# Patient Record
Sex: Female | Born: 1950
Health system: Southern US, Community
[De-identification: ages and names within clinical notes are randomized; demographics above are authoritative.]

## PROBLEM LIST (undated history)

## (undated) DIAGNOSIS — J189 Pneumonia, unspecified organism: Secondary | ICD-10-CM

## (undated) DIAGNOSIS — J449 Chronic obstructive pulmonary disease, unspecified: Secondary | ICD-10-CM

## (undated) DIAGNOSIS — M51369 Other intervertebral disc degeneration, lumbar region without mention of lumbar back pain or lower extremity pain: Secondary | ICD-10-CM

## (undated) DIAGNOSIS — G43909 Migraine, unspecified, not intractable, without status migrainosus: Secondary | ICD-10-CM

## (undated) DIAGNOSIS — J42 Unspecified chronic bronchitis: Secondary | ICD-10-CM

## (undated) DIAGNOSIS — J850 Gangrene and necrosis of lung: Secondary | ICD-10-CM

## (undated) DIAGNOSIS — M545 Low back pain, unspecified: Secondary | ICD-10-CM

## (undated) DIAGNOSIS — J3081 Allergic rhinitis due to animal (cat) (dog) hair and dander: Secondary | ICD-10-CM

## (undated) DIAGNOSIS — Z9289 Personal history of other medical treatment: Secondary | ICD-10-CM

## (undated) DIAGNOSIS — F329 Major depressive disorder, single episode, unspecified: Secondary | ICD-10-CM

## (undated) DIAGNOSIS — J439 Emphysema, unspecified: Secondary | ICD-10-CM

## (undated) DIAGNOSIS — I10 Essential (primary) hypertension: Secondary | ICD-10-CM

## (undated) DIAGNOSIS — R011 Cardiac murmur, unspecified: Secondary | ICD-10-CM

## (undated) DIAGNOSIS — G8929 Other chronic pain: Secondary | ICD-10-CM

## (undated) DIAGNOSIS — F419 Anxiety disorder, unspecified: Secondary | ICD-10-CM

## (undated) DIAGNOSIS — I5189 Other ill-defined heart diseases: Secondary | ICD-10-CM

## (undated) DIAGNOSIS — F32A Depression, unspecified: Secondary | ICD-10-CM

## (undated) DIAGNOSIS — Z720 Tobacco use: Secondary | ICD-10-CM

## (undated) DIAGNOSIS — Z9109 Other allergy status, other than to drugs and biological substances: Secondary | ICD-10-CM

## (undated) DIAGNOSIS — F319 Bipolar disorder, unspecified: Secondary | ICD-10-CM

## (undated) DIAGNOSIS — M5136 Other intervertebral disc degeneration, lumbar region: Secondary | ICD-10-CM

## (undated) DIAGNOSIS — J45909 Unspecified asthma, uncomplicated: Secondary | ICD-10-CM

## (undated) DIAGNOSIS — R0600 Dyspnea, unspecified: Secondary | ICD-10-CM

## (undated) DIAGNOSIS — R296 Repeated falls: Secondary | ICD-10-CM

## (undated) DIAGNOSIS — M1990 Unspecified osteoarthritis, unspecified site: Secondary | ICD-10-CM

## (undated) DIAGNOSIS — K219 Gastro-esophageal reflux disease without esophagitis: Secondary | ICD-10-CM

## (undated) HISTORY — DX: Depression, unspecified: F32.A

## (undated) HISTORY — DX: Anxiety disorder, unspecified: F41.9

## (undated) HISTORY — DX: Other ill-defined heart diseases: I51.89

## (undated) HISTORY — DX: Essential (primary) hypertension: I10

## (undated) HISTORY — DX: Major depressive disorder, single episode, unspecified: F32.9

## (undated) HISTORY — DX: Emphysema, unspecified: J43.9

## (undated) HISTORY — DX: Personal history of other medical treatment: Z92.89

## (undated) HISTORY — PX: DILATION AND CURETTAGE OF UTERUS: SHX78

## (undated) HISTORY — DX: Cardiac murmur, unspecified: R01.1

## (undated) HISTORY — DX: Chronic obstructive pulmonary disease, unspecified: J44.9

## (undated) HISTORY — PX: CAROTID STENT: SHX1301

## (undated) HISTORY — DX: Tobacco use: Z72.0

## (undated) HISTORY — DX: Unspecified asthma, uncomplicated: J45.909

---

## 1984-12-01 HISTORY — PX: TUBAL LIGATION: SHX77

## 1998-04-12 ENCOUNTER — Other Ambulatory Visit: Admission: RE | Admit: 1998-04-12 | Discharge: 1998-04-12 | Payer: Self-pay | Admitting: *Deleted

## 1999-07-11 ENCOUNTER — Other Ambulatory Visit: Admission: RE | Admit: 1999-07-11 | Discharge: 1999-07-11 | Payer: Self-pay | Admitting: *Deleted

## 2000-03-05 ENCOUNTER — Encounter: Payer: Self-pay | Admitting: *Deleted

## 2000-03-05 ENCOUNTER — Encounter: Admission: RE | Admit: 2000-03-05 | Discharge: 2000-03-05 | Payer: Self-pay | Admitting: *Deleted

## 2000-07-13 ENCOUNTER — Other Ambulatory Visit: Admission: RE | Admit: 2000-07-13 | Discharge: 2000-07-13 | Payer: Self-pay | Admitting: *Deleted

## 2002-01-27 ENCOUNTER — Other Ambulatory Visit: Admission: RE | Admit: 2002-01-27 | Discharge: 2002-01-27 | Payer: Self-pay | Admitting: *Deleted

## 2002-02-01 ENCOUNTER — Emergency Department (HOSPITAL_COMMUNITY): Admission: EM | Admit: 2002-02-01 | Discharge: 2002-02-01 | Payer: Self-pay

## 2003-03-09 ENCOUNTER — Other Ambulatory Visit: Admission: RE | Admit: 2003-03-09 | Discharge: 2003-03-09 | Payer: Self-pay | Admitting: *Deleted

## 2003-09-07 ENCOUNTER — Encounter: Admission: RE | Admit: 2003-09-07 | Discharge: 2003-09-07 | Payer: Self-pay | Admitting: Internal Medicine

## 2003-09-07 ENCOUNTER — Encounter: Payer: Self-pay | Admitting: Internal Medicine

## 2004-04-30 ENCOUNTER — Other Ambulatory Visit: Admission: RE | Admit: 2004-04-30 | Discharge: 2004-04-30 | Payer: Self-pay | Admitting: *Deleted

## 2004-08-15 DIAGNOSIS — Z9289 Personal history of other medical treatment: Secondary | ICD-10-CM

## 2004-08-15 HISTORY — DX: Personal history of other medical treatment: Z92.89

## 2005-08-28 ENCOUNTER — Other Ambulatory Visit: Admission: RE | Admit: 2005-08-28 | Discharge: 2005-08-28 | Payer: Self-pay | Admitting: *Deleted

## 2006-02-19 ENCOUNTER — Encounter: Admission: RE | Admit: 2006-02-19 | Discharge: 2006-02-19 | Payer: Self-pay | Admitting: Cardiology

## 2006-10-14 ENCOUNTER — Other Ambulatory Visit: Admission: RE | Admit: 2006-10-14 | Discharge: 2006-10-14 | Payer: Self-pay | Admitting: *Deleted

## 2006-11-14 ENCOUNTER — Emergency Department (HOSPITAL_COMMUNITY): Admission: EM | Admit: 2006-11-14 | Discharge: 2006-11-14 | Payer: Self-pay | Admitting: Emergency Medicine

## 2006-12-24 DIAGNOSIS — Z9289 Personal history of other medical treatment: Secondary | ICD-10-CM

## 2006-12-24 HISTORY — DX: Personal history of other medical treatment: Z92.89

## 2007-11-02 ENCOUNTER — Other Ambulatory Visit: Admission: RE | Admit: 2007-11-02 | Discharge: 2007-11-02 | Payer: Self-pay | Admitting: *Deleted

## 2008-01-06 ENCOUNTER — Encounter: Admission: RE | Admit: 2008-01-06 | Discharge: 2008-01-06 | Payer: Self-pay | Admitting: *Deleted

## 2009-06-27 ENCOUNTER — Encounter: Admission: RE | Admit: 2009-06-27 | Discharge: 2009-06-27 | Payer: Self-pay | Admitting: Internal Medicine

## 2009-07-17 ENCOUNTER — Encounter: Admission: RE | Admit: 2009-07-17 | Discharge: 2009-07-17 | Payer: Self-pay | Admitting: Internal Medicine

## 2010-04-05 ENCOUNTER — Emergency Department (HOSPITAL_COMMUNITY)
Admission: EM | Admit: 2010-04-05 | Discharge: 2010-04-05 | Payer: Self-pay | Source: Home / Self Care | Admitting: Emergency Medicine

## 2010-04-08 ENCOUNTER — Emergency Department (HOSPITAL_COMMUNITY)
Admission: EM | Admit: 2010-04-08 | Discharge: 2010-04-08 | Payer: Self-pay | Source: Home / Self Care | Admitting: Family Medicine

## 2010-04-12 ENCOUNTER — Emergency Department (HOSPITAL_COMMUNITY): Admission: EM | Admit: 2010-04-12 | Discharge: 2010-04-12 | Payer: Self-pay | Admitting: Family Medicine

## 2010-04-19 ENCOUNTER — Emergency Department (HOSPITAL_COMMUNITY): Admission: EM | Admit: 2010-04-19 | Discharge: 2010-04-19 | Payer: Self-pay | Admitting: Family Medicine

## 2010-12-22 ENCOUNTER — Encounter: Payer: Self-pay | Admitting: Internal Medicine

## 2011-02-05 ENCOUNTER — Ambulatory Visit (INDEPENDENT_AMBULATORY_CARE_PROVIDER_SITE_OTHER): Payer: Managed Care, Other (non HMO) | Admitting: Cardiology

## 2011-02-05 DIAGNOSIS — Z79899 Other long term (current) drug therapy: Secondary | ICD-10-CM

## 2011-02-05 DIAGNOSIS — I119 Hypertensive heart disease without heart failure: Secondary | ICD-10-CM

## 2011-04-30 ENCOUNTER — Telehealth: Payer: Self-pay | Admitting: Cardiology

## 2011-04-30 NOTE — Telephone Encounter (Signed)
CALL PT AT HOME #, SHE WANTS TO KNOW IF SOMETHING CAN BE CALLED IN FOR A COLD. CHART IN BOX.

## 2011-04-30 NOTE — Telephone Encounter (Signed)
Pt reports green drainage from nose. Feels like she has a sinus infection and has a productive cough with yellow secretions.  Discussed pts complaints with Dr Patty Sermons and he suggests Doxycycline 100mg  BID for 7 days. Pt informed of such and script called to [facility]

## 2011-05-08 ENCOUNTER — Encounter: Payer: Self-pay | Admitting: *Deleted

## 2011-05-08 ENCOUNTER — Telehealth: Payer: Self-pay | Admitting: Cardiology

## 2011-05-08 NOTE — Telephone Encounter (Signed)
CALL PT, SHE SAID SHE HAS FINISHED THE MED BUT CHEST AND ARMS STILL HURTING AND CONTINUED CONGESTION. SHE SAID THINKS SHE MAY NEED TO SEE DR BB. CHART IN BOX.

## 2011-05-08 NOTE — Telephone Encounter (Signed)
Dr Patty Sermons prescribed medication last week and still doesn't feel well.  Has had a fever, but does not think she has one now.  Still congested in chest and wanted to be seen.

## 2011-05-09 ENCOUNTER — Ambulatory Visit
Admission: RE | Admit: 2011-05-09 | Discharge: 2011-05-09 | Disposition: A | Discharge status: Home / Self Care | Payer: Managed Care, Other (non HMO) | Source: Ambulatory Visit | Attending: Cardiology | Admitting: Cardiology

## 2011-05-09 ENCOUNTER — Encounter: Payer: Self-pay | Admitting: Cardiology

## 2011-05-09 ENCOUNTER — Ambulatory Visit (INDEPENDENT_AMBULATORY_CARE_PROVIDER_SITE_OTHER): Payer: Managed Care, Other (non HMO) | Admitting: Cardiology

## 2011-05-09 VITALS — BP 120/80 | HR 91 | Ht 63.0 in | Wt 148.6 lb

## 2011-05-09 DIAGNOSIS — I119 Hypertensive heart disease without heart failure: Secondary | ICD-10-CM

## 2011-05-09 DIAGNOSIS — J40 Bronchitis, not specified as acute or chronic: Secondary | ICD-10-CM | POA: Insufficient documentation

## 2011-05-09 DIAGNOSIS — F329 Major depressive disorder, single episode, unspecified: Secondary | ICD-10-CM

## 2011-05-09 DIAGNOSIS — R079 Chest pain, unspecified: Secondary | ICD-10-CM | POA: Insufficient documentation

## 2011-05-09 DIAGNOSIS — F39 Unspecified mood [affective] disorder: Secondary | ICD-10-CM | POA: Insufficient documentation

## 2011-05-09 DIAGNOSIS — F32A Depression, unspecified: Secondary | ICD-10-CM

## 2011-05-09 DIAGNOSIS — F3289 Other specified depressive episodes: Secondary | ICD-10-CM

## 2011-05-09 DIAGNOSIS — R0789 Other chest pain: Secondary | ICD-10-CM

## 2011-05-09 DIAGNOSIS — M1990 Unspecified osteoarthritis, unspecified site: Secondary | ICD-10-CM

## 2011-05-09 DIAGNOSIS — E785 Hyperlipidemia, unspecified: Secondary | ICD-10-CM

## 2011-05-09 MED ORDER — AZITHROMYCIN 250 MG PO TABS: ORAL_TABLET | ORAL | Status: AC

## 2011-05-09 NOTE — Progress Notes (Signed)
Catherine Kelley Date of Birth:  [birth year] [facility] / [facility] [address]   [address] [place], [place]  [address] [phone]           Fax   [phone]  HPI: This pleasant 60 year old woman is seen for a work in office visit.  She comes in because of symptoms of acute bronchitis and symptoms of left-sided pleurisy chest pain.  It is difficult for her to take along breath without coughing.  She has been coughing up some greenish sputum.  Her last chest x-ray was several years ago.  She does smoke cigarettes.  She does not have any history of ischemic heart disease.  She had a normal stress Cardiolite study on 08/15/04 she had a echocardiogram on 12/24/06 which showed normal left ventricular systolic function with an ejection fraction of 55-60% and with impaired relaxation and with mild aortic sclerosis and normal pulmonary artery pressure.  Current Outpatient Prescriptions  Medication Sig Dispense Refill  . ALPRAZolam (XANAX PO) Take by mouth. 1/2 tablet at bedtime       . aspirin 81 MG tablet Take 81 mg by mouth daily.        Tery Sanfilippo Calcium (STOOL SOFTENER PO) Take 1 tablet by mouth as needed.        [name] losartan-hydrochlorothiazide (HYZAAR) 50-12.5 MG per tablet Take 1 tablet by mouth daily.        . nadolol (CORGARD) 40 MG tablet Take 20 mg by mouth daily.        [name] NORTRIPTYLINE HCL PO Take 2 tablets by mouth at bedtime.        [name] azithromycin (ZITHROMAX) 250 MG tablet Take 2 tablets by mouth on day 1, followed by 1 tablet by mouth daily for 4 days. As directed  6 each  0  . theophylline (THEOPHYLLINE) 300 MG 12 hr tablet Take 300 mg by mouth as needed.        [name] DISCONTD: calcium carbonate (OS-CAL) 600 MG TABS Take 600 mg by mouth daily.        [name] DISCONTD: Cholecalciferol (VITAMIN D) 1000 UNITS capsule Take 1,000 Units by mouth daily.        [name] DISCONTD: Furosemide (LASIX PO) Take 1 tablet by mouth as needed.          Allergies  Allergen Reactions  . Ceclor  (Cefaclor)   . Lexapro   . Penicillins   . Sulfa Drugs Cross Reactors   . Zoloft     Patient Active Problem List  Diagnoses  . Benign hypertensive heart disease without heart failure  . Chest pain  . Dyslipidemia  . Depression  . Osteoarthritis  . Bronchitis    History  Smoking status  . Current Everyday Smoker -- 0.5 packs/day for 46 years  . Types: Cigarettes  Smokeless tobacco  . Not on file    History  Alcohol Use No    No family history on file.  Review of Systems: The patient denies any heat or cold intolerance.  No weight gain or weight loss.  The patient denies headaches or blurry vision.  There is no cough or sputum production.  The patient denies dizziness.  There is no hematuria or hematochezia.  The patient denies any muscle aches or arthritis.  The patient denies any rash.  The patient denies frequent falling or instability.  There is no history of depression or anxiety.  All other systems were reviewed and are negative.   Physical Exam:  Filed Vitals:   05/09/11 1158  BP: 120/80  Pulse: 91  The general appearance reveals a well-developed well-nourished Caucasian female in no acute distress.  Pupils equal and reactive.   Extraocular Movements are full.  There is no scleral icterus.  The mouth and pharynx are normal.  The neck is supple.  The carotids reveal no bruits.  The jugular venous pressure is normal.  The thyroid is not enlarged.  There is no lymphadenopathy.The chest reveals scattered rhonchi particularly at the left base.The precordium is quiet.  The first heart sound is normal.  The second heart sound is physiologically split.  There is no murmur gallop rub or click.  There is no abnormal lift or heave. The breasts reveal no masses.The abdomen is soft and nontender. Bowel sounds are normal. The liver and spleen are not enlarged. There Are no abdominal masses. There are no bruits.  Normal extremity without phlebitis or edema.  Pedal pulses are good.The  skin is warm and dry.  There is no rash.  EKG shows normal sinus rhythm and no ischemic changes.    Assessment / Plan: We are sending the patient for a chest x-ray.  She is to refill her plain Mucinex and take it twice a day and we also phoned in a prescription for Zithromax Z-Pak.  She was urged to quit smoking altogether.  Recheck at regular followup office visit or p.r.n.

## 2011-05-09 NOTE — Assessment & Plan Note (Signed)
The patient has had another episode of acute bronchitis.  He began and sinusitis and moved down into her chest.  Patient does smoke cigarettes

## 2011-05-09 NOTE — Assessment & Plan Note (Signed)
This pleasant 60 year old woman is seen as a work in Because of left-sided chest pain.She has had a bad upper respiratory infection for several weeks.  She has had one round of Zithromax followed by a round of doxycycline.  She is still coughing up greenish sputum.  She feels like the Zithromax helped her more than the doxycycline.  For the past 2 days she's had left-sided pleuritic-type chest pain which has been very sharp and severe.

## 2011-05-10 ENCOUNTER — Telehealth: Payer: Self-pay | Admitting: Nurse Practitioner

## 2011-05-10 NOTE — Telephone Encounter (Signed)
Pt called saying that she was seen by dr. Patty Sermons yesterday w/ diagnosis of pleurisy.  She's been placed on abx but has noted some wheezing.  She has used ventolin hfa in past for this.  i called in ventolin hfa 24mcg/spray, 2 puffs q 4-6h prn wheezing, #1, 2 refills [facility] @ [phone].

## 2011-05-11 ENCOUNTER — Other Ambulatory Visit: Payer: Self-pay | Admitting: Cardiology

## 2011-05-11 DIAGNOSIS — I10 Essential (primary) hypertension: Secondary | ICD-10-CM

## 2011-05-12 ENCOUNTER — Telehealth: Payer: Self-pay | Admitting: Cardiology

## 2011-05-12 DIAGNOSIS — IMO0002 Reserved for concepts with insufficient information to code with codable children: Secondary | ICD-10-CM

## 2011-05-12 NOTE — Telephone Encounter (Signed)
Spoke with patient and she stated her soreness in chest had moved to the other side, but doesn't really think she is worse.  Will use tylenol for soreness.  Stated she has one more day of zithromax.  Did advise of xray results and the need for repeat chest xray with nipple markers.  States she is off on 6/13 and she will get then.  Will finish zithromax and will touch base after repeat xray results are back

## 2011-05-12 NOTE — Telephone Encounter (Signed)
Patient was told that she should call if no better this weekend. She said that she is feeling worse and wants to speak w/RN

## 2011-05-12 NOTE — Telephone Encounter (Signed)
Agree with plan 

## 2011-05-12 NOTE — Telephone Encounter (Signed)
Message copied by Burnell Blanks on Mon May 12, 2011 12:57 PM ------      Message from: Cassell Clement      Created: Mon May 12, 2011 12:56 PM       We will order repeat PA chest x-ray with nipple markers

## 2011-05-12 NOTE — Telephone Encounter (Signed)
escribe request  

## 2011-05-12 NOTE — Telephone Encounter (Signed)
Advised and ordered

## 2011-05-14 ENCOUNTER — Telehealth: Payer: Self-pay | Admitting: Cardiology

## 2011-05-14 ENCOUNTER — Ambulatory Visit
Admission: RE | Admit: 2011-05-14 | Discharge: 2011-05-14 | Disposition: A | Discharge status: Home / Self Care | Payer: Managed Care, Other (non HMO) | Source: Ambulatory Visit | Attending: Cardiology | Admitting: Cardiology

## 2011-05-14 ENCOUNTER — Encounter: Payer: Self-pay | Admitting: Cardiology

## 2011-05-14 DIAGNOSIS — IMO0002 Reserved for concepts with insufficient information to code with codable children: Secondary | ICD-10-CM

## 2011-05-14 NOTE — Telephone Encounter (Signed)
Note written and signed by Dr. Patty Sermons

## 2011-05-14 NOTE — Telephone Encounter (Signed)
Patient was here/seen last Friday and was out of work this past Monday and Tuesday.  She would like to come in to pick up a note from Dr.Brackbill excusing her from work those two days.

## 2011-05-14 NOTE — Telephone Encounter (Signed)
Left message ready to pick up

## 2011-05-15 ENCOUNTER — Telehealth: Payer: Self-pay | Admitting: Cardiology

## 2011-05-15 NOTE — Telephone Encounter (Signed)
Advised patient of CXR

## 2011-05-15 NOTE — Telephone Encounter (Signed)
PT SAID RETURNING MELINDA'S PHONE CALL. COULD NOT REACH MELINDA.

## 2011-05-22 ENCOUNTER — Telehealth: Payer: Self-pay | Admitting: Cardiology

## 2011-05-22 NOTE — Telephone Encounter (Signed)
States she just does not feel good.  Still having colored sputum and no energy.  Advised Dr Patty Sermons out of the office this week.  Did explain to her this was not a cardiac issue and she really needed to call her PCP for these types of problems.  Verbalized understanding.  Did state she would call back next week if not satisfied with PCP

## 2011-08-13 ENCOUNTER — Ambulatory Visit (INDEPENDENT_AMBULATORY_CARE_PROVIDER_SITE_OTHER): Payer: Managed Care, Other (non HMO) | Admitting: Cardiology

## 2011-08-13 ENCOUNTER — Encounter: Payer: Self-pay | Admitting: Cardiology

## 2011-08-13 VITALS — BP 110/80 | HR 82 | Wt 148.0 lb

## 2011-08-13 DIAGNOSIS — J40 Bronchitis, not specified as acute or chronic: Secondary | ICD-10-CM

## 2011-08-13 DIAGNOSIS — Z720 Tobacco use: Secondary | ICD-10-CM

## 2011-08-13 DIAGNOSIS — F17200 Nicotine dependence, unspecified, uncomplicated: Secondary | ICD-10-CM

## 2011-08-13 DIAGNOSIS — I119 Hypertensive heart disease without heart failure: Secondary | ICD-10-CM

## 2011-08-13 DIAGNOSIS — J449 Chronic obstructive pulmonary disease, unspecified: Secondary | ICD-10-CM

## 2011-08-13 NOTE — Assessment & Plan Note (Signed)
At the present time the patient is not having any symptoms of active bronchitis.  She still has a slight cough but no significant sputum production.  She continues to smoke a half pack of cigarettes per day

## 2011-08-13 NOTE — Progress Notes (Signed)
Catherine Kelley Date of Birth:  Nov 19, 1951 [facility] / [facility] [address]   [address] [place], [place]  [address] [phone]           Fax   [phone]  History of Present Illness: This pleasant 60 year old Caucasian female is seen for a scheduled followup office visit.  She has a past history of mild essential hypertension and a past history of chronic obstructive pulmonary disease and recurrent episodes of bronchitis.  She continues to smoke about half a pack a day.  We gave her smoking cessation counseling today.  Since we last saw her in June she has recovered from the severe left-sided pleuritic chest pain that she developed in association with symptoms of acute bronchitis.  Patient does not have any history of ischemic heart disease.  She had a normal stress Cardiolite study on 08/15/04.  She had an echocardiogram on 12/24/06 showed normal left ventricular systolic function and normal pulmonary artery pressure.  She does have diastolic dysfunction.  Current Outpatient Prescriptions  Medication Sig Dispense Refill  . ALPRAZolam (XANAX PO) Take by mouth. 1/2 tablet at bedtime       . aspirin 81 MG tablet Take 81 mg by mouth daily.        Tery Sanfilippo Calcium (STOOL SOFTENER PO) Take 1 tablet by mouth as needed.        [name] losartan-hydrochlorothiazide (HYZAAR) 50-12.5 MG per tablet TAKE 1 TABLET EVERY DAY  30 tablet  11  . nadolol (CORGARD) 40 MG tablet Take 20 mg by mouth daily.        [name] NORTRIPTYLINE HCL PO Take 2 tablets by mouth at bedtime.        . theophylline (THEOPHYLLINE) 300 MG 12 hr tablet Take 300 mg by mouth as needed.          Allergies  Allergen Reactions  . Ceclor (Cefaclor)   . Lexapro   . Penicillins   . Sulfa Drugs Cross Reactors   . Zoloft     Patient Active Problem List  Diagnoses  . Benign hypertensive heart disease without heart failure  . Chest pain  . Dyslipidemia  . Depression  . Osteoarthritis  . Bronchitis    History  Smoking  status  . Current Everyday Smoker -- 0.5 packs/day for 46 years  . Types: Cigarettes  Smokeless tobacco  . Not on file    History  Alcohol Use No    No family history on file.  Review of Systems: Constitutional: no fever chills diaphoresis or fatigue or change in weight.  Head and neck: no hearing loss, no epistaxis, no photophobia or visual disturbance. Respiratory: No cough, shortness of breath or wheezing. Cardiovascular: No chest pain peripheral edema, palpitations. Gastrointestinal: No abdominal distention, no abdominal pain, no change in bowel habits hematochezia or melena. Genitourinary: No dysuria, no frequency, no urgency, no nocturia. Musculoskeletal:No arthralgias, no back pain, no gait disturbance or myalgias. Neurological: No dizziness, no headaches, no numbness, no seizures, no syncope, no weakness, no tremors. Hematologic: No lymphadenopathy, no easy bruising. Psychiatric: No confusion, no hallucinations, no sleep disturbance.    Physical Exam: Filed Vitals:   08/13/11 1414  BP: 110/80  Pulse: 82  The general appearance reveals a well-developed well-nourished woman in no distress.Pupils equal and reactive.   Extraocular Movements are full.  There is no scleral icterus.  The mouth and pharynx are normal.  The neck is supple.  The carotids reveal no bruits.  The jugular venous  pressure is normal.  The thyroid is not enlarged.  There is no lymphadenopathy.    The chest reveals coarse rhonchi bilaterally but less severe than on previous visits.  There is no pleural friction rub. The precordium is quiet.  The first heart sound is normal.  The second heart sound is physiologically split.  There is no murmur gallop rub or click.  There is no abnormal lift or heave.  The abdomen is soft and nontender. Bowel sounds are normal. The liver and spleen are not enlarged. There Are no abdominal masses. There are no bruits.  The pedal pulses are good.  There is no phlebitis or  edema.  There is no cyanosis or clubbing.  Strength is normal and symmetrical in all extremities.  There is no lateralizing weakness.  There are no sensory deficits.  The skin is warm and dry.  There is no rash.     Assessment / Plan: We counseled on smoking cessation.  Continue meds the same.  Recheck in 4 months for followup office visit.  Continue to avoid excessive salt in diet .

## 2011-08-13 NOTE — Patient Instructions (Signed)
Smoking Cessation, Tips for Success YOU CAN QUIT SMOKING If you are ready to quit smoking, congratulations! You have chosen to help yourself be healthier. Cigarettes bring nicotine, tar, carbon monoxide, and other irritants into your body. Your lungs, heart, and blood vessels will be able to work better without these poisons. There are lots of different ways to quit smoking. Nicotine gum, nicotine patches, a nicotine inhaler, or nicotine nasal spray help with physical craving. Hypnosis, support groups, and medicines help break the habit of smoking. Here are some tips to help you quit for good. 1. Throw away all cigarettes.  2. Clean and remove all ashtrays from your home, work, and car.  3. On a card, write down your reasons for quitting. Carry the card with you and read it when you get the urge to smoke.  4. Cleanse your body of nicotine. Drink enough water and fluids to keep your urine clear or pale yellow. Do this after quitting to flush the nicotine from your body.  5. Learn to predict your moods. Do not let a bad situation be your excuse to have a cigarette. Some situations in your life might tempt you into wanting a cigarette.  6. Never have "just one" cigarette. It leads to wanting another and another. Remind yourself of your decision to quit.  7. Change habits associated with smoking. If you smoked while driving or when feeling stressed, try other activities to replace smoking. Stand up when drinking your coffee. Brush your teeth after eating. Sit in a different chair when you read the paper. Avoid alcohol while trying to quit, and try to drink fewer caffeinated beverages. Alcohol and caffeine may urge you to smoke.  8. Avoid foods and drinks that can trigger a desire to smoke, such as sugary or spicy foods and alcohol.  9. Ask people who smoke not to smoke around you.  10. Have something planned to do right after eating or having a cup of coffee. Take a walk or exercise to perk you up. This will  help to keep you from overeating.  11. Try a relaxation exercise to calm you down and decrease your stress. Remember, you may be tense and nervous in the first 2 weeks after you quit, but this will pass.  12. Find new activities to keep your hands busy. Play with a pen, coin, or rubber band. Doodle or draw things on paper.  13. Brush your teeth right after eating. This will help cut down on the craving for the taste of tobacco after meals. You can try mouthwash, too.  14. Use oral substitutes, such as lemon drops, carrots, a cinnamon stick, or chewing gum, in place of cigarettes. Keep them handy so they are available when you have the urge to smoke.  15. When you have the urge to smoke, try deep breathing.  16. Designate your home as a nonsmoking area.  17. If you are a heavy smoker, ask your caregiver about a prescription for nicotine chewing gum. It can ease your withdrawal from nicotine.  18. Reward yourself. Set aside the cigarette money you save and buy yourself something nice.  19. Look for support from others. Join a support group or smoking cessation program. Ask someone at home or at work to help you with your plan to quit smoking.  20. Always ask yourself, "Do I need this cigarette or is this just a reflex?" Tell yourself, "Today, I choose not to smoke," or "I do not want to smoke." You are  reminding yourself of your decision to quit, even if you do smoke a cigarette.  HOW WILL I FEEL WHEN I QUIT SMOKING?  The benefits of not smoking start within days of quitting.   You may have symptoms of withdrawal because your body is used to nicotine (the addictive substance in cigarettes). You may crave cigarettes, be irritable, feel very hungry, cough often, get headaches, or have difficulty concentrating.   The withdrawal symptoms are only temporary. They are strongest when you first quit but will go away within 10 to 14 days.   When withdrawal symptoms occur, stay in control. Think about your  reasons for quitting. Remind yourself that these are signs that your body is healing and getting used to being without cigarettes.   Remember that withdrawal symptoms are easier to treat than the major diseases that smoking can cause.   Even after the withdrawal is over, expect periodic urges to smoke. However, these cravings are generally short-lived and will go away whether you smoke or not. Do not smoke!   If you relapse and smoke again, do not lose hope. Of the people who quit, 75% relapse. Most smokers quit 3 times before they are successful.   If you relapse, do not give up! Plan ahead and think about what you will do the next time you get the urge to smoke.  LIFE AS A NONSMOKER: MAKE IT FOR A MONTH, MAKE IT FOR LIFE Day 1 Hang this page where you will see it every day. Day 2 Get rid of all ashtrays, matches, and lighters. Day 3 Drink water. Breathe deeply between sips. Day 4 Avoid places with smoke-filled air, such as bars, clubs, or the smoking section of restaurants. Day 5 Keep track of how much money you save by not smoking. Day 6 Avoid boredom. Keep a good book with you or go to the movies. Day 7 Reward yourself! One week without smoking! Day 8 Make a dental appointment to get your teeth cleaned. Day 9 Decide how you will turn down a cigarette before it is offered to you. Day 10 Review your reasons for quitting. Day 11 Distract yourself. Stay active to keep your mind off smoking and to relieve tension. Take a walk, exercise, read a book, do a crossword puzzle, or try a new hobby. Day 12 Exercise. Get off the bus before your stop or use stairs instead of escalators. Day 13 Call on friends for support and encouragement. Day 14 Reward yourself! Two weeks without smoking! Day 15 Practice deep breathing exercises. Day 16 Bet a friend that you can stay a nonsmoker. Day 17 Ask to sit in nonsmoking sections of restaurants. Day 18 Hang up "No Smoking" signs. Day 19 Think of yourself as  a nonsmoker. Day 20 Each morning, tell yourself you will not smoke. Day 21 Reward yourself! Three weeks without smoking! Day 22 Think of smoking in negative ways.   Remember how it stains your teeth, gives you bad breath, and shortens your breath. Day 23 Eat a nutritious breakfast. Day 24 Do not relive your days as a smoker. Day 25 Hold a pencil in your hand when talking on the telephone. Day 26 Tell all your friends you do not smoke. Day 27 Think about how much better food tastes. Day 28 Remember, one cigarette is one too many. Day 29 Take up a hobby that will keep your hands busy. Day 30 Congratulations! One month without smoking! Give yourself a big reward. Your caregiver can direct you  to community resources or hospitals for support, which may include:  Group support.   Education.   Hypnosis.   Subliminal therapy.  Document Released: 08/15/2004 Document Re-Released: 02/11/2010 [facility] Patient Information 2011 [place], [place].

## 2011-08-13 NOTE — Assessment & Plan Note (Signed)
The patient is not having any symptoms referable to her blood pressure.  She denies dizziness or syncope or exertional chest pain.

## 2011-08-27 ENCOUNTER — Other Ambulatory Visit: Payer: Self-pay | Admitting: Internal Medicine

## 2011-08-27 DIAGNOSIS — Z1231 Encounter for screening mammogram for malignant neoplasm of breast: Secondary | ICD-10-CM

## 2011-09-08 ENCOUNTER — Ambulatory Visit: Payer: Managed Care, Other (non HMO)

## 2011-09-24 ENCOUNTER — Ambulatory Visit: Payer: Managed Care, Other (non HMO)

## 2011-10-08 ENCOUNTER — Ambulatory Visit
Admission: RE | Admit: 2011-10-08 | Discharge: 2011-10-08 | Disposition: A | Discharge status: Home / Self Care | Payer: Managed Care, Other (non HMO) | Source: Ambulatory Visit | Attending: Internal Medicine | Admitting: Internal Medicine

## 2011-10-08 DIAGNOSIS — Z1231 Encounter for screening mammogram for malignant neoplasm of breast: Secondary | ICD-10-CM

## 2012-01-06 ENCOUNTER — Other Ambulatory Visit: Payer: Self-pay | Admitting: *Deleted

## 2012-01-06 MED ORDER — NADOLOL 40 MG PO TABS: 20.0000 mg | ORAL_TABLET | Freq: Every day | ORAL | Status: DC

## 2012-03-08 ENCOUNTER — Telehealth: Payer: Self-pay | Admitting: Cardiology

## 2012-03-08 NOTE — Telephone Encounter (Signed)
Left message at home, will try work

## 2012-03-08 NOTE — Telephone Encounter (Signed)
Please return call to patient at hm# [phone] until 11am  Patient would like to speak with nurse who is having a lot of problems with allergies.  She can be reached at hm# [phone] until 11 am this morning.

## 2012-03-08 NOTE — Telephone Encounter (Signed)
Tried to call patient at work number, entered ext. And it didn't ring her.  Will wait for her to call tomorrow or will try her in am

## 2012-03-09 ENCOUNTER — Encounter: Payer: Self-pay | Admitting: Nurse Practitioner

## 2012-03-09 ENCOUNTER — Ambulatory Visit (INDEPENDENT_AMBULATORY_CARE_PROVIDER_SITE_OTHER): Payer: Managed Care, Other (non HMO) | Admitting: Nurse Practitioner

## 2012-03-09 ENCOUNTER — Encounter (INDEPENDENT_AMBULATORY_CARE_PROVIDER_SITE_OTHER): Payer: Managed Care, Other (non HMO)

## 2012-03-09 VITALS — BP 148/100 | HR 78 | Ht 63.0 in | Wt 138.0 lb

## 2012-03-09 DIAGNOSIS — R600 Localized edema: Secondary | ICD-10-CM | POA: Insufficient documentation

## 2012-03-09 DIAGNOSIS — R609 Edema, unspecified: Secondary | ICD-10-CM

## 2012-03-09 DIAGNOSIS — I119 Hypertensive heart disease without heart failure: Secondary | ICD-10-CM

## 2012-03-09 LAB — CBC WITH DIFFERENTIAL/PLATELET
Basophils Absolute: 0 10*3/uL (ref 0.0–0.1)
Basophils Relative: 0.5 % (ref 0.0–3.0)
Eosinophils Absolute: 0.2 10*3/uL (ref 0.0–0.7)
Eosinophils Relative: 5 % (ref 0.0–5.0)
HCT: 39.8 % (ref 36.0–46.0)
Hemoglobin: 13.2 g/dL (ref 12.0–15.0)
Lymphocytes Relative: 41.3 % (ref 12.0–46.0)
Lymphs Abs: 1.9 10*3/uL (ref 0.7–4.0)
MCHC: 33.1 g/dL (ref 30.0–36.0)
MCV: 92.1 fl (ref 78.0–100.0)
Monocytes Absolute: 0.5 10*3/uL (ref 0.1–1.0)
Monocytes Relative: 11.4 % (ref 3.0–12.0)
Neutro Abs: 1.9 10*3/uL (ref 1.4–7.7)
Neutrophils Relative %: 41.8 % — ABNORMAL LOW (ref 43.0–77.0)
Platelets: 154 10*3/uL (ref 150.0–400.0)
RBC: 4.32 Mil/uL (ref 3.87–5.11)
RDW: 17 % — ABNORMAL HIGH (ref 11.5–14.6)
WBC: 4.6 10*3/uL (ref 4.5–10.5)

## 2012-03-09 LAB — BASIC METABOLIC PANEL
BUN: 14 mg/dL (ref 6–23)
CO2: 26 mEq/L (ref 19–32)
Calcium: 8.7 mg/dL (ref 8.4–10.5)
Chloride: 99 mEq/L (ref 96–112)
Creatinine, Ser: 0.4 mg/dL (ref 0.4–1.2)
GFR: 162.72 mL/min (ref >60.00)
Glucose, Bld: 108 mg/dL — ABNORMAL HIGH (ref 70–99)
Potassium: 4.1 mEq/L (ref 3.5–5.1)
Sodium: 134 mEq/L — ABNORMAL LOW (ref 135–145)

## 2012-03-09 MED ORDER — CIPROFLOXACIN HCL 250 MG PO TABS: 250.0000 mg | ORAL_TABLET | Freq: Two times a day (BID) | ORAL | Status: DC

## 2012-03-09 MED ORDER — FUROSEMIDE 20 MG PO TABS: 20.0000 mg | ORAL_TABLET | Freq: Every day | ORAL | Status: DC

## 2012-03-09 NOTE — Assessment & Plan Note (Signed)
Blood pressure is up today. Lasix has been added for a week. I have asked her to cut back on her sodium consumption.

## 2012-03-09 NOTE — Telephone Encounter (Signed)
Has appointment with Lawson Fiscal NP today for swelling in feet

## 2012-03-09 NOTE — Assessment & Plan Note (Addendum)
Patient presents with worsening edema, right greater than left with some early cellulitis like changes. Will check venous dopplers today. Will give her a week's of Lasix 20 mg daily and Cipro 250 mg BID. She is to restrict her salt. She will call and give Juliette Alcide an update next week. She does have diastolic dysfunction and if she fails to improve on this therapy, would consider repeating her echo. Patient is agreeable to this plan and will call if any problems develop in the interim.

## 2012-03-09 NOTE — Progress Notes (Signed)
Catherine Kelley Date of Birth: 25-Nov-1951 Medical Record #[id number]  History of Present Illness: Catherine Kelley is seen today for a work in visit. She is seen for Dr. Patty Sermons. She has HTN, COPD with ongoing tobacco abuse and a history of depression. She comes into today due to progressive swelling of her legs, especially on the right. Her legs are getting red. The swelling is not going down overnight. She has not had fever or chills but has a general sense of malaise. Salt use is pretty liberal. No recent travel reported. Her weight is actually down which she attributes to a bout of depression earlier this year. She continues to smoke. She has had 2 falls in the last month due to getting tripped up in her feet. No syncope. No chest pain.   Current Outpatient Prescriptions on File Prior to Visit  Medication Sig Dispense Refill  . ALPRAZolam (XANAX PO) Take by mouth. 1/2 tablet at bedtime       . aspirin 81 MG tablet Take 81 mg by mouth daily.        Tery Sanfilippo Calcium (STOOL SOFTENER PO) Take 1 tablet by mouth as needed.        [name] losartan-hydrochlorothiazide (HYZAAR) 50-12.5 MG per tablet TAKE 1 TABLET EVERY DAY  30 tablet  11  . nadolol (CORGARD) 40 MG tablet Take 0.5 tablets (20 mg total) by mouth daily.  90 tablet  3  . NORTRIPTYLINE HCL PO Take 2 tablets by mouth at bedtime.        . furosemide (LASIX) 20 MG tablet Take 1 tablet (20 mg total) by mouth daily.  30 tablet  11    Allergies  Allergen Reactions  . Ceclor (Cefaclor)   . Lexapro   . Penicillins   . Sulfa Drugs Cross Reactors   . Zoloft     Past Medical History  Diagnosis Date  . Hypertension     essential hypertension  . Tobacco abuse     smoking about 1/2 pk of cigarettes a day  . Anxiety   . Depression     psychiatrist Dr. Evelene Croon  . Respiratory infection   . Shortness of breath   . Malaise   . Lightheadedness   . Emphysematous COPD     changes in right base along with patchy areas on last x-ray  . COPD (chronic  obstructive pulmonary disease)   . Hypertriglyceridemia   . History of echocardiogram 12/24/2006    Est. EF of 55-60% NORMAL LV SYSTOLIC FUNCTION WITH IMPAIRED RELAXATION -- MILD AORTIC SCLEROSIS -- NORMAL PALONARY ARTERY PRESSURE -- NO OLD ECHOS FOR COMPARISON -- Cassell Clement, MD  . History of cardiovascular stress test 08/15/2004    EF of 70% -- Normal stress cardiolite.  There is no evidence of ischemia and there is normal LV function. -- Maisie Fus A. Brackbill. MD  . Diastolic dysfunction     History reviewed. No pertinent past surgical history.  History  Smoking status  . Current Everyday Smoker -- 0.5 packs/day for 46 years  . Types: Cigarettes  Smokeless tobacco  . Not on file    History  Alcohol Use No    History reviewed. No pertinent family history.  Review of Systems: The review of systems is positive for allergies and some wheezing.  All other systems were reviewed and are negative.  Physical Exam: BP 148/100  Pulse 78  Ht 5\' 3"  (1.6 m)  Wt 138 lb (62.596 kg)  BMI 24.45 kg/m2 Patient is very  pleasant and in no acute distress. Seems a little anxious. Skin is warm and dry. Color is normal.  HEENT is unremarkable. Normocephalic/atraumatic. PERRL. Sclera are nonicteric. Neck is supple. No masses. No JVD. Lungs are clear. Cardiac exam shows a regular rate and rhythm. Abdomen is soft. Extremities are with 1+ edema, right greater than left. There is more redness and a little warmth in both lower legs, but especially on the right. Gait and ROM are intact. No gross neurologic deficits noted.  LABORATORY DATA: PENDING   Assessment / Plan:

## 2012-03-09 NOTE — Patient Instructions (Signed)
We are going to check some dopplers on your legs to rule out blood clots.  Take the Lasix 20 mg each morning for one week only then stop  Take the Cipro 250 mg two times a day for one week  Call Juliette Alcide next Monday and let her know how you are  We are going to check some labs today  Cut back on your salt  Call the [facility] office at [phone] if you have any questions, problems or concerns.

## 2012-03-12 ENCOUNTER — Telehealth: Payer: Self-pay | Admitting: Cardiology

## 2012-03-12 NOTE — Telephone Encounter (Signed)
Pt was seen 4-9, was given abx and fluid pill, legs and feet still swollen, very red , no changes from med,  pls advise

## 2012-03-12 NOTE — Telephone Encounter (Signed)
Patient states that on 03/09/12 Norma Fredrickson NP  order Cipro antibiotics and lasix 20 mg once a day for LE swelling and reddnes, she denies SOB. Patient thinks that the antibiotic is not strong enough, she may need a stronger antibiotic. Patient was made aware that it may need to give the medication  time to  work, also pt to make sure to keep legs elevated when sitting up, And in bed. pt is to make sure and  read the label for low sodium of soft drinks and food. Patient to call Juliette Alcide on Monday 03/15/12   To let her know how  she is doing. Pt to go to the ER is symptoms worsen.

## 2012-03-15 ENCOUNTER — Ambulatory Visit (INDEPENDENT_AMBULATORY_CARE_PROVIDER_SITE_OTHER): Payer: Managed Care, Other (non HMO) | Admitting: Cardiology

## 2012-03-15 ENCOUNTER — Encounter: Payer: Self-pay | Admitting: Cardiology

## 2012-03-15 ENCOUNTER — Ambulatory Visit: Payer: Managed Care, Other (non HMO) | Admitting: Cardiology

## 2012-03-15 VITALS — BP 128/78 | HR 80 | Ht 63.0 in | Wt 136.0 lb

## 2012-03-15 DIAGNOSIS — I119 Hypertensive heart disease without heart failure: Secondary | ICD-10-CM

## 2012-03-15 DIAGNOSIS — R609 Edema, unspecified: Secondary | ICD-10-CM

## 2012-03-15 DIAGNOSIS — L0291 Cutaneous abscess, unspecified: Secondary | ICD-10-CM

## 2012-03-15 DIAGNOSIS — R600 Localized edema: Secondary | ICD-10-CM

## 2012-03-15 DIAGNOSIS — L0390 Cellulitis, unspecified: Secondary | ICD-10-CM

## 2012-03-15 MED ORDER — CEPHALEXIN 500 MG PO CAPS: 500.0000 mg | ORAL_CAPSULE | Freq: Three times a day (TID) | ORAL | Status: DC

## 2012-03-15 NOTE — Assessment & Plan Note (Signed)
The patient has not responded adequately to Cipro and she would like to try something different.  Patient has a history of allergy to penicillin but she has been able to take Keflex successfully in the past.  We will therefore stop the Cipro and switched to generic Keflex 500 mg 3 times a day.

## 2012-03-15 NOTE — Progress Notes (Signed)
Catherine Kelley Date of Birth:  November 08, 1951 [facility] [address] [place], [place]  [address] [phone]         Fax   [phone]  History of Present Illness: This pleasant 61 year old woman is seen back for a work in followup office visit.  She had been seen here in the clinic on 03/09/12 for complaints of swelling and redness of her right leg she had a Doppler of her right leg which showed no evidence of deep vein thrombosis on that day.  She was felt to have probable cellulitis and was begun on Cipro.  She returns today for followup.  She does not feel that she has improved significantly since last week.  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  The edema has improved on a short one-week course of low-dose furosemide but the edema has not totally subsided.  She still has some tenderness in the right lower leg.  Current Outpatient Prescriptions  Medication Sig Dispense Refill  . Albuterol (VENTOLIN IN) Inhale into the lungs. As directed      . ALPRAZolam (XANAX PO) Take by mouth. 1/2 tablet at bedtime       . aspirin 81 MG tablet Take 81 mg by mouth daily.        . furosemide (LASIX) 20 MG tablet Take 1 tablet (20 mg total) by mouth daily.  30 tablet  11  . losartan-hydrochlorothiazide (HYZAAR) 50-12.5 MG per tablet TAKE 1 TABLET EVERY DAY  30 tablet  11  . nadolol (CORGARD) 40 MG tablet Take 0.5 tablets (20 mg total) by mouth daily.  90 tablet  3  . NORTRIPTYLINE HCL PO Take 2 tablets by mouth at bedtime.        . cephALEXin (KEFLEX) 500 MG capsule Take 1 capsule (500 mg total) by mouth 3 (three) times daily.  20 capsule  0  . divalproex (DEPAKOTE ER) 500 MG 24 hr tablet as directed.      [name] HYDROcodone-acetaminophen (VICODIN) 5-500 MG per tablet as directed.        Allergies  Allergen Reactions  . Ceclor (Cefaclor)   . Lexapro   . Penicillins   . Sulfa Drugs Cross Reactors   . Zoloft     Patient  Active Problem List  Diagnoses  . Benign hypertensive heart disease without heart failure  . Chest pain  . Dyslipidemia  . Depression  . Osteoarthritis  . Bronchitis  . Edema    History  Smoking status  . Current Everyday Smoker -- 0.5 packs/day for 46 years  . Types: Cigarettes  Smokeless tobacco  . Not on file    History  Alcohol Use No    No family history on file.  Review of Systems: Constitutional: no fever chills diaphoresis or fatigue or change in weight.  Head and neck: no hearing loss, no epistaxis, no photophobia or visual disturbance. Respiratory: No cough, shortness of breath or wheezing. Cardiovascular: No chest pain peripheral edema, palpitations. Gastrointestinal: No abdominal distention, no abdominal pain, no change in bowel habits hematochezia or melena. Genitourinary: No dysuria, no frequency, no urgency, no nocturia. Musculoskeletal:No arthralgias, no back pain, no gait disturbance or myalgias. Neurological: No dizziness, no headaches, no numbness, no seizures, no syncope, no weakness, no tremors. Hematologic: No lymphadenopathy, no easy bruising. Psychiatric: No confusion, no hallucinations, no sleep disturbance.    Physical Exam: Filed Vitals:   03/15/12 1011  BP: 128/78  Pulse: 80   the general appearance reveals a well-developed well-nourished Caucasian female in no distress.The head and neck exam reveals pupils equal and reactive.  Extraocular movements are full.  There is no scleral icterus.  The mouth and pharynx are normal.  The neck is supple.  The carotids reveal no bruits.  The jugular venous pressure is normal.  The  thyroid is not enlarged.  There is no lymphadenopathy.  The chest is clear to percussion and auscultation.  There are no rales or rhonchi.  Expansion of the chest is symmetrical.  The precordium is quiet.  The first heart sound is normal.  The second heart sound is physiologically split.  There is no murmur gallop rub or click.   There is no abnormal lift or heave.  The abdomen is soft and nontender.  The bowel sounds are normal.  The liver and spleen are not enlarged.  There are no abdominal masses.  There are no abdominal bruits.  Extremities reveal good pedal pulses.  There is mild residual swelling tenderness and edema of the right lower leg. There is no cyanosis or clubbing.  Strength is normal and symmetrical in all extremities.  There is no lateralizing weakness.  There are no sensory deficits.  The skin is warm and dry.  There is no rash.     Assessment / Plan: She needs to be wearing the length support hose.  She needs to elevate her legs whenever she gets the opportunity.  We will switch her to cephalexin 500 mg 3 times a day #20

## 2012-03-15 NOTE — Patient Instructions (Signed)
Stop Cipro.  Start Keflex 500mg  three times per day.  Purchase a pair of knee high support hose at the drug store and wear them daily while awake.  Keep your appointment with Dr. Patty Sermons.

## 2012-03-16 NOTE — Telephone Encounter (Signed)
Left message

## 2012-03-19 ENCOUNTER — Telehealth: Payer: Self-pay | Admitting: *Deleted

## 2012-03-19 NOTE — Telephone Encounter (Signed)
Left message with husband.  He states he thinks she is doing better. Advised to have her call if any problems

## 2012-03-19 NOTE — Telephone Encounter (Signed)
Called again, spoke with husband and he thinks she is doing better.  Advised to call if any problems

## 2012-03-19 NOTE — Telephone Encounter (Signed)
Message copied by Burnell Blanks on Fri Mar 19, 2012  2:59 PM ------      Message from: Rosalio Macadamia      Created: Fri Mar 12, 2012  3:40 PM       Ok to report. No DVT. Was told at time of testing.

## 2012-03-19 NOTE — Telephone Encounter (Signed)
Message copied by Burnell Blanks on Fri Mar 19, 2012  2:58 PM ------      Message from: Rosalio Macadamia      Created: Tue Mar 09, 2012  9:41 PM       Ok to report. Labs are satisfactory.

## 2012-03-25 ENCOUNTER — Telehealth: Payer: Self-pay | Admitting: Cardiology

## 2012-03-25 MED ORDER — CEPHALEXIN 500 MG PO CAPS: 500.0000 mg | ORAL_CAPSULE | Freq: Three times a day (TID) | ORAL | Status: AC

## 2012-03-25 NOTE — Telephone Encounter (Signed)
New Problem:     Patient called in to report that her condition improved a lot being on cephALEXin (KEFLEX) 500 MG capsule but there seems to still be a lot of swelling and some discoloration.  Please call back.

## 2012-03-25 NOTE — Telephone Encounter (Signed)
Would refill the Keflex one more time.

## 2012-03-25 NOTE — Telephone Encounter (Signed)
Finished Keflex on Monday, leg starting to turn a little red again and still swelling.  States also having some pain.  Will forward to  Dr. Patty Sermons for review

## 2012-03-25 NOTE — Telephone Encounter (Signed)
Advised patient

## 2012-04-28 ENCOUNTER — Encounter: Payer: Self-pay | Admitting: Cardiology

## 2012-04-28 ENCOUNTER — Ambulatory Visit (INDEPENDENT_AMBULATORY_CARE_PROVIDER_SITE_OTHER): Payer: Managed Care, Other (non HMO) | Admitting: Cardiology

## 2012-04-28 VITALS — BP 130/86 | HR 80 | Ht 63.0 in | Wt 140.8 lb

## 2012-04-28 DIAGNOSIS — J40 Bronchitis, not specified as acute or chronic: Secondary | ICD-10-CM

## 2012-04-28 DIAGNOSIS — F3289 Other specified depressive episodes: Secondary | ICD-10-CM

## 2012-04-28 DIAGNOSIS — F32A Depression, unspecified: Secondary | ICD-10-CM

## 2012-04-28 DIAGNOSIS — F329 Major depressive disorder, single episode, unspecified: Secondary | ICD-10-CM

## 2012-04-28 DIAGNOSIS — I119 Hypertensive heart disease without heart failure: Secondary | ICD-10-CM

## 2012-04-28 NOTE — Assessment & Plan Note (Signed)
The patient has a past history of high blood pressure.  She is on Hyzaar and nadolol.  She is not having any dizzy spells or syncope.

## 2012-04-28 NOTE — Progress Notes (Signed)
Catherine Kelley Date of Birth:  August 05, 1951 [facility] [address] [place], [place]  [address] [phone]  Fax   [phone]  HPI: This pleasant 61 year old woman is seen for a scheduled followup office visit.  He has a history of frequent episodes of bronchitis and she continues to smoke against advice.  She has a history of essential hypertension.  She has also had some problems with recent mild peripheral edema.  In April she was treated with bilateral cellulitis with improvement.  Current Outpatient Prescriptions  Medication Sig Dispense Refill  . Albuterol (VENTOLIN IN) Inhale into the lungs. As directed      . ALPRAZolam (XANAX PO) Take 0.5 mg by mouth daily.       [name] aspirin 81 MG tablet Take 81 mg by mouth daily.        . divalproex (DEPAKOTE ER) 500 MG 24 hr tablet Take 1,000 mg by mouth daily.       [name] HYDROcodone-acetaminophen (VICODIN) 5-500 MG per tablet as directed.      [name] losartan-hydrochlorothiazide (HYZAAR) 50-12.5 MG per tablet TAKE 1 TABLET EVERY DAY  30 tablet  11  . nadolol (CORGARD) 40 MG tablet Take 0.5 tablets (20 mg total) by mouth daily.  90 tablet  3  . NORTRIPTYLINE HCL PO Take 2 tablets by mouth at bedtime.        . furosemide (LASIX) 20 MG tablet Take 1 tablet (20 mg total) by mouth daily.  30 tablet  11    Allergies  Allergen Reactions  . Ceclor (Cefaclor)   . Escitalopram Oxalate   . Penicillins   . Sertraline Hcl   . Sulfa Drugs Cross Reactors     Patient Active Problem List  Diagnoses  . Benign hypertensive heart disease without heart failure  . Chest pain  . Dyslipidemia  . Depression  . Osteoarthritis  . Bronchitis  . Edema  . Cellulitis    History  Smoking status  . Current Everyday Smoker -- 0.5 packs/day for 46 years  . Types: Cigarettes  Smokeless tobacco  . Not on file    History  Alcohol Use No    No family history on file.  Review of Systems: The patient denies any heat or cold intolerance.  No  weight gain or weight loss.  The patient denies headaches or blurry vision.  There is no cough or sputum production.  The patient denies dizziness.  There is no hematuria or hematochezia.  The patient denies any muscle aches or arthritis.  The patient denies any rash.  The patient denies frequent falling or instability.  There is no history of depression or anxiety.  All other systems were reviewed and are negative.   Physical Exam: Filed Vitals:   04/28/12 1635  BP: 130/86  Pulse: 80   General appearance reveals a well-developed well-nourished woman in no distress.The head and neck exam reveals pupils equal and reactive.  Extraocular movements are full.  There is no scleral icterus.  The mouth and pharynx are normal.  The neck is supple.  The carotids reveal no bruits.  The jugular venous pressure is normal.  The  thyroid is not enlarged.  There is no lymphadenopathy.  The chest is clear to percussion and auscultation.  There are bilateral coarse rhonchi at the bases  Expansion of the chest is symmetrical.  The precordium is quiet.  The first heart sound is normal.  The second heart sound is physiologically split.  There is  no murmur gallop rub or click.  There is no abnormal lift or heave.  The abdomen is soft and nontender.  The bowel sounds are normal.  The liver and spleen are not enlarged.  There are no abdominal masses.  There are no abdominal bruits.  Extremities reveal good pedal pulses.  There is no phlebitis and there is mild bilateral pretibial edema  There is no cyanosis or clubbing.  Strength is normal and symmetrical in all extremities.  There is no lateralizing weakness.  There are no sensory deficits.  The skin is warm and dry.  There is no rash.     Assessment / Plan: The patient is to continue same medication.  She will resume taking furosemide 20 mg daily.  Recheck in 6 months for followup office visit and basal metabolic panel.

## 2012-04-28 NOTE — Assessment & Plan Note (Signed)
The patient has a long history of depression and is followed by psychiatry.  The present time she's appears to be doing well regarding this

## 2012-04-28 NOTE — Assessment & Plan Note (Signed)
The patient still has a mild cough but her purulent sputum has resolved with previous antibiotics.  She's not having hemoptysis or pleuritic chest pain.

## 2012-04-28 NOTE — Patient Instructions (Signed)
Your physician recommends that you continue on your current medications as directed. Please refer to the Current Medication list given to you today.  Your physician wants you to follow-up in: 6 month. You will receive a reminder letter in the mail two months in advance. If you don't receive a letter, please call our office to schedule the follow-up appointment.  

## 2012-05-02 ENCOUNTER — Other Ambulatory Visit: Payer: Self-pay | Admitting: Cardiology

## 2012-07-02 ENCOUNTER — Other Ambulatory Visit: Payer: Self-pay | Admitting: Cardiology

## 2012-07-02 ENCOUNTER — Other Ambulatory Visit: Payer: Self-pay | Admitting: *Deleted

## 2012-08-09 ENCOUNTER — Ambulatory Visit
Admission: RE | Admit: 2012-08-09 | Discharge: 2012-08-09 | Disposition: A | Discharge status: Home / Self Care | Payer: Managed Care, Other (non HMO) | Source: Ambulatory Visit | Attending: Adult Health | Admitting: Adult Health

## 2012-08-09 ENCOUNTER — Other Ambulatory Visit: Payer: Self-pay | Admitting: Adult Health

## 2012-08-09 DIAGNOSIS — R0981 Nasal congestion: Secondary | ICD-10-CM

## 2012-08-25 ENCOUNTER — Telehealth: Payer: Self-pay | Admitting: Cardiology

## 2012-08-25 NOTE — Telephone Encounter (Signed)
Please return call to patient at hm# or cell # [phone] regarding lab orders

## 2012-08-25 NOTE — Telephone Encounter (Signed)
Patient thinks she has cellulitis again scheduled appointment with  Dr. Patty Sermons for end of week

## 2012-08-27 ENCOUNTER — Ambulatory Visit (INDEPENDENT_AMBULATORY_CARE_PROVIDER_SITE_OTHER): Payer: Managed Care, Other (non HMO) | Admitting: Cardiology

## 2012-08-27 ENCOUNTER — Encounter: Payer: Self-pay | Admitting: Cardiology

## 2012-08-27 ENCOUNTER — Telehealth: Payer: Self-pay | Admitting: *Deleted

## 2012-08-27 VITALS — BP 128/88 | HR 76 | Ht 63.0 in | Wt 150.0 lb

## 2012-08-27 DIAGNOSIS — R42 Dizziness and giddiness: Secondary | ICD-10-CM

## 2012-08-27 DIAGNOSIS — I119 Hypertensive heart disease without heart failure: Secondary | ICD-10-CM

## 2012-08-27 DIAGNOSIS — J019 Acute sinusitis, unspecified: Secondary | ICD-10-CM

## 2012-08-27 DIAGNOSIS — J40 Bronchitis, not specified as acute or chronic: Secondary | ICD-10-CM

## 2012-08-27 LAB — BASIC METABOLIC PANEL
CO2: 29 mEq/L (ref 19–32)
Chloride: 96 mEq/L (ref 96–112)
Glucose, Bld: 141 mg/dL — ABNORMAL HIGH (ref 70–99)
Sodium: 134 mEq/L — ABNORMAL LOW (ref 135–145)

## 2012-08-27 LAB — CBC WITH DIFFERENTIAL/PLATELET
Basophils Relative: 0.4 % (ref 0.0–3.0)
Eosinophils Absolute: 0.3 10*3/uL (ref 0.0–0.7)
HCT: 40.7 % (ref 36.0–46.0)
Hemoglobin: 13.6 g/dL (ref 12.0–15.0)
Lymphs Abs: 2.3 10*3/uL (ref 0.7–4.0)
MCHC: 33.4 g/dL (ref 30.0–36.0)
MCV: 97.2 fl (ref 78.0–100.0)
Monocytes Absolute: 0.6 10*3/uL (ref 0.1–1.0)
Neutro Abs: 4.2 10*3/uL (ref 1.4–7.7)
RBC: 4.19 Mil/uL (ref 3.87–5.11)
RDW: 13.5 % (ref 11.5–14.6)

## 2012-08-27 LAB — HEPATIC FUNCTION PANEL
Albumin: 3.5 g/dL (ref 3.5–5.2)
Alkaline Phosphatase: 104 U/L (ref 39–117)
Total Protein: 7.3 g/dL (ref 6.0–8.3)

## 2012-08-27 NOTE — Telephone Encounter (Signed)
Advised patient of lab results  

## 2012-08-27 NOTE — Patient Instructions (Signed)
Will obtain labs today and call you with the results   Cut back on your salt intake  Your physician recommends that you continue on your current medications as directed. Please refer to the Current Medication list given to you today.  Your physician wants you to follow-up in: 4 months with fasting labs (lp/cbc)  You will receive a reminder letter in the mail two months in advance. If you don't receive a letter, please call our office to schedule the follow-up appointment.

## 2012-08-27 NOTE — Assessment & Plan Note (Signed)
Patient has not had any headaches.  She is not having any symptoms of increased exertional dyspnea or orthopnea or paroxysmal nocturnal dyspnea.  Her peripheral edema is mild and appears to be dependent

## 2012-08-27 NOTE — Progress Notes (Signed)
Catherine Kelley Date of Birth:  [birth year] [facility] [address] [place], [place]  [address] [phone]  Fax   [phone]  HPI: This pleasant 61 year old woman is seen for a scheduled followup office visit. He has a history of frequent episodes of bronchitis and she continues to smoke against advice. She has a history of essential hypertension. She has also had some problems with recent mild peripheral edema. In April she was treated with bilateral cellulitis with improvement.  Since last visit she's been experiencing some increased peripheral edema.  She does admit to etiology salt.  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  She has a history of depression.  She had quit smoking briefly but is now smoking again.  Current Outpatient Prescriptions  Medication Sig Dispense Refill  . Albuterol (VENTOLIN IN) Inhale into the lungs. As directed      . ALPRAZolam (XANAX PO) Take 0.5 mg by mouth daily.       [name] aspirin 81 MG tablet Take 81 mg by mouth daily.        . divalproex (DEPAKOTE ER) 500 MG 24 hr tablet Take 1,000 mg by mouth daily.       . furosemide (LASIX) 20 MG tablet Take 1 tablet (20 mg total) by mouth daily.  30 tablet  11  . HYDROcodone-acetaminophen (VICODIN) 5-500 MG per tablet as directed.      [name] losartan-hydrochlorothiazide (HYZAAR) 50-12.5 MG per tablet TAKE 1 TABLET EVERY DAY  30 tablet  11  . nadolol (CORGARD) 40 MG tablet Take 0.5 tablets (20 mg total) by mouth daily.  90 tablet  3  . NORTRIPTYLINE HCL PO Take 2 tablets by mouth at bedtime.          Allergies  Allergen Reactions  . Ceclor (Cefaclor)   . Escitalopram Oxalate   . Penicillins   . Sertraline Hcl   . Sulfa Drugs Cross Reactors     Patient Active Problem List  Diagnosis  . Benign hypertensive heart disease without heart failure  . Chest pain  . Dyslipidemia  . Depression  . Osteoarthritis  . Bronchitis  . Edema  . Cellulitis    History  Smoking  status  . Current Every Day Smoker -- 0.5 packs/day for 46 years  . Types: Cigarettes  Smokeless tobacco  . Not on file    History  Alcohol Use No    No family history on file.  Review of Systems: The patient denies any heat or cold intolerance.  No weight gain or weight loss.  The patient denies headaches or blurry vision.  There is no cough or sputum production.  The patient denies dizziness.  There is no hematuria or hematochezia.  The patient denies any muscle aches or arthritis.  The patient denies any rash.  The patient denies frequent falling or instability.  There is no history of depression or anxiety.  All other systems were reviewed and are negative.   Physical Exam: Filed Vitals:   08/27/12 0939  BP: 128/88  Pulse: 76   general appearance reveals a well-developed well-nourished woman in no distress.The head and neck exam reveals pupils equal and reactive.  Extraocular movements are full.  There is no scleral icterus.  The mouth and pharynx are normal.  The neck is supple.  The carotids reveal no bruits.  The jugular venous pressure is normal.  The  thyroid is not enlarged.  There is no  lymphadenopathy.  The chest is clear to percussion and auscultation.  There are no rales or rhonchi.  Expansion of the chest is symmetrical.  The precordium is quiet.  The first heart sound is normal.  The second heart sound is physiologically split.  There is no murmur gallop rub or click.  There is no abnormal lift or heave.  The abdomen is soft and nontender.  The bowel sounds are normal.  The liver and spleen are not enlarged.  There are no abdominal masses.  There are no abdominal bruits.  Extremities reveal good pedal pulses.  There is no phlebitis but there is mild peripheral edema.  There is no cyanosis or clubbing.  Strength is normal and symmetrical in all extremities.  There is no lateralizing weakness.  There are no sensory deficits.  The skin is warm and dry.  There is no  rash.     Assessment / Plan: Continue current medication.  Be more careful with dietary salt.  Quit smoking.  Recheck in 4 months for office visit and CBC and basal metabolic panel.  Today we are getting blood work results pending

## 2012-08-27 NOTE — Assessment & Plan Note (Signed)
Her symptoms of bronchitis had improved since last visit but she still has some expiratory wheezing and is still smoking.  We urged her to quit smoking again

## 2012-08-27 NOTE — Progress Notes (Signed)
Quick Note:  Please report to patient. The recent labs are stable. Continue same medication and careful diet.Liver all okay. ______

## 2012-08-27 NOTE — Telephone Encounter (Signed)
Message copied by Burnell Blanks on Fri Aug 27, 2012  5:59 PM ------      Message from: Cassell Clement      Created: Fri Aug 27, 2012  2:46 PM       Please report to patient.  The recent labs are stable. Continue same medication and careful diet.Liver all okay.

## 2012-09-01 ENCOUNTER — Telehealth: Payer: Self-pay | Admitting: Cardiology

## 2012-09-01 NOTE — Telephone Encounter (Signed)
Left message to call back  

## 2012-09-01 NOTE — Telephone Encounter (Signed)
For a PCP would recommend Dr. Oliver Barre or someone in his practice.  Would defer recommendation for a GYN to her PCP.

## 2012-09-01 NOTE — Telephone Encounter (Signed)
New problem:  Need a referral to PCP & OB-GYN .

## 2012-09-01 NOTE — Telephone Encounter (Signed)
Spoke with pt and told her I would forward to Dr. Patty Sermons for his recommendations on PCP and OB-GYN.  Pt states she fell on Saturday. Does not think she has any injury from fall. No complaints of dizziness.   States falling is not new and has been going on for last several months. Saw Dr.Brackbill last week.  Pt states sinus infection is improving.  States legs are still red and swollen.  Thinks redness is getting worse.  Is taking Lasix as instructed at last office visit and trying to limit salt. Does not have scales and is unable to weigh daily.  I instructed pt to keep legs elevated when in chair.  Will forward to Dr. Patty Sermons for any additional recommendations.

## 2012-09-02 NOTE — Telephone Encounter (Signed)
Pt advised to call Dr. Jonny Ruiz or go to urgent care for leg issues per Dr. Patty Sermons.

## 2012-09-02 NOTE — Telephone Encounter (Signed)
Follow-up:    Patient returned your call.  Please call back. 

## 2012-09-21 DIAGNOSIS — L02619 Cutaneous abscess of unspecified foot: Secondary | ICD-10-CM | POA: Insufficient documentation

## 2012-09-21 DIAGNOSIS — L03119 Cellulitis of unspecified part of limb: Secondary | ICD-10-CM | POA: Insufficient documentation

## 2012-09-27 DIAGNOSIS — L02619 Cutaneous abscess of unspecified foot: Secondary | ICD-10-CM

## 2012-09-29 ENCOUNTER — Encounter: Payer: Self-pay | Admitting: Internal Medicine

## 2012-09-29 ENCOUNTER — Ambulatory Visit (INDEPENDENT_AMBULATORY_CARE_PROVIDER_SITE_OTHER): Payer: Managed Care, Other (non HMO) | Admitting: Internal Medicine

## 2012-09-29 VITALS — BP 139/80 | HR 73 | Temp 98.0°F | Ht 62.0 in | Wt 151.0 lb

## 2012-09-29 DIAGNOSIS — I872 Venous insufficiency (chronic) (peripheral): Secondary | ICD-10-CM

## 2012-09-29 DIAGNOSIS — Z23 Encounter for immunization: Secondary | ICD-10-CM

## 2012-09-29 NOTE — Progress Notes (Signed)
Patient ID: Catherine Kelley, female   DOB: 1951-03-13, 61 y.o.   MRN: [id number]    [facility] for Infectious Disease  Reason for Consult: Evaluation for possible recurrent lower extremity cellulitis Referring Physician: Luster Landsberg, NP  Patient Active Problem List  Diagnosis  . Benign hypertensive heart disease without heart failure  . Chest pain  . Dyslipidemia  . Depression  . Osteoarthritis  . Bronchitis  . Edema  . Cellulitis and abscess of foot, except toes  . Venous insufficiency (chronic) (peripheral)  . Chronic venous insufficiency    Patient's Medications  New Prescriptions   No medications on file  Previous Medications   ALBUTEROL (VENTOLIN IN)    Inhale into the lungs. As directed   ALPRAZOLAM (XANAX PO)    Take 0.5 mg by mouth daily.    ASPIRIN 81 MG TABLET    Take 81 mg by mouth daily.     DIVALPROEX (DEPAKOTE ER) 500 MG 24 HR TABLET    Take 500 mg by mouth daily.    FUROSEMIDE (LASIX) 20 MG TABLET    Take 1 tablet (20 mg total) by mouth daily.   HYDROCODONE-ACETAMINOPHEN (VICODIN) 5-500 MG PER TABLET    as directed.   LOSARTAN-HYDROCHLOROTHIAZIDE (HYZAAR) 50-12.5 MG PER TABLET    TAKE 1 TABLET EVERY DAY   NADOLOL (CORGARD) 40 MG TABLET    Take 0.5 tablets (20 mg total) by mouth daily.   NORTRIPTYLINE HCL PO    Take 2 tablets by mouth at bedtime.    Modified Medications   No medications on file  Discontinued Medications   CIPROFLOXACIN (CIPRO) 250 MG TABLET    Take 250 mg by mouth 2 (two) times daily.   LEVOFLOXACIN (LEVAQUIN) 500 MG TABLET    Take 500 mg by mouth every 12 (twelve) hours.    Recommendations: 1. Observe off of antibiotics 2. Wear knee-high compressive stockings during the day  3. Monitor her weight and establish home based intervention if her weight goes up unexpectedly 4. I have given her information on the venous insufficiency from Up-to-Date 5. Followup here as needed  Assessment: I believe the major underlying problem here is  venous insufficiency leading to intermittent acute on chronic lower extremity edema resulting in tightness and skin discoloration. It is very unusual to have simultaneous bilateral lower extremity cellulitis and the fact that she does not have pain, warmth or fever on exam would go against infectious cellulitis. Chronic venous insufficiency can certainly predispose to superimposed cellulitis or she may have had some episodes previously but her exam today does not suggest infection. I have advised her against taking another round of antibiotics today. I suggested that she return to using knee-high stockings on a daily basis. She should try to put them on her early in the morning before her legs start to swell and may take them off at the end of the day. I would also suggest that she obtain scales to monitor her weight at home. I note that her current weight is 151 which is up 15 pounds over the past 6 weeks.  HPI: Catherine Kelley is a 61 y.o. female with a history of hypertension, diastolic dysfunction and COPD who is referred to me by Luster Landsberg for evaluation of possible recurrent lower extremity cellulitis. Catherine Kelley can recall having several episodes where her feet would swell and turn red. The first occurred about 3 years ago. She recalls it being in both feet and ankles. They were swollen, somewhat  tight and red but she did not have any pain or fever. She believes that episode resolved on its own. She also thinks that that occurred when she had sudden fluid retention and weight gain.  She was seen for a similar episode in early April of this year her by Francene Finders. She was treated with low dose furosemide and ciprofloxacin. She was seen back on April 15 by Dr. Patty Sermons and was switched to cephalexin with slow improvement.  The third and latest episode began a little over a month ago. It was similar to the first 2 episodes in that both legs were involved with swelling, tightness and redness but no pain,  warmth or fever. She has been treated with one course of ciprofloxacin and more recently a course of levofloxacin without much benefit.  She works at [employer] and is on her feet all day. She notes that even on good days her feet will be swollen by the end of the day. She used to wear stockings and noted that that helped with the swelling but she fell out of the habit over the past year. She continues to smoke cigarettes.  Review of Systems: Pertinent items are noted in HPI.      Past Medical History  Diagnosis Date  . Hypertension     essential hypertension  . Tobacco abuse     smoking about 1/2 pk of cigarettes a day  . Anxiety   . Depression     psychiatrist Dr. Evelene Croon  . Respiratory infection   . Shortness of breath   . Malaise   . Lightheadedness   . Emphysematous COPD     changes in right base along with patchy areas on last x-ray  . COPD (chronic obstructive pulmonary disease)   . Hypertriglyceridemia   . History of echocardiogram 12/24/2006    Est. EF of 55-60% NORMAL LV SYSTOLIC FUNCTION WITH IMPAIRED RELAXATION -- MILD AORTIC SCLEROSIS -- NORMAL PALONARY ARTERY PRESSURE -- NO OLD ECHOS FOR COMPARISON -- Catherine Kelley  . History of cardiovascular stress test 08/15/2004    EF of 70% -- Normal stress cardiolite.  There is no evidence of ischemia and there is normal LV function. -- Catherine Fus A. Brackbill. Kelley  . Diastolic dysfunction     History  Substance Use Topics  . Smoking status: Current Every Day Smoker -- 0.5 packs/day for 46 years    Types: Cigarettes  . Smokeless tobacco: Not on file  . Alcohol Use: No    No family history on file. Allergies  Allergen Reactions  . Augmentin (Amoxicillin-Pot Clavulanate)   . Ceclor (Cefaclor)   . Escitalopram Oxalate   . Penicillins   . Sertraline Hcl   . Sulfa Drugs Cross Reactors     OBJECTIVE: Blood pressure 139/80, pulse 73, temperature 98 F (36.7 C), temperature source Oral, height 5\' 2"  (1.575  m), weight 151 lb (68.493 kg). General: She is alert and in no distress Lungs: Clear Cor: Regular S1 and S2 with no murmurs Abdomen: Mildly obese, soft and nontender Extremities: She has 1+ pitting edema of her lower extremities slightly worse on the left than on the right. She has dusky redness of her feet that appears chronic. The skin of her lower legs and ankles is tight and shiny. Her feet are cold but she has good, strong dorsalis pedis pulses. She has no pain on examination of her feet.  Microbiology: No results found for this or any previous visit (from the past  240 hour(s)).  Cliffton Asters, [facility] for Infectious Disease [facility] [phone] pager   [phone] cell 09/29/2012, 3:29 PM

## 2012-10-11 ENCOUNTER — Other Ambulatory Visit: Payer: Self-pay | Admitting: Internal Medicine

## 2012-10-11 DIAGNOSIS — Z1231 Encounter for screening mammogram for malignant neoplasm of breast: Secondary | ICD-10-CM

## 2012-10-15 ENCOUNTER — Other Ambulatory Visit: Payer: Self-pay | Admitting: Internal Medicine

## 2012-10-15 ENCOUNTER — Ambulatory Visit
Admission: RE | Admit: 2012-10-15 | Discharge: 2012-10-15 | Disposition: A | Discharge status: Home / Self Care | Payer: Managed Care, Other (non HMO) | Source: Ambulatory Visit | Attending: Internal Medicine | Admitting: Internal Medicine

## 2012-10-15 DIAGNOSIS — Z1231 Encounter for screening mammogram for malignant neoplasm of breast: Secondary | ICD-10-CM

## 2012-10-15 DIAGNOSIS — R52 Pain, unspecified: Secondary | ICD-10-CM

## 2012-10-27 ENCOUNTER — Encounter (HOSPITAL_COMMUNITY): Payer: Self-pay

## 2012-10-27 ENCOUNTER — Emergency Department (HOSPITAL_COMMUNITY)
Admission: EM | Admit: 2012-10-27 | Discharge: 2012-10-27 | Disposition: A | Discharge status: Home / Self Care | Payer: Managed Care, Other (non HMO) | Source: Home / Self Care | Attending: Emergency Medicine | Admitting: Emergency Medicine

## 2012-10-27 DIAGNOSIS — J40 Bronchitis, not specified as acute or chronic: Secondary | ICD-10-CM

## 2012-10-27 MED ORDER — GUAIFENESIN-CODEINE 100-10 MG/5ML PO SYRP: 5.0000 mL | ORAL_SOLUTION | Freq: Three times a day (TID) | ORAL | Status: DC | PRN

## 2012-10-27 MED ORDER — DOXYCYCLINE HYCLATE 100 MG PO CAPS: 100.0000 mg | ORAL_CAPSULE | Freq: Two times a day (BID) | ORAL | Status: DC

## 2012-10-27 NOTE — ED Notes (Signed)
C/o sinus issues, then developed a cough, which she says has settled in her chest; could not get in to see her MD today

## 2012-10-27 NOTE — ED Provider Notes (Signed)
History     CSN: [id number]  Arrival date & time 10/27/12  1333   First MD Initiated Contact with Patient 10/27/12 1357      Chief Complaint  Patient presents with  . Cough    (Consider location/radiation/quality/duration/timing/severity/associated sxs/prior treatment) HPI Comments: Patient presents urgent care describing that she's been having sinus congestion and a cough with productive sputum for almost 2 weeks now. She feels cold is moving to her chest within the last 2-3 days and is coughing even more. Feeling somewhat tired and fatigued with increased sinus congestion and pressure. His described that when she blows her nose she sees greenish yellowish looking mucosal material. Also having some discomfort when she swallows  Patient is a 61 y.o. female presenting with cough. The history is provided by the patient.  Cough This is a new problem. The current episode started more than 1 week ago. The problem occurs constantly. The problem has not changed since onset.The cough is productive of sputum. Associated symptoms include ear congestion, headaches, rhinorrhea and sore throat. Pertinent negatives include no chest pain, no chills, no shortness of breath and no wheezing. The treatment provided no relief. She is not a smoker. Her past medical history is significant for bronchitis and pneumonia. Her past medical history does not include emphysema or asthma.    Past Medical History  Diagnosis Date  . Hypertension     essential hypertension  . Tobacco abuse     smoking about 1/2 pk of cigarettes a day  . Anxiety   . Depression     psychiatrist Dr. Evelene Croon  . Respiratory infection   . Shortness of breath   . Malaise   . Lightheadedness   . Emphysematous COPD     changes in right base along with patchy areas on last x-ray  . COPD (chronic obstructive pulmonary disease)   . Hypertriglyceridemia   . History of echocardiogram 12/24/2006    Est. EF of 55-60% NORMAL LV SYSTOLIC FUNCTION  WITH IMPAIRED RELAXATION -- MILD AORTIC SCLEROSIS -- NORMAL PALONARY ARTERY PRESSURE -- NO OLD ECHOS FOR COMPARISON -- Cassell Clement, MD  . History of cardiovascular stress test 08/15/2004    EF of 70% -- Normal stress cardiolite.  There is no evidence of ischemia and there is normal LV function. -- Maisie Fus A. Brackbill. MD  . Diastolic dysfunction     History reviewed. No pertinent past surgical history.  History reviewed. No pertinent family history.  History  Substance Use Topics  . Smoking status: Current Every Day Smoker -- 0.5 packs/day for 46 years    Types: Cigarettes  . Smokeless tobacco: Not on file  . Alcohol Use: No    OB History    Grav Para Term Preterm Abortions TAB SAB Ect Mult Living                  Review of Systems  Constitutional: Positive for activity change. Negative for chills, diaphoresis, appetite change and fatigue.  HENT: Positive for congestion, sore throat, rhinorrhea, postnasal drip and sinus pressure. Negative for neck pain and neck stiffness.   Respiratory: Positive for cough. Negative for chest tightness, shortness of breath and wheezing.   Cardiovascular: Negative for chest pain and palpitations.  Skin: Negative for rash.  Neurological: Positive for headaches.    Allergies  Augmentin; Ceclor; Escitalopram oxalate; Penicillins; Sertraline hcl; and Sulfa drugs cross reactors  Home Medications   Current Outpatient Rx  Name  Route  Sig  Dispense  Refill  .  VENTOLIN IN   Inhalation   Inhale into the lungs. As directed         . XANAX PO   Oral   Take 0.5 mg by mouth daily.          . ASPIRIN 81 MG PO TABS   Oral   Take 81 mg by mouth daily.           [name] DIVALPROEX SODIUM ER 500 MG PO TB24   Oral   Take 500 mg by mouth daily.          [name] DOXYCYCLINE HYCLATE 100 MG PO CAPS   Oral   Take 1 capsule (100 mg total) by mouth 2 (two) times daily.   20 capsule   0   . FUROSEMIDE 20 MG PO TABS   Oral   Take 1 tablet (20 mg  total) by mouth daily.   30 tablet   11   . GUAIFENESIN-CODEINE 100-10 MG/5ML PO SYRP   Oral   Take 5 mLs by mouth 3 (three) times daily as needed for cough or congestion.   120 mL   0   . HYDROCODONE-ACETAMINOPHEN 5-500 MG PO TABS      as directed.         [name] LOSARTAN POTASSIUM-HCTZ 50-12.5 MG PO TABS      TAKE 1 TABLET EVERY DAY   30 tablet   11   . NADOLOL 40 MG PO TABS   Oral   Take 0.5 tablets (20 mg total) by mouth daily.   90 tablet   3   . NORTRIPTYLINE HCL PO   Oral   Take 2 tablets by mouth at bedtime.             BP 127/74  Pulse 82  Temp 97.4 F (36.3 C) (Oral)  Resp 16  SpO2 96%  Physical Exam  Nursing note and vitals reviewed. Constitutional: Vital signs are normal. She appears well-developed and well-nourished.  Non-toxic appearance. She does not have a sickly appearance. She does not appear ill. No distress.  HENT:  Head: Normocephalic.  Right Ear: Tympanic membrane normal.  Left Ear: Tympanic membrane normal.  Mouth/Throat: Uvula is midline. Posterior oropharyngeal erythema present. No oropharyngeal exudate, posterior oropharyngeal edema or tonsillar abscesses.  Eyes: Conjunctivae normal are normal. Pupils are equal, round, and reactive to light.  Neck: Neck supple. No JVD present.  Cardiovascular: Normal rate.  Exam reveals no gallop and no friction rub.   No murmur heard. Pulmonary/Chest: Effort normal and breath sounds normal. She has no decreased breath sounds.  Abdominal: Soft. Bowel sounds are normal.  Musculoskeletal: Normal range of motion.  Lymphadenopathy:    She has no cervical adenopathy.  Neurological: She is alert.  Skin: No rash noted. No erythema.    ED Course  Procedures (including critical care time)  Labs Reviewed - No data to display No results found.   1. Bronchitis       MDM  Patient looks comfortable afebrile in no respiratory distress. Repeated pulse ox at room air 97-98%. Will prescribe patient and  to self and congestion along with a doxycycline course. Instructed patient to return if worsening symptoms especially shortness of breath patient agrees with treatment plan agrees to follow-up as necessary      Jimmie Molly, MD 10/27/12 1441

## 2013-01-19 ENCOUNTER — Other Ambulatory Visit: Payer: Self-pay | Admitting: *Deleted

## 2013-01-19 MED ORDER — NADOLOL 40 MG PO TABS: 20.0000 mg | ORAL_TABLET | Freq: Every day | ORAL | Status: DC

## 2013-03-10 ENCOUNTER — Other Ambulatory Visit: Payer: Self-pay | Admitting: Nurse Practitioner

## 2013-04-13 ENCOUNTER — Ambulatory Visit: Payer: Managed Care, Other (non HMO) | Admitting: Cardiology

## 2013-05-09 ENCOUNTER — Other Ambulatory Visit: Payer: Self-pay | Admitting: *Deleted

## 2013-05-09 MED ORDER — LOSARTAN POTASSIUM-HCTZ 50-12.5 MG PO TABS: ORAL_TABLET | ORAL | Status: DC

## 2013-05-17 ENCOUNTER — Ambulatory Visit: Payer: Managed Care, Other (non HMO) | Admitting: Cardiology

## 2013-05-30 ENCOUNTER — Telehealth: Payer: Self-pay | Admitting: Cardiology

## 2013-05-30 NOTE — Telephone Encounter (Signed)
Spoke with patient and she states she has tried to call her PCP and they are not answering the phone. Patient stated her toe is starting to turn black. Advised patient she needed to go to Urgent Care for evaluation, verbalized understanding.

## 2013-05-30 NOTE — Telephone Encounter (Signed)
New Problem  Pt states she hurt toe on her left foot about a week ago and it became swollen. She said now her foot and leg are swollen. She wants to know what she should do. She did not have toe checked out when she injured it. She said if try all her numbers if you can not reach her.

## 2013-07-06 ENCOUNTER — Ambulatory Visit: Payer: Managed Care, Other (non HMO) | Admitting: Cardiology

## 2013-07-12 ENCOUNTER — Other Ambulatory Visit: Payer: Self-pay | Admitting: Cardiology

## 2013-07-25 ENCOUNTER — Other Ambulatory Visit: Payer: Self-pay | Admitting: Cardiology

## 2013-08-10 ENCOUNTER — Encounter: Payer: Self-pay | Admitting: Cardiology

## 2013-08-10 ENCOUNTER — Ambulatory Visit (INDEPENDENT_AMBULATORY_CARE_PROVIDER_SITE_OTHER): Payer: Managed Care, Other (non HMO) | Admitting: Cardiology

## 2013-08-10 ENCOUNTER — Encounter (INDEPENDENT_AMBULATORY_CARE_PROVIDER_SITE_OTHER): Payer: Managed Care, Other (non HMO)

## 2013-08-10 VITALS — BP 120/78 | HR 86 | Ht 63.0 in | Wt 149.0 lb

## 2013-08-10 DIAGNOSIS — F3289 Other specified depressive episodes: Secondary | ICD-10-CM

## 2013-08-10 DIAGNOSIS — F329 Major depressive disorder, single episode, unspecified: Secondary | ICD-10-CM

## 2013-08-10 DIAGNOSIS — R609 Edema, unspecified: Secondary | ICD-10-CM

## 2013-08-10 DIAGNOSIS — M79605 Pain in left leg: Secondary | ICD-10-CM

## 2013-08-10 DIAGNOSIS — I872 Venous insufficiency (chronic) (peripheral): Secondary | ICD-10-CM

## 2013-08-10 DIAGNOSIS — M7989 Other specified soft tissue disorders: Secondary | ICD-10-CM

## 2013-08-10 DIAGNOSIS — I119 Hypertensive heart disease without heart failure: Secondary | ICD-10-CM

## 2013-08-10 DIAGNOSIS — M79609 Pain in unspecified limb: Secondary | ICD-10-CM

## 2013-08-10 DIAGNOSIS — F32A Depression, unspecified: Secondary | ICD-10-CM

## 2013-08-10 NOTE — Assessment & Plan Note (Signed)
Patient has a history of depression and is on nortriptyline and Xanax.  On examination today she is somewhat manic rather than depressed and is very talkative.  She brought in extensive pictures of her children and grandchildren today

## 2013-08-10 NOTE — Assessment & Plan Note (Signed)
The patient has not been having these symptoms of congestive heart failure.  She not been aware of any palpitations.  No chest pain.

## 2013-08-10 NOTE — Assessment & Plan Note (Signed)
The patient has been experiencing intermittent aching in her left calf.  No history of trauma.  Her Homan sign is negative but she is tender.  We did a venous Doppler which did not show any evidence of DVT

## 2013-08-10 NOTE — Progress Notes (Signed)
Catherine Kelley Date of Birth:  07/19/1951 [facility] [address] [place], [place]  [address] [phone]         Fax   [phone]  History of Present Illness: This pleasant 62 year old woman is seen for a scheduled followup office visit.  She has a history of essential hypertension.  She also has a history of frequent episodes of bronchitis and continues to smoke against advice.  She has had a history of depression.  Recently she has been concerned about intermittent left calf pain.  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  Her left calf has been hurting for about 4-6 weeks.   Current Outpatient Prescriptions  Medication Sig Dispense Refill  . Albuterol (VENTOLIN IN) Inhale into the lungs. As directed      . ALPRAZolam (XANAX PO) Take 0.5 mg by mouth daily.       [name] aspirin 81 MG tablet Take 81 mg by mouth daily.        . divalproex (DEPAKOTE ER) 500 MG 24 hr tablet Take 500 mg by mouth daily.       . furosemide (LASIX) 20 MG tablet TAKE 1 TABLET BY MOUTH EVERY DAY  30 tablet  4  . HYDROcodone-acetaminophen (VICODIN) 5-500 MG per tablet as directed.      [name] losartan-hydrochlorothiazide (HYZAAR) 50-12.5 MG per tablet TAKE 1 TABLET EVERY DAY  30 tablet  0  . nadolol (CORGARD) 40 MG tablet TAKE 0.5 TABLETS (20 MG TOTAL) BY MOUTH DAILY.  45 tablet  1  . NORTRIPTYLINE HCL PO Take 2 tablets by mouth at bedtime.         No current facility-administered medications for this visit.    Allergies  Allergen Reactions  . Augmentin [Amoxicillin-Pot Clavulanate]   . Ceclor [Cefaclor]   . Escitalopram Oxalate   . Penicillins   . Sertraline Hcl   . Sulfa Drugs Cross Reactors     Patient Active Problem List   Diagnosis Date Noted  . Chest pain 05/09/2011    Priority: High  . Bronchitis 05/09/2011    Priority: High  . Venous insufficiency (chronic) (peripheral) 09/29/2012  . Cellulitis and abscess of foot, except toes 09/21/2012  .  Edema 03/09/2012  . Benign hypertensive heart disease without heart failure 05/09/2011  . Dyslipidemia 05/09/2011  . Depression 05/09/2011  . Osteoarthritis 05/09/2011    History  Smoking status  . Current Every Day Smoker -- 0.50 packs/day for 46 years  . Types: Cigarettes  Smokeless tobacco  . Not on file    History  Alcohol Use No    No family history on file.  Review of Systems: Constitutional: no fever chills diaphoresis or fatigue or change in weight.  Head and neck: no hearing loss, no epistaxis, no photophobia or visual disturbance. Respiratory: No cough, shortness of breath or wheezing. Cardiovascular: No chest pain peripheral edema, palpitations. Gastrointestinal: No abdominal distention, no abdominal pain, no change in bowel habits hematochezia or melena. Genitourinary: No dysuria, no frequency, no urgency, no nocturia. Musculoskeletal:No arthralgias, no back pain, no gait disturbance or myalgias. Neurological: No dizziness, no headaches, no numbness, no seizures, no syncope, no weakness, no tremors. Hematologic: No lymphadenopathy, no easy bruising. Psychiatric: No confusion, no hallucinations, no sleep disturbance.    Physical Exam: Filed Vitals:   08/10/13 1425  BP: 120/78  Pulse: 86   the general appearance reveals a well-developed well-nourished woman in no distress.The head  and neck exam reveals pupils equal and reactive.  Extraocular movements are full.  There is no scleral icterus.  The mouth and pharynx are normal.  The neck is supple.  The carotids reveal no bruits.  The jugular venous pressure is normal.  The  thyroid is not enlarged.  There is no lymphadenopathy.  The chest is clear to percussion and auscultation.  There are no rales or rhonchi.  Expansion of the chest is symmetrical.  The precordium is quiet.  The first heart sound is normal.  The second heart sound is physiologically split.  There is no murmur gallop rub or click.  There is no abnormal  lift or heave.  The abdomen is soft and nontender.  The bowel sounds are normal.  The liver and spleen are not enlarged.  There are no abdominal masses.  There are no abdominal bruits.  Extremities reveal good pedal pulses.  There is no  edema.  There is tenderness and slight swelling of the left calf Homans sign is negative  There is no cyanosis or clubbing.  Strength is normal and symmetrical in all extremities.  There is no lateralizing weakness.  There are no sensory deficits.  The skin is warm and dry.  There is no rash.  EKG today shows normal sinus rhythm with first-degree AV block and nonspecific ST abnormality and is unchanged from 05/09/11.   Assessment / Plan: Venous duplex done today was negative for DVT.  She was advised to stop smoking.  We will also have her increase her baby aspirin up to 2 tablets daily. Continue other medicines as directed and be rechecked in 4 months for followup office visit.

## 2013-08-10 NOTE — Patient Instructions (Addendum)
TAKE 2 BABY ASPIRIN TODAY  RETURN ASAP FOR VENOUS DOPPLER  Your physician wants you to follow-up in: 4 month ov You will receive a reminder letter in the mail two months in advance. If you don't receive a letter, please call our office to schedule the follow-up appointment.

## 2013-08-12 ENCOUNTER — Other Ambulatory Visit: Payer: Self-pay | Admitting: Cardiology

## 2013-08-14 ENCOUNTER — Other Ambulatory Visit: Payer: Self-pay | Admitting: Cardiology

## 2013-09-14 ENCOUNTER — Ambulatory Visit: Payer: Self-pay

## 2013-10-18 ENCOUNTER — Emergency Department (HOSPITAL_COMMUNITY)
Admission: EM | Admit: 2013-10-18 | Discharge: 2013-10-18 | Disposition: A | Discharge status: Home / Self Care | Payer: Managed Care, Other (non HMO) | Source: Home / Self Care

## 2013-10-18 ENCOUNTER — Emergency Department (INDEPENDENT_AMBULATORY_CARE_PROVIDER_SITE_OTHER): Payer: Managed Care, Other (non HMO)

## 2013-10-18 ENCOUNTER — Encounter (HOSPITAL_COMMUNITY): Payer: Self-pay | Admitting: Emergency Medicine

## 2013-10-18 DIAGNOSIS — J441 Chronic obstructive pulmonary disease with (acute) exacerbation: Secondary | ICD-10-CM

## 2013-10-18 DIAGNOSIS — J069 Acute upper respiratory infection, unspecified: Secondary | ICD-10-CM

## 2013-10-18 MED ORDER — IPRATROPIUM BROMIDE 0.02 % IN SOLN
0.5000 mg | Freq: Once | RESPIRATORY_TRACT | Status: AC
  Administered 2013-10-18: 0.5 mg via RESPIRATORY_TRACT

## 2013-10-18 MED ORDER — ALBUTEROL SULFATE (5 MG/ML) 0.5% IN NEBU
INHALATION_SOLUTION | RESPIRATORY_TRACT | Status: AC
  Filled 2013-10-18: qty 2.5

## 2013-10-18 MED ORDER — SODIUM CHLORIDE 0.9 % IN NEBU
INHALATION_SOLUTION | RESPIRATORY_TRACT | Status: AC
  Filled 2013-10-18: qty 6

## 2013-10-18 MED ORDER — IPRATROPIUM BROMIDE 0.02 % IN SOLN
RESPIRATORY_TRACT | Status: AC
  Filled 2013-10-18: qty 2.5

## 2013-10-18 MED ORDER — ALBUTEROL SULFATE (5 MG/ML) 0.5% IN NEBU
5.0000 mg | INHALATION_SOLUTION | Freq: Once | RESPIRATORY_TRACT | Status: AC
  Administered 2013-10-18: 5 mg via RESPIRATORY_TRACT

## 2013-10-18 NOTE — ED Provider Notes (Signed)
CSN: [id number]     Arrival date & time 10/18/13  1830 History   First MD Initiated Contact with Patient 10/18/13 1919     Chief Complaint  Patient presents with  . URI   (Consider location/radiation/quality/duration/timing/severity/associated sxs/prior Treatment) HPI Comments: 62 year old female was sent to the urgent care for evaluation of breathing problems by  [facility]. Patient states that she has been sick for 2 weeks having a productive cough and trouble breathing. She has known lung disease and has a approximately a 30-pack-year smoking history. She believes she may have had a temperature of 1 time 100. Occasionally has nausea associated with coughing attacks. Coughs are often productive. She has not been using her Ventolin as frequently and she should. She also uses Symbicort.   Past Medical History  Diagnosis Date  . Hypertension     essential hypertension  . Tobacco abuse     smoking about 1/2 pk of cigarettes a day  . Anxiety   . Depression     psychiatrist Dr. Evelene Croon  . Respiratory infection   . Shortness of breath   . Malaise   . Lightheadedness   . Emphysematous COPD     changes in right base along with patchy areas on last x-ray  . COPD (chronic obstructive pulmonary disease)   . Hypertriglyceridemia   . History of echocardiogram 12/24/2006    Est. EF of 55-60% NORMAL LV SYSTOLIC FUNCTION WITH IMPAIRED RELAXATION -- MILD AORTIC SCLEROSIS -- NORMAL PALONARY ARTERY PRESSURE -- NO OLD ECHOS FOR COMPARISON -- Cassell Clement, MD  . History of cardiovascular stress test 08/15/2004    EF of 70% -- Normal stress cardiolite.  There is no evidence of ischemia and there is normal LV function. -- Maisie Fus A. Brackbill. MD  . Diastolic dysfunction    History reviewed. No pertinent past surgical history. No family history on file. History  Substance Use Topics  . Smoking status: Current Every Day Smoker -- 0.50 packs/day for 46 years    Types: Cigarettes  . Smokeless  tobacco: Not on file  . Alcohol Use: No   OB History   Grav Para Term Preterm Abortions TAB SAB Ect Mult Living                 Review of Systems  Constitutional: Positive for fever, activity change and fatigue.  HENT: Positive for congestion, postnasal drip, rhinorrhea and sore throat. Negative for ear discharge and ear pain.   Respiratory: Positive for cough, shortness of breath and wheezing.   Cardiovascular: Negative for chest pain and palpitations.  Gastrointestinal: Positive for nausea.  Genitourinary: Negative.   Musculoskeletal: Negative.   Neurological: Negative.     Allergies  Augmentin; Ceclor; Escitalopram oxalate; Penicillins; Sertraline hcl; and Sulfa drugs cross reactors  Home Medications   Current Outpatient Rx  Name  Route  Sig  Dispense  Refill  . ALPRAZolam (XANAX PO)   Oral   Take 0.5 mg by mouth daily.          [name] losartan-hydrochlorothiazide (HYZAAR) 50-12.5 MG per tablet      TAKE 1 TABLET EVERY DAY   30 tablet   6   . Albuterol (VENTOLIN IN)   Inhalation   Inhale into the lungs. As directed         . aspirin 81 MG tablet   Oral   Take 81 mg by mouth daily.           . divalproex (DEPAKOTE ER) 500 MG  24 hr tablet   Oral   Take 500 mg by mouth daily.          . furosemide (LASIX) 20 MG tablet      TAKE 1 TABLET BY MOUTH EVERY DAY   30 tablet   4   . HYDROcodone-acetaminophen (VICODIN) 5-500 MG per tablet      as directed.         . nadolol (CORGARD) 40 MG tablet      TAKE 0.5 TABLETS (20 MG TOTAL) BY MOUTH DAILY.   45 tablet   1     .Marland KitchenMarland KitchenPatient needs to contact office to schedule  Ap ...   . NORTRIPTYLINE HCL PO   Oral   Take 2 tablets by mouth at bedtime.            BP 135/79  Pulse 86  Temp(Src) 97 F (36.1 C) (Oral)  Resp 20  SpO2 94% Physical Exam  Nursing note and vitals reviewed. Constitutional: She is oriented to person, place, and time. She appears well-developed and well-nourished. No distress.   HENT:  Right Ear: External ear normal.  Left Ear: External ear normal.  Mouth/Throat: No oropharyngeal exudate.  Oropharynx with minor streaky erythema, mild erythema to the soft palate. No exudates or swelling.  Eyes: Conjunctivae and EOM are normal.  Neck: Normal range of motion. Neck supple.  Cardiovascular: Normal rate, regular rhythm and normal heart sounds.   Pulmonary/Chest: Effort normal. She has wheezes.  Bilateral breath sounds are a cacauphon of wheezes and rhonchi; prolonged expiratory phase.  Lymphadenopathy:    She has no cervical adenopathy.  Neurological: She is alert and oriented to person, place, and time. She exhibits normal muscle tone.  Skin: Skin is warm and dry.  Psychiatric: She has a normal mood and affect.    ED Course  Procedures (including critical care time) Labs Review Labs Reviewed - No data to display Imaging Review Dg Chest 2 View  10/18/2013   CLINICAL DATA:  Cough  EXAM: CHEST  2 VIEW  COMPARISON:  05/14/2011  FINDINGS: Nodular densities project over the lung bases previously represented nipple shadows based on nipple markers, and do not appear changed today.  Mildly large lung volumes. Otherwise the lungs appear clear. Cardiac and mediastinal margins appear normal. No pleural effusion. Mild thoracic spondylosis.  IMPRESSION: 1. Emphysema.  Otherwise, no significant abnormalities are observed.   Electronically Signed   By: Herbie Baltimore M.D.   On: 10/18/2013 20:30       MDM   1. URI (upper respiratory infection)   2. COPD with exacerbation    Improved post Duoneb. Improved air movement and decreased wheezing and coarsness.   Continue to use your inhalers Stop smoking F/U with your PCP Pt unable to take OTC meds for URC, runy nose, etc due to side effects.   Hayden Rasmussen, NP 10/18/13 2116

## 2013-10-18 NOTE — ED Notes (Signed)
Pt c/o poss pneumonia onset 2 weeks.... Was sent here by [facility] Sxs include: SOB, wheezing, cough, fevers, nauseas Denies: v/d... Taking OTC mucinex Alert w/no signs of acute distress.

## 2013-10-19 NOTE — ED Provider Notes (Signed)
Medical screening examination/treatment/procedure(s) were performed by a resident physician or non-physician practitioner and as the supervising physician I was immediately available for consultation/collaboration.  Clementeen Graham, MD    Rodolph Bong, MD 10/19/13 [phone]

## 2013-10-22 ENCOUNTER — Other Ambulatory Visit: Payer: Self-pay | Admitting: Cardiology

## 2014-01-04 ENCOUNTER — Telehealth: Payer: Self-pay | Admitting: Cardiology

## 2014-01-04 ENCOUNTER — Encounter: Payer: Self-pay | Admitting: Cardiology

## 2014-01-04 ENCOUNTER — Ambulatory Visit (INDEPENDENT_AMBULATORY_CARE_PROVIDER_SITE_OTHER): Payer: Managed Care, Other (non HMO) | Admitting: Cardiology

## 2014-01-04 VITALS — BP 150/73 | HR 77 | Ht 63.0 in | Wt 143.0 lb

## 2014-01-04 DIAGNOSIS — I119 Hypertensive heart disease without heart failure: Secondary | ICD-10-CM

## 2014-01-04 DIAGNOSIS — J40 Bronchitis, not specified as acute or chronic: Secondary | ICD-10-CM

## 2014-01-04 DIAGNOSIS — I872 Venous insufficiency (chronic) (peripheral): Secondary | ICD-10-CM

## 2014-01-04 DIAGNOSIS — R609 Edema, unspecified: Secondary | ICD-10-CM

## 2014-01-04 DIAGNOSIS — R600 Localized edema: Secondary | ICD-10-CM

## 2014-01-04 LAB — BASIC METABOLIC PANEL
BUN: 10 mg/dL (ref 6–23)
CHLORIDE: 96 meq/L (ref 96–112)
CO2: 27 meq/L (ref 19–32)
Calcium: 9 mg/dL (ref 8.4–10.5)
Creatinine, Ser: 0.5 mg/dL (ref 0.4–1.2)
GFR: 121.03 mL/min (ref >60.00)
GLUCOSE: 96 mg/dL (ref 70–99)
Potassium: 3.6 mEq/L (ref 3.5–5.1)
SODIUM: 132 meq/L — AB (ref 135–145)

## 2014-01-04 NOTE — Progress Notes (Signed)
Catherine Kelley Date of Birth:  [birth year] [address] [place], [place]  [address] [phone]         Fax   [phone]  History of Present Illness: This pleasant 63 year old woman is seen for a scheduled followup office visit.  She has a history of essential hypertension.  She also has a history of frequent episodes of bronchitis and continues to smoke against advice.  She has had a history of depression.  She has been having pain in her lower extremities.  She had a venous Doppler at her last office visit which did not show any evidence of DVT of the lower extremities.  She has continued to have a lot of peripheral edema.  Since we last saw her [facility] a bad urinary tract infection over the Christmas holidays.  She has had some recent falls.  Current Outpatient Prescriptions  Medication Sig Dispense Refill  . Albuterol (VENTOLIN IN) Inhale into the lungs. As directed      . ALPRAZolam (XANAX PO) Take 0.5 mg by mouth daily.       [name] aspirin 81 MG tablet Take 81 mg by mouth daily. Take 2 tabs daily      . divalproex (DEPAKOTE ER) 500 MG 24 hr tablet Take 500 mg by mouth daily.       . furosemide (LASIX) 20 MG tablet TAKE 2 TABLETS BY MOUTH EVERY DAY      . HYDROcodone-acetaminophen (VICODIN) 5-500 MG per tablet as directed.      [name] losartan-hydrochlorothiazide (HYZAAR) 50-12.5 MG per tablet 1/2 pill daily      . nadolol (CORGARD) 40 MG tablet TAKE 0.5 TABLETS (20 MG TOTAL) BY MOUTH DAILY.  45 tablet  1  . NORTRIPTYLINE HCL PO Take 2 tablets by mouth at bedtime.        [name] doxycycline (VIBRA-TABS) 100 MG tablet As directed       No current facility-administered medications for this visit.    Allergies  Allergen Reactions  . Augmentin [Amoxicillin-Pot Clavulanate]   . Ceclor [Cefaclor]   . Escitalopram Oxalate   . Penicillins   . Sertraline Hcl   . Sulfa Drugs Cross Reactors     Patient Active Problem List   Diagnosis Date  Noted  . Chest pain 05/09/2011    Priority: High  . Bronchitis 05/09/2011    Priority: High  . Venous insufficiency (chronic) (peripheral) 09/29/2012  . Cellulitis and abscess of foot, except toes 09/21/2012  . Edema 03/09/2012  . Benign hypertensive heart disease without heart failure 05/09/2011  . Dyslipidemia 05/09/2011  . Depression 05/09/2011  . Osteoarthritis 05/09/2011    History  Smoking status  . Current Every Day Smoker -- 0.50 packs/day for 46 years  . Types: Cigarettes  Smokeless tobacco  . Not on file    History  Alcohol Use No    No family history on file.  Review of Systems: Constitutional: no fever chills diaphoresis or fatigue or change in weight.  Head and neck: no hearing loss, no epistaxis, no photophobia or visual disturbance. Respiratory: No cough, shortness of breath or wheezing. Cardiovascular: No chest pain peripheral edema, palpitations. Gastrointestinal: No abdominal distention, no abdominal pain, no change in bowel habits hematochezia or melena. Genitourinary: No dysuria, no frequency, no urgency, no nocturia. Musculoskeletal:No arthralgias, no back pain, no gait disturbance or myalgias. Neurological: No dizziness, no headaches, no numbness, no seizures, no syncope, no weakness, no  tremors. Hematologic: No lymphadenopathy, no easy bruising. Psychiatric: No confusion, no hallucinations, no sleep disturbance.    Physical Exam: Filed Vitals:   01/04/14 1047  BP: 150/73  Pulse: 77   the general appearance reveals a well-developed well-nourished woman in no distress.The head and neck exam reveals pupils equal and reactive.  Extraocular movements are full.  There is no scleral icterus.  The mouth and pharynx are normal.  The neck is supple.  The carotids reveal no bruits.  The jugular venous pressure is normal.  The  thyroid is not enlarged.  There is no lymphadenopathy.  The chest is clear to percussion and auscultation.  There are no rales or  rhonchi.  Expansion of the chest is symmetrical.  The precordium is quiet.  The first heart sound is normal.  The second heart sound is physiologically split.  There is no murmur gallop rub or click.  There is no abnormal lift or heave.  The abdomen is soft and nontender.  The bowel sounds are normal.  The liver and spleen are not enlarged.  There are no abdominal masses.  There are no abdominal bruits.  Extremities reveal good pedal pulses.  There is 2+ bilateral pretibial and pedal edema.  There is tenderness and slight swelling of the left calf Homans sign is negative  There is no cyanosis or clubbing.  Strength is normal and symmetrical in all extremities.  There is no lateralizing weakness.  There are no sensory deficits.  The skin is warm and dry.  There is no rash.     Assessment / Plan: Continue same medication except increase furosemide to 40 mg daily.  Check basal metabolic panel today. Recheck in 4 months for office visit

## 2014-01-04 NOTE — Telephone Encounter (Signed)
Discussed with  Dr. Mare Ferrari and no antibiotic continue to watch. If any changes call MD who Rx'd antibiotic originally

## 2014-01-04 NOTE — Assessment & Plan Note (Addendum)
Patient continues to smoke against advice.  Continues to cough.  I urged her to stop smoking altogether.

## 2014-01-04 NOTE — Assessment & Plan Note (Signed)
When she was seen at [facility] her blood pressure was low and the dose of her losartan HCT was halved at that point.

## 2014-01-04 NOTE — Patient Instructions (Signed)
Will obtain labs today and call you with the results (bmet)  [place] ([place]) TO [address] [place] physician wants you to follow-up in: [place] will receive a reminder letter in the mail two months in advance. If you don't receive a letter, please call our office to schedule the follow-up appointment.

## 2014-01-04 NOTE — Progress Notes (Signed)
Quick Note:  Please report to patient. The recent labs are stable. Continue same medication and careful diet. ______ 

## 2014-01-04 NOTE — Assessment & Plan Note (Signed)
The patient has significant bilateral pretibial and pedal edema.  She has been taking Lasix 20 mg daily.  We are checking a basal metabolic panel today and increasing her Lasix to 40 mg a day.

## 2014-01-04 NOTE — Telephone Encounter (Signed)
New Message  Pt called states that she had an infection in her arm.. requesting to have an antibiotic called in for cellulitis// Please assist

## 2014-01-06 ENCOUNTER — Ambulatory Visit (INDEPENDENT_AMBULATORY_CARE_PROVIDER_SITE_OTHER): Payer: Managed Care, Other (non HMO) | Admitting: Internal Medicine

## 2014-01-06 VITALS — BP 142/82 | HR 82 | Temp 99.0°F | Resp 16 | Ht 63.5 in | Wt 142.2 lb

## 2014-01-06 DIAGNOSIS — L02419 Cutaneous abscess of limb, unspecified: Secondary | ICD-10-CM

## 2014-01-06 DIAGNOSIS — L03116 Cellulitis of left lower limb: Secondary | ICD-10-CM

## 2014-01-06 DIAGNOSIS — L03115 Cellulitis of right lower limb: Secondary | ICD-10-CM

## 2014-01-06 DIAGNOSIS — I872 Venous insufficiency (chronic) (peripheral): Secondary | ICD-10-CM

## 2014-01-06 DIAGNOSIS — F17200 Nicotine dependence, unspecified, uncomplicated: Secondary | ICD-10-CM

## 2014-01-06 DIAGNOSIS — R609 Edema, unspecified: Secondary | ICD-10-CM

## 2014-01-06 DIAGNOSIS — L03119 Cellulitis of unspecified part of limb: Secondary | ICD-10-CM

## 2014-01-06 DIAGNOSIS — I70209 Unspecified atherosclerosis of native arteries of extremities, unspecified extremity: Secondary | ICD-10-CM

## 2014-01-06 MED ORDER — CEPHALEXIN 500 MG PO CAPS: 500.0000 mg | ORAL_CAPSULE | Freq: Four times a day (QID) | ORAL | Status: DC

## 2014-01-06 NOTE — Progress Notes (Addendum)
Subjective:    Patient ID: Catherine Kelley, female    DOB: 1951-11-18, 63 y.o.   MRN: [id number] This chart was scribed for Catherine Lin, MD by Anastasia Pall, ED Scribe. This patient was seen in room 09 and the patient's care was started at 1:33 PM.  Chief Complaint  Patient presents with  . Cellulitis    x 1 week   . Hypotension  . Wound Check    left forearm  . Chest Pain    right sided with inspiration  . Fall    bruising  left arm, right arm, left lower leg, would like x-ray on left leg    HPI [name] is a 63 y.o. female Pt with h/o cellulitis, smoking is here for cellulitis check, onset 1 week ago. She reports bilateral leg swelling, with surrounding redness and cracking. She reports constant pain over her swollen LE due to cracking. She reports low grade fever at [facility] today. She reports low blood pressure, lowest 42. She has had recurrent problems with cellulitis of the lower extremities with recurrent problems with edema.  She reports falling recently, due to her low BP, resulting in bruising over her bilateral arms, and left LE. She reports right sided chest pain with deep breathing ever since the fall. She denies breaking a rib. She reports having DVT over the same area. She reports left knee pain from the fall. Primary care provider had adjusted her blood pressure meds.  She denies knowing if she is allergic to Penicillin, denies rash. She denies allergy to Augmentin. She reports being okay with Cephalexin. She reports taking Depakote for her h/o depression and anxiety-Dr. Toy Care  Dr. Mare Ferrari - Cardiologist.  PCP - Maximino Greenland, MD  Patient Active Problem List   Diagnosis Date Noted  . Venous insufficiency (chronic) (peripheral) 09/29/2012  . Cellulitis and abscess of foot, except toes 09/21/2012  . Edema 03/09/2012  . Benign hypertensive heart disease without heart failure 05/09/2011  . Chest pain 05/09/2011  . Dyslipidemia 05/09/2011  . Depression 05/09/2011  .  Osteoarthritis 05/09/2011  . Bronchitis 05/09/2011   Prior to Admission medications   Medication Sig Start Date End Date Taking? Authorizing Provider  Albuterol (VENTOLIN IN) Inhale into the lungs. As directed   Yes Historical Provider, MD  ALPRAZolam (XANAX PO) Take 0.5 mg by mouth at bedtime and may repeat dose one time if needed.    Yes Historical Provider, MD  aspirin 81 MG tablet Take 81 mg by mouth daily. Take 2 tabs daily   Yes Historical Provider, MD  divalproex (DEPAKOTE ER) 500 MG 24 hr tablet Take 500 mg by mouth daily.  02/22/12  Yes Historical Provider, MD  furosemide (LASIX) 20 MG tablet TAKE 2 TABLETS BY MOUTH EVERY DAY 08/14/13  Yes [name], MD  HYDROcodone-acetaminophen (VICODIN) 5-500 MG per tablet as directed. 02/13/12  Yes Historical Provider, MD  losartan-hydrochlorothiazide (HYZAAR) 50-12.5 MG per tablet 1/2 pill daily 08/12/13  Yes [name], MD  nadolol (CORGARD) 40 MG tablet TAKE 0.5 TABLETS (20 MG TOTAL) BY MOUTH DAILY. 07/25/13  Yes [name], MD  NORTRIPTYLINE HCL PO Take 2 tablets by mouth at bedtime.     Yes Historical Provider, MD    Review of Systems  Constitutional: Positive for fever (low grade).  Cardiovascular: Positive for leg swelling (bilateral over ankles, feet, with associated pain).  Skin: Positive for color change (redness over bilateral LE around ankles, feet) and wound (cracks over swollen bilateral LE).  Objective:   Physical Exam  Nursing note and vitals reviewed. Constitutional: She is oriented to person, place, and time. She appears well-developed and well-nourished. No distress.  HENT:  Head: Normocephalic and atraumatic.  Eyes: Conjunctivae and EOM are normal. Pupils are equal, round, and reactive to light.  Neck: Neck supple.  Cardiovascular: Normal rate, regular rhythm, normal heart sounds and intact distal pulses.   No murmur heard.  Full peripheral pulses. There is synosis of both LE.   Pulmonary/Chest:  Effort normal and breath sounds normal. No respiratory distress. She has no wheezes. She has no rales. She exhibits tenderness.  Tender in anterior axillar line on right, without rib defect.   Musculoskeletal: Normal range of motion. She exhibits edema (2+ pitting edema of bilateral LE.) and tenderness.  Shoulder and knee with FROM. Left patellar tendon slightly swolllen, ttp.   Neurological: She is alert and oriented to person, place, and time.  Skin: Skin is warm and dry.  Ecchymosis on left upper arm, right shoulder, and left knee. Redness from mid calf down bilateral LE, consistent with cellulitis. Cracking on both plantar aspects of both heels, with marked skin dryness.  Psychiatric: She has a normal mood and affect. Her behavior is normal.   BP 142/82  Pulse 82  Temp(Src) 99 F (37.2 C) (Oral)  Resp 16  Ht 5' 3.5" (1.613 m)  Wt 142 lb 3.2 oz (64.501 kg)  BMI 24.79 kg/m2  SpO2 99%     Assessment & Plan:   Cellulitis of leg, left - Plan: cephALEXin (KEFLEX) 500 MG capsule  Cellulitis of leg, right - Plan: cephALEXin (KEFLEX) 500 MG capsule  Edema  Atherosclerotic peripheral vascular disease  Nicotine addiction  Venous insufficiency (chronic) (peripheral)  Meds ordered this encounter  Medications  . cephALEXin (KEFLEX) 500 MG capsule    Sig: Take 1 capsule (500 mg total) by mouth 4 (four) times daily.    Dispense:  40 capsule    Refill:  0   2 continue Lasix to resolve edema Elevate legs Reassured regarding blood pressure which is normal Stressed to her the need to stop smoking/she will place a vascular procedure for her lower extremities in the near future she does not discontinue smoking  Smoking cessation disc 76min-she would not commit to any specific plan  I have completed the patient encounter in its entirety as documented by the scribe, with editing by me where necessary. Robert P. Laney Pastor, M.D.

## 2014-01-10 ENCOUNTER — Other Ambulatory Visit: Payer: Self-pay | Admitting: Cardiology

## 2014-01-18 ENCOUNTER — Other Ambulatory Visit: Payer: Self-pay | Admitting: Cardiology

## 2014-01-19 ENCOUNTER — Ambulatory Visit (INDEPENDENT_AMBULATORY_CARE_PROVIDER_SITE_OTHER): Payer: Managed Care, Other (non HMO) | Admitting: Emergency Medicine

## 2014-01-19 VITALS — BP 130/80 | HR 83 | Temp 98.0°F | Resp 18 | Ht 63.5 in | Wt 142.2 lb

## 2014-01-19 DIAGNOSIS — L03115 Cellulitis of right lower limb: Secondary | ICD-10-CM

## 2014-01-19 DIAGNOSIS — L03119 Cellulitis of unspecified part of limb: Secondary | ICD-10-CM

## 2014-01-19 DIAGNOSIS — R609 Edema, unspecified: Secondary | ICD-10-CM

## 2014-01-19 DIAGNOSIS — L02419 Cutaneous abscess of limb, unspecified: Secondary | ICD-10-CM

## 2014-01-19 NOTE — Progress Notes (Signed)
[facility] [address]  [place] [address] [phone]  Date:  01/19/2014   Name:  Catherine Kelley   DOB:  October 30, 1951   MRN:  [id number]  PCP:  Maximino Greenland, MD    Chief Complaint: Follow-up   History of Present Illness:  Catherine Kelley is a 63 y.o. very pleasant female patient who presents with the following:  Seen by Dr Laney Pastor for cellulitis and her infection is improved.  No further fever or chills.  Skin is itchy and has a rash in spots.  Trace of edema.  No improvement with over the counter medications or other home remedies. Denies other complaint or health concern today.   Patient Active Problem List   Diagnosis Date Noted  . Venous insufficiency (chronic) (peripheral) 09/29/2012  . Cellulitis and abscess of foot, except toes 09/21/2012  . Edema 03/09/2012  . Benign hypertensive heart disease without heart failure 05/09/2011  . Chest pain 05/09/2011  . Dyslipidemia 05/09/2011  . Depression 05/09/2011  . Osteoarthritis 05/09/2011  . Bronchitis 05/09/2011    Past Medical History  Diagnosis Date  . Hypertension     essential hypertension  . Tobacco abuse     smoking about 1/2 pk of cigarettes a day  . Anxiety   . Depression     psychiatrist Dr. Toy Care  . Respiratory infection   . Shortness of breath   . Malaise   . Lightheadedness   . Emphysematous COPD     changes in right base along with patchy areas on last x-ray  . COPD (chronic obstructive pulmonary disease)   . Hypertriglyceridemia   . History of echocardiogram 12/24/2006    Est. EF of 71-24% NORMAL LV SYSTOLIC FUNCTION WITH IMPAIRED RELAXATION -- MILD AORTIC SCLEROSIS -- NORMAL PALONARY ARTERY PRESSURE -- NO OLD ECHOS FOR COMPARISON -- [name], MD  . History of cardiovascular stress test 08/15/2004    EF of 70% -- Normal stress cardiolite.  There is no evidence of ischemia and there is normal LV function. -- [name]. MD  . Diastolic dysfunction   . Allergy   .  Asthma   . Heart murmur     Past Surgical History  Procedure Laterality Date  . Tubal ligation      History  Substance Use Topics  . Smoking status: Current Every Day Smoker -- 0.50 packs/day for 46 years    Types: Cigarettes  . Smokeless tobacco: Not on file  . Alcohol Use: No    Family History  Problem Relation Age of Onset  . Heart disease Mother   . Stroke Mother   . Heart disease Father   . Hyperlipidemia Father   . Hypertension Father   . Cancer Maternal Aunt     Allergies  Allergen Reactions  . Augmentin [Amoxicillin-Pot Clavulanate]   . Ceclor [Cefaclor]   . Escitalopram Oxalate   . Penicillins   . Sertraline Hcl   . Sulfa Drugs Cross Reactors     Medication list has been reviewed and updated.  Current Outpatient Prescriptions on File Prior to Visit  Medication Sig Dispense Refill  . Albuterol (VENTOLIN IN) Inhale into the lungs. As directed      . ALPRAZolam (XANAX PO) Take 0.5 mg by mouth at bedtime and may repeat dose one time if needed.       [name] aspirin 81 MG tablet Take 81 mg by mouth daily. Take 2 tabs daily      .  divalproex (DEPAKOTE ER) 500 MG 24 hr tablet Take 500 mg by mouth daily.       . furosemide (LASIX) 20 MG tablet Take 2 tablets (40 mg total) by mouth daily.  60 tablet  4  . HYDROcodone-acetaminophen (VICODIN) 5-500 MG per tablet as directed.      [name] losartan-hydrochlorothiazide (HYZAAR) 50-12.5 MG per tablet 1/2 pill daily      . nadolol (CORGARD) 40 MG tablet TAKE 1/2 TABLET BY MOUTH EVERY DAY  45 tablet  1  . NORTRIPTYLINE HCL PO Take 2 tablets by mouth at bedtime.         No current facility-administered medications on file prior to visit.    Review of Systems:  As per HPI, otherwise negative.    Physical Examination: Filed Vitals:   01/19/14 1554  BP: 130/80  Pulse: 83  Temp: 98 F (36.7 C)  Resp: 18   Filed Vitals:   01/19/14 1554  Height: 5' 3.5" (1.613 m)  Weight: 142 lb 3.2 oz (64.501 kg)   Body mass index is  24.79 kg/(m^2). Ideal Body Weight: Weight in (lb) to have BMI = 25: 143.1   GEN: WDWN, NAD, Non-toxic, Alert & Oriented x 3 HEENT: Atraumatic, Normocephalic.  Ears and Nose: No external deformity. EXTR: No clubbing/cyanosis/edema  Resolved cellulitis. NEURO: Normal gait.  PSYCH: Normally interactive. Conversant. Not depressed or anxious appearing.  Calm demeanor.    Assessment and Plan: Improving cellulitis Lotion for day skin.   Signed,  Ellison Carwin, MD

## 2014-03-08 ENCOUNTER — Ambulatory Visit (INDEPENDENT_AMBULATORY_CARE_PROVIDER_SITE_OTHER): Payer: Managed Care, Other (non HMO) | Admitting: Physician Assistant

## 2014-03-08 VITALS — BP 118/78 | HR 103 | Temp 98.3°F | Resp 16 | Ht 62.5 in | Wt 138.0 lb

## 2014-03-08 DIAGNOSIS — R059 Cough, unspecified: Secondary | ICD-10-CM

## 2014-03-08 DIAGNOSIS — R05 Cough: Secondary | ICD-10-CM

## 2014-03-08 DIAGNOSIS — J40 Bronchitis, not specified as acute or chronic: Secondary | ICD-10-CM

## 2014-03-08 DIAGNOSIS — R609 Edema, unspecified: Secondary | ICD-10-CM

## 2014-03-08 MED ORDER — AZITHROMYCIN 500 MG PO TABS: 500.0000 mg | ORAL_TABLET | Freq: Every day | ORAL | Status: DC

## 2014-03-08 MED ORDER — HYDROCOD POLST-CHLORPHEN POLST 10-8 MG/5ML PO LQCR: 5.0000 mL | Freq: Two times a day (BID) | ORAL | Status: DC | PRN

## 2014-03-08 NOTE — Progress Notes (Signed)
I have examined this patient along with the student and agree.  

## 2014-03-08 NOTE — Progress Notes (Signed)
Subjective:    Patient ID: Catherine Kelley, female    DOB: 20-Dec-1950, 63 y.o.   MRN: [id number]  HPI  63y.o. Female with hx of COPD, asthma, and 39 pack/yr presents with cough x 1 month.  Cough is productive of yellowish green sputum and without blood.  Pt describes coughing spells until almost vomiting as well as having stress incontinence for which she has been wearing pads.  Pt with associated congestion, rhinorrhea, and sore throat.  She has tried mucinex for past week with no relief.  Husband was sick at home with URI symptoms before cough started.  She also has R mid axillary costal tenderness in the past 1-2 weeks made worse from coughing and deep breathing.  Pt has felt like she has had a fever on and off but has not measured.  Tylenol has helped fever.  Denies diarrhea, chills, unexplained weight loss/gain, headache.  Additionally, she describes new leg swelling starting today but denies hx of heart failure, PND, orthopnea, increasing DOE.  Flu shot and pneumovax received this year.       Review of Systems  Constitutional: Positive for fever (subjective; not measured). Negative for chills, activity change and unexpected weight change.  HENT: Positive for congestion, rhinorrhea and sore throat. Negative for ear pain, facial swelling, hearing loss, postnasal drip, sinus pressure and sneezing.   Eyes: Negative for discharge and visual disturbance.  Respiratory: Positive for cough. Negative for chest tightness, shortness of breath and wheezing.   Cardiovascular: Positive for leg swelling. Negative for chest pain and palpitations.  Gastrointestinal: Negative for nausea, vomiting, abdominal pain, diarrhea, constipation and abdominal distention.  Endocrine: Negative.   Genitourinary: Negative.   Musculoskeletal: Negative.   Skin: Negative for rash.  Neurological: Negative.   Hematological: Negative.        Objective:   Physical Exam  Constitutional: She is oriented to person, place, and  time. She appears well-developed and well-nourished. No distress.  HENT:  Right Ear: External ear normal.  Left Ear: External ear normal.  Nose: Nose normal.  Mouth/Throat: Oropharynx is clear and moist. No oropharyngeal exudate.  Eyes: Conjunctivae are normal. Pupils are equal, round, and reactive to light. Right eye exhibits no discharge. Left eye exhibits no discharge.  Neck: No JVD present.  Cardiovascular: Normal rate, regular rhythm and intact distal pulses.   Murmur (systolic flow murmur is known to pt and has been previously evaluated) heard. Pulmonary/Chest: Effort normal. No respiratory distress. She has no wheezes.  Coarse sounds heard at both bases on inspiration  Abdominal: Soft. Bowel sounds are normal. She exhibits no distension. There is no tenderness.  Musculoskeletal: She exhibits edema (pitting edema 2+ bilat to lower calf; no evidence of cellulitis).  Lymphadenopathy:    She has no cervical adenopathy.  Neurological: She is alert and oriented to person, place, and time. No cranial nerve deficit.  Skin: No rash noted. She is not diaphoretic.          Assessment & Plan:   1. Cough 2. Bronchitis With smoking hx and known COPD, this is likely a response to residual viral infection.  Will cover with azithromycin for possible bacterial etiology given increased risk due to smoking hx.  Pt was told to schedule a follow up visit with PCP as soon as possible.  If symptoms get worse, pt will return for CXR and further treatment.  Will try and control cough with prescribed medication. - chlorpheniramine-HYDROcodone (TUSSIONEX PENNKINETIC ER) 10-8 MG/5ML LQCR; Take 5 mLs  by mouth every 12 (twelve) hours as needed for cough (cough).  Dispense: 100 mL; Refill: 0 - azithromycin (ZITHROMAX) 500 MG tablet; Take 1 tablet (500 mg total) by mouth daily.  Dispense: 3 tablet; Refill: 0  3. Edema Thought to be from recent inactivity due to illness.  Doubt CHF due to negative history and  physical elements including lack of increasing DOE, orthopnea, PND, hx of CHF, JVD, basilar crackles

## 2014-03-08 NOTE — Patient Instructions (Signed)
Schedule follow up visit with your regular doctor.  If symptoms worsen, and cannot get in with your regular doctor, come back here.  Get plenty of rest, drink plenty of fluids, and use tylenol as needed for fever.

## 2014-04-25 ENCOUNTER — Ambulatory Visit (INDEPENDENT_AMBULATORY_CARE_PROVIDER_SITE_OTHER): Payer: Managed Care, Other (non HMO) | Admitting: Internal Medicine

## 2014-04-25 ENCOUNTER — Ambulatory Visit: Payer: Managed Care, Other (non HMO)

## 2014-04-25 VITALS — BP 112/72 | HR 84 | Temp 97.9°F | Resp 18 | Ht 63.0 in | Wt 133.4 lb

## 2014-04-25 DIAGNOSIS — R059 Cough, unspecified: Secondary | ICD-10-CM

## 2014-04-25 DIAGNOSIS — R05 Cough: Secondary | ICD-10-CM

## 2014-04-25 DIAGNOSIS — J441 Chronic obstructive pulmonary disease with (acute) exacerbation: Secondary | ICD-10-CM

## 2014-04-25 DIAGNOSIS — J189 Pneumonia, unspecified organism: Secondary | ICD-10-CM

## 2014-04-25 DIAGNOSIS — R079 Chest pain, unspecified: Secondary | ICD-10-CM

## 2014-04-25 DIAGNOSIS — R509 Fever, unspecified: Secondary | ICD-10-CM

## 2014-04-25 LAB — POCT CBC
GRANULOCYTE PERCENT: 71.2 % (ref 37–80)
HCT, POC: 41.8 % (ref 37.7–47.9)
Hemoglobin: 13.4 g/dL (ref 12.2–16.2)
LYMPH, POC: 2.8 (ref 0.6–3.4)
MCH, POC: 29.8 pg (ref 27–31.2)
MCHC: 32.1 g/dL (ref 31.8–35.4)
MCV: 92.8 fL (ref 80–97)
MID (cbc): 0.9 (ref 0–0.9)
MPV: 8.6 fL (ref 0–99.8)
PLATELET COUNT, POC: 389 10*3/uL (ref 142–424)
POC GRANULOCYTE: 9.1 — AB (ref 2–6.9)
POC LYMPH %: 21.6 % (ref 10–50)
POC MID %: 7.2 % (ref 0–12)
RBC: 4.5 M/uL (ref 4.04–5.48)
RDW, POC: 14.4 %
WBC: 12.8 10*3/uL — AB (ref 4.6–10.2)

## 2014-04-25 MED ORDER — AZITHROMYCIN 500 MG PO TABS: 500.0000 mg | ORAL_TABLET | Freq: Every day | ORAL | Status: DC

## 2014-04-25 MED ORDER — HYDROCODONE-ACETAMINOPHEN 7.5-325 MG/15ML PO SOLN: 5.0000 mL | Freq: Four times a day (QID) | ORAL | Status: DC | PRN

## 2014-04-25 NOTE — Progress Notes (Signed)
   Subjective:    Patient ID: Catherine Kelley, female    DOB: 21-Apr-1951, 63 y.o.   MRN: [id number]  HPI 63 yr old female is here today with complaints of greenish brown phlegm, headaches, fever, chest congestion, chills and  shortness of breath for about two months. She states she has tried Mucinex, Advil and Tylenol with no relief. She states she has some Tussionex left over that provided some relief. She states she is also experiencing chest and back when coughing. She states she has had Bronchitis and Pneumonia in the past. Feels ache in right chest, maybe hx of hemoptysis. She quit smoking 1 week ago for good. States she can not get to see her doctor because front office staff will not communicate with her. Chart review cxr last year showed emphysema. Is working. Review of Systems Had check up and lab work 1 month ago with dr. Baird [facility]    Objective:   Physical Exam  Constitutional: She is oriented to person, place, and time. She appears well-developed and well-nourished. No distress.  HENT:  Head: Normocephalic.  Right Ear: External ear normal.  Left Ear: External ear normal.  Nose: Nose normal.  Eyes: EOM are normal. Pupils are equal, round, and reactive to light. No scleral icterus.  Neck: Normal range of motion. Neck supple.  Cardiovascular: Normal rate, regular rhythm, S1 normal, S2 normal and intact distal pulses.  Exam reveals distant heart sounds. Exam reveals no gallop.   No murmur heard. No bruit heard  Pulmonary/Chest: Tachypnea noted. No respiratory distress. She has decreased breath sounds. She has no wheezes. She has no rhonchi. She has no rales.    Right chest lower decreased sounds/aches this area.  Neurological: She is alert and oriented to person, place, and time. She has normal strength. No cranial nerve deficit or sensory deficit. Coordination abnormal.  Skin: She is not diaphoretic.  Psychiatric: She has a normal mood and affect. Her behavior is normal.   [facility]  reading (PRIMARY) by  Dr.Brittian Renaldo RLL and RML infiltrate, no definite effusion  Results for orders placed in visit on 04/25/14  POCT CBC      Result Value Ref Range   WBC 12.8 (*) 4.6 - 10.2 K/uL   Lymph, poc 2.8  0.6 - 3.4   POC LYMPH PERCENT 21.6  10 - 50 %L   MID (cbc) 0.9  0 - 0.9   POC MID % 7.2  0 - 12 %M   POC Granulocyte 9.1 (*) 2 - 6.9   Granulocyte percent 71.2  37 - 80 %G   RBC 4.50  4.04 - 5.48 M/uL   Hemoglobin 13.4  12.2 - 16.2 g/dL   HCT, POC 41.8  37.7 - 47.9 %   MCV 92.8  80 - 97 fL   MCH, POC 29.8  27 - 31.2 pg   MCHC 32.1  31.8 - 35.4 g/dL   RDW, POC 14.4     Platelet Count, POC 389  142 - 424 K/uL   MPV 8.6  0 - 99.8 fL    Further hx reveals no clear allergy to penicillin, uses keflex often        Assessment & Plan:  Cough/Fever/Bacterial pneumonia Zithromax 500mg  5d/Lortab elixir Rest/Hydrate RTC 2d/Consider Admission if worse

## 2014-04-25 NOTE — Patient Instructions (Signed)

## 2014-04-28 ENCOUNTER — Ambulatory Visit: Payer: Managed Care, Other (non HMO)

## 2014-04-28 ENCOUNTER — Ambulatory Visit (INDEPENDENT_AMBULATORY_CARE_PROVIDER_SITE_OTHER): Payer: Managed Care, Other (non HMO) | Admitting: Internal Medicine

## 2014-04-28 VITALS — BP 120/80 | HR 82 | Temp 98.0°F | Resp 16 | Ht 63.5 in | Wt 133.0 lb

## 2014-04-28 DIAGNOSIS — J189 Pneumonia, unspecified organism: Secondary | ICD-10-CM

## 2014-04-28 DIAGNOSIS — J90 Pleural effusion, not elsewhere classified: Secondary | ICD-10-CM

## 2014-04-28 LAB — POCT CBC
Granulocyte percent: 75.2 %G (ref 37–80)
HEMATOCRIT: 47.5 % (ref 37.7–47.9)
HEMOGLOBIN: 15.4 g/dL (ref 12.2–16.2)
LYMPH, POC: 1.6 (ref 0.6–3.4)
MCH: 30.7 pg (ref 27–31.2)
MCHC: 32.4 g/dL (ref 31.8–35.4)
MCV: 94.9 fL (ref 80–97)
MID (cbc): 0.8 (ref 0–0.9)
MPV: 8.1 fL (ref 0–99.8)
POC Granulocyte: 7.1 — AB (ref 2–6.9)
POC LYMPH PERCENT: 16.5 %L (ref 10–50)
POC MID %: 8.3 % (ref 0–12)
Platelet Count, POC: 352 10*3/uL (ref 142–424)
RBC: 5.01 M/uL (ref 4.04–5.48)
RDW, POC: 13.6 %
WBC: 9.4 10*3/uL (ref 4.6–10.2)

## 2014-04-28 MED ORDER — LEVOFLOXACIN 500 MG PO TABS: 500.0000 mg | ORAL_TABLET | Freq: Every day | ORAL | Status: DC

## 2014-04-28 NOTE — Progress Notes (Signed)
   Subjective:    Patient ID: Catherine Kelley, female    DOB: 11-30-1951, 63 y.o.   MRN: [id number]  HPI Catherine Kelley was seen in our clinic on 04/25/14 in which she was diagnosed with Pneumonia. She complained of cough and chest pain. Today she is here for a follow up in which she still has a painful cough. Catherine Kelley states she has a hx of pneumonia in the past. She denies any current night sweats or chills. She complains of loss of appetite, nausea, constipation, and fatigue. Catherine Kelley states she hasn't had much relief since starting Zithromax 500mg  qd. CXR has infiltrate and effusion. Allergy to penicillin--hives, cannot use injectibles  Review of Systems Just quit smoking    Objective:   Physical Exam  Constitutional: She is oriented to person, place, and time. She appears well-nourished. No distress.  HENT:  Head: Normocephalic.  Eyes: EOM are normal. No scleral icterus.  Neck: Normal range of motion.  Cardiovascular: Normal rate, regular rhythm and normal heart sounds.   Pulmonary/Chest: Effort normal. No respiratory distress. She has no wheezes. She has rales. She exhibits tenderness.  Abdominal: Soft.  Neurological: She is alert and oriented to person, place, and time. She exhibits normal muscle tone. Coordination normal.  Psychiatric: She has a normal mood and affect. Her behavior is normal. Judgment and thought content normal.   [facility] reading (PRIMARY) by  Dr.Guest infiltrate and effusion no improvement   Improved WBC  Results for orders placed in visit on 04/28/14  POCT CBC      Result Value Ref Range   WBC 9.4  4.6 - 10.2 K/uL   Lymph, poc 1.6  0.6 - 3.4   POC LYMPH PERCENT 16.5  10 - 50 %L   MID (cbc) 0.8  0 - 0.9   POC MID % 8.3  0 - 12 %M   POC Granulocyte 7.1 (*) 2 - 6.9   Granulocyte percent 75.2  37 - 80 %G   RBC 5.01  4.04 - 5.48 M/uL   Hemoglobin 15.4  12.2 - 16.2 g/dL   HCT, POC 47.5  37.7 - 47.9 %   MCV 94.9  80 - 97 fL   MCH, POC 30.7  27 - 31.2 pg   MCHC 32.4  31.8 - 35.4  g/dL   RDW, POC 13.6     Platelet Count, POC 352  142 - 424 K/uL   MPV 8.1  0 - 99.8 fL   Oximetry 94% with finger nail polish on   Clinically no better no worse Assessment & Plan:  Add levaquin 500mg  qd 7d/continue zithromax Rest /Hydrate Use probiotics RTC Monday am June 1st/Sooner if worse

## 2014-04-28 NOTE — Patient Instructions (Signed)
Pneumonia, Adult °Pneumonia is an infection of the lungs.  °CAUSES °Pneumonia may be caused by bacteria or a virus. Usually, these infections are caused by breathing infectious particles into the lungs (respiratory tract). °SYMPTOMS  °· Cough. °· Fever. °· Chest pain. °· Increased rate of breathing. °· Wheezing. °· Mucus production. °DIAGNOSIS  °If you have the common symptoms of pneumonia, your caregiver will typically confirm the diagnosis with a chest X-ray. The X-ray will show an abnormality in the lung (pulmonary infiltrate) if you have pneumonia. Other tests of your blood, urine, or sputum may be done to find the specific cause of your pneumonia. Your caregiver may also do tests (blood gases or pulse oximetry) to see how well your lungs are working. °TREATMENT  °Some forms of pneumonia may be spread to other people when you cough or sneeze. You may be asked to wear a mask before and during your exam. Pneumonia that is caused by bacteria is treated with antibiotic medicine. Pneumonia that is caused by the influenza virus may be treated with an antiviral medicine. Most other viral infections must run their course. These infections will not respond to antibiotics.  °PREVENTION °A pneumococcal shot (vaccine) is available to prevent a common bacterial cause of pneumonia. This is usually suggested for: °· People over 65 years old. °· Patients on chemotherapy. °· People with chronic lung problems, such as bronchitis or emphysema. °· People with immune system problems. °If you are over 65 or have a high risk condition, you may receive the pneumococcal vaccine if you have not received it before. In some countries, a routine influenza vaccine is also recommended. This vaccine can help prevent some cases of pneumonia. You may be offered the influenza vaccine as part of your care. °If you smoke, it is time to quit. You may receive instructions on how to stop smoking. Your caregiver can provide medicines and counseling to  help you quit. °HOME CARE INSTRUCTIONS  °· Cough suppressants may be used if you are losing too much rest. However, coughing protects you by clearing your lungs. You should avoid using cough suppressants if you can. °· Your caregiver may have prescribed medicine if he or she thinks your pneumonia is caused by a bacteria or influenza. Finish your medicine even if you start to feel better. °· Your caregiver may also prescribe an expectorant. This loosens the mucus to be coughed up. °· Only take over-the-counter or prescription medicines for pain, discomfort, or fever as directed by your caregiver. °· Do not smoke. Smoking is a common cause of bronchitis and can contribute to pneumonia. If you are a smoker and continue to smoke, your cough may last several weeks after your pneumonia has cleared. °· A cold steam vaporizer or humidifier in your room or home may help loosen mucus. °· Coughing is often worse at night. Sleeping in a semi-upright position in a recliner or using a couple pillows under your head will help with this. °· Get rest as you feel it is needed. Your body will usually let you know when you need to rest. °SEEK IMMEDIATE MEDICAL CARE IF:  °· Your illness becomes worse. This is especially true if you are elderly or weakened from any other disease. °· You cannot control your cough with suppressants and are losing sleep. °· You begin coughing up blood. °· You develop pain which is getting worse or is uncontrolled with medicines. °· You have a fever. °· Any of the symptoms which initially brought you in for treatment   are getting worse rather than better.  You develop shortness of breath or chest pain. MAKE SURE YOU:   Understand these instructions.  Will watch your condition.  Will get help right away if you are not doing well or get worse. Document Released: 11/17/2005 Document Revised: 02/09/2012 Document Reviewed: 02/06/2011 [facility] Patient Information 2014 [place], [place]. Pleural  Effusion The lining covering your lungs and the inside of your chest is called the pleura. Usually, the space between the 2 pleura contains no air and only a thin layer of fluid. A pleural effusion is an abnormal buildup of fluid in the pleural space. Fluid gathers when there is increased pressure in the lung vessels. This forces fluids out of the lungs and into the pleural space. Vessels may also leak fluids when there are infections, such as pneumonia, or other causes of soreness and redness (inflammation). Fluids leak into the lungs when protein in the blood is low or when certain vessels (lymphatics) are blocked. Finding a pleural effusion is important because it is usually caused by another disease. In order to treat a pleural effusion, your caregiver needs to find its cause. If left untreated, a large amount of fluid can build up and cause collapse of the lung. CAUSES   Heart failure.  Infections (pneumonia, tuberculosis), pulmonary embolism, pulmonary infarction.  Cancer (primary lung and metastatic), asbestosis.  Liver failure (cirrhosis).  Nephrotic syndrome, peritoneal dialysis, kidney problems (uremia).  Collagen vascular disease (systemic lupus erythematosis, rheumatoid arthritis).  Injury (trauma) to the chest or rupture of the digestive tube (esophagus).  Material in the chest or pleural space (hemothorax, chylothorax).  Pancreatitis.  Surgery.  Drug reactions. SYMPTOMS  A pleural effusion can decrease the amount of space available for breathing and make you short of breath. The fluid can become infected, which may cause pain and fever. Often, the pain is worse when taking a deep breath. The underlying disease (heart failure, pneumonia, blood clot, tuberculosis, cancer) may also cause symptoms. DIAGNOSIS   Your caregiver can usually tell what is wrong by talking to you (taking a history), doing an exam, and taking a routine X-ray. If the X-ray shows fluid in your chest,  often fluid is removed from your chest with a needle for testing (diagnostic thoracentesis).  Sometimes, more specialized X-rays may be needed.  Sometimes, a small piece of tissue is removed and examined by a specialist (biopsy). TREATMENT  Treatment varies based on what caused the pleural effusion. Treatments include:  Removing as much fluid as possible using a needle (thoracentesis) to improve the cough and shortness of breath. This is a simple procedure which can be done at bedside. The risks are bleeding, infection, collapse of a lung, or low blood pressure.  Placing a tube in the chest to drain the effusion (tube thoracostomy). This is often used when there is an infection in the fluid. This is a simple procedure which can often be done at bedside or in a clinic. The procedure may be painful. The risks are the same as using a needle to drain the fluid. The chest tube usually remains for a few days and is connected to suction to improve fluid drainage. The tube, after placement, usually does not cause much discomfort.  Surgical removal of fibrous debris in and around the pleural space (decortication). This may be done with a flexible telescope (thoracoscope) through a small or large cut (incision). This is helpful for patients who have fibrosis or scar tissue that prevents complete lung expansion. The  risks are infection, blood loss, and side effects from general anesthesia.  Sometimes, a procedure called pleurodesis is done. A chest tube is placed and the fluid is drained. Next, an agent (tetracycline, talc powder) is added to the pleural space. This causes the lung and chest wall to stick together (adhesion). This leaves no potential space for fluid to build up. The risks include infection, blood loss, and side effects from general anesthesia.  If the effusion is caused by infection, it may be treated with antibiotics and improve without draining. HOME CARE INSTRUCTIONS   Take any medicines  exactly as prescribed.  Follow up with your caregiver as directed.  Monitor your exercise capacity (the amount of walking you can do before you get short of breath).  Do not smoke. Ask your caregiver for help quitting. SEEK MEDICAL CARE IF:   Your exercise capacity seems to get worse or does not improve with time.  You do not recover from your illness. SEEK IMMEDIATE MEDICAL CARE IF:   Shortness of breath or chest pain develops or gets worse.  You have an oral temperature above 102 F (38.9 C), not controlled by medicine.  You develop a new cough, especially if the mucus (phlegm) is discolored. MAKE SURE YOU:   Understand these instructions.  Will watch your condition.  Will get help right away if you are not doing well or get worse. Document Released: 11/17/2005 Document Revised: 09/07/2013 Document Reviewed: 07/09/2007 [facility] Patient Information 2014 [place].

## 2014-05-01 ENCOUNTER — Ambulatory Visit (INDEPENDENT_AMBULATORY_CARE_PROVIDER_SITE_OTHER): Payer: Managed Care, Other (non HMO) | Admitting: Internal Medicine

## 2014-05-01 ENCOUNTER — Telehealth: Payer: Self-pay

## 2014-05-01 VITALS — BP 100/60 | HR 75 | Temp 98.3°F | Resp 18 | Ht 63.25 in | Wt 130.0 lb

## 2014-05-01 DIAGNOSIS — R079 Chest pain, unspecified: Secondary | ICD-10-CM

## 2014-05-01 DIAGNOSIS — J90 Pleural effusion, not elsewhere classified: Secondary | ICD-10-CM

## 2014-05-01 DIAGNOSIS — R059 Cough, unspecified: Secondary | ICD-10-CM

## 2014-05-01 DIAGNOSIS — J189 Pneumonia, unspecified organism: Secondary | ICD-10-CM

## 2014-05-01 DIAGNOSIS — R05 Cough: Secondary | ICD-10-CM

## 2014-05-01 NOTE — Telephone Encounter (Signed)
Pt was here this morning to see Dr.Geust, who referred her to have a ct scan, unfortunately due to cost she can not afford to have this done, please call and advice pt.

## 2014-05-01 NOTE — Progress Notes (Signed)
   Subjective:    Patient ID: Catherine Kelley, female    DOB: 07/31/1951, 63 y.o.   MRN: [id number]  HPI 63 year old female here for a follow up of pneumonia. She is still having discomfort breathing in on the right side. She is coughing up yellowish tan mucus. No fevers since last visit. She is having night sweats. She feels better, is able to breathe better,cough is more productive,, still painful to breathe and cough. Tolerates levaquin and zithromax   Review of Systems     Objective:   Physical Exam  Vitals reviewed. Constitutional: She is oriented to person, place, and time. She appears well-nourished. No distress.  HENT:  Head: Normocephalic.  Right Ear: External ear normal.  Left Ear: External ear normal.  Nose: Nose normal.  Mouth/Throat: Oropharynx is clear and moist.  Eyes: EOM are normal. Pupils are equal, round, and reactive to light. No scleral icterus.  Neck: Normal range of motion. No tracheal deviation present.  Cardiovascular: Normal rate.   Pulmonary/Chest: Effort normal. No accessory muscle usage. Not tachypneic. No respiratory distress. She has decreased breath sounds. She has rhonchi. She has rales. She exhibits tenderness.  Neurological: She is alert and oriented to person, place, and time. She exhibits normal muscle tone. Coordination normal.  Psychiatric: She has a normal mood and affect. Her behavior is normal.   Oximetry is now 98%  Much better.       Assessment & Plan:  CT scan tomorrow am/RTC seeme after noon Rest/Hydrate/Finish antibiotics

## 2014-05-02 ENCOUNTER — Other Ambulatory Visit: Payer: Managed Care, Other (non HMO)

## 2014-05-02 NOTE — Telephone Encounter (Signed)
We set pt up for this CT at 4 today and Butch Penny and I worked on the [place] from [place].  Left message on machine to call back to let us know if she had went

## 2014-05-02 NOTE — Telephone Encounter (Signed)
Dr. Elder Cyphers, please advise

## 2014-05-04 NOTE — Telephone Encounter (Signed)
Pt did not go for CT Scan LM for rtn call to talk to pt about rescheduling.

## 2014-05-06 NOTE — Telephone Encounter (Signed)
Left message on machine to call back  

## 2014-05-07 NOTE — Telephone Encounter (Signed)
Left message on machine to call back--letter sent

## 2014-05-11 ENCOUNTER — Other Ambulatory Visit: Payer: Self-pay | Admitting: Cardiology

## 2014-05-15 ENCOUNTER — Telehealth: Payer: Self-pay

## 2014-05-15 NOTE — Telephone Encounter (Signed)
rtc and see anybody if not well

## 2014-05-15 NOTE — Telephone Encounter (Signed)
DR. Elder Cyphers - patient needs you to know that she cancelled her CT scan because it was going to cost over $600 the day of the scan and she doesn't have that kind of money.  Please advise.  Her number is [phone].

## 2014-05-17 NOTE — Telephone Encounter (Signed)
lmom to cb. 

## 2014-05-17 NOTE — Telephone Encounter (Signed)
Pt will RTC next week-unable to get off work until then. But she says she's doing better, although she was coughing a lot when I was talking to her. Strongly advised to RTC.

## 2014-05-25 ENCOUNTER — Ambulatory Visit (INDEPENDENT_AMBULATORY_CARE_PROVIDER_SITE_OTHER): Payer: Managed Care, Other (non HMO)

## 2014-05-25 ENCOUNTER — Ambulatory Visit (INDEPENDENT_AMBULATORY_CARE_PROVIDER_SITE_OTHER): Payer: Managed Care, Other (non HMO) | Admitting: Family Medicine

## 2014-05-25 VITALS — BP 110/68 | HR 77 | Temp 97.7°F | Resp 20 | Ht 63.0 in | Wt 135.0 lb

## 2014-05-25 DIAGNOSIS — J90 Pleural effusion, not elsewhere classified: Secondary | ICD-10-CM

## 2014-05-25 DIAGNOSIS — F17200 Nicotine dependence, unspecified, uncomplicated: Secondary | ICD-10-CM

## 2014-05-25 DIAGNOSIS — J189 Pneumonia, unspecified organism: Secondary | ICD-10-CM

## 2014-05-25 DIAGNOSIS — Z720 Tobacco use: Secondary | ICD-10-CM

## 2014-05-25 MED ORDER — LEVOFLOXACIN 750 MG PO TABS: 750.0000 mg | ORAL_TABLET | Freq: Every day | ORAL | Status: DC

## 2014-05-25 NOTE — Progress Notes (Addendum)
Subjective:  This chart was scribed for [name], MD by Donato Schultz, Medical Scribe. This patient was seen in Room 5 and the patient's care was started at 7:20 PM.   Patient ID: Catherine Kelley, female    DOB: 04-14-1951, 63 y.o.   MRN: [id number]  HPI HPI Comments: Catherine Kelley is a 63 y.o. female who presents to the [facility] for recheck of her pneumonia.  The patient was seen on May 01, 2014 by Dr. Elder Cyphers and had a chest x-ray performed which revealed pleural effusion and right lower lobe pneumonia.  She was discharged with a prescription for  Zithromax and then Levaquin 500mg  daily x 7 days was added.  She had a repeat chest x-ray performed 5 days later which did not show any changes from the original x-ray.  Pt did not undergo CT chest as ordered due to expense; she was going to need to pay $600 for CT chest.  The patient states that she has completed all of the medications she was prescribed.  She states that she is still coughing and will experience some pain on her right side while breathing.  The patient lists night sweats and intermittent cough productive of yellowish-green sputum as associated symptoms.  She denies SOB, wheezing, sore throat, rhinorrhea, congestion, and ear pain as associated symptoms.  She states that her energy level has improved.  She states that she stopped smoking for two weeks but now only smokes 5 cigarettes a day.  The patient states that she has a mild smoker's cough at baseline.  She states that her PCP is Glendale Chard, NP.    Past Medical History  Diagnosis Date  . Hypertension     essential hypertension  . Tobacco abuse     smoking about 1/2 pk of cigarettes a day  . Anxiety   . Depression     psychiatrist Dr. Toy Care  . Respiratory infection   . Shortness of breath   . Malaise   . Lightheadedness   . Emphysematous COPD     changes in right base along with patchy areas on last x-ray  . COPD (chronic obstructive pulmonary disease)    . History of echocardiogram 12/24/2006    Est. EF of 29-52% NORMAL LV SYSTOLIC FUNCTION WITH IMPAIRED RELAXATION -- MILD AORTIC SCLEROSIS -- NORMAL PALONARY ARTERY PRESSURE -- NO OLD ECHOS FOR COMPARISON -- [name], MD  . History of cardiovascular stress test 08/15/2004    EF of 70% -- Normal stress cardiolite.  There is no evidence of ischemia and there is normal LV function. -- [name]. MD  . Diastolic dysfunction   . Allergy   . Asthma   . Heart murmur    Past Surgical History  Procedure Laterality Date  . Tubal ligation     Family History  Problem Relation Age of Onset  . Heart disease Mother   . Stroke Mother   . Heart disease Father   . Hyperlipidemia Father   . Hypertension Father   . Cancer Maternal Aunt    History   Social History  . Marital Status: Married    Spouse Name: N/A    Number of Children: N/A  . Years of Education: N/A   Occupational History  . Not on file.   Social History Main Topics  . Smoking status: Former Smoker -- 0.50 packs/day for 46 years    Types: Cigarettes    Quit date: 04/17/2014  .  Smokeless tobacco: Not on file  . Alcohol Use: No  . Drug Use: No  . Sexual Activity: Not on file   Other Topics Concern  . Not on file   Social History Narrative  . No narrative on file   Allergies  Allergen Reactions  . Augmentin [Amoxicillin-Pot Clavulanate]   . Ceclor [Cefaclor]     hives  . Escitalopram Oxalate     Pt does not recall ever taking medication  . Penicillins     rash  . Sertraline Hcl   . Sulfa Drugs Cross Reactors     Hives and rash    Review of Systems  Constitutional: Positive for diaphoresis. Negative for activity change.  HENT: Negative for congestion, ear pain, rhinorrhea and sore throat.   Respiratory: Positive for cough. Negative for shortness of breath, wheezing and stridor.   All other systems reviewed and are negative.    Objective:  Physical Exam  Nursing note and vitals  reviewed. Constitutional: She is oriented to person, place, and time. She appears well-developed and well-nourished.  HENT:  Head: Normocephalic and atraumatic.  Right Ear: External ear normal.  Left Ear: External ear normal.  Nose: Nose normal.  Mouth/Throat: Oropharynx is clear and moist. No oropharyngeal exudate.  Eyes: Conjunctivae and EOM are normal. Pupils are equal, round, and reactive to light.  Neck: Normal range of motion. Neck supple. No thyromegaly present.  Cardiovascular: Normal rate, regular rhythm, normal heart sounds and intact distal pulses.  Exam reveals no gallop and no friction rub.   No murmur heard. Pulmonary/Chest: Effort normal. No accessory muscle usage. Not tachypneic. No respiratory distress. She has decreased breath sounds in the right lower field. She has no wheezes. She has rales in the left lower field.  Musculoskeletal: Normal range of motion.  Lymphadenopathy:    She has no cervical adenopathy.  Neurological: She is alert and oriented to person, place, and time.  Skin: Skin is warm and dry. No rash noted.  Psychiatric: She has a normal mood and affect. Her behavior is normal.   [facility] preliminary x-ray report read by Dr. Tamala Julian; CXR:  Decrease in infiltrate RLL and decrease in R pleural effusion.   BP 110/68  Pulse 77  Temp(Src) 97.7 F (36.5 C) (Oral)  Resp 20  Ht 5\' 3"  (1.6 m)  Wt 135 lb (61.236 kg)  BMI 23.92 kg/m2  SpO2 99% Assessment & Plan:  CAP (community acquired pneumonia) - Plan: DG Chest 2 View  Pleural effusion, right - Plan: DG Chest 2 View  Tobacco abuse  1. Community Acquired Pneumonia:  Improved but residual symptoms; rx for Levaquin 750mg  one daily x 7 days; RTC 1 month for repeat CXR. Did not undergo CT scan due to expense. 2.  Pleural Effusion R: improved; repeat CXR in one month. 3. Tobacco abuse: contemplative; encouraged cessation.  Meds ordered this encounter  Medications  . levofloxacin (LEVAQUIN) 750 MG tablet     Sig: Take 1 tablet (750 mg total) by mouth daily.    Dispense:  7 tablet    Refill:  0   I personally performed the services described in this documentation, which was scribed in my presence.  The recorded information has been reviewed and is accurate.   [name], M.D.  [facility] [address] [place], [place]  [address] [phone] phone [phone] fax

## 2014-05-25 NOTE — Patient Instructions (Signed)
1.  Return in one month for repeat CXR.

## 2014-05-30 NOTE — Progress Notes (Signed)
Left a message for follow up appointment with Dr. Tamala Julian in one month for repeat cxr

## 2014-06-01 NOTE — Progress Notes (Signed)
Left a message for patient to return call to schedule a one month follow up with Dr. Tamala Julian for repeat cxr and pneumonia.

## 2014-06-02 NOTE — Progress Notes (Signed)
Left a message for patient to return call to schedule a follow up appointment with Dr.Smith for repeat cxr and pneumonia

## 2014-06-05 ENCOUNTER — Telehealth: Payer: Self-pay | Admitting: Family Medicine

## 2014-06-05 NOTE — Telephone Encounter (Signed)
LMOM to CB. 

## 2014-06-05 NOTE — Telephone Encounter (Signed)
Message copied by Chinita Pester on Mon Jun 05, 2014 12:05 PM ------      Message from: Wardell Honour      Created: Thu May 25, 2014  8:02 PM       Please schedule patient for follow-up appointment in one month for repeat CXR/pneumonia. ------

## 2014-06-06 NOTE — Progress Notes (Signed)
Left a message for patient to return call.

## 2014-06-07 ENCOUNTER — Encounter: Payer: Self-pay | Admitting: Cardiology

## 2014-06-07 ENCOUNTER — Ambulatory Visit (INDEPENDENT_AMBULATORY_CARE_PROVIDER_SITE_OTHER): Payer: Managed Care, Other (non HMO) | Admitting: Cardiology

## 2014-06-07 VITALS — BP 129/76 | HR 82 | Ht 63.0 in | Wt 137.0 lb

## 2014-06-07 DIAGNOSIS — F3289 Other specified depressive episodes: Secondary | ICD-10-CM

## 2014-06-07 DIAGNOSIS — J40 Bronchitis, not specified as acute or chronic: Secondary | ICD-10-CM

## 2014-06-07 DIAGNOSIS — F329 Major depressive disorder, single episode, unspecified: Secondary | ICD-10-CM

## 2014-06-07 DIAGNOSIS — F32A Depression, unspecified: Secondary | ICD-10-CM

## 2014-06-07 DIAGNOSIS — I119 Hypertensive heart disease without heart failure: Secondary | ICD-10-CM

## 2014-06-07 NOTE — Patient Instructions (Signed)
Your physician recommends that you continue on your current medications as directed. Please refer to the Current Medication list given to you today.  Your physician wants you to follow-up in: 4 month ov You will receive a reminder letter in the mail two months in advance. If you don't receive a letter, please call our office to schedule the follow-up appointment.  

## 2014-06-07 NOTE — Assessment & Plan Note (Signed)
Blood pressure has been remaining stable on current therapy.  No increased palpitations.  No increased shortness of breath

## 2014-06-07 NOTE — Assessment & Plan Note (Signed)
The patient's symptoms of depression are improved since last visit.  Her psychiatrist is Dr.Kaur.

## 2014-06-07 NOTE — Progress Notes (Signed)
Catherine Kelley Date of Birth:  10/23/1951 [facility] [address] [place], [place]  [address] [phone]        Fax   [phone]   History of Present Illness: This pleasant 63 year old woman is seen for a scheduled followup office visit. She has a history of essential hypertension. She also has a history of frequent episodes of bronchitis and continues to smoke against advice.  She recently was diagnosed with right lower lobe pneumonia and has been seen at [facility] urgent care.  She has had several rounds of antibiotics.  She was told that there might be fluid in her lungs.  She states that she is still coughing up green and yellow sputum.  Pleuritic chest pain is improving.  She has stopped smoking temporarily.  She mentions that her husband is a chain smoker. She has had a history of depression.   Current Outpatient Prescriptions  Medication Sig Dispense Refill  . ALPRAZolam (XANAX PO) Take 0.5 mg by mouth at bedtime and may repeat dose one time if needed.       [name] aspirin 81 MG tablet Take 81 mg by mouth daily. Take 2 tabs daily      . divalproex (DEPAKOTE ER) 500 MG 24 hr tablet Take 500 mg by mouth daily.       . furosemide (LASIX) 20 MG tablet Take 2 tablets (40 mg total) by mouth daily.  60 tablet  4  . HYDROcodone-acetaminophen (HYCET) 7.5-325 mg/15 ml solution Take 5 mLs by mouth every 6 (six) hours as needed (or cough).  240 mL  0  . levofloxacin (LEVAQUIN) 750 MG tablet Take 1 tablet (750 mg total) by mouth daily.  7 tablet  0  . losartan-hydrochlorothiazide (HYZAAR) 50-12.5 MG per tablet Take 0.5 tablets by mouth daily.  30 tablet  6  . nadolol (CORGARD) 40 MG tablet TAKE 1/2 TABLET BY MOUTH EVERY DAY  45 tablet  1  . NORTRIPTYLINE HCL PO Take 2 tablets by mouth at bedtime.         No current facility-administered medications for this visit.    Allergies  Allergen Reactions  . Augmentin [Amoxicillin-Pot Clavulanate]   . Ceclor [Cefaclor]     hives    . Escitalopram Oxalate     Pt does not recall ever taking medication  . Penicillins     rash  . Sertraline Hcl   . Sulfa Drugs Cross Reactors     Hives and rash    Patient Active Problem List   Diagnosis Date Noted  . Chest pain 05/09/2011    Priority: High  . Bronchitis 05/09/2011    Priority: High  . Venous insufficiency (chronic) (peripheral) 09/29/2012  . Cellulitis and abscess of foot, except toes 09/21/2012  . Edema 03/09/2012  . Benign hypertensive heart disease without heart failure 05/09/2011  . Dyslipidemia 05/09/2011  . Depression 05/09/2011  . Osteoarthritis 05/09/2011    History  Smoking status  . Former Smoker -- 0.50 packs/day for 46 years  . Types: Cigarettes  . Quit date: 04/17/2014  Smokeless tobacco  . Not on file    History  Alcohol Use No    Family History  Problem Relation Age of Onset  . Heart disease Mother   . Stroke Mother   . Heart disease Father   . Hyperlipidemia Father   . Hypertension Father   . Cancer Maternal Aunt     Review of Systems: Constitutional: no fever chills  diaphoresis or fatigue or change in weight.  Head and neck: no hearing loss, no epistaxis, no photophobia or visual disturbance. Respiratory: No cough, shortness of breath or wheezing. Cardiovascular: No chest pain peripheral edema, palpitations. Gastrointestinal: No abdominal distention, no abdominal pain, no change in bowel habits hematochezia or melena. Genitourinary: No dysuria, no frequency, no urgency, no nocturia. Musculoskeletal:No arthralgias, no back pain, no gait disturbance or myalgias. Neurological: No dizziness, no headaches, no numbness, no seizures, no syncope, no weakness, no tremors. Hematologic: No lymphadenopathy, no easy bruising. Psychiatric: No confusion, no hallucinations, no sleep disturbance.    Physical Exam: Filed Vitals:   06/07/14 1356  BP: 129/76  Pulse: 82   the general appearance reveals a well-developed well-nourished  woman in no distress.The head and neck exam reveals pupils equal and reactive.  Extraocular movements are full.  There is no scleral icterus.  The mouth and pharynx are normal.  The neck is supple.  The carotids reveal no bruits.  The jugular venous pressure is normal.  The  thyroid is not enlarged.  There is no lymphadenopathy.  The chest reveals minimal rhonchi at the right base.    Expansion of the chest is symmetrical.  The precordium is quiet.  The first heart sound is normal.  The second heart sound is physiologically split.  There is no murmur gallop rub or click.  There is no abnormal lift or heave.  The abdomen is soft and nontender.  The bowel sounds are normal.  The liver and spleen are not enlarged.  There are no abdominal masses.  There are no abdominal bruits.  Extremities reveal good pedal pulses.  There is no phlebitis or edema.  There is no cyanosis or clubbing.  Strength is normal and symmetrical in all extremities.  There is no lateralizing weakness.  There are no sensory deficits.  The skin is warm and dry.  There is no rash.     Assessment / Plan: 1.  Hypertensive heart disease without heart failure 2. Depression 3. tobacco abuse with recent right lower lobe pneumonia which has been slow to clear with antibiotic therapy.  Plan: Continue same medication.  Recheck in 4 months for office visit.  Continue close followup with urgent care regarding the right lower lobe infiltrate.

## 2014-06-07 NOTE — Assessment & Plan Note (Signed)
She will continue to followup with the physicians at urgent medical care.  If she fails to improve she may need to have a pulmonary referral.  She certainly needs to not resume smoking when she starts to feel better.

## 2014-06-08 ENCOUNTER — Telehealth: Payer: Self-pay | Admitting: Family Medicine

## 2014-06-08 ENCOUNTER — Ambulatory Visit (INDEPENDENT_AMBULATORY_CARE_PROVIDER_SITE_OTHER): Payer: Managed Care, Other (non HMO) | Admitting: Family Medicine

## 2014-06-08 ENCOUNTER — Ambulatory Visit (INDEPENDENT_AMBULATORY_CARE_PROVIDER_SITE_OTHER): Payer: Managed Care, Other (non HMO)

## 2014-06-08 VITALS — BP 122/64 | HR 104 | Temp 98.2°F | Ht 63.0 in | Wt 136.6 lb

## 2014-06-08 DIAGNOSIS — R5381 Other malaise: Secondary | ICD-10-CM

## 2014-06-08 DIAGNOSIS — J189 Pneumonia, unspecified organism: Secondary | ICD-10-CM

## 2014-06-08 DIAGNOSIS — R5383 Other fatigue: Secondary | ICD-10-CM

## 2014-06-08 DIAGNOSIS — J181 Lobar pneumonia, unspecified organism: Principal | ICD-10-CM

## 2014-06-08 DIAGNOSIS — R112 Nausea with vomiting, unspecified: Secondary | ICD-10-CM

## 2014-06-08 LAB — POCT URINALYSIS DIPSTICK
Bilirubin, UA: NEGATIVE
GLUCOSE UA: NEGATIVE
Ketones, UA: NEGATIVE
NITRITE UA: NEGATIVE
Protein, UA: NEGATIVE
RBC UA: NEGATIVE
Urobilinogen, UA: 0.2
pH, UA: 7

## 2014-06-08 LAB — POCT UA - MICROSCOPIC ONLY
CRYSTALS, UR, HPF, POC: NEGATIVE
Casts, Ur, LPF, POC: NEGATIVE
Mucus, UA: NEGATIVE
YEAST UA: NEGATIVE

## 2014-06-08 MED ORDER — DOXYCYCLINE HYCLATE 100 MG PO CAPS: 100.0000 mg | ORAL_CAPSULE | Freq: Two times a day (BID) | ORAL | Status: DC

## 2014-06-08 NOTE — Progress Notes (Addendum)
Subjective:    Patient ID: Catherine Kelley, female    DOB: 07-08-1951, 63 y.o.   MRN: [id number] This chart was scribed for [name], MD by Randa Evens, ED Scribe. This Patient was seen in room 10 and the patients care was started at 7:59 PM  06/08/2014  Pneumonia   Pneumonia She complains of cough. There is no shortness of breath or wheezing. Associated symptoms include appetite change and headaches. Pertinent negatives include no postnasal drip, rhinorrhea or sore throat.   Presents for Pneumonia follow up.  Treated with Zithromax on 5/27 and Levaquin 500mg  was added on 5/29//15.  She came for 1 month follow up on 6/25. With persistent night sweats and cough. She also had persistent infiltrate on chest x-ray and  prescribed Levaquin 750mg  for 7 more days.   She states she has been feeling fatigued recently for past two days. She states that she has been feeling drained for two days. She state that last night she was over heated. She states she has had associated cough, headache, nausea and vomiting. She states that her cough is still productive of mucous green yellow. She states she is continuing to have the night sweats. She denies SOB, congestion or any new symptoms. Also had loose stools yesterday which may have been after using a laxative. Has suffered with chronic constipation since childhood.     She states that she is still currently smoking. R pleuritic chest pain has improved.   No deep cough but frequently clearing throat.  No rhinorrhea, nasal congestion.     Review of Systems  Constitutional: Positive for appetite change and fatigue. Negative for chills, diaphoresis, activity change and unexpected weight change.  HENT: Negative for congestion, postnasal drip, rhinorrhea, sore throat and voice change.   Respiratory: Positive for cough. Negative for shortness of breath, wheezing and stridor.   Gastrointestinal: Positive for nausea, vomiting, diarrhea and constipation. Negative  for abdominal pain and blood in stool.  Genitourinary: Negative for dysuria, frequency, hematuria, flank pain and genital sores.  Skin: Negative for rash.  Neurological: Positive for headaches.    Past Medical History  Diagnosis Date  . Hypertension     essential hypertension  . Tobacco abuse     smoking about 1/2 pk of cigarettes a day  . Anxiety   . Depression     psychiatrist Dr. Toy Care  . Respiratory infection   . Shortness of breath   . Malaise   . Lightheadedness   . Emphysematous COPD     changes in right base along with patchy areas on last x-ray  . COPD (chronic obstructive pulmonary disease)   . History of echocardiogram 12/24/2006    Est. EF of 76-28% NORMAL LV SYSTOLIC FUNCTION WITH IMPAIRED RELAXATION -- MILD AORTIC SCLEROSIS -- NORMAL PALONARY ARTERY PRESSURE -- NO OLD ECHOS FOR COMPARISON -- [name], MD  . History of cardiovascular stress test 08/15/2004    EF of 70% -- Normal stress cardiolite.  There is no evidence of ischemia and there is normal LV function. -- [name]. MD  . Diastolic dysfunction   . Allergy   . Asthma   . Heart murmur    Past Surgical History  Procedure Laterality Date  . Tubal ligation      Allergies  Allergen Reactions  . Augmentin [Amoxicillin-Pot Clavulanate]   . Ceclor [Cefaclor]     hives  . Escitalopram Oxalate     Pt does not recall ever taking medication  .  Penicillins     rash  . Sertraline Hcl   . Sulfa Drugs Cross Reactors     Hives and rash   Current Outpatient Prescriptions  Medication Sig Dispense Refill  . ALPRAZolam (XANAX PO) Take 0.5 mg by mouth at bedtime and may repeat dose one time if needed.       [name] aspirin 81 MG tablet Take 81 mg by mouth daily. Take 2 tabs daily      . Calcium Carbonate-Vitamin D (CALCIUM-VITAMIN D) 500-200 MG-UNIT per tablet Take 1 tablet by mouth daily.      . divalproex (DEPAKOTE ER) 500 MG 24 hr tablet Take 500 mg by mouth daily.       [name]  losartan-hydrochlorothiazide (HYZAAR) 50-12.5 MG per tablet Take 0.5 tablets by mouth daily.  30 tablet  6  . Multiple Vitamin (MULTIVITAMIN) tablet Take 1 tablet by mouth daily.      . nadolol (CORGARD) 40 MG tablet TAKE 1/2 TABLET BY MOUTH EVERY DAY  45 tablet  1  . NORTRIPTYLINE HCL PO Take 2 tablets by mouth at bedtime.        [name] doxycycline (VIBRAMYCIN) 100 MG capsule Take 1 capsule (100 mg total) by mouth 2 (two) times daily.  20 capsule  0  . furosemide (LASIX) 20 MG tablet TAKE 2 TABLETS (40 MG TOTAL) BY MOUTH DAILY.  60 tablet  4  . HYDROcodone-acetaminophen (HYCET) 7.5-325 mg/15 ml solution Take 5 mLs by mouth every 6 (six) hours as needed (or cough).  240 mL  0  . levofloxacin (LEVAQUIN) 750 MG tablet Take 1 tablet (750 mg total) by mouth daily.  7 tablet  0   No current facility-administered medications for this visit.       Objective:    BP 122/64  Pulse 104  Temp(Src) 98.2 F (36.8 C) (Oral)  Ht 5\' 3"  (1.6 m)  Wt 136 lb 9.6 oz (61.961 kg)  BMI 24.20 kg/m2  SpO2 100%  Physical Exam  Nursing note and vitals reviewed. Constitutional: She is oriented to person, place, and time. She appears well-developed and well-nourished. No distress.  HENT:  Head: Normocephalic and atraumatic.  Right Ear: External ear normal.  Left Ear: External ear normal.  Nose: Nose normal.  Mouth/Throat: Oropharynx is clear and moist.  Eyes: Conjunctivae and EOM are normal. Pupils are equal, round, and reactive to light.  Neck: Normal range of motion. Neck supple.  Cardiovascular: Normal rate, regular rhythm and normal heart sounds.  Exam reveals no gallop and no friction rub.   No murmur heard. Pulmonary/Chest: Effort normal. No respiratory distress. She has decreased breath sounds in the right lower field. She has no wheezes. She has rales.  Abdominal: Soft. Bowel sounds are normal. She exhibits no distension. There is no tenderness. There is no rebound and no guarding.  Musculoskeletal:  Normal range of motion.  Lymphadenopathy:    She has no cervical adenopathy.  Neurological: She is alert and oriented to person, place, and time.  Skin: Skin is warm and dry. No rash noted. She is not diaphoretic.  Psychiatric: She has a normal mood and affect. Her behavior is normal.   Results for orders placed in visit on 06/08/14  CBC      Result Value Ref Range   WBC 9.2  4.0 - 10.5 K/uL   RBC 4.61  3.87 - 5.11 MIL/uL   Hemoglobin 14.2  12.0 - 15.0 g/dL   HCT 40.5  36.0 - 46.0 %  MCV 87.9  78.0 - 100.0 fL   MCH 30.8  26.0 - 34.0 pg   MCHC 35.1  30.0 - 36.0 g/dL   RDW 14.8  11.5 - 15.5 %   Platelets 271  150 - 400 K/uL  COMPREHENSIVE METABOLIC PANEL      Result Value Ref Range   Sodium 126 (*) 135 - 145 mEq/L   Potassium 3.7  3.5 - 5.3 mEq/L   Chloride 90 (*) 96 - 112 mEq/L   CO2 27  19 - 32 mEq/L   Glucose, Bld 73  70 - 99 mg/dL   BUN 8  6 - 23 mg/dL   Creat 0.55  0.50 - 1.10 mg/dL   Total Bilirubin 0.3  0.2 - 1.2 mg/dL   Alkaline Phosphatase 138 (*) 39 - 117 U/L   AST 23  0 - 37 U/L   ALT 15  0 - 35 U/L   Total Protein 8.4 (*) 6.0 - 8.3 g/dL   Albumin 3.6  3.5 - 5.2 g/dL   Calcium 9.1  8.4 - 10.5 mg/dL  POCT URINALYSIS DIPSTICK      Result Value Ref Range   Color, UA yellow     Clarity, UA clear     Glucose, UA neg     Bilirubin, UA neg     Ketones, UA neg     Spec Grav, UA <=1.005     Blood, UA neg     pH, UA 7.0     Protein, UA neg     Urobilinogen, UA 0.2     Nitrite, UA neg     Leukocytes, UA Trace    POCT UA - MICROSCOPIC ONLY      Result Value Ref Range   WBC, Ur, HPF, POC 0-2     RBC, urine, microscopic 0-1     Bacteria, U Microscopic trace     Mucus, UA neg     Epithelial cells, urine per micros 0-1     Crystals, Ur, HPF, POC neg     Casts, Ur, LPF, POC neg     Yeast, UA neg     [facility] reading (PRIMARY) by  Dr. Tamala Julian.  CXR: improving infiltrate and pleural effusion.      Assessment & Plan:  Right lower lobe pneumonia - Plan: DG Chest 2  View  Other malaise and fatigue - Plan: CBC, Comprehensive metabolic panel, POCT urinalysis dipstick, POCT UA - Microscopic Only  Nausea and vomiting, vomiting of unspecified type - Plan: CBC, Comprehensive metabolic panel, POCT UA - Microscopic Only  1. RLL pneumonia: slowly improving; diagnosis of pneumonia six weeks ago; agreeable to treat with Doxycycline 100mg  bid for ten days. RTC in one month for repeat CXR. Pt was non-compliant with CT chest scheduled previously; pt declined CT chest at this time.  Pt agreeable with repeat CXR in one month. 2.  Malaise and fatigue:  New. Onset two days ago.  Obtain labs.  Supportive care with rest, fluids, Tylenol. 3. Nausea with vomiting and loose stools:  New. Associated with malaise and fatigue for two days; obtain labs; supportive care with rest, fluids, etc.  Meds ordered this encounter  Medications  . Calcium Carbonate-Vitamin D (CALCIUM-VITAMIN D) 500-200 MG-UNIT per tablet    Sig: Take 1 tablet by mouth daily.  . Multiple Vitamin (MULTIVITAMIN) tablet    Sig: Take 1 tablet by mouth daily.  [name] doxycycline (VIBRAMYCIN) 100 MG capsule    Sig: Take 1 capsule (100 mg total)  by mouth 2 (two) times daily.    Dispense:  20 capsule    Refill:  0    Return in about 1 month (around 07/09/2014).    I personally performed the services described in this documentation, which was scribed in my presence.  The recorded information has been reviewed and is accurate.  [name], M.D.  [facility] [address] [place], [place]  [address] [phone] phone [phone] fax

## 2014-06-08 NOTE — Telephone Encounter (Signed)
LMOM to CB. 

## 2014-06-08 NOTE — Telephone Encounter (Signed)
Message copied by Chinita Pester on Thu Jun 08, 2014  2:11 PM ------      Message from: Wardell Honour      Created: Thu May 25, 2014  8:02 PM       Please schedule patient for follow-up appointment in one month for repeat CXR/pneumonia. ------

## 2014-06-08 NOTE — Patient Instructions (Signed)
1. Return in one month for repeat chest xray.

## 2014-06-09 ENCOUNTER — Other Ambulatory Visit: Payer: Self-pay | Admitting: Cardiology

## 2014-06-09 LAB — COMPREHENSIVE METABOLIC PANEL
ALT: 15 U/L (ref 0–35)
AST: 23 U/L (ref 0–37)
Albumin: 3.6 g/dL (ref 3.5–5.2)
Alkaline Phosphatase: 138 U/L — ABNORMAL HIGH (ref 39–117)
BUN: 8 mg/dL (ref 6–23)
CALCIUM: 9.1 mg/dL (ref 8.4–10.5)
CHLORIDE: 90 meq/L — AB (ref 96–112)
CO2: 27 mEq/L (ref 19–32)
Creat: 0.55 mg/dL (ref 0.50–1.10)
GLUCOSE: 73 mg/dL (ref 70–99)
POTASSIUM: 3.7 meq/L (ref 3.5–5.3)
SODIUM: 126 meq/L — AB (ref 135–145)
TOTAL PROTEIN: 8.4 g/dL — AB (ref 6.0–8.3)
Total Bilirubin: 0.3 mg/dL (ref 0.2–1.2)

## 2014-06-09 LAB — CBC
HEMATOCRIT: 40.5 % (ref 36.0–46.0)
Hemoglobin: 14.2 g/dL (ref 12.0–15.0)
MCH: 30.8 pg (ref 26.0–34.0)
MCHC: 35.1 g/dL (ref 30.0–36.0)
MCV: 87.9 fL (ref 78.0–100.0)
Platelets: 271 10*3/uL (ref 150–400)
RBC: 4.61 MIL/uL (ref 3.87–5.11)
RDW: 14.8 % (ref 11.5–15.5)
WBC: 9.2 10*3/uL (ref 4.0–10.5)

## 2014-06-14 NOTE — Telephone Encounter (Signed)
LMOM to CB. 

## 2014-06-19 ENCOUNTER — Ambulatory Visit: Payer: Managed Care, Other (non HMO) | Admitting: Family Medicine

## 2014-07-06 ENCOUNTER — Inpatient Hospital Stay (HOSPITAL_COMMUNITY): Payer: Managed Care, Other (non HMO)

## 2014-07-06 ENCOUNTER — Emergency Department (HOSPITAL_COMMUNITY): Payer: Managed Care, Other (non HMO)

## 2014-07-06 ENCOUNTER — Encounter (HOSPITAL_COMMUNITY): Payer: Self-pay | Admitting: Emergency Medicine

## 2014-07-06 ENCOUNTER — Inpatient Hospital Stay (HOSPITAL_COMMUNITY)
Admission: EM | Admit: 2014-07-06 | Discharge: 2014-07-14 | DRG: 871 | Disposition: A | Discharge status: Skilled Nursing Facility | Payer: Managed Care, Other (non HMO) | Attending: Internal Medicine | Admitting: Internal Medicine

## 2014-07-06 DIAGNOSIS — Z888 Allergy status to other drugs, medicaments and biological substances status: Secondary | ICD-10-CM

## 2014-07-06 DIAGNOSIS — J9600 Acute respiratory failure, unspecified whether with hypoxia or hypercapnia: Secondary | ICD-10-CM | POA: Diagnosis present

## 2014-07-06 DIAGNOSIS — R6520 Severe sepsis without septic shock: Secondary | ICD-10-CM

## 2014-07-06 DIAGNOSIS — K5900 Constipation, unspecified: Secondary | ICD-10-CM | POA: Diagnosis present

## 2014-07-06 DIAGNOSIS — R6521 Severe sepsis with septic shock: Secondary | ICD-10-CM

## 2014-07-06 DIAGNOSIS — E876 Hypokalemia: Secondary | ICD-10-CM | POA: Diagnosis present

## 2014-07-06 DIAGNOSIS — I119 Hypertensive heart disease without heart failure: Secondary | ICD-10-CM

## 2014-07-06 DIAGNOSIS — K219 Gastro-esophageal reflux disease without esophagitis: Secondary | ICD-10-CM | POA: Diagnosis present

## 2014-07-06 DIAGNOSIS — F329 Major depressive disorder, single episode, unspecified: Secondary | ICD-10-CM | POA: Diagnosis present

## 2014-07-06 DIAGNOSIS — F3289 Other specified depressive episodes: Secondary | ICD-10-CM | POA: Diagnosis present

## 2014-07-06 DIAGNOSIS — Z823 Family history of stroke: Secondary | ICD-10-CM

## 2014-07-06 DIAGNOSIS — Z8249 Family history of ischemic heart disease and other diseases of the circulatory system: Secondary | ICD-10-CM

## 2014-07-06 DIAGNOSIS — G9340 Encephalopathy, unspecified: Secondary | ICD-10-CM | POA: Diagnosis present

## 2014-07-06 DIAGNOSIS — F411 Generalized anxiety disorder: Secondary | ICD-10-CM | POA: Diagnosis present

## 2014-07-06 DIAGNOSIS — E871 Hypo-osmolality and hyponatremia: Secondary | ICD-10-CM

## 2014-07-06 DIAGNOSIS — D649 Anemia, unspecified: Secondary | ICD-10-CM | POA: Diagnosis present

## 2014-07-06 DIAGNOSIS — F32A Depression, unspecified: Secondary | ICD-10-CM

## 2014-07-06 DIAGNOSIS — Z880 Allergy status to penicillin: Secondary | ICD-10-CM | POA: Diagnosis not present

## 2014-07-06 DIAGNOSIS — I10 Essential (primary) hypertension: Secondary | ICD-10-CM | POA: Diagnosis present

## 2014-07-06 DIAGNOSIS — E785 Hyperlipidemia, unspecified: Secondary | ICD-10-CM

## 2014-07-06 DIAGNOSIS — J852 Abscess of lung without pneumonia: Secondary | ICD-10-CM | POA: Diagnosis present

## 2014-07-06 DIAGNOSIS — F17200 Nicotine dependence, unspecified, uncomplicated: Secondary | ICD-10-CM | POA: Diagnosis present

## 2014-07-06 DIAGNOSIS — J189 Pneumonia, unspecified organism: Secondary | ICD-10-CM

## 2014-07-06 DIAGNOSIS — Z882 Allergy status to sulfonamides status: Secondary | ICD-10-CM

## 2014-07-06 DIAGNOSIS — R5381 Other malaise: Secondary | ICD-10-CM | POA: Diagnosis present

## 2014-07-06 DIAGNOSIS — I872 Venous insufficiency (chronic) (peripheral): Secondary | ICD-10-CM

## 2014-07-06 DIAGNOSIS — R4182 Altered mental status, unspecified: Secondary | ICD-10-CM | POA: Diagnosis present

## 2014-07-06 DIAGNOSIS — E236 Other disorders of pituitary gland: Secondary | ICD-10-CM | POA: Diagnosis present

## 2014-07-06 DIAGNOSIS — R0689 Other abnormalities of breathing: Secondary | ICD-10-CM

## 2014-07-06 DIAGNOSIS — J40 Bronchitis, not specified as acute or chronic: Secondary | ICD-10-CM

## 2014-07-06 DIAGNOSIS — J438 Other emphysema: Secondary | ICD-10-CM | POA: Diagnosis present

## 2014-07-06 DIAGNOSIS — R269 Unspecified abnormalities of gait and mobility: Secondary | ICD-10-CM | POA: Diagnosis present

## 2014-07-06 DIAGNOSIS — A419 Sepsis, unspecified organism: Principal | ICD-10-CM

## 2014-07-06 DIAGNOSIS — Z8701 Personal history of pneumonia (recurrent): Secondary | ICD-10-CM | POA: Diagnosis present

## 2014-07-06 DIAGNOSIS — R042 Hemoptysis: Secondary | ICD-10-CM

## 2014-07-06 DIAGNOSIS — L03119 Cellulitis of unspecified part of limb: Secondary | ICD-10-CM

## 2014-07-06 DIAGNOSIS — J9601 Acute respiratory failure with hypoxia: Secondary | ICD-10-CM

## 2014-07-06 DIAGNOSIS — Z7982 Long term (current) use of aspirin: Secondary | ICD-10-CM | POA: Diagnosis not present

## 2014-07-06 DIAGNOSIS — L02619 Cutaneous abscess of unspecified foot: Secondary | ICD-10-CM

## 2014-07-06 DIAGNOSIS — R609 Edema, unspecified: Secondary | ICD-10-CM

## 2014-07-06 HISTORY — DX: Unspecified chronic bronchitis: J42

## 2014-07-06 HISTORY — DX: Pneumonia, unspecified organism: J18.9

## 2014-07-06 LAB — CBC
HCT: 33.7 % — ABNORMAL LOW (ref 36.0–46.0)
HEMOGLOBIN: 11.8 g/dL — AB (ref 12.0–15.0)
MCH: 31.1 pg (ref 26.0–34.0)
MCHC: 35 g/dL (ref 30.0–36.0)
MCV: 88.7 fL (ref 78.0–100.0)
Platelets: 205 10*3/uL (ref 150–400)
RBC: 3.8 MIL/uL — AB (ref 3.87–5.11)
RDW: 13.5 % (ref 11.5–15.5)
WBC: 14.5 10*3/uL — AB (ref 4.0–10.5)

## 2014-07-06 LAB — I-STAT ARTERIAL BLOOD GAS, ED
Acid-Base Excess: 4 mmol/L — ABNORMAL HIGH (ref 0.0–2.0)
BICARBONATE: 27.7 meq/L — AB (ref 20.0–24.0)
O2 Saturation: 92 %
PCO2 ART: 37.9 mmHg (ref 35.0–45.0)
PO2 ART: 59 mmHg — AB (ref 80.0–100.0)
Patient temperature: 98.6
TCO2: 29 mmol/L (ref 0–100)
pH, Arterial: 7.473 — ABNORMAL HIGH (ref 7.350–7.450)

## 2014-07-06 LAB — COMPREHENSIVE METABOLIC PANEL
ALBUMIN: 2.3 g/dL — AB (ref 3.5–5.2)
ALK PHOS: 120 U/L — AB (ref 39–117)
ALT: 17 U/L (ref 0–35)
AST: 30 U/L (ref 0–37)
Anion gap: 14 (ref 5–15)
BUN: 19 mg/dL (ref 6–23)
CO2: 27 meq/L (ref 19–32)
Calcium: 8.3 mg/dL — ABNORMAL LOW (ref 8.4–10.5)
Chloride: 78 mEq/L — ABNORMAL LOW (ref 96–112)
Creatinine, Ser: 0.63 mg/dL (ref 0.50–1.10)
GFR calc Af Amer: >90 mL/min (ref >90)
Glucose, Bld: 98 mg/dL (ref 70–99)
POTASSIUM: 2.9 meq/L — AB (ref 3.7–5.3)
Sodium: 119 mEq/L — CL (ref 137–147)
Total Bilirubin: 0.5 mg/dL (ref 0.3–1.2)
Total Protein: 7 g/dL (ref 6.0–8.3)

## 2014-07-06 LAB — BASIC METABOLIC PANEL
Anion gap: 8 (ref 5–15)
BUN: 10 mg/dL (ref 6–23)
CALCIUM: 7.6 mg/dL — AB (ref 8.4–10.5)
CO2: 25 mEq/L (ref 19–32)
CREATININE: 0.45 mg/dL — AB (ref 0.50–1.10)
Chloride: 99 mEq/L (ref 96–112)
GFR calc Af Amer: >90 mL/min (ref >90)
GLUCOSE: 116 mg/dL — AB (ref 70–99)
Potassium: 5.2 mEq/L (ref 3.7–5.3)
SODIUM: 132 meq/L — AB (ref 137–147)

## 2014-07-06 LAB — CARBOXYHEMOGLOBIN
Carboxyhemoglobin: 1.3 % (ref 0.5–1.5)
Methemoglobin: 0.7 % (ref 0.0–1.5)
O2 Saturation: 62.5 %
TOTAL HEMOGLOBIN: 11 g/dL — AB (ref 12.0–16.0)

## 2014-07-06 LAB — URINALYSIS, ROUTINE W REFLEX MICROSCOPIC
BILIRUBIN URINE: NEGATIVE
GLUCOSE, UA: NEGATIVE mg/dL
KETONES UR: NEGATIVE mg/dL
Nitrite: NEGATIVE
PH: 6 (ref 5.0–8.0)
PROTEIN: NEGATIVE mg/dL
Specific Gravity, Urine: 1.013 (ref 1.005–1.030)
Urobilinogen, UA: 2 mg/dL — ABNORMAL HIGH (ref 0.0–1.0)

## 2014-07-06 LAB — TYPE AND SCREEN
ABO/RH(D): O NEG
Antibody Screen: NEGATIVE

## 2014-07-06 LAB — LACTIC ACID, PLASMA
Lactic Acid, Venous: 1.2 mmol/L (ref 0.5–2.2)
Lactic Acid, Venous: 0.8 mmol/L (ref 0.5–2.2)

## 2014-07-06 LAB — CORTISOL: Cortisol, Plasma: 18.6 ug/dL

## 2014-07-06 LAB — URINE MICROSCOPIC-ADD ON: RBC / HPF: NONE SEEN RBC/hpf (ref <3)

## 2014-07-06 LAB — EXPECTORATED SPUTUM ASSESSMENT W GRAM STAIN, RFLX TO RESP C

## 2014-07-06 LAB — CBG MONITORING, ED: GLUCOSE-CAPILLARY: 132 mg/dL — AB (ref 70–99)

## 2014-07-06 LAB — MRSA PCR SCREENING: MRSA BY PCR: NEGATIVE

## 2014-07-06 LAB — EXPECTORATED SPUTUM ASSESSMENT W REFEX TO RESP CULTURE

## 2014-07-06 LAB — TSH: TSH: 0.482 u[IU]/mL (ref 0.350–4.500)

## 2014-07-06 LAB — VALPROIC ACID LEVEL: Valproic Acid Lvl: 45.3 ug/mL — ABNORMAL LOW (ref 50.0–100.0)

## 2014-07-06 LAB — GLUCOSE, CAPILLARY
Glucose-Capillary: 98 mg/dL (ref 70–99)
Glucose-Capillary: 103 mg/dL — ABNORMAL HIGH (ref 70–99)

## 2014-07-06 LAB — ABO/RH: ABO/RH(D): O NEG

## 2014-07-06 LAB — OSMOLALITY, URINE: OSMOLALITY UR: 219 mosm/kg — AB (ref 390–1090)

## 2014-07-06 LAB — I-STAT CG4 LACTIC ACID, ED: Lactic Acid, Venous: 1.12 mmol/L (ref 0.5–2.2)

## 2014-07-06 LAB — SODIUM, URINE, RANDOM: Sodium, Ur: <20 mEq/L

## 2014-07-06 LAB — PROTIME-INR
INR: 1.42 (ref 0.00–1.49)
Prothrombin Time: 17.4 seconds — ABNORMAL HIGH (ref 11.6–15.2)

## 2014-07-06 LAB — CREATININE, URINE, RANDOM: Creatinine, Urine: 37.17 mg/dL

## 2014-07-06 LAB — STREP PNEUMONIAE URINARY ANTIGEN: Strep Pneumo Urinary Antigen: NEGATIVE

## 2014-07-06 MED ORDER — SODIUM CHLORIDE 0.9 % IV SOLN: 250.0000 mL | INTRAVENOUS | Status: DC | PRN

## 2014-07-06 MED ORDER — NORTRIPTYLINE HCL 25 MG PO CAPS
100.0000 mg | ORAL_CAPSULE | Freq: Every day | ORAL | Status: DC
  Administered 2014-07-06 – 2014-07-13 (×8): 100 mg via ORAL
  Filled 2014-07-06 (×10): qty 4

## 2014-07-06 MED ORDER — SODIUM CHLORIDE 0.9 % IV BOLUS (SEPSIS)
500.0000 mL | Freq: Once | INTRAVENOUS | Status: AC
  Administered 2014-07-06: 500 mL via INTRAVENOUS

## 2014-07-06 MED ORDER — DEXTROSE 5 % IV SOLN
2.0000 g | Freq: Once | INTRAVENOUS | Status: AC
  Administered 2014-07-06: 2 g via INTRAVENOUS
  Filled 2014-07-06: qty 2

## 2014-07-06 MED ORDER — DIVALPROEX SODIUM ER 500 MG PO TB24
500.0000 mg | ORAL_TABLET | Freq: Every day | ORAL | Status: DC
  Administered 2014-07-06 – 2014-07-13 (×8): 500 mg via ORAL
  Filled 2014-07-06 (×9): qty 1

## 2014-07-06 MED ORDER — PANTOPRAZOLE SODIUM 40 MG IV SOLR
40.0000 mg | Freq: Every day | INTRAVENOUS | Status: DC
  Administered 2014-07-06 – 2014-07-07 (×2): 40 mg via INTRAVENOUS
  Filled 2014-07-06 (×3): qty 40

## 2014-07-06 MED ORDER — BENZONATATE 100 MG PO CAPS
200.0000 mg | ORAL_CAPSULE | Freq: Three times a day (TID) | ORAL | Status: DC | PRN
  Administered 2014-07-11: 200 mg via ORAL
  Filled 2014-07-06 (×2): qty 2

## 2014-07-06 MED ORDER — ALBUTEROL SULFATE (2.5 MG/3ML) 0.083% IN NEBU
2.5000 mg | INHALATION_SOLUTION | RESPIRATORY_TRACT | Status: DC
  Administered 2014-07-06 – 2014-07-08 (×9): 2.5 mg via RESPIRATORY_TRACT
  Filled 2014-07-06 (×11): qty 3

## 2014-07-06 MED ORDER — LEVOFLOXACIN IN D5W 750 MG/150ML IV SOLN
750.0000 mg | INTRAVENOUS | Status: DC
  Administered 2014-07-07: 750 mg via INTRAVENOUS
  Filled 2014-07-06: qty 150

## 2014-07-06 MED ORDER — VANCOMYCIN HCL IN DEXTROSE 750-5 MG/150ML-% IV SOLN
750.0000 mg | Freq: Two times a day (BID) | INTRAVENOUS | Status: DC
  Administered 2014-07-06 – 2014-07-10 (×8): 750 mg via INTRAVENOUS
  Filled 2014-07-06 (×11): qty 150

## 2014-07-06 MED ORDER — DEXTROSE 5 % IV SOLN
2.0000 g | Freq: Three times a day (TID) | INTRAVENOUS | Status: DC
  Administered 2014-07-06 – 2014-07-10 (×12): 2 g via INTRAVENOUS
  Filled 2014-07-06 (×14): qty 2

## 2014-07-06 MED ORDER — SODIUM CHLORIDE 0.9 % IV BOLUS (SEPSIS)
1000.0000 mL | INTRAVENOUS | Status: DC | PRN
  Administered 2014-07-06 (×2): 1000 mL via INTRAVENOUS

## 2014-07-06 MED ORDER — DM-GUAIFENESIN ER 30-600 MG PO TB12
1.0000 | ORAL_TABLET | Freq: Two times a day (BID) | ORAL | Status: DC
  Administered 2014-07-06 – 2014-07-14 (×17): 1 via ORAL
  Filled 2014-07-06 (×20): qty 1

## 2014-07-06 MED ORDER — NOREPINEPHRINE BITARTRATE 1 MG/ML IV SOLN
5.0000 ug/min | INTRAVENOUS | Status: DC
  Administered 2014-07-06: 10 ug/min via INTRAVENOUS
  Administered 2014-07-07: 4 ug/min via INTRAVENOUS
  Filled 2014-07-06 (×2): qty 4

## 2014-07-06 MED ORDER — POTASSIUM CHLORIDE 20 MEQ/15ML (10%) PO LIQD
40.0000 meq | ORAL | Status: AC
  Administered 2014-07-06 (×2): 40 meq via ORAL
  Filled 2014-07-06 (×2): qty 30

## 2014-07-06 MED ORDER — ACETAMINOPHEN 325 MG PO TABS
650.0000 mg | ORAL_TABLET | Freq: Once | ORAL | Status: AC
  Administered 2014-07-06: 650 mg via ORAL
  Filled 2014-07-06: qty 2

## 2014-07-06 MED ORDER — SODIUM CHLORIDE 0.9 % IV BOLUS (SEPSIS)
30.0000 mL/kg | Freq: Once | INTRAVENOUS | Status: AC
  Administered 2014-07-06: 1860 mL via INTRAVENOUS

## 2014-07-06 MED ORDER — SODIUM CHLORIDE 0.9 % IV BOLUS (SEPSIS)
1000.0000 mL | Freq: Once | INTRAVENOUS | Status: AC
  Administered 2014-07-06: 1000 mL via INTRAVENOUS

## 2014-07-06 MED ORDER — POTASSIUM CHLORIDE CRYS ER 20 MEQ PO TBCR
40.0000 meq | EXTENDED_RELEASE_TABLET | Freq: Once | ORAL | Status: AC
  Administered 2014-07-06: 40 meq via ORAL
  Filled 2014-07-06: qty 2

## 2014-07-06 MED ORDER — VANCOMYCIN HCL IN DEXTROSE 1-5 GM/200ML-% IV SOLN
1000.0000 mg | Freq: Once | INTRAVENOUS | Status: AC
  Administered 2014-07-06: 1000 mg via INTRAVENOUS
  Filled 2014-07-06: qty 200

## 2014-07-06 MED ORDER — LEVOFLOXACIN IN D5W 750 MG/150ML IV SOLN
750.0000 mg | Freq: Once | INTRAVENOUS | Status: AC
  Administered 2014-07-06: 750 mg via INTRAVENOUS
  Filled 2014-07-06: qty 150

## 2014-07-06 MED ORDER — ALPRAZOLAM 0.5 MG PO TABS
0.5000 mg | ORAL_TABLET | Freq: Two times a day (BID) | ORAL | Status: DC
  Administered 2014-07-06 – 2014-07-10 (×8): 0.5 mg via ORAL
  Filled 2014-07-06: qty 2
  Filled 2014-07-06 (×8): qty 1

## 2014-07-06 MED ORDER — SODIUM CHLORIDE 0.9 % IV SOLN
1000.0000 mL | INTRAVENOUS | Status: DC
  Administered 2014-07-06 – 2014-07-07 (×2): 1000 mL via INTRAVENOUS

## 2014-07-06 NOTE — Procedures (Signed)
Central Venous Catheter Insertion Procedure Note Catherine Kelley [id number] [birth year]  Procedure: Insertion of Central Venous Catheter Indications: Assessment of intravascular volume, Drug and/or fluid administration and Frequent blood sampling  Procedure Details Consent: Risks of procedure as well as the alternatives and risks of each were explained to the (patient/caregiver).  Consent for procedure obtained. Time Out: Verified patient identification, verified procedure, site/side was marked, verified correct patient position, special equipment/implants available, medications/allergies/relevent history reviewed, required imaging and test results available.  Performed  Maximum sterile technique was used including antiseptics, cap, gloves, gown, hand hygiene, mask and sheet. Skin prep: Chlorhexidine; local anesthetic administered A antimicrobial bonded/coated triple lumen catheter was placed in the right subclavian vein using the Seldinger technique.  Evaluation Blood flow good Complications: No apparent complications Patient did tolerate procedure well. Chest X-ray ordered to verify placement.  CXR: pending.  BABCOCK,PETE 07/06/2014, 3:26 PM

## 2014-07-06 NOTE — Progress Notes (Signed)
ANTIBIOTIC CONSULT NOTE - INITIAL  Pharmacy Consult for levofloxacin, aztreonam and vancomycin Indication: rule out sepsis  Allergies  Allergen Reactions  . Augmentin [Amoxicillin-Pot Clavulanate]   . Ceclor [Cefaclor]     hives  . Escitalopram Oxalate     Pt does not recall ever taking medication  . Penicillins     rash  . Sertraline Hcl   . Sulfa Drugs Cross Reactors     Hives and rash    Patient Measurements: Height: 5\' 3"  (160 cm) Weight: 136 lb 11 oz (62 kg) IBW/kg (Calculated) : 52.4   Vital Signs: Temp: 102.8 F (39.3 C) (08/06 0950) Temp src: Rectal (08/06 0842) BP: 105/48 mmHg (08/06 0930) Pulse Rate: 85 (08/06 0930) Intake/Output from previous day:   Intake/Output from this shift:    Labs: No results found for this basename: WBC, HGB, PLT, LABCREA, CREATININE,  in the last 72 hours Estimated Creatinine Clearance: 59.5 ml/min (by C-G formula based on Cr of 0.55). No results found for this basename: VANCOTROUGH, VANCOPEAK, VANCORANDOM, GENTTROUGH, GENTPEAK, GENTRANDOM, TOBRATROUGH, TOBRAPEAK, TOBRARND, AMIKACINPEAK, AMIKACINTROU, AMIKACIN,  in the last 72 hours   Microbiology: No results found for this or any previous visit (from the past 720 hour(s)).  Medical History: Past Medical History  Diagnosis Date  . Hypertension     essential hypertension  . Tobacco abuse     smoking about 1/2 pk of cigarettes a day  . Anxiety   . Depression     psychiatrist Dr. Toy Care  . Respiratory infection   . Shortness of breath   . Malaise   . Lightheadedness   . Emphysematous COPD     changes in right base along with patchy areas on last x-ray  . COPD (chronic obstructive pulmonary disease)   . History of echocardiogram 12/24/2006    Est. EF of 16-55% NORMAL LV SYSTOLIC FUNCTION WITH IMPAIRED RELAXATION -- MILD AORTIC SCLEROSIS -- NORMAL PALONARY ARTERY PRESSURE -- NO OLD ECHOS FOR COMPARISON -- [name], MD  . History of cardiovascular stress test 08/15/2004     EF of 70% -- Normal stress cardiolite.  There is no evidence of ischemia and there is normal LV function. -- [name]. MD  . Diastolic dysfunction   . Allergy   . Asthma   . Heart murmur     Assessment: 67 YOF brought in by EMS with confusion after she was found down by her husband this morning. Patient states she was treated for PNA about 3 weeks ago; has had chills and fever for about a week. Last SCr on file from 06/08/2014- was 0.55, gives est CrCl of ~17mL/min. Aztreonam 2g IV x1, levofloxacin 750mg  IV x1 and vancomycin 1g IV x1 have been ordered by EDP.  Blood chemistries and cultures pending  Goal of Therapy:  Vancomycin trough level 15-20 mcg/ml  Plan:  1. Vancomycin 750mg  IV q12h 2. Levofloxacin 750mg  IV q24h 3. Aztreonam 2g IV q8h 4. Follow up renal function and adjust doses as needed 5. Follow c/s, clinical progression, trough at [facility] Wilkie, PharmD, BCPS Clinical Pharmacist Pager: [phone] 07/06/2014 10:22 AM

## 2014-07-06 NOTE — ED Notes (Signed)
Pt removed from oxygen by Dr. Winfred Leeds. Primary RN made aware.

## 2014-07-06 NOTE — ED Notes (Signed)
Per EMS- pt husband called EMS for con\fusion and "abnormal behavior". Pt was reported to have woke this morning at 630 and was "staggering". Pt was alert to self only when EMS arrived. Pt oriented to name and place at this time.

## 2014-07-06 NOTE — ED Notes (Signed)
Pt states that she has had a fever and chills for about a week and was recently treated for pneumonia approx 3 weeks ago

## 2014-07-06 NOTE — ED Notes (Signed)
MD aware of pts BP after fluid bolus. States to give additional 105ml NS

## 2014-07-06 NOTE — ED Notes (Signed)
Catherine Kelley with CCM at bedside.

## 2014-07-06 NOTE — H&P (Signed)
PULMONARY / CRITICAL CARE MEDICINE   Name: Catherine Kelley MRN: [id number] DOB: 09/08/1951    ADMISSION DATE:  07/06/2014 CONSULTATION DATE:  8/6  REFERRING MD :  Leanne Lovely   CHIEF COMPLAINT:  PNA/ AMS/ hypotension   INITIAL PRESENTATION:  63 year old female presented 8/6 w/ working dx of recurrent RLL pneumonia, newly complicated by acute encephalopathy and shock not responding to IVFs. PCCM asked to admit.     STUDIES:  CT chest 8/6>>> RLL consolidation CT head 8/6>>>  SIGNIFICANT EVENTS:    HISTORY OF PRESENT ILLNESS:   This is a 63 year old female, active smoker. Presents to the ER at [facility] on 8/6 after found on floor at home. She was confused and gait was unsteady after they assisted her to her feet. EMS was called. On arrival to ER she was noted to be hypotensive w/ low gd temp. CXR showed RLL consolidation. Further chart review and per interview w/ family pt had been treated for what was felt to be recurrent pneumonia since the end of May 2015. She has been treated with 1 round of azithro, 2 rounds of levaquin and most recently 10 days of doxy completed on 7/19. She states that her cough has not ever resolved, but she did feel better with antibiotics. Per pt her cough began to worsen yet again about 1 week prior to presentation. It was productive blood tinged sputum, and associated w/ fever, chills and some chest discomfort for the last week. In the ER she has remained hypotensive in spite of 3 liters NS. PCCM was asked to admit for sepsis/septic shock in setting of PNA.  PAST MEDICAL HISTORY :  Past Medical History  Diagnosis Date  . Hypertension     essential hypertension  . Tobacco abuse     smoking about 1/2 pk of cigarettes a day  . Anxiety   . Depression     psychiatrist Dr. Toy Care  . Respiratory infection   . Shortness of breath   . Malaise   . Lightheadedness   . Emphysematous COPD     changes in right base along with patchy areas on last x-ray  . COPD (chronic  obstructive pulmonary disease)   . History of echocardiogram 12/24/2006    Est. EF of 37-34% NORMAL LV SYSTOLIC FUNCTION WITH IMPAIRED RELAXATION -- MILD AORTIC SCLEROSIS -- NORMAL PALONARY ARTERY PRESSURE -- NO OLD ECHOS FOR COMPARISON -- [name], MD  . History of cardiovascular stress test 08/15/2004    EF of 70% -- Normal stress cardiolite.  There is no evidence of ischemia and there is normal LV function. -- [name]. MD  . Diastolic dysfunction   . Allergy   . Asthma   . Heart murmur    Past Surgical History  Procedure Laterality Date  . Tubal ligation     Prior to Admission medications   Medication Sig Start Date End Date Taking? Authorizing Provider  albuterol (PROVENTIL HFA;VENTOLIN HFA) 108 (90 BASE) MCG/ACT inhaler Inhale 2 puffs into the lungs every 6 (six) hours as needed for wheezing or shortness of breath.   Yes Historical Provider, MD  ALPRAZolam Duanne Moron) 0.5 MG tablet Take 0.5 mg by mouth 2 (two) times daily.   Yes Historical Provider, MD  Calcium Carbonate-Vitamin D (CALCIUM-VITAMIN D) 500-200 MG-UNIT per tablet Take 1 tablet by mouth daily.   Yes Historical Provider, MD  divalproex (DEPAKOTE ER) 500 MG 24 hr tablet Take 500 mg by mouth at bedtime.  02/22/12  Yes Historical Provider, MD  furosemide (LASIX) 20 MG tablet Take 40 mg by mouth daily.   Yes Historical Provider, MD  losartan-hydrochlorothiazide (HYZAAR) 50-12.5 MG per tablet Take 0.5 tablets by mouth daily.   Yes [name], MD  Multiple Vitamin (MULTIVITAMIN) tablet Take 1 tablet by mouth daily.   Yes Historical Provider, MD  nadolol (CORGARD) 40 MG tablet Take 20 mg by mouth daily.   Yes Historical Provider, MD  nortriptyline (PAMELOR) 50 MG capsule Take 100 mg by mouth at bedtime.   Yes Historical Provider, MD   Allergies  Allergen Reactions  . Augmentin [Amoxicillin-Pot Clavulanate]   . Ceclor [Cefaclor]     hives  . Escitalopram Oxalate     Pt does not recall ever taking medication   . Penicillins     rash  . Sertraline Hcl   . Sulfa Drugs Cross Reactors     Hives and rash    FAMILY HISTORY:  Family History  Problem Relation Age of Onset  . Heart disease Mother   . Stroke Mother   . Heart disease Father   . Hyperlipidemia Father   . Hypertension Father   . Cancer Maternal Aunt    SOCIAL HISTORY:  reports that she has been smoking Cigarettes.  She has a 11.5 pack-year smoking history. She does not have any smokeless tobacco history on file. She reports that she does not drink alcohol or use illicit drugs.  Review of Systems:   Bolds are positive  Constitutional: weight loss, gain, night sweats, Fevers, chills, fatigue .  HEENT: headaches, Sore throat, sneezing, nasal congestion, post nasal drip, Difficulty swallowing, Tooth/dental problems, visual complaints visual changes, ear ache CV:  chest pain, radiates: ,Orthopnea, PND, swelling in lower extremities, dizziness, palpitations, syncope.  GI  heartburn, indigestion, abdominal pain, nausea, vomiting, diarrhea, change in bowel habits, loss of appetite, bloody stools. Constipation  Resp: cough, productive yellow/green and now blood tinged, dyspnea progressive , chest pain, pleuritic.  Skin: rash or itching or icterus GU: dysuria, change in color of urine, urgency or frequency. flank pain, hematuria  MS: joint pain or swelling. decreased range of motion  Psych: change in mood or affect. depression or anxiety.  Neuro: difficulty with speech, weakness, numbness, ataxia    SUBJECTIVE:  Not in acute distress  VITAL SIGNS: Temp:  [99.1 F (37.3 C)-102.8 F (39.3 C)] 99.1 F (37.3 C) (08/06 1132) Pulse Rate:  [69-91] 69 (08/06 1300) Resp:  [16-23] 19 (08/06 1300) BP: (82-105)/(42-57) 92/48 mmHg (08/06 1300) SpO2:  [90 %-97 %] 94 % (08/06 1300) Weight:  [62 kg (136 lb 11 oz)] 62 kg (136 lb 11 oz) (08/06 0950) HEMODYNAMICS:   VENTILATOR SETTINGS:   INTAKE / OUTPUT:  Intake/Output Summary (Last 24 hours)  at 07/06/14 1314 Last data filed at 07/06/14 1302  Gross per 24 hour  Intake   3000 ml  Output      0 ml  Net   3000 ml    PHYSICAL EXAMINATION: General:  Chronically ill appearing white female, not in acute distress.  Neuro:  Awake, oriented, no focal def  HEENT:  Parks, no JVD, poor dentition, MM dry  Cardiovascular:  rrr Lungs:  Decreased RLL, crackles at Right base, no accessory muscle use  Abdomen:  Soft, non-tender  Musculoskeletal:  Intact  Skin:  Intact   LABS:  PULMONARY  Recent Labs Lab 07/06/14 1116  PHART 7.473*  PCO2ART 37.9  PO2ART 59.0*  HCO3 27.7*  TCO2 29  O2SAT 92.0  CBC  Recent Labs Lab 07/06/14 1040  HGB 11.8*  HCT 33.7*  WBC 14.5*  PLT 205    COAGULATION  Recent Labs Lab 07/06/14 1312  INR 1.42    CARDIAC  No results found for this basename: TROPONINI,  in the last 168 hours No results found for this basename: PROBNP,  in the last 168 hours   CHEMISTRY  Recent Labs Lab 07/06/14 1040  NA 119*  K 2.9*  CL 78*  CO2 27  GLUCOSE 98  BUN 19  CREATININE 0.63  CALCIUM 8.3*   Estimated Creatinine Clearance: 59.5 ml/min (by C-G formula based on Cr of 0.63).   LIVER  Recent Labs Lab 07/06/14 1040 07/06/14 1312  AST 30  --   ALT 17  --   ALKPHOS 120*  --   BILITOT 0.5  --   PROT 7.0  --   ALBUMIN 2.3*  --   INR  --  1.42     INFECTIOUS  Recent Labs Lab 07/06/14 1107 07/06/14 1312  LATICACIDVEN 1.12 1.2     ENDOCRINE CBG (last 3)   Recent Labs  07/06/14 0833  GLUCAP 132*         IMAGING x48h No results found.   Imaging No results found.   ASSESSMENT / PLAN:  PULMONARY  A: Smoker with cxr evidence of undiagnosed copd  Recurrent/ progressive RLL PNA sine May 2015. Concerned that this process has been going on since end of May. Worried about a malignancy endobronchial lesion v Focal BOOP as underlying etiology.  Currently Rx as CAP Hemoptysis: ? PNA ? Endobronchial involvement     P:   CT chest r/o mass Scheduled BDs O2 as needed pulm hygiene  May need FOB (now if intubated or as opd)  CARDIOVASCULAR CVL A: Severe sepsis/septic shock P:  Repeat NS bolus (#4) if no improvement will need central access and pressors Ck cortisol MAP goal >65 See ID section   RENAL A:   Hyponatremia  P:   Ck urine Na, creatinine and Osmo  Ck TSH Replace KCL  F/u chem 1800 8/6   GASTROINTESTINAL A:   constipation  GERD P:   ppi LOC  Stool softness   HEMATOLOGIC A:  Anemia  No evidence of bleeding other than intermittent hemoptysis  P:  Trend CBC scds for now   INFECTIOUS A:   Pneumonia (recurrent). Concerned about either resistant or post-obstructive process.  R/o UTI  P:   BCx2 8/6>>> UC 8/6>>> Sputum 8/6>>> Abx: vanc, start date 8/6, day 0/X Abx: levaquin, start date 8/6, day 0/X Abx: azactam, start date 8/6, day 0/X U strep 8/6>>> U legionella 8/6>>>  ENDOCRINE A:   No active issues  P:   Random cortisol 8/6>>>  NEUROLOGIC A:   Acute encephalopathy Gait disturbance  H/o depression  P:   RASS goal: 0 CT head, may help explain her gait disturbance and encephalopathy 8/6>>>  TODAY'S SUMMARY:  Will admit to the intensive care. Concerned that this pneumonia has not cleared w/ what appears to be adequate antibiotic therapy. Given her smoking history concerned about the possibility of a malignancy and post-obstructive process. Will Get CT chest to better evaluate her pneumonia and look for lung mass, CT head to help evaluate her gait issues and behavioral changes, continue broad spec abx and IVF resuscitation efforts. IF she does not respond to the 4th liter on saline will need central access and pressors. Depending on course may also need to consider FOB  given the report of hemoptysis.   Marni Griffon ACNP Pulmonary and [place] Pager: [phone]  07/06/2014, 1:14 PM   STAFF NOTE: I, Dr Ann Lions have personally reviewed patient's available data, including medical history, events of note, physical examination and test results as part of my evaluation. I have discussed with resident/NP and other care providers such as pharmacist, RN and RRT.  In addition,  I personally evaluated patient and elicited key findings of RLL pneumonia - Rx as CAP with septic shock. However, has had recurrent abx Rx and intermittent process since May 2015. Therefore, after septic shock resus would benefit from bronch to evaluate RLL either as inpatient or outpatient. Definitely needs opd pulm followup.  Rest per NP/medical resident whose note is outlined above and that I agree with  The patient is critically ill with multiple organ systems failure and requires high complexity decision making for assessment and support, frequent evaluation and titration of therapies, application of advanced monitoring technologies and extensive interpretation of multiple databases.   Critical Care Time devoted to patient care services described in this note is  35  Minutes.  Dr. Brand Males, M.D., [facility].C.P Pulmonary and Critical Care Medicine Staff Physician [place] [facility] Pager: [phone], If no answer or between  15:00h - 7:00h: call [phone]  07/06/2014 2:40 PM

## 2014-07-06 NOTE — ED Notes (Signed)
family states that pt was found on the kitchen floor this morning. Pt does not recall event. Pt states that she is unsure if she hit her head.

## 2014-07-06 NOTE — Procedures (Signed)
Staff note  Personally supervised procedure. Real time 2D ultrasound used for vein site selection, patency assessment, and needle entry. / A record of image was made but could not be submitted for filing due to malfunction of printing device  Dr. Brand Males, M.D., [facility].C.P Pulmonary and Critical Care Medicine Staff Physician [place] [facility] and [facility] Pager: [phone], If no answer or between  15:00h - 7:00h: call [phone]  07/06/2014 4:23 PM

## 2014-07-06 NOTE — ED Notes (Signed)
MD at bedside. 

## 2014-07-06 NOTE — ED Provider Notes (Addendum)
CSN: [id number]     Arrival date & time 07/06/14  0822 History   None    Chief Complaint  Patient presents with  . Altered Mental Status     (Consider location/radiation/quality/duration/timing/severity/associated sxs/prior Treatment) HPI Patient's husband found her to be confused this morning. Patient had fallen on the floor. She did not know where she was. Patient reports she's been coughing for several weeks. She also reports burning with urination for several days. And complains of generalized malaise. Denies nausea or vomiting. Patient has been treated with several doses of antibiotics over the past several weeks for "bronchitis." Past Medical History  Diagnosis Date  . Hypertension     essential hypertension  . Tobacco abuse     smoking about 1/2 pk of cigarettes a day  . Anxiety   . Depression     psychiatrist Dr. Toy Care  . Respiratory infection   . Shortness of breath   . Malaise   . Lightheadedness   . Emphysematous COPD     changes in right base along with patchy areas on last x-ray  . COPD (chronic obstructive pulmonary disease)   . History of echocardiogram 12/24/2006    Est. EF of 20-25% NORMAL LV SYSTOLIC FUNCTION WITH IMPAIRED RELAXATION -- MILD AORTIC SCLEROSIS -- NORMAL PALONARY ARTERY PRESSURE -- NO OLD ECHOS FOR COMPARISON -- [name], MD  . History of cardiovascular stress test 08/15/2004    EF of 70% -- Normal stress cardiolite.  There is no evidence of ischemia and there is normal LV function. -- [name]. MD  . Diastolic dysfunction   . Allergy   . Asthma   . Heart murmur    Past Surgical History  Procedure Laterality Date  . Tubal ligation     Family History  Problem Relation Age of Onset  . Heart disease Mother   . Stroke Mother   . Heart disease Father   . Hyperlipidemia Father   . Hypertension Father   . Cancer Maternal Aunt    History  Substance Use Topics  . Smoking status: Current Every Day Smoker -- 0.25 packs/day for 46  years    Types: Cigarettes    Last Attempt to Quit: 04/17/2014  . Smokeless tobacco: Not on file  . Alcohol Use: No   OB History   Grav Para Term Preterm Abortions TAB SAB Ect Mult Living                 Review of Systems  Constitutional: Negative.   HENT: Negative.   Respiratory: Positive for cough.   Cardiovascular: Negative.   Gastrointestinal: Negative.   Genitourinary: Positive for dysuria.  Musculoskeletal: Negative.   Skin: Negative.   Neurological: Negative.        Confused  Psychiatric/Behavioral: Negative.   All other systems reviewed and are negative.     Allergies  Augmentin; Ceclor; Escitalopram oxalate; Penicillins; Sertraline hcl; and Sulfa drugs cross reactors  Home Medications   Prior to Admission medications   Medication Sig Start Date End Date Taking? Authorizing Provider  ALPRAZolam (XANAX PO) Take 0.5 mg by mouth at bedtime and may repeat dose one time if needed.     Historical Provider, MD  aspirin 81 MG tablet Take 81 mg by mouth daily. Take 2 tabs daily    Historical Provider, MD  Calcium Carbonate-Vitamin D (CALCIUM-VITAMIN D) 500-200 MG-UNIT per tablet Take 1 tablet by mouth daily.    Historical Provider, MD  divalproex (DEPAKOTE ER) 500 MG 24  hr tablet Take 500 mg by mouth daily.  02/22/12   Historical Provider, MD  doxycycline (VIBRAMYCIN) 100 MG capsule Take 1 capsule (100 mg total) by mouth 2 (two) times daily. 06/08/14   Wardell Honour, MD  furosemide (LASIX) 20 MG tablet TAKE 2 TABLETS (40 MG TOTAL) BY MOUTH DAILY.    [name], MD  HYDROcodone-acetaminophen (HYCET) 7.5-325 mg/15 ml solution Take 5 mLs by mouth every 6 (six) hours as needed (or cough). 04/25/14   Orma Flaming, MD  levofloxacin (LEVAQUIN) 750 MG tablet Take 1 tablet (750 mg total) by mouth daily. 05/25/14   Wardell Honour, MD  losartan-hydrochlorothiazide (HYZAAR) 50-12.5 MG per tablet Take 0.5 tablets by mouth daily.    [name], MD  Multiple Vitamin  (MULTIVITAMIN) tablet Take 1 tablet by mouth daily.    Historical Provider, MD  nadolol (CORGARD) 40 MG tablet TAKE 1/2 TABLET BY MOUTH EVERY DAY    [name], MD  NORTRIPTYLINE HCL PO Take 2 tablets by mouth at bedtime.      Historical Provider, MD   BP 97/53  Pulse 90  Temp(Src) 102.8 F (39.3 C) (Rectal)  Resp 20  SpO2 90% Physical Exam  Nursing note and vitals reviewed. Constitutional: She appears well-developed and well-nourished. No distress.  Poor dentition mucous membranes dry  HENT:  Head: Normocephalic and atraumatic.  Eyes: Conjunctivae are normal. Pupils are equal, round, and reactive to light.  Neck: Neck supple. No tracheal deviation present. No thyromegaly present.  Cardiovascular: Normal rate and regular rhythm.   No murmur heard. Pulmonary/Chest: Effort normal. No respiratory distress.  Rales right side posteriorly  Abdominal: Soft. Bowel sounds are normal. She exhibits no distension. There is no tenderness.  Musculoskeletal: Normal range of motion. She exhibits no edema and no tenderness.  Neurological: She is alert. Coordination normal.  Skin: Skin is warm and dry. No rash noted.  Psychiatric: She has a normal mood and affect.    ED Course  Procedures (including critical care time) Labs Review Labs Reviewed  CBG MONITORING, ED - Abnormal; Notable for the following:    Glucose-Capillary 132 (*)    All other components within normal limits    Imaging Review No results found.   EKG Interpretation None      Date: 07/06/2014  Rate: 90  Rhythm: normal sinus rhythm  QRS Axis: normal  Intervals: normal  ST/T Wave abnormalities: nonspecific T wave changes  Conduction Disutrbances:none  Narrative Interpretation:   Old EKG Reviewed: No significant change over 11/14/2006 as interpreted by me Chest xray viewed by me Results for orders placed during the hospital encounter of 07/06/14  CBC      Result Value Ref Range   WBC 14.5 (*) 4.0 - 10.5 K/uL    RBC 3.80 (*) 3.87 - 5.11 MIL/uL   Hemoglobin 11.8 (*) 12.0 - 15.0 g/dL   HCT 33.7 (*) 36.0 - 46.0 %   MCV 88.7  78.0 - 100.0 fL   MCH 31.1  26.0 - 34.0 pg   MCHC 35.0  30.0 - 36.0 g/dL   RDW 13.5  11.5 - 15.5 %   Platelets 205  150 - 400 K/uL  COMPREHENSIVE METABOLIC PANEL      Result Value Ref Range   Sodium 119 (*) 137 - 147 mEq/L   Potassium 2.9 (*) 3.7 - 5.3 mEq/L   Chloride 78 (*) 96 - 112 mEq/L   CO2 27  19 - 32 mEq/L   Glucose, Bld 98  70 - 99 mg/dL   BUN 19  6 - 23 mg/dL   Creatinine, Ser 0.63  0.50 - 1.10 mg/dL   Calcium 8.3 (*) 8.4 - 10.5 mg/dL   Total Protein 7.0  6.0 - 8.3 g/dL   Albumin 2.3 (*) 3.5 - 5.2 g/dL   AST 30  0 - 37 U/L   ALT 17  0 - 35 U/L   Alkaline Phosphatase 120 (*) 39 - 117 U/L   Total Bilirubin 0.5  0.3 - 1.2 mg/dL   GFR calc non Af Amer >90  >90 mL/min   GFR calc Af Amer >90  >90 mL/min   Anion gap 14  5 - 15  URINALYSIS, ROUTINE W REFLEX MICROSCOPIC      Result Value Ref Range   Color, Urine YELLOW  YELLOW   APPearance HAZY (*) CLEAR   Specific Gravity, Urine 1.013  1.005 - 1.030   pH 6.0  5.0 - 8.0   Glucose, UA NEGATIVE  NEGATIVE mg/dL   Hgb urine dipstick SMALL (*) NEGATIVE   Bilirubin Urine NEGATIVE  NEGATIVE   Ketones, ur NEGATIVE  NEGATIVE mg/dL   Protein, ur NEGATIVE  NEGATIVE mg/dL   Urobilinogen, UA 2.0 (*) 0.0 - 1.0 mg/dL   Nitrite NEGATIVE  NEGATIVE   Leukocytes, UA SMALL (*) NEGATIVE  VALPROIC ACID LEVEL      Result Value Ref Range   Valproic Acid Lvl 45.3 (*) 50.0 - 100.0 ug/mL  URINE MICROSCOPIC-ADD ON      Result Value Ref Range   Squamous Epithelial / LPF FEW (*) RARE   WBC, UA 11-20  <3 WBC/hpf   RBC / HPF    <3 RBC/hpf   Value: NO FORMED ELEMENTS SEEN ON URINE MICROSCOPIC EXAMINATION   Bacteria, UA MANY (*) RARE   Casts WBC CAST (*) NEGATIVE  CBG MONITORING, ED      Result Value Ref Range   Glucose-Capillary 132 (*) 70 - 99 mg/dL  I-STAT CG4 LACTIC ACID, ED      Result Value Ref Range   Lactic Acid, Venous  1.12  0.5 - 2.2 mmol/L  I-STAT ARTERIAL BLOOD GAS, ED      Result Value Ref Range   pH, Arterial 7.473 (*) 7.350 - 7.450   pCO2 arterial 37.9  35.0 - 45.0 mmHg   pO2, Arterial 59.0 (*) 80.0 - 100.0 mmHg   Bicarbonate 27.7 (*) 20.0 - 24.0 mEq/L   TCO2 29  0 - 100 mmol/L   O2 Saturation 92.0     Acid-Base Excess 4.0 (*) 0.0 - 2.0 mmol/L   Patient temperature 98.6 F     Collection site RADIAL, ALLEN'S TEST ACCEPTABLE     Drawn by Operator     Sample type ARTERIAL     Dg Chest 2 View  06/08/2014   CLINICAL DATA:  Followup pneumonia  EXAM: CHEST  2 VIEW  COMPARISON:  05/25/2014  FINDINGS: Cardiac shadow is within normal limits. The lungs are well aerated bilaterally. The patchy changes seen in the right lung base are stable when compare with the prior exam. This may represent a component of overall scarring. Continued followup is recommended. No new focal abnormality is seen.  IMPRESSION: Persistent changes in the right lung base likely related underlying scarring. Continued followup is recommended.   Electronically Signed   By: Inez Catalina M.D.   On: 06/08/2014 21:01   Dg Chest Port 1 View  07/06/2014   CLINICAL DATA:  Right-sided chest pain.  EXAM: PORTABLE CHEST - 1 VIEW  COMPARISON:  06/08/2014  FINDINGS: There is now more confluent type opacity at the right lung base. No other areas of lung consolidation. No evidence of pulmonary edema. No convincing pleural effusion and no pneumothorax.  Cardiac silhouette is normal in size. Normal mediastinal and hilar contours.  Bony thorax is demineralized but grossly intact.  IMPRESSION: New right lung base consolidation consistent with pneumonia in the proper clinical setting.   Electronically Signed   By: Lajean Manes M.D.   On: 07/06/2014 10:31    12:20 PM patient remains alert Glasgow Coma Score 15. She remains hypotensive with blood pressure 82 systolic. Additional IV fluids saline bolus ordered. MDM  Spoke with critical care team who will arrange  for admission Diagnosis #1 community-acquired pneumonia #2 sepsis #3 hypotension #4 hypokalemia #5 hyponatremia Final diagnoses:  None  #6 hypoxia CRITICAL CARE Performed by: Orlie Dakin Total critical care time: 40 minute Critical care time was exclusive of separately billable procedures and treating other patients. Critical care was necessary to treat or prevent imminent or life-threatening deterioration. Critical care was time spent personally by me on the following activities: development of treatment plan with patient and/or surrogate as well as nursing, discussions with consultants, evaluation of patient's response to treatment, examination of patient, obtaining history from patient or surrogate, ordering and performing treatments and interventions, ordering and review of laboratory studies, ordering and review of radiographic studies, pulse oximetry and re-evaluation of patient's condition.     Orlie Dakin, MD 07/06/14 [place], MD 07/06/14 [phone]

## 2014-07-07 ENCOUNTER — Inpatient Hospital Stay (HOSPITAL_COMMUNITY): Payer: Managed Care, Other (non HMO)

## 2014-07-07 LAB — LEGIONELLA ANTIGEN, URINE: Legionella Antigen, Urine: NEGATIVE

## 2014-07-07 LAB — BASIC METABOLIC PANEL
Anion gap: 9 (ref 5–15)
BUN: 5 mg/dL — ABNORMAL LOW (ref 6–23)
CHLORIDE: 94 meq/L — AB (ref 96–112)
CO2: 24 meq/L (ref 19–32)
CREATININE: 0.44 mg/dL — AB (ref 0.50–1.10)
Calcium: 7.5 mg/dL — ABNORMAL LOW (ref 8.4–10.5)
GFR calc Af Amer: >90 mL/min (ref >90)
GFR calc non Af Amer: >90 mL/min (ref >90)
Glucose, Bld: 94 mg/dL (ref 70–99)
Potassium: 3.7 mEq/L (ref 3.7–5.3)
Sodium: 127 mEq/L — ABNORMAL LOW (ref 137–147)

## 2014-07-07 LAB — BLOOD GAS, ARTERIAL
Acid-Base Excess: 1.5 mmol/L (ref 0.0–2.0)
Bicarbonate: 24.5 mEq/L — ABNORMAL HIGH (ref 20.0–24.0)
DRAWN BY: 31101
FIO2: 0.28 %
O2 Saturation: 97.4 %
PCO2 ART: 31.7 mmHg — AB (ref 35.0–45.0)
PH ART: 7.5 — AB (ref 7.350–7.450)
Patient temperature: 98.6
TCO2: 25.5 mmol/L (ref 0–100)
pO2, Arterial: 81.6 mmHg (ref 80.0–100.0)

## 2014-07-07 LAB — URINE CULTURE
CULTURE: NO GROWTH
Colony Count: NO GROWTH

## 2014-07-07 LAB — GLUCOSE, CAPILLARY
GLUCOSE-CAPILLARY: 100 mg/dL — AB (ref 70–99)
GLUCOSE-CAPILLARY: 110 mg/dL — AB (ref 70–99)
GLUCOSE-CAPILLARY: 81 mg/dL (ref 70–99)
GLUCOSE-CAPILLARY: 91 mg/dL (ref 70–99)
Glucose-Capillary: 122 mg/dL — ABNORMAL HIGH (ref 70–99)
Glucose-Capillary: 82 mg/dL (ref 70–99)
Glucose-Capillary: 126 mg/dL — ABNORMAL HIGH (ref 70–99)

## 2014-07-07 LAB — CBC
HCT: 31.3 % — ABNORMAL LOW (ref 36.0–46.0)
HEMOGLOBIN: 10.6 g/dL — AB (ref 12.0–15.0)
MCH: 30.3 pg (ref 26.0–34.0)
MCHC: 33.9 g/dL (ref 30.0–36.0)
MCV: 89.4 fL (ref 78.0–100.0)
PLATELETS: 212 10*3/uL (ref 150–400)
RBC: 3.5 MIL/uL — AB (ref 3.87–5.11)
RDW: 13.8 % (ref 11.5–15.5)
WBC: 13.1 10*3/uL — AB (ref 4.0–10.5)

## 2014-07-07 MED ORDER — POTASSIUM CHLORIDE CRYS ER 20 MEQ PO TBCR
20.0000 meq | EXTENDED_RELEASE_TABLET | ORAL | Status: AC
  Administered 2014-07-07 (×2): 20 meq via ORAL
  Filled 2014-07-07 (×2): qty 1

## 2014-07-07 MED ORDER — ALBUTEROL SULFATE (2.5 MG/3ML) 0.083% IN NEBU
2.5000 mg | INHALATION_SOLUTION | RESPIRATORY_TRACT | Status: DC | PRN
  Administered 2014-07-07 – 2014-07-10 (×2): 2.5 mg via RESPIRATORY_TRACT
  Filled 2014-07-07 (×2): qty 3

## 2014-07-07 MED ORDER — SODIUM CHLORIDE 0.9 % IV SOLN
1000.0000 mL | INTRAVENOUS | Status: DC
  Administered 2014-07-09: 1000 mL via INTRAVENOUS

## 2014-07-07 NOTE — Progress Notes (Signed)
PULMONARY / CRITICAL CARE MEDICINE   Name: Catherine Kelley MRN: [id number] DOB: [birth year]    ADMISSION DATE:  07/06/2014 CONSULTATION DATE:  8/6  REFERRING MD :  Leanne Lovely   CHIEF COMPLAINT:  PNA/ AMS/ hypotension   INITIAL PRESENTATION:  63 year old female presented 8/6 w/ working dx of recurrent RLL pneumonia, newly complicated by acute encephalopathy and shock not responding to IVFs. PCCM asked to admit.     STUDIES:  CT chest 8/6>>> RLL consolidation CT head 8/6>>>neg   SIGNIFICANT EVENTS: 8/7 feeling better.   SUBJECTIVE:  Not in acute distress  VITAL SIGNS: Temp:  [97.7 F (36.5 C)-99.9 F (37.7 C)] 98.4 F (36.9 C) (08/07 1220) Pulse Rate:  [30-101] 81 (08/07 1300) Resp:  [14-29] 25 (08/07 1300) BP: (73-165)/(26-126) 111/52 mmHg (08/07 1300) SpO2:  [87 %-100 %] 98 % (08/07 1300) Weight:  [68 kg (149 lb 14.6 oz)] 68 kg (149 lb 14.6 oz) (08/06 1600) 1 liter  HEMODYNAMICS: CVP:  [0 mmHg-21 mmHg] 6 mmHg VENTILATOR SETTINGS:   INTAKE / OUTPUT:  Intake/Output Summary (Last 24 hours) at 07/07/14 1325 Last data filed at 07/07/14 1300  Gross per 24 hour  Intake 3959.21 ml  Output   8065 ml  Net -4105.79 ml    PHYSICAL EXAMINATION: General:  Chronically ill appearing white female, not in acute distress.  Neuro:  Awake, oriented, no focal def  HEENT:  [place], no JVD, poor dentition, MM dry  Cardiovascular:  rrr Lungs:  occ rhonchi, crackles at Right base, no accessory muscle use  Abdomen:  Soft, non-tender  Musculoskeletal:  Intact  Skin:  Intact   LABS:  PULMONARY  Recent Labs Lab 07/06/14 1116 07/06/14 1548 07/07/14 0255  PHART 7.473*  --  7.500*  PCO2ART 37.9  --  31.7*  PO2ART 59.0*  --  81.6  HCO3 27.7*  --  24.5*  TCO2 29  --  25.5  O2SAT 92.0 62.5 97.4    CBC  Recent Labs Lab 07/06/14 1040 07/07/14 0455  HGB 11.8* 10.6*  HCT 33.7* 31.3*  WBC 14.5* 13.1*  PLT 205 212    COAGULATION  Recent Labs Lab 07/06/14 1312  INR 1.42     CARDIAC  No results found for this basename: TROPONINI,  in the last 168 hours No results found for this basename: PROBNP,  in the last 168 hours   CHEMISTRY  Recent Labs Lab 07/06/14 1040 07/06/14 1800 07/07/14 0455  NA 119* 132* 127*  K 2.9* 5.2 3.7  CL 78* 99 94*  CO2 27 25 24   GLUCOSE 98 116* 94  BUN 19 10 5*  CREATININE 0.63 0.45* 0.44*  CALCIUM 8.3* 7.6* 7.5*   Estimated Creatinine Clearance: 64.8 ml/min (by C-G formula based on Cr of 0.44).   LIVER  Recent Labs Lab 07/06/14 1040 07/06/14 1312  AST 30  --   ALT 17  --   ALKPHOS 120*  --   BILITOT 0.5  --   PROT 7.0  --   ALBUMIN 2.3*  --   INR  --  1.42     INFECTIOUS  Recent Labs Lab 07/06/14 1107 07/06/14 1312 07/06/14 1453  LATICACIDVEN 1.12 1.2 0.8     ENDOCRINE CBG (last 3)   Recent Labs  07/07/14 0357 07/07/14 0806 07/07/14 1218  GLUCAP 100* 110* 122*   IMAGING x48h Ct Head Wo Contrast  07/06/2014   CLINICAL DATA:  Change in mental status.  EXAM: CT HEAD WITHOUT CONTRAST  TECHNIQUE: Contiguous  axial images were obtained from the base of the skull through the vertex without intravenous contrast.  COMPARISON:  Sinus series 9 08/2012.  FINDINGS: No mass. No hydrocephalus. No hemorrhage. No acute bony abnormality. Severe mucosal thickening with inspissated mucus noted of the maxillary sinuses consistent chronic sinusitis. Mastoids are clear.  IMPRESSION: 1.  Severe chronic bilateral maxillary sinusitis.  2.  No acute intracranial abnormality otherwise noted.   Electronically Signed   By: Marcello Moores  Register   On: 07/06/2014 14:53   Ct Chest Wo Contrast  07/06/2014   CLINICAL DATA:  Evaluate right lung consolidation  EXAM: CT CHEST WITHOUT CONTRAST  TECHNIQUE: Multidetector CT imaging of the chest was performed following the standard protocol without IV contrast.  COMPARISON:  07/06/2014  FINDINGS: Minor atherosclerotic changes of the major branch vessels and aortic arch. Coronary  calcifications noted. Normal heart size. No pericardial effusion. Suspect a trace dependent right pleural effusion. Mildly prominent subcarinal and paratracheal lymph nodes, suspect reactive. Lymph nodes are difficult to assess without IV contrast.  Lung windows demonstrate mild upper lobe predominant centrilobular emphysema. Dense extensive consolidation throughout the entire right lower lobe with central air bronchograms. This is most compatible with acute consolidative pneumonia. Scattered areas of subpleural atelectasis and/or scarring bilaterally. Right upper lobe, right middle lobe, and left lung are not involved. Difficult to exclude an obscured mass within the right lower lobe consolidation. Recommend plain radiographic followup to document complete resolution.  Included upper abdomen demonstrates no acute finding.  IMPRESSION: Dense extensive right lower lobe consolidative pneumonia. Cannot exclude underlying obscured right lower lobe mass. Recommend radiographic follow-up to document resolution.   Electronically Signed   By: Daryll Brod M.D.   On: 07/06/2014 14:58   Dg Chest Port 1 View  07/06/2014   CLINICAL DATA:  Central line placement.  Right lower lobe pneumonia.  EXAM: PORTABLE CHEST - 1 VIEW  COMPARISON:  Chest x-ray and chest CT dated 07/06/2014  FINDINGS: Right subclavian catheter has been inserted and the tip is in the superior vena cava just above the cavoatrial junction in good position.  The consolidative pneumonia in the right lower lobe is essentially unchanged. Left lung is clear. Heart size and vascularity are normal.  No pneumothorax.  IMPRESSION: Central catheter in good position with no pneumothorax. Dense consolidative pneumonia in the right lower lobe, stable.   Electronically Signed   By: Rozetta Nunnery M.D.   On: 07/06/2014 15:47   Dg Chest Port 1 View  07/06/2014   CLINICAL DATA:  Right-sided chest pain.  EXAM: PORTABLE CHEST - 1 VIEW  COMPARISON:  06/08/2014  FINDINGS: There  is now more confluent type opacity at the right lung base. No other areas of lung consolidation. No evidence of pulmonary edema. No convincing pleural effusion and no pneumothorax.  Cardiac silhouette is normal in size. Normal mediastinal and hilar contours.  Bony thorax is demineralized but grossly intact.  IMPRESSION: New right lung base consolidation consistent with pneumonia in the proper clinical setting.   Electronically Signed   By: Lajean Manes M.D.   On: 07/06/2014 10:31     Imaging Ct Head Wo Contrast  07/06/2014   CLINICAL DATA:  Change in mental status.  EXAM: CT HEAD WITHOUT CONTRAST  TECHNIQUE: Contiguous axial images were obtained from the base of the skull through the vertex without intravenous contrast.  COMPARISON:  Sinus series 9 08/2012.  FINDINGS: No mass. No hydrocephalus. No hemorrhage. No acute bony abnormality. Severe mucosal thickening with inspissated  mucus noted of the maxillary sinuses consistent chronic sinusitis. Mastoids are clear.  IMPRESSION: 1.  Severe chronic bilateral maxillary sinusitis.  2.  No acute intracranial abnormality otherwise noted.   Electronically Signed   By: Marcello Moores  Register   On: 07/06/2014 14:53   Ct Chest Wo Contrast  07/06/2014   CLINICAL DATA:  Evaluate right lung consolidation  EXAM: CT CHEST WITHOUT CONTRAST  TECHNIQUE: Multidetector CT imaging of the chest was performed following the standard protocol without IV contrast.  COMPARISON:  07/06/2014  FINDINGS: Minor atherosclerotic changes of the major branch vessels and aortic arch. Coronary calcifications noted. Normal heart size. No pericardial effusion. Suspect a trace dependent right pleural effusion. Mildly prominent subcarinal and paratracheal lymph nodes, suspect reactive. Lymph nodes are difficult to assess without IV contrast.  Lung windows demonstrate mild upper lobe predominant centrilobular emphysema. Dense extensive consolidation throughout the entire right lower lobe with central air  bronchograms. This is most compatible with acute consolidative pneumonia. Scattered areas of subpleural atelectasis and/or scarring bilaterally. Right upper lobe, right middle lobe, and left lung are not involved. Difficult to exclude an obscured mass within the right lower lobe consolidation. Recommend plain radiographic followup to document complete resolution.  Included upper abdomen demonstrates no acute finding.  IMPRESSION: Dense extensive right lower lobe consolidative pneumonia. Cannot exclude underlying obscured right lower lobe mass. Recommend radiographic follow-up to document resolution.   Electronically Signed   By: Daryll Brod M.D.   On: 07/06/2014 14:58   Dg Chest Port 1 View  07/06/2014   CLINICAL DATA:  Central line placement.  Right lower lobe pneumonia.  EXAM: PORTABLE CHEST - 1 VIEW  COMPARISON:  Chest x-ray and chest CT dated 07/06/2014  FINDINGS: Right subclavian catheter has been inserted and the tip is in the superior vena cava just above the cavoatrial junction in good position.  The consolidative pneumonia in the right lower lobe is essentially unchanged. Left lung is clear. Heart size and vascularity are normal.  No pneumothorax.  IMPRESSION: Central catheter in good position with no pneumothorax. Dense consolidative pneumonia in the right lower lobe, stable.   Electronically Signed   By: Rozetta Nunnery M.D.   On: 07/06/2014 15:47   Dg Chest Port 1 View  07/06/2014   CLINICAL DATA:  Right-sided chest pain.  EXAM: PORTABLE CHEST - 1 VIEW  COMPARISON:  06/08/2014  FINDINGS: There is now more confluent type opacity at the right lung base. No other areas of lung consolidation. No evidence of pulmonary edema. No convincing pleural effusion and no pneumothorax.  Cardiac silhouette is normal in size. Normal mediastinal and hilar contours.  Bony thorax is demineralized but grossly intact.  IMPRESSION: New right lung base consolidation consistent with pneumonia in the proper clinical setting.    Electronically Signed   By: Lajean Manes M.D.   On: 07/06/2014 10:31     ASSESSMENT / PLAN:  PULMONARY  A: Smoker with cxr evidence of undiagnosed copd Recurrent/ progressive RLL PNA sine May 2015. Concerned that this process has been going on since end of May. Etiology unclear: Resistant organism? Malignancy? endobronchial lesion?  Focal BOOP? [name]  Currently Rx as CAP Hemoptysis: ? PNA ? Endobronchial involvement  CT chest w/out clear mass or endobronchial involvement  P:   Scheduled BDs O2 as needed pulm hygiene  See ID section, need to f/u urine histo antigen F/u CXR Will decide on FOB based on culture data and CXR progression  CARDIOVASCULAR CVL right [place] CVL 8/6>>>  A:  Severe sepsis/septic shock: weaning pressors.  P:   Change BP goal to SBP > 100 See ID section  Keep euvolemic   RENAL A:   Hyponatremia: Improved w/ saline resuscitation  P:   F/u am chemistry    GASTROINTESTINAL A:   constipation  GERD P:   ppi Cont LOC & Stool softness  Adv diet   HEMATOLOGIC A:  Anemia  No evidence of bleeding other than intermittent hemoptysis  P:  Trend CBC Cont SCD thru 8/7, if hemoptysis improves could start [place] heparin 8/8  INFECTIOUS A:   Pneumonia (recurrent). Concerned about either resistant or post-obstructive process.  R/o UTI  P:   BCx2 8/6>>> UC 8/6>>> Sputum 8/6: rare GPC, GPR, abundant WBC>>> Abx: vanc, start date 8/6, day 1/X Abx: levaquin, start date 8/6, day 1/X Abx: azactam, start date 8/6, day 1/X U strep 8/6>>> neg  U legionella 8/6>>>neg   ENDOCRINE A:   No active issues  P:   Ck am glucose    NEUROLOGIC A:   Acute encephalopathy Gait disturbance  H/o depression  P:   RASS goal: 0 Supportive care PRN analgesia   TODAY'S SUMMARY:  Looking better, she feels a little better, cough is her major complaint. Working on weaning pressors, suspect that we will be able to d/c them today. Urine strep and legionella antigens are  negative. Histo still pending. Will get repeat CXR. Mobilize. Could move out of ICU tonight if bed needed and stays off pressors.   Marni Griffon ACNP Pulmonary and [place] Pager: [phone]  07/07/2014, 1:25 PM  Critical Care Time : 35 mins Patient seen and examined with NP P. Kary Kos, agree with his assessment and treatment plan, with the following exceptions 1. Stop levaquin - got adequate treatment for atypicals as outpatient.   Vilinda Boehringer, MD [facility] Pager [phone] On Call Pager [phone]  07/07/2014 1:25 PM

## 2014-07-07 NOTE — Progress Notes (Signed)
[facility] ADULT ICU REPLACEMENT PROTOCOL FOR AM LAB REPLACEMENT ONLY  The patient does apply for the [facility] Adult ICU Electrolyte Replacment Protocol based on the criteria listed below:   1. Is GFR >/= 40 ml/min? Yes.    Patient's GFR today is >90 2. Is urine output >/= 0.5 ml/kg/hr for the last 6 hours? Yes.   Patient's UOP is 3.9 ml/kg/hr 3. Is BUN < 60 mg/dL? Yes.    Patient's BUN today is 5 4. Abnormal electrolyte  K 3.7 5. Ordered repletion with: per protocol 6. If a panic level lab has been reported, has the CCM MD in charge been notified? Yes.  .   Physician:  Dr Vedia Pereyra, [place]ary Brim 07/07/2014 6:45 AM

## 2014-07-08 ENCOUNTER — Inpatient Hospital Stay (HOSPITAL_COMMUNITY): Payer: Managed Care, Other (non HMO)

## 2014-07-08 DIAGNOSIS — Z8701 Personal history of pneumonia (recurrent): Secondary | ICD-10-CM | POA: Diagnosis present

## 2014-07-08 LAB — CBC
HCT: 32.1 % — ABNORMAL LOW (ref 36.0–46.0)
Hemoglobin: 11 g/dL — ABNORMAL LOW (ref 12.0–15.0)
MCH: 31.1 pg (ref 26.0–34.0)
MCHC: 34.3 g/dL (ref 30.0–36.0)
MCV: 90.7 fL (ref 78.0–100.0)
PLATELETS: 191 10*3/uL (ref 150–400)
RBC: 3.54 MIL/uL — ABNORMAL LOW (ref 3.87–5.11)
RDW: 13.8 % (ref 11.5–15.5)
WBC: 10.6 10*3/uL — ABNORMAL HIGH (ref 4.0–10.5)

## 2014-07-08 LAB — COMPREHENSIVE METABOLIC PANEL
ALBUMIN: 1.6 g/dL — AB (ref 3.5–5.2)
ALT: 17 U/L (ref 0–35)
AST: 26 U/L (ref 0–37)
Alkaline Phosphatase: 97 U/L (ref 39–117)
Anion gap: 10 (ref 5–15)
BUN: 4 mg/dL — ABNORMAL LOW (ref 6–23)
CALCIUM: 7.9 mg/dL — AB (ref 8.4–10.5)
CHLORIDE: 92 meq/L — AB (ref 96–112)
CO2: 27 mEq/L (ref 19–32)
CREATININE: 0.39 mg/dL — AB (ref 0.50–1.10)
GFR calc Af Amer: >90 mL/min (ref >90)
GFR calc non Af Amer: >90 mL/min (ref >90)
Glucose, Bld: 84 mg/dL (ref 70–99)
Potassium: 3.8 mEq/L (ref 3.7–5.3)
Sodium: 129 mEq/L — ABNORMAL LOW (ref 137–147)
TOTAL PROTEIN: 5.6 g/dL — AB (ref 6.0–8.3)
Total Bilirubin: 0.4 mg/dL (ref 0.3–1.2)

## 2014-07-08 LAB — GLUCOSE, CAPILLARY
GLUCOSE-CAPILLARY: 83 mg/dL (ref 70–99)
GLUCOSE-CAPILLARY: 78 mg/dL (ref 70–99)

## 2014-07-08 MED ORDER — PANTOPRAZOLE SODIUM 40 MG PO TBEC
40.0000 mg | DELAYED_RELEASE_TABLET | Freq: Every day | ORAL | Status: DC
  Administered 2014-07-09 – 2014-07-14 (×6): 40 mg via ORAL
  Filled 2014-07-08 (×6): qty 1

## 2014-07-08 NOTE — Progress Notes (Signed)
Pt tranferred to [id number] at this time.  No s/s of any acute distress or c/o pain.  Foley dc'd per protocol prior to transfer and CVC dc'd.  Report given to [facility] RN.

## 2014-07-08 NOTE — Progress Notes (Signed)
PULMONARY / CRITICAL CARE MEDICINE   Name: Catherine Kelley MRN: [id number] DOB: 01/11/1951    ADMISSION DATE:  07/06/2014 CONSULTATION DATE:  8/6  REFERRING MD :  EDP   CHIEF COMPLAINT:  PNA/ AMS/ hypotension   INITIAL PRESENTATION:  63 year old female presented 8/6 w/ working dx of recurrent RLL pneumonia, newly complicated by acute encephalopathy and shock not responding to IVFs. PCCM asked to admit.     STUDIES:  CT chest 8/6>>> RLL consolidation CT head 8/6>>>neg   SIGNIFICANT EVENTS: 8/7 feeling better.  8/8 off pressors x 24 hours , no O2 with 94% sats  SUBJECTIVE:  Not in acute distress  VITAL SIGNS: Temp:  [98.2 F (36.8 C)-99.6 F (37.6 C)] 99.6 F (37.6 C) (08/08 0800) Pulse Rate:  [80-96] 96 (08/08 1100) Resp:  [18-34] 28 (08/08 1100) BP: (99-135)/(45-83) 108/49 mmHg (08/08 1100) SpO2:  [91 %-99 %] 91 % (08/08 1100) Weight:  [149 lb 7.6 oz (67.8 kg)] 149 lb 7.6 oz (67.8 kg) (08/08 0352) 1 liter  INTAKE / OUTPUT:  Intake/Output Summary (Last 24 hours) at 07/08/14 1104 Last data filed at 07/08/14 1100  Gross per 24 hour  Intake 2930.22 ml  Output   4205 ml  Net -1274.78 ml    PHYSICAL EXAMINATION: General:  Chronically ill appearing white female, not in acute distress.  Neuro:  Awake, oriented, no focal def  HEENT:  [place], no JVD, poor dentition, MM dry  Cardiovascular:  rrr hsr Lungs:  occ rhonchi, congested cough  Abdomen:  Soft, non-tender  Musculoskeletal:  Intact  Skin:  Intact   LABS:  PULMONARY  Recent Labs Lab 07/06/14 1116 07/06/14 1548 07/07/14 0255  PHART 7.473*  --  7.500*  PCO2ART 37.9  --  31.7*  PO2ART 59.0*  --  81.6  HCO3 27.7*  --  24.5*  TCO2 29  --  25.5  O2SAT 92.0 62.5 97.4    CBC  Recent Labs Lab 07/06/14 1040 07/07/14 0455 07/08/14 0345  HGB 11.8* 10.6* 11.0*  HCT 33.7* 31.3* 32.1*  WBC 14.5* 13.1* 10.6*  PLT 205 212 191    COAGULATION  Recent Labs Lab 07/06/14 1312  INR 1.42    CHEMISTRY  Recent  Labs Lab 07/06/14 1040 07/06/14 1800 07/07/14 0455 07/08/14 0345  NA 119* 132* 127* 129*  K 2.9* 5.2 3.7 3.8  CL 78* 99 94* 92*  CO2 27 25 24 27   GLUCOSE 98 116* 94 84  BUN 19 10 5* 4*  CREATININE 0.63 0.45* 0.44* 0.39*  CALCIUM 8.3* 7.6* 7.5* 7.9*   Estimated Creatinine Clearance: 64.8 ml/min (by C-G formula based on Cr of 0.39).   LIVER  Recent Labs Lab 07/06/14 1040 07/06/14 1312 07/08/14 0345  AST 30  --  26  ALT 17  --  17  ALKPHOS 120*  --  97  BILITOT 0.5  --  0.4  PROT 7.0  --  5.6*  ALBUMIN 2.3*  --  1.6*  INR  --  1.42  --      INFECTIOUS  Recent Labs Lab 07/06/14 1107 07/06/14 1312 07/06/14 1453  LATICACIDVEN 1.12 1.2 0.8     ENDOCRINE CBG (last 3)   Recent Labs  07/07/14 2321 07/08/14 0355 07/08/14 0826  GLUCAP 81 83 78    Imaging Dg Chest Port 1 View  07/07/2014   CLINICAL DATA:  Persistent cough, minus with  EXAM: PORTABLE CHEST - 1 VIEW  COMPARISON:  Radiograph 07/06/2014  FINDINGS: Normal cardiac  silhouette. Right PICC line in place. Right lower lobe consolidation again noted. There is a right pleural effusion mildly increased. No pulmonary edema. No pneumothorax.  IMPRESSION: No change in right lower lobe consolidation.  Interval increase in right pleural effusion.   Electronically Signed   By: Suzy Bouchard M.D.   On: 07/07/2014 14:11    8/8 CxR : [place] from 8/7   ASSESSMENT / PLAN:  PULMONARY  A: Smoker with cxr evidence of undiagnosed copd Recurrent/ progressive RLL PNA sine May 2015. Concerned that this process has been going on since end of May. Etiology unclear: Resistant organism? Malignancy? endobronchial lesion?  Focal BOOP? [name]  Currently Rx as CAP Hemoptysis: ? PNA ? Endobronchial involvement  CT chest w/out clear mass or endobronchial involvement  P:   Scheduled BDs O2 as needed pulm hygiene  See ID section, need to f/u urine histo antigen F/u CXR Will decide on FOB based on culture data and CXR  progression  CARDIOVASCULAR CVL right  CVL 8/6>>>  A:  Severe sepsis/septic shock: off pressors.  P:  Change BP goal to SBP > 100 See ID section  Keep euvolemic   RENAL A:   Hyponatremia: Improved w/ saline resuscitation  P:   F/u am chemistry    GASTROINTESTINAL A:   constipation  GERD P:   ppi Cont LOC & Stool softness  Adv diet   HEMATOLOGIC A:  Anemia  No evidence of bleeding other than intermittent hemoptysis  P:  Trend CBC Cont SCD thru 8/8 , no hemoptysis 8/8 INFECTIOUS A:   Pneumonia (recurrent). Concerned about either resistant or post-obstructive process.  R/o UTI  P:   BCx2 8/6>>> UC 8/6>>>neg Sputum 8/6: rare GPC, GPR, abundant WBC>>> Abx: vanc, start date 8/6, day 1/X Abx: levaquin, start date 8/6, day 2/X Abx: azactam, start date 8/6, day 2/X U strep 8/6>>> neg  U legionella 8/6>>>neg  Narrow abx based on culture data when available.  ENDOCRINE A:   No active issues P:   Ck am glucose   NEUROLOGIC A:   Acute encephalopathy(resolved) Gait disturbance  H/o depression  P:   RASS goal: 0 Supportive care PRN analgesia   TODAY'S SUMMARY:  Looking better, off pressors, no O2. Continues to have cough, no further hemoptysis. Transfer to floor.  Richardson Landry Minor ACNP Maryanna Shape PCCM Pager [phone] till 3 pm If no answer page [phone] 07/08/2014, 11:13 AM   Reviewed above, examined.  Shock resolved.  Respiratory status improving.  Continue current Abx.  Updated husband at bedside.  Chesley Mires, MD [facility] Pulmonary/Critical Care 07/08/2014, 12:54 PM Pager:  [phone] After 3pm call: [phone]

## 2014-07-09 DIAGNOSIS — E871 Hypo-osmolality and hyponatremia: Secondary | ICD-10-CM

## 2014-07-09 DIAGNOSIS — J9601 Acute respiratory failure with hypoxia: Secondary | ICD-10-CM | POA: Diagnosis present

## 2014-07-09 DIAGNOSIS — J9600 Acute respiratory failure, unspecified whether with hypoxia or hypercapnia: Secondary | ICD-10-CM

## 2014-07-09 LAB — CBC
HCT: 35.4 % — ABNORMAL LOW (ref 36.0–46.0)
Hemoglobin: 11.8 g/dL — ABNORMAL LOW (ref 12.0–15.0)
MCH: 30.3 pg (ref 26.0–34.0)
MCHC: 33.3 g/dL (ref 30.0–36.0)
MCV: 90.8 fL (ref 78.0–100.0)
PLATELETS: 212 10*3/uL (ref 150–400)
RBC: 3.9 MIL/uL (ref 3.87–5.11)
RDW: 13.9 % (ref 11.5–15.5)
WBC: 10.3 10*3/uL (ref 4.0–10.5)

## 2014-07-09 LAB — CULTURE, RESPIRATORY

## 2014-07-09 LAB — CULTURE, RESPIRATORY W GRAM STAIN: Culture: NORMAL

## 2014-07-09 LAB — BASIC METABOLIC PANEL
Anion gap: 11 (ref 5–15)
BUN: 5 mg/dL — ABNORMAL LOW (ref 6–23)
CHLORIDE: 87 meq/L — AB (ref 96–112)
CO2: 29 mEq/L (ref 19–32)
CREATININE: 0.37 mg/dL — AB (ref 0.50–1.10)
Calcium: 8.2 mg/dL — ABNORMAL LOW (ref 8.4–10.5)
Glucose, Bld: 95 mg/dL (ref 70–99)
POTASSIUM: 3.3 meq/L — AB (ref 3.7–5.3)
Sodium: 127 mEq/L — ABNORMAL LOW (ref 137–147)

## 2014-07-09 LAB — EXPECTORATED SPUTUM ASSESSMENT W REFEX TO RESP CULTURE

## 2014-07-09 LAB — PHOSPHORUS: Phosphorus: 3.6 mg/dL (ref 2.3–4.6)

## 2014-07-09 LAB — MAGNESIUM: MAGNESIUM: 1.3 mg/dL — AB (ref 1.5–2.5)

## 2014-07-09 MED ORDER — POTASSIUM CHLORIDE CRYS ER 20 MEQ PO TBCR
40.0000 meq | EXTENDED_RELEASE_TABLET | Freq: Once | ORAL | Status: AC
  Administered 2014-07-09: 40 meq via ORAL
  Filled 2014-07-09: qty 2

## 2014-07-09 NOTE — Progress Notes (Signed)
ANTIBIOTIC CONSULT NOTE - F/u  Pharmacy Consult for aztreonam and vancomycin Indication: rule out sepsis  Allergies  Allergen Reactions  . Augmentin [Amoxicillin-Pot Clavulanate]   . Ceclor [Cefaclor]     hives  . Escitalopram Oxalate     Pt does not recall ever taking medication  . Penicillins     rash  . Sertraline Hcl   . Sulfa Drugs Cross Reactors     Hives and rash    Patient Measurements: Height: 5\' 5"  (165.1 cm) Weight: 149 lb 7.6 oz (67.8 kg) IBW/kg (Calculated) : 57   Vital Signs: Temp: 99.3 F (37.4 C) (08/09 0521) Temp src: Oral (08/09 0521) BP: 138/77 mmHg (08/09 0521) Pulse Rate: 107 (08/09 0521) Intake/Output from previous day: 08/08 0701 - 08/09 0700 In: 9518 [P.O.:840; I.V.:375; IV Piggyback:200] Out: 875 [Urine:875] Intake/Output from this shift: Total I/O In: 120 [P.O.:120] Out: -   Labs:  Recent Labs  07/06/14 1509  07/07/14 0455 07/08/14 0345 07/09/14 0456  WBC  --   --  13.1* 10.6* 10.3  HGB  --   --  10.6* 11.0* 11.8*  PLT  --   --  212 191 212  LABCREA 37.17  --   --   --   --   CREATININE  --   < > 0.44* 0.39* 0.37*  < > = values in this interval not displayed. Estimated Creatinine Clearance: 64.8 ml/min (by C-G formula based on Cr of 0.37). No results found for this basename: VANCOTROUGH, VANCOPEAK, VANCORANDOM, [place], [place], GENTRANDOM, TOBRATROUGH, TOBRAPEAK, TOBRARND, AMIKACINPEAK, AMIKACINTROU, AMIKACIN,  in the last 72 hours   Microbiology: Recent Results (from the past 720 hour(s))  CULTURE, BLOOD (ROUTINE X 2)     Status: None   Collection Time    07/06/14 10:40 AM      Result Value Ref Range Status   Specimen Description BLOOD RIGHT FOREARM   Final   Special Requests BOTTLES DRAWN AEROBIC AND ANAEROBIC 5CC   Final   Culture  Setup Time     Final   Value: 07/06/2014 16:03     Performed at [facility]   Culture     Final   Value:        BLOOD CULTURE RECEIVED NO GROWTH TO DATE CULTURE WILL BE HELD FOR  5 DAYS BEFORE ISSUING A FINAL NEGATIVE REPORT     Performed at [facility]   Report Status PENDING   Incomplete  CULTURE, BLOOD (ROUTINE X 2)     Status: None   Collection Time    07/06/14 11:03 AM      Result Value Ref Range Status   Specimen Description BLOOD LEFT ARM   Final   Special Requests BOTTLES DRAWN AEROBIC AND ANAEROBIC 5CC   Final   Culture  Setup Time     Final   Value: 07/06/2014 16:03     Performed at [facility]   Culture     Final   Value:        BLOOD CULTURE RECEIVED NO GROWTH TO DATE CULTURE WILL BE HELD FOR 5 DAYS BEFORE ISSUING A FINAL NEGATIVE REPORT     Performed at [facility]   Report Status PENDING   Incomplete  URINE CULTURE     Status: None   Collection Time    07/06/14  3:09 PM      Result Value Ref Range Status   Specimen Description URINE, CATHETERIZED   Final   Special Requests NONE  Final   Culture  Setup Time     Final   Value: 07/06/2014 18:18     Performed at [place]     Final   Value: NO GROWTH     Performed at [facility]   Culture     Final   Value: NO GROWTH     Performed at [facility]   Report Status 07/07/2014 FINAL   Final  MRSA PCR SCREENING     Status: None   Collection Time    07/06/14  3:33 PM      Result Value Ref Range Status   MRSA by PCR NEGATIVE  NEGATIVE Final   Comment:            The GeneXpert MRSA Assay (FDA     approved for NASAL specimens     only), is one component of a     comprehensive MRSA colonization     surveillance program. It is not     intended to diagnose MRSA     infection nor to guide or     monitor treatment for     MRSA infections.  CULTURE, EXPECTORATED SPUTUM-ASSESSMENT     Status: None   Collection Time    07/06/14  4:40 PM      Result Value Ref Range Status   Specimen Description SPUTUM   Final   Special Requests NONE   Final   Sputum evaluation     Final   Value: THIS SPECIMEN IS ACCEPTABLE. RESPIRATORY CULTURE  REPORT TO FOLLOW.   Report Status 07/06/2014 FINAL   Final  CULTURE, RESPIRATORY (NON-EXPECTORATED)     Status: None   Collection Time    07/06/14  4:40 PM      Result Value Ref Range Status   Specimen Description SPUTUM   Final   Special Requests NONE   Final   Gram Stain     Final   Value: ABUNDANT WBC PRESENT,BOTH PMN AND MONONUCLEAR     MODERATE SQUAMOUS EPITHELIAL CELLS PRESENT     RARE GRAM POSITIVE COCCI IN PAIRS     IN CLUSTERS RARE GRAM POSITIVE RODS     Performed at [facility]   Culture     Final   Value: NORMAL OROPHARYNGEAL FLORA     Performed at [facility]   Report Status 07/09/2014 FINAL   Final    Medical History: Past Medical History  Diagnosis Date  . Hypertension     essential hypertension  . Tobacco abuse     smoking about 1/2 pk of cigarettes a day  . Anxiety   . Depression     psychiatrist Dr. Toy Care  . Respiratory infection   . Shortness of breath   . Malaise   . Lightheadedness   . Emphysematous COPD     changes in right base along with patchy areas on last x-ray  . COPD (chronic obstructive pulmonary disease)   . History of echocardiogram 12/24/2006    Est. EF of 35-70% NORMAL LV SYSTOLIC FUNCTION WITH IMPAIRED RELAXATION -- MILD AORTIC SCLEROSIS -- NORMAL PALONARY ARTERY PRESSURE -- NO OLD ECHOS FOR COMPARISON -- [name], MD  . History of cardiovascular stress test 08/15/2004    EF of 70% -- Normal stress cardiolite.  There is no evidence of ischemia and there is normal LV function. -- [name]. MD  . Diastolic dysfunction   . Allergy   . Asthma   .  Heart murmur   . Pneumonia     "several times since May 2015" (07/06/2014)  . Chronic bronchitis     "get it alot; maybe not q yr" (07/06/2014)    Assessment: 62 YOF continuing on aztreonam and vancomycin for potential RLL pna/septic shock. Pt was treated for PNA recently (prev tx- 2 rds levo, azithro, most recently doxy completed 7/19). Afeb, wbc trending down  wnl. SCr 0.37 stable, CrCl~65.  Aztr 8/6>> Vanc 8/6>> Levo 8/6>>8/7  BCx2 8/6>>ngtd UC 8/6>> NG Sputum 8/6>>nf U strep 8/6>>neg U leg 8/6>>neg  Goal of Therapy:  Vancomycin trough level 15-20 mcg/ml  Plan:  - Cont Vancomycin 750mg  IV q12h - F/u VT 8/10 if not d/c'd - Cont Aztreonam 2g IV q8h - F/u renal function, c/s, clinical progress, abx de-escalation  Elicia Lamp, PharmD Clinical Pharmacist - Resident Pager [phone] 07/09/2014 12:00 PM

## 2014-07-09 NOTE — Progress Notes (Signed)
PULMONARY / CRITICAL CARE MEDICINE   Name: Catherine Kelley MRN: [id number] DOB: Nov 09, 1951    ADMISSION DATE:  07/06/2014 CONSULTATION DATE:  8/6  REFERRING MD :  EDP   CHIEF COMPLAINT:  PNA/ AMS/ hypotension   INITIAL PRESENTATION:  63 year old female active smoker  presented 8/6 w/ working dx of recurrent RLL pneumonia, newly complicated by acute encephalopathy and shock not responding to IVFs. PCCM asked to admit.     STUDIES:  CT chest 8/6> RLL consolidation CT head 8/6> neg   SIGNIFICANT EVENTS:   SUBJECTIVE:  Sob with minimal activity, mostly staying in bed, no cough or cp    VITAL SIGNS: Temp:  [98 F (36.7 C)-99.3 F (37.4 C)] 99.3 F (37.4 C) (08/09 0521) Pulse Rate:  [102-107] 107 (08/09 0521) Resp:  [18-22] 20 (08/09 0521) BP: (136-155)/(68-81) 138/77 mmHg (08/09 0521) SpO2:  [88 %-97 %] 92 % (08/09 0556) 02 Rx  2lpm  INTAKE / OUTPUT:  Intake/Output Summary (Last 24 hours) at 07/09/14 1250 Last data filed at 07/09/14 0800  Gross per 24 hour  Intake    360 ml  Output      0 ml  Net    360 ml    PHYSICAL EXAMINATION: General:  Chronically ill appearing white female, not in acute distress >> stated age able to lie flat in bed, mod congested sounding cough  Neuro:  Awake, oriented, no focal def  HEENT:  [place], no JVD, poor dentition, MM dry  Cardiovascular:  rrr hsr Lungs:  occ rhonchi   Abdomen:  Soft, non-tender  Musculoskeletal:  Intact  Skin:  Intact   LABS:  PULMONARY  Recent Labs Lab 07/06/14 1116 07/06/14 1548 07/07/14 0255  PHART 7.473*  --  7.500*  PCO2ART 37.9  --  31.7*  PO2ART 59.0*  --  81.6  HCO3 27.7*  --  24.5*  TCO2 29  --  25.5  O2SAT 92.0 62.5 97.4    CBC  Recent Labs Lab 07/07/14 0455 07/08/14 0345 07/09/14 0456  HGB 10.6* 11.0* 11.8*  HCT 31.3* 32.1* 35.4*  WBC 13.1* 10.6* 10.3  PLT 212 191 212    COAGULATION  Recent Labs Lab 07/06/14 1312  INR 1.42    CHEMISTRY  Recent Labs Lab 07/06/14 1040  07/06/14 1800 07/07/14 0455 07/08/14 0345 07/09/14 0456  NA 119* 132* 127* 129* 127*  K 2.9* 5.2 3.7 3.8 3.3*  CL 78* 99 94* 92* 87*  CO2 27 25 24 27 29   GLUCOSE 98 116* 94 84 95  BUN 19 10 5* 4* 5*  CREATININE 0.63 0.45* 0.44* 0.39* 0.37*  CALCIUM 8.3* 7.6* 7.5* 7.9* 8.2*  MG  --   --   --   --  1.3*  PHOS  --   --   --   --  3.6   Estimated Creatinine Clearance: 64.8 ml/min (by C-G formula based on Cr of 0.37).   LIVER  Recent Labs Lab 07/06/14 1040 07/06/14 1312 07/08/14 0345  AST 30  --  26  ALT 17  --  17  ALKPHOS 120*  --  97  BILITOT 0.5  --  0.4  PROT 7.0  --  5.6*  ALBUMIN 2.3*  --  1.6*  INR  --  1.42  --      INFECTIOUS  Recent Labs Lab 07/06/14 1107 07/06/14 1312 07/06/14 1453  LATICACIDVEN 1.12 1.2 0.8     ENDOCRINE CBG (last 3)   Recent Labs  07/07/14  2321 07/08/14 0355 07/08/14 0826  GLUCAP 81 83 78    Imaging Dg Chest Port 1 View  07/08/2014   CLINICAL DATA:  Endotracheal tube.  EXAM: PORTABLE CHEST - 1 VIEW  COMPARISON:  One-view chest 07/07/2014  FINDINGS: The heart size is normal. A right-sided PICC line is stable in position. The patient is not intubated. A right pleural effusion is similar to the prior study. Asymmetric right sided interstitial disease likely reflects edema. More mild interstitial disease is present on the left. Right basilar airspace disease likely reflects atelectasis.  IMPRESSION: 1. Stable to slightly improved asymmetric right-sided edema and pleural effusion. 2. Stable right-sided PICC line. 3. There is no endotracheal tube. 4. Right greater than left bibasilar airspace disease likely reflects atelectasis. Infection is not excluded.   Electronically Signed   By: Lawrence Santiago M.D.   On: 07/08/2014 07:22    8/8 CxR : [place] from 8/7   ASSESSMENT / PLAN:  PULMONARY  A: Smoker with cxr evidence of undiagnosed copd Recurrent/ progressive RLL PNA sine May 2015. Concerned that this process has been going on  since end of May. Etiology unclear: Resistant organism? Malignancy? endobronchial lesion?  Focal BOOP? [name]  Currently Rx as CAP  Hemoptysis: ? PNA ? Endobronchial involvement  CT chest w/out clear mass or endobronchial involvement  P:   Scheduled BDs O2 adjust to sats over 90% pulm hygiene  See ID section, need to f/u urine histo antigen F/u CXR Will decide on FOB based on culture data and CXR progression  CARDIOVASCULAR CVL right  CVL 8/6>>>  A:  Severe sepsis/septic shock: off pressors.  P:   See ID section  Keep euvolemic   RENAL A:   Hyponatremia: Improved w/ saline resuscitation  P:   F/u am chemistry    GASTROINTESTINAL A:   constipation  GERD P:   ppi Cont LOC & Stool softness  Adv diet   HEMATOLOGIC A:  Anemia  No evidence of bleeding other than intermittent hemoptysis  P:  Trend CBC     INFECTIOUS A:   Pneumonia (recurrent). Concerned about either resistant or post-obstructive process.  R/o UTI  P:   BCx2 8/6>>> UC 8/6>>>neg Sputum 8/6: rare GPC, GPR, abundant WBC> Neg  Abx: vanc, start date 8/6  Abx: levaquin, start date 8/6 - 8/7  Abx: azactam, start date 8/6,  U strep 8/6>>> neg  U legionella 8/6>>>neg  Narrow abx based on culture data when available.  ENDOCRINE A:   No active issues P:   Ck am glucose   NEUROLOGIC A:   Acute encephalopathy(resolved) Gait disturbance  H/o depression  P:   RASS goal: 0 Supportive care PRN analgesia   TODAY'S SUMMARY:  Looking better, off pressors, no O2. Continues to have cough, no further hemoptysis. Transfer to floor.  Richardson Landry Minor ACNP Maryanna Shape PCCM Pager [phone] till 3 pm If no answer page [phone] 07/09/2014, 12:50 PM    .  Updated husband at bedside. Patient doing ok on floor but very debilitated / mostly bed bound> need to work on mobilization    Christinia Gully, MD Pulmonary and [place] [phone] After 5:30 PM or weekends, call [phone]

## 2014-07-10 ENCOUNTER — Inpatient Hospital Stay (HOSPITAL_COMMUNITY): Payer: Managed Care, Other (non HMO)

## 2014-07-10 DIAGNOSIS — J40 Bronchitis, not specified as acute or chronic: Secondary | ICD-10-CM

## 2014-07-10 LAB — BASIC METABOLIC PANEL
Anion gap: 12 (ref 5–15)
BUN: 6 mg/dL (ref 6–23)
CALCIUM: 8.2 mg/dL — AB (ref 8.4–10.5)
CO2: 30 mEq/L (ref 19–32)
Chloride: 88 mEq/L — ABNORMAL LOW (ref 96–112)
Creatinine, Ser: 0.32 mg/dL — ABNORMAL LOW (ref 0.50–1.10)
GFR calc Af Amer: >90 mL/min (ref >90)
GLUCOSE: 109 mg/dL — AB (ref 70–99)
Potassium: 3.3 mEq/L — ABNORMAL LOW (ref 3.7–5.3)
Sodium: 130 mEq/L — ABNORMAL LOW (ref 137–147)

## 2014-07-10 LAB — HISTOPLASMA ANTIGEN, URINE: Histoplasma Antigen, urine: <0.5 ng/mL

## 2014-07-10 LAB — VANCOMYCIN, TROUGH: Vancomycin Tr: <5 ug/mL — ABNORMAL LOW (ref 10.0–20.0)

## 2014-07-10 LAB — PRO B NATRIURETIC PEPTIDE: Pro B Natriuretic peptide (BNP): 568.1 pg/mL — ABNORMAL HIGH (ref 0–125)

## 2014-07-10 MED ORDER — SODIUM CHLORIDE 0.9 % IV SOLN
250.0000 mg | Freq: Four times a day (QID) | INTRAVENOUS | Status: DC
  Administered 2014-07-10 – 2014-07-14 (×17): 250 mg via INTRAVENOUS
  Filled 2014-07-10 (×20): qty 250

## 2014-07-10 MED ORDER — ALPRAZOLAM 0.5 MG PO TABS
0.5000 mg | ORAL_TABLET | Freq: Two times a day (BID) | ORAL | Status: DC | PRN
  Administered 2014-07-11 – 2014-07-12 (×2): 0.5 mg via ORAL
  Filled 2014-07-10 (×2): qty 1

## 2014-07-10 MED ORDER — VANCOMYCIN HCL IN DEXTROSE 1-5 GM/200ML-% IV SOLN
1000.0000 mg | Freq: Three times a day (TID) | INTRAVENOUS | Status: DC
  Administered 2014-07-11 (×2): 1000 mg via INTRAVENOUS
  Filled 2014-07-10 (×3): qty 200

## 2014-07-10 MED ORDER — SODIUM CHLORIDE 0.9 % IV SOLN
1000.0000 mL | INTRAVENOUS | Status: DC
  Administered 2014-07-10 – 2014-07-11 (×2): 1000 mL via INTRAVENOUS

## 2014-07-10 NOTE — Evaluation (Signed)
Occupational Therapy Evaluation Patient Details Name: Catherine Kelley MRN: [id number] DOB: Mar 25, 1951 Today's Date: 07/10/2014    History of Present Illness 63 yo female admitted with AMS and found to have CAP   Clinical Impression   This 63 yo female admitted and found to have above presents to acute OT with decreased endurance, drop in O2 sats with activity, decreased balance all affecting pt's ability to care for herself and for her husband to be able to take care of her. She will benefit from acute OT with follow up at SNF to get back to an independent level.    Follow Up Recommendations  SNF    Equipment Recommendations  3 in 1 bedside comode       Precautions / Restrictions Precautions Precautions: Fall Precaution Comments: sats drop into 80's with off O2 and with activity Restrictions Weight Bearing Restrictions: No      Mobility Bed Mobility Overal bed mobility: Needs Assistance Bed Mobility: Sit to Supine       Sit to supine: Min assist      Transfers Overall transfer level: Needs assistance Equipment used: Rolling walker (2 wheeled) Transfers: Sit to/from Stand Sit to Stand: Min assist         General transfer comment: VCs for safe hand placement and safe use of RW (tended to not walk in it)    Balance Overall balance assessment: Needs assistance Sitting-balance support: No upper extremity supported;Feet supported Sitting balance-Leahy Scale: Fair     Standing balance support: Bilateral upper extremity supported Standing balance-Leahy Scale: Fair                              ADL Overall ADL's : Needs assistance/impaired Eating/Feeding: Independent;Sitting   Grooming: Set up;Sitting   Upper Body Bathing: Set up;Sitting   Lower Body Bathing: Maximal assistance;Sit to/from stand   Upper Body Dressing : Minimal assistance;Sitting   Lower Body Dressing: Total assistance;Sit to/from stand   Toilet Transfer: Minimal  assistance;Ambulation;RW;Comfort height toilet;Grab bars   Toileting- Clothing Manipulation and Hygiene: Min guard;Sit to/from stand         General ADL Comments: Educated pt on purse lipped breathing due to pt gets winded with activity and sats dropped as low as 85% upon getting from toilet to the bed               Pertinent Vitals/Pain Pain Assessment:  (In chest when she coughs, I did not ask her to rate)     Hand Dominance Right   Extremity/Trunk Assessment Upper Extremity Assessment Upper Extremity Assessment: Overall WFL for tasks assessed           Communication Communication Communication: No difficulties   Cognition Arousal/Alertness: Awake/alert Behavior During Therapy: WFL for tasks assessed/performed         Memory: Decreased short-term memory (Could tell me the month, year, place--but when I asked if she got herself up to the recliner, she said yes--however PT is the one who did it. When I asked again if someone in purple like me got her up, she said she could not remembe.)                        Home Living Family/patient expects to be discharged to:: Skilled nursing facility Living Arrangements: Spouse/significant other Available Help at Discharge: Family;Available PRN/intermittently Type of Home: House Home Access: Stairs to enter Entrance Stairs-Number of Steps: 5 Entrance  Stairs-Rails: Right;Left;Can reach both Home Layout: One level     Bathroom Shower/Tub: Walk-in shower;Door Shower/tub characteristics: Charity fundraiser: Standard     Home Equipment: Shower seat          Prior Functioning/Environment Level of Independence: Independent             OT Diagnosis: Generalized weakness;Acute pain   OT Problem List: Decreased strength;Decreased activity tolerance;Impaired balance (sitting and/or standing);Pain;Cardiopulmonary status limiting activity   OT Treatment/Interventions: Self-care/ADL training;Patient/family  education;Balance training;Energy conservation;DME and/or AE instruction    OT Goals(Current goals can be found in the care plan section) Acute Rehab OT Goals Patient Stated Goal: to hopefully be able to return to work OT Goal Formulation: With patient Time For Goal Achievement: 07/17/14 Potential to Achieve Goals: Good  OT Frequency: Min 2X/week   Barriers to D/C: Decreased caregiver support             End of Session Equipment Utilized During Treatment: Gait belt;Rolling walker  Activity Tolerance: Patient limited by fatigue Patient left: in bed;with call bell/phone within reach;with bed alarm set   Time: 9093-1121 OT Time Calculation (min): 41 min Charges:  OT General Charges $OT Visit: 1 Procedure OT Evaluation $Initial OT Evaluation Tier I: 1 Procedure OT Treatments $Self Care/Home Management : 23-37 mins   Almon Register [phone] 07/10/2014, 11:33 AM

## 2014-07-10 NOTE — Progress Notes (Signed)
ANTIBIOTIC CONSULT NOTE - F/u  Pharmacy Consult for imipenem and vancomycin Indication: pneumonia  Allergies  Allergen Reactions  . Augmentin [Amoxicillin-Pot Clavulanate]   . Ceclor [Cefaclor]     hives  . Escitalopram Oxalate     Pt does not recall ever taking medication  . Penicillins     rash  . Sertraline Hcl   . Sulfa Drugs Cross Reactors     Hives and rash    Patient Measurements: Height: 5\' 5"  (165.1 cm) Weight: 146 lb (66.225 kg) IBW/kg (Calculated) : 57   Vital Signs: Temp: 98.2 F (36.8 C) (08/10 1441) Temp src: Oral (08/10 1441) BP: 140/74 mmHg (08/10 1441) Pulse Rate: 104 (08/10 1441) Intake/Output from previous day: 08/09 0701 - 08/10 0700 In: 600 [P.O.:600] Out: -  Intake/Output from this shift: Total I/O In: 120 [P.O.:120] Out: -   Labs:  Recent Labs  07/08/14 0345 07/09/14 0456 07/10/14 1200  WBC 10.6* 10.3  --   HGB 11.0* 11.8*  --   PLT 191 212  --   CREATININE 0.39* 0.37* 0.32*   Estimated Creatinine Clearance: 64.8 ml/min (by C-G formula based on Cr of 0.32).  Recent Labs  07/10/14 1630  [place] <5.0*     Microbiology: Recent Results (from the past 720 hour(s))  CULTURE, BLOOD (ROUTINE X 2)     Status: None   Collection Time    07/06/14 10:40 AM      Result Value Ref Range Status   Specimen Description BLOOD RIGHT FOREARM   Final   Special Requests BOTTLES DRAWN AEROBIC AND ANAEROBIC 5CC   Final   Culture  Setup Time     Final   Value: 07/06/2014 16:03     Performed at [facility]   Culture     Final   Value:        BLOOD CULTURE RECEIVED NO GROWTH TO DATE CULTURE WILL BE HELD FOR 5 DAYS BEFORE ISSUING A FINAL NEGATIVE REPORT     Performed at [facility]   Report Status PENDING   Incomplete  CULTURE, BLOOD (ROUTINE X 2)     Status: None   Collection Time    07/06/14 11:03 AM      Result Value Ref Range Status   Specimen Description BLOOD LEFT ARM   Final   Special Requests BOTTLES DRAWN AEROBIC  AND ANAEROBIC 5CC   Final   Culture  Setup Time     Final   Value: 07/06/2014 16:03     Performed at [facility]   Culture     Final   Value:        BLOOD CULTURE RECEIVED NO GROWTH TO DATE CULTURE WILL BE HELD FOR 5 DAYS BEFORE ISSUING A FINAL NEGATIVE REPORT     Performed at [facility]   Report Status PENDING   Incomplete  URINE CULTURE     Status: None   Collection Time    07/06/14  3:09 PM      Result Value Ref Range Status   Specimen Description URINE, CATHETERIZED   Final   Special Requests NONE   Final   Culture  Setup Time     Final   Value: 07/06/2014 18:18     Performed at [facility]     Final   Value: NO GROWTH     Performed at [facility]   Culture     Final   Value: NO GROWTH  Performed at [facility]   Report Status 07/07/2014 FINAL   Final  MRSA PCR SCREENING     Status: None   Collection Time    07/06/14  3:33 PM      Result Value Ref Range Status   MRSA by PCR NEGATIVE  NEGATIVE Final   Comment:            The GeneXpert MRSA Assay (FDA     approved for NASAL specimens     only), is one component of a     comprehensive MRSA colonization     surveillance program. It is not     intended to diagnose MRSA     infection nor to guide or     monitor treatment for     MRSA infections.  CULTURE, EXPECTORATED SPUTUM-ASSESSMENT     Status: None   Collection Time    07/06/14  4:40 PM      Result Value Ref Range Status   Specimen Description SPUTUM   Final   Special Requests NONE   Final   Sputum evaluation     Final   Value: THIS SPECIMEN IS ACCEPTABLE. RESPIRATORY CULTURE REPORT TO FOLLOW.   Report Status 07/06/2014 FINAL   Final  CULTURE, RESPIRATORY (NON-EXPECTORATED)     Status: None   Collection Time    07/06/14  4:40 PM      Result Value Ref Range Status   Specimen Description SPUTUM   Final   Special Requests NONE   Final   Gram Stain     Final   Value: ABUNDANT WBC PRESENT,BOTH PMN AND  MONONUCLEAR     MODERATE SQUAMOUS EPITHELIAL CELLS PRESENT     RARE GRAM POSITIVE COCCI IN PAIRS     IN CLUSTERS RARE GRAM POSITIVE RODS     Performed at [facility]   Culture     Final   Value: NORMAL OROPHARYNGEAL FLORA     Performed at [facility]   Report Status 07/09/2014 FINAL   Final  CULTURE, EXPECTORATED SPUTUM-ASSESSMENT     Status: None   Collection Time    07/09/14  9:59 AM      Result Value Ref Range Status   Specimen Description SPUTUM   Final   Special Requests NONE   Final   Sputum evaluation     Final   Value: THIS SPECIMEN IS ACCEPTABLE. RESPIRATORY CULTURE REPORT TO FOLLOW.   Report Status 07/09/2014 FINAL   Final  CULTURE, RESPIRATORY (NON-EXPECTORATED)     Status: None   Collection Time    07/09/14  9:59 AM      Result Value Ref Range Status   Specimen Description SPUTUM   Final   Special Requests NONE   Final   Gram Stain     Final   Value: NO WBC SEEN     FEW SQUAMOUS EPITHELIAL CELLS PRESENT     MODERATE GRAM POSITIVE COCCI     IN PAIRS IN CLUSTERS IN CHAINS RARE GRAM POSITIVE RODS     Performed at [facility]   Culture     Final   Value: Culture reincubated for better growth     Performed at [facility]   Report Status PENDING   Incomplete    Assessment: 56 YOF continuing on imipenem and vancomycin for RLL pna/septic shock. Pt was treated for PNA recently (prev tx- 2 rounds levofloxacin, azithro, most recently doxy completed 7/19). She remains afebrile, WBC is now  normal. SCr has decreased in half from 0.6 to 0.3 since admission.  Respiratory culture obtained yesterday with moderate GPC in pairs and rare GPR. Respiratory culture from 8/6 with normal flora. Vancomycin trough drawn this evening on 750mg  IV q12h schedule was <10mcg/mL. Upon reviewing MAR, a dose was not missed, however a timing change appears to have occurred on 8/8 which resulted in a dose not being given for ~18 hours. However, this would not be the  only reason for such a low trough.  Goal of Therapy:  Vancomycin trough level 15-20 mcg/ml  Plan:  1. Increase vancomycin to 1000mg  IV q8h 2. Trough at new steady state 3. Continue imipenem-cilastin 250mg  IV q6h 4. Follow c/s, clinical progression, LOT and renal function  Kaylla Cobos D. Jasime Westergren, PharmD, BCPS Clinical Pharmacist Pager: [phone] 07/10/2014 5:37 PM

## 2014-07-10 NOTE — Evaluation (Signed)
Physical Therapy Evaluation Patient Details Name: Catherine Kelley MRN: [id number] DOB: 09/12/1951 Today's Date: 07/10/2014   History of Present Illness  63 yo female admitted with AMS and found to have CAP  Clinical Impression  Pt fatigues easily and needs 2L O2 to maintain sats >90% during mobility.  Pt with increased coughing after coming to sitting and with mobility.  RN aware.  Will continue to follow.      Follow Up Recommendations SNF    Equipment Recommendations  Rolling walker with 5" wheels    Recommendations for Other Services       Precautions / Restrictions Precautions Precautions: Fall Precaution Comments: Sats remained 90-93% on 2L O2.   Restrictions Weight Bearing Restrictions: No      Mobility  Bed Mobility Overal bed mobility: Needs Assistance Bed Mobility: Supine to Sit     Supine to sit: Min assist;HOB elevated Sit to supine: Min assist   General bed mobility comments: cues for encouragement and increased time needed.    Transfers Overall transfer level: Needs assistance Equipment used: Rolling walker (2 wheeled) Transfers: Sit to/from Stand Sit to Stand: Min assist         General transfer comment: cues for UE use and controlling descent to sitting.    Ambulation/Gait Ambulation/Gait assistance: Min guard Ambulation Distance (Feet): 15 Feet Assistive device: Rolling walker (2 wheeled) Gait Pattern/deviations: Step-through pattern;Decreased stride length;Trunk flexed     General Gait Details: pt moves slowly and needs encouragement.  pt's O2 sats remained 90-91% on 2L O2.    Stairs            Wheelchair Mobility    Modified Rankin (Stroke Patients Only)       Balance Overall balance assessment: Needs assistance Sitting-balance support: No upper extremity supported;Feet supported Sitting balance-Leahy Scale: Fair     Standing balance support: Bilateral upper extremity supported Standing balance-Leahy Scale: Poor                                Pertinent Vitals/Pain Pain Assessment: No/denies pain    Home Living Family/patient expects to be discharged to:: Skilled nursing facility Living Arrangements: Spouse/significant other Available Help at Discharge: Family;Available PRN/intermittently Type of Home: House Home Access: Stairs to enter Entrance Stairs-Rails: Right;Left;Can reach both Entrance Stairs-Number of Steps: 5 Home Layout: One level 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 Comments: 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    Prior Function Level of Independence: Independent               Journalist, newspaper   Dominant Hand: Right    Extremity/Trunk Assessment   Upper Extremity Assessment: Defer to OT evaluation           Lower Extremity Assessment: Generalized weakness      Cervical / Trunk Assessment: Kyphotic  Communication   Communication: No difficulties  Cognition Arousal/Alertness: Awake/alert Behavior During Therapy: WFL for tasks assessed/performed Overall Cognitive Status: Impaired/Different from baseline Area of Impairment: Memory     Memory: Decreased short-term memory              General Comments      Exercises        Assessment/Plan    PT Assessment Patient needs continued PT services  PT Diagnosis Difficulty walking;Generalized weakness   PT Problem List Decreased strength;Decreased activity tolerance;Decreased balance;Decreased mobility;Decreased coordination;Decreased cognition;Decreased knowledge of use of DME;Cardiopulmonary status limiting  activity  PT Treatment Interventions DME instruction;Gait training;Stair training;Functional mobility training;Therapeutic activities;Therapeutic exercise;Balance training;Patient/family education   PT Goals (Current goals can be found in the Care Plan section) Acute Rehab PT Goals Patient Stated Goal: to hopefully be able to return to work PT Goal Formulation: With  patient Time For Goal Achievement: 07/24/14 Potential to Achieve Goals: Good    Frequency Min 3X/week   Barriers to discharge        Co-evaluation               End of Session Equipment Utilized During Treatment: Gait belt;Oxygen Activity Tolerance: Patient limited by fatigue Patient left: in chair;with call bell/phone within reach Nurse Communication: Mobility status         Time: 0820-0856 PT Time Calculation (min): 36 min   Charges:   PT Evaluation $Initial PT Evaluation Tier I: 1 Procedure PT Treatments $Gait Training: 8-22 mins $Therapeutic Activity: 8-22 mins   PT G CodesCatarina Hartshorn, [place] 07/10/2014, 11:41 AM

## 2014-07-10 NOTE — Progress Notes (Signed)
PULMONARY / CRITICAL CARE MEDICINE   Name: Catherine Kelley MRN: [id number] DOB: May 24, 1951    ADMISSION DATE:  07/06/2014 CONSULTATION DATE:  8/6  REFERRING MD :  EDP   CHIEF COMPLAINT:  PNA/ AMS/ hypotension   INITIAL PRESENTATION:  63 year old female active smoker  presented 8/6 w/ working dx of recurrent RLL pneumonia, newly complicated by acute encephalopathy and shock not responding to IVFs. PCCM asked to admit.     STUDIES:  CT chest 8/6> RLL consolidation CT head 8/6> neg   SIGNIFICANT EVENTS/RESOLVED DX: 8/7: Septic shock resolved 8/7. Pressors weaned off.  Acute encephalopathy(resolved) 8/8-8/9: moved to medical ward, therapy continued 8/10: dyspnea a little worse. Still having hemoptysis. More Somnolent (xanax changed to PRN)  SUBJECTIVE:  Still very weak. More SOB    VITAL SIGNS: Temp:  [97.4 F (36.3 C)-98.4 F (36.9 C)] 97.4 F (36.3 C) (08/10 0603) Pulse Rate:  [101-107] 101 (08/10 0603) Resp:  [16-18] 18 (08/10 0603) BP: (132-158)/(60-68) 132/60 mmHg (08/10 0603) SpO2:  [93 %-96 %] 93 % (08/10 0603) Weight:  [66.225 kg (146 lb)] 66.225 kg (146 lb) (08/09 1500) 02 Rx  2lpm  INTAKE / OUTPUT:  Intake/Output Summary (Last 24 hours) at 07/10/14 1107 Last data filed at 07/09/14 2200  Gross per 24 hour  Intake    480 ml  Output      0 ml  Net    480 ml    PHYSICAL EXAMINATION: General:  Chronically ill appearing white female, not in acute distress >> stated age able to lie flat in bed, mod congested sounding cough  Neuro: more sleepy, oriented, no focal def  HEENT:  [place], no JVD, poor dentition, MM dry  Cardiovascular:  rrr III/VI SEM  Lungs:  Exp wheeze, scattered rhonchi. Posterior rales R>L. + tactile frem  Abdomen:  Soft, non-tender  Musculoskeletal:  Intact  Skin:  Intact   LABS:  CBC  Recent Labs Lab 07/07/14 0455 07/08/14 0345 07/09/14 0456  HGB 10.6* 11.0* 11.8*  HCT 31.3* 32.1* 35.4*  WBC 13.1* 10.6* 10.3  PLT 212 191 212     CHEMISTRY  Recent Labs Lab 07/06/14 1040 07/06/14 1800 07/07/14 0455 07/08/14 0345 07/09/14 0456  NA 119* 132* 127* 129* 127*  K 2.9* 5.2 3.7 3.8 3.3*  CL 78* 99 94* 92* 87*  CO2 27 25 24 27 29   GLUCOSE 98 116* 94 84 95  BUN 19 10 5* 4* 5*  CREATININE 0.63 0.45* 0.44* 0.39* 0.37*  CALCIUM 8.3* 7.6* 7.5* 7.9* 8.2*  MG  --   --   --   --  1.3*  PHOS  --   --   --   --  3.6   Estimated Creatinine Clearance: 64.8 ml/min (by C-G formula based on Cr of 0.37). INFECTIOUS  Recent Labs Lab 07/06/14 1107 07/06/14 1312 07/06/14 1453  LATICACIDVEN 1.12 1.2 0.8   Imaging No results found.   ASSESSMENT / PLAN:  Acute respiratory failure  Pneumonia (recurrent) dense consolidation of RLL  Hemoptysis Probable COPD Smoker  Concerned that this process has been going on since end of May. Etiology unclear: Resistant organism? Malignancy? endobronchial lesion?  Focal BOOP? [name]  Currently Rx as CAP >Urine antigens negative. Sputum culture on 8/6 NOF.  >says she's till coughing up blood. Dyspnea worse as of 8/10. Worried she may have some evolving necrotizing PNA   Plan:   O2 adjust to sats over 90%, Scheduled BDs, pulm hygiene  KVO IVFs f/u  urine histo antigen>>> Repeat sputum 8/9:>>> BCx2 8/6>>> Abx: vanc, start date 8/6 day 4/x Abx: azactam, start date 8/6 day 4/X, will d/c this today 8/10 ABX primaxin start date 8/10, day 0/x>>> IF still question of effusion will look at right Chest w/ [name]  Hyponatremia: Improved w/ saline resuscitation.  P:   F/u chemistry now and in am   Gait disturbance  H/o depression  Plan:   RASS goal: 0 Change xanax to PRN  Supportive care PT consult for mobility   Anemia  No evidence of bleeding other than intermittent hemoptysis  Plan Monitor  Patient seen and examined, agree with above note.  I dictated the care and orders written for this patient under my direction.  Rush Farmer, MD [phone]  07/10/2014, 11:07 AM

## 2014-07-11 DIAGNOSIS — R042 Hemoptysis: Secondary | ICD-10-CM

## 2014-07-11 DIAGNOSIS — R0609 Other forms of dyspnea: Secondary | ICD-10-CM

## 2014-07-11 DIAGNOSIS — R0989 Other specified symptoms and signs involving the circulatory and respiratory systems: Secondary | ICD-10-CM

## 2014-07-11 LAB — BASIC METABOLIC PANEL
Anion gap: 10 (ref 5–15)
BUN: 5 mg/dL — ABNORMAL LOW (ref 6–23)
CALCIUM: 8 mg/dL — AB (ref 8.4–10.5)
CHLORIDE: 87 meq/L — AB (ref 96–112)
CO2: 32 mEq/L (ref 19–32)
Creatinine, Ser: 0.31 mg/dL — ABNORMAL LOW (ref 0.50–1.10)
GFR calc Af Amer: >90 mL/min (ref >90)
GFR calc non Af Amer: >90 mL/min (ref >90)
GLUCOSE: 105 mg/dL — AB (ref 70–99)
Potassium: 3.3 mEq/L — ABNORMAL LOW (ref 3.7–5.3)
Sodium: 129 mEq/L — ABNORMAL LOW (ref 137–147)

## 2014-07-11 LAB — CULTURE, RESPIRATORY: Gram Stain: NONE SEEN

## 2014-07-11 LAB — CULTURE, RESPIRATORY W GRAM STAIN: Culture: NORMAL

## 2014-07-11 MED ORDER — LINEZOLID 2 MG/ML IV SOLN
600.0000 mg | Freq: Two times a day (BID) | INTRAVENOUS | Status: DC
  Administered 2014-07-11 – 2014-07-14 (×7): 600 mg via INTRAVENOUS
  Filled 2014-07-11 (×8): qty 300

## 2014-07-11 MED ORDER — TRAMADOL HCL 50 MG PO TABS
50.0000 mg | ORAL_TABLET | Freq: Four times a day (QID) | ORAL | Status: DC
  Administered 2014-07-11 – 2014-07-14 (×13): 50 mg via ORAL
  Filled 2014-07-11 (×13): qty 1

## 2014-07-11 MED ORDER — POTASSIUM CHLORIDE CRYS ER 20 MEQ PO TBCR
40.0000 meq | EXTENDED_RELEASE_TABLET | Freq: Once | ORAL | Status: AC
  Administered 2014-07-11: 40 meq via ORAL
  Filled 2014-07-11: qty 2

## 2014-07-11 MED ORDER — FUROSEMIDE 10 MG/ML IJ SOLN
40.0000 mg | Freq: Once | INTRAMUSCULAR | Status: AC
  Administered 2014-07-11: 40 mg via INTRAVENOUS
  Filled 2014-07-11: qty 4

## 2014-07-11 NOTE — Progress Notes (Signed)
CSW order received to evaluate patient for SNF placement per PT recommendation.  Fl2 initiated; SW Psychosocial Assessment to follow.  Lorie Phenix. Pauline Good, [place]

## 2014-07-11 NOTE — Progress Notes (Signed)
Physical Therapy Treatment Patient Details Name: Catherine Kelley MRN: [id number] DOB: 08-25-1951 Today's Date: 07/11/2014    History of Present Illness 63 yo female admitted with AMS and found to have CAP    PT Comments    Pt mobility continues to be limited by fatigue and feeling SOB.  O2 sats remained 90-93% on 2L O2 through out session.  Pt continues to have memory deficits and needs repeated cueing during session for safe technique with transfers.  Will continue to follow.    Follow Up Recommendations  SNF     Equipment Recommendations  Rolling walker with 5" wheels    Recommendations for Other Services       Precautions / Restrictions Precautions Precautions: Fall Precaution Comments: Sats remained 90-93% on 2L O2.   Restrictions Weight Bearing Restrictions: No    Mobility  Bed Mobility                  Transfers Overall transfer level: Needs assistance Equipment used: Rolling walker (2 wheeled) Transfers: Sit to/from Stand Sit to Stand: Min assist         General transfer comment: cues for UE use during each transfer.  pt with minimal carry over of cueing.    Ambulation/Gait Ambulation/Gait assistance: Min guard Ambulation Distance (Feet): 30 Feet (and 10' x2) Assistive device: Rolling walker (2 wheeled) Gait Pattern/deviations: Step-through pattern;Decreased stride length;Trunk flexed   Gait velocity interpretation: Below normal speed for age/gender General Gait Details: pt continues to move slowly.  O2 sats 92% on 2L O2 after amb 30'.     Stairs            Wheelchair Mobility    Modified Rankin (Stroke Patients Only)       Balance Overall balance assessment: Needs assistance Sitting-balance support: No upper extremity supported Sitting balance-Leahy Scale: Fair     Standing balance support: Single extremity supported Standing balance-Leahy Scale: Poor Standing balance comment: pt needs to lean against sink during hand hygiene in  order to maintain balance.                      Cognition Arousal/Alertness: Awake/alert Behavior During Therapy: WFL for tasks assessed/performed Overall Cognitive Status: Impaired/Different from baseline Area of Impairment: Memory     Memory: Decreased short-term memory              Exercises      General Comments        Pertinent Vitals/Pain Pain Assessment: No/denies pain    Home Living                      Prior Function            PT Goals (current goals can now be found in the care plan section) Acute Rehab PT Goals Patient Stated Goal: to hopefully be able to return to work PT Goal Formulation: With patient Time For Goal Achievement: 07/24/14 Potential to Achieve Goals: Good Progress towards PT goals: Progressing toward goals    Frequency  Min 3X/week    PT Plan Current plan remains appropriate    Co-evaluation             End of Session Equipment Utilized During Treatment: Gait belt;Oxygen Activity Tolerance: Patient limited by fatigue Patient left: in chair;with call bell/phone within reach;with family/visitor present     Time: 8786-7672 PT Time Calculation (min): 43 min  Charges:  $Gait Training: 8-22 mins $Therapeutic Activity: 23-37  mins                    G CodesCatarina Kelley, [place] 07/11/2014, 12:14 PM

## 2014-07-11 NOTE — Progress Notes (Signed)
PULMONARY / CRITICAL CARE MEDICINE   Name: Catherine Kelley MRN: [id number] DOB: [birth year]    ADMISSION DATE:  07/06/2014 CONSULTATION DATE:  8/6  REFERRING MD :  EDP   CHIEF COMPLAINT:  PNA/ AMS/ hypotension   INITIAL PRESENTATION:  63 year old female active smoker  presented 8/6 w/ working dx of recurrent RLL pneumonia, newly complicated by acute encephalopathy and shock not responding to IVFs. PCCM asked to admit.     STUDIES:  CT chest 8/6> RLL consolidation CT head 8/6> neg   SIGNIFICANT EVENTS/RESOLVED DX: 8/7: Septic shock resolved 8/7. Pressors weaned off.  Acute encephalopathy(resolved) 8/8-8/9: moved to medical ward, therapy continued 8/10: dyspnea a little worse. Still having hemoptysis. More Somnolent (xanax changed to PRN)  SUBJECTIVE:  Still very weak. More SOB    VITAL SIGNS: Temp:  [97.5 F (36.4 C)-98.2 F (36.8 C)] 97.5 F (36.4 C) (08/11 0411) Pulse Rate:  [102-105] 102 (08/11 0411) Resp:  [18] 18 (08/11 0411) BP: (136-140)/(72-74) 138/74 mmHg (08/11 0411) SpO2:  [99 %-100 %] 100 % (08/11 0411) Weight:  [66.225 kg (146 lb)] 66.225 kg (146 lb) (08/10 1300) 02 Rx  2lpm  INTAKE / OUTPUT:  Intake/Output Summary (Last 24 hours) at 07/11/14 0825 Last data filed at 07/11/14 1749  Gross per 24 hour  Intake   1870 ml  Output      0 ml  Net   1870 ml    PHYSICAL EXAMINATION: General:  Chronically ill appearing white female, not in acute distress Neuro: more sleepy, oriented, no focal def  HEENT:  [place], no JVD, poor dentition, MM dry  Cardiovascular:  rrr III/VI SEM  Lungs: scattered rhonchi. Posterior rales R>L. + tactile frem  Abdomen:  Soft, non-tender  Musculoskeletal:  Intact  Skin:  Intact   LABS:  CBC  Recent Labs Lab 07/07/14 0455 07/08/14 0345 07/09/14 0456  HGB 10.6* 11.0* 11.8*  HCT 31.3* 32.1* 35.4*  WBC 13.1* 10.6* 10.3  PLT 212 191 212    CHEMISTRY  Recent Labs Lab 07/07/14 0455 07/08/14 0345 07/09/14 0456 07/10/14 1200  07/11/14 0444  NA 127* 129* 127* 130* 129*  K 3.7 3.8 3.3* 3.3* 3.3*  CL 94* 92* 87* 88* 87*  CO2 24 27 29 30  32  GLUCOSE 94 84 95 109* 105*  BUN 5* 4* 5* 6 5*  CREATININE 0.44* 0.39* 0.37* 0.32* 0.31*  CALCIUM 7.5* 7.9* 8.2* 8.2* 8.0*  MG  --   --  1.3*  --   --   PHOS  --   --  3.6  --   --    Estimated Creatinine Clearance: 64.8 ml/min (by C-G formula based on Cr of 0.31). INFECTIOUS  Recent Labs Lab 07/06/14 1107 07/06/14 1312 07/06/14 1453  LATICACIDVEN 1.12 1.2 0.8   Imaging Dg Chest Port 1 View  07/10/2014   CLINICAL DATA:  Evaluate right-sided pneumonia.  Followup.  EXAM: PORTABLE CHEST - 1 VIEW  COMPARISON:  07/08/2014  FINDINGS: Midline trachea. Mild cardiomegaly. Probable small right pleural effusion. Removal of right-sided subclavian line. No pneumothorax. Somewhat more focal consolidation involves the right lower lobe. Lucent area within. Left lung clear. There may be minimal but improved dependent right upper lobe airspace disease.  IMPRESSION: Somewhat more focal consolidation in the right lower lobe. Suspicious for pneumonia. Relative lucency centrally could represent residual aerated lung or developing extra alveolar air/necrosis. Recommend attention on follow-up.  Improved right upper lobe aeration.   Electronically Signed   By: Marylyn Ishihara  Jobe Igo M.D.   On: 07/10/2014 12:26  concern for evolving Right LL necrosis   ASSESSMENT / PLAN:  Acute respiratory failure  Pneumonia (recurrent) dense consolidation of RLL  Hemoptysis Probable COPD Smoker  Concerned that this process has been going on since end of May. Etiology unclear: Resistant organism? Malignancy? endobronchial lesion?  Focal BOOP? [name]  Currently Rx as CAP >All Urine antigens (strep,legionella and Histo) negative. Sputum culture on 8/6 and 8/9 = NOF.  >  Worried she may have some evolving necrotizing PNA. But also element of edema. Still coughing up blood tinged sputum.   Plan:   O2 adjust to sats over 90%,  Scheduled BDs, pulm hygiene  KVO IVFs Lasix today 8/11 BCx2 8/6>>> Repeat sputum 8/11>>> Abx: vanc, start date 8/6 day 5/x: although has yet to grow SA on two different samples, her sputum remain blood tinged, foul odor and purulent. So will repeat culture and start zyvox.  Abx: azactam, start date 8/6 day 4/X, will d/c this today 8/10 ABX primaxin start date 8/10, day 1/x>>> ABX zyvox: start date: 8/11. Day 0/X.  If no clinical improvement by 8/13 will need FOB for airway inspection given bloody sputum.  Repeat CXR in am 8/12: IF still question of effusion will look at right Chest w/ [name]    hypoosmolar Hyponatremia:Not really improved w/ saline resuscitation. Remains low. Tsh and cortisol nml. Urine osmo was > 200 so suspect some degree of SIADH.  Hypokalemia  P:   KVO fluids Lasix X 1  Replace KCL  F/u am chemistry   Gait disturbance  H/o depression  Plan:   RASS goal: 0 Changed xanax to PRN  Supportive care PT and OT working w/ pt. Recommending skilled SNF for acute rehab.   Anemia  No evidence of bleeding other than intermittent hemoptysis  Plan Monitor  Patient seen and examined, agree with above note.  I dictated the care and orders written for this patient under my direction.  Rush Farmer, MD [phone]  07/11/2014, 8:25 AM

## 2014-07-12 ENCOUNTER — Inpatient Hospital Stay (HOSPITAL_COMMUNITY): Payer: Managed Care, Other (non HMO)

## 2014-07-12 LAB — EXPECTORATED SPUTUM ASSESSMENT W REFEX TO RESP CULTURE

## 2014-07-12 LAB — BASIC METABOLIC PANEL
Anion gap: 13 (ref 5–15)
BUN: 5 mg/dL — AB (ref 6–23)
CHLORIDE: 88 meq/L — AB (ref 96–112)
CO2: 30 meq/L (ref 19–32)
Calcium: 8.5 mg/dL (ref 8.4–10.5)
Creatinine, Ser: 0.32 mg/dL — ABNORMAL LOW (ref 0.50–1.10)
GFR calc Af Amer: >90 mL/min (ref >90)
GFR calc non Af Amer: >90 mL/min (ref >90)
Glucose, Bld: 91 mg/dL (ref 70–99)
Potassium: 3.5 mEq/L — ABNORMAL LOW (ref 3.7–5.3)
Sodium: 131 mEq/L — ABNORMAL LOW (ref 137–147)

## 2014-07-12 LAB — CULTURE, BLOOD (ROUTINE X 2)
CULTURE: NO GROWTH
Culture: NO GROWTH

## 2014-07-12 LAB — CBC
HEMATOCRIT: 36.6 % (ref 36.0–46.0)
HEMOGLOBIN: 12 g/dL (ref 12.0–15.0)
MCH: 30.9 pg (ref 26.0–34.0)
MCHC: 32.8 g/dL (ref 30.0–36.0)
MCV: 94.3 fL (ref 78.0–100.0)
Platelets: 234 10*3/uL (ref 150–400)
RBC: 3.88 MIL/uL (ref 3.87–5.11)
RDW: 13.9 % (ref 11.5–15.5)
WBC: 8.1 10*3/uL (ref 4.0–10.5)

## 2014-07-12 LAB — EXPECTORATED SPUTUM ASSESSMENT W GRAM STAIN, RFLX TO RESP C

## 2014-07-12 MED ORDER — POTASSIUM CHLORIDE 20 MEQ/15ML (10%) PO LIQD
40.0000 meq | Freq: Two times a day (BID) | ORAL | Status: AC
  Administered 2014-07-12 (×2): 40 meq via ORAL
  Filled 2014-07-12 (×3): qty 30

## 2014-07-12 MED ORDER — MAGNESIUM HYDROXIDE 400 MG/5ML PO SUSP
30.0000 mL | Freq: Every day | ORAL | Status: DC | PRN
  Administered 2014-07-13: 30 mL via ORAL
  Filled 2014-07-12: qty 30

## 2014-07-12 MED ORDER — FLEET ENEMA 7-19 GM/118ML RE ENEM
1.0000 | ENEMA | Freq: Every day | RECTAL | Status: DC | PRN
  Administered 2014-07-13: 1 via RECTAL
  Filled 2014-07-12: qty 1

## 2014-07-12 MED ORDER — FUROSEMIDE 10 MG/ML IJ SOLN
40.0000 mg | Freq: Once | INTRAMUSCULAR | Status: AC
  Administered 2014-07-12: 40 mg via INTRAVENOUS
  Filled 2014-07-12: qty 4

## 2014-07-12 MED ORDER — SENNA 8.6 MG PO TABS
2.0000 | ORAL_TABLET | Freq: Every day | ORAL | Status: DC
  Administered 2014-07-12 – 2014-07-13 (×2): 17.2 mg via ORAL
  Filled 2014-07-12 (×3): qty 2

## 2014-07-12 NOTE — Progress Notes (Signed)
Patient seen and examined, agree with above note.  I dictated the care and orders written for this patient under my direction.  Laketra Bowdish G Zaid Tomes, MD [phone] 

## 2014-07-12 NOTE — Clinical Social Work Note (Signed)
CSW assessment was completed by covering CSW, Kendell Bane, LCSW (to follow).  CSW met with pt at bedside to review SNF bed offers. Pt husband was also present.  Pt chooses bed at [facility], [place].  CSW contacted SNF and confirmed bed offer.  SNF will begin insurance authorization- [employer].  Nonnie Done, [place] [phone]  Clinical Social Work

## 2014-07-12 NOTE — Progress Notes (Signed)
PULMONARY / CRITICAL CARE MEDICINE   Name: Catherine Kelley MRN: [id number] DOB: 06-Oct-1951    ADMISSION DATE:  07/06/2014 CONSULTATION DATE:  8/6  REFERRING MD :  EDP   CHIEF COMPLAINT:  PNA/ AMS/ hypotension   INITIAL PRESENTATION:  63 year old female active smoker  presented 8/6 w/ working dx of recurrent RLL pneumonia, newly complicated by acute encephalopathy and shock not responding to IVFs. PCCM asked to admit.     STUDIES:  CT chest 8/6> RLL consolidation CT head 8/6> neg   SIGNIFICANT EVENTS/RESOLVED DX: 8/7: Septic shock resolved 8/7. Pressors weaned off.  Acute encephalopathy(resolved) 8/8-8/9: moved to medical ward, therapy continued 8/10: dyspnea a little worse. Still having hemoptysis. More Somnolent (xanax changed to PRN). Added Primaxin, Azactam stopped (8/7 to 8/10).  Concern for possible evolving abscess on CXR  8/11: still coughing up foul smelling purulent, blood tinged sputum. Not better symptomatically in spite of adding primaxin. D/c vanc (8/8-8/11), added Zyvox. Lasix added/ IVFs KVO'd given to rx mild SIADH.  8/12: Fluid balance still positive, CXR w/out sig improvement.   SUBJECTIVE:  Still very weak. A little better. No more coughing up blood    VITAL SIGNS: Temp:  [97.6 F (36.4 C)-97.8 F (36.6 C)] 97.8 F (36.6 C) (08/12 0600) Pulse Rate:  [102-110] 110 (08/12 0600) Resp:  [16-20] 20 (08/12 0600) BP: (129-153)/(55-68) 133/66 mmHg (08/12 0600) SpO2:  [92 %-99 %] 92 % (08/12 0600) Weight:  [66.1 kg (145 lb 11.6 oz)-67.6 kg (149 lb 0.5 oz)] 66.1 kg (145 lb 11.6 oz) (08/12 0600) 02 Rx  2lpm  INTAKE / OUTPUT:  Intake/Output Summary (Last 24 hours) at 07/12/14 0837 Last data filed at 07/11/14 1700  Gross per 24 hour  Intake    360 ml  Output      0 ml  Net    360 ml    PHYSICAL EXAMINATION: General:  Chronically ill appearing white female, not in acute distress Neuro: more sleepy, oriented, no focal def  HEENT:  , no JVD, poor dentition, MM  dry  Cardiovascular:  rrr III/VI SEM  Lungs: scattered rhonchi. Posterior rales R>L. + tactile frem remain Abdomen:  Soft, non-tender  Musculoskeletal:  Intact  Skin:  Intact   LABS:  CBC  Recent Labs Lab 07/08/14 0345 07/09/14 0456 07/12/14 0526  HGB 11.0* 11.8* 12.0  HCT 32.1* 35.4* 36.6  WBC 10.6* 10.3 8.1  PLT 191 212 234    CHEMISTRY  Recent Labs Lab 07/08/14 0345 07/09/14 0456 07/10/14 1200 07/11/14 0444 07/12/14 0526  NA 129* 127* 130* 129* 131*  K 3.8 3.3* 3.3* 3.3* 3.5*  CL 92* 87* 88* 87* 88*  CO2 27 29 30  32 30  GLUCOSE 84 95 109* 105* 91  BUN 4* 5* 6 5* 5*  CREATININE 0.39* 0.37* 0.32* 0.31* 0.32*  CALCIUM 7.9* 8.2* 8.2* 8.0* 8.5  MG  --  1.3*  --   --   --   PHOS  --  3.6  --   --   --    Estimated Creatinine Clearance: 64.8 ml/min (by C-G formula based on Cr of 0.32). INFECTIOUS  Recent Labs Lab 07/06/14 1107 07/06/14 1312 07/06/14 1453  LATICACIDVEN 1.12 1.2 0.8   Imaging No results found.concern for evolving Right LL necrosis   ASSESSMENT / PLAN:  Acute respiratory failure  Pneumonia (recurrent) dense consolidation of RLL  Hemoptysis Probable COPD Smoker  Concerned that this process has been going on since end of May. Etiology  unclear: Resistant organism? Malignancy? endobronchial lesion?  Focal BOOP? [name]  Currently Rx as CAP >All Urine antigens (strep,legionella and Histo) negative. Sputum culture on 8/6 and 8/9 = NOF.  >  Worried she may have some evolving necrotizing PNA. But also element of edema. Still coughing up blood tinged sputum. Completed 5d vanc   Plan:   O2 adjust to sats over 90%, Scheduled BDs, pulm hygiene  KVO IVFs Lasix today 8/11 BCx2 8/6>>> Repeat sputum 8/12>>> ABX primaxin start date 8/10, day 2/x>>> ABX zyvox: start date: 8/11. Day 1/X.  If no clinical improvement by 8/13 will need FOB for airway inspection given bloody sputum.  Will look at Right chest w/ [name] to evaluate degree of effusion      hypoosmolar Hyponatremia:Not really improved w/ saline resuscitation. Remains low. Tsh and cortisol nml. Urine osmo was > 200 so c/w degree of SIADH.  Hypokalemia  P:   KVO fluids Cont lasix-->aim for neg fluid balance  Replace KCL  F/u am chemistry 8/13  Gait disturbance  H/o depression  Plan:   Changed xanax to PRN  Supportive care PT and OT working w/ pt. Recommending skilled SNF for acute rehab.   Anemia  No evidence of bleeding other than intermittent hemoptysis  Plan Monitor SCDs  Erick Colace, NP  07/12/2014, 8:37 AM  Patient seen and examined, agree with above note.  I dictated the care and orders written for this patient under my direction.  Rush Farmer, MD [phone]

## 2014-07-12 NOTE — Progress Notes (Signed)
            Procedure:  bedside US evaluation of Right chest: Findings: Very small right effusion. Pictures reflect two different views. Do not think these are large enough to warrant diagnostic or therapeutic sampling.   Marni Griffon ACNP-BC [facility] Pager # [phone] OR # [phone] if no answer

## 2014-07-13 ENCOUNTER — Inpatient Hospital Stay (HOSPITAL_COMMUNITY): Payer: Managed Care, Other (non HMO)

## 2014-07-13 DIAGNOSIS — J449 Chronic obstructive pulmonary disease, unspecified: Secondary | ICD-10-CM

## 2014-07-13 LAB — BASIC METABOLIC PANEL
Anion gap: 9 (ref 5–15)
BUN: 5 mg/dL — AB (ref 6–23)
CALCIUM: 8.6 mg/dL (ref 8.4–10.5)
CO2: 34 mEq/L — ABNORMAL HIGH (ref 19–32)
Chloride: 92 mEq/L — ABNORMAL LOW (ref 96–112)
Creatinine, Ser: 0.36 mg/dL — ABNORMAL LOW (ref 0.50–1.10)
GFR calc Af Amer: >90 mL/min (ref >90)
Glucose, Bld: 107 mg/dL — ABNORMAL HIGH (ref 70–99)
Potassium: 4 mEq/L (ref 3.7–5.3)
Sodium: 135 mEq/L — ABNORMAL LOW (ref 137–147)

## 2014-07-13 NOTE — H&P (Signed)
Please note the H&P note was mislabeled as consult note in the EMR.  See Note in Consult section on 8/6 by Dr Chase Caller for HPI   Marni Griffon ACNP-BC [facility] Pager # [phone] OR # [phone] if no answer

## 2014-07-13 NOTE — Consult Note (Signed)
[facility] for Infectious Disease    Date of Admission:  07/06/2014    Total days of antibiotics 8        Day 4 Imipenem-Cilastin        Day 3 Linezolid        Vancomycin 8/6-8/11        Aztreonam 8/6-8/10        Levofloxacin 8/6-8/10       Reason for Consult: Extensive necrotizing pneumonia of right lung    Referring Physician: Dr. Brand Males Primary Care Physician: Dr. [name]  Active Problems:   Septic shock   CAP (community acquired pneumonia)   Acute respiratory failure with hypoxia   Hyponatremia   . dextromethorphan-guaiFENesin  1 tablet Oral BID  . divalproex  500 mg Oral QHS  . imipenem-cilastatin  250 mg Intravenous 4 times per day  . linezolid  600 mg Intravenous Q12H  . nortriptyline  100 mg Oral QHS  . pantoprazole  40 mg Oral Q1200  . senna  2 tablet Oral QHS  . traMADol  50 mg Oral 4 times per day    Recommendations: 1. Continue current antimicrobial therapy pending repeat chest CT scan   Assessment: 63 y/o F w/ history of COPD and recent recurrent pneumonia since 03/2014, admitted for septic shock thought to be secondary to RLL consolidation. Patient has been treated w/ several courses of po antibiotics prior to this admission including azithromycin, levofloxacin, and doxycycline, but was not previously admitted to the hospital. On admission patient was found to be in febrile, w/ septic shock, requiring pressors. She was originally treated w/ Aztreonam, Levaquin, and Vancomycin, changed to Imipenem and Linezolid d/t lack of significant clinical improvement. Patient now seems to be clinically better, however, she claims she still does not feel well enough to return home. Blood and sputum cultures continue to be negative.   HPI: Catherine Kelley is a 63 y.o. female w/ PMHx of HTN, COPD, anxiety, depression, and tobacco abuse, presented to the [facility] ED on 07/06/14 after being found on the floor at home. Patient was found to be hypotensive in the ED  w/ mild fever, and CXR suggestive of RLL infiltrate. Patient has a h/o recurrent pneumonia starting in 03/2014, for which she has received several courses of antibiotics, most recently, doxycycline, which she had completed on 06/18/14. Since that time, she claims her cough never resolved and became worse 1 week prior to her admission on August 6th. She claims the cough was productive of a dark sputum, frequently w/ blood, and also had associated subjective fever, chills, and chest pain.   Review of Systems: General: Positive for fatigue. Denies fever, chills, diaphoresis, appetite change.  Respiratory: Positive for productive cough and wheezing. Denies SOB, DOE, chest tightness.   Cardiovascular: Denies chest pain and palpitations.  Gastrointestinal: Positive for mild abdominal pain and constipation. Denies nausea, vomiting, diarrhea, blood in stool and abdominal distention.  Genitourinary: Denies dysuria, urgency, frequency, hematuria, and flank pain. Endocrine: Denies hot or cold intolerance, polyuria, and polydipsia. Musculoskeletal: Denies myalgias, back pain, joint swelling, arthralgias and gait problem.  Skin: Denies pallor, rash and wounds.  Neurological: Positive for generalized weakness. Denies dizziness, seizures, syncope, lightheadedness, numbness and headaches.  Psychiatric/Behavioral: Positive for mild confusion. Denies mood changes, nervousness, sleep disturbance and agitation.   Past Medical History  Diagnosis Date  . Hypertension     essential hypertension  . Tobacco abuse     smoking  about 1/2 pk of cigarettes a day  . Anxiety   . Depression     psychiatrist Dr. Toy Care  . Respiratory infection   . Shortness of breath   . Malaise   . Lightheadedness   . Emphysematous COPD     changes in right base along with patchy areas on last x-ray  . COPD (chronic obstructive pulmonary disease)   . History of echocardiogram 12/24/2006    Est. EF of 23-76% NORMAL LV SYSTOLIC FUNCTION  WITH IMPAIRED RELAXATION -- MILD AORTIC SCLEROSIS -- NORMAL PALONARY ARTERY PRESSURE -- NO OLD ECHOS FOR COMPARISON -- [name], MD  . History of cardiovascular stress test 08/15/2004    EF of 70% -- Normal stress cardiolite.  There is no evidence of ischemia and there is normal LV function. -- [name]. MD  . Diastolic dysfunction   . Allergy   . Asthma   . Heart murmur   . Pneumonia     "several times since May 2015" (07/06/2014)  . Chronic bronchitis     "get it alot; maybe not q yr" (07/06/2014)    History  Substance Use Topics  . Smoking status: Current Every Day Smoker -- 1.00 packs/day for 42 years    Types: Cigarettes  . Smokeless tobacco: Never Used  . Alcohol Use: No    Family History  Problem Relation Age of Onset  . Heart disease Mother   . Stroke Mother   . Heart disease Father   . Hyperlipidemia Father   . Hypertension Father   . Cancer Maternal Aunt    Allergies  Allergen Reactions  . Augmentin [Amoxicillin-Pot Clavulanate]   . Ceclor [Cefaclor]     hives  . Escitalopram Oxalate     Pt does not recall ever taking medication  . Penicillins     rash  . Sertraline Hcl   . Sulfa Drugs Cross Reactors     Hives and rash    OBJECTIVE: Blood pressure 147/63, pulse 96, temperature 98.1 F (36.7 C), temperature source Oral, resp. rate 20, height 5\' 5"  (1.651 m), weight 142 lb 11.2 oz (64.728 kg), SpO2 92.00%.  Physical Exam: General: Cooperative, no acute distress. Appears older than stated age. Somewhat somnolent. HEENT: PERRL, EOMI. Moist mucus membranes. Poor dentition.  Neck: Full range of motion without pain, supple, no lymphadenopathy or carotid bruits Lungs: Diffuse rhonchi. End expiratory wheeze L>R. Decreased breath sounds in RLL, bronchial breath sounds.  Heart: Regular rate and rhythm, no murmurs.  Abdomen: Soft, mildly tender diffusely. Hypoactive bowel sounds.  Extremities: No cyanosis, clubbing, or edema. Neurologic: Alert &  oriented x3, cranial nerves II-XII intact, strength grossly intact, sensation intact to light touch  Lab Results Lab Results  Component Value Date   WBC 8.1 07/12/2014   HGB 12.0 07/12/2014   HCT 36.6 07/12/2014   MCV 94.3 07/12/2014   PLT 234 07/12/2014    Lab Results  Component Value Date   CREATININE 0.36* 07/13/2014   BUN 5* 07/13/2014   NA 135* 07/13/2014   K 4.0 07/13/2014   CL 92* 07/13/2014   CO2 34* 07/13/2014    Lab Results  Component Value Date   ALT 17 07/08/2014   AST 26 07/08/2014   ALKPHOS 97 07/08/2014   BILITOT 0.4 07/08/2014     Microbiology: Recent Results (from the past 240 hour(s))  CULTURE, BLOOD (ROUTINE X 2)     Status: None   Collection Time    07/06/14 10:40 AM  Result Value Ref Range Status   Specimen Description BLOOD RIGHT FOREARM   Final   Special Requests BOTTLES DRAWN AEROBIC AND ANAEROBIC 5CC   Final   Culture  Setup Time     Final   Value: 07/06/2014 16:03     Performed at [facility]   Culture     Final   Value: NO GROWTH 5 DAYS     Performed at [facility]   Report Status 07/12/2014 FINAL   Final  CULTURE, BLOOD (ROUTINE X 2)     Status: None   Collection Time    07/06/14 11:03 AM      Result Value Ref Range Status   Specimen Description BLOOD LEFT ARM   Final   Special Requests BOTTLES DRAWN AEROBIC AND ANAEROBIC 5CC   Final   Culture  Setup Time     Final   Value: 07/06/2014 16:03     Performed at [facility]   Culture     Final   Value: NO GROWTH 5 DAYS     Performed at [facility]   Report Status 07/12/2014 FINAL   Final  URINE CULTURE     Status: None   Collection Time    07/06/14  3:09 PM      Result Value Ref Range Status   Specimen Description URINE, CATHETERIZED   Final   Special Requests NONE   Final   Culture  Setup Time     Final   Value: 07/06/2014 18:18     Performed at [place]     Final   Value: NO GROWTH     Performed at [facility]    Culture     Final   Value: NO GROWTH     Performed at [facility]   Report Status 07/07/2014 FINAL   Final  MRSA PCR SCREENING     Status: None   Collection Time    07/06/14  3:33 PM      Result Value Ref Range Status   MRSA by PCR NEGATIVE  NEGATIVE Final   Comment:            The GeneXpert MRSA Assay (FDA     approved for NASAL specimens     only), is one component of a     comprehensive MRSA colonization     surveillance program. It is not     intended to diagnose MRSA     infection nor to guide or     monitor treatment for     MRSA infections.  CULTURE, EXPECTORATED SPUTUM-ASSESSMENT     Status: None   Collection Time    07/06/14  4:40 PM      Result Value Ref Range Status   Specimen Description SPUTUM   Final   Special Requests NONE   Final   Sputum evaluation     Final   Value: THIS SPECIMEN IS ACCEPTABLE. RESPIRATORY CULTURE REPORT TO FOLLOW.   Report Status 07/06/2014 FINAL   Final  CULTURE, RESPIRATORY (NON-EXPECTORATED)     Status: None   Collection Time    07/06/14  4:40 PM      Result Value Ref Range Status   Specimen Description SPUTUM   Final   Special Requests NONE   Final   Gram Stain     Final   Value: ABUNDANT WBC PRESENT,BOTH PMN AND MONONUCLEAR     MODERATE SQUAMOUS EPITHELIAL CELLS PRESENT  RARE GRAM POSITIVE COCCI IN PAIRS     IN CLUSTERS RARE GRAM POSITIVE RODS     Performed at [facility]   Culture     Final   Value: NORMAL OROPHARYNGEAL FLORA     Performed at [facility]   Report Status 07/09/2014 FINAL   Final  CULTURE, EXPECTORATED SPUTUM-ASSESSMENT     Status: None   Collection Time    07/09/14  9:59 AM      Result Value Ref Range Status   Specimen Description SPUTUM   Final   Special Requests NONE   Final   Sputum evaluation     Final   Value: THIS SPECIMEN IS ACCEPTABLE. RESPIRATORY CULTURE REPORT TO FOLLOW.   Report Status 07/09/2014 FINAL   Final  CULTURE, RESPIRATORY (NON-EXPECTORATED)     Status: None    Collection Time    07/09/14  9:59 AM      Result Value Ref Range Status   Specimen Description SPUTUM   Final   Special Requests NONE   Final   Gram Stain     Final   Value: NO WBC SEEN     FEW SQUAMOUS EPITHELIAL CELLS PRESENT     MODERATE GRAM POSITIVE COCCI     IN PAIRS IN CLUSTERS IN CHAINS RARE GRAM POSITIVE RODS     Performed at [facility]   Culture     Final   Value: NORMAL OROPHARYNGEAL FLORA     Performed at [facility]   Report Status 07/11/2014 FINAL   Final  CULTURE, EXPECTORATED SPUTUM-ASSESSMENT     Status: None   Collection Time    07/12/14  4:45 PM      Result Value Ref Range Status   Specimen Description SPUTUM   Final   Special Requests NONE   Final   Sputum evaluation     Final   Value: THIS SPECIMEN IS ACCEPTABLE. RESPIRATORY CULTURE REPORT TO FOLLOW.   Report Status 07/12/2014 FINAL   Final  CULTURE, RESPIRATORY (NON-EXPECTORATED)     Status: None   Collection Time    07/12/14  4:45 PM      Result Value Ref Range Status   Specimen Description SPUTUM   Final   Special Requests NONE   Final   Gram Stain     Final   Value: RARE WBC PRESENT,BOTH PMN AND MONONUCLEAR     RARE SQUAMOUS EPITHELIAL CELLS PRESENT     NO ORGANISMS SEEN     Performed at [facility]   Culture PENDING   Incomplete   Report Status PENDING   Incomplete    Luanne Bras, MD PGY-2, Internal Medicine Pager: [phone] 07/13/2014, 2:01 PM  Attending addendum: Catherine Kelley developed pneumonia symptoms and patchy right lower lobe infiltrate may. She's had 4 rounds of antibiotic therapy since that time including azithromycin, 2 courses of levofloxacin and doxycycline. She never cleared her symptoms and actually got much worse and was found down and confused and hypotensive on August 6 leading to admission. Chest x-ray and CT scan confirmed extensive necrotizing process in the right lower lung. Sputum Gram stain revealed gram-positive cocci and gram-positive rods blood  cultures grew only normal oral flora. She has allergies to beta-lactam antibiotics. She was treated initially with vancomycin and aztreonam then transitioned to linezolid and imipenem and has been improving slowly. I suspect the anaerobic coverage of imipenem has been the reason for her recent improvement. I will continue it for now and  await results of followup CT scan.  Michel Bickers, MD [facility] [phone] pager   [phone] cell 07/13/2014, 5:02 PM

## 2014-07-13 NOTE — Progress Notes (Signed)
PULMONARY / CRITICAL CARE MEDICINE   Name: Catherine Kelley MRN: [id number] DOB: [birth year]    ADMISSION DATE:  07/06/2014 CONSULTATION DATE:  8/6  REFERRING MD :  EDP   CHIEF COMPLAINT:  PNA/ AMS/ hypotension   INITIAL PRESENTATION:  63 year old female active smoker  presented 8/6 w/ working dx of recurrent RLL pneumonia, newly complicated by acute encephalopathy and shock not responding to IVFs. PCCM asked to admit.     STUDIES:  CT chest 8/6> RLL consolidation CT head 8/6> neg   SIGNIFICANT EVENTS/RESOLVED DX: 8/7: Septic shock resolved 8/7. Pressors weaned off.  Acute encephalopathy(resolved) 8/8-8/9: moved to medical ward, therapy continued 8/10: dyspnea a little worse. Still having hemoptysis. More Somnolent (xanax changed to PRN). Added Primaxin, Azactam stopped (8/7 to 8/10).  Concern for possible evolving abscess on CXR  8/11: still coughing up foul smelling purulent, blood tinged sputum. Not better symptomatically in spite of adding primaxin. D/c vanc (8/8-8/11), added Zyvox. Lasix added/ IVFs KVO'd given to rx mild SIADH.  8/12: Fluid balance still positive, CXR w/out sig improvement.  8/13: finally no longer coughing up blood. Feels a little better but still has thick purulent sputum. Cultures are pending. Repeat CT ordered to eval what looks like necr  SUBJECTIVE:  Still very weak. A little better. No more coughing up blood    VITAL SIGNS: Temp:  [97.5 F (36.4 C)-98.1 F (36.7 C)] 98.1 F (36.7 C) (08/13 0651) Pulse Rate:  [93-98] 96 (08/13 0651) Resp:  [18-20] 20 (08/13 0651) BP: (123-147)/(63-67) 147/63 mmHg (08/13 0651) SpO2:  [92 %-97 %] 92 % (08/13 0651) FiO2 (%):  [1 %] 1 % (08/12 1400) Weight:  [64.728 kg (142 lb 11.2 oz)] 64.728 kg (142 lb 11.2 oz) (08/13 0651) 02 Rx  Room air   INTAKE / OUTPUT:  Intake/Output Summary (Last 24 hours) at 07/13/14 1116 Last data filed at 07/12/14 1829  Gross per 24 hour  Intake    800 ml  Output      0 ml  Net    800  ml    PHYSICAL EXAMINATION: General:  Chronically ill appearing white female, not in acute distress Neuro: more sleepy, oriented, no focal def  HEENT:  , no JVD, poor dentition, MM dry  Cardiovascular:  rrr III/VI SEM  Lungs: scattered rhonchi. Posterior rales R>L. + tactile frem remain remains  Abdomen:  Soft, non-tender  Musculoskeletal:  Intact  Skin:  Intact   LABS:  CBC  Recent Labs Lab 07/08/14 0345 07/09/14 0456 07/12/14 0526  HGB 11.0* 11.8* 12.0  HCT 32.1* 35.4* 36.6  WBC 10.6* 10.3 8.1  PLT 191 212 234    CHEMISTRY  Recent Labs Lab 07/08/14 0345 07/09/14 0456 07/10/14 1200 07/11/14 0444 07/12/14 0526  NA 129* 127* 130* 129* 131*  K 3.8 3.3* 3.3* 3.3* 3.5*  CL 92* 87* 88* 87* 88*  CO2 27 29 30  32 30  GLUCOSE 84 95 109* 105* 91  BUN 4* 5* 6 5* 5*  CREATININE 0.39* 0.37* 0.32* 0.31* 0.32*  CALCIUM 7.9* 8.2* 8.2* 8.0* 8.5  MG  --  1.3*  --   --   --   PHOS  --  3.6  --   --   --    Estimated Creatinine Clearance: 64.8 ml/min (by C-G formula based on Cr of 0.32). INFECTIOUS  Recent Labs Lab 07/06/14 1312 07/06/14 1453  LATICACIDVEN 1.2 0.8   Imaging Dg Chest [facility] 1 View  07/12/2014  CLINICAL DATA:  Shortness of breath, followup lung infiltrates  EXAM: PORTABLE CHEST - 1 VIEW  COMPARISON:  Portable chest x-ray of 07/10/2014  FINDINGS: There is little change in extensive consolidation of the right lung base most consistent are pneumonia and possible small effusion. Again cavitation cannot be excluded and followup is recommended. The left lung is clear. Heart size is stable.  IMPRESSION: [place] change in consolidation of the right lung base most consistent with pneumonia and small effusion. Again recommend followup to exclude early cavitation.   Electronically Signed   By: Ivar Drape M.D.   On: 07/12/2014 08:03  concern for evolving Right LL necrosis   ASSESSMENT / PLAN:  Acute respiratory failure  Pneumonia (recurrent) dense consolidation of RLL   Hemoptysis Probable COPD Smoker   Etiology unclear: Resistant organism? Malignancy? endobronchial lesion?  Focal BOOP? [name]  Currently Rx as CAP. Worried she may have some evolving necrotizing PNA. But also element of edema.  > All Urine antigens (strep,legionella and Histo) negative. Sputum culture on 8/6 and 8/9 = NOF. Blood cultures negative > Seems to have improved some. Not sure if this was due to the change in ABX or lasix  Plan:   O2 adjust to sats over 90%, Scheduled BDs, pulm hygiene  KVO IVFs Repeat sputum 8/12>>> ABX primaxin start date 8/10, day 3/x>>> ABX zyvox: start date: 8/11. Day 2/X.   Repeat CT non-contrast 8/13 Will ask ID to see her. Hesitant to transition to orals (has seen azith, levaquin and doxy out-pt) Think 6-8 week course of IV will be needed.    hypoosmolar Hyponatremia:Not really improved w/ saline resuscitation. Remains low. Tsh and cortisol nml. Urine osmo was > 200 so c/w degree of SIADH.  Hypokalemia  P:   KVO fluids F/u am chemistry 8/13 (pending)  Gait disturbance  H/o depression  Plan:   Changed xanax to PRN  Supportive care PT and OT working w/ pt. Recommending skilled SNF for acute rehab.   Anemia  No evidence of bleeding other than intermittent hemoptysis  Plan Monitor SCDs  Erick Colace, NP  Patient seen and examined, agree with above note.  I dictated the care and orders written for this patient under my direction.  Rush Farmer, MD [phone]  07/13/2014, 11:16 AM

## 2014-07-13 NOTE — Progress Notes (Signed)
Physical Therapy Treatment Patient Details Name: Catherine Kelley MRN: [id number] DOB: 1951/04/06 Today's Date: 07/13/2014    History of Present Illness 63 yo female admitted with AMS and found to have CAP    PT Comments    Pt slowly progressing with therapy. Continues to be limited due to decr activity tolerance. Requires min (A) to maintain balance and mobilize with use of RW. Cont to recommend SNF for post acute rehab.   Follow Up Recommendations  SNF     Equipment Recommendations  Rolling walker with 5" wheels    Recommendations for Other Services       Precautions / Restrictions Precautions Precautions: Fall Precaution Comments: kept pt on 2L O2 during session Restrictions Weight Bearing Restrictions: No    Mobility  Bed Mobility Overal bed mobility: Needs Assistance Bed Mobility: Supine to Sit     Supine to sit: Supervision;HOB elevated     General bed mobility comments: heavily used handrails for bed mobility  Transfers Overall transfer level: Needs assistance Equipment used: Rolling walker (2 wheeled) Transfers: Sit to/from [facility] Sit to Stand: Min assist Stand pivot transfers: Min assist       General transfer comment: performed sit to stand x 3; and performed SPT immediately due to urgency of going to bathroom; pt begain to urinate with 2 standing attempts; cues for sequencing and min (A) to balance and manage RW  Ambulation/Gait Ambulation/Gait assistance: Min assist Ambulation Distance (Feet): 60 Feet Assistive device: Rolling walker (2 wheeled) Gait Pattern/deviations: Step-through pattern;Decreased stride length;Trunk flexed;Wide base of support Gait velocity: decreased Gait velocity interpretation: Below normal speed for age/gender General Gait Details: cues for RW management; pt with LOB and required (A) to regain balance; cues for upright posture; fatigues quickly   Stairs            Wheelchair Mobility     Modified Rankin (Stroke Patients Only)       Balance Overall balance assessment: Needs assistance Sitting-balance support: Feet supported;No upper extremity supported Sitting balance-Leahy Scale: Fair     Standing balance support: During functional activity;Single extremity supported Standing balance-Leahy Scale: Poor Standing balance comment: requires (A) when standing to perform pericare at [facility]                    Cognition Arousal/Alertness: Awake/alert Behavior During Therapy: WFL for tasks assessed/performed Overall Cognitive Status: Impaired/Different from baseline Area of Impairment: Memory;Problem solving     Memory: Decreased short-term memory       Problem Solving: Slow processing;Decreased initiation;Requires verbal cues      Exercises      General Comments        Pertinent Vitals/Pain Pain Assessment: No/denies pain    Home Living                      Prior Function            PT Goals (current goals can now be found in the care plan section) Acute Rehab PT Goals Patient Stated Goal: to go to the bathroom PT Goal Formulation: With patient Time For Goal Achievement: 07/24/14 Potential to Achieve Goals: Good Progress towards PT goals: Progressing toward goals    Frequency  Min 3X/week    PT Plan Current plan remains appropriate    Co-evaluation             End of Session Equipment Utilized During Treatment: Gait belt;Oxygen Activity Tolerance: Patient limited by fatigue Patient left:  in chair;with call bell/phone within reach;with chair alarm set     Time: [phone] PT Time Calculation (min): 32 min  Charges:  $Gait Training: 8-22 mins $Therapeutic Activity: 8-22 mins                    G Codes:      Elie Confer [place], [place] 07/13/2014, 12:08 PM

## 2014-07-13 NOTE — Discharge Summary (Signed)
Physician Discharge Summary       Patient ID: Catherine Kelley MRN: [id number] DOB/AGE: 05-10-1951 63 y.o.  Admit date: 07/06/2014 Discharge date: 07/14/2014  Discharge Diagnoses:  Acute respiratory failure  Cavitary Pneumonia (NOS)  Hemoptysis  Probable COPD  Smoker  hypoosmolar Hyponatremia/ SIADH Hypokalemia  Gait disturbance  H/o depression  Deconditioning  Anemia  Detailed Hospital Course:  This is a 63 year old female, active smoker. Presents to the ER at [facility] on 8/6 after found on floor at home. She was confused and gait was unsteady after they assisted her to her feet. EMS was called. On arrival to ER she was noted to be hypotensive w/ low gd temp. CXR showed RLL consolidation. Further chart review and per interview w/ family pt had been treated for what was felt to be recurrent pneumonia since the end of May 2015. She has been treated with 1 round of azithro, 2 rounds of levaquin and most recently 10 days of doxy completed on 7/19. She states that her cough has not ever resolved, but she did feel better with antibiotics. Per pt her cough began to worsen yet again about 1 week prior to presentation. It was productive blood tinged sputum, and associated w/ fever, chills and some chest discomfort for the last week. In the ER she has remained hypotensive in spite of 3 liters NS. PCCM was asked to admit for sepsis/septic shock in setting of PNA.  She was admitted to the intensive care. Sputum,blood and urine antigens were sent. She was started on broad spectrum antibiotics which included initially: vanc, levaquin and azactam. She received multiple fluid challenges and remained hypotensive so a triple lumen catheter was placed and she was started on goal directed therapy to guide volume resuscitation efforts and vasoactive gtt administration. She was weaned off pressors as of 8/7. On 8/8 she was moved to the medical ward. Antibiotics were continued. On rounds 8/10: dyspnea a little worse. Still  having hemoptysis. More Somnolent (xanax changed to PRN). Added Primaxin, Azactam stopped (8/7 to 8/10). Concern for possible evolving abscess on CXR. 8/11: still coughing up foul smelling purulent, blood tinged sputum. Not better symptomatically in spite of adding primaxin. D/c vanc (8/8-8/11), added Zyvox. Lasix added/ IVFs KVO'd given to rx mild SIADH. 8/12: Fluid balance still positive, CXR w/out sig improvement. 8/13: finally no longer coughing up blood. Felt a little better but still has thick purulent sputum. Cultures are pending. Repeat CT ordered to eval what looks like abscess/ necrotizing changes. CT results showed progression of cavitary changes in the right lower lobe. In addition to CT we also asked infectious disease to assess Mrs Warrior, given her sputum cultures have been negative but clearly she seemed to improve with antibiotics. Ultimately it was decided that we would continue the current antibiotics for minimum 21 days with plan for f/u CXR on Sept 3rd . A PICC line was placed. At this point we felt she was stable to transfer to SNF for rehab efforts. She was deemed ready for d/c as of 8/14 with the plan as outlined below.      Discharge Plan by active problems   Acute respiratory failure  RLL cavitary Pneumonia (recurrent) dense consolidation of RLL  Hemoptysis  Probable COPD  Smoker   > All Urine antigens (strep,legionella and Histo) negative. Sputum culture on 8/6 and 8/9 = NOF. Blood cultures negative  >no growth in sputum from 8/12  Plan:  No more smoking ABX primaxin start date 8/10, day  4 of 21 minimum  ABX zyvox: start date: 8/11. Day 3/ of 21 minimum  F/u CXR Sept 3rd w/ Ramaswamy  Currently no clear mass but underlying malignancy remains on ddx. Defer possible FOB/bx until f/u imaging after abx Thursday sept 3rd at 3:30pm.   hypoosmolar Hyponatremia:Not really improved w/ saline resuscitation. Remains low. Tsh and cortisol nml. Urine osmo was > 200 so c/w degree of  SIADH.  Hypokalemia  Plan Add low dose daily lasix  Needs f/u chemistry 1 week to assess Na and K  Gait disturbance  H/o depression Physical deconditioning    Plan:  Changed xanax to PRN  Supportive care  skilled SNF for acute rehab.   Anemia  No evidence of bleeding other than intermittent hemoptysis  Plan  Monitor    Significant Hospital tests/ studies  Consults: infectious disease.  CT chest 8/6> RLL consolidation  CT head 8/6> neg  CT chest 8/13: Persistent dense right lower lobe consolidation with 6 cm rounded area showing increased parenchymal cavitation. Differential diagnosis remains cavitary pneumonia versus cavitary neoplasm. Increased airspace disease in both upper lobes and right middle lobe, consistent with pneumonia. Increased small bilateral pleural effusions.  SIGNIFICANT EVENTS/RESOLVED DX:  8/7: Septic shock resolved 8/7. Pressors weaned off. Acute encephalopathy(resolved)  8/8-8/9: moved to medical ward, therapy continued  8/10: dyspnea a little worse. Still having hemoptysis. More Somnolent (xanax changed to PRN). Added Primaxin, Azactam stopped (8/7 to 8/10). Concern for possible evolving abscess on CXR  8/11: still coughing up foul smelling purulent, blood tinged sputum. Not better symptomatically in spite of adding primaxin. D/c vanc (8/8-8/11), added Zyvox. Lasix added/ IVFs KVO'd given to rx mild SIADH.  8/12: Fluid balance still positive, CXR w/out sig improvement.  8/13: finally no longer coughing up blood. Feels a little better but still has thick purulent sputum. Cultures are pending. Repeat CT ordered to eval what looks like necr  8/13 seen by ID. Recommended continuing current ABX pending CT results  8/14: PICC to be placed.   Cultures > All Urine antigens (strep,legionella and Histo) negative. Sputum culture on 8/6 and 8/9 = NOF. Blood cultures negative  Discharge Exam: BP 140/66  Pulse 99  Temp(Src) 98.7 F (37.1 C) (Oral)  Resp 20  Ht  5\' 5"  (1.651 m)  Wt 64.638 kg (142 lb 8 oz)  BMI 23.71 kg/m2  SpO2 97%  General: Chronically ill appearing white female, not in acute distress  Neuro: more sleepy, oriented, no focal def  HEENT: [place], no JVD, poor dentition, MM dry  Cardiovascular: rrr III/VI SEM  Lungs: scattered rhonchi. Posterior rales R>L. Improved  Abdomen: Soft, non-tender  Musculoskeletal: Intact  Skin: Intact    Labs at discharge Lab Results  Component Value Date   CREATININE 0.52 07/14/2014   BUN 4* 07/14/2014   NA 133* 07/14/2014   K 5.3 07/14/2014   CL 92* 07/14/2014   CO2 32 07/14/2014   Lab Results  Component Value Date   WBC 8.1 07/12/2014   HGB 12.0 07/12/2014   HCT 36.6 07/12/2014   MCV 94.3 07/12/2014   PLT 234 07/12/2014   Lab Results  Component Value Date   ALT 17 07/08/2014   AST 26 07/08/2014   ALKPHOS 97 07/08/2014   BILITOT 0.4 07/08/2014   Lab Results  Component Value Date   INR 1.42 07/06/2014    Current radiology studies Ct Chest Wo Contrast  07/14/2014   CLINICAL DATA:  Shortness of breath. Pneumonia. Right lung cavitation.  EXAM:  CT CHEST WITHOUT CONTRAST  TECHNIQUE: Multidetector CT imaging of the chest was performed following the standard protocol without IV contrast.  COMPARISON:  07/06/2014  FINDINGS: Right lower lobe consolidation is again demonstrated with associated air bronchograms and mild to moderate bronchiectasis. A rounded area of parenchymal cavitation is seen within the right upper lobe consolidation which measures approximately 5 x 6 cm on image 33 of series 2. This shows no significant change in size but does show mild increase in cavitation since prior exam. Differential diagnosis remains cavitary pneumonia versus cavitary neoplasm. Small pleural effusions are seen bilaterally which arm mildly increased since previous exam.  Patchy airspace disease is seen within the anterior upper lobes bilaterally in the right middle lobe, which is increased since previous study. This is most  consistent with pneumonia. No other discrete pulmonary nodules or masses are identified. Mild emphysema again demonstrated.  No evidence of central endobronchial obstruction. Mild mediastinal lymphadenopathy is again seen in the right paratracheal and subcarinal regions measuring 1.4 and 1.6 cm respectively. The shows no significant change. No other sites of lymphadenopathy identified.  IMPRESSION: Persistent dense right lower lobe consolidation with 6 cm rounded area showing increased parenchymal cavitation. Differential diagnosis remains cavitary pneumonia versus cavitary neoplasm.  Increased airspace disease in both upper lobes and right middle lobe, consistent with pneumonia.  Increased small bilateral pleural effusions.  Stable mild mediastinal lymphadenopathy.   Electronically Signed   By: Earle Gell M.D.   On: 07/14/2014 07:27   Dg Chest Port 1 View  07/13/2014   CLINICAL DATA:  Shortness of breath, followup pneumonia  EXAM: PORTABLE CHEST - 1 VIEW  COMPARISON:  Portable chest x-ray of 812 and 07/10/2014  FINDINGS: There is little change in pleural and parenchymal opacity at the right lung base with probable effusion. Pneumonia is the primary consideration. As noted on recent chest x-rays, an area of cavitation cannot be excluded within this area of consolidation. The left lung is clear. Heart size is stable.  IMPRESSION: No significant change in opacity at the right lung base most consistent with pneumonia and effusion. Cannot exclude an area of cavitation as noted previously.   Electronically Signed   By: Ivar Drape M.D.   On: 07/13/2014 08:12    Disposition:  01-Home or Self Care      Discharge Instructions   Diet - low sodium heart healthy    Complete by:  As directed      Increase activity slowly    Complete by:  As directed             Medication List    STOP taking these medications       albuterol 108 (90 BASE) MCG/ACT inhaler  Commonly known as:  PROVENTIL HFA;VENTOLIN HFA    Replaced by:  albuterol (2.5 MG/3ML) 0.083% nebulizer solution     losartan-hydrochlorothiazide 50-12.5 MG per tablet  Commonly known as:  HYZAAR      TAKE these medications       albuterol (2.5 MG/3ML) 0.083% nebulizer solution  Commonly known as:  PROVENTIL  Take 3 mLs (2.5 mg total) by nebulization every 3 (three) hours as needed for wheezing or shortness of breath.     ALPRAZolam 0.5 MG tablet  Commonly known as:  XANAX  Take 0.5 mg by mouth 2 (two) times daily.     calcium-vitamin D 500-200 MG-UNIT per tablet  Take 1 tablet by mouth daily.     dextromethorphan-guaiFENesin 30-600 MG per 12 hr tablet  Commonly known as:  MUCINEX DM  Take 1 tablet by mouth 2 (two) times daily.     divalproex 500 MG 24 hr tablet  Commonly known as:  DEPAKOTE ER  Take 500 mg by mouth at bedtime.     furosemide 20 MG tablet  Commonly known as:  LASIX  Take 40 mg by mouth daily.     imipenem-cilastatin 250 mg in sodium chloride 0.9 % 100 mL  Inject 250 mg into the vein every 6 (six) hours.     linezolid 2 MG/ML IVPB  Commonly known as:  ZYVOX  - 600 mg IV every 12 hours for 21 days  - # 42 doses   -   - Refill # 1     magnesium hydroxide 400 MG/5ML suspension  Commonly known as:  MILK OF MAGNESIA  Take 30 mLs by mouth daily as needed for mild constipation.     multivitamin tablet  Take 1 tablet by mouth daily.     nadolol 40 MG tablet  Commonly known as:  CORGARD  Take 20 mg by mouth daily.     nortriptyline 50 MG capsule  Commonly known as:  PAMELOR  Take 100 mg by mouth at bedtime.     senna 8.6 MG Tabs tablet  Commonly known as:  SENOKOT  Take 2 tablets (17.2 mg total) by mouth at bedtime.     sodium chloride 0.9 % injection  Inject 10 mLs into the vein every 12 (twelve) hours.     traMADol 50 MG tablet  Commonly known as:  ULTRAM  Take 1 tablet (50 mg total) by mouth every 6 (six) hours.       Follow-up Information   Follow up with [facility], MD On  08/03/2014. (report at 3pm for check-in then CXR, followed by your doctor appointment at 330pm )    Specialty:  Pulmonary Disease   Contact information:   [address] [place] [address] [phone]       Discharged Condition: fair  Physician Statement:   The Patient was personally examined, the discharge assessment and plan has been personally reviewed and I agree with ACNP Babcock's assessment and plan. > 30 minutes of time have been dedicated to discharge assessment, planning and discharge instructions.   Signed: BABCOCK,PETE 07/14/2014, 12:07 PM  Patient seen and examined, agree with above note.  I dictated the care and orders written for this patient under my direction.  Rush Farmer, MD [phone]

## 2014-07-13 NOTE — Progress Notes (Signed)
Occupational Therapy Treatment Patient Details Name: Catherine Kelley MRN: [id number] DOB: [birth year] Today's Date: 07/13/2014    History of present illness 62 yo female admitted with AMS and found to have CAP   OT comments  Pt making progress with today's session focusing on toileting, LBD, grooming, and increasing endurance for activity.  Follow Up Recommendations  SNF    Equipment Recommendations  3 in 1 bedside comode       Precautions / Restrictions Precautions Precautions: Fall Precaution Comments: monitor sats Restrictions Weight Bearing Restrictions: No       Mobility Bed Mobility Overal bed mobility: Needs Assistance Bed Mobility: Supine to Sit;Sit to Supine     Supine to sit: Supervision;HOB elevated Sit to supine: Supervision      Transfers Overall transfer level: Needs assistance Equipment used: Rolling walker (2 wheeled) Transfers: Sit to/from Stand Sit to Stand: Min guard              Balance Overall balance assessment: Needs assistance Sitting-balance support: Feet supported;No upper extremity supported Sitting balance-Leahy Scale: Good     Standing balance support: During functional activity Standing balance-Leahy Scale: Poor                     ADL Overall ADL's : Needs assistance/impaired     Grooming: Wash/dry hands;Min guard;Standing               Lower Body Dressing: Moderate assistance;Sit to/from stand   Toilet Transfer: Min guard;Ambulation;RW;Comfort height toilet;Grab bars   Toileting- Clothing Manipulation and Hygiene: Min guard;Sit to/from stand         General ADL Comments: Pt not nearly as winded today (DOE)--not noticable to me, she reported just mildly SOB. Pt's Sats monitored throughout and lowest was 89%--educated pt on pursed lipped breathing. Pt did ambulate from her room to window at end of unit and back to her room  (about 50 feet)                Cognition   Behavior During Therapy: [facility] for  tasks assessed/performed Overall Cognitive Status: Impaired/Different from baseline (kept getting details mixed up about her daughters and grandkids--however she self-corrected without cues)                                    Pertinent Vitals/ Pain       Pain Assessment: No/denies pain         Frequency Min 2X/week     Progress Toward Goals  OT Goals(current goals can now be found in the care plan section)  Progress towards OT goals: Progressing toward goals     Plan Discharge plan remains appropriate       End of Session Equipment Utilized During Treatment: Gait belt;Rolling walker   Activity Tolerance Patient tolerated treatment well   Patient Left in bed;with call bell/phone within reach;with bed alarm set   Nurse Communication  (Pt reports she has not had a bowel movement since she was admitted)        Time: 1515-1600 OT Time Calculation (min): 45 min  Charges: OT Treatments $Self Care/Home Management : 38-52 mins  Almon Register [phone] 07/13/2014, 5:42 PM

## 2014-07-14 ENCOUNTER — Inpatient Hospital Stay (HOSPITAL_COMMUNITY): Payer: Managed Care, Other (non HMO)

## 2014-07-14 LAB — BASIC METABOLIC PANEL
Anion gap: 9 (ref 5–15)
BUN: 4 mg/dL — ABNORMAL LOW (ref 6–23)
CALCIUM: 8.5 mg/dL (ref 8.4–10.5)
CO2: 32 mEq/L (ref 19–32)
Chloride: 92 mEq/L — ABNORMAL LOW (ref 96–112)
Creatinine, Ser: 0.52 mg/dL (ref 0.50–1.10)
GFR calc Af Amer: >90 mL/min (ref >90)
GLUCOSE: 85 mg/dL (ref 70–99)
Potassium: 5.3 mEq/L (ref 3.7–5.3)
Sodium: 133 mEq/L — ABNORMAL LOW (ref 137–147)

## 2014-07-14 MED ORDER — SODIUM CHLORIDE 0.9 % IJ SOLN: 10.0000 mL | Freq: Two times a day (BID) | INTRAMUSCULAR | Status: DC

## 2014-07-14 MED ORDER — TRAMADOL HCL 50 MG PO TABS: 50.0000 mg | ORAL_TABLET | Freq: Four times a day (QID) | ORAL | Status: DC

## 2014-07-14 MED ORDER — DM-GUAIFENESIN ER 30-600 MG PO TB12: 1.0000 | ORAL_TABLET | Freq: Two times a day (BID) | ORAL | Status: DC

## 2014-07-14 MED ORDER — SENNA 8.6 MG PO TABS: 2.0000 | ORAL_TABLET | Freq: Every day | ORAL | Status: DC

## 2014-07-14 MED ORDER — ALBUTEROL SULFATE (2.5 MG/3ML) 0.083% IN NEBU: 2.5000 mg | INHALATION_SOLUTION | RESPIRATORY_TRACT | Status: DC | PRN

## 2014-07-14 MED ORDER — MAGNESIUM HYDROXIDE 400 MG/5ML PO SUSP: 30.0000 mL | Freq: Every day | ORAL | Status: DC | PRN

## 2014-07-14 MED ORDER — HEPARIN SOD (PORK) LOCK FLUSH 100 UNIT/ML IV SOLN
250.0000 [IU] | INTRAVENOUS | Status: AC | PRN
  Administered 2014-07-14: 250 [IU]

## 2014-07-14 MED ORDER — SODIUM CHLORIDE 0.9 % IJ SOLN
10.0000 mL | Freq: Two times a day (BID) | INTRAMUSCULAR | Status: DC
Start: 2014-07-14 — End: 2014-09-06

## 2014-07-14 MED ORDER — LINEZOLID 2 MG/ML IV SOLN: INTRAVENOUS | Status: DC

## 2014-07-14 MED ORDER — SODIUM CHLORIDE 0.9 % IJ SOLN
10.0000 mL | INTRAMUSCULAR | Status: DC | PRN
  Administered 2014-07-14: 10 mL

## 2014-07-14 MED ORDER — SODIUM CHLORIDE 0.9 % IV SOLN: 250.0000 mg | Freq: Four times a day (QID) | INTRAVENOUS | Status: DC

## 2014-07-14 MED ORDER — FUROSEMIDE 20 MG PO TABS
20.0000 mg | ORAL_TABLET | Freq: Every day | ORAL | Status: DC
  Administered 2014-07-14: 20 mg via ORAL
  Filled 2014-07-14: qty 1

## 2014-07-14 NOTE — Clinical Social Work Placement (Addendum)
    Clinical Social Work Department CLINICAL SOCIAL WORK PLACEMENT NOTE 07/11/2014  Patient:  Catherine Kelley, Catherine Kelley  Account Number:  [id number] Admit date:  07/06/2014  Clinical Social Worker:  Butch Penny CROWDER, LCSWA  Date/time:  07/11/2014 04:00 PM  Clinical Social Work is seeking post-discharge placement for this patient at the following level of care:   SKILLED NURSING   (*CSW will update this form in Epic as items are completed)   07/11/2014  Patient/family provided with [place] Department of Clinical Social Work's list of facilities offering this level of care within the geographic area requested by the patient (or if unable, by the patient's family).  07/11/2014  Patient/family informed of their freedom to choose among providers that offer the needed level of care, that participate in Medicare, Medicaid or managed care program needed by the patient, have an available bed and are willing to accept the patient.  07/11/2014  Patient/family informed of [facility]' ownership interest in [facility], as well as of the fact that they are under no obligation to receive care at this facility.  PASARR submitted to EDS on 07/11/2014 PASARR number received on 07/11/2014  FL2 transmitted to all facilities in geographic area requested by pt/family on  07/11/2014 FL2 transmitted to all facilities within larger geographic area on   Patient informed that his/her managed care company has contracts with or will negotiate with  certain facilities, including the following:   Christella Scheuermann-- will require pre-auth     Patient/family informed of bed offers received:  07/14/2014 Patient chooses bed at [facility] Physician recommends and patient chooses bed at  n/a  Patient to be transferred to  [facility], SNF[facility]   Patient to be transferred to facility by [facility]) Patient and family notified of transfer on husband at bedside CSW notified Name of family member notified:   Delfino Lovett, husband  The following physician request were entered in Epic:   Additional Comments: CSW received insurance authorization for [facility].  Pt and husband agreeable.

## 2014-07-14 NOTE — Progress Notes (Signed)
Peripherally Inserted Central Catheter/Midline Placement  The IV Nurse has discussed with the patient and/or persons authorized to consent for the patient, the purpose of this procedure and the potential benefits and risks involved with this procedure.  The benefits include less needle sticks, lab draws from the catheter and patient may be discharged home with the catheter.  Risks include, but not limited to, infection, bleeding, blood clot (thrombus formation), and puncture of an artery; nerve damage and irregular heat beat.  Alternatives to this procedure were also discussed.  PICC/Midline Placement Documentation  PICC / Midline Single Lumen 39/67/28 PICC Right Basilic 34 cm 0 cm (Active)  Indication for Insertion or Continuance of Line Home intravenous therapies (PICC only) 07/14/2014 11:55 AM  Exposed Catheter (cm) 0 cm 07/14/2014 11:55 AM  Site Assessment Clean;Dry;Intact 07/14/2014 11:55 AM  Line Status Flushed;Saline locked;Blood return noted 07/14/2014 11:55 AM  Dressing Change Due 07/21/14 07/14/2014 11:55 AM       [facility] Payment 07/14/2014, 11:56 AM

## 2014-07-14 NOTE — Clinical Social Work Note (Signed)
CSW called [facility] for patient pick up scheduled for 4:30.  Family informed.  Domenica Reamer, [place] Social Worker [phone]

## 2014-07-14 NOTE — Progress Notes (Signed)
[facility] for Infectious Disease    Date of Admission:  07/06/2014    Total days of antibiotics 9        Day 5 Imipenem        Day 4 Linezolid         Active Problems:   Septic shock   CAP (community acquired pneumonia)   Acute respiratory failure with hypoxia   Hyponatremia   . dextromethorphan-guaiFENesin  1 tablet Oral BID  . divalproex  500 mg Oral QHS  . imipenem-cilastatin  250 mg Intravenous 4 times per day  . linezolid  600 mg Intravenous Q12H  . nortriptyline  100 mg Oral QHS  . pantoprazole  40 mg Oral Q1200  . senna  2 tablet Oral QHS  . traMADol  50 mg Oral 4 times per day    Subjective: Says she feel sleepy this morning, does not feel much different from yesterday. Still w/ cough and claims she was somewhat short of breath on room air today.    Past Medical History  Diagnosis Date  . Hypertension     essential hypertension  . Tobacco abuse     smoking about 1/2 pk of cigarettes a day  . Anxiety   . Depression     psychiatrist Dr. Toy Care  . Respiratory infection   . Shortness of breath   . Malaise   . Lightheadedness   . Emphysematous COPD     changes in right base along with patchy areas on last x-ray  . COPD (chronic obstructive pulmonary disease)   . History of echocardiogram 12/24/2006    Est. EF of 08-67% NORMAL LV SYSTOLIC FUNCTION WITH IMPAIRED RELAXATION -- MILD AORTIC SCLEROSIS -- NORMAL PALONARY ARTERY PRESSURE -- NO OLD ECHOS FOR COMPARISON -- [name], MD  . History of cardiovascular stress test 08/15/2004    EF of 70% -- Normal stress cardiolite.  There is no evidence of ischemia and there is normal LV function. -- [name]. MD  . Diastolic dysfunction   . Allergy   . Asthma   . Heart murmur   . Pneumonia     "several times since May 2015" (07/06/2014)  . Chronic bronchitis     "get it alot; maybe not q yr" (07/06/2014)    History  Substance Use Topics  . Smoking status: Current Every Day Smoker -- 1.00  packs/day for 42 years    Types: Cigarettes  . Smokeless tobacco: Never Used  . Alcohol Use: No    Family History  Problem Relation Age of Onset  . Heart disease Mother   . Stroke Mother   . Heart disease Father   . Hyperlipidemia Father   . Hypertension Father   . Cancer Maternal Aunt     Allergies  Allergen Reactions  . Augmentin [Amoxicillin-Pot Clavulanate]   . Ceclor [Cefaclor]     hives  . Escitalopram Oxalate     Pt does not recall ever taking medication  . Penicillins     rash  . Sertraline Hcl   . Sulfa Drugs Cross Reactors     Hives and rash    Objective: Temp:  [98.5 F (36.9 C)-98.7 F (37.1 C)] 98.7 F (37.1 C) (08/14 0459) Pulse Rate:  [98-99] 99 (08/14 0459) Resp:  [20] 20 (08/14 0459) BP: (140-142)/(64-66) 140/66 mmHg (08/14 0459) SpO2:  [96 %-98 %] 97 % (08/14 0459) Weight:  [142 lb 8 oz (64.638 kg)] 142 lb  8 oz (64.638 kg) (08/14 0459)  General: Cooperative, no acute distress. Somnolent. Placed on 2L O2 via nasal cannula HEENT: PERRL, EOMI. Moist mucus membranes. Poor dentition.  Lungs: Diffuse rhonchi. End expiratory wheeze. Bronchial breath sounds in RLL Heart: Regular rate and rhythm, no murmurs.  Abdomen: Soft, non-tender, non-distended, bowel sounds present.  Extremities: No cyanosis, clubbing, or edema. Neurologic: Alert & oriented x3, cranial nerves II-XII intact, strength grossly intact   Lab Results Lab Results  Component Value Date   WBC 8.1 07/12/2014   HGB 12.0 07/12/2014   HCT 36.6 07/12/2014   MCV 94.3 07/12/2014   PLT 234 07/12/2014    Lab Results  Component Value Date   CREATININE 0.52 07/14/2014   BUN 4* 07/14/2014   NA 133* 07/14/2014   K 5.3 07/14/2014   CL 92* 07/14/2014   CO2 32 07/14/2014    Lab Results  Component Value Date   ALT 17 07/08/2014   AST 26 07/08/2014   ALKPHOS 97 07/08/2014   BILITOT 0.4 07/08/2014      Microbiology: Recent Results (from the past 240 hour(s))  CULTURE, BLOOD (ROUTINE X 2)     Status: None    Collection Time    07/06/14 10:40 AM      Result Value Ref Range Status   Specimen Description BLOOD RIGHT FOREARM   Final   Special Requests BOTTLES DRAWN AEROBIC AND ANAEROBIC 5CC   Final   Culture  Setup Time     Final   Value: 07/06/2014 16:03     Performed at [facility]   Culture     Final   Value: NO GROWTH 5 DAYS     Performed at [facility]   Report Status 07/12/2014 FINAL   Final  CULTURE, BLOOD (ROUTINE X 2)     Status: None   Collection Time    07/06/14 11:03 AM      Result Value Ref Range Status   Specimen Description BLOOD LEFT ARM   Final   Special Requests BOTTLES DRAWN AEROBIC AND ANAEROBIC 5CC   Final   Culture  Setup Time     Final   Value: 07/06/2014 16:03     Performed at [facility]   Culture     Final   Value: NO GROWTH 5 DAYS     Performed at [facility]   Report Status 07/12/2014 FINAL   Final  URINE CULTURE     Status: None   Collection Time    07/06/14  3:09 PM      Result Value Ref Range Status   Specimen Description URINE, CATHETERIZED   Final   Special Requests NONE   Final   Culture  Setup Time     Final   Value: 07/06/2014 18:18     Performed at [facility]     Final   Value: NO GROWTH     Performed at [facility]   Culture     Final   Value: NO GROWTH     Performed at [facility]   Report Status 07/07/2014 FINAL   Final  MRSA PCR SCREENING     Status: None   Collection Time    07/06/14  3:33 PM      Result Value Ref Range Status   MRSA by PCR NEGATIVE  NEGATIVE Final   Comment:            The GeneXpert MRSA  Assay (FDA     approved for NASAL specimens     only), is one component of a     comprehensive MRSA colonization     surveillance program. It is not     intended to diagnose MRSA     infection nor to guide or     monitor treatment for     MRSA infections.  CULTURE, EXPECTORATED SPUTUM-ASSESSMENT     Status: None   Collection Time    07/06/14   4:40 PM      Result Value Ref Range Status   Specimen Description SPUTUM   Final   Special Requests NONE   Final   Sputum evaluation     Final   Value: THIS SPECIMEN IS ACCEPTABLE. RESPIRATORY CULTURE REPORT TO FOLLOW.   Report Status 07/06/2014 FINAL   Final  CULTURE, RESPIRATORY (NON-EXPECTORATED)     Status: None   Collection Time    07/06/14  4:40 PM      Result Value Ref Range Status   Specimen Description SPUTUM   Final   Special Requests NONE   Final   Gram Stain     Final   Value: ABUNDANT WBC PRESENT,BOTH PMN AND MONONUCLEAR     MODERATE SQUAMOUS EPITHELIAL CELLS PRESENT     RARE GRAM POSITIVE COCCI IN PAIRS     IN CLUSTERS RARE GRAM POSITIVE RODS     Performed at [facility]   Culture     Final   Value: NORMAL OROPHARYNGEAL FLORA     Performed at [facility]   Report Status 07/09/2014 FINAL   Final  CULTURE, EXPECTORATED SPUTUM-ASSESSMENT     Status: None   Collection Time    07/09/14  9:59 AM      Result Value Ref Range Status   Specimen Description SPUTUM   Final   Special Requests NONE   Final   Sputum evaluation     Final   Value: THIS SPECIMEN IS ACCEPTABLE. RESPIRATORY CULTURE REPORT TO FOLLOW.   Report Status 07/09/2014 FINAL   Final  CULTURE, RESPIRATORY (NON-EXPECTORATED)     Status: None   Collection Time    07/09/14  9:59 AM      Result Value Ref Range Status   Specimen Description SPUTUM   Final   Special Requests NONE   Final   Gram Stain     Final   Value: NO WBC SEEN     FEW SQUAMOUS EPITHELIAL CELLS PRESENT     MODERATE GRAM POSITIVE COCCI     IN PAIRS IN CLUSTERS IN CHAINS RARE GRAM POSITIVE RODS     Performed at [facility]   Culture     Final   Value: NORMAL OROPHARYNGEAL FLORA     Performed at [facility]   Report Status 07/11/2014 FINAL   Final  CULTURE, EXPECTORATED SPUTUM-ASSESSMENT     Status: None   Collection Time    07/12/14  4:45 PM      Result Value Ref Range Status   Specimen Description  SPUTUM   Final   Special Requests NONE   Final   Sputum evaluation     Final   Value: THIS SPECIMEN IS ACCEPTABLE. RESPIRATORY CULTURE REPORT TO FOLLOW.   Report Status 07/12/2014 FINAL   Final  CULTURE, RESPIRATORY (NON-EXPECTORATED)     Status: None   Collection Time    07/12/14  4:45 PM      Result Value Ref Range Status  Specimen Description SPUTUM   Final   Special Requests NONE   Final   Gram Stain     Final   Value: RARE WBC PRESENT,BOTH PMN AND MONONUCLEAR     RARE SQUAMOUS EPITHELIAL CELLS PRESENT     NO ORGANISMS SEEN     Performed at [facility]   Culture     Final   Value: NO GROWTH 1 DAY     Performed at [facility]   Report Status PENDING   Incomplete    Studies/Results: Ct Chest Wo Contrast  07/14/2014   CLINICAL DATA:  Shortness of breath. Pneumonia. Right lung cavitation.  EXAM: CT CHEST WITHOUT CONTRAST  TECHNIQUE: Multidetector CT imaging of the chest was performed following the standard protocol without IV contrast.  COMPARISON:  07/06/2014  FINDINGS: Right lower lobe consolidation is again demonstrated with associated air bronchograms and mild to moderate bronchiectasis. A rounded area of parenchymal cavitation is seen within the right upper lobe consolidation which measures approximately 5 x 6 cm on image 33 of series 2. This shows no significant change in size but does show mild increase in cavitation since prior exam. Differential diagnosis remains cavitary pneumonia versus cavitary neoplasm. Small pleural effusions are seen bilaterally which arm mildly increased since previous exam.  Patchy airspace disease is seen within the anterior upper lobes bilaterally in the right middle lobe, which is increased since previous study. This is most consistent with pneumonia. No other discrete pulmonary nodules or masses are identified. Mild emphysema again demonstrated.  No evidence of central endobronchial obstruction. Mild mediastinal lymphadenopathy is again  seen in the right paratracheal and subcarinal regions measuring 1.4 and 1.6 cm respectively. The shows no significant change. No other sites of lymphadenopathy identified.  IMPRESSION: Persistent dense right lower lobe consolidation with 6 cm rounded area showing increased parenchymal cavitation. Differential diagnosis remains cavitary pneumonia versus cavitary neoplasm.  Increased airspace disease in both upper lobes and right middle lobe, consistent with pneumonia.  Increased small bilateral pleural effusions.  Stable mild mediastinal lymphadenopathy.   Electronically Signed   By: Earle Gell M.D.   On: 07/14/2014 07:27   Dg Chest Port 1 View  07/13/2014   CLINICAL DATA:  Shortness of breath, followup pneumonia  EXAM: PORTABLE CHEST - 1 VIEW  COMPARISON:  Portable chest x-ray of 812 and 07/10/2014  FINDINGS: There is little change in pleural and parenchymal opacity at the right lung base with probable effusion. Pneumonia is the primary consideration. As noted on recent chest x-rays, an area of cavitation cannot be excluded within this area of consolidation. The left lung is clear. Heart size is stable.  IMPRESSION: No significant change in opacity at the right lung base most consistent with pneumonia and effusion. Cannot exclude an area of cavitation as noted previously.   Electronically Signed   By: Ivar Drape M.D.   On: 07/13/2014 08:12    Assessment: 63 y/o F w/ history of COPD and recent recurrent RLL pneumonia since 03/2014, treated w/ multiple courses of antibiotics including levofloxacin, azithromycin, and doxycycline prior to admission on 07/06/14 for septic shock secondary to RLL pneumonia. Initially treated w/ Aztreonam, Vancomycin and Levaquin, now on Linezolid and Imipenem. CT repeated yesterday, still shows significant RLL infiltrate w/ larger area of cavitation. All cultures continue to be negative. Suspect that patient's improvement w/ Imipenem may be secondary to anaerobic coverage, however,  still cannot rule out possibility of staph as well. Patient is likely being discharged to a  SNF and should be continued on IV antibiotic coverage for total of 14 days Imipenem.  Plan: 1. PICC placement for continued outpatient antibiotic therapy 2. Continue Imipenem + Linezolid for another 9 days (total of 14 days of Imipenem therapy)  Luanne Bras, MD PGY-2, Internal Medicine Pager: [phone] 07/14/2014, 9:49 AM  Attending addendum: She is improving slowly. Her cough is little better and is no longer as productive as it was. She has not had any recent hemoptysis. Her appetite is improving. 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 She is improving on her current therapy and I would plan on continuing her current antibiotics for 10-14 more days. Please call Dr. Talbot Grumbling [phone] for any infectious disease questions this weekend.  Michel Bickers, MD [facility] for [facility] [phone] pager   [phone] cell 07/14/2014, 2:18 PM

## 2014-07-14 NOTE — Clinical Social Work Psychosocial (Addendum)
Clinical Social Work Department BRIEF PSYCHOSOCIAL ASSESSMENT 07/11/2014  Patient:  Catherine Kelley, Catherine Kelley     Account Number:  [id number]     Admit date:  07/06/2014  Clinical Social Worker:  Iona Coach  Date/Time:  07/11/2014 09:45 PM  Referred by:  Physician  Date Referred:  07/11/2014 Referred for  SNF Placement   Other Referral:   Interview type:  Other - See comment Other interview type:   Patient and one of her daughters-    PSYCHOSOCIAL DATA Living Status:  HUSBAND Admitted from facility:   Level of care:   Primary support name:  Catherine Kelley [phone] Primary support relationship to patient:  SPOUSE Degree of support available:   Strong support    Daughters:  Catherine Kelley  [phone]  [place] [address]    CURRENT CONCERNS Current Concerns  Post-Acute Placement   Other Concerns:    SOCIAL WORK ASSESSMENT / PLAN 63 year old female referred to CSW for short term SNF per recommendation of Physical Therapy. CSW met with patient and one of her daughters this mornign to discuss SNF placement process. Patient lives at home with her husband but he works full time. Patient agrees that she would benefit from short term care.  Discussed SNF process and patient/family prefer either [place] or [place] area. Her husband works in [employer]. Active bed search will be initiated; Fl2 completed and placed on chart for MD's signature.   Assessment/plan status:  Psychosocial Support/Ongoing Assessment of Needs Other assessment/ plan:   Information/referral to community resources:   SNF bed list provided to patient and daugher    PATIENT'S/FAMILY'S RESPONSE TO PLAN OF CARE: Patient is alert, oriented and pleasant. She states that she is willing to seek SNF placement but is also anxious to return home.  Cuirrently would have to be alone during the day as he husband works.  Daughter agreed to SNF plan and will discuss further with her father and sister.   Patient denied any questions or concerns.  Daughter stated that patient not be placed at one particular [facility] as they had a bad experience with her grandmother. Patient agreed with this.  Report will be left for CSW who will manage patient on an ongoing basis.

## 2014-07-15 LAB — CULTURE, RESPIRATORY W GRAM STAIN

## 2014-07-15 LAB — CULTURE, RESPIRATORY: CULTURE: NORMAL

## 2014-07-31 NOTE — H&P (Signed)
Dr. Brand Males, M.D., [facility].C.P Pulmonary and Critical Care Medicine Staff Physician [facility] Pager: [phone], If no answer or between  15:00h - 7:00h: call [phone]  07/31/2014 9:11 PM

## 2014-08-03 ENCOUNTER — Ambulatory Visit: Payer: Managed Care, Other (non HMO)

## 2014-08-03 ENCOUNTER — Encounter: Payer: Self-pay | Admitting: Emergency Medicine

## 2014-08-03 ENCOUNTER — Ambulatory Visit (INDEPENDENT_AMBULATORY_CARE_PROVIDER_SITE_OTHER)
Admission: RE | Admit: 2014-08-03 | Discharge: 2014-08-03 | Disposition: A | Discharge status: Home / Self Care | Payer: Managed Care, Other (non HMO) | Source: Ambulatory Visit | Attending: Internal Medicine | Admitting: Internal Medicine

## 2014-08-03 ENCOUNTER — Ambulatory Visit (INDEPENDENT_AMBULATORY_CARE_PROVIDER_SITE_OTHER): Payer: Managed Care, Other (non HMO) | Admitting: Internal Medicine

## 2014-08-03 ENCOUNTER — Encounter: Payer: Self-pay | Admitting: Internal Medicine

## 2014-08-03 VITALS — BP 102/70 | HR 85 | Ht 63.0 in | Wt 135.0 lb

## 2014-08-03 DIAGNOSIS — J189 Pneumonia, unspecified organism: Secondary | ICD-10-CM

## 2014-08-03 NOTE — Progress Notes (Signed)
Subjective:    Patient ID: Catherine Kelley, female    DOB: 01/27/1951, 63 y.o.   MRN: [id number]  HPI   OV 08/03/2014  Chief Complaint  Patient presents with  . Follow-up    HFU- Pt recently d/c from [facility] for CAP.      63 year old previously healthy female with a smoking history. She was admitted early to mid August 2015 with right lower lobe community acquired pneumonia with septic shock. Review of records suggest that she was not intubated but she is coughing up purulent sputum during the course of the stay. Extensive etiologic workup was negative for specific etiology. Says since that culture negative community-acquired right lower lobe pneumonia with necrotizing features. In sputum had mixed flora Patient herself tells me today that she had symptoms dating back to June 2015 and it failed multiple courses of antibiotics. Prior to this she was apparently healthy. She was discharged to a rehabilitation facility 07/14/2014 with advice to continue IV imipenem and linezolid for 9 days. However today along that she still with a PICC line and still on IV antibiotics. She has no sputum. She was using a walker but she is gaining strength and today is the first day without a walker. It is unclear what her discharge plan from the nursing home is. Otherwise she is feeling better according to her and her husband. Her appetite is slowly returning.  I do note that she has not had HIV or autoimmune workup  She feels she is ready to be dc'd out of SNF. Wants to know if she can stand and work in [employer] all day. Lookng at short term disabiliuty  Pmhx   has a past medical history of Hypertension; Tobacco abuse; Anxiety; Depression; Respiratory infection; Shortness of breath; Malaise; Lightheadedness; Emphysematous COPD; COPD (chronic obstructive pulmonary disease); History of echocardiogram (12/24/2006); History of cardiovascular stress test (08/15/2004); Diastolic dysfunction; Allergy; Asthma; Heart murmur;  Pneumonia; and Chronic bronchitis.   has past surgical history that includes Tubal ligation (1986).   Social  reports that she quit smoking about 4 weeks ago. Her smoking use included Cigarettes. She has a 42 pack-year smoking history. She has never used smokeless tobacco.   No results found.     Review of Systems  Constitutional: Negative for fever and unexpected weight change.  HENT: Positive for congestion. Negative for dental problem, ear pain, nosebleeds, postnasal drip, rhinorrhea, sinus pressure, sneezing, sore throat and trouble swallowing.   Eyes: Negative for redness and itching.  Respiratory: Positive for cough and shortness of breath. Negative for chest tightness and wheezing.   Cardiovascular: Positive for chest pain. Negative for palpitations and leg swelling.  Gastrointestinal: Positive for nausea. Negative for vomiting.  Genitourinary: Negative for dysuria.  Musculoskeletal: Negative for joint swelling.  Skin: Negative for rash.  Neurological: Negative for headaches.  Hematological: Does not bruise/bleed easily.  Psychiatric/Behavioral: Negative for dysphoric mood. The patient is not nervous/anxious.        Objective:   Physical Exam  Vitals reviewed. Constitutional: She is oriented to person, place, and time. She appears well-developed and well-nourished. No distress.  Body mass index is 23.92 kg/(m^2). Looks well compared to her hx STill some what deconditioned  HENT:  Head: Normocephalic and atraumatic.  Right Ear: External ear normal.  Left Ear: External ear normal.  Mouth/Throat: Oropharynx is clear and moist. No oropharyngeal exudate.  Eyes: Conjunctivae and EOM are normal. Pupils are equal, round, and reactive to light. Right eye exhibits no discharge. Left  eye exhibits no discharge. No scleral icterus.  Neck: Normal range of motion. Neck supple. No JVD present. No tracheal deviation present. No thyromegaly present.  Cardiovascular: Normal rate,  regular rhythm, normal heart sounds and intact distal pulses.  Exam reveals no gallop and no friction rub.   No murmur heard. Pulmonary/Chest: Effort normal. No respiratory distress. She has no wheezes. She has rales. She exhibits no tenderness.  Abdominal: Soft. Bowel sounds are normal. She exhibits no distension and no mass. There is no tenderness. There is no rebound and no guarding.  Musculoskeletal: Normal range of motion. She exhibits no edema and no tenderness.  Lymphadenopathy:    She has no cervical adenopathy.  Neurological: She is alert and oriented to person, place, and time. She has normal reflexes. No cranial nerve deficit. She exhibits normal muscle tone. Coordination normal.  Skin: Skin is warm and dry. No rash noted. She is not diaphoretic. No erythema. No pallor.  Psychiatric: She has a normal mood and affect. Her behavior is normal. Judgment and thought content normal.    Filed Vitals:   08/03/14 1556  BP: 102/70  Pulse: 85   Filed Vitals:   08/03/14 1556  BP: 102/70  Pulse: 85  Height: 5\' 3"  (1.6 m)  Weight: 135 lb (61.236 kg)  SpO2: 96%         Assessment & Plan:  #Right lower Lobe necrotizing CAP - Aug 2015 Clinically and radiologically you are better REmove PICC line by nursing home Stop both antibiotics by nursing home Do blood test today for unusual causes of pneumonia; will call with results  - HIV, ANA, ANCA screen, ANCA antibody, RF, CCP, SCl-70 Do not work for next 8 weeks atleast till all strenght return  Followup  5 wweeks with NP Tammy CXR 2 view at followup  > 50% of this > 25 min visit spent in face to face counseling (15 min visit converted to 25 min)

## 2014-08-03 NOTE — Patient Instructions (Addendum)
#  Right lower Lobe necrotizing CAP - Aug 2015 Clinically and radiologically you are better REmove PICC line by nursing home Stop both antibiotics by nursing home Do blood test today for unusual causes of pneumonia; will call with results  - HIV, ANA, ANCA screen, ANCA antibody, RF, CCP, SCl-70 Do not work for next 8 weeks atleast till all strenght returtnr  Followup  5 wweeks with NP Tammy CXR 2 view at followup

## 2014-08-05 ENCOUNTER — Other Ambulatory Visit: Payer: Self-pay | Admitting: Cardiology

## 2014-08-09 LAB — RHEUMATOID FACTOR: Rhuematoid fact SerPl-aCnc: <10 IU/mL (ref <=14)

## 2014-08-09 LAB — CYCLIC CITRUL PEPTIDE ANTIBODY, IGG

## 2014-08-09 LAB — ANA: Anti Nuclear Antibody(ANA): POSITIVE — AB

## 2014-08-09 LAB — MPO/PR-3 (ANCA) ANTIBODIES
Myeloperoxidase Abs: <1
Serine Protease 3: <1

## 2014-08-09 LAB — ANTI-NUCLEAR AB-TITER (ANA TITER): ANA Titer 1: 1:80 {titer} — ABNORMAL HIGH

## 2014-08-09 LAB — HIV ANTIBODY (ROUTINE TESTING W REFLEX): HIV: NONREACTIVE

## 2014-08-09 LAB — ANTI-SCLERODERMA ANTIBODY: Scleroderma (Scl-70) (ENA) Antibody, IgG: <1

## 2014-08-10 NOTE — Progress Notes (Signed)
Quick Note:  Called and spoke to pt. Informed pt of results per MR. Pt verbalized understanding and denied any further questions or concerns at this time. ______ 

## 2014-08-11 ENCOUNTER — Telehealth: Payer: Self-pay

## 2014-08-11 NOTE — Telephone Encounter (Signed)
Pt of Dr.Smith says she has pulled something in her back, and would like to know if  Dr. Tamala Julian would prescribe her something for pain. I advised pt that she might need to come in and be evaluated, but she wanted to hear back from Dr. Tamala Julian first.

## 2014-08-11 NOTE — Telephone Encounter (Signed)
Pt advised to RTC as soon as she can to see a provider and be evaluated. Pt states that she has talked to Dr. Tamala Julian about this in the past. She is slightly disappointed that Dr. Tamala Julian will not be in until next week. Since she is familiar with her sciatic problems would Dr. Tamala Julian be willing to call something into her pharmacy?

## 2014-08-11 NOTE — Telephone Encounter (Signed)
Call patient --- I see she had an extensive hospitalization that required inpatient rehab.  I do not recall evaluating patient for lower back pain at recent visits; pt may have mentioned recurrent back pain but I have never evaluated her for lower back pain.  Needs office visit especially considering recent significant illness requiring prolonged hospitalization.  I see that pulmonology recommended her not working for two more months; I am happy to schedule her an appointment with me in the upcoming two weeks.

## 2014-08-16 NOTE — Telephone Encounter (Signed)
Pt states she will call back to schedule an appt.

## 2014-08-16 NOTE — Progress Notes (Signed)
Quick Note:  Called and spoke to pt. Informed pt of results and recs per MR. Pt verbalized understanding and denied any further questions or concerns at this time. Pt aware of upcoming appt and of CXR. ______

## 2014-08-17 NOTE — Assessment & Plan Note (Addendum)
#  Right lower Lobe necrotizing CAP - Aug 2015 Clinically and radiologically you are better REmove PICC line by nursing home Stop both antibiotics by nursing home Do blood test today for unusual causes of pneumonia; will call with results  - HIV, ANA, ANCA screen, ANCA antibody, RF, CCP, SCl-70 Do not work for next 8 weeks atleast till all strenght return  Followup  5 wweeks with NP Tammy CXR 2 view at followup   > 50% of this > 25 min visit spent in face to face counseling (15 min visit converted to 25 min)

## 2014-09-06 ENCOUNTER — Encounter: Payer: Self-pay | Admitting: Adult Health

## 2014-09-06 ENCOUNTER — Ambulatory Visit (INDEPENDENT_AMBULATORY_CARE_PROVIDER_SITE_OTHER): Payer: Managed Care, Other (non HMO) | Admitting: Adult Health

## 2014-09-06 ENCOUNTER — Ambulatory Visit (INDEPENDENT_AMBULATORY_CARE_PROVIDER_SITE_OTHER)
Admission: RE | Admit: 2014-09-06 | Discharge: 2014-09-06 | Disposition: A | Discharge status: Home / Self Care | Payer: Managed Care, Other (non HMO) | Source: Ambulatory Visit | Attending: Adult Health | Admitting: Adult Health

## 2014-09-06 VITALS — BP 136/82 | HR 73 | Temp 97.4°F | Ht 63.0 in | Wt 136.8 lb

## 2014-09-06 DIAGNOSIS — J189 Pneumonia, unspecified organism: Secondary | ICD-10-CM

## 2014-09-06 DIAGNOSIS — Z23 Encounter for immunization: Secondary | ICD-10-CM

## 2014-09-06 NOTE — Patient Instructions (Signed)
Continue on current regimen. Continue to work on not smoking. Flu shot today Followup in 4 weeks with a chest x-ray and pulmonary function tests with Dr. Chase Caller

## 2014-09-06 NOTE — Progress Notes (Signed)
Subjective:    Patient ID: Catherine Kelley, female    DOB: 1951-02-15, 63 y.o.   MRN: [id number]  HPI   OV 08/03/2014  Chief Complaint  Patient presents with  . Follow-up    HFU- Pt recently d/c from [facility] for CAP.    63 year old previously healthy female with a smoking history. She was admitted early to mid August 2015 with right lower lobe community acquired pneumonia with septic shock. Review of records suggest that she was not intubated but she is coughing up purulent sputum during the course of the stay. Extensive etiologic workup was negative for specific etiology. Says since that culture negative community-acquired right lower lobe pneumonia with necrotizing features. In sputum had mixed flora Patient herself tells me today that she had symptoms dating back to June 2015 and it failed multiple courses of antibiotics. Prior to this she was apparently healthy. She was discharged to a rehabilitation facility 07/14/2014 with advice to continue IV imipenem and linezolid for 9 days. However today along that she still with a PICC line and still on IV antibiotics. She has no sputum. She was using a walker but she is gaining strength and today is the first day without a walker. It is unclear what her discharge plan from the nursing home is. Otherwise she is feeling better according to her and her husband. Her appetite is slowly returning.  I do note that she has not had HIV or autoimmune workup  She feels she is ready to be dc'd out of SNF. Wants to know if she can stand and work in [employer] all day. Lookng at short term disabiliuty  09/06/2014 Follow up  Returns for a 1 month  follow up for Pneumonia  Pt has had a RLL cavitary PNA complicated by septic shock w/ hospitalization in 07/2014  She was discharged on prolonged IV abx w/ Primaxin, Zyvox x 21 days.  Seen last ov w/ d/c of abx . Labs showed autoimmune and HIV tests were neg. (ANA tr positive -not felt to be clinically significant )  Was discharged  to rehab briefly and now back on home  Out of work since discharge, still feels weak .  Quit smoking after discharge , but admits she cheated. But is going to get nicotine patch to help her quit.  Discussed smoking cessation.  Reports breathing has been doing well since last ov, no new complaints.     Pmhx   has a past medical history of Hypertension; Tobacco abuse; Anxiety; Depression; Respiratory infection; Shortness of breath; Malaise; Lightheadedness; Emphysematous COPD; COPD (chronic obstructive pulmonary disease); History of echocardiogram (12/24/2006); History of cardiovascular stress test (08/15/2004); Diastolic dysfunction; Allergy; Asthma; Heart murmur; Pneumonia; and Chronic bronchitis.   has past surgical history that includes Tubal ligation (1986).   Social  reports that she quit smoking about 4 weeks ago. Her smoking use included Cigarettes. She has a 42 pack-year smoking history. She has never used smokeless tobacco.   No results found.     Review of Systems  Constitutional:   No  weight loss, night sweats,  Fevers, chills, fatigue, or  lassitude.  HEENT:   No headaches,  Difficulty swallowing,  Tooth/dental problems, or  Sore throat,                No sneezing, itching, ear ache, nasal congestion, post nasal drip,   CV:  No chest pain,  Orthopnea, PND, swelling in lower extremities, anasarca, dizziness, palpitations, syncope.   GI  No heartburn, indigestion, abdominal pain, nausea, vomiting, diarrhea, change in bowel habits, loss of appetite, bloody stools.   Resp: No shortness of breath with exertion or at rest.  No excess mucus, no productive cough,  No non-productive cough,  No coughing up of blood.  No change in color of mucus.  No wheezing.  No chest wall deformity  Skin: no rash or lesions.  GU: no dysuria, change in color of urine, no urgency or frequency.  No flank pain, no hematuria   MS:  No joint pain or swelling.  No decreased range of motion.  No back  pain.  Psych:  No change in mood or affect. No depression or anxiety.  No memory loss.            Objective:   Physical Exam  GEN: A/Ox3; pleasant , NAD, well nourished   HEENT:  [place]/AT,  EACs-clear, TMs-wnl, NOSE-clear, THROAT-clear, no lesions, no postnasal drip or exudate noted.  Poor dentition   NECK:  Supple w/ fair ROM; no JVD; normal carotid impulses w/o bruits; no thyromegaly or nodules palpated; no lymphadenopathy.  RESP  RLL rales , no wheezing .no accessory muscle use, no dullness to percussion  CARD:  RRR, no m/r/g  , no peripheral edema, pulses intact, no cyanosis or clubbing.  GI:   Soft & nt; nml bowel sounds; no organomegaly or masses detected.  Musco: Warm bil, no deformities or joint swelling noted.   Neuro: alert, no focal deficits noted.    Skin: Warm, no lesions or rashes   CXR 09/06/2014  Chest x-ray shows continued improvement in appearance of the right lower lobe pneumonia

## 2014-09-06 NOTE — Assessment & Plan Note (Signed)
RLL cavitary PNA requiring prolonged IV abx  Autoimmune and HIV neg  Clinically improving w/ clearing area on CXR  She is a smoker and CXR is not completely clear so will need serial cxr  If not completely cleared on return in 4 weeks , consider CT chest  Obtain PFT on return to evaluate for underlying COPD   Plan  Continue on current regimen. Continue to work on not smoking. Flu shot today Followup in 4 weeks with a chest x-ray and pulmonary function tests with Dr. Chase Caller

## 2014-09-28 ENCOUNTER — Ambulatory Visit (INDEPENDENT_AMBULATORY_CARE_PROVIDER_SITE_OTHER): Payer: Managed Care, Other (non HMO)

## 2014-09-28 ENCOUNTER — Ambulatory Visit (INDEPENDENT_AMBULATORY_CARE_PROVIDER_SITE_OTHER): Payer: Managed Care, Other (non HMO) | Admitting: Family Medicine

## 2014-09-28 VITALS — BP 108/64 | HR 89 | Temp 97.9°F | Resp 16 | Ht 63.0 in | Wt 141.4 lb

## 2014-09-28 DIAGNOSIS — Z23 Encounter for immunization: Secondary | ICD-10-CM

## 2014-09-28 DIAGNOSIS — M5416 Radiculopathy, lumbar region: Secondary | ICD-10-CM

## 2014-09-28 DIAGNOSIS — M5417 Radiculopathy, lumbosacral region: Secondary | ICD-10-CM

## 2014-09-28 MED ORDER — HYDROCODONE-ACETAMINOPHEN 5-325 MG PO TABS: 1.0000 | ORAL_TABLET | Freq: Three times a day (TID) | ORAL | Status: DC | PRN

## 2014-09-28 MED ORDER — PREDNISONE 10 MG PO TABS: ORAL_TABLET | ORAL | Status: DC

## 2014-09-28 MED ORDER — CYCLOBENZAPRINE HCL 5 MG PO TABS: 5.0000 mg | ORAL_TABLET | Freq: Three times a day (TID) | ORAL | Status: DC | PRN

## 2014-09-28 NOTE — Progress Notes (Signed)
Catherine Kelley - 63 y.o. female MRN [id number]  Date of birth: 1951/03/05  SUBJECTIVE:  Including CC & ROS.  patient C/O: low back pain Onset of symptoms: 2 days of acute flare with a chronic history of scatica and "pinch nerves in the back" Symptoms:  patient c/o lumbar back pain with radiation of pain described as a sharp shooting pain down her right leg.  Relieving factors: patient tried tramadol 25mg  this morning with no relief.  Worsened by: stand, sitting for long periods of time   ROS:  Constitutional:  No fever, chills, or fatigue.  Respiratory:  No shortness of breath, mild cough, or wheezing Cardiovascular:  No palpitations, chest pain or syncope Gastrointestinal:  No nausea, no abdominal pain Review of systems otherwise negative except for what is stated in HPI  HISTORY: Past Medical, Surgical, Social, and Family History Reviewed & Updated per EMR. Pertinent Historical Findings include: Patient had a recent several month intermittent acquired pneumonia resulted in sepsis and admission to the hospital back in June 2015. Patient acquired a long-term IV antibiotics, local prolonged hospitalization, and transition to a skilled nursing facility. Patient's been out of the nursing facility for only 2 or 3 weeks reports she has returned to work with no significant difficulties the patient is being followed by pulmonology. Who plans to repeat her chest x-ray in 4 weeks as well as pulmonary function testing   PHYSICAL EXAM:  VS: BP:108/64 mmHg  HR:89bpm  TEMP:97.9 F (36.6 C)(Oral)  RESP:95 %  HT:5\' 3"  (160 cm)   WT:141 lb 6.4 oz (64.139 kg)  BMI:25.1 LOW BACK EXAM: General: well nourished Skin of LE: warm; dry, no rashes, lesions, ecchymosis or erythema. Vascular: radial pulses 2+ bilaterally Neurologically: Sensation to light touch lower extremities equal and intact bilaterally.  Observation: Normal curvature and no kyphosis or lordosis, no scoliosis.  Iliac crests are symmetric,  shoulders line symmetrically Palpation:  No step off defects noted in the thoracic or lumbar spine.   Mild muscle spasm and tenderness along the paraspinal musculature of the thoracic No muscle spasm or tenderness along the paraspinal musculature of the lumbar spine. Range of motion:  Normal flexion on forward bending, no pain with extension, no pain with one leg hyperextension. Neuromuscular: Yes pain and radiculopathy with straight leg raise right negative on the left, normal gait walking with walking on heels or walking on toes, normal tandem gait.  Nerve root intervention  L2 and L3: Normal hip flexion with no weakness. L2, L3, L4: Normal hip abduction bilaterally.  Normal patellar DTR +3 bilaterally L4, L5, S1: Normal hip abduction bilaterally S1 and S2: Normal ankle plantar-flexion bilaterally.  Normal Achilles tendon DTR +3 bilaterally L5: Normal extensor hallucis longus bilaterally  Lumbar 2 view Xray:  Moderate to severe osteoarthritis of the lumbar spine with joint space narrowing between L4-L5 and L5-S1. Degenerative changes within the facet joints.  ASSESSMENT & PLAN:  Patient 63 year old female who presents today with lumbar back pain with radiculopathy down the right leg. She's had symptoms for this off and on for many years and is currently having acute flare. Patient is not having any current symptoms or discomfort in her chest from her pneumonia plans followup with pulmonology in 2 more weeks. Concerning her back pain I recommended treating with a course of prednisone tapering over the next 10 days, muscle relaxers when necessary for back spasm particularly for taking at bedtime, and a small amounts of hydrocodone for breakthrough pain. Advised patient that if her  back pain is not controlled by medications that I would recommend pursuing MRI of her lumbar spine to evaluate the severity of her DJD potential for disc herniation. Patient was agreeable this plan and will followup when  necessary with Dr. Tamala Julian

## 2014-09-29 NOTE — Addendum Note (Signed)
Addended by: Wardell Honour on: 09/29/2014 09:09 AM   Modules accepted: Level of Service

## 2014-09-29 NOTE — Progress Notes (Signed)
History and physical examinations reviewed in detail with Dr. Ollen Barges.  Lumbar spine films reviewed. Agree with assessment and plan.

## 2014-10-03 ENCOUNTER — Ambulatory Visit: Payer: Managed Care, Other (non HMO) | Admitting: Pulmonary Disease

## 2014-10-03 ENCOUNTER — Telehealth: Payer: Self-pay | Admitting: Internal Medicine

## 2014-10-03 NOTE — Telephone Encounter (Signed)
Called pt. She c/o prod cough-grey/tan color phlem, PND, tickle back of throat. Pt scheduled to see BQ this afternoon at 1:30. Nothing further needed

## 2014-10-05 ENCOUNTER — Ambulatory Visit (INDEPENDENT_AMBULATORY_CARE_PROVIDER_SITE_OTHER)
Admission: RE | Admit: 2014-10-05 | Discharge: 2014-10-05 | Disposition: A | Discharge status: Home / Self Care | Payer: Managed Care, Other (non HMO) | Source: Ambulatory Visit | Attending: Adult Health | Admitting: Adult Health

## 2014-10-05 ENCOUNTER — Encounter: Payer: Self-pay | Admitting: Adult Health

## 2014-10-05 ENCOUNTER — Ambulatory Visit (INDEPENDENT_AMBULATORY_CARE_PROVIDER_SITE_OTHER): Payer: Managed Care, Other (non HMO) | Admitting: Adult Health

## 2014-10-05 VITALS — BP 124/68 | HR 88 | Temp 97.1°F | Ht 63.0 in | Wt 142.8 lb

## 2014-10-05 DIAGNOSIS — J189 Pneumonia, unspecified organism: Secondary | ICD-10-CM

## 2014-10-05 MED ORDER — LEVOFLOXACIN 750 MG PO TABS: 750.0000 mg | ORAL_TABLET | Freq: Every day | ORAL | Status: DC

## 2014-10-05 NOTE — Patient Instructions (Addendum)
Begin Levaquin 750mg  daily for 7 days -take with food, eat yogurt.  Mucinex DM Twice daily  .As needed  Cough/congestion  Fluids and rest  Follow up 1 week with chest xray as planned with Dr. Chase Caller  Please contact office for sooner follow up if symptoms do not improve or worsen or seek emergency care

## 2014-10-05 NOTE — Progress Notes (Signed)
Subjective:    Patient ID: Catherine Kelley, female    DOB: Sep 04, 1951, 63 y.o.   MRN: [id number]  HPI   OV 08/03/2014  Chief Complaint  Patient presents with  . Follow-up    HFU- Pt recently d/c from [facility] for CAP.    63 year old previously healthy female with a smoking history. She was admitted early to mid August 2015 with right lower lobe community acquired pneumonia with septic shock. Review of records suggest that she was not intubated but she is coughing up purulent sputum during the course of the stay. Extensive etiologic workup was negative for specific etiology. Says since that culture negative community-acquired right lower lobe pneumonia with necrotizing features. In sputum had mixed flora Patient herself tells me today that she had symptoms dating back to June 2015 and it failed multiple courses of antibiotics. Prior to this she was apparently healthy. She was discharged to a rehabilitation facility 07/14/2014 with advice to continue IV imipenem and linezolid for 9 days. However today along that she still with a PICC line and still on IV antibiotics. She has no sputum. She was using a walker but she is gaining strength and today is the first day without a walker. It is unclear what her discharge plan from the nursing home is. Otherwise she is feeling better according to her and her husband. Her appetite is slowly returning.  I do note that she has not had HIV or autoimmune workup  She feels she is ready to be dc'd out of SNF. Wants to know if she can stand and work in [employer] all day. Lookng at short term disabiliuty  09/06/2014 Follow up  Returns for a 1 month  follow up for Pneumonia  Pt has had a RLL cavitary PNA complicated by septic shock w/ hospitalization in 07/2014  She was discharged on prolonged IV abx w/ Primaxin, Zyvox x 21 days.  Seen last ov w/ d/c of abx . Labs showed autoimmune and HIV tests were neg. (ANA tr positive -not felt to be clinically significant )  Was discharged  to rehab briefly and now back on home  Out of work since discharge, still feels weak .  Quit smoking after discharge , but admits she cheated. But is going to get nicotine patch to help her quit.  Discussed smoking cessation.  Reports breathing has been doing well since last ov, no new complaints.    10/05/2014 Acute OV  Complains of sore throat, prod cough with "thick and gewy" off-white mucus x1 week.  On pred taper per PCP for lumbar back pain (10/29) also started on flexeril and vicodin .  She has been doing better until last week .  CXR today shows new right sided densities .  No fever , chest pain, orthopnea, edema or n/v.  Appetite is good .  No trouble swallowing.  Still smoking , advised on smoking cessation       Pmhx   has a past medical history of Hypertension; Tobacco abuse; Anxiety; Depression; Respiratory infection; Shortness of breath; Malaise; Lightheadedness; Emphysematous COPD; COPD (chronic obstructive pulmonary disease); History of echocardiogram (12/24/2006); History of cardiovascular stress test (08/15/2004); Diastolic dysfunction; Allergy; Asthma; Heart murmur; Pneumonia; and Chronic bronchitis.   has past surgical history that includes Tubal ligation (1986).   Social  reports that she quit smoking about 4 weeks ago. Her smoking use included Cigarettes. She has a 42 pack-year smoking history. She has never used smokeless tobacco.   No results found.  Review of Systems  Constitutional:   No  weight loss, night sweats,  Fevers, chills, fatigue, or  lassitude.  HEENT:   No headaches,  Difficulty swallowing,  Tooth/dental problems, or  Sore throat,                No sneezing, itching, ear ache,  +nasal congestion, post nasal drip,   CV:  No chest pain,  Orthopnea, PND, swelling in lower extremities, anasarca, dizziness, palpitations, syncope.   GI  No heartburn, indigestion, abdominal pain, nausea, vomiting, diarrhea, change in bowel habits, loss of  appetite, bloody stools.   Resp:  No chest wall deformity  Skin: no rash or lesions.  GU: no dysuria, change in color of urine, no urgency or frequency.  No flank pain, no hematuria   MS:  No joint pain or swelling.  No decreased range of motion.  No back pain.  Psych:  No change in mood or affect. No depression or anxiety.  No memory loss.            Objective:   Physical Exam  GEN: A/Ox3; pleasant , NAD, well nourished   HEENT:  [place]/AT,  EACs-clear, TMs-wnl, NOSE-clear, THROAT-clear, no lesions, no postnasal drip or exudate noted.  Poor dentition   NECK:  Supple w/ fair ROM; no JVD; normal carotid impulses w/o bruits; no thyromegaly or nodules palpated; no lymphadenopathy.  RESP  CTA , no wheezing or rales  .no accessory muscle use, no dullness to percussion  CARD:  RRR, no m/r/g  , no peripheral edema, pulses intact, no cyanosis or clubbing.  GI:   Soft & nt; nml bowel sounds; no organomegaly or masses detected.  Musco: Warm bil, no deformities or joint swelling noted.   Neuro: alert, no focal deficits noted.    Skin: Warm, no lesions or rashes   CXR 09/06/2014  Chest x-ray shows continued improvement in appearance of the right lower lobe pneumonia  10/05/2014 CXR There are chronic parenchymal densities in the right lower chest. There are new faint densities in the mid and right lower lung. Left lung remains clear. Heart size is stable. The trachea is midline.

## 2014-10-07 ENCOUNTER — Telehealth: Payer: Self-pay | Admitting: Pulmonary Disease

## 2014-10-07 NOTE — Telephone Encounter (Signed)
Pt is c/o audible wheezing at night when she sleeps, but is not congested nor is she having any sob.  No fever, and is finishing up her abx from last week.  I have re-assured her this is probably upper airway in origin, and she is to call if increased sob or fever.

## 2014-10-10 ENCOUNTER — Other Ambulatory Visit: Payer: Self-pay | Admitting: Internal Medicine

## 2014-10-10 DIAGNOSIS — R0600 Dyspnea, unspecified: Secondary | ICD-10-CM

## 2014-10-11 ENCOUNTER — Ambulatory Visit (INDEPENDENT_AMBULATORY_CARE_PROVIDER_SITE_OTHER): Payer: Managed Care, Other (non HMO) | Admitting: Adult Health

## 2014-10-11 ENCOUNTER — Ambulatory Visit (INDEPENDENT_AMBULATORY_CARE_PROVIDER_SITE_OTHER): Payer: Managed Care, Other (non HMO) | Admitting: Internal Medicine

## 2014-10-11 ENCOUNTER — Encounter: Payer: Self-pay | Admitting: Adult Health

## 2014-10-11 ENCOUNTER — Ambulatory Visit: Payer: Managed Care, Other (non HMO) | Admitting: Internal Medicine

## 2014-10-11 ENCOUNTER — Ambulatory Visit (INDEPENDENT_AMBULATORY_CARE_PROVIDER_SITE_OTHER)
Admission: RE | Admit: 2014-10-11 | Discharge: 2014-10-11 | Disposition: A | Discharge status: Home / Self Care | Payer: Managed Care, Other (non HMO) | Source: Ambulatory Visit | Attending: Adult Health | Admitting: Adult Health

## 2014-10-11 VITALS — BP 108/70 | HR 89 | Temp 96.9°F | Ht 62.0 in | Wt 142.0 lb

## 2014-10-11 DIAGNOSIS — J449 Chronic obstructive pulmonary disease, unspecified: Secondary | ICD-10-CM | POA: Insufficient documentation

## 2014-10-11 DIAGNOSIS — J189 Pneumonia, unspecified organism: Secondary | ICD-10-CM

## 2014-10-11 DIAGNOSIS — J439 Emphysema, unspecified: Secondary | ICD-10-CM

## 2014-10-11 DIAGNOSIS — R0600 Dyspnea, unspecified: Secondary | ICD-10-CM

## 2014-10-11 LAB — PULMONARY FUNCTION TEST
DL/VA % PRED: 90 %
DL/VA: 4.13 ml/min/mmHg/L
DLCO unc % pred: 67 %
DLCO unc: 14.52 ml/min/mmHg
FEF 25-75 POST: 0.82 L/s
FEF 25-75 Pre: 0.83 L/sec
FEF2575-%Change-Post: -1 %
FEF2575-%PRED-PRE: 40 %
FEF2575-%Pred-Post: 39 %
FEV1-%Change-Post: 0 %
FEV1-%Pred-Post: 64 %
FEV1-%Pred-Pre: 64 %
FEV1-POST: 1.45 L
FEV1-PRE: 1.45 L
FEV1FVC-%CHANGE-POST: 3 %
FEV1FVC-%PRED-PRE: 90 %
FEV6-%Change-Post: -2 %
FEV6-%Pred-Post: 70 %
FEV6-%Pred-Pre: 71 %
FEV6-Post: 1.99 L
FEV6-Pre: 2.04 L
FEV6FVC-%Change-Post: 1 %
FEV6FVC-%Pred-Post: 103 %
FEV6FVC-%Pred-Pre: 102 %
FVC-%CHANGE-POST: -3 %
FVC-%Pred-Post: 67 %
FVC-%Pred-Pre: 70 %
FVC-Post: 2 L
FVC-Pre: 2.07 L
PRE FEV1/FVC RATIO: 70 %
PRE FEV6/FVC RATIO: 98 %
Post FEV1/FVC ratio: 72 %
Post FEV6/FVC ratio: 99 %
RV % PRED: 160 %
RV: 3.16 L
TLC % pred: 113 %
TLC: 5.4 L

## 2014-10-11 MED ORDER — UMECLIDINIUM-VILANTEROL 62.5-25 MCG/INH IN AEPB: 1.0000 | INHALATION_SPRAY | Freq: Every day | RESPIRATORY_TRACT | Status: DC

## 2014-10-11 NOTE — Assessment & Plan Note (Signed)
Ongoing recurrent right sided lung densities  intiially right sided cavitary PNA in 07/2014  Improved but without total resolution w/ abx.  11/5 with increased densities , tx w/ Levauqin  Clinically improving  May need swallow evaluation in future  Consider CT chest in 6 weeks if not resolved.  cXR today .  follow up in 2 weeks and As needed

## 2014-10-11 NOTE — Addendum Note (Signed)
Addended by: Devona Konig on: 10/11/2014 12:15 PM   Modules accepted: Orders, Medications

## 2014-10-11 NOTE — Progress Notes (Signed)
Subjective:    Patient ID: Catherine Kelley, female    DOB: [birth year], 63 y.o.   MRN: [id number]  HPI   OV 08/03/2014  Chief Complaint  Patient presents with  . Follow-up    HFU- Pt recently d/c from [facility] for CAP.    63 year old previously healthy female with a smoking history. She was admitted early to mid August 2015 with right lower lobe community acquired pneumonia with septic shock. Review of records suggest that she was not intubated but she is coughing up purulent sputum during the course of the stay. Extensive etiologic workup was negative for specific etiology. Says since that culture negative community-acquired right lower lobe pneumonia with necrotizing features. In sputum had mixed flora Patient herself tells me today that she had symptoms dating back to June 2015 and it failed multiple courses of antibiotics. Prior to this she was apparently healthy. She was discharged to a rehabilitation facility 07/14/2014 with advice to continue IV imipenem and linezolid for 9 days. However today along that she still with a PICC line and still on IV antibiotics. She has no sputum. She was using a walker but she is gaining strength and today is the first day without a walker. It is unclear what her discharge plan from the nursing home is. Otherwise she is feeling better according to her and her husband. Her appetite is slowly returning.  I do note that she has not had HIV or autoimmune workup  She feels she is ready to be dc'd out of SNF. Wants to know if she can stand and work in [employer] all day. Lookng at short term disabiliuty  09/06/2014 Follow up  Returns for a 1 month  follow up for Pneumonia  Pt has had a RLL cavitary PNA complicated by septic shock w/ hospitalization in 07/2014  She was discharged on prolonged IV abx w/ Primaxin, Zyvox x 21 days.  Seen last ov w/ d/c of abx . Labs showed autoimmune and HIV tests were neg. (ANA tr positive -not felt to be clinically significant )  Was discharged  to rehab briefly and now back on home  Out of work since discharge, still feels weak .  Quit smoking after discharge , but admits she cheated. But is going to get nicotine patch to help her quit.  Discussed smoking cessation.  Reports breathing has been doing well since last ov, no new complaints.    10/11/2014 Follow up PNA  Returns for follow up for PNA .  Was seen 1 week ago found to have PNA .  CXR showed new sided densities  Started on levaquin , took last dose today.  Still smoking , advised on smoking cessation  She denies any overt reflux or dysphagia Patient is feeling some better but has continued cough with thick mucus She also is very fatigued Does not feel like she can go back to work . She denies any hemoptysis, chest pain, orthopnea, PND, or increased leg swelling  PFT today showed an FEV1 of 1.45 L, which was 64% of predicted, ratio of 72%, no significant bronchodilator response, FVC at 70%, air trapping noted. Diffusing capacity decreased at 67%      Past Medical History  Diagnosis Date  . Hypertension     essential hypertension  . Tobacco abuse     smoking about 1/2 pk of cigarettes a day  . Anxiety   . Depression     psychiatrist Dr. Toy Care  . Respiratory infection   .  Shortness of breath   . Malaise   . Lightheadedness   . Emphysematous COPD     changes in right base along with patchy areas on last x-ray  . COPD (chronic obstructive pulmonary disease)   . History of echocardiogram 12/24/2006    Est. EF of 38-93% NORMAL LV SYSTOLIC FUNCTION WITH IMPAIRED RELAXATION -- MILD AORTIC SCLEROSIS -- NORMAL PALONARY ARTERY PRESSURE -- NO OLD ECHOS FOR COMPARISON -- [name], MD  . History of cardiovascular stress test 08/15/2004    EF of 70% -- Normal stress cardiolite.  There is no evidence of ischemia and there is normal LV function. -- [name]. MD  . Diastolic dysfunction   . Allergy   . Asthma   . Heart murmur   . Pneumonia     "several  times since May 2015" (07/06/2014)  . Chronic bronchitis     "get it alot; maybe not q yr" (07/06/2014)     Review of Systems  Constitutional:   No  weight loss, night sweats,  Fevers, chills, fatigue, or  lassitude.  HEENT:   No headaches,  Difficulty swallowing,  Tooth/dental problems, or  Sore throat,                No sneezing, itching, ear ache,  +nasal congestion, post nasal drip,   CV:  No chest pain,  Orthopnea, PND, swelling in lower extremities, anasarca, dizziness, palpitations, syncope.   GI  No heartburn, indigestion, abdominal pain, nausea, vomiting, diarrhea, change in bowel habits, loss of appetite, bloody stools.   Resp:  No chest wall deformity  Skin: no rash or lesions.  GU: no dysuria, change in color of urine, no urgency or frequency.  No flank pain, no hematuria   MS:  No joint pain or swelling.  No decreased range of motion.  No back pain.  Psych:  No change in mood or affect. No depression or anxiety.  No memory loss.            Objective:   Physical Exam  GEN: A/Ox3; pleasant , NAD, well nourished   HEENT:  [place]/AT,  EACs-clear, TMs-wnl, NOSE-clear, THROAT-clear, no lesions, no postnasal drip or exudate noted.  Poor dentition   NECK:  Supple w/ fair ROM; no JVD; normal carotid impulses w/o bruits; no thyromegaly or nodules palpated; no lymphadenopathy.  RESP  Few rhonchi .no accessory muscle use, no dullness to percussion  CARD:  RRR, no m/r/g  , no peripheral edema, pulses intact, no cyanosis or clubbing.  GI:   Soft & nt; nml bowel sounds; no organomegaly or masses detected.  Musco: Warm bil, no deformities or joint swelling noted.   Neuro: alert, no focal deficits noted.    Skin: Warm, no lesions or rashes   10/05/14 CXR  There are chronic parenchymal densities in the right lower chest. There are new faint densities in the mid and right lower lung. Left lung remains clear. Heart size is stable. The trachea is midline. There are chronic  parenchymal densities in the right lower chest. There are new faint densities in the mid and right lower lung. Left lung remains clear. Heart size is stable. The trachea is midline.

## 2014-10-11 NOTE — Patient Instructions (Signed)
Begin ANORO 1 puff daily  Mucinex DM Twice daily  .As needed  Cough/congestion  Fluids and rest  Chest xray today .  No smoking .  Follow up 2 weeks with Dr. Chase Caller and As needed   Please contact office for sooner follow up if symptoms do not improve or worsen or seek emergency care

## 2014-10-11 NOTE — Assessment & Plan Note (Signed)
Minimal airflow obstruction with probable emphysema w/ air trapping  PFT today showed an FEV1 of 1.45 L, which was 64% of predicted, ratio of 72%, no significant bronchodilator response, FVC at 70%, air trapping noted. Diffusing capacity decreased at 67%  Plan  Begin ANORO  Check alpha one on return  Consider pulmonary rehab in future once over acute illness.  Smoking cessation

## 2014-10-11 NOTE — Progress Notes (Signed)
PFT done today. 

## 2014-10-12 ENCOUNTER — Telehealth: Payer: Self-pay | Admitting: Adult Health

## 2014-10-12 NOTE — Telephone Encounter (Signed)
Called and spoke to pt. Pt requesting CXR results from 11/11 when seen by TP. Pt aware the results have not been reviewed yet but will call when they are. Pt verbalized understanding and denied any further questions or concerns at this time.   TP please advise on CXR results.

## 2014-10-12 NOTE — Telephone Encounter (Signed)
See xray results. 

## 2014-10-13 NOTE — Progress Notes (Signed)
Quick Note:  Spoke with the pt and notified of results  She verbalized understanding and denied any questions ______

## 2014-10-13 NOTE — Telephone Encounter (Signed)
Spoke with the pt and notified of results of cxr  She verbalized understanding  Will call if needed

## 2014-10-13 NOTE — Telephone Encounter (Signed)
lmtcb for pt.  Notes Recorded by Melvenia Needles, NP on 10/12/2014 at 6:14 PM CXR is improving  Cont w/ ov recs  Please contact office for sooner follow up if symptoms do not improve or worsen or seek emergency care  Repeat cxr on return

## 2014-10-13 NOTE — Telephone Encounter (Signed)
Patient returning call.  [phone]

## 2014-10-25 ENCOUNTER — Ambulatory Visit (INDEPENDENT_AMBULATORY_CARE_PROVIDER_SITE_OTHER)
Admission: RE | Admit: 2014-10-25 | Discharge: 2014-10-25 | Disposition: A | Discharge status: Home / Self Care | Payer: Managed Care, Other (non HMO) | Source: Ambulatory Visit | Attending: Adult Health | Admitting: Adult Health

## 2014-10-25 ENCOUNTER — Encounter: Payer: Self-pay | Admitting: Adult Health

## 2014-10-25 ENCOUNTER — Ambulatory Visit (INDEPENDENT_AMBULATORY_CARE_PROVIDER_SITE_OTHER): Payer: Managed Care, Other (non HMO) | Admitting: Adult Health

## 2014-10-25 ENCOUNTER — Other Ambulatory Visit: Payer: Managed Care, Other (non HMO)

## 2014-10-25 VITALS — BP 122/82 | HR 90 | Temp 96.9°F | Ht 63.0 in | Wt 142.0 lb

## 2014-10-25 DIAGNOSIS — J189 Pneumonia, unspecified organism: Secondary | ICD-10-CM

## 2014-10-25 MED ORDER — HYDROCODONE-HOMATROPINE 5-1.5 MG/5ML PO SYRP: 5.0000 mL | ORAL_SOLUTION | Freq: Four times a day (QID) | ORAL | Status: DC | PRN

## 2014-10-25 NOTE — Addendum Note (Signed)
Addended by: Parke Poisson E on: 10/25/2014 10:10 AM   Modules accepted: Orders

## 2014-10-25 NOTE — Progress Notes (Signed)
Subjective:    Patient ID: Catherine Kelley, female    DOB: 1951/05/09, 63 y.o.   MRN: [id number]  HPI    Review of Systems     Objective:   Physical Exam        Assessment & Plan:     Subjective:    Patient ID: Catherine Kelley, female    DOB: 10-Nov-1951, 63 y.o.   MRN: [id number]  HPI   OV 08/03/2014  Chief Complaint  Patient presents with  . Follow-up    HFU- Pt recently d/c from [facility] for CAP.    63 year old previously healthy female with a smoking history. She was admitted early to mid August 2015 with right lower lobe community acquired pneumonia with septic shock. Review of records suggest that she was not intubated but she is coughing up purulent sputum during the course of the stay. Extensive etiologic workup was negative for specific etiology. Says since that culture negative community-acquired right lower lobe pneumonia with necrotizing features. In sputum had mixed flora Patient herself tells me today that she had symptoms dating back to June 2015 and it failed multiple courses of antibiotics. Prior to this she was apparently healthy. She was discharged to a rehabilitation facility 07/14/2014 with advice to continue IV imipenem and linezolid for 9 days. However today along that she still with a PICC line and still on IV antibiotics. She has no sputum. She was using a walker but she is gaining strength and today is the first day without a walker. It is unclear what her discharge plan from the nursing home is. Otherwise she is feeling better according to her and her husband. Her appetite is slowly returning.  I do note that she has not had HIV or autoimmune workup  She feels she is ready to be dc'd out of SNF. Wants to know if she can stand and work in [employer] all day. Lookng at short term disabiliuty  09/06/2014 Follow up  Returns for a 1 month  follow up for Pneumonia  Pt has had a RLL cavitary PNA complicated by septic shock w/ hospitalization in 07/2014  She was discharged on  prolonged IV abx w/ Primaxin, Zyvox x 21 days.  Seen last ov w/ d/c of abx . Labs showed autoimmune and HIV tests were neg. (ANA tr positive -not felt to be clinically significant )  Was discharged to rehab briefly and now back on home  Out of work since discharge, still feels weak .  Quit smoking after discharge , but admits she cheated. But is going to get nicotine patch to help her quit.  Discussed smoking cessation.  Reports breathing has been doing well since last ov, no new complaints.    10/05/14 Acute OV  Complains of sore throat, prod cough with "thick and gewy" off-white mucus x1 week.  On pred taper per PCP for lumbar back pain (10/29) also started on flexeril and vicodin .  She has been doing better until last week .  CXR today shows new right sided densities .  No fever , chest pain, orthopnea, edema or n/v.  Appetite is good .  No trouble swallowing.  Still smoking , advised on smoking cessation  >Levquin 750mg  x 7 d    10/11/14  Follow up PNA  Returns for follow up for PNA .  Was seen 1 week ago found to have PNA .  CXR showed new sided densities  Started on levaquin , took last dose today.  Still smoking , advised on smoking cessation  She denies any overt reflux or dysphagia Patient is feeling some better but has continued cough with thick mucus She also is very fatigued Does not feel like she can go back to work . She denies any hemoptysis, chest pain, orthopnea, PND, or increased leg swelling  PFT today showed an FEV1 of 1.45 L, which was 64% of predicted, ratio of 72%, no significant bronchodilator response, FVC at 70%, air trapping noted. Diffusing capacity decreased at 67% >>CXR >improving RLL PNA   10/25/2014 Follow up PNA /COPD smoker  Patient returns for a two-week follow-up.  patient was admitted earlier this year in August for a cavitary right sided pneumonia with sepsis.  She was treated with aggressive IV antibiotics, and discharged on prolonged  antibiotic course  Her chest x-ray continued to improve  With serial follow-up.  3 weeks ago. Patient had increase cough and congestion. Was found to have new right-sided infiltrates consistent with pneumonia.  She was started on  Levaquin 750 mg for 7 days.  Chest x-ray 2 weeks ago showed improvement  in right-sided  consolidation  she returns today with improved symptoms. Reports breathing is improved but still having a lot of congestion esp at night tan/yellow/off-white mucus, some discomfort in the right rib area.  Feels the Anoro is working well for her  she continues to smoke but has cut back. We discussed smoking cessation  Appetite  And energy level is improving She denies any fever, hemoptysis, chest pain, orthopnea, or overt reflux nausea, vomiting, or diarrhea  chest x-ray today shows persistent right-sided  Densities with slight progression.     Past Medical History  Diagnosis Date  . Hypertension     essential hypertension  . Tobacco abuse     smoking about 1/2 pk of cigarettes a day  . Anxiety   . Depression     psychiatrist Dr. Toy Care  . Respiratory infection   . Shortness of breath   . Malaise   . Lightheadedness   . Emphysematous COPD     changes in right base along with patchy areas on last x-ray  . COPD (chronic obstructive pulmonary disease)   . History of echocardiogram 12/24/2006    Est. EF of 24-26% NORMAL LV SYSTOLIC FUNCTION WITH IMPAIRED RELAXATION -- MILD AORTIC SCLEROSIS -- NORMAL PALONARY ARTERY PRESSURE -- NO OLD ECHOS FOR COMPARISON -- [name], MD  . History of cardiovascular stress test 08/15/2004    EF of 70% -- Normal stress cardiolite.  There is no evidence of ischemia and there is normal LV function. -- [name]. MD  . Diastolic dysfunction   . Allergy   . Asthma   . Heart murmur   . Pneumonia     "several times since May 2015" (07/06/2014)  . Chronic bronchitis     "get it alot; maybe not q yr" (07/06/2014)     Review of  Systems  Constitutional:   No  weight loss, night sweats,  Fevers, chills, fatigue, or  lassitude.  HEENT:   No headaches,  Difficulty swallowing,  Tooth/dental problems, or  Sore throat,                No sneezing, itching, ear ache,  +nasal congestion, post nasal drip,   CV:  No chest pain,  Orthopnea, PND, swelling in lower extremities, anasarca, dizziness, palpitations, syncope.   GI  No heartburn, indigestion, abdominal pain, nausea, vomiting, diarrhea, change in bowel habits, loss  of appetite, bloody stools.   Resp:  No chest wall deformity  Skin: no rash or lesions.  GU: no dysuria, change in color of urine, no urgency or frequency.  No flank pain, no hematuria   MS:  No joint pain or swelling.  No decreased range of motion.  No back pain.  Psych:  No change in mood or affect. No depression or anxiety.  No memory loss.            Objective:   Physical Exam  GEN: A/Ox3; pleasant , NAD, well nourished   HEENT:  [place]/AT,  EACs-clear, TMs-wnl, NOSE-clear, THROAT-clear, no lesions, no postnasal drip or exudate noted.  Poor dentition   NECK:  Supple w/ fair ROM; no JVD; normal carotid impulses w/o bruits; no thyromegaly or nodules palpated; no lymphadenopathy.  RESP  Tr rhonchi .no accessory muscle use, no dullness to percussion  CARD:  RRR, no m/r/g  , no peripheral edema, pulses intact, no cyanosis or clubbing.  GI:   Soft & nt; nml bowel sounds; no organomegaly or masses detected.  Musco: Warm bil, no deformities or joint swelling noted.   Neuro: alert, no focal deficits noted.    Skin: Warm, no lesions or rashes   10/25/2014 CXR  Progressive right lower lobe infiltrate is present. A component of cavitation cannot be excluded. Left lung is clear

## 2014-10-25 NOTE — Patient Instructions (Addendum)
Sputum Culture today  We are setting you up for a CT chest next week .  Mucinex DM Twice daily  As needed  Cough/congesiton  Continue on ANORO 1 puff daily  Continue to work on not smoking .  Follow up Dr. Chase Caller in 6 weeks and As needed

## 2014-10-25 NOTE — Addendum Note (Signed)
Addended by: Parke Poisson E on: 10/25/2014 12:17 PM   Modules accepted: Orders

## 2014-10-25 NOTE — Assessment & Plan Note (Signed)
Recurrent right sided pneumonia, questionable aspiration  patient is clinically, improving.  we'll check sputum culture and set patient up for CT chest  Plan  Sputum Culture today  We are setting you up for a CT chest next week .  Mucinex DM Twice daily  As needed  Cough/congesiton  Continue on ANORO 1 puff daily  Continue to work on not smoking .  Follow up Dr. Chase Caller in 6 weeks and As needed

## 2014-10-28 LAB — RESPIRATORY CULTURE OR RESPIRATORY AND SPUTUM CULTURE: ORGANISM ID, BACTERIA: NORMAL

## 2014-11-01 ENCOUNTER — Ambulatory Visit (INDEPENDENT_AMBULATORY_CARE_PROVIDER_SITE_OTHER)
Admission: RE | Admit: 2014-11-01 | Discharge: 2014-11-01 | Disposition: A | Discharge status: Home / Self Care | Payer: Managed Care, Other (non HMO) | Source: Ambulatory Visit | Attending: Adult Health | Admitting: Adult Health

## 2014-11-01 DIAGNOSIS — J189 Pneumonia, unspecified organism: Secondary | ICD-10-CM

## 2014-11-01 NOTE — Progress Notes (Signed)
Quick Note:  lmtcb X1 to relay results. ______ 

## 2014-11-06 NOTE — Progress Notes (Signed)
Quick Note:  LMOM TCB x2. ______ 

## 2014-11-06 NOTE — Progress Notes (Signed)
Quick Note:  LMOM TCB x1. ______ 

## 2014-11-14 ENCOUNTER — Ambulatory Visit: Payer: Managed Care, Other (non HMO) | Admitting: Cardiology

## 2014-11-15 ENCOUNTER — Other Ambulatory Visit: Payer: Self-pay | Admitting: Adult Health

## 2014-11-15 DIAGNOSIS — J189 Pneumonia, unspecified organism: Secondary | ICD-10-CM

## 2014-11-15 NOTE — Progress Notes (Signed)
Quick Note:  LMOM TCB x2. ______ 

## 2014-11-15 NOTE — Progress Notes (Signed)
Quick Note:  Patient returned call. Advised of CT results / recs as stated by TP. Pt verbalized understanding and denied any questions. Pt to keep her 1.13.16 w/ MR Orders only encounter created for CT in March 2016 ______

## 2014-11-15 NOTE — Progress Notes (Signed)
Result Notes     Notes Recorded by Rinaldo Ratel, CMA on 11/15/2014 at 12:52 PM Patient returned call. Advised of CT results / recs as stated by TP. Pt verbalized understanding and denied any questions. Pt to keep her 1.13.16 w/ MR Orders only encounter created for CT in March 2016 ------  Notes Recorded by Rinaldo Ratel, CMA on 11/15/2014 at 12:45 PM LMOM TCB x2. ------  Notes Recorded by Rinaldo Ratel, CMA on 11/06/2014 at 11:43 AM LMOM TCB x1. ------  Notes Recorded by Melvenia Needles, NP on 11/03/2014 at 11:39 AM CT is improving , decreasing PNA process ? Scarring  Will repeat CT chest in 3 months to follow this area follow up next month Dr. Chase Caller as planned  Please contact office for sooner follow up if symptoms do not improve or worsen or seek emergency care  >set up CT w/o contrast , put on reminder list

## 2014-11-16 ENCOUNTER — Ambulatory Visit (INDEPENDENT_AMBULATORY_CARE_PROVIDER_SITE_OTHER)
Admission: RE | Admit: 2014-11-16 | Discharge: 2014-11-16 | Disposition: A | Discharge status: Home / Self Care | Payer: Managed Care, Other (non HMO) | Source: Ambulatory Visit | Attending: Adult Health | Admitting: Adult Health

## 2014-11-16 ENCOUNTER — Encounter: Payer: Self-pay | Admitting: Adult Health

## 2014-11-16 ENCOUNTER — Ambulatory Visit (INDEPENDENT_AMBULATORY_CARE_PROVIDER_SITE_OTHER): Payer: Managed Care, Other (non HMO) | Admitting: Adult Health

## 2014-11-16 VITALS — BP 112/60 | HR 85 | Temp 98.1°F | Ht 63.0 in | Wt 137.4 lb

## 2014-11-16 DIAGNOSIS — J189 Pneumonia, unspecified organism: Secondary | ICD-10-CM

## 2014-11-16 DIAGNOSIS — J439 Emphysema, unspecified: Secondary | ICD-10-CM

## 2014-11-16 MED ORDER — DOXYCYCLINE HYCLATE 100 MG PO TABS: 100.0000 mg | ORAL_TABLET | Freq: Two times a day (BID) | ORAL | Status: DC

## 2014-11-16 NOTE — Progress Notes (Signed)
Subjective:    Patient ID: Catherine Kelley, female    DOB: 1951-03-31, 63 y.o.   MRN: [id number]  HPI   OV 08/03/2014  Chief Complaint  Patient presents with  . Follow-up    HFU- Pt recently d/c from [facility] for CAP.    63 year old previously healthy female with a smoking history. She was admitted early to mid August 2015 with right lower lobe community acquired pneumonia with septic shock. Review of records suggest that she was not intubated but she is coughing up purulent sputum during the course of the stay. Extensive etiologic workup was negative for specific etiology. Says since that culture negative community-acquired right lower lobe pneumonia with necrotizing features. In sputum had mixed flora Patient herself tells me today that she had symptoms dating back to June 2015 and it failed multiple courses of antibiotics. Prior to this she was apparently healthy. She was discharged to a rehabilitation facility 07/14/2014 with advice to continue IV imipenem and linezolid for 9 days. However today along that she still with a PICC line and still on IV antibiotics. She has no sputum. She was using a walker but she is gaining strength and today is the first day without a walker. It is unclear what her discharge plan from the nursing home is. Otherwise she is feeling better according to her and her husband. Her appetite is slowly returning.  I do note that she has not had HIV or autoimmune workup  She feels she is ready to be dc'd out of SNF. Wants to know if she can stand and work in [employer] all day. Lookng at short term disabiliuty  09/06/2014 Follow up  Returns for a 1 month  follow up for Pneumonia  Pt has had a RLL cavitary PNA complicated by septic shock w/ hospitalization in 07/2014  She was discharged on prolonged IV abx w/ Primaxin, Zyvox x 21 days.  Seen last ov w/ d/c of abx . Labs showed autoimmune and HIV tests were neg. (ANA tr positive -not felt to be clinically significant )  Was  discharged to rehab briefly and now back on home  Out of work since discharge, still feels weak .  Quit smoking after discharge , but admits she cheated. But is going to get nicotine patch to help her quit.  Discussed smoking cessation.  Reports breathing has been doing well since last ov, no new complaints.    10/05/14 Acute OV  Complains of sore throat, prod cough with "thick and gewy" off-white mucus x1 week.  On pred taper per PCP for lumbar back pain (10/29) also started on flexeril and vicodin .  She has been doing better until last week .  CXR today shows new right sided densities .  No fever , chest pain, orthopnea, edema or n/v.  Appetite is good .  No trouble swallowing.  Still smoking , advised on smoking cessation  >Levquin 750mg  x 7 d    10/11/14  Follow up PNA  Returns for follow up for PNA .  Was seen 1 week ago found to have PNA .  CXR showed new sided densities  Started on levaquin , took last dose today.  Still smoking , advised on smoking cessation  She denies any overt reflux or dysphagia Patient is feeling some better but has continued cough with thick mucus She also is very fatigued Does not feel like she can go back to work . She denies any hemoptysis,  chest pain, orthopnea, PND, or increased leg swelling  PFT today showed an FEV1 of 1.45 L, which was 64% of predicted, ratio of 72%, no significant bronchodilator response, FVC at 70%, air trapping noted. Diffusing capacity decreased at 67% >>CXR >improving RLL PNA   10/25/2014 Follow up PNA /COPD smoker  Patient returns for a two-week follow-up.  patient was admitted earlier this year in August for a cavitary right sided pneumonia with sepsis.  She was treated with aggressive IV antibiotics, and discharged on prolonged antibiotic course  Her chest x-ray continued to improve  With serial follow-up.  3 weeks ago. Patient had increase cough and congestion. Was found to have new right-sided infiltrates consistent  with pneumonia.  She was started on  Levaquin 750 mg for 7 days.  Chest x-ray 2 weeks ago showed improvement  in right-sided  consolidation  she returns today with improved symptoms. Reports breathing is improved but still having a lot of congestion esp at night tan/yellow/off-white mucus, some discomfort in the right rib area.  Feels the Anoro is working well for her  she continues to smoke but has cut back. We discussed smoking cessation  Appetite  And energy level is improving She denies any fever, hemoptysis, chest pain, orthopnea, or overt reflux nausea, vomiting, or diarrhea  chest x-ray today shows persistent right-sided  Densities with slight progression. >>CT chest    11/16/2014 Acute OV  Present for an acute office visit .  Complains of fever and body aches and chills, headaches, prod cough (yellow, green) for 3 days.   Denies sob, wheezing, or chest discomfort, hemoptyiss , edema or rash.  Last ov CXR showed right sided infiltrates  Subsequent CT chest showed improved cavitary consolidatioin in the RLL.  CXR today shows no sign changes.  Has been doing well up until 3 days ago. Working at [employer] .    Review of Systems  Constitutional:   No  weight loss, night sweats,  Fevers, chills, fatigue, or  lassitude.  HEENT:   No headaches,  Difficulty swallowing,  Tooth/dental problems, or  Sore throat,                No sneezing, itching, ear ache,  +nasal congestion, post nasal drip,   CV:  No chest pain,  Orthopnea, PND, swelling in lower extremities, anasarca, dizziness, palpitations, syncope.   GI  No heartburn, indigestion, abdominal pain, nausea, vomiting, diarrhea, change in bowel habits, loss of appetite, bloody stools.   Resp:  No chest wall deformity  Skin: no rash or lesions.  GU: no dysuria, change in color of urine, no urgency or frequency.  No flank pain, no hematuria   MS:  No joint pain or swelling.  No decreased range of motion.  No back pain.  Psych:  No  change in mood or affect. No depression or anxiety.  No memory loss.            Objective:   Physical Exam  GEN: A/Ox3; pleasant , NAD, well nourished   HEENT:  [place]/AT,  EACs-clear, TMs-wnl, NOSE-clear, THROAT-clear, no lesions, no postnasal drip or exudate noted.  Poor dentition   NECK:  Supple w/ fair ROM; no JVD; normal carotid impulses w/o bruits; no thyromegaly or nodules palpated; no lymphadenopathy.  RESP  Tr rhonchi , no wheezing or stridor no accessory muscle use, no dullness to percussion  CARD:  RRR, no m/r/g  , no peripheral edema, pulses intact, no cyanosis or clubbing.  GI:  Soft & nt; nml bowel sounds; no organomegaly or masses detected.  Musco: Warm bil, no deformities or joint swelling noted.   Neuro: alert, no focal deficits noted.    Skin: Warm, no lesions or rashes   10/25/2014 CXR  Progressive right lower lobe infiltrate is present. A component of cavitation cannot be excluded. Left lung is clear  CT chest 11/01/14 >Improved cavitary consolidation in the right lower lobe. There is a persistent cavitary indeterminate pulmonary opacity as described above. This probably represents evolution of the cavitary inflammatory process with scarring. Continued followup until complete resolution with chest CT without contrast in 3 months is Recommended.  11/16/2014 CXR Persistent infiltrate with cavitation in the right lower lobe which

## 2014-11-16 NOTE — Progress Notes (Signed)
Subjective:    Patient ID: Catherine Kelley, female    DOB: 1951-01-28, 63 y.o.   MRN: [id number]  HPI    Review of Systems     Objective:   Physical Exam        Assessment & Plan:     Subjective:    Patient ID: Catherine Kelley, female    DOB: October 23, 1951, 63 y.o.   MRN: [id number]  HPI   OV 08/03/2014  Chief Complaint  Patient presents with  . Follow-up    HFU- Pt recently d/c from [facility] for CAP.    63 year old previously healthy female with a smoking history. She was admitted early to mid August 2015 with right lower lobe community acquired pneumonia with septic shock. Review of records suggest that she was not intubated but she is coughing up purulent sputum during the course of the stay. Extensive etiologic workup was negative for specific etiology. Says since that culture negative community-acquired right lower lobe pneumonia with necrotizing features. In sputum had mixed flora Patient herself tells me today that she had symptoms dating back to June 2015 and it failed multiple courses of antibiotics. Prior to this she was apparently healthy. She was discharged to a rehabilitation facility 07/14/2014 with advice to continue IV imipenem and linezolid for 9 days. However today along that she still with a PICC line and still on IV antibiotics. She has no sputum. She was using a walker but she is gaining strength and today is the first day without a walker. It is unclear what her discharge plan from the nursing home is. Otherwise she is feeling better according to her and her husband. Her appetite is slowly returning.  I do note that she has not had HIV or autoimmune workup  She feels she is ready to be dc'd out of SNF. Wants to know if she can stand and work in [employer] all day. Lookng at short term disabiliuty  09/06/2014 Follow up  Returns for a 1 month  follow up for Pneumonia  Pt has had a RLL cavitary PNA complicated by septic shock w/ hospitalization in 07/2014  She was discharged on  prolonged IV abx w/ Primaxin, Zyvox x 21 days.  Seen last ov w/ d/c of abx . Labs showed autoimmune and HIV tests were neg. (ANA tr positive -not felt to be clinically significant )  Was discharged to rehab briefly and now back on home  Out of work since discharge, still feels weak .  Quit smoking after discharge , but admits she cheated. But is going to get nicotine patch to help her quit.  Discussed smoking cessation.  Reports breathing has been doing well since last ov, no new complaints.    10/05/14 Acute OV  Complains of sore throat, prod cough with "thick and gewy" off-white mucus x1 week.  On pred taper per PCP for lumbar back pain (10/29) also started on flexeril and vicodin .  She has been doing better until last week .  CXR today shows new right sided densities .  No fever , chest pain, orthopnea, edema or n/v.  Appetite is good .  No trouble swallowing.  Still smoking , advised on smoking cessation  >Levquin 750mg  x 7 d    10/11/14  Follow up PNA  Returns for follow up for PNA .  Was seen 1 week ago found to have PNA .  CXR showed new sided densities  Started on levaquin , took last dose today.  Still smoking , advised on smoking cessation  She denies any overt reflux or dysphagia Patient is feeling some better but has continued cough with thick mucus She also is very fatigued Does not feel like she can go back to work . She denies any hemoptysis, chest pain, orthopnea, PND, or increased leg swelling  PFT today showed an FEV1 of 1.45 L, which was 64% of predicted, ratio of 72%, no significant bronchodilator response, FVC at 70%, air trapping noted. Diffusing capacity decreased at 67% >>CXR >improving RLL PNA   11/16/2014 Follow up PNA /COPD smoker  Patient returns for a two-week follow-up.  patient was admitted earlier this year in August for a cavitary right sided pneumonia with sepsis.  She was treated with aggressive IV antibiotics, and discharged on prolonged  antibiotic course  Her chest x-ray continued to improve  With serial follow-up.  3 weeks ago. Patient had increase cough and congestion. Was found to have new right-sided infiltrates consistent with pneumonia.  She was started on  Levaquin 750 mg for 7 days.  Chest x-ray 2 weeks ago showed improvement  in right-sided  consolidation  she returns today with improved symptoms. Reports breathing is improved but still having a lot of congestion esp at night tan/yellow/off-white mucus, some discomfort in the right rib area.  Feels the Anoro is working well for her  she continues to smoke but has cut back. We discussed smoking cessation  Appetite  And energy level is improving She denies any fever, hemoptysis, chest pain, orthopnea, or overt reflux nausea, vomiting, or diarrhea  chest x-ray today shows persistent right-sided  Densities with slight progression.     Past Medical History  Diagnosis Date  . Hypertension     essential hypertension  . Tobacco abuse     smoking about 1/2 pk of cigarettes a day  . Anxiety   . Depression     psychiatrist Dr. Toy Care  . Respiratory infection   . Shortness of breath   . Malaise   . Lightheadedness   . Emphysematous COPD     changes in right base along with patchy areas on last x-ray  . COPD (chronic obstructive pulmonary disease)   . History of echocardiogram 12/24/2006    Est. EF of 62-70% NORMAL LV SYSTOLIC FUNCTION WITH IMPAIRED RELAXATION -- MILD AORTIC SCLEROSIS -- NORMAL PALONARY ARTERY PRESSURE -- NO OLD ECHOS FOR COMPARISON -- [name], MD  . History of cardiovascular stress test 08/15/2004    EF of 70% -- Normal stress cardiolite.  There is no evidence of ischemia and there is normal LV function. -- [name]. MD  . Diastolic dysfunction   . Allergy   . Asthma   . Heart murmur   . Pneumonia     "several times since May 2015" (07/06/2014)  . Chronic bronchitis     "get it alot; maybe not q yr" (07/06/2014)     Review of  Systems  Constitutional:   No  weight loss, night sweats,  Fevers, chills, fatigue, or  lassitude.  HEENT:   No headaches,  Difficulty swallowing,  Tooth/dental problems, or  Sore throat,                No sneezing, itching, ear ache,  +nasal congestion, post nasal drip,   CV:  No chest pain,  Orthopnea, PND, swelling in lower extremities, anasarca, dizziness, palpitations, syncope.   GI  No heartburn, indigestion, abdominal pain, nausea, vomiting, diarrhea, change in bowel habits, loss  of appetite, bloody stools.   Resp:  No chest wall deformity  Skin: no rash or lesions.  GU: no dysuria, change in color of urine, no urgency or frequency.  No flank pain, no hematuria   MS:  No joint pain or swelling.  No decreased range of motion.  No back pain.  Psych:  No change in mood or affect. No depression or anxiety.  No memory loss.            Objective:   Physical Exam  GEN: A/Ox3; pleasant , NAD, well nourished   HEENT:  Pharr/AT,  EACs-clear, TMs-wnl, NOSE-clear, THROAT-clear, no lesions, no postnasal drip or exudate noted.  Poor dentition   NECK:  Supple w/ fair ROM; no JVD; normal carotid impulses w/o bruits; no thyromegaly or nodules palpated; no lymphadenopathy.  RESP  Tr rhonchi .no accessory muscle use, no dullness to percussion  CARD:  RRR, no m/r/g  , no peripheral edema, pulses intact, no cyanosis or clubbing.  GI:   Soft & nt; nml bowel sounds; no organomegaly or masses detected.  Musco: Warm bil, no deformities or joint swelling noted.   Neuro: alert, no focal deficits noted.    Skin: Warm, no lesions or rashes   11/16/2014 CXR  Progressive right lower lobe infiltrate is present. A component of cavitation cannot be excluded. Left lung is clear

## 2014-11-16 NOTE — Assessment & Plan Note (Signed)
Flare with recurrent bronchitis and PNA w/ resolving Right sided cavitary lesion /consolidation  Most recent CT shows resolving area, clinically was improving  No sign change in cxr today  Does not appear toxic Will tx w/ abx  today for flare  Close follow up   Plan  Doxycyline 100mg  Twice daily  For 7 days -take with food  Eat yogurt daily .  Mucinex DM Twice daily  As needed  Cough/congesiton  Continue on ANORO 1 puff daily  Continue to work on not smoking .  Follow up Dr. Chase Caller in 4 weeks as planned and As needed

## 2014-11-16 NOTE — Patient Instructions (Addendum)
Doxycyline 100mg  Twice daily  For 7 days -take with food  Eat yogurt daily .  Mucinex DM Twice daily  As needed  Cough/congesiton  Continue on ANORO 1 puff daily  Continue to work on not smoking .  Follow up Dr. Chase Caller in 4 weeks as planned and As needed

## 2014-11-27 ENCOUNTER — Other Ambulatory Visit: Payer: Self-pay | Admitting: Cardiology

## 2014-11-27 ENCOUNTER — Ambulatory Visit (INDEPENDENT_AMBULATORY_CARE_PROVIDER_SITE_OTHER): Payer: Managed Care, Other (non HMO)

## 2014-11-27 ENCOUNTER — Ambulatory Visit (INDEPENDENT_AMBULATORY_CARE_PROVIDER_SITE_OTHER): Payer: Managed Care, Other (non HMO) | Admitting: Family Medicine

## 2014-11-27 VITALS — BP 100/60 | HR 96 | Temp 98.4°F | Resp 16 | Ht 63.0 in | Wt 140.0 lb

## 2014-11-27 DIAGNOSIS — M5417 Radiculopathy, lumbosacral region: Secondary | ICD-10-CM

## 2014-11-27 DIAGNOSIS — J189 Pneumonia, unspecified organism: Secondary | ICD-10-CM

## 2014-11-27 DIAGNOSIS — M545 Low back pain, unspecified: Secondary | ICD-10-CM

## 2014-11-27 DIAGNOSIS — B370 Candidal stomatitis: Secondary | ICD-10-CM

## 2014-11-27 DIAGNOSIS — J029 Acute pharyngitis, unspecified: Secondary | ICD-10-CM

## 2014-11-27 DIAGNOSIS — M5416 Radiculopathy, lumbar region: Secondary | ICD-10-CM

## 2014-11-27 DIAGNOSIS — M7989 Other specified soft tissue disorders: Secondary | ICD-10-CM

## 2014-11-27 LAB — POCT CBC
Granulocyte percent: 62.5 %G (ref 37–80)
HCT, POC: 39.8 % (ref 37.7–47.9)
HEMOGLOBIN: 13 g/dL (ref 12.2–16.2)
Lymph, poc: 3.7 — AB (ref 0.6–3.4)
MCH, POC: 29.8 pg (ref 27–31.2)
MCHC: 32.7 g/dL (ref 31.8–35.4)
MCV: 91.1 fL (ref 80–97)
MID (cbc): 0.4 (ref 0–0.9)
MPV: 7.6 fL (ref 0–99.8)
POC GRANULOCYTE: 6.9 (ref 2–6.9)
POC LYMPH PERCENT: 33.6 %L (ref 10–50)
POC MID %: 3.9 % (ref 0–12)
Platelet Count, POC: 314 10*3/uL (ref 142–424)
RBC: 4.37 M/uL (ref 4.04–5.48)
RDW, POC: 14.9 %
WBC: 11 10*3/uL — AB (ref 4.6–10.2)

## 2014-11-27 LAB — POCT RAPID STREP A (OFFICE): Rapid Strep A Screen: NEGATIVE

## 2014-11-27 LAB — POCT URINALYSIS DIPSTICK
Bilirubin, UA: NEGATIVE
Glucose, UA: NEGATIVE
KETONES UA: NEGATIVE
NITRITE UA: NEGATIVE
PH UA: 7
PROTEIN UA: NEGATIVE
RBC UA: NEGATIVE
Spec Grav, UA: 1.01
Urobilinogen, UA: 0.2

## 2014-11-27 MED ORDER — HYDROCODONE-ACETAMINOPHEN 5-325 MG PO TABS: 1.0000 | ORAL_TABLET | Freq: Three times a day (TID) | ORAL | Status: DC | PRN

## 2014-11-27 MED ORDER — ALBUTEROL SULFATE HFA 108 (90 BASE) MCG/ACT IN AERS: 2.0000 | INHALATION_SPRAY | Freq: Four times a day (QID) | RESPIRATORY_TRACT | Status: DC | PRN

## 2014-11-27 MED ORDER — LEVOFLOXACIN 750 MG PO TABS: 750.0000 mg | ORAL_TABLET | Freq: Every day | ORAL | Status: DC

## 2014-11-27 MED ORDER — METHOCARBAMOL 500 MG PO TABS: 500.0000 mg | ORAL_TABLET | Freq: Four times a day (QID) | ORAL | Status: DC

## 2014-11-27 MED ORDER — NYSTATIN 100000 UNIT/ML MT SUSP: 5.0000 mL | Freq: Four times a day (QID) | OROMUCOSAL | Status: DC

## 2014-11-27 NOTE — Progress Notes (Addendum)
Subjective:    Patient ID: Catherine Kelley, female    DOB: 1951/02/14, 63 y.o.   MRN: [id number] This chart was scribed for Wardell Honour, MD by Girtha Hake, ED Scribe. The patient was seen in Room 4. The patient's care was started at 8:38 PM.   11/27/2014  Back Pain and Leg Swelling   HPI HPI Comments: Catherine Kelley is a 63 y.o. female who presents today complaining of back pain and leg swelling beginning one week ago. Patient also complains sore throat, chills, SOB, and a cough that produces yellow and green sputum. She was diagnosed with pneumonia earlier this month on 11/16/2014 by Tammy Parrett of Pulmonology; treated with Doxycycline without improvement in cough. She denies otalgia, vomiting, or diarrhea.  ST developed two days ago; headache developed two days ago.  S/p CT chest this month that showed improvement in infiltrate.    Patient has a history of back pain beginning approximately several years ago.  Evaluated two months ago at [facility]; underwent lumbar spine films at that time that revealed moderate degenerative changes with Grade I anterolisthesis L4-5.  Prescribed Flexeril with improvement in symptoms until one week ago.She reports that the pain was diminished before it began to worsen 2-3 days ago.  No radicular symptoms;no n/t/w in legs; no saddle paresthesias; no b/b dysfunction.    Patient had persistent pneumonia in June. Patient refused CT of the chest at that time due to financial restrictions. She presented to the ED due to confusion in August and was admitted with septic shock and pneumonia and had a prolonged hospitalization without clear etiology to her pneumonia. She was discharged after the hospitalization to rehab her conditioning for three weeks. Patient has been out of work since hospitalization.   She has suffered with recurrent pneumonia in 10/2014 after hospitalization in 07/2014.    Leg swelling: has a chronic long term history of recurrent leg swelling. Has  undergone evaluation by Dr. Mare Ferrari in past for leg swelling; she was prescribed lasix 20mg  two tablets daily two years ago for leg swelling and had been maintained on Lasix daily since that time.  She was prescribed compression stockings but could not afford $18 for each set.  She wears supportive hose daily.  Leg swelling developed one week ago.  Redness to L>R lower leg developed in past two days and patient is concerned about cellulitis.  S/p LE dopplers in 08/11/2013 for calf pain and in 03/09/2012 for leg swelling; each study negative for DVT.  S/p ID consultation in 09/29/2012 for recurrent LE cellulitis associated with chronic venous swelling; Dr. Megan Salon did not recommend ongoing abx due to lack of warmth, tenderness, or fever; felt like redness of legs secondary to chronic venous dermatitis.  Denies chest pain, palpitations, SOB, orthopnea at this time.  No recent change in activities; does work at [employer] so must stand for shifts.   Review of Systems  Constitutional: Positive for fever, chills and fatigue. Negative for diaphoresis.  HENT: Positive for sore throat and trouble swallowing. Negative for congestion, ear discharge, ear pain, postnasal drip, rhinorrhea, sinus pressure, sneezing, tinnitus and voice change.   Respiratory: Positive for cough and shortness of breath. Negative for wheezing.   Cardiovascular: Positive for leg swelling. Negative for chest pain and palpitations.  Gastrointestinal: Negative for nausea, vomiting, abdominal pain and diarrhea.  Musculoskeletal: Positive for myalgias and back pain.  Neurological: Positive for weakness and headaches. Negative for numbness.    Past Medical History  Diagnosis Date  . Hypertension     essential hypertension  . Tobacco abuse     smoking about 1/2 pk of cigarettes a day  . Anxiety   . Depression     psychiatrist Dr. Toy Care  . Respiratory infection   . Shortness of breath   . Malaise   . Lightheadedness   . Emphysematous COPD      changes in right base along with patchy areas on last x-ray  . COPD (chronic obstructive pulmonary disease)   . History of echocardiogram 12/24/2006    Est. EF of 33-29% NORMAL LV SYSTOLIC FUNCTION WITH IMPAIRED RELAXATION -- MILD AORTIC SCLEROSIS -- NORMAL PALONARY ARTERY PRESSURE -- NO OLD ECHOS FOR COMPARISON -- [name], MD  . History of cardiovascular stress test 08/15/2004    EF of 70% -- Normal stress cardiolite.  There is no evidence of ischemia and there is normal LV function. -- [name]. MD  . Diastolic dysfunction   . Allergy   . Asthma   . Heart murmur   . Pneumonia     "several times since May 2015" (07/06/2014)  . Chronic bronchitis     "get it alot; maybe not q yr" (07/06/2014)   Past Surgical History  Procedure Laterality Date  . Tubal ligation  1986   Allergies  Allergen Reactions  . Augmentin [Amoxicillin-Pot Clavulanate]   . Ceclor [Cefaclor]     hives  . Escitalopram Oxalate     Pt does not recall ever taking medication  . Penicillins     rash  . Sertraline Hcl   . Sulfa Drugs Cross Reactors     Hives and rash   Current Outpatient Prescriptions  Medication Sig Dispense Refill  . ALPRAZolam (XANAX) 0.5 MG tablet Take 0.5 mg by mouth 2 (two) times daily.    . Calcium Carbonate-Vitamin D (CALCIUM-VITAMIN D) 500-200 MG-UNIT per tablet Take 1 tablet by mouth daily.    . divalproex (DEPAKOTE ER) 500 MG 24 hr tablet Take 500 mg by mouth at bedtime.     . furosemide (LASIX) 20 MG tablet Take 40 mg by mouth daily.    [name] guaiFENesin (MUCINEX) 600 MG 12 hr tablet Take 600 mg by mouth 2 (two) times daily as needed.    [name] HYDROcodone-homatropine (HYDROMET) 5-1.5 MG/5ML syrup Take 5 mLs by mouth every 6 (six) hours as needed for cough. 240 mL 0  . losartan-hydrochlorothiazide (HYZAAR) 50-12.5 MG per tablet 1/2 tab by mouth once daily    . Multiple Vitamin (MULTIVITAMIN) tablet Take 1 tablet by mouth daily.    . nadolol (CORGARD) 40 MG tablet TAKE 1/2  TABLET BY MOUTH EVERY DAY 45 tablet 1  . nortriptyline (PAMELOR) 50 MG capsule Take 100 mg by mouth at bedtime.    . senna (SENOKOT) 8.6 MG TABS tablet Take 2 tablets (17.2 mg total) by mouth at bedtime. 120 each 0  . Umeclidinium-Vilanterol (ANORO ELLIPTA) 62.5-25 MCG/INH AEPB Inhale 1 puff into the lungs daily. 60 each 5  . albuterol (PROVENTIL HFA;VENTOLIN HFA) 108 (90 BASE) MCG/ACT inhaler Inhale 2 puffs into the lungs every 6 (six) hours as needed for wheezing or shortness of breath. 1 Inhaler 2  . albuterol (PROVENTIL) (2.5 MG/3ML) 0.083% nebulizer solution Take 3 mLs (2.5 mg total) by nebulization every 3 (three) hours as needed for wheezing or shortness of breath. (Patient not taking: Reported on 11/27/2014) 75 mL 12  . cyclobenzaprine (FLEXERIL) 5 MG tablet Take 1 tablet (5 mg total)  by mouth 3 (three) times daily as needed for muscle spasms. (Patient not taking: Reported on 11/27/2014) 30 tablet 1  . furosemide (LASIX) 20 MG tablet TAKE 2 TABLETS (40 MG TOTAL) BY MOUTH DAILY. 180 tablet 1  . HYDROcodone-acetaminophen (NORCO) 5-325 MG per tablet Take 1 tablet by mouth every 8 (eight) hours as needed. 20 tablet 0  . levofloxacin (LEVAQUIN) 750 MG tablet Take 1 tablet (750 mg total) by mouth daily. 10 tablet 0  . methocarbamol (ROBAXIN) 500 MG tablet Take 1 tablet (500 mg total) by mouth 4 (four) times daily. 40 tablet 0  . nystatin (MYCOSTATIN) 100000 UNIT/ML suspension Take 5 mLs (500,000 Units total) by mouth 4 (four) times daily. 200 mL 0  . traMADol (ULTRAM) 50 MG tablet Take 1 tablet by mouth every 6 (six) hours as needed. For pain  0   No current facility-administered medications for this visit.       Objective:    Triage Vitals: BP 100/60 mmHg  Pulse 96  Temp(Src) 98.4 F (36.9 C) (Oral)  Resp 16  Ht 5\' 3"  (1.6 m)  Wt 140 lb (63.504 kg)  BMI 24.81 kg/m2  SpO2 92% Physical Exam  Constitutional: She is oriented to person, place, and time. She appears well-developed and  well-nourished. No distress.  HENT:  Head: Normocephalic and atraumatic.  Right Ear: External ear normal.  Left Ear: External ear normal.  Nose: Nose normal.  Mouth/Throat: Oropharyngeal exudate present.  Throat is diffusely erythematous. Scattered white exudate along the uvula.   Eyes: Conjunctivae and EOM are normal. Pupils are equal, round, and reactive to light.  Neck: Neck supple. No tracheal deviation present. No thyromegaly present.  Cardiovascular: Normal rate, regular rhythm, normal heart sounds and intact distal pulses.  Exam reveals no gallop and no friction rub.   No murmur heard. Pulmonary/Chest: Effort normal. No accessory muscle usage. No tachypnea. No respiratory distress. She has no wheezes. She has rales in the right lower field.  Rales in the right base.  Abdominal: Soft. Bowel sounds are normal. She exhibits no distension and no mass. There is no tenderness. There is no rebound and no guarding.  Musculoskeletal: Normal range of motion. She exhibits edema.       Lumbar back: She exhibits pain. She exhibits normal range of motion, no tenderness, no bony tenderness, no swelling, no edema, no deformity, no laceration, no spasm and normal pulse.  2+ pitting edema to the mid-calf on the left. 1+ pitting edema on the right to the mid-calf. Lumbar spine: straight leg raises negative; toe and heel walking intact; marching intact.  Motor 5/5 BLE.  Lymphadenopathy:    She has cervical adenopathy.  Neurological: She is alert and oriented to person, place, and time.  Skin: Skin is warm and dry. She is not diaphoretic. There is erythema.  Diffuse erythema on the left calf.  Psychiatric: She has a normal mood and affect. Her behavior is normal.  Nursing note and vitals reviewed.  Results for orders placed or performed in visit on 11/27/14  POCT CBC  Result Value Ref Range   WBC 11.0 (A) 4.6 - 10.2 K/uL   Lymph, poc 3.7 (A) 0.6 - 3.4   POC LYMPH PERCENT 33.6 10 - 50 %L   MID  (cbc) 0.4 0 - 0.9   POC MID % 3.9 0 - 12 %M   POC Granulocyte 6.9 2 - 6.9   Granulocyte percent 62.5 37 - 80 %G   RBC 4.37 4.04 -  5.48 M/uL   Hemoglobin 13.0 12.2 - 16.2 g/dL   HCT, POC 39.8 37.7 - 47.9 %   MCV 91.1 80 - 97 fL   MCH, POC 29.8 27 - 31.2 pg   MCHC 32.7 31.8 - 35.4 g/dL   RDW, POC 14.9 %   Platelet Count, POC 314 142 - 424 K/uL   MPV 7.6 0 - 99.8 fL  POCT rapid strep A  Result Value Ref Range   Rapid Strep A Screen Negative Negative  POCT urinalysis dipstick  Result Value Ref Range   Color, UA yellow    Clarity, UA clear    Glucose, UA neg    Bilirubin, UA neg    Ketones, UA neg    Spec Grav, UA 1.010    Blood, UA neg    pH, UA 7.0    Protein, UA neg    Urobilinogen, UA 0.2    Nitrite, UA neg    Leukocytes, UA small (1+)    [facility] reading (PRIMARY) by  Dr. Tamala Julian.  CXR: decreasing/improving RLL infiltrate from last study.  EKG: NSR; no acute changes; rate 92.      Assessment & Plan:   1. Sore throat   2. Leg swelling   3. Bilateral low back pain without sciatica   4. CAP (community acquired pneumonia)   33. Thrush   6. Lumbar back pain with radiculopathy affecting right lower extremity     1. Sore throat:  New.  Onset in past 48 hours; send throat culture.  Consistent with thrush. Treat with Nystatin swish and swallow. 2.  Recurrent Community Acquired Pneumonia: Persistent; s/p Doxycycline; rx for Levaquin provided.  HIV negative.  Refill of Albuterol provided. 3.  Leg swelling chronic with recent worsening: Worsening; continue Lasix 40mg  daily; obtain LE doppler due to L leg more swollen than R leg; s/p two LE dopplers in past two years but warrants rule out of DVT due to recent hospitalization and prolonged illness.  Continue support hose and highly recommend compression stockings. No suggestion of CHF at this time; CXR without pulmonary edema.  Consider repeat echo if persists per Brackbill.  Erythema of L>R leg; Levaquin should cover any evolving  cellulitis; minimally tender at this time and no warmth associated with erythema; RTC for acute worsening redness or development of fever and/or pain.  May warrant MRSA coverage. 4.  B lower back pain/DDD lumbar spine: recurrent; rx for Robaxin and hydrocodone provided.  Recommend rest, stretches. May warrant PT if persists.   Meds ordered this encounter  Medications  . nystatin (MYCOSTATIN) 100000 UNIT/ML suspension    Sig: Take 5 mLs (500,000 Units total) by mouth 4 (four) times daily.    Dispense:  200 mL    Refill:  0  . albuterol (PROVENTIL HFA;VENTOLIN HFA) 108 (90 BASE) MCG/ACT inhaler    Sig: Inhale 2 puffs into the lungs every 6 (six) hours as needed for wheezing or shortness of breath.    Dispense:  1 Inhaler    Refill:  2  . levofloxacin (LEVAQUIN) 750 MG tablet    Sig: Take 1 tablet (750 mg total) by mouth daily.    Dispense:  10 tablet    Refill:  0  . methocarbamol (ROBAXIN) 500 MG tablet    Sig: Take 1 tablet (500 mg total) by mouth 4 (four) times daily.    Dispense:  40 tablet    Refill:  0  . HYDROcodone-acetaminophen (NORCO) 5-325 MG per tablet  Sig: Take 1 tablet by mouth every 8 (eight) hours as needed.    Dispense:  20 tablet    Refill:  0    No Follow-up on file.   I personally performed the services described in this documentation, which was scribed in my presence. The recorded information has been reviewed and considered.   [name], M.D.  [facility] [address] [place], [place]  [address] [phone] phone [phone] fax

## 2014-11-28 ENCOUNTER — Telehealth: Payer: Self-pay

## 2014-11-28 LAB — COMPREHENSIVE METABOLIC PANEL
ALK PHOS: 131 U/L — AB (ref 39–117)
ALT: 21 U/L (ref 0–35)
AST: 25 U/L (ref 0–37)
Albumin: 3.6 g/dL (ref 3.5–5.2)
BUN: 7 mg/dL (ref 6–23)
CO2: 32 mEq/L (ref 19–32)
CREATININE: 0.56 mg/dL (ref 0.50–1.10)
Calcium: 9.4 mg/dL (ref 8.4–10.5)
Chloride: 92 mEq/L — ABNORMAL LOW (ref 96–112)
Glucose, Bld: 99 mg/dL (ref 70–99)
Potassium: 4 mEq/L (ref 3.5–5.3)
SODIUM: 131 meq/L — AB (ref 135–145)
TOTAL PROTEIN: 7.4 g/dL (ref 6.0–8.3)
Total Bilirubin: 0.6 mg/dL (ref 0.2–1.2)

## 2014-11-28 NOTE — Telephone Encounter (Signed)
Pt was not clear in instructions from dr Tamala Julian as far as rechecking. Pt wants to know when to come back in and what tests will need to be done

## 2014-11-28 NOTE — Telephone Encounter (Signed)
[name], MD at 06/07/2014 3:56 PM  furosemide (LASIX) 20 MG tablet  Take 2 tablets (40 mg total) by mouth daily  Patient Instructions:   Your physician recommends that you continue on your current medications as directed.  Please refer to the Current Medication list given to you today.

## 2014-11-29 ENCOUNTER — Other Ambulatory Visit: Payer: Self-pay | Admitting: Radiology

## 2014-11-29 ENCOUNTER — Encounter (HOSPITAL_COMMUNITY): Payer: Managed Care, Other (non HMO)

## 2014-11-29 ENCOUNTER — Telehealth: Payer: Self-pay | Admitting: Radiology

## 2014-11-29 DIAGNOSIS — R600 Localized edema: Secondary | ICD-10-CM

## 2014-11-29 DIAGNOSIS — R2243 Localized swelling, mass and lump, lower limb, bilateral: Secondary | ICD-10-CM

## 2014-11-29 NOTE — Telephone Encounter (Signed)
Patient states she can not go for the doppler study today. The next 2 days are holiday, do you want her to wait until Jan 6th? She states she would prefer to wait.

## 2014-11-29 NOTE — Telephone Encounter (Signed)
Noted.  OK to wait until 12/06/13.

## 2014-11-29 NOTE — Telephone Encounter (Signed)
LM for pt to follow up as needed or sooner if symptoms do not improve or get worse per OV notes.

## 2014-11-30 LAB — CULTURE, GROUP A STREP: Organism ID, Bacteria: NORMAL

## 2014-12-04 ENCOUNTER — Telehealth: Payer: Self-pay

## 2014-12-04 DIAGNOSIS — I8310 Varicose veins of unspecified lower extremity with inflammation: Secondary | ICD-10-CM

## 2014-12-04 NOTE — Telephone Encounter (Signed)
Dr. Tamala Julian:  Patient would like for you to write a prescription for compression socks so she can use her flexible spending account card.  She would like 2 pair if possible.  She would also like the results of her strep screen because she hadn't heard back. Her number is [phone] and her pharmacy is [facility] on [email or web address] is.

## 2014-12-04 NOTE — Telephone Encounter (Signed)
OK to call in/send in rx for compression stockings moderate compression wear during the day and remove at nighttiime Disp: 2 pair of compression stockings  Dx: venous stasis chronic.  OK to advise pt that strep testing negative.

## 2014-12-04 NOTE — Telephone Encounter (Signed)
Culture was negative for strep- please confirm. I have pended compression stockings for pt- please advise.

## 2014-12-05 NOTE — Telephone Encounter (Signed)
LM to advise pt negative cx and stockings have been ordered.

## 2014-12-06 ENCOUNTER — Ambulatory Visit (HOSPITAL_COMMUNITY): Payer: Managed Care, Other (non HMO) | Attending: Cardiovascular Disease | Admitting: *Deleted

## 2014-12-06 DIAGNOSIS — M7989 Other specified soft tissue disorders: Secondary | ICD-10-CM | POA: Diagnosis not present

## 2014-12-06 DIAGNOSIS — M79605 Pain in left leg: Secondary | ICD-10-CM | POA: Diagnosis not present

## 2014-12-06 NOTE — Progress Notes (Signed)
Lower Extremity Venous Duplex Exam - Limited - Left lower extremity negative for DVT

## 2014-12-13 ENCOUNTER — Encounter: Payer: Self-pay | Admitting: Internal Medicine

## 2014-12-13 ENCOUNTER — Ambulatory Visit (INDEPENDENT_AMBULATORY_CARE_PROVIDER_SITE_OTHER): Payer: Managed Care, Other (non HMO) | Admitting: Internal Medicine

## 2014-12-13 VITALS — BP 110/78 | HR 92 | Temp 97.6°F | Ht 63.0 in | Wt 140.0 lb

## 2014-12-13 DIAGNOSIS — J449 Chronic obstructive pulmonary disease, unspecified: Secondary | ICD-10-CM

## 2014-12-13 DIAGNOSIS — F17200 Nicotine dependence, unspecified, uncomplicated: Secondary | ICD-10-CM

## 2014-12-13 DIAGNOSIS — Z720 Tobacco use: Secondary | ICD-10-CM

## 2014-12-13 DIAGNOSIS — Z23 Encounter for immunization: Secondary | ICD-10-CM

## 2014-12-13 DIAGNOSIS — J984 Other disorders of lung: Secondary | ICD-10-CM

## 2014-12-13 DIAGNOSIS — F17210 Nicotine dependence, cigarettes, uncomplicated: Secondary | ICD-10-CM | POA: Insufficient documentation

## 2014-12-13 DIAGNOSIS — J189 Pneumonia, unspecified organism: Secondary | ICD-10-CM

## 2014-12-13 MED ORDER — TIOTROPIUM BROMIDE MONOHYDRATE 18 MCG IN CAPS: 18.0000 ug | ORAL_CAPSULE | Freq: Every day | RESPIRATORY_TRACT | Status: DC

## 2014-12-13 MED ORDER — PREDNISONE 10 MG PO TABS: ORAL_TABLET | ORAL | Status: DC

## 2014-12-13 NOTE — Progress Notes (Signed)
Subjective:    Patient ID: Catherine Kelley, female    DOB: [birth year], 64 y.o.   MRN: [id number]  HPI    OV 12/13/2014  Chief Complaint  Patient presents with  . Follow-up    Pt stated she is no longer doing Anoro d/t it making her cough. Pt c/o weakness. Pt denies SOB and CP/tightness.    Follow-up for several issues  Smoking: She quit smoking in August 2015 after her admission for pneumonia but now she is relapsed. In fact the room smells of tobacco.  Moderate COPD: This was diagnosed in the fall of 2015 in the aftermath of her recovery from right lower lobe community acquired pneumonia. COPD was Gold stage II FEV1 64%. She was given inhaler  ANORO by my self/nurse practitioner. However this visit she says that she stopped this because it was producing cough she only uses albuterol when necessary. She needed to be educated on the difference between rescue therapy and controller medication. Stopping her inhaler as has resulted in slight increased wheezing and some green sputum. Of note she only finished her Levaquin seven-day therapy for cellulitis 2 days ago  Right lower lobe community by pneumonia August 2015: She was hospitalized for this and was on the ventilator. Subsequently follow-up CT scan and chest x-ray showed continued improvement but CT scan of the chest in December 2015 4 months after her pneumonia shows a small right lower lobe cavity along with bronchiectasis and mucous plugging. She continues to have intermittent cough and sputum production. A follow-up CT scan of the chest has not yet been scheduled   Past medical history: A week ago she had cellulitis of her feet and she is finished 7 days of Levaquin therapy. Review the chart shows repeated course of antibiotics in the last few months for respiratory issues as well    Immunization History  Administered Date(s) Administered  . Influenza Split 09/29/2012, 08/31/2013  . Influenza,inj,Quad PF,36+ Mos 09/06/2014,  09/28/2014  . Pneumococcal Polysaccharide-23 08/31/2013    reports that she has been smoking Cigarettes.  She has a 42 pack-year smoking history. She has never used smokeless tobacco.   Review of Systems  Constitutional: Positive for fatigue. Negative for fever and unexpected weight change.  HENT: Negative for congestion, dental problem, ear pain, nosebleeds, postnasal drip, rhinorrhea, sinus pressure, sneezing, sore throat and trouble swallowing.   Eyes: Negative for redness and itching.  Respiratory: Positive for shortness of breath. Negative for cough, chest tightness and wheezing.   Cardiovascular: Negative for palpitations and leg swelling.  Gastrointestinal: Negative for nausea and vomiting.  Genitourinary: Negative for dysuria.  Musculoskeletal: Negative for joint swelling.  Skin: Negative for rash.  Neurological: Negative for headaches.  Hematological: Does not bruise/bleed easily.  Psychiatric/Behavioral: Negative for dysphoric mood. The patient is not nervous/anxious.        Objective:   Physical Exam  Constitutional: She is oriented to person, place, and time. She appears well-developed and well-nourished. No distress.  HENT:  Head: Normocephalic and atraumatic.  Right Ear: External ear normal.  Left Ear: External ear normal.  Mouth/Throat: Oropharynx is clear and moist. No oropharyngeal exudate.  Eyes: Conjunctivae and EOM are normal. Pupils are equal, round, and reactive to light. Right eye exhibits no discharge. Left eye exhibits no discharge. No scleral icterus.  Neck: Normal range of motion. Neck supple. No JVD present. No tracheal deviation present. No thyromegaly present.  Cardiovascular: Normal rate, regular rhythm, normal heart sounds and intact distal pulses.  Exam reveals no gallop and no friction rub.   No murmur heard. Pulmonary/Chest: Effort normal and breath sounds normal. No respiratory distress. She has no wheezes. She has no rales. She exhibits no  tenderness.  Mild scattered wheeze +  Abdominal: Soft. Bowel sounds are normal. She exhibits no distension and no mass. There is no tenderness. There is no rebound and no guarding.  Musculoskeletal: Normal range of motion. She exhibits no edema or tenderness.  Lymphadenopathy:    She has no cervical adenopathy.  Neurological: She is alert and oriented to person, place, and time. She has normal reflexes. No cranial nerve deficit. She exhibits normal muscle tone. Coordination normal.  Skin: Skin is warm and dry. No rash noted. She is not diaphoretic. No erythema. No pallor.  Psychiatric: Judgment and thought content normal.  Poor historian  Vitals reviewed.   Filed Vitals:   12/13/14 1411  BP: 110/78  Pulse: 92  Temp: 97.6 F (36.4 C)  TempSrc: Oral  Height: 5\' 3"  (1.6 m)  Weight: 140 lb (63.504 kg)  SpO2: 96%          Assessment & Plan:     ICD-9-CM ICD-10-CM   1. CAP (community acquired pneumonia) 52 J18.9   2. Pneumonia with cavity of lung 486 J18.9    518.89 J98.4   3. COPD, moderate 496 J44.9   4. Smoking 305.1 Z72.0    #Right lower Lobe necrotizing CAP Pneumonia - Aug 2015  - slow radiological resoution as of CT chest dec 2015 - do CT chest wo contrast as followup in march 2016  #COPD - moderate with mild exacerbation  - you are wheezing some due to you stopping ANORO by yourself  - due to cough with anoro, we will instead start you on spiriva one puff daily - Please take prednisone 40 mg x1 day, then 30 mg x1 day, then 20 mg x1 day, then 10 mg x1 day, and then 5 mg x1 day and stop - use albuterol as needed only - take PREVNAR VAsccine 12/13/2014   #Smoking  - stop smoking please  #Followup CT chest end march 2016 for followup pneumonia and lung cavity REturn to see me end march 2016 after CT ches    Dr. Brand Males, M.D., [facility].C.P Pulmonary and Critical Care Medicine Staff Physician [place] [facility] Pager:  [phone], If no answer or between  15:00h - 7:00h: call [phone]  12/13/2014 2:35 PM

## 2014-12-13 NOTE — Patient Instructions (Addendum)
ICD-9-CM ICD-10-CM   1. CAP (community acquired pneumonia) 74 J18.9   2. Pneumonia with cavity of lung 486 J18.9    518.89 J98.4   3. COPD, moderate 496 J44.9   4. Smoking 305.1 Z72.0      #Right lower Lobe necrotizing CAP Pneumonia - Aug 2015  - slow radiological resoution as of CT chest dec 2015 - do CT chest wo contrast as followup in march 2016  #COPD - moderate with mild exacerbation  - you are wheezing some due to you stopping ANORO by yourself  - due to cough with anoro, we will instead start you on spiriva one puff daily - Please take prednisone 40 mg x1 day, then 30 mg x1 day, then 20 mg x1 day, then 10 mg x1 day, and then 5 mg x1 day and stop - use albuterol as needed only - take PREVNAR VAsccine 12/13/2014   #Smoking  - stop smoking please  #Followup CT chest end march 2016 for followup pneumonia and lung cavity REturn to see me end march 2016 after CT chest

## 2014-12-27 ENCOUNTER — Encounter: Payer: Self-pay | Admitting: Cardiology

## 2014-12-27 ENCOUNTER — Ambulatory Visit (INDEPENDENT_AMBULATORY_CARE_PROVIDER_SITE_OTHER): Payer: Managed Care, Other (non HMO) | Admitting: Cardiology

## 2014-12-27 VITALS — BP 120/78 | HR 86 | Ht 63.0 in | Wt 138.8 lb

## 2014-12-27 DIAGNOSIS — F32A Depression, unspecified: Secondary | ICD-10-CM

## 2014-12-27 DIAGNOSIS — F329 Major depressive disorder, single episode, unspecified: Secondary | ICD-10-CM

## 2014-12-27 DIAGNOSIS — J40 Bronchitis, not specified as acute or chronic: Secondary | ICD-10-CM

## 2014-12-27 DIAGNOSIS — I119 Hypertensive heart disease without heart failure: Secondary | ICD-10-CM

## 2014-12-27 NOTE — Progress Notes (Signed)
Cardiology Office Note   Date:  12/27/2014   ID:  Catherine Kelley, DOB 21-Jun-1951, MRN [id number]  PCP:  [name], MD  Cardiologist:   [name], MD   No chief complaint on file.     History of Present Illness: Catherine Kelley is a 64 y.o. female who presents for   This pleasant 64 year old woman is seen for a scheduled followup office visit. She has a history of essential hypertension. She also has a history of frequent episodes of bronchitis and continues to smoke against advice. We last saw her in July 2015.  She states that in August 2015 she was hospitalized at [facility] with severe necrotizing pneumonia and sepsis.  Following acute care treatment she had to go to a rehabilitation facility for 3 weeks before returning home.  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  Unfortunately despite the severe pulmonary infection that she had, she resumed smoking after returning home.  She reminds me that her husband is a chain smoker. Cardiac standpoint she's been stable with no chest pain or increasing shortness of breath or palpitations. She has a past history of depression.  Past Medical History  Diagnosis Date  . Hypertension     essential hypertension  . Tobacco abuse     smoking about 1/2 pk of cigarettes a day  . Anxiety   . Depression     psychiatrist Dr. Toy Care  . Respiratory infection   . Shortness of breath   . Malaise   . Lightheadedness   . Emphysematous COPD     changes in right base along with patchy areas on last x-ray  . COPD (chronic obstructive pulmonary disease)   . History of echocardiogram 12/24/2006    Est. EF of 13-08% NORMAL LV SYSTOLIC FUNCTION WITH IMPAIRED RELAXATION -- MILD AORTIC SCLEROSIS -- NORMAL PALONARY ARTERY PRESSURE -- NO OLD ECHOS FOR COMPARISON -- [name], MD  . History of cardiovascular stress test 08/15/2004    EF of 70% -- Normal stress cardiolite.  There is no  evidence of ischemia and there is normal LV function. -- Marcello Moores A. Kerianne Gurr. MD  . Diastolic dysfunction   . Allergy   . Asthma   . Heart murmur   . Pneumonia     "several times since May 2015" (07/06/2014)  . Chronic bronchitis     "get it alot; maybe not q yr" (07/06/2014)    Past Surgical History  Procedure Laterality Date  . Tubal ligation  1986     Current Outpatient Prescriptions  Medication Sig Dispense Refill  . albuterol (PROVENTIL HFA;VENTOLIN HFA) 108 (90 BASE) MCG/ACT inhaler Inhale 2 puffs into the lungs every 6 (six) hours as needed for wheezing or shortness of breath. 1 Inhaler 2  . albuterol (PROVENTIL) (2.5 MG/3ML) 0.083% nebulizer solution Take 3 mLs (2.5 mg total) by nebulization every 3 (three) hours as needed for wheezing or shortness of breath. 75 mL 12  . ALPRAZolam (XANAX) 0.5 MG tablet Take 0.5 mg by mouth 2 (two) times daily.    . Calcium Carbonate-Vitamin D (CALCIUM-VITAMIN D) 500-200 MG-UNIT per tablet Take 1 tablet by mouth daily.    . divalproex (DEPAKOTE ER) 500 MG 24 hr tablet Take 500 mg by mouth at bedtime.     . furosemide (LASIX) 20 MG tablet TAKE 2 TABLETS (40 MG TOTAL) BY MOUTH DAILY. 180 tablet 1  . guaiFENesin (MUCINEX) 600  MG 12 hr tablet Take 600 mg by mouth 2 (two) times daily as needed.    [name] HYDROcodone-acetaminophen (NORCO) 5-325 MG per tablet Take 1 tablet by mouth every 8 (eight) hours as needed. 20 tablet 0  . HYDROcodone-homatropine (HYDROMET) 5-1.5 MG/5ML syrup Take 5 mLs by mouth every 6 (six) hours as needed for cough. 240 mL 0  . losartan-hydrochlorothiazide (HYZAAR) 50-12.5 MG per tablet 1/2 tab by mouth once daily    . methocarbamol (ROBAXIN) 500 MG tablet Take 1 tablet (500 mg total) by mouth 4 (four) times daily. 40 tablet 0  . Multiple Vitamin (MULTIVITAMIN) tablet Take 1 tablet by mouth daily.    . nadolol (CORGARD) 40 MG tablet TAKE 1/2 TABLET BY MOUTH EVERY DAY 45 tablet 1  . nortriptyline (PAMELOR) 50 MG capsule Take 100 mg by  mouth at bedtime.    [name] nystatin (MYCOSTATIN) 100000 UNIT/ML suspension Take 5 mLs (500,000 Units total) by mouth 4 (four) times daily. 200 mL 0  . predniSONE (DELTASONE) 10 MG tablet 40 mg x1 day, then 30 mg x1 day, then 20 mg x1 day, then 10 mg x1 day, and then 5 mg x1 day and stop 11 tablet 0  . senna (SENOKOT) 8.6 MG TABS tablet Take 2 tablets (17.2 mg total) by mouth at bedtime. 120 each 0  . tiotropium (SPIRIVA) 18 MCG inhalation capsule Place 1 capsule (18 mcg total) into inhaler and inhale daily. 30 capsule 6   No current facility-administered medications for this visit.    Allergies:   Augmentin; Ceclor; Escitalopram oxalate; Penicillins; Sertraline hcl; and Sulfa drugs cross reactors    Social History:  The patient  reports that she has been smoking Cigarettes.  She has a 42 pack-year smoking history. She has never used smokeless tobacco. She reports that she does not drink alcohol or use illicit drugs.   Family History:  The patient's family history includes Cancer in her maternal aunt; Heart disease in her father and mother; Hyperlipidemia in her father; Hypertension in her father; Stroke in her mother.    ROS:  Please see the history of present illness.   Otherwise, review of systems are positive for none.   All other systems are reviewed and negative.    PHYSICAL EXAM: VS:  BP 120/78 mmHg  Pulse 86  Ht 5\' 3"  (1.6 m)  Wt 138 lb 12.8 oz (62.959 kg)  BMI 24.59 kg/m2  SpO2 91% , BMI Body mass index is 24.59 kg/(m^2). GEN: Well nourished, well developed, in no acute distress HEENT: normal Neck: no JVD, carotid bruits, or masses Cardiac:RRR; no murmurs, rubs, or gallops,no edema  Respiratory:  Rhonchi at right base normal work of breathing GI: soft, nontender, nondistended, + BS MS: no deformity or atrophy Skin: warm and dry, no rash Neuro:  Strength and sensation are intact Psych: euthymic mood, full affect   EKG:  EKG is not ordered today.    Recent  Labs: 07/06/2014: TSH 0.482 07/09/2014: Magnesium 1.3* 07/10/2014: Pro B Natriuretic peptide (BNP) 568.1* 07/12/2014: Platelets 234 11/27/2014: ALT 21; BUN 7; Creatinine 0.56; Hemoglobin 13.0; Potassium 4.0; Sodium 131*    Lipid Panel No results found for: CHOL, TRIG, HDL, CHOLHDL, VLDL, LDLCALC, LDLDIRECT    Wt Readings from Last 3 Encounters:  12/27/14 138 lb 12.8 oz (62.959 kg)  12/13/14 140 lb (63.504 kg)  11/27/14 140 lb (63.504 kg)      Other studies Reviewed: Additional studies/ records that were reviewed today include:  Review of the  above records demonstrates:    ASSESSMENT AND PLAN:  1. Hypertensive heart disease without heart failure 2. Depression 3. tobacco abuse , ongoing 4.  Frequent bronchitis.   Current medicines are reviewed at length with the patient today.  The patient does not have concerns regarding medicines.  The following changes have been made:  no change  Labs/ tests ordered today include: None  No orders of the defined types were placed in this encounter.     Disposition:   FU with Dr. Mare Ferrari in 4 months for office visit.  Continue current medication.  I again strongly urged her to stop smoking altogether.  She still has rhonchi at the right base   Signed, [name], MD  12/27/2014 5:31 PM    [facility] [address], [place], [place]  [address] Phone: [phone]; Fax: [phone]

## 2014-12-27 NOTE — Patient Instructions (Signed)
Your physician recommends that you continue on your current medications as directed. Please refer to the Current Medication list given to you today.  Your physician recommends that you schedule a follow-up appointment in: 4 month ov  

## 2015-01-24 ENCOUNTER — Other Ambulatory Visit: Payer: Self-pay | Admitting: Cardiology

## 2015-02-07 ENCOUNTER — Ambulatory Visit (INDEPENDENT_AMBULATORY_CARE_PROVIDER_SITE_OTHER)
Admission: RE | Admit: 2015-02-07 | Discharge: 2015-02-07 | Disposition: A | Discharge status: Home / Self Care | Payer: Managed Care, Other (non HMO) | Source: Ambulatory Visit | Attending: Internal Medicine | Admitting: Internal Medicine

## 2015-02-07 ENCOUNTER — Inpatient Hospital Stay: Admission: RE | Admit: 2015-02-07 | Payer: Managed Care, Other (non HMO) | Source: Ambulatory Visit

## 2015-02-07 DIAGNOSIS — J984 Other disorders of lung: Secondary | ICD-10-CM

## 2015-02-07 DIAGNOSIS — J189 Pneumonia, unspecified organism: Secondary | ICD-10-CM

## 2015-02-13 ENCOUNTER — Telehealth: Payer: Self-pay | Admitting: Internal Medicine

## 2015-02-13 NOTE — Telephone Encounter (Signed)
lmtcb for pt. Pt will need an appt with TP or MR within a few weeks.

## 2015-02-13 NOTE — Telephone Encounter (Signed)
FU from pneumonia CT chest 02/07/15 is as below  IMPRESSION: Increased airspace disease in the superior segment of the right lower lobe and the inferior aspect of the right middle lobe.  Stable right lower lobe bronchiectasis and mucous plugging. Stable focal right lower lobe consolidation with cavitation, which is likely infectious or inflammatory given interval improvement seen on prior exam. Continued followup by CT recommended.  Stable mild mediastinal lymphadenopathy, likely reactive in etiology. This can also be followed by CT.  Mild emphysema.   Plan   Give her fu next few weeks with TP or me  Dr. Brand Males, M.D., [facility].C.P Pulmonary and Critical Care Medicine Staff Physician [place] [facility] Pager: [phone], If no answer or between  15:00h - 7:00h: call [phone]  02/13/2015 2:37 PM

## 2015-02-14 NOTE — Telephone Encounter (Signed)
Appt made for TP on 3/23 . Pt aware. Nothing further needed.

## 2015-02-14 NOTE — Telephone Encounter (Signed)
lmtcb X1 for pt to schedule appt.  Fyi: TP has an opening at 12:00 next Wednesday.Marland KitchenMarland Kitchen

## 2015-02-14 NOTE — Telephone Encounter (Signed)
Pt would like an appt on Friday or next Wednesday. Please call pt back to make appt  [phone]

## 2015-02-17 ENCOUNTER — Other Ambulatory Visit: Payer: Self-pay | Admitting: Family Medicine

## 2015-02-19 NOTE — Telephone Encounter (Signed)
Dr Tamala Julian, do you want to RF?

## 2015-02-21 ENCOUNTER — Encounter: Payer: Self-pay | Admitting: Adult Health

## 2015-02-21 ENCOUNTER — Ambulatory Visit (INDEPENDENT_AMBULATORY_CARE_PROVIDER_SITE_OTHER): Payer: Managed Care, Other (non HMO) | Admitting: Adult Health

## 2015-02-21 ENCOUNTER — Ambulatory Visit (INDEPENDENT_AMBULATORY_CARE_PROVIDER_SITE_OTHER)
Admission: RE | Admit: 2015-02-21 | Discharge: 2015-02-21 | Disposition: A | Discharge status: Home / Self Care | Payer: Managed Care, Other (non HMO) | Source: Ambulatory Visit | Attending: Adult Health | Admitting: Adult Health

## 2015-02-21 ENCOUNTER — Encounter: Payer: Self-pay | Admitting: *Deleted

## 2015-02-21 ENCOUNTER — Telehealth: Payer: Self-pay | Admitting: Adult Health

## 2015-02-21 VITALS — BP 128/78 | HR 102 | Temp 97.7°F

## 2015-02-21 DIAGNOSIS — J189 Pneumonia, unspecified organism: Secondary | ICD-10-CM | POA: Diagnosis not present

## 2015-02-21 DIAGNOSIS — J984 Other disorders of lung: Secondary | ICD-10-CM

## 2015-02-21 MED ORDER — LEVOFLOXACIN 500 MG PO TABS: 500.0000 mg | ORAL_TABLET | Freq: Every day | ORAL | Status: AC

## 2015-02-21 MED ORDER — HYDROCODONE-HOMATROPINE 5-1.5 MG/5ML PO SYRP: 5.0000 mL | ORAL_SOLUTION | Freq: Four times a day (QID) | ORAL | Status: DC | PRN

## 2015-02-21 NOTE — Progress Notes (Signed)
Subjective:    Patient ID: Catherine Kelley, female    DOB: 1951/05/06, 64 y.o.   MRN: [id number]  HPI   OV 08/03/2014  Chief Complaint  Patient presents with  . Follow-up    HFU- Pt recently d/c from [facility] for CAP.    64 year old previously healthy female with a smoking history. She was admitted early to mid August 2015 with right lower lobe community acquired pneumonia with septic shock. Review of records suggest that she was not intubated but she is coughing up purulent sputum during the course of the stay. Extensive etiologic workup was negative for specific etiology. Says since that culture negative community-acquired right lower lobe pneumonia with necrotizing features. In sputum had mixed flora Patient herself tells me today that she had symptoms dating back to June 2015 and it failed multiple courses of antibiotics. Prior to this she was apparently healthy. She was discharged to a rehabilitation facility 07/14/2014 with advice to continue IV imipenem and linezolid for 9 days. However today along that she still with a PICC line and still on IV antibiotics. She has no sputum. She was using a walker but she is gaining strength and today is the first day without a walker. It is unclear what her discharge plan from the nursing home is. Otherwise she is feeling better according to her and her husband. Her appetite is slowly returning.  I do note that she has not had HIV or autoimmune workup  She feels she is ready to be dc'd out of SNF. Wants to know if she can stand and work in [employer] all day. Lookng at short term disabiliuty  09/06/2014 Follow up  Returns for a 1 month  follow up for Pneumonia  Pt has had a RLL cavitary PNA complicated by septic shock w/ hospitalization in 07/2014  She was discharged on prolonged IV abx w/ Primaxin, Zyvox x 21 days.  Seen last ov w/ d/c of abx . Labs showed autoimmune and HIV tests were neg. (ANA tr positive -not felt to be clinically significant )  Was  discharged to rehab briefly and now back on home  Out of work since discharge, still feels weak .  Quit smoking after discharge , but admits she cheated. But is going to get nicotine patch to help her quit.  Discussed smoking cessation.  Reports breathing has been doing well since last ov, no new complaints.    10/05/14 Acute OV  Complains of sore throat, prod cough with "thick and gewy" off-white mucus x1 week.  On pred taper per PCP for lumbar back pain (10/29) also started on flexeril and vicodin .  She has been doing better until last week .  CXR today shows new right sided densities .  No fever , chest pain, orthopnea, edema or n/v.  Appetite is good .  No trouble swallowing.  Still smoking , advised on smoking cessation  >Levquin 750mg  x 7 d    10/11/14  Follow up PNA  Returns for follow up for PNA .  Was seen 1 week ago found to have PNA .  CXR showed new sided densities  Started on levaquin , took last dose today.  Still smoking , advised on smoking cessation  She denies any overt reflux or dysphagia Patient is feeling some better but has continued cough with thick mucus She also is very fatigued Does not feel like she can go back to work . She denies any hemoptysis,  chest pain, orthopnea, PND, or increased leg swelling  PFT today showed an FEV1 of 1.45 L, which was 64% of predicted, ratio of 72%, no significant bronchodilator response, FVC at 70%, air trapping noted. Diffusing capacity decreased at 67% >>CXR >improving RLL PNA   10/25/2014 Follow up PNA /COPD smoker  Patient returns for a two-week follow-up.  patient was admitted earlier this year in August for a cavitary right sided pneumonia with sepsis.  She was treated with aggressive IV antibiotics, and discharged on prolonged antibiotic course  Her chest x-ray continued to improve  With serial follow-up.  3 weeks ago. Patient had increase cough and congestion. Was found to have new right-sided infiltrates consistent  with pneumonia.  She was started on  Levaquin 750 mg for 7 days.  Chest x-ray 2 weeks ago showed improvement  in right-sided  consolidation  she returns today with improved symptoms. Reports breathing is improved but still having a lot of congestion esp at night tan/yellow/off-white mucus, some discomfort in the right rib area.  Feels the Anoro is working well for her  she continues to smoke but has cut back. We discussed smoking cessation  Appetite  And energy level is improving She denies any fever, hemoptysis, chest pain, orthopnea, or overt reflux nausea, vomiting, or diarrhea  chest x-ray today shows persistent right-sided  Densities with slight progression. >>CT chest    02/21/2015 Acute OV  Present for an acute office visit .  Complains of fever  prod cough (yellow, green) for 1 week then worse for 3 days .  Denies sob, wheezing, or chest discomfort, hemoptyiss , edema or rash.  Working at [employer] .  Last CT chest 02/07/15 showed persistent right sided consolidation .  Wants some cough syrup to help rest , cough keeping her up at night.     Review of Systems  Constitutional:   No  weight loss, night sweats,   +Fevers + fatigue, or  lassitude.  HEENT:   No headaches,  Difficulty swallowing,  Tooth/dental problems, or  Sore throat,                No sneezing, itching, ear ache,  +nasal congestion, post nasal drip,   CV:  No chest pain,  Orthopnea, PND,   anasarca, dizziness, palpitations, syncope.   GI  No heartburn, indigestion, abdominal pain, nausea, vomiting, diarrhea, change in bowel habits, loss of appetite, bloody stools.   Resp:  No chest wall deformity  Skin: no rash or lesions.  GU: no dysuria, change in color of urine, no urgency or frequency.  No flank pain, no hematuria   MS:  No joint pain or swelling.  No decreased range of motion.  No back pain.  Psych:  No change in mood or affect. No depression or anxiety.  No memory loss.            Objective:     Physical Exam  GEN: A/Ox3; pleasant , NAD, well nourished , smells of smoke   HEENT:  [place]/AT,  EACs-clear, TMs-wnl, NOSE-clear, THROAT-clear, no lesions, no postnasal drip or exudate noted.  Poor dentition   NECK:  Supple w/ fair ROM; no JVD; normal carotid impulses w/o bruits; no thyromegaly or nodules palpated; no lymphadenopathy.  RESP  Tr rhonchi , no wheezing or stridor no accessory muscle use, no dullness to percussion  CARD:  RRR, no m/r/g  , no peripheral edema, pulses intact, no cyanosis or clubbing.  GI:   Soft & nt; nml  bowel sounds; no organomegaly or masses detected.  Musco: Warm bil, no deformities or joint swelling noted.   Neuro: alert, no focal deficits noted.    Skin: Warm, no lesions or rashes   10/25/2014 CXR  Progressive right lower lobe infiltrate is present. A component of cavitation cannot be excluded. Left lung is clear  CT chest 11/01/14 >Improved cavitary consolidation in the right lower lobe. There is a persistent cavitary indeterminate pulmonary opacity as described above. This probably represents evolution of the cavitary inflammatory process with scarring. Continued followup until complete resolution with chest CT without contrast in 3 months is Recommended.   CXR Persistent infiltrate with cavitation in the right lower lobe which  Ct CHEST 02/07/15  Increased airspace disease in the superior segment of the right lower lobe and the inferior aspect of the right middle lobe.  Stable right lower lobe bronchiectasis and mucous plugging. Stable focal right lower lobe consolidation with cavitation, which is likely infectious or inflammatory given interval improvement seen on prior exam. Continued followup by CT recommended.  Stable mild mediastinal lymphadenopathy, likely reactive in etiology. This can also be followed by CT.  Mild emphysema.

## 2015-02-21 NOTE — Telephone Encounter (Signed)
Per Janett Billow - we can do work note.  Note has been drafted. Pt is aware that this will be ready for her to pick up.

## 2015-02-21 NOTE — Assessment & Plan Note (Addendum)
Suspected Recurrent PNA with persistent right sided consolidation  tx w/ abx,  On return , if persists consider FOB +/- PET  Check cxr today   Plan  Begin Levaquin 500mg  daily for 10 days  Eat yogurt daily .  Mucinex DM Twice daily  As needed  Cough/congesiton  Continue to work on not smoking .  Follow up Dr. Chase Caller in 2 weeks  and As needed   Please contact office for sooner follow up if symptoms do not improve or worsen or seek emergency care

## 2015-02-21 NOTE — Patient Instructions (Addendum)
Begin Levaquin 500mg  daily for 10 days  Eat yogurt daily .  Mucinex DM Twice daily  As needed  Cough/congesiton  Continue to work on not smoking .  Follow up Dr. Chase Caller in 2 weeks  and As needed   Please contact office for sooner follow up if symptoms do not improve or worsen or seek emergency care

## 2015-02-21 NOTE — Addendum Note (Signed)
Addended by: Parke Poisson E on: 02/21/2015 12:41 PM   Modules accepted: Orders, Medications

## 2015-02-22 NOTE — Progress Notes (Signed)
Quick Note:  LMOM TCB x1. ______ 

## 2015-02-26 ENCOUNTER — Telehealth: Payer: Self-pay | Admitting: Internal Medicine

## 2015-02-26 NOTE — Telephone Encounter (Signed)
Pt is calling back again about her xray results [phone]

## 2015-02-26 NOTE — Telephone Encounter (Signed)
Result Note     Persistent right sided infiltrates w/ probable new infiltrate     tx w/ abx     Needs repeat cxr on return in 2-3 weeks with Dr. Chase Caller     Consider ESR on return    --  I spoke with patient about results and she verbalized understanding.

## 2015-02-26 NOTE — Telephone Encounter (Signed)
Per JJ ok top relay results to pt. Called and spoke to pt. Informed pt of the results and recs per TP. Pt aware to continue with abx and to call back if she feels worse or not improving. Pt verbalized understanding and denied any further questions or concerns at this time.   Notes Recorded by Rinaldo Ratel, CMA on 02/22/2015 at 5:25 PM LMOM TCB x1. ------  Notes Recorded by Melvenia Needles, NP on 02/22/2015 at 2:14 PM Persistent right sided infiltrates w/ probable new infiltrate  tx w/ abx  Needs repeat cxr on return in 2-3 weeks with Dr. Chase Caller  Consider ESR on return

## 2015-03-07 ENCOUNTER — Ambulatory Visit (INDEPENDENT_AMBULATORY_CARE_PROVIDER_SITE_OTHER): Payer: Managed Care, Other (non HMO) | Admitting: Pulmonary Disease

## 2015-03-07 ENCOUNTER — Encounter: Payer: Self-pay | Admitting: Pulmonary Disease

## 2015-03-07 VITALS — BP 112/70 | HR 77 | Temp 97.0°F | Ht 63.0 in | Wt 141.8 lb

## 2015-03-07 DIAGNOSIS — J984 Other disorders of lung: Secondary | ICD-10-CM

## 2015-03-07 DIAGNOSIS — J471 Bronchiectasis with (acute) exacerbation: Secondary | ICD-10-CM

## 2015-03-07 DIAGNOSIS — J189 Pneumonia, unspecified organism: Secondary | ICD-10-CM | POA: Diagnosis not present

## 2015-03-07 MED ORDER — AMOXICILLIN-POT CLAVULANATE 875-125 MG PO TABS: 1.0000 | ORAL_TABLET | Freq: Two times a day (BID) | ORAL | Status: DC

## 2015-03-07 NOTE — Progress Notes (Signed)
   Subjective:    Patient ID: Catherine Kelley, female    DOB: 1951/01/08, 64 y.o.   MRN: [id number]  HPI The patient comes in today for an acute sick visit. She has a history of very extensive right lower lobe bronchiectasis with mucus plugging, and has had recurrent pneumonias despite aggressive treatment with antibiotics. We have never cultured an organism out of her sputum, and most recently has completed a 10 day course of Levaquin. Her most recent chest CT and x-ray has shown a cavitary/necrotizing infiltrate in the right lower lobe, superimposed over her bronchiectasis. Since being off her Levaquin, she has had increasing malaise, night time fevers, and increasing cough and congestion.   Review of Systems  Constitutional: Positive for fever and chills. Negative for unexpected weight change.  HENT: Positive for congestion. Negative for dental problem, ear pain, nosebleeds, postnasal drip, rhinorrhea, sinus pressure, sneezing, sore throat and trouble swallowing.   Eyes: Negative for redness and itching.  Respiratory: Positive for cough, shortness of breath and wheezing. Negative for chest tightness.   Cardiovascular: Negative for palpitations and leg swelling.  Gastrointestinal: Negative for nausea and vomiting.  Genitourinary: Negative for dysuria.  Musculoskeletal: Negative for joint swelling.  Skin: Negative for rash.  Neurological: Negative for headaches.  Hematological: Does not bruise/bleed easily.  Psychiatric/Behavioral: Negative for dysphoric mood. The patient is not nervous/anxious.        Objective:   Physical Exam Overweight female in no acute distress Nose without purulence or discharge noted Neck without lymphadenopathy or thyromegaly Chest with crackles in both bases, right greater than left, but no wheezing Cardiac exam with regular rate and rhythm Lower extremities with 1+ edema and varicosities, no cyanosis Alert and oriented, moves all 4 extremities.         Assessment & Plan:

## 2015-03-07 NOTE — Assessment & Plan Note (Signed)
The patient has severe bronchiectasis in the right lower lobe, along with recurrent pulmonary infections and documented cavitary airspace disease most recently. Been treated with multiple rounds of antibiotics which helped transiently, but currently she is having a return of malaise, fever, and increased congestion with purulent mucus. We have never cultured anything out of her sputum. It is unclear if we are dealing with a resistant organism or possibly anaerobes given her poor dentition, whether we have never treated her infection long enough to make an impact, or whether she simply has chronic ongoing infection in the right lower lobe that will benefit from lobectomy in order to resolve her ongoing issues. She has never been treated with Augmentin because of that listed as an allergy. She tells me that she had an injection of penicillin and had a local reaction, but did not have a life-threatening reaction or hives. I would like to try her on this since she has a cavitary infiltrate on x-ray, and she has very poor dentition. She also has been treated a lot with Levaquin, and sometimes this is not the best antibiotic for more resistant gram-negative rods. If she does not improve with the Augmentin, I would recommend bronchoscopy to sample her lower airway and see if she has a resistant bacteria. Ultimately, her best treatment may be lobectomy given the severity of her bronchiectasis and chronic mucus plugging of the airways.

## 2015-03-07 NOTE — Patient Instructions (Signed)
Will treat you with augmentin 875 am and pm for 14 days.  You have told me that you did not have a life threatening reaction, and no rashes or hives (just a local reaction at injection site) followup with our nurse practitioner in 2 weeks with chest xray.  Let us know if you are not improving.

## 2015-03-08 ENCOUNTER — Ambulatory Visit: Payer: Managed Care, Other (non HMO) | Admitting: Adult Health

## 2015-03-14 ENCOUNTER — Ambulatory Visit (INDEPENDENT_AMBULATORY_CARE_PROVIDER_SITE_OTHER)
Admission: RE | Admit: 2015-03-14 | Discharge: 2015-03-14 | Disposition: A | Discharge status: Home / Self Care | Payer: Managed Care, Other (non HMO) | Source: Ambulatory Visit | Attending: Internal Medicine | Admitting: Internal Medicine

## 2015-03-14 ENCOUNTER — Ambulatory Visit (INDEPENDENT_AMBULATORY_CARE_PROVIDER_SITE_OTHER): Payer: Managed Care, Other (non HMO) | Admitting: Internal Medicine

## 2015-03-14 VITALS — BP 112/72 | HR 89 | Ht 63.0 in | Wt 139.0 lb

## 2015-03-14 DIAGNOSIS — J984 Other disorders of lung: Secondary | ICD-10-CM

## 2015-03-14 DIAGNOSIS — J189 Pneumonia, unspecified organism: Secondary | ICD-10-CM | POA: Diagnosis not present

## 2015-03-14 DIAGNOSIS — J479 Bronchiectasis, uncomplicated: Secondary | ICD-10-CM

## 2015-03-14 MED ORDER — AMOXICILLIN-POT CLAVULANATE 875-125 MG PO TABS: 1.0000 | ORAL_TABLET | Freq: Two times a day (BID) | ORAL | Status: DC

## 2015-03-14 MED ORDER — ALBUTEROL SULFATE (2.5 MG/3ML) 0.083% IN NEBU: 2.5000 mg | INHALATION_SOLUTION | RESPIRATORY_TRACT | Status: DC | PRN

## 2015-03-14 NOTE — Progress Notes (Signed)
Subjective:    Patient ID: Catherine Kelley, female    DOB: [birth year], 64 y.o.   MRN: [id number]  HPI  OV 03/14/2015  Chief Complaint  Patient presents with  . CAP    Breathing is improved since last OV. Still on Augmentin.     OV 08/03/2014  Chief Complaint  Patient presents with  . Follow-up    HFU- Pt recently d/c from [facility] for CAP.    64 year old previously healthy female with a smoking history. She was admitted early to mid August 2015 with right lower lobe community acquired pneumonia with septic shock. Review of records suggest that she was not intubated but she is coughing up purulent sputum during the course of the stay. Extensive etiologic workup was negative for specific etiology. Says since that culture negative community-acquired right lower lobe pneumonia with necrotizing features. In sputum had mixed flora Patient herself tells me today that she had symptoms dating back to June 2015 and it failed multiple courses of antibiotics. Prior to this she was apparently healthy. She was discharged to a rehabilitation facility 07/14/2014 with advice to continue IV imipenem and linezolid for 9 days. However today along that she still with a PICC line and still on IV antibiotics. She has no sputum. She was using a walker but she is gaining strength and today is the first day without a walker. It is unclear what her discharge plan from the nursing home is. Otherwise she is feeling better according to her and her husband. Her appetite is slowly returning.  I do note that she has not had HIV or autoimmune workup  She feels she is ready to be dc'd out of SNF. Wants to know if she can stand and work in [employer] all day. Lookng at short term disabiliuty  09/06/2014 Follow up  Returns for a 1 month  follow up for Pneumonia  Pt has had a RLL cavitary PNA complicated by septic shock w/ hospitalization in 07/2014  She was discharged on prolonged IV abx w/ Primaxin, Zyvox x 21 days.  Seen last ov w/ d/c  of abx . Labs showed autoimmune and HIV tests were neg. (ANA tr positive -not felt to be clinically significant )  Was discharged to rehab briefly and now back on home  Out of work since discharge, still feels weak .  Quit smoking after discharge , but admits she cheated. But is going to get nicotine patch to help her quit.  Discussed smoking cessation.  Reports breathing has been doing well since last ov, no new complaints.    10/05/14 Acute OV  Complains of sore throat, prod cough with "thick and gewy" off-white mucus x1 week.  On pred taper per PCP for lumbar back pain (10/29) also started on flexeril and vicodin .  She has been doing better until last week .  CXR today shows new right sided densities .  No fever , chest pain, orthopnea, edema or n/v.  Appetite is good .  No trouble swallowing.  Still smoking , advised on smoking cessation  >Levquin 750mg  x 7 d    10/11/14  Follow up PNA  Returns for follow up for PNA .  Was seen 1 week ago found to have PNA .  CXR showed new sided densities  Started on levaquin , took last dose today.  Still smoking , advised on smoking cessation  She denies any overt reflux or dysphagia Patient is feeling some better but has continued cough with  thick mucus She also is very fatigued Does not feel like she can go back to work . She denies any hemoptysis, chest pain, orthopnea, PND, or increased leg swelling  PFT today showed an FEV1 of 1.45 L, which was 64% of predicted, ratio of 72%, no significant bronchodilator response, FVC at 70%, air trapping noted. Diffusing capacity decreased at 67% >>CXR >improving RLL PNA   10/25/2014 Follow up PNA /COPD smoker  Patient returns for a two-week follow-up.  patient was admitted earlier this year in August for a cavitary right sided pneumonia with sepsis.  She was treated with aggressive IV antibiotics, and discharged on prolonged antibiotic course  Her chest x-ray continued to improve  With serial  follow-up.  3 weeks ago. Patient had increase cough and congestion. Was found to have new right-sided infiltrates consistent with pneumonia.  She was started on  Levaquin 750 mg for 7 days.  Chest x-ray 2 weeks ago showed improvement  in right-sided  consolidation  she returns today with improved symptoms. Reports breathing is improved but still having a lot of congestion esp at night tan/yellow/off-white mucus, some discomfort in the right rib area.  Feels the Anoro is working well for her  she continues to smoke but has cut back. We discussed smoking cessation  Appetite  And energy level is improving She denies any fever, hemoptysis, chest pain, orthopnea, or overt reflux nausea, vomiting, or diarrhea  chest x-ray today shows persistent right-sided  Densities with slight progression. >>CT chest    02/21/2015 Acute OV  Present for an acute office visit .  Complains of fever  prod cough (yellow, green) for 1 week then worse for 3 days .  Denies sob, wheezing, or chest discomfort, hemoptyiss , edema or rash.  Working at [employer] .  Last CT chest 02/07/15 showed severe redsidual bronchiectasis RLL with severe plugging Wants some cough syrup to help rest , cough keeping her up at night.     OV 03/14/2015  Chief Complaint  Patient presents with  . CAP    Breathing is improved since last OV. Still on Augmentin.    Follow-up right lower lobe necrotizing pneumonia from summer 2015. She now  has residual right lower lobe bronchiectasis with severe mucus plugging and recurrent exacerbations as evidence on CT scan of the chest March 2016. She most recently a week ago saw my colleague Dr. Danton Sewer for an exacerbation. Given Augmentin for 2 weeks. She's finished one week of Augmentin and is feeling better. She reports that even at baseline she has chronic sputum production that is discolored. She is unable to quantify the amount but she says it is significant. Current Augmentin therapy has helped  her. Overall she is frustrated by her quality of life and the chronic cough unexpected in production and repeated exacerbations.   Dg Chest 2 View  03/14/2015   CLINICAL DATA:  Cough, shortness of breath, followup pneumonia  EXAM: CHEST  2 VIEW  COMPARISON:  Chest x-ray of 02/21/2015 and CT chest of 02/06/2005  FINDINGS: There is little change in opacity within the right lower lobe most consistent with pneumonia. There is again noted to be 8 and focus of cavitation within the right lower low which was demonstrated on prior CT. No new parenchymal infiltrate is seen. No pleural effusion is noted. Mediastinal and hilar contours are unremarkable. The heart is within normal limits in size. No bony abnormality is seen.  IMPRESSION: Little change in probable pneumonia in the right lower  lobe with an area of cavitation as demonstrated by prior CT. No new abnormality.   Electronically Signed   By: Ivar Drape M.D.   On: 03/14/2015 16:26      Current outpatient prescriptions:  .  albuterol (PROVENTIL HFA;VENTOLIN HFA) 108 (90 BASE) MCG/ACT inhaler, Inhale 2 puffs into the lungs every 6 (six) hours as needed for wheezing or shortness of breath., Disp: 1 Inhaler, Rfl: 2 .  ALPRAZolam (XANAX) 0.5 MG tablet, Take 0.5 mg by mouth 2 (two) times daily., Disp: , Rfl:  .  amoxicillin-clavulanate (AUGMENTIN) 875-125 MG per tablet, Take 1 tablet by mouth 2 (two) times daily., Disp: 28 tablet, Rfl: 0 .  Calcium Carbonate-Vitamin D (CALCIUM-VITAMIN D) 500-200 MG-UNIT per tablet, Take 1 tablet by mouth daily., Disp: , Rfl:  .  divalproex (DEPAKOTE ER) 500 MG 24 hr tablet, Take 500 mg by mouth at bedtime. , Disp: , Rfl:  .  furosemide (LASIX) 20 MG tablet, TAKE 2 TABLETS (40 MG TOTAL) BY MOUTH DAILY., Disp: 180 tablet, Rfl: 1 .  guaiFENesin (MUCINEX) 600 MG 12 hr tablet, Take 600 mg by mouth 2 (two) times daily as needed., Disp: , Rfl:  .  losartan-hydrochlorothiazide (HYZAAR) 50-12.5 MG per tablet, 1/2 tab by mouth once  daily, Disp: , Rfl:  .  methocarbamol (ROBAXIN) 500 MG tablet, TAKE 1 TABLET (500 MG TOTAL) BY MOUTH 4 (FOUR) TIMES DAILY., Disp: 40 tablet, Rfl: 2 .  Multiple Vitamin (MULTIVITAMIN) tablet, Take 1 tablet by mouth daily., Disp: , Rfl:  .  nadolol (CORGARD) 40 MG tablet, TAKE 1/2 TABLET BY MOUTH EVERY DAY, Disp: 45 tablet, Rfl: 0 .  nortriptyline (PAMELOR) 50 MG capsule, Take 100 mg by mouth at bedtime., Disp: , Rfl:  .  senna (SENOKOT) 8.6 MG TABS tablet, Take 2 tablets (17.2 mg total) by mouth at bedtime., Disp: 120 each, Rfl: 0 .  tiotropium (SPIRIVA) 18 MCG inhalation capsule, Place 1 capsule (18 mcg total) into inhaler and inhale daily., Disp: 30 capsule, Rfl: 6 .  albuterol (PROVENTIL) (2.5 MG/3ML) 0.083% nebulizer solution, Take 3 mLs (2.5 mg total) by nebulization every 3 (three) hours as needed for wheezing or shortness of breath. (Patient not taking: Reported on 03/14/2015), Disp: 75 mL, Rfl: 12 .  HYDROcodone-acetaminophen (NORCO) 5-325 MG per tablet, Take 1 tablet by mouth every 8 (eight) hours as needed. (Patient not taking: Reported on 03/14/2015), Disp: 20 tablet, Rfl: 0 .  HYDROcodone-homatropine (HYDROMET) 5-1.5 MG/5ML syrup, Take 5 mLs by mouth every 6 (six) hours as needed for cough. (Patient not taking: Reported on 03/14/2015), Disp: 240 mL, Rfl: 0   Review of Systems     Objective:   Physical Exam  Constitutional: She is oriented to person, place, and time. She appears well-developed and well-nourished. No distress.  Body mass index is 24.63 kg/(m^2).   HENT:  Head: Normocephalic and atraumatic.  Right Ear: External ear normal.  Left Ear: External ear normal.  Mouth/Throat: Oropharynx is clear and moist. No oropharyngeal exudate.  Eyes: Conjunctivae and EOM are normal. Pupils are equal, round, and reactive to light. Right eye exhibits no discharge. Left eye exhibits no discharge. No scleral icterus.  Neck: Normal range of motion. Neck supple. No JVD present. No tracheal  deviation present. No thyromegaly present.  Cardiovascular: Normal rate, regular rhythm, normal heart sounds and intact distal pulses.  Exam reveals no gallop and no friction rub.   No murmur heard. Pulmonary/Chest: Effort normal. No respiratory distress. She has no wheezes. She  has rales. She exhibits no tenderness.  RT lower lobe crackles +  Abdominal: Soft. Bowel sounds are normal. She exhibits no distension and no mass. There is no tenderness. There is no rebound and no guarding.  Musculoskeletal: Normal range of motion. She exhibits no edema or tenderness.  A bit deconditioned +  Lymphadenopathy:    She has no cervical adenopathy.  Neurological: She is alert and oriented to person, place, and time. She has normal reflexes. No cranial nerve deficit. She exhibits normal muscle tone. Coordination normal.  Skin: Skin is warm and dry. No rash noted. She is not diaphoretic. No erythema. No pallor.  Psychiatric: Judgment and thought content normal.  Vitals reviewed.   Filed Vitals:   03/14/15 1632 03/14/15 1633  BP:  112/72  Pulse:  89  Height: 5\' 3"  (1.6 m)   Weight: 139 lb (63.05 kg)   SpO2:  91%         Assessment & Plan:     ICD-9-CM ICD-10-CM   1. Pneumonia with cavity of lung 486 J18.9 DG Chest 2 View   518.89 J98.4 Pulmonary function test  2. Bronchiectasis, non-tuberculous 494.0 J47.9 Pulmonary function test    Glad better after 1 week of augmentin We discussed the fact you have residual severe Right lower lobe lung bronchiectasis  - this happened following your severe right lower lobe pneumonia in summer 2015 Ideal treatment is surgical removal of that right lower lobe   - as agreed we will hold off on that right now due to concern of morbidity  - we will try conservative measures as below  PLAN Continue augmentin for total 3 weeks  - extend treatment given by Dr Gwenette Greet last week by extra 1 week Continue spiriva daily We will make a request for vibratory  vest  Followup PFT in 6 weeks Return to see me in 6 weeks At some point you might need bronchoscopy  > 50% of this > 25 min visit spent in face to face counseling or coordination of care (15 min visit converted to 25 min)   Dr. Brand Males, M.D., [facility].C.P Pulmonary and Critical Care Medicine Staff Physician [place] [facility] Pager: [phone], If no answer or between  15:00h - 7:00h: call [phone]  03/15/2015 9:20 AM

## 2015-03-14 NOTE — Patient Instructions (Addendum)
     ICD-9-CM ICD-10-CM   1. Pneumonia with cavity of lung 486 J18.9 DG Chest 2 View   518.89 J98.4   2. Bronchiectasis, non-tuberculous 494.0 J47.9    Glad better after 1 week of augmentin We discussed the fact you have residual severe Right lower lobe lung bronchiectasis  - this happened following your severe right lower lobe pneumonia in summer 2015 Ideal treatment is surgical removal of that right lower lobe   - as agreed we will hold off on that right now  - we will try conservative measures as below  PLAN Continue augmentin for total 3 weeks  - extend treatment given by Dr Gwenette Greet last week by extra 1 week Continue spiriva daily We will make a request for vibratory vest  Followup PFT in 6 weeks Return to see me in 6 weeks At some point you might need bronchoscopy

## 2015-03-15 ENCOUNTER — Encounter: Payer: Self-pay | Admitting: Internal Medicine

## 2015-04-16 ENCOUNTER — Telehealth: Payer: Self-pay | Admitting: Internal Medicine

## 2015-04-16 MED ORDER — ALBUTEROL SULFATE HFA 108 (90 BASE) MCG/ACT IN AERS: 2.0000 | INHALATION_SPRAY | Freq: Four times a day (QID) | RESPIRATORY_TRACT | Status: DC | PRN

## 2015-04-16 NOTE — Telephone Encounter (Signed)
Called and spoke to pt. Pt requesting refill on albuterol HFA. Rx sent to preferred pharmacy. Pt also questioning if MR can prescribe a type of patch to help her quite smoking. Pt stated she wants a particular patch but is unsure of the name. Advised pt to call back with the specifics and we will ask MR. Pt verbalized understanding and stated she will call back.

## 2015-04-17 NOTE — Telephone Encounter (Signed)
lmtcb for pt to see if she found the name of the specific med she wants MR to prescribe.

## 2015-04-18 NOTE — Telephone Encounter (Signed)
Spoke with pt. Advised her that we needed to know the brand name of the patch >> Nicoderm? States she has no idea and wants MR to make the decision.  MR - please advise on patch and strength. Thanks.

## 2015-04-18 NOTE — Telephone Encounter (Signed)
lmtcb for pt.  

## 2015-04-18 NOTE — Telephone Encounter (Signed)
Pt is returning call The patch is called a nicotine patch.  [phone]

## 2015-04-19 ENCOUNTER — Encounter: Payer: Self-pay | Admitting: Family Medicine

## 2015-04-19 ENCOUNTER — Ambulatory Visit (INDEPENDENT_AMBULATORY_CARE_PROVIDER_SITE_OTHER): Payer: Managed Care, Other (non HMO)

## 2015-04-19 ENCOUNTER — Ambulatory Visit (INDEPENDENT_AMBULATORY_CARE_PROVIDER_SITE_OTHER): Payer: Managed Care, Other (non HMO) | Admitting: Family Medicine

## 2015-04-19 VITALS — BP 110/68 | HR 97 | Temp 97.5°F | Resp 20 | Ht 63.0 in | Wt 145.2 lb

## 2015-04-19 DIAGNOSIS — M5416 Radiculopathy, lumbar region: Secondary | ICD-10-CM

## 2015-04-19 DIAGNOSIS — M545 Low back pain, unspecified: Secondary | ICD-10-CM

## 2015-04-19 DIAGNOSIS — J040 Acute laryngitis: Secondary | ICD-10-CM | POA: Diagnosis not present

## 2015-04-19 DIAGNOSIS — M5417 Radiculopathy, lumbosacral region: Secondary | ICD-10-CM

## 2015-04-19 DIAGNOSIS — E871 Hypo-osmolality and hyponatremia: Secondary | ICD-10-CM | POA: Diagnosis not present

## 2015-04-19 LAB — POCT CBC
GRANULOCYTE PERCENT: 66.6 % (ref 37–80)
HCT, POC: 39.2 % (ref 37.7–47.9)
Hemoglobin: 12.8 g/dL (ref 12.2–16.2)
Lymph, poc: 2.3 (ref 0.6–3.4)
MCH, POC: 29.6 pg (ref 27–31.2)
MCHC: 32.7 g/dL (ref 31.8–35.4)
MCV: 90.4 fL (ref 80–97)
MID (CBC): 0.5 (ref 0–0.9)
MPV: 7.3 fL (ref 0–99.8)
PLATELET COUNT, POC: 263 10*3/uL (ref 142–424)
POC GRANULOCYTE: 5.5 (ref 2–6.9)
POC LYMPH %: 27.8 % (ref 10–50)
POC MID %: 5.6 %M (ref 0–12)
RBC: 4.33 M/uL (ref 4.04–5.48)
RDW, POC: 13.5 %
WBC: 8.2 10*3/uL (ref 4.6–10.2)

## 2015-04-19 MED ORDER — IPRATROPIUM BROMIDE 0.03 % NA SOLN: 2.0000 | Freq: Two times a day (BID) | NASAL | Status: DC

## 2015-04-19 MED ORDER — HYDROCODONE-ACETAMINOPHEN 5-325 MG PO TABS: 1.0000 | ORAL_TABLET | Freq: Four times a day (QID) | ORAL | Status: DC | PRN

## 2015-04-19 MED ORDER — METHOCARBAMOL 500 MG PO TABS: ORAL_TABLET | ORAL | Status: DC

## 2015-04-19 MED ORDER — BENZONATATE 100 MG PO CAPS: 100.0000 mg | ORAL_CAPSULE | Freq: Three times a day (TID) | ORAL | Status: DC | PRN

## 2015-04-19 MED ORDER — PREDNISONE 20 MG PO TABS: ORAL_TABLET | ORAL | Status: DC

## 2015-04-19 NOTE — Telephone Encounter (Signed)
lmomtcb x1 

## 2015-04-19 NOTE — Telephone Encounter (Signed)
Nicotine patch is OTC. She can pick up brands based on cost.    Instructions .Starting on the quit day use the highest dose of the nicotine patch (21 mg/day) for six weeks, followed by 14 mg/day for two weeks, and finish with 7 mg/day for two weeks.   To use the nicotine patch, the smoker applies one patch each morning to any non-hairy skin site. It is removed and replaced with a new patch the next morning. The patch site should be rotated daily to avoid skin irritation, which is the most common side effect.  Insomnia and vivid dreams are frequently reported when the patch is left on overnight. These can be minimized by removing the patch at bedtime. . If the patch is removed at night, substantial plasma levels of nicotine are reached 30 minutes to three hours after a new patch is applied in the morning   Patients who remove the patch at night and experience morning cravings for nicotine can use a short-acting form of NRT (eg, gum, lozenge) while waiting for the nicotine patch to take effect.  Total Rx - 8-12 weeks needed    Dr. Brand Males, M.D., [facility].C.P Pulmonary and Critical Care Medicine Staff Physician [place] [facility] Pager: [phone], If no answer or between  15:00h - 7:00h: call [phone]  04/19/2015 4:22 PM

## 2015-04-19 NOTE — Progress Notes (Addendum)
Subjective:    Patient ID: Catherine Kelley, female    DOB: [birth year], 64 y.o.   MRN: [id number] This chart was scribed for [name], MD by Marti Sleigh, Medical Scribe. This patient was seen in Room 14 and the patient's care was started at 8:44 PM.  Chief Complaint  Patient presents with  . Laryngitis    x 3 days  . Cough    green sputum x 3 days  . Nasal Congestion    yellow congestion x 3 days    04/19/2015  Laryngitis; Cough; and Nasal Congestion   HPI  HPI Comments: Catherine Kelley is a 64 y.o. female who presents to [facility] complaining of cough and nasal congestion with associated SOB for the last month. Pt states she was advised by pulmonology that she needed to have the right lower lobe of her lung removed, which she declined. She was told at that point that she would likely be on mucinex and abx regularly for the rest of her life.   Pt states she has had fever, chills, night sweats, HA, sore throat, rhinorrhea, and postnasal drip associated with her sx. Pt denies ear pain. Pt. Was recently seen by Dr. Gwenette Greet, and was prescribed 875 mg of amoxicillin/clavulonic acid for one week. Pt was then seen by her pulmonologist Dr Lynford Citizen, who kept her on amoxicillin for another week. Pt states she had nausea and dry heaves one day.   Pt also states she has ongoing back pain which radiates down her right leg. Pt states her back pain has been 10/10 at times. Pt endorses intermittent associated numbness and tingling. Pt states she has seen Dr. Maxie Better with [facility] orthopedics for her back after a fall some time ago, and was told at that time she had two pinched nerves and DDD.   Pt states she works full time.    Review of Systems  Constitutional: Positive for fever and chills. Negative for diaphoresis and fatigue.  HENT: Positive for congestion, postnasal drip, rhinorrhea, sneezing, sore throat and voice change. Negative for ear discharge, ear pain, sinus pressure and trouble swallowing.     Respiratory: Positive for cough and shortness of breath. Negative for wheezing.   Cardiovascular: Positive for leg swelling. Negative for chest pain and palpitations.  Gastrointestinal: Negative for nausea, vomiting, abdominal pain and diarrhea.  Genitourinary: Negative for decreased urine volume and difficulty urinating.  Musculoskeletal: Positive for back pain and gait problem.  Skin: Negative for rash.  Neurological: Positive for numbness. Negative for weakness.    Past Medical History  Diagnosis Date  . Hypertension     essential hypertension  . Tobacco abuse     smoking about 1/2 pk of cigarettes a day  . Anxiety   . Depression     psychiatrist Dr. Toy Care  . Respiratory infection   . Shortness of breath   . Malaise   . Lightheadedness   . Emphysematous COPD     changes in right base along with patchy areas on last x-ray  . COPD (chronic obstructive pulmonary disease)   . History of echocardiogram 12/24/2006    Est. EF of 62-95% NORMAL LV SYSTOLIC FUNCTION WITH IMPAIRED RELAXATION -- MILD AORTIC SCLEROSIS -- NORMAL PALONARY ARTERY PRESSURE -- NO OLD ECHOS FOR COMPARISON -- [name], MD  . History of cardiovascular stress test 08/15/2004    EF of 70% -- Normal stress cardiolite.  There is no evidence of ischemia and there is normal LV function. -- Marcello Moores A.  Brackbill. MD  . Diastolic dysfunction   . Allergy   . Asthma   . Heart murmur   . Pneumonia     "several times since May 2015" (07/06/2014)  . Chronic bronchitis     "get it alot; maybe not q yr" (07/06/2014)   Past Surgical History  Procedure Laterality Date  . Tubal ligation  1986   Allergies  Allergen Reactions  . Augmentin [Amoxicillin-Pot Clavulanate]     UNKNOWN  . Ceclor [Cefaclor]     hives  . Escitalopram Oxalate     Pt does not recall ever taking medication  . Penicillins     rash  . Sertraline Hcl     UNKNOWN  . Sulfa Drugs Cross Reactors     Hives and rash        Objective:    BP  110/68 mmHg  Pulse 97  Temp(Src) 97.5 F (36.4 C) (Oral)  Resp 20  Ht '5\' 3"'$  (1.6 m)  Wt 145 lb 4 oz (65.885 kg)  BMI 25.74 kg/m2  SpO2 92% Physical Exam  Constitutional: She is oriented to person, place, and time. She appears well-developed and well-nourished. No distress.  HENT:  Head: Normocephalic and atraumatic.  Right Ear: Tympanic membrane, external ear and ear canal normal.  Left Ear: Tympanic membrane, external ear and ear canal normal.  Nose: Mucosal edema and rhinorrhea present. Right sinus exhibits no maxillary sinus tenderness and no frontal sinus tenderness. Left sinus exhibits no frontal sinus tenderness.  Mouth/Throat: Oropharynx is clear and moist. No oropharyngeal exudate.  Eyes: Conjunctivae and EOM are normal. Pupils are equal, round, and reactive to light.  Neck: Neck supple. No thyromegaly present.  Cardiovascular: Normal rate, regular rhythm and normal heart sounds.  Exam reveals no gallop and no friction rub.   No murmur heard. Pulmonary/Chest: Effort normal. No respiratory distress. She has decreased breath sounds in the right lower field. She has no wheezes. She has no rhonchi. She has rales in the right lower field.  Musculoskeletal: Normal range of motion.       Lumbar back: She exhibits pain. She exhibits normal range of motion, no tenderness, no bony tenderness, no spasm and normal pulse.  Lumbar spine:  Non-tender midline; non-tender paraspinal regions B.  Straight leg raises negative B; toe and heel walking intact; marching intact; motor 5/5 BLE.  Full ROM lumbar spine without limitation. ROM of lumbar spine w/o limitation.  Lymphadenopathy:    She has no cervical adenopathy.  Neurological: She is alert and oriented to person, place, and time. Coordination normal.  Skin: Skin is warm and dry. She is not diaphoretic.  Psychiatric: She has a normal mood and affect. Her behavior is normal.  Nursing note and vitals reviewed.  Results for orders placed or  performed in visit on 04/19/15  POCT CBC  Result Value Ref Range   WBC 8.2 4.6 - 10.2 K/uL   Lymph, poc 2.3 0.6 - 3.4   POC LYMPH PERCENT 27.8 10 - 50 %L   MID (cbc) 0.5 0 - 0.9   POC MID % 5.6 0 - 12 %M   POC Granulocyte 5.5 2 - 6.9   Granulocyte percent 66.6 37 - 80 %G   RBC 4.33 4.04 - 5.48 M/uL   Hemoglobin 12.8 12.2 - 16.2 g/dL   HCT, POC 39.2 37.7 - 47.9 %   MCV 90.4 80 - 97 fL   MCH, POC 29.6 27 - 31.2 pg   MCHC 32.7 31.8 -  35.4 g/dL   RDW, POC 13.5 %   Platelet Count, POC 263 142 - 424 K/uL   MPV 7.3 0 - 99.8 fL   [facility] reading (PRIMARY) by  Dr. Tamala Julian.  LUMBAR SPINE FILMS: DDD MOST PRONOUNCED L4-L5; spondylolisthesis L4-5.      Assessment & Plan:   1. Laryngitis   2. Hyponatremia   3. Bilateral low back pain without sciatica   4. Lumbar back pain with radiculopathy affecting right lower extremity    1. Laryngitis:  New.  S/p Augmentin for two weeks per pulmonology; rx for Atrovent nasal spray provided.  Recommend Tessalon Perles for cough. 2.  Low back pain with radiculopathy intermittent: Recurrent; rx for Robaxin and Hydrocodone provided; home exercise program provided. If no improvement in two weeks, call for ortho referral.   Meds ordered this encounter  Medications  . HYDROcodone-acetaminophen (NORCO) 5-325 MG per tablet    Sig: Take 1 tablet by mouth every 6 (six) hours as needed.    Dispense:  30 tablet    Refill:  0  . methocarbamol (ROBAXIN) 500 MG tablet    Sig: TAKE 1 TABLET (500 MG TOTAL) BY MOUTH 4 (FOUR) TIMES DAILY.    Dispense:  40 tablet    Refill:  2  . ipratropium (ATROVENT) 0.03 % nasal spray    Sig: Place 2 sprays into the nose 2 (two) times daily.    Dispense:  30 mL    Refill:  0  . fluocinonide ointment (LIDEX) 0.05 %    Sig:     Refill:  2    No Follow-up on file.   I personally performed the services described in this documentation, which was scribed in my presence. The recorded information has been reviewed and  considered.  Ephram Kornegay Elayne Guerin, M.D. [facility] [address] [place], [place]  [phone] phone [phone] fax

## 2015-04-19 NOTE — Patient Instructions (Signed)
Low Back Sprain with Rehab  A sprain is an injury in which a ligament is torn. The ligaments of the lower back are vulnerable to sprains. However, they are strong and require great force to be injured. These ligaments are important for stabilizing the spinal column. Sprains are classified into three categories. Grade 1 sprains cause pain, but the tendon is not lengthened. Grade 2 sprains include a lengthened ligament, due to the ligament being stretched or partially ruptured. With grade 2 sprains there is still function, although the function may be decreased. Grade 3 sprains involve a complete tear of the tendon or muscle, and function is usually impaired. SYMPTOMS   Severe pain in the lower back.  Sometimes, a feeling of a "pop," "snap," or tear, at the time of injury.  Tenderness and sometimes swelling at the injury site.  Uncommonly, bruising (contusion) within 48 hours of injury.  Muscle spasms in the back. CAUSES  Low back sprains occur when a force is placed on the ligaments that is greater than they can handle. Common causes of injury include:  Performing a stressful act while off-balance.  Repetitive stressful activities that involve movement of the lower back.  Direct hit (trauma) to the lower back. RISK INCREASES WITH:  Contact sports (football, wrestling).  Collisions (major skiing accidents).  Sports that require throwing or lifting (baseball, weightlifting).  Sports involving twisting of the spine (gymnastics, diving, tennis, golf).  Poor strength and flexibility.  Inadequate protection.  Previous back injury or surgery (especially fusion). PREVENTION  Wear properly fitted and padded protective equipment.  Warm up and stretch properly before activity.  Allow for adequate recovery between workouts.  Maintain physical fitness:  Strength, flexibility, and endurance.  Cardiovascular fitness.  Maintain a healthy body weight. PROGNOSIS  If treated  properly, low back sprains usually heal with non-surgical treatment. The length of time for healing depends on the severity of the injury.  RELATED COMPLICATIONS   Recurring symptoms, resulting in a chronic problem.  Chronic inflammation and pain in the low back.  Delayed healing or resolution of symptoms, especially if activity is resumed too soon.  Prolonged impairment.  Unstable or arthritic joints of the low back. TREATMENT  Treatment first involves the use of ice and medicine, to reduce pain and inflammation. The use of strengthening and stretching exercises may help reduce pain with activity. These exercises may be performed at home or with a therapist. Severe injuries may require referral to a therapist for further evaluation and treatment, such as ultrasound. Your caregiver may advise that you wear a back brace or corset, to help reduce pain and discomfort. Often, prolonged bed rest results in greater harm then benefit. Corticosteroid injections may be recommended. However, these should be reserved for the most serious cases. It is important to avoid using your back when lifting objects. At night, sleep on your back on a firm mattress, with a pillow placed under your knees. If non-surgical treatment is unsuccessful, surgery may be needed.  MEDICATION   If pain medicine is needed, nonsteroidal anti-inflammatory medicines (aspirin and ibuprofen), or other minor pain relievers (acetaminophen), are often advised.  Do not take pain medicine for 7 days before surgery.  Prescription pain relievers may be given, if your caregiver thinks they are needed. Use only as directed and only as much as you need.  Ointments applied to the skin may be helpful.  Corticosteroid injections may be given by your caregiver. These injections should be reserved for the most serious cases,   because they may only be given a certain number of times. HEAT AND COLD  Cold treatment (icing) should be applied for 10  to 15 minutes every 2 to 3 hours for inflammation and pain, and immediately after activity that aggravates your symptoms. Use ice packs or an ice massage.  Heat treatment may be used before performing stretching and strengthening activities prescribed by your caregiver, physical therapist, or athletic trainer. Use a heat pack or a warm water soak. SEEK MEDICAL CARE IF:   Symptoms get worse or do not improve in 2 to 4 weeks, despite treatment.  You develop numbness or weakness in either leg.  You lose bowel or bladder function.  Any of the following occur after surgery: fever, increased pain, swelling, redness, drainage of fluids, or bleeding in the affected area.  New, unexplained symptoms develop. (Drugs used in treatment may produce side effects.) EXERCISES  RANGE OF MOTION (ROM) AND STRETCHING EXERCISES - Low Back Sprain Most people with lower back pain will find that their symptoms get worse with excessive bending forward (flexion) or arching at the lower back (extension). The exercises that will help resolve your symptoms will focus on the opposite motion.  Your physician, physical therapist or athletic trainer will help you determine which exercises will be most helpful to resolve your lower back pain. Do not complete any exercises without first consulting with your caregiver. Discontinue any exercises which make your symptoms worse, until you speak to your caregiver. If you have pain, numbness or tingling which travels down into your buttocks, leg or foot, the goal of the therapy is for these symptoms to move closer to your back and eventually resolve. Sometimes, these leg symptoms will get better, but your lower back pain may worsen. This is often an indication of progress in your rehabilitation. Be very alert to any changes in your symptoms and the activities in which you participated in the 24 hours prior to the change. Sharing this information with your caregiver will allow him or her to  most efficiently treat your condition. These exercises may help you when beginning to rehabilitate your injury. Your symptoms may resolve with or without further involvement from your physician, physical therapist or athletic trainer. While completing these exercises, remember:   Restoring tissue flexibility helps normal motion to return to the joints. This allows healthier, less painful movement and activity.  An effective stretch should be held for at least 30 seconds.  A stretch should never be painful. You should only feel a gentle lengthening or release in the stretched tissue. FLEXION RANGE OF MOTION AND STRETCHING EXERCISES: STRETCH - Flexion, Single Knee to Chest   Lie on a firm bed or floor with both legs extended in front of you.  Keeping one leg in contact with the floor, bring your opposite knee to your chest. Hold your leg in place by either grabbing behind your thigh or at your knee.  Pull until you feel a gentle stretch in your low back. Hold __________ seconds.  Slowly release your grasp and repeat the exercise with the opposite side. Repeat __________ times. Complete this exercise __________ times per day.  STRETCH - Flexion, Double Knee to Chest  Lie on a firm bed or floor with both legs extended in front of you.  Keeping one leg in contact with the floor, bring your opposite knee to your chest.  Tense your stomach muscles to support your back and then lift your other knee to your chest. Hold your legs   in place by either grabbing behind your thighs or at your knees.  Pull both knees toward your chest until you feel a gentle stretch in your low back. Hold __________ seconds.  Tense your stomach muscles and slowly return one leg at a time to the floor. Repeat __________ times. Complete this exercise __________ times per day.  STRETCH - Low Trunk Rotation  Lie on a firm bed or floor. Keeping your legs in front of you, bend your knees so they are both pointed toward the  ceiling and your feet are flat on the floor.  Extend your arms out to the side. This will stabilize your upper body by keeping your shoulders in contact with the floor.  Gently and slowly drop both knees together to one side until you feel a gentle stretch in your low back. Hold for __________ seconds.  Tense your stomach muscles to support your lower back as you bring your knees back to the starting position. Repeat the exercise to the other side. Repeat __________ times. Complete this exercise __________ times per day  EXTENSION RANGE OF MOTION AND FLEXIBILITY EXERCISES: STRETCH - Extension, Prone on Elbows   Lie on your stomach on the floor, a bed will be too soft. Place your palms about shoulder width apart and at the height of your head.  Place your elbows under your shoulders. If this is too painful, stack pillows under your chest.  Allow your body to relax so that your hips drop lower and make contact more completely with the floor.  Hold this position for __________ seconds.  Slowly return to lying flat on the floor. Repeat __________ times. Complete this exercise __________ times per day.  RANGE OF MOTION - Extension, Prone Press Ups  Lie on your stomach on the floor, a bed will be too soft. Place your palms about shoulder width apart and at the height of your head.  Keeping your back as relaxed as possible, slowly straighten your elbows while keeping your hips on the floor. You may adjust the placement of your hands to maximize your comfort. As you gain motion, your hands will come more underneath your shoulders.  Hold this position __________ seconds.  Slowly return to lying flat on the floor. Repeat __________ times. Complete this exercise __________ times per day.  RANGE OF MOTION- Quadruped, Neutral Spine   Assume a hands and knees position on a firm surface. Keep your hands under your shoulders and your knees under your hips. You may place padding under your knees for  comfort.  Drop your head and point your tailbone toward the ground below you. This will round out your lower back like an angry cat. Hold this position for __________ seconds.  Slowly lift your head and release your tail bone so that your back sags into a large arch, like an old horse.  Hold this position for __________ seconds.  Repeat this until you feel limber in your low back.  Now, find your "sweet spot." This will be the most comfortable position somewhere between the two previous positions. This is your neutral spine. Once you have found this position, tense your stomach muscles to support your low back.  Hold this position for __________ seconds. Repeat __________ times. Complete this exercise __________ times per day.  STRENGTHENING EXERCISES - Low Back Sprain These exercises may help you when beginning to rehabilitate your injury. These exercises should be done near your "sweet spot." This is the neutral, low-back arch, somewhere between fully rounded   and fully arched, that is your least painful position. When performed in this safe range of motion, these exercises can be used for people who have either a flexion or extension based injury. These exercises may resolve your symptoms with or without further involvement from your physician, physical therapist or athletic trainer. While completing these exercises, remember:   Muscles can gain both the endurance and the strength needed for everyday activities through controlled exercises.  Complete these exercises as instructed by your physician, physical therapist or athletic trainer. Increase the resistance and repetitions only as guided.  You may experience muscle soreness or fatigue, but the pain or discomfort you are trying to eliminate should never worsen during these exercises. If this pain does worsen, stop and make certain you are following the directions exactly. If the pain is still present after adjustments, discontinue the  exercise until you can discuss the trouble with your caregiver. STRENGTHENING - Deep Abdominals, Pelvic Tilt   Lie on a firm bed or floor. Keeping your legs in front of you, bend your knees so they are both pointed toward the ceiling and your feet are flat on the floor.  Tense your lower abdominal muscles to press your low back into the floor. This motion will rotate your pelvis so that your tail bone is scooping upwards rather than pointing at your feet or into the floor. With a gentle tension and even breathing, hold this position for __________ seconds. Repeat __________ times. Complete this exercise __________ times per day.  STRENGTHENING - Abdominals, Crunches   Lie on a firm bed or floor. Keeping your legs in front of you, bend your knees so they are both pointed toward the ceiling and your feet are flat on the floor. Cross your arms over your chest.  Slightly tip your chin down without bending your neck.  Tense your abdominals and slowly lift your trunk high enough to just clear your shoulder blades. Lifting higher can put excessive stress on the lower back and does not further strengthen your abdominal muscles.  Control your return to the starting position. Repeat __________ times. Complete this exercise __________ times per day.  STRENGTHENING - Quadruped, Opposite UE/LE Lift   Assume a hands and knees position on a firm surface. Keep your hands under your shoulders and your knees under your hips. You may place padding under your knees for comfort.  Find your neutral spine and gently tense your abdominal muscles so that you can maintain this position. Your shoulders and hips should form a rectangle that is parallel with the floor and is not twisted.  Keeping your trunk steady, lift your right hand no higher than your shoulder and then your left leg no higher than your hip. Make sure you are not holding your breath. Hold this position for __________ seconds.  Continuing to keep  your abdominal muscles tense and your back steady, slowly return to your starting position. Repeat with the opposite arm and leg. Repeat __________ times. Complete this exercise __________ times per day.  STRENGTHENING - Abdominals and Quadriceps, Straight Leg Raise   Lie on a firm bed or floor with both legs extended in front of you.  Keeping one leg in contact with the floor, bend the other knee so that your foot can rest flat on the floor.  Find your neutral spine, and tense your abdominal muscles to maintain your spinal position throughout the exercise.  Slowly lift your straight leg off the floor about 6 inches for a count   of 15, making sure to not hold your breath.  Still keeping your neutral spine, slowly lower your leg all the way to the floor. Repeat this exercise with each leg __________ times. Complete this exercise __________ times per day. POSTURE AND BODY MECHANICS CONSIDERATIONS - Low Back Sprain Keeping correct posture when sitting, standing or completing your activities will reduce the stress put on different body tissues, allowing injured tissues a chance to heal and limiting painful experiences. The following are general guidelines for improved posture. Your physician or physical therapist will provide you with any instructions specific to your needs. While reading these guidelines, remember:  The exercises prescribed by your provider will help you have the flexibility and strength to maintain correct postures.  The correct posture provides the best environment for your joints to work. All of your joints have less wear and tear when properly supported by a spine with good posture. This means you will experience a healthier, less painful body.  Correct posture must be practiced with all of your activities, especially prolonged sitting and standing. Correct posture is as important when doing repetitive low-stress activities (typing) as it is when doing a single heavy-load  activity (lifting). RESTING POSITIONS Consider which positions are most painful for you when choosing a resting position. If you have pain with flexion-based activities (sitting, bending, stooping, squatting), choose a position that allows you to rest in a less flexed posture. You would want to avoid curling into a fetal position on your side. If your pain worsens with extension-based activities (prolonged standing, working overhead), avoid resting in an extended position such as sleeping on your stomach. Most people will find more comfort when they rest with their spine in a more neutral position, neither too rounded nor too arched. Lying on a non-sagging bed on your side with a pillow between your knees, or on your back with a pillow under your knees will often provide some relief. Keep in mind, being in any one position for a prolonged period of time, no matter how correct your posture, can still lead to stiffness. PROPER SITTING POSTURE In order to minimize stress and discomfort on your spine, you must sit with correct posture. Sitting with good posture should be effortless for a healthy body. Returning to good posture is a gradual process. Many people can work toward this most comfortably by using various supports until they have the flexibility and strength to maintain this posture on their own. When sitting with proper posture, your ears will fall over your shoulders and your shoulders will fall over your hips. You should use the back of the chair to support your upper back. Your lower back will be in a neutral position, just slightly arched. You may place a small pillow or folded towel at the base of your lower back for  support.  When working at a desk, create an environment that supports good, upright posture. Without extra support, muscles tire, which leads to excessive strain on joints and other tissues. Keep these recommendations in mind: CHAIR:  A chair should be able to slide under your desk  when your back makes contact with the back of the chair. This allows you to work closely.  The chair's height should allow your eyes to be level with the upper part of your monitor and your hands to be slightly lower than your elbows. BODY POSITION  Your feet should make contact with the floor. If this is not possible, use a foot rest.  Keep your   ears over your shoulders. This will reduce stress on your neck and low back. INCORRECT SITTING POSTURES  If you are feeling tired and unable to assume a healthy sitting posture, do not slouch or slump. This puts excessive strain on your back tissues, causing more damage and pain. Healthier options include:  Using more support, like a lumbar pillow.  Switching tasks to something that requires you to be upright or walking.  Talking a brief walk.  Lying down to rest in a neutral-spine position. PROLONGED STANDING WHILE SLIGHTLY LEANING FORWARD  When completing a task that requires you to lean forward while standing in one place for a long time, place either foot up on a stationary 2-4 inch high object to help maintain the best posture. When both feet are on the ground, the lower back tends to lose its slight inward curve. If this curve flattens (or becomes too large), then the back and your other joints will experience too much stress, tire more quickly, and can cause pain. CORRECT STANDING POSTURES Proper standing posture should be assumed with all daily activities, even if they only take a few moments, like when brushing your teeth. As in sitting, your ears should fall over your shoulders and your shoulders should fall over your hips. You should keep a slight tension in your abdominal muscles to brace your spine. Your tailbone should point down to the ground, not behind your body, resulting in an over-extended swayback posture.  INCORRECT STANDING POSTURES  Common incorrect standing postures include a forward head, locked knees and/or an excessive  swayback. WALKING Walk with an upright posture. Your ears, shoulders and hips should all line-up. PROLONGED ACTIVITY IN A FLEXED POSITION When completing a task that requires you to bend forward at your waist or lean over a low surface, try to find a way to stabilize 3 out of 4 of your limbs. You can place a hand or elbow on your thigh or rest a knee on the surface you are reaching across. This will provide you more stability, so that your muscles do not tire as quickly. By keeping your knees relaxed, or slightly bent, you will also reduce stress across your lower back. CORRECT LIFTING TECHNIQUES DO :  Assume a wide stance. This will provide you more stability and the opportunity to get as close as possible to the object which you are lifting.  Tense your abdominals to brace your spine. Bend at the knees and hips. Keeping your back locked in a neutral-spine position, lift using your leg muscles. Lift with your legs, keeping your back straight.  Test the weight of unknown objects before attempting to lift them.  Try to keep your elbows locked down at your sides in order get the best strength from your shoulders when carrying an object.  Always ask for help when lifting heavy or awkward objects. INCORRECT LIFTING TECHNIQUES DO NOT:   Lock your knees when lifting, even if it is a small object.  Bend and twist. Pivot at your feet or move your feet when needing to change directions.  Assume that you can safely pick up even a paperclip without proper posture. Document Released: 11/17/2005 Document Revised: 02/09/2012 Document Reviewed: 03/01/2009 [employer] Patient Information 2015 [employer]. This information is not intended to replace advice given to you by your health care provider. Make sure you discuss any questions you have with your health care provider.  

## 2015-04-20 LAB — COMPREHENSIVE METABOLIC PANEL
ALT: 24 U/L (ref 0–35)
AST: 26 U/L (ref 0–37)
Albumin: 3.7 g/dL (ref 3.5–5.2)
Alkaline Phosphatase: 134 U/L — ABNORMAL HIGH (ref 39–117)
BILIRUBIN TOTAL: 0.4 mg/dL (ref 0.2–1.2)
BUN: 7 mg/dL (ref 6–23)
CALCIUM: 9.2 mg/dL (ref 8.4–10.5)
CO2: 29 mEq/L (ref 19–32)
CREATININE: 0.57 mg/dL (ref 0.50–1.10)
Chloride: 89 mEq/L — ABNORMAL LOW (ref 96–112)
Glucose, Bld: 96 mg/dL (ref 70–99)
Potassium: 3.6 mEq/L (ref 3.5–5.3)
Sodium: 130 mEq/L — ABNORMAL LOW (ref 135–145)
Total Protein: 7.9 g/dL (ref 6.0–8.3)

## 2015-04-20 NOTE — Telephone Encounter (Signed)
LMTCb x 2 

## 2015-04-23 NOTE — Telephone Encounter (Signed)
Called and spoke to pt. Informed pt of the recs per MR. Pt verbalized understanding and denied any further questions or concerns at this time.  

## 2015-04-25 ENCOUNTER — Other Ambulatory Visit: Payer: Self-pay | Admitting: Cardiology

## 2015-04-25 ENCOUNTER — Encounter: Payer: Self-pay | Admitting: Cardiology

## 2015-04-25 ENCOUNTER — Ambulatory Visit (INDEPENDENT_AMBULATORY_CARE_PROVIDER_SITE_OTHER): Payer: Managed Care, Other (non HMO) | Admitting: Cardiology

## 2015-04-25 VITALS — BP 110/70 | HR 99 | Ht 63.0 in | Wt 142.0 lb

## 2015-04-25 DIAGNOSIS — F32A Depression, unspecified: Secondary | ICD-10-CM

## 2015-04-25 DIAGNOSIS — J470 Bronchiectasis with acute lower respiratory infection: Secondary | ICD-10-CM | POA: Diagnosis not present

## 2015-04-25 DIAGNOSIS — F329 Major depressive disorder, single episode, unspecified: Secondary | ICD-10-CM | POA: Diagnosis not present

## 2015-04-25 DIAGNOSIS — I119 Hypertensive heart disease without heart failure: Secondary | ICD-10-CM | POA: Diagnosis not present

## 2015-04-25 NOTE — Progress Notes (Signed)
Cardiology Office Note   Date:  04/25/2015   ID:  Catherine Kelley, DOB 1951-06-29, MRN [id number]  PCP:  [name], MD  Cardiologist: [name] MD  No chief complaint on file.     History of Present Illness: Catherine Kelley is a 64 y.o. female who presents for a four-month follow-up office visit  This pleasant 64 year old woman is seen for a scheduled followup office visit. She has a history of essential hypertension. She also has a history of frequent episodes of bronchitis and continues to smoke against advice.She has now been diagnosed with cavitary pneumonia and bronchiectasis.  She is followed closely by pulmonary.  He has a chronic cough.    She states that in August 2015 she was hospitalized at [facility] with severe necrotizing pneumonia and sepsis. Following acute care treatment she had to go to a rehabilitation facility for 3 weeks before returning home. 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 Unfortunately despite the severe pulmonary infection that she had, she resumed smoking after returning home. She reminds me that her husband is a chain smoker. Cardiac standpoint she's been stable with no chest pain or increasing shortness of breath or palpitations. She has a past history of depression.  Interestingly, she states that she smokes less when she is depressed than when she is not depressed. She has had some intermittent nausea. She has had some need for dental work but does not want to proceed with dental implants as recommended by her current dentist.  She would like to get a second opinion.  Past Medical History  Diagnosis Date  . Hypertension     essential hypertension  . Tobacco abuse     smoking about 1/2 pk of cigarettes a day  . Anxiety   . Depression     psychiatrist Dr. Toy Care  . Respiratory infection   . Shortness of breath   . Malaise   . Lightheadedness   . Emphysematous COPD    changes in right base along with patchy areas on last x-ray  . COPD (chronic obstructive pulmonary disease)   . History of echocardiogram 12/24/2006    Est. EF of 61-60% NORMAL LV SYSTOLIC FUNCTION WITH IMPAIRED RELAXATION -- MILD AORTIC SCLEROSIS -- NORMAL PALONARY ARTERY PRESSURE -- NO OLD ECHOS FOR COMPARISON -- [name], MD  . History of cardiovascular stress test 08/15/2004    EF of 70% -- Normal stress cardiolite.  There is no evidence of ischemia and there is normal LV function. -- Marcello Moores A. Alese Furniss. MD  . Diastolic dysfunction   . Allergy   . Asthma   . Heart murmur   . Pneumonia     "several times since May 2015" (07/06/2014)  . Chronic bronchitis     "get it alot; maybe not q yr" (07/06/2014)    Past Surgical History  Procedure Laterality Date  . Tubal ligation  1986     Current Outpatient Prescriptions  Medication Sig Dispense Refill  . albuterol (PROVENTIL HFA;VENTOLIN HFA) 108 (90 BASE) MCG/ACT inhaler Inhale 2 puffs into the lungs every 6 (six) hours as needed for wheezing or shortness of breath. 1 Inhaler 5  . albuterol (PROVENTIL) (2.5 MG/3ML) 0.083% nebulizer solution Take 3 mLs (2.5 mg total) by nebulization every 3 (three) hours as needed for wheezing or shortness of breath. 75 mL 12  . ALPRAZolam (XANAX) 0.5 MG tablet Take 0.5 mg by mouth  2 (two) times daily.    . benzonatate (TESSALON) 100 MG capsule Take 1-2 capsules (100-200 mg total) by mouth 3 (three) times daily as needed for cough. 60 capsule 0  . Calcium Carbonate-Vitamin D (CALCIUM-VITAMIN D) 500-200 MG-UNIT per tablet Take 1 tablet by mouth daily.    . divalproex (DEPAKOTE ER) 500 MG 24 hr tablet Take 500 mg by mouth at bedtime.     . fluocinonide ointment (LIDEX) 0.05 %   2  . furosemide (LASIX) 20 MG tablet TAKE 2 TABLETS (40 MG TOTAL) BY MOUTH DAILY. 180 tablet 1  . guaiFENesin (MUCINEX) 600 MG 12 hr tablet Take 600 mg by mouth 2 (two) times daily as needed.    [name] HYDROcodone-acetaminophen (NORCO)  5-325 MG per tablet Take 1 tablet by mouth every 6 (six) hours as needed. 30 tablet 0  . HYDROcodone-homatropine (HYDROMET) 5-1.5 MG/5ML syrup Take 5 mLs by mouth every 6 (six) hours as needed for cough. 240 mL 0  . ipratropium (ATROVENT) 0.03 % nasal spray Place 2 sprays into the nose 2 (two) times daily. 30 mL 0  . losartan-hydrochlorothiazide (HYZAAR) 50-12.5 MG per tablet 1/2 tab by mouth once daily    . methocarbamol (ROBAXIN) 500 MG tablet TAKE 1 TABLET (500 MG TOTAL) BY MOUTH 4 (FOUR) TIMES DAILY. 40 tablet 2  . Multiple Vitamin (MULTIVITAMIN) tablet Take 1 tablet by mouth daily.    . nortriptyline (PAMELOR) 50 MG capsule Take 100 mg by mouth at bedtime.    . predniSONE (DELTASONE) 20 MG tablet Two tablets daily x 5 days then one tablet daily x 5 days 15 tablet 0  . senna (SENOKOT) 8.6 MG TABS tablet Take 2 tablets (17.2 mg total) by mouth at bedtime. 120 each 0  . tiotropium (SPIRIVA) 18 MCG inhalation capsule Place 1 capsule (18 mcg total) into inhaler and inhale daily. 30 capsule 6  . nadolol (CORGARD) 40 MG tablet TAKE 1/2 TABLET BY MOUTH EVERY DAY 45 tablet 3   No current facility-administered medications for this visit.    Allergies:   Augmentin; Ceclor; Escitalopram oxalate; Penicillins; Sertraline hcl; and Sulfa drugs cross reactors    Social History:  The patient  reports that she has been smoking Cigarettes.  She has a 42 pack-year smoking history. She has never used smokeless tobacco. She reports that she does not drink alcohol or use illicit drugs.   Family History:  The patient's family history includes Cancer in her maternal aunt; Heart disease in her father and mother; Hyperlipidemia in her father; Hypertension in her father; Stroke in her mother.    ROS:  Please see the history of present illness.   Otherwise, review of systems are positive for none.   All other systems are reviewed and negative.    PHYSICAL EXAM: VS:  BP 110/70 mmHg  Pulse 99  Ht '5\' 3"'$  (1.6 m)   Wt 142 lb (64.411 kg)  BMI 25.16 kg/m2  SpO2 98% , BMI Body mass index is 25.16 kg/(m^2). GEN: Well nourished, well developed, in no acute distress HEENT: normal Neck: no JVD, carotid bruits, or masses Cardiac: RRR; no murmurs, rubs, or gallops,no edema  Respiratory:  clear to auscultation bilaterally, normal work of breathing.  The breath sounds at the right base are heard decreased as compared with the left GI: soft, nontender, nondistended, + BS MS: no deformity or atrophy Skin: warm and dry, no rash Neuro:  Strength and sensation are intact Psych: euthymic mood, full affect   EKG:  EKG is not ordered today.    Recent Labs: 07/06/2014: TSH 0.482 07/09/2014: Magnesium 1.3* 07/10/2014: Pro B Natriuretic peptide (BNP) 568.1* 07/12/2014: Platelets 234 04/19/2015: ALT 24; BUN 7; Creatinine 0.57; Hemoglobin 12.8; Potassium 3.6; Sodium 130*    Lipid Panel No results found for: CHOL, TRIG, HDL, CHOLHDL, VLDL, LDLCALC, LDLDIRECT    Wt Readings from Last 3 Encounters:  04/25/15 142 lb (64.411 kg)  04/19/15 145 lb 4 oz (65.885 kg)  03/14/15 139 lb (63.05 kg)        ASSESSMENT AND PLAN:  1. Hypertensive heart disease without heart failure 2. Depression 3. tobacco abuse , ongoing 4. Frequent bronchitis.  Past history of necrotizing pneumonitis.  History of bronchiectasis and cavitary pneumonia.   Current medicines are reviewed at length with the patient today. The patient does not have concerns regarding medicines.  The following changes have been made: no change  Labs/ tests ordered today include: None  No orders of the defined types were placed in this encounter.    Disposition: FU with Dr. Mare Ferrari in 4 months for office visit. Continue current medication. I again strongly urged her to stop smoking altogether. She still has rhonchi at the right base   Current medicines are reviewed at length with the patient today.  The patient does not have concerns regarding  medicines.  The following changes have been made:  no change  Labs/ tests ordered today include:  No orders of the defined types were placed in this encounter.     Disposition: Continue current medication.  Again emphasizing importance of not smoking.  She is now using nicotine gum Recheck in 4 months for office visit and EKG,  Signed, [name] MD 04/25/2015 12:42 PM    [address], [place], [place]  [address] Phone: [phone]; Fax: [phone]

## 2015-04-25 NOTE — Patient Instructions (Signed)
Medication Instructions:  Your physician recommends that you continue on your current medications as directed. Please refer to the Current Medication list given to you today.  Labwork: none  Testing/Procedures: none  Follow-Up: Your physician wants you to follow-up in: 4 month ov/ekg  You will receive a reminder letter in the mail two months in advance. If you don't receive a letter, please call our office to schedule the follow-up appointment.   Any Other Special Instructions Will Be Listed Below (If Applicable).

## 2015-04-29 ENCOUNTER — Ambulatory Visit (INDEPENDENT_AMBULATORY_CARE_PROVIDER_SITE_OTHER): Payer: Managed Care, Other (non HMO)

## 2015-04-29 ENCOUNTER — Ambulatory Visit (INDEPENDENT_AMBULATORY_CARE_PROVIDER_SITE_OTHER): Payer: Managed Care, Other (non HMO) | Admitting: Family Medicine

## 2015-04-29 VITALS — BP 130/80 | HR 105 | Temp 98.2°F | Resp 16 | Ht 62.0 in | Wt 143.0 lb

## 2015-04-29 DIAGNOSIS — R059 Cough, unspecified: Secondary | ICD-10-CM

## 2015-04-29 DIAGNOSIS — R05 Cough: Secondary | ICD-10-CM | POA: Diagnosis not present

## 2015-04-29 DIAGNOSIS — E871 Hypo-osmolality and hyponatremia: Secondary | ICD-10-CM

## 2015-04-29 DIAGNOSIS — M545 Low back pain, unspecified: Secondary | ICD-10-CM

## 2015-04-29 DIAGNOSIS — R42 Dizziness and giddiness: Secondary | ICD-10-CM | POA: Diagnosis not present

## 2015-04-29 DIAGNOSIS — J22 Unspecified acute lower respiratory infection: Secondary | ICD-10-CM

## 2015-04-29 DIAGNOSIS — J040 Acute laryngitis: Secondary | ICD-10-CM | POA: Diagnosis not present

## 2015-04-29 DIAGNOSIS — J470 Bronchiectasis with acute lower respiratory infection: Secondary | ICD-10-CM | POA: Diagnosis not present

## 2015-04-29 DIAGNOSIS — N3946 Mixed incontinence: Secondary | ICD-10-CM

## 2015-04-29 DIAGNOSIS — W19XXXA Unspecified fall, initial encounter: Secondary | ICD-10-CM

## 2015-04-29 DIAGNOSIS — J988 Other specified respiratory disorders: Secondary | ICD-10-CM

## 2015-04-29 DIAGNOSIS — M25551 Pain in right hip: Secondary | ICD-10-CM

## 2015-04-29 LAB — POCT CBC
Granulocyte percent: 72.9 %G (ref 37–80)
HCT, POC: 43.7 % (ref 37.7–47.9)
Hemoglobin: 14.3 g/dL (ref 12.2–16.2)
Lymph, poc: 3.2 (ref 0.6–3.4)
MCH, POC: 29.9 pg (ref 27–31.2)
MCHC: 32.7 g/dL (ref 31.8–35.4)
MCV: 91.5 fL (ref 80–97)
MID (CBC): 0.6 (ref 0–0.9)
MPV: 7 fL (ref 0–99.8)
POC Granulocyte: 10.1 — AB (ref 2–6.9)
POC LYMPH %: 22.7 % (ref 10–50)
POC MID %: 4.4 %M (ref 0–12)
Platelet Count, POC: 375 10*3/uL (ref 142–424)
RBC: 4.77 M/uL (ref 4.04–5.48)
RDW, POC: 15.2 %
WBC: 13.9 10*3/uL — AB (ref 4.6–10.2)

## 2015-04-29 LAB — POCT URINALYSIS DIPSTICK
Bilirubin, UA: NEGATIVE
Blood, UA: NEGATIVE
Glucose, UA: NEGATIVE
Ketones, UA: NEGATIVE
NITRITE UA: POSITIVE
PH UA: 7
Spec Grav, UA: 1.01
Urobilinogen, UA: 0.2

## 2015-04-29 LAB — COMPREHENSIVE METABOLIC PANEL
ALK PHOS: 124 U/L — AB (ref 39–117)
ALT: 18 U/L (ref 0–35)
AST: 25 U/L (ref 0–37)
Albumin: 3.5 g/dL (ref 3.5–5.2)
BUN: 7 mg/dL (ref 6–23)
CHLORIDE: 88 meq/L — AB (ref 96–112)
CO2: 31 mEq/L (ref 19–32)
Calcium: 9 mg/dL (ref 8.4–10.5)
Creat: 0.54 mg/dL (ref 0.50–1.10)
Glucose, Bld: 107 mg/dL — ABNORMAL HIGH (ref 70–99)
Potassium: 3.6 mEq/L (ref 3.5–5.3)
Sodium: 130 mEq/L — ABNORMAL LOW (ref 135–145)
TOTAL PROTEIN: 7.3 g/dL (ref 6.0–8.3)
Total Bilirubin: 0.3 mg/dL (ref 0.2–1.2)

## 2015-04-29 MED ORDER — AMOXICILLIN-POT CLAVULANATE 875-125 MG PO TABS: 1.0000 | ORAL_TABLET | Freq: Two times a day (BID) | ORAL | Status: DC

## 2015-04-29 NOTE — Progress Notes (Signed)
Subjective:    Patient ID: Catherine Kelley, female    DOB: [birth year], 64 y.o.   MRN: [id number]  04/29/2015  Fall and Cough   HPI This 64 y.o. female presents for evaluation of fall.  Has been dizzy for 2-3 days; not sure if got dizzy before falling.  Fell this morning at 3:00am.  No syncope.  Happened so fast.  [place] on R side; landed on bottom.  Crawled to kitchen table and pulled self up.  Took hydrocodone last night after work due to pain; also took muscle relaxer.  Has abrasion along L elbow; also has abrasion along R upper arm. No shoulder pain, neck pain, wrist pain, or hand pain.  No chest pain, palpitations, SOB.  No worsening leg swelling. No seizure activity.  Has been manic lately for past week; has been staying up all night for several days.  Usually then will suffer with depression.  Staying up all night for several nights.   Psychiatrist is aware of manic state; when psychiatrist changes medications, suffers with headaches.  Husband has been telling patient to quit talking so fast.  Going to the beach this upcoming week.  Wil stay four nights.    Laryngitis, cough: persistent.  Feels hot today but no fever.  +chills/sweats.  No headache. No ear pain. Mild sore throat. Mild rhinorrhea;+nasal congestion; +hoarseness is worse.  +congestion is in throat and not in chest. No SOB.  No sputum production.  Tessalon Perles have helped with cough.  Atrovent nasal spray has also helped.  Chronic urinary incontinence stress and urge. Maintained on Lasix therapy which worsens incontinence issues; soaking wet currently; needs to buy more pads.   Review of Systems  Constitutional: Positive for chills and fatigue. Negative for fever and diaphoresis.  HENT: Positive for congestion, postnasal drip, rhinorrhea, sore throat and voice change. Negative for ear pain and trouble swallowing.   Eyes: Negative for visual disturbance.  Respiratory: Positive for cough. Negative for shortness of breath and  wheezing.   Cardiovascular: Negative for chest pain, palpitations and leg swelling.  Gastrointestinal: Negative for nausea, vomiting, abdominal pain, diarrhea and constipation.  Endocrine: Negative for cold intolerance, heat intolerance, polydipsia, polyphagia and polyuria.  Musculoskeletal: Positive for myalgias, arthralgias and gait problem. Negative for joint swelling, neck pain and neck stiffness.  Skin: Positive for wound.  Neurological: Positive for dizziness. Negative for tremors, seizures, syncope, facial asymmetry, speech difficulty, weakness, light-headedness, numbness and headaches.  Psychiatric/Behavioral: Positive for sleep disturbance. Negative for suicidal ideas, self-injury and dysphoric mood. The patient is nervous/anxious and is hyperactive.     Past Medical History  Diagnosis Date  . Hypertension     essential hypertension  . Tobacco abuse     smoking about 1/2 pk of cigarettes a day  . Anxiety   . Depression     psychiatrist Dr. Toy Care  . Respiratory infection   . Shortness of breath   . Malaise   . Lightheadedness   . Emphysematous COPD     changes in right base along with patchy areas on last x-ray  . COPD (chronic obstructive pulmonary disease)   . History of echocardiogram 12/24/2006    Est. EF of 28-31% NORMAL LV SYSTOLIC FUNCTION WITH IMPAIRED RELAXATION -- MILD AORTIC SCLEROSIS -- NORMAL PALONARY ARTERY PRESSURE -- NO OLD ECHOS FOR COMPARISON -- [name], MD  . History of cardiovascular stress test 08/15/2004    EF of 70% -- Normal stress cardiolite.  There is no evidence of  ischemia and there is normal LV function. -- [name]. MD  . Diastolic dysfunction   . Allergy   . Asthma   . Heart murmur   . Pneumonia     "several times since May 2015" (07/06/2014)  . Chronic bronchitis     "get it alot; maybe not q yr" (07/06/2014)   Past Surgical History  Procedure Laterality Date  . Tubal ligation  1986   Allergies  Allergen Reactions  .  Augmentin [Amoxicillin-Pot Clavulanate]     UNKNOWN  . Ceclor [Cefaclor]     hives  . Escitalopram Oxalate     Pt does not recall ever taking medication  . Penicillins     Rash------pt stated she CAN take Penicillin  . Sertraline Hcl     UNKNOWN  . Sulfa Drugs Cross Reactors     Hives and rash    History   Social History  . Marital Status: Married    Spouse Name: N/A  . Number of Children: N/A  . Years of Education: N/A   Occupational History  . SALES ASSOCIATE     [employer]   Social History Main Topics  . Smoking status: Current Every Day Smoker -- 1.00 packs/day for 42 years    Types: Cigarettes  . Smokeless tobacco: Never Used  . Alcohol Use: No  . Drug Use: No  . Sexual Activity: No   Other Topics Concern  . Not on file   Social History Narrative        Objective:    BP 130/80 mmHg  Pulse 105  Temp(Src) 98.2 F (36.8 C) (Oral)  Resp 16  Ht '5\' 2"'$  (1.575 m)  Wt 143 lb (64.864 kg)  BMI 26.15 kg/m2  SpO2 96% Physical Exam  Constitutional: She is oriented to person, place, and time. She appears well-developed and well-nourished. No distress.  HENT:  Head: Normocephalic and atraumatic.  Right Ear: Tympanic membrane, external ear and ear canal normal.  Left Ear: Tympanic membrane, external ear and ear canal normal.  Nose: Nose normal. No mucosal edema or rhinorrhea. Right sinus exhibits no maxillary sinus tenderness and no frontal sinus tenderness. Left sinus exhibits no maxillary sinus tenderness and no frontal sinus tenderness.  Mouth/Throat: Mucous membranes are normal. Oropharyngeal exudate present. No posterior oropharyngeal edema, posterior oropharyngeal erythema or tonsillar abscesses.  Eyes: Conjunctivae and EOM are normal. Pupils are equal, round, and reactive to light.  Neck: Normal range of motion. Neck supple. Carotid bruit is not present. No thyromegaly present.  Cardiovascular: Normal rate, regular rhythm, normal heart sounds and intact distal  pulses.  Exam reveals no gallop and no friction rub.   No murmur heard. Pulmonary/Chest: Effort normal and breath sounds normal. She has no wheezes. She has no rales.  Abdominal: Soft. Bowel sounds are normal. She exhibits no distension and no mass. There is no tenderness. There is no rebound and no guarding.  Musculoskeletal:       Right shoulder: Normal. She exhibits normal range of motion, no tenderness, no bony tenderness, no pain and normal strength.       Left shoulder: Normal. She exhibits normal range of motion, no tenderness, no bony tenderness, no pain, no spasm and normal strength.       Right hip: She exhibits tenderness. She exhibits normal range of motion and normal strength.       Cervical back: Normal. She exhibits normal range of motion, no tenderness, no bony tenderness, no pain, no spasm and normal  pulse.       Thoracic back: Normal. She exhibits normal range of motion, no tenderness, no bony tenderness and no pain.       Lumbar back: She exhibits normal range of motion, no tenderness, no bony tenderness, no pain and no spasm.  Lymphadenopathy:    She has no cervical adenopathy.  Neurological: She is alert and oriented to person, place, and time. No cranial nerve deficit.  Skin: Skin is warm and dry. No rash noted. She is not diaphoretic. No erythema. No pallor.  Psychiatric: She has a normal mood and affect. Her behavior is normal.    [facility] reading (PRIMARY) by  Dr. Tamala Julian.  CXR: stable infiltrate;  LUMBAR SPINE: no acute changes; diffuse DDD.   R HIP: NAD      Assessment & Plan:   1. Cough   2. Right hip pain   3. Right-sided low back pain without sciatica   4. Fall, initial encounter   5. Dizziness   6. Laryngitis   7. Hyponatremia   8. Mixed incontinence   9. Lower respiratory infection   10. Bronchiectasis with acute lower respiratory infection    1. Dizziness: New.  Obtain labs; normal neurological exam.   2.  R hip pain/contusion: New.  After suffering a  fall; supportive care with rest, icing, hydrocodone sparingly. 3.  R sided lower back pain: recurrent after recent fall; stable xrays; recommend supportive care with rest, fluids, stretching, Robaxin. 4. Fall: New.  Unclear etiology to fall; obtain labs; normal neurological exam.   5.  Hyponatremia: stable; repeat Na today due to dizziness and recent fall. 6.  Mixed incontinence: chronic with recent worsening; send urine culture. 7.  Bronchiectasis with lower respiratory infection: Recurrent; rx for Augmentin provided; continue Atrovent nasal spray and Tessalon perles.  Stable CXR.   Meds ordered this encounter  Medications  . amoxicillin-clavulanate (AUGMENTIN) 875-125 MG per tablet    Sig: Take 1 tablet by mouth 2 (two) times daily.    Dispense:  20 tablet    Refill:  0    No Follow-up on file.     Vedansh Kerstetter Elayne Guerin, M.D. [facility] [address] [place],   [address] [phone] phone [phone] fax

## 2015-05-04 LAB — URINE CULTURE: Colony Count: >=100000

## 2015-05-09 ENCOUNTER — Other Ambulatory Visit: Payer: Self-pay | Admitting: Family Medicine

## 2015-05-10 ENCOUNTER — Encounter: Payer: Self-pay | Admitting: *Deleted

## 2015-05-11 NOTE — Telephone Encounter (Signed)
Dr Tamala Julian, do you want to RF or need pt to RTC for re-check?

## 2015-05-25 ENCOUNTER — Other Ambulatory Visit: Payer: Self-pay | Admitting: Cardiology

## 2015-06-06 ENCOUNTER — Ambulatory Visit (INDEPENDENT_AMBULATORY_CARE_PROVIDER_SITE_OTHER): Payer: Managed Care, Other (non HMO) | Admitting: Internal Medicine

## 2015-06-06 ENCOUNTER — Encounter: Payer: Self-pay | Admitting: Internal Medicine

## 2015-06-06 VITALS — BP 122/78 | HR 80 | Ht 62.0 in | Wt 139.0 lb

## 2015-06-06 DIAGNOSIS — J449 Chronic obstructive pulmonary disease, unspecified: Secondary | ICD-10-CM

## 2015-06-06 DIAGNOSIS — J189 Pneumonia, unspecified organism: Secondary | ICD-10-CM

## 2015-06-06 DIAGNOSIS — J479 Bronchiectasis, uncomplicated: Secondary | ICD-10-CM

## 2015-06-06 DIAGNOSIS — R05 Cough: Secondary | ICD-10-CM

## 2015-06-06 DIAGNOSIS — Z720 Tobacco use: Secondary | ICD-10-CM

## 2015-06-06 DIAGNOSIS — IMO0001 Reserved for inherently not codable concepts without codable children: Secondary | ICD-10-CM

## 2015-06-06 DIAGNOSIS — F17200 Nicotine dependence, unspecified, uncomplicated: Secondary | ICD-10-CM

## 2015-06-06 DIAGNOSIS — R053 Chronic cough: Secondary | ICD-10-CM

## 2015-06-06 DIAGNOSIS — J984 Other disorders of lung: Secondary | ICD-10-CM

## 2015-06-06 LAB — PULMONARY FUNCTION TEST
DL/VA % pred: 101 %
DL/VA: 4.63 ml/min/mmHg/L
DLCO unc % pred: 70 %
DLCO unc: 15.22 ml/min/mmHg
FEF 25-75 POST: 0.97 L/s
FEF 25-75 Pre: 0.95 L/sec
FEF2575-%CHANGE-POST: 2 %
FEF2575-%PRED-PRE: 46 %
FEF2575-%Pred-Post: 48 %
FEV1-%CHANGE-POST: 0 %
FEV1-%Pred-Post: 67 %
FEV1-%Pred-Pre: 67 %
FEV1-Post: 1.51 L
FEV1-Pre: 1.51 L
FEV1FVC-%Change-Post: -2 %
FEV1FVC-%Pred-Pre: 91 %
FEV6-%Change-Post: 3 %
FEV6-%PRED-PRE: 74 %
FEV6-%Pred-Post: 77 %
FEV6-PRE: 2.11 L
FEV6-Post: 2.19 L
FEV6FVC-%CHANGE-POST: 0 %
FEV6FVC-%PRED-PRE: 103 %
FEV6FVC-%Pred-Post: 103 %
FVC-%Change-Post: 3 %
FVC-%PRED-PRE: 72 %
FVC-%Pred-Post: 75 %
FVC-Post: 2.2 L
FVC-Pre: 2.13 L
Post FEV1/FVC ratio: 69 %
Post FEV6/FVC ratio: 100 %
Pre FEV1/FVC ratio: 71 %
Pre FEV6/FVC Ratio: 100 %

## 2015-06-06 MED ORDER — FLUTICASONE PROPIONATE 50 MCG/ACT NA SUSP: 2.0000 | Freq: Every day | NASAL | Status: DC

## 2015-06-06 NOTE — Progress Notes (Signed)
Subjective:    Patient ID: Catherine Kelley, female    DOB: [birth year], 64 y.o.   MRN: [id number]  HPI   OV 03/14/2015  Chief Complaint  Patient presents with  . CAP    Breathing is improved since last OV. Still on Augmentin.     OV 08/03/2014  Chief Complaint  Patient presents with  . Follow-up    HFU- Pt recently d/c from [facility] for CAP.    64 year old previously healthy female with a smoking history. She was admitted early to mid August 2015 with right lower lobe community acquired pneumonia with septic shock. Review of records suggest that she was not intubated but she is coughing up purulent sputum during the course of the stay. Extensive etiologic workup was negative for specific etiology. Says since that culture negative community-acquired right lower lobe pneumonia with necrotizing features. In sputum had mixed flora Patient herself tells me today that she had symptoms dating back to June 2015 and it failed multiple courses of antibiotics. Prior to this she was apparently healthy. She was discharged to a rehabilitation facility 07/14/2014 with advice to continue IV imipenem and linezolid for 9 days. However today along that she still with a PICC line and still on IV antibiotics. She has no sputum. She was using a walker but she is gaining strength and today is the first day without a walker. It is unclear what her discharge plan from the nursing home is. Otherwise she is feeling better according to her and her husband. Her appetite is slowly returning.  I do note that she has not had HIV or autoimmune workup  She feels she is ready to be dc'd out of SNF. Wants to know if she can stand and work in [employer] all day. Lookng at short term disabiliuty  09/06/2014 Follow up  Returns for a 1 month  follow up for Pneumonia  Pt has had a RLL cavitary PNA complicated by septic shock w/ hospitalization in 07/2014  She was discharged on prolonged IV abx w/ Primaxin, Zyvox x 21 days.  Seen last ov w/  d/c of abx . Labs showed autoimmune and HIV tests were neg. (ANA tr positive -not felt to be clinically significant )  Was discharged to rehab briefly and now back on home  Out of work since discharge, still feels weak .  Quit smoking after discharge , but admits she cheated. But is going to get nicotine patch to help her quit.  Discussed smoking cessation.  Reports breathing has been doing well since last ov, no new complaints.    10/05/14 Acute OV  Complains of sore throat, prod cough with "thick and gewy" off-white mucus x1 week.  On pred taper per PCP for lumbar back pain (10/29) also started on flexeril and vicodin .  She has been doing better until last week .  CXR today shows new right sided densities .  No fever , chest pain, orthopnea, edema or n/v.  Appetite is good .  No trouble swallowing.  Still smoking , advised on smoking cessation  >Levquin '750mg'$  x 7 d    10/11/14  Follow up PNA  Returns for follow up for PNA .  Was seen 1 week ago found to have PNA .  CXR showed new sided densities  Started on levaquin , took last dose today.  Still smoking , advised on smoking cessation  She denies any overt reflux or dysphagia Patient is feeling some better but has continued cough  with thick mucus She also is very fatigued Does not feel like she can go back to work . She denies any hemoptysis, chest pain, orthopnea, PND, or increased leg swelling  PFT today showed an FEV1 of 1.45 L, which was 64% of predicted, ratio of 72%, no significant bronchodilator response, FVC at 70%, air trapping noted. Diffusing capacity decreased at 67% >>CXR >improving RLL PNA   10/25/2014 Follow up PNA /COPD smoker  Patient returns for a two-week follow-up.  patient was admitted earlier this year in August for a cavitary right sided pneumonia with sepsis.  She was treated with aggressive IV antibiotics, and discharged on prolonged antibiotic course  Her chest x-ray continued to improve  With serial  follow-up.  3 weeks ago. Patient had increase cough and congestion. Was found to have new right-sided infiltrates consistent with pneumonia.  She was started on  Levaquin 750 mg for 7 days.  Chest x-ray 2 weeks ago showed improvement  in right-sided  consolidation  she returns today with improved symptoms. Reports breathing is improved but still having a lot of congestion esp at night tan/yellow/off-white mucus, some discomfort in the right rib area.  Feels the Anoro is working well for her  she continues to smoke but has cut back. We discussed smoking cessation  Appetite  And energy level is improving She denies any fever, hemoptysis, chest pain, orthopnea, or overt reflux nausea, vomiting, or diarrhea  chest x-ray today shows persistent right-sided  Densities with slight progression. >>CT chest    02/21/2015 Acute OV  Present for an acute office visit .  Complains of fever  prod cough (yellow, green) for 1 week then worse for 3 days .  Denies sob, wheezing, or chest discomfort, hemoptyiss , edema or rash.  Working at [employer] .  Last CT chest 02/07/15 showed severe redsidual bronchiectasis RLL with severe plugging Wants some cough syrup to help rest , cough keeping her up at night.     OV 03/14/2015  Chief Complaint  Patient presents with  . CAP    Breathing is improved since last OV. Still on Augmentin.    Follow-up right lower lobe necrotizing pneumonia from summer 2015. She now  has residual right lower lobe bronchiectasis with severe mucus plugging and recurrent exacerbations as evidence on CT scan of the chest March 2016. She most recently a week ago saw my colleague Dr. Danton Sewer for an exacerbation. Given Augmentin for 2 weeks. She's finished one week of Augmentin and is feeling better. She reports that even at baseline she has chronic sputum production that is discolored. She is unable to quantify the amount but she says it is significant. Current Augmentin therapy has helped  her. Overall she is frustrated by her quality of life and the chronic cough unexpected in production and repeated exacerbations.   Dg Chest 2 View  03/14/2015   CLINICAL DATA:  Cough, shortness of breath, followup pneumonia  EXAM: CHEST  2 VIEW  COMPARISON:  Chest x-ray of 02/21/2015 and CT chest of 02/06/2005  FINDINGS: There is little change in opacity within the right lower lobe most consistent with pneumonia. There is again noted to be 8 and focus of cavitation within the right lower low which was demonstrated on prior CT. No new parenchymal infiltrate is seen. No pleural effusion is noted. Mediastinal and hilar contours are unremarkable. The heart is within normal limits in size. No bony abnormality is seen.  IMPRESSION: Little change in probable pneumonia in the right  lower lobe with an area of cavitation as demonstrated by prior CT. No new abnormality.   Electronically Signed   By: Ivar Drape M.D.   On: 03/14/2015 16:26      OV 06/06/2015  Chief Complaint  Patient presents with  . Follow-up    PFT done today. c/o prod cough with yellow occ green mucus. Denies any wheezing, cp or tightness.  Followup for    RLL pneumonia summer 2015 with residual bronchiectasis March 2016:   - Currently no infectious symptoims but CXR May 2016 still showed only slow resolution with residual infiltrates   Smoking - new remission  :  reports that she quit smoking 11 days ago. Her smoking use included Cigarettes. She has a 42 pack-year smoking history. She has never used smokeless tobacco.   COPD :   doing Spiriva alone. Symptom burden ok she says but on CAT Score it is 30 and high. PFT 06/06/2015 = gold stage 2 copd - so symmptms are out out proportion. Does not want to incrase MDI. OF symptoms cough appears main issue for her subjectively. Moderate ins everity. Mostly at night esp when she lies down. Not on GERD rx. On atrovent spray but not using it     CAT COPD Symptom & Quality of Life Score (GSK  trademark) 0 is no burden. 5 is highest burden 06/06/2015   Never Cough -> Cough all the time 4  No phlegm in chest -> Chest is full of phlegm 4  No chest tightness -> Chest feels very tight 3  No dyspnea for 1 flight stairs/hill -> Very dyspneic for 1 flight of stairs 4  No limitations for ADL at home -> Very limited with ADL at home 3  Confident leaving home -> Not at all confident leaving home 5  Sleep soundly -> Do not sleep soundly because of lung condition 4  Lots of Energy -> No energy at all 3  TOTAL Score (max 40)  30       Current outpatient prescriptions:  .  albuterol (PROVENTIL HFA;VENTOLIN HFA) 108 (90 BASE) MCG/ACT inhaler, Inhale 2 puffs into the lungs every 6 (six) hours as needed for wheezing or shortness of breath., Disp: 1 Inhaler, Rfl: 5 .  albuterol (PROVENTIL) (2.5 MG/3ML) 0.083% nebulizer solution, Take 3 mLs (2.5 mg total) by nebulization every 3 (three) hours as needed for wheezing or shortness of breath., Disp: 75 mL, Rfl: 12 .  ALPRAZolam (XANAX) 0.5 MG tablet, Take 0.5 mg by mouth 2 (two) times daily., Disp: , Rfl:  .  benzonatate (TESSALON) 100 MG capsule, TAKE 1-2 CAPSULES (100-200 MG TOTAL) BY MOUTH 3 (THREE) TIMES DAILY AS NEEDED FOR COUGH., Disp: 60 capsule, Rfl: 2 .  Calcium Carbonate-Vitamin D (CALCIUM-VITAMIN D) 500-200 MG-UNIT per tablet, Take 1 tablet by mouth daily., Disp: , Rfl:  .  divalproex (DEPAKOTE ER) 500 MG 24 hr tablet, Take 500 mg by mouth at bedtime. , Disp: , Rfl:  .  fluocinonide ointment (LIDEX) 0.05 %, as needed. , Disp: , Rfl: 2 .  furosemide (LASIX) 20 MG tablet, TAKE 2 TABLETS BY MOUTH EVERY DAY, Disp: 180 tablet, Rfl: 1 .  guaiFENesin (MUCINEX) 600 MG 12 hr tablet, Take 600 mg by mouth 2 (two) times daily as needed., Disp: , Rfl:  .  ipratropium (ATROVENT) 0.03 % nasal spray, Place 2 sprays into the nose 2 (two) times daily. (Patient taking differently: Place 2 sprays into the nose as needed. ), Disp: 30 mL, Rfl:  0 .   losartan-hydrochlorothiazide (HYZAAR) 50-12.5 MG per tablet, TAKE 0.5 TABLETS BY MOUTH DAILY., Disp: 30 tablet, Rfl: 5 .  methocarbamol (ROBAXIN) 500 MG tablet, TAKE 1 TABLET (500 MG TOTAL) BY MOUTH 4 (FOUR) TIMES DAILY., Disp: 40 tablet, Rfl: 2 .  Multiple Vitamin (MULTIVITAMIN) tablet, Take 1 tablet by mouth daily., Disp: , Rfl:  .  nadolol (CORGARD) 40 MG tablet, TAKE 1/2 TABLET BY MOUTH EVERY DAY, Disp: 45 tablet, Rfl: 3 .  nortriptyline (PAMELOR) 50 MG capsule, Take 100 mg by mouth at bedtime., Disp: , Rfl:  .  senna (SENOKOT) 8.6 MG TABS tablet, Take 2 tablets (17.2 mg total) by mouth at bedtime., Disp: 120 each, Rfl: 0 .  tiotropium (SPIRIVA) 18 MCG inhalation capsule, Place 1 capsule (18 mcg total) into inhaler and inhale daily., Disp: 30 capsule, Rfl: 6  Allergies  Allergen Reactions  . Augmentin [Amoxicillin-Pot Clavulanate]     UNKNOWN  . Ceclor [Cefaclor]     hives  . Escitalopram Oxalate     Pt does not recall ever taking medication  . Penicillins     Rash------pt stated she CAN take Penicillin  . Sertraline Hcl     UNKNOWN  . Sulfa Drugs Cross Reactors     Hives and rash    Immunization History  Administered Date(s) Administered  . Influenza Split 09/29/2012, 08/31/2013  . Influenza,inj,Quad PF,36+ Mos 09/06/2014, 09/28/2014  . Pneumococcal Conjugate-13 12/13/2014  . Pneumococcal Polysaccharide-23 08/31/2013     Review of Systems  Constitutional: Negative for fever and unexpected weight change.  HENT: Negative for congestion, dental problem, ear pain, nosebleeds, postnasal drip, rhinorrhea, sinus pressure, sneezing, sore throat and trouble swallowing.   Eyes: Negative for redness and itching.  Respiratory: Positive for cough. Negative for chest tightness, shortness of breath and wheezing.   Cardiovascular: Negative for palpitations and leg swelling.  Gastrointestinal: Negative for nausea and vomiting.  Genitourinary: Negative for dysuria.  Musculoskeletal:  Negative for joint swelling.  Skin: Negative for rash.  Neurological: Negative for headaches.  Hematological: Does not bruise/bleed easily.  Psychiatric/Behavioral: Negative for dysphoric mood. The patient is not nervous/anxious.        Objective:   Physical Exam  Constitutional: She is oriented to person, place, and time. She appears well-developed and well-nourished. No distress.  HENT:  Head: Normocephalic and atraumatic.  Right Ear: External ear normal.  Left Ear: External ear normal.  Mouth/Throat: Oropharynx is clear and moist. No oropharyngeal exudate.  Eyes: Conjunctivae and EOM are normal. Pupils are equal, round, and reactive to light. Right eye exhibits no discharge. Left eye exhibits no discharge. No scleral icterus.  Neck: Normal range of motion. Neck supple. No JVD present. No tracheal deviation present. No thyromegaly present.  Cardiovascular: Normal rate, regular rhythm, normal heart sounds and intact distal pulses.  Exam reveals no gallop and no friction rub.   No murmur heard. Pulmonary/Chest: Effort normal. No respiratory distress. She has no wheezes. She has rales. She exhibits no tenderness.  Abdominal: Soft. Bowel sounds are normal. She exhibits no distension and no mass. There is no tenderness. There is no rebound and no guarding.  Musculoskeletal: Normal range of motion. She exhibits no edema or tenderness.  Lymphadenopathy:    She has no cervical adenopathy.  Neurological: She is alert and oriented to person, place, and time. She has normal reflexes. No cranial nerve deficit. She exhibits normal muscle tone. Coordination normal.  Skin: Skin is warm and dry. No rash noted. She is not  diaphoretic. No erythema. No pallor.  Psychiatric: She has a normal mood and affect. Her behavior is normal. Judgment and thought content normal.  Vitals reviewed.   Filed Vitals:   06/06/15 1618  BP: 122/78  Pulse: 80  Height: '5\' 2"'$  (1.575 m)  Weight: 139 lb (63.05 kg)  SpO2:  95%         Assessment & Plan:     ICD-9-CM ICD-10-CM   1. CAP (community acquired pneumonia) 20 J18.9 CT Chest Wo Contrast  2. Bronchiectasis, non-tuberculous 494.0 J47.9   3. COPD, moderate 496 J44.9   4. Smoking 305.1 Z72.0   5. Chronic cough 786.2 R05     #Right lower Lobe necrotizing CAP Pneumonia - Aug 2015  - slow radiological resoution as of CT chest dec 2015 but with residual bronchiectasis march 2016 but unresolved pneumonia on CT march 2016 and CXR may 2016 - do repeat CT chest wo contrast  In 3 weeks for followup; consider surgery for bronchiectasis if persists and focal   #CHronic cough   - given fact apppears more at night it might be independent of copd but copd/bronchiectasis could be playing a role  - START generic fluticasone inhaler 2 squirts each nostril daily - Continue atroven nasal spray - START OTC Prilosec '20mg'$  daily AM on empty stomach - IF no response work on bronchiectasis issues  #COPD - moderate   - confirmed on PFT 06/06/2015 that lung function si 66% of normal  - for now continue spiriva  1 puff daily - if symptoms do not improve then will step up treatment  #Smoking  -glad you quit 11 days ago - keep up good work  #Followup CT chest in 3 weeks Followup for cough response and CT findings with my  NP in 3 weeks   Dr. Brand Males, M.D., [facility].C.P Pulmonary and Critical Care Medicine Staff Physician [facility] Pager: [phone], If no answer or between  15:00h - 7:00h: call [phone]  06/06/2015 5:03 PM

## 2015-06-06 NOTE — Progress Notes (Signed)
PFT done today. 

## 2015-06-06 NOTE — Patient Instructions (Addendum)
   #  Right lower Lobe necrotizing CAP Pneumonia - Aug 2015  - slow radiological resoution as of CT chest dec 2015 but with residual bronchiectasis march 2016 but unresolved pneumonia on CT march 2016 and CXR may 2016 - do repeat CT chest wo contrast  In 3 weeks for followup   #CHronic cough   - given fact apppears more at night it might be independent of copd  - START generic fluticasone inhaler 2 squirts each nostril daily - Continue atroven nasal spray - START OTC Prilosec '20mg'$  daily AM on empty stomach  #COPD - moderate   - confirmed on PFT 06/06/2015 that lung function si 66% of normal  - for now continue spiriva  1 puff daily - if symptoms do not improve then will step up treatment  #Smoking  -glad you quit 11 days ago - keep up good work  #Followup CT chest in 3 weeks Followup for cough response and CT findings with my  NP in 3 weeks

## 2015-06-07 DIAGNOSIS — R059 Cough, unspecified: Secondary | ICD-10-CM | POA: Insufficient documentation

## 2015-06-07 DIAGNOSIS — R05 Cough: Secondary | ICD-10-CM | POA: Insufficient documentation

## 2015-06-25 ENCOUNTER — Other Ambulatory Visit: Payer: Self-pay | Admitting: Family Medicine

## 2015-06-27 ENCOUNTER — Inpatient Hospital Stay: Admission: RE | Admit: 2015-06-27 | Payer: Managed Care, Other (non HMO) | Source: Ambulatory Visit

## 2015-06-27 ENCOUNTER — Other Ambulatory Visit: Payer: Managed Care, Other (non HMO)

## 2015-06-27 NOTE — Telephone Encounter (Signed)
Dr. Tamala Julian do you want to refill the Tessalon for patient or should she RTC?

## 2015-07-02 ENCOUNTER — Ambulatory Visit (INDEPENDENT_AMBULATORY_CARE_PROVIDER_SITE_OTHER): Payer: Managed Care, Other (non HMO) | Admitting: Family Medicine

## 2015-07-02 VITALS — BP 118/67 | HR 89 | Temp 99.4°F | Resp 20 | Ht 62.0 in | Wt 142.1 lb

## 2015-07-02 DIAGNOSIS — R053 Chronic cough: Secondary | ICD-10-CM

## 2015-07-02 DIAGNOSIS — N3000 Acute cystitis without hematuria: Secondary | ICD-10-CM

## 2015-07-02 DIAGNOSIS — R609 Edema, unspecified: Secondary | ICD-10-CM | POA: Diagnosis not present

## 2015-07-02 DIAGNOSIS — M5416 Radiculopathy, lumbar region: Secondary | ICD-10-CM

## 2015-07-02 DIAGNOSIS — M5417 Radiculopathy, lumbosacral region: Secondary | ICD-10-CM

## 2015-07-02 DIAGNOSIS — I872 Venous insufficiency (chronic) (peripheral): Secondary | ICD-10-CM | POA: Diagnosis not present

## 2015-07-02 DIAGNOSIS — L03119 Cellulitis of unspecified part of limb: Secondary | ICD-10-CM

## 2015-07-02 DIAGNOSIS — J449 Chronic obstructive pulmonary disease, unspecified: Secondary | ICD-10-CM

## 2015-07-02 DIAGNOSIS — L02619 Cutaneous abscess of unspecified foot: Secondary | ICD-10-CM | POA: Diagnosis not present

## 2015-07-02 DIAGNOSIS — R300 Dysuria: Secondary | ICD-10-CM

## 2015-07-02 DIAGNOSIS — R05 Cough: Secondary | ICD-10-CM | POA: Diagnosis not present

## 2015-07-02 LAB — POCT CBC
GRANULOCYTE PERCENT: 68.5 % (ref 37–80)
HCT, POC: 37.3 % — AB (ref 37.7–47.9)
HEMOGLOBIN: 12.1 g/dL — AB (ref 12.2–16.2)
LYMPH, POC: 2.2 (ref 0.6–3.4)
MCH, POC: 28.9 pg (ref 27–31.2)
MCHC: 32.5 g/dL (ref 31.8–35.4)
MCV: 88.7 fL (ref 80–97)
MID (cbc): 0.5 (ref 0–0.9)
MPV: 6.8 fL (ref 0–99.8)
POC Granulocyte: 6 (ref 2–6.9)
POC LYMPH PERCENT: 25.6 %L (ref 10–50)
POC MID %: 5.9 %M (ref 0–12)
Platelet Count, POC: 319 10*3/uL (ref 142–424)
RBC: 4.2 M/uL (ref 4.04–5.48)
RDW, POC: 14.4 %
WBC: 8.7 10*3/uL (ref 4.6–10.2)

## 2015-07-02 LAB — POCT URINALYSIS DIPSTICK
BILIRUBIN UA: NEGATIVE
Glucose, UA: NEGATIVE
Ketones, UA: NEGATIVE
NITRITE UA: POSITIVE
PH UA: 8.5
PROTEIN UA: NEGATIVE
RBC UA: NEGATIVE
Spec Grav, UA: 1.01
UROBILINOGEN UA: 0.2

## 2015-07-02 LAB — POCT UA - MICROSCOPIC ONLY
CASTS, UR, LPF, POC: NEGATIVE
CRYSTALS, UR, HPF, POC: NEGATIVE
Mucus, UA: NEGATIVE
Yeast, UA: NEGATIVE

## 2015-07-02 LAB — POCT SEDIMENTATION RATE: POCT SED RATE: 60 mm/hr — AB (ref 0–22)

## 2015-07-02 MED ORDER — CEPHALEXIN 500 MG PO CAPS: 500.0000 mg | ORAL_CAPSULE | Freq: Four times a day (QID) | ORAL | Status: DC

## 2015-07-02 MED ORDER — LIDOCAINE 5 % EX PTCH: 1.0000 | MEDICATED_PATCH | CUTANEOUS | Status: DC

## 2015-07-02 MED ORDER — CEFTRIAXONE SODIUM 1 G IJ SOLR
1.0000 g | Freq: Once | INTRAMUSCULAR | Status: AC
  Administered 2015-07-02: 1 g via INTRAMUSCULAR

## 2015-07-02 MED ORDER — HYDROCODONE-ACETAMINOPHEN 5-325 MG PO TABS: 1.0000 | ORAL_TABLET | Freq: Four times a day (QID) | ORAL | Status: DC | PRN

## 2015-07-02 NOTE — Progress Notes (Signed)
Subjective:    Patient ID: Catherine Kelley, female    DOB: 02/15/1951, 64 y.o.   MRN: [id number] This chart was scribed for Delman Cheadle, MD by Marti Sleigh, Medical Scribe. This patient was seen in Room 2 and the patient's care was started a 8:09 PM.  Chief Complaint  Patient presents with  . Leg Swelling    bilateral  . Fever  . chest congestion  . Urinary Urgency    HPI HPI Comments: Catherine Kelley is a 64 y.o. female with a past hx of COPD who presents to [facility] complaining of increasing lower extremity edema with associated redness and soreness for the last week. Pt had a measured fever of 101.4 yesterday.  Pt is also complaining of increasing productive cough with associated chest congestion for the last week. Pt saw her pulmonologist at [facility] one month ago. The pt has a hx of severe pneumonia with septic shock.  Her PCP is Dr. Tamala Julian.  Saw pulmonology 1 month ago, and at that time her pulmonologist expressed concern for residual bronchiectasis and unresolved pneumonia on CT and x-ray, which were last done may 2016 with initial illness December 2015. Pt was recommended to have a chest CT in three weeks, which was not done. Pt had appointment for chest CT last Wednesday that she cancelled. She has follow up office visit with pulmonary this Wednesday. Pt has a hx of bipolar disorder and at last PCP visit was in manic episode, which she appears to be continuing now. Pt has a phycologist Dr. Toy Care that follows her closely. Pt was last on abx two months ago and was on a course of Augmentin for her pulmonary sx. Has a course of Augmentin three times within the past 4 mos. Keflex, tolerated six months prior.    Past Medical History  Diagnosis Date  . Hypertension     essential hypertension  . Tobacco abuse     smoking about 1/2 pk of cigarettes a day  . Anxiety   . Depression     psychiatrist Dr. Toy Care  . Respiratory infection   . Shortness of breath   . Malaise   . Lightheadedness   .  Emphysematous COPD     changes in right base along with patchy areas on last x-ray  . COPD (chronic obstructive pulmonary disease)   . History of echocardiogram 12/24/2006    Est. EF of 51-88% NORMAL LV SYSTOLIC FUNCTION WITH IMPAIRED RELAXATION -- MILD AORTIC SCLEROSIS -- NORMAL PALONARY ARTERY PRESSURE -- NO OLD ECHOS FOR COMPARISON -- [name], MD  . History of cardiovascular stress test 08/15/2004    EF of 70% -- Normal stress cardiolite.  There is no evidence of ischemia and there is normal LV function. -- [name]. MD  . Diastolic dysfunction   . Allergy   . Asthma   . Heart murmur   . Pneumonia     "several times since May 2015" (07/06/2014)  . Chronic bronchitis     "get it alot; maybe not q yr" (07/06/2014)   Allergies  Allergen Reactions  . Augmentin [Amoxicillin-Pot Clavulanate]     UNKNOWN  . Ceclor [Cefaclor]     hives  . Escitalopram Oxalate     Pt does not recall ever taking medication  . Penicillins     Rash------pt stated she CAN take Penicillin  . Sertraline Hcl     UNKNOWN  . Sulfa Drugs Cross Reactors     Hives and rash  Current Outpatient Prescriptions on File Prior to Visit  Medication Sig Dispense Refill  . albuterol (PROVENTIL) (2.5 MG/3ML) 0.083% nebulizer solution Take 3 mLs (2.5 mg total) by nebulization every 3 (three) hours as needed for wheezing or shortness of breath. 75 mL 12  . ALPRAZolam (XANAX) 0.5 MG tablet Take 0.5 mg by mouth 2 (two) times daily.    . benzonatate (TESSALON) 100 MG capsule TAKE 1 TO 2 CAPSULES BY MOUTH 3 TIMES A DAY AS NEEDED FOR COUGH 60 capsule 2  . Calcium Carbonate-Vitamin D (CALCIUM-VITAMIN D) 500-200 MG-UNIT per tablet Take 1 tablet by mouth daily.    . divalproex (DEPAKOTE ER) 500 MG 24 hr tablet Take 500 mg by mouth at bedtime.     . fluocinonide ointment (LIDEX) 0.05 % as needed.   2  . fluticasone (FLONASE) 50 MCG/ACT nasal spray Place 2 sprays into both nostrils daily. 16 g 2  . furosemide (LASIX)  20 MG tablet TAKE 2 TABLETS BY MOUTH EVERY DAY 180 tablet 1  . guaiFENesin (MUCINEX) 600 MG 12 hr tablet Take 600 mg by mouth 2 (two) times daily as needed.    [name] ipratropium (ATROVENT) 0.03 % nasal spray Place 2 sprays into the nose 2 (two) times daily. (Patient taking differently: Place 2 sprays into the nose as needed. ) 30 mL 0  . losartan-hydrochlorothiazide (HYZAAR) 50-12.5 MG per tablet TAKE 0.5 TABLETS BY MOUTH DAILY. 30 tablet 5  . methocarbamol (ROBAXIN) 500 MG tablet TAKE 1 TABLET (500 MG TOTAL) BY MOUTH 4 (FOUR) TIMES DAILY. 40 tablet 2  . Multiple Vitamin (MULTIVITAMIN) tablet Take 1 tablet by mouth daily.    . nadolol (CORGARD) 40 MG tablet TAKE 1/2 TABLET BY MOUTH EVERY DAY 45 tablet 3  . nortriptyline (PAMELOR) 50 MG capsule Take 100 mg by mouth at bedtime.    . senna (SENOKOT) 8.6 MG TABS tablet Take 2 tablets (17.2 mg total) by mouth at bedtime. 120 each 0  . albuterol (PROVENTIL HFA;VENTOLIN HFA) 108 (90 BASE) MCG/ACT inhaler Inhale 2 puffs into the lungs every 6 (six) hours as needed for wheezing or shortness of breath. (Patient not taking: Reported on 07/02/2015) 1 Inhaler 5  . tiotropium (SPIRIVA) 18 MCG inhalation capsule Place 1 capsule (18 mcg total) into inhaler and inhale daily. (Patient not taking: Reported on 07/02/2015) 30 capsule 6   No current facility-administered medications on file prior to visit.   Depression screen [facility] 2/9 04/29/2015 04/19/2015  Decreased Interest 0 0  Down, Depressed, Hopeless 1 0  PHQ - 2 Score 1 0     Review of Systems  Constitutional: Positive for fever. Negative for chills.  HENT: Positive for congestion.   Respiratory: Positive for cough, chest tightness and shortness of breath. Negative for wheezing.        Crackles  Cardiovascular: Positive for leg swelling.  Genitourinary: Positive for urgency, frequency and decreased urine volume. Negative for dysuria and difficulty urinating.  Musculoskeletal: Positive for myalgias, back pain,  joint swelling, arthralgias and gait problem.  Skin: Positive for color change and rash. Negative for wound.  Psychiatric/Behavioral: Positive for sleep disturbance and dysphoric mood.       Objective:  BP 118/67 mmHg  Pulse 89  Temp(Src) 99.4 F (37.4 C) (Oral)  Resp 20  Ht '5\' 2"'$  (1.575 m)  Wt 142 lb 2 oz (64.467 kg)  BMI 25.99 kg/m2  SpO2 95%  Physical Exam  Constitutional: She is oriented to person, place, and time. She appears well-developed  and well-nourished. No distress.  HENT:  Head: Normocephalic and atraumatic.  Eyes: Pupils are equal, round, and reactive to light.  Neck: Neck supple.  Cardiovascular: Normal rate.   Tachycardic with regular rhythm. 2/6 systolic injection murmur. 2+ pitting edema in bilateral lower extremities.  Pulmonary/Chest: Effort normal. No respiratory distress.  Very few course rales on the right greater than the left.   Musculoskeletal: Normal range of motion.  Left lower extremities with severe bright blanching warm erythema extedning medially over entire surface of foot to mid-calf on left. Right with similar appearance, but much more mild extending to lower third of calf. Moderately severe vericose vieins bilaterally.   Neurological: She is alert and oriented to person, place, and time. Coordination normal.  Skin: Skin is warm and dry. She is not diaphoretic.  Psychiatric: She has a normal mood and affect. Her behavior is normal.  Nursing note and vitals reviewed.  Results for orders placed or performed in visit on 07/02/15  POCT UA - Microscopic Only  Result Value Ref Range   WBC, Ur, HPF, POC 2-4    RBC, urine, microscopic 0-2    Bacteria, U Microscopic trace    Mucus, UA neg    Epithelial cells, urine per micros 1-3    Crystals, Ur, HPF, POC neg    Casts, Ur, LPF, POC neg    Yeast, UA neg   POCT urinalysis dipstick  Result Value Ref Range   Color, UA yellow    Clarity, UA clear    Glucose, UA neg    Bilirubin, UA neg     Ketones, UA neg    Spec Grav, UA 1.010    Blood, UA neg    pH, UA 8.5    Protein, UA neg    Urobilinogen, UA 0.2    Nitrite, UA positive    Leukocytes, UA Trace (A) Negative  POCT CBC  Result Value Ref Range   WBC 8.7 4.6 - 10.2 K/uL   Lymph, poc 2.2 0.6 - 3.4   POC LYMPH PERCENT 25.6 10 - 50 %L   MID (cbc) 0.5 0 - 0.9   POC MID % 5.9 0 - 12 %M   POC Granulocyte 6.0 2 - 6.9   Granulocyte percent 68.5 37 - 80 %G   RBC 4.20 4.04 - 5.48 M/uL   Hemoglobin 12.1 (A) 12.2 - 16.2 g/dL   HCT, POC 37.3 (A) 37.7 - 47.9 %   MCV 88.7 80 - 97 fL   MCH, POC 28.9 27 - 31.2 pg   MCHC 32.5 31.8 - 35.4 g/dL   RDW, POC 14.4 %   Platelet Count, POC 319 142 - 424 K/uL   MPV 6.8 0 - 99.8 fL  POCT SEDIMENTATION RATE  Result Value Ref Range   POCT SED RATE 60 (A) 0 - 22 mm/hr        Assessment & Plan:   1. Dysuria   2. Edema   3. Cellulitis and abscess of foot, except toes - outlined. 1g IM Rocephin given now. Start Keflex qid tomorrow morning. Recheck in 24 hrs w/ PCP. Elevate and heat to feet.  Rx'ed few hydrocodone which pt reports she has done very well on prior - uses only 1/2 tab at a time and is highly aware of the addictive nature of narcotics.  4. Chronic cough   5. Lumbar back pain with radiculopathy affecting right lower extremity - try lidoderm patch as has failed meloxicam, ibuprofen, methocarbamol, nortryptiline, tramadol, and hydrocodone.  6. COPD, moderate - has f/u w/ pulm sched in 2d - was supposed to have a repeat chest CT sev wks ago to f/u on her prolonged pna and bronchiectasis but pt reports she could not afford this and Gi was unable to obtain insurance authorization  7. Venous insufficiency (chronic) (peripheral)   8. Acute cystitis without hematuria - 1g IM Rocephin given now. Start Keflex qid tomorrow morning. Recheck in 24 hrs w/ PCP. UClx P.   Pt w/ numerous complaints today so I was unable to thoroughly eval all of her wide variety of sxs.  Pt will f/u tomorrow - in 24  hrs - with her PCP Dr. Tamala Julian to recheck on cellulitis and ensure UTI responding.  Pt going to start Kegel exercises to help her baseline mixed urinary incontinence - forgot to give pt how-to handout but will do at next OV. Could consider pelvic PT w/ Pear[facility] Cables if having trouble w/ these and not getting sig benefit.  Orders Placed This Encounter  Procedures  . Urine culture  . Comprehensive metabolic panel  . POCT UA - Microscopic Only  . POCT urinalysis dipstick  . POCT CBC  . POCT SEDIMENTATION RATE    Meds ordered this encounter  Medications  . cefTRIAXone (ROCEPHIN) injection 1 g    Sig:     Order Specific Question:  Antibiotic Indication:    Answer:  Cellulitis  . lidocaine (LIDODERM) 5 %    Sig: Place 1 patch onto the skin daily. Remove & Discard patch within 12 hours or as directed by MD    Dispense:  30 patch    Refill:  0  . HYDROcodone-acetaminophen (NORCO/VICODIN) 5-325 MG per tablet    Sig: Take 1 tablet by mouth every 6 (six) hours as needed for moderate pain.    Dispense:  30 tablet    Refill:  0  . cephALEXin (KEFLEX) 500 MG capsule    Sig: Take 1 capsule (500 mg total) by mouth 4 (four) times daily.    Dispense:  28 capsule    Refill:  0    I personally performed the services described in this documentation, which was scribed in my presence. The recorded information has been reviewed and considered, and addended by me as needed.  Delman Cheadle, MD MPH

## 2015-07-02 NOTE — Patient Instructions (Signed)
Cellulitis Cellulitis is an infection of the skin and the tissue beneath it. The infected area is usually red and tender. Cellulitis occurs most often in the arms and lower legs.  CAUSES  Cellulitis is caused by bacteria that enter the skin through cracks or cuts in the skin. The most common types of bacteria that cause cellulitis are staphylococci and streptococci. SIGNS AND SYMPTOMS   Redness and warmth.  Swelling.  Tenderness or pain.  Fever. DIAGNOSIS  Your health care provider can usually determine what is wrong based on a physical exam. Blood tests may also be done. TREATMENT  Treatment usually involves taking an antibiotic medicine. HOME CARE INSTRUCTIONS   Take your antibiotic medicine as directed by your health care provider. Finish the antibiotic even if you start to feel better.  Keep the infected arm or leg elevated to reduce swelling.  Apply a warm cloth to the affected area up to 4 times per day to relieve pain.  Take medicines only as directed by your health care provider.  Keep all follow-up visits as directed by your health care provider. SEEK MEDICAL CARE IF:   You notice red streaks coming from the infected area.  Your red area gets larger or turns dark in color.  Your bone or joint underneath the infected area becomes painful after the skin has healed.  Your infection returns in the same area or another area.  You notice a swollen bump in the infected area.  You develop new symptoms.  You have a fever. SEEK IMMEDIATE MEDICAL CARE IF:   You feel very sleepy.  You develop vomiting or diarrhea.  You have a general ill feeling (malaise) with muscle aches and pains. MAKE SURE YOU:   Understand these instructions.  Will watch your condition.  Will get help right away if you are not doing well or get worse. Document Released: 08/27/2005 Document Revised: 04/03/2014 Document Reviewed: 02/02/2012 [facility] Patient Information 2015 [place], [place].  This information is not intended to replace advice given to you by your health care provider. Make sure you discuss any questions you have with your health care provider. Urinary Tract Infection Urinary tract infections (UTIs) can develop anywhere along your urinary tract. Your urinary tract is your body's drainage system for removing wastes and extra water. Your urinary tract includes two kidneys, two ureters, a bladder, and a urethra. Your kidneys are a pair of bean-shaped organs. Each kidney is about the size of your fist. They are located below your ribs, one on each side of your spine. CAUSES Infections are caused by microbes, which are microscopic organisms, including fungi, viruses, and bacteria. These organisms are so small that they can only be seen through a microscope. Bacteria are the microbes that most commonly cause UTIs. SYMPTOMS  Symptoms of UTIs may vary by age and gender of the patient and by the location of the infection. Symptoms in young women typically include a frequent and intense urge to urinate and a painful, burning feeling in the bladder or urethra during urination. Older women and men are more likely to be tired, shaky, and weak and have muscle aches and abdominal pain. A fever may mean the infection is in your kidneys. Other symptoms of a kidney infection include pain in your back or sides below the ribs, nausea, and vomiting. DIAGNOSIS To diagnose a UTI, your caregiver will ask you about your symptoms. Your caregiver also will ask to provide a urine sample. The urine sample will be tested for bacteria  and white blood cells. White blood cells are made by your body to help fight infection. TREATMENT  Typically, UTIs can be treated with medication. Because most UTIs are caused by a bacterial infection, they usually can be treated with the use of antibiotics. The choice of antibiotic and length of treatment depend on your symptoms and the type of bacteria causing your  infection. HOME CARE INSTRUCTIONS  If you were prescribed antibiotics, take them exactly as your caregiver instructs you. Finish the medication even if you feel better after you have only taken some of the medication.  Drink enough water and fluids to keep your urine clear or pale yellow.  Avoid caffeine, tea, and carbonated beverages. They tend to irritate your bladder.  Empty your bladder often. Avoid holding urine for long periods of time.  Empty your bladder before and after sexual intercourse.  After a bowel movement, women should cleanse from front to back. Use each tissue only once. SEEK MEDICAL CARE IF:   You have back pain.  You develop a fever.  Your symptoms do not begin to resolve within 3 days. SEEK IMMEDIATE MEDICAL CARE IF:   You have severe back pain or lower abdominal pain.  You develop chills.  You have nausea or vomiting.  You have continued burning or discomfort with urination. MAKE SURE YOU:   Understand these instructions.  Will watch your condition.  Will get help right away if you are not doing well or get worse. Document Released: 08/27/2005 Document Revised: 05/18/2012 Document Reviewed: 12/26/2011 [facility] Patient Information 2015 [place], [place]. This information is not intended to replace advice given to you by your health care provider. Make sure you discuss any questions you have with your health care provider.

## 2015-07-03 LAB — COMPREHENSIVE METABOLIC PANEL
ALBUMIN: 3.6 g/dL (ref 3.6–5.1)
ALK PHOS: 106 U/L (ref 33–130)
ALT: 18 U/L (ref 6–29)
AST: 26 U/L (ref 10–35)
BILIRUBIN TOTAL: 0.6 mg/dL (ref 0.2–1.2)
BUN: 7 mg/dL (ref 7–25)
CALCIUM: 9 mg/dL (ref 8.6–10.4)
CO2: 29 mmol/L (ref 20–31)
Chloride: 89 mmol/L — ABNORMAL LOW (ref 98–110)
Creat: 0.53 mg/dL (ref 0.50–0.99)
GLUCOSE: 86 mg/dL (ref 65–99)
Potassium: 4.1 mmol/L (ref 3.5–5.3)
Sodium: 127 mmol/L — ABNORMAL LOW (ref 135–146)
Total Protein: 7.8 g/dL (ref 6.1–8.1)

## 2015-07-04 ENCOUNTER — Ambulatory Visit: Payer: Managed Care, Other (non HMO) | Admitting: Adult Health

## 2015-07-04 ENCOUNTER — Telehealth: Payer: Self-pay

## 2015-07-04 ENCOUNTER — Telehealth: Payer: Self-pay | Admitting: Internal Medicine

## 2015-07-04 NOTE — Telephone Encounter (Signed)
PA completed for lidocaine patches on covermymeds. Approved through 07/03/2018, case # [id number]. Notified pharm.

## 2015-07-04 NOTE — Telephone Encounter (Signed)
Pt came in and had appt with TP at 11:45am and didn't show for the appt. Pt did state that she had been to see her PCP on Monday for UTI and cellulitis and was given abx and pain meds. Pt states she had a little wheezing and prod cough w/green mucus but states she has had this for about 2 weeks. Told pt I would place a message to DOD but she stated she says she has a f/u appt with PCP on Thursday and she will inform them when she goes. Nothing further needed.

## 2015-07-05 ENCOUNTER — Ambulatory Visit (INDEPENDENT_AMBULATORY_CARE_PROVIDER_SITE_OTHER): Payer: Managed Care, Other (non HMO) | Admitting: Family Medicine

## 2015-07-05 VITALS — BP 110/68 | HR 101 | Temp 99.0°F | Resp 20 | Ht 63.0 in | Wt 144.0 lb

## 2015-07-05 DIAGNOSIS — N3090 Cystitis, unspecified without hematuria: Secondary | ICD-10-CM

## 2015-07-05 DIAGNOSIS — J471 Bronchiectasis with (acute) exacerbation: Secondary | ICD-10-CM

## 2015-07-05 DIAGNOSIS — B961 Klebsiella pneumoniae [K. pneumoniae] as the cause of diseases classified elsewhere: Secondary | ICD-10-CM

## 2015-07-05 DIAGNOSIS — I872 Venous insufficiency (chronic) (peripheral): Secondary | ICD-10-CM | POA: Diagnosis not present

## 2015-07-05 DIAGNOSIS — L03115 Cellulitis of right lower limb: Secondary | ICD-10-CM | POA: Diagnosis not present

## 2015-07-05 DIAGNOSIS — E871 Hypo-osmolality and hyponatremia: Secondary | ICD-10-CM

## 2015-07-05 DIAGNOSIS — M545 Low back pain, unspecified: Secondary | ICD-10-CM

## 2015-07-05 DIAGNOSIS — B9689 Other specified bacterial agents as the cause of diseases classified elsewhere: Secondary | ICD-10-CM | POA: Diagnosis not present

## 2015-07-05 LAB — URINE CULTURE

## 2015-07-05 LAB — POCT CBC
Granulocyte percent: 68.4 %G (ref 37–80)
HEMATOCRIT: 36.4 % — AB (ref 37.7–47.9)
HEMOGLOBIN: 12.4 g/dL (ref 12.2–16.2)
Lymph, poc: 3 (ref 0.6–3.4)
MCH, POC: 29.8 pg (ref 27–31.2)
MCHC: 34.2 g/dL (ref 31.8–35.4)
MCV: 87 fL (ref 80–97)
MID (cbc): 0.4 (ref 0–0.9)
MPV: 6.9 fL (ref 0–99.8)
POC Granulocyte: 7.2 — AB (ref 2–6.9)
POC LYMPH PERCENT: 28.1 %L (ref 10–50)
POC MID %: 3.5 %M (ref 0–12)
Platelet Count, POC: 361 10*3/uL (ref 142–424)
RBC: 4.18 M/uL (ref 4.04–5.48)
RDW, POC: 14 %
WBC: 10.5 10*3/uL — AB (ref 4.6–10.2)

## 2015-07-05 MED ORDER — CEFTRIAXONE SODIUM 1 G IJ SOLR
1.0000 g | Freq: Once | INTRAMUSCULAR | Status: AC
  Administered 2015-07-05: 1 g via INTRAMUSCULAR

## 2015-07-05 MED ORDER — DOXYCYCLINE HYCLATE 100 MG PO CAPS: 100.0000 mg | ORAL_CAPSULE | Freq: Two times a day (BID) | ORAL | Status: DC

## 2015-07-05 MED ORDER — TIZANIDINE HCL 4 MG PO TABS: 4.0000 mg | ORAL_TABLET | Freq: Four times a day (QID) | ORAL | Status: DC | PRN

## 2015-07-05 NOTE — Progress Notes (Signed)
Patient ID: Catherine Kelley, female   DOB: 1951-01-04, 64 y.o.   MRN: [id number]   Subjective:  This chart was scribed for [name], MD by [facility], medical scribe at [facility].The patient was seen in exam room 12 and the patient's care was started at 6:59 PM.   Patient ID: Catherine Kelley, female    DOB: [birth year], 64 y.o.   MRN: [id number]  07/05/2015  Back Pain  HPI HPI Comments: Catherine Kelley is a 64 y.o. female who presents to [facility] for a follow up. Last seen by Dr. Brigitte Pulse on 07/02/2015.    Cellulitis: At last visit she was given rocephin and keflex for cellulitis of her feet B. She has had a fever since that visit, and chills last night, but no night sweats. Today, the redness and swelling in her legs have worsened. Pt states her legs are very tight and painful. Still taking her keflex.  She did work at [employer] job today so feels that leg swelling has worsened due to standing all day.  Chronic venous stasis.  Did not wear compression stockings today.  UTI: Dysuria has improved but still has an urgency since 07/02/2015 visit. Woke up one time for to use the bathroom last night. Urine culture was positive for KLEBSIELLA at last visit.    Back pain: Also complains of persistent bilateral lower back pain that does not radiate down her legs for the past week. Similar pain after a fall six years ago. First time today she used pain medication at work. Pt fell last night while at home. No known trauma or injury. She has seen by [facility] six years ago after her fall. She Denies numbness, weakness in her legs, urine or bowel incontinence and saddle anesthesia.  Chronic recurrent lower back pain.  Last lumbar spine films 04/29/15 revealed degenerative changes with anterolisthesis of L4-L5.  Cough: Producing a green yellow cough for five days, and sore throat began hurting her since she got here. Pain with swallowing on L.   Fatigue with exertion,  she is not short of breath. Denies headache or ear pain; no rhinorrhea; mild L nasal congestion.  +sputum production yellow-white.  Hyponatremia: Sodium decreased to 127 at visit 07/02/2015.  Drinks excessive water during the day.     Review of Systems  Constitutional: Positive for chills and fatigue. Negative for fever and diaphoresis.  HENT: Positive for congestion and sore throat. Negative for ear pain, postnasal drip, rhinorrhea, sinus pressure and trouble swallowing.   Respiratory: Positive for cough. Negative for shortness of breath and wheezing.   Cardiovascular: Positive for leg swelling. Negative for chest pain and palpitations.  Gastrointestinal: Negative for nausea, vomiting, abdominal pain, diarrhea and constipation.  Genitourinary: Positive for urgency. Negative for dysuria, frequency, hematuria and flank pain.  Musculoskeletal: Positive for back pain.  Skin: Positive for color change. Negative for rash and wound.  Neurological: Negative for dizziness, light-headedness and headaches.    Past Medical History  Diagnosis Date  . Hypertension     essential hypertension  . Tobacco abuse     smoking about 1/2 pk of cigarettes a day  . Anxiety   . Depression     psychiatrist Dr. Toy Care  . Respiratory infection   . Shortness of breath   . Malaise   . Lightheadedness   . Emphysematous COPD     changes in right base along with patchy areas on last x-ray  .  COPD (chronic obstructive pulmonary disease)   . History of echocardiogram 12/24/2006    Est. EF of 10-93% NORMAL LV SYSTOLIC FUNCTION WITH IMPAIRED RELAXATION -- MILD AORTIC SCLEROSIS -- NORMAL PALONARY ARTERY PRESSURE -- NO OLD ECHOS FOR COMPARISON -- [name], MD  . History of cardiovascular stress test 08/15/2004    EF of 70% -- Normal stress cardiolite.  There is no evidence of ischemia and there is normal LV function. -- [name]. MD  . Diastolic dysfunction   . Allergy   . Asthma   . Heart murmur   .  Pneumonia     "several times since May 2015" (07/06/2014)  . Chronic bronchitis     "get it alot; maybe not q yr" (07/06/2014)   Past Surgical History  Procedure Laterality Date  . Tubal ligation  1986   Allergies  Allergen Reactions  . Augmentin [Amoxicillin-Pot Clavulanate]     UNKNOWN  . Ceclor [Cefaclor]     hives  . Escitalopram Oxalate     Pt does not recall ever taking medication  . Penicillins     Rash------pt stated she CAN take Penicillin  . Sertraline Hcl     UNKNOWN  . Sulfa Drugs Cross Reactors     Hives and rash   History   Social History  . Marital Status: Married    Spouse Name: N/A  . Number of Children: N/A  . Years of Education: N/A   Occupational History  . SALES ASSOCIATE     [employer]   Social History Main Topics  . Smoking status: Former Smoker -- 1.00 packs/day for 42 years    Types: Cigarettes    Quit date: 05/26/2015  . Smokeless tobacco: Never Used  . Alcohol Use: No  . Drug Use: No  . Sexual Activity: No   Other Topics Concern  . Not on file   Social History Narrative       Objective:    BP 110/68 mmHg  Pulse 101  Temp(Src) 99 F (37.2 C) (Oral)  Resp 20  Ht '5\' 3"'$  (1.6 m)  Wt 144 lb (65.318 kg)  BMI 25.51 kg/m2  SpO2 94% Physical Exam  Constitutional: She is oriented to person, place, and time. She appears well-developed and well-nourished. No distress.  HENT:  Head: Normocephalic and atraumatic.  Right Ear: External ear normal.  Left Ear: External ear normal.  Nose: Nose normal.  Mouth/Throat: Oropharynx is clear and moist.  Eyes: Conjunctivae and EOM are normal. Pupils are equal, round, and reactive to light.  Neck: Normal range of motion. Neck supple. Carotid bruit is not present. No thyromegaly present.  Cardiovascular: Normal rate, regular rhythm, normal heart sounds and intact distal pulses.  Exam reveals no gallop and no friction rub.   No murmur heard. +venous stasis; +pitting edema BLE to proximal calves.    Pulmonary/Chest: Effort normal. No respiratory distress. She has no wheezes. She has rales.  Abdominal: Soft. Bowel sounds are normal. She exhibits no distension and no mass. There is no tenderness. There is no rebound and no guarding.  Musculoskeletal:  Diffuse erythema bilateral lower extremities on the distal aspect that climbs up her leg.  Lymphadenopathy:    She has no cervical adenopathy.  Neurological: She is alert and oriented to person, place, and time. No cranial nerve deficit.  Skin: Skin is warm and dry. No rash noted. She is not diaphoretic. There is erythema. No pallor.     Psychiatric: She has a normal mood  and affect. Her behavior is normal.   Results for orders placed or performed in visit on 07/05/15  POCT CBC  Result Value Ref Range   WBC 10.5 (A) 4.6 - 10.2 K/uL   Lymph, poc 3.0 0.6 - 3.4   POC LYMPH PERCENT 28.1 10 - 50 %L   MID (cbc) 0.4 0 - 0.9   POC MID % 3.5 0 - 12 %M   POC Granulocyte 7.2 (A) 2 - 6.9   Granulocyte percent 68.4 37 - 80 %G   RBC 4.18 4.04 - 5.48 M/uL   Hemoglobin 12.4 12.2 - 16.2 g/dL   HCT, POC 36.4 (A) 37.7 - 47.9 %   MCV 87.0 80 - 97 fL   MCH, POC 29.8 27 - 31.2 pg   MCHC 34.2 31.8 - 35.4 g/dL   RDW, POC 14.0 %   Platelet Count, POC 361 142 - 424 K/uL   MPV 6.9 0 - 99.8 fL   ROCEPHIN 1 GRAM IM ADMINISTERED.    Assessment & Plan:   1. Klebsiella cystitis   2. Cellulitis of right lower extremity   3. Hyponatremia   4. Bilateral low back pain without sciatica   5. Venous insufficiency (chronic) (peripheral)   6. Bronchiectasis with acute exacerbation     1. Klebsiella cystitis: Improving but persistent urgency; send repeat urine culture; s/p Rocephin 1 gram IM again.  Continue Keflex. 2.  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  S/p repeat Rocephin injection today.  Rx for Doxycycline provided; continue Keflex. 3.  Bronchiectasis with acute exacerbation:  acute worsening cough; rx for Doxycycline provided; continue Mucinex bid. Followed by pulmonology. 4.  Hyponatremia: chronic with mild worsening; asymptomatic.  Repeat today.  Recommend gatorade instead of water during the dya. 5. Chronic venous insufficiency: worsening; non-compliance with compression hose; now with secondary cellulitis.  6. Low back pain/DDD lumbar: worsening; rx for Zanaflex provided to use qid; continue hydrocodone sparingly; refer to ortho due to increased frequency and worsening chronic back pain.   Meds ordered this encounter  Medications  . cefTRIAXone (ROCEPHIN) injection 1 g    Sig:     Order Specific Question:  Antibiotic Indication:    Answer:  Cellulitis  . doxycycline (VIBRAMYCIN) 100 MG capsule    Sig: Take 1 capsule (100 mg total) by mouth 2 (two) times daily.    Dispense:  20 capsule    Refill:  0  . tiZANidine (ZANAFLEX) 4 MG tablet    Sig: Take 1 tablet (4 mg total) by mouth every 6 (six) hours as needed for muscle spasms.    Dispense:  40 tablet    Refill:  0    No Follow-up on file.   I personally performed the services described in this documentation, which was scribed in my presence. The recorded information has been reviewed and considered.  Kristi Elayne Guerin, M.D. [facility] [address] [place], [place]  [address] [phone] phone [phone] fax

## 2015-07-06 LAB — COMPREHENSIVE METABOLIC PANEL
ALK PHOS: 131 U/L — AB (ref 33–130)
ALT: 17 U/L (ref 6–29)
AST: 26 U/L (ref 10–35)
Albumin: 3.4 g/dL — ABNORMAL LOW (ref 3.6–5.1)
BILIRUBIN TOTAL: 0.4 mg/dL (ref 0.2–1.2)
BUN: 8 mg/dL (ref 7–25)
CALCIUM: 8.8 mg/dL (ref 8.6–10.4)
CO2: 24 mmol/L (ref 20–31)
Chloride: 90 mmol/L — ABNORMAL LOW (ref 98–110)
Creat: 0.46 mg/dL — ABNORMAL LOW (ref 0.50–0.99)
GLUCOSE: 146 mg/dL — AB (ref 65–99)
Potassium: 3.3 mmol/L — ABNORMAL LOW (ref 3.5–5.3)
Sodium: 130 mmol/L — ABNORMAL LOW (ref 135–146)
Total Protein: 7.3 g/dL (ref 6.1–8.1)

## 2015-07-07 LAB — URINE CULTURE
COLONY COUNT: NO GROWTH
ORGANISM ID, BACTERIA: NO GROWTH

## 2015-07-08 ENCOUNTER — Emergency Department (HOSPITAL_COMMUNITY)
Admission: EM | Admit: 2015-07-08 | Discharge: 2015-07-08 | Disposition: A | Discharge status: Home / Self Care | Payer: Worker's Compensation | Attending: Emergency Medicine | Admitting: Emergency Medicine

## 2015-07-08 ENCOUNTER — Emergency Department (HOSPITAL_COMMUNITY): Payer: Worker's Compensation

## 2015-07-08 ENCOUNTER — Encounter (HOSPITAL_COMMUNITY): Payer: Self-pay | Admitting: Emergency Medicine

## 2015-07-08 DIAGNOSIS — M79642 Pain in left hand: Secondary | ICD-10-CM

## 2015-07-08 DIAGNOSIS — Z87891 Personal history of nicotine dependence: Secondary | ICD-10-CM | POA: Insufficient documentation

## 2015-07-08 DIAGNOSIS — S8992XA Unspecified injury of left lower leg, initial encounter: Secondary | ICD-10-CM | POA: Diagnosis not present

## 2015-07-08 DIAGNOSIS — Z8701 Personal history of pneumonia (recurrent): Secondary | ICD-10-CM | POA: Diagnosis not present

## 2015-07-08 DIAGNOSIS — Y929 Unspecified place or not applicable: Secondary | ICD-10-CM | POA: Insufficient documentation

## 2015-07-08 DIAGNOSIS — F329 Major depressive disorder, single episode, unspecified: Secondary | ICD-10-CM | POA: Insufficient documentation

## 2015-07-08 DIAGNOSIS — J449 Chronic obstructive pulmonary disease, unspecified: Secondary | ICD-10-CM | POA: Insufficient documentation

## 2015-07-08 DIAGNOSIS — I10 Essential (primary) hypertension: Secondary | ICD-10-CM | POA: Insufficient documentation

## 2015-07-08 DIAGNOSIS — G8929 Other chronic pain: Secondary | ICD-10-CM | POA: Insufficient documentation

## 2015-07-08 DIAGNOSIS — Z880 Allergy status to penicillin: Secondary | ICD-10-CM | POA: Insufficient documentation

## 2015-07-08 DIAGNOSIS — G44309 Post-traumatic headache, unspecified, not intractable: Secondary | ICD-10-CM

## 2015-07-08 DIAGNOSIS — Y939 Activity, unspecified: Secondary | ICD-10-CM | POA: Insufficient documentation

## 2015-07-08 DIAGNOSIS — Z79899 Other long term (current) drug therapy: Secondary | ICD-10-CM | POA: Insufficient documentation

## 2015-07-08 DIAGNOSIS — Z7951 Long term (current) use of inhaled steroids: Secondary | ICD-10-CM | POA: Diagnosis not present

## 2015-07-08 DIAGNOSIS — Z791 Long term (current) use of non-steroidal anti-inflammatories (NSAID): Secondary | ICD-10-CM | POA: Diagnosis not present

## 2015-07-08 DIAGNOSIS — Y990 Civilian activity done for income or pay: Secondary | ICD-10-CM | POA: Insufficient documentation

## 2015-07-08 DIAGNOSIS — F419 Anxiety disorder, unspecified: Secondary | ICD-10-CM | POA: Diagnosis not present

## 2015-07-08 DIAGNOSIS — S6992XA Unspecified injury of left wrist, hand and finger(s), initial encounter: Secondary | ICD-10-CM | POA: Insufficient documentation

## 2015-07-08 DIAGNOSIS — W19XXXA Unspecified fall, initial encounter: Secondary | ICD-10-CM

## 2015-07-08 DIAGNOSIS — Z792 Long term (current) use of antibiotics: Secondary | ICD-10-CM | POA: Insufficient documentation

## 2015-07-08 DIAGNOSIS — R011 Cardiac murmur, unspecified: Secondary | ICD-10-CM | POA: Diagnosis not present

## 2015-07-08 DIAGNOSIS — Z23 Encounter for immunization: Secondary | ICD-10-CM | POA: Diagnosis not present

## 2015-07-08 DIAGNOSIS — S79911A Unspecified injury of right hip, initial encounter: Secondary | ICD-10-CM | POA: Insufficient documentation

## 2015-07-08 DIAGNOSIS — W01198A Fall on same level from slipping, tripping and stumbling with subsequent striking against other object, initial encounter: Secondary | ICD-10-CM | POA: Diagnosis not present

## 2015-07-08 DIAGNOSIS — S61502A Unspecified open wound of left wrist, initial encounter: Secondary | ICD-10-CM | POA: Diagnosis not present

## 2015-07-08 MED ORDER — TETANUS-DIPHTH-ACELL PERTUSSIS 5-2.5-18.5 LF-MCG/0.5 IM SUSP
0.5000 mL | Freq: Once | INTRAMUSCULAR | Status: AC
  Administered 2015-07-08: 0.5 mL via INTRAMUSCULAR
  Filled 2015-07-08: qty 0.5

## 2015-07-08 NOTE — ED Notes (Signed)
Per EMS: pt from home for eval of fall at work, pt states she thinks she hit her head, denies any LOC. Pt reports left wrist pain and left hip pain, skin tear noted to left wrist. No shortening or rotation noted to left leg. nad noted. Pt answering questions appropriately.

## 2015-07-08 NOTE — Discharge Instructions (Signed)
1. Medications: usual home medications 2. Treatment: rest, drink plenty of fluids,  3. Follow Up: Please followup with your primary doctor in 2 days for discussion of your diagnoses and further evaluation after today's visit; if you do not have a primary care doctor use the resource guide provided to find one; Please return to the ER for worsening symptoms, signs of worsening infection in your foot, altered mental status or other concerns

## 2015-07-08 NOTE — ED Provider Notes (Signed)
CSN: [id number]     Arrival date & time 07/08/15  1612 History   First MD Initiated Contact with Patient 07/08/15 1617     Chief Complaint  Patient presents with  . Fall  . Wrist Pain  . Hip Pain     (Consider location/radiation/quality/duration/timing/severity/associated sxs/prior Treatment) Patient is a 64 y.o. female presenting with fall, wrist pain, and hip pain. The history is provided by the patient, medical records, the EMS personnel and the spouse. No language interpreter was used.  Fall Associated symptoms include arthralgias (left hand) and headaches (resolved). Pertinent negatives include no abdominal pain, chest pain, chills, coughing, fever, joint swelling, nausea, neck pain, numbness, rash (left wrise), vomiting or weakness.  Wrist Pain Associated symptoms include arthralgias (left hand) and headaches (resolved). Pertinent negatives include no abdominal pain, chest pain, chills, coughing, fever, joint swelling, nausea, neck pain, numbness, rash (left wrise), vomiting or weakness.  Hip Pain Associated symptoms include arthralgias (left hand) and headaches (resolved). Pertinent negatives include no abdominal pain, chest pain, chills, coughing, fever, joint swelling, nausea, neck pain, numbness, rash (left wrise), vomiting or weakness.     PEYTON ROSSNER is a 64 y.o. female  with a hx of HTN, anxiety, depression, COPD, asthma presents to the Emergency Department complaining of acute left hand pain with persistence and right sided headache, now resolved after fall approx 1 hour PTA.  Pt reports she tripped on something in the floor and fell, hitting her head.  She denies LOC, vision changes, neck pain, chest pain, back pain, numbness, weakness, saddle anesthesia, gait disturbance.  Pt reports he bumped her hip as well, but has been able to ambulate without difficulty.  She takes Vicodin and zanaflex at home for chronic pain.  Pt reports she was ablt to walk after the fall.  She reports  that she is clumsy and often stumbles causing her to fall.  Associated symptoms include small skin tear to the left wrise.  Nothing makes it better and nothing makes it worse.  Patient takes aspirin daily but denies other blood thinners.  Patient also reports persistent peripheral edema. She reports that she is currently being treated for a cellulitis of the left lower leg. Both patient and family report that this is improving. Patient saw her primary care yesterday for follow-up and reports her primary care physician was comfortable with the improvement made.  Past Medical History  Diagnosis Date  . Hypertension     essential hypertension  . Tobacco abuse     smoking about 1/2 pk of cigarettes a day  . Anxiety   . Depression     psychiatrist Dr. Toy Care  . Respiratory infection   . Shortness of breath   . Malaise   . Lightheadedness   . Emphysematous COPD     changes in right base along with patchy areas on last x-ray  . COPD (chronic obstructive pulmonary disease)   . History of echocardiogram 12/24/2006    Est. EF of 08-14% NORMAL LV SYSTOLIC FUNCTION WITH IMPAIRED RELAXATION -- MILD AORTIC SCLEROSIS -- NORMAL PALONARY ARTERY PRESSURE -- NO OLD ECHOS FOR COMPARISON -- [name], MD  . History of cardiovascular stress test 08/15/2004    EF of 70% -- Normal stress cardiolite.  There is no evidence of ischemia and there is normal LV function. -- [name]. MD  . Diastolic dysfunction   . Allergy   . Asthma   . Heart murmur   . Pneumonia     "  several times since May 2015" (07/06/2014)  . Chronic bronchitis     "get it alot; maybe not q yr" (07/06/2014)   Past Surgical History  Procedure Laterality Date  . Tubal ligation  1986   Family History  Problem Relation Age of Onset  . Heart disease Mother   . Stroke Mother   . Heart disease Father   . Hyperlipidemia Father   . Hypertension Father   . Cancer Maternal Aunt    History  Substance Use Topics  . Smoking  status: Former Smoker -- 1.00 packs/day for 42 years    Types: Cigarettes    Quit date: 05/26/2015  . Smokeless tobacco: Never Used  . Alcohol Use: No   OB History    No data available     Review of Systems  Constitutional: Negative for fever and chills.  HENT: Negative for dental problem, facial swelling and nosebleeds.   Eyes: Negative for visual disturbance.  Respiratory: Negative for cough, chest tightness, shortness of breath, wheezing and stridor.   Cardiovascular: Negative for chest pain.  Gastrointestinal: Negative for nausea, vomiting and abdominal pain.  Genitourinary: Negative for dysuria, hematuria and flank pain.  Musculoskeletal: Positive for arthralgias (left hand). Negative for back pain, joint swelling, gait problem, neck pain and neck stiffness.  Skin: Positive for wound (left wrist). Negative for rash (left wrise).  Neurological: Positive for headaches (resolved). Negative for syncope, weakness, light-headedness and numbness.  Hematological: Does not bruise/bleed easily.  Psychiatric/Behavioral: The patient is not nervous/anxious.   All other systems reviewed and are negative.     Allergies  Augmentin; Ceclor; Escitalopram oxalate; Penicillins; Sertraline hcl; and Sulfa drugs cross reactors  Home Medications   Prior to Admission medications   Medication Sig Start Date End Date Taking? Authorizing Provider  albuterol (PROVENTIL HFA;VENTOLIN HFA) 108 (90 BASE) MCG/ACT inhaler Inhale 2 puffs into the lungs every 6 (six) hours as needed for wheezing or shortness of breath. 04/16/15   Brand Males, MD  albuterol (PROVENTIL) (2.5 MG/3ML) 0.083% nebulizer solution Take 3 mLs (2.5 mg total) by nebulization every 3 (three) hours as needed for wheezing or shortness of breath. 03/14/15   Brand Males, MD  ALPRAZolam Duanne Moron) 0.5 MG tablet Take 0.5 mg by mouth 2 (two) times daily.    Historical Provider, MD  benzonatate (TESSALON) 100 MG capsule TAKE 1 TO 2 CAPSULES  BY MOUTH 3 TIMES A DAY AS NEEDED FOR COUGH 06/27/15   Wardell Honour, MD  Calcium Carbonate-Vitamin D (CALCIUM-VITAMIN D) 500-200 MG-UNIT per tablet Take 1 tablet by mouth daily.    Historical Provider, MD  cephALEXin (KEFLEX) 500 MG capsule Take 1 capsule (500 mg total) by mouth 4 (four) times daily. 07/02/15   Shawnee Knapp, MD  divalproex (DEPAKOTE ER) 500 MG 24 hr tablet Take 500 mg by mouth at bedtime.  02/22/12   Historical Provider, MD  doxycycline (VIBRAMYCIN) 100 MG capsule Take 1 capsule (100 mg total) by mouth 2 (two) times daily. 07/05/15   Wardell Honour, MD  fluocinonide ointment (LIDEX) 0.05 % as needed.  03/15/15   Historical Provider, MD  fluticasone (FLONASE) 50 MCG/ACT nasal spray Place 2 sprays into both nostrils daily. 06/06/15   Brand Males, MD  furosemide (LASIX) 20 MG tablet TAKE 2 TABLETS BY MOUTH EVERY DAY 05/25/15   [name], MD  guaiFENesin (MUCINEX) 600 MG 12 hr tablet Take 600 mg by mouth 2 (two) times daily as needed.    Historical Provider, MD  HYDROcodone-acetaminophen (  NORCO/VICODIN) 5-325 MG per tablet Take 1 tablet by mouth every 6 (six) hours as needed for moderate pain. 07/02/15   Shawnee Knapp, MD  ipratropium (ATROVENT) 0.03 % nasal spray Place 2 sprays into the nose 2 (two) times daily. Patient taking differently: Place 2 sprays into the nose as needed.  04/19/15   Wardell Honour, MD  lidocaine (LIDODERM) 5 % Place 1 patch onto the skin daily. Remove & Discard patch within 12 hours or as directed by MD 07/02/15   Shawnee Knapp, MD  losartan-hydrochlorothiazide (HYZAAR) 50-12.5 MG per tablet TAKE 0.5 TABLETS BY MOUTH DAILY. 05/25/15   [name], MD  Multiple Vitamin (MULTIVITAMIN) tablet Take 1 tablet by mouth daily.    Historical Provider, MD  nadolol (CORGARD) 40 MG tablet TAKE 1/2 TABLET BY MOUTH EVERY DAY 04/25/15   [name], MD  nortriptyline (PAMELOR) 50 MG capsule Take 100 mg by mouth at bedtime.    Historical Provider, MD  senna (SENOKOT) 8.6 MG TABS  tablet Take 2 tablets (17.2 mg total) by mouth at bedtime. 07/14/14   Erick Colace, NP  tiotropium (SPIRIVA) 18 MCG inhalation capsule Place 1 capsule (18 mcg total) into inhaler and inhale daily. 12/13/14   Brand Males, MD  tiZANidine (ZANAFLEX) 4 MG tablet Take 1 tablet (4 mg total) by mouth every 6 (six) hours as needed for muscle spasms. 07/05/15   Wardell Honour, MD   BP 106/47 mmHg  Pulse 88  Resp 21  SpO2 96% Physical Exam  Constitutional: She is oriented to person, place, and time. She appears well-developed and well-nourished. No distress.  HENT:  Head: Normocephalic and atraumatic.  Nose: Nose normal.  Mouth/Throat: Uvula is midline, oropharynx is clear and moist and mucous membranes are normal.  Eyes: Conjunctivae and EOM are normal. Pupils are equal, round, and reactive to light.  Neck: No spinous process tenderness and no muscular tenderness present. No rigidity. Normal range of motion present.  Full ROM without pain No midline cervical tenderness No crepitus, deformity or step-offs No paraspinal tenderness  Cardiovascular: Normal rate, regular rhythm and intact distal pulses.   Pulses:      Radial pulses are 2+ on the right side, and 2+ on the left side.       Dorsalis pedis pulses are 2+ on the right side, and 2+ on the left side.       Posterior tibial pulses are 2+ on the right side, and 2+ on the left side.  Pulmonary/Chest: Effort normal and breath sounds normal. No accessory muscle usage. No respiratory distress. She has no decreased breath sounds. She has no wheezes. She has no rhonchi. She has no rales. She exhibits no tenderness and no bony tenderness.  No contusion No flail segment, crepitus or deformity Equal chest expansion  Abdominal: Soft. Normal appearance and bowel sounds are normal. There is no tenderness. There is no rigidity, no guarding and no CVA tenderness.  No contusion Abd soft and nontender  Musculoskeletal: Normal range of motion. She  exhibits edema.       Thoracic back: She exhibits normal range of motion.       Lumbar back: She exhibits normal range of motion.  Full range of motion of the T-spine and L-spine No tenderness to palpation of the spinous processes of the T-spine or L-spine No crepitus, deformity or step-offs no tenderness to palpation of the paraspinous muscles of the L-spine Full range of motion of all fingers of the left  hand and left wrist, mild tenderness to palpation over the thenar eminence of the left 2+ pitting edema bilaterally  Lymphadenopathy:    She has no cervical adenopathy.  Neurological: She is alert and oriented to person, place, and time. She has normal reflexes. No cranial nerve deficit. GCS eye subscore is 4. GCS verbal subscore is 5. GCS motor subscore is 6.  Reflex Scores:      Bicep reflexes are 2+ on the right side and 2+ on the left side.      Brachioradialis reflexes are 2+ on the right side and 2+ on the left side.      Patellar reflexes are 2+ on the right side and 2+ on the left side.      Achilles reflexes are 2+ on the right side and 2+ on the left side. Speech is clear and goal oriented, follows commands Normal 5/5 strength in upper and lower extremities bilaterally including dorsiflexion and plantar flexion, strong and equal grip strength Sensation normal to light and sharp touch Moves extremities without ataxia, coordination intact Normal gait and balance No Clonus  Skin: Skin is warm and dry. No rash noted. She is not diaphoretic. There is erythema.  Mild erythema of the left lower leg on the dorsum of the foot and up just above the ankle; no induration or evidence of abscess  Psychiatric: She has a normal mood and affect.  Nursing note and vitals reviewed.   ED Course  Procedures (including critical care time) Labs Review Labs Reviewed - No data to display  Imaging Review Ct Head Wo Contrast  07/08/2015   CLINICAL DATA:  Patient status post fall at work. Struck  the right side of her head. No loss of consciousness. Initial encounter.  EXAM: CT HEAD WITHOUT CONTRAST  TECHNIQUE: Contiguous axial images were obtained from the base of the skull through the vertex without intravenous contrast.  COMPARISON:  Brain CT 07/06/2014  FINDINGS: Ventricles and sulci are appropriate for patient's age. No evidence for acute cortically based infarct, intracranial hemorrhage, mass lesion or mass effect. Orbits are unremarkable. Mixed attenuation chronic opacification of the bilateral maxillary sinuses. Mastoid air cells are well aerated. Calvarium is intact.  IMPRESSION: No acute intracranial process.  Chronic maxillary sinusitis.   Electronically Signed   By: Lovey Newcomer M.D.   On: 07/08/2015 17:36   Dg Hand Complete Left  07/08/2015   CLINICAL DATA:  Per EMS: pt from home for eval of fall at work, pt states she thinks she hit her head, denies any LOC. Pt reports left wrist pain and left hip pain, skin tear noted to left wrist on posterior side.  EXAM: LEFT HAND - COMPLETE 3+ VIEW  COMPARISON:  None.  FINDINGS: There is no evidence of fracture or dislocation. There is no evidence of arthropathy or other focal bone abnormality. Soft tissues are unremarkable.  IMPRESSION: Negative.   Electronically Signed   By: Lajean Manes M.D.   On: 07/08/2015 17:18     EKG Interpretation None      MDM   Final diagnoses:  Fall, initial encounter  Left hand pain  Post-traumatic headache, not intractable, unspecified chronicity pattern   [address] presents after mechanical fall. She reports hitting her head but without loss of consciousness. Denies neurologic deficits. Normal neurologic exam here in the emergency department. Patient is able to bear weight on the right hip. Full range of motion of the right hip and I doubt fracture. Pain to palpation of  the left thenar eminence will image. Patient also reports hitting her head however her headache has resolved. She takes aspirin but no  other anticoagulate. Will obtain CT head.  Patient with evidence of improving cellulitis of the left lower leg. Patient and family report that it is improved from previous. She's without tachycardia or hypotension. Strict return precautions given if erythema worsens or patient develops fever.  6:23 PM Patient remains neurologically intact, ambulatory without assistance here in the emergency department. Head CT and left hand films are without evidence of acute abnormality or fracture. Patient will be discharged home. She has muscle relaxers and pain control at home given to her by her primary care physician. Will not prescribe additional at this time. Patient will appearing. Patient and family comfortable with the plan. Strict return precautions given.   BP 106/47 mmHg  Pulse 88  Resp 21  SpO2 96%   Abigail Butts, PA-C 07/08/15 1824  Virgel Manifold, MD 07/08/15 2055

## 2015-07-10 ENCOUNTER — Ambulatory Visit (INDEPENDENT_AMBULATORY_CARE_PROVIDER_SITE_OTHER): Payer: Worker's Compensation | Admitting: Family Medicine

## 2015-07-10 ENCOUNTER — Encounter: Payer: Self-pay | Admitting: Family Medicine

## 2015-07-10 ENCOUNTER — Ambulatory Visit: Payer: Worker's Compensation

## 2015-07-10 VITALS — BP 95/61 | HR 100 | Temp 98.7°F | Resp 18 | Ht 63.0 in | Wt 141.8 lb

## 2015-07-10 DIAGNOSIS — M25551 Pain in right hip: Secondary | ICD-10-CM | POA: Diagnosis not present

## 2015-07-10 DIAGNOSIS — S7001XA Contusion of right hip, initial encounter: Secondary | ICD-10-CM

## 2015-07-10 DIAGNOSIS — W19XXXA Unspecified fall, initial encounter: Secondary | ICD-10-CM | POA: Diagnosis not present

## 2015-07-10 MED ORDER — POTASSIUM CHLORIDE CRYS ER 20 MEQ PO TBCR: 20.0000 meq | EXTENDED_RELEASE_TABLET | Freq: Every day | ORAL | Status: DC

## 2015-07-10 NOTE — Addendum Note (Signed)
Addended by: Wardell Honour on: 07/10/2015 10:29 PM   Modules accepted: Orders

## 2015-07-10 NOTE — Progress Notes (Signed)
MRN: [id number] DOB: 17-Oct-1951  Subjective:   Catherine Kelley is a 64 y.o. female presenting for chief complaint of workers comp  Reports 2 day history of fall while at work. Patient was initially seen same day of fall at [facility] on 07/08/2015. Today, she reports that she has had worsening pain of her right hip since her fall, aggravated by walking. Also reports bruising over her left forearm. Denies loss of consciousness, confusion, disorientation, double vision. Denies falling again, difficulty with her balance, decrease strength or weakness. Denies any other aggravating or relieving factors, no other questions or concerns.  Catherine Kelley's medications list, allergies, pmh and psh were reviewed and excluded from this note due to being a worker's comp case.  ROS As in subjective.  Objective:   Vitals: BP 95/61 mmHg  Pulse 100  Temp(Src) 98.7 F (37.1 C) (Oral)  Resp 18  Ht '5\' 3"'$  (1.6 m)  Wt 141 lb 12.8 oz (64.32 kg)  BMI 25.13 kg/m2  SpO2 95%  Physical Exam  Constitutional: She is oriented to person, place, and time. She appears well-developed and well-nourished.  Cardiovascular: Normal rate.   Pulmonary/Chest: Effort normal.  Neurological: She is alert and oriented to person, place, and time. No cranial nerve deficit. Coordination (favoring right hip) abnormal.  Skin: Skin is warm and dry. No rash noted. No erythema. No pallor.  Areas of ecchymosis over forearms, lower legs bilaterally; one area is over anterior left forearm and is related to fall per patient.  Psychiatric: She has a normal mood and affect.   [facility] reading (PRIMARY) by  Dr. Marin Comment and PA-Teofila Bowery. Right hip - no evidence of fracture or dislocation.  Ct Head Wo Contrast  07/08/2015   CLINICAL DATA:  Patient status post fall at work. Struck the right side of her head. No loss of consciousness. Initial encounter.  EXAM: CT HEAD WITHOUT CONTRAST  TECHNIQUE: Contiguous axial images were obtained from the base of the skull  through the vertex without intravenous contrast.  COMPARISON:  Brain CT 07/06/2014  FINDINGS: Ventricles and sulci are appropriate for patient's age. No evidence for acute cortically based infarct, intracranial hemorrhage, mass lesion or mass effect. Orbits are unremarkable. Mixed attenuation chronic opacification of the bilateral maxillary sinuses. Mastoid air cells are well aerated. Calvarium is intact.  IMPRESSION: No acute intracranial process.  Chronic maxillary sinusitis.   Electronically Signed   By: Lovey Newcomer M.D.   On: 07/08/2015 17:36   Dg Hand Complete Left  07/08/2015   CLINICAL DATA:  Per EMS: pt from home for eval of fall at work, pt states she thinks she hit her head, denies any LOC. Pt reports left wrist pain and left hip pain, skin tear noted to left wrist on posterior side.  EXAM: LEFT HAND - COMPLETE 3+ VIEW  COMPARISON:  None.  FINDINGS: There is no evidence of fracture or dislocation. There is no evidence of arthropathy or other focal bone abnormality. Soft tissues are unremarkable.  IMPRESSION: Negative.   Electronically Signed   By: Lajean Manes M.D.   On: 07/08/2015 17:18   Dg Hip Unilat W Or W/o Pelvis 2-3 Views Right  07/10/2015   CLINICAL DATA:  RIGHT hip pain.  Fall at work.  Initial encounter.  EXAM: DG HIP (WITH OR WITHOUT PELVIS) 2-3V RIGHT  COMPARISON:  04/29/2015.  FINDINGS: Mild RIGHT hip osteoarthritis. The pelvic rings appear intact. Severe lower lumbar spondylosis. Sacral arcades appear within normal limits. The RIGHT hip is  externally rotated when compared to the LEFT.  IMPRESSION: Mild RIGHT hip osteoarthritis.  No acute osseous abnormality.   Electronically Signed   By: Dereck Ligas M.D.   On: 07/10/2015 14:18   Assessment and Plan :   1. Fall, initial encounter 2. Right hip pain 3. Contusion of right hip, initial encounter - Stable, advised patient to continue using her home supply of pain medication. She may also use hot and cold packs, menthol topical. RTC  in 2 weeks if no improvement in hip pain.  Jaynee Eagles, PA-C [facility] [phone] 07/10/2015 1:31 PM

## 2015-07-10 NOTE — Patient Instructions (Signed)
Contusion °A contusion is a deep bruise. Contusions are the result of an injury that caused bleeding under the skin. The contusion may turn blue, purple, or yellow. Minor injuries will give you a painless contusion, but more severe contusions may stay painful and swollen for a few weeks.  °CAUSES  °A contusion is usually caused by a blow, trauma, or direct force to an area of the body. °SYMPTOMS  °· Swelling and redness of the injured area. °· Bruising of the injured area. °· Tenderness and soreness of the injured area. °· Pain. °DIAGNOSIS  °The diagnosis can be made by taking a history and physical exam. An X-ray, CT scan, or MRI may be needed to determine if there were any associated injuries, such as fractures. °TREATMENT  °Specific treatment will depend on what area of the body was injured. In general, the best treatment for a contusion is resting, icing, elevating, and applying cold compresses to the injured area. Over-the-counter medicines may also be recommended for pain control. Ask your caregiver what the best treatment is for your contusion. °HOME CARE INSTRUCTIONS  °· Put ice on the injured area. °¨ Put ice in a plastic bag. °¨ Place a towel between your skin and the bag. °¨ Leave the ice on for 15-20 minutes, 3-4 times a day, or as directed by your health care provider. °· Only take over-the-counter or prescription medicines for pain, discomfort, or fever as directed by your caregiver. Your caregiver may recommend avoiding anti-inflammatory medicines (aspirin, ibuprofen, and naproxen) for 48 hours because these medicines may increase bruising. °· Rest the injured area. °· If possible, elevate the injured area to reduce swelling. °SEEK IMMEDIATE MEDICAL CARE IF:  °· You have increased bruising or swelling. °· You have pain that is getting worse. °· Your swelling or pain is not relieved with medicines. °MAKE SURE YOU:  °· Understand these instructions. °· Will watch your condition. °· Will get help right  away if you are not doing well or get worse. °Document Released: 08/27/2005 Document Revised: 11/22/2013 Document Reviewed: 09/22/2011 °[employer]® Patient Information ©2015 [employer]. This information is not intended to replace advice given to you by your health care provider. Make sure you discuss any questions you have with your health care provider. ° °

## 2015-07-11 ENCOUNTER — Encounter: Payer: Self-pay | Admitting: Family Medicine

## 2015-07-11 IMAGING — CT CT CHEST W/O CM
2 of 3 series · 15 of 36 positions shown, 18 images · non-contrast
Comparison: 07/06/2014

CLINICAL DATA: Shortness of breath. Pneumonia. Right lung
cavitation.

EXAM:
CT CHEST WITHOUT CONTRAST
TECHNIQUE: Multidetector CT imaging of the chest was performed following the
standard protocol without IV contrast..

[Series 2: thorax 5.0 i31f 1 · axial · 0.67mm/px · z∈[-622,-382]mm · 12 of 58 slices shown, 15 images]
[im 5/58  mediastinal]
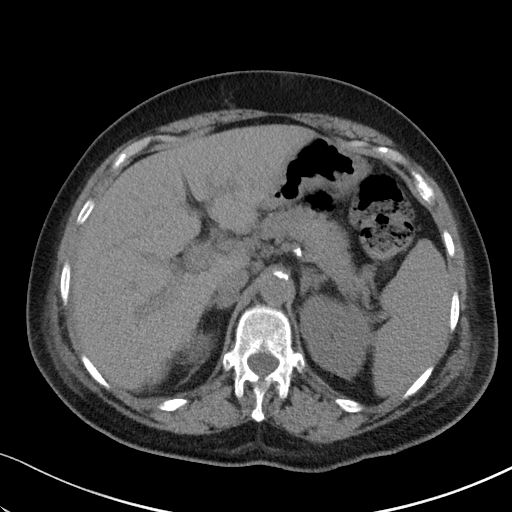
[im 5/58  lung]
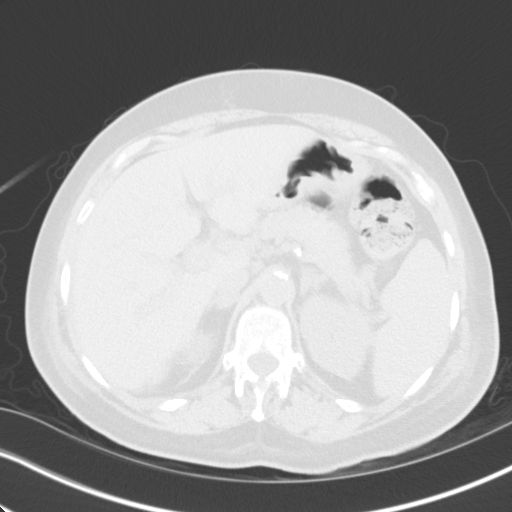
[im 9/58  lung]
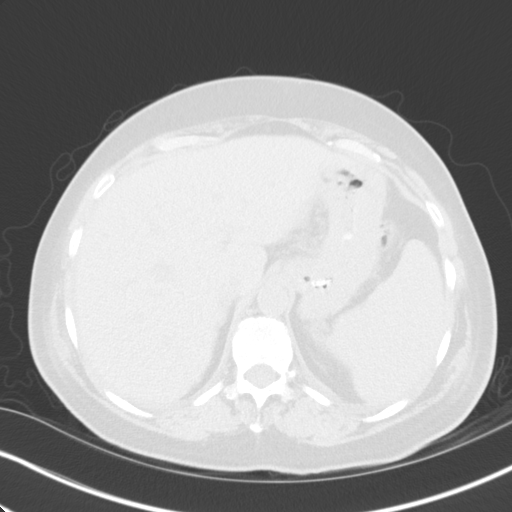
[im 13/58  lung]
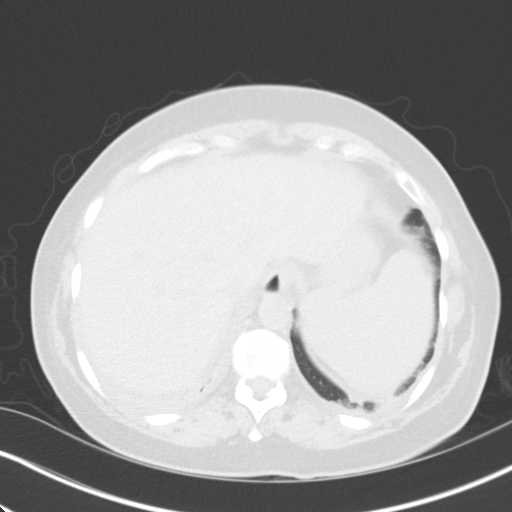
[im 17/58  lung]
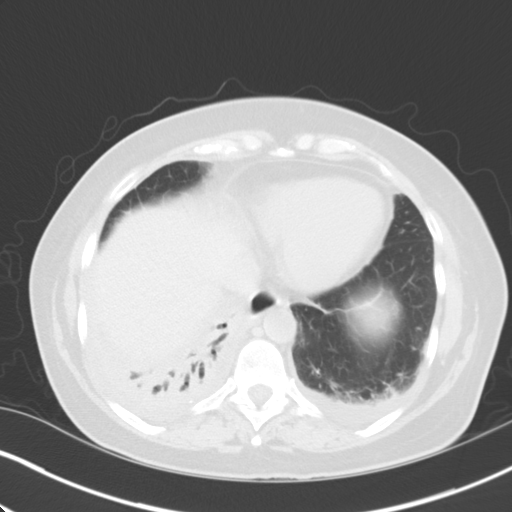
[im 22/58  mediastinal]
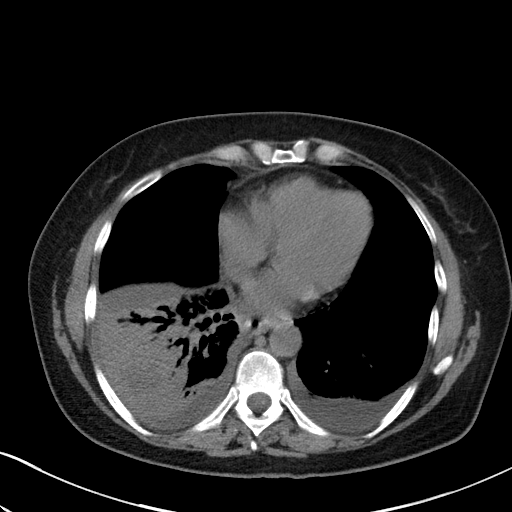
[im 22/58  lung]
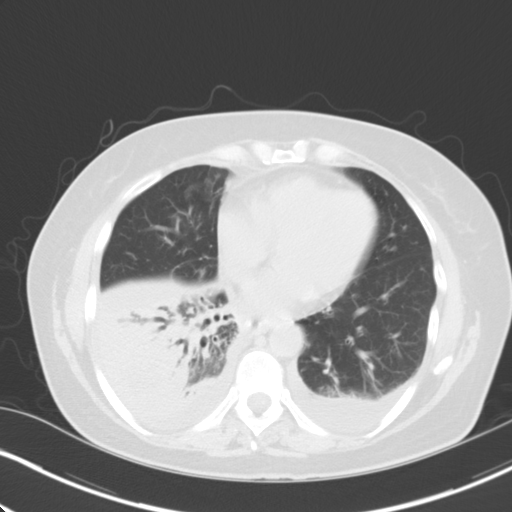
[im 26/58  lung]
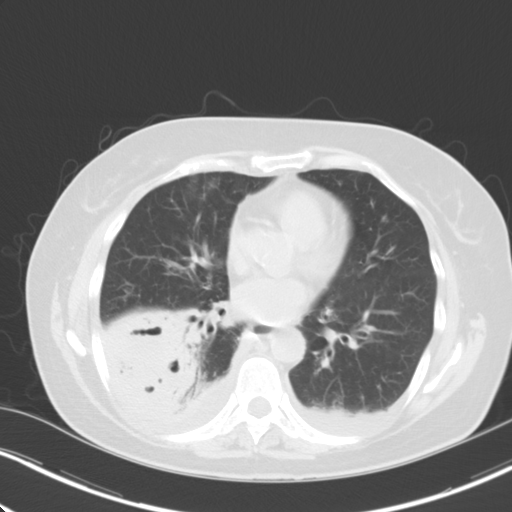
[im 32/58  lung]
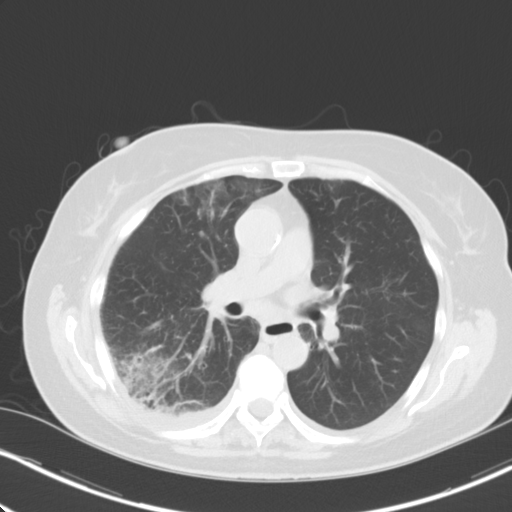
[im 36/58  lung]
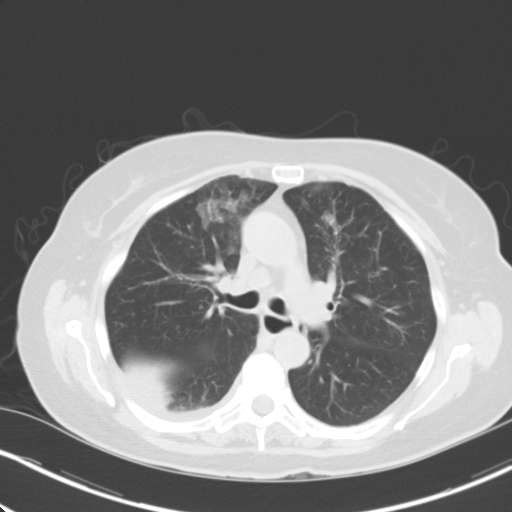
[im 41/58  mediastinal]
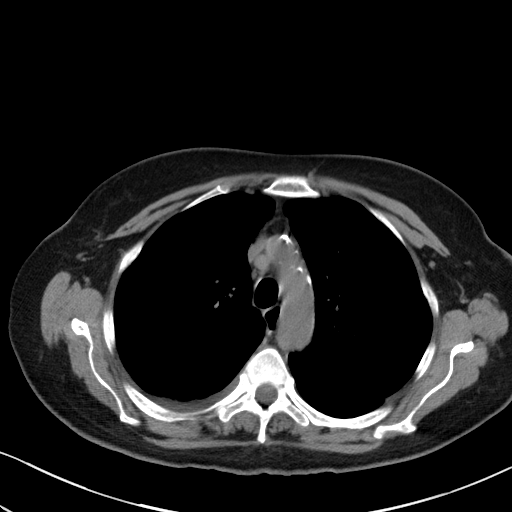
[im 41/58  lung]
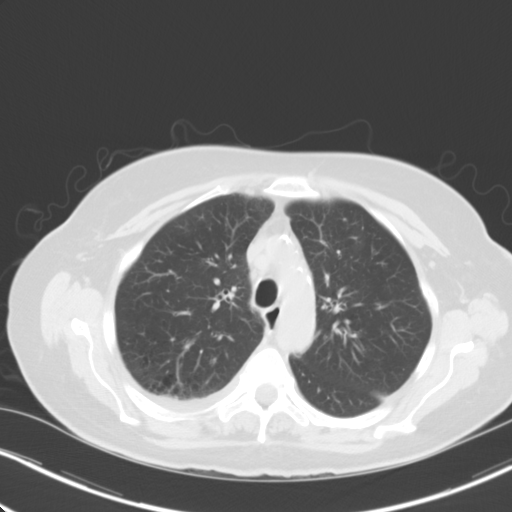
[im 45/58  lung]
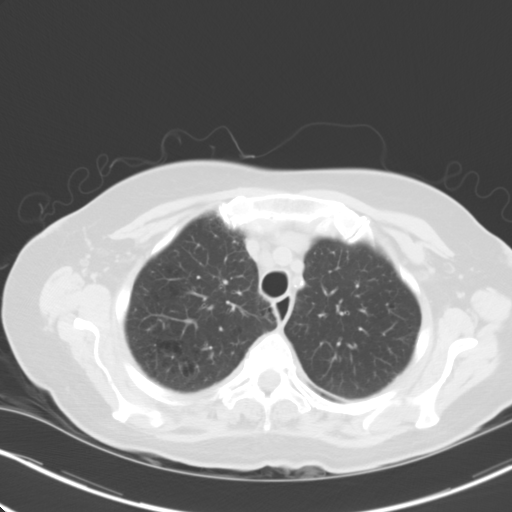
[im 49/58  lung]
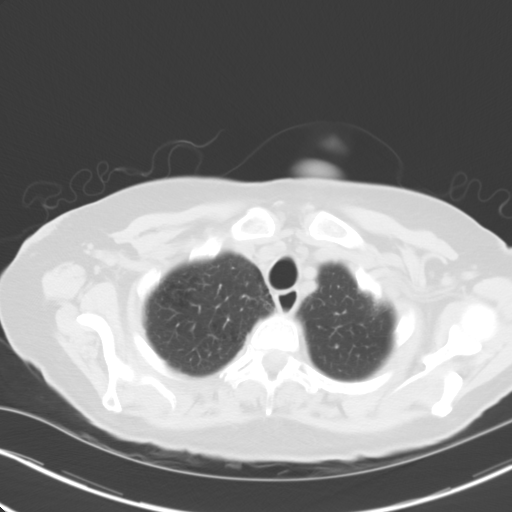
[im 53/58  lung]
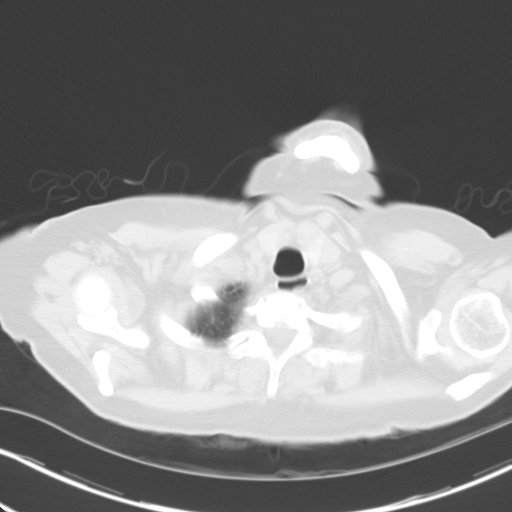

[Series 5: coronal · coronal · 0.59mm/px · 3 of 79 slices shown]
[im 16/79  lung]
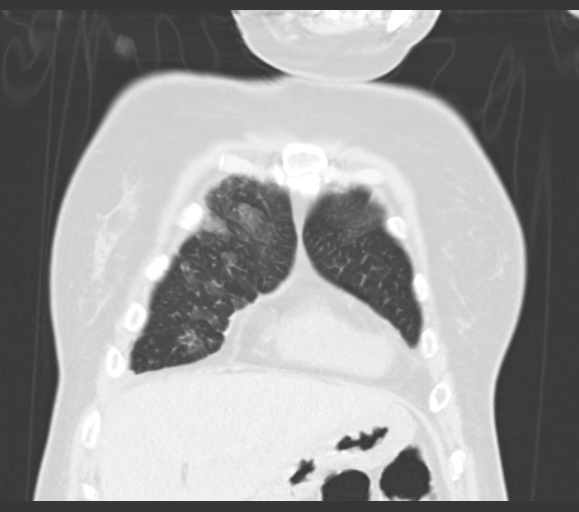
[im 32/79  lung]
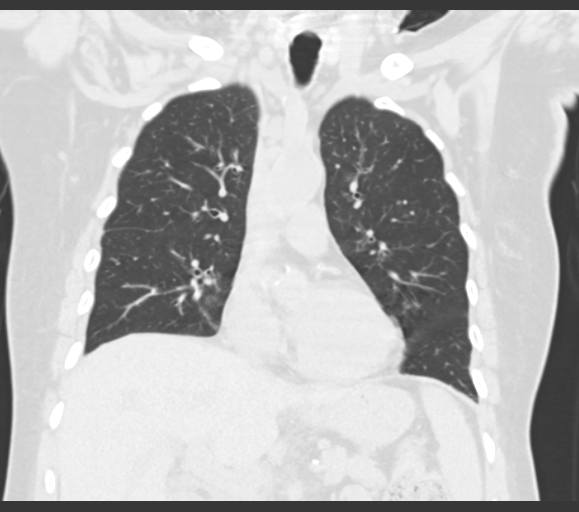
[im 47/79  lung]
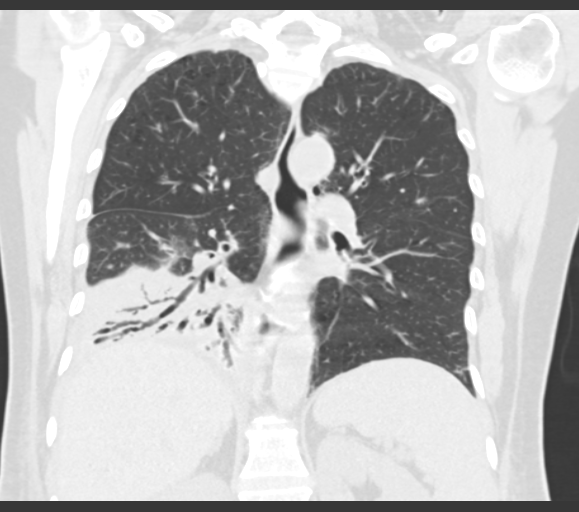

[15 of 36 positions shown; findings below may reference images not displayed]

FINDINGS: Right lower lobe consolidation is again demonstrated with associated
air bronchograms and mild to moderate bronchiectasis. A rounded area
of parenchymal cavitation is seen within the right upper lobe
consolidation which measures approximately 5 x 6 cm on image 33 of
series 2. This shows no significant change in size but does show
mild increase in cavitation since prior exam. Differential diagnosis
remains cavitary pneumonia versus cavitary neoplasm. Small pleural
effusions are seen bilaterally which arm mildly increased since
previous exam.

Patchy airspace disease is seen within the anterior upper lobes
bilaterally in the right middle lobe, which is increased since
previous study. This is most consistent with pneumonia. No other
discrete pulmonary nodules or masses are identified. Mild emphysema
again demonstrated.

No evidence of central endobronchial obstruction. Mild mediastinal
lymphadenopathy is again seen in the right paratracheal and
subcarinal regions measuring 1.4 and 1.6 cm respectively. The shows
no significant change. No other sites of lymphadenopathy identified.
IMPRESSION: Persistent dense right lower lobe consolidation with 6 cm rounded
area showing increased parenchymal cavitation. Differential
diagnosis remains cavitary pneumonia versus cavitary neoplasm.

Increased airspace disease in both upper lobes and right middle
lobe, consistent with pneumonia.

Increased small bilateral pleural effusions.

Stable mild mediastinal lymphadenopathy.

## 2015-07-11 NOTE — Progress Notes (Signed)
Agree with A/P. Dr Marin Comment

## 2015-07-12 ENCOUNTER — Ambulatory Visit (INDEPENDENT_AMBULATORY_CARE_PROVIDER_SITE_OTHER): Payer: Managed Care, Other (non HMO) | Admitting: Family Medicine

## 2015-07-12 ENCOUNTER — Ambulatory Visit (INDEPENDENT_AMBULATORY_CARE_PROVIDER_SITE_OTHER): Payer: Managed Care, Other (non HMO)

## 2015-07-12 VITALS — BP 128/74 | HR 100 | Temp 99.9°F | Resp 14 | Ht 63.0 in | Wt 143.0 lb

## 2015-07-12 DIAGNOSIS — E871 Hypo-osmolality and hyponatremia: Secondary | ICD-10-CM

## 2015-07-12 DIAGNOSIS — M545 Low back pain, unspecified: Secondary | ICD-10-CM

## 2015-07-12 DIAGNOSIS — J471 Bronchiectasis with (acute) exacerbation: Secondary | ICD-10-CM | POA: Diagnosis not present

## 2015-07-12 DIAGNOSIS — R509 Fever, unspecified: Secondary | ICD-10-CM | POA: Diagnosis not present

## 2015-07-12 DIAGNOSIS — I878 Other specified disorders of veins: Secondary | ICD-10-CM

## 2015-07-12 DIAGNOSIS — R350 Frequency of micturition: Secondary | ICD-10-CM | POA: Diagnosis not present

## 2015-07-12 DIAGNOSIS — L03115 Cellulitis of right lower limb: Secondary | ICD-10-CM | POA: Diagnosis not present

## 2015-07-12 LAB — POCT CBC
GRANULOCYTE PERCENT: 73.3 % (ref 37–80)
HEMATOCRIT: 41.6 % (ref 37.7–47.9)
Hemoglobin: 13.1 g/dL (ref 12.2–16.2)
Lymph, poc: 2.1 (ref 0.6–3.4)
MCH: 28.2 pg (ref 27–31.2)
MCHC: 31.4 g/dL — AB (ref 31.8–35.4)
MCV: 89.6 fL (ref 80–97)
MID (cbc): 0.7 (ref 0–0.9)
MPV: 6.8 fL (ref 0–99.8)
POC Granulocyte: 7.8 — AB (ref 2–6.9)
POC LYMPH PERCENT: 20 %L (ref 10–50)
POC MID %: 6.7 %M (ref 0–12)
Platelet Count, POC: 357 10*3/uL (ref 142–424)
RBC: 4.64 M/uL (ref 4.04–5.48)
RDW, POC: 14.5 %
WBC: 10.6 10*3/uL — AB (ref 4.6–10.2)

## 2015-07-12 LAB — POCT URINALYSIS DIPSTICK
Bilirubin, UA: NEGATIVE
Blood, UA: NEGATIVE
Glucose, UA: NEGATIVE
Ketones, UA: NEGATIVE
LEUKOCYTES UA: NEGATIVE
Nitrite, UA: NEGATIVE
PH UA: 7
PROTEIN UA: NEGATIVE
Spec Grav, UA: 1.015
Urobilinogen, UA: 0.2

## 2015-07-12 LAB — POCT UA - MICROSCOPIC ONLY
BACTERIA, U MICROSCOPIC: NEGATIVE
CRYSTALS, UR, HPF, POC: NEGATIVE
Casts, Ur, LPF, POC: NEGATIVE
MUCUS UA: NEGATIVE
RBC, urine, microscopic: NEGATIVE
WBC, Ur, HPF, POC: NEGATIVE
Yeast, UA: NEGATIVE

## 2015-07-12 IMAGING — CR DG CHEST 1V PORT
1 series · 1 of 1 positions shown · non-contrast
Comparison: Portable exam 1519 hr compared 07/13/2014

CLINICAL DATA: Line placement

EXAM:
PORTABLE CHEST - 1 VIEW

[AP]
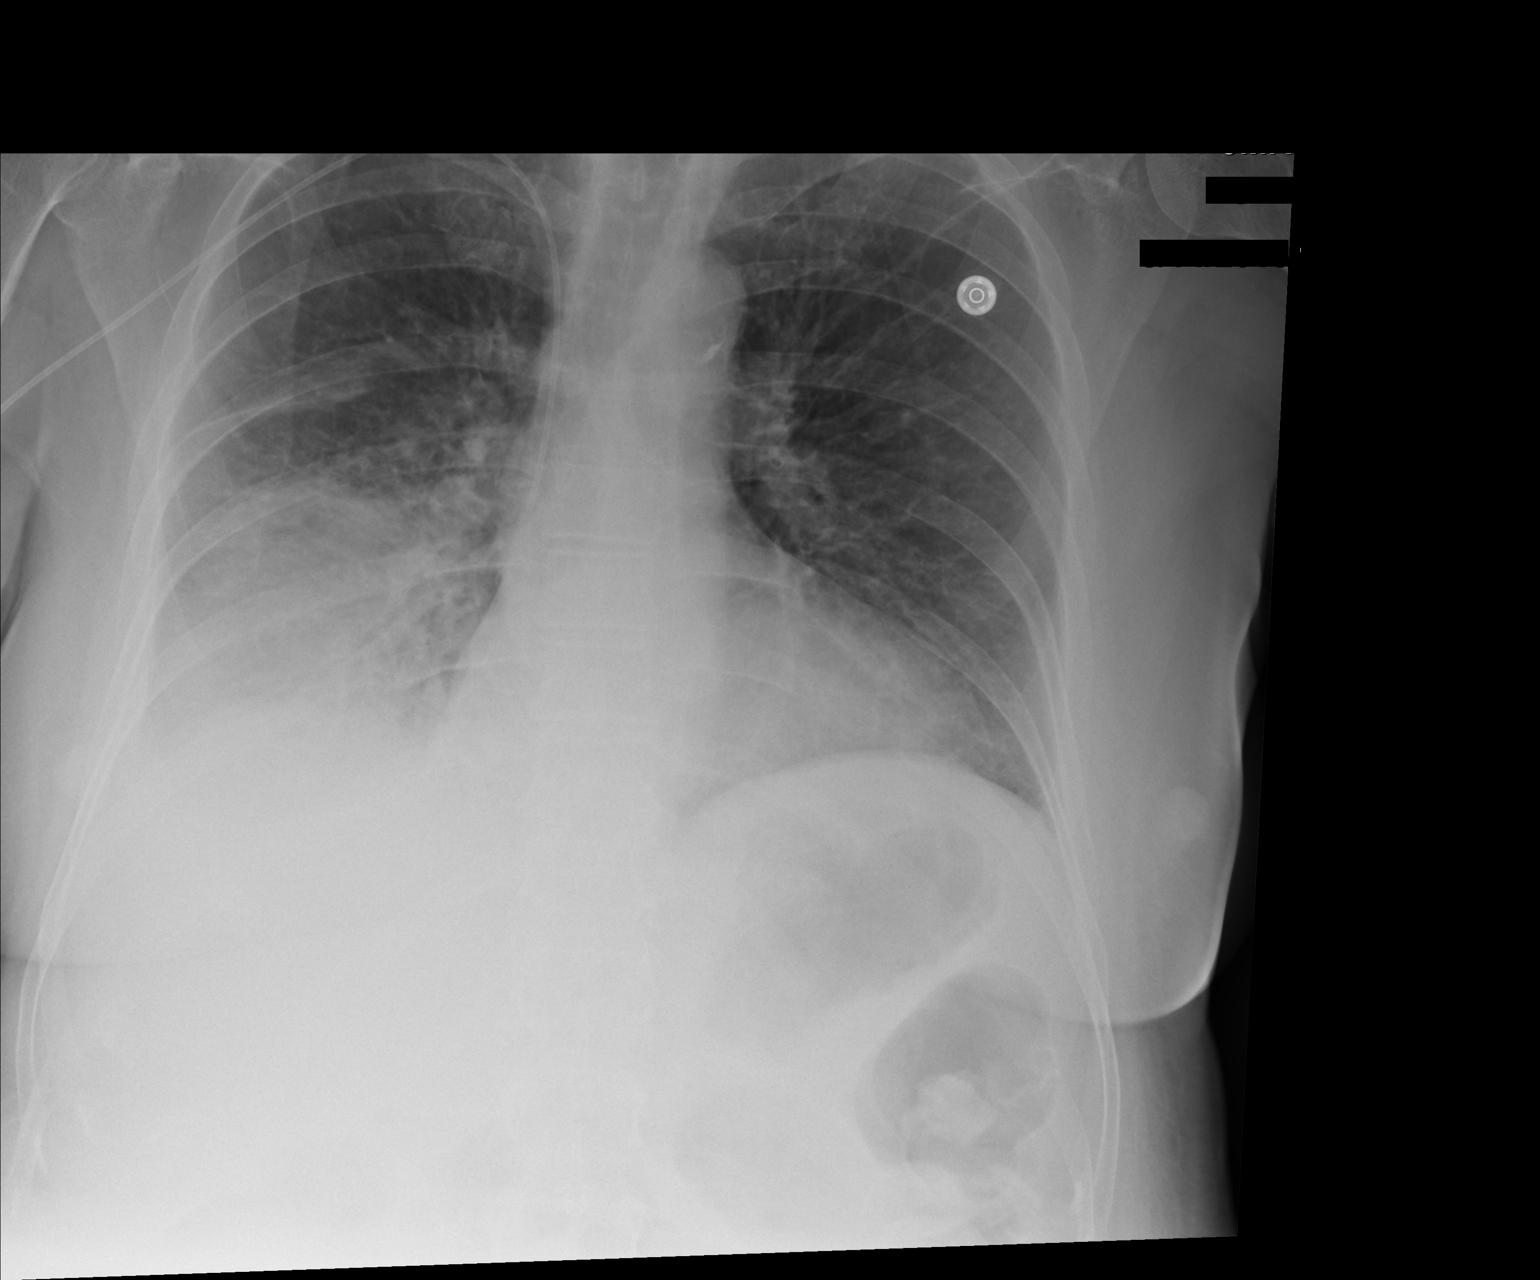

[1 of 1 positions shown; findings below may reference images not displayed]

FINDINGS: RIGHT arm PICC line tip projects over mid SVC.

Upper normal heart size.

Normal mediastinal contours and pulmonary vascularity.

Persistent RIGHT lower lobe consolidation consistent with pneumonia.

Question small coexistent RIGHT pleural effusion.

Central peribronchial thickening.

Remaining lungs clear.

No pneumothorax.

Area of cavitation seen on previous exam is less evident on current
study.
IMPRESSION: Tip of RIGHT arm PICC line projects over mid SVC.

Persistent RIGHT lower lobe pneumonia and suspect small RIGHT
pleural effusion.

## 2015-07-12 NOTE — Progress Notes (Addendum)
Patient ID: BEYA TIPPS, female   DOB: 1951/02/11, 64 y.o.   MRN: [id number]   Subjective:  This chart was scribed for [name], MD by [facility], medical scribe at [facility].The patient was seen in exam room 01 and the patient's care was started at 7:38 PM.   Patient ID: [name], female    DOB: 17-Sep-1951, 64 y.o.   MRN: [id number]  07/12/2015  Follow-up; Leg Swelling; and Back Pain  HPI HPI Comments: KEIERRA NUDO is a 64 y.o. female who presents to [facility] for a follow up and right hip pain. Right Hip pain: Today, did not work. Has not worked since Sunday because of a fall that occurred that day. She tripped and hitting her head. Later that day she developed right hip pain. She would like a leave of absence from work. Pain unchanged from last visit on Sunday. Scheduled to return to work on Saturday. Taking pain medicine/hydrocodone and muscle relaxer/Zanaflex at night. Occasionally taking advil and hydrocodone during the day.  No longer limping.  No n/t/w in legs.  No b/b dysfunction. No saddle paresthesias.  Leg swelling: Given rocephin and prescribed doxycycline; also continued on keflex  Today, the redness and swelling has improved, but she is still in pain. Can only wear one pair of shoes due to the pain. Icing the legs for relief. She does complain of itching of the lower extremities.  Elevating legs some.  Fever and chills: Documented fever of 101 on Monday, also had hot/cold spells that day. Achiness all over started today.  Nauseated today which is new.  Chills today.    Cough:  Given doxycycline and continued on mucinex, she will follow up with pulmonology. Today the cough has improved, no longer producing phlegm. Rhinorrhea and fatigue as associated symptoms. Fatigue with exertion but no shortness of breath. She denies ear pain, and sore throat.  Completing Doxycycline in two days.  UTI: Urine culture was negative at visit last  week, given rocephin and continued on keflex.Today symptoms are improving but she still has urinary urgency but no dysuria.   Hyponatremia  Potassium and sodium levels low at last visit. She does drink water. KCL prescribed.  Review of Systems  Constitutional: Positive for fever, chills, diaphoresis and fatigue.  HENT: Negative for congestion, ear pain, rhinorrhea and sore throat.   Respiratory: Positive for cough. Negative for shortness of breath and wheezing.   Cardiovascular: Positive for leg swelling. Negative for chest pain and palpitations.  Gastrointestinal: Positive for nausea. Negative for vomiting, abdominal pain, diarrhea and blood in stool.  Genitourinary: Positive for urgency and frequency. Negative for dysuria, hematuria and flank pain.  Musculoskeletal: Positive for back pain, arthralgias and gait problem.  Skin: Negative for rash.  Neurological: Positive for dizziness and headaches.    Past Medical History  Diagnosis Date  . Hypertension     essential hypertension  . Tobacco abuse     smoking about 1/2 pk of cigarettes a day  . Anxiety   . Depression     psychiatrist Dr. Toy Care  . Respiratory infection   . Shortness of breath   . Malaise   . Lightheadedness   . Emphysematous COPD     changes in right base along with patchy areas on last x-ray  . COPD (chronic obstructive pulmonary disease)   . History of echocardiogram 12/24/2006    Est. EF of 71-06% NORMAL LV SYSTOLIC FUNCTION WITH IMPAIRED  RELAXATION -- MILD AORTIC SCLEROSIS -- NORMAL PALONARY ARTERY PRESSURE -- NO OLD ECHOS FOR COMPARISON -- [name], MD  . History of cardiovascular stress test 08/15/2004    EF of 70% -- Normal stress cardiolite.  There is no evidence of ischemia and there is normal LV function. -- [name]. MD  . Diastolic dysfunction   . Allergy   . Asthma   . Heart murmur   . Pneumonia     "several times since May 2015" (07/06/2014)  . Chronic bronchitis     "get it alot;  maybe not q yr" (07/06/2014)   Past Surgical History  Procedure Laterality Date  . Tubal ligation  1986   Allergies  Allergen Reactions  . Augmentin [Amoxicillin-Pot Clavulanate]     UNKNOWN  . Ceclor [Cefaclor]     hives  . Escitalopram Oxalate     Pt does not recall ever taking medication  . Penicillins     Rash------pt stated she CAN take Penicillin  . Sertraline Hcl     UNKNOWN  . Sulfa Drugs Cross Reactors     Hives and rash   Current Outpatient Prescriptions  Medication Sig Dispense Refill  . albuterol (PROVENTIL HFA;VENTOLIN HFA) 108 (90 BASE) MCG/ACT inhaler Inhale 2 puffs into the lungs every 6 (six) hours as needed for wheezing or shortness of breath. 1 Inhaler 5  . albuterol (PROVENTIL) (2.5 MG/3ML) 0.083% nebulizer solution Take 3 mLs (2.5 mg total) by nebulization every 3 (three) hours as needed for wheezing or shortness of breath. 75 mL 12  . ALPRAZolam (XANAX) 0.5 MG tablet Take 0.5 mg by mouth 2 (two) times daily.    . benzonatate (TESSALON) 100 MG capsule TAKE 1 TO 2 CAPSULES BY MOUTH 3 TIMES A DAY AS NEEDED FOR COUGH 60 capsule 2  . Calcium Carbonate-Vitamin D (CALCIUM-VITAMIN D) 500-200 MG-UNIT per tablet Take 1 tablet by mouth daily.    . divalproex (DEPAKOTE ER) 500 MG 24 hr tablet Take 500 mg by mouth at bedtime.     [name] doxycycline (VIBRAMYCIN) 100 MG capsule Take 1 capsule (100 mg total) by mouth 2 (two) times daily. 20 capsule 0  . fluticasone (FLONASE) 50 MCG/ACT nasal spray Place 2 sprays into both nostrils daily. 16 g 2  . furosemide (LASIX) 20 MG tablet TAKE 2 TABLETS BY MOUTH EVERY DAY 180 tablet 1  . guaiFENesin (MUCINEX) 600 MG 12 hr tablet Take 600 mg by mouth 2 (two) times daily as needed.    [name] HYDROcodone-acetaminophen (NORCO/VICODIN) 5-325 MG per tablet Take 1 tablet by mouth every 6 (six) hours as needed for moderate pain. 30 tablet 0  . losartan-hydrochlorothiazide (HYZAAR) 50-12.5 MG per tablet TAKE 0.5 TABLETS BY MOUTH DAILY. 30 tablet 5  .  Multiple Vitamin (MULTIVITAMIN) tablet Take 1 tablet by mouth daily.    . nadolol (CORGARD) 40 MG tablet TAKE 1/2 TABLET BY MOUTH EVERY DAY 45 tablet 3  . nortriptyline (PAMELOR) 50 MG capsule Take 100 mg by mouth at bedtime.    . potassium chloride SA (K-DUR,KLOR-CON) 20 MEQ tablet Take 1 tablet (20 mEq total) by mouth daily. 7 tablet 0  . senna (SENOKOT) 8.6 MG TABS tablet Take 2 tablets (17.2 mg total) by mouth at bedtime. 120 each 0  . tiZANidine (ZANAFLEX) 4 MG tablet Take 1 tablet (4 mg total) by mouth every 6 (six) hours as needed for muscle spasms. 40 tablet 0   No current facility-administered medications for this visit.   Social  History   Social History  . Marital Status: Married    Spouse Name: N/A  . Number of Children: N/A  . Years of Education: N/A   Occupational History  . SALES ASSOCIATE     [employer]   Social History Main Topics  . Smoking status: Current Some Day Smoker -- 1.00 packs/day for 42 years    Types: Cigarettes  . Smokeless tobacco: Never Used  . Alcohol Use: No  . Drug Use: No  . Sexual Activity: No   Other Topics Concern  . Not on file   Social History Narrative       Objective:    BP 128/74 mmHg  Pulse 100  Temp(Src) 99.9 F (37.7 C) (Oral)  Resp 14  Ht _0  (1.6 m)  Wt 143 lb (64.864 kg)  BMI 25.34 kg/m2  SpO2 92% Physical Exam  Constitutional: She is oriented to person, place, and time. She appears well-developed and well-nourished. No distress.  HENT:  Head: Normocephalic and atraumatic.  Right Ear: External ear normal.  Left Ear: External ear normal.  Nose: Nose normal.  Mouth/Throat: Oropharynx is clear and moist.  Eyes: Conjunctivae and EOM are normal. Pupils are equal, round, and reactive to light.  Neck: Normal range of motion. Neck supple. Carotid bruit is not present. No thyromegaly present.  Cardiovascular: Normal rate, regular rhythm, normal heart sounds and intact distal pulses.  Exam reveals no gallop and no friction  rub.   No murmur heard. Pulmonary/Chest: Effort normal and breath sounds normal. She has no wheezes.  Rales right lung base. No tachypnea   Abdominal: Soft. Bowel sounds are normal. She exhibits no distension and no mass. There is no tenderness. There is no rebound and no guarding.  Musculoskeletal: She exhibits edema.  Diffuse tenderness in the lumbar spin. Full ROM of right hip.  1+ pitting edema of the proximal calves bilaterally. Legs warm to touch, erythema and swelling significant  decreased from last visit. Scattered excoriation and scaling of the skin of bilateral lower extremities.   Lymphadenopathy:    She has no cervical adenopathy.  Neurological: She is alert and oriented to person, place, and time. No cranial nerve deficit.  Skin: Skin is warm and dry. No rash noted. She is not diaphoretic. There is erythema. No pallor.  Psychiatric: She has a normal mood and affect. Her behavior is normal.   Results for orders placed or performed in visit on 07/12/15  POCT CBC  Result Value Ref Range   WBC 10.6 (A) 4.6 - 10.2 K/uL   Lymph, poc 2.1 0.6 - 3.4   POC LYMPH PERCENT 20.0 10 - 50 %L   MID (cbc) 0.7 0 - 0.9   POC MID % 6.7 0 - 12 %M   POC Granulocyte 7.8 (A) 2 - 6.9   Granulocyte percent 73.3 37 - 80 %G   RBC 4.64 4.04 - 5.48 M/uL   Hemoglobin 13.1 12.2 - 16.2 g/dL   HCT, POC 41.6 37.7 - 47.9 %   MCV 89.6 80 - 97 fL   MCH, POC 28.2 27 - 31.2 pg   MCHC 31.4 (A) 31.8 - 35.4 g/dL   RDW, POC 14.5 %   Platelet Count, POC 357 142 - 424 K/uL   MPV 6.8 0 - 99.8 fL  POCT urinalysis dipstick  Result Value Ref Range   Color, UA yellow    Clarity, UA clear    Glucose, UA neg    Bilirubin, UA neg  Ketones, UA neg    Spec Grav, UA 1.015    Blood, UA neg    pH, UA 7.0    Protein, UA neg    Urobilinogen, UA 0.2    Nitrite, UA neg    Leukocytes, UA Negative Negative  POCT UA - Microscopic Only  Result Value Ref Range   WBC, Ur, HPF, POC neg    RBC, urine, microscopic neg      Bacteria, U Microscopic neg    Mucus, UA neg    Epithelial cells, urine per micros 0-2    Crystals, Ur, HPF, POC neg    Casts, Ur, LPF, POC neg    Yeast, UA neg       [facility] reading (PRIMARY) by  Dr. Tamala Julian.  CXR:  Unchanged RML and RLL changes; infiltrate LLL new.   Assessment & Plan:   1. Cellulitis of right lower extremity   2. Venous stasis of both lower extremities   3. Hyponatremia   4. Bilateral low back pain without sciatica   5. Urinary frequency   6. Fever, unspecified fever cause   7. Bronchiectasis with acute exacerbation     1.  RLE cellulitis: improving; continue Doxycycline. 2. Venous stasis with dermatitis B:  Persistent; recommend applying topical steroid ointment to legs bid; elevate legs when possible; recommend wearing compression stockings during the day.  3.  Hyponatremia: improved; repeat today. 4.  B lower back pain: persistent; suffered with a fall since last visit; good range of motion in lumbar spine and R hip; continue Zanaflex and hydrocodone; appointment with Dr. Kendall Flack on 08/01/15. 5.  Urinary frequency/recent UTI: improved; repeat u/a negative; repeat urine culture due to low grade fever today. 6.  Low grade fever persistent: improved cough; improving urinary symptoms; improved cellulitis; etiology to persistent low grade fever unclear.  Repeat urine culture; obtain ESR, ANA, RF.  Stable WBC count. Stable CXR.  RTC for worsening. 7.  Bronchiectasis:  Improved with Doxycycline and Rocephin injection at last visit.  CXR stable.   No orders of the defined types were placed in this encounter.    No Follow-up on file.  I personally performed the services described in this documentation, which was scribed in my presence. The recorded information has been reviewed and considered.  Aanchal Cope Elayne Guerin, M.D. [facility] [address] [place], [place]  [address] [phone] phone [phone] fax

## 2015-07-12 NOTE — Patient Instructions (Signed)
1.  Apply steroid ointment to legs twice daily for the next two weeks. 2. Wear compression stockings during the day; remove at nighttime. 3.  Elevated legs as much as possible.

## 2015-07-13 LAB — BASIC METABOLIC PANEL
BUN: 8 mg/dL (ref 7–25)
CO2: 32 mmol/L — ABNORMAL HIGH (ref 20–31)
CREATININE: 0.56 mg/dL (ref 0.50–0.99)
Calcium: 9.2 mg/dL (ref 8.6–10.4)
Chloride: 90 mmol/L — ABNORMAL LOW (ref 98–110)
Glucose, Bld: 97 mg/dL (ref 65–99)
Potassium: 3.4 mmol/L — ABNORMAL LOW (ref 3.5–5.3)
SODIUM: 133 mmol/L — AB (ref 135–146)

## 2015-07-13 LAB — RHEUMATOID FACTOR: Rhuematoid fact SerPl-aCnc: <10 IU/mL (ref <=14)

## 2015-07-13 LAB — SEDIMENTATION RATE: SED RATE: 44 mm/h — AB (ref 0–30)

## 2015-07-14 ENCOUNTER — Other Ambulatory Visit: Payer: Self-pay | Admitting: Family Medicine

## 2015-07-14 LAB — URINE CULTURE
COLONY COUNT: NO GROWTH
Organism ID, Bacteria: NO GROWTH

## 2015-07-16 LAB — ANA: ANA: POSITIVE — AB

## 2015-07-16 LAB — ANTI-NUCLEAR AB-TITER (ANA TITER): ANA Titer 1: NEGATIVE

## 2015-07-16 NOTE — Telephone Encounter (Signed)
Dr Tamala Julian, you Rxd this for pt in May, but then discussed AR again at 8/11 OV. Can we give RFs? Pended.

## 2015-07-18 ENCOUNTER — Telehealth: Payer: Self-pay | Admitting: Family Medicine

## 2015-07-18 NOTE — Telephone Encounter (Signed)
Patient returned call about her lab results. Clinical staff not available to help patient so I gave her the following results from Dr. Tamala Julian:  Notes Recorded by Wardell Honour, MD on 07/17/2015 at 9:47 AM Call --- 1. Urine culture negative; no evidence of persistent urinary tract infection. 2. No evidence of rheumatoid arthritis or lupus. 3. Sodium has improved to 133. 4. Potassium also slightly better. 5. How is patient feeling?  Patient expressed understanding of her lab results. Patient reports that she is feeling slightly better however she may still come in for a follow up with Dr. Tamala Julian at the walk in center. She states that she if off work this Saturday so she plans to rest.   [phone]

## 2015-07-25 ENCOUNTER — Telehealth: Payer: Self-pay

## 2015-07-25 ENCOUNTER — Telehealth: Payer: Self-pay | Admitting: Family Medicine

## 2015-07-25 NOTE — Telephone Encounter (Signed)
Pt calling back about labs. Notified. She is still having some swelling and pain but is overall ok.  Wants to know when her leave of absense paperwork will be ready

## 2015-07-25 NOTE — Telephone Encounter (Signed)
Patient dropped off FMLA forms on 07/22/2015. Called patient to see what condition the FMLA is for. No answer, left VM. Waiting on return call. Will hold papers until the patient calls back.

## 2015-07-30 ENCOUNTER — Other Ambulatory Visit: Payer: Self-pay | Admitting: Cardiology

## 2015-07-30 NOTE — Telephone Encounter (Signed)
Forms placed in Dr. Thompson Caul box on 07/30/2015. Patient was out of work on 07/12/15-07/16/15. She had cellulitis and lower back pain. I highlighted the sections on the form that need some attention. Can you please return by Friday if possible? Thanks so much!

## 2015-08-03 NOTE — Telephone Encounter (Signed)
Patient called to check the status of her forms. Are they almost complete? She needs them faxed by today at 2pm.  I think I filled in some of the sections already. I will be happy to come down to [address] and review forms with you. Thanks so much Dr. Tamala Julian for all your help regarding this patient's care :-)  Thanks, [facility]

## 2015-08-03 NOTE — Telephone Encounter (Signed)
Forms completed and ready for faxing.

## 2015-08-04 NOTE — Telephone Encounter (Signed)
FMLA paperwork completed on 08/03/15; Callie placed on disability desk.

## 2015-08-16 ENCOUNTER — Inpatient Hospital Stay (HOSPITAL_COMMUNITY): Payer: Managed Care, Other (non HMO)

## 2015-08-16 ENCOUNTER — Encounter (HOSPITAL_COMMUNITY): Payer: Self-pay | Admitting: *Deleted

## 2015-08-16 ENCOUNTER — Inpatient Hospital Stay (HOSPITAL_COMMUNITY)
Admission: EM | Admit: 2015-08-16 | Discharge: 2015-08-21 | DRG: 871 | Disposition: A | Discharge status: Home / Self Care | Payer: Managed Care, Other (non HMO) | Attending: Internal Medicine | Admitting: Internal Medicine

## 2015-08-16 ENCOUNTER — Emergency Department (HOSPITAL_COMMUNITY): Payer: Managed Care, Other (non HMO)

## 2015-08-16 DIAGNOSIS — R531 Weakness: Secondary | ICD-10-CM | POA: Diagnosis not present

## 2015-08-16 DIAGNOSIS — F419 Anxiety disorder, unspecified: Secondary | ICD-10-CM | POA: Diagnosis present

## 2015-08-16 DIAGNOSIS — E876 Hypokalemia: Secondary | ICD-10-CM | POA: Diagnosis present

## 2015-08-16 DIAGNOSIS — F17210 Nicotine dependence, cigarettes, uncomplicated: Secondary | ICD-10-CM | POA: Diagnosis present

## 2015-08-16 DIAGNOSIS — J479 Bronchiectasis, uncomplicated: Secondary | ICD-10-CM

## 2015-08-16 DIAGNOSIS — F329 Major depressive disorder, single episode, unspecified: Secondary | ICD-10-CM | POA: Diagnosis present

## 2015-08-16 DIAGNOSIS — R6521 Severe sepsis with septic shock: Secondary | ICD-10-CM | POA: Diagnosis present

## 2015-08-16 DIAGNOSIS — Z8249 Family history of ischemic heart disease and other diseases of the circulatory system: Secondary | ICD-10-CM

## 2015-08-16 DIAGNOSIS — K219 Gastro-esophageal reflux disease without esophagitis: Secondary | ICD-10-CM | POA: Diagnosis not present

## 2015-08-16 DIAGNOSIS — Z8701 Personal history of pneumonia (recurrent): Secondary | ICD-10-CM | POA: Diagnosis present

## 2015-08-16 DIAGNOSIS — Z825 Family history of asthma and other chronic lower respiratory diseases: Secondary | ICD-10-CM | POA: Diagnosis not present

## 2015-08-16 DIAGNOSIS — I119 Hypertensive heart disease without heart failure: Secondary | ICD-10-CM | POA: Diagnosis not present

## 2015-08-16 DIAGNOSIS — J449 Chronic obstructive pulmonary disease, unspecified: Secondary | ICD-10-CM | POA: Diagnosis present

## 2015-08-16 DIAGNOSIS — J189 Pneumonia, unspecified organism: Secondary | ICD-10-CM | POA: Diagnosis not present

## 2015-08-16 DIAGNOSIS — Z823 Family history of stroke: Secondary | ICD-10-CM | POA: Diagnosis not present

## 2015-08-16 DIAGNOSIS — M6282 Rhabdomyolysis: Secondary | ICD-10-CM | POA: Diagnosis present

## 2015-08-16 DIAGNOSIS — J9601 Acute respiratory failure with hypoxia: Secondary | ICD-10-CM | POA: Diagnosis present

## 2015-08-16 DIAGNOSIS — J439 Emphysema, unspecified: Secondary | ICD-10-CM | POA: Diagnosis not present

## 2015-08-16 DIAGNOSIS — E871 Hypo-osmolality and hyponatremia: Secondary | ICD-10-CM | POA: Diagnosis present

## 2015-08-16 DIAGNOSIS — A419 Sepsis, unspecified organism: Principal | ICD-10-CM | POA: Diagnosis present

## 2015-08-16 DIAGNOSIS — R6520 Severe sepsis without septic shock: Secondary | ICD-10-CM | POA: Diagnosis not present

## 2015-08-16 DIAGNOSIS — K5900 Constipation, unspecified: Secondary | ICD-10-CM | POA: Diagnosis present

## 2015-08-16 DIAGNOSIS — Z803 Family history of malignant neoplasm of breast: Secondary | ICD-10-CM | POA: Diagnosis not present

## 2015-08-16 HISTORY — DX: Gastro-esophageal reflux disease without esophagitis: K21.9

## 2015-08-16 LAB — BASIC METABOLIC PANEL
ANION GAP: 8 (ref 5–15)
Anion gap: 10 (ref 5–15)
BUN: 14 mg/dL (ref 6–20)
BUN: 8 mg/dL (ref 6–20)
CALCIUM: 7.4 mg/dL — AB (ref 8.9–10.3)
CHLORIDE: 77 mmol/L — AB (ref 101–111)
CO2: 32 mmol/L (ref 22–32)
CO2: 30 mmol/L (ref 22–32)
Calcium: 7.4 mg/dL — ABNORMAL LOW (ref 8.9–10.3)
Chloride: 92 mmol/L — ABNORMAL LOW (ref 101–111)
Creatinine, Ser: 0.73 mg/dL (ref 0.44–1.00)
Creatinine, Ser: 0.4 mg/dL — ABNORMAL LOW (ref 0.44–1.00)
GFR calc Af Amer: >60 mL/min (ref >60)
GFR calc non Af Amer: >60 mL/min (ref >60)
Glucose, Bld: 121 mg/dL — ABNORMAL HIGH (ref 65–99)
Glucose, Bld: 159 mg/dL — ABNORMAL HIGH (ref 65–99)
POTASSIUM: 3 mmol/L — AB (ref 3.5–5.1)
POTASSIUM: 2.6 mmol/L — AB (ref 3.5–5.1)
SODIUM: 119 mmol/L — AB (ref 135–145)
SODIUM: 130 mmol/L — AB (ref 135–145)

## 2015-08-16 LAB — EXPECTORATED SPUTUM ASSESSMENT W REFEX TO RESP CULTURE

## 2015-08-16 LAB — CBC
HEMATOCRIT: 30.2 % — AB (ref 36.0–46.0)
HEMOGLOBIN: 10.7 g/dL — AB (ref 12.0–15.0)
MCH: 30 pg (ref 26.0–34.0)
MCHC: 35.4 g/dL (ref 30.0–36.0)
MCV: 84.6 fL (ref 78.0–100.0)
Platelets: 232 10*3/uL (ref 150–400)
RBC: 3.57 MIL/uL — AB (ref 3.87–5.11)
RDW: 13.4 % (ref 11.5–15.5)
WBC: 14.7 10*3/uL — AB (ref 4.0–10.5)

## 2015-08-16 LAB — URINE MICROSCOPIC-ADD ON

## 2015-08-16 LAB — URINALYSIS, ROUTINE W REFLEX MICROSCOPIC
BILIRUBIN URINE: NEGATIVE
Glucose, UA: NEGATIVE mg/dL
Hgb urine dipstick: NEGATIVE
Ketones, ur: NEGATIVE mg/dL
NITRITE: POSITIVE — AB
Protein, ur: NEGATIVE mg/dL
SPECIFIC GRAVITY, URINE: 1.005 (ref 1.005–1.030)
UROBILINOGEN UA: 0.2 mg/dL (ref 0.0–1.0)
pH: 5 (ref 5.0–8.0)

## 2015-08-16 LAB — EXPECTORATED SPUTUM ASSESSMENT W GRAM STAIN, RFLX TO RESP C

## 2015-08-16 LAB — GLUCOSE, CAPILLARY: Glucose-Capillary: 146 mg/dL — ABNORMAL HIGH (ref 65–99)

## 2015-08-16 LAB — STREP PNEUMONIAE URINARY ANTIGEN: STREP PNEUMO URINARY ANTIGEN: NEGATIVE

## 2015-08-16 LAB — I-STAT CG4 LACTIC ACID, ED
Lactic Acid, Venous: 1.14 mmol/L (ref 0.5–2.0)
Lactic Acid, Venous: 1.04 mmol/L (ref 0.5–2.0)

## 2015-08-16 LAB — CREATININE, URINE, RANDOM: Creatinine, Urine: <10 mg/dL

## 2015-08-16 LAB — CK: CK TOTAL: 650 U/L — AB (ref 38–234)

## 2015-08-16 LAB — SODIUM, URINE, RANDOM: SODIUM UR: 30 mmol/L

## 2015-08-16 LAB — MRSA PCR SCREENING: MRSA by PCR: NEGATIVE

## 2015-08-16 MED ORDER — DIVALPROEX SODIUM ER 500 MG PO TB24
500.0000 mg | ORAL_TABLET | Freq: Every day | ORAL | Status: DC
  Administered 2015-08-16 – 2015-08-20 (×5): 500 mg via ORAL
  Filled 2015-08-16 (×8): qty 1

## 2015-08-16 MED ORDER — POTASSIUM CHLORIDE 10 MEQ/100ML IV SOLN
10.0000 meq | INTRAVENOUS | Status: AC
  Administered 2015-08-16 – 2015-08-17 (×3): 10 meq via INTRAVENOUS
  Filled 2015-08-16 (×3): qty 100

## 2015-08-16 MED ORDER — BENZONATATE 100 MG PO CAPS
200.0000 mg | ORAL_CAPSULE | Freq: Three times a day (TID) | ORAL | Status: DC | PRN
  Administered 2015-08-16 – 2015-08-20 (×6): 200 mg via ORAL
  Filled 2015-08-16 (×6): qty 2

## 2015-08-16 MED ORDER — ONDANSETRON HCL 4 MG PO TABS: 4.0000 mg | ORAL_TABLET | Freq: Four times a day (QID) | ORAL | Status: DC | PRN

## 2015-08-16 MED ORDER — NORTRIPTYLINE HCL 25 MG PO CAPS
100.0000 mg | ORAL_CAPSULE | Freq: Every day | ORAL | Status: DC
  Administered 2015-08-16 – 2015-08-20 (×5): 100 mg via ORAL
  Filled 2015-08-16 (×7): qty 4

## 2015-08-16 MED ORDER — POLYETHYLENE GLYCOL 3350 17 G PO PACK
17.0000 g | PACK | Freq: Every day | ORAL | Status: DC | PRN
  Filled 2015-08-16: qty 1

## 2015-08-16 MED ORDER — DEXTROSE 5 % IV SOLN
1.0000 g | Freq: Once | INTRAVENOUS | Status: AC
  Administered 2015-08-16: 1 g via INTRAVENOUS
  Filled 2015-08-16: qty 10

## 2015-08-16 MED ORDER — SODIUM CHLORIDE 0.9 % IV BOLUS (SEPSIS)
1000.0000 mL | Freq: Once | INTRAVENOUS | Status: AC
  Administered 2015-08-16: 1000 mL via INTRAVENOUS

## 2015-08-16 MED ORDER — SODIUM CHLORIDE 0.9 % IJ SOLN
3.0000 mL | Freq: Two times a day (BID) | INTRAMUSCULAR | Status: DC
  Administered 2015-08-16 – 2015-08-21 (×6): 3 mL via INTRAVENOUS

## 2015-08-16 MED ORDER — ACETAMINOPHEN 650 MG RE SUPP: 650.0000 mg | Freq: Four times a day (QID) | RECTAL | Status: DC | PRN

## 2015-08-16 MED ORDER — SODIUM CHLORIDE 0.9 % IV SOLN
INTRAVENOUS | Status: AC
  Administered 2015-08-16: 17:00:00 via INTRAVENOUS

## 2015-08-16 MED ORDER — ONDANSETRON HCL 4 MG/2ML IJ SOLN: 4.0000 mg | Freq: Four times a day (QID) | INTRAMUSCULAR | Status: DC | PRN

## 2015-08-16 MED ORDER — ALPRAZOLAM 0.5 MG PO TABS
0.5000 mg | ORAL_TABLET | Freq: Every day | ORAL | Status: DC
  Administered 2015-08-16 – 2015-08-20 (×5): 0.5 mg via ORAL
  Filled 2015-08-16 (×5): qty 1

## 2015-08-16 MED ORDER — HYDROCORTISONE NA SUCCINATE PF 100 MG IJ SOLR
50.0000 mg | Freq: Four times a day (QID) | INTRAMUSCULAR | Status: DC
  Administered 2015-08-16 – 2015-08-17 (×4): 50 mg via INTRAVENOUS
  Filled 2015-08-16 (×4): qty 2

## 2015-08-16 MED ORDER — IPRATROPIUM BROMIDE 0.03 % NA SOLN
2.0000 | Freq: Two times a day (BID) | NASAL | Status: DC
  Administered 2015-08-17 – 2015-08-21 (×7): 2 via NASAL
  Filled 2015-08-16: qty 30

## 2015-08-16 MED ORDER — VANCOMYCIN HCL IN DEXTROSE 750-5 MG/150ML-% IV SOLN
750.0000 mg | Freq: Two times a day (BID) | INTRAVENOUS | Status: DC
  Administered 2015-08-17 – 2015-08-18 (×3): 750 mg via INTRAVENOUS
  Filled 2015-08-16 (×6): qty 150

## 2015-08-16 MED ORDER — SODIUM CHLORIDE 0.9 % IV BOLUS (SEPSIS)
1000.0000 mL | INTRAVENOUS | Status: AC
  Administered 2015-08-16 (×4): 1000 mL via INTRAVENOUS

## 2015-08-16 MED ORDER — POTASSIUM CHLORIDE CRYS ER 20 MEQ PO TBCR
40.0000 meq | EXTENDED_RELEASE_TABLET | Freq: Once | ORAL | Status: AC
  Administered 2015-08-16: 40 meq via ORAL
  Filled 2015-08-16: qty 2

## 2015-08-16 MED ORDER — CEFTRIAXONE SODIUM 1 G IJ SOLR: 1.0000 g | INTRAMUSCULAR | Status: DC

## 2015-08-16 MED ORDER — SODIUM CHLORIDE 0.9 % IV SOLN: INTRAVENOUS | Status: DC

## 2015-08-16 MED ORDER — ENOXAPARIN SODIUM 40 MG/0.4ML ~~LOC~~ SOLN
40.0000 mg | SUBCUTANEOUS | Status: DC
  Administered 2015-08-16 – 2015-08-20 (×5): 40 mg via SUBCUTANEOUS
  Filled 2015-08-16 (×6): qty 0.4

## 2015-08-16 MED ORDER — ALBUTEROL SULFATE (2.5 MG/3ML) 0.083% IN NEBU: 2.5000 mg | INHALATION_SOLUTION | Freq: Four times a day (QID) | RESPIRATORY_TRACT | Status: DC | PRN

## 2015-08-16 MED ORDER — SODIUM CHLORIDE 0.9 % IV BOLUS (SEPSIS)
500.0000 mL | INTRAVENOUS | Status: AC
  Administered 2015-08-16: 500 mL via INTRAVENOUS

## 2015-08-16 MED ORDER — SACCHAROMYCES BOULARDII 250 MG PO CAPS
250.0000 mg | ORAL_CAPSULE | Freq: Two times a day (BID) | ORAL | Status: DC
Start: 2015-08-16 — End: 2015-08-21
  Administered 2015-08-16 – 2015-08-21 (×10): 250 mg via ORAL
  Filled 2015-08-16 (×13): qty 1

## 2015-08-16 MED ORDER — SODIUM CHLORIDE 0.9 % IV SOLN
500.0000 mg | Freq: Three times a day (TID) | INTRAVENOUS | Status: DC
  Administered 2015-08-17 – 2015-08-20 (×11): 500 mg via INTRAVENOUS
  Filled 2015-08-16 (×14): qty 500

## 2015-08-16 MED ORDER — ACETAMINOPHEN 325 MG PO TABS
650.0000 mg | ORAL_TABLET | Freq: Four times a day (QID) | ORAL | Status: DC | PRN
  Administered 2015-08-18 – 2015-08-20 (×5): 650 mg via ORAL
  Filled 2015-08-16 (×6): qty 2

## 2015-08-16 MED ORDER — DEXTROSE 5 % IV SOLN
500.0000 mg | Freq: Once | INTRAVENOUS | Status: AC
  Administered 2015-08-16: 500 mg via INTRAVENOUS
  Filled 2015-08-16: qty 500

## 2015-08-16 MED ORDER — INSULIN ASPART 100 UNIT/ML ~~LOC~~ SOLN
0.0000 [IU] | Freq: Three times a day (TID) | SUBCUTANEOUS | Status: DC
  Administered 2015-08-17 (×2): 1 [IU] via SUBCUTANEOUS

## 2015-08-16 MED ORDER — PIPERACILLIN-TAZOBACTAM 3.375 G IVPB 30 MIN: 3.3750 g | Freq: Four times a day (QID) | INTRAVENOUS | Status: DC

## 2015-08-16 MED ORDER — SODIUM CHLORIDE 0.9 % IV SOLN
500.0000 mg | Freq: Three times a day (TID) | INTRAVENOUS | Status: DC
  Administered 2015-08-16: 500 mg via INTRAVENOUS
  Filled 2015-08-16: qty 500

## 2015-08-16 MED ORDER — DEXTROSE 5 % IV SOLN: 500.0000 mg | INTRAVENOUS | Status: DC

## 2015-08-16 MED ORDER — VANCOMYCIN HCL IN DEXTROSE 750-5 MG/150ML-% IV SOLN
750.0000 mg | INTRAVENOUS | Status: AC
  Administered 2015-08-16: 750 mg via INTRAVENOUS
  Filled 2015-08-16: qty 150

## 2015-08-16 NOTE — ED Notes (Addendum)
From work, began feeling dizzy. Weakness and cough x 6 days. Hypotensive for EMS. Initial BP was 74 sys. Spo2 of 84 on room air, came up to 95% on 2L [place].  200 cc of NS en route.  Afib for EMS, unsure if has history of this, pt reports history of 'murmur'

## 2015-08-16 NOTE — Progress Notes (Signed)
CRITICAL VALUE ALERT  Critical value received: 2.6 potassium  Date of notification:  08/16/2015   Time of notification:  2148  Critical value read back: yes  Nurse who received alert: Trula Ore   MD notified (1st page): L.[place] (midlevel)  Time of first page:  2155  MD notified (2nd page):  Time of second page:  Responding MD:   Time MD responded:

## 2015-08-16 NOTE — Progress Notes (Signed)
ANTIBIOTIC CONSULT NOTE - INITIAL  Pharmacy Consult for vancomycin, Primaxin Indication: HCAP  Allergies  Allergen Reactions  . Ceclor [Cefaclor] Hives  . Escitalopram Oxalate     Pt does not recall ever taking medication  . Sertraline Hcl     UNKNOWN  . Sulfa Drugs Cross Reactors Hives and Rash    Hives and rash    Patient Measurements: Weight: 141 lb (63.957 kg) Height: 5'3" Weight (as of 07/12/15): 64.9 kg  Vital Signs: Temp: 98 F (36.7 C) (09/15 1232) Temp Source: Oral (09/15 1232) BP: 90/43 mmHg (09/15 1415) Pulse Rate: 70 (09/15 1415) Intake/Output from previous day:   Intake/Output from this shift: Total I/O In: 3500 [I.V.:3500] Out: 500 [Urine:500]  Labs:  Recent Labs  08/16/15 1100  WBC 14.7*  HGB 10.7*  PLT 232  CREATININE 0.73   Estimated Creatinine Clearance: 63.1 mL/min (by C-G formula based on Cr of 0.73). No results for input(s): VANCOTROUGH, VANCOPEAK, VANCORANDOM, GENTTROUGH, GENTPEAK, GENTRANDOM, TOBRATROUGH, TOBRAPEAK, TOBRARND, AMIKACINPEAK, AMIKACINTROU, AMIKACIN in the last 72 hours.   Microbiology: No results found for this or any previous visit (from the past 720 hour(s)).  Medical History: Past Medical History  Diagnosis Date  . Hypertension     essential hypertension  . Tobacco abuse     smoking about 1/2 pk of cigarettes a day  . Anxiety   . Depression     psychiatrist Dr. Toy Care  . Respiratory infection   . Shortness of breath   . Malaise   . Lightheadedness   . Emphysematous COPD     changes in right base along with patchy areas on last x-ray  . COPD (chronic obstructive pulmonary disease)   . History of echocardiogram 12/24/2006    Est. EF of 12-87% NORMAL LV SYSTOLIC FUNCTION WITH IMPAIRED RELAXATION -- MILD AORTIC SCLEROSIS -- NORMAL PALONARY ARTERY PRESSURE -- NO OLD ECHOS FOR COMPARISON -- [name], MD  . History of cardiovascular stress test 08/15/2004    EF of 70% -- Normal stress cardiolite.  There is no  evidence of ischemia and there is normal LV function. -- [name]. MD  . Diastolic dysfunction   . Allergy   . Asthma   . Heart murmur   . Pneumonia     "several times since May 2015" (07/06/2014)  . Chronic bronchitis     "get it alot; maybe not q yr" (07/06/2014)    Assessment: 53 y/oF with PMH of HTN, anxiety, depression, COPD, asthma who presents with complaints of generalized weakness and productive cough of green and yellow sputum for several weeks. CXR shows worsening of right middle and lower lobe consolidation. Pharmacy consulted to dose Ceftriaxone and Azithromycin for CAP. Of note, patient reports allergy to Cefaclor, but has tolerated Ceftriaxone in 07/2015.   Update: Rocephin/Zithromax have been changed to vancomycin and Primaxin per pharmacy for possible HCAP  9/15 >> Ceftriaxone >> 9/15 9/15 >> Azithromycin >>  9/15 9/15 >> vancomycin >> 9/15 >> Primaxin >>   9/15 blood x 2: sent 9/15 urine: sent   Goal of Therapy:  Appropriate antibiotic dosing for indication/renal function Eradication of infection  Plan:  1) Vancomycin '750mg'$  IV q12 2) Primaxin '500mg'$  IV q8   Adrian Saran, PharmD, BCPS Pager [phone] 08/16/2015 3:54 PM

## 2015-08-16 NOTE — ED Provider Notes (Addendum)
CSN: [id number]     Arrival date & time 08/16/15  1030 History   First MD Initiated Contact with Patient 08/16/15 1211     Chief Complaint  Patient presents with  . Weakness  . Cough   Level V caveat unstable vital signs  (Consider location/radiation/quality/duration/timing/severity/associated sxs/prior Treatment) HPI Patient complains of cough and generalized weakness onset 4 days ago. She had delayed on the floor for most of yesterday and could not get up due to generalized weakness. She returned to work today however she became weak at work and had delayed on the floor. She reports cough productive of green and yellow sputum for several weeks. No treatment prior to coming here. Weakness is worse with standing and improved with lying down. She denies any vomiting. Past Medical History  Diagnosis Date  . Hypertension     essential hypertension  . Tobacco abuse     smoking about 1/2 pk of cigarettes a day  . Anxiety   . Depression     psychiatrist Dr. Toy Care  . Respiratory infection   . Shortness of breath   . Malaise   . Lightheadedness   . Emphysematous COPD     changes in right base along with patchy areas on last x-ray  . COPD (chronic obstructive pulmonary disease)   . History of echocardiogram 12/24/2006    Est. EF of 35-57% NORMAL LV SYSTOLIC FUNCTION WITH IMPAIRED RELAXATION -- MILD AORTIC SCLEROSIS -- NORMAL PALONARY ARTERY PRESSURE -- NO OLD ECHOS FOR COMPARISON -- [name], MD  . History of cardiovascular stress test 08/15/2004    EF of 70% -- Normal stress cardiolite.  There is no evidence of ischemia and there is normal LV function. -- [name]. MD  . Diastolic dysfunction   . Allergy   . Asthma   . Heart murmur   . Pneumonia     "several times since May 2015" (07/06/2014)  . Chronic bronchitis     "get it alot; maybe not q yr" (07/06/2014)   Past Surgical History  Procedure Laterality Date  . Tubal ligation  1986   Family History  Problem  Relation Age of Onset  . Heart disease Mother   . Stroke Mother   . Heart disease Father   . Hyperlipidemia Father   . Hypertension Father   . Cancer Maternal Aunt    Social History  Substance Use Topics  . Smoking status: Current Some Day Smoker -- 1.00 packs/day for 42 years    Types: Cigarettes  . Smokeless tobacco: Never Used  . Alcohol Use: No   OB History    No data available     Review of Systems  Unable to perform ROS: Unstable vital signs      Allergies  Ceclor; Escitalopram oxalate; Sertraline hcl; and Sulfa drugs cross reactors  Home Medications   Prior to Admission medications   Medication Sig Start Date End Date Taking? Authorizing Provider  albuterol (PROVENTIL HFA;VENTOLIN HFA) 108 (90 BASE) MCG/ACT inhaler Inhale 2 puffs into the lungs every 6 (six) hours as needed for wheezing or shortness of breath. 04/16/15  Yes Brand Males, MD  albuterol (PROVENTIL) (2.5 MG/3ML) 0.083% nebulizer solution Take 3 mLs (2.5 mg total) by nebulization every 3 (three) hours as needed for wheezing or shortness of breath. 03/14/15  Yes Brand Males, MD  ALPRAZolam Duanne Moron) 0.5 MG tablet Take 0.5 mg by mouth at bedtime.    Yes Historical Provider, MD  Calcium Carbonate-Vitamin D (CALCIUM-VITAMIN D) 500-200  MG-UNIT per tablet Take 1 tablet by mouth daily.   Yes Historical Provider, MD  divalproex (DEPAKOTE ER) 500 MG 24 hr tablet Take 500 mg by mouth at bedtime.  02/22/12  Yes Historical Provider, MD  fluticasone (FLONASE) 50 MCG/ACT nasal spray Place 2 sprays into both nostrils daily. 06/06/15  Yes Brand Males, MD  furosemide (LASIX) 20 MG tablet TAKE 2 TABLETS BY MOUTH EVERY DAY 07/30/15  Yes [name], MD  guaiFENesin (MUCINEX) 600 MG 12 hr tablet Take 600 mg by mouth 2 (two) times daily as needed for cough or to loosen phlegm.    Yes Historical Provider, MD  ibuprofen (ADVIL,MOTRIN) 200 MG tablet Take 1,000 mg by mouth every 4 (four) hours as needed for fever,  headache, mild pain, moderate pain or cramping.    Yes Historical Provider, MD  ipratropium (ATROVENT) 0.03 % nasal spray Place 2 sprays into both nostrils 2 (two) times daily. 07/16/15  Yes Wardell Honour, MD  losartan-hydrochlorothiazide (HYZAAR) 50-12.5 MG per tablet TAKE 0.5 TABLETS BY MOUTH DAILY. 05/25/15  Yes [name], MD  Multiple Vitamin (MULTIVITAMIN) tablet Take 1 tablet by mouth daily.   Yes Historical Provider, MD  nadolol (CORGARD) 40 MG tablet TAKE 1/2 TABLET BY MOUTH EVERY DAY 04/25/15  Yes [name], MD  nortriptyline (PAMELOR) 50 MG capsule Take 100 mg by mouth at bedtime.   Yes Historical Provider, MD  Umeclidinium-Vilanterol (ANORO ELLIPTA) 62.5-25 MCG/INH AEPB Inhale 1 puff into the lungs daily.   Yes Historical Provider, MD  benzonatate (TESSALON) 100 MG capsule TAKE 1 TO 2 CAPSULES BY MOUTH 3 TIMES A DAY AS NEEDED FOR COUGH Patient not taking: Reported on 08/16/2015 06/27/15   Wardell Honour, MD  doxycycline (VIBRAMYCIN) 100 MG capsule Take 1 capsule (100 mg total) by mouth 2 (two) times daily. Patient not taking: Reported on 08/16/2015 07/05/15   Wardell Honour, MD  furosemide (LASIX) 20 MG tablet TAKE 2 TABLETS BY MOUTH EVERY DAY Patient not taking: Reported on 08/16/2015 05/25/15   [name], MD  HYDROcodone-acetaminophen (NORCO/VICODIN) 5-325 MG per tablet Take 1 tablet by mouth every 6 (six) hours as needed for moderate pain. Patient not taking: Reported on 08/16/2015 07/02/15   Shawnee Knapp, MD  potassium chloride SA (K-DUR,KLOR-CON) 20 MEQ tablet Take 1 tablet (20 mEq total) by mouth daily. Patient not taking: Reported on 08/16/2015 07/10/15   Wardell Honour, MD  tiZANidine (ZANAFLEX) 4 MG tablet Take 1 tablet (4 mg total) by mouth every 6 (six) hours as needed for muscle spasms. Patient not taking: Reported on 08/16/2015 07/05/15   Wardell Honour, MD   BP 84/50 mmHg  Pulse 67  Temp(Src) 98 F (36.7 C) (Oral)  Resp 16  SpO2 95% Physical Exam  Constitutional:  She is oriented to person, place, and time.  Ill appearing alert Glasgow Coma Score 15  HENT:  Head: Normocephalic and atraumatic.  Mucous membranes dry  Eyes: Conjunctivae are normal. Pupils are equal, round, and reactive to light.  Neck: Neck supple. No tracheal deviation present. No thyromegaly present.  Cardiovascular: Normal rate and regular rhythm.   No murmur heard. Pulmonary/Chest: Effort normal.  Rales at left base, diminished breath sounds right base  Abdominal: Soft. Bowel sounds are normal. She exhibits no distension. There is no tenderness.  Musculoskeletal: Normal range of motion. She exhibits no edema or tenderness.  Neurological: She is alert and oriented to person, place, and time. Coordination normal.  Skin: Skin is warm and  dry. No rash noted.  Psychiatric: She has a normal mood and affect.  Nursing note and vitals reviewed.   ED Course  Procedures (including critical care time) Labs Review Labs Reviewed  BASIC METABOLIC PANEL - Abnormal; Notable for the following:    Sodium 119 (*)    Potassium 3.0 (*)    Chloride 77 (*)    Glucose, Bld 121 (*)    Calcium 7.4 (*)    All other components within normal limits  CBC - Abnormal; Notable for the following:    WBC 14.7 (*)    RBC 3.57 (*)    Hemoglobin 10.7 (*)    HCT 30.2 (*)    All other components within normal limits  CULTURE, BLOOD (ROUTINE X 2)  CULTURE, BLOOD (ROUTINE X 2)  URINE CULTURE  URINALYSIS, ROUTINE W REFLEX MICROSCOPIC (NOT AT [facility])  CK  I-STAT CG4 LACTIC ACID, ED    Imaging Review Dg Chest 2 View  08/16/2015   CLINICAL DATA:  Dizziness  EXAM: CHEST  2 VIEW  COMPARISON:  10/2015  FINDINGS: Consolidation in the right middle and lower lobes is worse. Normal heart size. Left lung is clear. No pneumothorax. No pleural effusion.  IMPRESSION: Worsening right middle and lower lobe consolidation. Consider atypical organism or underlying malignancy.   Electronically Signed   By: Marybelle Killings M.D.    On: 08/16/2015 12:10   I have personally reviewed and evaluated these images and lab results as part of my medical decision-making.   EKG Interpretation   Date/Time:  Thursday August 16 2015 10:45:17 EDT Ventricular Rate:  71 PR Interval:  196 QRS Duration: 92 QT Interval:  444 QTC Calculation: 482 R Axis:   60 Text Interpretation:  Normal sinus rhythm Possible Left atrial enlargement  Cannot rule out Anterior infarct , age undetermined Abnormal ECG No  significant change since last tracing Confirmed by Winfred Leeds  MD, Annastasia Haskins  [phone]) on 08/16/2015 10:56:47 AM     Chest x-ray viewed by me Results for orders placed or performed during the hospital encounter of 42/35/36  Basic metabolic panel  Result Value Ref Range   Sodium 119 (LL) 135 - 145 mmol/L   Potassium 3.0 (L) 3.5 - 5.1 mmol/L   Chloride 77 (L) 101 - 111 mmol/L   CO2 32 22 - 32 mmol/L   Glucose, Bld 121 (H) 65 - 99 mg/dL   BUN 14 6 - 20 mg/dL   Creatinine, Ser 0.73 0.44 - 1.00 mg/dL   Calcium 7.4 (L) 8.9 - 10.3 mg/dL   GFR calc non Af Amer >60 >60 mL/min   GFR calc Af Amer >60 >60 mL/min   Anion gap 10 5 - 15  CBC  Result Value Ref Range   WBC 14.7 (H) 4.0 - 10.5 K/uL   RBC 3.57 (L) 3.87 - 5.11 MIL/uL   Hemoglobin 10.7 (L) 12.0 - 15.0 g/dL   HCT 30.2 (L) 36.0 - 46.0 %   MCV 84.6 78.0 - 100.0 fL   MCH 30.0 26.0 - 34.0 pg   MCHC 35.4 30.0 - 36.0 g/dL   RDW 13.4 11.5 - 15.5 %   Platelets 232 150 - 400 K/uL  Urinalysis, Routine w reflex microscopic (not at [facility])  Result Value Ref Range   Color, Urine YELLOW YELLOW   APPearance CLOUDY (A) CLEAR   Specific Gravity, Urine 1.005 1.005 - 1.030   pH 5.0 5.0 - 8.0   Glucose, UA NEGATIVE NEGATIVE mg/dL   Hgb urine dipstick  NEGATIVE NEGATIVE   Bilirubin Urine NEGATIVE NEGATIVE   Ketones, ur NEGATIVE NEGATIVE mg/dL   Protein, ur NEGATIVE NEGATIVE mg/dL   Urobilinogen, UA 0.2 0.0 - 1.0 mg/dL   Nitrite POSITIVE (A) NEGATIVE   Leukocytes, UA TRACE (A) NEGATIVE  CK   Result Value Ref Range   Total CK 650 (H) 38 - 234 U/L  Urine microscopic-add on  Result Value Ref Range   Squamous Epithelial / LPF RARE RARE   WBC, UA 0-2 <3 WBC/hpf   Bacteria, UA MANY (A) RARE   Urine-Other LESS THAN 10 mL OF URINE SUBMITTED   I-Stat CG4 Lactic Acid, ED  (not at  [facility])  Result Value Ref Range   Lactic Acid, Venous 1.14 0.5 - 2.0 mmol/L  chest xray viewed by me Dg Chest 2 View  08/16/2015   CLINICAL DATA:  Dizziness  EXAM: CHEST  2 VIEW  COMPARISON:  10/2015  FINDINGS: Consolidation in the right middle and lower lobes is worse. Normal heart size. Left lung is clear. No pneumothorax. No pleural effusion.  IMPRESSION: Worsening right middle and lower lobe consolidation. Consider atypical organism or underlying malignancy.   Electronically Signed   By: Marybelle Killings M.D.   On: 08/16/2015 12:10    3 PM patient is alert and talkative and looks improved after treatment with intravenous fluids and intravenous antibiotics MDM   code sepsis calledSpoke with Dr,. Feliz. Plan admit stepdiown unit  Iv antibiotics, iv fluids Final diagnoses:  None   Dx #1 SevereSepsis #2 community acquired pneumonia #3 hyponatremia #4 hypokalemia #5uti #6 rhabdomyolysis CRITICAL CARE Performed by: Orlie Dakin Total critical care time: 40 minute Critical care time was exclusive of separately billable procedures and treating other patients. Critical care was necessary to treat or prevent imminent or life-threatening deterioration. Critical care was time spent personally by me on the following activities: development of treatment plan with patient and/or surrogate as well as nursing, discussions with consultants, evaluation of patient's response to treatment, examination of patient, obtaining history from patient or surrogate, ordering and performing treatments and interventions, ordering and review of laboratory studies, ordering and review of radiographic studies, pulse oximetry and  re-evaluation of patient's condition.    Orlie Dakin, MD 08/16/15 [place], MD 08/16/15 [id number]

## 2015-08-16 NOTE — H&P (Signed)
[facility] History and Physical  BRIDGITT RAGGIO WGY:[id number] DOB: 1951/07/29 DOA: 08/16/2015  Referring physician: Dr. Shellee Milo PCP: [name], MD   Chief Complaint: Cough and generalized weakness  HPI: Catherine Kelley is a 64 y.o. female with past medical history of hypertension was admitted about a year ago for a right lower lobe pneumonia with septic shock extensive workup over etiology at that time was unrevealing, but she did had a CT scan that showed pneumonia with necrotizing features since then she has failed multiple antibiotic treatments without ever as per patient getting better. She been seen by pulmonary and critical care last visit about 2 months ago and they recommended a bronchoscopy and probable lobectomy. Today she comes in due to often generalized weakness for 4 days she relates she has fallen to the floor and has not been able to get up the last 3 days and her husband helps her up when he returns from work. She relates her cough is productive green-yellow and sputum. With recurrent fevers. She denies any sick contacts any new medications.  In the ED: She was found to be hypotensive hyponatremic with leukocytosis a CK of 650 lactic acid 1.1, she was started empirically on antibiotics and blood cultures were drawn chest x-ray shows a right lower lobe pneumonia were consulted for further evaluation.   Review of Systems:  Constitutional:  No weight loss,  chills, fatigue.  HEENT:  No headaches, Difficulty swallowing,Tooth/dental problems,Sore throat,  No sneezing, itching, ear ache, nasal congestion, post nasal drip,  Cardio-vascular:  No chest pain, Orthopnea, PND, swelling in lower extremities, anasarca, dizziness, palpitations  GI:  No heartburn, indigestion, abdominal pain, nausea, vomiting, diarrhea, change in bowel habits, loss of appetite  Resp:  No wheezing.No chest wall deformity  Skin:  no rash or lesions.  GU:  no dysuria, change in color of urine, no  urgency or frequency. No flank pain.  Musculoskeletal:  No joint pain or swelling. No decreased range of motion. No back pain.  Psych:  No change in mood or affect. No depression or anxiety. No memory loss.   Past Medical History  Diagnosis Date  . Hypertension     essential hypertension  . Tobacco abuse     smoking about 1/2 pk of cigarettes a day  . Anxiety   . Depression     psychiatrist Dr. Toy Care  . Respiratory infection   . Shortness of breath   . Malaise   . Lightheadedness   . Emphysematous COPD     changes in right base along with patchy areas on last x-ray  . COPD (chronic obstructive pulmonary disease)   . History of echocardiogram 12/24/2006    Est. EF of 77-93% NORMAL LV SYSTOLIC FUNCTION WITH IMPAIRED RELAXATION -- MILD AORTIC SCLEROSIS -- NORMAL PALONARY ARTERY PRESSURE -- NO OLD ECHOS FOR COMPARISON -- [name], MD  . History of cardiovascular stress test 08/15/2004    EF of 70% -- Normal stress cardiolite.  There is no evidence of ischemia and there is normal LV function. -- [name]. MD  . Diastolic dysfunction   . Allergy   . Asthma   . Heart murmur   . Pneumonia     "several times since May 2015" (07/06/2014)  . Chronic bronchitis     "get it alot; maybe not q yr" (07/06/2014)   Past Surgical History  Procedure Laterality Date  . Tubal ligation  1986   Social History:  reports that she has been smoking  Cigarettes.  She has a 42 pack-year smoking history. She has never used smokeless tobacco. She reports that she does not drink alcohol or use illicit drugs.  Allergies  Allergen Reactions  . Ceclor [Cefaclor] Hives  . Escitalopram Oxalate     Pt does not recall ever taking medication  . Sertraline Hcl     UNKNOWN  . Sulfa Drugs Cross Reactors Hives and Rash    Hives and rash    Family History  Problem Relation Age of Onset  . Heart disease Mother   . Stroke Mother   . Heart disease Father   . Hyperlipidemia Father   . Hypertension  Father   . Cancer Maternal Aunt     Prior to Admission medications   Medication Sig Start Date End Date Taking? Authorizing Provider  albuterol (PROVENTIL HFA;VENTOLIN HFA) 108 (90 BASE) MCG/ACT inhaler Inhale 2 puffs into the lungs every 6 (six) hours as needed for wheezing or shortness of breath. 04/16/15  Yes Brand Males, MD  albuterol (PROVENTIL) (2.5 MG/3ML) 0.083% nebulizer solution Take 3 mLs (2.5 mg total) by nebulization every 3 (three) hours as needed for wheezing or shortness of breath. 03/14/15  Yes Brand Males, MD  ALPRAZolam Duanne Moron) 0.5 MG tablet Take 0.5 mg by mouth at bedtime.    Yes Historical Provider, MD  Calcium Carbonate-Vitamin D (CALCIUM-VITAMIN D) 500-200 MG-UNIT per tablet Take 1 tablet by mouth daily.   Yes Historical Provider, MD  divalproex (DEPAKOTE ER) 500 MG 24 hr tablet Take 500 mg by mouth at bedtime.  02/22/12  Yes Historical Provider, MD  fluticasone (FLONASE) 50 MCG/ACT nasal spray Place 2 sprays into both nostrils daily. 06/06/15  Yes Brand Males, MD  furosemide (LASIX) 20 MG tablet TAKE 2 TABLETS BY MOUTH EVERY DAY 07/30/15  Yes [name], MD  guaiFENesin (MUCINEX) 600 MG 12 hr tablet Take 600 mg by mouth 2 (two) times daily as needed for cough or to loosen phlegm.    Yes Historical Provider, MD  ibuprofen (ADVIL,MOTRIN) 200 MG tablet Take 1,000 mg by mouth every 4 (four) hours as needed for fever, headache, mild pain, moderate pain or cramping.    Yes Historical Provider, MD  ipratropium (ATROVENT) 0.03 % nasal spray Place 2 sprays into both nostrils 2 (two) times daily. 07/16/15  Yes Wardell Honour, MD  losartan-hydrochlorothiazide (HYZAAR) 50-12.5 MG per tablet TAKE 0.5 TABLETS BY MOUTH DAILY. 05/25/15  Yes [name], MD  Multiple Vitamin (MULTIVITAMIN) tablet Take 1 tablet by mouth daily.   Yes Historical Provider, MD  nadolol (CORGARD) 40 MG tablet TAKE 1/2 TABLET BY MOUTH EVERY DAY 04/25/15  Yes [name], MD  nortriptyline  (PAMELOR) 50 MG capsule Take 100 mg by mouth at bedtime.   Yes Historical Provider, MD  Umeclidinium-Vilanterol (ANORO ELLIPTA) 62.5-25 MCG/INH AEPB Inhale 1 puff into the lungs daily.   Yes Historical Provider, MD  benzonatate (TESSALON) 100 MG capsule TAKE 1 TO 2 CAPSULES BY MOUTH 3 TIMES A DAY AS NEEDED FOR COUGH Patient not taking: Reported on 08/16/2015 06/27/15   Wardell Honour, MD  doxycycline (VIBRAMYCIN) 100 MG capsule Take 1 capsule (100 mg total) by mouth 2 (two) times daily. Patient not taking: Reported on 08/16/2015 07/05/15   Wardell Honour, MD  furosemide (LASIX) 20 MG tablet TAKE 2 TABLETS BY MOUTH EVERY DAY Patient not taking: Reported on 08/16/2015 05/25/15   [name], MD  HYDROcodone-acetaminophen (NORCO/VICODIN) 5-325 MG per tablet Take 1 tablet by mouth every 6 (six)  hours as needed for moderate pain. Patient not taking: Reported on 08/16/2015 07/02/15   Shawnee Knapp, MD  potassium chloride SA (K-DUR,KLOR-CON) 20 MEQ tablet Take 1 tablet (20 mEq total) by mouth daily. Patient not taking: Reported on 08/16/2015 07/10/15   Wardell Honour, MD  tiZANidine (ZANAFLEX) 4 MG tablet Take 1 tablet (4 mg total) by mouth every 6 (six) hours as needed for muscle spasms. Patient not taking: Reported on 08/16/2015 07/05/15   Wardell Honour, MD   Physical Exam: Filed Vitals:   08/16/15 1336 08/16/15 1345 08/16/15 1400 08/16/15 1415  BP:  '93/39 89/38 90/43 '$  Pulse:  73 72 70  Temp:      TempSrc:      Resp:      Weight: 63.957 kg (141 lb)     SpO2:  98% 97% 93%    Wt Readings from Last 3 Encounters:  08/16/15 63.957 kg (141 lb)  07/12/15 64.864 kg (143 lb)  07/10/15 64.32 kg (141 lb 12.8 oz)    General:  Appears calm and comfortable Eyes: PERRL, normal lids, irises & conjunctiva ENT: grossly normal hearing, lips & tongue Neck: no LAD, masses or thyromegaly Cardiovascular: RRR, no m/r/g. No LE edema. Telemetry: SR, no arrhythmias  Respiratory: CTA bilaterally, with decreased breath  sounds on the right lower lobe and crackles Abdomen: soft, ntnd Skin: no rash or induration seen on limited exam Musculoskeletal: grossly normal tone BUE/BLE Psychiatric: grossly normal mood and affect, speech fluent and appropriate Neurologic: grossly non-focal.          Labs on Admission:  Basic Metabolic Panel:  Recent Labs Lab 08/16/15 1100  NA 119*  K 3.0*  CL 77*  CO2 32  GLUCOSE 121*  BUN 14  CREATININE 0.73  CALCIUM 7.4*   Liver Function Tests: No results for input(s): AST, ALT, ALKPHOS, BILITOT, PROT, ALBUMIN in the last 168 hours. No results for input(s): LIPASE, AMYLASE in the last 168 hours. No results for input(s): AMMONIA in the last 168 hours. CBC:  Recent Labs Lab 08/16/15 1100  WBC 14.7*  HGB 10.7*  HCT 30.2*  MCV 84.6  PLT 232   Cardiac Enzymes:  Recent Labs Lab 08/16/15 1256  CKTOTAL 650*    BNP (last 3 results) No results for input(s): BNP in the last 8760 hours.  ProBNP (last 3 results) No results for input(s): PROBNP in the last 8760 hours.  CBG: No results for input(s): GLUCAP in the last 168 hours.  Radiological Exams on Admission: Dg Chest 2 View  08/16/2015   CLINICAL DATA:  Dizziness  EXAM: CHEST  2 VIEW  COMPARISON:  10/2015  FINDINGS: Consolidation in the right middle and lower lobes is worse. Normal heart size. Left lung is clear. No pneumothorax. No pleural effusion.  IMPRESSION: Worsening right middle and lower lobe consolidation. Consider atypical organism or underlying malignancy.   Electronically Signed   By: Marybelle Killings M.D.   On: 08/16/2015 12:10    EKG: Independently reviewed. Sinus rhythm normal axis. QT  Assessment/Plan Severe sepsis due to CAP (community acquired pneumonia)/  Acute respiratory failure with hypoxia: - I agree with bringing her to step down, I will continue aggressive IV fluid hydration, urine is significantly concentrated. She is in septic shock despite her lactic acid being less than 2. I do  not know if we are dealing here with a resistant organism, but the patient is a poor historian and she is on 2 diuretics at home, so there  is also a concern for poor compliance with her antibiotic regimen. I will go ahead and get a CT scan of the chest, will consult pulmonary for possible bronchoscopy and question lobectomy as referred by one of the pulmonary physicians in his office notes. - We will brought in her antibiotic course to vancomycin amine imipenem. - We will get blood cultures and sputum cultures. - She is on 2 diuretics which can be controlled into her hypotension we'll DC both of them and hold her antihypertensive medication. - Monitor strict I's and O's.  Moderate to severe hyponatremia: This probably due to hypovolemia started on aggressive IV fluid hydration and recheck in the morning. Check urinary sodium and urinary creatinine and urine and serum osmolarity.  Benign hypertensive heart disease without heart failure: I will hold her antihypertensive medication.  Rhabdomyolysis: Less than 5000 is probably from laying on the floor, will treated IV hydration. Her kidney function is at baseline.    Code Status: full DVT Prophylaxis:heparin Family Communication: none Disposition Plan: inpatient  Time spent: 75 min  Charlynne Cousins [facility] Pager [phone]

## 2015-08-16 NOTE — Progress Notes (Signed)
ANTIBIOTIC CONSULT NOTE - INITIAL  Pharmacy Consult for Ceftriaxone, Azithromycin Indication: CAP  Allergies  Allergen Reactions  . Ceclor [Cefaclor] Hives  . Escitalopram Oxalate     Pt does not recall ever taking medication  . Sertraline Hcl     UNKNOWN  . Sulfa Drugs Cross Reactors Hives and Rash    Hives and rash    Patient Measurements:   Height: 5'3" Weight (as of 07/12/15): 64.9 kg  Vital Signs: Temp: 98 F (36.7 C) (09/15 1232) Temp Source: Oral (09/15 1232) BP: 84/50 mmHg (09/15 1251) Pulse Rate: 67 (09/15 1232) Intake/Output from previous day:   Intake/Output from this shift:    Labs:  Recent Labs  08/16/15 1100  WBC 14.7*  HGB 10.7*  PLT 232  CREATININE 0.73   CrCl cannot be calculated (Unknown ideal weight.). No results for input(s): VANCOTROUGH, VANCOPEAK, VANCORANDOM, GENTTROUGH, GENTPEAK, GENTRANDOM, TOBRATROUGH, TOBRAPEAK, TOBRARND, AMIKACINPEAK, AMIKACINTROU, AMIKACIN in the last 72 hours.   Microbiology: No results found for this or any previous visit (from the past 720 hour(s)).  Medical History: Past Medical History  Diagnosis Date  . Hypertension     essential hypertension  . Tobacco abuse     smoking about 1/2 pk of cigarettes a day  . Anxiety   . Depression     psychiatrist Dr. Toy Care  . Respiratory infection   . Shortness of breath   . Malaise   . Lightheadedness   . Emphysematous COPD     changes in right base along with patchy areas on last x-ray  . COPD (chronic obstructive pulmonary disease)   . History of echocardiogram 12/24/2006    Est. EF of 95-09% NORMAL LV SYSTOLIC FUNCTION WITH IMPAIRED RELAXATION -- MILD AORTIC SCLEROSIS -- NORMAL PALONARY ARTERY PRESSURE -- NO OLD ECHOS FOR COMPARISON -- [name], MD  . History of cardiovascular stress test 08/15/2004    EF of 70% -- Normal stress cardiolite.  There is no evidence of ischemia and there is normal LV function. -- [name]. MD  . Diastolic dysfunction    . Allergy   . Asthma   . Heart murmur   . Pneumonia     "several times since May 2015" (07/06/2014)  . Chronic bronchitis     "get it alot; maybe not q yr" (07/06/2014)    Assessment: 8 y/oF with PMH of HTN, anxiety, depression, COPD, asthma who presents with complaints of generalized weakness and productive cough of green and yellow sputum for several weeks. CXR shows worsening of right middle and lower lobe consolidation. Pharmacy consulted to dose Ceftriaxone and Azithromycin for CAP. Of note, patient reports allergy to Cefaclor, but has tolerated Ceftriaxone in 07/2015.   9/15 >> Ceftriaxone >> 9/15 >> Azithromycin >>    9/15 blood x 2: sent 9/15 urine: sent   Goal of Therapy:  Appropriate antibiotic dosing for indication/renal function Eradication of infection  Plan:   Ceftriaxone 1g IV q24h x 7 days.  Azithromycin '500mg'$  IV q24h x 7 days.  Convert to PO antibiotics when clinically appropriate.  No dose adjustments required, so pharmacy will sign off.   Thank you for the consult.  Lindell Spar, PharmD, BCPS Pager: [phone] 08/16/2015 1:25 PM

## 2015-08-16 NOTE — Progress Notes (Signed)
Utilization Review completed.  Mourad Cwikla RN CM  

## 2015-08-17 ENCOUNTER — Inpatient Hospital Stay (HOSPITAL_COMMUNITY): Payer: Managed Care, Other (non HMO)

## 2015-08-17 ENCOUNTER — Encounter (HOSPITAL_COMMUNITY): Payer: Self-pay | Admitting: Pulmonary Disease

## 2015-08-17 DIAGNOSIS — J439 Emphysema, unspecified: Secondary | ICD-10-CM

## 2015-08-17 DIAGNOSIS — K219 Gastro-esophageal reflux disease without esophagitis: Secondary | ICD-10-CM

## 2015-08-17 LAB — CBC
HCT: 29.7 % — ABNORMAL LOW (ref 36.0–46.0)
HEMOGLOBIN: 9.9 g/dL — AB (ref 12.0–15.0)
MCH: 29 pg (ref 26.0–34.0)
MCHC: 33.3 g/dL (ref 30.0–36.0)
MCV: 87.1 fL (ref 78.0–100.0)
Platelets: 203 10*3/uL (ref 150–400)
RBC: 3.41 MIL/uL — ABNORMAL LOW (ref 3.87–5.11)
RDW: 13.5 % (ref 11.5–15.5)
WBC: 8.6 10*3/uL (ref 4.0–10.5)

## 2015-08-17 LAB — BASIC METABOLIC PANEL
Anion gap: 5 (ref 5–15)
BUN: 7 mg/dL (ref 6–20)
CALCIUM: 7.7 mg/dL — AB (ref 8.9–10.3)
CHLORIDE: 95 mmol/L — AB (ref 101–111)
CO2: 31 mmol/L (ref 22–32)
CREATININE: 0.39 mg/dL — AB (ref 0.44–1.00)
GFR calc Af Amer: >60 mL/min (ref >60)
GFR calc non Af Amer: >60 mL/min (ref >60)
Glucose, Bld: 129 mg/dL — ABNORMAL HIGH (ref 65–99)
Potassium: 2.7 mmol/L — CL (ref 3.5–5.1)
SODIUM: 131 mmol/L — AB (ref 135–145)

## 2015-08-17 LAB — MAGNESIUM: MAGNESIUM: 1.6 mg/dL — AB (ref 1.7–2.4)

## 2015-08-17 LAB — HEMOGLOBIN A1C
HEMOGLOBIN A1C: 5.7 % — AB (ref 4.8–5.6)
Mean Plasma Glucose: 117 mg/dL

## 2015-08-17 LAB — GLUCOSE, CAPILLARY
Glucose-Capillary: 136 mg/dL — ABNORMAL HIGH (ref 65–99)
Glucose-Capillary: 145 mg/dL — ABNORMAL HIGH (ref 65–99)
Glucose-Capillary: 100 mg/dL — ABNORMAL HIGH (ref 65–99)

## 2015-08-17 LAB — OSMOLALITY, URINE: Osmolality, Ur: 112 mOsm/kg — ABNORMAL LOW (ref 390–1090)

## 2015-08-17 LAB — HIV ANTIBODY (ROUTINE TESTING W REFLEX): HIV SCREEN 4TH GENERATION: NONREACTIVE

## 2015-08-17 MED ORDER — POTASSIUM CHLORIDE CRYS ER 20 MEQ PO TBCR
40.0000 meq | EXTENDED_RELEASE_TABLET | Freq: Three times a day (TID) | ORAL | Status: AC
  Administered 2015-08-17 (×3): 40 meq via ORAL
  Filled 2015-08-17 (×3): qty 2

## 2015-08-17 MED ORDER — POTASSIUM CHLORIDE 10 MEQ/100ML IV SOLN
10.0000 meq | INTRAVENOUS | Status: AC
  Administered 2015-08-17 (×2): 10 meq via INTRAVENOUS
  Filled 2015-08-17 (×2): qty 100

## 2015-08-17 MED ORDER — FAMOTIDINE 20 MG PO TABS
20.0000 mg | ORAL_TABLET | Freq: Every day | ORAL | Status: DC
  Administered 2015-08-17 – 2015-08-20 (×4): 20 mg via ORAL
  Filled 2015-08-17 (×5): qty 1

## 2015-08-17 MED ORDER — SENNA 8.6 MG PO TABS
1.0000 | ORAL_TABLET | Freq: Every day | ORAL | Status: DC
  Administered 2015-08-17 – 2015-08-21 (×4): 8.6 mg via ORAL
  Filled 2015-08-17 (×5): qty 1

## 2015-08-17 MED ORDER — IPRATROPIUM-ALBUTEROL 0.5-2.5 (3) MG/3ML IN SOLN
3.0000 mL | RESPIRATORY_TRACT | Status: DC
  Administered 2015-08-17 – 2015-08-18 (×7): 3 mL via RESPIRATORY_TRACT
  Filled 2015-08-17 (×7): qty 3

## 2015-08-17 MED ORDER — ALBUTEROL SULFATE (2.5 MG/3ML) 0.083% IN NEBU: 2.5000 mg | INHALATION_SOLUTION | RESPIRATORY_TRACT | Status: DC | PRN

## 2015-08-17 NOTE — Progress Notes (Signed)
[facility] PROGRESS NOTE  Assessment/Plan: Severe sepsis without septic shock/Acute respiratory failure with hypoxia due to CAP (community acquired pneumonia): - Started empirically on vancomycin and imipenem on 08/16/2015 blood pressure has remained stable, leukocytosis is improved. - DCT  scan of the chest without contrast is pending pulmonary recommendations for possible bronchoscopies are pending. Her wheezing has resolved continue IV steroids. - The cultures remain negative, the patient was caught with Vicodin in her room trying to take it.  - transfer to regular floor, BP has remain stable.  Rhabdomyolysis - Resolved with IV hydration.  Hyponatremia - Likely due to hypovolemia improving with IV hydration.  Benign hypertensive heart disease without heart failure Cont to hold antihypertensive medications.    Code Status: full Family Communication: none  Disposition Plan: inpatient home in 5-6 days, transfer to regular floor.   Consultants:  Pulmonayr  Procedures:  CT chest  Antibiotics: Vancomycin and imipenem 9.16.2016  Subjective: Relates she feels better cough has improved.  Objective: Filed Vitals:   08/16/15 2000 08/17/15 0000 08/17/15 0400 08/17/15 0429  BP: 89/44   91/47  Pulse:    69  Temp: 97.9 F (36.6 C) 98.2 F (36.8 C) 97.6 F (36.4 C)   TempSrc: Oral Oral Oral   Resp:    21  Height:      Weight:      SpO2:    97%    Intake/Output Summary (Last 24 hours) at 08/17/15 0741 Last data filed at 08/17/15 0651  Gross per 24 hour  Intake 6491.67 ml  Output    800 ml  Net 5691.67 ml   Filed Weights   08/16/15 1336 08/16/15 1708  Weight: 63.957 kg (141 lb) 65.7 kg (144 lb 13.5 oz)    Exam:  General: Alert, awake, oriented x3, in no acute distress.  HEENT: No bruits, no goiter.  Heart: Regular rate and rhythm. Lungs: Good air movement, no wheezing. Abdomen: Soft, nontender, nondistended, positive bowel sounds.  Neuro:  Grossly intact, nonfocal.   Data Reviewed: Basic Metabolic Panel:  Recent Labs Lab 08/16/15 1100 08/16/15 1954 08/16/15 2230 08/17/15 0413  NA 119* 130*  --  131*  K 3.0* 2.6*  --  2.7*  CL 77* 92*  --  95*  CO2 32 30  --  31  GLUCOSE 121* 159*  --  129*  BUN 14 8  --  7  CREATININE 0.73 0.40*  --  0.39*  CALCIUM 7.4* 7.4*  --  7.7*  MG  --   --  1.6*  --    Liver Function Tests: No results for input(s): AST, ALT, ALKPHOS, BILITOT, PROT, ALBUMIN in the last 168 hours. No results for input(s): LIPASE, AMYLASE in the last 168 hours. No results for input(s): AMMONIA in the last 168 hours. CBC:  Recent Labs Lab 08/16/15 1100 08/17/15 0413  WBC 14.7* 8.6  HGB 10.7* 9.9*  HCT 30.2* 29.7*  MCV 84.6 87.1  PLT 232 203   Cardiac Enzymes:  Recent Labs Lab 08/16/15 1256  CKTOTAL 650*   BNP (last 3 results) No results for input(s): BNP in the last 8760 hours.  ProBNP (last 3 results) No results for input(s): PROBNP in the last 8760 hours.  CBG:  Recent Labs Lab 08/16/15 2132  GLUCAP 146*    Recent Results (from the past 240 hour(s))  MRSA PCR Screening     Status: None   Collection Time: 08/16/15  5:06 PM  Result Value Ref Range Status  MRSA by PCR NEGATIVE NEGATIVE Final    Comment:        The GeneXpert MRSA Assay (FDA approved for NASAL specimens only), is one component of a comprehensive MRSA colonization surveillance program. It is not intended to diagnose MRSA infection nor to guide or monitor treatment for MRSA infections.   Culture, sputum-assessment     Status: None   Collection Time: 08/16/15  7:17 PM  Result Value Ref Range Status   Specimen Description SPUTUM  Final   Special Requests NONE  Final   Sputum evaluation   Final    THIS SPECIMEN IS ACCEPTABLE. RESPIRATORY CULTURE REPORT TO FOLLOW.   Report Status 08/16/2015 FINAL  Final     Studies: Dg Chest 2 View  08/16/2015   CLINICAL DATA:  Dizziness  EXAM: CHEST  2 VIEW   COMPARISON:  10/2015  FINDINGS: Consolidation in the right middle and lower lobes is worse. Normal heart size. Left lung is clear. No pneumothorax. No pleural effusion.  IMPRESSION: Worsening right middle and lower lobe consolidation. Consider atypical organism or underlying malignancy.   Electronically Signed   By: Marybelle Killings M.D.   On: 08/16/2015 12:10   Ct Chest Wo Contrast  08/17/2015   CLINICAL DATA:  Pneumonia with productive cough and fevers  EXAM: CT CHEST WITHOUT CONTRAST  TECHNIQUE: Multidetector CT imaging of the chest was performed following the standard protocol without IV contrast.  COMPARISON:  Chest radiograph August 16, 2015; chest CT February 07, 2015  FINDINGS: There is extensive airspace consolidation throughout most of the right middle and lower lobes. There is ground-glass type opacity throughout much of the anterior segment of the right upper lobe as well as portions of the apical segment right upper lobe, also felt to be consistent with pneumonia. On the left, there is mild posterior basilar atelectasis. No consolidation is seen on the left.  There is atherosclerotic change in the aorta but no aneurysm. There is mild atherosclerotic calcification in the visualized great vessels. Visualized thyroid appears unremarkable. There are a few mildly prominent lymph nodes. There is a lymph node just anterior to the carina measuring 1.7 x 1.6 cm. There is a sub- carinal lymph node measuring 1.9 x 1.8 cm.  There are scattered foci of coronary artery calcification. The pericardium is not thickened.  Visualized upper abdominal structures appear unremarkable except for atherosclerotic calcification in the upper abdominal aorta.  There are no blastic or lytic bone lesions.  IMPRESSION: Widespread airspace consolidation throughout the right middle and lower lobes. Ground-glass type opacity, felt to represent pneumonia, in portions of the anterior apical segments of the right upper lobe.  Mild adenopathy,  likely of reactive etiology given the degree of infiltrate present.  Scattered foci of coronary artery calcification.   Electronically Signed   By: Lowella Grip III M.D.   On: 08/17/2015 07:11    Scheduled Meds: . ALPRAZolam  0.5 mg Oral QHS  . divalproex  500 mg Oral QHS  . enoxaparin (LOVENOX) injection  40 mg Subcutaneous Q24H  . hydrocortisone sod succinate (SOLU-CORTEF) inj  50 mg Intravenous Q6H  . imipenem-cilastatin  500 mg Intravenous Q8H  . insulin aspart  0-9 Units Subcutaneous TID WC  . ipratropium  2 spray Each Nare BID  . nortriptyline  100 mg Oral QHS  . potassium chloride  10 mEq Intravenous Q1 Hr x 2  . potassium chloride  40 mEq Oral TID  . saccharomyces boulardii  250 mg Oral BID  .  sodium chloride  3 mL Intravenous Q12H  . vancomycin  750 mg Intravenous Q12H   Continuous Infusions: . sodium chloride 100 mL/hr at 08/16/15 1729    Time Spent: 25 min   Charlynne Cousins  [facility] Pager [phone]. If 7PM-7AM, please contact night-coverage at [email or web address], password [facility] 08/17/2015, 7:41 AM  LOS: 1 day

## 2015-08-17 NOTE — Consult Note (Addendum)
Name: Catherine Kelley MRN: [id number] DOB: 1951/06/04    ADMISSION DATE:  08/16/2015 CONSULTATION DATE:  08/17/2015  REFERRING MD :  Dr. Olevia Bowens  CHIEF COMPLAINT:  Pneumonia/Bronchiectasis  BRIEF PATIENT DESCRIPTION: 64 year old female with known underlying COPD & emphysema. Patient also has known history of tobacco use. She was admitted to hospital in August 2015 with necrotizing right lower lobe pneumonia and had prolonged course of antibiotics. Serum ANA was positive but rheumatoid factor negative. Has been unable to afford inhaler medications other than her nebulizer.  SIGNIFICANT EVENTS  August 2015 - Necrotizing RLL Pneumonia 9/15 - Admitted with RML & RLL Pneumonia  STUDIES:  CT Chest W/O 9/15: 2 opacification & consolidation most densely in the right lower lobe and also present within the right middle lobe. Some groundglass opacity within the right upper lobe as well. No appreciated pleural effusion. Patchy dilated esophagus the level of the cervical spine.  HISTORY OF PRESENT ILLNESS:  Patient reports that she came to the hospital after she had near syncopal event and kept falling. For approximately 1 week she's had a cough productive of a yellow-green mucus that has been increasing in severity and volume such that she doesn't times have urinary incontinence. She denies any chest pain or pressure. She denies any nausea or vomiting. She does have chronic intermittent GERD but denies any morning brash water taste. She denies any dysphagia or odynophagia but on further questioning does admit that she does have choking at times. She has had some subjective fever, chills, and sweats. She denies any hemoptysis. She was started on broad-spectrum antibiotics with vancomycin, Primaxin, & azithromycin on admission. She denies any joint pain, swelling, or stiffness. She denies any Raynaud's.  PAST MEDICAL HISTORY :  Past Medical History  Diagnosis Date  . Hypertension     essential hypertension  .  Tobacco abuse     smoking about 1/2 pk of cigarettes a day  . Anxiety   . Depression     psychiatrist Dr. Toy Care  . Respiratory infection   . Shortness of breath   . Malaise   . Lightheadedness   . Emphysematous COPD     changes in right base along with patchy areas on last x-ray  . COPD (chronic obstructive pulmonary disease)   . History of echocardiogram 12/24/2006    Est. EF of 48-01% NORMAL LV SYSTOLIC FUNCTION WITH IMPAIRED RELAXATION -- MILD AORTIC SCLEROSIS -- NORMAL PALONARY ARTERY PRESSURE -- NO OLD ECHOS FOR COMPARISON -- [name], MD  . History of cardiovascular stress test 08/15/2004    EF of 70% -- Normal stress cardiolite.  There is no evidence of ischemia and there is normal LV function. -- [name]. MD  . Diastolic dysfunction   . Allergy   . Asthma   . Heart murmur   . Pneumonia     "several times since May 2015" (07/06/2014)  . Chronic bronchitis     "get it alot; maybe not q yr" (07/06/2014)  . GERD (gastroesophageal reflux disease)     PAST SURGICAL HISTORY: Past Surgical History  Procedure Laterality Date  . Tubal ligation  1986   Prior to Admission medications   Medication Sig Start Date End Date Taking? Authorizing Provider  albuterol (PROVENTIL HFA;VENTOLIN HFA) 108 (90 BASE) MCG/ACT inhaler Inhale 2 puffs into the lungs every 6 (six) hours as needed for wheezing or shortness of breath. 04/16/15  Yes Brand Males, MD  albuterol (PROVENTIL) (2.5 MG/3ML) 0.083% nebulizer solution Take 3  mLs (2.5 mg total) by nebulization every 3 (three) hours as needed for wheezing or shortness of breath. 03/14/15  Yes Brand Males, MD  ALPRAZolam Duanne Moron) 0.5 MG tablet Take 0.5 mg by mouth at bedtime.    Yes Historical Provider, MD  Calcium Carbonate-Vitamin D (CALCIUM-VITAMIN D) 500-200 MG-UNIT per tablet Take 1 tablet by mouth daily.   Yes Historical Provider, MD  divalproex (DEPAKOTE ER) 500 MG 24 hr tablet Take 500 mg by mouth at bedtime.  02/22/12  Yes  Historical Provider, MD  fluticasone (FLONASE) 50 MCG/ACT nasal spray Place 2 sprays into both nostrils daily. 06/06/15  Yes Brand Males, MD  furosemide (LASIX) 20 MG tablet TAKE 2 TABLETS BY MOUTH EVERY DAY 07/30/15  Yes [name], MD  guaiFENesin (MUCINEX) 600 MG 12 hr tablet Take 600 mg by mouth 2 (two) times daily as needed for cough or to loosen phlegm.    Yes Historical Provider, MD  ibuprofen (ADVIL,MOTRIN) 200 MG tablet Take 1,000 mg by mouth every 4 (four) hours as needed for fever, headache, mild pain, moderate pain or cramping.    Yes Historical Provider, MD  ipratropium (ATROVENT) 0.03 % nasal spray Place 2 sprays into both nostrils 2 (two) times daily. 07/16/15  Yes Wardell Honour, MD  losartan-hydrochlorothiazide (HYZAAR) 50-12.5 MG per tablet TAKE 0.5 TABLETS BY MOUTH DAILY. 05/25/15  Yes [name], MD  Multiple Vitamin (MULTIVITAMIN) tablet Take 1 tablet by mouth daily.   Yes Historical Provider, MD  nadolol (CORGARD) 40 MG tablet TAKE 1/2 TABLET BY MOUTH EVERY DAY 04/25/15  Yes [name], MD  nortriptyline (PAMELOR) 50 MG capsule Take 100 mg by mouth at bedtime.   Yes Historical Provider, MD  Umeclidinium-Vilanterol (ANORO ELLIPTA) 62.5-25 MCG/INH AEPB Inhale 1 puff into the lungs daily.   Yes Historical Provider, MD  benzonatate (TESSALON) 100 MG capsule TAKE 1 TO 2 CAPSULES BY MOUTH 3 TIMES A DAY AS NEEDED FOR COUGH Patient not taking: Reported on 08/16/2015 06/27/15   Wardell Honour, MD  doxycycline (VIBRAMYCIN) 100 MG capsule Take 1 capsule (100 mg total) by mouth 2 (two) times daily. Patient not taking: Reported on 08/16/2015 07/05/15   Wardell Honour, MD  furosemide (LASIX) 20 MG tablet TAKE 2 TABLETS BY MOUTH EVERY DAY Patient not taking: Reported on 08/16/2015 05/25/15   [name], MD  HYDROcodone-acetaminophen (NORCO/VICODIN) 5-325 MG per tablet Take 1 tablet by mouth every 6 (six) hours as needed for moderate pain. Patient not taking: Reported on  08/16/2015 07/02/15   Shawnee Knapp, MD  potassium chloride SA (K-DUR,KLOR-CON) 20 MEQ tablet Take 1 tablet (20 mEq total) by mouth daily. Patient not taking: Reported on 08/16/2015 07/10/15   Wardell Honour, MD  tiZANidine (ZANAFLEX) 4 MG tablet Take 1 tablet (4 mg total) by mouth every 6 (six) hours as needed for muscle spasms. Patient not taking: Reported on 08/16/2015 07/05/15   Wardell Honour, MD   Allergies  Allergen Reactions  . Ceclor [Cefaclor] Hives  . Escitalopram Oxalate     Pt does not recall ever taking medication  . Sertraline Hcl     UNKNOWN  . Sulfa Drugs Cross Reactors Hives and Rash    Hives and rash    FAMILY HISTORY:  Family History  Problem Relation Age of Onset  . Stroke Mother   . Heart disease Father   . Hyperlipidemia Father   . Hypertension Father   . Breast cancer Maternal Aunt   . Asthma Maternal Grandmother  No history of Rheumatological Diseases.  SOCIAL HISTORY: Social History   Social History  . Marital Status: Married    Spouse Name: N/A  . Number of Children: N/A  . Years of Education: N/A   Occupational History  . SALES ASSOCIATE     [employer]   Social History Main Topics  . Smoking status: Current Some Day Smoker -- 1.00 packs/day for 42 years    Types: Cigarettes  . Smokeless tobacco: Never Used  . Alcohol Use: No  . Drug Use: No  . Sexual Activity: No   Other Topics Concern  . None   Social History Narrative   Patient currently lives with her husband. She works in Scientist, research (medical). They do have a dog. No bird or hot tub exposure. No mold exposure. No recent travel.   REVIEW OF SYSTEMS:  No rashes or bruising. No dysuria or hematuria. Patient does have some urinary incontinence with coughing spells. A pertinent 14 point review of systems is negative except as per the history of presenting illness.  SUBJECTIVE:   VITAL SIGNS: Temp:  [97.5 F (36.4 C)-98.2 F (36.8 C)] 97.5 F (36.4 C) (09/16 0800) Pulse Rate:  [66-79] 69 (09/16 0429) Resp:   [13-24] 21 (09/16 0429) BP: (72-109)/(24-72) 91/47 mmHg (09/16 0429) SpO2:  [84 %-100 %] 97 % (09/16 0429) Weight:  [141 lb (63.957 kg)-144 lb 13.5 oz (65.7 kg)] 144 lb 13.5 oz (65.7 kg) (09/15 1708)  PHYSICAL EXAMINATION: General:  Awake. Alert. No acute distress. Laying in bed with breakfast tray at bedside.  Integument:  Warm & dry. No rash on exposed skin. No bruising on exposed skin. Lymphatics:  No appreciated cervical or supraclavicular lymphadenoapthy. HEENT:  Moist mucus membranes. No oral ulcers. No scleral injection or icterus. PERRL. Cardiovascular:  Regular rate. No edema. No appreciable JVD.  Pulmonary:  Decreased breath sounds right lung base. Symmetric chest wall expansion. No accessory muscle use on nasal cannula oxygen. Abdomen: Soft. Normal bowel sounds. Nondistended. Grossly nontender. Musculoskeletal:  Normal bulk and tone. Hand grip strength 5/5 bilaterally. No joint deformity or effusion appreciated. Neurological:  CN 2-12 grossly in tact. No meningismus. Moving all 4 extremities equally. Symmetric patellar deep tendon reflexes. Psychiatric:  Mood and affect congruent. Speech normal rhythm, rate & tone.    Recent Labs Lab 08/16/15 1100 08/16/15 1954 08/17/15 0413  NA 119* 130* 131*  K 3.0* 2.6* 2.7*  CL 77* 92* 95*  CO2 32 30 31  BUN '14 8 7  ' CREATININE 0.73 0.40* 0.39*  GLUCOSE 121* 159* 129*    Recent Labs Lab 08/16/15 1100 08/17/15 0413  HGB 10.7* 9.9*  HCT 30.2* 29.7*  WBC 14.7* 8.6  PLT 232 203   Dg Chest 2 View  08/16/2015   CLINICAL DATA:  Dizziness  EXAM: CHEST  2 VIEW  COMPARISON:  10/2015  FINDINGS: Consolidation in the right middle and lower lobes is worse. Normal heart size. Left lung is clear. No pneumothorax. No pleural effusion.  IMPRESSION: Worsening right middle and lower lobe consolidation. Consider atypical organism or underlying malignancy.   Electronically Signed   By: Marybelle Killings M.D.   On: 08/16/2015 12:10   Ct Chest Wo  Contrast  08/17/2015   CLINICAL DATA:  Pneumonia with productive cough and fevers  EXAM: CT CHEST WITHOUT CONTRAST  TECHNIQUE: Multidetector CT imaging of the chest was performed following the standard protocol without IV contrast.  COMPARISON:  Chest radiograph August 16, 2015; chest CT February 07, 2015  FINDINGS: There  is extensive airspace consolidation throughout most of the right middle and lower lobes. There is ground-glass type opacity throughout much of the anterior segment of the right upper lobe as well as portions of the apical segment right upper lobe, also felt to be consistent with pneumonia. On the left, there is mild posterior basilar atelectasis. No consolidation is seen on the left.  There is atherosclerotic change in the aorta but no aneurysm. There is mild atherosclerotic calcification in the visualized great vessels. Visualized thyroid appears unremarkable. There are a few mildly prominent lymph nodes. There is a lymph node just anterior to the carina measuring 1.7 x 1.6 cm. There is a sub- carinal lymph node measuring 1.9 x 1.8 cm.  There are scattered foci of coronary artery calcification. The pericardium is not thickened.  Visualized upper abdominal structures appear unremarkable except for atherosclerotic calcification in the upper abdominal aorta.  There are no blastic or lytic bone lesions.  IMPRESSION: Widespread airspace consolidation throughout the right middle and lower lobes. Ground-glass type opacity, felt to represent pneumonia, in portions of the anterior apical segments of the right upper lobe.  Mild adenopathy, likely of reactive etiology given the degree of infiltrate present.  Scattered foci of coronary artery calcification.   Electronically Signed   By: Lowella Grip III M.D.   On: 08/17/2015 07:11   MICROBIOLOGY 08/16/15 Urine Legionella Ag:  Pending Urine Streptococcal Ag:  Negative Blood Ctx:  Pending Respiratory Ctx:  GNR  LABS 07/12/15 ESR: 44 ANA: 1:80  (POSITIVE) RF:  <10  08/03/14 ANA: 1:80 (POSITIVE) Anti-CCP:  <2.0 MPO:  <1.0 PR3:  <1.0 RF:  <10 SCL70:  <1.0   ASSESSMENT / PLAN: 64 year old female with underlying COPD, bronchiectasis, & ongoing tobacco use. Patient presents with what appears to be a right middle and lower lobe pneumonia. I question whether or not this may be due to some element of aspiration although the patient herself denies any history of a choking episode preceding her symptoms. In reviewing her chest CT scan she does have a dilated patchy esophagus up to the level of the cervical spine. She has previously had a negative SCL 70 and has no symptoms that would suggest underlying scleroderma. Overall the patient's blood pressure has improved after volume resuscitation and discontinuing her home diuretics. The patient mentioned she has been unable to afford inhaler medications. She also reports previously she had increased coughing on Anoro which she was using in place of Spiriva.  1. Pneumonia: Sputum culture pending. Agree with continuing vancomycin, Primaxin, & azithromycin for now while awaiting culture results. As an outpatient she probably should have a quantitative immunoglobulin panel performed to screen for immunodeficiency. 2. Acute hypoxic respiratory failure: Continue to wean oxygen for saturation greater than 90%. I am also ordering an incentive spirometer. 3. COPD with emphysema: No signs of exacerbation. Discontinuing stress dosed steroids at this time as she reports she does not take prednisone at home. Starting Duonebs every 4 hours. Continue to use albuterol via nebulizer as needed for coughing or wheezing. 4. GERD with dilated esophagus: Checking barium swallow. Starting absent 20 mg by mouth daily at bedtime. Resending SCL 70 along with anticentromere antibodies. 5. Ongoing tobacco use: I congratulated the patient on having not smoked for 3 weeks and encouraged her to continue to abstain from cigarette use  given her underlying lung disease.  Sonia Baller Ashok Cordia, M.D. [facility] Pager:  [phone] After 3pm or if no response, call [phone] 08/17/2015, 9:52 AM

## 2015-08-17 NOTE — Progress Notes (Signed)
CRITICAL VALUE ALERT  Critical value received:  Potassium 2.7  Date of notification:  08/17/2015  Time of notification:  0450  Critical value read back:Yes.    Nurse who received alert:  Dorrene German, RN  MD notified (1st page):  L. Harduk  Time of first page:  0450  MD notified (2nd page):  Time of second page:  Responding MD:  Roger Shelter  Time MD responded:  [phone]

## 2015-08-17 NOTE — Progress Notes (Signed)
Paged MD on call regarding discontinue of cardiac monitoring order, asked ICU RN to discontinue order prior to patient's arrival to the floor several times, will continue to contact MD Neta Mends RN 8:52 PM  08-17-2015

## 2015-08-18 DIAGNOSIS — I119 Hypertensive heart disease without heart failure: Secondary | ICD-10-CM

## 2015-08-18 LAB — BASIC METABOLIC PANEL
ANION GAP: 4 — AB (ref 5–15)
BUN: 7 mg/dL (ref 6–20)
CO2: 31 mmol/L (ref 22–32)
Calcium: 8.1 mg/dL — ABNORMAL LOW (ref 8.9–10.3)
Chloride: 95 mmol/L — ABNORMAL LOW (ref 101–111)
Creatinine, Ser: 0.41 mg/dL — ABNORMAL LOW (ref 0.44–1.00)
GFR calc Af Amer: >60 mL/min (ref >60)
Glucose, Bld: 114 mg/dL — ABNORMAL HIGH (ref 65–99)
POTASSIUM: 3.9 mmol/L (ref 3.5–5.1)
SODIUM: 130 mmol/L — AB (ref 135–145)

## 2015-08-18 LAB — GLUCOSE, CAPILLARY
GLUCOSE-CAPILLARY: 95 mg/dL (ref 65–99)
GLUCOSE-CAPILLARY: 81 mg/dL (ref 65–99)
Glucose-Capillary: 86 mg/dL (ref 65–99)

## 2015-08-18 LAB — VANCOMYCIN, TROUGH: VANCOMYCIN TR: 8 ug/mL — AB (ref 10.0–20.0)

## 2015-08-18 LAB — ANTI-SCLERODERMA ANTIBODY: Scleroderma (Scl-70) (ENA) Antibody, IgG: <0.2 AI (ref 0.0–0.9)

## 2015-08-18 MED ORDER — IPRATROPIUM-ALBUTEROL 0.5-2.5 (3) MG/3ML IN SOLN
3.0000 mL | Freq: Four times a day (QID) | RESPIRATORY_TRACT | Status: DC
  Administered 2015-08-19 – 2015-08-21 (×10): 3 mL via RESPIRATORY_TRACT
  Filled 2015-08-18 (×10): qty 3

## 2015-08-18 MED ORDER — DM-GUAIFENESIN ER 30-600 MG PO TB12
1.0000 | ORAL_TABLET | Freq: Two times a day (BID) | ORAL | Status: DC
  Administered 2015-08-18 – 2015-08-19 (×3): 1 via ORAL
  Filled 2015-08-18 (×4): qty 1

## 2015-08-18 MED ORDER — POLYETHYLENE GLYCOL 3350 17 G PO PACK
17.0000 g | PACK | Freq: Every day | ORAL | Status: DC
Start: 2015-08-18 — End: 2015-08-21
  Administered 2015-08-18: 17 g via ORAL
  Filled 2015-08-18 (×3): qty 1

## 2015-08-18 MED ORDER — VANCOMYCIN HCL IN DEXTROSE 1-5 GM/200ML-% IV SOLN
1000.0000 mg | Freq: Two times a day (BID) | INTRAVENOUS | Status: DC
  Administered 2015-08-18 – 2015-08-20 (×4): 1000 mg via INTRAVENOUS
  Filled 2015-08-18 (×4): qty 200

## 2015-08-18 NOTE — Progress Notes (Signed)
Brief Pharmacy Consult Note - Vancomycin Follow Up  Labs: vanc trough 8  A/P: Vanc trough subtherapeutic (goal 15-20) on dose '750mg'$  IV q12. Will increase dose to 1g q12 and recheck trough prn  Adrian Saran, PharmD, BCPS Pager [phone] 08/18/2015 5:00 PM

## 2015-08-18 NOTE — Progress Notes (Signed)
[facility] PROGRESS NOTE  Assessment/Plan: Severe sepsis without septic shock/Acute respiratory failure with hypoxia due to CAP (community acquired pneumonia): - Started empirically on vancomycin and imipenem on 08/16/2015 blood pressure has remained stable, leukocytosis is improved. - The cultures remain negative, afebirle leukocytosis resolved. - Lungs are clear, feels better continue IV antibiotics.  Rhabdomyolysis - Resolved with IV hydration.  Hyponatremia - Likely due to hypovolemia improving with IV hydration. - b-met pending  Benign hypertensive heart disease without heart failure Cont to hold antihypertensive medications.    Code Status: full Family Communication: none  Disposition Plan: inpatient home in 5-6 days, transfer to regular floor.   Consultants:  Pulmonary  Procedures:  CT chest  Antibiotics: Vancomycin and imipenem 9.16.2016  Subjective: She feels uncomfortable as she hasn't had a bowel movement in 4 days.  Objective: Filed Vitals:   08/17/15 2200 08/17/15 2316 08/18/15 0559 08/18/15 0745  BP: 117/57  101/44   Pulse: 77  89   Temp: 97.7 F (36.5 C)  98.8 F (37.1 C)   TempSrc: Oral  Oral   Resp: 14  14   Height:      Weight:      SpO2: 96% 97%  93%    Intake/Output Summary (Last 24 hours) at 08/18/15 1019 Last data filed at 08/18/15 0500  Gross per 24 hour  Intake    652 ml  Output    200 ml  Net    452 ml   Filed Weights   08/16/15 1336 08/16/15 1708  Weight: 63.957 kg (141 lb) 65.7 kg (144 lb 13.5 oz)    Exam:  General: Alert, awake, oriented x3, in no acute distress.  HEENT: No bruits, no goiter.  Heart: Regular rate and rhythm. Lungs: Good air movement, clear no rails. Abdomen: Soft, nontender, nondistended, positive bowel sounds.  Neuro: Grossly intact, nonfocal.   Data Reviewed: Basic Metabolic Panel:  Recent Labs Lab 08/16/15 1100 08/16/15 1954 08/16/15 2230 08/17/15 0413  NA 119* 130*  --   131*  K 3.0* 2.6*  --  2.7*  CL 77* 92*  --  95*  CO2 32 30  --  31  GLUCOSE 121* 159*  --  129*  BUN 14 8  --  7  CREATININE 0.73 0.40*  --  0.39*  CALCIUM 7.4* 7.4*  --  7.7*  MG  --   --  1.6*  --    Liver Function Tests: No results for input(s): AST, ALT, ALKPHOS, BILITOT, PROT, ALBUMIN in the last 168 hours. No results for input(s): LIPASE, AMYLASE in the last 168 hours. No results for input(s): AMMONIA in the last 168 hours. CBC:  Recent Labs Lab 08/16/15 1100 08/17/15 0413  WBC 14.7* 8.6  HGB 10.7* 9.9*  HCT 30.2* 29.7*  MCV 84.6 87.1  PLT 232 203   Cardiac Enzymes:  Recent Labs Lab 08/16/15 1256  CKTOTAL 650*   BNP (last 3 results) No results for input(s): BNP in the last 8760 hours.  ProBNP (last 3 results) No results for input(s): PROBNP in the last 8760 hours.  CBG:  Recent Labs Lab 08/16/15 2132 08/17/15 1235 08/17/15 1554 08/17/15 2146 08/18/15 0657  GLUCAP 146* 136* 145* 100* 86    Recent Results (from the past 240 hour(s))  Blood Culture (routine x 2)     Status: None (Preliminary result)   Collection Time: 08/16/15 12:56 PM  Result Value Ref Range Status   Specimen Description BLOOD RIGHT WRIST  Final  Special Requests BOTTLES DRAWN AEROBIC AND ANAEROBIC 5CC  Final   Culture   Final    NO GROWTH 1 DAY Performed at [facility]    Report Status PENDING  Incomplete  Blood Culture (routine x 2)     Status: None (Preliminary result)   Collection Time: 08/16/15  1:10 PM  Result Value Ref Range Status   Specimen Description BLOOD LEFT FOREARM  Final   Special Requests BOTTLES DRAWN AEROBIC AND ANAEROBIC 5CC  Final   Culture   Final    NO GROWTH 1 DAY Performed at [facility]    Report Status PENDING  Incomplete  Urine culture     Status: None (Preliminary result)   Collection Time: 08/16/15  1:10 PM  Result Value Ref Range Status   Specimen Description URINE, CLEAN CATCH  Final   Special Requests NONE  Final    Culture   Final    >=100,000 COLONIES/mL KLEBSIELLA PNEUMONIAE REPEATING SENSITIVITIES Performed at [facility]    Report Status PENDING  Incomplete  MRSA PCR Screening     Status: None   Collection Time: 08/16/15  5:06 PM  Result Value Ref Range Status   MRSA by PCR NEGATIVE NEGATIVE Final    Comment:        The GeneXpert MRSA Assay (FDA approved for NASAL specimens only), is one component of a comprehensive MRSA colonization surveillance program. It is not intended to diagnose MRSA infection nor to guide or monitor treatment for MRSA infections.   Culture, sputum-assessment     Status: None   Collection Time: 08/16/15  7:17 PM  Result Value Ref Range Status   Specimen Description SPUTUM  Final   Special Requests NONE  Final   Sputum evaluation   Final    THIS SPECIMEN IS ACCEPTABLE. RESPIRATORY CULTURE REPORT TO FOLLOW.   Report Status 08/16/2015 FINAL  Final  Culture, respiratory (NON-Expectorated)     Status: None (Preliminary result)   Collection Time: 08/16/15  8:52 PM  Result Value Ref Range Status   Specimen Description SPUTUM  Final   Special Requests NONE  Final   Gram Stain   Final    ABUNDANT WBC PRESENT,BOTH PMN AND MONONUCLEAR NO SQUAMOUS EPITHELIAL CELLS SEEN RARE GRAM NEGATIVE RODS Performed at [facility]    Culture PENDING  Incomplete   Report Status PENDING  Incomplete     Studies: Dg Chest 2 View  08/16/2015   CLINICAL DATA:  Dizziness  EXAM: CHEST  2 VIEW  COMPARISON:  10/2015  FINDINGS: Consolidation in the right middle and lower lobes is worse. Normal heart size. Left lung is clear. No pneumothorax. No pleural effusion.  IMPRESSION: Worsening right middle and lower lobe consolidation. Consider atypical organism or underlying malignancy.   Electronically Signed   By: Marybelle Killings M.D.   On: 08/16/2015 12:10   Ct Chest Wo Contrast  08/17/2015   CLINICAL DATA:  Pneumonia with productive cough and fevers  EXAM: CT CHEST WITHOUT  CONTRAST  TECHNIQUE: Multidetector CT imaging of the chest was performed following the standard protocol without IV contrast.  COMPARISON:  Chest radiograph August 16, 2015; chest CT February 07, 2015  FINDINGS: There is extensive airspace consolidation throughout most of the right middle and lower lobes. There is ground-glass type opacity throughout much of the anterior segment of the right upper lobe as well as portions of the apical segment right upper lobe, also felt to be consistent with pneumonia. On the  left, there is mild posterior basilar atelectasis. No consolidation is seen on the left.  There is atherosclerotic change in the aorta but no aneurysm. There is mild atherosclerotic calcification in the visualized great vessels. Visualized thyroid appears unremarkable. There are a few mildly prominent lymph nodes. There is a lymph node just anterior to the carina measuring 1.7 x 1.6 cm. There is a sub- carinal lymph node measuring 1.9 x 1.8 cm.  There are scattered foci of coronary artery calcification. The pericardium is not thickened.  Visualized upper abdominal structures appear unremarkable except for atherosclerotic calcification in the upper abdominal aorta.  There are no blastic or lytic bone lesions.  IMPRESSION: Widespread airspace consolidation throughout the right middle and lower lobes. Ground-glass type opacity, felt to represent pneumonia, in portions of the anterior apical segments of the right upper lobe.  Mild adenopathy, likely of reactive etiology given the degree of infiltrate present.  Scattered foci of coronary artery calcification.   Electronically Signed   By: Lowella Grip III M.D.   On: 08/17/2015 07:11   Dg Esophagus  08/17/2015   CLINICAL DATA:  Pneumonia with dysphagia. Evaluate for reflux. Initial encounter.  EXAM: ESOPHOGRAM/BARIUM SWALLOW  TECHNIQUE: Single contrast examination was performed using  thin barium.  FLUOROSCOPY TIME:  Radiation Exposure Index (as provided by  the fluoroscopic device):  If the device does not provide the exposure index:  Fluoroscopy Time:  44 seconds.  Number of Acquired Images: 1 rapid sequence series consisting of 9 images. Additional images acquired with fluoro store.  COMPARISON:  Chest CT 08/16/2015.  FINDINGS: The esophageal motility is within normal limits. There is no evidence of stricture, mass or ulceration. Rapid sequence imaging of the pharynx in the lateral projection demonstrates no mucosal abnormalities.  No gastroesophageal reflux was elicited with the water siphon test. At the conclusion of the study, a 13 mm barium tablet was administered. This passed without delay into the stomach.  IMPRESSION: No significant findings.  No evidence of aspiration or reflux.   Electronically Signed   By: Richardean Sale M.D.   On: 08/17/2015 12:48    Scheduled Meds: . ALPRAZolam  0.5 mg Oral QHS  . divalproex  500 mg Oral QHS  . enoxaparin (LOVENOX) injection  40 mg Subcutaneous Q24H  . famotidine  20 mg Oral QHS  . imipenem-cilastatin  500 mg Intravenous Q8H  . insulin aspart  0-9 Units Subcutaneous TID WC  . ipratropium  2 spray Each Nare BID  . ipratropium-albuterol  3 mL Nebulization Q4H  . nortriptyline  100 mg Oral QHS  . saccharomyces boulardii  250 mg Oral BID  . senna  1 tablet Oral Daily  . sodium chloride  3 mL Intravenous Q12H  . vancomycin  750 mg Intravenous Q12H   Continuous Infusions:    Time Spent: 25 min   Charlynne Cousins  [facility] Pager [phone]. If 7PM-7AM, please contact night-coverage at [email or web address], password [facility] 08/18/2015, 10:19 AM  LOS: 2 days

## 2015-08-18 NOTE — Progress Notes (Signed)
ANTIBIOTIC CONSULT NOTE - FOLLOW UP  Pharmacy Consult for Vancomycin, antibiotic renal dose adjustment - Primaxin Indication: HCAP  Allergies  Allergen Reactions  . Ceclor [Cefaclor] Hives  . Escitalopram Oxalate     Pt does not recall ever taking medication  . Sertraline Hcl     UNKNOWN  . Sulfa Drugs Cross Reactors Hives and Rash    Hives and rash    Patient Measurements: Height: '5\' 3"'$  (160 cm) Weight: 144 lb 13.5 oz (65.7 kg) IBW/kg (Calculated) : 52.4  Vital Signs: Temp: 98.8 F (37.1 C) (09/17 0559) Temp Source: Oral (09/17 0559) BP: 101/44 mmHg (09/17 0559) Pulse Rate: 89 (09/17 0559)  Labs:  Recent Labs  08/16/15 1100 08/16/15 1738 08/16/15 1954 08/17/15 0413  WBC 14.7*  --   --  8.6  HGB 10.7*  --   --  9.9*  PLT 232  --   --  203  LABCREA  --  <10.0  --   --   CREATININE 0.73  --  0.40* 0.39*   Estimated Creatinine Clearance: 63.9 mL/min (by C-G formula based on Cr of 0.39). No results for input(s): VANCOTROUGH, VANCOPEAK, VANCORANDOM, GENTTROUGH, GENTPEAK, GENTRANDOM, TOBRATROUGH, TOBRAPEAK, TOBRARND, AMIKACINPEAK, AMIKACINTROU, AMIKACIN in the last 72 hours.    Assessment: 16 y/oF with PMH of HTN, anxiety, depression, COPD, asthma who presents with complaints of generalized weakness and productive cough of green and yellow sputum for several weeks. CXR shows worsening of right middle and lower lobe consolidation. Pharmacy was initially consulted to dose Ceftriaxone and Azithromycin for CAP, but broadened to vancomycin and Primaxin for possible HCAP.  9/15 >> Ceftriaxone >> 9/15 9/15 >> Azithromycin >> 9/15 9/15 >> vancomycin >> 9/15 >> Primaxin >>   Today, 08/18/2015: Day #3 antibiotics  Afebrile  WBC 8.6 (last on 9/16, solucortef given 9/15-9/16)  SCr 0.39 (last on 9/16) is low, CrCl ~ 79 ml/min (Normalized using SCr 0.8)  Vancomycin trough level pending collection.  Goal of Therapy:  Vancomycin trough level 15-20 mcg/ml Appropriate abx  dosing, eradication of infection.   Plan:   Continue Primaxin 500 mg IV q8h  Continue Vancomycin 750 mg IV q12h  Measure Vanc trough at steady state - prior to 1700 dose on 9/17  Follow up renal fxn, culture results, and clinical course.  Gretta Arab PharmD, BCPS Pager [phone] 08/18/2015 9:43 AM

## 2015-08-19 LAB — BASIC METABOLIC PANEL
ANION GAP: 7 (ref 5–15)
BUN: 7 mg/dL (ref 6–20)
CHLORIDE: 95 mmol/L — AB (ref 101–111)
CO2: 29 mmol/L (ref 22–32)
CREATININE: 0.46 mg/dL (ref 0.44–1.00)
Calcium: 8.5 mg/dL — ABNORMAL LOW (ref 8.9–10.3)
GFR calc non Af Amer: >60 mL/min (ref >60)
Glucose, Bld: 94 mg/dL (ref 65–99)
POTASSIUM: 3.8 mmol/L (ref 3.5–5.1)
SODIUM: 131 mmol/L — AB (ref 135–145)

## 2015-08-19 LAB — URINE CULTURE: Culture: >=100000

## 2015-08-19 LAB — GLUCOSE, CAPILLARY
GLUCOSE-CAPILLARY: 94 mg/dL (ref 65–99)
GLUCOSE-CAPILLARY: 85 mg/dL (ref 65–99)
GLUCOSE-CAPILLARY: 87 mg/dL (ref 65–99)
GLUCOSE-CAPILLARY: 86 mg/dL (ref 65–99)
Glucose-Capillary: 107 mg/dL — ABNORMAL HIGH (ref 65–99)

## 2015-08-19 LAB — CULTURE, RESPIRATORY W GRAM STAIN

## 2015-08-19 LAB — CULTURE, RESPIRATORY: CULTURE: NORMAL

## 2015-08-19 MED ORDER — DM-GUAIFENESIN ER 30-600 MG PO TB12
2.0000 | ORAL_TABLET | Freq: Two times a day (BID) | ORAL | Status: DC
  Administered 2015-08-19 – 2015-08-21 (×4): 2 via ORAL
  Filled 2015-08-19 (×5): qty 2

## 2015-08-19 MED ORDER — SODIUM CHLORIDE 0.9 % IV BOLUS (SEPSIS)
1000.0000 mL | Freq: Once | INTRAVENOUS | Status: AC
  Administered 2015-08-19: 1000 mL via INTRAVENOUS

## 2015-08-19 MED ORDER — PANTOPRAZOLE SODIUM 40 MG PO TBEC
40.0000 mg | DELAYED_RELEASE_TABLET | Freq: Two times a day (BID) | ORAL | Status: DC
  Administered 2015-08-19 – 2015-08-21 (×4): 40 mg via ORAL
  Filled 2015-08-19 (×7): qty 1

## 2015-08-19 NOTE — Progress Notes (Signed)
Name: Catherine Kelley MRN: [id number] DOB: [birth year]    ADMISSION DATE:  08/16/2015 CONSULTATION DATE:  08/17/2015  REFERRING MD :  Dr. Olevia Bowens  CHIEF COMPLAINT:  Pneumonia/Bronchiectasis  BRIEF PATIENT DESCRIPTION: 64 year old female with known underlying COPD & emphysema. Patient also has known history of tobacco use. She was admitted to hospital in July 13 2014 with necrotizing right lower lobe pneumonia and had prolonged course of antibiotics. Serum ANA was positive but rheumatoid factor negative. Has been unable to afford inhaler medications other than her nebulizer.  SIGNIFICANT EVENTS  August 2015 - Necrotizing RLL Pneumonia 08/16/15  - Admitted with RML & RLL Pneumonia  STUDIES:  CT Chest W/O 9/15: 2 opacification & consolidation most densely in the right lower lobe and also present within the right middle lobe. Some groundglass opacity within the right upper lobe as well. No appreciated pleural effusion. Patchy dilated esophagus the level of the cervical spine.       SUBJECTIVE:  Congested rattling cough/ has flutter at bedside not suing   VITAL SIGNS: Temp:  [98.2 F (36.8 C)-99.5 F (37.5 C)] 98.2 F (36.8 C) (09/18 0451) Pulse Rate:  [94-103] 101 (09/18 0451) Resp:  [16-20] 20 (09/18 0451) BP: (129-145)/(47-65) 144/65 mmHg (09/18 0451) SpO2:  [90 %-96 %] 92 % (09/18 0840) FiO2 (%):  [2 %] 2 % (09/17 1150)  FIO2  RA   PHYSICAL EXAMINATION: General:  Awake. Alert. No acute distress. 45 degrees HOB in bed cogested cough  Integument:  Warm & dry. No rash on exposed skin. No bruising on exposed skin. Lymphatics:  No appreciated cervical or supraclavicular lymphadenoapthy. HEENT:  Moist mucus membranes. No oral ulcers.   Cardiovascular:  Regular rate. No edema. No appreciable JVD.  Pulmonary:  Decreased breath sounds right lung base. Symmetric chest wall expansion. No accessory muscle use minimal junky bilateral insp and exp rhonchi. Abdomen: Soft. Normal bowel sounds.  Nondistended. Grossly nontender.     Recent Labs Lab 08/17/15 0413 08/18/15 1554 08/19/15 0534  NA 131* 130* 131*  K 2.7* 3.9 3.8  CL 95* 95* 95*  CO2 '31 31 29  ' BUN '7 7 7  ' CREATININE 0.39* 0.41* 0.46  GLUCOSE 129* 114* 94    Recent Labs Lab 08/16/15 1100 08/17/15 0413  HGB 10.7* 9.9*  HCT 30.2* 29.7*  WBC 14.7* 8.6  PLT 232 203   Dg Esophagus  08/17/2015   CLINICAL DATA:  Pneumonia with dysphagia. Evaluate for reflux. Initial encounter.  EXAM: ESOPHOGRAM/BARIUM SWALLOW  TECHNIQUE: Single contrast examination was performed using  thin barium.  FLUOROSCOPY TIME:  Radiation Exposure Index (as provided by the fluoroscopic device):  If the device does not provide the exposure index:  Fluoroscopy Time:  44 seconds.  Number of Acquired Images: 1 rapid sequence series consisting of 9 images. Additional images acquired with fluoro store.  COMPARISON:  Chest CT 08/16/2015.  FINDINGS: The esophageal motility is within normal limits. There is no evidence of stricture, mass or ulceration. Rapid sequence imaging of the pharynx in the lateral projection demonstrates no mucosal abnormalities.  No gastroesophageal reflux was elicited with the water siphon test. At the conclusion of the study, a 13 mm barium tablet was administered. This passed without delay into the stomach.  IMPRESSION: No significant findings.  No evidence of aspiration or reflux.   Electronically Signed   By: Richardean Sale M.D.   On: 08/17/2015 12:48   MICROBIOLOGY 08/16/15 -Urine Legionella Ag:  Pending -Urine Streptococcal Ag:  Negative -Blood Ctx:  Pending -Respiratory Ctx:  GNR  LABS 07/12/15 -ESR: 44 -ANA: 1:80 (POSITIVE) -RF:  <10  08/03/14 -ANA: 1:80 (POSITIVE) -Anti-CCP:  <2.0 -MPO:  <1.0 -PR3:  <1.0 -RF:  <10 -SCL70:  <1.0   ASSESSMENT / PLAN:  1. Pneumonia in pt with documented bronchiectasis esp RLL: Sputum culture pending. Agree with continuing vancomycin, Primaxin, & azithromycin for now while  awaiting culture results. As an outpatient she probably should have a quantitative immunoglobulin panel performed to screen for immunodeficiency. rec work harder on flutter valve rx  2. Acute hypoxic respiratory failure, resolved, pt now RA  3. COPD GOLD II (barely actually meets the criteria so only moderate at worst) - PFT's 06/06/2015  FEV1 1.51 (67 % ) ratio 69  p no % improvement from saba with DLCO  70 % corrects to 101 % for alv volume   rec  Duonebs every 4 hours. Continue to use albuterol via nebulizer as needed for coughing or wheezing.  4. GERD with dilated esophagus: Checking barium swallow  9/16 No significant findings. No evidence of aspiration or reflux.No significant findings. No evidence of aspiration or reflux - since actively coughing rec ppi bid ac until stops since bad cough can cause secondary gerd which causes more cough is a cyclical pattern   5. Cigarette smoking:   ? Resolved 3 weeks PTA > reinforced impt of abstaining going forward esp given know effects on MC fxn     Christinia Gully, MD Pulmonary and [place] [phone] After 5:30 PM or weekends, call [phone]

## 2015-08-19 NOTE — Progress Notes (Signed)
[facility] PROGRESS NOTE  Severe sepsis without septic shock/Acute respiratory failure with hypoxia due to CAP (community acquired pneumonia): - The cultures have remain negative, afebirle leukocytosis resolved. - Lungs are clear, feels better continue IV antibiotics. - she relates she cont to have a significant cough and feels weak.  Rhabdomyolysis - Resolved with IV hydration.  Hyponatremia - slight improve, give NS bolus recheck in am.  Benign hypertensive heart disease without heart failure Cont to hold antihypertensive medications  Consitpation: Started on miralax   Code Status: full Family Communication: none  Disposition Plan: inpatient home in 1-2 cdays   Consultants:  pulmonmary  Procedures:  CT chest  Antibiotics:  Vanc and imipenem 9.16.2016>>>  HPI/Subjective: Has not had a BM.  Objective: Filed Vitals:   08/18/15 1939 08/18/15 2132 08/19/15 0451 08/19/15 0840  BP:  145/64 144/65   Pulse:  103 101   Temp:  99.5 F (37.5 C) 98.2 F (36.8 C)   TempSrc:  Axillary Oral   Resp:  16 20   Height:      Weight:      SpO2: 94% 91% 90% 92%    Intake/Output Summary (Last 24 hours) at 08/19/15 0922 Last data filed at 08/19/15 0600  Gross per 24 hour  Intake   1200 ml  Output    500 ml  Net    700 ml   Filed Weights   08/16/15 1336 08/16/15 1708  Weight: 63.957 kg (141 lb) 65.7 kg (144 lb 13.5 oz)    Exam:  General: Alert, awake, oriented x3, in no acute distress.  HEENT: No bruits, no goiter.  Heart: Regular rate and rhythm. Lungs: Good air movement, clear Abdomen: Soft, nontender, nondistended, positive bowel sounds.  Neuro: Grossly intact, nonfocal.   Data Reviewed: Basic Metabolic Panel:  Recent Labs Lab 08/16/15 1100 08/16/15 1954 08/16/15 2230 08/17/15 0413 08/18/15 1554 08/19/15 0534  NA 119* 130*  --  131* 130* 131*  K 3.0* 2.6*  --  2.7* 3.9 3.8  CL 77* 92*  --  95* 95* 95*  CO2 32 30  --  '31 31 29    '$ GLUCOSE 121* 159*  --  129* 114* 94  BUN 14 8  --  '7 7 7  '$ CREATININE 0.73 0.40*  --  0.39* 0.41* 0.46  CALCIUM 7.4* 7.4*  --  7.7* 8.1* 8.5*  MG  --   --  1.6*  --   --   --    Liver Function Tests: No results for input(s): AST, ALT, ALKPHOS, BILITOT, PROT, ALBUMIN in the last 168 hours. No results for input(s): LIPASE, AMYLASE in the last 168 hours. No results for input(s): AMMONIA in the last 168 hours. CBC:  Recent Labs Lab 08/16/15 1100 08/17/15 0413  WBC 14.7* 8.6  HGB 10.7* 9.9*  HCT 30.2* 29.7*  MCV 84.6 87.1  PLT 232 203   Cardiac Enzymes:  Recent Labs Lab 08/16/15 1256  CKTOTAL 650*   BNP (last 3 results) No results for input(s): BNP in the last 8760 hours.  ProBNP (last 3 results) No results for input(s): PROBNP in the last 8760 hours.  CBG:  Recent Labs Lab 08/18/15 0657 08/18/15 1253 08/18/15 1730 08/18/15 2315 08/19/15 0753  GLUCAP 86 95 81 94 85    Recent Results (from the past 240 hour(s))  Blood Culture (routine x 2)     Status: None (Preliminary result)   Collection Time: 08/16/15 12:56 PM  Result Value Ref Range Status  Specimen Description BLOOD RIGHT WRIST  Final   Special Requests BOTTLES DRAWN AEROBIC AND ANAEROBIC 5CC  Final   Culture   Final    NO GROWTH 2 DAYS Performed at [facility]    Report Status PENDING  Incomplete  Blood Culture (routine x 2)     Status: None (Preliminary result)   Collection Time: 08/16/15  1:10 PM  Result Value Ref Range Status   Specimen Description BLOOD LEFT FOREARM  Final   Special Requests BOTTLES DRAWN AEROBIC AND ANAEROBIC 5CC  Final   Culture   Final    NO GROWTH 2 DAYS Performed at [facility]    Report Status PENDING  Incomplete  Urine culture     Status: None (Preliminary result)   Collection Time: 08/16/15  1:10 PM  Result Value Ref Range Status   Specimen Description URINE, CLEAN CATCH  Final   Special Requests NONE  Final   Culture   Final    >=100,000  COLONIES/mL KLEBSIELLA PNEUMONIAE REPEATING SENSITIVITIES Performed at [facility]    Report Status PENDING  Incomplete  MRSA PCR Screening     Status: None   Collection Time: 08/16/15  5:06 PM  Result Value Ref Range Status   MRSA by PCR NEGATIVE NEGATIVE Final    Comment:        The GeneXpert MRSA Assay (FDA approved for NASAL specimens only), is one component of a comprehensive MRSA colonization surveillance program. It is not intended to diagnose MRSA infection nor to guide or monitor treatment for MRSA infections.   Culture, sputum-assessment     Status: None   Collection Time: 08/16/15  7:17 PM  Result Value Ref Range Status   Specimen Description SPUTUM  Final   Special Requests NONE  Final   Sputum evaluation   Final    THIS SPECIMEN IS ACCEPTABLE. RESPIRATORY CULTURE REPORT TO FOLLOW.   Report Status 08/16/2015 FINAL  Final  Culture, respiratory (NON-Expectorated)     Status: None (Preliminary result)   Collection Time: 08/16/15  8:52 PM  Result Value Ref Range Status   Specimen Description SPUTUM  Final   Special Requests NONE  Final   Gram Stain   Final    ABUNDANT WBC PRESENT,BOTH PMN AND MONONUCLEAR NO SQUAMOUS EPITHELIAL CELLS SEEN RARE GRAM NEGATIVE RODS Performed at [facility]    Culture   Final    NORMAL OROPHARYNGEAL FLORA Performed at [facility]    Report Status PENDING  Incomplete     Studies: Dg Esophagus  08/17/2015   CLINICAL DATA:  Pneumonia with dysphagia. Evaluate for reflux. Initial encounter.  EXAM: ESOPHOGRAM/BARIUM SWALLOW  TECHNIQUE: Single contrast examination was performed using  thin barium.  FLUOROSCOPY TIME:  Radiation Exposure Index (as provided by the fluoroscopic device):  If the device does not provide the exposure index:  Fluoroscopy Time:  44 seconds.  Number of Acquired Images: 1 rapid sequence series consisting of 9 images. Additional images acquired with fluoro store.  COMPARISON:  Chest CT  08/16/2015.  FINDINGS: The esophageal motility is within normal limits. There is no evidence of stricture, mass or ulceration. Rapid sequence imaging of the pharynx in the lateral projection demonstrates no mucosal abnormalities.  No gastroesophageal reflux was elicited with the water siphon test. At the conclusion of the study, a 13 mm barium tablet was administered. This passed without delay into the stomach.  IMPRESSION: No significant findings.  No evidence of aspiration or reflux.  Electronically Signed   By: Richardean Sale M.D.   On: 08/17/2015 12:48    Scheduled Meds: . ALPRAZolam  0.5 mg Oral QHS  . dextromethorphan-guaiFENesin  1 tablet Oral BID  . divalproex  500 mg Oral QHS  . enoxaparin (LOVENOX) injection  40 mg Subcutaneous Q24H  . famotidine  20 mg Oral QHS  . imipenem-cilastatin  500 mg Intravenous Q8H  . insulin aspart  0-9 Units Subcutaneous TID WC  . ipratropium  2 spray Each Nare BID  . ipratropium-albuterol  3 mL Nebulization Q6H  . nortriptyline  100 mg Oral QHS  . polyethylene glycol  17 g Oral Daily  . saccharomyces boulardii  250 mg Oral BID  . senna  1 tablet Oral Daily  . sodium chloride  1,000 mL Intravenous Once  . sodium chloride  3 mL Intravenous Q12H  . vancomycin  1,000 mg Intravenous Q12H   Continuous Infusions:   Time Spent: 15 min   FELIZ Marguarite Arbour  [facility] Pager [phone]. If 7PM-7AM, please contact night-coverage at [email or web address], password [facility] 08/19/2015, 9:22 AM  LOS: 3 days

## 2015-08-20 DIAGNOSIS — J479 Bronchiectasis, uncomplicated: Secondary | ICD-10-CM

## 2015-08-20 LAB — BASIC METABOLIC PANEL
ANION GAP: 9 (ref 5–15)
BUN: 6 mg/dL (ref 6–20)
CALCIUM: 8.2 mg/dL — AB (ref 8.9–10.3)
CO2: 28 mmol/L (ref 22–32)
Chloride: 94 mmol/L — ABNORMAL LOW (ref 101–111)
Creatinine, Ser: <0.3 mg/dL — ABNORMAL LOW (ref 0.44–1.00)
GLUCOSE: 98 mg/dL (ref 65–99)
POTASSIUM: 3.1 mmol/L — AB (ref 3.5–5.1)
Sodium: 131 mmol/L — ABNORMAL LOW (ref 135–145)

## 2015-08-20 LAB — LEGIONELLA ANTIGEN, URINE

## 2015-08-20 LAB — GLUCOSE, CAPILLARY
GLUCOSE-CAPILLARY: 99 mg/dL (ref 65–99)
GLUCOSE-CAPILLARY: 92 mg/dL (ref 65–99)
GLUCOSE-CAPILLARY: 112 mg/dL — AB (ref 65–99)
Glucose-Capillary: 98 mg/dL (ref 65–99)

## 2015-08-20 LAB — CENTROMERE ANTIBODIES: CENTROMERE AB SCREEN: NEGATIVE

## 2015-08-20 MED ORDER — AMOXICILLIN-POT CLAVULANATE 875-125 MG PO TABS
1.0000 | ORAL_TABLET | Freq: Two times a day (BID) | ORAL | Status: DC
  Administered 2015-08-20 – 2015-08-21 (×3): 1 via ORAL
  Filled 2015-08-20 (×4): qty 1

## 2015-08-20 MED ORDER — POTASSIUM CHLORIDE CRYS ER 20 MEQ PO TBCR
40.0000 meq | EXTENDED_RELEASE_TABLET | Freq: Two times a day (BID) | ORAL | Status: AC
  Administered 2015-08-20 (×2): 40 meq via ORAL
  Filled 2015-08-20 (×2): qty 2

## 2015-08-20 NOTE — Progress Notes (Signed)
[facility] PROGRESS NOTE  Severe sepsis without septic shock/Acute respiratory failure with hypoxia due to CAP (community acquired pneumonia): - The cultures have remain negative, afebirle leukocytosis resolved. - Lungs are clear, feels better continue IV antibiotics fro today de-escalate to levaquin in am - check stas with ambulation.  Rhabdomyolysis - Resolved with IV hydration.  Hyponatremia - improved. Mild hypokalemia replete.  Benign hypertensive heart disease without heart failure Cont to hold antihypertensive medications  Consitpation: resolved  Code Status: full Family Communication: none  Disposition Plan: inpatient home in am   Consultants:  pulmonmary  Procedures:  CT chest  Antibiotics:  Vanc and imipenem 9.16.2016>>>  HPI/Subjective: Having BM, no complains  Objective: Filed Vitals:   08/19/15 0840 08/19/15 1501 08/19/15 2014 08/19/15 2020  BP:  121/60    Pulse:  111    Temp:  98.6 F (37 C)    TempSrc:  Oral    Resp:  20    Height:      Weight:      SpO2: 92% 91% 88% 94%    Intake/Output Summary (Last 24 hours) at 08/20/15 0843 Last data filed at 08/20/15 0200  Gross per 24 hour  Intake    780 ml  Output      0 ml  Net    780 ml   Filed Weights   08/16/15 1336 08/16/15 1708  Weight: 63.957 kg (141 lb) 65.7 kg (144 lb 13.5 oz)    Exam:  General: Alert, awake, oriented x3, in no acute distress.  HEENT: No bruits, no goiter.  Heart: Regular rate and rhythm. Lungs: Good air movement, clear Abdomen: Soft, nontender, nondistended, positive bowel sounds.     Data Reviewed: Basic Metabolic Panel:  Recent Labs Lab 08/16/15 1954 08/16/15 2230 08/17/15 0413 08/18/15 1554 08/19/15 0534 08/20/15 0605  NA 130*  --  131* 130* 131* 131*  K 2.6*  --  2.7* 3.9 3.8 3.1*  CL 92*  --  95* 95* 95* 94*  CO2 30  --  '31 31 29 28  '$ GLUCOSE 159*  --  129* 114* 94 98  BUN 8  --  '7 7 7 6  '$ CREATININE 0.40*  --  0.39* 0.41*  0.46 <0.30*  CALCIUM 7.4*  --  7.7* 8.1* 8.5* 8.2*  MG  --  1.6*  --   --   --   --    Liver Function Tests: No results for input(s): AST, ALT, ALKPHOS, BILITOT, PROT, ALBUMIN in the last 168 hours. No results for input(s): LIPASE, AMYLASE in the last 168 hours. No results for input(s): AMMONIA in the last 168 hours. CBC:  Recent Labs Lab 08/16/15 1100 08/17/15 0413  WBC 14.7* 8.6  HGB 10.7* 9.9*  HCT 30.2* 29.7*  MCV 84.6 87.1  PLT 232 203   Cardiac Enzymes:  Recent Labs Lab 08/16/15 1256  CKTOTAL 650*   BNP (last 3 results) No results for input(s): BNP in the last 8760 hours.  ProBNP (last 3 results) No results for input(s): PROBNP in the last 8760 hours.  CBG:  Recent Labs Lab 08/19/15 0753 08/19/15 1241 08/19/15 1728 08/19/15 2200 08/20/15 0803  GLUCAP 85 87 107* 86 98    Recent Results (from the past 240 hour(s))  Blood Culture (routine x 2)     Status: None (Preliminary result)   Collection Time: 08/16/15 12:56 PM  Result Value Ref Range Status   Specimen Description BLOOD RIGHT WRIST  Final   Special Requests BOTTLES  DRAWN AEROBIC AND ANAEROBIC 5CC  Final   Culture   Final    NO GROWTH 3 DAYS Performed at [facility]    Report Status PENDING  Incomplete  Blood Culture (routine x 2)     Status: None (Preliminary result)   Collection Time: 08/16/15  1:10 PM  Result Value Ref Range Status   Specimen Description BLOOD LEFT FOREARM  Final   Special Requests BOTTLES DRAWN AEROBIC AND ANAEROBIC 5CC  Final   Culture   Final    NO GROWTH 3 DAYS Performed at [facility]    Report Status PENDING  Incomplete  Urine culture     Status: None   Collection Time: 08/16/15  1:10 PM  Result Value Ref Range Status   Specimen Description URINE, CLEAN CATCH  Final   Special Requests NONE  Final   Culture   Final    >=100,000 COLONIES/mL KLEBSIELLA PNEUMONIAE Performed at [facility]    Report Status 08/19/2015 FINAL  Final    Organism ID, Bacteria KLEBSIELLA PNEUMONIAE  Final      Susceptibility   Klebsiella pneumoniae - MIC*    AMPICILLIN >=32 RESISTANT Resistant     CEFAZOLIN <=4 SENSITIVE Sensitive     CEFTRIAXONE <=1 SENSITIVE Sensitive     CIPROFLOXACIN <=0.25 SENSITIVE Sensitive     GENTAMICIN <=1 SENSITIVE Sensitive     IMIPENEM <=0.25 SENSITIVE Sensitive     NITROFURANTOIN 32 SENSITIVE Sensitive     TRIMETH/SULFA <=20 SENSITIVE Sensitive     AMPICILLIN/SULBACTAM >=32 RESISTANT Resistant     PIP/TAZO 32 INTERMEDIATE Intermediate     * >=100,000 COLONIES/mL KLEBSIELLA PNEUMONIAE  MRSA PCR Screening     Status: None   Collection Time: 08/16/15  5:06 PM  Result Value Ref Range Status   MRSA by PCR NEGATIVE NEGATIVE Final    Comment:        The GeneXpert MRSA Assay (FDA approved for NASAL specimens only), is one component of a comprehensive MRSA colonization surveillance program. It is not intended to diagnose MRSA infection nor to guide or monitor treatment for MRSA infections.   Culture, sputum-assessment     Status: None   Collection Time: 08/16/15  7:17 PM  Result Value Ref Range Status   Specimen Description SPUTUM  Final   Special Requests NONE  Final   Sputum evaluation   Final    THIS SPECIMEN IS ACCEPTABLE. RESPIRATORY CULTURE REPORT TO FOLLOW.   Report Status 08/16/2015 FINAL  Final  Culture, respiratory (NON-Expectorated)     Status: None   Collection Time: 08/16/15  8:52 PM  Result Value Ref Range Status   Specimen Description SPUTUM  Final   Special Requests NONE  Final   Gram Stain   Final    ABUNDANT WBC PRESENT,BOTH PMN AND MONONUCLEAR NO SQUAMOUS EPITHELIAL CELLS SEEN RARE GRAM NEGATIVE RODS Performed at [facility]    Culture   Final    NORMAL OROPHARYNGEAL FLORA Performed at [facility]    Report Status 08/19/2015 FINAL  Final     Studies: No results found.  Scheduled Meds: . ALPRAZolam  0.5 mg Oral QHS  . dextromethorphan-guaiFENesin  2  tablet Oral BID  . divalproex  500 mg Oral QHS  . enoxaparin (LOVENOX) injection  40 mg Subcutaneous Q24H  . famotidine  20 mg Oral QHS  . imipenem-cilastatin  500 mg Intravenous Q8H  . insulin aspart  0-9 Units Subcutaneous TID WC  . ipratropium  2  spray Each Nare BID  . ipratropium-albuterol  3 mL Nebulization Q6H  . nortriptyline  100 mg Oral QHS  . pantoprazole  40 mg Oral BID AC  . polyethylene glycol  17 g Oral Daily  . saccharomyces boulardii  250 mg Oral BID  . senna  1 tablet Oral Daily  . sodium chloride  3 mL Intravenous Q12H  . vancomycin  1,000 mg Intravenous Q12H   Continuous Infusions:   Time Spent: 15 min   FELIZ Marguarite Arbour  [facility] Pager [phone]. If 7PM-7AM, please contact night-coverage at [email or web address], password [facility] 08/20/2015, 8:43 AM  LOS: 4 days

## 2015-08-20 NOTE — Progress Notes (Signed)
Medical Team,  Catherine Kelley has had frequent restarts and has limited peripheral access.  If you agreeshe would benefit from having a PICC line, then please order a picc to be inserted.  She reports she has had one in the past.  Thank you, Brandon Melnick, RN  IV Therapy.

## 2015-08-20 NOTE — Progress Notes (Signed)
Patient ambulated in hall without oxygen and maintained an oxygen saturation level of 91-95%. Patient tolerated ambulating in hall well.

## 2015-08-20 NOTE — Progress Notes (Signed)
   Name: Catherine Kelley MRN: [id number] DOB: 09/09/1951    ADMISSION DATE:  08/16/2015 CONSULTATION DATE:  08/17/2015  REFERRING MD :  Dr. Ortiz  CHIEF COMPLAINT:  Pneumonia/Bronchiectasis  BRIEF PATIENT DESCRIPTION: 64-year-old female with known underlying COPD & emphysema. Patient also has known history of tobacco use. She was admitted to hospital in July 13 2014 with necrotizing right lower lobe pneumonia and had prolonged course of antibiotics. Serum ANA was positive but rheumatoid factor negative. Has been unable to afford inhaler medications other than her nebulizer.  SIGNIFICANT EVENTS  August 2015 - Necrotizing RLL Pneumonia 08/16/15  - Admitted with RML & RLL Pneumonia  STUDIES:  CT Chest W/O 9/15: 2 opacification & consolidation most densely in the right lower lobe and also present within the right middle lobe. Some groundglass opacity within the right upper lobe as well. No appreciated pleural effusion. Patchy dilated esophagus the level of the cervical spine.   SUBJECTIVE:  Feels a bit better today  VITAL SIGNS: Temp:  [98.6 F (37 C)] 98.6 F (37 C) (09/18 1501) Pulse Rate:  [111] 111 (09/18 1501) Resp:  [20] 20 (09/18 1501) BP: (121)/(60) 121/60 mmHg (09/18 1501) SpO2:  [88 %-94 %] 90 % (09/19 0925)  FIO2  RA   PHYSICAL EXAMINATION: Gen: no distress in bed  HENT: OP clear, neck supple PULM: Crackles R base, good air movement CV: RRR, no mgr, trace edema GI: BS+, soft, nontender Derm: no cyanosis or rash Psyche: normal mood and affect      Recent Labs Lab 08/18/15 1554 08/19/15 0534 08/20/15 0605  NA 130* 131* 131*  K 3.9 3.8 3.1*  CL 95* 95* 94*  CO2 31 29 28  BUN 7 7 6  CREATININE 0.41* 0.46 <0.30*  GLUCOSE 114* 94 98    Recent Labs Lab 08/16/15 1100 08/17/15 0413  HGB 10.7* 9.9*  HCT 30.2* 29.7*  WBC 14.7* 8.6  PLT 232 203   No results found. MICROBIOLOGY 08/16/15 -Urine Legionella Ag:  negative -Urine Streptococcal Ag:   Negative -Blood Ctx:  Negative -Respiratory Ctx: OPF  LABS 07/12/15 -ESR: 44 -ANA: 1:80 (POSITIVE) -RF:  <10  08/03/14 -ANA: 1:80 (POSITIVE) -Anti-CCP:  <2.0 -MPO:  <1.0 -PR3:  <1.0 -RF:  <10 -SCL70:  <1.0  08/17/15 SCL -70 repeat negative 08/17/15 centromere- negative   DG esophagus normal  ASSESSMENT / PLAN:  1. Pneumonia in pt with documented bronchiectasis esp RLL: Sputum culture non-conclusive.  -stop vanc/imipenem -start augmentin, plan 10 days total antibiotics -continue flutter valve -out of bed today, I encouraged this -will need repeat CXR in 3-4 weeks to ensure resolution of cough  2. Acute hypoxic respiratory failure, resolved, pt now RA  3. COPD GOLD II (barely actually meets the criteria so only moderate at worst) - PFT's 06/06/2015  FEV1 1.51 (67 % ) ratio 69  p no % improvement from saba with DLCO  70 % corrects to 101 % for alv volume   rec  Duonebs every 4 hours. Continue to use albuterol via nebulizer as needed for coughing or wheezing.  4. GERD with dilated esophagus: Checking barium swallow  9/16 No significant findings. No evidence of aspiration or reflux.No significant findings. No evidence of aspiration or reflux BID PPI Recomemended by Dr. Wert    5. Cigarette smoking:   ? Resolved 3 weeks PTA    Brent McQuaid, MD [facility] Pager: [phone] Cell: [phone] After 3pm or if no response, call [phone]     

## 2015-08-21 LAB — BASIC METABOLIC PANEL
ANION GAP: 9 (ref 5–15)
BUN: 6 mg/dL (ref 6–20)
CHLORIDE: 96 mmol/L — AB (ref 101–111)
CO2: 25 mmol/L (ref 22–32)
Calcium: 8.4 mg/dL — ABNORMAL LOW (ref 8.9–10.3)
Creatinine, Ser: 0.48 mg/dL (ref 0.44–1.00)
GFR calc Af Amer: >60 mL/min (ref >60)
GFR calc non Af Amer: >60 mL/min (ref >60)
GLUCOSE: 91 mg/dL (ref 65–99)
POTASSIUM: 4.1 mmol/L (ref 3.5–5.1)
Sodium: 130 mmol/L — ABNORMAL LOW (ref 135–145)

## 2015-08-21 LAB — CULTURE, BLOOD (ROUTINE X 2)
CULTURE: NO GROWTH
Culture: NO GROWTH

## 2015-08-21 LAB — GLUCOSE, CAPILLARY
Glucose-Capillary: 98 mg/dL (ref 65–99)
Glucose-Capillary: 100 mg/dL — ABNORMAL HIGH (ref 65–99)

## 2015-08-21 MED ORDER — LOSARTAN POTASSIUM 100 MG PO TABS
100.0000 mg | ORAL_TABLET | Freq: Every day | ORAL | Status: DC
Start: 2015-08-21 — End: 2015-10-17

## 2015-08-21 MED ORDER — SACCHAROMYCES BOULARDII 250 MG PO CAPS: 250.0000 mg | ORAL_CAPSULE | Freq: Two times a day (BID) | ORAL | Status: DC

## 2015-08-21 MED ORDER — AMOXICILLIN-POT CLAVULANATE 875-125 MG PO TABS: 1.0000 | ORAL_TABLET | Freq: Two times a day (BID) | ORAL | Status: DC

## 2015-08-21 NOTE — Progress Notes (Signed)
Assessment unchanged. Pt and husband verbalized understanding of dc instructions through teach back including follow up care, meds and activity. Scripts x 3 given as provided by MD. Discharged via wc to front entrance to meet awaiting vehicle to carry home. Accompanied by NT and husband.

## 2015-08-21 NOTE — Evaluation (Signed)
Physical Therapy One Time Evaluation Patient Details Name: Catherine Kelley MRN: [id number] DOB: 13-Feb-1951 Today's Date: 08/21/2015   History of Present Illness  64 year old female with hx of COPD, emphysema, tobacco use. She was admitted to hospital in July 13 2014 with necrotizing right lower lobe pneumonia and had prolonged course of antibiotics.  Pt admitted 9/15 for Severe sepsis without septic shock/Acute respiratory failure with hypoxia due to CAP (community acquired pneumonia  Clinical Impression  Patient evaluated by Physical Therapy with no further acute PT needs identified. All education has been completed and the patient has no further questions. Pt mobilizing well however a little slower [place] then usual.  Pt reports feeling limited endurance so encouraged mobility often at home but as tolerated with rest breaks if needed. See below for any follow-up Physical Therapy or equipment needs. PT is signing off. Thank you for this referral.        Follow Up Recommendations No PT follow up    Equipment Recommendations  None recommended by PT    Recommendations for Other Services       Precautions / Restrictions Precautions Precautions: None Restrictions Weight Bearing Restrictions: No      Mobility  Bed Mobility Overal bed mobility: Modified Independent                Transfers Overall transfer level: Modified independent                  Ambulation/Gait Ambulation/Gait assistance: Supervision;Modified independent (Device/Increase time) Ambulation Distance (Feet): 220 Feet Assistive device: None Gait Pattern/deviations: Step-through pattern;Decreased stride length     General Gait Details: slow [place], pt reports slower and more cautious then her baseline (reports she was ambulating with RW with nursing however no assistive device used during session), SpO2 93% room air during gait  Stairs            Wheelchair Mobility    Modified Rankin (Stroke  Patients Only)       Balance                                             Pertinent Vitals/Pain Pain Assessment: No/denies pain    Home Living Family/patient expects to be discharged to:: Private residence Living Arrangements: Spouse/significant other Available Help at Discharge: Family;Available PRN/intermittently Type of Home: House Home Access: Stairs to enter Entrance Stairs-Rails: Psychiatric nurse of Steps: 5 Home Layout: One level Home Equipment: Bedside commode      Prior Function Level of Independence: Independent         Comments: works full time at [employer]        Extremity/Trunk Assessment   Upper Extremity Assessment: Overall WFL for tasks assessed           Lower Extremity Assessment: Overall WFL for tasks assessed      Cervical / Trunk Assessment: Normal  Communication   Communication: No difficulties  Cognition Arousal/Alertness: Awake/alert Behavior During Therapy: WFL for tasks assessed/performed Overall Cognitive Status: Within Functional Limits for tasks assessed                      General Comments      Exercises        Assessment/Plan    PT Assessment Patent does not need any further PT services  PT Diagnosis  PT Problem List    PT Treatment Interventions     PT Goals (Current goals can be found in the Care Plan section) Acute Rehab PT Goals PT Goal Formulation: All assessment and education complete, DC therapy    Frequency     Barriers to discharge        Co-evaluation               End of Session   Activity Tolerance: Patient tolerated treatment well Patient left: in bed;with call bell/phone within reach           Time: 0957-1007 PT Time Calculation (min) (ACUTE ONLY): 10 min   Charges:   PT Evaluation $Initial PT Evaluation Tier I: 1 Procedure     PT G Codes:        Catherine Kelley 08/21/2015, 10:35 AM Carmelia Bake,  PT, DPT 08/21/2015 Pager: [phone]

## 2015-08-21 NOTE — Discharge Summary (Signed)
Physician Discharge Summary  Catherine Kelley:[id number] DOB: [birth year] DOA: 08/16/2015  PCP: [name], MD  Admit date: 08/16/2015 Discharge date: 08/21/2015  Time spent: 35 minutes  Recommendations for Outpatient Follow-up:  1. Follow-up with pulmonary in 3-4 weeks for repeat chest x-ray will probably need a BAL. 2. Follow-up with primary care doctor in 2-4 weeks check blood pressure and titrate antihypertensive medications as tolerated. Her hydrochlorothiazide was DC'd issue continue on losartan and Lasix.  Discharge Diagnoses:  Principal Problem:   Severe sepsis Active Problems:   Benign hypertensive heart disease without heart failure   CAP (community acquired pneumonia)   Acute respiratory failure with hypoxia   Hyponatremia   COPD, moderate   Bronchiectasis, non-tuberculous   Rhabdomyolysis   Severe sepsis without septic shock   Discharge Condition: stable  Diet recommendation: heart healthy  Filed Weights   08/16/15 1336 08/16/15 1708  Weight: 63.957 kg (141 lb) 65.7 kg (144 lb 13.5 oz)    History of present illness:  64 year old female with past medical history of hypertension admitted about a year of her right lower lobe pneumonia and septic shock extensive workup was unrevealing at that time CT scan showed necrotizing features. Comes in today complaining of generalized weakness for 4 days with cough and shortness of breath with productive sputum and fever.  Hospital Course:  Severe sepsis without septic shock/acute respiratory failure with hypoxia due to community-acquired pneumonia: She was started empirically on bank any dependent she defervesced and leukocytosis resolved pulmonary was consulted they recommended to continue IV antibiotic for 5 days then she was D escalated to document interval she'll continue as an outpatient. Follow-up with pulmonary in 3-4 weeks will need a repeat CT scan at that time. May need a BAL as an  outpatient.  Rhabdomyolysis: Nontraumatic does resolve with IV fluids.  Hyponatremia: There is likely due to prerenal in etiology, there is overlapping fluid hydration.  Benign hypertensive heart disease without heart failure: Initially her high anti-hypertensive medications were held on admission. She was on 2 diuretics her hydrochlorothiazide was stopped she will go home on losartan and Lasix. Also with potassium supplements.  Constipation: Does resolve MiraLAX.   Consultants:  pulmonmary  Procedures:  CT chest  Discharge Exam: Filed Vitals:   08/21/15 0514  BP: 147/68  Pulse: 105  Temp: 99.1 F (37.3 C)  Resp: 16    General: Alert awake and oriented 3 Cardiovascular: Regular rate and rhythm Respiratory: Good air movement and clear to auscultation  Discharge Instructions   Discharge Instructions    Diet - low sodium heart healthy    Complete by:  As directed      Increase activity slowly    Complete by:  As directed           Current Discharge Medication List    START taking these medications   Details  amoxicillin-clavulanate (AUGMENTIN) 875-125 MG per tablet Take 1 tablet by mouth every 12 (twelve) hours. Qty: 30 tablet, Refills: 0    saccharomyces boulardii (FLORASTOR) 250 MG capsule Take 1 capsule (250 mg total) by mouth 2 (two) times daily. Qty: 30 capsule, Refills: 0      CONTINUE these medications which have NOT CHANGED   Details  albuterol (PROVENTIL HFA;VENTOLIN HFA) 108 (90 BASE) MCG/ACT inhaler Inhale 2 puffs into the lungs every 6 (six) hours as needed for wheezing or shortness of breath. Qty: 1 Inhaler, Refills: 5    albuterol (PROVENTIL) (2.5 MG/3ML) 0.083% nebulizer solution Take 3 mLs (2.5 mg  total) by nebulization every 3 (three) hours as needed for wheezing or shortness of breath. Qty: 75 mL, Refills: 12    ALPRAZolam (XANAX) 0.5 MG tablet Take 0.5 mg by mouth at bedtime.     Calcium Carbonate-Vitamin D (CALCIUM-VITAMIN D)  500-200 MG-UNIT per tablet Take 1 tablet by mouth daily.    divalproex (DEPAKOTE ER) 500 MG 24 hr tablet Take 500 mg by mouth at bedtime.     fluticasone (FLONASE) 50 MCG/ACT nasal spray Place 2 sprays into both nostrils daily. Qty: 16 g, Refills: 2    !! furosemide (LASIX) 20 MG tablet TAKE 2 TABLETS BY MOUTH EVERY DAY Qty: 180 tablet, Refills: 2    guaiFENesin (MUCINEX) 600 MG 12 hr tablet Take 600 mg by mouth 2 (two) times daily as needed for cough or to loosen phlegm.     ipratropium (ATROVENT) 0.03 % nasal spray Place 2 sprays into both nostrils 2 (two) times daily. Qty: 30 mL, Refills: 11    Multiple Vitamin (MULTIVITAMIN) tablet Take 1 tablet by mouth daily.    nadolol (CORGARD) 40 MG tablet TAKE 1/2 TABLET BY MOUTH EVERY DAY Qty: 45 tablet, Refills: 3    nortriptyline (PAMELOR) 50 MG capsule Take 100 mg by mouth at bedtime.    Umeclidinium-Vilanterol (ANORO ELLIPTA) 62.5-25 MCG/INH AEPB Inhale 1 puff into the lungs daily.    benzonatate (TESSALON) 100 MG capsule TAKE 1 TO 2 CAPSULES BY MOUTH 3 TIMES A DAY AS NEEDED FOR COUGH Qty: 60 capsule, Refills: 2    doxycycline (VIBRAMYCIN) 100 MG capsule Take 1 capsule (100 mg total) by mouth 2 (two) times daily. Qty: 20 capsule, Refills: 0    !! furosemide (LASIX) 20 MG tablet TAKE 2 TABLETS BY MOUTH EVERY DAY Qty: 180 tablet, Refills: 1    HYDROcodone-acetaminophen (NORCO/VICODIN) 5-325 MG per tablet Take 1 tablet by mouth every 6 (six) hours as needed for moderate pain. Qty: 30 tablet, Refills: 0    potassium chloride SA (K-DUR,KLOR-CON) 20 MEQ tablet Take 1 tablet (20 mEq total) by mouth daily. Qty: 7 tablet, Refills: 0    tiZANidine (ZANAFLEX) 4 MG tablet Take 1 tablet (4 mg total) by mouth every 6 (six) hours as needed for muscle spasms. Qty: 40 tablet, Refills: 0     !! - Potential duplicate medications found. Please discuss with provider.    STOP taking these medications     ibuprofen (ADVIL,MOTRIN) 200 MG tablet       losartan-hydrochlorothiazide (HYZAAR) 50-12.5 MG per tablet        Allergies  Allergen Reactions  . Ceclor [Cefaclor] Hives  . Escitalopram Oxalate     Pt does not recall ever taking medication  . Sertraline Hcl     UNKNOWN  . Sulfa Drugs Cross Reactors Hives and Rash    Hives and rash   Follow-up Information    Follow up with SMITH,KRISTI, MD In 5 weeks.   Specialty:  Family Medicine   Why:  Hospital follow-up   Contact information:   [place] [address] [phone]        The results of significant diagnostics from this hospitalization (including imaging, microbiology, ancillary and laboratory) are listed below for reference.    Significant Diagnostic Studies: Dg Chest 2 View  08/16/2015   CLINICAL DATA:  Dizziness  EXAM: CHEST  2 VIEW  COMPARISON:  10/2015  FINDINGS: Consolidation in the right middle and lower lobes is worse. Normal heart size. Left lung is clear. No pneumothorax.  No pleural effusion.  IMPRESSION: Worsening right middle and lower lobe consolidation. Consider atypical organism or underlying malignancy.   Electronically Signed   By: Marybelle Killings M.D.   On: 08/16/2015 12:10   Ct Chest Wo Contrast  08/17/2015   CLINICAL DATA:  Pneumonia with productive cough and fevers  EXAM: CT CHEST WITHOUT CONTRAST  TECHNIQUE: Multidetector CT imaging of the chest was performed following the standard protocol without IV contrast.  COMPARISON:  Chest radiograph August 16, 2015; chest CT February 07, 2015  FINDINGS: There is extensive airspace consolidation throughout most of the right middle and lower lobes. There is ground-glass type opacity throughout much of the anterior segment of the right upper lobe as well as portions of the apical segment right upper lobe, also felt to be consistent with pneumonia. On the left, there is mild posterior basilar atelectasis. No consolidation is seen on the left.  There is atherosclerotic change in the aorta but no  aneurysm. There is mild atherosclerotic calcification in the visualized great vessels. Visualized thyroid appears unremarkable. There are a few mildly prominent lymph nodes. There is a lymph node just anterior to the carina measuring 1.7 x 1.6 cm. There is a sub- carinal lymph node measuring 1.9 x 1.8 cm.  There are scattered foci of coronary artery calcification. The pericardium is not thickened.  Visualized upper abdominal structures appear unremarkable except for atherosclerotic calcification in the upper abdominal aorta.  There are no blastic or lytic bone lesions.  IMPRESSION: Widespread airspace consolidation throughout the right middle and lower lobes. Ground-glass type opacity, felt to represent pneumonia, in portions of the anterior apical segments of the right upper lobe.  Mild adenopathy, likely of reactive etiology given the degree of infiltrate present.  Scattered foci of coronary artery calcification.   Electronically Signed   By: Lowella Grip III M.D.   On: 08/17/2015 07:11   Dg Esophagus  08/17/2015   CLINICAL DATA:  Pneumonia with dysphagia. Evaluate for reflux. Initial encounter.  EXAM: ESOPHOGRAM/BARIUM SWALLOW  TECHNIQUE: Single contrast examination was performed using  thin barium.  FLUOROSCOPY TIME:  Radiation Exposure Index (as provided by the fluoroscopic device):  If the device does not provide the exposure index:  Fluoroscopy Time:  44 seconds.  Number of Acquired Images: 1 rapid sequence series consisting of 9 images. Additional images acquired with fluoro store.  COMPARISON:  Chest CT 08/16/2015.  FINDINGS: The esophageal motility is within normal limits. There is no evidence of stricture, mass or ulceration. Rapid sequence imaging of the pharynx in the lateral projection demonstrates no mucosal abnormalities.  No gastroesophageal reflux was elicited with the water siphon test. At the conclusion of the study, a 13 mm barium tablet was administered. This passed without delay into  the stomach.  IMPRESSION: No significant findings.  No evidence of aspiration or reflux.   Electronically Signed   By: Richardean Sale M.D.   On: 08/17/2015 12:48    Microbiology: Recent Results (from the past 240 hour(s))  Blood Culture (routine x 2)     Status: None (Preliminary result)   Collection Time: 08/16/15 12:56 PM  Result Value Ref Range Status   Specimen Description BLOOD RIGHT WRIST  Final   Special Requests BOTTLES DRAWN AEROBIC AND ANAEROBIC 5CC  Final   Culture   Final    NO GROWTH 4 DAYS Performed at [facility]    Report Status PENDING  Incomplete  Blood Culture (routine x 2)     Status: None (  Preliminary result)   Collection Time: 08/16/15  1:10 PM  Result Value Ref Range Status   Specimen Description BLOOD LEFT FOREARM  Final   Special Requests BOTTLES DRAWN AEROBIC AND ANAEROBIC 5CC  Final   Culture   Final    NO GROWTH 4 DAYS Performed at [facility]    Report Status PENDING  Incomplete  Urine culture     Status: None   Collection Time: 08/16/15  1:10 PM  Result Value Ref Range Status   Specimen Description URINE, CLEAN CATCH  Final   Special Requests NONE  Final   Culture   Final    >=100,000 COLONIES/mL KLEBSIELLA PNEUMONIAE Performed at [facility]    Report Status 08/19/2015 FINAL  Final   Organism ID, Bacteria KLEBSIELLA PNEUMONIAE  Final      Susceptibility   Klebsiella pneumoniae - MIC*    AMPICILLIN >=32 RESISTANT Resistant     CEFAZOLIN <=4 SENSITIVE Sensitive     CEFTRIAXONE <=1 SENSITIVE Sensitive     CIPROFLOXACIN <=0.25 SENSITIVE Sensitive     GENTAMICIN <=1 SENSITIVE Sensitive     IMIPENEM <=0.25 SENSITIVE Sensitive     NITROFURANTOIN 32 SENSITIVE Sensitive     TRIMETH/SULFA <=20 SENSITIVE Sensitive     AMPICILLIN/SULBACTAM >=32 RESISTANT Resistant     PIP/TAZO 32 INTERMEDIATE Intermediate     * >=100,000 COLONIES/mL KLEBSIELLA PNEUMONIAE  MRSA PCR Screening     Status: None   Collection Time: 08/16/15  5:06  PM  Result Value Ref Range Status   MRSA by PCR NEGATIVE NEGATIVE Final    Comment:        The GeneXpert MRSA Assay (FDA approved for NASAL specimens only), is one component of a comprehensive MRSA colonization surveillance program. It is not intended to diagnose MRSA infection nor to guide or monitor treatment for MRSA infections.   Culture, sputum-assessment     Status: None   Collection Time: 08/16/15  7:17 PM  Result Value Ref Range Status   Specimen Description SPUTUM  Final   Special Requests NONE  Final   Sputum evaluation   Final    THIS SPECIMEN IS ACCEPTABLE. RESPIRATORY CULTURE REPORT TO FOLLOW.   Report Status 08/16/2015 FINAL  Final  Culture, respiratory (NON-Expectorated)     Status: None   Collection Time: 08/16/15  8:52 PM  Result Value Ref Range Status   Specimen Description SPUTUM  Final   Special Requests NONE  Final   Gram Stain   Final    ABUNDANT WBC PRESENT,BOTH PMN AND MONONUCLEAR NO SQUAMOUS EPITHELIAL CELLS SEEN RARE GRAM NEGATIVE RODS Performed at [facility]    Culture   Final    NORMAL OROPHARYNGEAL FLORA Performed at [facility]    Report Status 08/19/2015 FINAL  Final     Labs: Basic Metabolic Panel:  Recent Labs Lab 08/16/15 2230 08/17/15 0413 08/18/15 1554 08/19/15 0534 08/20/15 0605 08/21/15 0534  NA  --  131* 130* 131* 131* 130*  K  --  2.7* 3.9 3.8 3.1* 4.1  CL  --  95* 95* 95* 94* 96*  CO2  --  '31 31 29 28 25  '$ GLUCOSE  --  129* 114* 94 98 91  BUN  --  '7 7 7 6 6  '$ CREATININE  --  0.39* 0.41* 0.46 <0.30* 0.48  CALCIUM  --  7.7* 8.1* 8.5* 8.2* 8.4*  MG 1.6*  --   --   --   --   --  Liver Function Tests: No results for input(s): AST, ALT, ALKPHOS, BILITOT, PROT, ALBUMIN in the last 168 hours. No results for input(s): LIPASE, AMYLASE in the last 168 hours. No results for input(s): AMMONIA in the last 168 hours. CBC:  Recent Labs Lab 08/16/15 1100 08/17/15 0413  WBC 14.7* 8.6  HGB 10.7* 9.9*   HCT 30.2* 29.7*  MCV 84.6 87.1  PLT 232 203   Cardiac Enzymes:  Recent Labs Lab 08/16/15 1256  CKTOTAL 650*   BNP: BNP (last 3 results) No results for input(s): BNP in the last 8760 hours.  ProBNP (last 3 results) No results for input(s): PROBNP in the last 8760 hours.  CBG:  Recent Labs Lab 08/20/15 0803 08/20/15 1146 08/20/15 1659 08/20/15 2120 08/21/15 0743  GLUCAP 98 99 92 112* 98       Signed:  FELIZ ORTIZ, ABRAHAM  [facility] 08/21/2015, 8:09 AM

## 2015-08-23 ENCOUNTER — Encounter (HOSPITAL_BASED_OUTPATIENT_CLINIC_OR_DEPARTMENT_OTHER): Payer: Self-pay | Admitting: Emergency Medicine

## 2015-08-24 DIAGNOSIS — Z0271 Encounter for disability determination: Secondary | ICD-10-CM

## 2015-09-13 ENCOUNTER — Ambulatory Visit (INDEPENDENT_AMBULATORY_CARE_PROVIDER_SITE_OTHER): Payer: Managed Care, Other (non HMO) | Admitting: Emergency Medicine

## 2015-09-13 ENCOUNTER — Ambulatory Visit (INDEPENDENT_AMBULATORY_CARE_PROVIDER_SITE_OTHER): Payer: Managed Care, Other (non HMO)

## 2015-09-13 VITALS — BP 128/80 | HR 97 | Temp 97.6°F | Resp 16 | Ht 63.0 in | Wt 136.0 lb

## 2015-09-13 DIAGNOSIS — M5417 Radiculopathy, lumbosacral region: Secondary | ICD-10-CM

## 2015-09-13 DIAGNOSIS — Z720 Tobacco use: Secondary | ICD-10-CM | POA: Diagnosis not present

## 2015-09-13 DIAGNOSIS — W19XXXA Unspecified fall, initial encounter: Secondary | ICD-10-CM | POA: Diagnosis not present

## 2015-09-13 DIAGNOSIS — I10 Essential (primary) hypertension: Secondary | ICD-10-CM

## 2015-09-13 DIAGNOSIS — M5416 Radiculopathy, lumbar region: Secondary | ICD-10-CM

## 2015-09-13 DIAGNOSIS — J189 Pneumonia, unspecified organism: Secondary | ICD-10-CM

## 2015-09-13 DIAGNOSIS — Z716 Tobacco abuse counseling: Secondary | ICD-10-CM | POA: Diagnosis not present

## 2015-09-13 MED ORDER — ALBUTEROL SULFATE HFA 108 (90 BASE) MCG/ACT IN AERS: 2.0000 | INHALATION_SPRAY | Freq: Four times a day (QID) | RESPIRATORY_TRACT | Status: DC | PRN

## 2015-09-13 MED ORDER — LEVOFLOXACIN 500 MG PO TABS: 500.0000 mg | ORAL_TABLET | Freq: Every day | ORAL | Status: DC

## 2015-09-13 MED ORDER — TIZANIDINE HCL 4 MG PO TABS: 4.0000 mg | ORAL_TABLET | Freq: Four times a day (QID) | ORAL | Status: DC | PRN

## 2015-09-13 MED ORDER — HYDROCODONE-ACETAMINOPHEN 5-325 MG PO TABS: 1.0000 | ORAL_TABLET | Freq: Four times a day (QID) | ORAL | Status: DC | PRN

## 2015-09-13 NOTE — Progress Notes (Signed)
Subjective:    Patient ID: Catherine Kelley, female    DOB: [birth year], 64 y.o.   MRN: [id number]  HPI Patient for repeat CXR and right back/hip/leg.  Was hospitalized for pneumonia and advised to f/u with pulmonology for BAL and CXR in 4 weeks and with PCP for BP check. Has completed antibiotic and thinks that tessalon helps with cough. States that she has had productive cough along with HA at times, but denies fever, sore throat, SOB, wheezing, congestion, or N/V. Has returned to work, but still not feeling 100%. Started smoking again yesterday after quitting for 1 month. Has had pneumonia several times in the past 2 years.   Has had right sided lower back pain for several years. Pain starts at lower back right above buttocks and radiates to right thigh and is achy to sharp in quality. Pain waxes and wanes, but current flare is not resolved by taking Advil. States has a little limp and walking makes pain worse. Denies swelling, numbness,weakness, tingling, or loss of fxn/ROM/sensation of back, lower extremity, or foot. States that she fell again one week ago and pain has increased since then. Denies incontinence.   Per ED provider's note patient's HCTZ was discontinued due to hypotension. Patient has not restarted and is just taking losartan and lasix. Is not checking BP at home, but says that before she was admitted to the hospital diastolic # was in the 41D and she felt bad which she thinks is when she fell.   Allergies  Allergen Reactions  . Ceclor [Cefaclor] Hives  . Escitalopram Oxalate     Pt does not recall ever taking medication  . Sertraline Hcl     UNKNOWN  . Sulfa Drugs Cross Reactors Hives and Rash    Hives and rash   Past Medical History  Diagnosis Date  . Hypertension     essential hypertension  . Tobacco abuse     smoking about 1/2 pk of cigarettes a day  . Anxiety   . Depression     psychiatrist Dr. Toy Care  . Respiratory infection   . Shortness of breath   . Malaise     . Lightheadedness   . Emphysematous COPD ([place])     changes in right base along with patchy areas on last x-ray  . COPD (chronic obstructive pulmonary disease) ([place])   . History of echocardiogram 12/24/2006    Est. EF of 62-22% NORMAL LV SYSTOLIC FUNCTION WITH IMPAIRED RELAXATION -- MILD AORTIC SCLEROSIS -- NORMAL PALONARY ARTERY PRESSURE -- NO OLD ECHOS FOR COMPARISON -- [name], MD  . History of cardiovascular stress test 08/15/2004    EF of 70% -- Normal stress cardiolite.  There is no evidence of ischemia and there is normal LV function. -- [name]. MD  . Diastolic dysfunction   . Allergy   . Asthma   . Heart murmur   . Pneumonia     "several times since May 2015" (07/06/2014)  . Chronic bronchitis ([place])     "get it alot; maybe not q yr" (07/06/2014)  . GERD (gastroesophageal reflux disease)     Review of Systems As noted above.    Objective:   Physical Exam  Constitutional: She is oriented to person, place, and time. She appears well-developed and well-nourished. No distress.  Blood pressure 128/80, pulse 97, temperature 97.6 F (36.4 C), temperature source Oral, resp. rate 16, height '5\' 3"'$  (1.6 m), weight 136 lb (61.689 kg), SpO2 95 %.  HENT:  Head: Normocephalic and atraumatic.  Right Ear: External ear normal.  Left Ear: External ear normal.  Nose: Nose normal.  Mouth/Throat: Oropharynx is clear and moist. No oropharyngeal exudate.  Eyes: Conjunctivae are normal. Pupils are equal, round, and reactive to light. Right eye exhibits no discharge. Left eye exhibits no discharge. No scleral icterus.  Neck: Normal range of motion. Neck supple. No thyromegaly present.  Cardiovascular: Normal rate, regular rhythm and normal heart sounds.  Exam reveals no gallop and no friction rub.   No murmur heard. Pulmonary/Chest: Effort normal. No respiratory distress. She has no decreased breath sounds. She has no wheezes. She has no rhonchi. She has rales in the right middle  field, the right lower field, the left middle field and the left lower field. She exhibits no tenderness.  Abdominal: Soft. Bowel sounds are normal. She exhibits no distension. There is no tenderness. There is no rebound and no guarding.  Musculoskeletal: She exhibits edema (1+ bilateral edema- baseline) and tenderness.       Right hip: She exhibits decreased strength and tenderness. She exhibits normal range of motion, no bony tenderness, no swelling, no crepitus and no deformity.       Left hip: Normal.       Thoracic back: Normal.       Lumbar back: She exhibits decreased range of motion, tenderness and pain. She exhibits no bony tenderness, no swelling, no edema, no deformity, no laceration and no spasm.       Back:       Right upper leg: She exhibits tenderness. She exhibits no bony tenderness, no swelling, no edema, no deformity and no laceration.       Left upper leg: Normal.  Lymphadenopathy:    She has no cervical adenopathy.  Neurological: She is alert and oriented to person, place, and time. She has normal strength and normal reflexes. She displays no atrophy. No cranial nerve deficit or sensory deficit. She exhibits normal muscle tone. Coordination normal.  Skin: Skin is warm and dry. No rash noted. She is not diaphoretic. No erythema. No pallor.  Psychiatric: She has a normal mood and affect. Judgment and thought content normal.    [facility] reading (PRIMARY) by  Dr. Ouida Sills. CXR: pneumonia improved, but still present.  [facility] reading (PRIMARY) by  Dr. Ouida Sills. Hip: Degenerative changes.     Assessment & Plan:  1. CAP (community acquired pneumonia) Advised that she needs to make an appt with her pulmonologist and she agrees to do so soon. Can continue tessalon if feels that it helps. Increase fluid intake. Compared to previous CXR there is some improvement. From sounds of impression from chest CT in a much better place than one month ago, but still has a ways to go. - DG Chest 2  View; Future - albuterol (PROVENTIL HFA;VENTOLIN HFA) 108 (90 BASE) MCG/ACT inhaler; Inhale 2 puffs into the lungs every 6 (six) hours as needed for wheezing or shortness of breath.  Dispense: 1 Inhaler; Refill: 5 - levofloxacin (LEVAQUIN) 500 MG tablet; Take 1 tablet (500 mg total) by mouth daily.  Dispense: 10 tablet; Refill: 0  2. Lumbar back pain with radiculopathy affecting right lower extremity 3. Fall, initial encounter Back exercises discussed. Can continue ibuprofen as anti-inflammatory. If not improvement may need to refer to ortho. - DG HIP UNILAT W OR W/O PELVIS 2-3 VIEWS RIGHT; Future - HYDROcodone-acetaminophen (NORCO/VICODIN) 5-325 MG tablet; Take 1 tablet by mouth every 6 (six) hours as needed for moderate pain.  Dispense: 30 tablet; Refill: 0 - tiZANidine (ZANAFLEX) 4 MG tablet; Take 1 tablet (4 mg total) by mouth every 6 (six) hours as needed for muscle spasms.  Dispense: 40 tablet; Refill: 0  4. Essential hypertension At this time should just stay on lasix and losartan. BP stable today. If changes can add HCTZ back to regimen.   5. Encounter for smoking cessation counseling Discussed importance of quitting again and she desires to stop especially with her health. Reading material additionally given.    Alveta Heimlich PA-C  [facility] 09/13/2015 7:44 PM

## 2015-09-13 NOTE — Patient Instructions (Signed)
Smoking Cessation, Tips for Success If you are ready to quit smoking, congratulations! You have chosen to help yourself be healthier. Cigarettes bring nicotine, tar, carbon monoxide, and other irritants into your body. Your lungs, heart, and blood vessels will be able to work better without these poisons. There are many different ways to quit smoking. Nicotine gum, nicotine patches, a nicotine inhaler, or nicotine nasal spray can help with physical craving. Hypnosis, support groups, and medicines help break the habit of smoking. WHAT THINGS CAN I DO TO MAKE QUITTING EASIER?  Here are some tips to help you quit for good:  Pick a date when you will quit smoking completely. Tell all of your friends and family about your plan to quit on that date.  Do not try to slowly cut down on the number of cigarettes you are smoking. Pick a quit date and quit smoking completely starting on that day.  Throw away all cigarettes.   Clean and remove all ashtrays from your home, work, and car.  On a card, write down your reasons for quitting. Carry the card with you and read it when you get the urge to smoke.  Cleanse your body of nicotine. Drink enough water and fluids to keep your urine clear or pale yellow. Do this after quitting to flush the nicotine from your body.  Learn to predict your moods. Do not let a bad situation be your excuse to have a cigarette. Some situations in your life might tempt you into wanting a cigarette.  Never have "just one" cigarette. It leads to wanting another and another. Remind yourself of your decision to quit.  Change habits associated with smoking. If you smoked while driving or when feeling stressed, try other activities to replace smoking. Stand up when drinking your coffee. Brush your teeth after eating. Sit in a different chair when you read the paper. Avoid alcohol while trying to quit, and try to drink fewer caffeinated beverages. Alcohol and caffeine may urge you to  smoke.  Avoid foods and drinks that can trigger a desire to smoke, such as sugary or spicy foods and alcohol.  Ask people who smoke not to smoke around you.  Have something planned to do right after eating or having a cup of coffee. For example, plan to take a walk or exercise.  Try a relaxation exercise to calm you down and decrease your stress. Remember, you may be tense and nervous for the first 2 weeks after you quit, but this will pass.  Find new activities to keep your hands busy. Play with a pen, coin, or rubber band. Doodle or draw things on paper.  Brush your teeth right after eating. This will help cut down on the craving for the taste of tobacco after meals. You can also try mouthwash.   Use oral substitutes in place of cigarettes. Try using lemon drops, carrots, cinnamon sticks, or chewing gum. Keep them handy so they are available when you have the urge to smoke.  When you have the urge to smoke, try deep breathing.  Designate your home as a nonsmoking area.  If you are a heavy smoker, ask your health care provider about a prescription for nicotine chewing gum. It can ease your withdrawal from nicotine.  Reward yourself. Set aside the cigarette money you save and buy yourself something nice.  Look for support from others. Join a support group or smoking cessation program. Ask someone at home or at work to help you with your plan   to quit smoking.  Always ask yourself, "Do I need this cigarette or is this just a reflex?" Tell yourself, "Today, I choose not to smoke," or "I do not want to smoke." You are reminding yourself of your decision to quit.  Do not replace cigarette smoking with electronic cigarettes (commonly called e-cigarettes). The safety of e-cigarettes is unknown, and some may contain harmful chemicals.  If you relapse, do not give up! Plan ahead and think about what you will do the next time you get the urge to smoke. HOW WILL I FEEL WHEN I QUIT SMOKING? You  may have symptoms of withdrawal because your body is used to nicotine (the addictive substance in cigarettes). You may crave cigarettes, be irritable, feel very hungry, cough often, get headaches, or have difficulty concentrating. The withdrawal symptoms are only temporary. They are strongest when you first quit but will go away within 10-14 days. When withdrawal symptoms occur, stay in control. Think about your reasons for quitting. Remind yourself that these are signs that your body is healing and getting used to being without cigarettes. Remember that withdrawal symptoms are easier to treat than the major diseases that smoking can cause.  Even after the withdrawal is over, expect periodic urges to smoke. However, these cravings are generally short lived and will go away whether you smoke or not. Do not smoke! WHAT RESOURCES ARE AVAILABLE TO HELP ME QUIT SMOKING? Your health care provider can direct you to community resources or hospitals for support, which may include:  Group support.  Education.  Hypnosis.  Therapy.   This information is not intended to replace advice given to you by your health care provider. Make sure you discuss any questions you have with your health care provider.   Document Released: 08/15/2004 Document Revised: 12/08/2014 Document Reviewed: 05/05/2013 Elsevier Interactive Patient Education 2016 Elsevier Inc.  

## 2015-09-19 ENCOUNTER — Telehealth: Payer: Self-pay | Admitting: Internal Medicine

## 2015-09-19 NOTE — Telephone Encounter (Signed)
Atc, na, no vm.  Wcb.

## 2015-09-19 NOTE — Progress Notes (Signed)
  Medical screening examination/treatment/procedure(s) were performed by non-physician practitioner and as supervising physician I was immediately available for consultation/collaboration.     

## 2015-09-20 NOTE — Telephone Encounter (Signed)
lmtcb for pt.  

## 2015-09-21 NOTE — Telephone Encounter (Signed)
lmtcb

## 2015-09-24 NOTE — Telephone Encounter (Signed)
Spoke with pt, states that she needs to be scheduled for a procedure where "we look at her lungs" but didn't know the name of the procedure.  States this needs to be rescheduled from a few weeks back.  I see no cancelled or no-show procedures on pt's appt tab.  I advised that this can be discussed with MW on Thursday at her appt to see what is recommended for her.  Pt expressed understanding.   Nothing further needed.

## 2015-09-27 ENCOUNTER — Ambulatory Visit (INDEPENDENT_AMBULATORY_CARE_PROVIDER_SITE_OTHER): Payer: Managed Care, Other (non HMO) | Admitting: Internal Medicine

## 2015-09-27 ENCOUNTER — Ambulatory Visit (INDEPENDENT_AMBULATORY_CARE_PROVIDER_SITE_OTHER)
Admission: RE | Admit: 2015-09-27 | Discharge: 2015-09-27 | Disposition: A | Discharge status: Home / Self Care | Payer: Managed Care, Other (non HMO) | Source: Ambulatory Visit | Attending: Internal Medicine | Admitting: Internal Medicine

## 2015-09-27 ENCOUNTER — Encounter: Payer: Self-pay | Admitting: Internal Medicine

## 2015-09-27 VITALS — BP 102/70 | HR 95 | Temp 98.2°F | Ht 63.0 in | Wt 140.0 lb

## 2015-09-27 DIAGNOSIS — J438 Other emphysema: Secondary | ICD-10-CM | POA: Diagnosis not present

## 2015-09-27 DIAGNOSIS — J449 Chronic obstructive pulmonary disease, unspecified: Secondary | ICD-10-CM

## 2015-09-27 DIAGNOSIS — M5417 Radiculopathy, lumbosacral region: Secondary | ICD-10-CM

## 2015-09-27 DIAGNOSIS — F17210 Nicotine dependence, cigarettes, uncomplicated: Secondary | ICD-10-CM

## 2015-09-27 DIAGNOSIS — M5416 Radiculopathy, lumbar region: Secondary | ICD-10-CM

## 2015-09-27 DIAGNOSIS — I10 Essential (primary) hypertension: Secondary | ICD-10-CM | POA: Insufficient documentation

## 2015-09-27 DIAGNOSIS — J471 Bronchiectasis with (acute) exacerbation: Secondary | ICD-10-CM

## 2015-09-27 MED ORDER — HYDROCODONE-ACETAMINOPHEN 5-325 MG PO TABS: 1.0000 | ORAL_TABLET | ORAL | Status: DC | PRN

## 2015-09-27 MED ORDER — MOMETASONE FURO-FORMOTEROL FUM 100-5 MCG/ACT IN AERO: INHALATION_SPRAY | RESPIRATORY_TRACT | Status: DC

## 2015-09-27 MED ORDER — NEBIVOLOL HCL 10 MG PO TABS: 10.0000 mg | ORAL_TABLET | Freq: Every day | ORAL | Status: DC

## 2015-09-27 NOTE — Assessment & Plan Note (Addendum)
Very poorly controlled, associated with persistent infiltrates  in the areas with documented bronchiectasis     smoking not helping her mucociliary function or cough mechanics and would benefit from at least a flutter valve if not consideration for vest therapy if not improving p completes her levaquin then bronchoscopy should be considered to look for resistant or atypical organisms but she is in no shape for this present.

## 2015-09-27 NOTE — Assessment & Plan Note (Signed)
Strongly prefer in this setting: Bystolic, the most beta -1  selective Beta blocker available in sample form, with bisoprolol the most selective generic choice  on the market.   Will give bystolic samples 10 mg daily until returns for recheck.

## 2015-09-27 NOTE — Assessment & Plan Note (Addendum)
10/11/2014. PFT > FEV1 of 1.45 L, which was 64% of predicted, ratio of 72%, no significant bronchodilator response, FVC at 70%, air trapping noted. Diffusing capacity decreased at 67% 10/11/2014 ANORO rx > d/c 09/27/2015 due to coughing fits PFT's 06/06/2015  FEV1 1.51 (67 % ) ratio 69  p no % improvement from saba with DLCO  70 % corrects to 101 % for alv volume   - 09/27/2015  extensive coaching HFA effectiveness =    75% so try dulera 100 2bid     Her cough is more impressive than her clinical airflow obstruction but either way she her symptoms are very difficult to control. DDX of  difficult airways management all start with A and  include Adherence, Ace Inhibitors, Acid Reflux, Active Sinus Disease, Alpha 1 Antitripsin deficiency, Anxiety masquerading as Airways dz,  ABPA,  allergy(esp in young), Aspiration (esp in elderly), Adverse effects of meds,  Active smokers, A bunch of PE's (a small clot burden can't cause this syndrome unless there is already severe underlying pulm or vascular dz with poor reserve) plus two Bs  = Bronchiectasis and Beta blocker use..and one C= CHF  Adherence is always the initial "prime suspect" and is a multilayered concern that requires a "trust but verify" approach in every patient - starting with knowing how to use medications, especially inhalers, correctly, keeping up with refills and understanding the fundamental difference between maintenance and prns vs those medications only taken for a very short course and then stopped and not refilled.  - not keeping up with f/u appts  - The proper method of use, as well as anticipated side effects, of a metered-dose inhaler are discussed and demonstrated to the patient. Improved effectiveness after extensive coaching during this visit to a level of approximately  75% so try low dose hfa = dulera 100 2bid  Active smoking clearly the biggest concern here > see sep a/p  Adverse effects of dpi > try hfa instead   ? Acid (or  non-acid) GERD > always difficult to exclude as up to 75% of pts in some series report no assoc GI/ Heartburn symptoms> rec max (24h)  acid suppression and diet restrictions/ reviewed and instructions given in writing.  ? BB effect > see hbp  ? Bronchiectasis > see sep a/p  I had an extended discussion with the patient reviewing all relevant studies completed to date and  lasting 41mnutes of a 45 minute  Acute extended visit    Each maintenance medication was reviewed in detail including most importantly the difference between maintenance and prns and under what circumstances the prns are to be triggered using an action plan format that is not reflected in the computer generated alphabetically organized AVS.    Please see instructions for details which were reviewed in writing and the patient given a copy highlighting the part that I personally wrote and discussed at today's ov.

## 2015-09-27 NOTE — Progress Notes (Signed)
Quick Note:  Spoke with pt and notified of results per Dr. Wert. Pt verbalized understanding and denied any questions.  ______ 

## 2015-09-27 NOTE — Progress Notes (Signed)
Subjective:    Patient ID: Catherine Kelley, female    DOB: 18-Dec-1950, 64 y.o.   MRN: [id number]  HPI   OV 03/14/2015  Chief Complaint  Patient presents with  . CAP    Breathing is improved since last OV. Still on Augmentin.     OV 08/03/2014  Chief Complaint  Patient presents with  . Follow-up    HFU- Pt recently d/c from [facility] for CAP.    64 year old previously healthy female with a smoking history. She was admitted early to mid August 2015 with right lower lobe community acquired pneumonia with septic shock. Review of records suggest that she was not intubated but she is coughing up purulent sputum during the course of the stay. Extensive etiologic workup was negative for specific etiology. Says since that culture negative community-acquired right lower lobe pneumonia with necrotizing features. In sputum had mixed flora Patient herself tells me today that she had symptoms dating back to June 2015 and it failed multiple courses of antibiotics. Prior to this she was apparently healthy. She was discharged to a rehabilitation facility 07/14/2014 with advice to continue IV imipenem and linezolid for 9 days. However today along that she still with a PICC line and still on IV antibiotics. She has no sputum. She was using a walker but she is gaining strength and today is the first day without a walker. It is unclear what her discharge plan from the nursing home is. Otherwise she is feeling better according to her and her husband. Her appetite is slowly returning.  I do note that she has not had HIV or autoimmune workup  She feels she is ready to be dc'd out of SNF. Wants to know if she can stand and work in [employer] all day. Lookng at short term disabiliuty  09/06/2014 Follow up  Returns for a 1 month  follow up for Pneumonia  Pt has had a RLL cavitary PNA complicated by septic shock w/ hospitalization in 07/2014  She was discharged on prolonged IV abx w/ Primaxin, Zyvox x 21 days.  Seen last ov w/  d/c of abx . Labs showed autoimmune and HIV tests were neg. (ANA tr positive -not felt to be clinically significant )  Was discharged to rehab briefly and now back on home  Out of work since discharge, still feels weak .  Quit smoking after discharge , but admits she cheated. But is going to get nicotine patch to help her quit.  Discussed smoking cessation.  Reports breathing has been doing well since last ov, no new complaints.    10/05/14 Acute OV  Complains of sore throat, prod cough with "thick and gewy" off-white mucus x1 week.  On pred taper per PCP for lumbar back pain (10/29) also started on flexeril and vicodin .  She has been doing better until last week .  CXR today shows new right sided densities .  No fever , chest pain, orthopnea, edema or n/v.  Appetite is good .  No trouble swallowing.  Still smoking , advised on smoking cessation  >Levquin '750mg'$  x 7 d    10/11/14  Follow up PNA  Returns for follow up for PNA .  Was seen 1 week ago found to have PNA .  CXR showed new sided densities  Started on levaquin , took last dose today.  Still smoking , advised on smoking cessation  She denies any overt reflux or dysphagia Patient is feeling some better but has continued cough  with thick mucus She also is very fatigued Does not feel like she can go back to work . She denies any hemoptysis, chest pain, orthopnea, PND, or increased leg swelling  PFT today showed an FEV1 of 1.45 L, which was 64% of predicted, ratio of 72%, no significant bronchodilator response, FVC at 70%, air trapping noted. Diffusing capacity decreased at 67% >>CXR >improving RLL PNA   10/25/2014 Follow up PNA /COPD smoker  Patient returns for a two-week follow-up.  patient was admitted earlier this year in August for a cavitary right sided pneumonia with sepsis.  She was treated with aggressive IV antibiotics, and discharged on prolonged antibiotic course  Her chest x-ray continued to improve  With serial  follow-up.  3 weeks ago. Patient had increase cough and congestion. Was found to have new right-sided infiltrates consistent with pneumonia.  She was started on  Levaquin 750 mg for 7 days.  Chest x-ray 2 weeks ago showed improvement  in right-sided  consolidation  she returns today with improved symptoms. Reports breathing is improved but still having a lot of congestion esp at night tan/yellow/off-white mucus, some discomfort in the right rib area.  Feels the Anoro is working well for her  she continues to smoke but has cut back. We discussed smoking cessation  Appetite  And energy level is improving She denies any fever, hemoptysis, chest pain, orthopnea, or overt reflux nausea, vomiting, or diarrhea  chest x-ray today shows persistent right-sided  Densities with slight progression. >>CT chest    02/21/2015 Acute OV  Present for an acute office visit .  Complains of fever  prod cough (yellow, green) for 1 week then worse for 3 days .  Denies sob, wheezing, or chest discomfort, hemoptyiss , edema or rash.  Working at [employer] .  Last CT chest 02/07/15 showed severe redsidual bronchiectasis RLL with severe plugging Wants some cough syrup to help rest , cough keeping her up at night.     OV 03/14/2015  Chief Complaint  Patient presents with  . CAP    Breathing is improved since last OV. Still on Augmentin.    Follow-up right lower lobe necrotizing pneumonia from summer 2015. She now  has residual right lower lobe bronchiectasis with severe mucus plugging and recurrent exacerbations as evidence on CT scan of the chest March 2016. She most recently a week ago saw my colleague Dr. Danton Sewer for an exacerbation. Given Augmentin for 2 weeks. She's finished one week of Augmentin and is feeling better. She reports that even at baseline she has chronic sputum production that is discolored. She is unable to quantify the amount but she says it is significant. Current Augmentin therapy has helped  her. Overall she is frustrated by her quality of life and the chronic cough unexpected in production and repeated exacerbations.   Dg Chest 2 View  03/14/2015   CLINICAL DATA:  Cough, shortness of breath, followup pneumonia  EXAM: CHEST  2 VIEW  COMPARISON:  Chest x-ray of 02/21/2015 and CT chest of 02/06/2005  FINDINGS: There is little change in opacity within the right lower lobe most consistent with pneumonia. There is again noted to be 8 and focus of cavitation within the right lower low which was demonstrated on prior CT. No new parenchymal infiltrate is seen. No pleural effusion is noted. Mediastinal and hilar contours are unremarkable. The heart is within normal limits in size. No bony abnormality is seen.  IMPRESSION: Little change in probable pneumonia in the right  lower lobe with an area of cavitation as demonstrated by prior CT. No new abnormality.   Electronically Signed   By: Ivar Drape M.D.   On: 03/14/2015 16:26      OV 06/06/2015 Ramaswamy ov  Chief Complaint  Patient presents with  . Follow-up    PFT done today. c/o prod cough with yellow occ green mucus. Denies any wheezing, cp or tightness.  Followup for    RLL pneumonia summer 2015 with residual bronchiectasis March 2016:   - Currently no infectious symptoims but CXR May 2016 still showed only slow resolution with residual infiltrates   Smoking - new remission  :  reports that she quit smoking 11 days ago. Her smoking use included Cigarettes. She has a 42 pack-year smoking history. She has never used smokeless tobacco.   COPD :   doing Spiriva alone. Symptom burden ok she says but on CAT Score it is 30 and high. PFT 06/06/2015 = gold stage 2 copd - so symmptms are out out proportion. Does not want to incrase MDI. OF symptoms cough appears main issue for her subjectively. Moderate ins everity. Mostly at night esp when she lies down. Not on GERD rx. On atrovent spray but not using it A/p #Right lower Lobe necrotizing CAP  Pneumonia - Aug 2015  - slow radiological resoution as of CT chest dec 2015 but with residual bronchiectasis march 2016 but unresolved pneumonia on CT march 2016 and CXR may 2016 - do repeat CT chest wo contrast  In 3 weeks for followup; consider surgery for bronchiectasis if persists and focal  #Chronic cough   - given fact apppears more at night it might be independent of copd but copd/bronchiectasis could be playing a role  - START generic fluticasone inhaler 2 squirts each nostril daily - Continue atroven nasal spray - START OTC Prilosec '20mg'$  daily AM on empty stomach - IF no response work on bronchiectasis issues  #COPD - moderate   - confirmed on PFT 06/06/2015 that lung function 66% of normal  - for now continue spiriva  1 puff daily - if symptoms do not improve then will step up treatment   #Followup CT chest in 3 weeks Followup for cough response and CT findings with my  NP in 3 weeks>  Did not return as rec   Admit date: 08/16/2015 Discharge date: 08/21/2015  Recommendations for Outpatient Follow-up:  1. Follow-up with pulmonary in 3-4 weeks for repeat chest x-ray will probably need a BAL. 2. Follow-up with primary care doctor in 2-4 weeks check blood pressure and titrate antihypertensive medications as tolerated. Her hydrochlorothiazide was DC'd issue continue on losartan and Lasix.  Discharge Diagnoses:  Severe sepsis  Benign hypertensive heart disease without heart failure  CAP (community acquired pneumonia)  Acute respiratory failure with hypoxia  Hyponatremia  COPD, moderate  Bronchiectasis, non-tuberculous  Rhabdomyolysis  Severe sepsis without septic shock     09/27/2015 acute extended ov/Wert re: cough/ copd/bronchiectasis/ resumed smoking  Chief Complaint  Patient presents with  . Acute Visit    Pt hospitalized for PNA 08/16/15-08/21/15. She states not doing better since then- c/o "aches all over", prod cough with blood tinged sputum, fever and  chills.    No flutter, on nadolol still smoking  Still finishing up levaquin but should have completed 10 days on 10/24 if taking them correctly/ no n or v  No obvious patterns in day to day or daytime variability or assoc   cp or chest tightness, subjective  wheeze or overt sinus or hb symptoms. No unusual exp hx or h/o childhood pna/ asthma or knowledge of premature birth.    Also denies any obvious fluctuation of symptoms with weather or environmental changes or other aggravating or alleviating factors except as outlined above   Current Medications, Allergies, Complete Past Medical History, Past Surgical History, Family History, and Social History were reviewed in [employer] record.  ROS  The following are not active complaints unless bolded sore throat, dysphagia, dental problems, itching, sneezing,  nasal congestion or excess/ purulent secretions, ear ache,   fever, chills, sweats, unintended wt loss, classically pleuritic or exertional cp, hemoptysis,  orthopnea pnd or leg swelling, presyncope, palpitations, abdominal pain, anorexia, nausea, vomiting, diarrhea  or change in bowel or bladder habits, change in stools or urine, dysuria,hematuria,  rash, arthralgias, visual complaints, headache, numbness, weakness or ataxia or problems with walking or coordination,  change in mood/affect or memory.                   Objective:   Physical Exam   amb chronically ill wf with harsh congested sounding cough  Wt Readings from Last 3 Encounters:  09/27/15 140 lb (63.504 kg)  09/13/15 136 lb (61.689 kg)  08/16/15 144 lb 13.5 oz (65.7 kg)    Vital signs reviewed   HEENT: nl dentition, turbinates, and orophanx. Nl external ear canals without cough reflex   NECK :  without JVD/Nodes/TM/ nl carotid upstrokes bilaterally   LUNGS: no acc muscle use, clear to A and P bilaterally without cough on insp or exp maneuvers   CV:  RRR  no s3 or murmur or increase in P2, no  edema   ABD:  soft and nontender with nl excursion in the supine position. No bruits or organomegaly, bowel sounds nl  MS:  warm without deformities, calf tenderness, cyanosis or clubbing  SKIN: warm and dry without lesions    NEURO:  alert, approp, no deficits    CXR PA and Lateral:   09/27/2015 :    I personally reviewed images and agree with radiology impression as follows:    Persistent right middle and lower lobe infiltrates.     Assessment & Plan:

## 2015-09-27 NOTE — Patient Instructions (Addendum)
The key is to stop smoking completely before smoking completely stops you - we cannot promise to help you if you can't stop - we can try but we can't promise  For cough > mucinex   1200 mg every 12 hours and use the flutter valve  And supplement if can't stop coughing with  Hydrocodone  every 4 hours as needed  dulera 100 Take 2 puffs first thing in am and then another 2 puffs about 12 hours later.   If short of breath > nebulizer 2.5 mg every 4 hours as needed  Stop anoro and corgard / nadolol   Start bystolic 10 mg once  daily   Try prilosec otc '20mg'$   Take 30-60 min before first meal of the day and Pepcid ac (famotidine) 20 mg one @  bedtime until cough is completely gone for at least a week without the need for cough suppression  GERD (REFLUX)  is an extremely common cause of respiratory symptoms just like yours , many times with no obvious heartburn at all.    It can be treated with medication, but also with lifestyle changes including elevation of the head of your bed (ideally with 6 inch  bed blocks),  Smoking cessation, avoidance of late meals, excessive alcohol, and avoid fatty foods, chocolate, peppermint, colas, red wine, and acidic juices such as orange juice.  NO MINT OR MENTHOL PRODUCTS SO NO COUGH DROPS  USE SUGARLESS CANDY INSTEAD (Jolley ranchers or Stover's or Life Savers) or even ice chips will also do - the key is to swallow to prevent all throat clearing. NO OIL BASED VITAMINS - use powdered substitutes.    Please see patient coordinator before you leave today  to schedule new nebulizer    Please remember to go to the   x-ray department downstairs for your tests - we will call you with the results when they are available.  See Tammy NP w/in 2 weeks with all your medications, even over the counter meds, separated in two separate bags, the ones you take no matter what vs the ones you stop once you feel better and take only as needed when you feel you need them.    (not a  med calendar visit)

## 2015-09-27 NOTE — Assessment & Plan Note (Signed)
>   3 min discussion  I emphasized point blank to her  that although we never turn away smokers from the pulmonary clinic, we do ask that they understand that the recommendations that we make  won't work nearly as well in the presence of continued cigarette exposure.  In fact, we may very well  reach a point where we can't promise to help the patient if he/she can't quit smoking. (We can and will promise to try to help, we just can't promise what we recommend will really work)

## 2015-09-29 ENCOUNTER — Telehealth: Payer: Self-pay | Admitting: Family Medicine

## 2015-09-29 ENCOUNTER — Ambulatory Visit (INDEPENDENT_AMBULATORY_CARE_PROVIDER_SITE_OTHER): Payer: Managed Care, Other (non HMO) | Admitting: Family Medicine

## 2015-09-29 VITALS — BP 110/68 | HR 93 | Temp 98.3°F | Resp 18 | Ht 63.0 in | Wt 137.0 lb

## 2015-09-29 DIAGNOSIS — R059 Cough, unspecified: Secondary | ICD-10-CM

## 2015-09-29 DIAGNOSIS — J189 Pneumonia, unspecified organism: Secondary | ICD-10-CM

## 2015-09-29 DIAGNOSIS — R42 Dizziness and giddiness: Secondary | ICD-10-CM | POA: Diagnosis not present

## 2015-09-29 DIAGNOSIS — R05 Cough: Secondary | ICD-10-CM | POA: Diagnosis not present

## 2015-09-29 DIAGNOSIS — T8130XA Disruption of wound, unspecified, initial encounter: Secondary | ICD-10-CM

## 2015-09-29 DIAGNOSIS — J449 Chronic obstructive pulmonary disease, unspecified: Secondary | ICD-10-CM | POA: Diagnosis not present

## 2015-09-29 DIAGNOSIS — I872 Venous insufficiency (chronic) (peripheral): Secondary | ICD-10-CM | POA: Diagnosis not present

## 2015-09-29 DIAGNOSIS — L03119 Cellulitis of unspecified part of limb: Secondary | ICD-10-CM | POA: Diagnosis not present

## 2015-09-29 LAB — POCT CBC
Granulocyte percent: 71.2 % (ref 37–80)
HCT, POC: 37.2 % — AB (ref 37.7–47.9)
Hemoglobin: 12.1 g/dL — AB (ref 12.2–16.2)
Lymph, poc: 2.9 (ref 0.6–3.4)
MCH, POC: 27.9 pg (ref 27–31.2)
MCHC: 32.4 g/dL (ref 31.8–35.4)
MCV: 86.1 fL (ref 80–97)
MID (cbc): 0.8 (ref 0–0.9)
MPV: 6.8 fL (ref 0–99.8)
POC Granulocyte: 9.3 — AB (ref 2–6.9)
POC LYMPH PERCENT: 22.4 %L (ref 10–50)
POC MID %: 6.4 %M (ref 0–12)
Platelet Count, POC: 400 10*3/uL (ref 142–424)
RBC: 4.32 M/uL (ref 4.04–5.48)
RDW, POC: 14.7 %
WBC: 13 10*3/uL — AB (ref 4.6–10.2)

## 2015-09-29 LAB — COMPLETE METABOLIC PANEL WITH GFR
AST: 21 U/L (ref 10–35)
Albumin: 3 g/dL — ABNORMAL LOW (ref 3.6–5.1)
Alkaline Phosphatase: 126 U/L (ref 33–130)
BUN: 10 mg/dL (ref 7–25)
Calcium: 8.8 mg/dL (ref 8.6–10.4)
Chloride: 87 mmol/L — ABNORMAL LOW (ref 98–110)
GFR, Est Non African American: >89 mL/min (ref >=60)
Potassium: 3.4 mmol/L — ABNORMAL LOW (ref 3.5–5.3)

## 2015-09-29 LAB — POCT URINALYSIS DIP (MANUAL ENTRY)
Bilirubin, UA: NEGATIVE
Blood, UA: NEGATIVE
Glucose, UA: NEGATIVE
Ketones, POC UA: NEGATIVE
Leukocytes, UA: NEGATIVE
Nitrite, UA: NEGATIVE
Protein Ur, POC: NEGATIVE
Spec Grav, UA: <=1.005
Urobilinogen, UA: 0.2
pH, UA: 5.5

## 2015-09-29 LAB — COMPLETE METABOLIC PANEL WITHOUT GFR
ALT: 12 U/L (ref 6–29)
CO2: 31 mmol/L (ref 20–31)
Creat: 0.51 mg/dL (ref 0.50–0.99)
GFR, Est African American: >89 mL/min (ref >=60)
Glucose, Bld: 88 mg/dL (ref 65–99)
Sodium: 125 mmol/L — ABNORMAL LOW (ref 135–146)
Total Bilirubin: 0.4 mg/dL (ref 0.2–1.2)
Total Protein: 7.3 g/dL (ref 6.1–8.1)

## 2015-09-29 LAB — POC MICROSCOPIC URINALYSIS (UMFC): Mucus: ABSENT

## 2015-09-29 MED ORDER — AMOXICILLIN-POT CLAVULANATE 875-125 MG PO TABS: 1.0000 | ORAL_TABLET | Freq: Two times a day (BID) | ORAL | Status: DC

## 2015-09-29 MED ORDER — CEFTRIAXONE SODIUM 1 G IJ SOLR
1.0000 g | Freq: Once | INTRAMUSCULAR | Status: AC
  Administered 2015-09-29: 1 g via INTRAMUSCULAR

## 2015-09-29 NOTE — Patient Instructions (Signed)
Community-Acquired Pneumonia, Adult Pneumonia is an infection of the lungs. There are different types of pneumonia. One type can develop while a person is in a hospital. A different type, called community-acquired pneumonia, develops in people who are not, or have not recently been, in the hospital or other health care facility.  CAUSES Pneumonia may be caused by bacteria, viruses, or funguses. Community-acquired pneumonia is often caused by Streptococcus pneumonia bacteria. These bacteria are often passed from one person to another by breathing in droplets from the cough or sneeze of an infected person. RISK FACTORS The condition is more likely to develop in:  People who havechronic diseases, such as chronic obstructive pulmonary disease (COPD), asthma, congestive heart failure, cystic fibrosis, diabetes, or kidney disease.  People who haveearly-stage or late-stage HIV.  People who havesickle cell disease.  People who havehad their spleen removed (splenectomy).  People who havepoor Human resources officer.  People who havemedical conditions that increase the risk of breathing in (aspirating) secretions their own mouth and nose.   People who havea weakened immune system (immunocompromised).  People who smoke.  People whotravel to areas where pneumonia-causing germs commonly exist.  People whoare around animal habitats or animals that have pneumonia-causing germs, including birds, bats, rabbits, cats, and farm animals. SYMPTOMS Symptoms of this condition include:  Adry cough.  A wet (productive) cough.  Fever.  Sweating.  Chest pain, especially when breathing deeply or coughing.  Rapid breathing or difficulty breathing.  Shortness of breath.  Shaking chills.  Fatigue.  Muscle aches. DIAGNOSIS Your health care provider will take a medical history and perform a physical exam. You may also have other tests, including:  Imaging studies of your chest, including  X-rays.  Tests to check your blood oxygen level and other blood gases.  Other tests on blood, mucus (sputum), fluid around your lungs (pleural fluid), and urine. If your pneumonia is severe, other tests may be done to identify the specific cause of your illness. TREATMENT The type of treatment that you receive depends on many factors, such as the cause of your pneumonia, the medicines you take, and other medical conditions that you have. For most adults, treatment and recovery from pneumonia may occur at home. In some cases, treatment must happen in a hospital. Treatment may include:  Antibiotic medicines, if the pneumonia was caused by bacteria.  Antiviral medicines, if the pneumonia was caused by a virus.  Medicines that are given by mouth or through an IV tube.  Oxygen.  Respiratory therapy. Although rare, treating severe pneumonia may include:  Mechanical ventilation. This is done if you are not breathing well on your own and you cannot maintain a safe blood oxygen level.  Thoracentesis. This procedureremoves fluid around one lung or both lungs to help you breathe better. HOME CARE INSTRUCTIONS  Take over-the-counter and prescription medicines only as told by your health care provider.  Only takecough medicine if you are losing sleep. Understand that cough medicine can prevent your body's natural ability to remove mucus from your lungs.  If you were prescribed an antibiotic medicine, take it as told by your health care provider. Do not stop taking the antibiotic even if you start to feel better.  Sleep in a semi-upright position at night. Try sleeping in a reclining chair, or place a few pillows under your head.  Do not use tobacco products, including cigarettes, chewing tobacco, and e-cigarettes. If you need help quitting, ask your health care provider.  Drink enough water to keep your urine  clear or pale yellow. This will help to thin out mucus secretions in your  lungs. PREVENTION There are ways that you can decrease your risk of developing community-acquired pneumonia. Consider getting a pneumococcal vaccine if:  You are older than 65 years of age.  You are older than 65 years of age and are undergoing cancer treatment, have chronic lung disease, or have other medical conditions that affect your immune system. Ask your health care provider if this applies to you. There are different types and schedules of pneumococcal vaccines. Ask your health care provider which vaccination option is best for you. You may also prevent community-acquired pneumonia if you take these actions:  Get an influenza vaccine every year. Ask your health care provider which type of influenza vaccine is best for you.  Go to the dentist on a regular basis.  Wash your hands often. Use hand sanitizer if soap and water are not available. SEEK MEDICAL CARE IF:  You have a fever.  You are losing sleep because you cannot control your cough with cough medicine. SEEK IMMEDIATE MEDICAL CARE IF:  You have worsening shortness of breath.  You have increased chest pain.  Your sickness becomes worse, especially if you are an older adult or have a weakened immune system.  You cough up blood.   This information is not intended to replace advice given to you by your health care provider. Make sure you discuss any questions you have with your health care provider.   Document Released: 11/17/2005 Document Revised: 08/08/2015 Document Reviewed: 03/14/2015 Elsevier Interactive Patient Education 2016 Else[employer] Cellulitis Cellulitis is an infection of the skin and the tissue beneath it. The infected area is usually red and tender. Cellulitis occurs most often in the arms and lower legs.  CAUSES  Cellulitis is caused by bacteria that enter the skin through cracks or cuts in the skin. The most common types of bacteria that cause cellulitis are staphylococci and streptococci. SIGNS AND  SYMPTOMS   Redness and warmth.  Swelling.  Tenderness or pain.  Fever. DIAGNOSIS  Your health care provider can usually determine what is wrong based on a physical exam. Blood tests may also be done. TREATMENT  Treatment usually involves taking an antibiotic medicine. HOME CARE INSTRUCTIONS   Take your antibiotic medicine as directed by your health care provider. Finish the antibiotic even if you start to feel better.  Keep the infected arm or leg elevated to reduce swelling.  Apply a warm cloth to the affected area up to 4 times per day to relieve pain.  Take medicines only as directed by your health care provider.  Keep all follow-up visits as directed by your health care provider. SEEK MEDICAL CARE IF:   You notice red streaks coming from the infected area.  Your red area gets larger or turns dark in color.  Your bone or joint underneath the infected area becomes painful after the skin has healed.  Your infection returns in the same area or another area.  You notice a swollen bump in the infected area.  You develop new symptoms.  You have a fever. SEEK IMMEDIATE MEDICAL CARE IF:   You feel very sleepy.  You develop vomiting or diarrhea.  You have a general ill feeling (malaise) with muscle aches and pains.   This information is not intended to replace advice given to you by your health care provider. Make sure you discuss any questions you have with your health care provider.   Document  Released: 08/27/2005 Document Revised: 08/08/2015 Document Reviewed: 02/02/2012 Elsevier Interactive Patient Education [employer].

## 2015-09-29 NOTE — Progress Notes (Signed)
Chief Complaint:  Chief Complaint  Patient presents with  . Dizziness    Going on "for a while."  . Chills    "Off and on" since diagnosed with pneumonia  . Leg Swelling    Bilateral    HPI: Catherine Kelley is a 64 y.o. female who reports to [facility] today complaining of acute on chronic dizziness,acute on chronic cellulitis on legs x 3 days and also cough and CAP in the hospital from 9/13-9/20. She was dc with amoxacillin and also subsequently given Levaquin after not feeling improved 09/13/15. She felt better but then her legs, she has hx of cellulitis, started hurting bilaterally and she noticed warmth, pain, and swelling bilaterally. She started having chills and felt worse. Her dizziness is chronic and she went to see the pulmonologist and was told to change her blood pressure med from losartan to bystolic  but she wants to talk to her cardiologist first. She is ok with the dizziness since it does not last long. She denies any n/vdysuira, back pain, or hematuria or diarrhea. She continues to smoke, she states she is complaint with her COPD meds, she denies wheezing, She is worried about her lungs, chills, and cough and also PNA that has not completely resolved, and also her cellulitis. She has been to see Dr Melvyn Novas, there was talk at some pint about a BAL and possible lobectomy but she is not well enough for that. She is very anxious and states that her doctor's appt are dependent on her husband being able to drive her. Please see prior OV notes from Dr Melvyn Novas, our office and ED discahrge.   BP Readings from Last 3 Encounters:  09/29/15 110/68  09/27/15 102/70  09/13/15 128/80   SpO2 Readings from Last 3 Encounters:  09/29/15 93%  09/27/15 97%  09/13/15 95%   Wt Readings from Last 3 Encounters:  09/29/15 137 lb (62.143 kg)  09/27/15 140 lb (63.504 kg)  09/13/15 136 lb (61.689 kg)     She had a recent xray at Dr [employer] office 2 days ago:  CLINICAL DATA: Followup pneumonia,  cough  EXAM: CHEST - 2 VIEW  COMPARISON: 09/13/2015  FINDINGS: Cardiac shadow is stable. Persistent right basilar infiltrate is noted in the lower and middle lobes. Minimal improved aeration is noted. No new focal infiltrate is seen. The left lung remains clear.  IMPRESSION: Persistent right middle and lower lobe infiltrates.   Electronically Signed  By: Inez Catalina M.D.  On: 09/27/2015 12:01  Past Medical History  Diagnosis Date  . Hypertension     essential hypertension  . Tobacco abuse     smoking about 1/2 pk of cigarettes a day  . Anxiety   . Depression     psychiatrist Dr. Toy Care  . Respiratory infection   . Shortness of breath   . Malaise   . Lightheadedness   . Emphysematous COPD ([place])     changes in right base along with patchy areas on last x-ray  . COPD (chronic obstructive pulmonary disease) ([place])   . History of echocardiogram 12/24/2006    Est. EF of 26-94% NORMAL LV SYSTOLIC FUNCTION WITH IMPAIRED RELAXATION -- MILD AORTIC SCLEROSIS -- NORMAL PALONARY ARTERY PRESSURE -- NO OLD ECHOS FOR COMPARISON -- [name], MD  . History of cardiovascular stress test 08/15/2004    EF of 70% -- Normal stress cardiolite.  There is no evidence of ischemia and there is normal LV function. -- [name].  MD  . Diastolic dysfunction   . Allergy   . Asthma   . Heart murmur   . Pneumonia     "several times since May 2015" (07/06/2014)  . Chronic bronchitis ([place])     "get it alot; maybe not q yr" (07/06/2014)  . GERD (gastroesophageal reflux disease)    Past Surgical History  Procedure Laterality Date  . Tubal ligation  1986   Social History   Social History  . Marital Status: Married    Spouse Name: N/A  . Number of Children: N/A  . Years of Education: N/A   Occupational History  . SALES ASSOCIATE     [employer]   Social History Main Topics  . Smoking status: Current Some Day Smoker -- 1.00 packs/day for 42 years    Types: Cigarettes  .  Smokeless tobacco: Never Used  . Alcohol Use: No  . Drug Use: No  . Sexual Activity: No   Other Topics Concern  . None   Social History Narrative   Patient currently lives with her husband. She works in Scientist, research (medical). They do have a dog. No bird or hot tub exposure. No mold exposure. No recent travel.   Family History  Problem Relation Age of Onset  . Stroke Mother   . Heart disease Father   . Hyperlipidemia Father   . Hypertension Father   . Breast cancer Maternal Aunt   . Asthma Maternal Grandmother    Allergies  Allergen Reactions  . Ceclor [Cefaclor] Hives  . Escitalopram Oxalate     Pt does not recall ever taking medication  . Sertraline Hcl     UNKNOWN  . Sulfa Drugs Cross Reactors Hives and Rash    Hives and rash   Prior to Admission medications   Medication Sig Start Date End Date Taking? Authorizing Provider  ALPRAZolam Duanne Moron) 0.5 MG tablet Take 0.5 mg by mouth at bedtime.    Yes Historical Provider, MD  Calcium Carbonate-Vitamin D (CALCIUM-VITAMIN D) 500-200 MG-UNIT per tablet Take 1 tablet by mouth daily.   Yes Historical Provider, MD  divalproex (DEPAKOTE ER) 500 MG 24 hr tablet Take 500 mg by mouth at bedtime.  02/22/12  Yes Historical Provider, MD  fluticasone (FLONASE) 50 MCG/ACT nasal spray Place 2 sprays into both nostrils daily. 06/06/15  Yes Brand Males, MD  furosemide (LASIX) 20 MG tablet TAKE 2 TABLETS BY MOUTH EVERY DAY 05/25/15  Yes [name], MD  HYDROcodone-acetaminophen (NORCO/VICODIN) 5-325 MG tablet Take 1 tablet by mouth every 4 (four) hours as needed for moderate pain (or cough). 09/27/15  Yes Tanda Rockers, MD  losartan (COZAAR) 100 MG tablet Take 1 tablet (100 mg total) by mouth daily. 08/21/15  Yes Charlynne Cousins, MD  mometasone-formoterol [facility]) 100-5 MCG/ACT AERO Take 2 puffs first thing in am and then another 2 puffs about 12 hours later. 09/27/15  Yes Tanda Rockers, MD  Multiple Vitamin (MULTIVITAMIN) tablet Take 1 tablet by mouth  daily.   Yes Historical Provider, MD  nebivolol (BYSTOLIC) 10 MG tablet Take 1 tablet (10 mg total) by mouth daily. 09/27/15  Yes Tanda Rockers, MD  nortriptyline (PAMELOR) 50 MG capsule Take 100 mg by mouth at bedtime.   Yes Historical Provider, MD  tiZANidine (ZANAFLEX) 4 MG tablet Take 1 tablet (4 mg total) by mouth every 6 (six) hours as needed for muscle spasms. 09/13/15  Yes Tishira R Brewington, PA-C  albuterol (PROVENTIL) (2.5 MG/3ML) 0.083% nebulizer solution Take 3 mLs (2.5  mg total) by nebulization every 3 (three) hours as needed for wheezing or shortness of breath. Patient not taking: Reported on 09/27/2015 03/14/15   Brand Males, MD  saccharomyces boulardii (FLORASTOR) 250 MG capsule Take 1 capsule (250 mg total) by mouth 2 (two) times daily. Patient not taking: Reported on 09/13/2015 08/21/15   Charlynne Cousins, MD     ROS: The patient denies night sweats, unintentional weight loss, chest pain, palpitations, wheezing, dyspnea on exertion, nausea, vomiting, abdominal pain, dysuria, hematuria, melen  All other systems have been reviewed and were otherwise negative with the exception of those mentioned in the HPI and as above.    PHYSICAL EXAM: Filed Vitals:   09/29/15 1227  BP: 110/68  Pulse: 93  Temp: 98.3 F (36.8 C)  Resp: 18   Body mass index is 24.27 kg/(m^2).   General: Alert, anxious HEENT:  Normocephalic, atraumatic, oropharynx patent. EOMI, PERRLA Cardiovascular:  Regular rate and rhythm, no rubs murmurs or gallops.  No Carotid bruits, radial pulse intact. No pedal edema.  Respiratory: Clear to auscultation bilaterally.  No wheezes, rales, or rhonchi.  No cyanosis, no use of accessory musculature Abdominal: No organomegaly, abdomen is soft and non-tender, positive bowel sounds. No masses. Skin: + acute on chronic bilateral lower leg cellulitis, venous stasis, neg for Homans Neurologic: Facial musculature symmetric. Psychiatric: Patient acts appropriately  throughout our interaction. Lymphatic: No cervical or submandibular lymphadenopathy Musculoskeletal: Gait intact. No edema, tenderness   LABS: Lab Results  Component Value Date   WBC 13.0* 09/29/2015   HGB 12.1* 09/29/2015   HCT 37.2* 09/29/2015   MCV 86.1 09/29/2015   PLT 203 08/17/2015     EKG/XRAY:   Primary read interpreted by Dr. Marin Comment at [facility].   ASSESSMENT/PLAN: Encounter Diagnoses  Name Primary?  . CAP (community acquired pneumonia)   . Cellulitis of lower extremity, unspecified laterality Yes  . Dizziness and giddiness   . Disruption of wound, initial encounter   . COPD, moderate ([place])   . Cough   . Venous insufficiency (chronic) (peripheral)    Rx Augmentin for cellulitis, we discuss risks and benefits of being on prolong abx use and possible C diff with it, precautions given.  Patient received a dose of rocephin. She has had this before without issues Repeat Spo2 95% Fu prn , she was told to elevate legs, use compression, return in 3 days or sooner prn  Labs pending since non compliant with meds , has not been taking K and also other meds, cont to smoke  Gross sideeffects, risk and benefits, and alternatives of medications d/w patient. Patient is aware that all medications have potential sideeffects and we are unable to predict every sideeffect or drug-drug interaction that may occur.  Thao Le DO  10/01/2015 8:23 AM  10/01/15--LM for patient about labs, how she is doing, she is uspposed to return for recheck.

## 2015-09-29 NOTE — Telephone Encounter (Signed)
Patient called answering service--recently treated with pneumonia.  Followed up with pulmonology who did not recommend further antibiotics.  Night sweats overnight.  Now legs are red and swollen and patient is concerned about recurrent cellulitis.  Also has hand/wrist injury.  A/P: possible fever with recurrent cellulitis:  Recommend office visit.  Patient took hydrocodone at 6:00am and scared to drive. Advised to wait until 12:00noon to present to office to allow hydrocodone sedation to resolve.  Patient agreeable.  Husband will not drive patient to the office this morning.

## 2015-09-30 ENCOUNTER — Other Ambulatory Visit: Payer: Self-pay | Admitting: Family Medicine

## 2015-10-02 NOTE — Telephone Encounter (Signed)
Does patient need to RTC?

## 2015-10-03 ENCOUNTER — Ambulatory Visit (INDEPENDENT_AMBULATORY_CARE_PROVIDER_SITE_OTHER): Payer: Managed Care, Other (non HMO) | Admitting: Family Medicine

## 2015-10-03 VITALS — BP 118/78 | HR 62 | Temp 98.1°F | Resp 16 | Ht 63.0 in | Wt 136.0 lb

## 2015-10-03 DIAGNOSIS — J479 Bronchiectasis, uncomplicated: Secondary | ICD-10-CM

## 2015-10-03 DIAGNOSIS — J449 Chronic obstructive pulmonary disease, unspecified: Secondary | ICD-10-CM | POA: Diagnosis not present

## 2015-10-03 DIAGNOSIS — R42 Dizziness and giddiness: Secondary | ICD-10-CM

## 2015-10-03 DIAGNOSIS — J189 Pneumonia, unspecified organism: Secondary | ICD-10-CM

## 2015-10-03 DIAGNOSIS — R296 Repeated falls: Secondary | ICD-10-CM

## 2015-10-03 DIAGNOSIS — L03115 Cellulitis of right lower limb: Secondary | ICD-10-CM

## 2015-10-03 DIAGNOSIS — I872 Venous insufficiency (chronic) (peripheral): Secondary | ICD-10-CM

## 2015-10-03 DIAGNOSIS — E871 Hypo-osmolality and hyponatremia: Secondary | ICD-10-CM

## 2015-10-03 DIAGNOSIS — I10 Essential (primary) hypertension: Secondary | ICD-10-CM

## 2015-10-03 DIAGNOSIS — L03116 Cellulitis of left lower limb: Secondary | ICD-10-CM | POA: Diagnosis not present

## 2015-10-03 DIAGNOSIS — S61501D Unspecified open wound of right wrist, subsequent encounter: Secondary | ICD-10-CM | POA: Diagnosis not present

## 2015-10-03 LAB — POCT CBC
GRANULOCYTE PERCENT: 64.5 % (ref 37–80)
HEMATOCRIT: 36.2 % — AB (ref 37.7–47.9)
HEMOGLOBIN: 12.1 g/dL — AB (ref 12.2–16.2)
Lymph, poc: 2.7 (ref 0.6–3.4)
MCH: 28.5 pg (ref 27–31.2)
MCHC: 33.4 g/dL (ref 31.8–35.4)
MCV: 85.4 fL (ref 80–97)
MID (cbc): 0.3 (ref 0–0.9)
MPV: 6.7 fL (ref 0–99.8)
POC GRANULOCYTE: 5.4 (ref 2–6.9)
POC LYMPH PERCENT: 32.3 %L (ref 10–50)
POC MID %: 3.2 %M (ref 0–12)
Platelet Count, POC: 353 10*3/uL (ref 142–424)
RBC: 4.24 M/uL (ref 4.04–5.48)
RDW, POC: 14.4 %
WBC: 8.4 10*3/uL (ref 4.6–10.2)

## 2015-10-03 NOTE — Progress Notes (Signed)
Subjective:    Patient ID: Catherine Kelley, female    DOB: Apr 12, 1951, 64 y.o.   MRN: [id number]  10/03/2015  recheck potassium was low; recheck on sodium; and Cough   HPI This 64 y.o. female presents for 72 hour follow-up:  1.  Bronchiectasis with exacerbation/Community acquired pneumonia:  Dr. Marin Comment prescribed Augmentin at visit 72 hours ago.   S/p pulmonology evaluation by [facility] on 09/27/15.   Was having chills/sweats last night; low grade fevers only 99.5.   2. Anxiety: daughter is pregnant; due in May.  Will also be having another grandchild in May as well.  Attended birthday party on Saturday.    3. Cellulitis:  Dr. Marin Comment prescribed Augmentin for patient five days ago.  Legs are still swollen and red; not wearing compression stockings during the day; trying to elevate legs. Takes Lasix '20mg'$  two daily per Brackbill.   4.  Hyponatremia:  Sodium 125 on 09/29/15.  Presenting for repeat today.  Taking Lasix daily; drinks a lot of water.  5. Fall: fell five times last week due to dizziness.  Blood pressure has been very low; diastolic Bp in 25K during admission.  Held Losartan and decreased Nadolol at hospital discharge but had to restart some medication due to elevated diastolic readings.  6.  HTN: taking Losartan '100mg'$  1/2 daily. Has been taking 1/2 Nadolol.    7.  Dizziness: a little better today; fell five times since getting out of hospital.   8.  R wrist wound: looking better; applying betadine. No longer draining.       Review of Systems  Constitutional: Negative for fever, chills, diaphoresis and fatigue.  HENT: Positive for congestion and rhinorrhea. Negative for ear pain and sore throat.   Eyes: Negative for visual disturbance.  Respiratory: Positive for cough. Negative for shortness of breath and wheezing.   Cardiovascular: Positive for leg swelling. Negative for chest pain and palpitations.  Gastrointestinal: Negative for nausea, vomiting, abdominal pain, diarrhea and  constipation.  Endocrine: Negative for cold intolerance, heat intolerance, polydipsia, polyphagia and polyuria.  Skin: Positive for color change and wound. Negative for pallor and rash.  Neurological: Negative for dizziness, tremors, seizures, syncope, facial asymmetry, speech difficulty, weakness, light-headedness, numbness and headaches.    Past Medical History  Diagnosis Date  . Hypertension     essential hypertension  . Tobacco abuse     smoking about 1/2 pk of cigarettes a day  . Anxiety   . Depression     psychiatrist Dr. Toy Care  . Emphysematous COPD ([place])     changes in right base along with patchy areas on last x-ray  . COPD (chronic obstructive pulmonary disease) ([place])   . History of echocardiogram 12/24/2006    Est. EF of 27-06% NORMAL LV SYSTOLIC FUNCTION WITH IMPAIRED RELAXATION -- MILD AORTIC SCLEROSIS -- NORMAL PALONARY ARTERY PRESSURE -- NO OLD ECHOS FOR COMPARISON -- [name], MD  . History of cardiovascular stress test 08/15/2004    EF of 70% -- Normal stress cardiolite.  There is no evidence of ischemia and there is normal LV function. -- [name]. MD  . Diastolic dysfunction   . Allergy   . Asthma   . Heart murmur   . Pneumonia     "several times since May 2015" (07/06/2014)  . Chronic bronchitis ([place])     "get it alot; maybe not q yr" (07/06/2014)  . GERD (gastroesophageal reflux disease)    Past Surgical History  Procedure Laterality Date  .  Tubal ligation  1986   Allergies  Allergen Reactions  . Ceclor [Cefaclor] Hives  . Escitalopram Oxalate     Pt does not recall ever taking medication  . Sertraline Hcl     UNKNOWN  . Sulfa Drugs Cross Reactors Hives and Rash    Hives and rash   Current Outpatient Prescriptions  Medication Sig Dispense Refill  . ALPRAZolam (XANAX) 0.5 MG tablet Take 0.5 mg by mouth at bedtime.     [name] amoxicillin-clavulanate (AUGMENTIN) 875-125 MG tablet Take 1 tablet by mouth 2 (two) times daily. 20 tablet 0  .  benzonatate (TESSALON) 100 MG capsule TAKE 1 TO 2 CAPSULES BY MOUTH 3 TIMES DAILY AS NEEDED FOR COUGH 60 capsule 2  . Calcium Carbonate-Vitamin D (CALCIUM-VITAMIN D) 500-200 MG-UNIT per tablet Take 1 tablet by mouth daily.    . divalproex (DEPAKOTE ER) 500 MG 24 hr tablet Take 500 mg by mouth at bedtime.     . fluticasone (FLONASE) 50 MCG/ACT nasal spray Place 2 sprays into both nostrils daily. 16 g 2  . furosemide (LASIX) 20 MG tablet TAKE 2 TABLETS BY MOUTH EVERY DAY 180 tablet 1  . HYDROcodone-acetaminophen (NORCO/VICODIN) 5-325 MG tablet Take 1 tablet by mouth every 4 (four) hours as needed for moderate pain (or cough). 60 tablet 0  . losartan (COZAAR) 100 MG tablet Take 1 tablet (100 mg total) by mouth daily. 30 tablet 3  . Multiple Vitamin (MULTIVITAMIN) tablet Take 1 tablet by mouth daily.    . nebivolol (BYSTOLIC) 10 MG tablet Take 1 tablet (10 mg total) by mouth daily.    . nortriptyline (PAMELOR) 50 MG capsule Take 100 mg by mouth at bedtime.    [name] tiZANidine (ZANAFLEX) 4 MG tablet Take 1 tablet (4 mg total) by mouth every 6 (six) hours as needed for muscle spasms. 40 tablet 0  . albuterol (PROVENTIL) (2.5 MG/3ML) 0.083% nebulizer solution Take 3 mLs (2.5 mg total) by nebulization every 3 (three) hours as needed for wheezing or shortness of breath. (Patient not taking: Reported on 10/03/2015) 75 mL 12  . mometasone-formoterol (DULERA) 100-5 MCG/ACT AERO Take 2 puffs first thing in am and then another 2 puffs about 12 hours later. (Patient not taking: Reported on 10/03/2015)    . saccharomyces boulardii (FLORASTOR) 250 MG capsule Take 1 capsule (250 mg total) by mouth 2 (two) times daily. (Patient not taking: Reported on 10/03/2015) 30 capsule 0   No current facility-administered medications for this visit.   Social History   Social History  . Marital Status: Married    Spouse Name: N/A  . Number of Children: N/A  . Years of Education: N/A   Occupational History  . SALES ASSOCIATE      [employer]   Social History Main Topics  . Smoking status: Current Some Day Smoker -- 1.00 packs/day for 42 years    Types: Cigarettes  . Smokeless tobacco: Never Used  . Alcohol Use: No  . Drug Use: No  . Sexual Activity: No   Other Topics Concern  . Not on file   Social History Narrative   Patient currently lives with her husband. She works in [employer] research (medical). They do have a dog. No bird or hot tub exposure. No mold exposure. No recent travel.   Family History  Problem Relation Age of Onset  . Stroke Mother   . Heart disease Father   . Hyperlipidemia Father   . Hypertension Father   . Breast cancer Maternal Aunt   .  Asthma Maternal Grandmother        Objective:    BP 118/78 mmHg  Pulse 62  Temp(Src) 98.1 F (36.7 C) (Oral)  Resp 16  Ht '5\' 3"'$  (1.6 m)  Wt 136 lb (61.689 kg)  BMI 24.10 kg/m2  SpO2 92% Physical Exam  Constitutional: She is oriented to person, place, and time. She appears well-developed and well-nourished. No distress.  HENT:  Head: Normocephalic and atraumatic.  Right Ear: External ear normal.  Left Ear: External ear normal.  Nose: Nose normal.  Mouth/Throat: Oropharynx is clear and moist.  Eyes: Conjunctivae and EOM are normal. Pupils are equal, round, and reactive to light.  Neck: Normal range of motion. Neck supple. Carotid bruit is not present. No thyromegaly present.  Cardiovascular: Normal rate, regular rhythm, normal heart sounds and intact distal pulses.  Exam reveals no gallop and no friction rub.   No murmur heard. 2+ pitting edema to B proximal calves.  Pulmonary/Chest: Effort normal. No accessory muscle usage. No respiratory distress. She has no wheezes. She has no rhonchi. She has rales in the right lower field.  Abdominal: Soft. Bowel sounds are normal. She exhibits no distension and no mass. There is no tenderness. There is no rebound and no guarding.  Musculoskeletal: She exhibits edema.  Gait is slowed.  Lymphadenopathy:    She has no  cervical adenopathy.  Neurological: She is alert and oriented to person, place, and time. No cranial nerve deficit.  Skin: Skin is warm and dry. No rash noted. She is not diaphoretic. There is erythema. No pallor.  +erythema medial anterior lower legs B.  No fluctuance. R wrist:  +healing wound with eschar and dry without erythema.  Scattered bruises B proximal arms large 8cm x 10cm.   Psychiatric: She has a normal mood and affect. Her behavior is normal.   Results for orders placed or performed in visit on 10/03/15  POCT CBC  Result Value Ref Range   WBC 8.4 4.6 - 10.2 K/uL   Lymph, poc 2.7 0.6 - 3.4   POC LYMPH PERCENT 32.3 10 - 50 %L   MID (cbc) 0.3 0 - 0.9   POC MID % 3.2 0 - 12 %M   POC Granulocyte 5.4 2 - 6.9   Granulocyte percent 64.5 37 - 80 %G   RBC 4.24 4.04 - 5.48 M/uL   Hemoglobin 12.1 (A) 12.2 - 16.2 g/dL   HCT, POC 36.2 (A) 37.7 - 47.9 %   MCV 85.4 80 - 97 fL   MCH, POC 28.5 27 - 31.2 pg   MCHC 33.4 31.8 - 35.4 g/dL   RDW, POC 14.4 %   Platelet Count, POC 353 142 - 424 K/uL   MPV 6.7 0 - 99.8 fL       Assessment & Plan:   1. Bronchiectasis, non-tuberculous ([place])   2. CAP (community acquired pneumonia)   3. Hyponatremia   4. Venous insufficiency (chronic) (peripheral)   5. Essential hypertension   6. COPD GOLD II/ still smoking   7. Cellulitis of both lower extremities   8. Open wound of right wrist, subsequent encounter   9. Dizziness   10. Frequent falls     1. Bronchiectasis/CAP: persistent; improving WBC count; complete Augmentin as prescribed; follow-up with pulmonology next week. 2.  Hyponatremia: worsening; repeat today; consider decreased Lasix dose if persists and avoid excessive water intake. 3.  Venous insufficiency chronic: persistent; non-compliant with compression stockings and elevation; continue Lasix but consider decrease dose due  to hyponatremia. 4.  HTN: stable; improved diastolic readings; no current hypotension. 5.  Cellulitis B lower  extremities: mild at this time; continue Augmentin; recommend elevation of feet and compression stockings. 6. Wound R wrist: improving; no active drainage. 7. Dizziness; improving today; causing frequent falls.  Consider decrease in Lasix dose. 8. Frequent falls: worsening; has suffered with five falls in past week; multiple bruises on arms and legs due to falls. Pt declined referral for PT due to financial restrictions.   Orders Placed This Encounter  Procedures  . Comprehensive metabolic panel  . POCT CBC   No orders of the defined types were placed in this encounter.    No Follow-up on file.   Cantrell Larouche Elayne Guerin, M.D. [facility] [address] [place], [place]  [address] [phone] phone [phone] fax

## 2015-10-04 ENCOUNTER — Telehealth: Payer: Self-pay | Admitting: Family Medicine

## 2015-10-04 ENCOUNTER — Telehealth: Payer: Self-pay

## 2015-10-04 LAB — COMPREHENSIVE METABOLIC PANEL
ALBUMIN: 2.9 g/dL — AB (ref 3.6–5.1)
ALK PHOS: 138 U/L — AB (ref 33–130)
ALT: 15 U/L (ref 6–29)
AST: 21 U/L (ref 10–35)
BUN: 7 mg/dL (ref 7–25)
CHLORIDE: 92 mmol/L — AB (ref 98–110)
CO2: 30 mmol/L (ref 20–31)
CREATININE: 0.6 mg/dL (ref 0.50–0.99)
Calcium: 8.5 mg/dL — ABNORMAL LOW (ref 8.6–10.4)
Glucose, Bld: 125 mg/dL — ABNORMAL HIGH (ref 65–99)
POTASSIUM: 3.6 mmol/L (ref 3.5–5.3)
SODIUM: 130 mmol/L — AB (ref 135–146)
TOTAL PROTEIN: 7.2 g/dL (ref 6.1–8.1)
Total Bilirubin: 0.3 mg/dL (ref 0.2–1.2)

## 2015-10-04 NOTE — Telephone Encounter (Signed)
Done all FMLA forms

## 2015-10-04 NOTE — Telephone Encounter (Signed)
[employer] has faxed an Attending Physician Statement to be completed by Dr. Marin Comment. Patient was seen on 09/29/2015 and it seems as though the patient will be out of work until 10/24/2015. Please complete and return to [place] upon completion. Placed in your box on 10/04/2015.   Thanks!

## 2015-10-04 NOTE — Telephone Encounter (Signed)
Patient brought in FMLA forms to be filled out by Dr. Marin Comment I have completed what I can from the [facility] notes and highlighted the areas that need to be finished. Please fill out, sign and return to the FMLA box at [address] check out desk  Within 5-7 business days. I will be placing it in your box on 10/04/15.

## 2015-10-05 NOTE — Telephone Encounter (Signed)
Forms received back from Dr. Marin Comment. They have been scanned and faxed back to patient's employer.

## 2015-10-10 ENCOUNTER — Encounter: Payer: Self-pay | Admitting: Adult Health

## 2015-10-10 ENCOUNTER — Telehealth: Payer: Self-pay | Admitting: Family Medicine

## 2015-10-10 ENCOUNTER — Ambulatory Visit (INDEPENDENT_AMBULATORY_CARE_PROVIDER_SITE_OTHER): Payer: Managed Care, Other (non HMO) | Admitting: Adult Health

## 2015-10-10 VITALS — BP 112/76 | HR 82 | Temp 97.5°F | Ht 63.0 in | Wt 136.0 lb

## 2015-10-10 DIAGNOSIS — J449 Chronic obstructive pulmonary disease, unspecified: Secondary | ICD-10-CM | POA: Diagnosis not present

## 2015-10-10 DIAGNOSIS — J471 Bronchiectasis with (acute) exacerbation: Secondary | ICD-10-CM

## 2015-10-10 DIAGNOSIS — Z23 Encounter for immunization: Secondary | ICD-10-CM

## 2015-10-10 NOTE — Telephone Encounter (Signed)
Patient states that there is a discrepancy in the dates listed on her FMLA paperwork. She states that her employer is now requiring a letter that states that she is supposed to be out of work from 09/23/2015 until 10/25/2015. Please advise. If patient needs to RTC , I will call to let her know.

## 2015-10-10 NOTE — Assessment & Plan Note (Signed)
Recurrent flare with ongoing smoking and right middle lobe and right lower involvement  Persistent area on recent cxr and CT chest  If cxr is not improving consider FOB /BAL on return   Plan  continue on Dulera 2 puffs Twice daily   Use mucinex Twice daily  As needed  Congestion  Use Flutter valve several times a day.  Work on not smoking  Follow up Dr. Chase Caller in 4 weeks with chest xray .  Please contact office for sooner follow up if symptoms do not improve or worsen or seek emergency care  Flu shot today

## 2015-10-10 NOTE — Assessment & Plan Note (Signed)
Recurrent flare with ongoing smoking   Plan  ontinue on Dulera 2 puffs Twice daily   Use mucinex Twice daily  As needed  Congestion  Use Flutter valve several times a day.  Work on not smoking  Follow up Dr. Chase Caller in 4 weeks with chest xray .  Please contact office for sooner follow up if symptoms do not improve or worsen or seek emergency care  Flu shot today

## 2015-10-10 NOTE — Progress Notes (Signed)
Subjective:    Patient ID: Catherine Kelley, female    DOB: June 26, 1951, 64 y.o.   MRN: [id number]  HPI   OV 03/14/2015  Chief Complaint  Patient presents with  . CAP    Breathing is improved since last OV. Still on Augmentin.     OV 08/03/2014  Chief Complaint  Patient presents with  . Follow-up    HFU- Pt recently d/c from [facility] for CAP.    64 year old previously healthy female with a smoking history. She was admitted early to mid August 2015 with right lower lobe community acquired pneumonia with septic shock. Review of records suggest that she was not intubated but she is coughing up purulent sputum during the course of the stay. Extensive etiologic workup was negative for specific etiology. Says since that culture negative community-acquired right lower lobe pneumonia with necrotizing features. In sputum had mixed flora Patient herself tells me today that she had symptoms dating back to June 2015 and it failed multiple courses of antibiotics. Prior to this she was apparently healthy. She was discharged to a rehabilitation facility 07/14/2014 with advice to continue IV imipenem and linezolid for 9 days. However today along that she still with a PICC line and still on IV antibiotics. She has no sputum. She was using a walker but she is gaining strength and today is the first day without a walker. It is unclear what her discharge plan from the nursing home is. Otherwise she is feeling better according to her and her husband. Her appetite is slowly returning.  I do note that she has not had HIV or autoimmune workup  She feels she is ready to be dc'd out of SNF. Wants to know if she can stand and work in [employer] all day. Lookng at short term disabiliuty  09/06/2014 Follow up  Returns for a 1 month  follow up for Pneumonia  Pt has had a RLL cavitary PNA complicated by septic shock w/ hospitalization in 07/2014  She was discharged on prolonged IV abx w/ Primaxin, Zyvox x 21 days.  Seen last ov w/  d/c of abx . Labs showed autoimmune and HIV tests were neg. (ANA tr positive -not felt to be clinically significant )  Was discharged to rehab briefly and now back on home  Out of work since discharge, still feels weak .  Quit smoking after discharge , but admits she cheated. But is going to get nicotine patch to help her quit.  Discussed smoking cessation.  Reports breathing has been doing well since last ov, no new complaints.    10/05/14 Acute OV  Complains of sore throat, prod cough with "thick and gewy" off-white mucus x1 week.  On pred taper per PCP for lumbar back pain (10/29) also started on flexeril and vicodin .  She has been doing better until last week .  CXR today shows new right sided densities .  No fever , chest pain, orthopnea, edema or n/v.  Appetite is good .  No trouble swallowing.  Still smoking , advised on smoking cessation  >Levquin '750mg'$  x 7 d    10/11/14  Follow up PNA  Returns for follow up for PNA .  Was seen 1 week ago found to have PNA .  CXR showed new sided densities  Started on levaquin , took last dose today.  Still smoking , advised on smoking cessation  She denies any overt reflux or dysphagia Patient is feeling some better but has continued cough  with thick mucus She also is very fatigued Does not feel like she can go back to work . She denies any hemoptysis, chest pain, orthopnea, PND, or increased leg swelling  PFT today showed an FEV1 of 1.45 L, which was 64% of predicted, ratio of 72%, no significant bronchodilator response, FVC at 70%, air trapping noted. Diffusing capacity decreased at 67% >>CXR >improving RLL PNA   10/25/2014 Follow up PNA /COPD smoker  Patient returns for a two-week follow-up.  patient was admitted earlier this year in August for a cavitary right sided pneumonia with sepsis.  She was treated with aggressive IV antibiotics, and discharged on prolonged antibiotic course  Her chest x-ray continued to improve  With serial  follow-up.  3 weeks ago. Patient had increase cough and congestion. Was found to have new right-sided infiltrates consistent with pneumonia.  She was started on  Levaquin 750 mg for 7 days.  Chest x-ray 2 weeks ago showed improvement  in right-sided  consolidation  she returns today with improved symptoms. Reports breathing is improved but still having a lot of congestion esp at night tan/yellow/off-white mucus, some discomfort in the right rib area.  Feels the Anoro is working well for her  she continues to smoke but has cut back. We discussed smoking cessation  Appetite  And energy level is improving She denies any fever, hemoptysis, chest pain, orthopnea, or overt reflux nausea, vomiting, or diarrhea  chest x-ray today shows persistent right-sided  Densities with slight progression. >>CT chest    02/21/2015 Acute OV  Present for an acute office visit .  Complains of fever  prod cough (yellow, green) for 1 week then worse for 3 days .  Denies sob, wheezing, or chest discomfort, hemoptyiss , edema or rash.  Working at [employer] .  Last CT chest 02/07/15 showed severe redsidual bronchiectasis RLL with severe plugging Wants some cough syrup to help rest , cough keeping her up at night.     OV 03/14/2015  Chief Complaint  Patient presents with  . CAP    Breathing is improved since last OV. Still on Augmentin.    Follow-up right lower lobe necrotizing pneumonia from summer 2015. She now  has residual right lower lobe bronchiectasis with severe mucus plugging and recurrent exacerbations as evidence on CT scan of the chest March 2016. She most recently a week ago saw my colleague Dr. Danton Sewer for an exacerbation. Given Augmentin for 2 weeks. She's finished one week of Augmentin and is feeling better. She reports that even at baseline she has chronic sputum production that is discolored. She is unable to quantify the amount but she says it is significant. Current Augmentin therapy has helped  her. Overall she is frustrated by her quality of life and the chronic cough unexpected in production and repeated exacerbations.   Dg Chest 2 View  03/14/2015   CLINICAL DATA:  Cough, shortness of breath, followup pneumonia  EXAM: CHEST  2 VIEW  COMPARISON:  Chest x-ray of 02/21/2015 and CT chest of 02/06/2005  FINDINGS: There is little change in opacity within the right lower lobe most consistent with pneumonia. There is again noted to be 8 and focus of cavitation within the right lower low which was demonstrated on prior CT. No new parenchymal infiltrate is seen. No pleural effusion is noted. Mediastinal and hilar contours are unremarkable. The heart is within normal limits in size. No bony abnormality is seen.  IMPRESSION: Little change in probable pneumonia in the right  lower lobe with an area of cavitation as demonstrated by prior CT. No new abnormality.   Electronically Signed   By: Ivar Drape M.D.   On: 03/14/2015 16:26      OV 06/06/2015 Ramaswamy ov  Chief Complaint  Patient presents with  . Follow-up    PFT done today. c/o prod cough with yellow occ green mucus. Denies any wheezing, cp or tightness.  Followup for    RLL pneumonia summer 2015 with residual bronchiectasis March 2016:   - Currently no infectious symptoims but CXR May 2016 still showed only slow resolution with residual infiltrates   Smoking - new remission  :  reports that she quit smoking 11 days ago. Her smoking use included Cigarettes. She has a 42 pack-year smoking history. She has never used smokeless tobacco.   COPD :   doing Spiriva alone. Symptom burden ok she says but on CAT Score it is 30 and high. PFT 06/06/2015 = gold stage 2 copd - so symmptms are out out proportion. Does not want to incrase MDI. OF symptoms cough appears main issue for her subjectively. Moderate ins everity. Mostly at night esp when she lies down. Not on GERD rx. On atrovent spray but not using it A/p #Right lower Lobe necrotizing CAP  Pneumonia - Aug 2015  - slow radiological resoution as of CT chest dec 2015 but with residual bronchiectasis march 2016 but unresolved pneumonia on CT march 2016 and CXR may 2016 - do repeat CT chest wo contrast  In 3 weeks for followup; consider surgery for bronchiectasis if persists and focal  #Chronic cough   - given fact apppears more at night it might be independent of copd but copd/bronchiectasis could be playing a role  - START generic fluticasone inhaler 2 squirts each nostril daily - Continue atroven nasal spray - START OTC Prilosec '20mg'$  daily AM on empty stomach - IF no response work on bronchiectasis issues  #COPD - moderate   - confirmed on PFT 06/06/2015 that lung function 66% of normal  - for now continue spiriva  1 puff daily - if symptoms do not improve then will step up treatment   #Followup CT chest in 3 weeks Followup for cough response and CT findings with my  NP in 3 weeks>  Did not return as rec   Admit date: 08/16/2015 Discharge date: 08/21/2015  Recommendations for Outpatient Follow-up:  1. Follow-up with pulmonary in 3-4 weeks for repeat chest x-ray will probably need a BAL. 2. Follow-up with primary care doctor in 2-4 weeks check blood pressure and titrate antihypertensive medications as tolerated. Her hydrochlorothiazide was DC'd issue continue on losartan and Lasix.  Discharge Diagnoses:  Severe sepsis  Benign hypertensive heart disease without heart failure  CAP (community acquired pneumonia)  Acute respiratory failure with hypoxia  Hyponatremia  COPD, moderate  Bronchiectasis, non-tuberculous  Rhabdomyolysis  Severe sepsis without septic shock     09/27/2015 acute extended ov/Wert re: cough/ copd/bronchiectasis/ resumed smoking  Chief Complaint  Patient presents with  . Acute Visit    Pt hospitalized for PNA 08/16/15-08/21/15. She states not doing better since then- c/o "aches all over", prod cough with blood tinged sputum, fever and  chills.    No flutter, on nadolol still smoking  Still finishing up levaquin but should have completed 10 days on 10/24 if taking them correctly/ no n or v >>>  10/10/2015 Follow up : Bronchiectasis , smoker  Pt returns for follow up for recent bronchiectasis flare .  Pt c/o prod cough with yellow mucus and chest congestion.  She is some better but still has cough and congestion .  Has finished course of Levaquin and Augmentin.  Still smoking , discussed cessation.  Remains on Dulera . Unable to afford ANORO in past.  Denies any   fever, nausea or vomiting, hemoptysis , chest pain .  No using her flutter valve much  CXR 2 weeks ago with persistent infiltrate along RML and RLL .     Current Medications, Allergies, Complete Past Medical History, Past Surgical History, Family History, and Social History were reviewed in [employer] record.  ROS  The following are not active complaints unless bolded sore throat, dysphagia, dental problems, itching, sneezing,  nasal congestion or excess/ purulent secretions, ear ache,   sweats, unintended wt loss, classically pleuritic or exertional cp, hemoptysis,  orthopnea pnd or leg swelling, presyncope, palpitations, abdominal pain, anorexia, nausea, vomiting, diarrhea  or change in bowel or bladder habits, change in stools or urine, dysuria,hematuria,  rash, arthralgias, visual complaints, headache, numbness, weakness or ataxia or problems with walking or coordination,  change in mood/affect or memory.                   Objective:   Physical Exam   amb chronically ill wf  Vital signs reviewed   HEENT: nl dentition, turbinates, and orophanx. Nl external ear canals without cough reflex   NECK :  without JVD/Nodes/TM/ nl carotid upstrokes bilaterally   LUNGS: few scattered rhonchi    CV:  RRR  no s3 or murmur or increase in P2, no edema   ABD:  soft and nontender with nl excursion in the supine position. No bruits or  organomegaly, bowel sounds nl  MS:  warm without deformities, calf tenderness, cyanosis or clubbing  SKIN: warm and dry without lesions    NEURO:  alert, approp, no deficits    CXR PA and Lateral:   09/27/2015 :      Persistent right middle and lower lobe infiltrates.     Assessment & Plan:

## 2015-10-10 NOTE — Patient Instructions (Addendum)
Continue on Dulera 2 puffs Twice daily   Use mucinex Twice daily  As needed  Congestion  Use Flutter valve several times a day.  Work on not smoking  Follow up Dr. Chase Caller in 4 weeks with chest xray .  Please contact office for sooner follow up if symptoms do not improve or worsen or seek emergency care  Flu shot today

## 2015-10-11 NOTE — Telephone Encounter (Signed)
Please look in letters section, i have corrected this. You can print it out and fax it to wherever it needs to go.

## 2015-10-11 NOTE — Telephone Encounter (Signed)
Done

## 2015-10-12 ENCOUNTER — Ambulatory Visit: Payer: Self-pay | Admitting: Family Medicine

## 2015-10-15 ENCOUNTER — Encounter: Payer: Self-pay | Admitting: Family Medicine

## 2015-10-15 NOTE — Telephone Encounter (Signed)
Patient called regarding letter for work because the dates are still incorrect. She called previously to request and stated it needed to state the dates 09/23/2015-10/25/2015. That phone message was addressed and was corrected, however the dates on the letter dated 10/11/2015 were 09/23/2015-10/15/2015. I reviewed FMLA paper work and did not see those specific dates listed. She needs this faxed to [facility] ASAP. Please advise [facility] Fax# [phone]  Please call and notify patient once this has been faxed

## 2015-10-15 NOTE — Telephone Encounter (Signed)
Please correct the dates of the letter to reflect 10/23-11/24/16. Fax it to [facility] nad then notify patient , thanks

## 2015-10-16 NOTE — Telephone Encounter (Signed)
Done

## 2015-10-17 ENCOUNTER — Ambulatory Visit (INDEPENDENT_AMBULATORY_CARE_PROVIDER_SITE_OTHER): Payer: Managed Care, Other (non HMO) | Admitting: Cardiology

## 2015-10-17 ENCOUNTER — Encounter: Payer: Self-pay | Admitting: Cardiology

## 2015-10-17 VITALS — BP 110/60 | HR 94 | Ht 63.0 in | Wt 130.8 lb

## 2015-10-17 DIAGNOSIS — I119 Hypertensive heart disease without heart failure: Secondary | ICD-10-CM | POA: Diagnosis not present

## 2015-10-17 NOTE — Patient Instructions (Addendum)
Medication Instructions:  Your physician recommends that you continue on your current medications as directed. Please refer to the Current Medication list given to you today.  Labwork: NONE  Testing/Procedures: NONE  Follow-Up: Your physician wants you to follow-up in: [place] will receive a reminder letter in the mail two months in advance. If you don't receive a letter, please call our office to schedule the follow-up appointment.  YOU NEED TO QUIT SMOKING  If you need a refill on your cardiac medications before your next appointment, please call your pharmacy.

## 2015-10-17 NOTE — Progress Notes (Signed)
Cardiology Office Note   Date:  10/17/2015   ID:  Catherine Kelley, DOB September 07, 1951, MRN [id number]  PCP:  [name], MD  Cardiologist: [name] MD  Chief Complaint  Patient presents with  . Hypertension      History of Present Illness: Catherine Kelley is a 64 y.o. female who presents for scheduled follow-up visit  . She has a history of essential hypertension. She also has a history of frequent episodes of bronchitis and continues to smoke against advice.She has now been diagnosed with cavitary pneumonia and bronchiectasis. She is followed closely by pulmonary. He has a chronic cough.Since we last saw her she was rehospitalized September 15 until 08/21/2015 with pneumonia again.  Her pulmonologist is Dr. Chase Caller.  The patient continues to smoke a half pack of cigarettes daily against advice. She states that in August 2015 she was hospitalized at [facility] with severe necrotizing pneumonia and sepsis. Following acute care treatment she had to go to a rehabilitation facility for 3 weeks before returning home. 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 The patient once again is out on medical leave until 10/25/2015 She has a past history of depression. Interestingly, she states that she smokes less when she is depressed than when she is not depressed. He denies any chest pain or shortness of breath. In the hospital she was given Bystolic but has not taken it because her blood pressure has been low normal since discharge. Past Medical History  Diagnosis Date  . Hypertension     essential hypertension  . Tobacco abuse     smoking about 1/2 pk of cigarettes a day  . Anxiety   . Depression     psychiatrist Dr. Toy Care  . Emphysematous COPD ([place])     changes in right base along with patchy areas on last x-ray  . COPD (chronic obstructive pulmonary disease) ([place])   . History of echocardiogram 12/24/2006    Est.  EF of 39-76% NORMAL LV SYSTOLIC FUNCTION WITH IMPAIRED RELAXATION -- MILD AORTIC SCLEROSIS -- NORMAL PALONARY ARTERY PRESSURE -- NO OLD ECHOS FOR COMPARISON -- [name], MD  . History of cardiovascular stress test 08/15/2004    EF of 70% -- Normal stress cardiolite.  There is no evidence of ischemia and there is normal LV function. -- Marcello Moores A. Elliana Bal. MD  . Diastolic dysfunction   . Allergy   . Asthma   . Heart murmur   . Pneumonia     "several times since May 2015" (07/06/2014)  . Chronic bronchitis ([place])     "get it alot; maybe not q yr" (07/06/2014)  . GERD (gastroesophageal reflux disease)     Past Surgical History  Procedure Laterality Date  . Tubal ligation  1986     Current Outpatient Prescriptions  Medication Sig Dispense Refill  . ALPRAZolam (XANAX) 0.5 MG tablet Take 0.5 mg by mouth at bedtime.     [name] amoxicillin-clavulanate (AUGMENTIN) 875-125 MG tablet Take 1 tablet by mouth daily.    . benzonatate (TESSALON) 100 MG capsule TAKE 1 TO 2 CAPSULES BY MOUTH 3 TIMES DAILY AS NEEDED FOR COUGH 60 capsule 2  . Calcium Carbonate-Vitamin D (CALCIUM-VITAMIN D) 500-200 MG-UNIT per tablet Take 1 tablet by mouth daily.    . divalproex (DEPAKOTE ER) 500 MG 24 hr tablet Take 500 mg by mouth at bedtime.     . fluticasone (FLONASE) 50 MCG/ACT  nasal spray Place 2 sprays into both nostrils daily. 16 g 2  . furosemide (LASIX) 20 MG tablet TAKE 2 TABLETS BY MOUTH EVERY DAY 180 tablet 1  . HYDROcodone-acetaminophen (NORCO/VICODIN) 5-325 MG tablet Take 1 tablet by mouth every 4 (four) hours as needed for moderate pain (or cough). 60 tablet 0  . ipratropium (ATROVENT) 0.03 % nasal spray Place 2 sprays into both nostrils 2 (two) times daily.  11  . losartan-hydrochlorothiazide (HYZAAR) 50-12.5 MG tablet Take 0.5 tablets by mouth daily.    . mometasone-formoterol (DULERA) 100-5 MCG/ACT AERO Take 2 puffs first thing in am and then another 2 puffs about 12 hours later.    . Multiple Vitamin  (MULTIVITAMIN) tablet Take 1 tablet by mouth daily.    . nadolol (CORGARD) 40 MG tablet Take 20 mg by mouth daily.     . nortriptyline (PAMELOR) 50 MG capsule Take 100 mg by mouth at bedtime.    . saccharomyces boulardii (FLORASTOR) 250 MG capsule Take 1 capsule (250 mg total) by mouth 2 (two) times daily. 30 capsule 0  . tiZANidine (ZANAFLEX) 4 MG tablet Take 1 tablet (4 mg total) by mouth every 6 (six) hours as needed for muscle spasms. 40 tablet 0   No current facility-administered medications for this visit.    Allergies:   Ceclor; Escitalopram oxalate; Sertraline hcl; and Sulfa drugs cross reactors    Social History:  The patient  reports that she has been smoking Cigarettes.  She has a 42 pack-year smoking history. She has never used smokeless tobacco. She reports that she does not drink alcohol or use illicit drugs.   Family History:  The patient's family history includes Asthma in her maternal grandmother; Breast cancer in her maternal aunt; Heart disease in her father; Hyperlipidemia in her father; Hypertension in her father; Stroke in her mother.    ROS:  Please see the history of present illness.   Otherwise, review of systems are positive for none.   All other systems are reviewed and negative.    PHYSICAL EXAM: VS:  BP 110/60 mmHg  Pulse 94  Ht '5\' 3"'$  (1.6 m)  Wt 130 lb 12.8 oz (59.33 kg)  BMI 23.18 kg/m2 , BMI Body mass index is 23.18 kg/(m^2). GEN: Well nourished, well developed, in no acute distress HEENT: normal Neck: no JVD, carotid bruits, or masses Cardiac: RRR; no murmurs, rubs, or gallops,no edema  Respiratory:  clear to auscultation bilaterally, normal work of breathing.  Decreased breath sounds at right base. GI: soft, nontender, nondistended, + BS MS: no deformity or atrophy Skin: warm and dry, no rash Neuro:  Strength and sensation are intact Psych: euthymic mood, full affect   EKG:  EKG is ordered today. The ekg ordered today demonstrates normal sinus  rhythm and no ischemic changes heart rate is 94 bpm.   Recent Labs: 08/16/2015: Magnesium 1.6* 08/17/2015: Platelets 203 10/03/2015: ALT 15; BUN 7; Creat 0.60; Hemoglobin 12.1*; Potassium 3.6; Sodium 130*    Lipid Panel No results found for: CHOL, TRIG, HDL, CHOLHDL, VLDL, LDLCALC, LDLDIRECT    Wt Readings from Last 3 Encounters:  10/17/15 130 lb 12.8 oz (59.33 kg)  10/10/15 136 lb (61.689 kg)  10/03/15 136 lb (61.689 kg)         ASSESSMENT AND PLAN:  1. Hypertensive heart disease without heart failure 2. Depression 3. tobacco abuse , ongoing 4. Frequent bronchitis. Past history of necrotizing pneumonitis. History of bronchiectasis and cavitary pneumonia.   Current medicines are reviewed  at length with the patient today.  The patient does not have concerns regarding medicines.  The following changes have been made:  no change  Labs/ tests ordered today include:   Orders Placed This Encounter  Procedures  . EKG 12-Lead     Disposition:   The patient will continue current medication.  She was once again encouraged to quit smoking altogether.  Recheck in 6 months for office visit with Dr.Nahser.  Berna Spare MD 10/17/2015 5:14 PM    [facility] [place], [place], [place]  [address] Phone: [phone]; Fax: [phone]

## 2015-11-01 ENCOUNTER — Encounter (HOSPITAL_COMMUNITY): Payer: Self-pay | Admitting: *Deleted

## 2015-11-01 ENCOUNTER — Emergency Department (HOSPITAL_COMMUNITY): Payer: Managed Care, Other (non HMO)

## 2015-11-01 ENCOUNTER — Inpatient Hospital Stay (HOSPITAL_COMMUNITY)
Admission: EM | Admit: 2015-11-01 | Discharge: 2015-11-08 | DRG: 177 | Disposition: A | Discharge status: Home / Self Care | Payer: Managed Care, Other (non HMO) | Attending: Internal Medicine | Admitting: Internal Medicine

## 2015-11-01 ENCOUNTER — Inpatient Hospital Stay (HOSPITAL_COMMUNITY): Payer: Managed Care, Other (non HMO)

## 2015-11-01 DIAGNOSIS — Z87891 Personal history of nicotine dependence: Secondary | ICD-10-CM | POA: Diagnosis not present

## 2015-11-01 DIAGNOSIS — E876 Hypokalemia: Secondary | ICD-10-CM | POA: Diagnosis present

## 2015-11-01 DIAGNOSIS — J9601 Acute respiratory failure with hypoxia: Secondary | ICD-10-CM | POA: Diagnosis present

## 2015-11-01 DIAGNOSIS — J439 Emphysema, unspecified: Secondary | ICD-10-CM | POA: Diagnosis not present

## 2015-11-01 DIAGNOSIS — J690 Pneumonitis due to inhalation of food and vomit: Secondary | ICD-10-CM | POA: Diagnosis present

## 2015-11-01 DIAGNOSIS — R0600 Dyspnea, unspecified: Secondary | ICD-10-CM | POA: Diagnosis not present

## 2015-11-01 DIAGNOSIS — E871 Hypo-osmolality and hyponatremia: Secondary | ICD-10-CM | POA: Diagnosis present

## 2015-11-01 DIAGNOSIS — R55 Syncope and collapse: Secondary | ICD-10-CM | POA: Diagnosis present

## 2015-11-01 DIAGNOSIS — J189 Pneumonia, unspecified organism: Secondary | ICD-10-CM | POA: Diagnosis present

## 2015-11-01 DIAGNOSIS — Z9181 History of falling: Secondary | ICD-10-CM

## 2015-11-01 DIAGNOSIS — R42 Dizziness and giddiness: Secondary | ICD-10-CM | POA: Diagnosis not present

## 2015-11-01 DIAGNOSIS — I959 Hypotension, unspecified: Secondary | ICD-10-CM | POA: Diagnosis present

## 2015-11-01 DIAGNOSIS — F418 Other specified anxiety disorders: Secondary | ICD-10-CM | POA: Diagnosis present

## 2015-11-01 DIAGNOSIS — Z6823 Body mass index (BMI) 23.0-23.9, adult: Secondary | ICD-10-CM | POA: Diagnosis not present

## 2015-11-01 DIAGNOSIS — F329 Major depressive disorder, single episode, unspecified: Secondary | ICD-10-CM | POA: Diagnosis present

## 2015-11-01 DIAGNOSIS — J471 Bronchiectasis with (acute) exacerbation: Secondary | ICD-10-CM | POA: Diagnosis not present

## 2015-11-01 DIAGNOSIS — J849 Interstitial pulmonary disease, unspecified: Secondary | ICD-10-CM

## 2015-11-01 DIAGNOSIS — J449 Chronic obstructive pulmonary disease, unspecified: Secondary | ICD-10-CM | POA: Diagnosis present

## 2015-11-01 DIAGNOSIS — J151 Pneumonia due to Pseudomonas: Principal | ICD-10-CM | POA: Diagnosis present

## 2015-11-01 DIAGNOSIS — F17210 Nicotine dependence, cigarettes, uncomplicated: Secondary | ICD-10-CM | POA: Diagnosis present

## 2015-11-01 DIAGNOSIS — R0902 Hypoxemia: Secondary | ICD-10-CM | POA: Diagnosis not present

## 2015-11-01 DIAGNOSIS — R404 Transient alteration of awareness: Secondary | ICD-10-CM | POA: Diagnosis not present

## 2015-11-01 DIAGNOSIS — J470 Bronchiectasis with acute lower respiratory infection: Secondary | ICD-10-CM | POA: Diagnosis not present

## 2015-11-01 DIAGNOSIS — I10 Essential (primary) hypertension: Secondary | ICD-10-CM | POA: Diagnosis present

## 2015-11-01 DIAGNOSIS — E440 Moderate protein-calorie malnutrition: Secondary | ICD-10-CM | POA: Diagnosis present

## 2015-11-01 DIAGNOSIS — K219 Gastro-esophageal reflux disease without esophagitis: Secondary | ICD-10-CM | POA: Diagnosis present

## 2015-11-01 DIAGNOSIS — E43 Unspecified severe protein-calorie malnutrition: Secondary | ICD-10-CM | POA: Insufficient documentation

## 2015-11-01 DIAGNOSIS — R1313 Dysphagia, pharyngeal phase: Secondary | ICD-10-CM | POA: Diagnosis present

## 2015-11-01 LAB — CBC WITH DIFFERENTIAL/PLATELET
Basophils Absolute: 0 10*3/uL (ref 0.0–0.1)
Basophils Relative: 0 %
Eosinophils Absolute: 0 10*3/uL (ref 0.0–0.7)
Eosinophils Relative: 0 %
HCT: 30.6 % — ABNORMAL LOW (ref 36.0–46.0)
Hemoglobin: 10.3 g/dL — ABNORMAL LOW (ref 12.0–15.0)
Lymphocytes Relative: 13 %
Lymphs Abs: 1.3 10*3/uL (ref 0.7–4.0)
MCH: 28.3 pg (ref 26.0–34.0)
MCHC: 33.7 g/dL (ref 30.0–36.0)
MCV: 84.1 fL (ref 78.0–100.0)
Monocytes Absolute: 0.7 10*3/uL (ref 0.1–1.0)
Monocytes Relative: 8 %
Neutro Abs: 7.6 10*3/uL (ref 1.7–7.7)
Neutrophils Relative %: 79 %
Platelets: 183 10*3/uL (ref 150–400)
RBC: 3.64 MIL/uL — ABNORMAL LOW (ref 3.87–5.11)
RDW: 14.1 % (ref 11.5–15.5)
WBC: 9.7 10*3/uL (ref 4.0–10.5)

## 2015-11-01 LAB — BASIC METABOLIC PANEL
Anion gap: 8 (ref 5–15)
BUN: 6 mg/dL (ref 6–20)
CO2: 39 mmol/L — ABNORMAL HIGH (ref 22–32)
Calcium: 8.2 mg/dL — ABNORMAL LOW (ref 8.9–10.3)
Chloride: 77 mmol/L — ABNORMAL LOW (ref 101–111)
Creatinine, Ser: 0.49 mg/dL (ref 0.44–1.00)
GFR calc Af Amer: >60 mL/min (ref >60)
GFR calc non Af Amer: >60 mL/min (ref >60)
Glucose, Bld: 124 mg/dL — ABNORMAL HIGH (ref 65–99)
Potassium: 2.1 mmol/L — CL (ref 3.5–5.1)
Sodium: 124 mmol/L — ABNORMAL LOW (ref 135–145)

## 2015-11-01 LAB — SEDIMENTATION RATE: SED RATE: 78 mm/h — AB (ref 0–22)

## 2015-11-01 LAB — URINALYSIS, ROUTINE W REFLEX MICROSCOPIC
Bilirubin Urine: NEGATIVE
Glucose, UA: NEGATIVE mg/dL
Hgb urine dipstick: NEGATIVE
Ketones, ur: NEGATIVE mg/dL
Leukocytes, UA: NEGATIVE
Nitrite: NEGATIVE
Protein, ur: NEGATIVE mg/dL
Specific Gravity, Urine: 1.005 (ref 1.005–1.030)
pH: 6 (ref 5.0–8.0)

## 2015-11-01 LAB — I-STAT CG4 LACTIC ACID, ED
Lactic Acid, Venous: 1.83 mmol/L (ref 0.5–2.0)
Lactic Acid, Venous: 1 mmol/L (ref 0.5–2.0)

## 2015-11-01 LAB — MAGNESIUM: Magnesium: 1.9 mg/dL (ref 1.7–2.4)

## 2015-11-01 LAB — C-REACTIVE PROTEIN: CRP: 16 mg/dL — ABNORMAL HIGH (ref <1.0)

## 2015-11-01 MED ORDER — ONDANSETRON HCL 4 MG/2ML IJ SOLN: 4.0000 mg | Freq: Four times a day (QID) | INTRAMUSCULAR | Status: DC | PRN

## 2015-11-01 MED ORDER — CETYLPYRIDINIUM CHLORIDE 0.05 % MT LIQD
7.0000 mL | Freq: Two times a day (BID) | OROMUCOSAL | Status: DC
  Administered 2015-11-02 – 2015-11-07 (×11): 7 mL via OROMUCOSAL

## 2015-11-01 MED ORDER — ALPRAZOLAM 0.5 MG PO TABS
0.5000 mg | ORAL_TABLET | Freq: Every day | ORAL | Status: DC
  Administered 2015-11-01 – 2015-11-07 (×7): 0.5 mg via ORAL
  Filled 2015-11-01 (×7): qty 1

## 2015-11-01 MED ORDER — ONDANSETRON HCL 4 MG PO TABS: 4.0000 mg | ORAL_TABLET | Freq: Four times a day (QID) | ORAL | Status: DC | PRN

## 2015-11-01 MED ORDER — FUROSEMIDE 10 MG/ML IJ SOLN: 40.0000 mg | Freq: Once | INTRAMUSCULAR | Status: DC

## 2015-11-01 MED ORDER — POTASSIUM CHLORIDE CRYS ER 20 MEQ PO TBCR
40.0000 meq | EXTENDED_RELEASE_TABLET | Freq: Once | ORAL | Status: AC
  Administered 2015-11-01: 40 meq via ORAL
  Filled 2015-11-01: qty 2

## 2015-11-01 MED ORDER — NORTRIPTYLINE HCL 25 MG PO CAPS
100.0000 mg | ORAL_CAPSULE | Freq: Every day | ORAL | Status: DC
  Administered 2015-11-01 – 2015-11-07 (×7): 100 mg via ORAL
  Filled 2015-11-01 (×8): qty 4

## 2015-11-01 MED ORDER — INFLUENZA VAC SPLIT QUAD 0.5 ML IM SUSY
0.5000 mL | PREFILLED_SYRINGE | INTRAMUSCULAR | Status: DC
  Filled 2015-11-01: qty 0.5

## 2015-11-01 MED ORDER — ENOXAPARIN SODIUM 40 MG/0.4ML ~~LOC~~ SOLN
40.0000 mg | SUBCUTANEOUS | Status: DC
  Administered 2015-11-01 – 2015-11-07 (×7): 40 mg via SUBCUTANEOUS
  Filled 2015-11-01 (×7): qty 0.4

## 2015-11-01 MED ORDER — SODIUM CHLORIDE 0.9 % IV SOLN
INTRAVENOUS | Status: DC
Start: 2015-11-01 — End: 2015-11-08
  Administered 2015-11-01 – 2015-11-08 (×9): via INTRAVENOUS

## 2015-11-01 MED ORDER — VANCOMYCIN HCL 500 MG IV SOLR
500.0000 mg | Freq: Two times a day (BID) | INTRAVENOUS | Status: DC
  Filled 2015-11-01 (×2): qty 500

## 2015-11-01 MED ORDER — DEXTROSE 5 % IV SOLN
2.0000 g | Freq: Once | INTRAVENOUS | Status: AC
  Administered 2015-11-01: 2 g via INTRAVENOUS
  Filled 2015-11-01: qty 2

## 2015-11-01 MED ORDER — DEXTROSE 5 % IV SOLN
2.0000 g | Freq: Three times a day (TID) | INTRAVENOUS | Status: DC
  Administered 2015-11-01 – 2015-11-08 (×20): 2 g via INTRAVENOUS
  Filled 2015-11-01 (×23): qty 2

## 2015-11-01 MED ORDER — POTASSIUM CHLORIDE 10 MEQ/100ML IV SOLN: 10.0000 meq | Freq: Once | INTRAVENOUS | Status: DC

## 2015-11-01 MED ORDER — SODIUM CHLORIDE 0.9 % IV BOLUS (SEPSIS)
1000.0000 mL | Freq: Once | INTRAVENOUS | Status: AC
  Administered 2015-11-01: 1000 mL via INTRAVENOUS

## 2015-11-01 MED ORDER — DIVALPROEX SODIUM ER 500 MG PO TB24
500.0000 mg | ORAL_TABLET | Freq: Every day | ORAL | Status: DC
  Administered 2015-11-01 – 2015-11-07 (×7): 500 mg via ORAL
  Filled 2015-11-01 (×7): qty 1

## 2015-11-01 MED ORDER — ALBUTEROL SULFATE (2.5 MG/3ML) 0.083% IN NEBU: 2.5000 mg | INHALATION_SOLUTION | RESPIRATORY_TRACT | Status: DC | PRN

## 2015-11-01 MED ORDER — PNEUMOCOCCAL VAC POLYVALENT 25 MCG/0.5ML IJ INJ
0.5000 mL | INJECTION | INTRAMUSCULAR | Status: AC
  Administered 2015-11-02: 0.5 mL via INTRAMUSCULAR
  Filled 2015-11-01: qty 0.5

## 2015-11-01 MED ORDER — MOMETASONE FURO-FORMOTEROL FUM 100-5 MCG/ACT IN AERO
2.0000 | INHALATION_SPRAY | Freq: Two times a day (BID) | RESPIRATORY_TRACT | Status: DC
  Administered 2015-11-01 – 2015-11-08 (×14): 2 via RESPIRATORY_TRACT
  Filled 2015-11-01: qty 8.8

## 2015-11-01 MED ORDER — GUAIFENESIN ER 600 MG PO TB12
600.0000 mg | ORAL_TABLET | Freq: Two times a day (BID) | ORAL | Status: DC
  Administered 2015-11-02 – 2015-11-08 (×14): 600 mg via ORAL
  Filled 2015-11-01 (×14): qty 1

## 2015-11-01 MED ORDER — VANCOMYCIN HCL 500 MG IV SOLR
500.0000 mg | Freq: Two times a day (BID) | INTRAVENOUS | Status: DC
  Administered 2015-11-02 – 2015-11-03 (×3): 500 mg via INTRAVENOUS
  Filled 2015-11-01 (×5): qty 500

## 2015-11-01 MED ORDER — POTASSIUM CHLORIDE 10 MEQ/100ML IV SOLN
10.0000 meq | INTRAVENOUS | Status: AC
  Administered 2015-11-01 (×3): 10 meq via INTRAVENOUS
  Filled 2015-11-01 (×3): qty 100

## 2015-11-01 MED ORDER — VANCOMYCIN HCL IN DEXTROSE 1-5 GM/200ML-% IV SOLN
1000.0000 mg | Freq: Once | INTRAVENOUS | Status: AC
  Administered 2015-11-01: 1000 mg via INTRAVENOUS
  Filled 2015-11-01: qty 200

## 2015-11-01 MED ORDER — HYDROCODONE-ACETAMINOPHEN 5-325 MG PO TABS
1.0000 | ORAL_TABLET | ORAL | Status: DC | PRN
  Administered 2015-11-01 – 2015-11-02 (×2): 1 via ORAL
  Filled 2015-11-01 (×2): qty 1

## 2015-11-01 MED ORDER — LEVOFLOXACIN IN D5W 750 MG/150ML IV SOLN: 750.0000 mg | Freq: Once | INTRAVENOUS | Status: DC

## 2015-11-01 MED ORDER — ENSURE ENLIVE PO LIQD
237.0000 mL | Freq: Two times a day (BID) | ORAL | Status: DC
  Administered 2015-11-02 – 2015-11-08 (×12): 237 mL via ORAL

## 2015-11-01 MED ORDER — TIZANIDINE HCL 4 MG PO TABS: 4.0000 mg | ORAL_TABLET | Freq: Four times a day (QID) | ORAL | Status: DC | PRN

## 2015-11-01 NOTE — ED Provider Notes (Signed)
CSN: [id number]     Arrival date & time 11/01/15  48 History   First MD Initiated Contact with Patient 11/01/15 1056     Chief Complaint  Patient presents with  . Fall   HPI   64 year old female presents today status post fall. Patient reports a 6 month history of dizziness. She reports this is worse when going from sitting to standing and walking. She states that she has been seen previously for this, until this likely related to her low blood pressure. Patient reports that she has no symptoms while resting, denies associated shortness of breath, chest pain, diaphoresis. Patient reports a significant past medical history of frequent falls from the dizziness. She reports that she has not been drinking adequate amount of fluid over the last several days, reports taking her blood pressure medication this morning with a drink of Coke. She notes that 4 days ago she was constipated, took MiraLAX, and had 2 days of diarrhea. She reports she is extremely thirsty today. Patient reports falling today, no loss of consciousness, attempted to catch herself but was unable to. She reports striking the posterior aspect of her head, and landing on her butt as well. She reports pain to the head, pain to the hips bilateral. Patient denies any fever, chills, nausea, vomiting, abdominal pain, lower actually swelling or edema. She does note a productive cough that has not worsened recently. Significant past medical history of pneumonia with most recent hospitalization on August 2016, patient was treated with IV antibiotics with SNF care. Patient followed up with cardiology on 10/10/2015 and was instructed to repeat chest x-ray in 3-4 weeks to monitor for improvement.   Past Medical History  Diagnosis Date  . Hypertension     essential hypertension  . Tobacco abuse     smoking about 1/2 pk of cigarettes a day  . Anxiety   . Depression     psychiatrist Dr. Toy Care  . Emphysematous COPD ([place])     changes in right base  along with patchy areas on last x-ray  . COPD (chronic obstructive pulmonary disease) ([place])   . History of echocardiogram 12/24/2006    Est. EF of 66-44% NORMAL LV SYSTOLIC FUNCTION WITH IMPAIRED RELAXATION -- MILD AORTIC SCLEROSIS -- NORMAL PALONARY ARTERY PRESSURE -- NO OLD ECHOS FOR COMPARISON -- [name], MD  . History of cardiovascular stress test 08/15/2004    EF of 70% -- Normal stress cardiolite.  There is no evidence of ischemia and there is normal LV function. -- [name]. MD  . Diastolic dysfunction   . Allergy   . Asthma   . Heart murmur   . Pneumonia     "several times since May 2015" (07/06/2014)  . Chronic bronchitis ([place])     "get it alot; maybe not q yr" (07/06/2014)  . GERD (gastroesophageal reflux disease)    Past Surgical History  Procedure Laterality Date  . Tubal ligation  1986   Family History  Problem Relation Age of Onset  . Stroke Mother   . Heart disease Father   . Hyperlipidemia Father   . Hypertension Father   . Breast cancer Maternal Aunt   . Asthma Maternal Grandmother    Social History  Substance Use Topics  . Smoking status: Former Smoker -- 1.00 packs/day for 42 years    Types: Cigarettes    Quit date: 10/18/2015  . Smokeless tobacco: Never Used  . Alcohol Use: 0.0 oz/week    0 Standard drinks or  equivalent per week     Comment: sicial   OB History    No data available     Review of Systems  All other systems reviewed and are negative.   Allergies  Ceclor; Escitalopram oxalate; Sertraline hcl; and Sulfa drugs cross reactors  Home Medications   Prior to Admission medications   Medication Sig Start Date End Date Taking? Authorizing Provider  ALPRAZolam Duanne Moron) 0.5 MG tablet Take 0.5 mg by mouth at bedtime.    Yes Historical Provider, MD  Calcium Carbonate-Vitamin D (CALCIUM-VITAMIN D) 500-200 MG-UNIT per tablet Take 1 tablet by mouth daily.   Yes Historical Provider, MD  divalproex (DEPAKOTE ER) 500 MG 24 hr tablet  Take 500 mg by mouth at bedtime.  02/22/12  Yes Historical Provider, MD  fluticasone (FLONASE) 50 MCG/ACT nasal spray Place 2 sprays into both nostrils daily. Patient taking differently: Place 2 sprays into both nostrils daily as needed for allergies.  06/06/15  Yes Brand Males, MD  furosemide (LASIX) 20 MG tablet TAKE 2 TABLETS BY MOUTH EVERY DAY 05/25/15  Yes [name], MD  HYDROcodone-acetaminophen (NORCO/VICODIN) 5-325 MG tablet Take 1 tablet by mouth every 4 (four) hours as needed for moderate pain (or cough). 09/27/15  Yes Tanda Rockers, MD  losartan-hydrochlorothiazide (HYZAAR) 50-12.5 MG tablet Take 0.5 tablets by mouth daily. 09/28/15  Yes Historical Provider, MD  mometasone-formoterol (DULERA) 100-5 MCG/ACT AERO Take 2 puffs first thing in am and then another 2 puffs about 12 hours later. Patient taking differently: Inhale 2 puffs into the lungs 2 (two) times daily. Take 2 puffs first thing in am and then another 2 puffs about 12 hours later. 09/27/15  Yes Tanda Rockers, MD  Multiple Vitamin (MULTIVITAMIN) tablet Take 1 tablet by mouth daily.   Yes Historical Provider, MD  nadolol (CORGARD) 40 MG tablet Take 20 mg by mouth daily.  09/28/15  Yes Historical Provider, MD  nortriptyline (PAMELOR) 50 MG capsule Take 100 mg by mouth at bedtime.   Yes Historical Provider, MD  tiZANidine (ZANAFLEX) 4 MG tablet Take 1 tablet (4 mg total) by mouth every 6 (six) hours as needed for muscle spasms. 09/13/15  Yes Tishira R Brewington, PA-C  amoxicillin-clavulanate (AUGMENTIN) 875-125 MG tablet Take 1 tablet by mouth daily. 09/29/15   Historical Provider, MD  benzonatate (TESSALON) 100 MG capsule TAKE 1 TO 2 CAPSULES BY MOUTH 3 TIMES DAILY AS NEEDED FOR COUGH Patient not taking: Reported on 11/01/2015 10/03/15   Wardell Honour, MD  ipratropium (ATROVENT) 0.03 % nasal spray Place 2 sprays into both nostrils 2 (two) times daily. 07/16/15   Historical Provider, MD  saccharomyces boulardii (FLORASTOR)  250 MG capsule Take 1 capsule (250 mg total) by mouth 2 (two) times daily. Patient not taking: Reported on 11/01/2015 08/21/15   Charlynne Cousins, MD   BP 100/50 mmHg  Pulse 67  Temp(Src) 98 F (36.7 C) (Oral)  Resp 20  Ht '5\' 3"'$  (1.6 m)  Wt 58.968 kg  BMI 23.03 kg/m2  SpO2 97%   Physical Exam  Constitutional: She is oriented to person, place, and time. She appears well-developed.  HENT:  Head: Normocephalic and atraumatic.  Dry mucous membranes, head atraumatic, neck supple full active range of motion nontender to palpation  Eyes: Conjunctivae are normal. Pupils are equal, round, and reactive to light. Right eye exhibits no discharge. Left eye exhibits no discharge. No scleral icterus.  Neck: Normal range of motion. Neck supple. No JVD present. No tracheal deviation  present.  Cardiovascular: Normal rate, regular rhythm, normal heart sounds and intact distal pulses.  Exam reveals no gallop and no friction rub.   No murmur heard. Pulmonary/Chest: Effort normal. No stridor. No respiratory distress. She has no wheezes. She has no rales. She exhibits no tenderness.  Decreased breath sounds on the right  Abdominal: Soft. She exhibits no distension and no mass. There is no tenderness. There is no rebound and no guarding.  Musculoskeletal: Normal range of motion. She exhibits no edema or tenderness.  CT and L-spine nontender to palpation, no signs of soft tissue injury. Patient has tenderness to palpation of the posterior hip bilaterally, worse on the right, she has full active range of motion to the hips knees and ankles bilaterally, sensation grossly intact, cap refill less than 3 seconds, no lower extremity swelling or edema.  Neurological: She is alert and oriented to person, place, and time. She has normal strength. No cranial nerve deficit or sensory deficit. Coordination normal. GCS eye subscore is 4. GCS verbal subscore is 5. GCS motor subscore is 6.  Skin: Skin is warm and dry. No rash  noted. No erythema. No pallor.  Psychiatric: She has a normal mood and affect. Her behavior is normal. Judgment and thought content normal.  Nursing note and vitals reviewed.   ED Course  Procedures (including critical care time) Labs Review Labs Reviewed  CBC WITH DIFFERENTIAL/PLATELET - Abnormal; Notable for the following:    RBC 3.64 (*)    Hemoglobin 10.3 (*)    HCT 30.6 (*)    All other components within normal limits  BASIC METABOLIC PANEL - Abnormal; Notable for the following:    Sodium 124 (*)    Potassium 2.1 (*)    Chloride 77 (*)    CO2 39 (*)    Glucose, Bld 124 (*)    Calcium 8.2 (*)    All other components within normal limits  URINALYSIS, ROUTINE W REFLEX MICROSCOPIC (NOT AT [facility])  I-STAT CG4 LACTIC ACID, ED  I-STAT CG4 LACTIC ACID, ED    Imaging Review Dg Chest 2 View  11/01/2015  CLINICAL DATA:  Weakness, dizziness at work, fall, cough EXAM: CHEST  2 VIEW COMPARISON:  09/27/2015 FINDINGS: Worsening infiltrate/ pneumonia in right lower lobe and middle lobe. Cardiomediastinal silhouette is stable. Left lung is clear. IMPRESSION: Worsening infiltrate/pneumonia in right lower lobe and right middle lobe. Follow-up to resolution after appropriate treatment is recommended. Electronically Signed   By: Lahoma Crocker M.D.   On: 11/01/2015 13:11   Ct Head Wo Contrast  11/01/2015  CLINICAL DATA:  Pt fell and hit back of head and buttocks. Dizzy. States this is the second time she has fallen this week. Feels weak. Also incontinent EXAM: CT HEAD WITHOUT CONTRAST TECHNIQUE: Contiguous axial images were obtained from the base of the skull through the vertex without intravenous contrast. COMPARISON:  07/08/2015 FINDINGS: There is central and cortical atrophy. There is no intra or extra-axial fluid collection or mass lesion. The basilar cisterns and ventricles have a normal appearance. There is no CT evidence for acute infarction or hemorrhage. Bone windows demonstrate significant  opacification of the left maxillary sinus, similar in appearance to the previous exam. There is improved aeration of the right maxillary sinus although there is persistent mucoperiosteal thickening. No air-fluid levels are identified. No calvarial fracture. The mastoid air cells are normally aerated. IMPRESSION: 1.  No evidence for acute intracranial abnormality. 2. Chronic sinus disease. Electronically Signed   By: Benjamine Mola  Owens Shark M.D.   On: 11/01/2015 13:42   Dg Hips Bilat With Pelvis 3-4 Views  11/01/2015  CLINICAL DATA:  Fall EXAM: DG HIP (WITH OR WITHOUT PELVIS) 3-4V BILAT COMPARISON:  None. FINDINGS: There is no evidence of hip fracture or dislocation. There is no evidence of arthropathy or other focal bone abnormality. IMPRESSION: Negative. Electronically Signed   By: Franchot Gallo M.D.   On: 11/01/2015 13:12   I have personally reviewed and evaluated these images and lab results as part of my medical decision-making.   EKG Interpretation   Date/Time:  Thursday November 01 2015 10:51:13 EST Ventricular Rate:  72 PR Interval:  200 QRS Duration: 94 QT Interval:  455 QTC Calculation: 498 R Axis:   52 Text Interpretation:  Sinus rhythm Low voltage, precordial leads  Nonspecific repol abnormality, lateral leads No significant change since  last tracing Confirmed by KNOTT MD, Quillian Quince ([id number]) on 11/01/2015 11:06:24  AM      MDM   Final diagnoses:  Community acquired pneumonia    Labs: Urinalysis, i-STAT lactic acid, CBC, BMP- significant for potassium of 2.1, CO2 of 39  Imaging: CT head without contrast, DG chest 2 view, DG hips bilateral with pelvis- worsening infiltrate pneumonia and right lower lobe and right middle lobe comparison from 09/27/2015  Consults:Dr. [place]  Therapeutics: Vancomycin, potassium, Lasix  Discharge Meds:   Assessment/Plan: Patient presents with near-syncope with a fall. She was found to have worsening signs of pneumonia on chest x-ray, hypoxemia,  hypokalemia. Patient was hydrated here in the ED with an improvement in her blood pressure, 2 L wide improvement in hypoxemia, potassium given, IV antibiotics ordered. Due to patient's presentation is concerning signs or symptoms on diagnostic imaging patient will be admitted to the hospitalist with a pulmonary consult. Patient remained stable while here in the ED, she was tolerating by mouth.      Okey Regal, PA-C 11/01/15 1528  Leo Grosser, MD 11/02/15 [phone]

## 2015-11-01 NOTE — ED Notes (Signed)
Attempted report 

## 2015-11-01 NOTE — H&P (Signed)
PCP:   [name], MD   Chief Complaint:  Fall  HPI: 64 yr old female who   has a past medical history of Hypertension; Tobacco abuse; Anxiety; Depression; Emphysematous COPD ([place]); COPD (chronic obstructive pulmonary disease) ([place]); History of echocardiogram (12/24/2006); History of cardiovascular stress test (08/15/2004); Diastolic dysfunction; Allergy; Asthma; Heart murmur; Pneumonia; Chronic bronchitis ([place]); and GERD (gastroesophageal reflux disease). Today patient comes to the hospital after she had a fall at home hitting her head on the concrete. Patient says that she has been dizzy for a while and dizziness become worse from sitting to standing and walking. She denies chest pain or shortness of breath. She has been feeling dizzy. Patient also complains of extreme thirst. She takes Lasix as well as hydrochlorothiazide at home. In the ED imaging studies were done including CT head, bilateral hip x-rays which were negative for significant abnormality. Chest x-ray again shows worsening pneumonia. Patient was diagnosed with pneumonia in March 2016, and is still ongoing with intermittent flareups. Patient was seen by pulmonary on 10/10/2015 and there was a plan for the FOB /BAL if no improvement.  Lab work in the ED revealed hyponatremia with sodium 124, hypokalemia with potassium 2.1. Lactic acid 1.83, repeat lactic acid 1.0  Allergies:   Allergies  Allergen Reactions  . Ceclor [Cefaclor] Hives  . Escitalopram Oxalate     Pt does not recall ever taking medication  . Sertraline Hcl     UNKNOWN  . Sulfa Drugs Cross Reactors Hives and Rash    Hives and rash      Past Medical History  Diagnosis Date  . Hypertension     essential hypertension  . Tobacco abuse     smoking about 1/2 pk of cigarettes a day  . Anxiety   . Depression     psychiatrist Dr. Toy Care  . Emphysematous COPD ([place])     changes in right base along with patchy areas on last x-ray  . COPD (chronic obstructive  pulmonary disease) ([place])   . History of echocardiogram 12/24/2006    Est. EF of 74-25% NORMAL LV SYSTOLIC FUNCTION WITH IMPAIRED RELAXATION -- MILD AORTIC SCLEROSIS -- NORMAL PALONARY ARTERY PRESSURE -- NO OLD ECHOS FOR COMPARISON -- [name], MD  . History of cardiovascular stress test 08/15/2004    EF of 70% -- Normal stress cardiolite.  There is no evidence of ischemia and there is normal LV function. -- [name]. MD  . Diastolic dysfunction   . Allergy   . Asthma   . Heart murmur   . Pneumonia     "several times since May 2015" (07/06/2014)  . Chronic bronchitis ([place])     "get it alot; maybe not q yr" (07/06/2014)  . GERD (gastroesophageal reflux disease)     Past Surgical History  Procedure Laterality Date  . Tubal ligation  1986    Prior to Admission medications   Medication Sig Start Date End Date Taking? Authorizing Provider  ALPRAZolam Duanne Moron) 0.5 MG tablet Take 0.5 mg by mouth at bedtime.    Yes Historical Provider, MD  Calcium Carbonate-Vitamin D (CALCIUM-VITAMIN D) 500-200 MG-UNIT per tablet Take 1 tablet by mouth daily.   Yes Historical Provider, MD  divalproex (DEPAKOTE ER) 500 MG 24 hr tablet Take 500 mg by mouth at bedtime.  02/22/12  Yes Historical Provider, MD  fluticasone (FLONASE) 50 MCG/ACT nasal spray Place 2 sprays into both nostrils daily. Patient taking differently: Place 2 sprays into both nostrils daily as  needed for allergies.  06/06/15  Yes Brand Males, MD  furosemide (LASIX) 20 MG tablet TAKE 2 TABLETS BY MOUTH EVERY DAY 05/25/15  Yes [name], MD  HYDROcodone-acetaminophen (NORCO/VICODIN) 5-325 MG tablet Take 1 tablet by mouth every 4 (four) hours as needed for moderate pain (or cough). 09/27/15  Yes Tanda Rockers, MD  losartan-hydrochlorothiazide (HYZAAR) 50-12.5 MG tablet Take 0.5 tablets by mouth daily. 09/28/15  Yes Historical Provider, MD  mometasone-formoterol (DULERA) 100-5 MCG/ACT AERO Take 2 puffs first thing in am and then  another 2 puffs about 12 hours later. Patient taking differently: Inhale 2 puffs into the lungs 2 (two) times daily. Take 2 puffs first thing in am and then another 2 puffs about 12 hours later. 09/27/15  Yes Tanda Rockers, MD  Multiple Vitamin (MULTIVITAMIN) tablet Take 1 tablet by mouth daily.   Yes Historical Provider, MD  nadolol (CORGARD) 40 MG tablet Take 20 mg by mouth daily.  09/28/15  Yes Historical Provider, MD  nortriptyline (PAMELOR) 50 MG capsule Take 100 mg by mouth at bedtime.   Yes Historical Provider, MD  tiZANidine (ZANAFLEX) 4 MG tablet Take 1 tablet (4 mg total) by mouth every 6 (six) hours as needed for muscle spasms. 09/13/15  Yes Tishira R Brewington, PA-C  amoxicillin-clavulanate (AUGMENTIN) 875-125 MG tablet Take 1 tablet by mouth daily. 09/29/15   Historical Provider, MD  benzonatate (TESSALON) 100 MG capsule TAKE 1 TO 2 CAPSULES BY MOUTH 3 TIMES DAILY AS NEEDED FOR COUGH Patient not taking: Reported on 11/01/2015 10/03/15   Wardell Honour, MD  ipratropium (ATROVENT) 0.03 % nasal spray Place 2 sprays into both nostrils 2 (two) times daily. 07/16/15   Historical Provider, MD  saccharomyces boulardii (FLORASTOR) 250 MG capsule Take 1 capsule (250 mg total) by mouth 2 (two) times daily. Patient not taking: Reported on 11/01/2015 08/21/15   Charlynne Cousins, MD    Social History:  reports that she quit smoking about 2 weeks ago. Her smoking use included Cigarettes. She has a 42 pack-year smoking history. She has never used smokeless tobacco. She reports that she drinks alcohol. She reports that she does not use illicit drugs.  Family History  Problem Relation Age of Onset  . Stroke Mother   . Heart disease Father   . Hyperlipidemia Father   . Hypertension Father   . Breast cancer Maternal Aunt   . Asthma Maternal Grandmother     Filed Weights   11/01/15 1100  Weight: 58.968 kg (130 lb)    All the positives are listed in BOLD  Review of Systems:  HEENT:  Headache, blurred vision, runny nose, sore throat Neck: Hypothyroidism, hyperthyroidism,,lymphadenopathy Chest : Shortness of breath, history of COPD, Asthma, cough Heart : Chest pain, history of coronary arterey disease GI:  Nausea, vomiting, diarrhea, constipation, GERD GU: Dysuria, urgency, frequency of urination, hematuria Neuro: Stroke, seizures, syncope Psych: Depression, anxiety, hallucinations   Physical Exam: Blood pressure 105/57, pulse 67, temperature 98 F (36.7 C), temperature source Oral, resp. rate 17, height '5\' 3"'$  (1.6 m), weight 58.968 kg (130 lb), SpO2 99 %. Constitutional:   Patient is a well-developed and well-nourished female in no acute distress and cooperative with exam. Head: Normocephalic and atraumatic Mouth: Mucus membranes moist Eyes: PERRL, EOMI, conjunctivae normal Neck: Supple, No Thyromegaly Cardiovascular: RRR, S1 normal, S2 normal Pulmonary/Chest: Bibasilar crackles Abdominal: Soft. Non-tender, non-distended, bowel sounds are normal, no masses, organomegaly, or guarding present.  Neurological: A&O x3, Strength is normal and symmetric  bilaterally, cranial nerve II-XII are grossly intact, no focal motor deficit, sensory intact to light touch bilaterally.  Extremities : No Cyanosis, Clubbing or Edema  Labs on Admission:  Basic Metabolic Panel:  Recent Labs Lab 11/01/15 1141  NA 124*  K 2.1*  CL 77*  CO2 39*  GLUCOSE 124*  BUN 6  CREATININE 0.49  CALCIUM 8.2*   Liver Function Tests: No results for input(s): AST, ALT, ALKPHOS, BILITOT, PROT, ALBUMIN in the last 168 hours. No results for input(s): LIPASE, AMYLASE in the last 168 hours. No results for input(s): AMMONIA in the last 168 hours. CBC:  Recent Labs Lab 11/01/15 1141  WBC 9.7  NEUTROABS 7.6  HGB 10.3*  HCT 30.6*  MCV 84.1  PLT 183    CBG: No results for input(s): GLUCAP in the last 168 hours.  Radiological Exams on Admission: Dg Chest 2 View  11/01/2015  CLINICAL DATA:   Weakness, dizziness at work, fall, cough EXAM: CHEST  2 VIEW COMPARISON:  09/27/2015 FINDINGS: Worsening infiltrate/ pneumonia in right lower lobe and middle lobe. Cardiomediastinal silhouette is stable. Left lung is clear. IMPRESSION: Worsening infiltrate/pneumonia in right lower lobe and right middle lobe. Follow-up to resolution after appropriate treatment is recommended. Electronically Signed   By: Lahoma Crocker M.D.   On: 11/01/2015 13:11   Ct Head Wo Contrast  11/01/2015  CLINICAL DATA:  Pt fell and hit back of head and buttocks. Dizzy. States this is the second time she has fallen this week. Feels weak. Also incontinent EXAM: CT HEAD WITHOUT CONTRAST TECHNIQUE: Contiguous axial images were obtained from the base of the skull through the vertex without intravenous contrast. COMPARISON:  07/08/2015 FINDINGS: There is central and cortical atrophy. There is no intra or extra-axial fluid collection or mass lesion. The basilar cisterns and ventricles have a normal appearance. There is no CT evidence for acute infarction or hemorrhage. Bone windows demonstrate significant opacification of the left maxillary sinus, similar in appearance to the previous exam. There is improved aeration of the right maxillary sinus although there is persistent mucoperiosteal thickening. No air-fluid levels are identified. No calvarial fracture. The mastoid air cells are normally aerated. IMPRESSION: 1.  No evidence for acute intracranial abnormality. 2. Chronic sinus disease. Electronically Signed   By: Nolon Nations M.D.   On: 11/01/2015 13:42   Dg Hips Bilat With Pelvis 3-4 Views  11/01/2015  CLINICAL DATA:  Fall EXAM: DG HIP (WITH OR WITHOUT PELVIS) 3-4V BILAT COMPARISON:  None. FINDINGS: There is no evidence of hip fracture or dislocation. There is no evidence of arthropathy or other focal bone abnormality. IMPRESSION: Negative. Electronically Signed   By: Franchot Gallo M.D.   On: 11/01/2015 13:12    EKG: Independently  reviewed. Normal sinus rhythm   Assessment/Plan Active Problems:   Hyponatremia   Pneumonia  Right middle lobe and lower lobe pneumonia Patient was diagnosed with pneumonia in March 2016 and has been treated multiple times with IV antibiotics. CT chest on 9/15 showed widespread airspace consolidation throat admitted and lower lobe. She was seen by pulmonary on 10/10/2015 and plan was to do FOB/BAL if no improvement in next 4 weeks. She has been started on Fortaz and vancomycin for healthcare associated pneumonia. Will consult pulmonary for  further recommendations.  Hyponatremia Patient sodium is 124, likely from diuretics. Patient on Lasix and HCTZ at home. Will check serum and urine osmolality. Fluid restriction 1200 mL per day. Follow BMP in a.m.  Hypokalemia Replace potassium KCl 10  mg IV 3. Follow BMP in a.m.  Status post fall Likely from electrolyte abnormalities as well as hypotension. Hold diuretics, IV hydration.  DVT prophylaxis Lovenox  Code status: Full code  Family discussion: No family present at bedside   Time Spent on Admission: 84 min  [facility] Pager: [phone] 11/01/2015, 4:09 PM  If 7PM-7AM, please contact night-coverage  [email or web address]  Password [email or web address]

## 2015-11-01 NOTE — Consult Note (Signed)
Name: Catherine Kelley MRN: [id number] DOB: 10/25/1951    ADMISSION DATE:  11/01/2015 CONSULTATION DATE:  12/1  REFERRING MD :  Darrick Meigs ([facility])   CHIEF COMPLAINT:  PNA  BRIEF PATIENT DESCRIPTION:  64yo female smoker with hx COPD, RML/RLL bronchiectasis after a necrotizing PNA in 2015 with frequent bronchiectasis flares and ongoing outpt w/u of persistent RLL/RML infiltrate on CXR followed by Dr. Chase Caller.  Presented to ER 112/1 with persistent dizziness and falling as well as worsening productive cough.  Admitted by [facility] with ?HCAP v bronchiectasis flare.  CXR with progressive infiltrates and PCCM consulted.      SIGNIFICANT EVENTS    STUDIES:  CT head 12/1>>> neg acute, chronic sinus disease   HISTORY OF PRESENT ILLNESS:  64yo female smoker with hx COPD, RML/RLL bronchiectasis after a necrotizing PNA in 2015 with frequent bronchiectasis flares and ongoing outpt w/u of persistent RLL/RML infiltrate on CXR followed by Dr. Chase Caller.  Presented to ER 112/1 with persistent dizziness and falling as well as worsening productive cough.  Admitted by [facility] with ?HCAP v bronchiectasis flare.  CXR with progressive infiltrates and PCCM consulted.    Currently c/o hip pain and intermittent dizziness, some mild SOB but not above baseline.  Has ongoing productive cough with thick, yellow sputum.  Also c/o extreme thirst and dry mouth.  Denies chest pain, hemoptysis, weight loss, leg/calf pain, orthopnea, fevers, chills, night sweats.   PAST MEDICAL HISTORY :   has a past medical history of Hypertension; Tobacco abuse; Anxiety; Depression; Emphysematous COPD ([place]); COPD (chronic obstructive pulmonary disease) ([place]); History of echocardiogram (12/24/2006); History of cardiovascular stress test (08/15/2004); Diastolic dysfunction; Allergy; Asthma; Heart murmur; Pneumonia; Chronic bronchitis ([place]); and GERD (gastroesophageal reflux disease).  has past surgical history that includes Tubal ligation (1986). Prior to  Admission medications   Medication Sig Start Date End Date Taking? Authorizing Provider  ALPRAZolam Duanne Moron) 0.5 MG tablet Take 0.5 mg by mouth at bedtime.    Yes Historical Provider, MD  Calcium Carbonate-Vitamin D (CALCIUM-VITAMIN D) 500-200 MG-UNIT per tablet Take 1 tablet by mouth daily.   Yes Historical Provider, MD  divalproex (DEPAKOTE ER) 500 MG 24 hr tablet Take 500 mg by mouth at bedtime.  02/22/12  Yes Historical Provider, MD  fluticasone (FLONASE) 50 MCG/ACT nasal spray Place 2 sprays into both nostrils daily. Patient taking differently: Place 2 sprays into both nostrils daily as needed for allergies.  06/06/15  Yes Brand Males, MD  furosemide (LASIX) 20 MG tablet TAKE 2 TABLETS BY MOUTH EVERY DAY 05/25/15  Yes [name], MD  HYDROcodone-acetaminophen (NORCO/VICODIN) 5-325 MG tablet Take 1 tablet by mouth every 4 (four) hours as needed for moderate pain (or cough). 09/27/15  Yes Tanda Rockers, MD  losartan-hydrochlorothiazide (HYZAAR) 50-12.5 MG tablet Take 0.5 tablets by mouth daily. 09/28/15  Yes Historical Provider, MD  mometasone-formoterol (DULERA) 100-5 MCG/ACT AERO Take 2 puffs first thing in am and then another 2 puffs about 12 hours later. Patient taking differently: Inhale 2 puffs into the lungs 2 (two) times daily. Take 2 puffs first thing in am and then another 2 puffs about 12 hours later. 09/27/15  Yes Tanda Rockers, MD  Multiple Vitamin (MULTIVITAMIN) tablet Take 1 tablet by mouth daily.   Yes Historical Provider, MD  nadolol (CORGARD) 40 MG tablet Take 20 mg by mouth daily.  09/28/15  Yes Historical Provider, MD  nortriptyline (PAMELOR) 50 MG capsule Take 100 mg by mouth at bedtime.   Yes Historical Provider,  MD  tiZANidine (ZANAFLEX) 4 MG tablet Take 1 tablet (4 mg total) by mouth every 6 (six) hours as needed for muscle spasms. 09/13/15  Yes Tishira R Brewington, PA-C  amoxicillin-clavulanate (AUGMENTIN) 875-125 MG tablet Take 1 tablet by mouth daily. 09/29/15    Historical Provider, MD  benzonatate (TESSALON) 100 MG capsule TAKE 1 TO 2 CAPSULES BY MOUTH 3 TIMES DAILY AS NEEDED FOR COUGH Patient not taking: Reported on 11/01/2015 10/03/15   Wardell Honour, MD  ipratropium (ATROVENT) 0.03 % nasal spray Place 2 sprays into both nostrils 2 (two) times daily. 07/16/15   Historical Provider, MD  saccharomyces boulardii (FLORASTOR) 250 MG capsule Take 1 capsule (250 mg total) by mouth 2 (two) times daily. Patient not taking: Reported on 11/01/2015 08/21/15   Charlynne Cousins, MD   Allergies  Allergen Reactions  . Ceclor [Cefaclor] Hives  . Escitalopram Oxalate     Pt does not recall ever taking medication  . Sertraline Hcl     UNKNOWN  . Sulfa Drugs Cross Reactors Hives and Rash    Hives and rash    FAMILY HISTORY:  family history includes Asthma in her maternal grandmother; Breast cancer in her maternal aunt; Heart disease in her father; Hyperlipidemia in her father; Hypertension in her father; Stroke in her mother. SOCIAL HISTORY:  reports that she quit smoking about 2 weeks ago. Her smoking use included Cigarettes. She has a 42 pack-year smoking history. She has never used smokeless tobacco. She reports that she drinks alcohol. She reports that she does not use illicit drugs.  REVIEW OF SYSTEMS:   As per HPI - All other systems reviewed and were neg.   SUBJECTIVE:   VITAL SIGNS: Temp:  [98 F (36.7 C)] 98 F (36.7 C) (12/01 1100) Pulse Rate:  [66-71] 66 (12/01 1615) Resp:  [13-20] 18 (12/01 1615) BP: (88-111)/(45-63) 98/57 mmHg (12/01 1615) SpO2:  [90 %-100 %] 100 % (12/01 1615) Weight:  [130 lb (58.968 kg)] 130 lb (58.968 kg) (12/01 1100)  PHYSICAL EXAMINATION:  General:  Chronically ill appearing female, NAD in ER  Neuro:  Awake, alert, appropriate, MAE  HEENT:  Mm dry, no JVD  Cardiovascular:  s1s2 rrr Lungs:  resps even non labored on [place], RLL rhonchus, otherwise ess clear  Abdomen:  Round, soft, +bs  Musculoskeletal:  Warm and dry,  no edema   Recent Labs Lab 11/01/15 1141  NA 124*  K 2.1*  CL 77*  CO2 39*  BUN 6  CREATININE 0.49  GLUCOSE 124*    Recent Labs Lab 11/01/15 1141  HGB 10.3*  HCT 30.6*  WBC 9.7  PLT 183   Dg Chest 2 View  11/01/2015  CLINICAL DATA:  Weakness, dizziness at work, fall, cough EXAM: CHEST  2 VIEW COMPARISON:  09/27/2015 FINDINGS: Worsening infiltrate/ pneumonia in right lower lobe and middle lobe. Cardiomediastinal silhouette is stable. Left lung is clear. IMPRESSION: Worsening infiltrate/pneumonia in right lower lobe and right middle lobe. Follow-up to resolution after appropriate treatment is recommended. Electronically Signed   By: Lahoma Crocker M.D.   On: 11/01/2015 13:11   Ct Head Wo Contrast  11/01/2015  CLINICAL DATA:  Pt fell and hit back of head and buttocks. Dizzy. States this is the second time she has fallen this week. Feels weak. Also incontinent EXAM: CT HEAD WITHOUT CONTRAST TECHNIQUE: Contiguous axial images were obtained from the base of the skull through the vertex without intravenous contrast. COMPARISON:  07/08/2015 FINDINGS: There is central and  cortical atrophy. There is no intra or extra-axial fluid collection or mass lesion. The basilar cisterns and ventricles have a normal appearance. There is no CT evidence for acute infarction or hemorrhage. Bone windows demonstrate significant opacification of the left maxillary sinus, similar in appearance to the previous exam. There is improved aeration of the right maxillary sinus although there is persistent mucoperiosteal thickening. No air-fluid levels are identified. No calvarial fracture. The mastoid air cells are normally aerated. IMPRESSION: 1.  No evidence for acute intracranial abnormality. 2. Chronic sinus disease. Electronically Signed   By: Nolon Nations M.D.   On: 11/01/2015 13:42   Dg Hips Bilat With Pelvis 3-4 Views  11/01/2015  CLINICAL DATA:  Fall EXAM: DG HIP (WITH OR WITHOUT PELVIS) 3-4V BILAT COMPARISON:   None. FINDINGS: There is no evidence of hip fracture or dislocation. There is no evidence of arthropathy or other focal bone abnormality. IMPRESSION: Negative. Electronically Signed   By: Franchot Gallo M.D.   On: 11/01/2015 13:12    ASSESSMENT / PLAN:  COPD - without acute exacerbation  Recurrent PNA -- recurrent/worsening RML/RLL infiltrate.  ?aspiration.  Severe RML/RLL bronchiectasis   PLAN -  CT chest without contrast now Swallow eval  Cont IV abx as per primary to cover HCAP  Sputum culture  Autoimmune -- ANA, ANCA, HIV, ESR, RF BD's - cont home dulera, PRN albuterol  No need systemic steroids at this time  Supplemental O2 as needed  May need to consider FOB/BAL    Fall  DM  Hyponatremia  HTN Per Primary   Nickolas Madrid, NP 11/01/2015  4:37 PM Pager: [phone] or [phone]  Attending Note:   64 year old female with PMH of pneumonia that has not responded to abx, follows with Dr. Chase Caller in the office, who presents to [facility] after a fall due to dizziness. A CXR was done that revealed a RLL infiltrate that is worse than the one before in October. Patient reports SOB, dizziness and worsening RLL infiltrate. I reviewed CXR myself, worsening RLL infiltrate noted. Radiologic information dating back to May of 2015 reviewed, persistent bronchiectasis RLL significantly more than left. Discussed with PCCM-NP and bedside RN.  Pulmonary infiltrate: with evidence of bronchiectasis noted starting in may of 2015, suspect this is related to aspiration. - CT of the chest without contrast. - Pending that will decide on bronchoscopy. - Auto-immune work up as ordered. - No steroids.  Hypoxemia: due to infiltrate. - Titrate O2 for sat of 88-92%. - Will likely need qualification for home O2 prior to discharge.  DOE: due to infiltrate and likely aspiration. - Monitor sats.  Dizziness:  likely due to hypoxemia.  - See above. - Up only with assistance. - O2 supplementation.  Pneumonia: likely aspiration and has not resolved due to persistent aspiration. - Vanc/ceftaz. - Pan culture. - Consider bronch.  Aspiration: - Swallow evaluation as ordered. - Consider NPO until swallow evaluation but will defer to primary.  Smoking: - Smoking cessation.  COPD: - Dulera. - PRN albuterol as ordered.  Patient seen and examined, agree with above note. I dictated the care and orders written for this patient under my direction.  Rush Farmer, MD [phone]

## 2015-11-01 NOTE — ED Notes (Signed)
Pt states she became weak and dizzy and fell at work.  Pt fell on concrete and hit the back of her head and fell on her buttocks.  Pt states this is the second time she has fallen this week.  Pt states she has felt weak at times for the last month but hasn't seen her PCP.  Pt denies LOC.  Slight bruising to her right lower buttocks.  [place] BP 100/68, CBG 177, hr 78. 20 L hand.

## 2015-11-01 NOTE — Progress Notes (Signed)
New Admission Note:  Arrival Method: Ed stretcher Mental Orientation: Alert and Oriented X4 Telemetry: 5w17 NSR Assessment: Completed Skin: no issues IV: present L hand Pain: 8 hip pain called to get orders for pain medication Tubes:  none Safety Measures: Safety Fall Prevention Plan was given, discussed and signed. Admission: Completed [address] [place] Orientation: Patient has been orientated to the room, unit and the staff. Family: present and educated  Orders have been reviewed and implemented. Will continue to monitor the patient. Call light has been placed within reach and bed alarm has been activated.   Fabian Sharp, RN  Phone Number: [phone]

## 2015-11-01 NOTE — Progress Notes (Signed)
ANTIBIOTIC CONSULT NOTE - INITIAL  Pharmacy Consult for vancomycin + ceftazidime Indication: rule out pneumonia  Allergies  Allergen Reactions  . Ceclor [Cefaclor] Hives  . Escitalopram Oxalate     Pt does not recall ever taking medication  . Sertraline Hcl     UNKNOWN  . Sulfa Drugs Cross Reactors Hives and Rash    Hives and rash    Patient Measurements: Height: '5\' 3"'$  (160 cm) Weight: 130 lb (58.968 kg) IBW/kg (Calculated) : 52.4  Vital Signs: Temp: 98 F (36.7 C) (12/01 1100) Temp Source: Oral (12/01 1100) BP: 100/50 mmHg (12/01 1500) Pulse Rate: 67 (12/01 1500) Intake/Output from previous day:   Intake/Output from this shift:    Labs:  Recent Labs  11/01/15 1141  WBC 9.7  HGB 10.3*  PLT 183  CREATININE 0.49   Estimated Creatinine Clearance: 58 mL/min (by C-G formula based on Cr of 0.49). No results for input(s): VANCOTROUGH, VANCOPEAK, VANCORANDOM, GENTTROUGH, GENTPEAK, GENTRANDOM, TOBRATROUGH, TOBRAPEAK, TOBRARND, AMIKACINPEAK, AMIKACINTROU, AMIKACIN in the last 72 hours.   Microbiology: No results found for this or any previous visit (from the past 720 hour(s)).  Medical History: Past Medical History  Diagnosis Date  . Hypertension     essential hypertension  . Tobacco abuse     smoking about 1/2 pk of cigarettes a day  . Anxiety   . Depression     psychiatrist Dr. Toy Care  . Emphysematous COPD ([place])     changes in right base along with patchy areas on last x-ray  . COPD (chronic obstructive pulmonary disease) ([place])   . History of echocardiogram 12/24/2006    Est. EF of 22-29% NORMAL LV SYSTOLIC FUNCTION WITH IMPAIRED RELAXATION -- MILD AORTIC SCLEROSIS -- NORMAL PALONARY ARTERY PRESSURE -- NO OLD ECHOS FOR COMPARISON -- [name], MD  . History of cardiovascular stress test 08/15/2004    EF of 70% -- Normal stress cardiolite.  There is no evidence of ischemia and there is normal LV function. -- [name]. MD  . Diastolic dysfunction    . Allergy   . Asthma   . Heart murmur   . Pneumonia     "several times since May 2015" (07/06/2014)  . Chronic bronchitis ([place])     "get it alot; maybe not q yr" (07/06/2014)  . GERD (gastroesophageal reflux disease)     Medications:   (Not in a hospital admission) Assessment: 64 yo female admitted in 08/2015 with pneumonia and sepsis. Presenting now with dizziness, s/p fall, and productive cough. CT chest shows worsening infiltrate/pneumonia in right lower lobe and right middle lobe.  Cultures to be drawn. WBC 9.7, afebrile, CrCl ~60, BP 100/50  Goal of Therapy:  Vancomycin trough level 15-20 mcg/ml  Plan:  Ceftazidime 2 g q8h Vancomycin 1000 mg x1 (~17 mg/kg), then 500 mg IV q12 h Expected duration 7 days with resolution of temperature and/or normalization of WBC Measure antibiotic drug levels at steady state Follow up culture results  Governor Specking, PharmD Clinical Pharmacy Resident Pager: [phone] 11/01/2015,3:21 PM

## 2015-11-02 DIAGNOSIS — J189 Pneumonia, unspecified organism: Secondary | ICD-10-CM | POA: Insufficient documentation

## 2015-11-02 LAB — COMPREHENSIVE METABOLIC PANEL
ALT: 14 U/L (ref 14–54)
AST: 19 U/L (ref 15–41)
Albumin: 2 g/dL — ABNORMAL LOW (ref 3.5–5.0)
Alkaline Phosphatase: 90 U/L (ref 38–126)
Anion gap: 9 (ref 5–15)
BILIRUBIN TOTAL: 0.7 mg/dL (ref 0.3–1.2)
CHLORIDE: 82 mmol/L — AB (ref 101–111)
CO2: 37 mmol/L — ABNORMAL HIGH (ref 22–32)
CREATININE: 0.48 mg/dL (ref 0.44–1.00)
Calcium: 8.4 mg/dL — ABNORMAL LOW (ref 8.9–10.3)
Glucose, Bld: 98 mg/dL (ref 65–99)
Potassium: 2.7 mmol/L — CL (ref 3.5–5.1)
Sodium: 128 mmol/L — ABNORMAL LOW (ref 135–145)
TOTAL PROTEIN: 6.6 g/dL (ref 6.5–8.1)

## 2015-11-02 LAB — CBC
HCT: 33.8 % — ABNORMAL LOW (ref 36.0–46.0)
Hemoglobin: 11.1 g/dL — ABNORMAL LOW (ref 12.0–15.0)
MCH: 28.2 pg (ref 26.0–34.0)
MCHC: 32.8 g/dL (ref 30.0–36.0)
MCV: 86 fL (ref 78.0–100.0)
PLATELETS: 203 10*3/uL (ref 150–400)
RBC: 3.93 MIL/uL (ref 3.87–5.11)
RDW: 14.4 % (ref 11.5–15.5)
WBC: 8.1 10*3/uL (ref 4.0–10.5)

## 2015-11-02 LAB — HIV ANTIBODY (ROUTINE TESTING W REFLEX): HIV Screen 4th Generation wRfx: NONREACTIVE

## 2015-11-02 LAB — EXPECTORATED SPUTUM ASSESSMENT W REFEX TO RESP CULTURE

## 2015-11-02 LAB — RHEUMATOID FACTOR: Rhuematoid fact SerPl-aCnc: 12.4 IU/mL (ref 0.0–13.9)

## 2015-11-02 LAB — EXPECTORATED SPUTUM ASSESSMENT W GRAM STAIN, RFLX TO RESP C

## 2015-11-02 LAB — SEDIMENTATION RATE: Sed Rate: 74 mm/hr — ABNORMAL HIGH (ref 0–22)

## 2015-11-02 MED ORDER — BENZONATATE 100 MG PO CAPS
100.0000 mg | ORAL_CAPSULE | Freq: Three times a day (TID) | ORAL | Status: DC | PRN
  Administered 2015-11-02 – 2015-11-08 (×7): 100 mg via ORAL
  Filled 2015-11-02 (×7): qty 1

## 2015-11-02 MED ORDER — POTASSIUM CHLORIDE 10 MEQ/100ML IV SOLN
10.0000 meq | INTRAVENOUS | Status: AC
Start: 2015-11-02 — End: 2015-11-02
  Administered 2015-11-02 (×3): 10 meq via INTRAVENOUS
  Filled 2015-11-02 (×3): qty 100

## 2015-11-02 MED ORDER — BISACODYL 10 MG RE SUPP
10.0000 mg | Freq: Once | RECTAL | Status: AC
  Administered 2015-11-02: 10 mg via RECTAL
  Filled 2015-11-02: qty 1

## 2015-11-02 NOTE — Evaluation (Signed)
Clinical/Bedside Swallow Evaluation Patient Details  Name: Catherine Kelley MRN: [id number] Date of Birth: 11-17-1951  Today's Date: 11/02/2015 Time: SLP Start Time (ACUTE ONLY): 63 SLP Stop Time (ACUTE ONLY): 1440 SLP Time Calculation (min) (ACUTE ONLY): 14 min  Past Medical History:  Past Medical History  Diagnosis Date  . Hypertension     essential hypertension  . Tobacco abuse     smoking about 1/2 pk of cigarettes a day  . Anxiety   . Depression     psychiatrist Dr. Toy Care  . Emphysematous COPD ([place])     changes in right base along with patchy areas on last x-ray  . COPD (chronic obstructive pulmonary disease) ([place])   . History of echocardiogram 12/24/2006    Est. EF of 57-32% NORMAL LV SYSTOLIC FUNCTION WITH IMPAIRED RELAXATION -- MILD AORTIC SCLEROSIS -- NORMAL PALONARY ARTERY PRESSURE -- NO OLD ECHOS FOR COMPARISON -- [name], MD  . History of cardiovascular stress test 08/15/2004    EF of 70% -- Normal stress cardiolite.  There is no evidence of ischemia and there is normal LV function. -- [name]. MD  . Diastolic dysfunction   . Allergy   . Asthma   . Heart murmur   . Pneumonia     "several times since May 2015" (07/06/2014)  . Chronic bronchitis ([place])     "get it alot; maybe not q yr" (07/06/2014)  . GERD (gastroesophageal reflux disease)    Past Surgical History:  Past Surgical History  Procedure Laterality Date  . Tubal ligation  1986   HPI:  64 yo female smoker with hx COPD, RML/RLL bronchiectasis after a necrotizing PNA in 2015 with frequent bronchiectasis flares and ongoing outpt w/u of persistent RLL/RML infiltrate on CXR followed by Dr. Chase Caller. Presented to ER 12/1 with persistent dizziness and falling as well as worsening productive cough. Admitted by [facility] with ?HCAP v bronchiectasis flare. CXR with progressive infiltrates.    Assessment / Plan / Recommendation Clinical Impression  Pt has intermittent inhalation post swallow, although  only exhibits immediate coughing while consuming soft solids. This is observed in the setting of significantly prolonged mastication, which pt attributes to missing dentition. Suspect premature spillage of POs. Given pt's repeated h/o RLL PNA, signs of likely dysphagia at bedside, and inability to consecutively drink 3 oz of water without stopping to catch her breath, recommend MBS to objectively assess swallowing function. Will adjust diet to Dys 2 and thin liquids until that time.    Aspiration Risk  Moderate aspiration risk    Diet Recommendation  Dys 2 diet, thin liquids   Medication Administration: Whole meds with puree    Other  Recommendations Oral Care Recommendations: Oral care BID   Follow up Recommendations   (tba)    Frequency and Duration  tba pending MBS          Prognosis Prognosis for Safe Diet Advancement:  (tba)      Swallow Study   General HPI: 64 yo female smoker with hx COPD, RML/RLL bronchiectasis after a necrotizing PNA in 2015 with frequent bronchiectasis flares and ongoing outpt w/u of persistent RLL/RML infiltrate on CXR followed by Dr. Chase Caller. Presented to ER 12/1 with persistent dizziness and falling as well as worsening productive cough. Admitted by [facility] with ?HCAP v bronchiectasis flare. CXR with progressive infiltrates.  Type of Study: Bedside Swallow Evaluation Previous Swallow Assessment: none in chart Diet Prior to this Study: Regular;Thin liquids Temperature Spikes Noted: Yes (100.2) Respiratory Status:  Nasal cannula History of Recent Intubation: No Behavior/Cognition: Alert;Cooperative;Pleasant mood;Requires cueing Oral Cavity Assessment: Within Functional Limits Oral Care Completed by SLP: No Oral Cavity - Dentition: Missing dentition Vision: Functional for self-feeding Self-Feeding Abilities: Able to feed self Patient Positioning: Upright in bed Baseline Vocal Quality: Other (comment) (high pitched with vocal breaks)     Oral/Motor/Sensory Function Overall Oral Motor/Sensory Function: Within functional limits   Ice Chips Ice chips: Not tested   Thin Liquid Thin Liquid: Impaired Presentation: Cup;Self Fed;Straw Pharyngeal  Phase Impairments: Other (comments) (inhalation post-swallow)    Nectar Thick Nectar Thick Liquid: Not tested   Honey Thick Honey Thick Liquid: Not tested   Puree Puree: Within functional limits Presentation: Self Fed;Spoon   Solid Solid: Impaired Presentation: Self Fed Oral Phase Impairments: Impaired mastication Oral Phase Functional Implications: Impaired mastication Pharyngeal Phase Impairments: Suspected delayed Swallow;Cough - Immediate      [name], M.A. CCC-SLP [phone]  [name] 11/02/2015,3:00 PM

## 2015-11-02 NOTE — Clinical Documentation Improvement (Signed)
Internal Medicine  Nutritional Consult notes Severe Malnutrition in context of acute illness/injury at 10:54 AM this morning. No mention of same in Progress Note (2:41 PM) and/or Problem List. Please render an opinion and document findings in next progress note. Do not document in BPA drop down box.   Other Condition  Disagree with assessment  Clinically Undetermined  Supporting Information:  Weight loss and moderate depletions of muscle mass and body fat  Recommendations include: Ensure Enlive BID  Please exercise your independent, professional judgment when responding. A specific answer is not anticipated or expected.  Thank You, Zoila Shutter RN, [place] [phone]; Cell: [phone]

## 2015-11-02 NOTE — Progress Notes (Signed)
CRITICAL VALUE ALERT  Critical value received:  K 2.7  Date of notification:  12/2  Time of notification:  0845  Critical value read back:Yes.    Nurse who received alert:  Fabian Sharp, RN   MD notified (1st page):  [place]  Time of first page: 0840   MD notified (2nd page):  Time of second page:  Responding MD:  Darrick Meigs  Time MD responded:  [phone]

## 2015-11-02 NOTE — Progress Notes (Signed)
Initial Nutrition Assessment  DOCUMENTATION CODES:   Severe malnutrition in context of acute illness/injury  INTERVENTION:   -Ensure Enlive po BID, each supplement provides 350 kcal and 20 grams of protein  NUTRITION DIAGNOSIS:   Malnutrition related to acute illness as evidenced by percent weight loss, moderate depletions of muscle mass, moderate depletion of body fat.  GOAL:   Patient will meet greater than or equal to 90% of their needs  MONITOR:   PO intake, Supplement acceptance, Labs, Weight trends, Skin, I & O's  REASON FOR ASSESSMENT:   Malnutrition Screening Tool    ASSESSMENT:   Today patient comes to the hospital after she had a fall at home hitting her head on the concrete. Patient says that she has been dizzy for a while and dizziness become worse from sitting to standing and walking. She denies chest pain or shortness of breath. She has been feeling dizzy. Patient also complains of extreme thirst. She takes Lasix as well as hydrochlorothiazide at home.  Pt admitted rt middle lobe and lower lobe pneumonia, COPD.   Spoke with pt at bedside. She confirms poor appetite and weight loss over the past several weeks. She denies any difficulty chewing or swallowing foods, but complains about not having an appetite. She reports she consumed only her blueberry pie at dinner yesterday and ate only a few bites of breakfast this AM. PTA, she reports she consumed 2 meals per day, example of a typical meal would be a snack and cookie.   Pt reports UBW is around 145#. She feels like her clothes have been looser. Noted a 9.7% wt loss over the past 3 months, which is significant for time frame.   Discussed importance of good nutritional intake to promote healing. Pt has never tried nutritional supplements, but is amenable to Ensure.    Nutrition-Focused physical exam completed. Findings are mild to moderate fat depletion, mild to moderate muscle depletion, and no edema.   Labs  reviewed: Na: 128, K: 2.7.   Diet Order:  DIET DYS 2 Room service appropriate?: Yes; Fluid consistency:: Thin; Fluid restriction:: 1200 mL Fluid  Skin:  Reviewed, no issues  Last BM:  11/01/15  Height:   Ht Readings from Last 1 Encounters:  11/01/15 '5\' 3"'$  (1.6 m)    Weight:   Wt Readings from Last 1 Encounters:  11/01/15 130 lb (58.968 kg)    Ideal Body Weight:  52.3 kg  BMI:  Body mass index is 23.03 kg/(m^2).  Estimated Nutritional Needs:   Kcal:  1600-1800  Protein:  75-85 grams  Fluid:  1.6-1.8 L  EDUCATION NEEDS:   Education needs addressed  Teddrick Mallari A. Jimmye Norman, RD, LDN, CDE Pager: [phone] After hours Pager: [phone]

## 2015-11-02 NOTE — Care Management Note (Signed)
Case Management Note  Patient Details  Name: NEVAH DALAL MRN: [id number] Date of Birth: May 28, 1951  Subjective/Objective:                 Patient from home, lives with husband, admitted for hyponatremia and recurrent PNA.    Action/Plan:  CM will continue to follow and offer resources as needed.  Expected Discharge Date:                  Expected Discharge Plan:  Home/Self Care  In-House Referral:     Discharge planning Services  CM Consult  Post Acute Care Choice:    Choice offered to:     DME Arranged:    DME Agency:     HH Arranged:    HH Agency:     Status of Service:  In process, will continue to follow  Medicare Important Message Given:    Date Medicare IM Given:    Medicare IM give by:    Date Additional Medicare IM Given:    Additional Medicare Important Message give by:     If discussed at [place] of Stay Meetings, dates discussed:    Additional Comments:  Carles Collet, RN 11/02/2015, 3:57 PM

## 2015-11-02 NOTE — Progress Notes (Signed)
[facility] PROGRESS NOTE  Catherine Kelley BSJ:[id number] DOB: June 28, 1951 DOA: 11/01/2015 PCP: [name], MD  Assessment/Plan: Right middle lobe and lower lobe pneumonia Patient seen by pulmonary, CT chest without contrast ordered. Also note cultures AFB for MAI, fungal cultures autoimmune  Workup. Patient was diagnosed with pneumonia in March 2016 and has been treated multiple times with IV antibiotics. CT chest on 9/15 showed widespread airspace consolidation throat admitted and lower lobe. She was seen by pulmonary on 10/10/2015 and plan was to do FOB/BAL if no improvement in next 4 weeks. She has been started on Fortaz and vancomycin for healthcare associated pneumonia  Hyponatremia Patient sodium is 128, now improved likely from diuretics. Patient on Lasix and HCTZ at home which is currently on hold.  Fluid restriction 1200 mL per day. Follow BMP in a.m.  Hypokalemia Replace potassium KCl 10 mg IV 3. Follow BMP in a.m.  Status post fall Likely from electrolyte abnormalities as well as hypotension. Hold diuretics, IV hydration.  DVT prophylaxis Lovenox   Code Status: Full code Family Communication: *No family present at bedside Disposition Plan: Pending improvement in respiratory status   Consultants:  Pulmonary  Procedures:  None   Antibiotics:  Vancomycin  Ceftazidime  HPI/Subjective: 64 yr old female who  has a past medical history of Hypertension; Tobacco abuse; Anxiety; Depression; Emphysematous COPD ([place]); COPD (chronic obstructive pulmonary disease) ([place]); History of echocardiogram (12/24/2006); History of cardiovascular stress test (08/15/2004); Diastolic dysfunction; Allergy; Asthma; Heart murmur; Pneumonia; Chronic bronchitis ([place]); and GERD (gastroesophageal reflux disease). Today patient comes to the hospital after she had a fall at home hitting her head on the concrete. Patient says that she has been dizzy for a while and dizziness become worse from  sitting to standing and walking. She denies chest pain or shortness of breath. She has been feeling dizzy. Patient also complains of extreme thirst. She takes Lasix as well as hydrochlorothiazide at home.  Patient says breathing is better.  Objective: Filed Vitals:   11/02/15 0514 11/02/15 1312  BP: 107/47 103/44  Pulse: 84 81  Temp: 100.2 F (37.9 C) 98.9 F (37.2 C)  Resp: 18 18    Intake/Output Summary (Last 24 hours) at 11/02/15 1442 Last data filed at 11/02/15 1300  Gross per 24 hour  Intake    360 ml  Output    700 ml  Net   -340 ml   Filed Weights   11/01/15 1100  Weight: 58.968 kg (130 lb)    Exam:   General:  Appears in no acute distress  Cardiovascular: S1S2 RRR  Respiratory: decreased breath sounds at lung bases  Abdomen: Soft, nontender  Musculoskeletal: No cyanosis/clubbing/edema of the lower extremities  Data Reviewed: Basic Metabolic Panel:  Recent Labs Lab 11/01/15 1141 11/01/15 1859 11/02/15 0615  NA 124*  --  128*  K 2.1*  --  2.7*  CL 77*  --  82*  CO2 39*  --  37*  GLUCOSE 124*  --  98  BUN 6  --  <5*  CREATININE 0.49  --  0.48  CALCIUM 8.2*  --  8.4*  MG  --  1.9  --    Liver Function Tests:  Recent Labs Lab 11/02/15 0615  AST 19  ALT 14  ALKPHOS 90  BILITOT 0.7  PROT 6.6  ALBUMIN 2.0*   No results for input(s): LIPASE, AMYLASE in the last 168 hours. No results for input(s): AMMONIA in the last 168 hours. CBC:  Recent Labs Lab  11/01/15 1141 11/02/15 0615  WBC 9.7 8.1  NEUTROABS 7.6  --   HGB 10.3* 11.1*  HCT 30.6* 33.8*  MCV 84.1 86.0  PLT 183 203   Cardiac Enzymes: No results for input(s): CKTOTAL, CKMB, CKMBINDEX, TROPONINI in the last 168 hours. BNP (last 3 results) No results for input(s): BNP in the last 8760 hours.  ProBNP (last 3 results) No results for input(s): PROBNP in the last 8760 hours.  CBG: No results for input(s): GLUCAP in the last 168 hours.  No results found for this or any  previous visit (from the past 240 hour(s)).   Studies: Dg Chest 2 View  11/01/2015  CLINICAL DATA:  Weakness, dizziness at work, fall, cough EXAM: CHEST  2 VIEW COMPARISON:  09/27/2015 FINDINGS: Worsening infiltrate/ pneumonia in right lower lobe and middle lobe. Cardiomediastinal silhouette is stable. Left lung is clear. IMPRESSION: Worsening infiltrate/pneumonia in right lower lobe and right middle lobe. Follow-up to resolution after appropriate treatment is recommended. Electronically Signed   By: Lahoma Crocker M.D.   On: 11/01/2015 13:11   Ct Head Wo Contrast  11/01/2015  CLINICAL DATA:  Pt fell and hit back of head and buttocks. Dizzy. States this is the second time she has fallen this week. Feels weak. Also incontinent EXAM: CT HEAD WITHOUT CONTRAST TECHNIQUE: Contiguous axial images were obtained from the base of the skull through the vertex without intravenous contrast. COMPARISON:  07/08/2015 FINDINGS: There is central and cortical atrophy. There is no intra or extra-axial fluid collection or mass lesion. The basilar cisterns and ventricles have a normal appearance. There is no CT evidence for acute infarction or hemorrhage. Bone windows demonstrate significant opacification of the left maxillary sinus, similar in appearance to the previous exam. There is improved aeration of the right maxillary sinus although there is persistent mucoperiosteal thickening. No air-fluid levels are identified. No calvarial fracture. The mastoid air cells are normally aerated. IMPRESSION: 1.  No evidence for acute intracranial abnormality. 2. Chronic sinus disease. Electronically Signed   By: Nolon Nations M.D.   On: 11/01/2015 13:42   Ct Chest Wo Contrast  11/01/2015  CLINICAL DATA:  64 year old female with history of pneumonia. Cough for the past several months. Originally diagnosed with right lower lobe pneumonia in May 2015. EXAM: CT CHEST WITHOUT CONTRAST TECHNIQUE: Multidetector CT imaging of the chest was  performed following the standard protocol without IV contrast. COMPARISON:  Chest CT 08/16/2015. FINDINGS: Mediastinum/Lymph Nodes: Heart size is borderline enlarged. There is no significant pericardial fluid, thickening or pericardial calcification. There is atherosclerosis of the thoracic aorta, the great vessels of the mediastinum and the coronary arteries, including calcified atherosclerotic plaque in the left main, left anterior descending, left circumflex and right coronary arteries. Multiple enlarged mediastinal lymph nodes in the right peritracheal nodal station measuring up to 13 mm in short axis. Enlarged subcarinal nodes measuring up to 16 mm in short axis. Probable right hilar lymphadenopathy which cannot be measured on today's noncontrast CT examination. Esophagus is unremarkable in appearance. No axillary lymphadenopathy. Lungs/Pleura: There is severe cylindrical and cystic bronchiectasis throughout the right middle and lower lobes. Several large nodular and mass-like areas of chronic consolidation are again noted throughout the right lower lobe. Thick-walled cavitary lesion in the right lower lobe again noted, currently measuring approximately 3.5 x 2.6 cm (image 35 of series 205). Extensive thickening of the peribronchovascular interstitium throughout the right middle and lower lobe. Less severe generalized bronchial wall thickening throughout the lungs bilaterally with mild  centrilobular and paraseptal emphysema. Overall, compared to prior study from 08/16/2015, the severity of bronchiectasis has increased in the right lung, while the patchy multifocal airspace consolidation has slightly decreased throughout the right lung. No new airspace consolidation in the left lung. Mild linear scarring in the left lower lobe. Upper Abdomen: Atherosclerosis. Musculoskeletal/Soft Tissues: There are no aggressive appearing lytic or blastic lesions noted in the visualized portions of the skeleton. IMPRESSION: 1.  Chronic complications of necrotizing pneumonia in the right lower lobe with areas of chronic airspace consolidation throughout the right middle and lower lobes demonstrating some mild improvement compared to prior studies, but with worsening areas of varicose and cystic bronchiectasis throughout this same distribution, multiple chronic nodular and mass-like appearing areas of consolidation, and chronic thick-walled cavitary area in the right lower lobe, as above. Given of these findings since the persistence May 2015, thoracic surgical consultation for right middle and right lower lobectomy might be considered, as these areas of chronic infection are unlikely to clear given the severity bronchiectasis throughout these regions on today's examination. At this time, there does not appear to be infectious involvement of the right upper lobe, left lower lobe or left upper lobe. 2. Mild generalized bronchial wall thickening and mild centrilobular and paraseptal emphysema; imaging findings compatible with the reported clinical history of COPD. 3. Atherosclerosis, including 3 vessel coronary artery disease. Please note that although the presence of coronary artery calcium documents the presence of coronary artery disease, the severity of this disease and any potential stenosis cannot be assessed on this non-gated CT examination. Assessment for potential risk factor modification, dietary therapy or pharmacologic therapy may be warranted, if clinically indicated. These results were called by telephone at the time of interpretation on 11/01/2015 at 6:01 pm to Dr. Melvyn Novas, who verbally acknowledged these results. Electronically Signed   By: Vinnie Langton M.D.   On: 11/01/2015 18:03   Dg Hips Bilat With Pelvis 3-4 Views  11/01/2015  CLINICAL DATA:  Fall EXAM: DG HIP (WITH OR WITHOUT PELVIS) 3-4V BILAT COMPARISON:  None. FINDINGS: There is no evidence of hip fracture or dislocation. There is no evidence of arthropathy or other  focal bone abnormality. IMPRESSION: Negative. Electronically Signed   By: Franchot Gallo M.D.   On: 11/01/2015 13:12    Scheduled Meds: . ALPRAZolam  0.5 mg Oral QHS  . antiseptic oral rinse  7 mL Mouth Rinse BID  . cefTAZidime (FORTAZ)  IV  2 g Intravenous Q8H  . divalproex  500 mg Oral QHS  . enoxaparin (LOVENOX) injection  40 mg Subcutaneous Q24H  . feeding supplement (ENSURE ENLIVE)  237 mL Oral BID BM  . guaiFENesin  600 mg Oral BID  . Influenza vac split quadrivalent PF  0.5 mL Intramuscular Tomorrow-1000  . mometasone-formoterol  2 puff Inhalation BID  . nortriptyline  100 mg Oral QHS  . vancomycin  500 mg Intravenous Q12H   Continuous Infusions: . sodium chloride 100 mL/hr at 11/02/15 1313    Active Problems:   Hyponatremia   Pneumonia    Time spent: 25 min    [facility] Pager [phone] If 7PM-7AM, please contact night-coverage at [email or web address], password [facility] 11/02/2015, 2:42 PM  LOS: 1 day

## 2015-11-02 NOTE — Consult Note (Addendum)
Name: Catherine Kelley MRN: [id number] DOB: 10/25/1951    ADMISSION DATE:  11/01/2015 CONSULTATION DATE:  12/1  REFERRING MD :  Darrick Meigs ([facility])   CHIEF COMPLAINT:  PNA  BRIEF PATIENT DESCRIPTION:  64yo female smoker with hx COPD, RML/RLL bronchiectasis after a necrotizing PNA in 2015 with frequent bronchiectasis flares and ongoing outpt w/u of persistent RLL/RML infiltrate on CXR followed by Dr. Chase Caller.  Presented to ER 112/1 with persistent dizziness and falling as well as worsening productive cough.  Admitted by [facility] with ?HCAP v bronchiectasis flare.  CXR with progressive infiltrates and PCCM consulted.      SIGNIFICANT EVENTS    STUDIES:  CT head 12/1>>> neg acute, chronic sinus disease   HISTORY OF PRESENT ILLNESS:  64yo female smoker with hx COPD, RML/RLL bronchiectasis after a necrotizing PNA in 2015 with frequent bronchiectasis flares and ongoing outpt w/u of persistent RLL/RML infiltrate on CXR followed by Dr. Chase Caller.  Presented to ER 112/1 with persistent dizziness and falling as well as worsening productive cough.  Admitted by [facility] with ?HCAP v bronchiectasis flare.  CXR with progressive infiltrates and PCCM consulted.    Currently c/o hip pain and intermittent dizziness, some mild SOB but not above baseline.  Has ongoing productive cough with thick, yellow sputum.  Also c/o extreme thirst and dry mouth.  Denies chest pain, hemoptysis, weight loss, leg/calf pain, orthopnea, fevers, chills, night sweats.   PAST MEDICAL HISTORY :   has a past medical history of Hypertension; Tobacco abuse; Anxiety; Depression; Emphysematous COPD ([place]); COPD (chronic obstructive pulmonary disease) ([place]); History of echocardiogram (12/24/2006); History of cardiovascular stress test (08/15/2004); Diastolic dysfunction; Allergy; Asthma; Heart murmur; Pneumonia; Chronic bronchitis ([place]); and GERD (gastroesophageal reflux disease).  has past surgical history that includes Tubal ligation (1986). Prior to  Admission medications   Medication Sig Start Date End Date Taking? Authorizing Provider  ALPRAZolam Duanne Moron) 0.5 MG tablet Take 0.5 mg by mouth at bedtime.    Yes Historical Provider, MD  Calcium Carbonate-Vitamin D (CALCIUM-VITAMIN D) 500-200 MG-UNIT per tablet Take 1 tablet by mouth daily.   Yes Historical Provider, MD  divalproex (DEPAKOTE ER) 500 MG 24 hr tablet Take 500 mg by mouth at bedtime.  02/22/12  Yes Historical Provider, MD  fluticasone (FLONASE) 50 MCG/ACT nasal spray Place 2 sprays into both nostrils daily. Patient taking differently: Place 2 sprays into both nostrils daily as needed for allergies.  06/06/15  Yes Brand Males, MD  furosemide (LASIX) 20 MG tablet TAKE 2 TABLETS BY MOUTH EVERY DAY 05/25/15  Yes [name], MD  HYDROcodone-acetaminophen (NORCO/VICODIN) 5-325 MG tablet Take 1 tablet by mouth every 4 (four) hours as needed for moderate pain (or cough). 09/27/15  Yes Tanda Rockers, MD  losartan-hydrochlorothiazide (HYZAAR) 50-12.5 MG tablet Take 0.5 tablets by mouth daily. 09/28/15  Yes Historical Provider, MD  mometasone-formoterol (DULERA) 100-5 MCG/ACT AERO Take 2 puffs first thing in am and then another 2 puffs about 12 hours later. Patient taking differently: Inhale 2 puffs into the lungs 2 (two) times daily. Take 2 puffs first thing in am and then another 2 puffs about 12 hours later. 09/27/15  Yes Tanda Rockers, MD  Multiple Vitamin (MULTIVITAMIN) tablet Take 1 tablet by mouth daily.   Yes Historical Provider, MD  nadolol (CORGARD) 40 MG tablet Take 20 mg by mouth daily.  09/28/15  Yes Historical Provider, MD  nortriptyline (PAMELOR) 50 MG capsule Take 100 mg by mouth at bedtime.   Yes Historical Provider,  MD  tiZANidine (ZANAFLEX) 4 MG tablet Take 1 tablet (4 mg total) by mouth every 6 (six) hours as needed for muscle spasms. 09/13/15  Yes Tishira R Brewington, PA-C  amoxicillin-clavulanate (AUGMENTIN) 875-125 MG tablet Take 1 tablet by mouth daily. 09/29/15    Historical Provider, MD  benzonatate (TESSALON) 100 MG capsule TAKE 1 TO 2 CAPSULES BY MOUTH 3 TIMES DAILY AS NEEDED FOR COUGH Patient not taking: Reported on 11/01/2015 10/03/15   Wardell Honour, MD  ipratropium (ATROVENT) 0.03 % nasal spray Place 2 sprays into both nostrils 2 (two) times daily. 07/16/15   Historical Provider, MD  saccharomyces boulardii (FLORASTOR) 250 MG capsule Take 1 capsule (250 mg total) by mouth 2 (two) times daily. Patient not taking: Reported on 11/01/2015 08/21/15   Charlynne Cousins, MD   Allergies  Allergen Reactions  . Ceclor [Cefaclor] Hives  . Escitalopram Oxalate     Pt does not recall ever taking medication  . Sertraline Hcl     UNKNOWN  . Sulfa Drugs Cross Reactors Hives and Rash    Hives and rash    FAMILY HISTORY:  family history includes Asthma in her maternal grandmother; Breast cancer in her maternal aunt; Heart disease in her father; Hyperlipidemia in her father; Hypertension in her father; Stroke in her mother. SOCIAL HISTORY:  reports that she quit smoking about 2 weeks ago. Her smoking use included Cigarettes. She has a 42 pack-year smoking history. She has never used smokeless tobacco. She reports that she drinks alcohol. She reports that she does not use illicit drugs.  REVIEW OF SYSTEMS:   As per HPI - All other systems reviewed and were neg.   SUBJECTIVE:   VITAL SIGNS: Temp:  [97.9 F (36.6 C)-100.2 F (37.9 C)] 100.2 F (37.9 C) (12/02 0514) Pulse Rate:  [66-84] 84 (12/02 0514) Resp:  [13-20] 18 (12/02 0514) BP: (88-125)/(45-95) 107/47 mmHg (12/02 0514) SpO2:  [90 %-100 %] 93 % (12/02 0940) Weight:  [130 lb (58.968 kg)] 130 lb (58.968 kg) (12/01 1100)  PHYSICAL EXAMINATION:  General:  Chronically ill appearing female, NAD in ER  Neuro:  Awake, alert, appropriate, MAE  HEENT:  Mm dry, no JVD  Cardiovascular:  s1s2 rrr Lungs:  resps even non labored on [place], RLL rhonchus, otherwise ess clear  Abdomen:  Round, soft, +bs    Musculoskeletal:  Warm and dry, no edema   Recent Labs Lab 11/01/15 1141 11/02/15 0615  NA 124* 128*  K 2.1* 2.7*  CL 77* 82*  CO2 39* 37*  BUN 6 <5*  CREATININE 0.49 0.48  GLUCOSE 124* 98    Recent Labs Lab 11/01/15 1141 11/02/15 0615  HGB 10.3* 11.1*  HCT 30.6* 33.8*  WBC 9.7 8.1  PLT 183 203   Dg Chest 2 View  11/01/2015  CLINICAL DATA:  Weakness, dizziness at work, fall, cough EXAM: CHEST  2 VIEW COMPARISON:  09/27/2015 FINDINGS: Worsening infiltrate/ pneumonia in right lower lobe and middle lobe. Cardiomediastinal silhouette is stable. Left lung is clear. IMPRESSION: Worsening infiltrate/pneumonia in right lower lobe and right middle lobe. Follow-up to resolution after appropriate treatment is recommended. Electronically Signed   By: Lahoma Crocker M.D.   On: 11/01/2015 13:11   Ct Head Wo Contrast  11/01/2015  CLINICAL DATA:  Pt fell and hit back of head and buttocks. Dizzy. States this is the second time she has fallen this week. Feels weak. Also incontinent EXAM: CT HEAD WITHOUT CONTRAST TECHNIQUE: Contiguous axial images were obtained from  the base of the skull through the vertex without intravenous contrast. COMPARISON:  07/08/2015 FINDINGS: There is central and cortical atrophy. There is no intra or extra-axial fluid collection or mass lesion. The basilar cisterns and ventricles have a normal appearance. There is no CT evidence for acute infarction or hemorrhage. Bone windows demonstrate significant opacification of the left maxillary sinus, similar in appearance to the previous exam. There is improved aeration of the right maxillary sinus although there is persistent mucoperiosteal thickening. No air-fluid levels are identified. No calvarial fracture. The mastoid air cells are normally aerated. IMPRESSION: 1.  No evidence for acute intracranial abnormality. 2. Chronic sinus disease. Electronically Signed   By: Nolon Nations M.D.   On: 11/01/2015 13:42   Ct Chest Wo  Contrast  11/01/2015  CLINICAL DATA:  64 year old female with history of pneumonia. Cough for the past several months. Originally diagnosed with right lower lobe pneumonia in May 2015. EXAM: CT CHEST WITHOUT CONTRAST TECHNIQUE: Multidetector CT imaging of the chest was performed following the standard protocol without IV contrast. COMPARISON:  Chest CT 08/16/2015. FINDINGS: Mediastinum/Lymph Nodes: Heart size is borderline enlarged. There is no significant pericardial fluid, thickening or pericardial calcification. There is atherosclerosis of the thoracic aorta, the great vessels of the mediastinum and the coronary arteries, including calcified atherosclerotic plaque in the left main, left anterior descending, left circumflex and right coronary arteries. Multiple enlarged mediastinal lymph nodes in the right peritracheal nodal station measuring up to 13 mm in short axis. Enlarged subcarinal nodes measuring up to 16 mm in short axis. Probable right hilar lymphadenopathy which cannot be measured on today's noncontrast CT examination. Esophagus is unremarkable in appearance. No axillary lymphadenopathy. Lungs/Pleura: There is severe cylindrical and cystic bronchiectasis throughout the right middle and lower lobes. Several large nodular and mass-like areas of chronic consolidation are again noted throughout the right lower lobe. Thick-walled cavitary lesion in the right lower lobe again noted, currently measuring approximately 3.5 x 2.6 cm (image 35 of series 205). Extensive thickening of the peribronchovascular interstitium throughout the right middle and lower lobe. Less severe generalized bronchial wall thickening throughout the lungs bilaterally with mild centrilobular and paraseptal emphysema. Overall, compared to prior study from 08/16/2015, the severity of bronchiectasis has increased in the right lung, while the patchy multifocal airspace consolidation has slightly decreased throughout the right lung. No new  airspace consolidation in the left lung. Mild linear scarring in the left lower lobe. Upper Abdomen: Atherosclerosis. Musculoskeletal/Soft Tissues: There are no aggressive appearing lytic or blastic lesions noted in the visualized portions of the skeleton. IMPRESSION: 1. Chronic complications of necrotizing pneumonia in the right lower lobe with areas of chronic airspace consolidation throughout the right middle and lower lobes demonstrating some mild improvement compared to prior studies, but with worsening areas of varicose and cystic bronchiectasis throughout this same distribution, multiple chronic nodular and mass-like appearing areas of consolidation, and chronic thick-walled cavitary area in the right lower lobe, as above. Given of these findings since the persistence May 2015, thoracic surgical consultation for right middle and right lower lobectomy might be considered, as these areas of chronic infection are unlikely to clear given the severity bronchiectasis throughout these regions on today's examination. At this time, there does not appear to be infectious involvement of the right upper lobe, left lower lobe or left upper lobe. 2. Mild generalized bronchial wall thickening and mild centrilobular and paraseptal emphysema; imaging findings compatible with the reported clinical history of COPD. 3. Atherosclerosis, including 3 vessel  coronary artery disease. Please note that although the presence of coronary artery calcium documents the presence of coronary artery disease, the severity of this disease and any potential stenosis cannot be assessed on this non-gated CT examination. Assessment for potential risk factor modification, dietary therapy or pharmacologic therapy may be warranted, if clinically indicated. These results were called by telephone at the time of interpretation on 11/01/2015 at 6:01 pm to Dr. Melvyn Novas, who verbally acknowledged these results. Electronically Signed   By: Vinnie Langton M.D.    On: 11/01/2015 18:03   Dg Hips Bilat With Pelvis 3-4 Views  11/01/2015  CLINICAL DATA:  Fall EXAM: DG HIP (WITH OR WITHOUT PELVIS) 3-4V BILAT COMPARISON:  None. FINDINGS: There is no evidence of hip fracture or dislocation. There is no evidence of arthropathy or other focal bone abnormality. IMPRESSION: Negative. Electronically Signed   By: Franchot Gallo M.D.   On: 11/01/2015 13:12    ASSESSMENT / PLAN: COPD - without acute exacerbation  Recurrent PNA -- recurrent/worsening RML/RLL infiltrate.  ?aspiration.  Severe RML/RLL bronchiectasis   I reviewed her CT scan and history. I doubt she has an ILD. She however has severe structural damage to the right lower lobe with a cavitary lesion and significant bronchiectasis likely from the necrotizing pneumonia in 2015. I suspect she is having recurrent pneumonias because of this and inability to drain secretions. We need to evaluate for other atypical organisms such as MAI or fungal infections given the nodular infiltrates in previous scans. I'm not sure a bronchoscopy would be of additional help as she is making copious amounts of purulent sputum. However we can consider it if the sputum cultures are unrevealing. She may need a CT surgery consult for right lower lobe resection. There is no need for steroids at this point  Recommendations: - CT chest without contrast now. - Sputum cultures, AFB for MAI, fungal cultures - Cont IV abx as per primary to cover HCAP  - Autoimmune workup pending- ANA, ANCA, HIV, ESR, RF. - Consider bronchoscopy or CT surgery eval depending on the above workup.  Marshell Garfinkel MD [facility] Pager [phone] If no answer or after 3pm call: [phone] 11/02/2015, 10:02 AM

## 2015-11-03 ENCOUNTER — Inpatient Hospital Stay (HOSPITAL_COMMUNITY): Payer: Managed Care, Other (non HMO)

## 2015-11-03 DIAGNOSIS — E43 Unspecified severe protein-calorie malnutrition: Secondary | ICD-10-CM | POA: Insufficient documentation

## 2015-11-03 DIAGNOSIS — J439 Emphysema, unspecified: Secondary | ICD-10-CM | POA: Insufficient documentation

## 2015-11-03 DIAGNOSIS — J471 Bronchiectasis with (acute) exacerbation: Secondary | ICD-10-CM

## 2015-11-03 DIAGNOSIS — E440 Moderate protein-calorie malnutrition: Secondary | ICD-10-CM | POA: Insufficient documentation

## 2015-11-03 LAB — BASIC METABOLIC PANEL
Anion gap: 6 (ref 5–15)
CO2: 33 mmol/L — ABNORMAL HIGH (ref 22–32)
CREATININE: 0.39 mg/dL — AB (ref 0.44–1.00)
Calcium: 8.2 mg/dL — ABNORMAL LOW (ref 8.9–10.3)
Chloride: 92 mmol/L — ABNORMAL LOW (ref 101–111)
Glucose, Bld: 99 mg/dL (ref 65–99)
POTASSIUM: 3 mmol/L — AB (ref 3.5–5.1)
SODIUM: 131 mmol/L — AB (ref 135–145)

## 2015-11-03 LAB — HIV ANTIBODY (ROUTINE TESTING W REFLEX): HIV Screen 4th Generation wRfx: NONREACTIVE

## 2015-11-03 LAB — MAGNESIUM: MAGNESIUM: 1.7 mg/dL (ref 1.7–2.4)

## 2015-11-03 LAB — RHEUMATOID FACTOR: RHEUMATOID FACTOR: 10.2 [IU]/mL (ref 0.0–13.9)

## 2015-11-03 LAB — VANCOMYCIN, TROUGH

## 2015-11-03 MED ORDER — POTASSIUM CHLORIDE 10 MEQ/100ML IV SOLN
10.0000 meq | INTRAVENOUS | Status: AC
  Administered 2015-11-03 (×3): 10 meq via INTRAVENOUS
  Filled 2015-11-03 (×3): qty 100

## 2015-11-03 MED ORDER — VANCOMYCIN HCL IN DEXTROSE 750-5 MG/150ML-% IV SOLN
750.0000 mg | Freq: Two times a day (BID) | INTRAVENOUS | Status: DC
  Administered 2015-11-04 – 2015-11-07 (×7): 750 mg via INTRAVENOUS
  Filled 2015-11-03 (×8): qty 150

## 2015-11-03 MED ORDER — VANCOMYCIN HCL IN DEXTROSE 1-5 GM/200ML-% IV SOLN
1000.0000 mg | Freq: Once | INTRAVENOUS | Status: AC
  Administered 2015-11-03: 1000 mg via INTRAVENOUS
  Filled 2015-11-03: qty 200

## 2015-11-03 MED ORDER — POLYETHYLENE GLYCOL 3350 17 G PO PACK
17.0000 g | PACK | Freq: Every day | ORAL | Status: DC
  Administered 2015-11-03 – 2015-11-08 (×6): 17 g via ORAL
  Filled 2015-11-03 (×6): qty 1

## 2015-11-03 NOTE — Progress Notes (Signed)
[facility] PROGRESS NOTE  KERIGAN NARVAEZ XNA:[id number] DOB: [birth year] DOA: 11/01/2015 PCP: [name], MD  Assessment/Plan: Right middle lobe and lower lobe pneumonia Patient seen by pulmonary, CT chest without contrast ordered. Also note cultures AFB for MAI, fungal cultures autoimmune  Workup. Patient was diagnosed with pneumonia in March 2016 and has been treated multiple times with IV antibiotics. CT chest on 9/15 showed widespread airspace consolidation throat admitted and lower lobe. She was seen by pulmonary on 10/10/2015 and plan was to do FOB/BAL if no improvement in next 4 weeks. She has been started on Fortaz and vancomycin for healthcare associated pneumonia  Hyponatremia Patient sodium is 131, now improved. Patient on Lasix and HCTZ at home which is currently on hold.  Fluid restriction 1200 mL per day. Follow BMP in a.m.  Hypokalemia Patient continues to have hypokalemia, will check serum magnesium Replace potassium KCl 10 mg IV 3. Follow BMP in a.m.  Status post fall Likely from electrolyte abnormalities as well as hypotension. Hold diuretics, IV hydration.  DVT prophylaxis Lovenox   Code Status: Full code Family Communication: *No family present at bedside Disposition Plan: Pending improvement in respiratory status   Consultants:  Pulmonary  Procedures:  None   Antibiotics:  Vancomycin  Ceftazidime  HPI/Subjective: 64 yr old female who  has a past medical history of Hypertension; Tobacco abuse; Anxiety; Depression; Emphysematous COPD ([place]); COPD (chronic obstructive pulmonary disease) ([place]); History of echocardiogram (12/24/2006); History of cardiovascular stress test (08/15/2004); Diastolic dysfunction; Allergy; Asthma; Heart murmur; Pneumonia; Chronic bronchitis ([place]); and GERD (gastroesophageal reflux disease). Today patient comes to the hospital after she had a fall at home hitting her head on the concrete. Patient says that she has been dizzy  for a while and dizziness become worse from sitting to standing and walking. She denies chest pain or shortness of breath. She has been feeling dizzy. Patient also complains of extreme thirst. She takes Lasix as well as hydrochlorothiazide at home.  Patient says breathing is same, no change in symptoms.  Objective: Filed Vitals:   11/03/15 0623 11/03/15 1346  BP: 119/51 117/40  Pulse: 80 80  Temp: 97.9 F (36.6 C) 98.5 F (36.9 C)  Resp: 18 19    Intake/Output Summary (Last 24 hours) at 11/03/15 1454 Last data filed at 11/03/15 1347  Gross per 24 hour  Intake 4591.66 ml  Output      0 ml  Net 4591.66 ml   Filed Weights   11/01/15 1100  Weight: 58.968 kg (130 lb)    Exam:   General:  Appears in no acute distress  Cardiovascular: S1S2 RRR  Respiratory: decreased breath sounds at lung bases  Abdomen: Soft, nontender  Musculoskeletal: No cyanosis/clubbing/edema of the lower extremities  Data Reviewed: Basic Metabolic Panel:  Recent Labs Lab 11/01/15 1141 11/01/15 1859 11/02/15 0615 11/03/15 0439  NA 124*  --  128* 131*  K 2.1*  --  2.7* 3.0*  CL 77*  --  82* 92*  CO2 39*  --  37* 33*  GLUCOSE 124*  --  98 99  BUN 6  --  <5* <5*  CREATININE 0.49  --  0.48 0.39*  CALCIUM 8.2*  --  8.4* 8.2*  MG  --  1.9  --   --    Liver Function Tests:  Recent Labs Lab 11/02/15 0615  AST 19  ALT 14  ALKPHOS 90  BILITOT 0.7  PROT 6.6  ALBUMIN 2.0*   No results for input(s): LIPASE, AMYLASE  in the last 168 hours. No results for input(s): AMMONIA in the last 168 hours. CBC:  Recent Labs Lab 11/01/15 1141 11/02/15 0615  WBC 9.7 8.1  NEUTROABS 7.6  --   HGB 10.3* 11.1*  HCT 30.6* 33.8*  MCV 84.1 86.0  PLT 183 203   Cardiac Enzymes: No results for input(s): CKTOTAL, CKMB, CKMBINDEX, TROPONINI in the last 168 hours. BNP (last 3 results) No results for input(s): BNP in the last 8760 hours.  ProBNP (last 3 results) No results for input(s): PROBNP in the  last 8760 hours.  CBG: No results for input(s): GLUCAP in the last 168 hours.  Recent Results (from the past 240 hour(s))  Culture, expectorated sputum-assessment     Status: None   Collection Time: 11/02/15  1:30 PM  Result Value Ref Range Status   Specimen Description SPUTUM  Final   Special Requests NONE  Final   Sputum evaluation   Final    THIS SPECIMEN IS ACCEPTABLE. RESPIRATORY CULTURE REPORT TO FOLLOW.   Report Status 11/02/2015 FINAL  Final     Studies: Ct Chest Wo Contrast  11/01/2015  CLINICAL DATA:  64 year old female with history of pneumonia. Cough for the past several months. Originally diagnosed with right lower lobe pneumonia in May 2015. EXAM: CT CHEST WITHOUT CONTRAST TECHNIQUE: Multidetector CT imaging of the chest was performed following the standard protocol without IV contrast. COMPARISON:  Chest CT 08/16/2015. FINDINGS: Mediastinum/Lymph Nodes: Heart size is borderline enlarged. There is no significant pericardial fluid, thickening or pericardial calcification. There is atherosclerosis of the thoracic aorta, the great vessels of the mediastinum and the coronary arteries, including calcified atherosclerotic plaque in the left main, left anterior descending, left circumflex and right coronary arteries. Multiple enlarged mediastinal lymph nodes in the right peritracheal nodal station measuring up to 13 mm in short axis. Enlarged subcarinal nodes measuring up to 16 mm in short axis. Probable right hilar lymphadenopathy which cannot be measured on today's noncontrast CT examination. Esophagus is unremarkable in appearance. No axillary lymphadenopathy. Lungs/Pleura: There is severe cylindrical and cystic bronchiectasis throughout the right middle and lower lobes. Several large nodular and mass-like areas of chronic consolidation are again noted throughout the right lower lobe. Thick-walled cavitary lesion in the right lower lobe again noted, currently measuring approximately 3.5 x  2.6 cm (image 35 of series 205). Extensive thickening of the peribronchovascular interstitium throughout the right middle and lower lobe. Less severe generalized bronchial wall thickening throughout the lungs bilaterally with mild centrilobular and paraseptal emphysema. Overall, compared to prior study from 08/16/2015, the severity of bronchiectasis has increased in the right lung, while the patchy multifocal airspace consolidation has slightly decreased throughout the right lung. No new airspace consolidation in the left lung. Mild linear scarring in the left lower lobe. Upper Abdomen: Atherosclerosis. Musculoskeletal/Soft Tissues: There are no aggressive appearing lytic or blastic lesions noted in the visualized portions of the skeleton. IMPRESSION: 1. Chronic complications of necrotizing pneumonia in the right lower lobe with areas of chronic airspace consolidation throughout the right middle and lower lobes demonstrating some mild improvement compared to prior studies, but with worsening areas of varicose and cystic bronchiectasis throughout this same distribution, multiple chronic nodular and mass-like appearing areas of consolidation, and chronic thick-walled cavitary area in the right lower lobe, as above. Given of these findings since the persistence May 2015, thoracic surgical consultation for right middle and right lower lobectomy might be considered, as these areas of chronic infection are unlikely to clear given  the severity bronchiectasis throughout these regions on today's examination. At this time, there does not appear to be infectious involvement of the right upper lobe, left lower lobe or left upper lobe. 2. Mild generalized bronchial wall thickening and mild centrilobular and paraseptal emphysema; imaging findings compatible with the reported clinical history of COPD. 3. Atherosclerosis, including 3 vessel coronary artery disease. Please note that although the presence of coronary artery calcium  documents the presence of coronary artery disease, the severity of this disease and any potential stenosis cannot be assessed on this non-gated CT examination. Assessment for potential risk factor modification, dietary therapy or pharmacologic therapy may be warranted, if clinically indicated. These results were called by telephone at the time of interpretation on 11/01/2015 at 6:01 pm to Dr. Melvyn Novas, who verbally acknowledged these results. Electronically Signed   By: Vinnie Langton M.D.   On: 11/01/2015 18:03   Ct Chest High Resolution  11/03/2015  CLINICAL DATA:  64 year old female inpatient with chronic bronchiectasis and persistent productive cough. EXAM: CT CHEST WITHOUT CONTRAST TECHNIQUE: Multidetector CT imaging of the chest was performed following the standard protocol without intravenous contrast. High resolution imaging of the lungs, as well as inspiratory and expiratory imaging, was performed. COMPARISON:  11/01/2015 chest CT. FINDINGS: Mediastinum/Nodes: Normal heart size. No pericardial fluid/thickening. Left anterior descending coronary atherosclerosis . Great vessels are normal in course and caliber. Normal visualized thyroid. There is scattered mild fluid throughout the thoracic esophagus. No axillary adenopathy. No appreciable change in mild to moderate right paratracheal and subcarinal mediastinal adenopathy. Stable poorly delineated mild right hilar adenopathy. Lungs/Pleura: No pneumothorax. Small right and trace left pleural effusions, increased. Mild centrilobular and paraseptal emphysema and diffuse bronchial wall thickening. There is essentially complete consolidation of the right lower lobe, with significant interval worsening. There is a 3.4 x 2.4 cm right lower lobe thick walled cavity with increasing internal fluid, not appreciably changed in size. There is underlying severe cylindrical and varicoid bronchiectasis throughout the right middle and right lower lobes, as evidenced by dense  mucoid impaction throughout the entire right middle lobe bronchial tree. There is worsening consolidation in the basilar right middle lobe. There is mild cylindrical bronchiectasis throughout the left lower lobe and minimal cylindrical bronchiectasis in the lingula. New bandlike ground-glass opacity in the basilar left lower lobe is favored to represent atelectasis. No significant pulmonary nodules or lung masses. No significant regions of subpleural reticulation, traction bronchiectasis or frank honeycombing. No appreciable air trapping on the limited expiration sequence. Upper abdomen:  Unremarkable . Musculoskeletal: No aggressive appearing focal osseous lesions. Mild degenerative changes in the thoracic spine. IMPRESSION: 1. Significant interval worsening of cavitary infection in the right middle and right lower lobes, with now complete right lower lobe consolidation and worsening basilar right middle lobe consolidation. Underlying severe cylindrical and varicoid bronchiectasis throughout the right middle and right lower lobes. The patient has experienced multiple similar waxing and waning infections in this territory on multiple prior chest CT studies back to 07/06/2014. Differential considerations include recurrent aspiration or underlying immunodeficiency. 2. Mild left lower lobe and minimal lingular cylindrical bronchiectasis, with no acute consolidative airspace disease in the left lung or right upper lobe. 3. Mild centrilobular and paraseptal emphysema and diffuse bronchial wall thickening, suggesting COPD. 4. Small right and trace left pleural effusions, increased. 5. Stable nonspecific mild to moderate mediastinal and right hilar lymphadenopathy. 6. One vessel coronary atherosclerosis. Electronically Signed   By: Ilona Sorrel M.D.   On: 11/03/2015 11:28  Scheduled Meds: . ALPRAZolam  0.5 mg Oral QHS  . antiseptic oral rinse  7 mL Mouth Rinse BID  . cefTAZidime (FORTAZ)  IV  2 g Intravenous Q8H   . divalproex  500 mg Oral QHS  . enoxaparin (LOVENOX) injection  40 mg Subcutaneous Q24H  . feeding supplement (ENSURE ENLIVE)  237 mL Oral BID BM  . guaiFENesin  600 mg Oral BID  . Influenza vac split quadrivalent PF  0.5 mL Intramuscular Tomorrow-1000  . mometasone-formoterol  2 puff Inhalation BID  . nortriptyline  100 mg Oral QHS  . vancomycin  500 mg Intravenous Q12H   Continuous Infusions: . sodium chloride 100 mL/hr at 11/03/15 0207    Active Problems:   Hyponatremia   Pneumonia   Community acquired pneumonia    Time spent: 25 min    [facility] Pager [phone] If 7PM-7AM, please contact night-coverage at [email or web address], password [facility] 11/03/2015, 2:54 PM  LOS: 2 days

## 2015-11-03 NOTE — Progress Notes (Signed)
Name: Catherine Kelley MRN: [id number] DOB: 02/02/1951    ADMISSION DATE:  11/01/2015 CONSULTATION DATE:  12/1  REFERRING MD :  Darrick Meigs ([facility])   CHIEF COMPLAINT:  PNA  BRIEF PATIENT DESCRIPTION:  64 y/o female smoker with hx COPD, RML/RLL bronchiectasis after a necrotizing PNA in 2015 with frequent bronchiectasis flares and ongoing outpt w/u of persistent RLL/RML infiltrate on CXR followed by Dr. Chase Caller.  Presented to ER 112/1 with persistent dizziness and falling as well as worsening productive cough.  Admitted by [facility] with ?HCAP vs bronchiectasis flare.  CXR with progressive infiltrates and PCCM consulted.      SIGNIFICANT EVENTS  12/01  Admit for dizziness, falls and worsening productive cough.  STUDIES:  CT head 12/1>>> neg acute, chronic sinus disease   SUBJECTIVE: Pt reports ongoing green/yellow sputum production.  Indicates CT completed around 4am.  Denies chest pain, SOB    VITAL SIGNS: Temp:  [97.8 F (36.6 C)-98.9 F (37.2 C)] 97.9 F (36.6 C) (12/03 2202) Pulse Rate:  [80-87] 80 (12/03 0623) Resp:  [18-20] 18 (12/03 0623) BP: (103-122)/(44-55) 119/51 mmHg (12/03 0623) SpO2:  [93 %-97 %] 97 % (12/03 0623)  PHYSICAL EXAMINATION:  General:  Chronically ill appearing female, NAD   Neuro:  Awake, alert, appropriate, MAE  HEENT:  Mm dry, no JVD  Cardiovascular:  s1s2 rrr Lungs:  resps even non-labored on [place], rhonchi on R, otherwise ess clear  Abdomen:  Round, soft, +bs  Musculoskeletal:  Warm and dry, no edema   Recent Labs Lab 11/01/15 1141 11/02/15 0615 11/03/15 0439  NA 124* 128* 131*  K 2.1* 2.7* 3.0*  CL 77* 82* 92*  CO2 39* 37* 33*  BUN 6 <5* <5*  CREATININE 0.49 0.48 0.39*  GLUCOSE 124* 98 99    Recent Labs Lab 11/01/15 1141 11/02/15 0615  HGB 10.3* 11.1*  HCT 30.6* 33.8*  WBC 9.7 8.1  PLT 183 203   Dg Chest 2 View  11/01/2015  CLINICAL DATA:  Weakness, dizziness at work, fall, cough EXAM: CHEST  2 VIEW COMPARISON:  09/27/2015 FINDINGS:  Worsening infiltrate/ pneumonia in right lower lobe and middle lobe. Cardiomediastinal silhouette is stable. Left lung is clear. IMPRESSION: Worsening infiltrate/pneumonia in right lower lobe and right middle lobe. Follow-up to resolution after appropriate treatment is recommended. Electronically Signed   By: Lahoma Crocker M.D.   On: 11/01/2015 13:11   Ct Head Wo Contrast  11/01/2015  CLINICAL DATA:  Pt fell and hit back of head and buttocks. Dizzy. States this is the second time she has fallen this week. Feels weak. Also incontinent EXAM: CT HEAD WITHOUT CONTRAST TECHNIQUE: Contiguous axial images were obtained from the base of the skull through the vertex without intravenous contrast. COMPARISON:  07/08/2015 FINDINGS: There is central and cortical atrophy. There is no intra or extra-axial fluid collection or mass lesion. The basilar cisterns and ventricles have a normal appearance. There is no CT evidence for acute infarction or hemorrhage. Bone windows demonstrate significant opacification of the left maxillary sinus, similar in appearance to the previous exam. There is improved aeration of the right maxillary sinus although there is persistent mucoperiosteal thickening. No air-fluid levels are identified. No calvarial fracture. The mastoid air cells are normally aerated. IMPRESSION: 1.  No evidence for acute intracranial abnormality. 2. Chronic sinus disease. Electronically Signed   By: Nolon Nations M.D.   On: 11/01/2015 13:42   Ct Chest Wo Contrast  11/01/2015  CLINICAL DATA:  64 year old female with history  of pneumonia. Cough for the past several months. Originally diagnosed with right lower lobe pneumonia in May 2015. EXAM: CT CHEST WITHOUT CONTRAST TECHNIQUE: Multidetector CT imaging of the chest was performed following the standard protocol without IV contrast. COMPARISON:  Chest CT 08/16/2015. FINDINGS: Mediastinum/Lymph Nodes: Heart size is borderline enlarged. There is no significant pericardial  fluid, thickening or pericardial calcification. There is atherosclerosis of the thoracic aorta, the great vessels of the mediastinum and the coronary arteries, including calcified atherosclerotic plaque in the left main, left anterior descending, left circumflex and right coronary arteries. Multiple enlarged mediastinal lymph nodes in the right peritracheal nodal station measuring up to 13 mm in short axis. Enlarged subcarinal nodes measuring up to 16 mm in short axis. Probable right hilar lymphadenopathy which cannot be measured on today's noncontrast CT examination. Esophagus is unremarkable in appearance. No axillary lymphadenopathy. Lungs/Pleura: There is severe cylindrical and cystic bronchiectasis throughout the right middle and lower lobes. Several large nodular and mass-like areas of chronic consolidation are again noted throughout the right lower lobe. Thick-walled cavitary lesion in the right lower lobe again noted, currently measuring approximately 3.5 x 2.6 cm (image 35 of series 205). Extensive thickening of the peribronchovascular interstitium throughout the right middle and lower lobe. Less severe generalized bronchial wall thickening throughout the lungs bilaterally with mild centrilobular and paraseptal emphysema. Overall, compared to prior study from 08/16/2015, the severity of bronchiectasis has increased in the right lung, while the patchy multifocal airspace consolidation has slightly decreased throughout the right lung. No new airspace consolidation in the left lung. Mild linear scarring in the left lower lobe. Upper Abdomen: Atherosclerosis. Musculoskeletal/Soft Tissues: There are no aggressive appearing lytic or blastic lesions noted in the visualized portions of the skeleton. IMPRESSION: 1. Chronic complications of necrotizing pneumonia in the right lower lobe with areas of chronic airspace consolidation throughout the right middle and lower lobes demonstrating some mild improvement compared  to prior studies, but with worsening areas of varicose and cystic bronchiectasis throughout this same distribution, multiple chronic nodular and mass-like appearing areas of consolidation, and chronic thick-walled cavitary area in the right lower lobe, as above. Given of these findings since the persistence May 2015, thoracic surgical consultation for right middle and right lower lobectomy might be considered, as these areas of chronic infection are unlikely to clear given the severity bronchiectasis throughout these regions on today's examination. At this time, there does not appear to be infectious involvement of the right upper lobe, left lower lobe or left upper lobe. 2. Mild generalized bronchial wall thickening and mild centrilobular and paraseptal emphysema; imaging findings compatible with the reported clinical history of COPD. 3. Atherosclerosis, including 3 vessel coronary artery disease. Please note that although the presence of coronary artery calcium documents the presence of coronary artery disease, the severity of this disease and any potential stenosis cannot be assessed on this non-gated CT examination. Assessment for potential risk factor modification, dietary therapy or pharmacologic therapy may be warranted, if clinically indicated. These results were called by telephone at the time of interpretation on 11/01/2015 at 6:01 pm to Dr. Melvyn Novas, who verbally acknowledged these results. Electronically Signed   By: Vinnie Langton M.D.   On: 11/01/2015 18:03   Dg Hips Bilat With Pelvis 3-4 Views  11/01/2015  CLINICAL DATA:  Fall EXAM: DG HIP (WITH OR WITHOUT PELVIS) 3-4V BILAT COMPARISON:  None. FINDINGS: There is no evidence of hip fracture or dislocation. There is no evidence of arthropathy or other focal bone abnormality.  IMPRESSION: Negative. Electronically Signed   By: Franchot Gallo M.D.   On: 11/01/2015 13:12    ASSESSMENT / PLAN: COPD - without acute exacerbation  Recurrent PNA -  recurrent/worsening RML/RLL infiltrate.  ?aspiration.  Severe RML/RLL bronchiectasis   After review of CT, doubt she has an ILD. She however has severe structural damage to the right lower lobe with a cavitary lesion and significant bronchiectasis likely from the necrotizing pneumonia in 2015. Suspect she is having recurrent pneumonias because of this and inability to drain secretions. We need to evaluate for other atypical organisms such as MAI or fungal infections given the nodular infiltrates in previous scans. Not sure a bronchoscopy would be of additional help as she is making copious amounts of purulent sputum. However we can consider it if the sputum cultures are unrevealing. She may need a CT surgery consult for right lower lobe resection. There is no need for steroids at this point  Recommendations: - Follow up HR CT chest without contrast  - Follow Sputum cultures, AFB for MAI, fungal cultures  - Cont IV abx as per primary to cover HCAP  - Autoimmune workup pending - ANA, ANCA, HIV non-reactive, ESR 74, RF 10.2 - Consider bronchoscopy or CT surgery eval depending on the above workup.    PCCM will see again 12/5.  Please call sooner if new needs arise.    Noe Gens, NP-C [facility] Pgr: [phone] or if no answer [phone] 11/03/2015, 9:15 AM

## 2015-11-03 NOTE — Progress Notes (Signed)
ANTIBIOTIC CONSULT NOTE - Follow Up  Pharmacy Consult for vancomycin + ceftazidime Indication: rule out pneumonia  Allergies  Allergen Reactions  . Ceclor [Cefaclor] Hives  . Escitalopram Oxalate     Pt does not recall ever taking medication  . Sertraline Hcl     UNKNOWN  . Sulfa Drugs Cross Reactors Hives and Rash    Hives and rash   Patient Measurements: Height: '5\' 3"'$  (160 cm) Weight: 130 lb (58.968 kg) IBW/kg (Calculated) : 52.4  Vital Signs: Temp: 98.5 F (36.9 C) (12/03 1346) Temp Source: Oral (12/03 1346) BP: 117/40 mmHg (12/03 1346) Pulse Rate: 80 (12/03 1346)  Labs:  Recent Labs  11/01/15 1141 11/02/15 0615 11/03/15 0439  WBC 9.7 8.1  --   HGB 10.3* 11.1*  --   PLT 183 203  --   CREATININE 0.49 0.48 0.39*   Estimated Creatinine Clearance: 58 mL/min (by C-G formula based on Cr of 0.39).  Recent Labs  11/03/15 1647  Day <4*    Microbiology: Recent Results (from the past 720 hour(s))  Culture, expectorated sputum-assessment     Status: None   Collection Time: 11/02/15  1:30 PM  Result Value Ref Range Status   Specimen Description SPUTUM  Final   Special Requests NONE  Final   Sputum evaluation   Final    THIS SPECIMEN IS ACCEPTABLE. RESPIRATORY CULTURE REPORT TO FOLLOW.   Report Status 11/02/2015 FINAL  Final  AFB culture with smear     Status: None (Preliminary result)   Collection Time: 11/02/15  1:30 PM  Result Value Ref Range Status   Specimen Description SPUTUM  Final   Special Requests NONE  Final   Acid Fast Smear   Final    NO ACID FAST BACILLI SEEN Performed at [facility]    Culture   Final    CULTURE WILL BE EXAMINED FOR 6 WEEKS BEFORE ISSUING A FINAL REPORT Performed at [facility]    Report Status PENDING  Incomplete   Medical History: Past Medical History  Diagnosis Date  . Hypertension     essential hypertension  . Tobacco abuse     smoking about 1/2 pk of cigarettes a day  . Anxiety   .  Depression     psychiatrist Dr. Toy Care  . Emphysematous COPD ([place])     changes in right base along with patchy areas on last x-ray  . COPD (chronic obstructive pulmonary disease) ([place])   . History of echocardiogram 12/24/2006    Est. EF of 43-15% NORMAL LV SYSTOLIC FUNCTION WITH IMPAIRED RELAXATION -- MILD AORTIC SCLEROSIS -- NORMAL PALONARY ARTERY PRESSURE -- NO OLD ECHOS FOR COMPARISON -- [name], MD  . History of cardiovascular stress test 08/15/2004    EF of 70% -- Normal stress cardiolite.  There is no evidence of ischemia and there is normal LV function. -- [name]. MD  . Diastolic dysfunction   . Allergy   . Asthma   . Heart murmur   . Pneumonia     "several times since May 2015" (07/06/2014)  . Chronic bronchitis ([place])     "get it alot; maybe not q yr" (07/06/2014)  . GERD (gastroesophageal reflux disease)    Medications:  Prescriptions prior to admission  Medication Sig Dispense Refill Last Dose  . ALPRAZolam (XANAX) 0.5 MG tablet Take 0.5 mg by mouth at bedtime.    10/31/2015 at Unknown time  . Calcium Carbonate-Vitamin D (CALCIUM-VITAMIN D) 500-200 MG-UNIT per tablet  Take 1 tablet by mouth daily.   Past Month at Unknown time  . divalproex (DEPAKOTE ER) 500 MG 24 hr tablet Take 500 mg by mouth at bedtime.    10/31/2015 at Unknown time  . fluticasone (FLONASE) 50 MCG/ACT nasal spray Place 2 sprays into both nostrils daily. (Patient taking differently: Place 2 sprays into both nostrils daily as needed for allergies. ) 16 g 2 Past Month at Unknown time  . furosemide (LASIX) 20 MG tablet TAKE 2 TABLETS BY MOUTH EVERY DAY 180 tablet 1 11/01/2015 at Unknown time  . HYDROcodone-acetaminophen (NORCO/VICODIN) 5-325 MG tablet Take 1 tablet by mouth every 4 (four) hours as needed for moderate pain (or cough). 60 tablet 0 Past Month at Unknown time  . losartan-hydrochlorothiazide (HYZAAR) 50-12.5 MG tablet Take 0.5 tablets by mouth daily.   11/01/2015 at Unknown time  .  mometasone-formoterol (DULERA) 100-5 MCG/ACT AERO Take 2 puffs first thing in am and then another 2 puffs about 12 hours later. (Patient taking differently: Inhale 2 puffs into the lungs 2 (two) times daily. Take 2 puffs first thing in am and then another 2 puffs about 12 hours later.)   10/31/2015 at Unknown time  . Multiple Vitamin (MULTIVITAMIN) tablet Take 1 tablet by mouth daily.   Past Month at Unknown time  . nadolol (CORGARD) 40 MG tablet Take 20 mg by mouth daily.    11/01/2015 at Unknown time  . nortriptyline (PAMELOR) 50 MG capsule Take 100 mg by mouth at bedtime.   10/31/2015 at Unknown time  . tiZANidine (ZANAFLEX) 4 MG tablet Take 1 tablet (4 mg total) by mouth every 6 (six) hours as needed for muscle spasms. 40 tablet 0 Past Month at Unknown time  . amoxicillin-clavulanate (AUGMENTIN) 875-125 MG tablet Take 1 tablet by mouth daily.   Taking  . benzonatate (TESSALON) 100 MG capsule TAKE 1 TO 2 CAPSULES BY MOUTH 3 TIMES DAILY AS NEEDED FOR COUGH (Patient not taking: Reported on 11/01/2015) 60 capsule 2 Taking  . ipratropium (ATROVENT) 0.03 % nasal spray Place 2 sprays into both nostrils 2 (two) times daily.  11 Taking  . saccharomyces boulardii (FLORASTOR) 250 MG capsule Take 1 capsule (250 mg total) by mouth 2 (two) times daily. (Patient not taking: Reported on 11/01/2015) 30 capsule 0 Taking   Assessment: 64 yo female admitted in 08/2015 with pneumonia and sepsis. Presenting now with dizziness, s/p fall, and productive cough. CT chest shows worsening infiltrate/pneumonia in right lower lobe and right middle lobe.  Vancomycin trough drawn a little late but results back at < 74mg/ml which is below desired goal.  This indicates better than expected clearance.  Goal of Therapy:  Vancomycin trough level 15-20 mcg/ml  Plan:  Continue IV Ceftazidime 2 g q8h Give Vancomycin 1000 mg x1 (~17 mg/kg), then increase IV Vancomycin to '750mg'$  IV q12 h Expected duration 7 days with resolution of  temperature and/or normalization of WBC Measure antibiotic drug levels at steady state Follow up culture results  NRober Minion PharmD., MS Clinical Pharmacist Pager:  [phone] you for allowing pharmacy to be part of this patients care team. 11/03/2015,6:22 PM

## 2015-11-04 ENCOUNTER — Inpatient Hospital Stay (HOSPITAL_COMMUNITY): Payer: Managed Care, Other (non HMO)

## 2015-11-04 LAB — BASIC METABOLIC PANEL
ANION GAP: 5 (ref 5–15)
CHLORIDE: 96 mmol/L — AB (ref 101–111)
CO2: 30 mmol/L (ref 22–32)
Calcium: 8.4 mg/dL — ABNORMAL LOW (ref 8.9–10.3)
Creatinine, Ser: 0.41 mg/dL — ABNORMAL LOW (ref 0.44–1.00)
Glucose, Bld: 94 mg/dL (ref 65–99)
POTASSIUM: 3.7 mmol/L (ref 3.5–5.1)
SODIUM: 131 mmol/L — AB (ref 135–145)

## 2015-11-04 MED ORDER — BISACODYL 10 MG RE SUPP
10.0000 mg | Freq: Once | RECTAL | Status: AC
  Administered 2015-11-04: 10 mg via RECTAL
  Filled 2015-11-04: qty 1

## 2015-11-04 NOTE — Progress Notes (Signed)
[facility] PROGRESS NOTE  Catherine Kelley JME:[id number] DOB: [birth year] DOA: 11/01/2015 PCP: [name], MD  Assessment/Plan: Right middle lobe and lower lobe pneumonia Likely from aspiration, patient moderate aspiration risk started on dysphagia 2 diet. Patient seen by pulmonary, CT chest without contrast ordered. Also note cultures AFB for MAI, fungal cultures autoimmune  Workup. Patient was diagnosed with pneumonia in March 2016 and has been treated multiple times with IV antibiotics. CT chest on 9/15 showed widespread airspace consolidation throat admitted and lower lobe. She was seen by pulmonary on 10/10/2015 and plan was to do FOB/BAL if no improvement in next 4 weeks. She has been started on Fortaz and vancomycin for healthcare associated pneumonia  Hyponatremia Patient sodium is 131, now improved. Patient on Lasix and HCTZ at home which is currently on hold.  Fluid restriction 1200 mL per day. Follow BMP in a.m.  Hypokalemia Replete. Repeat potassium is 3.7  Status post fall Likely from electrolyte abnormalities as well as hypotension. Hold diuretics, IV hydration.  DVT prophylaxis Lovenox   Code Status: Full code Family Communication: *No family present at bedside Disposition Plan: Pending improvement in respiratory status   Consultants:  Pulmonary  Procedures:  None   Antibiotics:  Vancomycin  Ceftazidime  HPI/Subjective: 64 yr old female who  has a past medical history of Hypertension; Tobacco abuse; Anxiety; Depression; Emphysematous COPD ([place]); COPD (chronic obstructive pulmonary disease) ([place]); History of echocardiogram (12/24/2006); History of cardiovascular stress test (08/15/2004); Diastolic dysfunction; Allergy; Asthma; Heart murmur; Pneumonia; Chronic bronchitis ([place]); and GERD (gastroesophageal reflux disease). Today patient comes to the hospital after she had a fall at home hitting her head on the concrete. Patient says that she has been dizzy  for a while and dizziness become worse from sitting to standing and walking. She denies chest pain or shortness of breath. She has been feeling dizzy. Patient also complains of extreme thirst. She takes Lasix as well as hydrochlorothiazide at home.  Patient says breathing is same, no change in symptoms. Speech therapy evaluation done and patient deemed moderate aspiration risk. Started on dysphagia 2 diet with thin liquids  Objective: Filed Vitals:   11/03/15 2250 11/04/15 0620  BP: 142/67 119/47  Pulse: 96 93  Temp: 97.6 F (36.4 C) 97.5 F (36.4 C)  Resp: 18 18    Intake/Output Summary (Last 24 hours) at 11/04/15 1110 Last data filed at 11/04/15 1029  Gross per 24 hour  Intake   1610 ml  Output      0 ml  Net   1610 ml   Filed Weights   11/01/15 1100  Weight: 58.968 kg (130 lb)    Exam:   General:  Appears in no acute distress  Cardiovascular: S1S2 RRR  Respiratory: decreased breath sounds at lung bases  Abdomen: Soft, nontender  Musculoskeletal: No cyanosis/clubbing/edema of the lower extremities  Data Reviewed: Basic Metabolic Panel:  Recent Labs Lab 11/01/15 1141 11/01/15 1859 11/02/15 0615 11/03/15 0439 11/03/15 1530 11/04/15 0601  NA 124*  --  128* 131*  --  131*  K 2.1*  --  2.7* 3.0*  --  3.7  CL 77*  --  82* 92*  --  96*  CO2 39*  --  37* 33*  --  30  GLUCOSE 124*  --  98 99  --  94  BUN 6  --  <5* <5*  --  <5*  CREATININE 0.49  --  0.48 0.39*  --  0.41*  CALCIUM 8.2*  --  8.4* 8.2*  --  8.4*  MG  --  1.9  --   --  1.7  --    Liver Function Tests:  Recent Labs Lab 11/02/15 0615  AST 19  ALT 14  ALKPHOS 90  BILITOT 0.7  PROT 6.6  ALBUMIN 2.0*   No results for input(s): LIPASE, AMYLASE in the last 168 hours. No results for input(s): AMMONIA in the last 168 hours. CBC:  Recent Labs Lab 11/01/15 1141 11/02/15 0615  WBC 9.7 8.1  NEUTROABS 7.6  --   HGB 10.3* 11.1*  HCT 30.6* 33.8*  MCV 84.1 86.0  PLT 183 203   CBG: No  results for input(s): GLUCAP in the last 168 hours.  Recent Results (from the past 240 hour(s))  Culture, expectorated sputum-assessment     Status: None   Collection Time: 11/02/15  1:30 PM  Result Value Ref Range Status   Specimen Description SPUTUM  Final   Special Requests NONE  Final   Sputum evaluation   Final    THIS SPECIMEN IS ACCEPTABLE. RESPIRATORY CULTURE REPORT TO FOLLOW.   Report Status 11/02/2015 FINAL  Final  AFB culture with smear     Status: None (Preliminary result)   Collection Time: 11/02/15  1:30 PM  Result Value Ref Range Status   Specimen Description SPUTUM  Final   Special Requests NONE  Final   Acid Fast Smear   Final    NO ACID FAST BACILLI SEEN Performed at [facility]    Culture   Final    CULTURE WILL BE EXAMINED FOR 6 WEEKS BEFORE ISSUING A FINAL REPORT Performed at [facility]    Report Status PENDING  Incomplete     Studies: Ct Chest High Resolution  11/03/2015  CLINICAL DATA:  64 year old female inpatient with chronic bronchiectasis and persistent productive cough. EXAM: CT CHEST WITHOUT CONTRAST TECHNIQUE: Multidetector CT imaging of the chest was performed following the standard protocol without intravenous contrast. High resolution imaging of the lungs, as well as inspiratory and expiratory imaging, was performed. COMPARISON:  11/01/2015 chest CT. FINDINGS: Mediastinum/Nodes: Normal heart size. No pericardial fluid/thickening. Left anterior descending coronary atherosclerosis . Great vessels are normal in course and caliber. Normal visualized thyroid. There is scattered mild fluid throughout the thoracic esophagus. No axillary adenopathy. No appreciable change in mild to moderate right paratracheal and subcarinal mediastinal adenopathy. Stable poorly delineated mild right hilar adenopathy. Lungs/Pleura: No pneumothorax. Small right and trace left pleural effusions, increased. Mild centrilobular and paraseptal emphysema and diffuse  bronchial wall thickening. There is essentially complete consolidation of the right lower lobe, with significant interval worsening. There is a 3.4 x 2.4 cm right lower lobe thick walled cavity with increasing internal fluid, not appreciably changed in size. There is underlying severe cylindrical and varicoid bronchiectasis throughout the right middle and right lower lobes, as evidenced by dense mucoid impaction throughout the entire right middle lobe bronchial tree. There is worsening consolidation in the basilar right middle lobe. There is mild cylindrical bronchiectasis throughout the left lower lobe and minimal cylindrical bronchiectasis in the lingula. New bandlike ground-glass opacity in the basilar left lower lobe is favored to represent atelectasis. No significant pulmonary nodules or lung masses. No significant regions of subpleural reticulation, traction bronchiectasis or frank honeycombing. No appreciable air trapping on the limited expiration sequence. Upper abdomen:  Unremarkable . Musculoskeletal: No aggressive appearing focal osseous lesions. Mild degenerative changes in the thoracic spine. IMPRESSION: 1. Significant interval worsening of cavitary infection  in the right middle and right lower lobes, with now complete right lower lobe consolidation and worsening basilar right middle lobe consolidation. Underlying severe cylindrical and varicoid bronchiectasis throughout the right middle and right lower lobes. The patient has experienced multiple similar waxing and waning infections in this territory on multiple prior chest CT studies back to 07/06/2014. Differential considerations include recurrent aspiration or underlying immunodeficiency. 2. Mild left lower lobe and minimal lingular cylindrical bronchiectasis, with no acute consolidative airspace disease in the left lung or right upper lobe. 3. Mild centrilobular and paraseptal emphysema and diffuse bronchial wall thickening, suggesting COPD. 4.  Small right and trace left pleural effusions, increased. 5. Stable nonspecific mild to moderate mediastinal and right hilar lymphadenopathy. 6. One vessel coronary atherosclerosis. Electronically Signed   By: Ilona Sorrel M.D.   On: 11/03/2015 11:28   Dg Swallowing Func-speech Pathology  11/04/2015  Objective Swallowing Evaluation:   Patient Details Name: Catherine Kelley MRN: [id number] Date of Birth: 06-25-1951 Today's Date: 11/04/2015 Time: SLP Start Time (ACUTE ONLY): 0900-SLP Stop Time (ACUTE ONLY): 0930 SLP Time Calculation (min) (ACUTE ONLY): 30 min Past Medical History: Past Medical History Diagnosis Date . Hypertension    essential hypertension . Tobacco abuse    smoking about 1/2 pk of cigarettes a day . Anxiety  . Depression    psychiatrist Dr. Toy Care . Emphysematous COPD ([place])    changes in right base along with patchy areas on last x-ray . COPD (chronic obstructive pulmonary disease) ([place])  . History of echocardiogram 12/24/2006   Est. EF of 76-19% NORMAL LV SYSTOLIC FUNCTION WITH IMPAIRED RELAXATION -- MILD AORTIC SCLEROSIS -- NORMAL PALONARY ARTERY PRESSURE -- NO OLD ECHOS FOR COMPARISON -- [name], MD . History of cardiovascular stress test 08/15/2004   EF of 70% -- Normal stress cardiolite.  There is no evidence of ischemia and there is normal LV function. -- [name]. MD . Diastolic dysfunction  . Allergy  . Asthma  . Heart murmur  . Pneumonia    "several times since May 2015" (07/06/2014) . Chronic bronchitis ([place])    "get it alot; maybe not q yr" (07/06/2014) . GERD (gastroesophageal reflux disease)  Past Surgical History: Past Surgical History Procedure Laterality Date . Tubal ligation  1986 HPI: 64 yo female smoker with hx COPD, RML/RLL bronchiectasis after a necrotizing PNA in 2015 with frequent bronchiectasis flares and ongoing outpt w/u of persistent RLL/RML infiltrate on CXR followed by Dr. Chase Caller. Presented to ER 12/1 with persistent dizziness and falling as well as worsening  productive cough. Admitted by [facility] with ?HCAP v bronchiectasis flare. CXR with progressive infiltrates.  Subjective: pt denies difficulty swallowing, but does endorse frequent coughing while eating Assessment / Plan / Recommendation CHL IP CLINICAL IMPRESSIONS 11/04/2015 Therapy Diagnosis Mild pharyngeal phase dysphagia Clinical Impression Pt currently demonstrating a mild pharyngeal dysphagia characterized by a delay in swallow initiation to the level of the vallecula/ pyriform sinuses with thin liquids. No penetration/ aspiration was visualized during this study. Only trace residuals noted at the level of the CP segment. Pt did cough up a small amount of puree material about 5 seconds post-swallow- no penetrated or aspirated material visualized after this incident. No reflux to the level of the cervical esophagus or pharyngeal cavity was seen during or immediately after the swallow; however, the coughing incident does raise concern for possible esophageal involvement, especially given pt hx of GERD. Given cough incident and decreased respiratory status, aspiration risk appears moderate at this time.  Recommend continuing dysphagia 2 diet, thin liquids, meds whole in puree, full supervision during meals to ensure pt is seated upright, taking small bites/ sips, sits upright 30-60 minutes after meal, encourage pt to pause/ catch breath between bites/ sips. Will continue to follow for diet tolerance/ consider advancement. Provided education to pt on results and compensatory strategies. Pt in agreement with recommendations. Impact on safety and function Moderate aspiration risk   CHL IP TREATMENT RECOMMENDATION 11/04/2015 Treatment Recommendations Therapy as outlined in treatment plan below   Prognosis 11/02/2015 Prognosis for Safe Diet Advancement (No Data) Barriers to Reach Goals -- Barriers/Prognosis Comment -- CHL IP DIET RECOMMENDATION 11/04/2015 SLP Diet Recommendations Dysphagia 2 (Fine chop) solids;Thin liquid  Liquid Administration via Cup;Straw Medication Administration Whole meds with puree Compensations Slow rate;Small sips/bites Postural Changes Seated upright at 90 degrees   CHL IP OTHER RECOMMENDATIONS 11/04/2015 Recommended Consults Consider esophageal assessment Oral Care Recommendations Oral care BID Other Recommendations --   CHL IP FOLLOW UP RECOMMENDATIONS 11/02/2015 Follow up Recommendations (No Data)   CHL IP FREQUENCY AND DURATION 11/04/2015 Speech Therapy Frequency (ACUTE ONLY) min 2x/week Treatment Duration 2 weeks      CHL IP ORAL PHASE 11/04/2015 Oral Phase WFL Oral - Pudding Teaspoon -- Oral - Pudding Cup -- Oral - Honey Teaspoon -- Oral - Honey Cup -- Oral - Nectar Teaspoon -- Oral - Nectar Cup -- Oral - Nectar Straw -- Oral - Thin Teaspoon -- Oral - Thin Cup -- Oral - Thin Straw -- Oral - Puree -- Oral - Mech Soft -- Oral - Regular -- Oral - Multi-Consistency -- Oral - Pill -- Oral Phase - Comment --  CHL IP PHARYNGEAL PHASE 11/04/2015 Pharyngeal Phase Impaired Pharyngeal- Pudding Teaspoon -- Pharyngeal -- Pharyngeal- Pudding Cup -- Pharyngeal -- Pharyngeal- Honey Teaspoon -- Pharyngeal -- Pharyngeal- Honey Cup -- Pharyngeal -- Pharyngeal- Nectar Teaspoon -- Pharyngeal -- Pharyngeal- Nectar Cup -- Pharyngeal -- Pharyngeal- Nectar Straw -- Pharyngeal -- Pharyngeal- Thin Teaspoon -- Pharyngeal -- Pharyngeal- Thin Cup Delayed swallow initiation-vallecula Pharyngeal -- Pharyngeal- Thin Straw Delayed swallow initiation-vallecula;Delayed swallow initiation-pyriform sinuses Pharyngeal -- Pharyngeal- Puree Delayed swallow initiation-vallecula Pharyngeal -- Pharyngeal- Mechanical Soft Delayed swallow initiation-vallecula Pharyngeal -- Pharyngeal- Regular -- Pharyngeal -- Pharyngeal- Multi-consistency -- Pharyngeal -- Pharyngeal- Pill Delayed swallow initiation-vallecula Pharyngeal -- Pharyngeal Comment --  CHL IP CERVICAL ESOPHAGEAL PHASE 11/04/2015 Cervical Esophageal Phase WFL Pudding Teaspoon -- Pudding Cup --  Honey Teaspoon -- Honey Cup -- Nectar Teaspoon -- Nectar Cup -- Nectar Straw -- Thin Teaspoon -- Thin Cup -- Thin Straw -- Puree -- Mechanical Soft -- Regular -- Multi-consistency -- Pill -- Cervical Esophageal Comment -- Kern Reap, MA, CCC-SLP 11/04/2015, 9:56 AM [id number]              Scheduled Meds: . ALPRAZolam  0.5 mg Oral QHS  . antiseptic oral rinse  7 mL Mouth Rinse BID  . cefTAZidime (FORTAZ)  IV  2 g Intravenous Q8H  . divalproex  500 mg Oral QHS  . enoxaparin (LOVENOX) injection  40 mg Subcutaneous Q24H  . feeding supplement (ENSURE ENLIVE)  237 mL Oral BID BM  . guaiFENesin  600 mg Oral BID  . Influenza vac split quadrivalent PF  0.5 mL Intramuscular Tomorrow-1000  . mometasone-formoterol  2 puff Inhalation BID  . nortriptyline  100 mg Oral QHS  . polyethylene glycol  17 g Oral Daily  . vancomycin  750 mg Intravenous Q12H   Continuous Infusions: . sodium chloride 100 mL/hr at 11/03/15 0207  Active Problems:   Hyponatremia   Pneumonia   Community acquired pneumonia   Malnutrition of moderate degree   Pulmonary emphysema ([place])    Time spent: 25 min    [place] [facility] Pager [phone] If 7PM-7AM, please contact night-coverage at [email or web address], password [facility] 11/04/2015, 11:10 AM  LOS: 3 days

## 2015-11-04 NOTE — Progress Notes (Signed)
MBSS complete. Full report located under chart review in imaging section.  Recommendations: Continue dysphagia 2 diet, thin liquids, meds whole in puree, full supervision- cue small bites/ sips, ensure pt seated upright 30-60 minutes after meal.  Blair Heys, [place], [place]

## 2015-11-05 DIAGNOSIS — J470 Bronchiectasis with acute lower respiratory infection: Secondary | ICD-10-CM

## 2015-11-05 LAB — BASIC METABOLIC PANEL
Anion gap: 5 (ref 5–15)
CHLORIDE: 97 mmol/L — AB (ref 101–111)
CO2: 30 mmol/L (ref 22–32)
Calcium: 8.4 mg/dL — ABNORMAL LOW (ref 8.9–10.3)
Creatinine, Ser: 0.36 mg/dL — ABNORMAL LOW (ref 0.44–1.00)
GFR calc Af Amer: >60 mL/min (ref >60)
GFR calc non Af Amer: >60 mL/min (ref >60)
GLUCOSE: 95 mg/dL (ref 65–99)
POTASSIUM: 3.9 mmol/L (ref 3.5–5.1)
Sodium: 132 mmol/L — ABNORMAL LOW (ref 135–145)

## 2015-11-05 LAB — ANTINUCLEAR ANTIBODIES, IFA: ANTINUCLEAR ANTIBODIES, IFA: NEGATIVE

## 2015-11-05 LAB — ANCA TITERS
Atypical P-ANCA titer: <1:20 {titer}
C-ANCA: <1:20 {titer}

## 2015-11-05 LAB — MPO/PR-3 (ANCA) ANTIBODIES

## 2015-11-05 NOTE — Progress Notes (Addendum)
Speech Language Pathology Treatment: Dysphagia  Patient Details Name: Catherine Kelley MRN: [id number] DOB: 02-09-1951 Today's Date: 11/05/2015 Time: 4496-7591 SLP Time Calculation (min) (ACUTE ONLY): 16 min  Assessment / Plan / Recommendation Clinical Impression  Pt consumed Dys 3 texture, for appropriateness to upgrade texture. Mastication and transfer timely; min left labial residue without awareness. Pt states she frequently coughs at the end of meals ("mucous") and as SLP leaving room, pt coughed and expectorated mucous. Pt has history of GERD and reports belching during meals. Pt has hx of COPD, therefore cough may be due to several factors (aspirating if eating/driking when short of breath, aspiration of potential esophageal stasis, or mucous from overall lung status with COPD). Recommend upgrade diet to Dys 3, continue thin and ST intervention.     HPI HPI: 64 yo female smoker with hx COPD, RML/RLL bronchiectasis after a necrotizing PNA in 2015 with frequent bronchiectasis flares and ongoing outpt w/u of persistent RLL/RML infiltrate on CXR followed by Dr. Chase Caller. Presented to ER 12/1 with persistent dizziness and falling as well as worsening productive cough. Admitted by [facility] with ?HCAP v bronchiectasis flare. CXR with progressive infiltrates.       SLP Plan  Continue with current plan of care     Recommendations  Diet recommendations: Dysphagia 3 (mechanical soft);Thin liquid Liquids provided via: Cup;Straw Medication Administration: Whole meds with puree Supervision: Patient able to self feed Compensations: Slow rate;Small sips/bites Postural Changes and/or Swallow Maneuvers: Seated upright 90 degrees;Upright 30-60 min after meal              Oral Care Recommendations: Oral care BID Follow up Recommendations: None Plan: Continue with current plan of care   Catherine Kelley 11/05/2015, 11:06 AM   Catherine Kelley.Ed [employer] [phone]

## 2015-11-05 NOTE — Progress Notes (Signed)
Name: Catherine Kelley MRN: [id number] DOB: [birth year]    ADMISSION DATE:  11/01/2015 CONSULTATION DATE:  12/1  REFERRING MD :  Darrick Meigs ([facility])   CHIEF COMPLAINT:  PNA  BRIEF PATIENT DESCRIPTION:  64 y/o female smoker with hx COPD, RML/RLL bronchiectasis after a necrotizing PNA in 2015 with frequent bronchiectasis flares and ongoing outpt w/u of persistent RLL/RML infiltrate on CXR followed by Dr. Chase Caller.  Presented to ER 112/1 with persistent dizziness and falling as well as worsening productive cough.  Admitted by [facility] with ?HCAP vs bronchiectasis flare.  CXR with progressive infiltrates and PCCM consulted.      SIGNIFICANT EVENTS  12/01  Admit for dizziness, falls and worsening productive cough.  STUDIES:  CT head 12/1>>> neg acute, chronic sinus disease Swallow eval (MBS) 12/4>>>mild dysphagia, no obvious aspiration on this eval but confirms moderate aspiration risk.  Dys II diet recommended  HRCT 12/3>>>Significant interval worsening of cavitary infection in the right middle and right lower lobes, with now complete right lower lobe consolidation and worsening basilar right middle lobe consolidation.  Underlying severe cylindrical and varicoid bronchiectasis throughout the right middle and right lower lobes.  SUBJECTIVE: Pt reports ongoing green/yellow sputum production.  Denies chest pain, SOB    VITAL SIGNS: Temp:  [98 F (36.7 C)-98.4 F (36.9 C)] 98 F (36.7 C) (12/05 0718) Pulse Rate:  [80-92] 80 (12/05 0801) Resp:  [15-18] 16 (12/05 0801) BP: (124-139)/(58) 124/58 mmHg (12/05 0718) SpO2:  [93 %-99 %] 93 % (12/05 0801)  PHYSICAL EXAMINATION:  General:  Chronically ill appearing female, NAD   Neuro:  Awake, alert, appropriate, MAE  HEENT:  Mm dry, no JVD  Cardiovascular:  s1s2 rrr Lungs:  resps even non-labored on [place], rhonchi on R, otherwise ess clear  Abdomen:  Round, soft, +bs  Musculoskeletal:  Warm and dry, no edema   Recent Labs Lab 11/03/15 0439 11/04/15 0601  11/05/15 0556  NA 131* 131* 132*  K 3.0* 3.7 3.9  CL 92* 96* 97*  CO2 33* 30 30  BUN <5* <5* <5*  CREATININE 0.39* 0.41* 0.36*  GLUCOSE 99 94 95    Recent Labs Lab 11/01/15 1141 11/02/15 0615  HGB 10.3* 11.1*  HCT 30.6* 33.8*  WBC 9.7 8.1  PLT 183 203   Dg Swallowing Func-speech Pathology  11/04/2015  Objective Swallowing Evaluation:   Patient Details Name: Catherine Kelley MRN: [id number] Date of Birth: [birth year] Today's Date: 11/04/2015 Time: SLP Start Time (ACUTE ONLY): 0900-SLP Stop Time (ACUTE ONLY): 0930 SLP Time Calculation (min) (ACUTE ONLY): 30 min Past Medical History: Past Medical History Diagnosis Date . Hypertension    essential hypertension . Tobacco abuse    smoking about 1/2 pk of cigarettes a day . Anxiety  . Depression    psychiatrist Dr. Toy Care . Emphysematous COPD ([place])    changes in right base along with patchy areas on last x-ray . COPD (chronic obstructive pulmonary disease) ([place])  . History of echocardiogram 12/24/2006   Est. EF of 47-09% NORMAL LV SYSTOLIC FUNCTION WITH IMPAIRED RELAXATION -- MILD AORTIC SCLEROSIS -- NORMAL PALONARY ARTERY PRESSURE -- NO OLD ECHOS FOR COMPARISON -- [name], MD . History of cardiovascular stress test 08/15/2004   EF of 70% -- Normal stress cardiolite.  There is no evidence of ischemia and there is normal LV function. -- [name]. MD . Diastolic dysfunction  . Allergy  . Asthma  . Heart murmur  . Pneumonia    "several times since May  2015" (07/06/2014) . Chronic bronchitis ([place])    "get it alot; maybe not q yr" (07/06/2014) . GERD (gastroesophageal reflux disease)  Past Surgical History: Past Surgical History Procedure Laterality Date . Tubal ligation  1986 HPI: 64 yo female smoker with hx COPD, RML/RLL bronchiectasis after a necrotizing PNA in 2015 with frequent bronchiectasis flares and ongoing outpt w/u of persistent RLL/RML infiltrate on CXR followed by Dr. Chase Caller. Presented to ER 12/1 with persistent dizziness and falling as  well as worsening productive cough. Admitted by [facility] with ?HCAP v bronchiectasis flare. CXR with progressive infiltrates.  Subjective: pt denies difficulty swallowing, but does endorse frequent coughing while eating Assessment / Plan / Recommendation CHL IP CLINICAL IMPRESSIONS 11/04/2015 Therapy Diagnosis Mild pharyngeal phase dysphagia Clinical Impression Pt currently demonstrating a mild pharyngeal dysphagia characterized by a delay in swallow initiation to the level of the vallecula/ pyriform sinuses with thin liquids. No penetration/ aspiration was visualized during this study. Only trace residuals noted at the level of the CP segment. Pt did cough up a small amount of puree material about 5 seconds post-swallow- no penetrated or aspirated material visualized after this incident. No reflux to the level of the cervical esophagus or pharyngeal cavity was seen during or immediately after the swallow; however, the coughing incident does raise concern for possible esophageal involvement, especially given pt hx of GERD. Given cough incident and decreased respiratory status, aspiration risk appears moderate at this time. Recommend continuing dysphagia 2 diet, thin liquids, meds whole in puree, full supervision during meals to ensure pt is seated upright, taking small bites/ sips, sits upright 30-60 minutes after meal, encourage pt to pause/ catch breath between bites/ sips. Will continue to follow for diet tolerance/ consider advancement. Provided education to pt on results and compensatory strategies. Pt in agreement with recommendations. Impact on safety and function Moderate aspiration risk   CHL IP TREATMENT RECOMMENDATION 11/04/2015 Treatment Recommendations Therapy as outlined in treatment plan below   Prognosis 11/02/2015 Prognosis for Safe Diet Advancement (No Data) Barriers to Reach Goals -- Barriers/Prognosis Comment -- CHL IP DIET RECOMMENDATION 11/04/2015 SLP Diet Recommendations Dysphagia 2 (Fine chop)  solids;Thin liquid Liquid Administration via Cup;Straw Medication Administration Whole meds with puree Compensations Slow rate;Small sips/bites Postural Changes Seated upright at 90 degrees   CHL IP OTHER RECOMMENDATIONS 11/04/2015 Recommended Consults Consider esophageal assessment Oral Care Recommendations Oral care BID Other Recommendations --   CHL IP FOLLOW UP RECOMMENDATIONS 11/02/2015 Follow up Recommendations (No Data)   CHL IP FREQUENCY AND DURATION 11/04/2015 Speech Therapy Frequency (ACUTE ONLY) min 2x/week Treatment Duration 2 weeks      CHL IP ORAL PHASE 11/04/2015 Oral Phase WFL Oral - Pudding Teaspoon -- Oral - Pudding Cup -- Oral - Honey Teaspoon -- Oral - Honey Cup -- Oral - Nectar Teaspoon -- Oral - Nectar Cup -- Oral - Nectar Straw -- Oral - Thin Teaspoon -- Oral - Thin Cup -- Oral - Thin Straw -- Oral - Puree -- Oral - Mech Soft -- Oral - Regular -- Oral - Multi-Consistency -- Oral - Pill -- Oral Phase - Comment --  CHL IP PHARYNGEAL PHASE 11/04/2015 Pharyngeal Phase Impaired Pharyngeal- Pudding Teaspoon -- Pharyngeal -- Pharyngeal- Pudding Cup -- Pharyngeal -- Pharyngeal- Honey Teaspoon -- Pharyngeal -- Pharyngeal- Honey Cup -- Pharyngeal -- Pharyngeal- Nectar Teaspoon -- Pharyngeal -- Pharyngeal- Nectar Cup -- Pharyngeal -- Pharyngeal- Nectar Straw -- Pharyngeal -- Pharyngeal- Thin Teaspoon -- Pharyngeal -- Pharyngeal- Thin Cup Delayed swallow initiation-vallecula Pharyngeal -- Pharyngeal- Thin Straw  Delayed swallow initiation-vallecula;Delayed swallow initiation-pyriform sinuses Pharyngeal -- Pharyngeal- Puree Delayed swallow initiation-vallecula Pharyngeal -- Pharyngeal- Mechanical Soft Delayed swallow initiation-vallecula Pharyngeal -- Pharyngeal- Regular -- Pharyngeal -- Pharyngeal- Multi-consistency -- Pharyngeal -- Pharyngeal- Pill Delayed swallow initiation-vallecula Pharyngeal -- Pharyngeal Comment --  CHL IP CERVICAL ESOPHAGEAL PHASE 11/04/2015 Cervical Esophageal Phase WFL Pudding Teaspoon  -- Pudding Cup -- Honey Teaspoon -- Honey Cup -- Nectar Teaspoon -- Nectar Cup -- Nectar Straw -- Thin Teaspoon -- Thin Cup -- Thin Straw -- Puree -- Mechanical Soft -- Regular -- Multi-consistency -- Pill -- Cervical Esophageal Comment -- Kern Reap, MA, CCC-SLP 11/04/2015, 9:56 AM [id number]              ASSESSMENT / PLAN:  RF>>10.2 Anti-scl 70>>neg HIV>>neg  ANA >> MPO/PR>> ANCA>>  A/P: 64 year old female with known history of COPD and right lower & middle lobe bronchiectasis. Repeat autoimmune workup pending. Clinically patient seems to be stable although she does have worsening consolidation and some cavitation within her right middle & lower lobes. Doubt ILD.    1. Right middle & lower lobe pneumonia: Respiratory culture pending. Suspect r/t aspiration. Patient continuing on broad-spectrum antibiotics with vancomycin & Tressie Ellis. Recommend tailoring antibiotics to culture results. suspect she will need longer-term abx given cavitation.  2. Bronchiectasis: Repeat autoimmune workup pending. Suspect aspiration as the etiology. Ordering alpha-1 antitrypsin level & phenotype. Given the chronicity and recurrnet nature, may need a TCTS eval is this does not fully resolve with abx.  3. COPD: No evidence of exacerbation. Emphysema noted on high-resolution CT scan. Ordering alpha-1 antitrypsin level & phenotype.  4. Acute hypoxic respiratory failure: Recommend continuing to wean FiO2 for saturation >92%.Continuing chest PT for airway clearance with flutter valve.  Recommendations: - Follow Sputum cultures, AFB for MAI, fungal cultures  - modified diet per speech - aspiration likely cause for recurrent injury, bronchiectasis and PNA's. Will need chronic modified diet, ? Even a candidate for PEG if this keeps happening - Cont IV abx as per primary to cover HCAP, narrow per cx data - Autoimmune workup pending - ANA, ANCA both pending; HIV non-reactive, ESR 74, RF 10.2 - Could consider bronchoscopy or  CT surgery eval depending on the above workup. - aggressive pulmonary hygiene - flutter, chest vest, IS    Nickolas Madrid, NP 11/05/2015  10:36 AM Pager: [phone] or [phone]  Attending Note:  I have examined patient, reviewed labs, studies and notes. I have discussed the case with Shon Millet, and I agree with the data and plans as amended above. Await cx data and auto-immune studies. I believe that the bronchiectasis and recurrent PNA's relate to aspiration, which will need to be addressed long-term.   Baltazar Apo, MD, PhD 11/05/2015, 10:42 AM [facility] [phone] or if no answer [phone]

## 2015-11-05 NOTE — Progress Notes (Signed)
[facility] PROGRESS NOTE  JYOTI HARJU GBT:[id number] DOB: Oct 14, 1951 DOA: 11/01/2015 PCP: [name], MD  Assessment/Plan: Right middle lobe and lower lobe pneumonia Likely from aspiration, patient moderate aspiration risk started on dysphagia 2 diet. Patient seen by pulmonary, CT chest without contrast ordered. Also ordered cultures AFB for MAI, fungal cultures autoimmune  Workup. Patient was diagnosed with pneumonia in March 2016 and has been treated multiple times with IV antibiotics. CT chest on 9/15 showed widespread airspace consolidation throat admitted and lower lobe. She was seen by pulmonary on 10/10/2015 and plan was to do FOB/BAL if no improvement in next 4 weeks. She has been started on Fortaz and vancomycin for healthcare associated pneumonia. Day #5  Dysphagia Patient has moderate aspiration risk as per speech therapy evaluation, started on dysphagia 2 diet. If patient has recurrent aspiration, may consider PEG tube placement in future  Hyponatremia Patient sodium is 132, now improved. Patient on Lasix and HCTZ at home which is currently on hold.    Hypokalemia Replete. Repeat potassium is 3.7  Status post fall Likely from electrolyte abnormalities as well as hypotension. Hold diuretics, IV hydration.  DVT prophylaxis Lovenox   Code Status: Full code Family Communication: *No family present at bedside Disposition Plan: Pending improvement in respiratory status   Consultants:  Pulmonary  Procedures:  None   Antibiotics:  Vancomycin  Ceftazidime  HPI/Subjective: 64 yr old female who  has a past medical history of Hypertension; Tobacco abuse; Anxiety; Depression; Emphysematous COPD ([place]); COPD (chronic obstructive pulmonary disease) ([place]); History of echocardiogram (12/24/2006); History of cardiovascular stress test (08/15/2004); Diastolic dysfunction; Allergy; Asthma; Heart murmur; Pneumonia; Chronic bronchitis ([place]); and GERD (gastroesophageal  reflux disease). Today patient comes to the hospital after she had a fall at home hitting her head on the concrete. Patient says that she has been dizzy for a while and dizziness become worse from sitting to standing and walking. She denies chest pain or shortness of breath. She has been feeling dizzy. Patient also complains of extreme thirst. She takes Lasix as well as hydrochlorothiazide at home.  Speech therapy evaluation done and patient deemed moderate aspiration risk. Started on dysphagia 2 diet with thin liquids  This morning patient denies any shortness of breath. Still coughing up phlegm.  Objective: Filed Vitals:   11/05/15 0718 11/05/15 0801  BP: 124/58   Pulse: 87 80  Temp: 98 F (36.7 C)   Resp: 15 16    Intake/Output Summary (Last 24 hours) at 11/05/15 1132 Last data filed at 11/04/15 1800  Gross per 24 hour  Intake   1255 ml  Output      0 ml  Net   1255 ml   Filed Weights   11/01/15 1100  Weight: 58.968 kg (130 lb)    Exam:   General:  Appears in no acute distress  Cardiovascular: S1S2 RRR  Respiratory: decreased breath sounds at lung bases  Abdomen: Soft, nontender  Musculoskeletal: No cyanosis/clubbing/edema of the lower extremities  Data Reviewed: Basic Metabolic Panel:  Recent Labs Lab 11/01/15 1141 11/01/15 1859 11/02/15 0615 11/03/15 0439 11/03/15 1530 11/04/15 0601 11/05/15 0556  NA 124*  --  128* 131*  --  131* 132*  K 2.1*  --  2.7* 3.0*  --  3.7 3.9  CL 77*  --  82* 92*  --  96* 97*  CO2 39*  --  37* 33*  --  30 30  GLUCOSE 124*  --  98 99  --  94 95  BUN 6  --  <5* <5*  --  <5* <5*  CREATININE 0.49  --  0.48 0.39*  --  0.41* 0.36*  CALCIUM 8.2*  --  8.4* 8.2*  --  8.4* 8.4*  MG  --  1.9  --   --  1.7  --   --    Liver Function Tests:  Recent Labs Lab 11/02/15 0615  AST 19  ALT 14  ALKPHOS 90  BILITOT 0.7  PROT 6.6  ALBUMIN 2.0*   No results for input(s): LIPASE, AMYLASE in the last 168 hours. No results for  input(s): AMMONIA in the last 168 hours. CBC:  Recent Labs Lab 11/01/15 1141 11/02/15 0615  WBC 9.7 8.1  NEUTROABS 7.6  --   HGB 10.3* 11.1*  HCT 30.6* 33.8*  MCV 84.1 86.0  PLT 183 203   CBG: No results for input(s): GLUCAP in the last 168 hours.  Recent Results (from the past 240 hour(s))  Culture, expectorated sputum-assessment     Status: None   Collection Time: 11/02/15  1:30 PM  Result Value Ref Range Status   Specimen Description SPUTUM  Final   Special Requests NONE  Final   Sputum evaluation   Final    THIS SPECIMEN IS ACCEPTABLE. RESPIRATORY CULTURE REPORT TO FOLLOW.   Report Status 11/02/2015 FINAL  Final  AFB culture with smear     Status: None (Preliminary result)   Collection Time: 11/02/15  1:30 PM  Result Value Ref Range Status   Specimen Description SPUTUM  Final   Special Requests NONE  Final   Acid Fast Smear   Final    NO ACID FAST BACILLI SEEN Performed at [facility]    Culture   Final    CULTURE WILL BE EXAMINED FOR 6 WEEKS BEFORE ISSUING A FINAL REPORT Performed at [facility]    Report Status PENDING  Incomplete  Fungus Culture with Smear     Status: None (Preliminary result)   Collection Time: 11/02/15  1:31 PM  Result Value Ref Range Status   Specimen Description SPUTUM  Final   Special Requests NONE  Final   Fungal Smear   Final    NO YEAST OR FUNGAL ELEMENTS SEEN Performed at [facility]    Culture   Final    CULTURE IN PROGRESS FOR FOUR WEEKS Performed at [facility]    Report Status PENDING  Incomplete     Studies: Dg Swallowing Func-speech Pathology  11/04/2015  Objective Swallowing Evaluation:   Patient Details Name: CRISTA NUON MRN: [id number] Date of Birth: [birth year] Today's Date: 11/04/2015 Time: SLP Start Time (ACUTE ONLY): 0900-SLP Stop Time (ACUTE ONLY): 0930 SLP Time Calculation (min) (ACUTE ONLY): 30 min Past Medical History: Past Medical History Diagnosis Date . Hypertension     essential hypertension . Tobacco abuse    smoking about 1/2 pk of cigarettes a day . Anxiety  . Depression    psychiatrist Dr. Toy Care . Emphysematous COPD ([place])    changes in right base along with patchy areas on last x-ray . COPD (chronic obstructive pulmonary disease) ([place])  . History of echocardiogram 12/24/2006   Est. EF of 48-54% NORMAL LV SYSTOLIC FUNCTION WITH IMPAIRED RELAXATION -- MILD AORTIC SCLEROSIS -- NORMAL PALONARY ARTERY PRESSURE -- NO OLD ECHOS FOR COMPARISON -- [name], MD . History of cardiovascular stress test 08/15/2004   EF of 70% -- Normal stress cardiolite.  There is no evidence of ischemia and there  is normal LV function. -- [name]. MD . Diastolic dysfunction  . Allergy  . Asthma  . Heart murmur  . Pneumonia    "several times since May 2015" (07/06/2014) . Chronic bronchitis ([place])    "get it alot; maybe not q yr" (07/06/2014) . GERD (gastroesophageal reflux disease)  Past Surgical History: Past Surgical History Procedure Laterality Date . Tubal ligation  1986 HPI: 65 yo female smoker with hx COPD, RML/RLL bronchiectasis after a necrotizing PNA in 2015 with frequent bronchiectasis flares and ongoing outpt w/u of persistent RLL/RML infiltrate on CXR followed by Dr. Chase Caller. Presented to ER 12/1 with persistent dizziness and falling as well as worsening productive cough. Admitted by [facility] with ?HCAP v bronchiectasis flare. CXR with progressive infiltrates.  Subjective: pt denies difficulty swallowing, but does endorse frequent coughing while eating Assessment / Plan / Recommendation CHL IP CLINICAL IMPRESSIONS 11/04/2015 Therapy Diagnosis Mild pharyngeal phase dysphagia Clinical Impression Pt currently demonstrating a mild pharyngeal dysphagia characterized by a delay in swallow initiation to the level of the vallecula/ pyriform sinuses with thin liquids. No penetration/ aspiration was visualized during this study. Only trace residuals noted at the level of the CP segment. Pt  did cough up a small amount of puree material about 5 seconds post-swallow- no penetrated or aspirated material visualized after this incident. No reflux to the level of the cervical esophagus or pharyngeal cavity was seen during or immediately after the swallow; however, the coughing incident does raise concern for possible esophageal involvement, especially given pt hx of GERD. Given cough incident and decreased respiratory status, aspiration risk appears moderate at this time. Recommend continuing dysphagia 2 diet, thin liquids, meds whole in puree, full supervision during meals to ensure pt is seated upright, taking small bites/ sips, sits upright 30-60 minutes after meal, encourage pt to pause/ catch breath between bites/ sips. Will continue to follow for diet tolerance/ consider advancement. Provided education to pt on results and compensatory strategies. Pt in agreement with recommendations. Impact on safety and function Moderate aspiration risk   CHL IP TREATMENT RECOMMENDATION 11/04/2015 Treatment Recommendations Therapy as outlined in treatment plan below   Prognosis 11/02/2015 Prognosis for Safe Diet Advancement (No Data) Barriers to Reach Goals -- Barriers/Prognosis Comment -- CHL IP DIET RECOMMENDATION 11/04/2015 SLP Diet Recommendations Dysphagia 2 (Fine chop) solids;Thin liquid Liquid Administration via Cup;Straw Medication Administration Whole meds with puree Compensations Slow rate;Small sips/bites Postural Changes Seated upright at 90 degrees   CHL IP OTHER RECOMMENDATIONS 11/04/2015 Recommended Consults Consider esophageal assessment Oral Care Recommendations Oral care BID Other Recommendations --   CHL IP FOLLOW UP RECOMMENDATIONS 11/02/2015 Follow up Recommendations (No Data)   CHL IP FREQUENCY AND DURATION 11/04/2015 Speech Therapy Frequency (ACUTE ONLY) min 2x/week Treatment Duration 2 weeks      CHL IP ORAL PHASE 11/04/2015 Oral Phase WFL Oral - Pudding Teaspoon -- Oral - Pudding Cup -- Oral - Honey  Teaspoon -- Oral - Honey Cup -- Oral - Nectar Teaspoon -- Oral - Nectar Cup -- Oral - Nectar Straw -- Oral - Thin Teaspoon -- Oral - Thin Cup -- Oral - Thin Straw -- Oral - Puree -- Oral - Mech Soft -- Oral - Regular -- Oral - Multi-Consistency -- Oral - Pill -- Oral Phase - Comment --  CHL IP PHARYNGEAL PHASE 11/04/2015 Pharyngeal Phase Impaired Pharyngeal- Pudding Teaspoon -- Pharyngeal -- Pharyngeal- Pudding Cup -- Pharyngeal -- Pharyngeal- Honey Teaspoon -- Pharyngeal -- Pharyngeal- Honey Cup -- Pharyngeal -- Pharyngeal- Nectar Teaspoon --  Pharyngeal -- Pharyngeal- Nectar Cup -- Pharyngeal -- Pharyngeal- Nectar Straw -- Pharyngeal -- Pharyngeal- Thin Teaspoon -- Pharyngeal -- Pharyngeal- Thin Cup Delayed swallow initiation-vallecula Pharyngeal -- Pharyngeal- Thin Straw Delayed swallow initiation-vallecula;Delayed swallow initiation-pyriform sinuses Pharyngeal -- Pharyngeal- Puree Delayed swallow initiation-vallecula Pharyngeal -- Pharyngeal- Mechanical Soft Delayed swallow initiation-vallecula Pharyngeal -- Pharyngeal- Regular -- Pharyngeal -- Pharyngeal- Multi-consistency -- Pharyngeal -- Pharyngeal- Pill Delayed swallow initiation-vallecula Pharyngeal -- Pharyngeal Comment --  CHL IP CERVICAL ESOPHAGEAL PHASE 11/04/2015 Cervical Esophageal Phase WFL Pudding Teaspoon -- Pudding Cup -- Honey Teaspoon -- Honey Cup -- Nectar Teaspoon -- Nectar Cup -- Nectar Straw -- Thin Teaspoon -- Thin Cup -- Thin Straw -- Puree -- Mechanical Soft -- Regular -- Multi-consistency -- Pill -- Cervical Esophageal Comment -- Kern Reap, MA, CCC-SLP 11/04/2015, 9:56 AM [id number]              Scheduled Meds: . ALPRAZolam  0.5 mg Oral QHS  . antiseptic oral rinse  7 mL Mouth Rinse BID  . cefTAZidime (FORTAZ)  IV  2 g Intravenous Q8H  . divalproex  500 mg Oral QHS  . enoxaparin (LOVENOX) injection  40 mg Subcutaneous Q24H  . feeding supplement (ENSURE ENLIVE)  237 mL Oral BID BM  . guaiFENesin  600 mg Oral BID  . Influenza vac  split quadrivalent PF  0.5 mL Intramuscular Tomorrow-1000  . mometasone-formoterol  2 puff Inhalation BID  . nortriptyline  100 mg Oral QHS  . polyethylene glycol  17 g Oral Daily  . vancomycin  750 mg Intravenous Q12H   Continuous Infusions: . sodium chloride 100 mL/hr at 11/04/15 1227    Active Problems:   Hyponatremia   Pneumonia   Community acquired pneumonia   Malnutrition of moderate degree   Pulmonary emphysema ([place])    Time spent: 25 min    [facility] Pager [phone] If 7PM-7AM, please contact night-coverage at [email or web address], password [facility] 11/05/2015, 11:32 AM  LOS: 4 days

## 2015-11-06 DIAGNOSIS — E440 Moderate protein-calorie malnutrition: Secondary | ICD-10-CM

## 2015-11-06 NOTE — Progress Notes (Signed)
ANTIBIOTIC CONSULT NOTE - Follow Up  Pharmacy Consult for vancomycin + ceftazidime Indication: pneumonia   Recent Labs  11/04/15 0601 11/05/15 0556  CREATININE 0.41* 0.36*   Estimated Creatinine Clearance: 58 mL/min (by C-G formula based on Cr of 0.36).  Recent Labs  11/03/15 1647  VANCOTROUGH <4*     Assessment: 64 yo female admitted in 08/2015 with pneumonia and sepsis. Presenting now with dizziness, s/p fall, and productive cough. CT chest shows worsening infiltrate/pneumonia in right lower lobe and right middle lobe. Recurrent pneumonias related to aspiration.  Day # 6 of broad spectrum antibiotics for pneumonia  Goal of Therapy:  Vancomycin trough level 15-20 mcg/ml  Plan:  Continue IV Ceftazidime 2 g q8h Continue IV Vancomycin 750 mg iv Q 12 hours Follow up for plan and trough if needed  Thank you Anette Guarneri, PharmD  11/06/2015,11:20 AM

## 2015-11-06 NOTE — Progress Notes (Signed)
Speech Language Pathology Treatment: Dysphagia  Patient Details Name: Catherine Kelley MRN: [id number] DOB: [birth year] Today's Date: 11/06/2015 Time: 6579-0383 SLP Time Calculation (min) (ACUTE ONLY): 16 min  Assessment / Plan / Recommendation Clinical Impression  Pt eating lunch as SLP arrived and coughing. She reports coughing more with solids than liquids. SLP reviewed MBS video and did not observe esophageal stasis during brief scan ( note: MBS does not diagnose below the level of the UES) and she denies significant pharyngeal/esophageal globus sensation). Pt history of COPD and possible mico aspiration intermittently if/when short of breath. Continue Dys 3/thin. SLP reiterated controlling position, rate of eating, amount, stay upright min 30 min after meals. Will return once more while hospitalized.    HPI HPI: 64 yo female smoker with hx COPD, RML/RLL bronchiectasis after a necrotizing PNA in 2015 with frequent bronchiectasis flares and ongoing outpt w/u of persistent RLL/RML infiltrate on CXR followed by Dr. Chase Caller. Presented to ER 12/1 with persistent dizziness and falling as well as worsening productive cough. Admitted by [facility] with ?HCAP v bronchiectasis flare. CXR with progressive infiltrates.       SLP Plan  Continue with current plan of care     Recommendations  Diet recommendations: Dysphagia 3 (mechanical soft);Thin liquid Liquids provided via: Cup;Straw Medication Administration: Whole meds with puree Supervision: Patient able to self feed Compensations: Slow rate;Small sips/bites Postural Changes and/or Swallow Maneuvers: Seated upright 90 degrees;Upright 30-60 min after meal              Oral Care Recommendations: Oral care BID Follow up Recommendations: None Plan: Continue with current plan of care   [name] 11/06/2015, 1:55 PM  [name].Ed [employer] [phone]

## 2015-11-06 NOTE — Progress Notes (Signed)
[facility] PROGRESS NOTE  Catherine Kelley PNT:[id number] DOB: 06-22-1951 DOA: 11/01/2015 PCP: [name], MD  Assessment/Plan: Right middle lobe and lower lobe pneumonia Likely from aspiration, patient moderate aspiration risk started on dysphagia 2 diet. Patient seen by pulmonary, CT chest without contrast ordered. ANA negative, rheumatoid factor 10.2, AFB culture is pending acid fast smear showed no AFB, I, fungal cultures autoimmune  Workup. Patient was diagnosed with pneumonia in March 2016 and has been treated multiple times with IV antibiotics. CT chest on 9/15 showed widespread airspace consolidation throat admitted and lower lobe. She was seen by pulmonary on 10/10/2015 and plan was to do FOB/BAL if no improvement  She has been started on Fortaz and vancomycin for healthcare associated pneumonia. Day #6 If patient does not improve with antibiotics, she may need lobectomy. Discussed with Dr. Lamonte Sakai.  Dysphagia Patient has moderate aspiration risk as per speech therapy evaluation, started on dysphagia 3 diet. If patient has recurrent aspiration, may consider PEG tube placement in future  Hyponatremia Patient sodium is 132, now improved. Patient on Lasix and HCTZ at home which is currently on hold.    Hypokalemia Replete. Repeat potassium is 3.7  Status post fall Likely from electrolyte abnormalities as well as hypotension. Hold diuretics, IV hydration.  DVT prophylaxis Lovenox   Code Status: Full code Family Communication: *No family present at bedside Disposition Plan: Pending improvement in respiratory status   Consultants:  Pulmonary  Procedures:  None   Antibiotics:  Vancomycin  Ceftazidime  HPI/Subjective: 64 yr old female who  has a past medical history of Hypertension; Tobacco abuse; Anxiety; Depression; Emphysematous COPD ([place]); COPD (chronic obstructive pulmonary disease) ([place]); History of echocardiogram (12/24/2006); History of cardiovascular  stress test (08/15/2004); Diastolic dysfunction; Allergy; Asthma; Heart murmur; Pneumonia; Chronic bronchitis ([place]); and GERD (gastroesophageal reflux disease). Today patient comes to the hospital after she had a fall at home hitting her head on the concrete. Patient says that she has been dizzy for a while and dizziness become worse from sitting to standing and walking. She denies chest pain or shortness of breath. She has been feeling dizzy. Patient also complains of extreme thirst. She takes Lasix as well as hydrochlorothiazide at home.Speech therapy evaluation done and patient deemed moderate aspiration risk. Started on dysphagia 3 diet with thin liquids. Patient denies any shortness of breath this morning. Still coughing up phlegm. Sputum culture is pending   Objective: Filed Vitals:   11/06/15 0523 11/06/15 1535  BP: 131/58 150/72  Pulse: 94 106  Temp: 97.5 F (36.4 C) 98.6 F (37 C)  Resp: 18 16    Intake/Output Summary (Last 24 hours) at 11/06/15 1609 Last data filed at 11/06/15 1552  Gross per 24 hour  Intake    975 ml  Output   3375 ml  Net  -2400 ml   Filed Weights   11/01/15 1100  Weight: 58.968 kg (130 lb)    Exam:   General:  Appears in no acute distress  Cardiovascular: S1S2 RRR  Respiratory: decreased breath sounds at lung bases  Abdomen: Soft, nontender  Musculoskeletal: No cyanosis/clubbing/edema of the lower extremities  Data Reviewed: Basic Metabolic Panel:  Recent Labs Lab 11/01/15 1141 11/01/15 1859 11/02/15 0615 11/03/15 0439 11/03/15 1530 11/04/15 0601 11/05/15 0556  NA 124*  --  128* 131*  --  131* 132*  K 2.1*  --  2.7* 3.0*  --  3.7 3.9  CL 77*  --  82* 92*  --  96* 97*  CO2  39*  --  37* 33*  --  30 30  GLUCOSE 124*  --  98 99  --  94 95  BUN 6  --  <5* <5*  --  <5* <5*  CREATININE 0.49  --  0.48 0.39*  --  0.41* 0.36*  CALCIUM 8.2*  --  8.4* 8.2*  --  8.4* 8.4*  MG  --  1.9  --   --  1.7  --   --    Liver Function  Tests:  Recent Labs Lab 11/02/15 0615  AST 19  ALT 14  ALKPHOS 90  BILITOT 0.7  PROT 6.6  ALBUMIN 2.0*   No results for input(s): LIPASE, AMYLASE in the last 168 hours. No results for input(s): AMMONIA in the last 168 hours. CBC:  Recent Labs Lab 11/01/15 1141 11/02/15 0615  WBC 9.7 8.1  NEUTROABS 7.6  --   HGB 10.3* 11.1*  HCT 30.6* 33.8*  MCV 84.1 86.0  PLT 183 203   CBG: No results for input(s): GLUCAP in the last 168 hours.  Recent Results (from the past 240 hour(s))  Culture, expectorated sputum-assessment     Status: None   Collection Time: 11/02/15  1:30 PM  Result Value Ref Range Status   Specimen Description SPUTUM  Final   Special Requests NONE  Final   Sputum evaluation   Final    THIS SPECIMEN IS ACCEPTABLE. RESPIRATORY CULTURE REPORT TO FOLLOW.   Report Status 11/02/2015 FINAL  Final  AFB culture with smear     Status: None (Preliminary result)   Collection Time: 11/02/15  1:30 PM  Result Value Ref Range Status   Specimen Description SPUTUM  Final   Special Requests NONE  Final   Acid Fast Smear   Final    NO ACID FAST BACILLI SEEN Performed at [facility]    Culture   Final    CULTURE WILL BE EXAMINED FOR 6 WEEKS BEFORE ISSUING A FINAL REPORT Performed at [facility]    Report Status PENDING  Incomplete  Fungus Culture with Smear     Status: None (Preliminary result)   Collection Time: 11/02/15  1:31 PM  Result Value Ref Range Status   Specimen Description SPUTUM  Final   Special Requests NONE  Final   Fungal Smear   Final    NO YEAST OR FUNGAL ELEMENTS SEEN Performed at [facility]    Culture   Final    CULTURE IN PROGRESS FOR FOUR WEEKS Performed at [facility]    Report Status PENDING  Incomplete  Culture, respiratory (NON-Expectorated)     Status: None (Preliminary result)   Collection Time: 11/02/15  1:31 PM  Result Value Ref Range Status   Specimen Description SPUTUM  Final   Special  Requests NONE  Final   Gram Stain   Final    ABUNDANT WBC PRESENT,BOTH PMN AND MONONUCLEAR RARE SQUAMOUS EPITHELIAL CELLS PRESENT ABUNDANT GRAM NEGATIVE RODS Performed at [facility]   Final    Culture reincubated for better growth Performed at [facility]    Report Status PENDING  Incomplete     Studies: No results found.  Scheduled Meds: . ALPRAZolam  0.5 mg Oral QHS  . antiseptic oral rinse  7 mL Mouth Rinse BID  . cefTAZidime (FORTAZ)  IV  2 g Intravenous Q8H  . divalproex  500 mg Oral QHS  . enoxaparin (LOVENOX) injection  40 mg Subcutaneous  Q24H  . feeding supplement (ENSURE ENLIVE)  237 mL Oral BID BM  . guaiFENesin  600 mg Oral BID  . Influenza vac split quadrivalent PF  0.5 mL Intramuscular Tomorrow-1000  . mometasone-formoterol  2 puff Inhalation BID  . nortriptyline  100 mg Oral QHS  . polyethylene glycol  17 g Oral Daily  . vancomycin  750 mg Intravenous Q12H   Continuous Infusions: . sodium chloride 100 mL/hr at 11/05/15 2234    Active Problems:   Hyponatremia   Pneumonia   Community acquired pneumonia   Malnutrition of moderate degree   Pulmonary emphysema ([place])    Time spent: 25 min    [facility] Pager [phone] If 7PM-7AM, please contact night-coverage at [email or web address], password [facility] 11/06/2015, 4:09 PM  LOS: 5 days

## 2015-11-06 NOTE — Evaluation (Addendum)
Physical Therapy Evaluation Patient Details Name: Catherine Kelley MRN: [id number] DOB: June 22, 1951 Today's Date: 11/06/2015   History of Present Illness  Pt adm with fall, dizziness, and PNA. PMH - COPD, PNA, sepsis  Clinical Impression  Pt admitted with above diagnosis and presents to PT with functional limitations due to deficits listed below (See PT problem list). Pt needs skilled PT to maximize independence and safety to allow discharge to home. Pt denied any dizziness. Orthostatic vitals charted. Pt described lightheaded feeling prior to fall. Denied any spinning sensation.     Follow Up Recommendations No PT follow up    Equipment Recommendations  Other (comment) (To be assessed)    Recommendations for Other Services       Precautions / Restrictions Precautions Precautions: Fall      Mobility  Bed Mobility Overal bed mobility: Modified Independent                Transfers Overall transfer level: Needs assistance   Transfers: Sit to/from Stand Sit to Stand: Min guard         General transfer comment: slightly unsteady coming to stand  Ambulation/Gait Ambulation/Gait assistance: Min guard Ambulation Distance (Feet): 150 Feet Assistive device: None Gait Pattern/deviations: Step-through pattern;Decreased stride length Gait velocity: decr Gait velocity interpretation: Below normal speed for age/gender General Gait Details: Slightly unsteady and guarded gait but no overt loss of balance.  Stairs            Wheelchair Mobility    Modified Rankin (Stroke Patients Only)       Balance Overall balance assessment: Needs assistance Sitting-balance support: No upper extremity supported;Feet supported Sitting balance-Leahy Scale: Normal     Standing balance support: No upper extremity supported Standing balance-Leahy Scale: Good                               Pertinent Vitals/Pain Pain Assessment: No/denies pain    Home Living  Family/patient expects to be discharged to:: Private residence Living Arrangements: Spouse/significant other Available Help at Discharge: Family;Available PRN/intermittently Type of Home: House Home Access: Stairs to enter Entrance Stairs-Rails: Psychiatric nurse of Steps: 5 Home Layout: One level Home Equipment: Bedside commode      Prior Function Level of Independence: Independent         Comments: works full time at [employer]   Dominant Hand: Right    Extremity/Trunk Assessment   Upper Extremity Assessment: Overall WFL for tasks assessed           Lower Extremity Assessment: Generalized weakness         Communication   Communication: No difficulties  Cognition Arousal/Alertness: Awake/alert Behavior During Therapy: WFL for tasks assessed/performed Overall Cognitive Status: Within Functional Limits for tasks assessed                      General Comments      Exercises        Assessment/Plan    PT Assessment Patient needs continued PT services  PT Diagnosis Difficulty walking;Generalized weakness   PT Problem List Decreased strength;Decreased activity tolerance;Decreased balance;Decreased mobility  PT Treatment Interventions DME instruction;Gait training;Functional mobility training;Therapeutic activities;Therapeutic exercise;Balance training;Patient/family education   PT Goals (Current goals can be found in the Care Plan section) Acute Rehab PT Goals Patient Stated Goal: Return to work PT Goal Formulation: With patient Time For Goal Achievement: 11/13/15 Potential to  Achieve Goals: Good    Frequency Min 3X/week   Barriers to discharge        Co-evaluation               End of Session Equipment Utilized During Treatment: Gait belt Activity Tolerance: Patient tolerated treatment well Patient left: in chair;with call bell/phone within reach;with chair alarm set Nurse Communication: Mobility  status         Time: 1107-1140 PT Time Calculation (min) (ACUTE ONLY): 33 min   Charges:   PT Evaluation $Initial PT Evaluation Tier I: 1 Procedure PT Treatments $Gait Training: 8-22 mins   PT G Codes:        Catherine Kelley November 30, 2015, 3:14 PM [facility] PT [phone]

## 2015-11-06 NOTE — Progress Notes (Signed)
Nutrition Follow-up  DOCUMENTATION CODES:   Severe malnutrition in context of acute illness/injury  INTERVENTION:   -Continue Ensure Enlive po BID, each supplement provides 350 kcal and 20 grams of protein  NUTRITION DIAGNOSIS:   Malnutrition related to acute illness as evidenced by percent weight loss, moderate depletions of muscle mass, moderate depletion of body fat.  Ongoing  GOAL:   Patient will meet greater than or equal to 90% of their needs  Progressing  MONITOR:   PO intake, Supplement acceptance, Labs, Weight trends, Skin, I & O's  REASON FOR ASSESSMENT:   Malnutrition Screening Tool    ASSESSMENT:   Today patient comes to the hospital after she had a fall at home hitting her head on the concrete. Patient says that she has been dizzy for a while and dizziness become worse from sitting to standing and walking. She denies chest pain or shortness of breath. She has been feeling dizzy. Patient also complains of extreme thirst. She takes Lasix as well as hydrochlorothiazide at home.  Spoke with pt, who was sitting in recliner at time of visit. She looks physically better than last RD visit. She reports that her appetite is still poor, however, it is improving. Pt acknowledged that SLP has been following her and reveals that the dysphagia 3 diet is helping her better chew and swallow her foods. She states that she believes that she suspects she keeps getting pneumonia due to aspiration.   Pt reports she enjoys the Ensure supplements. RD reinforced importance of continued good intake of meals and supplements to promote healing. Pt had no nutrition related questions, but expressed appreciation for visit.   Labs reviewed: Na: 132 (on IV supplementation).   Diet Order:  DIET DYS 3 Room service appropriate?: Yes; Fluid consistency:: Thin  Skin:  Reviewed, no issues  Last BM:  11/05/15  Height:   Ht Readings from Last 1 Encounters:  11/01/15 '5\' 3"'$  (1.6 m)    Weight:    Wt Readings from Last 1 Encounters:  11/01/15 130 lb (58.968 kg)    Ideal Body Weight:  52.3 kg  BMI:  Body mass index is 23.03 kg/(m^2).  Estimated Nutritional Needs:   Kcal:  1600-1800  Protein:  75-85 grams  Fluid:  1.6-1.8 L  EDUCATION NEEDS:   Education needs addressed  Leiam Hopwood A. Jimmye Norman, RD, LDN, CDE Pager: [phone] After hours Pager: [phone]

## 2015-11-07 LAB — ANCA TITERS
C-ANCA: <1:20 {titer}
P-ANCA: <1:20 {titer}

## 2015-11-07 MED ORDER — NADOLOL 20 MG PO TABS
20.0000 mg | ORAL_TABLET | Freq: Once | ORAL | Status: AC
  Administered 2015-11-07: 20 mg via ORAL
  Filled 2015-11-07: qty 1

## 2015-11-07 MED ORDER — CLOTRIMAZOLE 2 % VA CREA
1.0000 | TOPICAL_CREAM | Freq: Every day | VAGINAL | Status: DC
  Administered 2015-11-07: 1 via VAGINAL
  Filled 2015-11-07: qty 21

## 2015-11-07 NOTE — Progress Notes (Signed)
[facility] PROGRESS NOTE  Catherine Kelley EVO:[id number] DOB: [birth year] DOA: 11/01/2015 PCP: [name], MD  Assessment/Plan: Right middle lobe and lower lobe pneumonia Likely from aspiration, patient moderate aspiration risk started on dysphagia 2 diet. Patient seen by pulmonary, CT chest without contrast ordered. ANA negative, rheumatoid factor 10.2, AFB culture is pending acid fast smear showed no AFB, I, fungal cultures autoimmune  Workup. Patient was diagnosed with pneumonia in March 2016 and has been treated multiple times with IV antibiotics. CT chest on 9/15 showed widespread airspace consolidation throat admitted and lower lobe. She was seen by pulmonary on 10/10/2015 and plan was to do FOB/BAL if no improvement  She has been started on Fortaz and vancomycin for healthcare associated pneumonia. Day #7, vanc DC Sputum cx showing pseudomonas , sensitivity pending  Follow pseudomonas sensitivities, AFB for MAI, fungal cultures  Cont IV abx as per primary to cover HCAP, d/c vanco 12/7. Hopefully will be able to d/c on PO abx (may need PICC and IV abx, 3 -4 weeks abx total with follow up CT of the chest 1-2 weeks post completion hopefully pseudomonas will be able to be treated with a PO regimen   Dysphagia Patient has moderate aspiration risk as per speech therapy evaluation, started on dysphagia 3 diet. If patient has recurrent aspiration, may consider PEG tube placement in future  Hyponatremia Patient sodium is 132, now improved. Patient on Lasix and HCTZ at home which is currently on hold.    Hypokalemia Replete. Repeat potassium is 3.7  Status post fall Likely from electrolyte abnormalities as well as hypotension. Hold diuretics, IV hydration.  DVT prophylaxis Lovenox   Code Status: Full code Family Communication: *No family present at bedside Disposition Plan: Pending improvement in respiratory status   Consultants:  Pulmonary  Procedures:  None    Antibiotics:  Vancomycin  Ceftazidime  HPI/Subjective: 64 yr old female who  has a past medical history of Hypertension; Tobacco abuse; Anxiety; Depression; Emphysematous COPD ([place]); COPD (chronic obstructive pulmonary disease) ([place]); History of echocardiogram (12/24/2006); History of cardiovascular stress test (08/15/2004); Diastolic dysfunction; Allergy; Asthma; Heart murmur; Pneumonia; Chronic bronchitis ([place]); and GERD (gastroesophageal reflux disease). Today patient comes to the hospital after she had a fall at home hitting her head on the concrete. Patient says that she has been dizzy for a while and dizziness become worse from sitting to standing and walking. She denies chest pain or shortness of breath. She has been feeling dizzy. Patient also complains of extreme thirst. She takes Lasix as well as hydrochlorothiazide at home.Speech therapy evaluation done and patient deemed moderate aspiration risk. Started on dysphagia 3 diet with thin liquids. Patient denies any shortness of breath this morning. Still coughing up phlegm. Sputum culture is pending   Objective: Filed Vitals:   11/06/15 2132 11/07/15 0533  BP: 172/75 128/68  Pulse: 98 97  Temp: 98.2 F (36.8 C) 98.5 F (36.9 C)  Resp: 19 20    Intake/Output Summary (Last 24 hours) at 11/07/15 1404 Last data filed at 11/07/15 1121  Gross per 24 hour  Intake   3600 ml  Output   4250 ml  Net   -650 ml   Filed Weights   11/01/15 1100  Weight: 58.968 kg (130 lb)    Exam:   General:  Appears in no acute distress  Cardiovascular: S1S2 RRR  Respiratory: decreased breath sounds at lung bases  Abdomen: Soft, nontender  Musculoskeletal: No cyanosis/clubbing/edema of the lower extremities  Data Reviewed: Basic Metabolic  Panel:  Recent Labs Lab 11/01/15 1141 11/01/15 1859 11/02/15 0615 11/03/15 0439 11/03/15 1530 11/04/15 0601 11/05/15 0556  NA 124*  --  128* 131*  --  131* 132*  K 2.1*  --  2.7* 3.0*  --   3.7 3.9  CL 77*  --  82* 92*  --  96* 97*  CO2 39*  --  37* 33*  --  30 30  GLUCOSE 124*  --  98 99  --  94 95  BUN 6  --  <5* <5*  --  <5* <5*  CREATININE 0.49  --  0.48 0.39*  --  0.41* 0.36*  CALCIUM 8.2*  --  8.4* 8.2*  --  8.4* 8.4*  MG  --  1.9  --   --  1.7  --   --    Liver Function Tests:  Recent Labs Lab 11/02/15 0615  AST 19  ALT 14  ALKPHOS 90  BILITOT 0.7  PROT 6.6  ALBUMIN 2.0*   No results for input(s): LIPASE, AMYLASE in the last 168 hours. No results for input(s): AMMONIA in the last 168 hours. CBC:  Recent Labs Lab 11/01/15 1141 11/02/15 0615  WBC 9.7 8.1  NEUTROABS 7.6  --   HGB 10.3* 11.1*  HCT 30.6* 33.8*  MCV 84.1 86.0  PLT 183 203   CBG: No results for input(s): GLUCAP in the last 168 hours.  Recent Results (from the past 240 hour(s))  Culture, expectorated sputum-assessment     Status: None   Collection Time: 11/02/15  1:30 PM  Result Value Ref Range Status   Specimen Description SPUTUM  Final   Special Requests NONE  Final   Sputum evaluation   Final    THIS SPECIMEN IS ACCEPTABLE. RESPIRATORY CULTURE REPORT TO FOLLOW.   Report Status 11/02/2015 FINAL  Final  AFB culture with smear     Status: None (Preliminary result)   Collection Time: 11/02/15  1:30 PM  Result Value Ref Range Status   Specimen Description SPUTUM  Final   Special Requests NONE  Final   Acid Fast Smear   Final    NO ACID FAST BACILLI SEEN Performed at [facility]    Culture   Final    CULTURE WILL BE EXAMINED FOR 6 WEEKS BEFORE ISSUING A FINAL REPORT Performed at [facility]    Report Status PENDING  Incomplete  Fungus Culture with Smear     Status: None (Preliminary result)   Collection Time: 11/02/15  1:31 PM  Result Value Ref Range Status   Specimen Description SPUTUM  Final   Special Requests NONE  Final   Fungal Smear   Final    NO YEAST OR FUNGAL ELEMENTS SEEN Performed at [facility]    Culture   Final    CULTURE IN  PROGRESS FOR FOUR WEEKS Performed at [facility]    Report Status PENDING  Incomplete  Culture, respiratory (NON-Expectorated)     Status: None (Preliminary result)   Collection Time: 11/02/15  1:31 PM  Result Value Ref Range Status   Specimen Description SPUTUM  Final   Special Requests NONE  Final   Gram Stain   Final    ABUNDANT WBC PRESENT,BOTH PMN AND MONONUCLEAR RARE SQUAMOUS EPITHELIAL CELLS PRESENT ABUNDANT GRAM NEGATIVE RODS Performed at [facility]    Culture   Final    RARE PSEUDOMONAS AERUGINOSA FEW CANDIDA ALBICANS Performed at [facility]    Report  Status PENDING  Incomplete     Studies: No results found.  Scheduled Meds: . ALPRAZolam  0.5 mg Oral QHS  . antiseptic oral rinse  7 mL Mouth Rinse BID  . cefTAZidime (FORTAZ)  IV  2 g Intravenous Q8H  . clotrimazole  1 Applicatorful Vaginal QHS  . divalproex  500 mg Oral QHS  . enoxaparin (LOVENOX) injection  40 mg Subcutaneous Q24H  . feeding supplement (ENSURE ENLIVE)  237 mL Oral BID BM  . guaiFENesin  600 mg Oral BID  . Influenza vac split quadrivalent PF  0.5 mL Intramuscular Tomorrow-1000  . mometasone-formoterol  2 puff Inhalation BID  . nortriptyline  100 mg Oral QHS  . polyethylene glycol  17 g Oral Daily   Continuous Infusions: . sodium chloride 100 mL/hr at 11/06/15 1747    Active Problems:   Hyponatremia   Pneumonia   Community acquired pneumonia   Malnutrition of moderate degree   Pulmonary emphysema ([place])    Time spent: 25 min    [facility]  [facility] Pager [phone] If 7PM-7AM, please contact night-coverage at [email or web address], password [facility] 11/07/2015, 2:04 PM  LOS: 6 days

## 2015-11-07 NOTE — Progress Notes (Signed)
Physical Therapy Treatment Patient Details Name: Catherine Kelley MRN: [id number] DOB: December 14, 1950 Today's Date: Nov 13, 2015    History of Present Illness Pt adm with fall, dizziness, and PNA. PMH - COPD, PNA, sepsis    PT Comments    Pt making good progress.  Follow Up Recommendations  No PT follow up     Equipment Recommendations  None recommended by PT    Recommendations for Other Services       Precautions / Restrictions Precautions Precautions: Fall Restrictions Weight Bearing Restrictions: No    Mobility  Bed Mobility                  Transfers Overall transfer level: Modified independent   Transfers: Sit to/from Stand Sit to Stand: Modified independent (Device/Increase time)            Ambulation/Gait Ambulation/Gait assistance: Supervision Ambulation Distance (Feet): 200 Feet Assistive device: None Gait Pattern/deviations: Step-through pattern;Decreased stride length Gait velocity: decr Gait velocity interpretation: Below normal speed for age/gender General Gait Details: Gait guarded but no significant unsteadiness   Stairs            Wheelchair Mobility    Modified Rankin (Stroke Patients Only)       Balance Overall balance assessment: Needs assistance Sitting-balance support: No upper extremity supported Sitting balance-Leahy Scale: Normal     Standing balance support: No upper extremity supported;During functional activity Standing balance-Leahy Scale: Good                      Cognition Arousal/Alertness: Awake/alert Behavior During Therapy: WFL for tasks assessed/performed Overall Cognitive Status: Within Functional Limits for tasks assessed                      Exercises      General Comments        Pertinent Vitals/Pain Pain Assessment: No/denies pain    Home Living                      Prior Function            PT Goals (current goals can now be found in the care plan section)  Acute Rehab PT Goals Patient Stated Goal: Return to work PT Goal Formulation: With patient Time For Goal Achievement: 11/13/15 Potential to Achieve Goals: Good Progress towards PT goals: Progressing toward goals    Frequency  Min 3X/week    PT Plan Current plan remains appropriate    Co-evaluation             End of Session   Activity Tolerance: Patient tolerated treatment well Patient left: in chair;with call bell/phone within reach;with chair alarm set     Time: 6433-2951 PT Time Calculation (min) (ACUTE ONLY): 13 min  Charges:  $Gait Training: 8-22 mins                    G Codes:      Mylei Brackeen 11/13/2015, 10:35 AM [facility] PT [phone]

## 2015-11-07 NOTE — Progress Notes (Signed)
11/07/15 Patient was ambulated and pulse ox no lower than 96% without oxygen.

## 2015-11-07 NOTE — Progress Notes (Signed)
Name: Catherine Kelley MRN: [id number] DOB: 1951/08/20    ADMISSION DATE:  11/01/2015 CONSULTATION DATE:  12/1  REFERRING MD :  Darrick Meigs ([facility])   CHIEF COMPLAINT:  PNA  BRIEF PATIENT DESCRIPTION:  64 y/o female smoker with hx COPD, RML/RLL bronchiectasis after a necrotizing PNA in 2015 with frequent bronchiectasis flares and ongoing outpt w/u of persistent RLL/RML infiltrate on CXR followed by Dr. Chase Caller.  Presented to ER 112/1 with persistent dizziness and falling as well as worsening productive cough.  Admitted by [facility] with ?HCAP vs bronchiectasis flare.  CXR with progressive infiltrates and PCCM consulted.      SIGNIFICANT EVENTS  12/01  Admit for dizziness, falls and worsening productive cough.  STUDIES:  CT head 12/1 >> neg acute, chronic sinus disease Swallow eval (MBS) 12/4 >> mild dysphagia, no obvious aspiration on this eval but confirms moderate aspiration risk.  Dys II diet recommended  HRCT 12/3 >> Significant interval worsening of cavitary infection in the right middle and right lower lobes, with now complete right lower lobe consolidation and worsening basilar right middle lobe consolidation.  Underlying severe cylindrical and varicoid bronchiectasis throughout the right middle and right lower lobes.  SUBJECTIVE:   Pt reports she is anxious to go home.  Asking about FMLA paperwork.  Reports coughing with eating - large amount thick yellow-white sputum in emesis basin.  Denies fevers / chills.    VITAL SIGNS: Temp:  [98.2 F (36.8 C)-98.6 F (37 C)] 98.5 F (36.9 C) (12/07 0533) Pulse Rate:  [97-106] 97 (12/07 0533) Resp:  [16-20] 20 (12/07 0533) BP: (128-172)/(68-75) 128/68 mmHg (12/07 0533) SpO2:  [91 %-99 %] 91 % (12/07 0533)  PHYSICAL EXAMINATION:  General:  Chronically ill appearing female, NAD   Neuro:  Awake, alert, appropriate, MAE  HEENT:  Mm dry, no JVD, poor dentition Cardiovascular:  s1s2 rrr Lungs:  resps even non-labored on , few posterior  crackles Abdomen:  Round, soft, +bs  Musculoskeletal:  Warm and dry, no edema   Recent Labs Lab 11/03/15 0439 11/04/15 0601 11/05/15 0556  NA 131* 131* 132*  K 3.0* 3.7 3.9  CL 92* 96* 97*  CO2 33* 30 30  BUN <5* <5* <5*  CREATININE 0.39* 0.41* 0.36*  GLUCOSE 99 94 95    Recent Labs Lab 11/01/15 1141 11/02/15 0615  HGB 10.3* 11.1*  HCT 30.6* 33.8*  WBC 9.7 8.1  PLT 183 203   No results found.  ASSESSMENT / PLAN:  RF >> 10.2 Anti-scl 70 >> neg HIV >> neg  ANA >> neg ESR >> 74  MPO/PR >> less than 9 C-ANCA >> less than 1:20 P-ANCA >> less than 1:20 Alpha-1 >>  Sputum AFB >> neg smear >> Sputum Fungal >> neg smear >>  Sputum 12/2 >> pseudomonas  A/P: 64 year old female with known history of COPD and right lower & middle lobe bronchiectasis, here w clinical PNA and worsening consolidation and some cavitation within her right middle & lower lobes.   1. Right middle & lower lobe pneumonia, recurrent ? Due to chronic aspiration. Would narrow today, d/c vanco. Suspect she will need longer-term abx given cavitation.   2. Bronchiectasis: Suspect aspiration as the etiology. Apha-1 antitrypsin level & phenotype pending. Given the chronicity and recurrent nature, may need a TCTS eval for resection if this does not fully resolve with abx.  3. COPD: No evidence of exacerbation. Emphysema noted on high-resolution CT scan.   Recommendations: - Follow pseudomonas sensitivities, AFB for MAI, fungal  cultures  - modified diet per speech - aspiration likely cause for recurrent injury, bronchiectasis and PNA's. Will need chronic modified diet, ? Even a candidate for PEG if this keeps happening - Cont IV abx as per primary to cover HCAP, d/c vanco 12/7. Hopefully will be able to d/c on PO abx (may need PICC and IV abx) - Follow autoimmune work up to completion - aggressive pulmonary hygiene - flutter, chest vest, IS - 3 weeks abx total with follow up CT of the chest 1-2 weeks post  completion.  Ultimately duration will likely be determined by clinical recovery and CT appearance given the cavitation - appt's arranged for hospital follow up and CT follow up.  Unfortunately, CT can not be ordered until she is discharged.     Noe Gens, NP-C [facility] Pgr: [phone] or if no answer [phone] 11/07/2015, 9:01 AM   Attending Note:  I have examined patient, reviewed labs, studies and notes. I have discussed the case with B Ollis, and I agree with the data and plans as amended above.  I will stop vanco today, hopefully pseudomonas will be able to be treated with a PO regimen. Duration 3-4 weeks with f/u CT chest at the end of the course to assess for improving cavity  Baltazar Apo, MD, PhD 11/07/2015, 10:44 AM [facility] [phone] or if no answer [phone]

## 2015-11-07 NOTE — Progress Notes (Signed)
Patient HR running 120-140.  Patient denies SOB, racing heart beat. Pt states she takes Nadol at home for increased HR. Paged Dr. Allyson Sabal and got orders.

## 2015-11-07 NOTE — Progress Notes (Signed)
Speech Language Pathology Treatment: Dysphagia  Patient Details Name: Catherine Kelley MRN: [id number] DOB: Jun 27, 1951 Today's Date: 11/07/2015 Time: 9381-0175 SLP Time Calculation (min) (ACUTE ONLY): 11 min  Assessment / Plan / Recommendation Clinical Impression  Prior to PO intake, pt and SLP reviewed recommended strategies, which pt then utilized throughout intake with Mod I. One delayed cough was observed across all trials. Given results of MBS, h/o GERD, and pt reports of coughing after meals (and occasionally coughing up food after meals), continue to suspect a primary esophageal dysphagia. Would continue with Dys 3 diet and thin liquids with strict adherence to aspiration and esophageal precautions. SLP to sign off.   HPI HPI: 64 yo female smoker with hx COPD, RML/RLL bronchiectasis after a necrotizing PNA in 2015 with frequent bronchiectasis flares and ongoing outpt w/u of persistent RLL/RML infiltrate on CXR followed by Dr. Chase Caller. Presented to ER 12/1 with persistent dizziness and falling as well as worsening productive cough. Admitted by [facility] with ?HCAP v bronchiectasis flare. CXR with progressive infiltrates.       SLP Plan  All goals met     Recommendations  Diet recommendations: Dysphagia 3 (mechanical soft);Thin liquid Liquids provided via: Cup;Straw Medication Administration: Whole meds with puree Supervision: Patient able to self feed Compensations: Slow rate;Small sips/bites Postural Changes and/or Swallow Maneuvers: Seated upright 90 degrees;Upright 30-60 min after meal       Oral Care Recommendations: Oral care BID Follow up Recommendations: None Plan: All goals met   Catherine Kelley, M.A. CCC-SLP [phone]  Catherine Kelley 11/07/2015, 2:59 PM

## 2015-11-08 LAB — COMPREHENSIVE METABOLIC PANEL
ALBUMIN: 1.9 g/dL — AB (ref 3.5–5.0)
ALK PHOS: 93 U/L (ref 38–126)
ALT: 22 U/L (ref 14–54)
AST: 32 U/L (ref 15–41)
Anion gap: 6 (ref 5–15)
BILIRUBIN TOTAL: 0.3 mg/dL (ref 0.3–1.2)
CO2: 26 mmol/L (ref 22–32)
CREATININE: 0.42 mg/dL — AB (ref 0.44–1.00)
Calcium: 8.7 mg/dL — ABNORMAL LOW (ref 8.9–10.3)
Chloride: 103 mmol/L (ref 101–111)
GFR calc Af Amer: >60 mL/min (ref >60)
GFR calc non Af Amer: >60 mL/min (ref >60)
GLUCOSE: 91 mg/dL (ref 65–99)
Potassium: 4.3 mmol/L (ref 3.5–5.1)
Sodium: 135 mmol/L (ref 135–145)
Total Protein: 6.7 g/dL (ref 6.5–8.1)

## 2015-11-08 LAB — CBC
HEMATOCRIT: 31.1 % — AB (ref 36.0–46.0)
HEMOGLOBIN: 9.7 g/dL — AB (ref 12.0–15.0)
MCH: 27.8 pg (ref 26.0–34.0)
MCHC: 31.2 g/dL (ref 30.0–36.0)
MCV: 89.1 fL (ref 78.0–100.0)
Platelets: 254 10*3/uL (ref 150–400)
RBC: 3.49 MIL/uL — ABNORMAL LOW (ref 3.87–5.11)
RDW: 15.7 % — ABNORMAL HIGH (ref 11.5–15.5)
WBC: 5.3 10*3/uL (ref 4.0–10.5)

## 2015-11-08 LAB — CULTURE, RESPIRATORY W GRAM STAIN

## 2015-11-08 LAB — ALPHA-1 ANTITRYPSIN PHENOTYPE: A1 ANTITRYPSIN SER: 232 mg/dL — AB (ref 90–200)

## 2015-11-08 MED ORDER — GUAIFENESIN ER 600 MG PO TB12: 600.0000 mg | ORAL_TABLET | Freq: Two times a day (BID) | ORAL | Status: DC

## 2015-11-08 MED ORDER — CLOTRIMAZOLE 2 % VA CREA: 1.0000 | TOPICAL_CREAM | Freq: Every day | VAGINAL | Status: DC

## 2015-11-08 MED ORDER — CIPROFLOXACIN HCL 500 MG PO TABS
500.0000 mg | ORAL_TABLET | Freq: Two times a day (BID) | ORAL | Status: DC
  Administered 2015-11-08: 500 mg via ORAL
  Filled 2015-11-08: qty 1

## 2015-11-08 MED ORDER — CIPROFLOXACIN HCL 500 MG PO TABS: 500.0000 mg | ORAL_TABLET | Freq: Two times a day (BID) | ORAL | Status: AC

## 2015-11-08 MED ORDER — POTASSIUM CHLORIDE 20 MEQ PO PACK: 40.0000 meq | PACK | Freq: Every day | ORAL | Status: DC

## 2015-11-08 MED ORDER — ALBUTEROL SULFATE (2.5 MG/3ML) 0.083% IN NEBU: 2.5000 mg | INHALATION_SOLUTION | RESPIRATORY_TRACT | Status: DC | PRN

## 2015-11-08 MED ORDER — ENSURE ENLIVE PO LIQD: 237.0000 mL | Freq: Two times a day (BID) | ORAL | Status: DC

## 2015-11-08 MED ORDER — NORTRIPTYLINE HCL 25 MG PO CAPS: 25.0000 mg | ORAL_CAPSULE | Freq: Every day | ORAL | Status: DC

## 2015-11-08 NOTE — Progress Notes (Signed)
[name] to be D/C'd Home per MD order.  Discussed with the patient and all questions fully answered.  VSS, Skin clean, dry and intact without evidence of skin break down, no evidence of skin tears noted. IV catheter discontinued intact. Site without signs and symptoms of complications. Dressing and pressure applied.  An After Visit Summary was printed and given to the patient. Patient received prescription.  D/c education completed with patient/family including follow up instructions, medication list, d/c activities limitations if indicated, with other d/c instructions as indicated by MD - patient able to verbalize understanding, all questions fully answered.   Patient instructed to return to ED, call 911, or call MD for any changes in condition.   Patient escorted via [place], and D/C home via private auto.  [name] 11/08/2015 1:55 PM

## 2015-11-08 NOTE — Progress Notes (Signed)
ANTIBIOTIC CONSULT NOTE - Follow Up  Pharmacy Consult for Ceftazidime Indication: pseudomonas pneumonia   Recent Labs  11/08/15 0708  WBC 5.3  HGB 9.7*  PLT 254  CREATININE 0.42*   Estimated Creatinine Clearance: 58 mL/min (by C-G formula based on Cr of 0.42). No results for input(s): VANCOTROUGH, VANCOPEAK, VANCORANDOM, GENTTROUGH, GENTPEAK, GENTRANDOM, TOBRATROUGH, TOBRAPEAK, TOBRARND, AMIKACINPEAK, AMIKACINTROU, AMIKACIN in the last 72 hours.   Assessment: 64 yo female admitted in 08/2015 with pneumonia and sepsis. Presenting now with dizziness, s/p fall, and productive cough. CT chest shows worsening infiltrate/pneumonia in right lower lobe and right middle lobe. Recurrent pneumonias related to aspiration.  Pseudomonas pneumonia sensitive to Cipro  Day # 8 of ceftazidime  Goal of Therapy:    Plan:  Continue IV Ceftazidime 2 g q8h Could switch to po cipro to complete course = 500 mg po BID   Thank you Anette Guarneri, PharmD  11/08/2015,8:35 AM

## 2015-11-08 NOTE — Discharge Summary (Signed)
Physician Discharge Summary  Catherine Kelley MRN: [id number] DOB/AGE: Jan 16, 1951 64 y.o.  PCP: [name], MD   Admit date: 11/01/2015 Discharge date: 11/08/2015  Discharge Diagnoses:     Active Problems:   Hyponatremia   Pneumonia   Community acquired pneumonia   Malnutrition of moderate degree   Pulmonary emphysema (HCC)    Follow-up recommendations Follow-up with PCP in 3-5 days , including all  additional recommended appointments as below Follow-up CBC, CMP in 3-5 days follow up CT of the chest 1 week post completion ABX; note may need to treat longer depending on CT and clinical status f/u AFB, fungal, MAI cultures  Outpt pulm f/u      Medication List    STOP taking these medications        amoxicillin-clavulanate 875-125 MG tablet  Commonly known as:  AUGMENTIN     losartan-hydrochlorothiazide 50-12.5 MG tablet  Commonly known as:  HYZAAR      TAKE these medications        albuterol (2.5 MG/3ML) 0.083% nebulizer solution  Commonly known as:  PROVENTIL  Take 3 mLs (2.5 mg total) by nebulization every 2 (two) hours as needed for wheezing.     ALPRAZolam 0.5 MG tablet  Commonly known as:  XANAX  Take 0.5 mg by mouth at bedtime.     benzonatate 100 MG capsule  Commonly known as:  TESSALON  TAKE 1 TO 2 CAPSULES BY MOUTH 3 TIMES DAILY AS NEEDED FOR COUGH     calcium-vitamin D 500-200 MG-UNIT tablet  Take 1 tablet by mouth daily.     ciprofloxacin 500 MG tablet  Commonly known as:  CIPRO  Take 1 tablet (500 mg total) by mouth 2 (two) times daily.     clotrimazole 2 % vaginal cream  Commonly known as:  GYNE-LOTRIMIN 3  Place 1 Applicatorful vaginally at bedtime.     divalproex 500 MG 24 hr tablet  Commonly known as:  DEPAKOTE ER  Take 500 mg by mouth at bedtime.     feeding supplement (ENSURE ENLIVE) Liqd  Take 237 mLs by mouth 2 (two) times daily between meals.     fluticasone 50 MCG/ACT nasal spray  Commonly known as:  FLONASE  Place 2 sprays  into both nostrils daily.     furosemide 20 MG tablet  Commonly known as:  LASIX  TAKE 2 TABLETS BY MOUTH EVERY DAY     guaiFENesin 600 MG 12 hr tablet  Commonly known as:  MUCINEX  Take 1 tablet (600 mg total) by mouth 2 (two) times daily.     HYDROcodone-acetaminophen 5-325 MG tablet  Commonly known as:  NORCO/VICODIN  Take 1 tablet by mouth every 4 (four) hours as needed for moderate pain (or cough).     ipratropium 0.03 % nasal spray  Commonly known as:  ATROVENT  Place 2 sprays into both nostrils 2 (two) times daily.     mometasone-formoterol 100-5 MCG/ACT Aero  Commonly known as:  DULERA  Take 2 puffs first thing in am and then another 2 puffs about 12 hours later.     multivitamin tablet  Take 1 tablet by mouth daily.     nadolol 40 MG tablet  Commonly known as:  CORGARD  Take 20 mg by mouth daily.     nortriptyline 25 MG capsule  Commonly known as:  PAMELOR  Take 1 capsule (25 mg total) by mouth at bedtime.     saccharomyces boulardii 250 MG capsule  Commonly known  as:  FLORASTOR  Take 1 capsule (250 mg total) by mouth 2 (two) times daily.     tiZANidine 4 MG tablet  Commonly known as:  ZANAFLEX  Take 1 tablet (4 mg total) by mouth every 6 (six) hours as needed for muscle spasms.         Discharge Condition: stable  Discharge Instructions       Discharge Instructions    Diet - low sodium heart healthy    Complete by:  As directed      Increase activity slowly    Complete by:  As directed            Allergies  Allergen Reactions  . Ceclor [Cefaclor] Hives  . Escitalopram Oxalate     Pt does not recall ever taking medication  . Sertraline Hcl     UNKNOWN  . Sulfa Drugs Cross Reactors Hives and Rash    Hives and rash      Disposition: 01-Home or Self Care   Consults: pulmonary   Significant Diagnostic Studies:  Dg Chest 2 View  11/01/2015  CLINICAL DATA:  Weakness, dizziness at work, fall, cough EXAM: CHEST  2 VIEW COMPARISON:   09/27/2015 FINDINGS: Worsening infiltrate/ pneumonia in right lower lobe and middle lobe. Cardiomediastinal silhouette is stable. Left lung is clear. IMPRESSION: Worsening infiltrate/pneumonia in right lower lobe and right middle lobe. Follow-up to resolution after appropriate treatment is recommended. Electronically Signed   By: Lahoma Crocker M.D.   On: 11/01/2015 13:11   Ct Head Wo Contrast  11/01/2015  CLINICAL DATA:  Pt fell and hit back of head and buttocks. Dizzy. States this is the second time she has fallen this week. Feels weak. Also incontinent EXAM: CT HEAD WITHOUT CONTRAST TECHNIQUE: Contiguous axial images were obtained from the base of the skull through the vertex without intravenous contrast. COMPARISON:  07/08/2015 FINDINGS: There is central and cortical atrophy. There is no intra or extra-axial fluid collection or mass lesion. The basilar cisterns and ventricles have a normal appearance. There is no CT evidence for acute infarction or hemorrhage. Bone windows demonstrate significant opacification of the left maxillary sinus, similar in appearance to the previous exam. There is improved aeration of the right maxillary sinus although there is persistent mucoperiosteal thickening. No air-fluid levels are identified. No calvarial fracture. The mastoid air cells are normally aerated. IMPRESSION: 1.  No evidence for acute intracranial abnormality. 2. Chronic sinus disease. Electronically Signed   By: Nolon Nations M.D.   On: 11/01/2015 13:42   Ct Chest Wo Contrast  11/01/2015  CLINICAL DATA:  64 year old female with history of pneumonia. Cough for the past several months. Originally diagnosed with right lower lobe pneumonia in May 2015. EXAM: CT CHEST WITHOUT CONTRAST TECHNIQUE: Multidetector CT imaging of the chest was performed following the standard protocol without IV contrast. COMPARISON:  Chest CT 08/16/2015. FINDINGS: Mediastinum/Lymph Nodes: Heart size is borderline enlarged. There is no  significant pericardial fluid, thickening or pericardial calcification. There is atherosclerosis of the thoracic aorta, the great vessels of the mediastinum and the coronary arteries, including calcified atherosclerotic plaque in the left main, left anterior descending, left circumflex and right coronary arteries. Multiple enlarged mediastinal lymph nodes in the right peritracheal nodal station measuring up to 13 mm in short axis. Enlarged subcarinal nodes measuring up to 16 mm in short axis. Probable right hilar lymphadenopathy which cannot be measured on today's noncontrast CT examination. Esophagus is unremarkable in appearance. No axillary lymphadenopathy. Lungs/Pleura: There is  severe cylindrical and cystic bronchiectasis throughout the right middle and lower lobes. Several large nodular and mass-like areas of chronic consolidation are again noted throughout the right lower lobe. Thick-walled cavitary lesion in the right lower lobe again noted, currently measuring approximately 3.5 x 2.6 cm (image 35 of series 205). Extensive thickening of the peribronchovascular interstitium throughout the right middle and lower lobe. Less severe generalized bronchial wall thickening throughout the lungs bilaterally with mild centrilobular and paraseptal emphysema. Overall, compared to prior study from 08/16/2015, the severity of bronchiectasis has increased in the right lung, while the patchy multifocal airspace consolidation has slightly decreased throughout the right lung. No new airspace consolidation in the left lung. Mild linear scarring in the left lower lobe. Upper Abdomen: Atherosclerosis. Musculoskeletal/Soft Tissues: There are no aggressive appearing lytic or blastic lesions noted in the visualized portions of the skeleton. IMPRESSION: 1. Chronic complications of necrotizing pneumonia in the right lower lobe with areas of chronic airspace consolidation throughout the right middle and lower lobes demonstrating some  mild improvement compared to prior studies, but with worsening areas of varicose and cystic bronchiectasis throughout this same distribution, multiple chronic nodular and mass-like appearing areas of consolidation, and chronic thick-walled cavitary area in the right lower lobe, as above. Given of these findings since the persistence May 2015, thoracic surgical consultation for right middle and right lower lobectomy might be considered, as these areas of chronic infection are unlikely to clear given the severity bronchiectasis throughout these regions on today's examination. At this time, there does not appear to be infectious involvement of the right upper lobe, left lower lobe or left upper lobe. 2. Mild generalized bronchial wall thickening and mild centrilobular and paraseptal emphysema; imaging findings compatible with the reported clinical history of COPD. 3. Atherosclerosis, including 3 vessel coronary artery disease. Please note that although the presence of coronary artery calcium documents the presence of coronary artery disease, the severity of this disease and any potential stenosis cannot be assessed on this non-gated CT examination. Assessment for potential risk factor modification, dietary therapy or pharmacologic therapy may be warranted, if clinically indicated. These results were called by telephone at the time of interpretation on 11/01/2015 at 6:01 pm to Dr. Melvyn Novas, who verbally acknowledged these results. Electronically Signed   By: Vinnie Langton M.D.   On: 11/01/2015 18:03   Ct Chest High Resolution  11/03/2015  CLINICAL DATA:  64 year old female inpatient with chronic bronchiectasis and persistent productive cough. EXAM: CT CHEST WITHOUT CONTRAST TECHNIQUE: Multidetector CT imaging of the chest was performed following the standard protocol without intravenous contrast. High resolution imaging of the lungs, as well as inspiratory and expiratory imaging, was performed. COMPARISON:  11/01/2015  chest CT. FINDINGS: Mediastinum/Nodes: Normal heart size. No pericardial fluid/thickening. Left anterior descending coronary atherosclerosis . Great vessels are normal in course and caliber. Normal visualized thyroid. There is scattered mild fluid throughout the thoracic esophagus. No axillary adenopathy. No appreciable change in mild to moderate right paratracheal and subcarinal mediastinal adenopathy. Stable poorly delineated mild right hilar adenopathy. Lungs/Pleura: No pneumothorax. Small right and trace left pleural effusions, increased. Mild centrilobular and paraseptal emphysema and diffuse bronchial wall thickening. There is essentially complete consolidation of the right lower lobe, with significant interval worsening. There is a 3.4 x 2.4 cm right lower lobe thick walled cavity with increasing internal fluid, not appreciably changed in size. There is underlying severe cylindrical and varicoid bronchiectasis throughout the right middle and right lower lobes, as evidenced by dense mucoid impaction  throughout the entire right middle lobe bronchial tree. There is worsening consolidation in the basilar right middle lobe. There is mild cylindrical bronchiectasis throughout the left lower lobe and minimal cylindrical bronchiectasis in the lingula. New bandlike ground-glass opacity in the basilar left lower lobe is favored to represent atelectasis. No significant pulmonary nodules or lung masses. No significant regions of subpleural reticulation, traction bronchiectasis or frank honeycombing. No appreciable air trapping on the limited expiration sequence. Upper abdomen:  Unremarkable . Musculoskeletal: No aggressive appearing focal osseous lesions. Mild degenerative changes in the thoracic spine. IMPRESSION: 1. Significant interval worsening of cavitary infection in the right middle and right lower lobes, with now complete right lower lobe consolidation and worsening basilar right middle lobe consolidation.  Underlying severe cylindrical and varicoid bronchiectasis throughout the right middle and right lower lobes. The patient has experienced multiple similar waxing and waning infections in this territory on multiple prior chest CT studies back to 07/06/2014. Differential considerations include recurrent aspiration or underlying immunodeficiency. 2. Mild left lower lobe and minimal lingular cylindrical bronchiectasis, with no acute consolidative airspace disease in the left lung or right upper lobe. 3. Mild centrilobular and paraseptal emphysema and diffuse bronchial wall thickening, suggesting COPD. 4. Small right and trace left pleural effusions, increased. 5. Stable nonspecific mild to moderate mediastinal and right hilar lymphadenopathy. 6. One vessel coronary atherosclerosis. Electronically Signed   By: Ilona Sorrel M.D.   On: 11/03/2015 11:28   Dg Swallowing Func-speech Pathology  11/04/2015  Objective Swallowing Evaluation:   Patient Details Name: DONINIQUE LWIN MRN: [id number] Date of Birth: May 10, 1951 Today's Date: 11/04/2015 Time: SLP Start Time (ACUTE ONLY): 0900-SLP Stop Time (ACUTE ONLY): 0930 SLP Time Calculation (min) (ACUTE ONLY): 30 min Past Medical History: Past Medical History Diagnosis Date . Hypertension    essential hypertension . Tobacco abuse    smoking about 1/2 pk of cigarettes a day . Anxiety  . Depression    psychiatrist Dr. Toy Care . Emphysematous COPD ([place])    changes in right base along with patchy areas on last x-ray . COPD (chronic obstructive pulmonary disease) ([place])  . History of echocardiogram 12/24/2006   Est. EF of 42-68% NORMAL LV SYSTOLIC FUNCTION WITH IMPAIRED RELAXATION -- MILD AORTIC SCLEROSIS -- NORMAL PALONARY ARTERY PRESSURE -- NO OLD ECHOS FOR COMPARISON -- [name], MD . History of cardiovascular stress test 08/15/2004   EF of 70% -- Normal stress cardiolite.  There is no evidence of ischemia and there is normal LV function. -- [name]. MD . Diastolic  dysfunction  . Allergy  . Asthma  . Heart murmur  . Pneumonia    "several times since May 2015" (07/06/2014) . Chronic bronchitis ([place])    "get it alot; maybe not q yr" (07/06/2014) . GERD (gastroesophageal reflux disease)  Past Surgical History: Past Surgical History Procedure Laterality Date . Tubal ligation  1986 HPI: 65 yo female smoker with hx COPD, RML/RLL bronchiectasis after a necrotizing PNA in 2015 with frequent bronchiectasis flares and ongoing outpt w/u of persistent RLL/RML infiltrate on CXR followed by Dr. Chase Caller. Presented to ER 12/1 with persistent dizziness and falling as well as worsening productive cough. Admitted by [facility] with ?HCAP v bronchiectasis flare. CXR with progressive infiltrates.  Subjective: pt denies difficulty swallowing, but does endorse frequent coughing while eating Assessment / Plan / Recommendation CHL IP CLINICAL IMPRESSIONS 11/04/2015 Therapy Diagnosis Mild pharyngeal phase dysphagia Clinical Impression Pt currently demonstrating a mild pharyngeal dysphagia characterized by a delay in swallow initiation  to the level of the vallecula/ pyriform sinuses with thin liquids. No penetration/ aspiration was visualized during this study. Only trace residuals noted at the level of the CP segment. Pt did cough up a small amount of puree material about 5 seconds post-swallow- no penetrated or aspirated material visualized after this incident. No reflux to the level of the cervical esophagus or pharyngeal cavity was seen during or immediately after the swallow; however, the coughing incident does raise concern for possible esophageal involvement, especially given pt hx of GERD. Given cough incident and decreased respiratory status, aspiration risk appears moderate at this time. Recommend continuing dysphagia 2 diet, thin liquids, meds whole in puree, full supervision during meals to ensure pt is seated upright, taking small bites/ sips, sits upright 30-60 minutes after meal, encourage pt  to pause/ catch breath between bites/ sips. Will continue to follow for diet tolerance/ consider advancement. Provided education to pt on results and compensatory strategies. Pt in agreement with recommendations. Impact on safety and function Moderate aspiration risk   CHL IP TREATMENT RECOMMENDATION 11/04/2015 Treatment Recommendations Therapy as outlined in treatment plan below   Prognosis 11/02/2015 Prognosis for Safe Diet Advancement (No Data) Barriers to Reach Goals -- Barriers/Prognosis Comment -- CHL IP DIET RECOMMENDATION 11/04/2015 SLP Diet Recommendations Dysphagia 2 (Fine chop) solids;Thin liquid Liquid Administration via Cup;Straw Medication Administration Whole meds with puree Compensations Slow rate;Small sips/bites Postural Changes Seated upright at 90 degrees   CHL IP OTHER RECOMMENDATIONS 11/04/2015 Recommended Consults Consider esophageal assessment Oral Care Recommendations Oral care BID Other Recommendations --   CHL IP FOLLOW UP RECOMMENDATIONS 11/02/2015 Follow up Recommendations (No Data)   CHL IP FREQUENCY AND DURATION 11/04/2015 Speech Therapy Frequency (ACUTE ONLY) min 2x/week Treatment Duration 2 weeks      CHL IP ORAL PHASE 11/04/2015 Oral Phase WFL Oral - Pudding Teaspoon -- Oral - Pudding Cup -- Oral - Honey Teaspoon -- Oral - Honey Cup -- Oral - Nectar Teaspoon -- Oral - Nectar Cup -- Oral - Nectar Straw -- Oral - Thin Teaspoon -- Oral - Thin Cup -- Oral - Thin Straw -- Oral - Puree -- Oral - Mech Soft -- Oral - Regular -- Oral - Multi-Consistency -- Oral - Pill -- Oral Phase - Comment --  CHL IP PHARYNGEAL PHASE 11/04/2015 Pharyngeal Phase Impaired Pharyngeal- Pudding Teaspoon -- Pharyngeal -- Pharyngeal- Pudding Cup -- Pharyngeal -- Pharyngeal- Honey Teaspoon -- Pharyngeal -- Pharyngeal- Honey Cup -- Pharyngeal -- Pharyngeal- Nectar Teaspoon -- Pharyngeal -- Pharyngeal- Nectar Cup -- Pharyngeal -- Pharyngeal- Nectar Straw -- Pharyngeal -- Pharyngeal- Thin Teaspoon -- Pharyngeal --  Pharyngeal- Thin Cup Delayed swallow initiation-vallecula Pharyngeal -- Pharyngeal- Thin Straw Delayed swallow initiation-vallecula;Delayed swallow initiation-pyriform sinuses Pharyngeal -- Pharyngeal- Puree Delayed swallow initiation-vallecula Pharyngeal -- Pharyngeal- Mechanical Soft Delayed swallow initiation-vallecula Pharyngeal -- Pharyngeal- Regular -- Pharyngeal -- Pharyngeal- Multi-consistency -- Pharyngeal -- Pharyngeal- Pill Delayed swallow initiation-vallecula Pharyngeal -- Pharyngeal Comment --  CHL IP CERVICAL ESOPHAGEAL PHASE 11/04/2015 Cervical Esophageal Phase WFL Pudding Teaspoon -- Pudding Cup -- Honey Teaspoon -- Honey Cup -- Nectar Teaspoon -- Nectar Cup -- Nectar Straw -- Thin Teaspoon -- Thin Cup -- Thin Straw -- Puree -- Mechanical Soft -- Regular -- Multi-consistency -- Pill -- Cervical Esophageal Comment -- Kern Reap, MA, CCC-SLP 11/04/2015, 9:56 AM [phone]             Dg Hips Bilat With Pelvis 3-4 Views  11/01/2015  CLINICAL DATA:  Fall EXAM: DG HIP (WITH OR WITHOUT PELVIS) 3-4V BILAT  COMPARISON:  None. FINDINGS: There is no evidence of hip fracture or dislocation. There is no evidence of arthropathy or other focal bone abnormality. IMPRESSION: Negative. Electronically Signed   By: Franchot Gallo M.D.   On: 11/01/2015 13:12         Filed Weights   11/01/15 1100  Weight: 58.968 kg (130 lb)     Microbiology: Recent Results (from the past 240 hour(s))  Culture, expectorated sputum-assessment     Status: None   Collection Time: 11/02/15  1:30 PM  Result Value Ref Range Status   Specimen Description SPUTUM  Final   Special Requests NONE  Final   Sputum evaluation   Final    THIS SPECIMEN IS ACCEPTABLE. RESPIRATORY CULTURE REPORT TO FOLLOW.   Report Status 11/02/2015 FINAL  Final  AFB culture with smear     Status: None (Preliminary result)   Collection Time: 11/02/15  1:30 PM  Result Value Ref Range Status   Specimen Description SPUTUM  Final   Special Requests  NONE  Final   Acid Fast Smear   Final    NO ACID FAST BACILLI SEEN Performed at [facility]    Culture   Final    CULTURE WILL BE EXAMINED FOR 6 WEEKS BEFORE ISSUING A FINAL REPORT Performed at [facility]    Report Status PENDING  Incomplete  Fungus Culture with Smear     Status: None (Preliminary result)   Collection Time: 11/02/15  1:31 PM  Result Value Ref Range Status   Specimen Description SPUTUM  Final   Special Requests NONE  Final   Fungal Smear   Final    NO YEAST OR FUNGAL ELEMENTS SEEN Performed at [facility]    Culture   Final    YEAST ISOLATED;ID TO FOLLOW Performed at [facility]    Report Status PENDING  Incomplete  Culture, respiratory (NON-Expectorated)     Status: None   Collection Time: 11/02/15  1:31 PM  Result Value Ref Range Status   Specimen Description SPUTUM  Final   Special Requests NONE  Final   Gram Stain   Final    ABUNDANT WBC PRESENT,BOTH PMN AND MONONUCLEAR RARE SQUAMOUS EPITHELIAL CELLS PRESENT ABUNDANT GRAM NEGATIVE RODS Performed at [facility]    Culture   Final    RARE PSEUDOMONAS AERUGINOSA FEW CANDIDA ALBICANS Performed at [facility]    Report Status 11/08/2015 FINAL  Final   Organism ID, Bacteria PSEUDOMONAS AERUGINOSA  Final      Susceptibility   Pseudomonas aeruginosa - MIC*    CEFEPIME 4 SENSITIVE Sensitive     CEFTAZIDIME <=1 SENSITIVE Sensitive     CIPROFLOXACIN 1 SENSITIVE Sensitive     GENTAMICIN 4 SENSITIVE Sensitive     IMIPENEM 1 SENSITIVE Sensitive     PIP/TAZO 8 SENSITIVE Sensitive     TOBRAMYCIN <=1 SENSITIVE Sensitive     * RARE PSEUDOMONAS AERUGINOSA       Blood Culture    Component Value Date/Time   SDES SPUTUM 11/02/2015 1331   SDES SPUTUM 11/02/2015 1331   SPECREQUEST NONE 11/02/2015 1331   SPECREQUEST NONE 11/02/2015 1331   CULT  11/02/2015 1331    YEAST ISOLATED;ID TO FOLLOW Performed at [place]  11/02/2015 1331     RARE PSEUDOMONAS AERUGINOSA FEW CANDIDA ALBICANS Performed at [place] PENDING 11/02/2015 1331   REPTSTATUS 11/08/2015 FINAL 11/02/2015 1331  Labs: Results for orders placed or performed during the hospital encounter of 11/01/15 (from the past 48 hour(s))  CBC     Status: Abnormal   Collection Time: 11/08/15  7:08 AM  Result Value Ref Range   WBC 5.3 4.0 - 10.5 K/uL   RBC 3.49 (L) 3.87 - 5.11 MIL/uL   Hemoglobin 9.7 (L) 12.0 - 15.0 g/dL   HCT 31.1 (L) 36.0 - 46.0 %   MCV 89.1 78.0 - 100.0 fL   MCH 27.8 26.0 - 34.0 pg   MCHC 31.2 30.0 - 36.0 g/dL   RDW 15.7 (H) 11.5 - 15.5 %   Platelets 254 150 - 400 K/uL  Comprehensive metabolic panel     Status: Abnormal   Collection Time: 11/08/15  7:08 AM  Result Value Ref Range   Sodium 135 135 - 145 mmol/L   Potassium 4.3 3.5 - 5.1 mmol/L   Chloride 103 101 - 111 mmol/L   CO2 26 22 - 32 mmol/L   Glucose, Bld 91 65 - 99 mg/dL   BUN <5 (L) 6 - 20 mg/dL   Creatinine, Ser 0.42 (L) 0.44 - 1.00 mg/dL   Calcium 8.7 (L) 8.9 - 10.3 mg/dL   Total Protein 6.7 6.5 - 8.1 g/dL   Albumin 1.9 (L) 3.5 - 5.0 g/dL   AST 32 15 - 41 U/L   ALT 22 14 - 54 U/L   Alkaline Phosphatase 93 38 - 126 U/L   Total Bilirubin 0.3 0.3 - 1.2 mg/dL   GFR calc non Af Amer >60 >60 mL/min   GFR calc Af Amer >60 >60 mL/min    Comment: (NOTE) The eGFR has been calculated using the CKD EPI equation. This calculation has not been validated in all clinical situations. eGFR's persistently <60 mL/min signify possible Chronic Kidney Disease.    Anion gap 6 5 - 15     Lipid Panel  No results found for: CHOL, TRIG, HDL, CHOLHDL, VLDL, LDLCALC, LDLDIRECT   Lab Results  Component Value Date   HGBA1C 5.7* 08/16/2015     Lab Results  Component Value Date   CREATININE 0.42* 11/08/2015     HPI : 64 y/o female smoker with hx COPD, RML/RLL bronchiectasis after a necrotizing PNA in 2015 with frequent bronchiectasis flares and ongoing  outpt w/u of persistent RLL/RML infiltrate on CXR followed by Dr. Chase Caller. Presented to ER 112/1 with persistent dizziness and falling as well as worsening productive cough. Admitted by [facility] with ?HCAP vs bronchiectasis flare. CXR with progressive infiltrates and PCCM consulted. Modified barium swallow on 12/4 showed mild dysphagia without any obvious aspiration. High-resolution CT on 12/3 showed significant interval worsening of the cavitary infection of the right middle and right lower lobe.Underlying severe cylindrical and varicoid bronchiectasis throughout the right middle and right lower lobes.   HOSPITAL COURSE: *  Recurrent RML/RLL PNA -- pseudomonas on sputum culture. Treated with ceftazidime 2 g IV every 8 hours, 12/1-12/8, also treated with IV vancomycin from 12/1-12/7. Switched to Cipro PO   treat x 4 weeks minimum.  - follow up CT of the chest 1 week post completion ABX;   may need to treat longer depending on CT and clinical status - f/u AFB, fungal, MAI cultures  - Outpt pulm f/u - appt made.  - ok for d/c from pulm standpoint    Bronchiectasis - suspect aspiration as etiology.  - Apha-1 antitrypsin MM (normal) - Autoimmune work up negative - modified diet per speech - Will need  chronic modified diet, ? Even a candidate for PEG if this keeps happening - aggressive pulmonary hygiene - flutter, IS  COPD - no evidence exacerbation  - dulera, PRN albuterol   Dysphagia Patient has moderate aspiration risk as per speech therapy evaluation, started on dysphagia 3 diet. If patient has recurrent aspiration, may consider PEG tube placement in future  Hyponatremia Patient sodium is 132, now improved. Patient on Lasix and HCTZ at home which is currently on hold.  Sodium 135 by discharge, to resume Lasix, hold HCTZ Patient will need close electrolyte monitoring  Hypokalemia Potassium as low as 2.7, repleted, will need potassium supplementation   Status post  fall Likely from electrolyte abnormalities as well as hypotension.    Discharge Exam:    Blood pressure 116/51, pulse 80, temperature 98.4 F (36.9 C), temperature source Oral, resp. rate 18, height '5\' 3"'  (1.6 m), weight 58.968 kg (130 lb), SpO2 94 %. General: Chronically ill appearing female, NAD sitting up in chair  Neuro: Awake, alert, appropriate, MAE  HEENT: Mm dry, no JVD, poor dentition Cardiovascular: s1s2 rrr Lungs: resps even non-labored on [place], few posterior crackles Abdomen: Round, soft, +bs  Musculoskeletal: Warm and dry, no edema     Follow-up Information    Follow up with PARRETT,TAMMY, NP On 12/17/2015.   Specialty:  Pulmonary Disease   Why:  Appt for follow up regarding CT at 11:30   Contact information:   [address] [place] [address] [phone]       Follow up with [name], MD.   Specialty:  Family Medicine   Contact information:   [place] [address] [phone]       Follow up with [name]., MD. Schedule an appointment as soon as possible for a visit in 3 weeks.   Specialty:  Pulmonary Disease   Contact information:   [address] [place] [address] [phone]       Signed: Reyne Dumas 11/08/2015, 12:38 PM        Time spent >45 mins

## 2015-11-08 NOTE — Care Management Note (Signed)
Case Management Note  Patient Details  Name: Catherine Kelley MRN: [id number] Date of Birth: Aug 02, 1951  Subjective/Objective:                 Patient from home, lives with husband, admitted for hyponatremia and recurrent PNA.    Action/Plan:  CM will continue to follow and offer resources as needed. 11-08-15 No CM needs identified at this time. DC to home self care    Expected Discharge Date:                  Expected Discharge Plan:  Home/Self Care  In-House Referral:     Discharge planning Services  CM Consult  Post Acute Care Choice:    Choice offered to:     DME Arranged:    DME Agency:     HH Arranged:    [place] Agency:     Status of Service:  Completed, signed off  Medicare Important Message Given:    Date Medicare IM Given:    Medicare IM give by:    Date Additional Medicare IM Given:    Additional Medicare Important Message give by:     If discussed at [place] of Stay Meetings, dates discussed:    Additional Comments:  Carles Collet, RN 11/08/2015, 1:35 PM

## 2015-11-08 NOTE — Progress Notes (Signed)
Name: Catherine Kelley MRN: [id number] DOB: Sep 30, 1951    ADMISSION DATE:  11/01/2015 CONSULTATION DATE:  12/1  REFERRING MD :  Darrick Meigs ([facility])   CHIEF COMPLAINT:  PNA  BRIEF PATIENT DESCRIPTION:  64 y/o female smoker with hx COPD, RML/RLL bronchiectasis after a necrotizing PNA in 2015 with frequent bronchiectasis flares and ongoing outpt w/u of persistent RLL/RML infiltrate on CXR followed by Dr. Chase Caller.  Presented to ER 112/1 with persistent dizziness and falling as well as worsening productive cough.  Admitted by [facility] with ?HCAP vs bronchiectasis flare.  CXR with progressive infiltrates and PCCM consulted.      SIGNIFICANT EVENTS  12/01  Admit for dizziness, falls and worsening productive cough.  STUDIES:  CT head 12/1 >> neg acute, chronic sinus disease Swallow eval (MBS) 12/4 >> mild dysphagia, no obvious aspiration on this eval but confirms moderate aspiration risk.  Dys II diet recommended  HRCT 12/3 >> Significant interval worsening of cavitary infection in the right middle and right lower lobes, with now complete right lower lobe consolidation and worsening basilar right middle lobe consolidation.  Underlying severe cylindrical and varicoid bronchiectasis throughout the right middle and right lower lobes.  SUBJECTIVE:   Pt reports she is anxious to go home.  Still coughing with thick sputum.  Otherwise feeling a bit better.  Really wants to go home.     VITAL SIGNS: Temp:  [97.9 F (36.6 C)-98.4 F (36.9 C)] 98.4 F (36.9 C) (12/08 0556) Pulse Rate:  [80-100] 80 (12/08 0556) Resp:  [16-18] 18 (12/08 0556) BP: (116-163)/(51-75) 116/51 mmHg (12/08 0556) SpO2:  [91 %-96 %] 93 % (12/08 0556)  PHYSICAL EXAMINATION:  General:  Chronically ill appearing female, NAD sitting up in chair  Neuro:  Awake, alert, appropriate, MAE  HEENT:  Mm dry, no JVD, poor dentition Cardiovascular:  s1s2 rrr Lungs:  resps even non-labored on [place], few posterior crackles Abdomen:  Round, soft, +bs    Musculoskeletal:  Warm and dry, no edema   Recent Labs Lab 11/04/15 0601 11/05/15 0556 11/08/15 0708  NA 131* 132* 135  K 3.7 3.9 4.3  CL 96* 97* 103  CO2 _0 BUN <5* <5* <5*  CREATININE 0.41* 0.36* 0.42*  GLUCOSE 94 95 91    Recent Labs Lab 11/01/15 1141 11/02/15 0615 11/08/15 0708  HGB 10.3* 11.1* 9.7*  HCT 30.6* 33.8* 31.1*  WBC 9.7 8.1 5.3  PLT 183 203 254   No results found.  ASSESSMENT / PLAN:  RF >> 10.2 Anti-scl 70 >> neg HIV >> neg  ANA >> neg ESR >> 74  MPO/PR >> less than 9 C-ANCA >> less than 1:20 P-ANCA >> less than 1:20 Alpha-1 >>  Sputum AFB >> neg smear >> Sputum Fungal >> neg smear >>  Sputum 12/2 >> pseudomonas (sens cipro)   A/P: 64 year old female with known history of COPD and right lower & middle lobe bronchiectasis, here w clinical PNA and worsening consolidation and some cavitation within her right middle & lower lobes.   Recurrent RML/RLL PNA -- pseudomonas on sputum culture.   - Will narrow to Cipro PO - Would treat x 4 weeks minimum.  - follow up CT of the chest 1 week post completion ABX; note may need to treat longer depending on CT and clinical status - f/u AFB, fungal, MAI cultures  - Outpt pulm f/u - appt made.   - ok for d/c from pulm standpoint as long as she tolerates the Cipro  Bronchiectasis - suspect aspiration as etiology.    - Apha-1 antitrypsin MM (normal) - Autoimmune work up negative - modified diet per speech - Will need chronic modified diet, ? Even a candidate for PEG if this keeps happening - aggressive pulmonary hygiene - flutter, IS  COPD - no evidence exacerbation  - dulera, PRN albuterol   PCCM signing off w planned outpt f/u, please call back if needed.    Nickolas Madrid, NP 11/08/2015  10:03 AM Pager: [phone] or [phone]   Attending Note:  I have examined patient, reviewed labs, studies and notes. I have discussed the case with Shon Millet, and I agree with the data and  plans as amended above.   Baltazar Apo, MD, PhD 11/08/2015, 11:26 AM [facility] [phone] or if no answer [phone]

## 2015-11-13 DIAGNOSIS — Z0271 Encounter for disability determination: Secondary | ICD-10-CM

## 2015-11-21 ENCOUNTER — Ambulatory Visit: Payer: Self-pay | Admitting: Internal Medicine

## 2015-11-27 ENCOUNTER — Telehealth: Payer: Self-pay | Admitting: Emergency Medicine

## 2015-11-27 NOTE — Telephone Encounter (Signed)
Spoke with pt. States that some disability paperwork was to be sent to [facility] to fill out. Advised her that I have not seen this paperwork. She is going to have Elmyra Ricks resend this information.

## 2015-11-30 ENCOUNTER — Telehealth: Payer: Self-pay | Admitting: Emergency Medicine

## 2015-11-30 NOTE — Telephone Encounter (Signed)
This is MR pt and Daneil Dan has the paperwork. This is in MR look at. I called spoke with Elmyra Ricks and made her aware. Nothing further needed

## 2015-12-04 LAB — FUNGUS CULTURE W SMEAR: FUNGAL SMEAR: NONE SEEN

## 2015-12-05 ENCOUNTER — Telehealth: Payer: Self-pay | Admitting: Internal Medicine

## 2015-12-05 NOTE — Telephone Encounter (Signed)
Please update Catherine Kelley

## 2015-12-05 NOTE — Telephone Encounter (Signed)
Patient notified. To Catherine Kelley for follow up

## 2015-12-05 NOTE — Telephone Encounter (Signed)
Received forms about 1 week ago from 1.4.2017 however, the forms had Dr. Lamonte Sakai, Dr. Vaughan Browner, and TP's name on them. MR is the pt's primary pulmonologist. Forms were sent back to medical records on the same day they were received informing them to change the disability forms to reflect MR as the physician on file. I have not received the forms back from medical records. I called and left message with medical records today (12/05/2015) to inquire where the forms are now and how soon can they get them to [name]. MR aware.

## 2015-12-05 NOTE — Telephone Encounter (Signed)
Patient says that disability papers from her job and her short term disability insurance company were sent to MR to complete and send back, both places have not received their forms and her claim has been denied since the papers were not received in time.  Patient says that she called them and they advised her that if the papers are sent this week that they can reverse the denial.   Patient is requesting that the papers be sent ASAP.

## 2015-12-05 NOTE — Telephone Encounter (Signed)
Catherine Kelley (cc to triage)   pls give form to me 12/05/2015.   Dr. Brand Males, M.D., [facility].C.P Pulmonary and Critical Care Medicine Staff Physician [facility] Pager: [phone], If no answer or between  15:00h - 7:00h: call [phone]  12/05/2015 3:25 PM

## 2015-12-06 NOTE — Telephone Encounter (Signed)
Can you somehow get it to [facility] in morning? This way i can get it back by afternoon to [place]

## 2015-12-06 NOTE — Telephone Encounter (Signed)
Received disability forms, they have been placed in MR's cubby.   Will forward to MR as FYI.

## 2015-12-06 NOTE — Telephone Encounter (Signed)
Forms are being faxed to our office and will be given to elise.

## 2015-12-07 NOTE — Telephone Encounter (Signed)
Spoke with Bonner Puna and forms will be taken to 38m50 today (12/07/2015) as requested by MR. Will await the return of the forms to send back to medical records.

## 2015-12-11 NOTE — Telephone Encounter (Signed)
LMTCB for pt to see when she is wanting to go back to work and what she feels are her limitations at work. Will need to speak to pt directly.

## 2015-12-11 NOTE — Telephone Encounter (Signed)
Forms were taken to MR by Lanny Hurst. One form was completed but the other was not, currently in the process of completing the second form. Will update chart when records have been sent to medical records.

## 2015-12-12 ENCOUNTER — Ambulatory Visit (INDEPENDENT_AMBULATORY_CARE_PROVIDER_SITE_OTHER): Payer: Managed Care, Other (non HMO) | Admitting: Family Medicine

## 2015-12-12 ENCOUNTER — Ambulatory Visit (INDEPENDENT_AMBULATORY_CARE_PROVIDER_SITE_OTHER): Payer: Managed Care, Other (non HMO)

## 2015-12-12 VITALS — BP 124/78 | HR 86 | Temp 98.4°F | Resp 17 | Ht 63.0 in | Wt 131.0 lb

## 2015-12-12 DIAGNOSIS — R350 Frequency of micturition: Secondary | ICD-10-CM

## 2015-12-12 DIAGNOSIS — J471 Bronchiectasis with (acute) exacerbation: Secondary | ICD-10-CM

## 2015-12-12 DIAGNOSIS — E871 Hypo-osmolality and hyponatremia: Secondary | ICD-10-CM | POA: Diagnosis not present

## 2015-12-12 DIAGNOSIS — R509 Fever, unspecified: Secondary | ICD-10-CM | POA: Diagnosis not present

## 2015-12-12 DIAGNOSIS — J189 Pneumonia, unspecified organism: Secondary | ICD-10-CM | POA: Diagnosis not present

## 2015-12-12 DIAGNOSIS — M5416 Radiculopathy, lumbar region: Secondary | ICD-10-CM

## 2015-12-12 DIAGNOSIS — R0981 Nasal congestion: Secondary | ICD-10-CM | POA: Diagnosis not present

## 2015-12-12 DIAGNOSIS — M5417 Radiculopathy, lumbosacral region: Secondary | ICD-10-CM | POA: Diagnosis not present

## 2015-12-12 DIAGNOSIS — R05 Cough: Secondary | ICD-10-CM | POA: Diagnosis not present

## 2015-12-12 LAB — POCT CBC
Granulocyte percent: 74.8 %G (ref 37–80)
HCT, POC: 35 % — AB (ref 37.7–47.9)
Hemoglobin: 12.1 g/dL — AB (ref 12.2–16.2)
LYMPH, POC: 2.1 (ref 0.6–3.4)
MCH: 28.6 pg (ref 27–31.2)
MCHC: 34.7 g/dL (ref 31.8–35.4)
MCV: 82.6 fL (ref 80–97)
MID (CBC): 0.6 (ref 0–0.9)
MPV: 6.5 fL (ref 0–99.8)
PLATELET COUNT, POC: 249 10*3/uL (ref 142–424)
POC Granulocyte: 8.1 — AB (ref 2–6.9)
POC LYMPH %: 19.3 % (ref 10–50)
POC MID %: 5.9 % (ref 0–12)
RBC: 4.23 M/uL (ref 4.04–5.48)
RDW, POC: 15.3 %
WBC: 10.8 10*3/uL — AB (ref 4.6–10.2)

## 2015-12-12 LAB — POCT URINALYSIS DIP (MANUAL ENTRY)
Bilirubin, UA: NEGATIVE
Blood, UA: NEGATIVE
Glucose, UA: NEGATIVE
Ketones, POC UA: NEGATIVE
LEUKOCYTES UA: NEGATIVE
Nitrite, UA: NEGATIVE
PROTEIN UA: NEGATIVE
Spec Grav, UA: 1.01
UROBILINOGEN UA: 0.2
pH, UA: 6

## 2015-12-12 LAB — POC MICROSCOPIC URINALYSIS (UMFC): Mucus: ABSENT

## 2015-12-12 LAB — POCT INFLUENZA A/B
INFLUENZA B, POC: NEGATIVE
Influenza A, POC: NEGATIVE

## 2015-12-12 MED ORDER — HYDROCODONE-ACETAMINOPHEN 5-325 MG PO TABS
1.0000 | ORAL_TABLET | Freq: Four times a day (QID) | ORAL | Status: DC | PRN
Start: 2015-12-12 — End: 2016-03-11

## 2015-12-12 MED ORDER — TIZANIDINE HCL 4 MG PO TABS: 4.0000 mg | ORAL_TABLET | Freq: Three times a day (TID) | ORAL | Status: DC | PRN

## 2015-12-12 MED ORDER — CEFDINIR 300 MG PO CAPS: 600.0000 mg | ORAL_CAPSULE | Freq: Every day | ORAL | Status: DC

## 2015-12-12 NOTE — Telephone Encounter (Signed)
Pt returned call. Pt states she was already informed that her forms have been turned in under Dr. Agustina Caroli name. Advised pt that I will call the disability rep and see if the forms I have are even needed. Saintclair Halsted at [phone] and left a VM. Will await call back.

## 2015-12-12 NOTE — Progress Notes (Signed)
Subjective:    Patient ID: Catherine Kelley, female    DOB: [birth year], 65 y.o.   MRN: [id number]  12/12/2015  Fever; Hospitalization Follow-up; Cough; and Medication Refill   HPI This 65 y.o. female presents for [facility] for recent seven day admission for community acquired pneumonia and bronchiectasis flare.  Prescribed Cipro for one month.  Advised to avoid nortriptyline while taking Cipro; decreased dose to '25mg'$  nortiptyline.  Very rough withdrawals.  Was previously maintained on '100mg'$ ; now had decreased to '50mg'$  daily.  The cough never completely goes away; continues to cough up mucous; coughed up blood last week; then went to pink; now tan sputum. Also had high fever for past two nights; could not find thermometer.  Finished Cipro a week ago.    +fever; +chills; +sweats.  +HA for one day.  No sore throat; no ear pain.  Throat is scratchy; +hoarseness; mucous in throat.  No nasal congestion; mild rhinorrhea; taking store brand benadryl one at nighttime and none during the day.  No SOB.  Increase in cough for the past one week since finishing CIpro.  No n/v/d.  Husband is also sick.     Admit date: 11/01/2015 Discharge date: 11/08/2015  Discharge Diagnoses:    Active Problems:  Hyponatremia  Pneumonia  Community acquired pneumonia  Malnutrition of moderate degree  Pulmonary emphysema (HCC)    Follow-up recommendations Follow-up with PCP in 3-5 days , including all additional recommended appointments as below Follow-up CBC, CMP in 3-5 days follow up CT of the chest 1 week post completion ABX; note may need to treat longer depending on CT and clinical status f/u AFB, fungal, MAI cultures  Outpt pulm f/u      Medication List    STOP taking these medications       amoxicillin-clavulanate 875-125 MG tablet  Commonly known as: AUGMENTIN     losartan-hydrochlorothiazide 50-12.5 MG tablet  Commonly known as: HYZAAR      TAKE these  medications       albuterol (2.5 MG/3ML) 0.083% nebulizer solution  Commonly known as: PROVENTIL  Take 3 mLs (2.5 mg total) by nebulization every 2 (two) hours as needed for wheezing.     ALPRAZolam 0.5 MG tablet  Commonly known as: XANAX  Take 0.5 mg by mouth at bedtime.     benzonatate 100 MG capsule  Commonly known as: TESSALON  TAKE 1 TO 2 CAPSULES BY MOUTH 3 TIMES DAILY AS NEEDED FOR COUGH     calcium-vitamin D 500-200 MG-UNIT tablet  Take 1 tablet by mouth daily.     ciprofloxacin 500 MG tablet  Commonly known as: CIPRO  Take 1 tablet (500 mg total) by mouth 2 (two) times daily.     clotrimazole 2 % vaginal cream  Commonly known as: GYNE-LOTRIMIN 3  Place 1 Applicatorful vaginally at bedtime.     divalproex 500 MG 24 hr tablet  Commonly known as: DEPAKOTE ER  Take 500 mg by mouth at bedtime.     feeding supplement (ENSURE ENLIVE) Liqd  Take 237 mLs by mouth 2 (two) times daily between meals.     fluticasone 50 MCG/ACT nasal spray  Commonly known as: FLONASE  Place 2 sprays into both nostrils daily.     furosemide 20 MG tablet  Commonly known as: LASIX  TAKE 2 TABLETS BY MOUTH EVERY DAY     guaiFENesin 600 MG 12 hr tablet  Commonly known as: MUCINEX  Take 1 tablet (600 mg total)  by mouth 2 (two) times daily.     HYDROcodone-acetaminophen 5-325 MG tablet  Commonly known as: NORCO/VICODIN  Take 1 tablet by mouth every 4 (four) hours as needed for moderate pain (or cough).     ipratropium 0.03 % nasal spray  Commonly known as: ATROVENT  Place 2 sprays into both nostrils 2 (two) times daily.     mometasone-formoterol 100-5 MCG/ACT Aero  Commonly known as: DULERA  Take 2 puffs first thing in am and then another 2 puffs about 12 hours later.     multivitamin tablet  Take 1 tablet by mouth daily.     nadolol 40 MG tablet  Commonly known as: CORGARD  Take 20 mg by  mouth daily.     nortriptyline 25 MG capsule  Commonly known as: PAMELOR  Take 1 capsule (25 mg total) by mouth at bedtime.     saccharomyces boulardii 250 MG capsule  Commonly known as: FLORASTOR  Take 1 capsule (250 mg total) by mouth 2 (two) times daily.     tiZANidine 4 MG tablet  Commonly known as: ZANAFLEX  Take 1 tablet (4 mg total) by mouth every 6 (six) hours as needed for muscle spasms.         Discharge Condition: stable  Discharge Instructions       Discharge Instructions    Diet - low sodium heart healthy  Complete by: As directed      Increase activity slowly  Complete by: As directed            Allergies  Allergen Reactions  . Ceclor [Cefaclor] Hives  . Escitalopram Oxalate     Pt does not recall ever taking medication  . Sertraline Hcl     UNKNOWN  . Sulfa Drugs Cross Reactors Hives and Rash    Hives and rash      Disposition: 01-Home or Self Care   Consults: pulmonary   Significant Diagnostic Studies:   Imaging Results    Dg Chest 2 View  11/01/2015 CLINICAL DATA: Weakness, dizziness at work, fall, cough EXAM: CHEST 2 VIEW COMPARISON: 09/27/2015 FINDINGS: Worsening infiltrate/ pneumonia in right lower lobe and middle lobe. Cardiomediastinal silhouette is stable. Left lung is clear. IMPRESSION: Worsening infiltrate/pneumonia in right lower lobe and right middle lobe. Follow-up to resolution after appropriate treatment is recommended. Electronically Signed By: Lahoma Crocker M.D. On: 11/01/2015 13:11   Ct Head Wo Contrast  11/01/2015 CLINICAL DATA: Pt fell and hit back of head and buttocks. Dizzy. States this is the second time she has fallen this week. Feels weak. Also incontinent EXAM: CT HEAD WITHOUT CONTRAST TECHNIQUE: Contiguous axial images were obtained from the base of the skull through the vertex without intravenous contrast. COMPARISON: 07/08/2015  FINDINGS: There is central and cortical atrophy. There is no intra or extra-axial fluid collection or mass lesion. The basilar cisterns and ventricles have a normal appearance. There is no CT evidence for acute infarction or hemorrhage. Bone windows demonstrate significant opacification of the left maxillary sinus, similar in appearance to the previous exam. There is improved aeration of the right maxillary sinus although there is persistent mucoperiosteal thickening. No air-fluid levels are identified. No calvarial fracture. The mastoid air cells are normally aerated. IMPRESSION: 1. No evidence for acute intracranial abnormality. 2. Chronic sinus disease. Electronically Signed By: Nolon Nations M.D. On: 11/01/2015 13:42   Ct Chest Wo Contrast  11/01/2015 CLINICAL DATA: 65 year old female with history of pneumonia. Cough for the past several months. Originally diagnosed with right  lower lobe pneumonia in May 2015. EXAM: CT CHEST WITHOUT CONTRAST TECHNIQUE: Multidetector CT imaging of the chest was performed following the standard protocol without IV contrast. COMPARISON: Chest CT 08/16/2015. FINDINGS: Mediastinum/Lymph Nodes: Heart size is borderline enlarged. There is no significant pericardial fluid, thickening or pericardial calcification. There is atherosclerosis of the thoracic aorta, the great vessels of the mediastinum and the coronary arteries, including calcified atherosclerotic plaque in the left main, left anterior descending, left circumflex and right coronary arteries. Multiple enlarged mediastinal lymph nodes in the right peritracheal nodal station measuring up to 13 mm in short axis. Enlarged subcarinal nodes measuring up to 16 mm in short axis. Probable right hilar lymphadenopathy which cannot be measured on today's noncontrast CT examination. Esophagus is unremarkable in appearance. No axillary lymphadenopathy. Lungs/Pleura: There is severe cylindrical and cystic bronchiectasis  throughout the right middle and lower lobes. Several large nodular and mass-like areas of chronic consolidation are again noted throughout the right lower lobe. Thick-walled cavitary lesion in the right lower lobe again noted, currently measuring approximately 3.5 x 2.6 cm (image 35 of series 205). Extensive thickening of the peribronchovascular interstitium throughout the right middle and lower lobe. Less severe generalized bronchial wall thickening throughout the lungs bilaterally with mild centrilobular and paraseptal emphysema. Overall, compared to prior study from 08/16/2015, the severity of bronchiectasis has increased in the right lung, while the patchy multifocal airspace consolidation has slightly decreased throughout the right lung. No new airspace consolidation in the left lung. Mild linear scarring in the left lower lobe. Upper Abdomen: Atherosclerosis. Musculoskeletal/Soft Tissues: There are no aggressive appearing lytic or blastic lesions noted in the visualized portions of the skeleton. IMPRESSION: 1. Chronic complications of necrotizing pneumonia in the right lower lobe with areas of chronic airspace consolidation throughout the right middle and lower lobes demonstrating some mild improvement compared to prior studies, but with worsening areas of varicose and cystic bronchiectasis throughout this same distribution, multiple chronic nodular and mass-like appearing areas of consolidation, and chronic thick-walled cavitary area in the right lower lobe, as above. Given of these findings since the persistence May 2015, thoracic surgical consultation for right middle and right lower lobectomy might be considered, as these areas of chronic infection are unlikely to clear given the severity bronchiectasis throughout these regions on today's examination. At this time, there does not appear to be infectious involvement of the right upper lobe, left lower lobe or left upper lobe. 2. Mild generalized bronchial  wall thickening and mild centrilobular and paraseptal emphysema; imaging findings compatible with the reported clinical history of COPD. 3. Atherosclerosis, including 3 vessel coronary artery disease. Please note that although the presence of coronary artery calcium documents the presence of coronary artery disease, the severity of this disease and any potential stenosis cannot be assessed on this non-gated CT examination. Assessment for potential risk factor modification, dietary therapy or pharmacologic therapy may be warranted, if clinically indicated. These results were called by telephone at the time of interpretation on 11/01/2015 at 6:01 pm to Dr. Melvyn Novas, who verbally acknowledged these results. Electronically Signed By: Vinnie Langton M.D. On: 11/01/2015 18:03   Ct Chest High Resolution  11/03/2015 CLINICAL DATA: 65 year old female inpatient with chronic bronchiectasis and persistent productive cough. EXAM: CT CHEST WITHOUT CONTRAST TECHNIQUE: Multidetector CT imaging of the chest was performed following the standard protocol without intravenous contrast. High resolution imaging of the lungs, as well as inspiratory and expiratory imaging, was performed. COMPARISON: 11/01/2015 chest CT. FINDINGS: Mediastinum/Nodes: Normal heart size. No pericardial  fluid/thickening. Left anterior descending coronary atherosclerosis . Great vessels are normal in course and caliber. Normal visualized thyroid. There is scattered mild fluid throughout the thoracic esophagus. No axillary adenopathy. No appreciable change in mild to moderate right paratracheal and subcarinal mediastinal adenopathy. Stable poorly delineated mild right hilar adenopathy. Lungs/Pleura: No pneumothorax. Small right and trace left pleural effusions, increased. Mild centrilobular and paraseptal emphysema and diffuse bronchial wall thickening. There is essentially complete consolidation of the right lower lobe, with significant interval worsening.  There is a 3.4 x 2.4 cm right lower lobe thick walled cavity with increasing internal fluid, not appreciably changed in size. There is underlying severe cylindrical and varicoid bronchiectasis throughout the right middle and right lower lobes, as evidenced by dense mucoid impaction throughout the entire right middle lobe bronchial tree. There is worsening consolidation in the basilar right middle lobe. There is mild cylindrical bronchiectasis throughout the left lower lobe and minimal cylindrical bronchiectasis in the lingula. New bandlike ground-glass opacity in the basilar left lower lobe is favored to represent atelectasis. No significant pulmonary nodules or lung masses. No significant regions of subpleural reticulation, traction bronchiectasis or frank honeycombing. No appreciable air trapping on the limited expiration sequence. Upper abdomen: Unremarkable . Musculoskeletal: No aggressive appearing focal osseous lesions. Mild degenerative changes in the thoracic spine. IMPRESSION: 1. Significant interval worsening of cavitary infection in the right middle and right lower lobes, with now complete right lower lobe consolidation and worsening basilar right middle lobe consolidation. Underlying severe cylindrical and varicoid bronchiectasis throughout the right middle and right lower lobes. The patient has experienced multiple similar waxing and waning infections in this territory on multiple prior chest CT studies back to 07/06/2014. Differential considerations include recurrent aspiration or underlying immunodeficiency. 2. Mild left lower lobe and minimal lingular cylindrical bronchiectasis, with no acute consolidative airspace disease in the left lung or right upper lobe. 3. Mild centrilobular and paraseptal emphysema and diffuse bronchial wall thickening, suggesting COPD. 4. Small right and trace left pleural effusions, increased. 5. Stable nonspecific mild to moderate mediastinal and right hilar  lymphadenopathy. 6. One vessel coronary atherosclerosis. Electronically Signed By: Ilona Sorrel M.D. On: 11/03/2015 11:28   Dg Swallowing Func-speech Pathology  11/04/2015 Objective Swallowing Evaluation: Patient Details Name: KAYLENN CIVIL MRN: [id number] Date of Birth: 1950/12/08 Today's Date: 11/04/2015 Time: SLP Start Time (ACUTE ONLY): 0900-SLP Stop Time (ACUTE ONLY): 0930 SLP Time Calculation (min) (ACUTE ONLY): 30 min Past Medical History: Past Medical History Diagnosis Date . Hypertension essential hypertension . Tobacco abuse smoking about 1/2 pk of cigarettes a day . Anxiety . Depression psychiatrist Dr. Toy Care . Emphysematous COPD ([place]) changes in right base along with patchy areas on last x-ray . COPD (chronic obstructive pulmonary disease) ([place]) . History of echocardiogram 12/24/2006 Est. EF of 54-27% NORMAL LV SYSTOLIC FUNCTION WITH IMPAIRED RELAXATION -- MILD AORTIC SCLEROSIS -- NORMAL PALONARY ARTERY PRESSURE -- NO OLD ECHOS FOR COMPARISON -- [name], MD . History of cardiovascular stress test 08/15/2004 EF of 70% -- Normal stress cardiolite. There is no evidence of ischemia and there is normal LV function. -- [name]. MD . Diastolic dysfunction . Allergy . Asthma . Heart murmur . Pneumonia "several times since May 2015" (07/06/2014) . Chronic bronchitis ([place]) "get it alot; maybe not q yr" (07/06/2014) . GERD (gastroesophageal reflux disease) Past Surgical History: Past Surgical History Procedure Laterality Date . Tubal ligation 1986 HPI: 65 yo female smoker with hx COPD, RML/RLL bronchiectasis after a necrotizing PNA in 2015  with frequent bronchiectasis flares and ongoing outpt w/u of persistent RLL/RML infiltrate on CXR followed by Dr. Chase Caller. Presented to ER 12/1 with persistent dizziness and falling as well as worsening productive cough. Admitted by [facility] with ?HCAP v bronchiectasis flare. CXR with progressive infiltrates. Subjective: pt  denies difficulty swallowing, but does endorse frequent coughing while eating Assessment / Plan / Recommendation CHL IP CLINICAL IMPRESSIONS 11/04/2015 Therapy Diagnosis Mild pharyngeal phase dysphagia Clinical Impression Pt currently demonstrating a mild pharyngeal dysphagia characterized by a delay in swallow initiation to the level of the vallecula/ pyriform sinuses with thin liquids. No penetration/ aspiration was visualized during this study. Only trace residuals noted at the level of the CP segment. Pt did cough up a small amount of puree material about 5 seconds post-swallow- no penetrated or aspirated material visualized after this incident. No reflux to the level of the cervical esophagus or pharyngeal cavity was seen during or immediately after the swallow; however, the coughing incident does raise concern for possible esophageal involvement, especially given pt hx of GERD. Given cough incident and decreased respiratory status, aspiration risk appears moderate at this time. Recommend continuing dysphagia 2 diet, thin liquids, meds whole in puree, full supervision during meals to ensure pt is seated upright, taking small bites/ sips, sits upright 30-60 minutes after meal, encourage pt to pause/ catch breath between bites/ sips. Will continue to follow for diet tolerance/ consider advancement. Provided education to pt on results and compensatory strategies. Pt in agreement with recommendations. Impact on safety and function Moderate aspiration risk CHL IP TREATMENT RECOMMENDATION 11/04/2015 Treatment Recommendations Therapy as outlined in treatment plan below Prognosis 11/02/2015 Prognosis for Safe Diet Advancement (No Data) Barriers to Reach Goals -- Barriers/Prognosis Comment -- CHL IP DIET RECOMMENDATION 11/04/2015 SLP Diet Recommendations Dysphagia 2 (Fine chop) solids;Thin liquid Liquid Administration via Cup;Straw Medication Administration Whole meds with puree Compensations Slow rate;Small sips/bites  Postural Changes Seated upright at 90 degrees CHL IP OTHER RECOMMENDATIONS 11/04/2015 Recommended Consults Consider esophageal assessment Oral Care Recommendations Oral care BID Other Recommendations -- CHL IP FOLLOW UP RECOMMENDATIONS 11/02/2015 Follow up Recommendations (No Data) CHL IP FREQUENCY AND DURATION 11/04/2015 Speech Therapy Frequency (ACUTE ONLY) min 2x/week Treatment Duration 2 weeks CHL IP ORAL PHASE 11/04/2015 Oral Phase WFL Oral - Pudding Teaspoon -- Oral - Pudding Cup -- Oral - Honey Teaspoon -- Oral - Honey Cup -- Oral - Nectar Teaspoon -- Oral - Nectar Cup -- Oral - Nectar Straw -- Oral - Thin Teaspoon -- Oral - Thin Cup -- Oral - Thin Straw -- Oral - Puree -- Oral - Mech Soft -- Oral - Regular -- Oral - Multi-Consistency -- Oral - Pill -- Oral Phase - Comment -- CHL IP PHARYNGEAL PHASE 11/04/2015 Pharyngeal Phase Impaired Pharyngeal- Pudding Teaspoon -- Pharyngeal -- Pharyngeal- Pudding Cup -- Pharyngeal -- Pharyngeal- Honey Teaspoon -- Pharyngeal -- Pharyngeal- Honey Cup -- Pharyngeal -- Pharyngeal- Nectar Teaspoon -- Pharyngeal -- Pharyngeal- Nectar Cup -- Pharyngeal -- Pharyngeal- Nectar Straw -- Pharyngeal -- Pharyngeal- Thin Teaspoon -- Pharyngeal -- Pharyngeal- Thin Cup Delayed swallow initiation-vallecula Pharyngeal -- Pharyngeal- Thin Straw Delayed swallow initiation-vallecula;Delayed swallow initiation-pyriform sinuses Pharyngeal -- Pharyngeal- Puree Delayed swallow initiation-vallecula Pharyngeal -- Pharyngeal- Mechanical Soft Delayed swallow initiation-vallecula Pharyngeal -- Pharyngeal- Regular -- Pharyngeal -- Pharyngeal- Multi-consistency -- Pharyngeal -- Pharyngeal- Pill Delayed swallow initiation-vallecula Pharyngeal -- Pharyngeal Comment -- CHL IP CERVICAL ESOPHAGEAL PHASE 11/04/2015 Cervical Esophageal Phase WFL Pudding Teaspoon -- Pudding Cup -- Honey Teaspoon -- Honey Cup -- Nectar Teaspoon -- Nectar Cup --  Nectar Straw -- Thin Teaspoon -- Thin Cup -- Thin Straw --  Puree -- Mechanical Soft -- Regular -- Multi-consistency -- Pill -- Cervical Esophageal Comment -- Kern Reap, MA, CCC-SLP 11/04/2015, 9:56 AM [phone]   Dg Hips Bilat With Pelvis 3-4 Views  11/01/2015 CLINICAL DATA: Fall EXAM: DG HIP (WITH OR WITHOUT PELVIS) 3-4V BILAT COMPARISON: None. FINDINGS: There is no evidence of hip fracture or dislocation. There is no evidence of arthropathy or other focal bone abnormality. IMPRESSION: Negative. Electronically Signed By: Franchot Gallo M.D. On: 11/01/2015 13:12          Filed Weights   11/01/15 1100  Weight: 58.968 kg (130 lb)     Microbiology: Recent Results (from the past 240 hour(s))  Culture, expectorated sputum-assessment Status: None   Collection Time: 11/02/15 1         Review of Systems  Constitutional: Negative for fever, chills, diaphoresis and fatigue.  HENT: Positive for congestion.   Eyes: Negative for visual disturbance.  Respiratory: Positive for cough, shortness of breath and wheezing.   Cardiovascular: Negative for chest pain, palpitations and leg swelling.  Gastrointestinal: Negative for nausea, vomiting, abdominal pain, diarrhea and constipation.  Endocrine: Negative for cold intolerance, heat intolerance, polydipsia, polyphagia and polyuria.  Musculoskeletal: Positive for back pain.  Neurological: Negative for dizziness, tremors, seizures, syncope, facial asymmetry, speech difficulty, weakness, light-headedness, numbness and headaches.    Past Medical History  Diagnosis Date  . Hypertension     essential hypertension  . Tobacco abuse     smoking about 1/2 pk of cigarettes a day  . Anxiety   . Depression     psychiatrist Dr. Toy Care  . Emphysematous COPD ([place])     changes in right base along with patchy areas on last x-ray  . COPD (chronic obstructive pulmonary disease) ([place])   . History of echocardiogram 12/24/2006    Est. EF of 16-10% NORMAL LV SYSTOLIC FUNCTION WITH  IMPAIRED RELAXATION -- MILD AORTIC SCLEROSIS -- NORMAL PALONARY ARTERY PRESSURE -- NO OLD ECHOS FOR COMPARISON -- [name], MD  . History of cardiovascular stress test 08/15/2004    EF of 70% -- Normal stress cardiolite.  There is no evidence of ischemia and there is normal LV function. -- [name]. MD  . Diastolic dysfunction   . Allergy   . Asthma   . Heart murmur   . Pneumonia     "several times since May 2015" (07/06/2014)  . Chronic bronchitis ([place])     "get it alot; maybe not q yr" (07/06/2014)  . GERD (gastroesophageal reflux disease)    Past Surgical History  Procedure Laterality Date  . Tubal ligation  1986   Allergies  Allergen Reactions  . Ceclor [Cefaclor] Hives  . Escitalopram Oxalate     Pt does not recall ever taking medication  . Sertraline Hcl     UNKNOWN  . Sulfa Drugs Cross Reactors Hives and Rash    Hives and rash    Social History   Social History  . Marital Status: Married    Spouse Name: N/A  . Number of Children: N/A  . Years of Education: N/A   Occupational History  . SALES ASSOCIATE     [employer]   Social History Main Topics  . Smoking status: Current Every Day Smoker -- 1.00 packs/day for 42 years    Types: Cigarettes    Last Attempt to Quit: 10/18/2015  . Smokeless tobacco: Never Used  . Alcohol Use: 0.0  oz/week    0 Standard drinks or equivalent per week     Comment: sicial  . Drug Use: No  . Sexual Activity: No   Other Topics Concern  . Not on file   Social History Narrative   Patient currently lives with her husband. She works in [employer], research (medical). They do have a dog. No bird or hot tub exposure. No mold exposure. No recent travel.   Family History  Problem Relation Age of Onset  . Stroke Mother   . Heart disease Father   . Hyperlipidemia Father   . Hypertension Father   . Breast cancer Maternal Aunt   . Asthma Maternal Grandmother        Objective:    BP 124/78 mmHg  Pulse 86  Temp(Src) 98.4 F (36.9 C) (Oral)   Resp 17  Ht '5\' 3"'$  (1.6 m)  Wt 131 lb (59.421 kg)  BMI 23.21 kg/m2  SpO2 94% Physical Exam  Constitutional: She is oriented to person, place, and time. She appears well-developed and well-nourished. No distress.  HENT:  Head: Normocephalic and atraumatic.  Right Ear: External ear normal.  Left Ear: External ear normal.  Nose: Nose normal.  Mouth/Throat: Oropharynx is clear and moist.  Eyes: Conjunctivae and EOM are normal. Pupils are equal, round, and reactive to light.  Neck: Normal range of motion. Neck supple. Carotid bruit is not present. No thyromegaly present.  Cardiovascular: Normal rate, regular rhythm, normal heart sounds and intact distal pulses.  Exam reveals no gallop and no friction rub.   No murmur heard. Pulmonary/Chest: Effort normal. She has no wheezes. She has rales in the right lower field.  Abdominal: Soft. Bowel sounds are normal. She exhibits no distension and no mass. There is no tenderness. There is no rebound and no guarding.  Lymphadenopathy:    She has no cervical adenopathy.  Neurological: She is alert and oriented to person, place, and time. No cranial nerve deficit.  Skin: Skin is warm and dry. No rash noted. She is not diaphoretic. No erythema. No pallor.  Psychiatric: She has a normal mood and affect. Her behavior is normal.    [facility] reading (PRIMARY) by  Dr. Tamala Julian. CXR: persistent RML, RLL infiltrate.      Assessment & Plan:   1. CAP (community acquired pneumonia)   2. Hyponatremia   3. Bronchiectasis with acute exacerbation ([place])   4. Fever, unspecified   5. Lumbar back pain with radiculopathy affecting right lower extremity   6. Urinary frequency     Orders Placed This Encounter  Procedures  . Urine culture  . DG Chest 2 View    Standing Status: Future     Number of Occurrences: 1     Standing Expiration Date: 12/11/2016    Order Specific Question:  Reason for Exam (SYMPTOM  OR DIAGNOSIS REQUIRED)    Answer:  cough; community acquired  pneumonia follow-up; fever for two days with worsening cough    Order Specific Question:  Preferred imaging location?    Answer:  External  . Comprehensive metabolic panel  . POCT CBC  . POCT Influenza A/B  . POCT urinalysis dipstick  . POCT Microscopic Urinalysis ([facility])   Meds ordered this encounter  Medications  . cefdinir (OMNICEF) 300 MG capsule    Sig: Take 2 capsules (600 mg total) by mouth daily.    Dispense:  30 capsule    Refill:  0  . tiZANidine (ZANAFLEX) 4 MG tablet    Sig: Take 1 tablet (  4 mg total) by mouth every 8 (eight) hours as needed for muscle spasms.    Dispense:  45 tablet    Refill:  0  . HYDROcodone-acetaminophen (NORCO/VICODIN) 5-325 MG tablet    Sig: Take 1 tablet by mouth every 6 (six) hours as needed for moderate pain.    Dispense:  40 tablet    Refill:  0    No Follow-up on file.    [place] Elayne Guerin, M.D. [facility] [address] [place], [place]  [address] [phone] phone [phone] fax

## 2015-12-12 NOTE — Telephone Encounter (Signed)
lmtcb X2 for pt.  

## 2015-12-13 LAB — COMPREHENSIVE METABOLIC PANEL
ALBUMIN: 3.1 g/dL — AB (ref 3.6–5.1)
ALK PHOS: 99 U/L (ref 33–130)
ALT: 11 U/L (ref 6–29)
AST: 14 U/L (ref 10–35)
BILIRUBIN TOTAL: 0.4 mg/dL (ref 0.2–1.2)
BUN: 5 mg/dL — AB (ref 7–25)
CO2: 32 mmol/L — ABNORMAL HIGH (ref 20–31)
CREATININE: 0.55 mg/dL (ref 0.50–0.99)
Calcium: 8.4 mg/dL — ABNORMAL LOW (ref 8.6–10.4)
Chloride: 96 mmol/L — ABNORMAL LOW (ref 98–110)
Glucose, Bld: 97 mg/dL (ref 65–99)
Potassium: 3.2 mmol/L — ABNORMAL LOW (ref 3.5–5.3)
SODIUM: 133 mmol/L — AB (ref 135–146)
TOTAL PROTEIN: 7.3 g/dL (ref 6.1–8.1)

## 2015-12-13 NOTE — Telephone Encounter (Signed)
lmtcb for [place].

## 2015-12-14 LAB — URINE CULTURE
COLONY COUNT: NO GROWTH
Organism ID, Bacteria: NO GROWTH

## 2015-12-14 NOTE — Telephone Encounter (Signed)
Spoke with [facility], states that if disability forms have been signed and turned in under Dr Agustina Caroli name then they are not needed for Dr Chase Caller. Any forms received for Dr Chase Caller for Disability can be shredded.

## 2015-12-14 NOTE — Telephone Encounter (Signed)
lmtcb x2 for [place].

## 2015-12-16 LAB — AFB CULTURE WITH SMEAR (NOT AT ARMC): ACID FAST SMEAR: NONE SEEN

## 2015-12-17 ENCOUNTER — Inpatient Hospital Stay: Payer: Self-pay | Admitting: Adult Health

## 2015-12-17 NOTE — Telephone Encounter (Signed)
Disability forms sent back to [place]: Catherine Kelley to be be shredded so that they can record and document accordingly.  Nothing further needed.

## 2015-12-19 ENCOUNTER — Ambulatory Visit (INDEPENDENT_AMBULATORY_CARE_PROVIDER_SITE_OTHER): Payer: Managed Care, Other (non HMO) | Admitting: Adult Health

## 2015-12-19 ENCOUNTER — Encounter: Payer: Self-pay | Admitting: Adult Health

## 2015-12-19 VITALS — BP 116/78 | HR 94 | Temp 98.4°F | Ht 63.0 in | Wt 132.0 lb

## 2015-12-19 DIAGNOSIS — J449 Chronic obstructive pulmonary disease, unspecified: Secondary | ICD-10-CM

## 2015-12-19 DIAGNOSIS — J471 Bronchiectasis with (acute) exacerbation: Secondary | ICD-10-CM | POA: Diagnosis not present

## 2015-12-19 DIAGNOSIS — J189 Pneumonia, unspecified organism: Secondary | ICD-10-CM | POA: Diagnosis not present

## 2015-12-19 DIAGNOSIS — J984 Other disorders of lung: Secondary | ICD-10-CM

## 2015-12-19 NOTE — Patient Instructions (Addendum)
[employer] as directed.  Take Probiotic daily  Set up for CT chest in 3 weeks . Work on not smoking .  follow up with Rexene Edison NP in 4 weeks and As needed   follow up Dr. Chase Caller in 3 months and As needed   Please contact office for sooner follow up if symptoms do not improve or worsen or seek emergency care

## 2015-12-19 NOTE — Assessment & Plan Note (Signed)
Recurrent PNA with RML/RLL consolidation +adenopathy Swallow eval w/ mild dysphagia , no overt aspiration  She is a smoker, will have her finish abx , repeat CT chest in 3 weeks  If area is not improving , consider FOB  Pt has had consolidation along this area dating back to 2015 w/ cavitary PNA  She has had multiple exacerbations.   Plan  [place] [facility] as directed.  Take Probiotic daily  Set up for CT chest in 3 weeks . Work on not smoking .  follow up with Rexene Edison NP in 4 weeks and As needed   follow up Dr. Chase Caller in 3 months and As needed   Please contact office for sooner follow up if symptoms do not improve or worsen or seek emergency care

## 2015-12-19 NOTE — Progress Notes (Signed)
   Subjective:    Patient ID: Catherine Kelley, female    DOB: 1951/01/29, 65 y.o.   MRN: [id number]  HPI 65 yo female smoker followed for recurrent PNA, Bronchiectasis , and COPD   PFT today showed an FEV1 of 1.45 L, which was 64% of predicted, ratio of 72%, no significant bronchodilator response, FVC at 70%, air trapping noted. Diffusing capacity decreased at 67%   12/19/2015 [facility] follow up  Pt returns for a post hospital follow up .  Admitted again in Dec for recurrent RLL/RML HCAP +/- bronchiectasis flare.  She underwent extenisve testing to look at causes for recurrent PNA CT head 12/1 >> neg acute, chronic sinus disease Swallow eval (MBS) 12/4 >> mild dysphagia, no obvious aspiration on this eval but confirms moderate aspiration risk. Dys II diet recommended  HRCT 12/3 >> Significant interval worsening of cavitary infection in the right middle and right lower lobes, with now complete right lower lobe consolidation and worsening basilar right middle lobe consolidation. Underlying severe cylindrical and varicoid bronchiectasis throughout the right middle and right lower lobes. She does cont to smoke, cessation discussed. .  She had necrotizing PNA in 2015 .  She was tx w/ prolonged abx w/ Cipro x 4 weeks. Cx are neg with Fungal/MAI.  She says she has gotten slightly better but has lingering congestion with drainage, low grade fevers. She was seen by PCP , last week with CXR w/ persistent RML/RLL PNA . She was started on 2 weeks of omnicef.  She denies hemoptysis , chest pain, orthopnea, edema or n/v/d.       Current Medications, Allergies, Complete Past Medical History, Past Surgical History, Family History, and Social History were reviewed in [employer] record.  ROS  The following are not active complaints unless bolded sore throat, dysphagia, dental problems, itching, sneezing,  nasal congestion or excess/ purulent secretions, ear ache,   sweats,  unintended wt loss, classically pleuritic or exertional cp, hemoptysis,  orthopnea pnd or leg swelling, presyncope, palpitations, abdominal pain, anorexia, nausea, vomiting, diarrhea  or change in bowel or bladder habits, change in stools or urine, dysuria,hematuria,  rash, arthralgias, visual complaints, headache, numbness, weakness or ataxia or problems with walking or coordination,  change in mood/affect or memory.                   Objective:   Physical Exam   amb chronically ill wf  Vital signs reviewed   HEENT: nl dentition, turbinates, and orophanx. Nl external ear canals without cough reflex   NECK :  without JVD/Nodes/TM/ nl carotid upstrokes bilaterally   LUNGS: few scattered rhonchi    CV:  RRR  no s3 or murmur or increase in P2, no edema   ABD:  soft and nontender with nl excursion in the supine position. No bruits or organomegaly, bowel sounds nl  MS:  warm without deformities, calf tenderness, cyanosis or clubbing  SKIN: warm and dry without lesions    NEURO:  alert, approp, no deficits      12/12/15 CXR  Persistent right middle lobe and right lower lobe pneumonia

## 2015-12-19 NOTE — Assessment & Plan Note (Signed)
Cont on current regimen  Smoking cessation

## 2015-12-19 NOTE — Assessment & Plan Note (Signed)
Recurrent exacerbation  Swallowing evaluation with no overt aspiration , some mild dysphagia  Cont on abx regimen .  Smoking cessation discussed.

## 2015-12-26 ENCOUNTER — Telehealth: Payer: Self-pay | Admitting: *Deleted

## 2015-12-26 NOTE — Telephone Encounter (Signed)
Pt notified of notes.  She stated that she has taken all of the supplement that she had.  She asked can she just eat a banana a day.  She is also still feeling about the same, still coughing up green stuff.  She is taking her last of her antibiotic tomorrow morning

## 2015-12-26 NOTE — Telephone Encounter (Signed)
-----   Message from Wardell Honour, MD sent at 12/25/2015 11:53 AM EST ----- Call --- 1. Urine culture negative for urinary tract infection.  2. Sodium level slightly low at 133.  3.  Potassium level also slightly low at 3.2.  I recommend patient take potassium supplement one daily for the next seven days.  I have in her note that she currently is not taking the supplement; is this correct?

## 2015-12-29 ENCOUNTER — Other Ambulatory Visit: Payer: Self-pay | Admitting: Internal Medicine

## 2015-12-30 ENCOUNTER — Other Ambulatory Visit: Payer: Self-pay | Admitting: Family Medicine

## 2016-01-01 ENCOUNTER — Telehealth: Payer: Self-pay | Admitting: Internal Medicine

## 2016-01-01 ENCOUNTER — Encounter: Payer: Self-pay | Admitting: *Deleted

## 2016-01-01 NOTE — Telephone Encounter (Signed)
Letter mailed to the number given  [facility] for her to be made aware

## 2016-01-01 NOTE — Telephone Encounter (Signed)
Yes that's OK to include the dates she was out

## 2016-01-01 NOTE — Telephone Encounter (Signed)
Called spoke with pt. She is needing a letter from [place] since he signed off on her STD. He put she would be out from 12/1-12/8. She ended up being out for the whole month from 12/1-12/30 and returned to work on 12/01/15. She needs this faxed to [phone]. Please advise RB if this is okay to do so? Thanks

## 2016-01-09 ENCOUNTER — Ambulatory Visit (INDEPENDENT_AMBULATORY_CARE_PROVIDER_SITE_OTHER): Payer: Managed Care, Other (non HMO) | Admitting: Family Medicine

## 2016-01-09 ENCOUNTER — Ambulatory Visit (INDEPENDENT_AMBULATORY_CARE_PROVIDER_SITE_OTHER)
Admission: RE | Admit: 2016-01-09 | Discharge: 2016-01-09 | Disposition: A | Discharge status: Home / Self Care | Payer: Managed Care, Other (non HMO) | Source: Ambulatory Visit | Attending: Adult Health | Admitting: Adult Health

## 2016-01-09 ENCOUNTER — Emergency Department (HOSPITAL_COMMUNITY): Payer: Managed Care, Other (non HMO)

## 2016-01-09 ENCOUNTER — Encounter (HOSPITAL_COMMUNITY): Payer: Self-pay | Admitting: Vascular Surgery

## 2016-01-09 ENCOUNTER — Inpatient Hospital Stay (HOSPITAL_COMMUNITY)
Admission: EM | Admit: 2016-01-09 | Discharge: 2016-01-15 | DRG: 190 | Disposition: A | Discharge status: Home / Self Care | Payer: Managed Care, Other (non HMO) | Attending: Internal Medicine | Admitting: Internal Medicine

## 2016-01-09 VITALS — BP 130/80 | HR 91 | Temp 98.5°F | Resp 20 | Ht 62.0 in | Wt 129.8 lb

## 2016-01-09 DIAGNOSIS — R0902 Hypoxemia: Secondary | ICD-10-CM | POA: Diagnosis not present

## 2016-01-09 DIAGNOSIS — Z823 Family history of stroke: Secondary | ICD-10-CM

## 2016-01-09 DIAGNOSIS — J189 Pneumonia, unspecified organism: Secondary | ICD-10-CM | POA: Diagnosis not present

## 2016-01-09 DIAGNOSIS — J449 Chronic obstructive pulmonary disease, unspecified: Secondary | ICD-10-CM | POA: Diagnosis present

## 2016-01-09 DIAGNOSIS — F329 Major depressive disorder, single episode, unspecified: Secondary | ICD-10-CM | POA: Diagnosis present

## 2016-01-09 DIAGNOSIS — D649 Anemia, unspecified: Secondary | ICD-10-CM | POA: Diagnosis present

## 2016-01-09 DIAGNOSIS — F419 Anxiety disorder, unspecified: Secondary | ICD-10-CM | POA: Diagnosis present

## 2016-01-09 DIAGNOSIS — B370 Candidal stomatitis: Secondary | ICD-10-CM | POA: Diagnosis not present

## 2016-01-09 DIAGNOSIS — I700 Atherosclerosis of aorta: Secondary | ICD-10-CM | POA: Diagnosis present

## 2016-01-09 DIAGNOSIS — Z6821 Body mass index (BMI) 21.0-21.9, adult: Secondary | ICD-10-CM

## 2016-01-09 DIAGNOSIS — Z803 Family history of malignant neoplasm of breast: Secondary | ICD-10-CM | POA: Diagnosis not present

## 2016-01-09 DIAGNOSIS — Z8249 Family history of ischemic heart disease and other diseases of the circulatory system: Secondary | ICD-10-CM | POA: Diagnosis not present

## 2016-01-09 DIAGNOSIS — I10 Essential (primary) hypertension: Secondary | ICD-10-CM | POA: Diagnosis present

## 2016-01-09 DIAGNOSIS — J441 Chronic obstructive pulmonary disease with (acute) exacerbation: Secondary | ICD-10-CM | POA: Diagnosis present

## 2016-01-09 DIAGNOSIS — J471 Bronchiectasis with (acute) exacerbation: Secondary | ICD-10-CM | POA: Diagnosis present

## 2016-01-09 DIAGNOSIS — E876 Hypokalemia: Secondary | ICD-10-CM | POA: Diagnosis present

## 2016-01-09 DIAGNOSIS — Z825 Family history of asthma and other chronic lower respiratory diseases: Secondary | ICD-10-CM | POA: Diagnosis not present

## 2016-01-09 DIAGNOSIS — J9691 Respiratory failure, unspecified with hypoxia: Secondary | ICD-10-CM | POA: Diagnosis present

## 2016-01-09 DIAGNOSIS — R0600 Dyspnea, unspecified: Secondary | ICD-10-CM | POA: Diagnosis not present

## 2016-01-09 DIAGNOSIS — J440 Chronic obstructive pulmonary disease with acute lower respiratory infection: Secondary | ICD-10-CM | POA: Diagnosis present

## 2016-01-09 DIAGNOSIS — J9601 Acute respiratory failure with hypoxia: Secondary | ICD-10-CM

## 2016-01-09 DIAGNOSIS — Z8701 Personal history of pneumonia (recurrent): Secondary | ICD-10-CM

## 2016-01-09 DIAGNOSIS — R0602 Shortness of breath: Secondary | ICD-10-CM | POA: Diagnosis not present

## 2016-01-09 DIAGNOSIS — Y95 Nosocomial condition: Secondary | ICD-10-CM | POA: Diagnosis present

## 2016-01-09 DIAGNOSIS — E871 Hypo-osmolality and hyponatremia: Secondary | ICD-10-CM | POA: Diagnosis present

## 2016-01-09 DIAGNOSIS — F17210 Nicotine dependence, cigarettes, uncomplicated: Secondary | ICD-10-CM | POA: Diagnosis not present

## 2016-01-09 DIAGNOSIS — R42 Dizziness and giddiness: Secondary | ICD-10-CM

## 2016-01-09 DIAGNOSIS — F39 Unspecified mood [affective] disorder: Secondary | ICD-10-CM | POA: Diagnosis present

## 2016-01-09 DIAGNOSIS — K5900 Constipation, unspecified: Secondary | ICD-10-CM | POA: Diagnosis not present

## 2016-01-09 DIAGNOSIS — E440 Moderate protein-calorie malnutrition: Secondary | ICD-10-CM | POA: Diagnosis present

## 2016-01-09 DIAGNOSIS — E43 Unspecified severe protein-calorie malnutrition: Secondary | ICD-10-CM | POA: Diagnosis present

## 2016-01-09 LAB — CBC WITH DIFFERENTIAL/PLATELET
BASOS ABS: 0 10*3/uL (ref 0.0–0.1)
BASOS PCT: 0 %
EOS ABS: 0 10*3/uL (ref 0.0–0.7)
EOS PCT: 0 %
HCT: 37 % (ref 36.0–46.0)
Hemoglobin: 12.6 g/dL (ref 12.0–15.0)
Lymphocytes Relative: 10 %
Lymphs Abs: 1 10*3/uL (ref 0.7–4.0)
MCH: 28.8 pg (ref 26.0–34.0)
MCHC: 34.1 g/dL (ref 30.0–36.0)
MCV: 84.7 fL (ref 78.0–100.0)
Monocytes Absolute: 0.2 10*3/uL (ref 0.1–1.0)
Monocytes Relative: 2 %
Neutro Abs: 8.6 10*3/uL — ABNORMAL HIGH (ref 1.7–7.7)
Neutrophils Relative %: 88 %
PLATELETS: 269 10*3/uL (ref 150–400)
RBC: 4.37 MIL/uL (ref 3.87–5.11)
RDW: 14.7 % (ref 11.5–15.5)
WBC: 9.8 10*3/uL (ref 4.0–10.5)

## 2016-01-09 LAB — BASIC METABOLIC PANEL
ANION GAP: 12 (ref 5–15)
CO2: 31 mmol/L (ref 22–32)
Calcium: 8.9 mg/dL (ref 8.9–10.3)
Chloride: 88 mmol/L — ABNORMAL LOW (ref 101–111)
Creatinine, Ser: 0.47 mg/dL (ref 0.44–1.00)
GFR calc Af Amer: >60 mL/min (ref >60)
Glucose, Bld: 114 mg/dL — ABNORMAL HIGH (ref 65–99)
POTASSIUM: 3.4 mmol/L — AB (ref 3.5–5.1)
SODIUM: 131 mmol/L — AB (ref 135–145)

## 2016-01-09 LAB — I-STAT ARTERIAL BLOOD GAS, ED
ACID-BASE EXCESS: 7 mmol/L — AB (ref 0.0–2.0)
Bicarbonate: 32.7 mEq/L — ABNORMAL HIGH (ref 20.0–24.0)
O2 SAT: 92 %
PCO2 ART: 47.6 mmHg — AB (ref 35.0–45.0)
TCO2: 34 mmol/L (ref 0–100)
pH, Arterial: 7.445 (ref 7.350–7.450)
pO2, Arterial: 62 mmHg — ABNORMAL LOW (ref 80.0–100.0)

## 2016-01-09 LAB — POCT CBC
Granulocyte percent: 76.7 % (ref 37–80)
HCT, POC: 41.2 % (ref 37.7–47.9)
Hemoglobin: 13.9 g/dL (ref 12.2–16.2)
Lymph, poc: 2.3 (ref 0.6–3.4)
MCH, POC: 28.4 pg (ref 27–31.2)
MCHC: 33.8 g/dL (ref 31.8–35.4)
MCV: 84.1 fL (ref 80–97)
MID (cbc): 0.8 (ref 0–0.9)
MPV: 7.2 fL (ref 0–99.8)
POC Granulocyte: 10.4 — AB (ref 2–6.9)
POC LYMPH PERCENT: 17.1 %L (ref 10–50)
POC MID %: 6.2 %M (ref 0–12)
Platelet Count, POC: 330 10*3/uL (ref 142–424)
RBC: 4.9 M/uL (ref 4.04–5.48)
RDW, POC: 15.3 %
WBC: 13.6 10*3/uL — AB (ref 4.6–10.2)

## 2016-01-09 LAB — URINALYSIS, ROUTINE W REFLEX MICROSCOPIC
Bilirubin Urine: NEGATIVE
GLUCOSE, UA: NEGATIVE mg/dL
Hgb urine dipstick: NEGATIVE
KETONES UR: 15 mg/dL — AB
LEUKOCYTES UA: NEGATIVE
Nitrite: NEGATIVE
PROTEIN: NEGATIVE mg/dL
Specific Gravity, Urine: 1.004 — ABNORMAL LOW (ref 1.005–1.030)
pH: 6.5 (ref 5.0–8.0)

## 2016-01-09 LAB — COMPLETE METABOLIC PANEL WITH GFR
ALT: 15 U/L (ref 6–29)
Albumin: 3 g/dL — ABNORMAL LOW (ref 3.6–5.1)
Alkaline Phosphatase: 109 U/L (ref 33–130)
BUN: 6 mg/dL — ABNORMAL LOW (ref 7–25)
Calcium: 8.9 mg/dL (ref 8.6–10.4)
Chloride: 82 mmol/L — ABNORMAL LOW (ref 98–110)
Creat: 0.52 mg/dL (ref 0.50–0.99)
GFR, Est African American: >89 mL/min (ref >=60)

## 2016-01-09 LAB — COMPLETE METABOLIC PANEL WITHOUT GFR
AST: 42 U/L — ABNORMAL HIGH (ref 10–35)
CO2: 32 mmol/L — ABNORMAL HIGH (ref 20–31)
GFR, Est Non African American: >89 mL/min (ref >=60)
Glucose, Bld: 108 mg/dL — ABNORMAL HIGH (ref 65–99)
Potassium: 5.1 mmol/L (ref 3.5–5.3)
Sodium: 124 mmol/L — ABNORMAL LOW (ref 135–146)
Total Bilirubin: 0.6 mg/dL (ref 0.2–1.2)
Total Protein: 8.6 g/dL — ABNORMAL HIGH (ref 6.1–8.1)

## 2016-01-09 LAB — GLUCOSE, POCT (MANUAL RESULT ENTRY): POC Glucose: 128 mg/dL — AB (ref 70–99)

## 2016-01-09 LAB — I-STAT CG4 LACTIC ACID, ED: Lactic Acid, Venous: 1.41 mmol/L (ref 0.5–2.0)

## 2016-01-09 LAB — TROPONIN I

## 2016-01-09 MED ORDER — IPRATROPIUM BROMIDE 0.02 % IN SOLN
0.5000 mg | Freq: Once | RESPIRATORY_TRACT | Status: AC
Start: 2016-01-09 — End: 2016-01-09
  Administered 2016-01-09: 0.5 mg via RESPIRATORY_TRACT

## 2016-01-09 MED ORDER — SODIUM CHLORIDE 0.9 % IV SOLN
INTRAVENOUS | Status: DC
  Administered 2016-01-09: via INTRAVENOUS

## 2016-01-09 MED ORDER — ALBUTEROL SULFATE (2.5 MG/3ML) 0.083% IN NEBU
2.5000 mg | INHALATION_SOLUTION | Freq: Once | RESPIRATORY_TRACT | Status: AC
  Administered 2016-01-09: 2.5 mg via RESPIRATORY_TRACT

## 2016-01-09 MED ORDER — IPRATROPIUM BROMIDE 0.02 % IN SOLN
0.5000 mg | Freq: Once | RESPIRATORY_TRACT | Status: AC
  Administered 2016-01-09: 0.5 mg via RESPIRATORY_TRACT

## 2016-01-09 MED ORDER — METHYLPREDNISOLONE SODIUM SUCC 125 MG IJ SOLR
125.0000 mg | Freq: Once | INTRAMUSCULAR | Status: AC
  Administered 2016-01-09: 125 mg via INTRAMUSCULAR

## 2016-01-09 MED ORDER — VANCOMYCIN HCL 500 MG IV SOLR
500.0000 mg | Freq: Two times a day (BID) | INTRAVENOUS | Status: DC
  Administered 2016-01-10 – 2016-01-11 (×3): 500 mg via INTRAVENOUS
  Filled 2016-01-09 (×5): qty 500

## 2016-01-09 MED ORDER — DEXTROSE 5 % IV SOLN
2.0000 g | Freq: Once | INTRAVENOUS | Status: AC
  Administered 2016-01-09: 2 g via INTRAVENOUS
  Filled 2016-01-09: qty 2

## 2016-01-09 MED ORDER — IPRATROPIUM-ALBUTEROL 0.5-2.5 (3) MG/3ML IN SOLN
3.0000 mL | Freq: Once | RESPIRATORY_TRACT | Status: AC
  Administered 2016-01-09: 3 mL via RESPIRATORY_TRACT
  Filled 2016-01-09: qty 3

## 2016-01-09 MED ORDER — VANCOMYCIN HCL IN DEXTROSE 1-5 GM/200ML-% IV SOLN
1000.0000 mg | Freq: Once | INTRAVENOUS | Status: AC
  Administered 2016-01-09: 1000 mg via INTRAVENOUS
  Filled 2016-01-09: qty 200

## 2016-01-09 NOTE — Progress Notes (Signed)
 Chief Complaint:  Chief Complaint  Patient presents with  . Hospitalization Follow-up    pneumonia  . Shortness of Breath    HPI: Catherine Kelley is a 65 y.o. female who reports to [facility] today complaining of worsenign SOB with minimal exertion and at rest, no fevers or chills. Recent hospitalization for  Necrotizing PNA  , today had CT scan of chest. Shows improvement of PNA, incidental finding of CAD. SHe has SOB and dizziness. She feels dizzy , she states this illlness has taken a toll on her, she can't work, she still smokes,  She has COPD and is on inhalers but no nebs since machine is broken.    SpO2 Readings from Last 3 Encounters:  01/09/16 81%  12/19/15 97%  12/12/15 94%    IMPRESSION: 1. Sequela of prior necrotizing pneumonia in the right lower and middle lobes redemonstrated with multiple cavitary areas and extensive varicose and cystic bronchiectasis, similar to prior examinations. Compared to the most recent prior study, the extent of associated airspace consolidation has significantly improved. Right hilar and mediastinal lymphadenopathy, similar to prior examinations, likely reactive. 2. Mild centrilobular and paraseptal emphysema; imaging findings suggestive of underlying COPD. 3. Atherosclerosis, including left main and 2 vessel coronary artery disease. Please note that although the presence of coronary artery calcium documents the presence of coronary artery disease, the severity of this disease and any potential stenosis cannot be assessed on this non-gated CT examination. Assessment for potential risk factor modification, dietary therapy or pharmacologic therapy may be warranted, if clinically indicated. 4. Additional incidental findings, as above.   Electronically Signed  By: Vinnie Langton M.D.  On: 01/09/2016 15:43    12/19/2015 [facility] follow up  Pt returns for a post hospital follow up .  Admitted again in Dec for recurrent  RLL/RML HCAP +/- bronchiectasis flare.  She underwent extenisve testing to look at causes for recurrent PNA CT head 12/1 >> neg acute, chronic sinus disease Swallow eval (MBS) 12/4 >> mild dysphagia, no obvious aspiration on this eval but confirms moderate aspiration risk. Dys II diet recommended  HRCT 12/3 >> Significant interval worsening of cavitary infection in the right middle and right lower lobes, with now complete right lower lobe consolidation and worsening basilar right middle lobe consolidation. Underlying severe cylindrical and varicoid bronchiectasis throughout the right middle and right lower lobes. She does cont to smoke, cessation discussed. .  She had necrotizing PNA in 2015 .  She was tx w/ prolonged abx w/ Cipro x 4 weeks. Cx are neg with Fungal/MAI.  She says she has gotten slightly better but has lingering congestion with drainage, low grade fevers. She was seen by PCP , last week with CXR w/ persistent RML/RLL PNA . She was started on 2 weeks of omnicef.  She denies hemoptysis , chest pain, orthopnea, edema or n/v/d.   Past Medical History  Diagnosis Date  . Hypertension     essential hypertension  . Tobacco abuse     smoking about 1/2 pk of cigarettes a day  . Anxiety   . Depression     psychiatrist Dr. Toy Care  . Emphysematous COPD ([place])     changes in right base along with patchy areas on last x-ray  . COPD (chronic obstructive pulmonary disease) ([place])   . History of echocardiogram 12/24/2006    Est. EF of 76-54% NORMAL LV SYSTOLIC FUNCTION WITH IMPAIRED RELAXATION -- MILD AORTIC SCLEROSIS -- NORMAL PALONARY ARTERY PRESSURE -- NO  OLD ECHOS FOR COMPARISON -- [name], MD  . History of cardiovascular stress test 08/15/2004    EF of 70% -- Normal stress cardiolite.  There is no evidence of ischemia and there is normal LV function. -- [name]. MD  . Diastolic dysfunction   . Allergy   . Asthma   . Heart murmur   . Pneumonia     "several  times since May 2015" (07/06/2014)  . Chronic bronchitis ([place])     "get it alot; maybe not q yr" (07/06/2014)  . GERD (gastroesophageal reflux disease)    Past Surgical History  Procedure Laterality Date  . Tubal ligation  1986   Social History   Social History  . Marital Status: Married    Spouse Name: N/A  . Number of Children: N/A  . Years of Education: N/A   Occupational History  . SALES ASSOCIATE     [employer]   Social History Main Topics  . Smoking status: Current Every Day Smoker -- 1.00 packs/day for 42 years    Types: Cigarettes    Last Attempt to Quit: 10/18/2015  . Smokeless tobacco: Never Used  . Alcohol Use: 0.0 oz/week    0 Standard drinks or equivalent per week     Comment: sicial  . Drug Use: No  . Sexual Activity: No   Other Topics Concern  . None   Social History Narrative   Patient currently lives with her husband. She works in Scientist, research (medical). They do have a dog. No bird or hot tub exposure. No mold exposure. No recent travel.   Family History  Problem Relation Age of Onset  . Stroke Mother   . Heart disease Father   . Hyperlipidemia Father   . Hypertension Father   . Breast cancer Maternal Aunt   . Asthma Maternal Grandmother    Allergies  Allergen Reactions  . Ceclor [Cefaclor] Hives  . Escitalopram Oxalate     Pt does not recall ever taking medication  . Sertraline Hcl     UNKNOWN  . Sulfa Drugs Cross Reactors Hives and Rash    Hives and rash   Prior to Admission medications   Medication Sig Start Date End Date Taking? Authorizing Provider  ALPRAZolam Duanne Moron) 0.5 MG tablet Take 0.5 mg by mouth at bedtime.    Yes Historical Provider, MD  benzonatate (TESSALON) 100 MG capsule TAKE 1 TO 2 CAPSULES BY MOUTH 3 TIMES DAILY AS NEEDED FOR COUGH 10/03/15  Yes Wardell Honour, MD  Calcium Carbonate-Vitamin D (CALCIUM-VITAMIN D) 500-200 MG-UNIT per tablet Take 1 tablet by mouth daily.   Yes Historical Provider, MD  cefdinir (OMNICEF) 300 MG capsule Take 2  capsules (600 mg total) by mouth daily. 12/12/15  Yes Wardell Honour, MD  clotrimazole (GYNE-LOTRIMIN 3) 2 % vaginal cream Place 1 Applicatorful vaginally at bedtime. 11/08/15  Yes Reyne Dumas, MD  divalproex (DEPAKOTE ER) 500 MG 24 hr tablet Take 500 mg by mouth at bedtime.  02/22/12  Yes Historical Provider, MD  feeding supplement, ENSURE ENLIVE, (ENSURE ENLIVE) LIQD Take 237 mLs by mouth 2 (two) times daily between meals. 11/08/15  Yes Reyne Dumas, MD  fluticasone (FLONASE) 50 MCG/ACT nasal spray PLACE 2 SPRAYS INTO BOTH NOSTRILS DAILY. 12/31/15  Yes Brand Males, MD  furosemide (LASIX) 20 MG tablet TAKE 2 TABLETS BY MOUTH EVERY DAY 05/25/15  Yes [name], MD  guaiFENesin (MUCINEX) 600 MG 12 hr tablet Take 1 tablet (600 mg total) by mouth 2 (two)  times daily. 11/08/15  Yes Reyne Dumas, MD  HYDROcodone-acetaminophen (NORCO/VICODIN) 5-325 MG tablet Take 1 tablet by mouth every 6 (six) hours as needed for moderate pain. 12/12/15  Yes Wardell Honour, MD  ipratropium (ATROVENT) 0.03 % nasal spray Place 2 sprays into both nostrils 2 (two) times daily. Reported on 12/19/2015 07/16/15  Yes Historical Provider, MD  mometasone-formoterol (DULERA) 100-5 MCG/ACT AERO Take 2 puffs first thing in am and then another 2 puffs about 12 hours later. 09/27/15  Yes Tanda Rockers, MD  Multiple Vitamin (MULTIVITAMIN) tablet Take 1 tablet by mouth daily. Reported on 12/12/2015   Yes Historical Provider, MD  nadolol (CORGARD) 40 MG tablet Take 20 mg by mouth daily.  09/28/15  Yes Historical Provider, MD  nortriptyline (PAMELOR) 25 MG capsule Take 1 capsule (25 mg total) by mouth at bedtime. Patient taking differently: Take 50 mg by mouth at bedtime.  11/08/15  Yes Reyne Dumas, MD  potassium chloride (KLOR-CON) 20 MEQ packet Take 40 mEq by mouth daily. 11/08/15  Yes Reyne Dumas, MD  saccharomyces boulardii (FLORASTOR) 250 MG capsule Take 1 capsule (250 mg total) by mouth 2 (two) times daily. 08/21/15  Yes Charlynne Cousins, MD  tiZANidine (ZANAFLEX) 4 MG tablet Take 1 tablet (4 mg total) by mouth every 8 (eight) hours as needed for muscle spasms. 12/12/15  Yes Wardell Honour, MD  albuterol (PROVENTIL) (2.5 MG/3ML) 0.083% nebulizer solution Take 3 mLs (2.5 mg total) by nebulization every 2 (two) hours as needed for wheezing. Patient not taking: Reported on 12/19/2015 11/08/15   Reyne Dumas, MD     ROS: The patient denies fevers, chills, night sweats, unintentional weight loss, chest pain,  nausea, vomiting, abdominal pain, dysuria, hematuria, melena, numbness,  or tingling.   All other systems have been reviewed and were otherwise negative with the exception of those mentioned in the HPI and as above.    PHYSICAL EXAM: Filed Vitals:   01/09/16 1608  BP: 130/80  Pulse: 91  Temp: 98.5 F (36.9 C)  Resp: 20   Body mass index is 23.73 kg/(m^2).   General: Alert, mild acute distress HEENT:  Normocephalic, atraumatic, oropharynx patent. EOMI, PERRLA Cardiovascular:  Regular rate and rhythm, no rubs murmurs or gallops.  No Carotid bruits, radial pulse intact. No pedal edema.  Respiratory: Clear to auscultation bilaterally.  + wheezes,no  rales, or + rhonchi.  + lower extremity cyanosis, + use of accessory musculature Abdominal: No organomegaly, abdomen is soft and non-tender, positive bowel sounds. No masses. Skin: No rashes. Neurologic: Facial musculature symmetric. Psychiatric: Patient acts appropriately throughout our interaction. Lymphatic: No cervical or submandibular lymphadenopathy Musculoskeletal: Gait slowed . No edema, tenderness   LABS: Results for orders placed or performed in visit on 01/09/16  POCT CBC  Result Value Ref Range   WBC 13.6 (A) 4.6 - 10.2 K/uL   Lymph, poc 2.3 0.6 - 3.4   POC LYMPH PERCENT 17.1 10 - 50 %L   MID (cbc) 0.8 0 - 0.9   POC MID % 6.2 0 - 12 %M   POC Granulocyte 10.4 (A) 2 - 6.9   Granulocyte percent 76.7 37 - 80 %G   RBC 4.90 4.04 - 5.48 M/uL   Hemoglobin  13.9 12.2 - 16.2 g/dL   HCT, POC 41.2 37.7 - 47.9 %   MCV 84.1 80 - 97 fL   MCH, POC 28.4 27 - 31.2 pg   MCHC 33.8 31.8 - 35.4 g/dL   RDW, POC  15.3 %   Platelet Count, POC 330 142 - 424 K/uL   MPV 7.2 0 - 99.8 fL  POCT glucose (manual entry)  Result Value Ref Range   POC Glucose 128 (A) 70 - 99 mg/dl     EKG/XRAY:   Primary read interpreted by Dr. Marin Comment at [facility].   ASSESSMENT/PLAN: Encounter Diagnoses  Name Primary?  . Shortness of breath Yes  . Dizziness and giddiness   . Hypoxia   . CAP (community acquired pneumonia)   . COPD GOLD II/ still smoking    65 y/o female with COPD not on oxygen at home with  recent hospitalization in mid January for necrotizing PNA, she is not better, worsning SOB and wheezing and weakness.  COPD exacerbation in setting of recent PNA.  She deneis any CP, recent CT scan today showing resolving PNA , please CT results  She was given 2 neb treatments, solumedrol 125 mg IM x 1 and was still hypoxic at 81% spo2.  She was able to maintain 92-95% on 2L Will transfer to [facility] ER by EMS , [place] notified.   Gross sideeffects, risk and benefits, and alternatives of medications d/w patient. Patient is aware that all medications have potential sideeffects and we are unable to predict every sideeffect or drug-drug interaction that may occur.    DO  01/09/2016 6:28 PM

## 2016-01-09 NOTE — Progress Notes (Signed)
ABG collected  

## 2016-01-09 NOTE — ED Notes (Signed)
1st attempt to call report 

## 2016-01-09 NOTE — Consult Note (Signed)
Pharmacy Antibiotic Note  Catherine Kelley is a 65 y.o. female admitted on 01/09/2016 with pneumonia.  Pharmacy has been consulted for vancomycin dosing. MD ordered aztreonam as well for HCAP.   Pt recently admitted on 11/01/15 and treated for CAP. Sputum culture on 12/2 showed abundant GNRs w/ rare pseudomonas (pan sensitive). Pt received ceftazidime 12/1-12/8 and vanc 12/1-12/7 then switched to cipro for discharge to complete 4 weeks of treatment. Discharged 11/08/15. Also has a hx of necrotizing PNA in 2015.  Plan: Vanc 1 g IV x1, Aztreonam 2 g IV x1 per MD Pharmacy to follow up labs for maintenance dosing  Temp (24hrs), Avg:98.5 F (36.9 C), Min:98.5 F (36.9 C), Max:98.5 F (36.9 C)   Recent Labs Lab 01/09/16 1710  WBC 13.6*     Antimicrobials this admission: Aztreonam 2/8 >> Vanc 2/8  >>   Dose adjustments this admission: n/a  Microbiology results: 2/8 BCx: sent  Thank you for allowing pharmacy to be a part of this patient's care.  Joya San, PharmD Clinical Pharmacy Resident Pager # [phone] 01/09/2016 8:58 PM

## 2016-01-09 NOTE — Consult Note (Signed)
Pharmacy Antibiotic Note  Catherine Kelley is a 65 y.o. female admitted on 01/09/2016 with pneumonia.  Pharmacy has been consulted for vancomycin dosing. MD ordered aztreonam as well for HCAP.   Pt recently admitted on 11/01/15 and treated for CAP. Sputum culture on 12/2 showed abundant GNRs w/ rare pseudomonas (pan sensitive). Pt received ceftazidime 12/1-12/8 and vanc 12/1-12/7 then switched to cipro for discharge to complete 4 weeks of treatment. Discharged 11/08/15. Also has a hx of necrotizing PNA in 2015.  Plan: Continue Vancomycin 500 mg IV Q 12 hours.  F/u continuation of gram negative coverage   Temp (24hrs), Avg:98.5 F (36.9 C), Min:98.5 F (36.9 C), Max:98.5 F (36.9 C)   Recent Labs Lab 01/09/16 1710 01/09/16 2045 01/09/16 2133  WBC 13.6* 9.8  --   CREATININE  --  0.47  --   LATICACIDVEN  --   --  1.41     Antimicrobials this admission: Aztreonam 2/8 >> Vanc 2/8  >>   Dose adjustments this admission: n/a  Microbiology results: 2/8 BCx: sent  Thank you for allowing pharmacy to be a part of this patient's care.  Albertina Parr, PharmD., BCPS Clinical Pharmacist Pager [phone]

## 2016-01-09 NOTE — H&P (Signed)
[facility] History and Physical  Catherine Kelley FUX:[id number] DOB: [birth year] DOA: 01/09/2016  Referring physician: Ezequiel Essex, MD PCP: Catherine Forts, MD   Chief Complaint: Shortness of breath.  HPI: Catherine Kelley is a 65 y.o. female with a past medical history of hypertension, COPD, anxiety, depression, tobacco abuse disorder who comes from the urgent care center due to fever, progressively worse dyspnea, productive cough and wheezing after treatment there with bronchodilators and corticosteroids did not relieve her hypoxia so she was sent to the emergency department.   Per patient, since several days ago she has been having progressively worse dyspnea, with wheezing, cough with increased yellowish mucus production, fever, chills and fatigue. She denies earaches, sore throat, pleuritic chest pain, precordial chest pain, dizziness, palpitations, diaphoresis or pitting edema of the lower extremities. She denies abdominal pain, had an episode of nausea and emesis on Saturday, but denies further symptoms since then, has decreased appetite and thinks that she has lost some weight over the past few months.  When seen in the emergency department, patient was in no acute distress, she stated that she felt a lot better than earlier, but still felt some fatigue. Trial of ambulation earlier showed desaturation in the 80s.  Workup in the emergency department shows an ABG with mild CO2 retention and mild hypoxia, hyponatremia, hypokalemia. Her imaging shows significant abnormalities from previous and necrotizing pneumonia.    Review of Systems:  Constitutional:  Positive chills, fatigue, weight loss.  No night sweats, Fevers.  HEENT:  Sore throat, ear ache, nasal congestion No headaches, Difficulty swallowing,Tooth/dental problems,  No sneezing, itching, , post nasal drip,  Cardio-vascular:  swelling in lower extremities, dizziness, palpitations  No chest pain, Orthopnea, PND,  anasarca,     GI:  Nausea and vomiting x1 Saturday.   change in bowel habits, loss of appetite  No heartburn, indigestion, abdominal pain, , diarrhea,  Resp:  Positive dyspnea, productive cough of yellowish sputum, wheezing, no hemoptysis. Skin:  no rash or lesions.  GU:  no dysuria, change in color of urine, no urgency or frequency. No flank pain.  Musculoskeletal:  No joint pain or swelling. No decreased range of motion. No back pain.  Psych:  History of depression and anxiety. No change in mood or affect.  No memory loss.   Past Medical History  Diagnosis Date  . Hypertension     essential hypertension  . Tobacco abuse     smoking about 1/2 pk of cigarettes a day  . Anxiety   . Depression     psychiatrist Dr. Toy Care  . Emphysematous COPD ([place])     changes in right base along with patchy areas on last x-ray  . COPD (chronic obstructive pulmonary disease) ([place])   . History of echocardiogram 12/24/2006    Est. EF of 02-54% NORMAL LV SYSTOLIC FUNCTION WITH IMPAIRED RELAXATION -- MILD AORTIC SCLEROSIS -- NORMAL PALONARY ARTERY PRESSURE -- NO OLD ECHOS FOR COMPARISON -- [name], MD  . History of cardiovascular stress test 08/15/2004    EF of 70% -- Normal stress cardiolite.  There is no evidence of ischemia and there is normal LV function. -- [name]. MD  . Diastolic dysfunction   . Allergy   . Asthma   . Heart murmur   . Pneumonia     "several times since May 2015" (07/06/2014)  . Chronic bronchitis ([place])     "get it alot; maybe not q yr" (07/06/2014)  . GERD (gastroesophageal reflux disease)  Past Surgical History  Procedure Laterality Date  . Tubal ligation  1986   Social History:  reports that she has been smoking Cigarettes.  She has a 42 pack-year smoking history. She has never used smokeless tobacco. She reports that she drinks alcohol. She reports that she does not use illicit drugs.  Allergies  Allergen Reactions  . Ceclor [Cefaclor] Hives  . Escitalopram  Oxalate     Pt does not recall ever taking medication  . Sertraline Hcl     Severe Headache  . Sulfa Drugs Cross Reactors Hives and Rash    Hives and rash    Family History  Problem Relation Age of Onset  . Stroke Mother   . Heart disease Father   . Hyperlipidemia Father   . Hypertension Father   . Breast cancer Maternal Aunt   . Asthma Maternal Grandmother     Prior to Admission medications   Medication Sig Start Date End Date Taking? Authorizing Provider  ALPRAZolam Duanne Moron) 0.5 MG tablet Take 0.5 mg by mouth at bedtime.    Yes Historical Provider, MD  clotrimazole (GYNE-LOTRIMIN 3) 2 % vaginal cream Place 1 Applicatorful vaginally at bedtime. Patient taking differently: Place 1 Applicatorful vaginally at bedtime as needed (itching).  11/08/15  Yes Reyne Dumas, MD  divalproex (DEPAKOTE ER) 500 MG 24 hr tablet Take 500 mg by mouth at bedtime.  02/22/12  Yes Historical Provider, MD  furosemide (LASIX) 20 MG tablet TAKE 2 TABLETS BY MOUTH EVERY DAY Patient taking differently: Take 20 mg by mouth daily 05/25/15  Yes [name], MD  guaiFENesin (MUCINEX) 600 MG 12 hr tablet Take 1 tablet (600 mg total) by mouth 2 (two) times daily. 11/08/15  Yes Reyne Dumas, MD  HYDROcodone-acetaminophen (NORCO/VICODIN) 5-325 MG tablet Take 1 tablet by mouth every 6 (six) hours as needed for moderate pain. Patient taking differently: Take 0.5-1 tablets by mouth every 6 (six) hours as needed for moderate pain.  12/12/15  Yes Wardell Honour, MD  ipratropium (ATROVENT) 0.03 % nasal spray Place 2 sprays into both nostrils 2 (two) times daily. Reported on 12/19/2015 07/16/15  Yes Historical Provider, MD  mometasone-formoterol (DULERA) 100-5 MCG/ACT AERO Take 2 puffs first thing in am and then another 2 puffs about 12 hours later. 09/27/15  Yes Tanda Rockers, MD  nadolol (CORGARD) 40 MG tablet Take 20 mg by mouth daily.  09/28/15  Yes Historical Provider, MD  nortriptyline (PAMELOR) 50 MG capsule Take 50 mg  by mouth at bedtime. 11/12/15  Yes Historical Provider, MD  tiZANidine (ZANAFLEX) 4 MG tablet Take 1 tablet (4 mg total) by mouth every 8 (eight) hours as needed for muscle spasms. 12/12/15  Yes Wardell Honour, MD  albuterol (PROVENTIL) (2.5 MG/3ML) 0.083% nebulizer solution Take 3 mLs (2.5 mg total) by nebulization every 2 (two) hours as needed for wheezing. Patient not taking: Reported on 12/19/2015 11/08/15   Reyne Dumas, MD  benzonatate (TESSALON) 100 MG capsule TAKE 1 TO 2 CAPSULES BY MOUTH 3 TIMES DAILY AS NEEDED FOR COUGH Patient not taking: Reported on 01/09/2016 10/03/15   Wardell Honour, MD  cefdinir (OMNICEF) 300 MG capsule Take 2 capsules (600 mg total) by mouth daily. Patient not taking: Reported on 01/09/2016 12/12/15   Wardell Honour, MD  feeding supplement, ENSURE ENLIVE, (ENSURE ENLIVE) LIQD Take 237 mLs by mouth 2 (two) times daily between meals. Patient not taking: Reported on 01/09/2016 11/08/15   Reyne Dumas, MD  fluticasone (FLONASE) 50 MCG/ACT  nasal spray PLACE 2 SPRAYS INTO BOTH NOSTRILS DAILY. Patient not taking: Reported on 01/09/2016 12/31/15   Brand Males, MD  nortriptyline (PAMELOR) 25 MG capsule Take 1 capsule (25 mg total) by mouth at bedtime. Patient not taking: Reported on 01/09/2016 11/08/15   Reyne Dumas, MD  potassium chloride (KLOR-CON) 20 MEQ packet Take 40 mEq by mouth daily. Patient not taking: Reported on 01/09/2016 11/08/15   Reyne Dumas, MD  saccharomyces boulardii (FLORASTOR) 250 MG capsule Take 1 capsule (250 mg total) by mouth 2 (two) times daily. Patient not taking: Reported on 01/09/2016 08/21/15   Charlynne Cousins, MD   Physical Exam: Filed Vitals:   01/09/16 2130 01/09/16 2145 01/09/16 2200 01/09/16 2215  BP: 125/57 135/69 116/57 125/64  Pulse: 81 78 85 80  Temp:      TempSrc:      Resp: '19 19 18 19  '$ SpO2: 94% 94% 95% 94%    Wt Readings from Last 3 Encounters:  01/09/16 58.877 kg (129 lb 12.8 oz)  12/19/15 59.875 kg (132 lb)  12/12/15 59.421 kg  (131 lb)    General:  Appears calm and comfortable Eyes: PERRL, normal lids, irises & conjunctiva ENT: grossly normal hearing, lips & tongue. Oral mucosa is mildly dry. Neck: no LAD, masses or thyromegaly Cardiovascular: RRR, no m/r/g. No LE edema. Telemetry: SR, no arrhythmias  Respiratory: Decreased breath sounds and crackles on both bases, bilateral wheezing and                      rhonchi on all lungs fields. No accessory muscle use. Abdomen: soft, ntnd Skin: no rash or induration seen on limited exam Musculoskeletal: grossly normal tone BUE/BLE Psychiatric: grossly normal mood and affect, speech fluent and appropriate Neurologic: Awake, alert, oriented 3, grossly non-focal.          Labs on Admission:  Basic Metabolic Panel:  Recent Labs Lab 01/09/16 1706 01/09/16 2045  NA 124* 131*  K 5.1 3.4*  CL 82* 88*  CO2 32* 31  GLUCOSE 108* 114*  BUN 6* <5*  CREATININE 0.52 0.47  CALCIUM 8.9 8.9   Liver Function Tests:  Recent Labs Lab 01/09/16 1706  AST 42*  ALT 15  ALKPHOS 109  BILITOT 0.6  PROT 8.6*  ALBUMIN 3.0*    CBC:  Recent Labs Lab 01/09/16 1710 01/09/16 2045  WBC 13.6* 9.8  NEUTROABS  --  8.6*  HGB 13.9 12.6  HCT 41.2 37.0  MCV 84.1 84.7  PLT  --  269   Cardiac Enzymes:  Recent Labs Lab 01/09/16 2045  TROPONINI <0.03    Radiological Exams on Admission: Dg Chest 2 View  01/09/2016  CLINICAL DATA:  Cough, shortness of breath, dizziness, wheezing EXAM: CHEST  2 VIEW COMPARISON:  CT chest 01/09/2016 FINDINGS: There is right lower lobe and right middle lobe airspace disease without significant interval change compared with 12/12/2015. There is no pleural effusion or pneumothorax. The heart and mediastinal contours are unremarkable. The osseous structures are unremarkable. IMPRESSION: Lower lobe and right middle lobe airspace disease without significant interval change compared with 12/12/2015. Electronically Signed   By: Kathreen Devoid   On:  01/09/2016 20:22   Ct Chest Wo Contrast  01/09/2016  CLINICAL DATA:  65 year old female with prior history of pneumonia (hospitalized in December 2016), with worsening cough, congestion, wheezing and shortness of breath. EXAM: CT CHEST WITHOUT CONTRAST TECHNIQUE: Multidetector CT imaging of the chest was performed following the standard protocol without  IV contrast. COMPARISON:  Multiple priors, most recently chest CT 11/03/2015. FINDINGS: Mediastinum/Lymph Nodes: Heart size is normal. There is no significant pericardial fluid, thickening or pericardial calcification. There is atherosclerosis of the thoracic aorta, the great vessels of the mediastinum and the coronary arteries, including calcified atherosclerotic plaque in the distal left main, left anterior descending and left circumflex coronary arteries. Prominent soft tissue in the right hilar region likely reflects underlying lymphoid enlargement (which cannot be measured accurately on today's noncontrast CT examination). Enlarged subcarinal lymph node measuring 13 mm in short axis. Enlarged low right paratracheal lymph node measuring up to 15 mm in short axis. High right paratracheal lymph node measuring up to 15 mm in short axis. Esophagus is unremarkable in appearance. No axillary lymphadenopathy. Lungs/Pleura: Compared to the prior examination from 11/03/2015 the airspace consolidation in the right lower lobe and to a lesser extent in the right middle lobe has largely resolved. There continues to be extensive areas in varicose and cystic bronchiectasis throughout the right lower and right middle lobes, where these bronchiectatic bronchi are largely filled with retained secretions. In the right lower lobe there also several cavitary areas, largest of which has thick walls and measures up to 3.3 x 2.2 cm (image 32 of series 3). Diffuse bronchial wall thickening. Patchy areas of more severe thickening of the peribronchovascular interstitium with some  peribronchovascular ground-glass attenuation noted throughout the lungs bilaterally, most evident in the anterior aspects of the upper lobes of the lungs where there is also some very subtle peribronchovascular tree-in-bud micronodularity, which likely reflects areas of mucoid impaction within terminal bronchioles. Mild centrilobular and paraseptal emphysema. New ground-glass attenuation nodule in the medial aspect of the left upper lobe (image 7 of series 3) measuring 12 x 13 mm. No pleural effusions. Upper Abdomen: Unremarkable. Musculoskeletal/Soft Tissues: There are no aggressive appearing lytic or blastic lesions noted in the visualized portions of the skeleton. IMPRESSION: 1. Sequela of prior necrotizing pneumonia in the right lower and middle lobes redemonstrated with multiple cavitary areas and extensive varicose and cystic bronchiectasis, similar to prior examinations. Compared to the most recent prior study, the extent of associated airspace consolidation has significantly improved. Right hilar and mediastinal lymphadenopathy, similar to prior examinations, likely reactive. 2. Mild centrilobular and paraseptal emphysema; imaging findings suggestive of underlying COPD. 3. Atherosclerosis, including left main and 2 vessel coronary artery disease. Please note that although the presence of coronary artery calcium documents the presence of coronary artery disease, the severity of this disease and any potential stenosis cannot be assessed on this non-gated CT examination. Assessment for potential risk factor modification, dietary therapy or pharmacologic therapy may be warranted, if clinically indicated. 4. Additional incidental findings, as above. Electronically Signed   By: Vinnie Langton M.D.   On: 01/09/2016 15:43    EKG: Independently reviewed. Vent. rate 83 BPM PR interval 174 ms QRS duration 87 ms QT/QTc 378/444 ms P-R-T axes 84 65 55 Sinus rhythm Baseline wander in lead(s) I II aVR aVL  V2  Assessment/Plan Principal Problem:   HCAP (healthcare-associated pneumonia)   Respiratory failure with hypoxia (HCC)   COPD, moderate (HCC)   Bronchiectasis with acute exacerbation (HCC) Admit to telemetry. Continue supplemental oxygen. Continue bronchodilators. methylprednisolone 125 mg IVP. Check sputum Gram stain, culture and sensitivity. Follow-up blood cultures and sensitivity. Check urine Legionella and strep. pneumoniae antigens. Continue aztreonam and vancomycin.  Active Problems:   Depression Continue nortriptyline 50 mg by mouth at bedtime. Continue Depakote ER 500 mg by mouth  at bedtime.    Anxiety Continue alprazolam as needed.    Hyponatremia Level was 124 mmol per liter earlier. Repeat level was 131 mmol per liter. Received gentle hydration with NS earlier. Hold furosemide for now. Check urine random sodium, potassium and osmolality. Follow-up sodium level in the morning.    Mild hypokalemia Replacing. Check potassium level in the morning.      Essential hypertension Continue Nadolol 20 mg by mouth daily      Tobacco use disorder Nicotine replacement therapy was ordered    Code Status: Full code. DVT Prophylaxis: Lovenox SQ. Family Communication:  Disposition Plan: Admit to telemetry for IV antibiotic therapy, bronchodilators.  Time spent: Over 70 minutes were spent in the process of this admission.  Reubin Milan, M.D. [facility] Pager [phone].

## 2016-01-09 NOTE — ED Notes (Signed)
Pt sts she "coughed so hard I peed myself".  Linens changed, peri care done

## 2016-01-09 NOTE — ED Notes (Signed)
Pt ambulated in hall & to bathroom. SpO2 dropped to 85% on RA. Improved to 90% on 2L O2 via [place].

## 2016-01-09 NOTE — ED Notes (Signed)
Pt reports to the ED from the [facility] where she was seen for increasing SOB and a productive cough. She has had problems with chronic bronchitis and PNA x 2-3 years with the most recent dx a month ago here. She has had many different abx and treatments without relief. They did a chest X-ray and found she still has PNA. Rhonchi noted to auscultation. Pt A&Ox4, resp e/u, and skin warm and dry.

## 2016-01-09 NOTE — ED Provider Notes (Signed)
CSN: [id number]     Arrival date & time 01/09/16  1909 History   First MD Initiated Contact with Patient 01/09/16 1912     Chief Complaint  Patient presents with  . Shortness of Breath     (Consider location/radiation/quality/duration/timing/severity/associated sxs/prior Treatment) HPI Comments: Patient with history of COPD and bronchiectasis as well as necrotizing pneumonia in December presenting from urgent care with worsening cough and shortness of breath over the past several days. She is given nebulizers and steroids urgent care and sent to the ED for hypoxia. She does not wear oxygen at home. She states she has a cough productive of yellow mucus. Had a fever to 102 days ago. Good by mouth intake and urine output. No nausea or vomiting. No abdominal pain. No chest pain. She had an outpatient CT today that showed improvement in her air space disease.  Patient is a 65 y.o. female presenting with shortness of breath. The history is provided by the patient and the EMS personnel. The history is limited by the condition of the patient.  Shortness of Breath Associated symptoms: cough   Associated symptoms: no abdominal pain, no chest pain, no fever, no headaches, no rash and no vomiting     Past Medical History  Diagnosis Date  . Hypertension     essential hypertension  . Tobacco abuse     smoking about 1/2 pk of cigarettes a day  . Anxiety   . Depression     psychiatrist Dr. Toy Care  . Emphysematous COPD ([place])     changes in right base along with patchy areas on last x-ray  . COPD (chronic obstructive pulmonary disease) ([place])   . History of echocardiogram 12/24/2006    Est. EF of 32-20% NORMAL LV SYSTOLIC FUNCTION WITH IMPAIRED RELAXATION -- MILD AORTIC SCLEROSIS -- NORMAL PALONARY ARTERY PRESSURE -- NO OLD ECHOS FOR COMPARISON -- [name], MD  . History of cardiovascular stress test 08/15/2004    EF of 70% -- Normal stress cardiolite.  There is no evidence of ischemia and there is  normal LV function. -- [name]. MD  . Diastolic dysfunction   . Allergy   . Asthma   . Heart murmur   . Pneumonia     "several times since May 2015" (07/06/2014)  . Chronic bronchitis ([place])     "get it alot; maybe not q yr" (07/06/2014)  . GERD (gastroesophageal reflux disease)    Past Surgical History  Procedure Laterality Date  . Tubal ligation  1986   Family History  Problem Relation Age of Onset  . Stroke Mother   . Heart disease Father   . Hyperlipidemia Father   . Hypertension Father   . Breast cancer Maternal Aunt   . Asthma Maternal Grandmother    Social History  Substance Use Topics  . Smoking status: Current Every Day Smoker -- 1.00 packs/day for 42 years    Types: Cigarettes    Last Attempt to Quit: 10/18/2015  . Smokeless tobacco: Never Used  . Alcohol Use: 0.0 oz/week    0 Standard drinks or equivalent per week     Comment: sicial   OB History    No data available     Review of Systems  Constitutional: Negative for fever, activity change, appetite change and fatigue.  HENT: Positive for congestion and rhinorrhea.   Eyes: Negative for visual disturbance.  Respiratory: Positive for cough and shortness of breath.   Cardiovascular: Negative for chest pain.  Gastrointestinal: Negative  for nausea, vomiting and abdominal pain.  Genitourinary: Negative for dysuria, hematuria, vaginal bleeding and vaginal discharge.  Musculoskeletal: Negative for myalgias and arthralgias.  Skin: Negative for rash.  Neurological: Negative for dizziness, weakness, light-headedness, numbness and headaches.  A complete 10 system review of systems was obtained and all systems are negative except as noted in the HPI and PMH.      Allergies  Ceclor; Escitalopram oxalate; Sertraline hcl; and Sulfa drugs cross reactors  Home Medications   Prior to Admission medications   Medication Sig Start Date End Date Taking? Authorizing Provider  ALPRAZolam Duanne Moron) 0.5 MG tablet  Take 0.5 mg by mouth at bedtime.    Yes Historical Provider, MD  clotrimazole (GYNE-LOTRIMIN 3) 2 % vaginal cream Place 1 Applicatorful vaginally at bedtime. Patient taking differently: Place 1 Applicatorful vaginally at bedtime as needed (itching).  11/08/15  Yes Reyne Dumas, MD  divalproex (DEPAKOTE ER) 500 MG 24 hr tablet Take 500 mg by mouth at bedtime.  02/22/12  Yes Historical Provider, MD  furosemide (LASIX) 20 MG tablet TAKE 2 TABLETS BY MOUTH EVERY DAY Patient taking differently: Take 20 mg by mouth daily 05/25/15  Yes [name], MD  guaiFENesin (MUCINEX) 600 MG 12 hr tablet Take 1 tablet (600 mg total) by mouth 2 (two) times daily. 11/08/15  Yes Reyne Dumas, MD  HYDROcodone-acetaminophen (NORCO/VICODIN) 5-325 MG tablet Take 1 tablet by mouth every 6 (six) hours as needed for moderate pain. Patient taking differently: Take 0.5-1 tablets by mouth every 6 (six) hours as needed for moderate pain.  12/12/15  Yes Wardell Honour, MD  ipratropium (ATROVENT) 0.03 % nasal spray Place 2 sprays into both nostrils 2 (two) times daily. Reported on 12/19/2015 07/16/15  Yes Historical Provider, MD  mometasone-formoterol (DULERA) 100-5 MCG/ACT AERO Take 2 puffs first thing in am and then another 2 puffs about 12 hours later. 09/27/15  Yes Tanda Rockers, MD  nadolol (CORGARD) 40 MG tablet Take 20 mg by mouth daily.  09/28/15  Yes Historical Provider, MD  nortriptyline (PAMELOR) 50 MG capsule Take 50 mg by mouth at bedtime. 11/12/15  Yes Historical Provider, MD  tiZANidine (ZANAFLEX) 4 MG tablet Take 1 tablet (4 mg total) by mouth every 8 (eight) hours as needed for muscle spasms. 12/12/15  Yes Wardell Honour, MD  albuterol (PROVENTIL) (2.5 MG/3ML) 0.083% nebulizer solution Take 3 mLs (2.5 mg total) by nebulization every 2 (two) hours as needed for wheezing. Patient not taking: Reported on 12/19/2015 11/08/15   Reyne Dumas, MD  benzonatate (TESSALON) 100 MG capsule TAKE 1 TO 2 CAPSULES BY MOUTH 3 TIMES DAILY  AS NEEDED FOR COUGH Patient not taking: Reported on 01/09/2016 10/03/15   Wardell Honour, MD  cefdinir (OMNICEF) 300 MG capsule Take 2 capsules (600 mg total) by mouth daily. Patient not taking: Reported on 01/09/2016 12/12/15   Wardell Honour, MD  feeding supplement, ENSURE ENLIVE, (ENSURE ENLIVE) LIQD Take 237 mLs by mouth 2 (two) times daily between meals. Patient not taking: Reported on 01/09/2016 11/08/15   Reyne Dumas, MD  fluticasone (FLONASE) 50 MCG/ACT nasal spray PLACE 2 SPRAYS INTO BOTH NOSTRILS DAILY. Patient not taking: Reported on 01/09/2016 12/31/15   Brand Males, MD  nortriptyline (PAMELOR) 25 MG capsule Take 1 capsule (25 mg total) by mouth at bedtime. Patient not taking: Reported on 01/09/2016 11/08/15   Reyne Dumas, MD  potassium chloride (KLOR-CON) 20 MEQ packet Take 40 mEq by mouth daily. Patient not taking: Reported on 01/09/2016  11/08/15   Reyne Dumas, MD  saccharomyces boulardii (FLORASTOR) 250 MG capsule Take 1 capsule (250 mg total) by mouth 2 (two) times daily. Patient not taking: Reported on 01/09/2016 08/21/15   Charlynne Cousins, MD   BP 124/52 mmHg  Pulse 87  Temp(Src) 97.3 F (36.3 C) (Oral)  Resp 20  Ht '5\' 3"'$  (1.6 m)  Wt 123 lb 4.8 oz (55.929 kg)  BMI 21.85 kg/m2  SpO2 95% Physical Exam  Constitutional: She is oriented to person, place, and time. She appears well-developed and well-nourished. No distress.  Speaking in full sentences  HENT:  Head: Normocephalic and atraumatic.  Mouth/Throat: Oropharynx is clear and moist. No oropharyngeal exudate.  Eyes: Conjunctivae and EOM are normal. Pupils are equal, round, and reactive to light.  Neck: Normal range of motion. Neck supple.  No meningismus.  Cardiovascular: Normal rate, regular rhythm, normal heart sounds and intact distal pulses.   No murmur heard. Pulmonary/Chest: Effort normal and breath sounds normal. No respiratory distress.  Moderate air exchange, rhonchi throuhgout  Abdominal: Soft. There is no  tenderness. There is no rebound and no guarding.  Musculoskeletal: Normal range of motion. She exhibits no edema or tenderness.  Neurological: She is alert and oriented to person, place, and time. No cranial nerve deficit. She exhibits normal muscle tone. Coordination normal.  No ataxia on finger to nose bilaterally. No pronator drift. 5/5 strength throughout. CN 2-12 intact.Equal grip strength. Sensation intact.   Skin: Skin is warm.  Psychiatric: She has a normal mood and affect. Her behavior is normal.  Nursing note and vitals reviewed.   ED Course  Procedures (including critical care time) Labs Review Labs Reviewed  CBC WITH DIFFERENTIAL/PLATELET - Abnormal; Notable for the following:    Neutro Abs 8.6 (*)    All other components within normal limits  BASIC METABOLIC PANEL - Abnormal; Notable for the following:    Sodium 131 (*)    Potassium 3.4 (*)    Chloride 88 (*)    Glucose, Bld 114 (*)    BUN <5 (*)    All other components within normal limits  URINALYSIS, ROUTINE W REFLEX MICROSCOPIC (NOT AT [facility]) - Abnormal; Notable for the following:    Specific Gravity, Urine 1.004 (*)    Ketones, ur 15 (*)    All other components within normal limits  I-STAT ARTERIAL BLOOD GAS, ED - Abnormal; Notable for the following:    pCO2 arterial 47.6 (*)    pO2, Arterial 62.0 (*)    Bicarbonate 32.7 (*)    Acid-Base Excess 7.0 (*)    All other components within normal limits  CULTURE, BLOOD (ROUTINE X 2)  CULTURE, BLOOD (ROUTINE X 2)  CULTURE, BLOOD (ROUTINE X 2)  CULTURE, BLOOD (ROUTINE X 2)  CULTURE, EXPECTORATED SPUTUM-ASSESSMENT  GRAM STAIN  TROPONIN I  CBC  CREATININE, SERUM  CBC WITH DIFFERENTIAL/PLATELET  STREP PNEUMONIAE URINARY ANTIGEN  LEGIONELLA ANTIGEN, URINE  COMPREHENSIVE METABOLIC PANEL  I-STAT CG4 LACTIC ACID, ED  I-STAT CG4 LACTIC ACID, ED    Imaging Review Dg Chest 2 View  01/09/2016  CLINICAL DATA:  Cough, shortness of breath, dizziness, wheezing EXAM: CHEST   2 VIEW COMPARISON:  CT chest 01/09/2016 FINDINGS: There is right lower lobe and right middle lobe airspace disease without significant interval change compared with 12/12/2015. There is no pleural effusion or pneumothorax. The heart and mediastinal contours are unremarkable. The osseous structures are unremarkable. IMPRESSION: Lower lobe and right middle lobe airspace disease without significant  interval change compared with 12/12/2015. Electronically Signed   By: Kathreen Devoid   On: 01/09/2016 20:22   Ct Chest Wo Contrast  01/09/2016  CLINICAL DATA:  65 year old female with prior history of pneumonia (hospitalized in December 2016), with worsening cough, congestion, wheezing and shortness of breath. EXAM: CT CHEST WITHOUT CONTRAST TECHNIQUE: Multidetector CT imaging of the chest was performed following the standard protocol without IV contrast. COMPARISON:  Multiple priors, most recently chest CT 11/03/2015. FINDINGS: Mediastinum/Lymph Nodes: Heart size is normal. There is no significant pericardial fluid, thickening or pericardial calcification. There is atherosclerosis of the thoracic aorta, the great vessels of the mediastinum and the coronary arteries, including calcified atherosclerotic plaque in the distal left main, left anterior descending and left circumflex coronary arteries. Prominent soft tissue in the right hilar region likely reflects underlying lymphoid enlargement (which cannot be measured accurately on today's noncontrast CT examination). Enlarged subcarinal lymph node measuring 13 mm in short axis. Enlarged low right paratracheal lymph node measuring up to 15 mm in short axis. High right paratracheal lymph node measuring up to 15 mm in short axis. Esophagus is unremarkable in appearance. No axillary lymphadenopathy. Lungs/Pleura: Compared to the prior examination from 11/03/2015 the airspace consolidation in the right lower lobe and to a lesser extent in the right middle lobe has largely  resolved. There continues to be extensive areas in varicose and cystic bronchiectasis throughout the right lower and right middle lobes, where these bronchiectatic bronchi are largely filled with retained secretions. In the right lower lobe there also several cavitary areas, largest of which has thick walls and measures up to 3.3 x 2.2 cm (image 32 of series 3). Diffuse bronchial wall thickening. Patchy areas of more severe thickening of the peribronchovascular interstitium with some peribronchovascular ground-glass attenuation noted throughout the lungs bilaterally, most evident in the anterior aspects of the upper lobes of the lungs where there is also some very subtle peribronchovascular tree-in-bud micronodularity, which likely reflects areas of mucoid impaction within terminal bronchioles. Mild centrilobular and paraseptal emphysema. New ground-glass attenuation nodule in the medial aspect of the left upper lobe (image 7 of series 3) measuring 12 x 13 mm. No pleural effusions. Upper Abdomen: Unremarkable. Musculoskeletal/Soft Tissues: There are no aggressive appearing lytic or blastic lesions noted in the visualized portions of the skeleton. IMPRESSION: 1. Sequela of prior necrotizing pneumonia in the right lower and middle lobes redemonstrated with multiple cavitary areas and extensive varicose and cystic bronchiectasis, similar to prior examinations. Compared to the most recent prior study, the extent of associated airspace consolidation has significantly improved. Right hilar and mediastinal lymphadenopathy, similar to prior examinations, likely reactive. 2. Mild centrilobular and paraseptal emphysema; imaging findings suggestive of underlying COPD. 3. Atherosclerosis, including left main and 2 vessel coronary artery disease. Please note that although the presence of coronary artery calcium documents the presence of coronary artery disease, the severity of this disease and any potential stenosis cannot be  assessed on this non-gated CT examination. Assessment for potential risk factor modification, dietary therapy or pharmacologic therapy may be warranted, if clinically indicated. 4. Additional incidental findings, as above. Electronically Signed   By: Vinnie Langton M.D.   On: 01/09/2016 15:43   I have personally reviewed and evaluated these images and lab results as part of my medical decision-making.   EKG Interpretation   Date/Time:  Wednesday January 09 2016 20:35:16 EST Ventricular Rate:  83 PR Interval:  174 QRS Duration: 87 QT Interval:  378 QTC Calculation: 444 R  Axis:   65 Text Interpretation:  Sinus rhythm Baseline wander in lead(s) I II aVR aVL  V2 No significant change was found Confirmed by Wyvonnia Dusky  MD, Parthiv Mucci  [id number]) on 01/09/2016 8:38:14 PM      MDM   Final diagnoses:  Acute respiratory failure with hypoxia (HCC)  CAP (community acquired pneumonia)   COPD and bronchiectasis with history of necrotizing pneumonia presenting with worsening shortness of breath and cough over the past several days. Outpatient CT today showed improvement in airspace disease.  Patient was given nebulizers. She received steroids. We'll treat for pneumonia given the appearance of her chest x-ray and CT scan.  Desaturate to mid 80s on ambulation. She does not wear oxygen at home. Will need admission for new oxygen requirement with recurrent pneumonia and COPD exacerbation. Discussed with Dr. Olevia Bowens.[name]  Ezequiel Essex, MD 01/10/16 [phone]

## 2016-01-10 DIAGNOSIS — I10 Essential (primary) hypertension: Secondary | ICD-10-CM

## 2016-01-10 DIAGNOSIS — F329 Major depressive disorder, single episode, unspecified: Secondary | ICD-10-CM

## 2016-01-10 LAB — OSMOLALITY: OSMOLALITY: 283 mosm/kg (ref 275–295)

## 2016-01-10 LAB — CBC WITH DIFFERENTIAL/PLATELET
BASOS ABS: 0 10*3/uL (ref 0.0–0.1)
Basophils Relative: 1 %
EOS ABS: 0 10*3/uL (ref 0.0–0.7)
EOS PCT: 0 %
HCT: 36.6 % (ref 36.0–46.0)
Hemoglobin: 12.1 g/dL (ref 12.0–15.0)
LYMPHS PCT: 21 %
Lymphs Abs: 0.9 10*3/uL (ref 0.7–4.0)
MCH: 28 pg (ref 26.0–34.0)
MCHC: 33.1 g/dL (ref 30.0–36.0)
MCV: 84.7 fL (ref 78.0–100.0)
Monocytes Absolute: 0 10*3/uL — ABNORMAL LOW (ref 0.1–1.0)
Monocytes Relative: 1 %
NEUTROS PCT: 77 %
Neutro Abs: 3.4 10*3/uL (ref 1.7–7.7)
PLATELETS: 224 10*3/uL (ref 150–400)
RBC: 4.32 MIL/uL (ref 3.87–5.11)
RDW: 14.8 % (ref 11.5–15.5)
WBC: 4.4 10*3/uL (ref 4.0–10.5)

## 2016-01-10 LAB — CBC
HCT: 37 % (ref 36.0–46.0)
Hemoglobin: 11.9 g/dL — ABNORMAL LOW (ref 12.0–15.0)
MCH: 27.4 pg (ref 26.0–34.0)
MCHC: 32.2 g/dL (ref 30.0–36.0)
MCV: 85.3 fL (ref 78.0–100.0)
PLATELETS: 257 10*3/uL (ref 150–400)
RBC: 4.34 MIL/uL (ref 3.87–5.11)
RDW: 14.7 % (ref 11.5–15.5)
WBC: 5.7 10*3/uL (ref 4.0–10.5)

## 2016-01-10 LAB — BASIC METABOLIC PANEL
ANION GAP: 11 (ref 5–15)
BUN: 6 mg/dL (ref 6–20)
CALCIUM: 8.6 mg/dL — AB (ref 8.9–10.3)
CO2: 30 mmol/L (ref 22–32)
Chloride: 91 mmol/L — ABNORMAL LOW (ref 101–111)
Creatinine, Ser: 0.42 mg/dL — ABNORMAL LOW (ref 0.44–1.00)
Glucose, Bld: 164 mg/dL — ABNORMAL HIGH (ref 65–99)
Potassium: 3.6 mmol/L (ref 3.5–5.1)
SODIUM: 132 mmol/L — AB (ref 135–145)

## 2016-01-10 LAB — CREATININE, SERUM
Creatinine, Ser: 0.56 mg/dL (ref 0.44–1.00)
GFR calc Af Amer: >60 mL/min (ref >60)
GFR calc non Af Amer: >60 mL/min (ref >60)

## 2016-01-10 LAB — SODIUM, URINE, RANDOM

## 2016-01-10 LAB — OSMOLALITY, URINE: OSMOLALITY UR: 329 mosm/kg (ref 300–900)

## 2016-01-10 LAB — STREP PNEUMONIAE URINARY ANTIGEN: STREP PNEUMO URINARY ANTIGEN: NEGATIVE

## 2016-01-10 MED ORDER — GUAIFENESIN ER 600 MG PO TB12: 1200.0000 mg | ORAL_TABLET | Freq: Two times a day (BID) | ORAL | Status: DC

## 2016-01-10 MED ORDER — NORTRIPTYLINE HCL 25 MG PO CAPS
50.0000 mg | ORAL_CAPSULE | Freq: Every day | ORAL | Status: DC
  Administered 2016-01-10 – 2016-01-14 (×6): 50 mg via ORAL
  Filled 2016-01-10 (×8): qty 2

## 2016-01-10 MED ORDER — DEXTROSE 5 % IV SOLN
1.0000 g | Freq: Three times a day (TID) | INTRAVENOUS | Status: DC
Start: 2016-01-10 — End: 2016-01-14
  Administered 2016-01-10 – 2016-01-14 (×14): 1 g via INTRAVENOUS
  Filled 2016-01-10 (×16): qty 1

## 2016-01-10 MED ORDER — METHYLPREDNISOLONE SODIUM SUCC 125 MG IJ SOLR
125.0000 mg | Freq: Once | INTRAMUSCULAR | Status: AC
  Administered 2016-01-10: 125 mg via INTRAVENOUS
  Filled 2016-01-10: qty 2

## 2016-01-10 MED ORDER — MAGNESIUM SULFATE 2 GM/50ML IV SOLN
2.0000 g | Freq: Once | INTRAVENOUS | Status: AC
  Administered 2016-01-10: 2 g via INTRAVENOUS
  Filled 2016-01-10: qty 50

## 2016-01-10 MED ORDER — ENOXAPARIN SODIUM 40 MG/0.4ML ~~LOC~~ SOLN
40.0000 mg | SUBCUTANEOUS | Status: DC
  Administered 2016-01-10 – 2016-01-14 (×5): 40 mg via SUBCUTANEOUS
  Filled 2016-01-10 (×5): qty 0.4

## 2016-01-10 MED ORDER — IPRATROPIUM-ALBUTEROL 0.5-2.5 (3) MG/3ML IN SOLN
3.0000 mL | Freq: Three times a day (TID) | RESPIRATORY_TRACT | Status: DC
  Administered 2016-01-11 – 2016-01-15 (×13): 3 mL via RESPIRATORY_TRACT
  Filled 2016-01-10 (×14): qty 3

## 2016-01-10 MED ORDER — NADOLOL 20 MG PO TABS
20.0000 mg | ORAL_TABLET | Freq: Every day | ORAL | Status: DC
  Administered 2016-01-10 – 2016-01-15 (×6): 20 mg via ORAL
  Filled 2016-01-10 (×6): qty 1

## 2016-01-10 MED ORDER — IPRATROPIUM BROMIDE 0.06 % NA SOLN
2.0000 | Freq: Two times a day (BID) | NASAL | Status: DC
  Administered 2016-01-10 – 2016-01-14 (×6): 2 via NASAL
  Filled 2016-01-10: qty 15

## 2016-01-10 MED ORDER — ONDANSETRON HCL 4 MG PO TABS: 4.0000 mg | ORAL_TABLET | Freq: Four times a day (QID) | ORAL | Status: DC | PRN

## 2016-01-10 MED ORDER — ONDANSETRON HCL 4 MG/2ML IJ SOLN: 4.0000 mg | Freq: Four times a day (QID) | INTRAMUSCULAR | Status: DC | PRN

## 2016-01-10 MED ORDER — ALBUTEROL SULFATE (2.5 MG/3ML) 0.083% IN NEBU
2.5000 mg | INHALATION_SOLUTION | RESPIRATORY_TRACT | Status: DC | PRN
  Administered 2016-01-14: 2.5 mg via RESPIRATORY_TRACT
  Filled 2016-01-10: qty 3

## 2016-01-10 MED ORDER — DIVALPROEX SODIUM ER 500 MG PO TB24
500.0000 mg | ORAL_TABLET | Freq: Every day | ORAL | Status: DC
  Administered 2016-01-10 – 2016-01-14 (×6): 500 mg via ORAL
  Filled 2016-01-10 (×7): qty 1

## 2016-01-10 MED ORDER — METHYLPREDNISOLONE SODIUM SUCC 125 MG IJ SOLR
60.0000 mg | Freq: Two times a day (BID) | INTRAMUSCULAR | Status: DC
  Administered 2016-01-10 (×2): 60 mg via INTRAVENOUS
  Filled 2016-01-10 (×2): qty 2

## 2016-01-10 MED ORDER — GUAIFENESIN ER 600 MG PO TB12
1200.0000 mg | ORAL_TABLET | Freq: Two times a day (BID) | ORAL | Status: DC
  Administered 2016-01-10 – 2016-01-15 (×11): 1200 mg via ORAL
  Filled 2016-01-10 (×11): qty 2

## 2016-01-10 MED ORDER — GUAIFENESIN ER 600 MG PO TB12
600.0000 mg | ORAL_TABLET | Freq: Two times a day (BID) | ORAL | Status: DC
  Administered 2016-01-10: 600 mg via ORAL
  Filled 2016-01-10 (×2): qty 1

## 2016-01-10 MED ORDER — SODIUM CHLORIDE 0.9% FLUSH
3.0000 mL | Freq: Two times a day (BID) | INTRAVENOUS | Status: DC
  Administered 2016-01-10 – 2016-01-15 (×10): 3 mL via INTRAVENOUS

## 2016-01-10 MED ORDER — BENZONATATE 100 MG PO CAPS
100.0000 mg | ORAL_CAPSULE | Freq: Three times a day (TID) | ORAL | Status: DC | PRN
  Administered 2016-01-10 – 2016-01-15 (×5): 100 mg via ORAL
  Filled 2016-01-10 (×5): qty 1

## 2016-01-10 MED ORDER — HYDROCODONE-ACETAMINOPHEN 5-325 MG PO TABS
0.5000 | ORAL_TABLET | Freq: Four times a day (QID) | ORAL | Status: DC | PRN
  Administered 2016-01-10 – 2016-01-14 (×7): 1 via ORAL
  Filled 2016-01-10 (×8): qty 1

## 2016-01-10 MED ORDER — IPRATROPIUM-ALBUTEROL 0.5-2.5 (3) MG/3ML IN SOLN
3.0000 mL | Freq: Four times a day (QID) | RESPIRATORY_TRACT | Status: DC
  Administered 2016-01-10 (×4): 3 mL via RESPIRATORY_TRACT
  Filled 2016-01-10 (×4): qty 3

## 2016-01-10 MED ORDER — POTASSIUM CHLORIDE IN NACL 40-0.9 MEQ/L-% IV SOLN
INTRAVENOUS | Status: DC
  Administered 2016-01-10: 50 mL/h via INTRAVENOUS
  Filled 2016-01-10: qty 1000

## 2016-01-10 MED ORDER — TIZANIDINE HCL 4 MG PO TABS: 4.0000 mg | ORAL_TABLET | Freq: Three times a day (TID) | ORAL | Status: DC | PRN

## 2016-01-10 MED ORDER — SENNOSIDES-DOCUSATE SODIUM 8.6-50 MG PO TABS
1.0000 | ORAL_TABLET | Freq: Every evening | ORAL | Status: DC | PRN
  Administered 2016-01-10: 1 via ORAL
  Filled 2016-01-10: qty 1

## 2016-01-10 MED ORDER — ALPRAZOLAM 0.5 MG PO TABS
0.5000 mg | ORAL_TABLET | Freq: Every day | ORAL | Status: DC
  Administered 2016-01-10 – 2016-01-14 (×6): 0.5 mg via ORAL
  Filled 2016-01-10 (×6): qty 1

## 2016-01-10 NOTE — Progress Notes (Signed)
[facility]                                                                              Patient Demographics  Catherine Kelley, is a 65 y.o. female, DOB - 27-Aug-1951, FGH:[id number]  Admit date - 01/09/2016   Admitting Physician Reubin Milan, MD  Outpatient Primary MD for the patient is SMITH,KRISTI, MD  LOS - 1   Chief Complaint  Patient presents with  . Shortness of Breath       Brief HPI   Catherine Kelley is a 65 y.o. female with a past medical history of hypertension, COPD, anxiety, depression, tobacco abuse disorder, prior necrotizing pneumonia presented from the urgent care center due to fever, progressively worse dyspnea, productive cough and wheezing after treatment there with bronchodilators and corticosteroids did not relieve her hypoxia, so she was sent to the emergency department.  ED workup showed ABG with mild CO2 retention, mild hypoxia, hyponatremia 131, hypokalemia 3.4, no leukocytosis. Chest x-ray showed lower lobe and right middle lobe airspace disease without significant interval change compared with one 1/11/7. CT chest showed prior necrotizing pneumonia and right lower and middle lobes with multiple cavitary areas and bronchiectasis, mild centrilobular and paraseptal emphysema with underlying COPD.  Assessment & Plan    Principal Problem:   HCAP (healthcare-associated pneumonia)/COPD exacerbation/acute hypoxic respiratory failure with underlying history of bronchiectasis (follows Dr. Chase Caller) - Continue scheduled DuoNeb nebs, IV Solu-Medrol, IV vancomycin and aztreonam  - Placed on flutter valve, Mucinex  - Follow sputum culture, obtain urine legionella antigen, urine strep antigen  - Home O2 evaluation prior to discharge - Follow 2-D echocardiogram, possibility of cor pulmonale from long-standing bronchiectasis  Active Problems:   Depression   Hyponatremia: Possibly due to SIADH from bronchiectasis - Obtain serum osm, urine osm, UNa -  Currently on gentle hydration, sodium improving to 132 this morning      Essential hypertension - Currently stable  Anxiety/depression - Continue Xanax - Continue Depakote, nortriptyline  Mild hypokalemia - Replaced  Tobacco disorder Counseled on the nicotine cessation, continue nicotine patch   Code Status: Full code Family Communication: Discussed in detail with the patient, all imaging results, lab results explained to the patient    Disposition Plan: Will likely need IV antibiotics for another 2-3 days, hopefully DC on weekend  Time Spent in minutes  25 minutes  Procedures  CT chest  Consults   none  DVT Prophylaxis  Lovenox   Medications  Scheduled Meds: . ALPRAZolam  0.5 mg Oral QHS  . aztreonam  1 g Intravenous 3 times per day  . divalproex  500 mg Oral QHS  . enoxaparin (LOVENOX) injection  40 mg Subcutaneous Q24H  . guaiFENesin  600 mg Oral BID  . ipratropium  2 spray Each Nare BID  . ipratropium-albuterol  3 mL Nebulization Q6H  . nadolol  20 mg Oral Daily  . nortriptyline  50 mg Oral QHS  . sodium chloride flush  3 mL Intravenous Q12H  . vancomycin  500 mg Intravenous Q12H   Continuous Infusions: . 0.9 % NaCl with KCl 40 mEq / L 50 mL/hr (01/10/16  0326)   PRN Meds:.albuterol, benzonatate, HYDROcodone-acetaminophen, ondansetron **OR** ondansetron (ZOFRAN) IV, tiZANidine   Antibiotics   Anti-infectives    Start     Dose/Rate Route Frequency Ordered Stop   01/10/16 1000  vancomycin (VANCOCIN) 500 mg in sodium chloride 0.9 % 100 mL IVPB     500 mg 100 mL/hr over 60 Minutes Intravenous Every 12 hours 01/09/16 2218 01/18/16 0959   01/10/16 0600  aztreonam (AZACTAM) 1 g in dextrose 5 % 50 mL IVPB     1 g 100 mL/hr over 30 Minutes Intravenous 3 times per day 01/10/16 0217     01/09/16 2100  vancomycin (VANCOCIN) IVPB 1000 mg/200 mL premix     1,000 mg 200 mL/hr over 60 Minutes Intravenous  Once 01/09/16 2046 01/09/16 2310   01/09/16 2045  aztreonam  (AZACTAM) 2 g in dextrose 5 % 50 mL IVPB     2 g 100 mL/hr over 30 Minutes Intravenous  Once 01/09/16 2041 01/09/16 2230        Subjective:   Catherine Kelley was seen and examined today.  Feels somewhat better today, no wheezing, shortness of breath is improving. Patient denies dizziness, chest pain, abdominal pain, N/V/D/C, new weakness, numbess, tingling. No acute events overnight.  Still coughing with productive phlegm.  Objective:   Blood pressure 108/53, pulse 81, temperature 97.5 F (36.4 C), temperature source Oral, resp. rate 18, height '5\' 3"'$  (1.6 m), weight 55.929 kg (123 lb 4.8 oz), SpO2 95 %.  Wt Readings from Last 3 Encounters:  01/10/16 55.929 kg (123 lb 4.8 oz)  01/09/16 58.877 kg (129 lb 12.8 oz)  12/19/15 59.875 kg (132 lb)     Intake/Output Summary (Last 24 hours) at 01/10/16 1002 Last data filed at 01/10/16 0941  Gross per 24 hour  Intake    240 ml  Output    250 ml  Net    -10 ml    Exam  General: Alert and oriented x 3, NAD  HEENT:  PERRLA, EOMI, Anicteric Sclera, mucous membranes moist.   Neck: Supple, no JVD, no masses  CVS: S1 S2 auscultated, no rubs, murmurs or gallops. Regular rate and rhythm.  Respiratory: coarse breath sounds bilaterally   Abdomen: Soft, nontender, nondistended, + bowel sounds  Ext: no cyanosis clubbing or edema  Neuro: AAOx3, Cr N's II- XII. Strength 5/5 upper and lower extremities bilaterally  Skin: No rashes  Psych: Normal affect and demeanor, alert and oriented x3    Data Review   Micro Results No results found for this or any previous visit (from the past 240 hour(s)).  Radiology Reports Dg Chest 2 View  01/09/2016  CLINICAL DATA:  Cough, shortness of breath, dizziness, wheezing EXAM: CHEST  2 VIEW COMPARISON:  CT chest 01/09/2016 FINDINGS: There is right lower lobe and right middle lobe airspace disease without significant interval change compared with 12/12/2015. There is no pleural effusion or pneumothorax. The  heart and mediastinal contours are unremarkable. The osseous structures are unremarkable. IMPRESSION: Lower lobe and right middle lobe airspace disease without significant interval change compared with 12/12/2015. Electronically Signed   By: Kathreen Devoid   On: 01/09/2016 20:22   Dg Chest 2 View  12/12/2015  CLINICAL DATA:  Cough and congestion.  History of pneumonia. EXAM: CHEST  2 VIEW COMPARISON:  CT scan 11/03/2015 FINDINGS: The cardiac silhouette, mediastinal and hilar contours are within normal limits and stable. There is mild tortuosity and calcification of the thoracic aorta. There are persistent right middle  lobe and right lower lobe infiltrates. Patient may require further evaluation with bronchoscopy to exclude a postobstructive process. IMPRESSION: Persistent right middle lobe and right lower lobe pneumonia as discussed above. Electronically Signed   By: Marijo Sanes M.D.   On: 12/12/2015 20:09   Ct Chest Wo Contrast  01/09/2016  CLINICAL DATA:  65 year old female with prior history of pneumonia (hospitalized in December 2016), with worsening cough, congestion, wheezing and shortness of breath. EXAM: CT CHEST WITHOUT CONTRAST TECHNIQUE: Multidetector CT imaging of the chest was performed following the standard protocol without IV contrast. COMPARISON:  Multiple priors, most recently chest CT 11/03/2015. FINDINGS: Mediastinum/Lymph Nodes: Heart size is normal. There is no significant pericardial fluid, thickening or pericardial calcification. There is atherosclerosis of the thoracic aorta, the great vessels of the mediastinum and the coronary arteries, including calcified atherosclerotic plaque in the distal left main, left anterior descending and left circumflex coronary arteries. Prominent soft tissue in the right hilar region likely reflects underlying lymphoid enlargement (which cannot be measured accurately on today's noncontrast CT examination). Enlarged subcarinal lymph node measuring 13 mm in  short axis. Enlarged low right paratracheal lymph node measuring up to 15 mm in short axis. High right paratracheal lymph node measuring up to 15 mm in short axis. Esophagus is unremarkable in appearance. No axillary lymphadenopathy. Lungs/Pleura: Compared to the prior examination from 11/03/2015 the airspace consolidation in the right lower lobe and to a lesser extent in the right middle lobe has largely resolved. There continues to be extensive areas in varicose and cystic bronchiectasis throughout the right lower and right middle lobes, where these bronchiectatic bronchi are largely filled with retained secretions. In the right lower lobe there also several cavitary areas, largest of which has thick walls and measures up to 3.3 x 2.2 cm (image 32 of series 3). Diffuse bronchial wall thickening. Patchy areas of more severe thickening of the peribronchovascular interstitium with some peribronchovascular ground-glass attenuation noted throughout the lungs bilaterally, most evident in the anterior aspects of the upper lobes of the lungs where there is also some very subtle peribronchovascular tree-in-bud micronodularity, which likely reflects areas of mucoid impaction within terminal bronchioles. Mild centrilobular and paraseptal emphysema. New ground-glass attenuation nodule in the medial aspect of the left upper lobe (image 7 of series 3) measuring 12 x 13 mm. No pleural effusions. Upper Abdomen: Unremarkable. Musculoskeletal/Soft Tissues: There are no aggressive appearing lytic or blastic lesions noted in the visualized portions of the skeleton. IMPRESSION: 1. Sequela of prior necrotizing pneumonia in the right lower and middle lobes redemonstrated with multiple cavitary areas and extensive varicose and cystic bronchiectasis, similar to prior examinations. Compared to the most recent prior study, the extent of associated airspace consolidation has significantly improved. Right hilar and mediastinal  lymphadenopathy, similar to prior examinations, likely reactive. 2. Mild centrilobular and paraseptal emphysema; imaging findings suggestive of underlying COPD. 3. Atherosclerosis, including left main and 2 vessel coronary artery disease. Please note that although the presence of coronary artery calcium documents the presence of coronary artery disease, the severity of this disease and any potential stenosis cannot be assessed on this non-gated CT examination. Assessment for potential risk factor modification, dietary therapy or pharmacologic therapy may be warranted, if clinically indicated. 4. Additional incidental findings, as above. Electronically Signed   By: Vinnie Langton M.D.   On: 01/09/2016 15:43    CBC  Recent Labs Lab 01/09/16 1710 01/09/16 2045 01/10/16 0210 01/10/16 0638  WBC 13.6* 9.8 5.7 4.4  HGB 13.9  12.6 11.9* 12.1  HCT 41.2 37.0 37.0 36.6  PLT  --  269 257 224  MCV 84.1 84.7 85.3 84.7  MCH 28.4 28.8 27.4 28.0  MCHC 33.8 34.1 32.2 33.1  RDW  --  14.7 14.7 14.8  LYMPHSABS  --  1.0  --  0.9  MONOABS  --  0.2  --  0.0*  EOSABS  --  0.0  --  0.0  BASOSABS  --  0.0  --  0.0    Chemistries   Recent Labs Lab 01/09/16 1706 01/09/16 2045 01/10/16 0210 01/10/16 0638  NA 124* 131*  --  132*  K 5.1 3.4*  --  3.6  CL 82* 88*  --  91*  CO2 32* 31  --  30  GLUCOSE 108* 114*  --  164*  BUN 6* <5*  --  6  CREATININE 0.52 0.47 0.56 0.42*  CALCIUM 8.9 8.9  --  8.6*  AST 42*  --   --   --   ALT 15  --   --   --   ALKPHOS 109  --   --   --   BILITOT 0.6  --   --   --    ------------------------------------------------------------------------------------------------------------------ estimated creatinine clearance is 58 mL/min (by C-G formula based on Cr of 0.42). ------------------------------------------------------------------------------------------------------------------ No results for input(s): HGBA1C in the last 72  hours. ------------------------------------------------------------------------------------------------------------------ No results for input(s): CHOL, HDL, LDLCALC, TRIG, CHOLHDL, LDLDIRECT in the last 72 hours. ------------------------------------------------------------------------------------------------------------------ No results for input(s): TSH, T4TOTAL, T3FREE, THYROIDAB in the last 72 hours.  Invalid input(s): FREET3 ------------------------------------------------------------------------------------------------------------------ No results for input(s): VITAMINB12, FOLATE, FERRITIN, TIBC, IRON, RETICCTPCT in the last 72 hours.  Coagulation profile No results for input(s): INR, PROTIME in the last 168 hours.  No results for input(s): DDIMER in the last 72 hours.  Cardiac Enzymes  Recent Labs Lab 01/09/16 2045  TROPONINI <0.03   ------------------------------------------------------------------------------------------------------------------ Invalid input(s): POCBNP  No results for input(s): GLUCAP in the last 72 hours.   Marenda Accardi M.D. [facility] 01/10/2016, 10:02 AM  Pager: [phone] Between 7am to 7pm - call Pager - [phone]  After 7pm go to [email or web address] - password [email or web address]  Call night coverage person covering after 7pm

## 2016-01-10 NOTE — Progress Notes (Signed)
Attempted report from ED RN

## 2016-01-10 NOTE — Progress Notes (Signed)
Utilization review completed. Niall Illes, RN, BSN. 

## 2016-01-10 NOTE — Progress Notes (Signed)
Pt admitted from ED with SOB, PNA, pt a/o, no c/o pain, pt on IVF NS @ 50 ml/hr, IV solumedrol given as ordered, abx ordered, bed alarm on safety maintained, pt has 2L O2 via nasal canula, VSS, pt stable

## 2016-01-11 ENCOUNTER — Inpatient Hospital Stay (HOSPITAL_COMMUNITY): Payer: Managed Care, Other (non HMO)

## 2016-01-11 DIAGNOSIS — R0600 Dyspnea, unspecified: Secondary | ICD-10-CM

## 2016-01-11 DIAGNOSIS — E871 Hypo-osmolality and hyponatremia: Secondary | ICD-10-CM

## 2016-01-11 LAB — CBC WITH DIFFERENTIAL/PLATELET
BASOS ABS: 0 10*3/uL (ref 0.0–0.1)
BASOS PCT: 0 %
EOS ABS: 0 10*3/uL (ref 0.0–0.7)
EOS PCT: 0 %
HCT: 35.4 % — ABNORMAL LOW (ref 36.0–46.0)
Hemoglobin: 11.3 g/dL — ABNORMAL LOW (ref 12.0–15.0)
Lymphocytes Relative: 15 %
Lymphs Abs: 1.1 10*3/uL (ref 0.7–4.0)
MCH: 27.9 pg (ref 26.0–34.0)
MCHC: 31.9 g/dL (ref 30.0–36.0)
MCV: 87.4 fL (ref 78.0–100.0)
MONO ABS: 0.3 10*3/uL (ref 0.1–1.0)
Monocytes Relative: 4 %
NEUTROS ABS: 6 10*3/uL (ref 1.7–7.7)
Neutrophils Relative %: 81 %
PLATELETS: 229 10*3/uL (ref 150–400)
RBC: 4.05 MIL/uL (ref 3.87–5.11)
RDW: 14.7 % (ref 11.5–15.5)
WBC: 7.5 10*3/uL (ref 4.0–10.5)

## 2016-01-11 LAB — BASIC METABOLIC PANEL
ANION GAP: 7 (ref 5–15)
BUN: 9 mg/dL (ref 6–20)
CALCIUM: 8.8 mg/dL — AB (ref 8.9–10.3)
CO2: 32 mmol/L (ref 22–32)
Chloride: 97 mmol/L — ABNORMAL LOW (ref 101–111)
Creatinine, Ser: 0.44 mg/dL (ref 0.44–1.00)
GLUCOSE: 136 mg/dL — AB (ref 65–99)
POTASSIUM: 3.9 mmol/L (ref 3.5–5.1)
SODIUM: 136 mmol/L (ref 135–145)

## 2016-01-11 LAB — LEGIONELLA ANTIGEN, URINE

## 2016-01-11 MED ORDER — SENNA 8.6 MG PO TABS
2.0000 | ORAL_TABLET | Freq: Every day | ORAL | Status: DC
  Administered 2016-01-11 – 2016-01-15 (×4): 17.2 mg via ORAL
  Filled 2016-01-11 (×6): qty 2

## 2016-01-11 MED ORDER — PREDNISONE 20 MG PO TABS
40.0000 mg | ORAL_TABLET | Freq: Every day | ORAL | Status: DC
  Administered 2016-01-11 – 2016-01-12 (×2): 40 mg via ORAL
  Filled 2016-01-11 (×2): qty 2

## 2016-01-11 MED ORDER — BISACODYL 10 MG RE SUPP
10.0000 mg | Freq: Once | RECTAL | Status: AC
  Administered 2016-01-11: 10 mg via RECTAL
  Filled 2016-01-11: qty 1

## 2016-01-11 MED ORDER — POLYETHYLENE GLYCOL 3350 17 G PO PACK
17.0000 g | PACK | Freq: Two times a day (BID) | ORAL | Status: DC
  Administered 2016-01-12 – 2016-01-15 (×7): 17 g via ORAL
  Filled 2016-01-11 (×9): qty 1

## 2016-01-11 NOTE — Progress Notes (Signed)
  Echocardiogram 2D Echocardiogram has been performed.  Tresa Res 01/11/2016, 10:29 AM

## 2016-01-11 NOTE — Progress Notes (Addendum)
[facility]                                                                              Patient Demographics  Catherine Kelley, is a 65 y.o. female, DOB - 05-16-1951, OFB:[id number]  Admit date - 01/09/2016   Admitting Physician Reubin Milan, MD  Outpatient Primary MD for the patient is [name], MD  LOS - 2   Chief Complaint  Patient presents with  . Shortness of Breath       Brief HPI   Catherine Kelley is a 65 y.o. female with a past medical history of hypertension, COPD, anxiety, depression, tobacco abuse disorder, prior necrotizing pneumonia presented from the urgent care center due to fever, progressively worse dyspnea, productive cough and wheezing after treatment there with bronchodilators and corticosteroids did not relieve her hypoxia, so she was sent to the emergency department.  ED workup showed ABG with mild CO2 retention, mild hypoxia, hyponatremia 131, hypokalemia 3.4, no leukocytosis. Chest x-ray showed lower lobe and right middle lobe airspace disease without significant interval change compared with one 1/11/7. CT chest showed prior necrotizing pneumonia and right lower and middle lobes with multiple cavitary areas and bronchiectasis, mild centrilobular and paraseptal emphysema with underlying COPD.  Assessment & Plan    Principal Problem:   HCAP (healthcare-associated pneumonia)/COPD exacerbation/acute hypoxic respiratory failure with underlying history of bronchiectasis (followed by Dr. Chase Caller, Pulmonology) - Treated with scheduled DuoNeb nebs, IV Solu-Medrol, IV vancomycin and aztreonam (on IV antibiotics since 2/8). - Placed on flutter valve, Mucinex  - Sputum culture unfortunately was not sent. Urine streptococcal antigen: Negative. Urine Legionella antigen: Negative.  - Home O2 evaluation prior to discharge - 2-D echo results as below: Unremarkable and does not suggest cor pulmonale. - Blood cultures 2: Negative to date. DC vancomycin 2/10.  Sputum culture from 11/02/15 had shown Pseudomonas. Consider changing to oral levofloxacin at discharge. - Changed steroids to oral prednisone. Added chest PT. - Will need close outpatient follow-up with pulmonology upon discharge.  Active Problems:    Hyponatremia: Patient presented with sodium of 124. Unclear etiology but may have been due to dehydration. Serum and urine osmolarity normal. Hyponatremia resolved. DC IV fluids.      Essential hypertension - Currently stable/controlled.  Anxiety/depression - Continue Xanax - Continue Depakote, nortriptyline  Mild hypokalemia - Replaced  Tobacco disorder Counseled on the nicotine cessation, continue nicotine patch  Anemia - Periodically follow CBCs as outpatient.   Code Status: Full code Family Communication: None at bedside. Disposition Plan: Continue IV antibiotics for additional 24 hours and consider DC on 01/12/16. Getting PT evaluation.  Time Spent in minutes  25 minutes  Procedures  CT chest  Consults   none  DVT Prophylaxis  Lovenox   Medications  Scheduled Meds: . ALPRAZolam  0.5 mg Oral QHS  . aztreonam  1 g Intravenous 3 times per day  . divalproex  500 mg Oral QHS  . enoxaparin (LOVENOX) injection  40 mg Subcutaneous Q24H  . guaiFENesin  1,200 mg Oral BID  . ipratropium  2 spray Each Nare BID  . ipratropium-albuterol  3 mL Nebulization TID  .  nadolol  20 mg Oral Daily  . nortriptyline  50 mg Oral QHS  . polyethylene glycol  17 g Oral BID  . predniSONE  40 mg Oral Q breakfast  . senna  2 tablet Oral Daily  . sodium chloride flush  3 mL Intravenous Q12H  . vancomycin  500 mg Intravenous Q12H   Continuous Infusions:   PRN Meds:.albuterol, benzonatate, HYDROcodone-acetaminophen, ondansetron **OR** ondansetron (ZOFRAN) IV, senna-docusate, tiZANidine   Antibiotics   Anti-infectives    Start     Dose/Rate Route Frequency Ordered Stop   01/10/16 1000  vancomycin (VANCOCIN) 500 mg in sodium chloride 0.9  % 100 mL IVPB     500 mg 100 mL/hr over 60 Minutes Intravenous Every 12 hours 01/09/16 2218 01/18/16 0959   01/10/16 0600  aztreonam (AZACTAM) 1 g in dextrose 5 % 50 mL IVPB     1 g 100 mL/hr over 30 Minutes Intravenous 3 times per day 01/10/16 0217     01/09/16 2100  vancomycin (VANCOCIN) IVPB 1000 mg/200 mL premix     1,000 mg 200 mL/hr over 60 Minutes Intravenous  Once 01/09/16 2046 01/09/16 2310   01/09/16 2045  aztreonam (AZACTAM) 2 g in dextrose 5 % 50 mL IVPB     2 g 100 mL/hr over 30 Minutes Intravenous  Once 01/09/16 2041 01/09/16 2230        Subjective:   Cough improving. Still productive of green/yellow sputum but no blood. Dyspnea only on taking deep breaths when she starts coughing. Constipation. Flatus +. As per RN, no acute events.  Objective:   Filed Vitals:   01/11/16 0618 01/11/16 1023 01/11/16 1056 01/11/16 1139  BP: 115/57 119/55 112/55 123/52  Pulse: 91 91 91 93  Temp: 98 F (36.7 C) 97.6 F (36.4 C)  97.6 F (36.4 C)  TempSrc: Oral Oral  Oral  Resp: '18 16  18  '$ Height:      Weight: 56.926 kg (125 lb 8 oz)     SpO2: 92% 93%  96%     Wt Readings from Last 3 Encounters:  01/11/16 56.926 kg (125 lb 8 oz)  01/09/16 58.877 kg (129 lb 12.8 oz)  12/19/15 59.875 kg (132 lb)     Intake/Output Summary (Last 24 hours) at 01/11/16 1401 Last data filed at 01/11/16 1120  Gross per 24 hour  Intake   1860 ml  Output    700 ml  Net   1160 ml    Exam  General: Pleasant middle-aged female lying comfortably propped up in bed.  CVS: S1 S2 auscultated, no rubs, murmurs or gallops. Regular rate and rhythm. Telemetry: Sinus rhythm.  Respiratory: coarse breath sounds bilaterally. Scattered basal coarse Velcro-like chronic sounding crackles. No wheezing or rhonchi. No increased work of breathing.  Abdomen: Soft, nontender, nondistended, + bowel sounds  Ext: no cyanosis clubbing or edema  Neuro: AAOx3, Cr N's II- XII. Strength 5/5 upper and lower extremities  bilaterally. Alert and oriented  Psych: Normal affect and demeanor, alert and oriented x3    Data Review   Micro Results Recent Results (from the past 240 hour(s))  Blood culture (routine x 2)     Status: None (Preliminary result)   Collection Time: 01/09/16  9:10 PM  Result Value Ref Range Status   Specimen Description BLOOD RIGHT ARM  Final   Special Requests BOTTLES DRAWN AEROBIC AND ANAEROBIC 5CC  Final   Culture NO GROWTH 2 DAYS  Final   Report Status PENDING  Incomplete  Blood culture (routine x 2)     Status: None (Preliminary result)   Collection Time: 01/09/16  9:15 PM  Result Value Ref Range Status   Specimen Description BLOOD LEFT HAND  Final   Special Requests BOTTLES DRAWN AEROBIC AND ANAEROBIC 5CC  Final   Culture NO GROWTH 2 DAYS  Final   Report Status PENDING  Incomplete    Radiology Reports Dg Chest 2 View  01/09/2016  CLINICAL DATA:  Cough, shortness of breath, dizziness, wheezing EXAM: CHEST  2 VIEW COMPARISON:  CT chest 01/09/2016 FINDINGS: There is right lower lobe and right middle lobe airspace disease without significant interval change compared with 12/12/2015. There is no pleural effusion or pneumothorax. The heart and mediastinal contours are unremarkable. The osseous structures are unremarkable. IMPRESSION: Lower lobe and right middle lobe airspace disease without significant interval change compared with 12/12/2015. Electronically Signed   By: Kathreen Devoid   On: 01/09/2016 20:22   Dg Chest 2 View  12/12/2015  CLINICAL DATA:  Cough and congestion.  History of pneumonia. EXAM: CHEST  2 VIEW COMPARISON:  CT scan 11/03/2015 FINDINGS: The cardiac silhouette, mediastinal and hilar contours are within normal limits and stable. There is mild tortuosity and calcification of the thoracic aorta. There are persistent right middle lobe and right lower lobe infiltrates. Patient may require further evaluation with bronchoscopy to exclude a postobstructive process.  IMPRESSION: Persistent right middle lobe and right lower lobe pneumonia as discussed above. Electronically Signed   By: Marijo Sanes M.D.   On: 12/12/2015 20:09   Ct Chest Wo Contrast  01/09/2016  CLINICAL DATA:  65 year old female with prior history of pneumonia (hospitalized in December 2016), with worsening cough, congestion, wheezing and shortness of breath. EXAM: CT CHEST WITHOUT CONTRAST TECHNIQUE: Multidetector CT imaging of the chest was performed following the standard protocol without IV contrast. COMPARISON:  Multiple priors, most recently chest CT 11/03/2015. FINDINGS: Mediastinum/Lymph Nodes: Heart size is normal. There is no significant pericardial fluid, thickening or pericardial calcification. There is atherosclerosis of the thoracic aorta, the great vessels of the mediastinum and the coronary arteries, including calcified atherosclerotic plaque in the distal left main, left anterior descending and left circumflex coronary arteries. Prominent soft tissue in the right hilar region likely reflects underlying lymphoid enlargement (which cannot be measured accurately on today's noncontrast CT examination). Enlarged subcarinal lymph node measuring 13 mm in short axis. Enlarged low right paratracheal lymph node measuring up to 15 mm in short axis. High right paratracheal lymph node measuring up to 15 mm in short axis. Esophagus is unremarkable in appearance. No axillary lymphadenopathy. Lungs/Pleura: Compared to the prior examination from 11/03/2015 the airspace consolidation in the right lower lobe and to a lesser extent in the right middle lobe has largely resolved. There continues to be extensive areas in varicose and cystic bronchiectasis throughout the right lower and right middle lobes, where these bronchiectatic bronchi are largely filled with retained secretions. In the right lower lobe there also several cavitary areas, largest of which has thick walls and measures up to 3.3 x 2.2 cm (image 32  of series 3). Diffuse bronchial wall thickening. Patchy areas of more severe thickening of the peribronchovascular interstitium with some peribronchovascular ground-glass attenuation noted throughout the lungs bilaterally, most evident in the anterior aspects of the upper lobes of the lungs where there is also some very subtle peribronchovascular tree-in-bud micronodularity, which likely reflects areas of mucoid impaction within terminal bronchioles. Mild centrilobular and paraseptal emphysema. New ground-glass attenuation  nodule in the medial aspect of the left upper lobe (image 7 of series 3) measuring 12 x 13 mm. No pleural effusions. Upper Abdomen: Unremarkable. Musculoskeletal/Soft Tissues: There are no aggressive appearing lytic or blastic lesions noted in the visualized portions of the skeleton. IMPRESSION: 1. Sequela of prior necrotizing pneumonia in the right lower and middle lobes redemonstrated with multiple cavitary areas and extensive varicose and cystic bronchiectasis, similar to prior examinations. Compared to the most recent prior study, the extent of associated airspace consolidation has significantly improved. Right hilar and mediastinal lymphadenopathy, similar to prior examinations, likely reactive. 2. Mild centrilobular and paraseptal emphysema; imaging findings suggestive of underlying COPD. 3. Atherosclerosis, including left main and 2 vessel coronary artery disease. Please note that although the presence of coronary artery calcium documents the presence of coronary artery disease, the severity of this disease and any potential stenosis cannot be assessed on this non-gated CT examination. Assessment for potential risk factor modification, dietary therapy or pharmacologic therapy may be warranted, if clinically indicated. 4. Additional incidental findings, as above. Electronically Signed   By: Vinnie Langton M.D.   On: 01/09/2016 15:43    CBC  Recent Labs Lab 01/09/16 1710  01/09/16 2045 01/10/16 0210 01/10/16 0638 01/11/16 0600  WBC 13.6* 9.8 5.7 4.4 7.5  HGB 13.9 12.6 11.9* 12.1 11.3*  HCT 41.2 37.0 37.0 36.6 35.4*  PLT  --  269 257 224 229  MCV 84.1 84.7 85.3 84.7 87.4  MCH 28.4 28.8 27.4 28.0 27.9  MCHC 33.8 34.1 32.2 33.1 31.9  RDW  --  14.7 14.7 14.8 14.7  LYMPHSABS  --  1.0  --  0.9 1.1  MONOABS  --  0.2  --  0.0* 0.3  EOSABS  --  0.0  --  0.0 0.0  BASOSABS  --  0.0  --  0.0 0.0    Chemistries   Recent Labs Lab 01/09/16 1706 01/09/16 2045 01/10/16 0210 01/10/16 0638 01/11/16 0600  NA 124* 131*  --  132* 136  K 5.1 3.4*  --  3.6 3.9  CL 82* 88*  --  91* 97*  CO2 32* 31  --  30 32  GLUCOSE 108* 114*  --  164* 136*  BUN 6* <5*  --  6 9  CREATININE 0.52 0.47 0.56 0.42* 0.44  CALCIUM 8.9 8.9  --  8.6* 8.8*  AST 42*  --   --   --   --   ALT 15  --   --   --   --   ALKPHOS 109  --   --   --   --   BILITOT 0.6  --   --   --   --    ------------------------------------------------------------------------------------------------------------------ estimated creatinine clearance is 58 mL/min (by C-G formula based on Cr of 0.44). ------------------------------------------------------------------------------------------------------------------ No results for input(s): HGBA1C in the last 72 hours. ------------------------------------------------------------------------------------------------------------------ No results for input(s): CHOL, HDL, LDLCALC, TRIG, CHOLHDL, LDLDIRECT in the last 72 hours. ------------------------------------------------------------------------------------------------------------------ No results for input(s): TSH, T4TOTAL, T3FREE, THYROIDAB in the last 72 hours.  Invalid input(s): FREET3 ------------------------------------------------------------------------------------------------------------------ No results for input(s): VITAMINB12, FOLATE, FERRITIN, TIBC, IRON, RETICCTPCT in the last 72 hours.  Coagulation  profile No results for input(s): INR, PROTIME in the last 168 hours.  No results for input(s): DDIMER in the last 72 hours.  Cardiac Enzymes  Recent Labs Lab 01/09/16 2045  TROPONINI <0.03   ------------------------------------------------------------------------------------------------------------------ Invalid input(s): POCBNP  No results for input(s): GLUCAP in the last 72 hours.  Vernell Leep, MD, FACP, FHM. [facility] Pager [phone]  If 7PM-7AM, please contact night-coverage [email or web address] Password [email or web address] 01/11/2016, 2:01 PM

## 2016-01-11 NOTE — Evaluation (Signed)
Physical Therapy Evaluation Patient Details Name: FRIDA WAHLSTROM MRN: [id number] DOB: [birth year] Today's Date: 01/11/2016   History of Present Illness  65 y.o. female with a past medical history of hypertension, COPD, anxiety, depression, tobacco abuse disorder, prior necrotizing pneumonia presents with HCAP (healthcare-associated pneumonia)/COPD exacerbation/acute hypoxic respiratory failure with underlying history of bronchiectasis   Clinical Impression  Patient independent with mobility but does demonstrate fatigue and some SOB on room air with increased activity. Educated on energy conservation. O2 saturations >90% despite DOE. No further acute PT needs. Will sign off.    Follow Up Recommendations No PT follow up    Equipment Recommendations  None recommended by PT    Recommendations for Other Services       Precautions / Restrictions Precautions Precautions:  (SOB, watch O2)      Mobility  Bed Mobility Overal bed mobility: Independent                Transfers Overall transfer level: Independent                  Ambulation/Gait Ambulation/Gait assistance: Independent Ambulation Distance (Feet): 310 Feet Assistive device: None Gait Pattern/deviations: WFL(Within Functional Limits) Gait velocity: decreased   General Gait Details: steady with gait but does fatigue with increased distance  Stairs            Wheelchair Mobility    Modified Rankin (Stroke Patients Only)       Balance Overall balance assessment: No apparent balance deficits (not formally assessed) (able to perform higer level balance tasks without difficulty)                                           Pertinent Vitals/Pain Pain Assessment: No/denies pain    Home Living Family/patient expects to be discharged to:: Private residence Living Arrangements: Spouse/significant other Available Help at Discharge: Family;Available PRN/intermittently Type of Home:  House Home Access: Stairs to enter Entrance Stairs-Rails: Psychiatric nurse of Steps: 5 Home Layout: One level Home Equipment: Bedside commode      Prior Function Level of Independence: Independent               Hand Dominance   Dominant Hand: Right    Extremity/Trunk Assessment   Upper Extremity Assessment: Overall WFL for tasks assessed           Lower Extremity Assessment: Overall WFL for tasks assessed         Communication   Communication: No difficulties  Cognition Arousal/Alertness: Awake/alert Behavior During Therapy: WFL for tasks assessed/performed Overall Cognitive Status: Within Functional Limits for tasks assessed                      General Comments      Exercises        Assessment/Plan    PT Assessment Patent does not need any further PT services  PT Diagnosis Difficulty walking   PT Problem List    PT Treatment Interventions     PT Goals (Current goals can be found in the Care Plan section) Acute Rehab PT Goals PT Goal Formulation: All assessment and education complete, DC therapy    Frequency     Barriers to discharge        Co-evaluation               End of Session Equipment Utilized  During Treatment: Gait belt Activity Tolerance: Patient tolerated treatment well;Patient limited by fatigue Patient left: in bed;with call bell/phone within reach Nurse Communication: Mobility status         Time: 8101-7510 PT Time Calculation (min) (ACUTE ONLY): 14 min   Charges:   PT Evaluation $PT Eval Moderate Complexity: 1 Procedure     PT G CodesDuncan Dull Feb 02, 2016, 4:26 PM Alben Deeds, [place] DPT  [phone]

## 2016-01-12 LAB — CBC WITH DIFFERENTIAL/PLATELET
BASOS ABS: 0 10*3/uL (ref 0.0–0.1)
BASOS PCT: 0 %
EOS ABS: 0 10*3/uL (ref 0.0–0.7)
Eosinophils Relative: 0 %
HEMATOCRIT: 36 % (ref 36.0–46.0)
HEMOGLOBIN: 11.3 g/dL — AB (ref 12.0–15.0)
Lymphocytes Relative: 35 %
Lymphs Abs: 2.6 10*3/uL (ref 0.7–4.0)
MCH: 27.6 pg (ref 26.0–34.0)
MCHC: 31.4 g/dL (ref 30.0–36.0)
MCV: 88 fL (ref 78.0–100.0)
MONOS PCT: 9 %
Monocytes Absolute: 0.7 10*3/uL (ref 0.1–1.0)
NEUTROS ABS: 4 10*3/uL (ref 1.7–7.7)
NEUTROS PCT: 56 %
Platelets: 242 10*3/uL (ref 150–400)
RBC: 4.09 MIL/uL (ref 3.87–5.11)
RDW: 14.9 % (ref 11.5–15.5)
WBC: 7.2 10*3/uL (ref 4.0–10.5)

## 2016-01-12 MED ORDER — PREDNISONE 20 MG PO TABS
40.0000 mg | ORAL_TABLET | Freq: Every day | ORAL | Status: DC
  Administered 2016-01-13 – 2016-01-14 (×2): 40 mg via ORAL
  Filled 2016-01-12 (×3): qty 2

## 2016-01-12 NOTE — Progress Notes (Signed)
[facility]                                                                              Patient Demographics  Catherine Kelley, is a 65 y.o. female, DOB - 08-15-1951, BSJ:[id number]  Admit date - 01/09/2016   Admitting Physician Reubin Milan, MD  Outpatient Primary MD for the patient is [name], MD  LOS - 3   Chief Complaint  Patient presents with  . Shortness of Breath       Brief HPI   Catherine Kelley is a 65 y.o. female with a past medical history of hypertension, COPD, anxiety, depression, tobacco abuse disorder, prior necrotizing pneumonia presented from the urgent care center due to fever, progressively worse dyspnea, productive cough and wheezing after treatment there with bronchodilators and corticosteroids did not relieve her hypoxia, so she was sent to the emergency department.  ED workup showed ABG with mild CO2 retention, mild hypoxia, hyponatremia 131, hypokalemia 3.4, no leukocytosis. Chest x-ray showed lower lobe and right middle lobe airspace disease without significant interval change compared with one 1/11/7. CT chest showed prior necrotizing pneumonia and right lower and middle lobes with multiple cavitary areas and bronchiectasis, mild centrilobular and paraseptal emphysema with underlying COPD.  Assessment & Plan    Principal Problem:   HCAP (healthcare-associated pneumonia)/COPD exacerbation/acute hypoxic respiratory failure with underlying history of bronchiectasis (follows Dr. Chase Caller) - Continue scheduled DuoNeb nebs, IV Solu-Medrol, IV vancomycin and aztreonam today - Urine legionella antigen negative, urine strep antigen negative, no sputum culture , blood cultures negative to date - Placed on flutter valve, Mucinex  - Home O2 evaluation prior to discharge - 2-D echo showed no cor pulmonale, EF 76-54%, grade 1 diastolic dysfunction  Active Problems:    Hyponatremia: Possibly due to SIADH from bronchiectasis, resolved - Sodium 136  -  Urine osmolarity 329, urine sodium less than 10, serum osm 283      Essential hypertension - Currently stable  Anxiety/depression - Continue Xanax - Continue Depakote, nortriptyline  Mild hypokalemia - Replaced  Tobacco disorder Counseled on the nicotine cessation, continue nicotine patch   Code Status: Full code Family Communication: Discussed in detail with the patient, all imaging results, lab results explained to the patient    Disposition Plan: Continue IV antibiotics today, transitioned to oral antibiotics tomorrow, likely DC in a.m. Plan explained to the patient. PT evaluation recommended home health  Time Spent in minutes  25 minutes  Procedures  CT chest  Consults   none  DVT Prophylaxis  Lovenox   Medications  Scheduled Meds: . ALPRAZolam  0.5 mg Oral QHS  . aztreonam  1 g Intravenous 3 times per day  . divalproex  500 mg Oral QHS  . enoxaparin (LOVENOX) injection  40 mg Subcutaneous Q24H  . guaiFENesin  1,200 mg Oral BID  . ipratropium  2 spray Each Nare BID  . ipratropium-albuterol  3 mL Nebulization TID  . nadolol  20 mg Oral Daily  . nortriptyline  50 mg Oral QHS  . polyethylene glycol  17 g Oral BID  . predniSONE  40 mg Oral Q breakfast  .  senna  2 tablet Oral Daily  . sodium chloride flush  3 mL Intravenous Q12H   Continuous Infusions:   PRN Meds:.albuterol, benzonatate, HYDROcodone-acetaminophen, ondansetron **OR** [DISCONTINUED] ondansetron (ZOFRAN) IV, senna-docusate, tiZANidine   Antibiotics   Anti-infectives    Start     Dose/Rate Route Frequency Ordered Stop   01/10/16 1000  vancomycin (VANCOCIN) 500 mg in sodium chloride 0.9 % 100 mL IVPB  Status:  Discontinued     500 mg 100 mL/hr over 60 Minutes Intravenous Every 12 hours 01/09/16 2218 01/11/16 1418   01/10/16 0600  aztreonam (AZACTAM) 1 g in dextrose 5 % 50 mL IVPB     1 g 100 mL/hr over 30 Minutes Intravenous 3 times per day 01/10/16 0217     01/09/16 2100  vancomycin  (VANCOCIN) IVPB 1000 mg/200 mL premix     1,000 mg 200 mL/hr over 60 Minutes Intravenous  Once 01/09/16 2046 01/09/16 2310   01/09/16 2045  aztreonam (AZACTAM) 2 g in dextrose 5 % 50 mL IVPB     2 g 100 mL/hr over 30 Minutes Intravenous  Once 01/09/16 2041 01/09/16 2230        Subjective:   Catherine Kelley was seen and examined today. Coughing, with productive phlegm, complaining of headache. No fevers or chills. Shortness of breath improving. Patient denies dizziness, chest pain, abdominal pain, N/V/D/C, new weakness, numbess, tingling. No acute events overnight.    Objective:   Blood pressure 159/68, pulse 73, temperature 97.7 F (36.5 C), temperature source Oral, resp. rate 18, height '5\' 3"'$  (1.6 m), weight 57.788 kg (127 lb 6.4 oz), SpO2 97 %.  Wt Readings from Last 3 Encounters:  01/12/16 57.788 kg (127 lb 6.4 oz)  01/09/16 58.877 kg (129 lb 12.8 oz)  12/19/15 59.875 kg (132 lb)     Intake/Output Summary (Last 24 hours) at 01/12/16 1041 Last data filed at 01/12/16 0900  Gross per 24 hour  Intake   1205 ml  Output    302 ml  Net    903 ml    Exam  General: Alert and oriented x 3, NAD  HEENT:  PERRLA, EOMI, Anicteric Sclera, mucous membranes moist.   Neck: Supple, no JVD, no masses  CVS: S1 S2 auscultated, no rubs, murmurs or gallops. Regular rate and rhythm.  Respiratory: Bilateral rhonchi with wheezing  Abdomen: Soft, nontender, nondistended, + bowel sounds  Ext: no cyanosis clubbing or edema  Neuro: no new deficits  Skin: No rashes  Psych: Normal affect and demeanor, alert and oriented x3    Data Review   Micro Results Recent Results (from the past 240 hour(s))  Blood culture (routine x 2)     Status: None (Preliminary result)   Collection Time: 01/09/16  9:10 PM  Result Value Ref Range Status   Specimen Description BLOOD RIGHT ARM  Final   Special Requests BOTTLES DRAWN AEROBIC AND ANAEROBIC 5CC  Final   Culture NO GROWTH 3 DAYS  Final   Report  Status PENDING  Incomplete  Blood culture (routine x 2)     Status: None (Preliminary result)   Collection Time: 01/09/16  9:15 PM  Result Value Ref Range Status   Specimen Description BLOOD LEFT HAND  Final   Special Requests BOTTLES DRAWN AEROBIC AND ANAEROBIC 5CC  Final   Culture NO GROWTH 3 DAYS  Final   Report Status PENDING  Incomplete    Radiology Reports Dg Chest 2 View  01/09/2016  CLINICAL DATA:  Cough, shortness of  breath, dizziness, wheezing EXAM: CHEST  2 VIEW COMPARISON:  CT chest 01/09/2016 FINDINGS: There is right lower lobe and right middle lobe airspace disease without significant interval change compared with 12/12/2015. There is no pleural effusion or pneumothorax. The heart and mediastinal contours are unremarkable. The osseous structures are unremarkable. IMPRESSION: Lower lobe and right middle lobe airspace disease without significant interval change compared with 12/12/2015. Electronically Signed   By: Kathreen Devoid   On: 01/09/2016 20:22   Ct Chest Wo Contrast  01/09/2016  CLINICAL DATA:  65 year old female with prior history of pneumonia (hospitalized in December 2016), with worsening cough, congestion, wheezing and shortness of breath. EXAM: CT CHEST WITHOUT CONTRAST TECHNIQUE: Multidetector CT imaging of the chest was performed following the standard protocol without IV contrast. COMPARISON:  Multiple priors, most recently chest CT 11/03/2015. FINDINGS: Mediastinum/Lymph Nodes: Heart size is normal. There is no significant pericardial fluid, thickening or pericardial calcification. There is atherosclerosis of the thoracic aorta, the great vessels of the mediastinum and the coronary arteries, including calcified atherosclerotic plaque in the distal left main, left anterior descending and left circumflex coronary arteries. Prominent soft tissue in the right hilar region likely reflects underlying lymphoid enlargement (which cannot be measured accurately on today's noncontrast CT  examination). Enlarged subcarinal lymph node measuring 13 mm in short axis. Enlarged low right paratracheal lymph node measuring up to 15 mm in short axis. High right paratracheal lymph node measuring up to 15 mm in short axis. Esophagus is unremarkable in appearance. No axillary lymphadenopathy. Lungs/Pleura: Compared to the prior examination from 11/03/2015 the airspace consolidation in the right lower lobe and to a lesser extent in the right middle lobe has largely resolved. There continues to be extensive areas in varicose and cystic bronchiectasis throughout the right lower and right middle lobes, where these bronchiectatic bronchi are largely filled with retained secretions. In the right lower lobe there also several cavitary areas, largest of which has thick walls and measures up to 3.3 x 2.2 cm (image 32 of series 3). Diffuse bronchial wall thickening. Patchy areas of more severe thickening of the peribronchovascular interstitium with some peribronchovascular ground-glass attenuation noted throughout the lungs bilaterally, most evident in the anterior aspects of the upper lobes of the lungs where there is also some very subtle peribronchovascular tree-in-bud micronodularity, which likely reflects areas of mucoid impaction within terminal bronchioles. Mild centrilobular and paraseptal emphysema. New ground-glass attenuation nodule in the medial aspect of the left upper lobe (image 7 of series 3) measuring 12 x 13 mm. No pleural effusions. Upper Abdomen: Unremarkable. Musculoskeletal/Soft Tissues: There are no aggressive appearing lytic or blastic lesions noted in the visualized portions of the skeleton. IMPRESSION: 1. Sequela of prior necrotizing pneumonia in the right lower and middle lobes redemonstrated with multiple cavitary areas and extensive varicose and cystic bronchiectasis, similar to prior examinations. Compared to the most recent prior study, the extent of associated airspace consolidation has  significantly improved. Right hilar and mediastinal lymphadenopathy, similar to prior examinations, likely reactive. 2. Mild centrilobular and paraseptal emphysema; imaging findings suggestive of underlying COPD. 3. Atherosclerosis, including left main and 2 vessel coronary artery disease. Please note that although the presence of coronary artery calcium documents the presence of coronary artery disease, the severity of this disease and any potential stenosis cannot be assessed on this non-gated CT examination. Assessment for potential risk factor modification, dietary therapy or pharmacologic therapy may be warranted, if clinically indicated. 4. Additional incidental findings, as above. Electronically Signed  By: Vinnie Langton M.D.   On: 01/09/2016 15:43    CBC  Recent Labs Lab 01/09/16 2045 01/10/16 0210 01/10/16 0638 01/11/16 0600 01/12/16 0248  WBC 9.8 5.7 4.4 7.5 7.2  HGB 12.6 11.9* 12.1 11.3* 11.3*  HCT 37.0 37.0 36.6 35.4* 36.0  PLT 269 257 224 229 242  MCV 84.7 85.3 84.7 87.4 88.0  MCH 28.8 27.4 28.0 27.9 27.6  MCHC 34.1 32.2 33.1 31.9 31.4  RDW 14.7 14.7 14.8 14.7 14.9  LYMPHSABS 1.0  --  0.9 1.1 2.6  MONOABS 0.2  --  0.0* 0.3 0.7  EOSABS 0.0  --  0.0 0.0 0.0  BASOSABS 0.0  --  0.0 0.0 0.0    Chemistries   Recent Labs Lab 01/09/16 1706 01/09/16 2045 01/10/16 0210 01/10/16 0638 01/11/16 0600  NA 124* 131*  --  132* 136  K 5.1 3.4*  --  3.6 3.9  CL 82* 88*  --  91* 97*  CO2 32* 31  --  30 32  GLUCOSE 108* 114*  --  164* 136*  BUN 6* <5*  --  6 9  CREATININE 0.52 0.47 0.56 0.42* 0.44  CALCIUM 8.9 8.9  --  8.6* 8.8*  AST 42*  --   --   --   --   ALT 15  --   --   --   --   ALKPHOS 109  --   --   --   --   BILITOT 0.6  --   --   --   --    ------------------------------------------------------------------------------------------------------------------ estimated creatinine clearance is 58 mL/min (by C-G formula based on Cr of  0.44). ------------------------------------------------------------------------------------------------------------------ No results for input(s): HGBA1C in the last 72 hours. ------------------------------------------------------------------------------------------------------------------ No results for input(s): CHOL, HDL, LDLCALC, TRIG, CHOLHDL, LDLDIRECT in the last 72 hours. ------------------------------------------------------------------------------------------------------------------ No results for input(s): TSH, T4TOTAL, T3FREE, THYROIDAB in the last 72 hours.  Invalid input(s): FREET3 ------------------------------------------------------------------------------------------------------------------ No results for input(s): VITAMINB12, FOLATE, FERRITIN, TIBC, IRON, RETICCTPCT in the last 72 hours.  Coagulation profile No results for input(s): INR, PROTIME in the last 168 hours.  No results for input(s): DDIMER in the last 72 hours.  Cardiac Enzymes  Recent Labs Lab 01/09/16 2045  TROPONINI <0.03   ------------------------------------------------------------------------------------------------------------------ Invalid input(s): POCBNP  No results for input(s): GLUCAP in the last 72 hours.   Rucha Wissinger M.D. [facility] 01/12/2016, 10:41 AM  Pager: [phone] Between 7am to 7pm - call Pager - [phone]  After 7pm go to [email or web address] - password [email or web address]  Call night coverage person covering after 7pm

## 2016-01-13 ENCOUNTER — Inpatient Hospital Stay (HOSPITAL_COMMUNITY): Payer: Managed Care, Other (non HMO)

## 2016-01-13 DIAGNOSIS — J449 Chronic obstructive pulmonary disease, unspecified: Secondary | ICD-10-CM

## 2016-01-13 DIAGNOSIS — E440 Moderate protein-calorie malnutrition: Secondary | ICD-10-CM

## 2016-01-13 DIAGNOSIS — J189 Pneumonia, unspecified organism: Secondary | ICD-10-CM

## 2016-01-13 DIAGNOSIS — J471 Bronchiectasis with (acute) exacerbation: Secondary | ICD-10-CM

## 2016-01-13 NOTE — Progress Notes (Signed)
[facility]                                                                              Patient Demographics  Catherine Kelley, is a 65 y.o. female, DOB - 09-16-1951, FEO:[id number]  Admit date - 01/09/2016   Admitting Physician Reubin Milan, MD  Outpatient Primary MD for the patient is SMITH,KRISTI, MD  LOS - 4   Chief Complaint  Patient presents with  . Shortness of Breath       Brief HPI   Catherine Kelley is a 65 y.o. female with a past medical history of hypertension, COPD, anxiety, depression, tobacco abuse disorder, prior necrotizing pneumonia presented from the urgent care center due to fever, progressively worse dyspnea, productive cough and wheezing after treatment there with bronchodilators and corticosteroids did not relieve her hypoxia, so she was sent to the emergency department.  ED workup showed ABG with mild CO2 retention, mild hypoxia, hyponatremia 131, hypokalemia 3.4, no leukocytosis. Chest x-ray showed lower lobe and right middle lobe airspace disease without significant interval change compared with one 1/11/7. CT chest showed prior necrotizing pneumonia and right lower and middle lobes with multiple cavitary areas and bronchiectasis, mild centrilobular and paraseptal emphysema with underlying COPD. Seemed to be improving with treatment of healthcare associated pneumonia but started complaining of dyspnea again since 2/11. Pulmonology consulted 2/12.  Assessment & Plan    Principal Problem:   HCAP (healthcare-associated pneumonia)/COPD exacerbation/acute hypoxic respiratory failure with underlying history of bronchiectasis (followed by Dr. Chase Caller, Pulmonology) - Treated with scheduled DuoNeb nebs, IV Solu-Medrol, IV vancomycin and aztreonam (on IV antibiotics since 2/8). - Placed on flutter valve, Mucinex  - Sputum culture unfortunately was not sent. Urine streptococcal antigen: Negative. Urine Legionella antigen: Negative.  - Home O2 evaluation  prior to discharge - 2-D echo results as below: Unremarkable and does not suggest cor pulmonale. - Blood cultures 2: Negative to date. DC vancomycin 2/10. Sputum culture from 11/02/15 had shown Pseudomonas. Consider changing to oral levofloxacin at discharge. - Changed steroids to oral prednisone. Added chest PT. - Complaining of dyspnea since 2/11. Repeat chest x-ray shows persistent pneumonia in the right middle and lower lobes. Requested pulmonology consultation on 2/12.  Active Problems:    Hyponatremia: Patient presented with sodium of 124. Unclear etiology but may have been due to dehydration. Serum and urine osmolarity normal. Hyponatremia resolved. DC IV fluids.      Essential hypertension - Currently stable/controlled.  Anxiety/depression - Continue Xanax - Continue Depakote, nortriptyline  Mild hypokalemia - Replaced  Tobacco disorder Counseled on the nicotine cessation, continue nicotine patch  Anemia - Periodically follow CBCs as outpatient.   Code Status: Full code Family Communication: None at bedside. Disposition Plan: Continue IV antibiotics for additional 24 hours. Await pulmonology recommendations. Physical therapy evaluation do not see home health needs.  Time Spent in minutes  25 minutes  Procedures  None  Consults   Pulmonology  DVT Prophylaxis  Lovenox   Medications  Scheduled Meds: . ALPRAZolam  0.5 mg Oral QHS  . aztreonam  1 g Intravenous 3 times per day  . divalproex  500 mg  Oral QHS  . enoxaparin (LOVENOX) injection  40 mg Subcutaneous Q24H  . guaiFENesin  1,200 mg Oral BID  . ipratropium  2 spray Each Nare BID  . ipratropium-albuterol  3 mL Nebulization TID  . nadolol  20 mg Oral Daily  . nortriptyline  50 mg Oral QHS  . polyethylene glycol  17 g Oral BID  . predniSONE  40 mg Oral Q breakfast  . senna  2 tablet Oral Daily  . sodium chloride flush  3 mL Intravenous Q12H   Continuous Infusions:   PRN Meds:.albuterol, benzonatate,  HYDROcodone-acetaminophen, ondansetron **OR** [DISCONTINUED] ondansetron (ZOFRAN) IV, senna-docusate, tiZANidine   Antibiotics   Anti-infectives    Start     Dose/Rate Route Frequency Ordered Stop   01/10/16 1000  vancomycin (VANCOCIN) 500 mg in sodium chloride 0.9 % 100 mL IVPB  Status:  Discontinued     500 mg 100 mL/hr over 60 Minutes Intravenous Every 12 hours 01/09/16 2218 01/11/16 1418   01/10/16 0600  aztreonam (AZACTAM) 1 g in dextrose 5 % 50 mL IVPB     1 g 100 mL/hr over 30 Minutes Intravenous 3 times per day 01/10/16 0217     01/09/16 2100  vancomycin (VANCOCIN) IVPB 1000 mg/200 mL premix     1,000 mg 200 mL/hr over 60 Minutes Intravenous  Once 01/09/16 2046 01/09/16 2310   01/09/16 2045  aztreonam (AZACTAM) 2 g in dextrose 5 % 50 mL IVPB     2 g 100 mL/hr over 30 Minutes Intravenous  Once 01/09/16 2041 01/09/16 2230        Subjective:   Complaining of dyspnea since yesterday. Cough still productive of green sputum but volume decreasing. Had BM yesterday.  Objective:   Filed Vitals:   01/12/16 2006 01/12/16 2051 01/13/16 0851 01/13/16 1230  BP: 146/63   132/69  Pulse: 92 85  84  Temp: 97.3 F (36.3 C)   97.6 F (36.4 C)  TempSrc: Oral   Oral  Resp: '18 18  18  '$ Height:      Weight:      SpO2: 93% 94% 94% 93%     Wt Readings from Last 3 Encounters:  01/12/16 57.788 kg (127 lb 6.4 oz)  01/09/16 58.877 kg (129 lb 12.8 oz)  12/19/15 59.875 kg (132 lb)     Intake/Output Summary (Last 24 hours) at 01/13/16 1347 Last data filed at 01/13/16 0900  Gross per 24 hour  Intake  14513 ml  Output   1750 ml  Net  12763 ml    Exam  General: Pleasant middle-aged female lying comfortably propped up in bed seen eating breakfast this morning.  CVS: S1 S2 auscultated, no rubs, murmurs or gallops. Regular rate and rhythm.   Respiratory: coarse breath sounds bilaterally. Scattered basal coarse Velcro-like chronic sounding crackles, right >left. No wheezing or  rhonchi. No increased work of breathing.  Abdomen: Soft, nontender, nondistended, + bowel sounds  Ext: no cyanosis clubbing or edema  Neuro: AAOx3, Cr N's II- XII. Strength 5/5 upper and lower extremities bilaterally. Alert and oriented  Psych: Normal affect and demeanor, alert and oriented x3    Data Review   Micro Results Recent Results (from the past 240 hour(s))  Blood culture (routine x 2)     Status: None (Preliminary result)   Collection Time: 01/09/16  9:10 PM  Result Value Ref Range Status   Specimen Description BLOOD RIGHT ARM  Final   Special Requests BOTTLES DRAWN AEROBIC AND ANAEROBIC 5CC  Final   Culture NO GROWTH 4 DAYS  Final   Report Status PENDING  Incomplete  Blood culture (routine x 2)     Status: None (Preliminary result)   Collection Time: 01/09/16  9:15 PM  Result Value Ref Range Status   Specimen Description BLOOD LEFT HAND  Final   Special Requests BOTTLES DRAWN AEROBIC AND ANAEROBIC 5CC  Final   Culture NO GROWTH 4 DAYS  Final   Report Status PENDING  Incomplete    Radiology Reports Dg Chest 2 View  01/13/2016  CLINICAL DATA:  Shortness of breath for 4 days EXAM: CHEST  2 VIEW COMPARISON:  January 09, 2016 FINDINGS: There is persistent airspace consolidation consistent with pneumonia in the right middle and right lower lobe regions. No new opacity is identified. Left lung is clear. Heart size and pulmonary vascularity are normal. No adenopathy. No bone lesions. IMPRESSION: Persistent pneumonia in the right middle and lower lobes. No new opacity. No change in cardiac silhouette. Electronically Signed   By: Lowella Grip III M.D.   On: 01/13/2016 09:28   Dg Chest 2 View  01/09/2016  CLINICAL DATA:  Cough, shortness of breath, dizziness, wheezing EXAM: CHEST  2 VIEW COMPARISON:  CT chest 01/09/2016 FINDINGS: There is right lower lobe and right middle lobe airspace disease without significant interval change compared with 12/12/2015. There is no pleural  effusion or pneumothorax. The heart and mediastinal contours are unremarkable. The osseous structures are unremarkable. IMPRESSION: Lower lobe and right middle lobe airspace disease without significant interval change compared with 12/12/2015. Electronically Signed   By: Kathreen Devoid   On: 01/09/2016 20:22   Ct Chest Wo Contrast  01/09/2016  CLINICAL DATA:  65 year old female with prior history of pneumonia (hospitalized in December 2016), with worsening cough, congestion, wheezing and shortness of breath. EXAM: CT CHEST WITHOUT CONTRAST TECHNIQUE: Multidetector CT imaging of the chest was performed following the standard protocol without IV contrast. COMPARISON:  Multiple priors, most recently chest CT 11/03/2015. FINDINGS: Mediastinum/Lymph Nodes: Heart size is normal. There is no significant pericardial fluid, thickening or pericardial calcification. There is atherosclerosis of the thoracic aorta, the great vessels of the mediastinum and the coronary arteries, including calcified atherosclerotic plaque in the distal left main, left anterior descending and left circumflex coronary arteries. Prominent soft tissue in the right hilar region likely reflects underlying lymphoid enlargement (which cannot be measured accurately on today's noncontrast CT examination). Enlarged subcarinal lymph node measuring 13 mm in short axis. Enlarged low right paratracheal lymph node measuring up to 15 mm in short axis. High right paratracheal lymph node measuring up to 15 mm in short axis. Esophagus is unremarkable in appearance. No axillary lymphadenopathy. Lungs/Pleura: Compared to the prior examination from 11/03/2015 the airspace consolidation in the right lower lobe and to a lesser extent in the right middle lobe has largely resolved. There continues to be extensive areas in varicose and cystic bronchiectasis throughout the right lower and right middle lobes, where these bronchiectatic bronchi are largely filled with retained  secretions. In the right lower lobe there also several cavitary areas, largest of which has thick walls and measures up to 3.3 x 2.2 cm (image 32 of series 3). Diffuse bronchial wall thickening. Patchy areas of more severe thickening of the peribronchovascular interstitium with some peribronchovascular ground-glass attenuation noted throughout the lungs bilaterally, most evident in the anterior aspects of the upper lobes of the lungs where there is also some very subtle peribronchovascular tree-in-bud micronodularity, which likely reflects  areas of mucoid impaction within terminal bronchioles. Mild centrilobular and paraseptal emphysema. New ground-glass attenuation nodule in the medial aspect of the left upper lobe (image 7 of series 3) measuring 12 x 13 mm. No pleural effusions. Upper Abdomen: Unremarkable. Musculoskeletal/Soft Tissues: There are no aggressive appearing lytic or blastic lesions noted in the visualized portions of the skeleton. IMPRESSION: 1. Sequela of prior necrotizing pneumonia in the right lower and middle lobes redemonstrated with multiple cavitary areas and extensive varicose and cystic bronchiectasis, similar to prior examinations. Compared to the most recent prior study, the extent of associated airspace consolidation has significantly improved. Right hilar and mediastinal lymphadenopathy, similar to prior examinations, likely reactive. 2. Mild centrilobular and paraseptal emphysema; imaging findings suggestive of underlying COPD. 3. Atherosclerosis, including left main and 2 vessel coronary artery disease. Please note that although the presence of coronary artery calcium documents the presence of coronary artery disease, the severity of this disease and any potential stenosis cannot be assessed on this non-gated CT examination. Assessment for potential risk factor modification, dietary therapy or pharmacologic therapy may be warranted, if clinically indicated. 4. Additional incidental  findings, as above. Electronically Signed   By: Vinnie Langton M.D.   On: 01/09/2016 15:43    CBC  Recent Labs Lab 01/09/16 2045 01/10/16 0210 01/10/16 0638 01/11/16 0600 01/12/16 0248  WBC 9.8 5.7 4.4 7.5 7.2  HGB 12.6 11.9* 12.1 11.3* 11.3*  HCT 37.0 37.0 36.6 35.4* 36.0  PLT 269 257 224 229 242  MCV 84.7 85.3 84.7 87.4 88.0  MCH 28.8 27.4 28.0 27.9 27.6  MCHC 34.1 32.2 33.1 31.9 31.4  RDW 14.7 14.7 14.8 14.7 14.9  LYMPHSABS 1.0  --  0.9 1.1 2.6  MONOABS 0.2  --  0.0* 0.3 0.7  EOSABS 0.0  --  0.0 0.0 0.0  BASOSABS 0.0  --  0.0 0.0 0.0    Chemistries   Recent Labs Lab 01/09/16 1706 01/09/16 2045 01/10/16 0210 01/10/16 0638 01/11/16 0600  NA 124* 131*  --  132* 136  K 5.1 3.4*  --  3.6 3.9  CL 82* 88*  --  91* 97*  CO2 32* 31  --  30 32  GLUCOSE 108* 114*  --  164* 136*  BUN 6* <5*  --  6 9  CREATININE 0.52 0.47 0.56 0.42* 0.44  CALCIUM 8.9 8.9  --  8.6* 8.8*  AST 42*  --   --   --   --   ALT 15  --   --   --   --   ALKPHOS 109  --   --   --   --   BILITOT 0.6  --   --   --   --    ------------------------------------------------------------------------------------------------------------------ estimated creatinine clearance is 58 mL/min (by C-G formula based on Cr of 0.44). ------------------------------------------------------------------------------------------------------------------ No results for input(s): HGBA1C in the last 72 hours. ------------------------------------------------------------------------------------------------------------------ No results for input(s): CHOL, HDL, LDLCALC, TRIG, CHOLHDL, LDLDIRECT in the last 72 hours. ------------------------------------------------------------------------------------------------------------------ No results for input(s): TSH, T4TOTAL, T3FREE, THYROIDAB in the last 72 hours.  Invalid input(s):  FREET3 ------------------------------------------------------------------------------------------------------------------ No results for input(s): VITAMINB12, FOLATE, FERRITIN, TIBC, IRON, RETICCTPCT in the last 72 hours.  Coagulation profile No results for input(s): INR, PROTIME in the last 168 hours.  No results for input(s): DDIMER in the last 72 hours.  Cardiac Enzymes  Recent Labs Lab 01/09/16 2045  TROPONINI <0.03   ------------------------------------------------------------------------------------------------------------------ Invalid input(s): POCBNP  No results for input(s): GLUCAP in the  last 72 hours.   Vernell Leep, MD, FACP, FHM. [facility] Pager [phone]  If 7PM-7AM, please contact night-coverage [email or web address] Password [facility] 01/13/2016, 1:47 PM

## 2016-01-13 NOTE — Consult Note (Signed)
Name: Catherine Kelley MRN: [id number] DOB: Jun 06, 1951    ADMISSION DATE:  01/09/2016 CONSULTATION DATE: 01/13/16  REFERRING MD :  Algis Liming  CHIEF COMPLAINT:  Short of breath  BRIEF PATIENT DESCRIPTION: 65yo F smoker with RLL/RMLbronchiectasis w cavity, followed at [facility] early December after a fall. Swallow eval then only mild abnl. Cx sputum pseudomonas. Presented to UC with new for her wheezing and incr SOB. Sent to hosp for hypoxemia.PCCM asked to re-evaluate.  SIGNIFICANT EVENTS    STUDIES:  CT chest 2/8-  Bronchiectasis and cavity RML, RLL. Much inspissated mucus PFT 12/19/15 -showed an FEV1 of 1.45 L, which was 64% of predicted, ratio of 72%, no significant bronchodilator response, FVC at 70%, air trapping noted. Diffusing capacity decreased at 67% Swallow eval (MBS) 12/4 >> mild dysphagia, no obvious aspiration on this eval but confirms moderate aspiration risk. Dys II diet recommended  HISTORY OF PRESENT ILLNESS:  64 yoF smoker interviewed w husband present. Hx recurrent pneumonias, bronchiectasis. No similar family hx. Most recent [place] visit 1/18. She admits some fever and chills w onset increased wheeze and dyspnea leading to visit UC then sent here for hypoxia. Admission CXR-persistent pneumonia RML/RLL. No leukocytosis. Today she says cough has become more productive and sputum clearing green to white. No blood, chest pain or peripheral adenopathy noted.  PAST MEDICAL HISTORY :   has a past medical history of Hypertension; Tobacco abuse; Anxiety; Depression; Emphysematous COPD ([place]); COPD (chronic obstructive pulmonary disease) ([place]); History of echocardiogram (12/24/2006); History of cardiovascular stress test (08/15/2004); Diastolic dysfunction; Allergy; Asthma; Heart murmur; Pneumonia; Chronic bronchitis ([place]); and GERD (gastroesophageal reflux disease).  has past surgical history that includes Tubal ligation (1986).   Prior to Admission medications   Medication  Sig Start Date End Date Taking? Authorizing Provider  ALPRAZolam Duanne Moron) 0.5 MG tablet Take 0.5 mg by mouth at bedtime.    Yes Historical Provider, MD  clotrimazole (GYNE-LOTRIMIN 3) 2 % vaginal cream Place 1 Applicatorful vaginally at bedtime. Patient taking differently: Place 1 Applicatorful vaginally at bedtime as needed (itching).  11/08/15  Yes Reyne Dumas, MD  divalproex (DEPAKOTE ER) 500 MG 24 hr tablet Take 500 mg by mouth at bedtime.  02/22/12  Yes Historical Provider, MD  furosemide (LASIX) 20 MG tablet TAKE 2 TABLETS BY MOUTH EVERY DAY Patient taking differently: Take 20 mg by mouth daily 05/25/15  Yes [name], MD  guaiFENesin (MUCINEX) 600 MG 12 hr tablet Take 1 tablet (600 mg total) by mouth 2 (two) times daily. 11/08/15  Yes Reyne Dumas, MD  HYDROcodone-acetaminophen (NORCO/VICODIN) 5-325 MG tablet Take 1 tablet by mouth every 6 (six) hours as needed for moderate pain. Patient taking differently: Take 0.5-1 tablets by mouth every 6 (six) hours as needed for moderate pain.  12/12/15  Yes Wardell Honour, MD  ipratropium (ATROVENT) 0.03 % nasal spray Place 2 sprays into both nostrils 2 (two) times daily. Reported on 12/19/2015 07/16/15  Yes Historical Provider, MD  mometasone-formoterol (DULERA) 100-5 MCG/ACT AERO Take 2 puffs first thing in am and then another 2 puffs about 12 hours later. 09/27/15  Yes Tanda Rockers, MD  nadolol (CORGARD) 40 MG tablet Take 20 mg by mouth daily.  09/28/15  Yes Historical Provider, MD  nortriptyline (PAMELOR) 50 MG capsule Take 50 mg by mouth at bedtime. 11/12/15  Yes Historical Provider, MD  tiZANidine (ZANAFLEX) 4 MG tablet Take 1 tablet (4 mg total) by mouth every 8 (eight) hours as needed for muscle  spasms. 12/12/15  Yes Wardell Honour, MD  albuterol (PROVENTIL) (2.5 MG/3ML) 0.083% nebulizer solution Take 3 mLs (2.5 mg total) by nebulization every 2 (two) hours as needed for wheezing. Patient not taking: Reported on 12/19/2015 11/08/15   Reyne Dumas, MD  benzonatate (TESSALON) 100 MG capsule TAKE 1 TO 2 CAPSULES BY MOUTH 3 TIMES DAILY AS NEEDED FOR COUGH Patient not taking: Reported on 01/09/2016 10/03/15   Wardell Honour, MD  cefdinir (OMNICEF) 300 MG capsule Take 2 capsules (600 mg total) by mouth daily. Patient not taking: Reported on 01/09/2016 12/12/15   Wardell Honour, MD  feeding supplement, ENSURE ENLIVE, (ENSURE ENLIVE) LIQD Take 237 mLs by mouth 2 (two) times daily between meals. Patient not taking: Reported on 01/09/2016 11/08/15   Reyne Dumas, MD  fluticasone (FLONASE) 50 MCG/ACT nasal spray PLACE 2 SPRAYS INTO BOTH NOSTRILS DAILY. Patient not taking: Reported on 01/09/2016 12/31/15   Brand Males, MD  nortriptyline (PAMELOR) 25 MG capsule Take 1 capsule (25 mg total) by mouth at bedtime. Patient not taking: Reported on 01/09/2016 11/08/15   Reyne Dumas, MD  potassium chloride (KLOR-CON) 20 MEQ packet Take 40 mEq by mouth daily. Patient not taking: Reported on 01/09/2016 11/08/15   Reyne Dumas, MD  saccharomyces boulardii (FLORASTOR) 250 MG capsule Take 1 capsule (250 mg total) by mouth 2 (two) times daily. Patient not taking: Reported on 01/09/2016 08/21/15   Charlynne Cousins, MD   Allergies  Allergen Reactions  . Ceclor [Cefaclor] Hives  . Escitalopram Oxalate     Pt does not recall ever taking medication  . Sertraline Hcl     Severe Headache  . Sulfa Drugs Cross Reactors Hives and Rash    Hives and rash    FAMILY HISTORY:  family history includes Asthma in her maternal grandmother; Breast cancer in her maternal aunt; Heart disease in her father; Hyperlipidemia in her father; Hypertension in her father; Stroke in her mother. SOCIAL HISTORY:  reports that she has been smoking Cigarettes.  She has a 42 pack-year smoking history. She has never used smokeless tobacco. She reports that she drinks alcohol. She reports that she does not use illicit drugs.  REVIEW OF SYSTEMS:  += pos Constitutional:  +fever, chills, weight  loss, malaise/fatigue and diaphoresis.  HENT: Negative for hearing loss, ear pain, nosebleeds, congestion, sore throat, neck pain, tinnitus and ear discharge.   Eyes: Negative for blurred vision, double vision, photophobia, pain, discharge and redness.  Respiratory: + cough, hemoptysis, +sputum production, +shortness of breath, +wheezing and stridor.   Cardiovascular: Negative for chest pain, palpitations, orthopnea, claudication, leg swelling and PND.  Gastrointestinal: Negative for heartburn, nausea, vomiting, abdominal pain, diarrhea, constipation, blood in stool and melena.  Genitourinary: Negative for dysuria, urgency, frequency, hematuria and flank pain.  Musculoskeletal: Negative for myalgias, back pain, joint pain and falls.  Skin: Negative for itching and rash.  Neurological: Negative for dizziness, tingling, tremors, sensory change, speech change, focal weakness, seizures, loss of consciousness, weakness and headaches.  Endo/Heme/Allergies: Negative for environmental allergies and polydipsia. Does not bruise/bleed easily.  SUBJECTIVE:   VITAL SIGNS: Temp:  [97.3 F (36.3 C)-97.6 F (36.4 C)] 97.6 F (36.4 C) (02/12 1230) Pulse Rate:  [84-92] 84 (02/12 1230) Resp:  [18-20] 18 (02/12 1230) BP: (129-146)/(63-69) 132/69 mmHg (02/12 1230) SpO2:  [93 %-97 %] 93 % (02/12 1230)  PHYSICAL EXAMINATION: General:  Chronically ill F lying in bed, watching TV, NAD, thin Neuro: oriented, non-focal HEENT:  Teeth stained, speech  clear,no stridor,  Cardiovascular:  RRR, no mgr, no jvd, no edema Lungs: rhonchi RLL, unlabored on room air, wet cough Abdomen:  Thin, soft, no HSM, BS+ Musculoskeletal:  Cachectic, sits w/o assistance Skin:  No rash or bruising   Recent Labs Lab 01/09/16 2045 01/10/16 0210 01/10/16 0638 01/11/16 0600  NA 131*  --  132* 136  K 3.4*  --  3.6 3.9  CL 88*  --  91* 97*  CO2 31  --  30 32  BUN <5*  --  6 9  CREATININE 0.47 0.56 0.42* 0.44  GLUCOSE 114*  --   164* 136*    Recent Labs Lab 01/10/16 0638 01/11/16 0600 01/12/16 0248  HGB 12.1 11.3* 11.3*  HCT 36.6 35.4* 36.0  WBC 4.4 7.5 7.2  PLT 224 229 242   Dg Chest 2 View  01/13/2016  CLINICAL DATA:  Shortness of breath for 4 days EXAM: CHEST  2 VIEW COMPARISON:  January 09, 2016 FINDINGS: There is persistent airspace consolidation consistent with pneumonia in the right middle and right lower lobe regions. No new opacity is identified. Left lung is clear. Heart size and pulmonary vascularity are normal. No adenopathy. No bone lesions. IMPRESSION: Persistent pneumonia in the right middle and lower lobes. No new opacity. No change in cardiac silhouette. Electronically Signed   By: Lowella Grip III M.D.   On: 01/13/2016 09:28   I reviewed recent CXR and CT images. Significant saccular bronchiectasis RML, RLL with inspissated mucus and dominant cavity.  ASSESSMENT / PLAN: Bronchiectasis w exacerbation-  It is time to see if we can assist airway clearing with pneumatic vest. Continue antibiotics, mobilization  COPD still smoking- smoking cessation counseled  Protein calorie malnutrition- Dietary     Pulmonary and [place] Pager: [phone]  01/13/2016, 2:48 PM

## 2016-01-14 LAB — CULTURE, BLOOD (ROUTINE X 2)
CULTURE: NO GROWTH
Culture: NO GROWTH

## 2016-01-14 LAB — EXPECTORATED SPUTUM ASSESSMENT W GRAM STAIN, RFLX TO RESP C

## 2016-01-14 MED ORDER — PREDNISONE 20 MG PO TABS
30.0000 mg | ORAL_TABLET | Freq: Every day | ORAL | Status: DC
  Administered 2016-01-15: 30 mg via ORAL
  Filled 2016-01-14: qty 1

## 2016-01-14 MED ORDER — NYSTATIN 100000 UNIT/ML MT SUSP
5.0000 mL | Freq: Four times a day (QID) | OROMUCOSAL | Status: DC
  Administered 2016-01-14 – 2016-01-15 (×5): 500000 [IU] via ORAL
  Filled 2016-01-14 (×4): qty 5

## 2016-01-14 MED ORDER — LEVOFLOXACIN 750 MG PO TABS
750.0000 mg | ORAL_TABLET | Freq: Every day | ORAL | Status: DC
Start: 2016-01-14 — End: 2016-01-15
  Administered 2016-01-14 – 2016-01-15 (×2): 750 mg via ORAL
  Filled 2016-01-14 (×2): qty 1

## 2016-01-14 NOTE — Progress Notes (Signed)
Resting comfortably in bed.No apparent distress

## 2016-01-14 NOTE — Progress Notes (Signed)
[facility]                                                                              Patient Demographics  Catherine Kelley, is a 65 y.o. female, DOB - 15-Oct-1951, LOV:[id number]  Admit date - 01/09/2016   Admitting Physician Reubin Milan, MD  Outpatient Primary MD for the patient is Kelley,KRISTI, MD  LOS - 5   Chief Complaint  Patient presents with  . Shortness of Breath       Brief HPI   Catherine Kelley is a 65 y.o. female with a past medical history of hypertension, COPD, anxiety, depression, tobacco abuse disorder, prior necrotizing pneumonia presented from the urgent care center due to fever, progressively worse dyspnea, productive cough and wheezing after treatment there with bronchodilators and corticosteroids did not relieve her hypoxia, so she was sent to the emergency department.  ED workup showed ABG with mild CO2 retention, mild hypoxia, hyponatremia 131, hypokalemia 3.4, no leukocytosis. Chest x-ray showed lower lobe and right middle lobe airspace disease without significant interval change compared with one 1/11/7. CT chest showed prior necrotizing pneumonia and right lower and middle lobes with multiple cavitary areas and bronchiectasis, mild centrilobular and paraseptal emphysema with underlying COPD. Seemed to be improving with treatment of healthcare associated pneumonia but started complaining of dyspnea again since 2/11. Pulmonology consulted 2/12.  Assessment & Plan    Principal Problem:   HCAP (healthcare-associated pneumonia)/COPD exacerbation/acute hypoxic respiratory failure with underlying history of bronchiectasis (followed by Dr. Chase Caller, Pulmonology) - Treated with scheduled DuoNeb nebs, IV Solu-Medrol, IV vancomycin and aztreonam (on IV antibiotics since 2/8). - Placed on flutter valve, Mucinex  - Sputum culture unfortunately was not sent. Urine streptococcal antigen: Negative. Urine Legionella antigen: Negative.  - Home O2 evaluation  prior to discharge - 2-D echo results as below: Unremarkable and does not suggest cor pulmonale. - Blood cultures 2: Negative to date. DC vancomycin 2/10. Sputum culture from 11/02/15 had shown Pseudomonas. Changed IV antibiotics to oral levofloxacin on 2/13-complete 7-10 days treatment. - Changed steroids to oral prednisone.  - Complaining of dyspnea since 2/11. Repeat chest x-ray shows persistent pneumonia in the right middle and lower lobes. Pulmonology consultation appreciated-added chest Vest.  - Desaturation protocol: Oxygen saturation on room air with activity: 88-94 percent. Needs repeating prior to discharge.   Active Problems:    Hyponatremia: Patient presented with sodium of 124. Unclear etiology but may have been due to dehydration. Serum and urine osmolarity normal. Hyponatremia resolved. DC IV fluids.      Essential hypertension - Currently stable/controlled.  Anxiety/depression - Continue Xanax - Continue Depakote, nortriptyline  Mild hypokalemia - Replaced  Tobacco disorder Counseled on the nicotine cessation, continue nicotine patch  Anemia - Periodically follow CBCs as outpatient.   Code Status: Full code Family Communication: None at bedside. Disposition Plan: changed to oral antibiotics today. Monitor overnight and if stable and cleared by pulmonology, possible discharge home 2/14. Physical therapy evaluation do not see home health needs.  Time Spent in minutes  25 minutes  Procedures  None  Consults   Pulmonology  DVT Prophylaxis  Lovenox  Medications  Scheduled Meds: . ALPRAZolam  0.5 mg Oral QHS  . aztreonam  1 g Intravenous 3 times per day  . divalproex  500 mg Oral QHS  . enoxaparin (LOVENOX) injection  40 mg Subcutaneous Q24H  . guaiFENesin  1,200 mg Oral BID  . ipratropium  2 spray Each Nare BID  . ipratropium-albuterol  3 mL Nebulization TID  . nadolol  20 mg Oral Daily  . nortriptyline  50 mg Oral QHS  . nystatin  5 mL Oral QID  .  polyethylene glycol  17 g Oral BID  . predniSONE  40 mg Oral Q breakfast  . senna  2 tablet Oral Daily  . sodium chloride flush  3 mL Intravenous Q12H   Continuous Infusions:   PRN Meds:.albuterol, benzonatate, HYDROcodone-acetaminophen, ondansetron **OR** [DISCONTINUED] ondansetron (ZOFRAN) IV, senna-docusate, tiZANidine   Antibiotics   Anti-infectives    Start     Dose/Rate Route Frequency Ordered Stop   01/10/16 1000  vancomycin (VANCOCIN) 500 mg in sodium chloride 0.9 % 100 mL IVPB  Status:  Discontinued     500 mg 100 mL/hr over 60 Minutes Intravenous Every 12 hours 01/09/16 2218 01/11/16 1418   01/10/16 0600  aztreonam (AZACTAM) 1 g in dextrose 5 % 50 mL IVPB     1 g 100 mL/hr over 30 Minutes Intravenous 3 times per day 01/10/16 0217     01/09/16 2100  vancomycin (VANCOCIN) IVPB 1000 mg/200 mL premix     1,000 mg 200 mL/hr over 60 Minutes Intravenous  Once 01/09/16 2046 01/09/16 2310   01/09/16 2045  aztreonam (AZACTAM) 2 g in dextrose 5 % 50 mL IVPB     2 g 100 mL/hr over 30 Minutes Intravenous  Once 01/09/16 2041 01/09/16 2230        Subjective:   States that she is able to cough up more sputum and the sputum has changed color to tan color. Dyspnea improved. Anxious to go home.   Objective:   Filed Vitals:   01/14/16 0739 01/14/16 1109 01/14/16 1134 01/14/16 1415  BP:  105/40    Pulse:  99    Temp:      TempSrc:      Resp:      Height:      Weight:      SpO2: 95% 93% 92% 94%   temperature 97.69F, respiratory rate 16/m    Wt Readings from Last 3 Encounters:  01/14/16 55.067 kg (121 lb 6.4 oz)  01/09/16 58.877 kg (129 lb 12.8 oz)  12/19/15 59.875 kg (132 lb)     Intake/Output Summary (Last 24 hours) at 01/14/16 1631 Last data filed at 01/14/16 1411  Gross per 24 hour  Intake   1190 ml  Output   1950 ml  Net   -760 ml    Exam  General: Pleasant middle-aged female lying comfortably propped up in bed seen eating breakfast this morning.  CVS: S1  S2 auscultated, no rubs, murmurs or gallops. Regular rate and rhythm.   Respiratory: coarse breath sounds right lower lung fields. Rest clear to auscultation. No wheezing or rhonchi. No increased work of breathing.  Abdomen: Soft, nontender, nondistended, + bowel sounds  Ext: no cyanosis clubbing or edema  Neuro: AAOx3, Cr N's II- XII. Strength 5/5 upper and lower extremities bilaterally. Alert and oriented  Psych: Normal affect and demeanor, alert and oriented x3    Data Review   Micro Results Recent Results (from the past 240 hour(s))  Blood  culture (routine x 2)     Status: None   Collection Time: 01/09/16  9:10 PM  Result Value Ref Range Status   Specimen Description BLOOD RIGHT ARM  Final   Special Requests BOTTLES DRAWN AEROBIC AND ANAEROBIC 5CC  Final   Culture NO GROWTH 5 DAYS  Final   Report Status 01/14/2016 FINAL  Final  Blood culture (routine x 2)     Status: None   Collection Time: 01/09/16  9:15 PM  Result Value Ref Range Status   Specimen Description BLOOD LEFT HAND  Final   Special Requests BOTTLES DRAWN AEROBIC AND ANAEROBIC 5CC  Final   Culture NO GROWTH 5 DAYS  Final   Report Status 01/14/2016 FINAL  Final  Culture, expectorated sputum-assessment     Status: None   Collection Time: 01/14/16 10:10 AM  Result Value Ref Range Status   Specimen Description SPUTUM  Final   Special Requests NONE  Final   Sputum evaluation   Final    THIS SPECIMEN IS ACCEPTABLE. RESPIRATORY CULTURE REPORT TO FOLLOW.   Report Status 01/14/2016 FINAL  Final    Radiology Reports Dg Chest 2 View  01/13/2016  CLINICAL DATA:  Shortness of breath for 4 days EXAM: CHEST  2 VIEW COMPARISON:  January 09, 2016 FINDINGS: There is persistent airspace consolidation consistent with pneumonia in the right middle and right lower lobe regions. No new opacity is identified. Left lung is clear. Heart size and pulmonary vascularity are normal. No adenopathy. No bone lesions. IMPRESSION: Persistent  pneumonia in the right middle and lower lobes. No new opacity. No change in cardiac silhouette. Electronically Signed   By: Lowella Grip III M.D.   On: 01/13/2016 09:28   Dg Chest 2 View  01/09/2016  CLINICAL DATA:  Cough, shortness of breath, dizziness, wheezing EXAM: CHEST  2 VIEW COMPARISON:  CT chest 01/09/2016 FINDINGS: There is right lower lobe and right middle lobe airspace disease without significant interval change compared with 12/12/2015. There is no pleural effusion or pneumothorax. The heart and mediastinal contours are unremarkable. The osseous structures are unremarkable. IMPRESSION: Lower lobe and right middle lobe airspace disease without significant interval change compared with 12/12/2015. Electronically Signed   By: Kathreen Devoid   On: 01/09/2016 20:22   Ct Chest Wo Contrast  01/09/2016  CLINICAL DATA:  65 year old female with prior history of pneumonia (hospitalized in December 2016), with worsening cough, congestion, wheezing and shortness of breath. EXAM: CT CHEST WITHOUT CONTRAST TECHNIQUE: Multidetector CT imaging of the chest was performed following the standard protocol without IV contrast. COMPARISON:  Multiple priors, most recently chest CT 11/03/2015. FINDINGS: Mediastinum/Lymph Nodes: Heart size is normal. There is no significant pericardial fluid, thickening or pericardial calcification. There is atherosclerosis of the thoracic aorta, the great vessels of the mediastinum and the coronary arteries, including calcified atherosclerotic plaque in the distal left main, left anterior descending and left circumflex coronary arteries. Prominent soft tissue in the right hilar region likely reflects underlying lymphoid enlargement (which cannot be measured accurately on today's noncontrast CT examination). Enlarged subcarinal lymph node measuring 13 mm in short axis. Enlarged low right paratracheal lymph node measuring up to 15 mm in short axis. High right paratracheal lymph node  measuring up to 15 mm in short axis. Esophagus is unremarkable in appearance. No axillary lymphadenopathy. Lungs/Pleura: Compared to the prior examination from 11/03/2015 the airspace consolidation in the right lower lobe and to a lesser extent in the right middle lobe has largely resolved.  There continues to be extensive areas in varicose and cystic bronchiectasis throughout the right lower and right middle lobes, where these bronchiectatic bronchi are largely filled with retained secretions. In the right lower lobe there also several cavitary areas, largest of which has thick walls and measures up to 3.3 x 2.2 cm (image 32 of series 3). Diffuse bronchial wall thickening. Patchy areas of more severe thickening of the peribronchovascular interstitium with some peribronchovascular ground-glass attenuation noted throughout the lungs bilaterally, most evident in the anterior aspects of the upper lobes of the lungs where there is also some very subtle peribronchovascular tree-in-bud micronodularity, which likely reflects areas of mucoid impaction within terminal bronchioles. Mild centrilobular and paraseptal emphysema. New ground-glass attenuation nodule in the medial aspect of the left upper lobe (image 7 of series 3) measuring 12 x 13 mm. No pleural effusions. Upper Abdomen: Unremarkable. Musculoskeletal/Soft Tissues: There are no aggressive appearing lytic or blastic lesions noted in the visualized portions of the skeleton. IMPRESSION: 1. Sequela of prior necrotizing pneumonia in the right lower and middle lobes redemonstrated with multiple cavitary areas and extensive varicose and cystic bronchiectasis, similar to prior examinations. Compared to the most recent prior study, the extent of associated airspace consolidation has significantly improved. Right hilar and mediastinal lymphadenopathy, similar to prior examinations, likely reactive. 2. Mild centrilobular and paraseptal emphysema; imaging findings suggestive  of underlying COPD. 3. Atherosclerosis, including left main and 2 vessel coronary artery disease. Please note that although the presence of coronary artery calcium documents the presence of coronary artery disease, the severity of this disease and any potential stenosis cannot be assessed on this non-gated CT examination. Assessment for potential risk factor modification, dietary therapy or pharmacologic therapy may be warranted, if clinically indicated. 4. Additional incidental findings, as above. Electronically Signed   By: Vinnie Langton M.D.   On: 01/09/2016 15:43    CBC  Recent Labs Lab 01/09/16 2045 01/10/16 0210 01/10/16 0638 01/11/16 0600 01/12/16 0248  WBC 9.8 5.7 4.4 7.5 7.2  HGB 12.6 11.9* 12.1 11.3* 11.3*  HCT 37.0 37.0 36.6 35.4* 36.0  PLT 269 257 224 229 242  MCV 84.7 85.3 84.7 87.4 88.0  MCH 28.8 27.4 28.0 27.9 27.6  MCHC 34.1 32.2 33.1 31.9 31.4  RDW 14.7 14.7 14.8 14.7 14.9  LYMPHSABS 1.0  --  0.9 1.1 2.6  MONOABS 0.2  --  0.0* 0.3 0.7  EOSABS 0.0  --  0.0 0.0 0.0  BASOSABS 0.0  --  0.0 0.0 0.0    Chemistries   Recent Labs Lab 01/09/16 1706 01/09/16 2045 01/10/16 0210 01/10/16 0638 01/11/16 0600  NA 124* 131*  --  132* 136  K 5.1 3.4*  --  3.6 3.9  CL 82* 88*  --  91* 97*  CO2 32* 31  --  30 32  GLUCOSE 108* 114*  --  164* 136*  BUN 6* <5*  --  6 9  CREATININE 0.52 0.47 0.56 0.42* 0.44  CALCIUM 8.9 8.9  --  8.6* 8.8*  AST 42*  --   --   --   --   ALT 15  --   --   --   --   ALKPHOS 109  --   --   --   --   BILITOT 0.6  --   --   --   --    ------------------------------------------------------------------------------------------------------------------ estimated creatinine clearance is 58 mL/min (by C-G formula based on Cr of 0.44). ------------------------------------------------------------------------------------------------------------------ No results for input(s):  HGBA1C in the last 72  hours. ------------------------------------------------------------------------------------------------------------------ No results for input(s): CHOL, HDL, LDLCALC, TRIG, CHOLHDL, LDLDIRECT in the last 72 hours. ------------------------------------------------------------------------------------------------------------------ No results for input(s): TSH, T4TOTAL, T3FREE, THYROIDAB in the last 72 hours.  Invalid input(s): FREET3 ------------------------------------------------------------------------------------------------------------------ No results for input(s): VITAMINB12, FOLATE, FERRITIN, TIBC, IRON, RETICCTPCT in the last 72 hours.  Coagulation profile No results for input(s): INR, PROTIME in the last 168 hours.  No results for input(s): DDIMER in the last 72 hours.  Cardiac Enzymes  Recent Labs Lab 01/09/16 2045  TROPONINI <0.03   ------------------------------------------------------------------------------------------------------------------ Invalid input(s): POCBNP  No results for input(s): GLUCAP in the last 72 hours.   Vernell Leep, MD, FACP, FHM. [facility] Pager [phone]  If 7PM-7AM, please contact night-coverage [email or web address] Password [facility] 01/14/2016, 4:31 PM

## 2016-01-14 NOTE — Progress Notes (Signed)
Name: Catherine Kelley MRN: [id number] DOB: Oct 01, 1951    ADMISSION DATE:  01/09/2016 CONSULTATION DATE: 01/13/16  REFERRING MD :  Algis Liming  CHIEF COMPLAINT:  Short of breath  STUDIES:  CT chest 2/8 >> Bronchiectasis and cavity RML, RLL. Much inspissated mucus PFT 1/18 >> showed an FEV1 of 1.45 L, which was 64% of predicted, ratio of 72%, no significant bronchodilator response, FVC at 70%, air trapping noted. Diffusing capacity decreased at 67% Swallow eval (MBS) 12/4 >> mild dysphagia, no obvious aspiration on this eval but confirms moderate aspiration risk. Dys II diet recommended  SUBJECTIVE: Feels better overall. She has cough productive of pale yellow sputum. Denies hemoptysis.  VITAL SIGNS: Temp:  [97.4 F (36.3 C)-98.5 F (36.9 C)] 97.4 F (36.3 C) (02/13 0427) Pulse Rate:  [78-86] 81 (02/13 0427) Resp:  [16-18] 16 (02/13 0427) BP: (122-132)/(64-69) 129/64 mmHg (02/13 0427) SpO2:  [93 %-95 %] 95 % (02/13 0739) FiO2 (%):  [21 %] 21 % (02/13 0739) Weight:  [121 lb 6.4 oz (55.067 kg)] 121 lb 6.4 oz (55.067 kg) (02/13 0427)  PHYSICAL EXAMINATION: General:  Chronically ill F lying in bed, watching TV, NAD, thin Neuro: oriented, non-focal HEENT:  Teeth stained, speech clear,no stridor, white plaques at back of throat Cardiovascular:  RRR, no mgr Lungs: rhonchi RLL, breathing comfortably on room air Abdomen:  Thin, soft, no HSM, BS+ Musculoskeletal:  No LE edema Skin:  No rash or bruising   Recent Labs Lab 01/09/16 2045 01/10/16 0210 01/10/16 0638 01/11/16 0600  NA 131*  --  132* 136  K 3.4*  --  3.6 3.9  CL 88*  --  91* 97*  CO2 31  --  30 32  BUN <5*  --  6 9  CREATININE 0.47 0.56 0.42* 0.44  GLUCOSE 114*  --  164* 136*    Recent Labs Lab 01/10/16 0638 01/11/16 0600 01/12/16 0248  HGB 12.1 11.3* 11.3*  HCT 36.6 35.4* 36.0  WBC 4.4 7.5 7.2  PLT 224 229 242   Dg Chest 2 View  01/13/2016  CLINICAL DATA:  Shortness of breath for 4 days EXAM: CHEST  2 VIEW  COMPARISON:  January 09, 2016 FINDINGS: There is persistent airspace consolidation consistent with pneumonia in the right middle and right lower lobe regions. No new opacity is identified. Left lung is clear. Heart size and pulmonary vascularity are normal. No adenopathy. No bone lesions. IMPRESSION: Persistent pneumonia in the right middle and lower lobes. No new opacity. No change in cardiac silhouette. Electronically Signed   By: Lowella Grip III M.D.   On: 01/13/2016 09:28   ASSESSMENT / PLAN: 65 year old female smoker with RLL/RMLbronchiectasis w cavity, followed at [facility]. Hospitalized early December after a fall. Swallow eval then only mild abnl. Cx sputum with pan sensitive pseudomonas. Presented with new wheezing and increased SOB. Transferred to [facility] for hypoxemia.  Acute hypoxemic respiratory failure: Resolved. O2 sat 93-95% on RA.  Bronchiectasis with exacerbation: -On aztreonam. -Obtain sputum culture. -She will need follow up with Dr. Chase Caller, repeat CT in 6-8 weeks as outpatient  COPD:  -Albuterol prn -Continue Dulera BID  Oral Candidiasis: -Nystatin swish and swallow  Jacques Earthly, MD  Internal Medicine PGY-2  Pulmonary and [place] Pager: [phone]  01/14/2016, 9:21 AM  Pt was seen and examined with Dr. Jacques Earthly. Agree with her A and P. Plan as written above. Cont abx -- anticipate deescalating in 1-2 days. Needs a f/u  chest ct scan.

## 2016-01-14 NOTE — Progress Notes (Signed)
SATURATION QUALIFICATIONS: (This note is used to comply with regulatory documentation for home oxygen)  Patient Saturations on Room Air at Rest = 92%  Patient Saturations on Room Air while Ambulating = 88-94% Patient Saturations on 2 Liters of oxygen while Ambulating 95%  Please briefly explain why patient needs home oxygen: desats on room air and recovered with 02 //m [place] pt feels short winded on ambulation coming to room

## 2016-01-15 ENCOUNTER — Other Ambulatory Visit: Payer: Self-pay | Admitting: Pulmonary Disease

## 2016-01-15 DIAGNOSIS — J449 Chronic obstructive pulmonary disease, unspecified: Secondary | ICD-10-CM

## 2016-01-15 DIAGNOSIS — J9601 Acute respiratory failure with hypoxia: Secondary | ICD-10-CM

## 2016-01-15 DIAGNOSIS — J471 Bronchiectasis with (acute) exacerbation: Secondary | ICD-10-CM

## 2016-01-15 MED ORDER — UNABLE TO FIND: Status: DC

## 2016-01-15 MED ORDER — LEVOFLOXACIN 500 MG PO TABS: 500.0000 mg | ORAL_TABLET | Freq: Every day | ORAL | Status: DC

## 2016-01-15 MED ORDER — PREDNISONE 10 MG PO TABS: 30.0000 mg | ORAL_TABLET | Freq: Every day | ORAL | Status: DC

## 2016-01-15 NOTE — Progress Notes (Signed)
Name: Catherine Kelley MRN: [id number] DOB: [birth year]    ADMISSION DATE:  01/09/2016  REFERRING MD :  Dr. Algis Liming  CHIEF COMPLAINT:  SOB  HISTORY OF PRESENT ILLNESS:   65 year old female smoker with RLL/RMLbronchiectasis w cavity, followed at [facility]. Hospitalized early December after a fall. Swallow eval then only mild abnl. Cx sputum with pan sensitive pseudomonas. Presented with new wheezing and increased SOB. Transferred to [facility] for hypoxemia. Pt is significantly better. Chest ct scan with B infiltrates, some cavitary lesions at the bases. Received 7 days of aztreonam, switched to levaquin on 2/13.  Clinically improved. Less cough and sob.   PAST MEDICAL HISTORY :   has a past medical history of Hypertension; Tobacco abuse; Anxiety; Depression; Emphysematous COPD ([place]); COPD (chronic obstructive pulmonary disease) ([place]); History of echocardiogram (12/24/2006); History of cardiovascular stress test (08/15/2004); Diastolic dysfunction; Allergy; Asthma; Heart murmur; Pneumonia; Chronic bronchitis ([place]); and GERD (gastroesophageal reflux disease).  has past surgical history that includes Tubal ligation (1986). Prior to Admission medications   Medication Sig Start Date End Date Taking? Authorizing Provider  ALPRAZolam Duanne Moron) 0.5 MG tablet Take 0.5 mg by mouth at bedtime.    Yes Historical Provider, MD  clotrimazole (GYNE-LOTRIMIN 3) 2 % vaginal cream Place 1 Applicatorful vaginally at bedtime. Patient taking differently: Place 1 Applicatorful vaginally at bedtime as needed (itching).  11/08/15  Yes Reyne Dumas, MD  divalproex (DEPAKOTE ER) 500 MG 24 hr tablet Take 500 mg by mouth at bedtime.  02/22/12  Yes Historical Provider, MD  furosemide (LASIX) 20 MG tablet TAKE 2 TABLETS BY MOUTH EVERY DAY Patient taking differently: Take 20 mg by mouth daily 05/25/15  Yes [name], MD  guaiFENesin (MUCINEX) 600 MG 12 hr tablet Take 1 tablet (600 mg total) by mouth 2 (two) times  daily. 11/08/15  Yes Reyne Dumas, MD  HYDROcodone-acetaminophen (NORCO/VICODIN) 5-325 MG tablet Take 1 tablet by mouth every 6 (six) hours as needed for moderate pain. Patient taking differently: Take 0.5-1 tablets by mouth every 6 (six) hours as needed for moderate pain.  12/12/15  Yes Wardell Honour, MD  ipratropium (ATROVENT) 0.03 % nasal spray Place 2 sprays into both nostrils 2 (two) times daily. Reported on 12/19/2015 07/16/15  Yes Historical Provider, MD  mometasone-formoterol (DULERA) 100-5 MCG/ACT AERO Take 2 puffs first thing in am and then another 2 puffs about 12 hours later. 09/27/15  Yes Tanda Rockers, MD  nadolol (CORGARD) 40 MG tablet Take 20 mg by mouth daily.  09/28/15  Yes Historical Provider, MD  nortriptyline (PAMELOR) 50 MG capsule Take 50 mg by mouth at bedtime. 11/12/15  Yes Historical Provider, MD  tiZANidine (ZANAFLEX) 4 MG tablet Take 1 tablet (4 mg total) by mouth every 8 (eight) hours as needed for muscle spasms. 12/12/15  Yes Wardell Honour, MD  albuterol (PROVENTIL) (2.5 MG/3ML) 0.083% nebulizer solution Take 3 mLs (2.5 mg total) by nebulization every 2 (two) hours as needed for wheezing. Patient not taking: Reported on 12/19/2015 11/08/15   Reyne Dumas, MD  benzonatate (TESSALON) 100 MG capsule TAKE 1 TO 2 CAPSULES BY MOUTH 3 TIMES DAILY AS NEEDED FOR COUGH Patient not taking: Reported on 01/09/2016 10/03/15   Wardell Honour, MD  cefdinir (OMNICEF) 300 MG capsule Take 2 capsules (600 mg total) by mouth daily. Patient not taking: Reported on 01/09/2016 12/12/15   Wardell Honour, MD  feeding supplement, ENSURE ENLIVE, (ENSURE ENLIVE) LIQD Take 237 mLs by mouth 2 (two) times  daily between meals. Patient not taking: Reported on 01/09/2016 11/08/15   Reyne Dumas, MD  fluticasone (FLONASE) 50 MCG/ACT nasal spray PLACE 2 SPRAYS INTO BOTH NOSTRILS DAILY. Patient not taking: Reported on 01/09/2016 12/31/15   Brand Males, MD  levofloxacin (LEVAQUIN) 500 MG tablet Take 1 tablet (500 mg  total) by mouth daily. For 2days 01/15/16   Domenic Polite, MD  nortriptyline (PAMELOR) 25 MG capsule Take 1 capsule (25 mg total) by mouth at bedtime. Patient not taking: Reported on 01/09/2016 11/08/15   Reyne Dumas, MD  potassium chloride (KLOR-CON) 20 MEQ packet Take 40 mEq by mouth daily. Patient not taking: Reported on 01/09/2016 11/08/15   Reyne Dumas, MD  predniSONE (DELTASONE) 10 MG tablet Take 3 tablets (30 mg total) by mouth daily with breakfast. Take '30mg'$  for 1days then '20mg'$  for 2days then '10mg'$  for 2days then STOP 01/15/16   Domenic Polite, MD  saccharomyces boulardii (FLORASTOR) 250 MG capsule Take 1 capsule (250 mg total) by mouth 2 (two) times daily. Patient not taking: Reported on 01/09/2016 08/21/15   Charlynne Cousins, MD  UNABLE TO FIND This note is to excuse Ms.Kemonie Sermeno from work due to medical illness requiring Hospitalization from 2/8-2/14, ok to return to work on 2/20 01/15/16   Domenic Polite, MD   Allergies  Allergen Reactions  . Ceclor [Cefaclor] Hives  . Escitalopram Oxalate     Pt does not recall ever taking medication  . Sertraline Hcl     Severe Headache  . Sulfa Drugs Cross Reactors Hives and Rash    Hives and rash    FAMILY HISTORY:  family history includes Asthma in her maternal grandmother; Breast cancer in her maternal aunt; Heart disease in her father; Hyperlipidemia in her father; Hypertension in her father; Stroke in her mother. SOCIAL HISTORY:  reports that she has been smoking Cigarettes.  She has a 42 pack-year smoking history. She has never used smokeless tobacco. She reports that she drinks alcohol. She reports that she does not use illicit drugs.  REVIEW OF SYSTEMS:   Constitutional: Negative for fever, chills, weight loss, malaise/fatigue and diaphoresis.  HENT: Negative for hearing loss, ear pain, nosebleeds, congestion, sore throat, neck pain, tinnitus and ear discharge.   Eyes: Negative for blurred vision, double vision, photophobia, pain,  discharge and redness.  Respiratory: (+) cough and SOB but better.  Cardiovascular: Negative for chest pain, palpitations, orthopnea, claudication, leg swelling and PND.  Gastrointestinal: Negative for heartburn, nausea, vomiting, abdominal pain, diarrhea, constipation, blood in stool and melena.  Genitourinary: Negative for dysuria, urgency, frequency, hematuria and flank pain.  Musculoskeletal: Negative for myalgias, back pain, joint pain and falls.  Skin: Negative for itching and rash.  Neurological: Negative for dizziness, tingling, tremors, sensory change, speech change, focal weakness, seizures, loss of consciousness, weakness and headaches.  Endo/Heme/Allergies: Negative for environmental allergies and polydipsia. Does not bruise/bleed easily.  SUBJECTIVE:   VITAL SIGNS: Temp:  [97.2 F (36.2 C)-97.4 F (36.3 C)] 97.4 F (36.3 C) (02/14 0623) Pulse Rate:  [81-102] 81 (02/14 0623) Resp:  [18-20] 18 (02/14 0623) BP: (116-117)/(45-59) 117/45 mmHg (02/14 0623) SpO2:  [94 %-98 %] 98 % (02/14 0623) FiO2 (%):  [21 %] 21 % (02/13 1415) Weight:  [123 lb (55.792 kg)] 123 lb (55.792 kg) (02/14 0623)  PHYSICAL EXAMINATION:  Vitals:  Filed Vitals:   01/14/16 1415 01/14/16 1926 01/14/16 2201 01/15/16 0623  BP:  116/59  117/45  Pulse:  102 100 81  Temp:  97.2 F (  36.2 C)  97.4 F (36.3 C)  TempSrc:  Oral  Oral  Resp:  '18 20 18  '$ Height:      Weight:    123 lb (55.792 kg)  SpO2: 94% 94% 98% 98%    Constitutional/General:  Pleasant, well-nourished, well-developed, not in any distress,  Comfortably seating.  Well kempt  Body mass index is 21.79 kg/(m^2). Wt Readings from Last 3 Encounters:  01/15/16 123 lb (55.792 kg)  01/09/16 129 lb 12.8 oz (58.877 kg)  12/19/15 132 lb (59.875 kg)    Neck circumference:   HEENT: Pupils equal and reactive to light and accommodation. Anicteric sclerae. Normal nasal mucosa.   No oral  lesions,  mouth clear,  oropharynx clear, no postnasal  drip. (-) Oral thrush. No dental caries.  Airway - Mallampati class III  Neck: No masses. Midline trachea. No JVD, (-) LAD. (-) bruits appreciated.  Respiratory/Chest: Grossly normal chest. (-) deformity. (-) Accessory muscle use.  Symmetric expansion. (-) Tenderness on palpation.  Resonant on percussion.  Diminished BS on both lower lung zones. (-) egophony Bibasilar crackles. Some wheezing.   Cardiovascular: Regular rate and  rhythm, heart sounds normal, no murmur or gallops, no peripheral edema  Gastrointestinal:  Normal bowel sounds. Soft, non-tender. No hepatosplenomegaly.  (-) masses.   Musculoskeletal:  Normal muscle tone. Normal gait.   Extremities: Grossly normal. (-) clubbing, cyanosis.  (-) edema  Skin: (-) rash,lesions seen.   Neurological/Psychiatric : alert, oriented to time, place, person. Normal mood and affect    Recent Labs Lab 01/09/16 2045 01/10/16 0210 01/10/16 0638 01/11/16 0600  NA 131*  --  132* 136  K 3.4*  --  3.6 3.9  CL 88*  --  91* 97*  CO2 31  --  30 32  BUN <5*  --  6 9  CREATININE 0.47 0.56 0.42* 0.44  GLUCOSE 114*  --  164* 136*    Recent Labs Lab 01/10/16 0638 01/11/16 0600 01/12/16 0248  HGB 12.1 11.3* 11.3*  HCT 36.6 35.4* 36.0  WBC 4.4 7.5 7.2  PLT 224 229 242   No results found.  ASSESSMENT / PLAN:  66 year old female smoker with RLL/RMLbronchiectasis w cavity, followed at [facility]. Hospitalized early December after a fall. Swallow eval then only mild abnl. Cx sputum with pan sensitive pseudomonas. Presented with new wheezing and increased SOB. Transferred to [facility] for hypoxemia. Chest ct scan with B infiltrates, some cavitary lesions at the bases. Previously had infiltrates as well -- some better. Pt with h/o PSA in sputum. Plan: 1. Await sputum culture. Need to f/u as out pt. 2. Received 1 week of aztreonam. Suggest 1 more week with levaquin. Adjust abx based on culture. 3. Pt needs a chest ct scan  in 4-6 weeks and needs f/u with pulmonary Dr. Chase Caller in 4-6 weeks.  4. Cont other meds. Pt on dulera.  5. Cont nystatin for oral thrush.  6. dvt prophylaxis.    Elsie Saas A. Corrie Dandy, MD Pulmonary and [place] Pager : [phone] After 3 pm or if no response, call [phone]  01/15/2016, 11:47 AM

## 2016-01-15 NOTE — Discharge Summary (Signed)
Physician Discharge Summary  Catherine Kelley HWE:[id number] DOB: 05-04-1951 DOA: 01/09/2016  PCP: [name], MD  Admit date: 01/09/2016 Discharge date: 01/15/2016  Time spent:35 minutes  Recommendations for Outpatient Follow-up:  1. Dr.Ramaswamy on 2/23, Repeat CT chest in 4-6weeks 2. Please FU Sputum Cx   Discharge Diagnoses:  Principal Problem:   HCAP (healthcare-associated pneumonia) Active Problems:   Depression   Hyponatremia   COPD GOLD II/ still smoking   COPD, moderate ([place])   Bronchiectasis with acute exacerbation ([place])   Essential hypertension   Malnutrition of moderate degree   Respiratory failure with hypoxia [facility])   Discharge Condition:stable  Diet recommendation: heart healthy  Filed Weights   01/12/16 0542 01/14/16 0427 01/15/16 0623  Weight: 57.788 kg (127 lb 6.4 oz) 55.067 kg (121 lb 6.4 oz) 55.792 kg (123 lb)    History of present illness:  Chief Complaint: Shortness of breath. HPI: Catherine Kelley is a 65 y.o. female with a past medical history of hypertension, COPD, anxiety, depression, tobacco abuse disorder who comes from the urgent care center due to fever, progressively worse dyspnea, productive cough and wheezing after treatment there with bronchodilators and corticosteroids did not relieve her hypoxia so she was sent to the emergency department.  Per patient, since several days ago she has been having progressively worse dyspnea, with wheezing, cough with increased yellowish mucus production, fever, chills and fatigue  Hospital Course:  65 year old female smoker with RLL/RMLbronchiectasis w cavity, followed at [facility]. Hospitalized early December after a fall. Swallow eval then only mild abnl. Cx sputum with pan sensitive pseudomonas. Presented with new wheezing and increased SOB  HCAP (healthcare-associated pneumonia)/COPD exacerbation/acute hypoxic respiratory failure with underlying history of bronchiectasis (followed by Dr. Chase Caller,  Pulmonology) - Treated with scheduled DuoNeb nebs, IV Solu-Medrol, IV vancomycin and aztreonam (on IV antibiotics since 2/8), flutter valve, Mucinex  - Sputum culture unfortunately was not sent initially, she has h/o pos Pseudomonas. Urine streptococcal antigen: Negative. Urine Legionella antigen: Negative.  - 2-D echo completed and was Unremarkable and does not suggest cor pulmonale. - Blood cultures 2: Negative to date. Stopped vancomycin 2/10. Sputum culture from 11/02/15 had shown Pseudomonas. Changed IV antibiotics to oral levofloxacin on 2/13 - Changed IV steroids to oral prednisone, due to peristence of Symptoms seen by Pulm this admission, Chest vest added, recommended levaquin at discharge for 27moe days and FU with CT chest in 4-6weeks  - do not require O2 at discharge    Hyponatremia: Patient presented with sodium of 124. Unclear etiology but may have been due to dehydration. Serum and urine osmolarity normal. Hyponatremia resolved. DC IV fluids.   Essential hypertension - Currently stable/controlled.  Anxiety/depression - Continue Xanax - Continue Depakote, nortriptyline  Mild hypokalemia - Replaced  Tobacco disorder Counseled on the nicotine cessation,  nicotine patch used  Anemia - Periodically follow CBCs as outpatient.  Consultations: Pulm [place] Discharge Exam: Filed Vitals:   01/15/16 0623 01/15/16 1237  BP: 117/45 120/48  Pulse: 81 83  Temp: 97.4 F (36.3 C) 97.6 F (36.4 C)  Resp: 18 18    General: AAOx3 Cardiovascular: s1s2/RRR Respiratory: CTAB  Discharge Instructions   Discharge Instructions    Diet - low sodium heart healthy    Complete by:  As directed      Increase activity slowly    Complete by:  As directed           Current Discharge Medication List    START taking these medications  Details  levofloxacin (LEVAQUIN) 500 MG tablet Take 1 tablet (500 mg total) by mouth daily. For 7days Qty: 7 tablet, Refills: 0     predniSONE (DELTASONE) 10 MG tablet Take 3 tablets (30 mg total) by mouth daily with breakfast. Take '30mg'$  for 1days then '20mg'$  for 2days then '10mg'$  for 2days then STOP Qty: 10 tablet, Refills: 0    UNABLE TO FIND This note is to excuse Ms.Catherine Kelley from work due to medical illness requiring Hospitalization from 2/8-2/14, ok to return to work on 2/20 Qty: 1 each, Refills: 0      CONTINUE these medications which have NOT CHANGED   Details  ALPRAZolam (XANAX) 0.5 MG tablet Take 0.5 mg by mouth at bedtime.     divalproex (DEPAKOTE ER) 500 MG 24 hr tablet Take 500 mg by mouth at bedtime.     furosemide (LASIX) 20 MG tablet TAKE 2 TABLETS BY MOUTH EVERY DAY Qty: 180 tablet, Refills: 1    guaiFENesin (MUCINEX) 600 MG 12 hr tablet Take 1 tablet (600 mg total) by mouth 2 (two) times daily. Qty: 60 tablet, Refills: 0    HYDROcodone-acetaminophen (NORCO/VICODIN) 5-325 MG tablet Take 1 tablet by mouth every 6 (six) hours as needed for moderate pain. Qty: 40 tablet, Refills: 0    ipratropium (ATROVENT) 0.03 % nasal spray Place 2 sprays into both nostrils 2 (two) times daily. Reported on 12/19/2015 Refills: 11    mometasone-formoterol (DULERA) 100-5 MCG/ACT AERO Take 2 puffs first thing in am and then another 2 puffs about 12 hours later.    nadolol (CORGARD) 40 MG tablet Take 20 mg by mouth daily.     nortriptyline (PAMELOR) 50 MG capsule Take 50 mg by mouth at bedtime. Refills: 0    tiZANidine (ZANAFLEX) 4 MG tablet Take 1 tablet (4 mg total) by mouth every 8 (eight) hours as needed for muscle spasms. Qty: 45 tablet, Refills: 0   Associated Diagnoses: Lumbar back pain with radiculopathy affecting right lower extremity    albuterol (PROVENTIL) (2.5 MG/3ML) 0.083% nebulizer solution Take 3 mLs (2.5 mg total) by nebulization every 2 (two) hours as needed for wheezing. Qty: 75 mL, Refills: 12    benzonatate (TESSALON) 100 MG capsule TAKE 1 TO 2 CAPSULES BY MOUTH 3 TIMES DAILY AS NEEDED FOR  COUGH Qty: 60 capsule, Refills: 2      STOP taking these medications     clotrimazole (GYNE-LOTRIMIN 3) 2 % vaginal cream      cefdinir (OMNICEF) 300 MG capsule      feeding supplement, ENSURE ENLIVE, (ENSURE ENLIVE) LIQD      fluticasone (FLONASE) 50 MCG/ACT nasal spray      potassium chloride (KLOR-CON) 20 MEQ packet      saccharomyces boulardii (FLORASTOR) 250 MG capsule        Allergies  Allergen Reactions  . Ceclor [Cefaclor] Hives  . Escitalopram Oxalate     Pt does not recall ever taking medication  . Sertraline Hcl     Severe Headache  . Sulfa Drugs Cross Reactors Hives and Rash    Hives and rash   Follow-up Information    Follow up with [facility], MD. Go on 01/24/2016.   Specialty:  Pulmonary Disease   Why:  '@3'$ :00pm with Tammy Parret   Contact information:   [address] [place] [address] [phone]        The results of significant diagnostics from this hospitalization (including imaging, microbiology, ancillary and laboratory) are listed below for  reference.    Significant Diagnostic Studies: Dg Chest 2 View  01/13/2016  CLINICAL DATA:  Shortness of breath for 4 days EXAM: CHEST  2 VIEW COMPARISON:  January 09, 2016 FINDINGS: There is persistent airspace consolidation consistent with pneumonia in the right middle and right lower lobe regions. No new opacity is identified. Left lung is clear. Heart size and pulmonary vascularity are normal. No adenopathy. No bone lesions. IMPRESSION: Persistent pneumonia in the right middle and lower lobes. No new opacity. No change in cardiac silhouette. Electronically Signed   By: Lowella Grip III M.D.   On: 01/13/2016 09:28   Dg Chest 2 View  01/09/2016  CLINICAL DATA:  Cough, shortness of breath, dizziness, wheezing EXAM: CHEST  2 VIEW COMPARISON:  CT chest 01/09/2016 FINDINGS: There is right lower lobe and right middle lobe airspace disease without significant interval change compared with 12/12/2015.  There is no pleural effusion or pneumothorax. The heart and mediastinal contours are unremarkable. The osseous structures are unremarkable. IMPRESSION: Lower lobe and right middle lobe airspace disease without significant interval change compared with 12/12/2015. Electronically Signed   By: Kathreen Devoid   On: 01/09/2016 20:22   Ct Chest Wo Contrast  01/09/2016  CLINICAL DATA:  65 year old female with prior history of pneumonia (hospitalized in December 2016), with worsening cough, congestion, wheezing and shortness of breath. EXAM: CT CHEST WITHOUT CONTRAST TECHNIQUE: Multidetector CT imaging of the chest was performed following the standard protocol without IV contrast. COMPARISON:  Multiple priors, most recently chest CT 11/03/2015. FINDINGS: Mediastinum/Lymph Nodes: Heart size is normal. There is no significant pericardial fluid, thickening or pericardial calcification. There is atherosclerosis of the thoracic aorta, the great vessels of the mediastinum and the coronary arteries, including calcified atherosclerotic plaque in the distal left main, left anterior descending and left circumflex coronary arteries. Prominent soft tissue in the right hilar region likely reflects underlying lymphoid enlargement (which cannot be measured accurately on today's noncontrast CT examination). Enlarged subcarinal lymph node measuring 13 mm in short axis. Enlarged low right paratracheal lymph node measuring up to 15 mm in short axis. High right paratracheal lymph node measuring up to 15 mm in short axis. Esophagus is unremarkable in appearance. No axillary lymphadenopathy. Lungs/Pleura: Compared to the prior examination from 11/03/2015 the airspace consolidation in the right lower lobe and to a lesser extent in the right middle lobe has largely resolved. There continues to be extensive areas in varicose and cystic bronchiectasis throughout the right lower and right middle lobes, where these bronchiectatic bronchi are largely  filled with retained secretions. In the right lower lobe there also several cavitary areas, largest of which has thick walls and measures up to 3.3 x 2.2 cm (image 32 of series 3). Diffuse bronchial wall thickening. Patchy areas of more severe thickening of the peribronchovascular interstitium with some peribronchovascular ground-glass attenuation noted throughout the lungs bilaterally, most evident in the anterior aspects of the upper lobes of the lungs where there is also some very subtle peribronchovascular tree-in-bud micronodularity, which likely reflects areas of mucoid impaction within terminal bronchioles. Mild centrilobular and paraseptal emphysema. New ground-glass attenuation nodule in the medial aspect of the left upper lobe (image 7 of series 3) measuring 12 x 13 mm. No pleural effusions. Upper Abdomen: Unremarkable. Musculoskeletal/Soft Tissues: There are no aggressive appearing lytic or blastic lesions noted in the visualized portions of the skeleton. IMPRESSION: 1. Sequela of prior necrotizing pneumonia in the right lower and middle lobes redemonstrated with multiple cavitary areas and  extensive varicose and cystic bronchiectasis, similar to prior examinations. Compared to the most recent prior study, the extent of associated airspace consolidation has significantly improved. Right hilar and mediastinal lymphadenopathy, similar to prior examinations, likely reactive. 2. Mild centrilobular and paraseptal emphysema; imaging findings suggestive of underlying COPD. 3. Atherosclerosis, including left main and 2 vessel coronary artery disease. Please note that although the presence of coronary artery calcium documents the presence of coronary artery disease, the severity of this disease and any potential stenosis cannot be assessed on this non-gated CT examination. Assessment for potential risk factor modification, dietary therapy or pharmacologic therapy may be warranted, if clinically indicated. 4.  Additional incidental findings, as above. Electronically Signed   By: Vinnie Langton M.D.   On: 01/09/2016 15:43    Microbiology: Recent Results (from the past 240 hour(s))  Blood culture (routine x 2)     Status: None   Collection Time: 01/09/16  9:10 PM  Result Value Ref Range Status   Specimen Description BLOOD RIGHT ARM  Final   Special Requests BOTTLES DRAWN AEROBIC AND ANAEROBIC 5CC  Final   Culture NO GROWTH 5 DAYS  Final   Report Status 01/14/2016 FINAL  Final  Blood culture (routine x 2)     Status: None   Collection Time: 01/09/16  9:15 PM  Result Value Ref Range Status   Specimen Description BLOOD LEFT HAND  Final   Special Requests BOTTLES DRAWN AEROBIC AND ANAEROBIC 5CC  Final   Culture NO GROWTH 5 DAYS  Final   Report Status 01/14/2016 FINAL  Final  Culture, expectorated sputum-assessment     Status: None   Collection Time: 01/14/16 10:10 AM  Result Value Ref Range Status   Specimen Description SPUTUM  Final   Special Requests NONE  Final   Sputum evaluation   Final    THIS SPECIMEN IS ACCEPTABLE. RESPIRATORY CULTURE REPORT TO FOLLOW.   Report Status 01/14/2016 FINAL  Final  Culture, respiratory (NON-Expectorated)     Status: None (Preliminary result)   Collection Time: 01/14/16 10:10 AM  Result Value Ref Range Status   Specimen Description SPUTUM  Final   Special Requests NONE  Final   Gram Stain   Final    ABUNDANT WBC PRESENT, PREDOMINANTLY PMN RARE SQUAMOUS EPITHELIAL CELLS PRESENT ABUNDANT GRAM POSITIVE COCCI IN PAIRS IN CHAINS Performed at [facility]    Culture PENDING  Incomplete   Report Status PENDING  Incomplete     Labs: Basic Metabolic Panel:  Recent Labs Lab 01/09/16 1706 01/09/16 2045 01/10/16 0210 01/10/16 0638 01/11/16 0600  NA 124* 131*  --  132* 136  K 5.1 3.4*  --  3.6 3.9  CL 82* 88*  --  91* 97*  CO2 32* 31  --  30 32  GLUCOSE 108* 114*  --  164* 136*  BUN 6* <5*  --  6 9  CREATININE 0.52 0.47 0.56 0.42* 0.44   CALCIUM 8.9 8.9  --  8.6* 8.8*   Liver Function Tests:  Recent Labs Lab 01/09/16 1706  AST 42*  ALT 15  ALKPHOS 109  BILITOT 0.6  PROT 8.6*  ALBUMIN 3.0*   No results for input(s): LIPASE, AMYLASE in the last 168 hours. No results for input(s): AMMONIA in the last 168 hours. CBC:  Recent Labs Lab 01/09/16 2045 01/10/16 0210 01/10/16 0638 01/11/16 0600 01/12/16 0248  WBC 9.8 5.7 4.4 7.5 7.2  NEUTROABS 8.6*  --  3.4 6.0 4.0  HGB 12.6 11.9*  12.1 11.3* 11.3*  HCT 37.0 37.0 36.6 35.4* 36.0  MCV 84.7 85.3 84.7 87.4 88.0  PLT 269 257 224 229 242   Cardiac Enzymes:  Recent Labs Lab 01/09/16 2045  TROPONINI <0.03   BNP: BNP (last 3 results) No results for input(s): BNP in the last 8760 hours.  ProBNP (last 3 results) No results for input(s): PROBNP in the last 8760 hours.  CBG: No results for input(s): GLUCAP in the last 168 hours.     SignedDomenic Polite MD.  [facility] 01/15/2016, 2:57 PM

## 2016-01-15 NOTE — Progress Notes (Signed)
Quick Note:  LVM for pt to return call ______ 

## 2016-01-16 ENCOUNTER — Ambulatory Visit: Payer: Self-pay | Admitting: Adult Health

## 2016-01-17 ENCOUNTER — Ambulatory Visit (INDEPENDENT_AMBULATORY_CARE_PROVIDER_SITE_OTHER): Payer: Managed Care, Other (non HMO) | Admitting: Family Medicine

## 2016-01-17 VITALS — BP 120/70 | HR 95 | Temp 98.1°F | Resp 17 | Ht 62.0 in | Wt 125.1 lb

## 2016-01-17 DIAGNOSIS — Z09 Encounter for follow-up examination after completed treatment for conditions other than malignant neoplasm: Secondary | ICD-10-CM

## 2016-01-17 DIAGNOSIS — R0602 Shortness of breath: Secondary | ICD-10-CM

## 2016-01-17 DIAGNOSIS — J189 Pneumonia, unspecified organism: Secondary | ICD-10-CM

## 2016-01-17 DIAGNOSIS — J449 Chronic obstructive pulmonary disease, unspecified: Secondary | ICD-10-CM

## 2016-01-17 DIAGNOSIS — J181 Lobar pneumonia, unspecified organism: Secondary | ICD-10-CM

## 2016-01-17 LAB — CULTURE, RESPIRATORY

## 2016-01-17 LAB — CULTURE, RESPIRATORY W GRAM STAIN: Culture: NORMAL

## 2016-01-17 MED ORDER — IPRATROPIUM BROMIDE 0.02 % IN SOLN: 0.5000 mg | Freq: Four times a day (QID) | RESPIRATORY_TRACT | Status: DC | PRN

## 2016-01-17 MED ORDER — ALBUTEROL SULFATE (2.5 MG/3ML) 0.083% IN NEBU: 2.5000 mg | INHALATION_SOLUTION | Freq: Four times a day (QID) | RESPIRATORY_TRACT | Status: DC | PRN

## 2016-01-17 MED ORDER — BENZONATATE 100 MG PO CAPS: ORAL_CAPSULE | ORAL | Status: DC

## 2016-01-17 NOTE — Progress Notes (Signed)
Chief Complaint:  Chief Complaint  Patient presents with  . Hospitalization Follow-up    Was treated for pnuemonia    HPI: Catherine Kelley is a 65 y.o. female who reports to [facility] today complaining of Health Kelley aquired Pneumonia She is doing better with her meds on Levaquin   Discharge Diagnoses:  Principal Problem:  HCAP (healthcare-associated pneumonia) Active Problems:  Depression  Hyponatremia  COPD GOLD II/ still smoking  COPD, moderate (HCC)  Bronchiectasis with acute exacerbation ([place])  Essential hypertension  Malnutrition of moderate degree  Respiratory failure with hypoxia ([place])  Past Medical History  Diagnosis Date  . Hypertension     essential hypertension  . Tobacco abuse     smoking about 1/2 pk of cigarettes a day  . Anxiety   . Depression     psychiatrist Dr. Toy Kelley  . Emphysematous COPD ([place])     changes in right base along with patchy areas on last x-ray  . COPD (chronic obstructive pulmonary disease) ([place])   . History of echocardiogram 12/24/2006    Est. EF of 54-65% NORMAL LV SYSTOLIC FUNCTION WITH IMPAIRED RELAXATION -- MILD AORTIC SCLEROSIS -- NORMAL PALONARY ARTERY PRESSURE -- NO OLD ECHOS FOR COMPARISON -- Catherine Coco, MD  . History of cardiovascular stress test 08/15/2004    EF of 70% -- Normal stress cardiolite.  There is no evidence of ischemia and there is normal LV function. -- Catherine Kelley. MD  . Diastolic dysfunction   . Allergy   . Asthma   . Heart murmur   . Pneumonia     "several times since May 2015" (07/06/2014)  . Chronic bronchitis ([place])     "get it alot; maybe not q yr" (07/06/2014)  . GERD (gastroesophageal reflux disease)    Past Surgical History  Procedure Laterality Date  . Tubal ligation  1986   Social History   Social History  . Marital Status: Married    Spouse Name: N/A  . Number of Children: N/A  . Years of Education: N/A   Occupational History  . SALES ASSOCIATE     [employer]   Social  History Main Topics  . Smoking status: Current Every Day Smoker -- 1.00 packs/day for 42 years    Types: Cigarettes    Last Attempt to Quit: 10/18/2015  . Smokeless tobacco: Never Used  . Alcohol Use: 0.0 oz/week    0 Standard drinks or equivalent per week     Comment: sicial  . Drug Use: No  . Sexual Activity: No   Other Topics Concern  . None   Social History Narrative   Patient currently lives with her husband. She works in Scientist, research (medical). They do have a dog. No bird or hot tub exposure. No mold exposure. No recent travel.   Family History  Problem Relation Age of Onset  . Stroke Mother   . Heart disease Father   . Hyperlipidemia Father   . Hypertension Father   . Breast cancer Maternal Aunt   . Asthma Maternal Grandmother    Allergies  Allergen Reactions  . Ceclor [Cefaclor] Hives  . Escitalopram Oxalate     Pt does not recall ever taking medication  . Sertraline Hcl     Severe Headache  . Sulfa Drugs Cross Reactors Hives and Rash    Hives and rash   Prior to Admission medications   Medication Sig Start Date End Date Taking? Authorizing Provider  ALPRAZolam Catherine Kelley) 0.5 MG tablet Take  0.5 mg by mouth at bedtime.    Yes Historical Provider, MD  divalproex (DEPAKOTE ER) 500 MG 24 hr tablet Take 500 mg by mouth at bedtime.  02/22/12  Yes Historical Provider, MD  furosemide (LASIX) 20 MG tablet TAKE 2 TABLETS BY MOUTH EVERY DAY Patient taking differently: Take 20 mg by mouth daily 05/25/15  Yes Catherine Coco, MD  guaiFENesin (MUCINEX) 600 MG 12 hr tablet Take 1 tablet (600 mg total) by mouth 2 (two) times daily. 11/08/15  Yes Catherine Dumas, MD  HYDROcodone-acetaminophen (NORCO/VICODIN) 5-325 MG tablet Take 1 tablet by mouth every 6 (six) hours as needed for moderate pain. Patient taking differently: Take 0.5-1 tablets by mouth every 6 (six) hours as needed for moderate pain.  12/12/15  Yes Catherine Honour, MD  ipratropium (ATROVENT) 0.03 % nasal spray Place 2 sprays into both  nostrils 2 (two) times daily. Reported on 12/19/2015 07/16/15  Yes Historical Provider, MD  levofloxacin (LEVAQUIN) 500 MG tablet Take 1 tablet (500 mg total) by mouth daily. For 7days 01/15/16  Yes Catherine Polite, MD  nadolol (CORGARD) 40 MG tablet Take 20 mg by mouth daily.  09/28/15  Yes Historical Provider, MD  nortriptyline (PAMELOR) 50 MG capsule Take 50 mg by mouth at bedtime. 11/12/15  Yes Historical Provider, MD  predniSONE (DELTASONE) 10 MG tablet Take 3 tablets (30 mg total) by mouth daily with breakfast. Take '30mg'$  for 1days then '20mg'$  for 2days then '10mg'$  for 2days then STOP 01/15/16  Yes Catherine Polite, MD  tiZANidine (ZANAFLEX) 4 MG tablet Take 1 tablet (4 mg total) by mouth every 8 (eight) hours as needed for muscle spasms. 12/12/15  Yes Catherine Honour, MD  UNABLE TO FIND This note is to excuse Catherine Kelley from work due to medical illness requiring Hospitalization from 2/8-2/14, ok to return to work on 2/20 01/15/16  Yes Catherine Polite, MD  albuterol (PROVENTIL) (2.5 MG/3ML) 0.083% nebulizer solution Take 3 mLs (2.5 mg total) by nebulization every 2 (two) hours as needed for wheezing. Patient not taking: Reported on 12/19/2015 11/08/15   Catherine Dumas, MD  benzonatate (TESSALON) 100 MG capsule TAKE 1 TO 2 CAPSULES BY MOUTH 3 TIMES DAILY AS NEEDED FOR COUGH Patient not taking: Reported on 01/09/2016 10/03/15   Catherine Honour, MD  mometasone-formoterol [facility]) 100-5 MCG/ACT AERO Take 2 puffs first thing in am and then another 2 puffs about 12 hours later. Patient not taking: Reported on 01/17/2016 09/27/15   Catherine Rockers, MD     ROS: The patient denies fevers, chills, night sweats, unintentional weight loss, chest pain, palpitations, nausea, vomiting, abdominal pain, dysuria, hematuria, melena, numbness,  or tingling.  All other systems have been reviewed and were otherwise negative with the exception of those mentioned in the HPI and as above.    PHYSICAL EXAM: Filed Vitals:   01/17/16 1809    BP: 120/70  Pulse: 95  Temp: 98.1 F (36.7 C)  Resp: 17   SpO2 Readings from Last 3 Encounters:     01/17/16 92%  01/15/16 97%    Body mass index is 22.88 kg/(m^2).   General: Alert, no acute distress HEENT:  Normocephalic, atraumatic, oropharynx patent. EOMI, PERRLA Cardiovascular:  Regular rate and rhythm, no rubs murmurs or gallops.  No Carotid bruits, radial pulse intact. No pedal edema.  Respiratory:   + rhonchi  No cyanosis, no use of accessory musculature Abdominal: No organomegaly, abdomen is soft and non-tender, positive bowel sounds. No masses. Skin: No rashes.  Neurologic: Facial musculature symmetric. Psychiatric: Patient acts appropriately throughout our interaction. Lymphatic: No cervical or submandibular lymphadenopathy Musculoskeletal: Gait intact. No edema, tenderness   LABS: Results for orders placed or performed during the hospital encounter of 01/09/16  Blood culture (routine x 2)  Result Value Ref Range   Specimen Description BLOOD RIGHT ARM    Special Requests BOTTLES DRAWN AEROBIC AND ANAEROBIC 5CC    Culture NO GROWTH 5 DAYS    Report Status 01/14/2016 FINAL   Blood culture (routine x 2)  Result Value Ref Range   Specimen Description BLOOD LEFT HAND    Special Requests BOTTLES DRAWN AEROBIC AND ANAEROBIC 5CC    Culture NO GROWTH 5 DAYS    Report Status 01/14/2016 FINAL   Culture, expectorated sputum-assessment  Result Value Ref Range   Specimen Description SPUTUM    Special Requests NONE    Sputum evaluation      THIS SPECIMEN IS ACCEPTABLE. RESPIRATORY CULTURE REPORT TO FOLLOW.   Report Status 01/14/2016 FINAL   Culture, respiratory (NON-Expectorated)  Result Value Ref Range   Specimen Description SPUTUM    Special Requests NONE    Gram Stain      ABUNDANT WBC PRESENT, PREDOMINANTLY PMN RARE SQUAMOUS EPITHELIAL CELLS PRESENT ABUNDANT GRAM POSITIVE COCCI IN PAIRS IN CHAINS Performed at [facility]    Culture      NORMAL  OROPHARYNGEAL FLORA Performed at [facility]    Report Status 01/17/2016 FINAL   CBC with Differential/Platelet  Result Value Ref Range   WBC 9.8 4.0 - 10.5 K/uL   RBC 4.37 3.87 - 5.11 MIL/uL   Hemoglobin 12.6 12.0 - 15.0 g/dL   HCT 37.0 36.0 - 46.0 %   MCV 84.7 78.0 - 100.0 fL   MCH 28.8 26.0 - 34.0 pg   MCHC 34.1 30.0 - 36.0 g/dL   RDW 14.7 11.5 - 15.5 %   Platelets 269 150 - 400 K/uL   Neutrophils Relative % 88 %   Neutro Abs 8.6 (H) 1.7 - 7.7 K/uL   Lymphocytes Relative 10 %   Lymphs Abs 1.0 0.7 - 4.0 K/uL   Monocytes Relative 2 %   Monocytes Absolute 0.2 0.1 - 1.0 K/uL   Eosinophils Relative 0 %   Eosinophils Absolute 0.0 0.0 - 0.7 K/uL   Basophils Relative 0 %   Basophils Absolute 0.0 0.0 - 0.1 K/uL  Basic metabolic panel  Result Value Ref Range   Sodium 131 (L) 135 - 145 mmol/L   Potassium 3.4 (L) 3.5 - 5.1 mmol/L   Chloride 88 (L) 101 - 111 mmol/L   CO2 31 22 - 32 mmol/L   Glucose, Bld 114 (H) 65 - 99 mg/dL   BUN <5 (L) 6 - 20 mg/dL   Creatinine, Ser 0.47 0.44 - 1.00 mg/dL   Calcium 8.9 8.9 - 10.3 mg/dL   GFR calc non Af Amer >60 >60 mL/min   GFR calc Af Amer >60 >60 mL/min   Anion gap 12 5 - 15  Troponin I  Result Value Ref Range   Troponin I <0.03 <0.031 ng/mL  Urinalysis, Routine w reflex microscopic (not at [facility])  Result Value Ref Range   Color, Urine YELLOW YELLOW   APPearance CLEAR CLEAR   Specific Gravity, Urine 1.004 (L) 1.005 - 1.030   pH 6.5 5.0 - 8.0   Glucose, UA NEGATIVE NEGATIVE mg/dL   Hgb urine dipstick NEGATIVE NEGATIVE   Bilirubin Urine NEGATIVE NEGATIVE   Ketones, ur 15 (  A) NEGATIVE mg/dL   Protein, ur NEGATIVE NEGATIVE mg/dL   Nitrite NEGATIVE NEGATIVE   Leukocytes, UA NEGATIVE NEGATIVE  CBC  Result Value Ref Range   WBC 5.7 4.0 - 10.5 K/uL   RBC 4.34 3.87 - 5.11 MIL/uL   Hemoglobin 11.9 (L) 12.0 - 15.0 g/dL   HCT 37.0 36.0 - 46.0 %   MCV 85.3 78.0 - 100.0 fL   MCH 27.4 26.0 - 34.0 pg   MCHC 32.2 30.0 - 36.0 g/dL    RDW 14.7 11.5 - 15.5 %   Platelets 257 150 - 400 K/uL  Creatinine, serum  Result Value Ref Range   Creatinine, Ser 0.56 0.44 - 1.00 mg/dL   GFR calc non Af Amer >60 >60 mL/min   GFR calc Af Amer >60 >60 mL/min  CBC WITH DIFFERENTIAL  Result Value Ref Range   WBC 4.4 4.0 - 10.5 K/uL   RBC 4.32 3.87 - 5.11 MIL/uL   Hemoglobin 12.1 12.0 - 15.0 g/dL   HCT 36.6 36.0 - 46.0 %   MCV 84.7 78.0 - 100.0 fL   MCH 28.0 26.0 - 34.0 pg   MCHC 33.1 30.0 - 36.0 g/dL   RDW 14.8 11.5 - 15.5 %   Platelets 224 150 - 400 K/uL   Neutrophils Relative % 77 %   Neutro Abs 3.4 1.7 - 7.7 K/uL   Lymphocytes Relative 21 %   Lymphs Abs 0.9 0.7 - 4.0 K/uL   Monocytes Relative 1 %   Monocytes Absolute 0.0 (L) 0.1 - 1.0 K/uL   Eosinophils Relative 0 %   Eosinophils Absolute 0.0 0.0 - 0.7 K/uL   Basophils Relative 1 %   Basophils Absolute 0.0 0.0 - 0.1 K/uL  Strep pneumoniae urinary antigen  Result Value Ref Range   Strep Pneumo Urinary Antigen NEGATIVE NEGATIVE  Legionella antigen, urine (not at [facility])  Result Value Ref Range   Specimen Description URINE, RANDOM    Special Requests NONE    Legionella Antigen, Urine      Negative for Legionella pneumophila serogroup 1                                                              Legionella pneumophila serogroup 1 antigen can be detected in urine within 2 to 3 days of infection and may persist even after treatment. This  assay does not detect other Legionella species or serogroups. Performed at [facility]    Report Status 01/11/2016 FINAL   Basic metabolic panel  Result Value Ref Range   Sodium 132 (L) 135 - 145 mmol/L   Potassium 3.6 3.5 - 5.1 mmol/L   Chloride 91 (L) 101 - 111 mmol/L   CO2 30 22 - 32 mmol/L   Glucose, Bld 164 (H) 65 - 99 mg/dL   BUN 6 6 - 20 mg/dL   Creatinine, Ser 0.42 (L) 0.44 - 1.00 mg/dL   Calcium 8.6 (L) 8.9 - 10.3 mg/dL   GFR calc non Af Amer >60 >60 mL/min   GFR calc Af Amer >60 >60 mL/min   Anion gap 11 5 - 15    Osmolality  Result Value Ref Range   Osmolality 283 275 - 295 mOsm/kg  Sodium, urine, random  Result Value Ref Range   Sodium, Ur <  10 mmol/L  Osmolality, urine  Result Value Ref Range   Osmolality, Ur 329 300 - 900 mOsm/kg  CBC WITH DIFFERENTIAL  Result Value Ref Range   WBC 7.5 4.0 - 10.5 K/uL   RBC 4.05 3.87 - 5.11 MIL/uL   Hemoglobin 11.3 (L) 12.0 - 15.0 g/dL   HCT 35.4 (L) 36.0 - 46.0 %   MCV 87.4 78.0 - 100.0 fL   MCH 27.9 26.0 - 34.0 pg   MCHC 31.9 30.0 - 36.0 g/dL   RDW 14.7 11.5 - 15.5 %   Platelets 229 150 - 400 K/uL   Neutrophils Relative % 81 %   Neutro Abs 6.0 1.7 - 7.7 K/uL   Lymphocytes Relative 15 %   Lymphs Abs 1.1 0.7 - 4.0 K/uL   Monocytes Relative 4 %   Monocytes Absolute 0.3 0.1 - 1.0 K/uL   Eosinophils Relative 0 %   Eosinophils Absolute 0.0 0.0 - 0.7 K/uL   Basophils Relative 0 %   Basophils Absolute 0.0 0.0 - 0.1 K/uL  Basic metabolic panel  Result Value Ref Range   Sodium 136 135 - 145 mmol/L   Potassium 3.9 3.5 - 5.1 mmol/L   Chloride 97 (L) 101 - 111 mmol/L   CO2 32 22 - 32 mmol/L   Glucose, Bld 136 (H) 65 - 99 mg/dL   BUN 9 6 - 20 mg/dL   Creatinine, Ser 0.44 0.44 - 1.00 mg/dL   Calcium 8.8 (L) 8.9 - 10.3 mg/dL   GFR calc non Af Amer >60 >60 mL/min   GFR calc Af Amer >60 >60 mL/min   Anion gap 7 5 - 15  CBC WITH DIFFERENTIAL  Result Value Ref Range   WBC 7.2 4.0 - 10.5 K/uL   RBC 4.09 3.87 - 5.11 MIL/uL   Hemoglobin 11.3 (L) 12.0 - 15.0 g/dL   HCT 36.0 36.0 - 46.0 %   MCV 88.0 78.0 - 100.0 fL   MCH 27.6 26.0 - 34.0 pg   MCHC 31.4 30.0 - 36.0 g/dL   RDW 14.9 11.5 - 15.5 %   Platelets 242 150 - 400 K/uL   Neutrophils Relative % 56 %   Neutro Abs 4.0 1.7 - 7.7 K/uL   Lymphocytes Relative 35 %   Lymphs Abs 2.6 0.7 - 4.0 K/uL   Monocytes Relative 9 %   Monocytes Absolute 0.7 0.1 - 1.0 K/uL   Eosinophils Relative 0 %   Eosinophils Absolute 0.0 0.0 - 0.7 K/uL   Basophils Relative 0 %   Basophils Absolute 0.0 0.0 - 0.1 K/uL  I-Stat  CG4 Lactic Acid, ED  Result Value Ref Range   Lactic Acid, Venous 1.41 0.5 - 2.0 mmol/L  I-Stat Arterial Blood Gas, ED - (order at [facility] and [facility] only)  Result Value Ref Range   pH, Arterial 7.445 7.350 - 7.450   pCO2 arterial 47.6 (H) 35.0 - 45.0 mmHg   pO2, Arterial 62.0 (L) 80.0 - 100.0 mmHg   Bicarbonate 32.7 (H) 20.0 - 24.0 mEq/L   TCO2 34 0 - 100 mmol/L   O2 Saturation 92.0 %   Acid-Base Excess 7.0 (H) 0.0 - 2.0 mmol/L   Patient temperature 98.6 F    Sample type ARTERIAL      EKG/XRAY:   Primary read interpreted by Dr. Marin Comment at [facility]   ASSESSMENT/PLAN: Encounter Diagnoses  Name Primary?  [name] Yes  . Right middle lobe pneumonia   . Shortness of breath   . COPD  GOLD II/ still smoking    She is dong better She is still SOB but with exertion mostly, has appt with PUlm soon Cont with meds Rx neb machine, also atrovewnt and albuerol solution  FU with pulm, she needs repeat CXR in 3-4 weeks or prn     Gross sideeffects, risk and benefits, and alternatives of medications d/w patient. Patient is aware that all medications have potential sideeffects and we are unable to predict every sideeffect or drug-drug interaction that may occur.  Lissandro Dilorenzo DO  01/17/2016 8:39 PM

## 2016-01-18 ENCOUNTER — Telehealth: Payer: Self-pay

## 2016-01-18 NOTE — Progress Notes (Signed)
Quick Note:  LVM for pt to return call ______ 

## 2016-01-18 NOTE — Telephone Encounter (Signed)
Msg is for Dr. Marin Comment, patient was seen yesterday night and needs a note to return to work on Thursday 23 instead of Monday 20th. She would like for it to be done today since she is close by.  Please advise,  [phone]

## 2016-01-23 ENCOUNTER — Telehealth: Payer: Self-pay | Admitting: Adult Health

## 2016-01-23 NOTE — Telephone Encounter (Signed)
Dr. Marin Comment - I don't see that you wrote a RTW note in the first place? Please advise.

## 2016-01-23 NOTE — Telephone Encounter (Signed)
Result Note     CT chest does show some improvement but extensive scarring and bronchiectasis     Will need to discuss in detail on return .     Please contact office for sooner follow up if symptoms do not improve or worsen or seek emergency care   ---  I spoke with patient about results and she verbalized understanding and had no questions

## 2016-01-23 NOTE — Progress Notes (Signed)
Quick Note:  LVM for pt to return call ______ 

## 2016-01-24 ENCOUNTER — Ambulatory Visit (INDEPENDENT_AMBULATORY_CARE_PROVIDER_SITE_OTHER)
Admission: RE | Admit: 2016-01-24 | Discharge: 2016-01-24 | Disposition: A | Discharge status: Home / Self Care | Payer: Managed Care, Other (non HMO) | Source: Ambulatory Visit | Attending: Adult Health | Admitting: Adult Health

## 2016-01-24 ENCOUNTER — Ambulatory Visit (INDEPENDENT_AMBULATORY_CARE_PROVIDER_SITE_OTHER): Payer: Managed Care, Other (non HMO) | Admitting: Adult Health

## 2016-01-24 ENCOUNTER — Encounter: Payer: Self-pay | Admitting: Adult Health

## 2016-01-24 VITALS — BP 110/64 | HR 98 | Temp 98.4°F | Ht 63.0 in | Wt 128.0 lb

## 2016-01-24 DIAGNOSIS — J449 Chronic obstructive pulmonary disease, unspecified: Secondary | ICD-10-CM

## 2016-01-24 DIAGNOSIS — J189 Pneumonia, unspecified organism: Secondary | ICD-10-CM

## 2016-01-24 MED ORDER — UMECLIDINIUM-VILANTEROL 62.5-25 MCG/INH IN AEPB: 1.0000 | INHALATION_SPRAY | Freq: Every day | RESPIRATORY_TRACT | Status: DC

## 2016-01-24 NOTE — Progress Notes (Signed)
Subjective:    Patient ID: Catherine Kelley, female    DOB: Apr 29, 1951, 65 y.o.   MRN: [id number]  HPI 65 yo female smoker followed for recurrent PNA, Bronchiectasis , and COPD   PFT 2016  showed an FEV1 of 1.45 L, which was 64% of predicted, ratio of 72%, no significant bronchodilator response, FVC at 70%, air trapping noted. Diffusing capacity decreased at 67%   01/24/2016 [facility] follow up  Pt returns for a post hospital follow up . She had 3 hospitalization over last 6 months for HCAP  Admitted this month with HCAP and acute bronchiectasis . Tx/ wVan and aztreonam. 2 D echo showed grade 1 DD . PAP 37 .  Discharged on levaquin and pred taper . Sputum cx were normal this admit .  CT chest prior to admit showed decreased consolidation with RLL/RML cavitary areas and extensive bronchiectasis.   Since discharge she is feeling weak, and still coughing up green mucus.  CXR today shows persistent RML/RLL pna.  Still smoking , cessation discussed .  Husband is not supportive and smokes at home .  Previously on [place] but says she is not taking . Spiriva got too expensive .  Did not take dulera except for sample.  Says she thinks her insurance will cover inahlers now she has met her deductible.   Dec admit was for  recurrent RLL/RML HCAP +/- bronchiectasis flare/Psuedomonas + cx .  She underwent extenisve testing to look at causes for recurrent PNA CT head 12/1 >> neg acute, chronic sinus disease Swallow eval ([facility]) 12/4 >> mild dysphagia, no obvious aspiration on this eval but confirms moderate aspiration risk. Dys II diet recommended  HRCT 12/3 >> Significant interval worsening of cavitary infection in the right middle and right lower lobes, with now complete right lower lobe consolidation and worsening basilar right middle lobe consolidation. Underlying severe cylindrical and varicoid bronchiectasis throughout the right middle and right lower lobes. She does cont to smoke,  cessation discussed. .  She had necrotizing PNA in 2015 .  She was tx w/ prolonged abx w/ Cipro x 4 weeks. Cx are neg with Fungal/MAI.       Current Medications, Allergies, Complete Past Medical History, Past Surgical History, Family History, and Social History were reviewed in [employer] record.  ROS  The following are not active complaints unless bolded sore throat, dysphagia, dental problems, itching, sneezing,  nasal congestion or excess/ purulent secretions, ear ache,   sweats, unintended wt loss, classically pleuritic or exertional cp, hemoptysis,  orthopnea pnd or leg swelling, presyncope, palpitations, abdominal pain, anorexia, nausea, vomiting, diarrhea  or change in bowel or bladder habits, change in stools or urine, dysuria,hematuria,  rash, arthralgias, visual complaints, headache, numbness, weakness or ataxia or problems with walking or coordination,  change in mood/affect or memory.                   Objective:   Physical Exam   amb chronically ill wf  Vital signs reviewed Filed Vitals:   01/24/16 1554  BP: 110/64  Pulse: 98  Temp: 98.4 F (36.9 C)  TempSrc: Oral  Height: _0  (1.6 m)  Weight: 128 lb (58.06 kg)  SpO2: 94%      HEENT: nl dentition, turbinates, and orophanx. Nl external ear canals without cough reflex   NECK :  without JVD/Nodes/TM/ nl carotid upstrokes bilaterally   LUNGS: few scattered rhonchi    CV:  RRR  no s3 or murmur or increase in P2,  Tr-1 edema   ABD:  soft and nontender with nl excursion in the supine position. No bruits or organomegaly, bowel sounds nl  MS:  warm without deformities, calf tenderness, cyanosis or clubbing  SKIN: warm and dry without lesions    NEURO:  alert, approp, no deficits      01/24/2016 CXR -reviewed independently and agree with radiology interpretation.  Persistent right middle lobe and right lower lobe pneumonia

## 2016-01-24 NOTE — Telephone Encounter (Signed)
Please write her a note , for the dates she needs. Thanks

## 2016-01-24 NOTE — Addendum Note (Signed)
Addended by: Osa Craver on: 01/24/2016 04:39 PM   Modules accepted: Orders, Medications

## 2016-01-24 NOTE — Patient Instructions (Signed)
Begin ANORO 1 puff daily  Use mucinex Twice daily  As needed  Congestion  Use Flutter valve several times a day.  Work on not smoking  Use Albuterol Neb As needed  Wheezing /shortness of breath.  Follow up Dr. Chase Caller in next week as planned and As needed  Please contact office for sooner follow up if symptoms do not improve or worsen or seek emergency care

## 2016-01-24 NOTE — Assessment & Plan Note (Signed)
Recurrent exacerbation with ongoing smoking   Plan  Begin ANORO 1 puff daily  Use mucinex Twice daily  As needed  Congestion  Use Flutter valve several times a day.  Work on not smoking  Use Albuterol Neb As needed  Wheezing /shortness of breath.  Follow up Dr. Chase Caller in next week as planned and As needed  Please contact office for sooner follow up if symptoms do not improve or worsen or seek emergency care

## 2016-01-24 NOTE — Assessment & Plan Note (Signed)
Recurrent /slow  to resolve cavitary PNA in smoker  Psueomonas positive on cx in Dec tx w/ prolonged abx.  Most recent CT chest earlier this month with slowly resolving areas wtihout clearance .  She has finished course of abx, will cont to follow  Will need CT chest in 6 weeks  advsied on smoking cessation  Use flutter valve.  Previous swallow eval with only mild dysphagia, cont w/ D-2 diet.

## 2016-01-25 NOTE — Telephone Encounter (Signed)
Written.

## 2016-01-25 NOTE — Telephone Encounter (Signed)
Attention Dr. Le:Pt would  like a RTW note written for her to go back on 2/27. Please advise at [phone] (H)

## 2016-01-29 ENCOUNTER — Ambulatory Visit: Payer: Managed Care, Other (non HMO) | Admitting: Internal Medicine

## 2016-02-04 ENCOUNTER — Encounter: Payer: Self-pay | Admitting: Internal Medicine

## 2016-02-04 ENCOUNTER — Telehealth: Payer: Self-pay | Admitting: Internal Medicine

## 2016-02-04 ENCOUNTER — Ambulatory Visit (INDEPENDENT_AMBULATORY_CARE_PROVIDER_SITE_OTHER): Payer: Managed Care, Other (non HMO) | Admitting: Internal Medicine

## 2016-02-04 ENCOUNTER — Other Ambulatory Visit (INDEPENDENT_AMBULATORY_CARE_PROVIDER_SITE_OTHER): Payer: Managed Care, Other (non HMO)

## 2016-02-04 VITALS — BP 122/70 | HR 102 | Temp 98.1°F | Ht 63.0 in | Wt 130.4 lb

## 2016-02-04 DIAGNOSIS — R531 Weakness: Secondary | ICD-10-CM

## 2016-02-04 DIAGNOSIS — J471 Bronchiectasis with (acute) exacerbation: Secondary | ICD-10-CM | POA: Diagnosis not present

## 2016-02-04 DIAGNOSIS — R5383 Other fatigue: Secondary | ICD-10-CM | POA: Diagnosis not present

## 2016-02-04 LAB — CBC
HCT: 33.9 % — ABNORMAL LOW (ref 36.0–46.0)
Hemoglobin: 11.4 g/dL — ABNORMAL LOW (ref 12.0–15.0)
MCHC: 33.6 g/dL (ref 30.0–36.0)
MCV: 83.7 fl (ref 78.0–100.0)
Platelets: 465 10*3/uL — ABNORMAL HIGH (ref 150.0–400.0)
RBC: 4.05 Mil/uL (ref 3.87–5.11)
RDW: 15.5 % (ref 11.5–15.5)
WBC: 13 10*3/uL — ABNORMAL HIGH (ref 4.0–10.5)

## 2016-02-04 LAB — BASIC METABOLIC PANEL
BUN: 4 mg/dL — ABNORMAL LOW (ref 6–23)
CALCIUM: 9 mg/dL (ref 8.4–10.5)
CHLORIDE: 85 meq/L — AB (ref 96–112)
CO2: 34 meq/L — AB (ref 19–32)
Creatinine, Ser: 0.37 mg/dL — ABNORMAL LOW (ref 0.40–1.20)
GFR: 186.01 mL/min (ref >60.00)
Glucose, Bld: 144 mg/dL — ABNORMAL HIGH (ref 70–99)
Potassium: 3.3 mEq/L — ABNORMAL LOW (ref 3.5–5.1)
SODIUM: 126 meq/L — AB (ref 135–145)

## 2016-02-04 MED ORDER — SODIUM CHLORIDE 3 % IN NEBU: INHALATION_SOLUTION | RESPIRATORY_TRACT | Status: DC | PRN

## 2016-02-04 MED ORDER — PREDNISONE 10 MG PO TABS: ORAL_TABLET | ORAL | Status: DC

## 2016-02-04 MED ORDER — LEVOFLOXACIN 500 MG PO TABS: 500.0000 mg | ORAL_TABLET | Freq: Every day | ORAL | Status: DC

## 2016-02-04 NOTE — Progress Notes (Addendum)
Subjective:     Patient ID: Catherine Kelley, female   DOB: Aug 02, 1951, 65 y.o.   MRN: [id number]  HPI     OV 02/04/2016  Chief Complaint  Patient presents with  . Follow-up    Pt saw TP on 2.23.17 for HFU. Pt states her breathing has not improved much since seeing TP. Pt c/o prod cough with yellow and green mucus, SOB, weakness.    Follow-up right lower lobe and middle lobe bronchiectasis following pneumonia in August 2015. Last seen by her nurse practitioner Neoma Laming 23rd 2017 which was a hospital follow-up. She says now and her husband is with her for the first time that for the last 5 days she's had low-grade fever increased sputum volume and change in color of sputum from yellow to green and increase chest tightness and wheezing. This associated fatigue. Symptoms are consistent with a bronchitic process exacerbation. She is interested in apply for permanent disability. She still not interested in a surgical evaluation. She continues to smoke. She is really worried about her long-term health. And so is her husband. They are also reporting significant fatigue.  She continues to have significant amount of sputum. She has tried chest PT at home with the husband but it does not work. I'm not sure with the technique. She is on inhaler therapy. She is frail and kyphotic and I doubt she can handle chest PT. She is also got significant physical fatigue. Husband was there and is supportive but is not available all the time to do chest PT. She's had multiple exacerbations. Therefore I think she'll qualify for a vest    has a past medical history of Hypertension; Tobacco abuse; Anxiety; Depression; Emphysematous COPD ([place]); COPD (chronic obstructive pulmonary disease) ([place]); History of echocardiogram (12/24/2006); History of cardiovascular stress test (08/15/2004); Diastolic dysfunction; Allergy; Asthma; Heart murmur; Pneumonia; Chronic bronchitis ([place]); and GERD (gastroesophageal reflux disease).   reports that  she has been smoking Cigarettes.  She has a 42 pack-year smoking history. She has never used smokeless tobacco.  Past Surgical History  Procedure Laterality Date  . Tubal ligation  1986    Allergies  Allergen Reactions  . Ceclor [Cefaclor] Hives  . Escitalopram Oxalate     Pt does not recall ever taking medication  . Sertraline Hcl     Severe Headache  . Sulfa Drugs Cross Reactors Hives and Rash    Hives and rash    Immunization History  Administered Date(s) Administered  . Influenza Split 09/29/2012, 08/31/2013  . Influenza,inj,Quad PF,36+ Mos 09/06/2014, 09/28/2014, 10/10/2015  . Pneumococcal Conjugate-13 12/13/2014  . Pneumococcal Polysaccharide-23 08/31/2013, 11/02/2015  . Tdap 07/08/2015    Family History  Problem Relation Age of Onset  . Stroke Mother   . Heart disease Father   . Hyperlipidemia Father   . Hypertension Father   . Breast cancer Maternal Aunt   . Asthma Maternal Grandmother      Current outpatient prescriptions:  .  albuterol (PROVENTIL) (2.5 MG/3ML) 0.083% nebulizer solution, Take 3 mLs (2.5 mg total) by nebulization every 6 (six) hours as needed for wheezing or shortness of breath., Disp: 75 mL, Rfl: 12 .  ALPRAZolam (XANAX) 0.5 MG tablet, Take 0.5 mg by mouth at bedtime. , Disp: , Rfl:  .  benzonatate (TESSALON) 100 MG capsule, TAKE 1 TO 2 CAPSULES BY MOUTH 3 TIMES DAILY AS NEEDED FOR COUGH, Disp: 60 capsule, Rfl: 2 .  divalproex (DEPAKOTE ER) 500 MG 24 hr tablet, Take 500 mg by mouth  at bedtime. , Disp: , Rfl:  .  fluticasone (FLONASE) 50 MCG/ACT nasal spray, Place 2 sprays into both nostrils daily., Disp: , Rfl:  .  furosemide (LASIX) 20 MG tablet, TAKE 2 TABLETS BY MOUTH EVERY DAY (Patient taking differently: Take 20 mg by mouth daily), Disp: 180 tablet, Rfl: 1 .  guaiFENesin (MUCINEX) 600 MG 12 hr tablet, Take 1 tablet (600 mg total) by mouth 2 (two) times daily., Disp: 60 tablet, Rfl: 0 .  HYDROcodone-acetaminophen (NORCO/VICODIN) 5-325 MG  tablet, Take 1 tablet by mouth every 6 (six) hours as needed for moderate pain. (Patient taking differently: Take 0.5-1 tablets by mouth every 6 (six) hours as needed for moderate pain. ), Disp: 40 tablet, Rfl: 0 .  ipratropium (ATROVENT) 0.02 % nebulizer solution, Take 2.5 mLs (0.5 mg total) by nebulization every 6 (six) hours as needed for wheezing or shortness of breath., Disp: 75 mL, Rfl: 12 .  ipratropium (ATROVENT) 0.03 % nasal spray, Place 2 sprays into both nostrils 2 (two) times daily. Reported on 12/19/2015, Disp: , Rfl: 11 .  nadolol (CORGARD) 40 MG tablet, Take 20 mg by mouth daily. , Disp: , Rfl:  .  nortriptyline (PAMELOR) 50 MG capsule, Take 50 mg by mouth at bedtime., Disp: , Rfl: 0 .  tiZANidine (ZANAFLEX) 4 MG tablet, Take 1 tablet (4 mg total) by mouth every 8 (eight) hours as needed for muscle spasms., Disp: 45 tablet, Rfl: 0 .  umeclidinium-vilanterol (ANORO ELLIPTA) 62.5-25 MCG/INH AEPB, Inhale 1 puff into the lungs daily., Disp: 1 each, Rfl: 5     Review of Systems     Objective:   Physical Exam Filed Vitals:   02/04/16 1453  BP: 122/70  Pulse: 102  Temp: 98.1 F (36.7 C)  TempSrc: Oral  Height: '5\' 3"'$  (1.6 m)  Weight: 130 lb 6.4 oz (59.149 kg)  SpO2: 94%    Body mass index is 23.11 kg/(m^2).    Gen. exam: Frail cachectic female HEENT: Poor dentition. Neck is supple. Oral cavity is without thrush. Respiratory exam: Clear to auscultation except in the right middle lobe and lower lobe she has crackles. No visible distress no wheeze Cardiovascular: Normal heart sounds regular rate and rhythm Abdomen: Soft nontender tender no organomegaly Extremity: No cyanosis no clubbing no edema.      Assessment:       ICD-9-CM ICD-10-CM   1. Bronchiectasis with acute exacerbation (HCC) 494.1 J47.1 CBC     Basic Metabolic Panel (BMET)     Respiratory or Resp and Sputum Culture     Ambulatory Referral for DME     CT Chest Wo Contrast  2. Other fatigue 780.79 R53.83         Plan:     #Right lower Lobe necrotizing CAP Pneumonia - Aug 2015  - slow radiological resoution as of CT chest dec 2015 but with residual bronchiectasis march 2016 and feb 2017 with intermittent hospitalizations and flareups requiring treatment - Currently you might be in a flareup early phase  - Plan  - Do complete blood count, chemistry, sputum Gram stain and culture -  take levaquin '500mg'$  once daily  X 6 days (qtc 445 msec in feb 2017 so should be ok to Rx)   - Please take prednisone 40 mg x1 day, then 30 mg x1 day, then 20 mg x1 day, then 10 mg x1 day, and then 5 mg x1 day and stop   - start 3% saline neb 2-5 ml through [facility] at least twice a day - You should apply for disability - Start vibratory vest treatment - Your husband should try do chest physical therapy a new drain out of phlegm just as I instructed with head down  - when possible - Quit smoking - Respect your decision to defer surgical evaluation for right lower lobe and middle lobe bronchiectasis - do repeat CT chest wo contrast  In 2 months  followup  - 2 months or sooner if needed either with me or NP   > 50% of this > 25 min visit spent in face to face counseling or coordination of care    Dr. Brand Males, M.D., [facility].C.P Pulmonary and Critical Care Medicine Staff Physician [facility] Pager: [phone], If no answer or between  15:00h - 7:00h: call [phone]  02/04/2016 3:29 PM

## 2016-02-04 NOTE — Patient Instructions (Addendum)
#  Right lower Lobe necrotizing CAP Pneumonia - Aug 2015  - slow radiological resoution as of CT chest dec 2015 but with residual bronchiectasis march 2016 and feb 2017 with intermittent hospitalizations and flareups requiring treatment - Currently you might be in a flareup early phase  - Plan  - Do complete blood count, chemistry, sputum Gram stain and culture -  take levaquin '500mg'$  once daily  X 6 days  - Please take prednisone 40 mg x1 day, then 30 mg x1 day, then 20 mg x1 day, then 10 mg x1 day, and then 5 mg x1 day and stop   - start 3% saline neb 2-5 ml through [facility] at least twice a day - You should apply for disability - Start vibratory vest treatment - Your husband should do chest physical therapy a new drain out of phlegm just as I instructed with head down - Quit smoking - Respect your decision to defer surgical evaluation for right lower lobe and middle lobe bronchiectasis - do repeat CT chest wo contrast  In 2 months  followup  - 2 months or sooner if needed either with me or NP

## 2016-02-04 NOTE — Telephone Encounter (Signed)
Her Na has dropped dramatically and explain most of her weakness . Is she on any diuretic? - refer to endocrine for a quick appt - Dr Ruffin Frederick or DR Letta Median in our practice . If not, Dr Buddy Duty at [place] or Dr Chalmers Cater at [facility] also low - ask her to take kcl 46mq daily  RGold Barin 1 week  WC high and she shiould abx  And pred as advised  .   PULMONARY No results for input(s): PHART, PCO2ART, PO2ART, HCO3, TCO2, O2SAT in the last 168 hours.  Invalid input(s): PCO2, PO2  CBC  Recent Labs Lab 02/04/16 1613  HGB 11.4*  HCT 33.9*  WBC 13.0*  PLT 465.0*    COAGULATION No results for input(s): INR in the last 168 hours.  CARDIAC  No results for input(s): TROPONINI in the last 168 hours. No results for input(s): PROBNP in the last 168 hours.   CHEMISTRY  Recent Labs Lab 02/04/16 1613  NA 126*  K 3.3*  CL 85*  CO2 34*  GLUCOSE 144*  BUN 4*  CREATININE 0.37*  CALCIUM 9.0   Estimated Creatinine Clearance: 58 mL/min (by C-G formula based on Cr of 0.37).   LIVER No results for input(s): AST, ALT, ALKPHOS, BILITOT, PROT, ALBUMIN, INR in the last 168 hours.   INFECTIOUS No results for input(s): LATICACIDVEN, PROCALCITON in the last 168 hours.   ENDOCRINE CBG (last 3)  No results for input(s): GLUCAP in the last 72 hours.       IMAGING x48h  - image(s) personally visualized  -   highlighted in bold No results found.   s

## 2016-02-05 MED ORDER — POTASSIUM CHLORIDE 20 MEQ PO PACK
PACK | ORAL | Status: DC
Start: 2016-02-05 — End: 2016-05-10

## 2016-02-05 NOTE — Telephone Encounter (Signed)
Pt returned call. Reviewed results and recs. I verified pharmacy as [facility] on [address]. Pt states she is currently taking lasix '20mg'$  daily. Pt voiced understanding and had no further questions. Rx sent to the pharmacy, referral placed and labs ordered. Nothing further needed.

## 2016-02-05 NOTE — Telephone Encounter (Signed)
lmtcb X1 for pt. Will forward to [place] to ensure follow up as these are lab results.

## 2016-02-07 LAB — RESPIRATORY CULTURE OR RESPIRATORY AND SPUTUM CULTURE
GRAM STAIN: NONE SEEN
Organism ID, Bacteria: NORMAL

## 2016-02-07 NOTE — Progress Notes (Signed)
Quick Note:  Referral already placed. Pt aware per phone note from 3.6.17. Nothing further needed at this time. ______

## 2016-02-12 ENCOUNTER — Encounter: Payer: Self-pay | Admitting: Physician Assistant

## 2016-02-12 ENCOUNTER — Encounter (HOSPITAL_COMMUNITY): Payer: Self-pay

## 2016-02-12 ENCOUNTER — Emergency Department (HOSPITAL_COMMUNITY): Payer: Managed Care, Other (non HMO)

## 2016-02-12 ENCOUNTER — Ambulatory Visit (INDEPENDENT_AMBULATORY_CARE_PROVIDER_SITE_OTHER): Payer: Managed Care, Other (non HMO) | Admitting: Emergency Medicine

## 2016-02-12 ENCOUNTER — Inpatient Hospital Stay (HOSPITAL_COMMUNITY)
Admission: EM | Admit: 2016-02-12 | Discharge: 2016-02-16 | DRG: 871 | Disposition: A | Discharge status: Home / Self Care | Payer: Managed Care, Other (non HMO) | Attending: Internal Medicine | Admitting: Internal Medicine

## 2016-02-12 VITALS — BP 122/72 | HR 97 | Temp 98.6°F | Resp 16

## 2016-02-12 DIAGNOSIS — J449 Chronic obstructive pulmonary disease, unspecified: Secondary | ICD-10-CM | POA: Diagnosis not present

## 2016-02-12 DIAGNOSIS — A419 Sepsis, unspecified organism: Principal | ICD-10-CM | POA: Diagnosis present

## 2016-02-12 DIAGNOSIS — J9601 Acute respiratory failure with hypoxia: Secondary | ICD-10-CM | POA: Diagnosis present

## 2016-02-12 DIAGNOSIS — R600 Localized edema: Secondary | ICD-10-CM | POA: Diagnosis not present

## 2016-02-12 DIAGNOSIS — R609 Edema, unspecified: Secondary | ICD-10-CM

## 2016-02-12 DIAGNOSIS — R05 Cough: Secondary | ICD-10-CM

## 2016-02-12 DIAGNOSIS — R059 Cough, unspecified: Secondary | ICD-10-CM

## 2016-02-12 DIAGNOSIS — E871 Hypo-osmolality and hyponatremia: Secondary | ICD-10-CM

## 2016-02-12 DIAGNOSIS — J189 Pneumonia, unspecified organism: Secondary | ICD-10-CM | POA: Diagnosis present

## 2016-02-12 DIAGNOSIS — Z720 Tobacco use: Secondary | ICD-10-CM | POA: Diagnosis present

## 2016-02-12 DIAGNOSIS — J440 Chronic obstructive pulmonary disease with acute lower respiratory infection: Secondary | ICD-10-CM | POA: Diagnosis present

## 2016-02-12 DIAGNOSIS — F17210 Nicotine dependence, cigarettes, uncomplicated: Secondary | ICD-10-CM | POA: Diagnosis present

## 2016-02-12 DIAGNOSIS — I10 Essential (primary) hypertension: Secondary | ICD-10-CM | POA: Diagnosis present

## 2016-02-12 DIAGNOSIS — E876 Hypokalemia: Secondary | ICD-10-CM | POA: Diagnosis not present

## 2016-02-12 HISTORY — DX: Pneumonia, unspecified organism: J18.9

## 2016-02-12 HISTORY — DX: Gangrene and necrosis of lung: J85.0

## 2016-02-12 LAB — BASIC METABOLIC PANEL
ANION GAP: 10 (ref 5–15)
BUN: <5 mg/dL — ABNORMAL LOW (ref 6–20)
CALCIUM: 8.5 mg/dL — AB (ref 8.9–10.3)
CO2: 28 mmol/L (ref 22–32)
Chloride: 91 mmol/L — ABNORMAL LOW (ref 101–111)
Creatinine, Ser: 0.44 mg/dL (ref 0.44–1.00)
Glucose, Bld: 109 mg/dL — ABNORMAL HIGH (ref 65–99)
POTASSIUM: 3.4 mmol/L — AB (ref 3.5–5.1)
Sodium: 129 mmol/L — ABNORMAL LOW (ref 135–145)

## 2016-02-12 LAB — CBC
HCT: 35.5 % — ABNORMAL LOW (ref 36.0–46.0)
HEMOGLOBIN: 11.4 g/dL — AB (ref 12.0–15.0)
MCH: 27.5 pg (ref 26.0–34.0)
MCHC: 32.1 g/dL (ref 30.0–36.0)
MCV: 85.7 fL (ref 78.0–100.0)
Platelets: 391 10*3/uL (ref 150–400)
RBC: 4.14 MIL/uL (ref 3.87–5.11)
RDW: 15.3 % (ref 11.5–15.5)
WBC: 19.1 10*3/uL — AB (ref 4.0–10.5)

## 2016-02-12 LAB — I-STAT TROPONIN, ED: TROPONIN I, POC: 0 ng/mL (ref 0.00–0.08)

## 2016-02-12 LAB — I-STAT CG4 LACTIC ACID, ED: LACTIC ACID, VENOUS: 2.82 mmol/L — AB (ref 0.5–2.0)

## 2016-02-12 LAB — BRAIN NATRIURETIC PEPTIDE: B NATRIURETIC PEPTIDE 5: 136.2 pg/mL — AB (ref 0.0–100.0)

## 2016-02-12 MED ORDER — ONDANSETRON HCL 4 MG/2ML IJ SOLN: 4.0000 mg | Freq: Four times a day (QID) | INTRAMUSCULAR | Status: DC | PRN

## 2016-02-12 MED ORDER — ACETAMINOPHEN 325 MG PO TABS
650.0000 mg | ORAL_TABLET | Freq: Four times a day (QID) | ORAL | Status: DC | PRN
  Administered 2016-02-14 – 2016-02-15 (×3): 650 mg via ORAL
  Filled 2016-02-12 (×3): qty 2

## 2016-02-12 MED ORDER — SODIUM CHLORIDE 0.9 % IV SOLN: 250.0000 mL | INTRAVENOUS | Status: DC | PRN

## 2016-02-12 MED ORDER — VANCOMYCIN HCL 500 MG IV SOLR
500.0000 mg | Freq: Two times a day (BID) | INTRAVENOUS | Status: DC
  Administered 2016-02-13 – 2016-02-15 (×5): 500 mg via INTRAVENOUS
  Filled 2016-02-12 (×8): qty 500

## 2016-02-12 MED ORDER — ENOXAPARIN SODIUM 40 MG/0.4ML ~~LOC~~ SOLN
40.0000 mg | SUBCUTANEOUS | Status: DC
  Administered 2016-02-13 – 2016-02-16 (×4): 40 mg via SUBCUTANEOUS
  Filled 2016-02-12 (×4): qty 0.4

## 2016-02-12 MED ORDER — DIVALPROEX SODIUM ER 500 MG PO TB24
500.0000 mg | ORAL_TABLET | Freq: Every day | ORAL | Status: DC
  Administered 2016-02-12 – 2016-02-15 (×4): 500 mg via ORAL
  Filled 2016-02-12 (×4): qty 1

## 2016-02-12 MED ORDER — DEXTROSE 5 % IV SOLN
2.0000 g | Freq: Three times a day (TID) | INTRAVENOUS | Status: DC
  Administered 2016-02-13 (×3): 2 g via INTRAVENOUS
  Filled 2016-02-12 (×6): qty 2

## 2016-02-12 MED ORDER — IPRATROPIUM-ALBUTEROL 0.5-2.5 (3) MG/3ML IN SOLN
3.0000 mL | RESPIRATORY_TRACT | Status: DC
  Administered 2016-02-12 – 2016-02-13 (×3): 3 mL via RESPIRATORY_TRACT
  Filled 2016-02-12 (×3): qty 3

## 2016-02-12 MED ORDER — NORTRIPTYLINE HCL 25 MG PO CAPS
50.0000 mg | ORAL_CAPSULE | Freq: Every day | ORAL | Status: DC
Start: 2016-02-12 — End: 2016-02-16
  Administered 2016-02-13 – 2016-02-15 (×4): 50 mg via ORAL
  Filled 2016-02-12 (×4): qty 2

## 2016-02-12 MED ORDER — SODIUM CHLORIDE 0.9% FLUSH: 3.0000 mL | INTRAVENOUS | Status: DC | PRN

## 2016-02-12 MED ORDER — VANCOMYCIN HCL IN DEXTROSE 1-5 GM/200ML-% IV SOLN
1000.0000 mg | Freq: Once | INTRAVENOUS | Status: AC
  Administered 2016-02-12: 1000 mg via INTRAVENOUS
  Filled 2016-02-12: qty 200

## 2016-02-12 MED ORDER — ALUM & MAG HYDROXIDE-SIMETH 200-200-20 MG/5ML PO SUSP
30.0000 mL | Freq: Four times a day (QID) | ORAL | Status: DC | PRN
  Administered 2016-02-16: 30 mL via ORAL
  Filled 2016-02-12: qty 30

## 2016-02-12 MED ORDER — FUROSEMIDE 10 MG/ML IJ SOLN
40.0000 mg | Freq: Once | INTRAMUSCULAR | Status: AC
  Administered 2016-02-12: 40 mg via INTRAVENOUS
  Filled 2016-02-12: qty 4

## 2016-02-12 MED ORDER — NADOLOL 20 MG PO TABS
20.0000 mg | ORAL_TABLET | Freq: Every day | ORAL | Status: DC
  Administered 2016-02-13 – 2016-02-16 (×4): 20 mg via ORAL
  Filled 2016-02-12 (×7): qty 1

## 2016-02-12 MED ORDER — DEXTROSE 5 % IV SOLN
1.0000 g | Freq: Three times a day (TID) | INTRAVENOUS | Status: DC
  Administered 2016-02-12: 1 g via INTRAVENOUS
  Filled 2016-02-12 (×3): qty 1

## 2016-02-12 MED ORDER — OXYCODONE HCL 5 MG PO TABS
5.0000 mg | ORAL_TABLET | ORAL | Status: DC | PRN
  Administered 2016-02-13 – 2016-02-14 (×2): 5 mg via ORAL
  Filled 2016-02-12 (×2): qty 1

## 2016-02-12 MED ORDER — SODIUM CHLORIDE 0.9 % IV BOLUS (SEPSIS)
1000.0000 mL | INTRAVENOUS | Status: AC
  Administered 2016-02-12 – 2016-02-13 (×2): 1000 mL via INTRAVENOUS

## 2016-02-12 MED ORDER — ONDANSETRON HCL 4 MG PO TABS: 4.0000 mg | ORAL_TABLET | Freq: Four times a day (QID) | ORAL | Status: DC | PRN

## 2016-02-12 MED ORDER — GUAIFENESIN ER 600 MG PO TB12
600.0000 mg | ORAL_TABLET | Freq: Two times a day (BID) | ORAL | Status: DC
  Administered 2016-02-12 – 2016-02-16 (×8): 600 mg via ORAL
  Filled 2016-02-12 (×8): qty 1

## 2016-02-12 MED ORDER — ACETAMINOPHEN 650 MG RE SUPP
650.0000 mg | Freq: Four times a day (QID) | RECTAL | Status: DC | PRN
Start: 2016-02-12 — End: 2016-02-16

## 2016-02-12 MED ORDER — BENZONATATE 100 MG PO CAPS
100.0000 mg | ORAL_CAPSULE | Freq: Three times a day (TID) | ORAL | Status: DC | PRN
  Administered 2016-02-14 – 2016-02-16 (×3): 100 mg via ORAL
  Filled 2016-02-12 (×3): qty 1

## 2016-02-12 MED ORDER — ALBUTEROL SULFATE (2.5 MG/3ML) 0.083% IN NEBU
5.0000 mg | INHALATION_SOLUTION | Freq: Once | RESPIRATORY_TRACT | Status: AC
  Administered 2016-02-12: 5 mg via RESPIRATORY_TRACT
  Filled 2016-02-12: qty 6

## 2016-02-12 MED ORDER — POTASSIUM CHLORIDE CRYS ER 20 MEQ PO TBCR
20.0000 meq | EXTENDED_RELEASE_TABLET | Freq: Every day | ORAL | Status: DC
  Administered 2016-02-13 – 2016-02-16 (×4): 20 meq via ORAL
  Filled 2016-02-12 (×4): qty 1

## 2016-02-12 MED ORDER — HYDROMORPHONE HCL 1 MG/ML IJ SOLN
0.5000 mg | INTRAMUSCULAR | Status: DC | PRN
Start: 2016-02-12 — End: 2016-02-16

## 2016-02-12 MED ORDER — ALPRAZOLAM 0.5 MG PO TABS
0.5000 mg | ORAL_TABLET | Freq: Every day | ORAL | Status: DC
  Administered 2016-02-12: 0.5 mg via ORAL
  Filled 2016-02-12: qty 1

## 2016-02-12 MED ORDER — SODIUM CHLORIDE 0.9% FLUSH
3.0000 mL | Freq: Two times a day (BID) | INTRAVENOUS | Status: DC
  Administered 2016-02-12 – 2016-02-16 (×3): 3 mL via INTRAVENOUS

## 2016-02-12 NOTE — Progress Notes (Signed)
Pharmacy Antibiotic Note  Catherine Kelley is a 65 y.o. female admitted on 02/12/2016 with pneumonia.  Pharmacy has been consulted for Vancomycin dosing.  Plan: -Vancomycin 500 mg IV q12h -Already on aztreonam -Trend WBC, temp, renal function  -Drug levels at steady state  Height: '5\' 3"'$  (160 cm) Weight: 130 lb (58.968 kg) IBW/kg (Calculated) : 52.4  Temp (24hrs), Avg:98.8 F (37.1 C), Min:98.6 F (37 C), Max:99 F (37.2 C)   Recent Labs Lab 02/12/16 1335 02/12/16 2211  WBC 19.1*  --   CREATININE 0.44  --   LATICACIDVEN  --  2.82*    Estimated Creatinine Clearance: 58 mL/min (by C-G formula based on Cr of 0.44).    Allergies  Allergen Reactions  . Ceclor [Cefaclor] Hives  . Escitalopram Oxalate     Pt does not recall ever taking medication  . Sertraline Hcl     Severe Headache  . Sulfa Drugs Cross Reactors Hives and Rash    Hives and rash     [name] 02/12/2016 11:22 PM

## 2016-02-12 NOTE — Patient Instructions (Addendum)
Please go to [facility] ER asap. You need to be seen there as your fluid level is very high and your labs have been abnormal recently. They are aware you are coming.

## 2016-02-12 NOTE — ED Notes (Signed)
Patient here with increasing lower extremity swelling x 2 weeks, increased cough and congestion with same

## 2016-02-12 NOTE — Progress Notes (Addendum)
Pharmacy Antibiotic Note  Catherine Kelley is a 65 y.o. female admitted on 02/12/2016 with pneumonia.  Pharmacy has been consulted for Aztreonam dosing. WBC 19.1, afebrile. CrCl ~36m/min  Plan: Aztreonam 1g IV Q8h  F/U c/s, renal fxn, LOT  Height: '5\' 3"'$  (160 cm) Weight: 130 lb (58.968 kg) IBW/kg (Calculated) : 52.4  Temp (24hrs), Avg:98.8 F (37.1 C), Min:98.6 F (37 C), Max:99 F (37.2 C)   Recent Labs Lab 02/12/16 1335  WBC 19.1*  CREATININE 0.44    Estimated Creatinine Clearance: 58 mL/min (by C-G formula based on Cr of 0.44).    Allergies  Allergen Reactions  . Ceclor [Cefaclor] Hives  . Escitalopram Oxalate     Pt does not recall ever taking medication  . Sertraline Hcl     Severe Headache  . Sulfa Drugs Cross Reactors Hives and Rash    Hives and rash    Antimicrobials this admission: 3/14 Aztreonam>> 3/14 Vancomycin x1 in the ED  Thank you for allowing pharmacy to be a part of this patient's care.  Jaiceon Collister C. MLennox Grumbles PharmD Pharmacy Resident  Pager: [phone]/14/2017 8:00 PM

## 2016-02-12 NOTE — ED Provider Notes (Addendum)
CSN: [id number]     Arrival date & time 02/12/16  1250 History   First MD Initiated Contact with Patient 02/12/16 1909     Chief Complaint  Patient presents with  . bilateral lower extremity swelling      (Consider location/radiation/quality/duration/timing/severity/associated sxs/prior Treatment) HPI Comments: Patient presents with cough and pedal. She has a history of recurrent cavitary pneumonia. She was recently hospitalized from February 8 to February 14 with pneumonia and treated with vancomycin and Azactam. She then followed up with her pulmonologist on March 6 and was restarted on Levaquin. She's finished that course of Levaquin. She notes that over the last 2 weeks she's had increased swelling to her lower extremities. She states that she stopped taking her Lasix about 2 weeks ago. She's noted progressively increasing leg swelling since that time. She's also noted erythema to her lower extremities. She states over the last 2-3 days she's had some increased fatigue and is felt feverish but hasn't checked her temperature. She has ongoing cough which is chronic for her. She denies any increase shortness of breath. She denies any chest pain. She had some vomiting yesterday but has been able to keep stuff down today.   Past Medical History  Diagnosis Date  . Hypertension     essential hypertension  . Tobacco abuse     smoking about 1/2 pk of cigarettes a day  . Anxiety   . Depression     psychiatrist Dr. Toy Care  . Emphysematous COPD ([place])     changes in right base along with patchy areas on last x-ray  . COPD (chronic obstructive pulmonary disease) ([place])   . History of echocardiogram 12/24/2006    Est. EF of 18-29% NORMAL LV SYSTOLIC FUNCTION WITH IMPAIRED RELAXATION -- MILD AORTIC SCLEROSIS -- NORMAL PALONARY ARTERY PRESSURE -- NO OLD ECHOS FOR COMPARISON -- [name], MD  . History of cardiovascular stress test 08/15/2004    EF of 70% -- Normal stress cardiolite.  There is no  evidence of ischemia and there is normal LV function. -- [name]. MD  . Diastolic dysfunction   . Allergy   . Asthma   . Heart murmur   . Pneumonia     "several times since May 2015" (07/06/2014)  . Chronic bronchitis ([place])     "get it alot; maybe not q yr" (07/06/2014)  . GERD (gastroesophageal reflux disease)    Past Surgical History  Procedure Laterality Date  . Tubal ligation  1986   Family History  Problem Relation Age of Onset  . Stroke Mother   . Heart disease Father   . Hyperlipidemia Father   . Hypertension Father   . Breast cancer Maternal Aunt   . Asthma Maternal Grandmother    Social History  Substance Use Topics  . Smoking status: Current Every Day Smoker -- 1.00 packs/day for 42 years    Types: Cigarettes    Last Attempt to Quit: 10/18/2015  . Smokeless tobacco: Never Used  . Alcohol Use: 0.0 oz/week    0 Standard drinks or equivalent per week     Comment: sicial   OB History    No data available     Review of Systems  Constitutional: Positive for fatigue. Negative for fever, chills and diaphoresis.  HENT: Negative for congestion, rhinorrhea and sneezing.   Eyes: Negative.   Respiratory: Positive for cough and shortness of breath. Negative for chest tightness.   Cardiovascular: Positive for leg swelling. Negative for chest pain.  Gastrointestinal: Positive for nausea and vomiting. Negative for abdominal pain, diarrhea and blood in stool.  Genitourinary: Negative for frequency, hematuria, flank pain and difficulty urinating.  Musculoskeletal: Negative for back pain and arthralgias.  Skin: Negative for rash.  Neurological: Negative for dizziness, speech difficulty, weakness, numbness and headaches.      Allergies  Ceclor; Escitalopram oxalate; Sertraline hcl; and Sulfa drugs cross reactors  Home Medications   Prior to Admission medications   Medication Sig Start Date End Date Taking? Authorizing Provider  albuterol (PROVENTIL) (2.5  MG/3ML) 0.083% nebulizer solution Take 3 mLs (2.5 mg total) by nebulization every 6 (six) hours as needed for wheezing or shortness of breath. 01/17/16  Yes Thao P Le, DO  ALPRAZolam (XANAX) 0.5 MG tablet Take 0.5 mg by mouth at bedtime.    Yes Historical Provider, MD  benzonatate (TESSALON) 100 MG capsule TAKE 1 TO 2 CAPSULES BY MOUTH 3 TIMES DAILY AS NEEDED FOR COUGH 01/17/16  Yes Thao P Le, DO  divalproex (DEPAKOTE ER) 500 MG 24 hr tablet Take 500 mg by mouth at bedtime.  02/22/12  Yes Historical Provider, MD  fluticasone (FLONASE) 50 MCG/ACT nasal spray Place 2 sprays into both nostrils daily.   Yes Historical Provider, MD  guaiFENesin (MUCINEX) 600 MG 12 hr tablet Take 1 tablet (600 mg total) by mouth 2 (two) times daily. 11/08/15  Yes Reyne Dumas, MD  HYDROcodone-acetaminophen (NORCO/VICODIN) 5-325 MG tablet Take 1 tablet by mouth every 6 (six) hours as needed for moderate pain. Patient taking differently: Take 0.5-1 tablets by mouth every 6 (six) hours as needed for moderate pain.  12/12/15  Yes Wardell Honour, MD  ipratropium (ATROVENT) 0.02 % nebulizer solution Take 2.5 mLs (0.5 mg total) by nebulization every 6 (six) hours as needed for wheezing or shortness of breath. 01/17/16  Yes Thao P Le, DO  ipratropium (ATROVENT) 0.03 % nasal spray Place 2 sprays into both nostrils 2 (two) times daily. Reported on 12/19/2015 07/16/15  Yes Historical Provider, MD  nadolol (CORGARD) 40 MG tablet Take 20 mg by mouth daily.  09/28/15  Yes Historical Provider, MD  nortriptyline (PAMELOR) 50 MG capsule Take 50 mg by mouth at bedtime. 11/12/15  Yes Historical Provider, MD  sodium chloride HYPERTONIC 3 % nebulizer solution Take by nebulization as needed for other. 02/04/16  Yes Brand Males, MD  tiZANidine (ZANAFLEX) 4 MG tablet Take 1 tablet (4 mg total) by mouth every 8 (eight) hours as needed for muscle spasms. 12/12/15  Yes Wardell Honour, MD  furosemide (LASIX) 20 MG tablet TAKE 2 TABLETS BY MOUTH EVERY  DAY Patient taking differently: Take 20 mg by mouth daily 05/25/15   [name], MD  levofloxacin (LEVAQUIN) 500 MG tablet Take 1 tablet (500 mg total) by mouth daily. Patient not taking: Reported on 02/12/2016 02/04/16   Brand Males, MD  potassium chloride (KLOR-CON) 20 MEQ packet Take 2 tablets by mouth once a day Patient not taking: Reported on 02/12/2016 02/05/16   Brand Males, MD  predniSONE (DELTASONE) 10 MG tablet 40 mg x1 day, then 30 mg x1 day, then 20 mg x1 day, then 10 mg x1 day, and then 5 mg x1 day and stop 02/04/16   Brand Males, MD  umeclidinium-vilanterol (ANORO ELLIPTA) 62.5-25 MCG/INH AEPB Inhale 1 puff into the lungs daily. 01/24/16   Tammy S Parrett, NP   BP 110/83 mmHg  Pulse 102  Temp(Src) 99 F (37.2 C) (Oral)  Resp 20  Ht '5\' 3"'$  (1.6 m)  Wt 130 lb (58.968 kg)  BMI 23.03 kg/m2  SpO2 97% Physical Exam  Constitutional: She is oriented to person, place, and time. She appears well-developed and well-nourished.  HENT:  Head: Normocephalic and atraumatic.  Eyes: Pupils are equal, round, and reactive to light.  Neck: Normal range of motion. Neck supple.  Cardiovascular: Normal rate, regular rhythm and normal heart sounds.   Pulmonary/Chest: Effort normal. No respiratory distress. She has wheezes. She has rales. She exhibits no tenderness.  Abdominal: Soft. Bowel sounds are normal. There is no tenderness. There is no rebound and no guarding.  Musculoskeletal: Normal range of motion. She exhibits edema (2-3+ pitting edema bilaterally. She has some warmth and erythema to her right lower extremity).  Lymphadenopathy:    She has no cervical adenopathy.  Neurological: She is alert and oriented to person, place, and time.  Skin: Skin is warm and dry. No rash noted.  Psychiatric: She has a normal mood and affect.    ED Course  Procedures (including critical care time) Labs Review Labs Reviewed  BASIC METABOLIC PANEL - Abnormal; Notable for the following:     Sodium 129 (*)    Potassium 3.4 (*)    Chloride 91 (*)    Glucose, Bld 109 (*)    BUN <5 (*)    Calcium 8.5 (*)    All other components within normal limits  CBC - Abnormal; Notable for the following:    WBC 19.1 (*)    Hemoglobin 11.4 (*)    HCT 35.5 (*)    All other components within normal limits  BRAIN NATRIURETIC PEPTIDE - Abnormal; Notable for the following:    B Natriuretic Peptide 136.2 (*)    All other components within normal limits  I-STAT TROPOININ, ED  I-STAT CG4 LACTIC ACID, ED    Imaging Review Dg Chest 2 View  02/12/2016  CLINICAL DATA:  Lower extremity swelling for 2 weeks, beginning to leak fluid, RIGHT side chest pain under breast, productive cough for 1 week, history of necrotizing pneumonia question recurrent pneumonia, history smoking, asthma, COPD, GERD EXAM: CHEST  2 VIEW COMPARISON:  01/24/2016 FINDINGS: Enlargement of cardiac silhouette. Mediastinal contours and pulmonary vascularity normal. Increased RIGHT middle and RIGHT lower lobe infiltrates consistent with pneumonia. Central bronchitic changes. LEFT lung clear. No pleural effusion or pneumothorax. Bones demineralized. IMPRESSION: Increased RIGHT middle and RIGHT lower lobe infiltrates consistent with pneumonia. Electronically Signed   By: Lavonia Dana M.D.   On: 02/12/2016 14:01   I have personally reviewed and evaluated these images and lab results as part of my medical decision-making.   EKG Interpretation   Date/Time:  Tuesday February 12 2016 13:28:33 EDT Ventricular Rate:  97 PR Interval:  164 QRS Duration: 64 QT Interval:  342 QTC Calculation: 434 R Axis:   32 Text Interpretation:  Normal sinus rhythm Low voltage QRS Septal infarct ,  age undetermined Abnormal ECG Confirmed by Naiya Corral  MD, Orlie Cundari ([id number]) on  02/12/2016 8:04:08 PM      MDM   Final diagnoses:  HCAP (healthcare-associated pneumonia)  Pedal edema    Patient history of COPD and recurrent necrotizing pneumonia presents with  worsening pedal edema and increased cough with some mild hypoxia. She is oxygen requiring on 2 L/m. Her oxygen saturation saturations dropped down to 87-88% off the oxygen. Her chest x-ray looks like her previous pneumonia has worsened. She has an elevated white blood cell count. She has hyponatremia which is actually improved from her last values. Her  blood pressure stable. She's afebrile in the ED. She has some mild tachycardia. No significant tachypnea or increased work of breathing. She was treated with antibiotics for healthcare associated pneumonia. I will consult the hospice for admission. She currently goes to [place] urgent care but was recently admitted to the hospitalist service.  I spoke with Dr. Arnoldo Morale who will admit the patient to a MedSurg bed.  20:26 lactate came back positive.  Will start more aggressive fluid bolus.  abx already given.   Malvin Johns, MD 02/12/16 9971  Malvin Johns, MD 02/12/16 2227

## 2016-02-12 NOTE — Progress Notes (Signed)
   Subjective:    Patient ID: Catherine Kelley, female    DOB: 09-26-1951, 65 y.o.   MRN: [id number]  Chief Complaint  Patient presents with  . Edema    bilateral legs and feet  . Cough  . Neck Pain    HPI  43 yof. Presenting with above complaints.  Recently hospitalized with PNA, treated with levaquin and prednisone. Now past 2 weeks noticing increasing fluid in legs. Denies sob. Has also fallen twice at home past couple weeks. Review of labs from last week shows hyponatremia and hypokalemia. Her pulmonologist took her off her lasix and hyzaar recently.  Baseline mildly productive cough (hx recurrent pna).   Neck pain - neck stiff past few weeks as she has been napping in chair and neck getting twisted.   Review of Systems Denies cp, sob, orthopnea, PND, abd pain, n/v, fevers, chills.     Objective:   Physical Exam  Constitutional: She is oriented to person, place, and time. She appears well-developed and well-nourished.  Non-toxic appearance. She does not have a sickly appearance. She does not appear ill. No distress.  BP 122/72 mmHg  Pulse 97  Temp(Src) 98.6 F (37 C)  Resp 16  Wt   SpO2 91%   Neck: Hepatojugular reflux and JVD present. Carotid bruit is not present.  JVP to low neck with HJR to mid neck at 45 degrees.   Cardiovascular: Normal rate, regular rhythm and normal heart sounds.   4+ pitting edema into thighs bilaterally. Legs taut bilaterally from mid shin down.   Pulmonary/Chest: Effort normal. No tachypnea. She has no decreased breath sounds. She has no wheezes. She has no rhonchi. She has rales in the right lower field and the left lower field.  Bibasilar crackles.   Neurological: She is alert and oriented to person, place, and time.  Psychiatric: She has a normal mood and affect. Her speech is normal and behavior is normal.      Assessment & Plan:   Edema, unspecified type  Cough  Hyponatremia  Hypokalemia --Pt very volume up (LE edema, JVP) on exam  after prednisone burst and recently stopping home lasix, hyzaar --Will need to proceed to ED for further eval/diuresis as most recent K 3.3 along with hyponatremia --To ED by private vehicle  Julieta Gutting, PA-C Physician Assistant-Certified [facility]  02/12/2016 12:35 PM

## 2016-02-12 NOTE — H&P (Addendum)
[facility] Admission History and Physical       Catherine Kelley PJK:[id number] DOB: Jun 09, 1951 DOA: 02/12/2016  Referring physician: EDP PCP: [name], MD  Specialists:   Chief Complaint: SOB and Coughing  HPI: Catherine Kelley is a 65 y.o. female with a history of Necrotizing Pneumonia, COPD, and HTN who presents to the ED with complaints of worsening SOB, and Cough with fevers and Chills x 5 days.   She reports completing antibiotics (Levaquin) 1 week ago, but her sputum began to turn yellow and green.  She also reports having increased Edema of both legs over the past 2 weeks.   She was found to have a RML and RLL pneumonia and she was placed on IV Vancomycin and Aztreonam and referred for admission.      Review of Systems:  Constitutional: No Weight Loss, No Weight Gain, Night Sweats, Fevers, Chills, Dizziness, Light Headedness, Fatigue, or Generalized Weakness HEENT: No Headaches, Difficulty Swallowing,Tooth/Dental Problems,Sore Throat,  No Sneezing, Rhinitis, Ear Ache, Nasal Congestion, or Post Nasal Drip,  Cardio-vascular:  No Chest pain, Orthopnea, PND, +Edema in Lower Extremities, Anasarca, Dizziness, Palpitations  Resp: +Dyspnea, No DOE,  +Productive Cough, No Non-Productive Cough, No Hemoptysis, No Wheezing.    GI: No Heartburn, Indigestion, Abdominal Pain, Nausea, Vomiting, Diarrhea, Constipation, Hematemesis, Hematochezia, Melena, Change in Bowel Habits,  Loss of Appetite  GU: No Dysuria, No Change in Color of Urine, No Urgency or Urinary Frequency, No Flank pain.  Musculoskeletal: No Joint Pain or Swelling, No Decreased Range of Motion, No Back Pain.  Neurologic: No Syncope, No Seizures, Muscle Weakness, Paresthesia, Vision Disturbance or Loss, No Diplopia, No Vertigo, No Difficulty Walking,  Skin: No Rash or Lesions. Psych: No Change in Mood or Affect, No Depression or Anxiety, No Memory loss, No Confusion, or Hallucinations   Past Medical History  Diagnosis Date    . Hypertension     essential hypertension  . Tobacco abuse     smoking about 1/2 pk of cigarettes a day  . Anxiety   . Depression     psychiatrist Dr. Toy Care  . Emphysematous COPD ([place])     changes in right base along with patchy areas on last x-ray  . COPD (chronic obstructive pulmonary disease) ([place])   . History of echocardiogram 12/24/2006    Est. EF of 80-99% NORMAL LV SYSTOLIC FUNCTION WITH IMPAIRED RELAXATION -- MILD AORTIC SCLEROSIS -- NORMAL PALONARY ARTERY PRESSURE -- NO OLD ECHOS FOR COMPARISON -- [name], MD  . History of cardiovascular stress test 08/15/2004    EF of 70% -- Normal stress cardiolite.  There is no evidence of ischemia and there is normal LV function. -- [name]. MD  . Diastolic dysfunction   . Allergy   . Asthma   . Heart murmur   . Pneumonia     "several times since May 2015" (07/06/2014)  . Chronic bronchitis ([place])     "get it alot; maybe not q yr" (07/06/2014)  . GERD (gastroesophageal reflux disease)      Past Surgical History  Procedure Laterality Date  . Tubal ligation  1986      Prior to Admission medications   Medication Sig Start Date End Date Taking? Authorizing Provider  albuterol (PROVENTIL) (2.5 MG/3ML) 0.083% nebulizer solution Take 3 mLs (2.5 mg total) by nebulization every 6 (six) hours as needed for wheezing or shortness of breath. 01/17/16  Yes Thao P Le, DO  ALPRAZolam (XANAX) 0.5 MG tablet Take 0.5 mg by  mouth at bedtime.    Yes Historical Provider, MD  benzonatate (TESSALON) 100 MG capsule TAKE 1 TO 2 CAPSULES BY MOUTH 3 TIMES DAILY AS NEEDED FOR COUGH 01/17/16  Yes Thao P Le, DO  divalproex (DEPAKOTE ER) 500 MG 24 hr tablet Take 500 mg by mouth at bedtime.  02/22/12  Yes Historical Provider, MD  fluticasone (FLONASE) 50 MCG/ACT nasal spray Place 2 sprays into both nostrils daily.   Yes Historical Provider, MD  guaiFENesin (MUCINEX) 600 MG 12 hr tablet Take 1 tablet (600 mg total) by mouth 2 (two) times daily. 11/08/15   Yes Reyne Dumas, MD  HYDROcodone-acetaminophen (NORCO/VICODIN) 5-325 MG tablet Take 1 tablet by mouth every 6 (six) hours as needed for moderate pain. Patient taking differently: Take 0.5-1 tablets by mouth every 6 (six) hours as needed for moderate pain.  12/12/15  Yes Wardell Honour, MD  ipratropium (ATROVENT) 0.02 % nebulizer solution Take 2.5 mLs (0.5 mg total) by nebulization every 6 (six) hours as needed for wheezing or shortness of breath. 01/17/16  Yes Thao P Le, DO  ipratropium (ATROVENT) 0.03 % nasal spray Place 2 sprays into both nostrils 2 (two) times daily. Reported on 12/19/2015 07/16/15  Yes Historical Provider, MD  nadolol (CORGARD) 40 MG tablet Take 20 mg by mouth daily.  09/28/15  Yes Historical Provider, MD  nortriptyline (PAMELOR) 50 MG capsule Take 50 mg by mouth at bedtime. 11/12/15  Yes Historical Provider, MD  sodium chloride HYPERTONIC 3 % nebulizer solution Take by nebulization as needed for other. 02/04/16  Yes Brand Males, MD  tiZANidine (ZANAFLEX) 4 MG tablet Take 1 tablet (4 mg total) by mouth every 8 (eight) hours as needed for muscle spasms. 12/12/15  Yes Wardell Honour, MD  furosemide (LASIX) 20 MG tablet TAKE 2 TABLETS BY MOUTH EVERY DAY Patient taking differently: Take 20 mg by mouth daily 05/25/15   [name], MD  levofloxacin (LEVAQUIN) 500 MG tablet Take 1 tablet (500 mg total) by mouth daily. Patient not taking: Reported on 02/12/2016 02/04/16   Brand Males, MD  potassium chloride (KLOR-CON) 20 MEQ packet Take 2 tablets by mouth once a day Patient not taking: Reported on 02/12/2016 02/05/16   Brand Males, MD  predniSONE (DELTASONE) 10 MG tablet 40 mg x1 day, then 30 mg x1 day, then 20 mg x1 day, then 10 mg x1 day, and then 5 mg x1 day and stop 02/04/16   Brand Males, MD  umeclidinium-vilanterol (ANORO ELLIPTA) 62.5-25 MCG/INH AEPB Inhale 1 puff into the lungs daily. 01/24/16   Melvenia Needles, NP     Allergies  Allergen Reactions  . Ceclor  [Cefaclor] Hives  . Escitalopram Oxalate     Pt does not recall ever taking medication  . Sertraline Hcl     Severe Headache  . Sulfa Drugs Cross Reactors Hives and Rash    Hives and rash    Social History:  reports that she has been smoking Cigarettes.  She has a 42 pack-year smoking history. She has never used smokeless tobacco. She reports that she drinks alcohol. She reports that she does not use illicit drugs.    Family History  Problem Relation Age of Onset  . Stroke Mother   . Heart disease Father   . Hyperlipidemia Father   . Hypertension Father   . Breast cancer Maternal Aunt   . Asthma Maternal Grandmother        Physical Exam:  GEN:  Pleasant Thin ill appearing Elderly  65 y.o. Caucasian female examined and in no acute distress; cooperative with exam Filed Vitals:   02/12/16 1316 02/12/16 1933  BP: 110/83   Pulse: 102   Temp: 99 F (37.2 C)   TempSrc: Oral   Resp: 20   Height: '5\' 3"'$  (1.6 m)   Weight: 58.968 kg (130 lb)   SpO2: 99% 97%   Blood pressure 110/83, pulse 102, temperature 99 F (37.2 C), temperature source Oral, resp. rate 20, height '5\' 3"'$  (1.6 m), weight 58.968 kg (130 lb), SpO2 97 %. PSYCH: She is alert and oriented x4; does not appear anxious does not appear depressed; affect is normal HEENT: Normocephalic and Atraumatic, Mucous membranes pink; PERRLA; EOM intact; Fundi:  Benign;  No scleral icterus, Nares: Patent, Oropharynx: Clear, Fair Dentition,    Neck:  FROM, No Cervical Lymphadenopathy nor Thyromegaly or Carotid Bruit; No JVD; Breasts:: Not examined CHEST WALL: No tenderness CHEST: Decreased Breath Sounds Diffuse Rhonchi  HEART: Regular rate and rhythm; no murmurs rubs or gallops BACK: No kyphosis or scoliosis; No CVA tenderness ABDOMEN: Positive Bowel Sounds, Scaphoid, Soft Non-Tender, No Rebound or Guarding; No Masses, No Organomegaly. Rectal Exam: Not done EXTREMITIES: No Cyanosis, Clubbing, 2+ BLE Edema; No Ulcerations. Genitalia:  not examined PULSES: 2+ and symmetric SKIN: Normal hydration no rash or ulceration CNS:  Alert and Oriented x 4, No Focal Deficits Vascular: pulses palpable throughout    Labs on Admission:  Basic Metabolic Panel:  Recent Labs Lab 02/12/16 1335  NA 129*  K 3.4*  CL 91*  CO2 28  GLUCOSE 109*  BUN <5*  CREATININE 0.44  CALCIUM 8.5*   Liver Function Tests: No results for input(s): AST, ALT, ALKPHOS, BILITOT, PROT, ALBUMIN in the last 168 hours. No results for input(s): LIPASE, AMYLASE in the last 168 hours. No results for input(s): AMMONIA in the last 168 hours. CBC:  Recent Labs Lab 02/12/16 1335  WBC 19.1*  HGB 11.4*  HCT 35.5*  MCV 85.7  PLT 391   Cardiac Enzymes: No results for input(s): CKTOTAL, CKMB, CKMBINDEX, TROPONINI in the last 168 hours.  BNP (last 3 results)  Recent Labs  02/12/16 1335  BNP 136.2*    ProBNP (last 3 results) No results for input(s): PROBNP in the last 8760 hours.  CBG: No results for input(s): GLUCAP in the last 168 hours.  Radiological Exams on Admission: Dg Chest 2 View  02/12/2016  CLINICAL DATA:  Lower extremity swelling for 2 weeks, beginning to leak fluid, RIGHT side chest pain under breast, productive cough for 1 week, history of necrotizing pneumonia question recurrent pneumonia, history smoking, asthma, COPD, GERD EXAM: CHEST  2 VIEW COMPARISON:  01/24/2016 FINDINGS: Enlargement of cardiac silhouette. Mediastinal contours and pulmonary vascularity normal. Increased RIGHT middle and RIGHT lower lobe infiltrates consistent with pneumonia. Central bronchitic changes. LEFT lung clear. No pleural effusion or pneumothorax. Bones demineralized. IMPRESSION: Increased RIGHT middle and RIGHT lower lobe infiltrates consistent with pneumonia. Electronically Signed   By: Lavonia Dana M.D.   On: 02/12/2016 14:01     EKG: Independently reviewed. Normal Sinus Rhythm Rate = 97 Previous Septal Infarct Changes       Assessment/Plan:       65 y.o. female with  Principal Problem:    HCAP (healthcare-associated pneumonia)    IV Vancomycin and Aztreonam    DuoNebs    O2   Active Problems:    Acute respiratory failure with hypoxia (HCC)    O2    Monitor O2 sats  Sepsis    Sepsis Protocol    Monitor Lactate levels      Hyponatremia- suspect Fluid  Overload    Send Urine OSM and Urine Electrolytes      COPD GOLD II/ still smoking    Duonebs      Essential hypertension    Monitor BPs    On Lasix     Diastolic Dysfunction    On Lasix             Tobacco abuse    Counseled and encouraged to continue to decrease       DVT Prophylaxis    Lovenox     Code Status:     FULL CODE      Family Communication:   No Family Present    Disposition Plan:    Inpatient  Status      Time spent: 24 Minutes      Theressa Millard [facility] Pager [phone]   If [place] Please Contact the Day Rounding Team MD for [facility]  If 7PM-7AM, Please Contact Night-Floor Coverage  [email or web address] Password [email or web address] 02/12/2016, 9:02 PM     ADDENDUM:   Patient was seen and examined on 02/12/2016

## 2016-02-13 ENCOUNTER — Encounter (HOSPITAL_COMMUNITY): Payer: Self-pay | Admitting: General Practice

## 2016-02-13 DIAGNOSIS — E871 Hypo-osmolality and hyponatremia: Secondary | ICD-10-CM

## 2016-02-13 DIAGNOSIS — I10 Essential (primary) hypertension: Secondary | ICD-10-CM

## 2016-02-13 DIAGNOSIS — J9601 Acute respiratory failure with hypoxia: Secondary | ICD-10-CM

## 2016-02-13 DIAGNOSIS — J189 Pneumonia, unspecified organism: Secondary | ICD-10-CM

## 2016-02-13 LAB — URINALYSIS, ROUTINE W REFLEX MICROSCOPIC
BILIRUBIN URINE: NEGATIVE
GLUCOSE, UA: NEGATIVE mg/dL
HGB URINE DIPSTICK: NEGATIVE
KETONES UR: NEGATIVE mg/dL
Nitrite: NEGATIVE
PROTEIN: NEGATIVE mg/dL
Specific Gravity, Urine: 1.007 (ref 1.005–1.030)
pH: 7 (ref 5.0–8.0)

## 2016-02-13 LAB — STREP PNEUMONIAE URINARY ANTIGEN: STREP PNEUMO URINARY ANTIGEN: NEGATIVE

## 2016-02-13 LAB — CBC
HCT: 32.1 % — ABNORMAL LOW (ref 36.0–46.0)
HEMOGLOBIN: 10.4 g/dL — AB (ref 12.0–15.0)
MCH: 27.5 pg (ref 26.0–34.0)
MCHC: 32.4 g/dL (ref 30.0–36.0)
MCV: 84.9 fL (ref 78.0–100.0)
PLATELETS: 351 10*3/uL (ref 150–400)
RBC: 3.78 MIL/uL — ABNORMAL LOW (ref 3.87–5.11)
RDW: 15.4 % (ref 11.5–15.5)
WBC: 13.6 10*3/uL — ABNORMAL HIGH (ref 4.0–10.5)

## 2016-02-13 LAB — URINE MICROSCOPIC-ADD ON

## 2016-02-13 LAB — BASIC METABOLIC PANEL
Anion gap: 10 (ref 5–15)
CALCIUM: 7.8 mg/dL — AB (ref 8.9–10.3)
CO2: 28 mmol/L (ref 22–32)
CREATININE: 0.39 mg/dL — AB (ref 0.44–1.00)
Chloride: 93 mmol/L — ABNORMAL LOW (ref 101–111)
GFR calc Af Amer: >60 mL/min (ref >60)
GLUCOSE: 76 mg/dL (ref 65–99)
POTASSIUM: 2.8 mmol/L — AB (ref 3.5–5.1)
Sodium: 131 mmol/L — ABNORMAL LOW (ref 135–145)

## 2016-02-13 LAB — I-STAT CG4 LACTIC ACID, ED: LACTIC ACID, VENOUS: 1.68 mmol/L (ref 0.5–2.0)

## 2016-02-13 MED ORDER — POTASSIUM CHLORIDE CRYS ER 20 MEQ PO TBCR
30.0000 meq | EXTENDED_RELEASE_TABLET | ORAL | Status: AC
  Administered 2016-02-13 (×2): 30 meq via ORAL
  Filled 2016-02-13: qty 2

## 2016-02-13 MED ORDER — POTASSIUM CHLORIDE CRYS ER 20 MEQ PO TBCR
EXTENDED_RELEASE_TABLET | ORAL | Status: AC
  Administered 2016-02-13: 11:00:00
  Filled 2016-02-13: qty 2

## 2016-02-13 MED ORDER — ENSURE ENLIVE PO LIQD
237.0000 mL | Freq: Two times a day (BID) | ORAL | Status: DC
  Administered 2016-02-13 – 2016-02-16 (×6): 237 mL via ORAL

## 2016-02-13 MED ORDER — IPRATROPIUM-ALBUTEROL 0.5-2.5 (3) MG/3ML IN SOLN
3.0000 mL | Freq: Two times a day (BID) | RESPIRATORY_TRACT | Status: DC
  Administered 2016-02-13 – 2016-02-16 (×5): 3 mL via RESPIRATORY_TRACT
  Filled 2016-02-13 (×6): qty 3

## 2016-02-13 MED ORDER — ALPRAZOLAM 0.5 MG PO TABS
0.5000 mg | ORAL_TABLET | Freq: Every day | ORAL | Status: DC
  Administered 2016-02-13 – 2016-02-15 (×3): 0.5 mg via ORAL
  Filled 2016-02-13 (×3): qty 1

## 2016-02-13 NOTE — ED Notes (Signed)
Paged admitting regarding pt K+ 2.8

## 2016-02-13 NOTE — Progress Notes (Signed)
Pt admitted to the unit at 1149. Pt mental status is A&O. Pt oriented to room, staff, and call bell. Skin is intact except where otherwise charted. Full assessment charted in [facility]. Call bell within reach. Visitor guidelines reviewed w/ pt and/or family.

## 2016-02-13 NOTE — Progress Notes (Signed)
Rt gave pt flutter valve. Pt knows and understand how to use.

## 2016-02-13 NOTE — ED Notes (Signed)
Admitting was paged again regarding pt K of 2.8 Waiting for response from [place].

## 2016-02-13 NOTE — Progress Notes (Signed)
UR COMPLETED  

## 2016-02-13 NOTE — Progress Notes (Signed)
[facility] PROGRESS NOTE  Catherine Kelley HFW:[id number] DOB: Sep 19, 1951 DOA: 02/12/2016 PCP: [name], MD  HPI/Brief narrative 64 y.o. female with a history of Necrotizing Pneumonia, COPD, and HTN who presents to the ED with complaints of worsening SOB, and Cough with fevers and Chills x 5 days. She reports completing antibiotics (Levaquin) 1 week ago, but her sputum began to turn yellow and green. She also reports having increased Edema of both legs over the past 2 weeks. She was found to have a RML and RLL pneumonia and she was placed on IV Vancomycin and Aztreonam and referred for admission  Assessment/Plan: HCAP with sepsis on admission (healthcare-associated pneumonia) - Pt was started on empiric IV Vancomycin and Aztreonam, will continue - ContinueDuoNebs - Leukocytosis is improving. Afebrile  Acute respiratory failure with hypoxia (HCC) - Cont to wean O2 as tolerated  Hyponatremia- suspect Fluid Overload vs related to PNA - Improved overnight - Cont to monitor  COPD GOLD II/ still smoking - Cont Duonebs as needed  Esential hypertension - BP currently stable and controlled - On Lasix   Diastolic Dysfunction, chronic - On Lasix  - Clinically euvolemic at present  Tobacco abuse - Cessation was done  DVT Prophylaxis - Continue subqQ Lovenox  Code Status: Full Family Communication: Pt in room Disposition Plan: Possible d/c in 72hrs   Consultants:    Procedures:    Antibiotics: Anti-infectives    Start     Dose/Rate Route Frequency Ordered Stop   02/13/16 1000  vancomycin (VANCOCIN) 500 mg in sodium chloride 0.9 % 100 mL IVPB     500 mg 100 mL/hr over 60 Minutes Intravenous Every 12 hours 02/12/16 2323     02/13/16 0600  aztreonam (AZACTAM) 2 g in dextrose 5 % 50 mL IVPB     2 g 100 mL/hr over 30 Minutes Intravenous 3 times per day 02/12/16 2248 02/21/16 0559   02/12/16 2200  aztreonam (AZACTAM) 1 g in dextrose 5  % 50 mL IVPB  Status:  Discontinued     1 g 100 mL/hr over 30 Minutes Intravenous 3 times per day 02/12/16 2001 02/13/16 0445   02/12/16 1945  vancomycin (VANCOCIN) IVPB 1000 mg/200 mL premix     1,000 mg 200 mL/hr over 60 Minutes Intravenous  Once 02/12/16 1942 02/12/16 2103      HPI/Subjective: No complaints this AM  Objective: Filed Vitals:   02/13/16 1153 02/13/16 1244 02/13/16 1247 02/13/16 1403  BP:    102/45  Pulse:    85  Temp:    97.7 F (36.5 C)  TempSrc:    Oral  Resp:    22  Height: '5\' 3"'$  (1.6 m)     Weight: 60 kg (132 lb 4.4 oz)     SpO2:  93% 93% 99%    Intake/Output Summary (Last 24 hours) at 02/13/16 1716 Last data filed at 02/13/16 1603  Gross per 24 hour  Intake   2983 ml  Output    700 ml  Net   2283 ml   Filed Weights   02/12/16 1316 02/13/16 1153  Weight: 58.968 kg (130 lb) 60 kg (132 lb 4.4 oz)    Exam:   General:  Awake, in nad  Cardiovascular: regular, s1, s2  Respiratory: normal resp effort, no wheezing  Abdomen: soft,nondistended  Musculoskeletal: perfused, no clubbing   Data Reviewed: Basic Metabolic Panel:  Recent Labs Lab 02/12/16 1335 02/13/16 0151  NA 129* 131*  K 3.4* 2.8*  CL 91* 93*  CO2 28 28  GLUCOSE 109* 76  BUN <5* <5*  CREATININE 0.44 0.39*  CALCIUM 8.5* 7.8*   Liver Function Tests: No results for input(s): AST, ALT, ALKPHOS, BILITOT, PROT, ALBUMIN in the last 168 hours. No results for input(s): LIPASE, AMYLASE in the last 168 hours. No results for input(s): AMMONIA in the last 168 hours. CBC:  Recent Labs Lab 02/12/16 1335 02/13/16 0151  WBC 19.1* 13.6*  HGB 11.4* 10.4*  HCT 35.5* 32.1*  MCV 85.7 84.9  PLT 391 351   Cardiac Enzymes: No results for input(s): CKTOTAL, CKMB, CKMBINDEX, TROPONINI in the last 168 hours. BNP (last 3 results)  Recent Labs  02/12/16 1335  BNP 136.2*    ProBNP (last 3 results) No results for input(s): PROBNP in the last 8760 hours.  CBG: No results for  input(s): GLUCAP in the last 168 hours.  Recent Results (from the past 240 hour(s))  Respiratory or Resp and Sputum Culture     Status: None   Collection Time: 02/04/16  4:13 PM  Result Value Ref Range Status   Gram Stain Abundant  Final   Gram Stain WBC present-both PMN and Mononuclear  Final   Gram Stain No Squamous Epithelial Cells Seen  Final   Gram Stain Abundant Gram Negative Rods  Final   Gram Stain Few Gram Positive Cocci In Pairs  Final   Organism ID, Bacteria Normal Oropharyngeal Flora  Final     Studies: Dg Chest 2 View  02/12/2016  CLINICAL DATA:  Lower extremity swelling for 2 weeks, beginning to leak fluid, RIGHT side chest pain under breast, productive cough for 1 week, history of necrotizing pneumonia question recurrent pneumonia, history smoking, asthma, COPD, GERD EXAM: CHEST  2 VIEW COMPARISON:  01/24/2016 FINDINGS: Enlargement of cardiac silhouette. Mediastinal contours and pulmonary vascularity normal. Increased RIGHT middle and RIGHT lower lobe infiltrates consistent with pneumonia. Central bronchitic changes. LEFT lung clear. No pleural effusion or pneumothorax. Bones demineralized. IMPRESSION: Increased RIGHT middle and RIGHT lower lobe infiltrates consistent with pneumonia. Electronically Signed   By: Lavonia Dana M.D.   On: 02/12/2016 14:01    Scheduled Meds: . ALPRAZolam  0.5 mg Oral QHS  . aztreonam  2 g Intravenous 3 times per day  . divalproex  500 mg Oral QHS  . enoxaparin (LOVENOX) injection  40 mg Subcutaneous Q24H  . feeding supplement (ENSURE ENLIVE)  237 mL Oral BID BM  . guaiFENesin  600 mg Oral BID  . ipratropium-albuterol  3 mL Nebulization BID  . nadolol  20 mg Oral Daily  . nortriptyline  50 mg Oral QHS  . potassium chloride SA  20 mEq Oral Daily  . sodium chloride flush  3 mL Intravenous Q12H  . vancomycin  500 mg Intravenous Q12H   Continuous Infusions:   Principal Problem:   HCAP (healthcare-associated pneumonia) Active Problems:    Acute respiratory failure with hypoxia (HCC)   Hyponatremia   COPD GOLD II/ still smoking   Essential hypertension   Tobacco abuse   Sepsis ([place])   Catherine Kelley, [facility] Pager [phone]. If 7PM-7AM, please contact night-coverage at [email or web address], password [facility] 02/13/2016, 5:16 PM  LOS: 1 day

## 2016-02-13 NOTE — Progress Notes (Signed)
RN charted airborne precautions in Pt chart, may have been a mistake. Per infection prevention Matt Holmes, there is no evidence showing Pt needs this precaution.

## 2016-02-14 DIAGNOSIS — J449 Chronic obstructive pulmonary disease, unspecified: Secondary | ICD-10-CM

## 2016-02-14 LAB — URINE CULTURE

## 2016-02-14 LAB — CBC
HCT: 30.4 % — ABNORMAL LOW (ref 36.0–46.0)
Hemoglobin: 9.6 g/dL — ABNORMAL LOW (ref 12.0–15.0)
MCH: 27.4 pg (ref 26.0–34.0)
MCHC: 31.6 g/dL (ref 30.0–36.0)
MCV: 86.9 fL (ref 78.0–100.0)
PLATELETS: 281 10*3/uL (ref 150–400)
RBC: 3.5 MIL/uL — ABNORMAL LOW (ref 3.87–5.11)
RDW: 15.6 % — AB (ref 11.5–15.5)
WBC: 6.3 10*3/uL (ref 4.0–10.5)

## 2016-02-14 LAB — MAGNESIUM: Magnesium: 1.6 mg/dL — ABNORMAL LOW (ref 1.7–2.4)

## 2016-02-14 LAB — BASIC METABOLIC PANEL
Anion gap: 9 (ref 5–15)
CALCIUM: 7.9 mg/dL — AB (ref 8.9–10.3)
CHLORIDE: 100 mmol/L — AB (ref 101–111)
CO2: 25 mmol/L (ref 22–32)
CREATININE: 0.38 mg/dL — AB (ref 0.44–1.00)
GFR calc Af Amer: >60 mL/min (ref >60)
GFR calc non Af Amer: >60 mL/min (ref >60)
Glucose, Bld: 100 mg/dL — ABNORMAL HIGH (ref 65–99)
Potassium: 3.9 mmol/L (ref 3.5–5.1)
SODIUM: 134 mmol/L — AB (ref 135–145)

## 2016-02-14 MED ORDER — FLUCONAZOLE 100 MG PO TABS
150.0000 mg | ORAL_TABLET | Freq: Once | ORAL | Status: AC
  Administered 2016-02-14: 150 mg via ORAL
  Filled 2016-02-14: qty 2

## 2016-02-14 MED ORDER — MAGNESIUM SULFATE 2 GM/50ML IV SOLN
2.0000 g | Freq: Once | INTRAVENOUS | Status: AC
  Administered 2016-02-14: 2 g via INTRAVENOUS
  Filled 2016-02-14: qty 50

## 2016-02-14 MED ORDER — CEFTAZIDIME 1 G IJ SOLR
1.0000 g | Freq: Three times a day (TID) | INTRAMUSCULAR | Status: DC
  Administered 2016-02-14 – 2016-02-15 (×4): 1 g via INTRAVENOUS
  Filled 2016-02-14 (×6): qty 1

## 2016-02-14 MED ORDER — DIPHENHYDRAMINE-ZINC ACETATE 2-0.1 % EX CREA
TOPICAL_CREAM | Freq: Three times a day (TID) | CUTANEOUS | Status: DC | PRN
  Administered 2016-02-14: 23:00:00 via TOPICAL
  Filled 2016-02-14 (×3): qty 28

## 2016-02-14 MED ORDER — POLYETHYLENE GLYCOL 3350 17 G PO PACK
17.0000 g | PACK | Freq: Two times a day (BID) | ORAL | Status: DC
  Administered 2016-02-14 – 2016-02-16 (×4): 17 g via ORAL
  Filled 2016-02-14 (×5): qty 1

## 2016-02-14 NOTE — Progress Notes (Signed)
Initial Nutrition Assessment  DOCUMENTATION CODES:   Non-severe (moderate) malnutrition in context of chronic illness  INTERVENTION:   -Continue Ensure Enlive po BID, each supplement provides 350 kcal and 20 grams of protein  NUTRITION DIAGNOSIS:   Malnutrition related to chronic illness as evidenced by mild depletion of body fat, mild depletion of muscle mass.  GOAL:   Patient will meet greater than or equal to 90% of their needs  MONITOR:   PO intake, Supplement acceptance, Labs, Weight trends, Skin, I & O's  REASON FOR ASSESSMENT:   Malnutrition Screening Tool    ASSESSMENT:   Catherine Kelley is a 65 y.o. female with a history of Necrotizing Pneumonia, COPD, and HTN who presents to the ED with complaints of worsening SOB, and Cough with fevers and Chills x 5 days. She reports completing antibiotics (Levaquin) 1 week ago, but her sputum began to turn yellow and green. She also reports having increased Edema of both legs over the past 2 weeks. She was found to have a RML and RLL pneumonia and she was placed on IV Vancomycin and Aztreonam and referred for admission.   Pt admitted with HCAP.   Spoke with pt, who was sitting in recliner at bedside. She reports her appetite is improving and has been doing well over the past month. She reveals that she typically consumes 3 meals per day (Breakfast: eggs and toast, Lunch: sandwich with banana and coke, Dinner: meat, starch, and vegetable). Meal completion 80%. Pt reports she has difficulty chewing hard meats because she is missing her molars, but denies any difficulty tolerating hospital foods  Pt endorses weight loss due to previous hospitalization with pneumonia (where she claims she lost 13# in one week), however, she reports she is steadily gaining her weight back. She reveals her UBW is around 137#.   Pt consuming Ensure supplement at time of visit. She reveals she drinks them at home intermittently and is amenable to  continuing supplements while in the hospital. Discussed importance of good meal and supplement intake to promote healing.   Nutrition-Focused physical exam completed. Findings are mild fat depletion, mild muscle depletion, and mild edema.    Labs reviewed: Na: 134, Mg: 1.6.  Diet Order:  Diet Heart Room service appropriate?: Yes; Fluid consistency:: Thin  Skin:  Reviewed, no issues  Last BM:  02/11/16  Height:   Ht Readings from Last 1 Encounters:  02/13/16 '5\' 3"'$  (1.6 m)    Weight:   Wt Readings from Last 1 Encounters:  02/13/16 132 lb 4.4 oz (60 kg)    Ideal Body Weight:  52.2 kg  BMI:  Body mass index is 23.44 kg/(m^2).  Estimated Nutritional Needs:   Kcal:  1600-1800  Protein:  70-85 grams  Fluid:  1.6-1.8 L  EDUCATION NEEDS:   Education needs addressed  Chenika Nevils A. Jimmye Norman, RD, LDN, CDE Pager: [phone] After hours Pager: [phone]

## 2016-02-14 NOTE — Progress Notes (Signed)
[facility] PROGRESS NOTE  Catherine Kelley QAS:[id number] DOB: Oct 15, 1951 DOA: 02/12/2016 PCP: [name], MD  HPI/Brief narrative 65 y.o. female with a history of Necrotizing Pneumonia, COPD, and HTN who presents to the ED with complaints of worsening SOB, and Cough with fevers and Chills x 5 days. She reports completing antibiotics (Levaquin) 1 week ago, but her sputum began to turn yellow and green. She also reports having increased Edema of both legs over the past 2 weeks. She was found to have a RML and RLL pneumonia and she was placed on IV Vancomycin and Aztreonam and referred for admission  Assessment/Plan: HCAP with sepsis on admission (healthcare-associated pneumonia) - Pt was started on empiric IV Vancomycin and Aztreonam, change to vanc w/ cefepime per order set. Pt reportedly tolerated cephalosporins in past - ContinueDuoNebs - Leukocytosis normalized. Afebrile - Cont flutter valve  Acute respiratory failure with hypoxia (HCC) - Cont to wean O2 as tolerated  Hyponatremia- suspect Fluid Overload vs related to PNA - Improved - Cont to monitor  COPD GOLD II/ still smoking - Cont Duonebs as needed  Esential hypertension - BP currently stable and controlled - On Lasix   Diastolic Dysfunction, chronic - Remains on Lasix  - Clinically euvolemic at present  Tobacco abuse - Cessation was done  DVT Prophylaxis - Continue subqQ Lovenox  Code Status: Full Family Communication: Pt in room Disposition Plan: Possible d/c in 72hrs, pt/ot consulted for d/c planning   Consultants:    Procedures:    Antibiotics: Anti-infectives    Start     Dose/Rate Route Frequency Ordered Stop   02/14/16 1300  fluconazole (DIFLUCAN) tablet 150 mg     150 mg Oral  Once 02/14/16 1243 02/14/16 1300   02/14/16 1200  cefTAZidime (FORTAZ) 1 g in dextrose 5 % 50 mL IVPB     1 g 100 mL/hr over 30 Minutes Intravenous 3 times per day 02/14/16 1112      02/13/16 1000  vancomycin (VANCOCIN) 500 mg in sodium chloride 0.9 % 100 mL IVPB     500 mg 100 mL/hr over 60 Minutes Intravenous Every 12 hours 02/12/16 2323     02/13/16 0600  aztreonam (AZACTAM) 2 g in dextrose 5 % 50 mL IVPB  Status:  Discontinued     2 g 100 mL/hr over 30 Minutes Intravenous 3 times per day 02/12/16 2248 02/14/16 1055   02/12/16 2200  aztreonam (AZACTAM) 1 g in dextrose 5 % 50 mL IVPB  Status:  Discontinued     1 g 100 mL/hr over 30 Minutes Intravenous 3 times per day 02/12/16 2001 02/13/16 0445   02/12/16 1945  vancomycin (VANCOCIN) IVPB 1000 mg/200 mL premix     1,000 mg 200 mL/hr over 60 Minutes Intravenous  Once 02/12/16 1942 02/12/16 2103      HPI/Subjective: Reports developing a "yeast infection" and requests antifungal  Objective: Filed Vitals:   02/14/16 0446 02/14/16 0829 02/14/16 0947 02/14/16 1257  BP: 107/52 118/61  93/80  Pulse: 85 80  95  Temp: 98.2 F (36.8 C) 97.5 F (36.4 C)  98.4 F (36.9 C)  TempSrc:  Oral  Oral  Resp: '16 16  19  '$ Height:      Weight:      SpO2: 100% 99% 97% 99%    Intake/Output Summary (Last 24 hours) at 02/14/16 1709 Last data filed at 02/14/16 1000  Gross per 24 hour  Intake    700 ml  Output  0 ml  Net    700 ml   Filed Weights   02/12/16 1316 02/13/16 1153  Weight: 58.968 kg (130 lb) 60 kg (132 lb 4.4 oz)    Exam:   General:  Awake, in nad  Cardiovascular: regular, s1, s2  Respiratory: normal resp effort, no wheezing  Abdomen: soft,nondistended  Musculoskeletal: perfused, no clubbing, no cyanosis  Data Reviewed: Basic Metabolic Panel:  Recent Labs Lab 02/12/16 1335 02/13/16 0151 02/14/16 0520  NA 129* 131* 134*  K 3.4* 2.8* 3.9  CL 91* 93* 100*  CO2 '28 28 25  '$ GLUCOSE 109* 76 100*  BUN <5* <5* <5*  CREATININE 0.44 0.39* 0.38*  CALCIUM 8.5* 7.8* 7.9*  MG  --   --  1.6*   Liver Function Tests: No results for input(s): AST, ALT, ALKPHOS, BILITOT, PROT, ALBUMIN in the last 168  hours. No results for input(s): LIPASE, AMYLASE in the last 168 hours. No results for input(s): AMMONIA in the last 168 hours. CBC:  Recent Labs Lab 02/12/16 1335 02/13/16 0151 02/14/16 0520  WBC 19.1* 13.6* 6.3  HGB 11.4* 10.4* 9.6*  HCT 35.5* 32.1* 30.4*  MCV 85.7 84.9 86.9  PLT 391 351 281   Cardiac Enzymes: No results for input(s): CKTOTAL, CKMB, CKMBINDEX, TROPONINI in the last 168 hours. BNP (last 3 results)  Recent Labs  02/12/16 1335  BNP 136.2*    ProBNP (last 3 results) No results for input(s): PROBNP in the last 8760 hours.  CBG: No results for input(s): GLUCAP in the last 168 hours.  Recent Results (from the past 240 hour(s))  Blood Culture (routine x 2)     Status: None (Preliminary result)   Collection Time: 02/13/16  1:45 AM  Result Value Ref Range Status   Specimen Description BLOOD RIGHT FOREARM  Final   Special Requests BOTTLES DRAWN AEROBIC AND ANAEROBIC 10ML  Final   Culture NO GROWTH 1 DAY  Final   Report Status PENDING  Incomplete  Blood Culture (routine x 2)     Status: None (Preliminary result)   Collection Time: 02/13/16  1:51 AM  Result Value Ref Range Status   Specimen Description BLOOD RIGHT HAND  Final   Special Requests BOTTLES DRAWN AEROBIC AND ANAEROBIC 5ML  Final   Culture NO GROWTH 1 DAY  Final   Report Status PENDING  Incomplete  Urine culture     Status: None   Collection Time: 02/13/16  2:37 AM  Result Value Ref Range Status   Specimen Description URINE, CLEAN CATCH  Final   Special Requests NONE  Final   Culture MULTIPLE SPECIES PRESENT, SUGGEST RECOLLECTION  Final   Report Status 02/14/2016 FINAL  Final     Studies: No results found.  Scheduled Meds: . ALPRAZolam  0.5 mg Oral QHS  . cefTAZidime (FORTAZ)  IV  1 g Intravenous 3 times per day  . divalproex  500 mg Oral QHS  . enoxaparin (LOVENOX) injection  40 mg Subcutaneous Q24H  . feeding supplement (ENSURE ENLIVE)  237 mL Oral BID BM  . guaiFENesin  600 mg Oral  BID  . ipratropium-albuterol  3 mL Nebulization BID  . nadolol  20 mg Oral Daily  . nortriptyline  50 mg Oral QHS  . polyethylene glycol  17 g Oral BID  . potassium chloride SA  20 mEq Oral Daily  . sodium chloride flush  3 mL Intravenous Q12H  . vancomycin  500 mg Intravenous Q12H   Continuous Infusions:   Principal  Problem:   HCAP (healthcare-associated pneumonia) Active Problems:   Acute respiratory failure with hypoxia ([place])   Hyponatremia   COPD GOLD II/ still smoking   Essential hypertension   Tobacco abuse   Sepsis ([place])   Kien Mirsky, [facility] Pager [phone]. If 7PM-7AM, please contact night-coverage at [email or web address], password [facility] 02/14/2016, 5:09 PM  LOS: 2 days

## 2016-02-14 NOTE — Progress Notes (Addendum)
Pharmacy Antibiotic Note  Catherine Kelley is a 65 y.o. female admitted on 02/12/2016 with pneumonia.  Pharmacy has been consulted for Vancomycin and ceftazidime dosing.  Patient has listed allergy to Cefaclor, but tolerated ceftazidime in Dec 2016, so switching aztreonam to ceftazidime.  Plan: -Vancomycin 500 mg IV q12h -ceftazidime 1g IV q8h -follow c/s, renal function, clinical progression/LOT -VT at steady state  Height: '5\' 3"'$  (160 cm) Weight: 132 lb 4.4 oz (60 kg) IBW/kg (Calculated) : 52.4  Temp (24hrs), Avg:98.1 F (36.7 C), Min:97.5 F (36.4 C), Max:99 F (37.2 C)   Recent Labs Lab 02/12/16 1335 02/12/16 2211 02/13/16 0151 02/13/16 0207 02/14/16 0520  WBC 19.1*  --  13.6*  --  6.3  CREATININE 0.44  --  0.39*  --  0.38*  LATICACIDVEN  --  2.82*  --  1.68  --     Estimated Creatinine Clearance: 58 mL/min (by C-G formula based on Cr of 0.38).    Allergies  Allergen Reactions  . Ceclor [Cefaclor] Hives    Tolerated ceftazidime December 2016  . Escitalopram Oxalate     Pt does not recall ever taking medication  . Sertraline Hcl     Severe Headache  . Sulfa Drugs Cross Reactors Hives and Rash    Hives and rash   Antimicrobials this admission: Aztreonam 3/14>>3/16 Vancomycin 3/14>> Ceftazidime 3/16>>  Dose changes: N/a  Cultures: 3/15 UC: sent 3/14 BCx2: ngtd  Catherine Kelley D. Catherine Kelley, PharmD, BCPS Clinical Pharmacist Pager: [phone] 02/14/2016 11:11 AM

## 2016-02-15 ENCOUNTER — Inpatient Hospital Stay (HOSPITAL_COMMUNITY): Payer: Managed Care, Other (non HMO)

## 2016-02-15 DIAGNOSIS — Z720 Tobacco use: Secondary | ICD-10-CM

## 2016-02-15 LAB — CBC
HEMATOCRIT: 33.3 % — AB (ref 36.0–46.0)
HEMOGLOBIN: 10.4 g/dL — AB (ref 12.0–15.0)
MCH: 26.9 pg (ref 26.0–34.0)
MCHC: 31.2 g/dL (ref 30.0–36.0)
MCV: 86.3 fL (ref 78.0–100.0)
Platelets: 279 10*3/uL (ref 150–400)
RBC: 3.86 MIL/uL — AB (ref 3.87–5.11)
RDW: 15.5 % (ref 11.5–15.5)
WBC: 5.7 10*3/uL (ref 4.0–10.5)

## 2016-02-15 LAB — BASIC METABOLIC PANEL
ANION GAP: 8 (ref 5–15)
BUN: <5 mg/dL — ABNORMAL LOW (ref 6–20)
CO2: 25 mmol/L (ref 22–32)
Calcium: 8.2 mg/dL — ABNORMAL LOW (ref 8.9–10.3)
Chloride: 100 mmol/L — ABNORMAL LOW (ref 101–111)
Creatinine, Ser: 0.33 mg/dL — ABNORMAL LOW (ref 0.44–1.00)
GLUCOSE: 86 mg/dL (ref 65–99)
POTASSIUM: 4.6 mmol/L (ref 3.5–5.1)
Sodium: 133 mmol/L — ABNORMAL LOW (ref 135–145)

## 2016-02-15 MED ORDER — AZITHROMYCIN 500 MG PO TABS
250.0000 mg | ORAL_TABLET | Freq: Every day | ORAL | Status: DC
  Administered 2016-02-16: 250 mg via ORAL
  Filled 2016-02-15: qty 1

## 2016-02-15 MED ORDER — CEFUROXIME AXETIL 250 MG PO TABS
250.0000 mg | ORAL_TABLET | Freq: Two times a day (BID) | ORAL | Status: DC
  Administered 2016-02-15 – 2016-02-16 (×2): 250 mg via ORAL
  Filled 2016-02-15 (×3): qty 1

## 2016-02-15 MED ORDER — AZITHROMYCIN 500 MG PO TABS
500.0000 mg | ORAL_TABLET | Freq: Every day | ORAL | Status: AC
  Administered 2016-02-15: 500 mg via ORAL
  Filled 2016-02-15: qty 1

## 2016-02-15 MED ORDER — NYSTATIN 100000 UNIT/GM EX OINT
TOPICAL_OINTMENT | Freq: Two times a day (BID) | CUTANEOUS | Status: DC
  Administered 2016-02-15: 15:00:00 via TOPICAL
  Administered 2016-02-15 – 2016-02-16 (×2): 1 via TOPICAL
  Filled 2016-02-15: qty 15

## 2016-02-15 NOTE — Evaluation (Signed)
Physical Therapy Evaluation Patient Details Name: Catherine Kelley MRN: [id number] DOB: Apr 26, 1951 Today's Date: 02/15/2016   History of Present Illness  65 y.o. female was admitted with coughing and SOB. Past medical history of hypertension, COPD, anxiety, depression, tobacco abuse disorder, prior necrotizing pneumonia presents with HCAP (healthcare-associated pneumonia)/COPD exacerbation/acute hypoxic respiratory failure with underlying history of bronchiectasis   Clinical Impression  Patient demonstrates deficits in functional mobility as indicated below. Will need continued skilled PT to address deficits and maximize function. Will see as indicated and progress as tolerated.    Follow Up Recommendations Home health PT;Supervision for mobility/OOB    Equipment Recommendations  None recommended by PT    Recommendations for Other Services       Precautions / Restrictions Precautions Precautions: Fall Restrictions Weight Bearing Restrictions: No      Mobility  Bed Mobility               General bed mobility comments: received in chair  Transfers Overall transfer level: Needs assistance Equipment used: Rolling walker (2 wheeled);None Transfers: Sit to/from Stand Sit to Stand: Min guard         General transfer comment: for safety.  Pt has had several falls  Ambulation/Gait Ambulation/Gait assistance: Supervision;Min guard Ambulation Distance (Feet): 200 Feet (40 with RW) Assistive device: None Gait Pattern/deviations: Shuffle;Decreased stride length;Drifts right/left;Narrow base of support Gait velocity: decreased Gait velocity interpretation: <1.8 ft/sec, indicative of risk for recurrent falls General Gait Details: patient with slow cadence and instability during gait, improved with increased speed. Patient with some increased lateral sway at times, 2 noted LOB but able to self correct. Decreased righting response  Stairs            Wheelchair Mobility     Modified Rankin (Stroke Patients Only)       Balance Overall balance assessment: History of Falls                                           Pertinent Vitals/Pain Pain Assessment: No/denies pain    Home Living Family/patient expects to be discharged to:: Private residence Living Arrangements: Spouse/significant other Available Help at Discharge: Family;Available PRN/intermittently Type of Home: House Home Access: Stairs to enter Entrance Stairs-Rails: Psychiatric nurse of Steps: 5 Home Layout: One level Home Equipment: Bedside commode Additional Comments: can put BSC into one shower stall    Prior Function Level of Independence: Independent         Comments: has had multiple falls, 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  She managed but had difficulty with socks and shoes.      General Comments General comments (skin integrity, edema, etc.): patient incontinent or urine, observed self care tasks of hygiene, pericare and dressing  with cues for energy conservation and safety. Patient also educated on safe use of DME and strategies to improve gait.    Exercises        Assessment/Plan    PT Assessment Patient needs continued PT services  PT Diagnosis Difficulty walking;Abnormality of gait;Generalized weakness;Acute pain   PT Problem List Decreased strength;Decreased activity  tolerance;Decreased balance;Decreased mobility  PT Treatment Interventions DME instruction;Gait training;Stair training;Functional mobility training;Therapeutic activities;Therapeutic exercise;Balance training;Patient/family education   PT Goals (Current goals can be found in the Care Plan section) Acute Rehab PT Goals Patient Stated Goal: get better; pt has had several recent hospitalizations PT Goal Formulation: With patient Time For Goal Achievement: 02/29/16 Potential to Achieve Goals: Good    Frequency Min 3X/week   Barriers to discharge Decreased caregiver support      Co-evaluation               End of Session Equipment Utilized During Treatment: Gait belt Activity Tolerance: No increased pain Patient left: in chair;with call bell/phone within reach;with chair alarm set;with family/visitor present Nurse Communication: Mobility status         Time: 1435-1458 PT Time Calculation (min) (ACUTE ONLY): 23 min   Charges:   PT Evaluation $PT Eval Moderate Complexity: 1 Procedure PT Treatments $Therapeutic Activity: 8-22 mins   PT G CodesDuncan Dull 2016-03-06, 5:09 PM Alben Deeds, [place] DPT  [phone]

## 2016-02-15 NOTE — Evaluation (Signed)
Occupational Therapy Evaluation Patient Details Name: Catherine Kelley MRN: [id number] DOB: Nov 30, 1951 Today's Date: 02/15/2016    History of Present Illness 65 y.o. female was admitted with coughing and SOB. Past medical history of hypertension, COPD, anxiety, depression, tobacco abuse disorder, prior necrotizing pneumonia presents with HCAP (healthcare-associated pneumonia)/COPD exacerbation/acute hypoxic respiratory failure with underlying history of bronchiectasis    Clinical Impression   Pt was admitted for the above.  She will benefit from continued OT in acute setting to increase activity tolerance/safety for adls.  Pt currently needs min guard overall.  Goals are for supervision with the exception of shower transfer at min guard for safety.  Pt has had several falls recently at home    Follow Up Recommendations  Supervision/Assistance - 24 hour    Equipment Recommendations  None recommended by OT    Recommendations for Other Services       Precautions / Restrictions Precautions Precautions: Fall Restrictions Weight Bearing Restrictions: No      Mobility Bed Mobility               General bed mobility comments: oob  Transfers Overall transfer level: Needs assistance Equipment used: Rolling walker (2 wheeled);None Transfers: Sit to/from Stand Sit to Stand: Min guard         General transfer comment: for safety.  Pt has had several falls    Balance Overall balance assessment: History of Falls                                          ADL Overall ADL's : Needs assistance/impaired     Grooming: Wash/dry face;Wash/dry hands;Standing;Supervision/safety   Upper Body Bathing: Set up;Sitting   Lower Body Bathing: Min guard;Sit to/from stand   Upper Body Dressing : Set up;Sitting   Lower Body Dressing: Min guard;Sit to/from stand   Toilet Transfer: Min guard;Ambulation;BSC;RW   Toileting- Water quality scientist and Hygiene: Min guard;Sit  to/from stand         General ADL Comments: Pt with 97% sats on RA, HR 85. Pt has a tendency to look at her feet when walking to bathroom.  She states that sometimes she has numbness.  Pt reported dizziness prior to falls.  Pt has difficulty describing dizziness. She reports lightheaded and losing balance to R. She did not describe any spinning/blurry vision.  She was not dizzy during OT but did have one slight loss of balance to R, while using RW,  from which she recovered.  Functionally she could look up and down and turn head to bil sides; normal position of neck is forward flexed.  Walked without walker to bathroom and with walker returning to chair.  Husband reports that pt is very independent natured     Estate agent      Pertinent Vitals/Pain Pain Assessment: No/denies pain     Hand Dominance     Extremity/Trunk Assessment Upper Extremity Assessment Upper Extremity Assessment: Overall WFL for tasks assessed           Communication Communication Communication: No difficulties   Cognition Arousal/Alertness: Awake/alert Behavior During Therapy: WFL for tasks assessed/performed                   General Comments: mostly wfls, however, husband present and shook his head "yes" when she said "no" to having difficulty with any  self care prior to admission.  She managed but had difficulty with socks and shoes.     General Comments       Exercises       Shoulder Instructions      Home Living Family/patient expects to be discharged to:: Private residence Living Arrangements: Spouse/significant other Available Help at Discharge: Family;Available PRN/intermittently Type of Home: House Home Access: Stairs to enter [employer] of Steps: 5 Entrance Stairs-Rails: Right;Left Home Layout: One level     Bathroom Shower/Tub: Occupational psychologist: Standard     Home Equipment: Bedside commode   Additional Comments: can  put BSC into one shower stall      Prior Functioning/Environment Level of Independence: Independent        Comments: has had multiple falls, 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[employer])    OT Diagnosis: Generalized weakness   OT Problem List: Decreased strength;Decreased activity tolerance;Decreased knowledge of use of DME or AE;Impaired balance (sitting and/or standing)   OT Treatment/Interventions: Self-care/ADL training;DME and/or AE instruction;Balance training;Patient/family education;energy conservation   OT Goals(Current goals can be found in the care plan section) Acute Rehab OT Goals Patient Stated Goal: get better; pt has had several recent hospitalizations OT Goal Formulation: With patient Time For Goal Achievement: 02/22/16 Potential to Achieve Goals: Good ADL Goals Pt Will Transfer to Toilet: with supervision;ambulating;bedside commode (and hygiene at supervision level) Pt Will Perform Tub/Shower Transfer: Shower transfer;3 in 1;shower seat;with min guard assist Additional ADL Goal #1: pt will complete ADL with supervision/set up, sit to stand  OT Frequency: Min 2X/week   Barriers to D/C:            Co-evaluation              End of Session    Activity Tolerance: Patient tolerated treatment well Patient left: in chair;with call bell/phone within reach;with chair alarm set;with family/visitor present   Time: 1325-1345 OT Time Calculation (min): 20 min Charges:  OT General Charges $OT Visit: 1 Procedure OT Evaluation $OT Eval Moderate Complexity: 1 Procedure G-Codes:    Catherine Kelley 02-23-2016, 4:14 PM Lesle Chris, OTR/L [phone] 02/23/16

## 2016-02-15 NOTE — Progress Notes (Signed)
[facility] PROGRESS NOTE  Catherine Kelley VEL:[id number] DOB: [birth year] DOA: 02/12/2016 PCP: [name], MD  HPI/Brief narrative 65 y.o. female with a history of Necrotizing Pneumonia, COPD, and HTN who presents to the ED with complaints of worsening SOB, and Cough with fevers and Chills x 5 days. She reports completing antibiotics (Levaquin) 1 week ago, but her sputum began to turn yellow and green. She also reports having increased Edema of both legs over the past 2 weeks. She was found to have a RML and RLL pneumonia and she was placed on IV Vancomycin and Aztreonam and referred for admission  Assessment/Plan: HCAP with sepsis on admission (healthcare-associated pneumonia) - Pt was started on empiric IV Vancomycin and Aztreonam, later changed to vanc w/ cefepime per order set. Pt reportedly tolerated cephalosporins in past - ContinueDuoNebs - Leukocytosis normalized. Afebrile - Cont flutter valve - Clinically improving. Will change to po abx  Acute respiratory failure with hypoxia (HCC) - Cont to wean O2 as tolerated  Hyponatremia- suspect Fluid Overload vs related to PNA - Improved - Cont to monitor  COPD GOLD II/ still smoking - Cont Duonebs as needed  Esential hypertension - BP currently stable and controlled - On Lasix   Diastolic Dysfunction, chronic - Remains on Lasix  - Clinically euvolemic at present  Tobacco abuse - Cessation was done  DVT Prophylaxis - Continue subqQ Lovenox  Code Status: Full Family Communication: Pt in room Disposition Plan: Possible d/c in 24-48hrs   Consultants:    Procedures:    Antibiotics: Anti-infectives    Start     Dose/Rate Route Frequency Ordered Stop   02/14/16 1300  fluconazole (DIFLUCAN) tablet 150 mg     150 mg Oral  Once 02/14/16 1243 02/14/16 1300   02/14/16 1200  cefTAZidime (FORTAZ) 1 g in dextrose 5 % 50 mL IVPB     1 g 100 mL/hr over 30 Minutes Intravenous 3 times  per day 02/14/16 1112     02/13/16 1000  vancomycin (VANCOCIN) 500 mg in sodium chloride 0.9 % 100 mL IVPB     500 mg 100 mL/hr over 60 Minutes Intravenous Every 12 hours 02/12/16 2323     02/13/16 0600  aztreonam (AZACTAM) 2 g in dextrose 5 % 50 mL IVPB  Status:  Discontinued     2 g 100 mL/hr over 30 Minutes Intravenous 3 times per day 02/12/16 2248 02/14/16 1055   02/12/16 2200  aztreonam (AZACTAM) 1 g in dextrose 5 % 50 mL IVPB  Status:  Discontinued     1 g 100 mL/hr over 30 Minutes Intravenous 3 times per day 02/12/16 2001 02/13/16 0445   02/12/16 1945  vancomycin (VANCOCIN) IVPB 1000 mg/200 mL premix     1,000 mg 200 mL/hr over 60 Minutes Intravenous  Once 02/12/16 1942 02/12/16 2103      HPI/Subjective: Reports feeling better. Asking about going home  Objective: Filed Vitals:   02/14/16 2135 02/15/16 0656 02/15/16 0944 02/15/16 1516  BP:  130/57 136/50 120/47  Pulse:  95 97 86  Temp:  99 F (37.2 C) 97.8 F (36.6 C) 97.4 F (36.3 C)  TempSrc:  Oral Oral Oral  Resp:  '18 16 18  '$ Height:      Weight:      SpO2: 98% 91% 93% 100%    Intake/Output Summary (Last 24 hours) at 02/15/16 1722 Last data filed at 02/15/16 1039  Gross per 24 hour  Intake    710 ml  Output  0 ml  Net    710 ml   Filed Weights   02/12/16 1316 02/13/16 1153  Weight: 58.968 kg (130 lb) 60 kg (132 lb 4.4 oz)    Exam:   General:  Awake, in nad, laying in bed  Cardiovascular: regular, s1, s2  Respiratory: normal resp effort, no wheezing  Abdomen: soft,nondistended, pos bs  Musculoskeletal: perfused, no clubbing, no cyanosis  Data Reviewed: Basic Metabolic Panel:  Recent Labs Lab 02/12/16 1335 02/13/16 0151 02/14/16 0520 02/15/16 0557  NA 129* 131* 134* 133*  K 3.4* 2.8* 3.9 4.6  CL 91* 93* 100* 100*  CO2 '28 28 25 25  '$ GLUCOSE 109* 76 100* 86  BUN <5* <5* <5* <5*  CREATININE 0.44 0.39* 0.38* 0.33*  CALCIUM 8.5* 7.8* 7.9* 8.2*  MG  --   --  1.6*  --    Liver Function  Tests: No results for input(s): AST, ALT, ALKPHOS, BILITOT, PROT, ALBUMIN in the last 168 hours. No results for input(s): LIPASE, AMYLASE in the last 168 hours. No results for input(s): AMMONIA in the last 168 hours. CBC:  Recent Labs Lab 02/12/16 1335 02/13/16 0151 02/14/16 0520 02/15/16 0557  WBC 19.1* 13.6* 6.3 5.7  HGB 11.4* 10.4* 9.6* 10.4*  HCT 35.5* 32.1* 30.4* 33.3*  MCV 85.7 84.9 86.9 86.3  PLT 391 351 281 279   Cardiac Enzymes: No results for input(s): CKTOTAL, CKMB, CKMBINDEX, TROPONINI in the last 168 hours. BNP (last 3 results)  Recent Labs  02/12/16 1335  BNP 136.2*    ProBNP (last 3 results) No results for input(s): PROBNP in the last 8760 hours.  CBG: No results for input(s): GLUCAP in the last 168 hours.  Recent Results (from the past 240 hour(s))  Blood Culture (routine x 2)     Status: None (Preliminary result)   Collection Time: 02/13/16  1:45 AM  Result Value Ref Range Status   Specimen Description BLOOD RIGHT FOREARM  Final   Special Requests BOTTLES DRAWN AEROBIC AND ANAEROBIC 10ML  Final   Culture NO GROWTH 2 DAYS  Final   Report Status PENDING  Incomplete  Blood Culture (routine x 2)     Status: None (Preliminary result)   Collection Time: 02/13/16  1:51 AM  Result Value Ref Range Status   Specimen Description BLOOD RIGHT HAND  Final   Special Requests BOTTLES DRAWN AEROBIC AND ANAEROBIC 5ML  Final   Culture NO GROWTH 2 DAYS  Final   Report Status PENDING  Incomplete  Urine culture     Status: None   Collection Time: 02/13/16  2:37 AM  Result Value Ref Range Status   Specimen Description URINE, CLEAN CATCH  Final   Special Requests NONE  Final   Culture MULTIPLE SPECIES PRESENT, SUGGEST RECOLLECTION  Final   Report Status 02/14/2016 FINAL  Final     Studies: Dg Chest Port 1 View  02/15/2016  CLINICAL DATA:  Pneumonia, productive cough. EXAM: PORTABLE CHEST 1 VIEW COMPARISON:  February 12, 2016. FINDINGS: Stable cardiomediastinal  silhouette. No pneumothorax is noted. Minimal left basilar subsegmental atelectasis is noted. Stable right lower lobe opacity is noted most consistent with pneumonia. Bony thorax is unremarkable. IMPRESSION: Minimal left basilar subsegmental atelectasis. Stable right lower lobe opacity is noted consistent with pneumonia. Electronically Signed   By: Marijo Conception, M.D.   On: 02/15/2016 08:14    Scheduled Meds: . ALPRAZolam  0.5 mg Oral QHS  . cefTAZidime (FORTAZ)  IV  1 g Intravenous 3  times per day  . divalproex  500 mg Oral QHS  . enoxaparin (LOVENOX) injection  40 mg Subcutaneous Q24H  . feeding supplement (ENSURE ENLIVE)  237 mL Oral BID BM  . guaiFENesin  600 mg Oral BID  . ipratropium-albuterol  3 mL Nebulization BID  . nadolol  20 mg Oral Daily  . nortriptyline  50 mg Oral QHS  . nystatin ointment   Topical BID  . polyethylene glycol  17 g Oral BID  . potassium chloride SA  20 mEq Oral Daily  . sodium chloride flush  3 mL Intravenous Q12H  . vancomycin  500 mg Intravenous Q12H   Continuous Infusions:   Principal Problem:   HCAP (healthcare-associated pneumonia) Active Problems:   Acute respiratory failure with hypoxia (HCC)   Hyponatremia   COPD GOLD II/ still smoking   Essential hypertension   Tobacco abuse   Sepsis ([place])   Arad Burston, [facility] Pager [phone]. If 7PM-7AM, please contact night-coverage at [email or web address], password [facility] 02/15/2016, 5:22 PM  LOS: 3 days

## 2016-02-16 LAB — COMPREHENSIVE METABOLIC PANEL
ALT: 27 U/L (ref 14–54)
AST: 48 U/L — AB (ref 15–41)
Albumin: 1.6 g/dL — ABNORMAL LOW (ref 3.5–5.0)
Alkaline Phosphatase: 148 U/L — ABNORMAL HIGH (ref 38–126)
Anion gap: 7 (ref 5–15)
BILIRUBIN TOTAL: 0.5 mg/dL (ref 0.3–1.2)
CHLORIDE: 99 mmol/L — AB (ref 101–111)
CO2: 27 mmol/L (ref 22–32)
CREATININE: 0.34 mg/dL — AB (ref 0.44–1.00)
Calcium: 8.3 mg/dL — ABNORMAL LOW (ref 8.9–10.3)
Glucose, Bld: 112 mg/dL — ABNORMAL HIGH (ref 65–99)
POTASSIUM: 4.6 mmol/L (ref 3.5–5.1)
Sodium: 133 mmol/L — ABNORMAL LOW (ref 135–145)
TOTAL PROTEIN: 6 g/dL — AB (ref 6.5–8.1)

## 2016-02-16 LAB — CBC WITH DIFFERENTIAL/PLATELET
BASOS ABS: 0 10*3/uL (ref 0.0–0.1)
Basophils Relative: 0 %
EOS ABS: 0.1 10*3/uL (ref 0.0–0.7)
EOS PCT: 1 %
HCT: 31.5 % — ABNORMAL LOW (ref 36.0–46.0)
HEMOGLOBIN: 10.2 g/dL — AB (ref 12.0–15.0)
LYMPHS ABS: 1.5 10*3/uL (ref 0.7–4.0)
LYMPHS PCT: 23 %
MCH: 28.3 pg (ref 26.0–34.0)
MCHC: 32.4 g/dL (ref 30.0–36.0)
MCV: 87.3 fL (ref 78.0–100.0)
Monocytes Absolute: 0.8 10*3/uL (ref 0.1–1.0)
Monocytes Relative: 12 %
NEUTROS PCT: 64 %
Neutro Abs: 4.2 10*3/uL (ref 1.7–7.7)
PLATELETS: 291 10*3/uL (ref 150–400)
RBC: 3.61 MIL/uL — AB (ref 3.87–5.11)
RDW: 15.7 % — ABNORMAL HIGH (ref 11.5–15.5)
WBC: 6.6 10*3/uL (ref 4.0–10.5)

## 2016-02-16 MED ORDER — AZITHROMYCIN 250 MG PO TABS: 250.0000 mg | ORAL_TABLET | Freq: Every day | ORAL | Status: DC

## 2016-02-16 MED ORDER — CEFUROXIME AXETIL 250 MG PO TABS: 250.0000 mg | ORAL_TABLET | Freq: Two times a day (BID) | ORAL | Status: DC

## 2016-02-16 NOTE — Progress Notes (Signed)
Pt given discharge instructions, prescriptions, and care notes. Pt verbalized understanding AEB no further questions or concerns at this time. IV was discontinued, no redness, pain, or swelling noted at this time. Telemetry discontinued and Centralized Telemetry was notified. Pt left the floor via wheelchair with staff in stable condition. 

## 2016-02-16 NOTE — Discharge Summary (Signed)
Physician Discharge Summary  STEPHENY CANAL EXH:[id number] DOB: [birth year] DOA: 02/12/2016  PCP: [name], MD  Admit date: 02/12/2016 Discharge date: 02/16/2016  Time spent: 20 minutes  Recommendations for Outpatient Follow-up:  1. Follow up with PCP in 2-3 weeks   Discharge Diagnoses:  Principal Problem:   HCAP (healthcare-associated pneumonia) Active Problems:   Acute respiratory failure with hypoxia ([place])   Hyponatremia   COPD GOLD II/ still smoking   Essential hypertension   Tobacco abuse   Sepsis [facility])   Discharge Condition: Improved  Diet recommendation: Heart healthy  Filed Weights   02/12/16 1316 02/13/16 1153  Weight: 58.968 kg (130 lb) 60 kg (132 lb 4.4 oz)    History of present illness:  Please review dictated H and P from 3/14 for details. Briefly, 65 y.o. female with a history of Necrotizing Pneumonia, COPD, and HTN who presents to the ED with complaints of worsening SOB, and Cough with fevers and Chills x 5 days. She reports completing antibiotics (Levaquin) 1 week ago, but her sputum began to turn yellow and green. She also reports having increased Edema of both legs over the past 2 weeks. She was found to have a RML and RLL pneumonia and she was placed on IV Vancomycin and Aztreonam and referred for admission  Hospital Course:  HCAP with sepsis on admission (healthcare-associated pneumonia) - Pt was started on empiric IV Vancomycin and Aztreonam, later changed to vanc w/ cefepime per order set. Pt reportedly tolerated cephalosporins in past - ContinueDuoNebs - Leukocytosis normalized. Afebrile - Cont flutter valve - Successfully changed to po abx to complete on discharge  Acute respiratory failure with hypoxia (HCC) - Cont to wean O2 as tolerated  Hyponatremia- suspect Fluid Overload vs related to PNA - Improved - Cont to monitor  COPD GOLD II/ still smoking - Cont Duonebs as needed  Esential hypertension - BP currently stable and  controlled - On Lasix   Diastolic Dysfunction, chronic - Remains on Lasix  - Clinically euvolemic at present  Tobacco abuse - Cessation was done  DVT Prophylaxis - Continue subqQ Lovenox  Discharge Exam: Filed Vitals:   02/16/16 0322 02/16/16 0525 02/16/16 0927 02/16/16 1038  BP: 114/69 122/62  118/63  Pulse: 94 90  93  Temp: 97.4 F (36.3 C) 97.7 F (36.5 C)  97.8 F (36.6 C)  TempSrc: Oral Oral  Oral  Resp: '18 18  18  '$ Height:      Weight:      SpO2: 98% 96% 95% 97%    General: Awake, in nad Cardiovascular: regular, s1, s2 Respiratory: normal resp effort, no wheezing  Discharge Instructions     Medication List    STOP taking these medications        levofloxacin 500 MG tablet  Commonly known as:  LEVAQUIN     predniSONE 10 MG tablet  Commonly known as:  DELTASONE      TAKE these medications        albuterol (2.5 MG/3ML) 0.083% nebulizer solution  Commonly known as:  PROVENTIL  Take 3 mLs (2.5 mg total) by nebulization every 6 (six) hours as needed for wheezing or shortness of breath.     ALPRAZolam 0.5 MG tablet  Commonly known as:  XANAX  Take 0.5 mg by mouth at bedtime.     azithromycin 250 MG tablet  Commonly known as:  ZITHROMAX  Take 1 tablet (250 mg total) by mouth daily.     benzonatate 100 MG capsule  Commonly known  as:  TESSALON  TAKE 1 TO 2 CAPSULES BY MOUTH 3 TIMES DAILY AS NEEDED FOR COUGH     cefUROXime 250 MG tablet  Commonly known as:  CEFTIN  Take 1 tablet (250 mg total) by mouth 2 (two) times daily with a meal.     divalproex 500 MG 24 hr tablet  Commonly known as:  DEPAKOTE ER  Take 500 mg by mouth at bedtime.     fluticasone 50 MCG/ACT nasal spray  Commonly known as:  FLONASE  Place 2 sprays into both nostrils daily.     furosemide 20 MG tablet  Commonly known as:  LASIX  TAKE 2 TABLETS BY MOUTH EVERY DAY     guaiFENesin 600 MG 12 hr tablet  Commonly known as:  MUCINEX  Take 1 tablet  (600 mg total) by mouth 2 (two) times daily.     HYDROcodone-acetaminophen 5-325 MG tablet  Commonly known as:  NORCO/VICODIN  Take 1 tablet by mouth every 6 (six) hours as needed for moderate pain.     ipratropium 0.02 % nebulizer solution  Commonly known as:  ATROVENT  Take 2.5 mLs (0.5 mg total) by nebulization every 6 (six) hours as needed for wheezing or shortness of breath.     ipratropium 0.03 % nasal spray  Commonly known as:  ATROVENT  Place 2 sprays into both nostrils 2 (two) times daily. Reported on 12/19/2015     nadolol 40 MG tablet  Commonly known as:  CORGARD  Take 20 mg by mouth daily.     nortriptyline 50 MG capsule  Commonly known as:  PAMELOR  Take 50 mg by mouth at bedtime.     potassium chloride 20 MEQ packet  Commonly known as:  KLOR-CON  Take 2 tablets by mouth once a day     sodium chloride HYPERTONIC 3 % nebulizer solution  Take by nebulization as needed for other.     tiZANidine 4 MG tablet  Commonly known as:  ZANAFLEX  Take 1 tablet (4 mg total) by mouth every 8 (eight) hours as needed for muscle spasms.     umeclidinium-vilanterol 62.5-25 MCG/INH Aepb  Commonly known as:  ANORO ELLIPTA  Inhale 1 puff into the lungs daily.       Allergies  Allergen Reactions  . Ceclor [Cefaclor] Hives    Tolerated ceftazidime December 2016  . Escitalopram Oxalate     Pt does not recall ever taking medication  . Sertraline Hcl     Severe Headache  . Sulfa Drugs Cross Reactors Hives and Rash    Hives and rash   Follow-up Information    Follow up with SMITH,KRISTI, MD. Schedule an appointment as soon as possible for a visit in 2 weeks.   Specialty:  Family Medicine   Why:  Hospital follow up   Contact information:   [place] [address] [phone]        The results of significant diagnostics from this hospitalization (including imaging, microbiology, ancillary and laboratory) are listed below for reference.    Significant  Diagnostic Studies: Dg Chest 2 View  02/12/2016  CLINICAL DATA:  Lower extremity swelling for 2 weeks, beginning to leak fluid, RIGHT side chest pain under breast, productive cough for 1 week, history of necrotizing pneumonia question recurrent pneumonia, history smoking, asthma, COPD, GERD EXAM: CHEST  2 VIEW COMPARISON:  01/24/2016 FINDINGS: Enlargement of cardiac silhouette. Mediastinal contours and pulmonary vascularity normal. Increased RIGHT middle and RIGHT lower lobe infiltrates consistent  with pneumonia. Central bronchitic changes. LEFT lung clear. No pleural effusion or pneumothorax. Bones demineralized. IMPRESSION: Increased RIGHT middle and RIGHT lower lobe infiltrates consistent with pneumonia. Electronically Signed   By: Lavonia Dana M.D.   On: 02/12/2016 14:01   Dg Chest 2 View  01/24/2016  CLINICAL DATA:  Cough and congestion.  Recent pneumonia. EXAM: CHEST  2 VIEW COMPARISON:  January 13, 2016 FINDINGS: Airspace consolidation consistent with pneumonia in the right middle and lower lobe regions is stable compared to recent prior study. No new opacity is evident. There are nipple shadows bilaterally. Heart size and pulmonary vascularity are normal. No adenopathy. There is mild degenerative change in the thoracic spine. IMPRESSION: Persistent right middle and right lower lobe airspace consolidation consistent with pneumonia. No new opacity. No change in cardiac silhouette. Electronically Signed   By: Lowella Grip III M.D.   On: 01/24/2016 15:45   Dg Chest Port 1 View  02/15/2016  CLINICAL DATA:  Pneumonia, productive cough. EXAM: PORTABLE CHEST 1 VIEW COMPARISON:  February 12, 2016. FINDINGS: Stable cardiomediastinal silhouette. No pneumothorax is noted. Minimal left basilar subsegmental atelectasis is noted. Stable right lower lobe opacity is noted most consistent with pneumonia. Bony thorax is unremarkable. IMPRESSION: Minimal left basilar subsegmental atelectasis. Stable right lower lobe  opacity is noted consistent with pneumonia. Electronically Signed   By: Marijo Conception, M.D.   On: 02/15/2016 08:14    Microbiology: Recent Results (from the past 240 hour(s))  Blood Culture (routine x 2)     Status: None (Preliminary result)   Collection Time: 02/13/16  1:45 AM  Result Value Ref Range Status   Specimen Description BLOOD RIGHT FOREARM  Final   Special Requests BOTTLES DRAWN AEROBIC AND ANAEROBIC 10ML  Final   Culture NO GROWTH 3 DAYS  Final   Report Status PENDING  Incomplete  Blood Culture (routine x 2)     Status: None (Preliminary result)   Collection Time: 02/13/16  1:51 AM  Result Value Ref Range Status   Specimen Description BLOOD RIGHT HAND  Final   Special Requests BOTTLES DRAWN AEROBIC AND ANAEROBIC 5ML  Final   Culture NO GROWTH 3 DAYS  Final   Report Status PENDING  Incomplete  Urine culture     Status: None   Collection Time: 02/13/16  2:37 AM  Result Value Ref Range Status   Specimen Description URINE, CLEAN CATCH  Final   Special Requests NONE  Final   Culture MULTIPLE SPECIES PRESENT, SUGGEST RECOLLECTION  Final   Report Status 02/14/2016 FINAL  Final     Labs: Basic Metabolic Panel:  Recent Labs Lab 02/12/16 1335 02/13/16 0151 02/14/16 0520 02/15/16 0557 02/16/16 0643  NA 129* 131* 134* 133* 133*  K 3.4* 2.8* 3.9 4.6 4.6  CL 91* 93* 100* 100* 99*  CO2 '28 28 25 25 27  '$ GLUCOSE 109* 76 100* 86 112*  BUN <5* <5* <5* <5* <5*  CREATININE 0.44 0.39* 0.38* 0.33* 0.34*  CALCIUM 8.5* 7.8* 7.9* 8.2* 8.3*  MG  --   --  1.6*  --   --    Liver Function Tests:  Recent Labs Lab 02/16/16 0643  AST 48*  ALT 27  ALKPHOS 148*  BILITOT 0.5  PROT 6.0*  ALBUMIN 1.6*   No results for input(s): LIPASE, AMYLASE in the last 168 hours. No results for input(s): AMMONIA in the last 168 hours. CBC:  Recent Labs Lab 02/12/16 1335 02/13/16 0151 02/14/16 0520 02/15/16 0557 02/16/16 6213  WBC 19.1* 13.6* 6.3 5.7 6.6  NEUTROABS  --   --   --   --   4.2  HGB 11.4* 10.4* 9.6* 10.4* 10.2*  HCT 35.5* 32.1* 30.4* 33.3* 31.5*  MCV 85.7 84.9 86.9 86.3 87.3  PLT 391 351 281 279 291   Cardiac Enzymes: No results for input(s): CKTOTAL, CKMB, CKMBINDEX, TROPONINI in the last 168 hours. BNP: BNP (last 3 results)  Recent Labs  02/12/16 1335  BNP 136.2*    ProBNP (last 3 results) No results for input(s): PROBNP in the last 8760 hours.  CBG: No results for input(s): GLUCAP in the last 168 hours.   Signed:  Saturnino Liew, Orpah Melter  [facility] 02/16/2016, 5:26 PM

## 2016-02-16 NOTE — Progress Notes (Signed)
Received several Text messages that pt HR was ST and SVT hr at 171 when I looked at strip appears to be Artifact I call CMT tech states that it was artifact will continue to monitor. I did check lead placement. Arthor Captain LPN

## 2016-02-18 LAB — CULTURE, BLOOD (ROUTINE X 2)
CULTURE: NO GROWTH
Culture: NO GROWTH

## 2016-03-11 ENCOUNTER — Ambulatory Visit (INDEPENDENT_AMBULATORY_CARE_PROVIDER_SITE_OTHER): Payer: Managed Care, Other (non HMO) | Admitting: Family Medicine

## 2016-03-11 ENCOUNTER — Ambulatory Visit (INDEPENDENT_AMBULATORY_CARE_PROVIDER_SITE_OTHER): Payer: Managed Care, Other (non HMO)

## 2016-03-11 ENCOUNTER — Encounter: Payer: Self-pay | Admitting: Family Medicine

## 2016-03-11 VITALS — BP 136/60 | HR 101 | Temp 98.1°F | Resp 16 | Ht 62.25 in | Wt 125.4 lb

## 2016-03-11 DIAGNOSIS — J449 Chronic obstructive pulmonary disease, unspecified: Secondary | ICD-10-CM

## 2016-03-11 DIAGNOSIS — E871 Hypo-osmolality and hyponatremia: Secondary | ICD-10-CM | POA: Diagnosis not present

## 2016-03-11 DIAGNOSIS — J9601 Acute respiratory failure with hypoxia: Secondary | ICD-10-CM | POA: Diagnosis not present

## 2016-03-11 DIAGNOSIS — J471 Bronchiectasis with (acute) exacerbation: Secondary | ICD-10-CM

## 2016-03-11 DIAGNOSIS — J189 Pneumonia, unspecified organism: Secondary | ICD-10-CM | POA: Diagnosis not present

## 2016-03-11 DIAGNOSIS — I872 Venous insufficiency (chronic) (peripheral): Secondary | ICD-10-CM | POA: Diagnosis not present

## 2016-03-11 DIAGNOSIS — M5417 Radiculopathy, lumbosacral region: Secondary | ICD-10-CM

## 2016-03-11 DIAGNOSIS — M5416 Radiculopathy, lumbar region: Secondary | ICD-10-CM

## 2016-03-11 MED ORDER — TIZANIDINE HCL 4 MG PO TABS: 4.0000 mg | ORAL_TABLET | Freq: Three times a day (TID) | ORAL | Status: DC | PRN

## 2016-03-11 MED ORDER — BENZONATATE 100 MG PO CAPS: ORAL_CAPSULE | ORAL | Status: DC

## 2016-03-11 MED ORDER — HYDROCODONE-ACETAMINOPHEN 5-325 MG PO TABS: 0.5000 | ORAL_TABLET | Freq: Four times a day (QID) | ORAL | Status: DC | PRN

## 2016-03-11 MED ORDER — CEFUROXIME AXETIL 250 MG PO TABS: 250.0000 mg | ORAL_TABLET | Freq: Two times a day (BID) | ORAL | Status: DC

## 2016-03-11 NOTE — Patient Instructions (Signed)
     IF you received an x-ray today, you will receive an invoice from [facility]. Please contact [facility] at [phone] with questions or concerns regarding your invoice.   IF you received labwork today, you will receive an invoice from [employer]. Please contact [facility] at [phone] with questions or concerns regarding your invoice.   Our billing staff will not be able to assist you with questions regarding bills from these companies.  You will be contacted with the lab results as soon as they are available. The fastest way to get your results is to activate your My Chart account. Instructions are located on the last page of this paperwork. If you have not heard from [name] regarding the results in 2 weeks, please contact this office.        IF you received an x-ray today, you will receive an invoice from [facility]. Please contact [facility] at [phone] with questions or concerns regarding your invoice.   IF you received labwork today, you will receive an invoice from [employer]. Please contact [facility] at [phone] with questions or concerns regarding your invoice.   Our billing staff will not be able to assist you with questions regarding bills from these companies.  You will be contacted with the lab results as soon as they are available. The fastest way to get your results is to activate your My Chart account. Instructions are located on the last page of this paperwork. If you have not heard from [name] regarding the results in 2 weeks, please contact this office.

## 2016-03-11 NOTE — Progress Notes (Signed)
Subjective:    Patient ID: Catherine Kelley, female    DOB: 10-Feb-1951, 65 y.o.   MRN: [id number]  03/11/2016  Follow-up; Cough; and Fever   HPI This 65 y.o. female presents for follow-up/transition into care:  Admit date: 02/12/2016 Discharge date: 02/16/2016  Time spent: 20 minutes  Recommendations for Outpatient Follow-up:  1. Follow up with PCP in 2-3 weeks  Discharge Diagnoses:  Principal Problem:  HCAP (healthcare-associated pneumonia) Active Problems:  Acute respiratory failure with hypoxia ([place])  Hyponatremia  COPD GOLD II/ still smoking  Essential hypertension  Tobacco abuse  Sepsis [facility])   Discharge Condition: Improved  Diet recommendation: Heart healthy  Filed Weights   02/12/16 1316 02/13/16 1153  Weight: 58.968 kg (130 lb) 60 kg (132 lb 4.4 oz)    History of present illness:  Please review dictated H and P from 3/14 for details. Briefly, 65 y.o. female with a history of Necrotizing Pneumonia, COPD, and HTN who presents to the ED with complaints of worsening SOB, and Cough with fevers and Chills x 5 days. She reports completing antibiotics (Levaquin) 1 week ago, but her sputum began to turn yellow and green. She also reports having increased Edema of both legs over the past 2 weeks. She was found to have a RML and RLL pneumonia and she was placed on IV Vancomycin and Aztreonam and referred for admission  Hospital Course:  HCAP with sepsis on admission (healthcare-associated pneumonia) - Pt was started on empiric IV Vancomycin and Aztreonam, later changed to vanc w/ cefepime per order set. Pt reportedly tolerated cephalosporins in past - ContinueDuoNebs - Leukocytosis normalized. Afebrile - Cont flutter valve - Successfully changed to po abx to complete on discharge  Acute respiratory failure with hypoxia (HCC) - Cont to wean O2 as tolerated  Hyponatremia- suspect Fluid Overload vs related to PNA - Improved - Cont to  monitor  COPD GOLD II/ still smoking - Cont Duonebs as needed  Esential hypertension - BP currently stable and controlled - On Lasix   Diastolic Dysfunction, chronic - Remains on Lasix  - Clinically euvolemic at present  Tobacco abuse - Cessation was done  DVT Prophylaxis - Continue subqQ Lovenox  Discharge Exam: Filed Vitals:   02/16/16 0322 02/16/16 0525 02/16/16 0927 02/16/16 1038  BP: 114/69 122/62  118/63  Pulse: 94 90  93  Temp: 97.4 F (36.3 C) 97.7 F (36.5 C)  97.8 F (36.6 C)  TempSrc: Oral Oral  Oral  Resp: '18 18  18  '$ Height:      Weight:      SpO2: 98% 96% 95% 97%    General: Awake, in nad Cardiovascular: regular, s1, s2 Respiratory: normal resp effort, no wheezing  Discharge Instructions     Medication List    STOP taking these medications       levofloxacin 500 MG tablet  Commonly known as: LEVAQUIN     predniSONE 10 MG tablet  Commonly known as: DELTASONE      TAKE these medications       albuterol (2.5 MG/3ML) 0.083% nebulizer solution  Commonly known as: PROVENTIL  Take 3 mLs (2.5 mg total) by nebulization every 6 (six) hours as needed for wheezing or shortness of breath.     ALPRAZolam 0.5 MG tablet  Commonly known as: XANAX  Take 0.5 mg by mouth at bedtime.     azithromycin 250 MG tablet  Commonly known as: ZITHROMAX  Take 1 tablet (250 mg total) by mouth daily.  benzonatate 100 MG capsule  Commonly known as: TESSALON  TAKE 1 TO 2 CAPSULES BY MOUTH 3 TIMES DAILY AS NEEDED FOR COUGH     cefUROXime 250 MG tablet  Commonly known as: CEFTIN  Take 1 tablet (250 mg total) by mouth 2 (two) times daily with a meal.     divalproex 500 MG 24 hr tablet  Commonly known as: DEPAKOTE ER  Take 500 mg by mouth at bedtime.     fluticasone 50 MCG/ACT nasal spray  Commonly known as: FLONASE  Place 2  sprays into both nostrils daily.     furosemide 20 MG tablet  Commonly known as: LASIX  TAKE 2 TABLETS BY MOUTH EVERY DAY     guaiFENesin 600 MG 12 hr tablet  Commonly known as: MUCINEX  Take 1 tablet (600 mg total) by mouth 2 (two) times daily.     HYDROcodone-acetaminophen 5-325 MG tablet  Commonly known as: NORCO/VICODIN  Take 1 tablet by mouth every 6 (six) hours as needed for moderate pain.     ipratropium 0.02 % nebulizer solution  Commonly known as: ATROVENT  Take 2.5 mLs (0.5 mg total) by nebulization every 6 (six) hours as needed for wheezing or shortness of breath.     ipratropium 0.03 % nasal spray  Commonly known as: ATROVENT  Place 2 sprays into both nostrils 2 (two) times daily. Reported on 12/19/2015     nadolol 40 MG tablet  Commonly known as: CORGARD  Take 20 mg by mouth daily.     nortriptyline 50 MG capsule  Commonly known as: PAMELOR  Take 50 mg by mouth at bedtime.     potassium chloride 20 MEQ packet  Commonly known as: KLOR-CON  Take 2 tablets by mouth once a day     sodium chloride HYPERTONIC 3 % nebulizer solution  Take by nebulization as needed for other.     tiZANidine 4 MG tablet  Commonly known as: ZANAFLEX  Take 1 tablet (4 mg total) by mouth every 8 (eight) hours as needed for muscle spasms.     umeclidinium-vilanterol 62.5-25 MCG/INH Aepb  Commonly known as: ANORO ELLIPTA  Inhale 1 puff into the lungs daily.            1.  Lower extremity edema: legs were leaking water; admitted; admitted for four days; had pneumonia.  Still coughing up mucous.  Ran out of [employer]  Got a vibrating vest and insurance covered.  Also received Albuterol nebulizer; also provided with saline solution nebulizer for lungs.  Has retired but will not be effective until 03-28-2016.  Had stopped Lasix and ARB.    2. Healthcare-associated pneumonia with hypoxia:  Admitted for four days.   Placed on iv vancomycin and Aztreonam; later changed to vanc with cefepime.    3. Deconditioning/weak: has cane, bath chair, walker.    3.  COPD:  Doing Duoneb twice daily.    4.  HTN: Nadalol 1/2 daily.   Review of Systems  Constitutional: Negative for fever, chills, diaphoresis and fatigue.  Eyes: Negative for visual disturbance.  Respiratory: Negative for cough and shortness of breath.   Cardiovascular: Negative for chest pain, palpitations and leg swelling.  Gastrointestinal: Negative for nausea, vomiting, abdominal pain, diarrhea and constipation.  Endocrine: Negative for cold intolerance, heat intolerance, polydipsia, polyphagia and polyuria.  Neurological: Negative for dizziness, tremors, seizures, syncope, facial asymmetry, speech difficulty, weakness, light-headedness, numbness and headaches.    Past Medical History  Diagnosis Date  . Hypertension  essential hypertension  . Tobacco abuse     smoking about 1/2 pk of cigarettes a day  . Anxiety   . Depression     psychiatrist Dr. Toy Care  . Emphysematous COPD ([place])     changes in right base along with patchy areas on last x-ray  . COPD (chronic obstructive pulmonary disease) ([place])   . History of echocardiogram 12/24/2006    Est. EF of 88-41% NORMAL LV SYSTOLIC FUNCTION WITH IMPAIRED RELAXATION -- MILD AORTIC SCLEROSIS -- NORMAL PALONARY ARTERY PRESSURE -- NO OLD ECHOS FOR COMPARISON -- [name], MD  . History of cardiovascular stress test 08/15/2004    EF of 70% -- Normal stress cardiolite.  There is no evidence of ischemia and there is normal LV function. -- [name]. MD  . Diastolic dysfunction   . Allergy   . Asthma   . Heart murmur   . Chronic bronchitis ([place])     "get it alot; maybe not q yr" (07/06/2014)  . GERD (gastroesophageal reflux disease)   . Pneumonia     "several times since May 2015" (07/06/2014)  . Necrotic pneumonia ([place])     recurrent/notes 02/12/2016  . HCAP (healthcare-associated  pneumonia) 02/12/2016   Past Surgical History  Procedure Laterality Date  . Tubal ligation  1986  . Dilation and curettage of uterus     Allergies  Allergen Reactions  . Ceclor [Cefaclor] Hives    Tolerated ceftazidime December 2016  . Escitalopram Oxalate     Pt does not recall ever taking medication  . Sertraline Hcl     Severe Headache  . Sulfa Drugs Cross Reactors Hives and Rash    Hives and rash   Current Outpatient Prescriptions  Medication Sig Dispense Refill  . albuterol (PROVENTIL) (2.5 MG/3ML) 0.083% nebulizer solution Take 3 mLs (2.5 mg total) by nebulization every 6 (six) hours as needed for wheezing or shortness of breath. 75 mL 12  . ALPRAZolam (XANAX) 0.5 MG tablet Take 0.5 mg by mouth at bedtime.     . benzonatate (TESSALON) 100 MG capsule TAKE 1 TO 2 CAPSULES BY MOUTH 3 TIMES DAILY AS NEEDED FOR COUGH 60 capsule 2  . divalproex (DEPAKOTE ER) 500 MG 24 hr tablet Take 500 mg by mouth at bedtime.     . fluticasone (FLONASE) 50 MCG/ACT nasal spray Place 2 sprays into both nostrils daily.    . furosemide (LASIX) 20 MG tablet TAKE 2 TABLETS BY MOUTH EVERY DAY (Patient taking differently: Take 20 mg by mouth daily) 180 tablet 1  . guaiFENesin (MUCINEX) 600 MG 12 hr tablet Take 1 tablet (600 mg total) by mouth 2 (two) times daily. 60 tablet 0  . HYDROcodone-acetaminophen (NORCO/VICODIN) 5-325 MG tablet Take 0.5-1 tablets by mouth every 6 (six) hours as needed for moderate pain. 40 tablet 0  . ipratropium (ATROVENT) 0.02 % nebulizer solution Take 2.5 mLs (0.5 mg total) by nebulization every 6 (six) hours as needed for wheezing or shortness of breath. 75 mL 12  . ipratropium (ATROVENT) 0.03 % nasal spray Place 2 sprays into both nostrils 2 (two) times daily. Reported on 12/19/2015  11  . nadolol (CORGARD) 40 MG tablet Take 20 mg by mouth daily.     . nortriptyline (PAMELOR) 50 MG capsule Take 50 mg by mouth at bedtime.  0  . potassium chloride (KLOR-CON) 20 MEQ packet Take 2  tablets by mouth once a day 60 tablet 0  . sodium chloride HYPERTONIC 3 % nebulizer solution  Take by nebulization as needed for other. 750 mL 12  . tiZANidine (ZANAFLEX) 4 MG tablet Take 1 tablet (4 mg total) by mouth every 8 (eight) hours as needed for muscle spasms. 45 tablet 2  . umeclidinium-vilanterol (ANORO ELLIPTA) 62.5-25 MCG/INH AEPB Inhale 1 puff into the lungs daily. 1 each 5  . azithromycin (ZITHROMAX) 250 MG tablet Take 1 tablet (250 mg total) by mouth daily. (Patient not taking: Reported on 03/11/2016) 3 tablet 0  . cefUROXime (CEFTIN) 250 MG tablet Take 1 tablet (250 mg total) by mouth 2 (two) times daily with a meal. 20 tablet 0   No current facility-administered medications for this visit.   Social History   Social History  . Marital Status: Married    Spouse Name: N/A  . Number of Children: N/A  . Years of Education: N/A   Occupational History  . SALES ASSOCIATE     [employer]   Social History Main Topics  . Smoking status: Current Every Day Smoker -- 1.00 packs/day for 44 years    Types: Cigarettes  . Smokeless tobacco: Never Used  . Alcohol Use: 0.6 oz/week    0 Standard drinks or equivalent, 1 Glasses of wine per week  . Drug Use: No  . Sexual Activity: No   Other Topics Concern  . Not on file   Social History Narrative   Patient currently lives with her husband. She works in Scient[employer], research (medical). They do have a dog. No bird or hot tub exposure. No mold exposure. No recent travel.   Family History  Problem Relation Age of Onset  . Stroke Mother   . Heart disease Father   . Hyperlipidemia Father   . Hypertension Father   . Breast cancer Maternal Aunt   . Asthma Maternal Grandmother        Objective:    BP 136/60 mmHg  Pulse 101  Temp(Src) 98.1 F (36.7 C) (Oral)  Resp 16  Ht 5' 2.25" (1.581 m)  Wt 125 lb 6.4 oz (56.881 kg)  BMI 22.76 kg/m2  SpO2 94% Physical Exam  Constitutional: She is oriented to person, place, and time. She appears well-developed and  well-nourished. No distress.  HENT:  Head: Normocephalic and atraumatic.  Right Ear: External ear normal.  Left Ear: External ear normal.  Nose: Nose normal.  Mouth/Throat: Oropharynx is clear and moist.  Eyes: Conjunctivae and EOM are normal. Pupils are equal, round, and reactive to light.  Neck: Normal range of motion. Neck supple. Carotid bruit is not present. No thyromegaly present.  Cardiovascular: Normal rate, regular rhythm, normal heart sounds and intact distal pulses.  Exam reveals no gallop and no friction rub.   No murmur heard. Pulmonary/Chest: Effort normal and breath sounds normal. She has no wheezes. She has no rales.  Abdominal: Soft. Bowel sounds are normal. She exhibits no distension and no mass. There is no tenderness. There is no rebound and no guarding.  Lymphadenopathy:    She has no cervical adenopathy.  Neurological: She is alert and oriented to person, place, and time. No cranial nerve deficit.  Skin: Skin is warm and dry. No rash noted. She is not diaphoretic. No erythema. No pallor.  Psychiatric: She has a normal mood and affect. Her behavior is normal.   Results for orders placed or performed in visit on 03/11/16  CBC with Differential/Platelet  Result Value Ref Range   WBC 14.8 (H) 3.8 - 10.8 K/uL   RBC 4.06 3.80 - 5.10 MIL/uL   Hemoglobin  11.2 (L) 11.7 - 15.5 g/dL   HCT 34.0 (L) 35.0 - 45.0 %   MCV 83.7 80.0 - 100.0 fL   MCH 27.6 27.0 - 33.0 pg   MCHC 32.9 32.0 - 36.0 g/dL   RDW 16.0 (H) 11.0 - 15.0 %   Platelets 385 140 - 400 K/uL   MPV 8.9 7.5 - 12.5 fL   Neutro Abs 11396 (H) 1500 - 7800 cells/uL   Lymphs Abs 1776 850 - 3900 cells/uL   Monocytes Absolute 1480 (H) 200 - 950 cells/uL   Eosinophils Absolute 148 15 - 500 cells/uL   Basophils Absolute 0 0 - 200 cells/uL   Neutrophils Relative % 77 %   Lymphocytes Relative 12 %   Monocytes Relative 10 %   Eosinophils Relative 1 %   Basophils Relative 0 %   Smear Review SEE NOTE   Comprehensive  metabolic panel  Result Value Ref Range   Sodium 130 (L) 135 - 146 mmol/L   Potassium 4.1 3.5 - 5.3 mmol/L   Chloride 95 (L) 98 - 110 mmol/L   CO2 26 20 - 31 mmol/L   Glucose, Bld 79 65 - 99 mg/dL   BUN 6 (L) 7 - 25 mg/dL   Creat 0.34 (L) 0.50 - 0.99 mg/dL   Total Bilirubin 0.4 0.2 - 1.2 mg/dL   Alkaline Phosphatase 116 33 - 130 U/L   AST 17 10 - 35 U/L   ALT 11 6 - 29 U/L   Total Protein 7.4 6.1 - 8.1 g/dL   Albumin 2.6 (L) 3.6 - 5.1 g/dL   Calcium 8.5 (L) 8.6 - 10.4 mg/dL       Assessment & Plan:   1. HCAP (healthcare-associated pneumonia)   2. Acute respiratory failure with hypoxia (HCC)   3. COPD, moderate ([place])   4. Bronchiectasis with acute exacerbation ([place])   5. Venous insufficiency (chronic) (peripheral)   6. Hyponatremia   7. COPD GOLD II/ still smoking   8. Lumbar back pain with radiculopathy affecting right lower extremity     Orders Placed This Encounter  Procedures  . DG Chest 2 View    Standing Status: Future     Number of Occurrences: 1     Standing Expiration Date: 03/11/2017    Order Specific Question:  Reason for Exam (SYMPTOM  OR DIAGNOSIS REQUIRED)    Answer:  RML and RLL pneumonia follow-up    Order Specific Question:  Preferred imaging location?    Answer:  External  . CBC with Differential/Platelet  . Comprehensive metabolic panel   Meds ordered this encounter  Medications  . benzonatate (TESSALON) 100 MG capsule    Sig: TAKE 1 TO 2 CAPSULES BY MOUTH 3 TIMES DAILY AS NEEDED FOR COUGH    Dispense:  60 capsule    Refill:  2  . tiZANidine (ZANAFLEX) 4 MG tablet    Sig: Take 1 tablet (4 mg total) by mouth every 8 (eight) hours as needed for muscle spasms.    Dispense:  45 tablet    Refill:  2  . HYDROcodone-acetaminophen (NORCO/VICODIN) 5-325 MG tablet    Sig: Take 0.5-1 tablets by mouth every 6 (six) hours as needed for moderate pain.    Dispense:  40 tablet    Refill:  0  . DISCONTD: cefUROXime (CEFTIN) 250 MG tablet    Sig: Take 1  tablet (250 mg total) by mouth 2 (two) times daily with a meal.    Dispense:  20 tablet  Refill:  0    Tolerated ceftin this admission without issues    Return in about 2 months (around 05/11/2016) for recheck.    Berdina Cheever Elayne Guerin, M.D. [facility] [address] [place], [place]  [address] [phone] phone [phone] fax

## 2016-03-12 ENCOUNTER — Other Ambulatory Visit: Payer: Self-pay

## 2016-03-12 LAB — COMPREHENSIVE METABOLIC PANEL
ALBUMIN: 2.6 g/dL — AB (ref 3.6–5.1)
ALK PHOS: 116 U/L (ref 33–130)
ALT: 11 U/L (ref 6–29)
AST: 17 U/L (ref 10–35)
BILIRUBIN TOTAL: 0.4 mg/dL (ref 0.2–1.2)
BUN: 6 mg/dL — ABNORMAL LOW (ref 7–25)
CHLORIDE: 95 mmol/L — AB (ref 98–110)
CO2: 26 mmol/L (ref 20–31)
CREATININE: 0.34 mg/dL — AB (ref 0.50–0.99)
Calcium: 8.5 mg/dL — ABNORMAL LOW (ref 8.6–10.4)
Glucose, Bld: 79 mg/dL (ref 65–99)
Potassium: 4.1 mmol/L (ref 3.5–5.3)
SODIUM: 130 mmol/L — AB (ref 135–146)
TOTAL PROTEIN: 7.4 g/dL (ref 6.1–8.1)

## 2016-03-12 LAB — CBC WITH DIFFERENTIAL/PLATELET
BASOS ABS: 0 {cells}/uL (ref 0–200)
Basophils Relative: 0 %
EOS PCT: 1 %
Eosinophils Absolute: 148 cells/uL (ref 15–500)
HCT: 34 % — ABNORMAL LOW (ref 35.0–45.0)
HEMOGLOBIN: 11.2 g/dL — AB (ref 11.7–15.5)
LYMPHS ABS: 1776 {cells}/uL (ref 850–3900)
LYMPHS PCT: 12 %
MCH: 27.6 pg (ref 27.0–33.0)
MCHC: 32.9 g/dL (ref 32.0–36.0)
MCV: 83.7 fL (ref 80.0–100.0)
MONOS PCT: 10 %
MPV: 8.9 fL (ref 7.5–12.5)
Monocytes Absolute: 1480 cells/uL — ABNORMAL HIGH (ref 200–950)
NEUTROS PCT: 77 %
Neutro Abs: 11396 cells/uL — ABNORMAL HIGH (ref 1500–7800)
Platelets: 385 10*3/uL (ref 140–400)
RBC: 4.06 MIL/uL (ref 3.80–5.10)
RDW: 16 % — ABNORMAL HIGH (ref 11.0–15.0)
WBC: 14.8 10*3/uL — AB (ref 3.8–10.8)

## 2016-03-12 MED ORDER — CEFUROXIME AXETIL 250 MG PO TABS: 250.0000 mg | ORAL_TABLET | Freq: Two times a day (BID) | ORAL | Status: DC

## 2016-03-14 ENCOUNTER — Encounter: Payer: Self-pay | Admitting: Family Medicine

## 2016-03-28 ENCOUNTER — Telehealth: Payer: Self-pay

## 2016-03-28 NOTE — Telephone Encounter (Signed)
IC pt with results of labs. She states she did get a letter explaining it also. Asked about her cough: She states she continues with cough, can't see much improvement. States she is taking Mucinex, using her inhaler and using the vibrating vest rx by the pulmonologist. Any further recommendations for her cough? Message sent to Dr. Tamala Julian.

## 2016-03-31 ENCOUNTER — Ambulatory Visit: Payer: Self-pay | Admitting: Internal Medicine

## 2016-04-09 ENCOUNTER — Encounter: Payer: Self-pay | Admitting: Endocrinology

## 2016-04-11 ENCOUNTER — Inpatient Hospital Stay: Admission: RE | Admit: 2016-04-11 | Payer: Self-pay | Source: Ambulatory Visit

## 2016-04-18 ENCOUNTER — Ambulatory Visit: Payer: Self-pay | Admitting: Adult Health

## 2016-04-22 ENCOUNTER — Other Ambulatory Visit: Payer: Self-pay | Admitting: *Deleted

## 2016-04-22 MED ORDER — NADOLOL 40 MG PO TABS: 20.0000 mg | ORAL_TABLET | Freq: Every day | ORAL | Status: DC

## 2016-05-01 ENCOUNTER — Encounter: Payer: Self-pay | Admitting: Cardiovascular Disease

## 2016-05-10 ENCOUNTER — Ambulatory Visit (INDEPENDENT_AMBULATORY_CARE_PROVIDER_SITE_OTHER): Payer: Medicare Other

## 2016-05-10 ENCOUNTER — Ambulatory Visit (INDEPENDENT_AMBULATORY_CARE_PROVIDER_SITE_OTHER): Payer: Medicare Other | Admitting: Physician Assistant

## 2016-05-10 VITALS — BP 96/60 | HR 73 | Temp 97.5°F | Resp 16 | Ht 62.5 in | Wt 119.6 lb

## 2016-05-10 DIAGNOSIS — R509 Fever, unspecified: Secondary | ICD-10-CM

## 2016-05-10 DIAGNOSIS — R600 Localized edema: Secondary | ICD-10-CM

## 2016-05-10 DIAGNOSIS — I959 Hypotension, unspecified: Secondary | ICD-10-CM | POA: Diagnosis not present

## 2016-05-10 DIAGNOSIS — J449 Chronic obstructive pulmonary disease, unspecified: Secondary | ICD-10-CM | POA: Diagnosis not present

## 2016-05-10 DIAGNOSIS — J189 Pneumonia, unspecified organism: Secondary | ICD-10-CM | POA: Diagnosis not present

## 2016-05-10 DIAGNOSIS — R059 Cough, unspecified: Secondary | ICD-10-CM

## 2016-05-10 DIAGNOSIS — R609 Edema, unspecified: Secondary | ICD-10-CM | POA: Diagnosis not present

## 2016-05-10 DIAGNOSIS — R05 Cough: Secondary | ICD-10-CM

## 2016-05-10 DIAGNOSIS — R918 Other nonspecific abnormal finding of lung field: Secondary | ICD-10-CM | POA: Diagnosis not present

## 2016-05-10 DIAGNOSIS — E871 Hypo-osmolality and hyponatremia: Secondary | ICD-10-CM | POA: Diagnosis not present

## 2016-05-10 DIAGNOSIS — D72829 Elevated white blood cell count, unspecified: Secondary | ICD-10-CM | POA: Diagnosis not present

## 2016-05-10 LAB — POCT CBC
Granulocyte percent: 83.8 %G — AB (ref 37–80)
HEMATOCRIT: 32.2 % — AB (ref 37.7–47.9)
HEMOGLOBIN: 10.9 g/dL — AB (ref 12.2–16.2)
LYMPH, POC: 2.1 (ref 0.6–3.4)
MCH: 28.5 pg (ref 27–31.2)
MCHC: 33.9 g/dL (ref 31.8–35.4)
MCV: 83.9 fL (ref 80–97)
MID (CBC): 0.7 (ref 0–0.9)
MPV: 6.7 fL (ref 0–99.8)
PLATELET COUNT, POC: 407 10*3/uL (ref 142–424)
POC Granulocyte: 14.2 — AB (ref 2–6.9)
POC LYMPH PERCENT: 12.2 %L (ref 10–50)
POC MID %: 4 % (ref 0–12)
RBC: 3.83 M/uL — AB (ref 4.04–5.48)
RDW, POC: 15.2 %
WBC: 16.9 10*3/uL — AB (ref 4.6–10.2)

## 2016-05-10 MED ORDER — AMOXICILLIN-POT CLAVULANATE 875-125 MG PO TABS: 1.0000 | ORAL_TABLET | Freq: Two times a day (BID) | ORAL | Status: DC

## 2016-05-10 MED ORDER — ALBUTEROL SULFATE (2.5 MG/3ML) 0.083% IN NEBU: 2.5000 mg | INHALATION_SOLUTION | Freq: Four times a day (QID) | RESPIRATORY_TRACT | Status: DC | PRN

## 2016-05-10 MED ORDER — ALIGN PO CAPS: 1.0000 | ORAL_CAPSULE | Freq: Every day | ORAL | Status: DC

## 2016-05-10 MED ORDER — BENZONATATE 100 MG PO CAPS: ORAL_CAPSULE | ORAL | Status: DC

## 2016-05-10 MED ORDER — ALBUTEROL SULFATE (2.5 MG/3ML) 0.083% IN NEBU
2.5000 mg | INHALATION_SOLUTION | Freq: Once | RESPIRATORY_TRACT | Status: AC
  Administered 2016-05-10: 2.5 mg via RESPIRATORY_TRACT

## 2016-05-10 MED ORDER — IPRATROPIUM BROMIDE 0.02 % IN SOLN
0.5000 mg | Freq: Once | RESPIRATORY_TRACT | Status: AC
Start: 2016-05-10 — End: 2016-05-10
  Administered 2016-05-10: 0.5 mg via RESPIRATORY_TRACT

## 2016-05-10 MED ORDER — IPRATROPIUM BROMIDE 0.02 % IN SOLN: 0.5000 mg | Freq: Four times a day (QID) | RESPIRATORY_TRACT | Status: DC | PRN

## 2016-05-10 NOTE — Progress Notes (Signed)
Catherine Kelley  MRN: [id number] DOB: Sep 08, 1951  Subjective:  Pt presents to clinic with 2 week h/o chest congestion with yellow and green sputum.  She has had recurrent pneumonias with multiple hospitalizations the most recent being 02/12/2016.  She started with chest congestion about 2 weeks ago that has not really changed.  She has been running a fever of the same time frame. She has decreased energy and some SOB but she is not wheezing.  She has not been using her Ellipta or Flonase and she has been out of her albuterol for her neb and has not been able to use it.  She has been using her mucinex daily. She continues to smoke about 1/4 ppd.  She retired in April.  Patient Active Problem List   Diagnosis Date Noted  . Tobacco abuse 02/12/2016  . Other fatigue 02/04/2016  . Respiratory failure with hypoxia ([place]) 01/09/2016  . Malnutrition of moderate degree 11/03/2015  . Pulmonary emphysema ([place])   . Community acquired pneumonia   . Pneumonia 11/01/2015  . Essential hypertension 09/27/2015  . Rhabdomyolysis 08/16/2015  . Chronic cough 06/07/2015  . Bronchiectasis with acute exacerbation ([place]) 03/07/2015  . COPD, moderate ([place]) 12/13/2014  . Cigarette smoker 12/13/2014  . COPD GOLD II/ still smoking 10/11/2014  . Lumbar back pain with radiculopathy affecting right lower extremity 09/28/2014  . Hyponatremia 07/09/2014  . Hx of recurrent pneumonia 07/08/2014  . Venous insufficiency (chronic) (peripheral) 09/29/2012  . Cellulitis and abscess of foot, except toes 09/21/2012  . Edema 03/09/2012  . Benign hypertensive heart disease without heart failure 05/09/2011  . Chest pain 05/09/2011  . Dyslipidemia 05/09/2011  . Depression 05/09/2011  . Osteoarthritis 05/09/2011    Current Outpatient Prescriptions on File Prior to Visit  Medication Sig Dispense Refill  . ALPRAZolam (XANAX) 0.5 MG tablet Take 0.5 mg by mouth at bedtime. Reported on 05/10/2016    . divalproex (DEPAKOTE ER) 500 MG 24  hr tablet Take 500 mg by mouth at bedtime.     . furosemide (LASIX) 20 MG tablet TAKE 2 TABLETS BY MOUTH EVERY DAY (Patient taking differently: Take 20 mg by mouth daily) 180 tablet 1  . guaiFENesin (MUCINEX) 600 MG 12 hr tablet Take 1 tablet (600 mg total) by mouth 2 (two) times daily. 60 tablet 0  . nadolol (CORGARD) 40 MG tablet Take 0.5 tablets (20 mg total) by mouth daily. 45 tablet 0  . nortriptyline (PAMELOR) 50 MG capsule Take 50 mg by mouth at bedtime.  0  . tiZANidine (ZANAFLEX) 4 MG tablet Take 1 tablet (4 mg total) by mouth every 8 (eight) hours as needed for muscle spasms. 45 tablet 2  . fluticasone (FLONASE) 50 MCG/ACT nasal spray Place 2 sprays into both nostrils daily. Reported on 05/10/2016    . HYDROcodone-acetaminophen (NORCO/VICODIN) 5-325 MG tablet Take 0.5-1 tablets by mouth every 6 (six) hours as needed for moderate pain. (Patient not taking: Reported on 05/10/2016) 40 tablet 0  . sodium chloride HYPERTONIC 3 % nebulizer solution Take by nebulization as needed for other. (Patient not taking: Reported on 05/10/2016) 750 mL 12  . umeclidinium-vilanterol (ANORO ELLIPTA) 62.5-25 MCG/INH AEPB Inhale 1 puff into the lungs daily. (Patient not taking: Reported on 05/10/2016) 1 each 5   No current facility-administered medications on file prior to visit.    Allergies  Allergen Reactions  . Ceclor [Cefaclor] Hives    Tolerated ceftazidime December 2016  . Escitalopram Oxalate     Pt does not  recall ever taking medication  . Sertraline Hcl     Severe Headache  . Sulfa Drugs Cross Reactors Hives and Rash    Hives and rash    Review of Systems  Constitutional: Positive for fever (101) and chills.  HENT: Negative for congestion.   Respiratory: Positive for cough and shortness of breath.    Objective:  BP 96/60 mmHg  Pulse 73  Temp(Src) 97.5 F (36.4 C) (Oral)  Resp 16  Ht 5' 2.5" (1.588 m)  Wt 119 lb 9.6 oz (54.25 kg)  BMI 21.51 kg/m2  SpO2 93%  Physical Exam    Constitutional: She is oriented to person, place, and time and well-developed, well-nourished, and in no distress.  HENT:  Head: Normocephalic and atraumatic.  Right Ear: Hearing, tympanic membrane, external ear and ear canal normal.  Left Ear: Hearing, tympanic membrane, external ear and ear canal normal.  Nose: Nose normal.  Mouth/Throat: Uvula is midline, oropharynx is clear and moist and mucous membranes are normal.  Eyes: Conjunctivae are normal.  Neck: Normal range of motion.  Cardiovascular: Normal rate, regular rhythm and normal heart sounds.   No murmur heard. Pulmonary/Chest: Effort normal. She has no wheezes. She has no rales.  Decreased breath sounds at bases.  Expiration phase great than inspiratory phase on exam.  Neurological: She is alert and oriented to person, place, and time. Gait normal.  Skin: Skin is warm and dry.  Psychiatric: Mood, memory, affect and judgment normal.  Vitals reviewed.  Results for orders placed or performed in visit on 05/10/16  POCT CBC  Result Value Ref Range   WBC 16.9 (A) 4.6 - 10.2 K/uL   Lymph, poc 2.1 0.6 - 3.4   POC LYMPH PERCENT 12.2 10 - 50 %L   MID (cbc) 0.7 0 - 0.9   POC MID % 4.0 0 - 12 %M   POC Granulocyte 14.2 (A) 2 - 6.9   Granulocyte percent 83.8 (A) 37 - 80 %G   RBC 3.83 (A) 4.04 - 5.48 M/uL   Hemoglobin 10.9 (A) 12.2 - 16.2 g/dL   HCT, POC 32.2 (A) 37.7 - 47.9 %   MCV 83.9 80 - 97 fL   MCH, POC 28.5 27 - 31.2 pg   MCHC 33.9 31.8 - 35.4 g/dL   RDW, POC 15.2 %   Platelet Count, POC 407 142 - 424 K/uL   MPV 6.7 0 - 99.8 fL   CXR: IMPRESSION: Chronic bronchiectasis and opacities in the right middle lobe and right base. Previous CT imaging better demonstrated these findings and also demonstrated cavitary nodular opacities.  Assessment and Plan :  Cough - Plan: DG Chest 2 View, POCT CBC, albuterol (PROVENTIL) (2.5 MG/3ML) 0.083% nebulizer solution 2.5 mg, ipratropium (ATROVENT) nebulizer solution 0.5 mg, benzonatate  (TESSALON) 100 MG capsule - we will cover for pneumonia in her setting of fever and COPD and h/o recurrent PNA.  We will start her a albuterol/atrovent that she will current do 4x/day but than once she is well switch to bid dosing.  She should start on Mucinex to help clear the mucus from her chest.  Recurrent pneumonia - Plan: amoxicillin-clavulanate (AUGMENTIN) 875-125 MG tablet, bifidobacterium infantis (ALIGN) capsule - start probiotic due to recurrent abx to prevent GI problems  Fever and chills - Plan: DG Chest 2 View, POCT CBC  Hypotension, unspecified hypotension type  COPD GOLD II/ still smoking - Plan: albuterol (PROVENTIL) (2.5 MG/3ML) 0.083% nebulizer solution, ipratropium (ATROVENT) 0.02 % nebulizer solution  Leukocytosis -  Plan: amoxicillin-clavulanate (AUGMENTIN) 875-125 MG tablet  D/w pt that we want to recheck in 48h unless she is worse than she needs to go the ED tomorrow.  Pt is hesitant about the return to clinic due to transportation but we discussed that this is way she can not go to the hospital today.  We will need to recheck her blood count and make sure the fevers are resolving at that visit.  She agrees with the plan and understands what to do at home.    D/w Dr Nell Range PA-C  [facility] 05/10/2016 4:10 PM

## 2016-05-10 NOTE — Patient Instructions (Addendum)
Stop the Nadolol until you see me on Monday am  Tae 1/2 Lasix until Monday when you see me  We will start the antibiotic Augmentin - please start probiotic (Align)  Use nebulizer every 6 hours  Continue Mucinex  If you feel worse tomorrow please go to the ED   IF you received an x-ray today, you will receive an invoice from [facility]. Please contact [facility] at [phone] with questions or concerns regarding your invoice.   IF you received labwork today, you will receive an invoice from [employer]. Please contact [facility] at [phone] with questions or concerns regarding your invoice.   Our billing staff will not be able to assist you with questions regarding bills from these companies.  You will be contacted with the lab results as soon as they are available. The fastest way to get your results is to activate your My Chart account. Instructions are located on the last page of this paperwork. If you have not heard from [name] regarding the results in 2 weeks, please contact this office.

## 2016-05-12 LAB — COMPLETE METABOLIC PANEL WITH GFR
ALT: 6 U/L (ref 6–29)
AST: 11 U/L (ref 10–35)
Albumin: 2.6 g/dL — ABNORMAL LOW (ref 3.6–5.1)
Alkaline Phosphatase: 118 U/L (ref 33–130)
BILIRUBIN TOTAL: 0.3 mg/dL (ref 0.2–1.2)
BUN: 3 mg/dL — ABNORMAL LOW (ref 7–25)
CALCIUM: 8.2 mg/dL — AB (ref 8.6–10.4)
CHLORIDE: 90 mmol/L — AB (ref 98–110)
CO2: 29 mmol/L (ref 20–31)
Creat: 0.38 mg/dL — ABNORMAL LOW (ref 0.50–0.99)
Glucose, Bld: 113 mg/dL — ABNORMAL HIGH (ref 65–99)
Potassium: 2.9 mmol/L — ABNORMAL LOW (ref 3.5–5.3)
Sodium: 131 mmol/L — ABNORMAL LOW (ref 135–146)
Total Protein: 7 g/dL (ref 6.1–8.1)

## 2016-05-12 LAB — BRAIN NATRIURETIC PEPTIDE: BRAIN NATRIURETIC PEPTIDE: 89.7 pg/mL (ref <100)

## 2016-05-16 DIAGNOSIS — Z0271 Encounter for disability determination: Secondary | ICD-10-CM

## 2016-05-17 ENCOUNTER — Ambulatory Visit (INDEPENDENT_AMBULATORY_CARE_PROVIDER_SITE_OTHER): Payer: Medicare Other | Admitting: Physician Assistant

## 2016-05-17 VITALS — BP 98/68 | HR 86 | Temp 97.7°F | Resp 17 | Ht 62.5 in | Wt 119.0 lb

## 2016-05-17 DIAGNOSIS — R05 Cough: Secondary | ICD-10-CM | POA: Diagnosis not present

## 2016-05-17 DIAGNOSIS — E871 Hypo-osmolality and hyponatremia: Secondary | ICD-10-CM

## 2016-05-17 DIAGNOSIS — R059 Cough, unspecified: Secondary | ICD-10-CM

## 2016-05-17 DIAGNOSIS — E876 Hypokalemia: Secondary | ICD-10-CM | POA: Diagnosis not present

## 2016-05-17 DIAGNOSIS — E8809 Other disorders of plasma-protein metabolism, not elsewhere classified: Secondary | ICD-10-CM

## 2016-05-17 DIAGNOSIS — I959 Hypotension, unspecified: Secondary | ICD-10-CM | POA: Diagnosis not present

## 2016-05-17 DIAGNOSIS — R600 Localized edema: Secondary | ICD-10-CM | POA: Diagnosis not present

## 2016-05-17 DIAGNOSIS — R609 Edema, unspecified: Secondary | ICD-10-CM | POA: Diagnosis not present

## 2016-05-17 LAB — CBC WITH DIFFERENTIAL/PLATELET
BASOS ABS: 0 {cells}/uL (ref 0–200)
Basophils Relative: 0 %
EOS ABS: 85 {cells}/uL (ref 15–500)
Eosinophils Relative: 1 %
HEMATOCRIT: 34.1 % — AB (ref 35.0–45.0)
HEMOGLOBIN: 11.1 g/dL — AB (ref 11.7–15.5)
LYMPHS ABS: 1615 {cells}/uL (ref 850–3900)
Lymphocytes Relative: 19 %
MCH: 27.9 pg (ref 27.0–33.0)
MCHC: 32.6 g/dL (ref 32.0–36.0)
MCV: 85.7 fL (ref 80.0–100.0)
MONO ABS: 595 {cells}/uL (ref 200–950)
MPV: 8.3 fL (ref 7.5–12.5)
Monocytes Relative: 7 %
NEUTROS ABS: 6205 {cells}/uL (ref 1500–7800)
NEUTROS PCT: 73 %
Platelets: 305 10*3/uL (ref 140–400)
RBC: 3.98 MIL/uL (ref 3.80–5.10)
RDW: 15.1 % — ABNORMAL HIGH (ref 11.0–15.0)
WBC: 8.5 10*3/uL (ref 3.8–10.8)

## 2016-05-17 LAB — COMPLETE METABOLIC PANEL WITH GFR
ALBUMIN: 2.7 g/dL — AB (ref 3.6–5.1)
ALK PHOS: 111 U/L (ref 33–130)
ALT: 7 U/L (ref 6–29)
AST: 15 U/L (ref 10–35)
BILIRUBIN TOTAL: 0.2 mg/dL (ref 0.2–1.2)
BUN: 2 mg/dL — ABNORMAL LOW (ref 7–25)
CALCIUM: 8.4 mg/dL — AB (ref 8.6–10.4)
CO2: 31 mmol/L (ref 20–31)
Chloride: 98 mmol/L (ref 98–110)
Creat: 0.44 mg/dL — ABNORMAL LOW (ref 0.50–0.99)
GFR, Est African American: >89 mL/min (ref >=60)
GLUCOSE: 83 mg/dL (ref 65–99)
POTASSIUM: 3.4 mmol/L — AB (ref 3.5–5.3)
Sodium: 137 mmol/L (ref 135–146)
TOTAL PROTEIN: 7.3 g/dL (ref 6.1–8.1)

## 2016-05-17 LAB — TSH: TSH: 1.39 mIU/L

## 2016-05-17 MED ORDER — MEDICAL COMPRESSION SOCKS MISC: 1.0000 | Freq: Once | Status: DC

## 2016-05-17 MED ORDER — SPIRONOLACTONE 50 MG PO TABS: 50.0000 mg | ORAL_TABLET | Freq: Every day | ORAL | Status: DC

## 2016-05-17 MED ORDER — POTASSIUM CHLORIDE CRYS ER 10 MEQ PO TBCR: 10.0000 meq | EXTENDED_RELEASE_TABLET | Freq: Every day | ORAL | Status: DC

## 2016-05-17 NOTE — Patient Instructions (Addendum)
  Stop the Nadalol Change the Lasix to spironolactone Start your potassium daily Continue albuterol neb 2x/day Continue atrovent in the morning  You must come back in and see me in a week!! For labs  Got to [facility] for compression socks [facility] Address: [address] [place], [place] [address]  Phone: [phone]  Start drinking at least 1 ensure a day for improved nutrition   IF you received an x-ray today, you will receive an invoice from [facility]. Please contact [facility] at [phone] with questions or concerns regarding your invoice.   IF you received labwork today, you will receive an invoice from [employer]. Please contact [facility] at [phone] with questions or concerns regarding your invoice.   Our billing staff will not be able to assist you with questions regarding bills from these companies.  You will be contacted with the lab results as soon as they are available. The fastest way to get your results is to activate your My Chart account. Instructions are located on the last page of this paperwork. If you have not heard from [name] regarding the results in 2 weeks, please contact this office.

## 2016-05-17 NOTE — Progress Notes (Signed)
Catherine Kelley  MRN: [id number] DOB: 05-01-1951  Subjective:  Patient is a 65 yo female with a history of recurrent pneumonias with multiple hospitalizations, the most recent being 02/12/2016, who presents to clinic for concern of lower extremity edema and blood pressure. Was seen here on 05/10/16 for a 2 week history of chest congestion, fever, and productive cough. Was diagnosed w/ pneumonia. During visit, blood pressure was low. Was treated with Augmentin and instructed to hold blood pressure medication and lasix for 48hr then to return for follow up. Labs from this visit revealed hypokalemia and hyponatremia.    Pt did not follow up as instructed. She was worried about her blood pressure and edema so she continued both the nadolol and lasix. Today patient reports that in terms of pneumonia, she feels much better. States that she is no longer having fever, chills, or SOB. States that she has been having diarrhea since Augmentin was initiated but it is getting progressively better. Also notes that productive cough is still present, which is typical for the patient. Reports intermittent dizziness, especially when standing up from bed. Her biggest concern today is her lower extremity edema as she states it is worse than normal and it is uncomfortable.   She continues to smoke about a 1/4 ppd - she stopped for a few weeks but about 2 weeks ago she restarted - she has not restarted her Ellipta - she has been using her atrovent and albuterol and it does make her feel better.  Patient Active Problem List   Diagnosis Date Noted  . Tobacco abuse 02/12/2016  . Other fatigue 02/04/2016  . Respiratory failure with hypoxia ([place]) 01/09/2016  . Malnutrition of moderate degree 11/03/2015  . Pulmonary emphysema ([place])   . Community acquired pneumonia   . Pneumonia 11/01/2015  . Essential hypertension 09/27/2015  . Rhabdomyolysis 08/16/2015  . Chronic cough 06/07/2015  . Bronchiectasis with acute exacerbation  ([place]) 03/07/2015  . COPD, moderate ([place]) 12/13/2014  . Cigarette smoker 12/13/2014  . COPD GOLD II/ still smoking 10/11/2014  . Lumbar back pain with radiculopathy affecting right lower extremity 09/28/2014  . Hyponatremia 07/09/2014  . Hx of recurrent pneumonia 07/08/2014  . Venous insufficiency (chronic) (peripheral) 09/29/2012  . Cellulitis and abscess of foot, except toes 09/21/2012  . Edema 03/09/2012  . Benign hypertensive heart disease without heart failure 05/09/2011  . Chest pain 05/09/2011  . Dyslipidemia 05/09/2011  . Depression 05/09/2011  . Osteoarthritis 05/09/2011    Current Outpatient Prescriptions on File Prior to Visit  Medication Sig Dispense Refill  . albuterol (PROVENTIL) (2.5 MG/3ML) 0.083% nebulizer solution Take 3 mLs (2.5 mg total) by nebulization every 6 (six) hours as needed for shortness of breath. 150 mL 0  . ALPRAZolam (XANAX) 0.5 MG tablet Take 0.5 mg by mouth at bedtime. Reported on 05/10/2016    . amoxicillin-clavulanate (AUGMENTIN) 875-125 MG tablet Take 1 tablet by mouth 2 (two) times daily. 20 tablet 0  . benzonatate (TESSALON) 100 MG capsule TAKE 1 TO 2 CAPSULES BY MOUTH 3 TIMES DAILY AS NEEDED FOR COUGH 60 capsule 2  . bifidobacterium infantis (ALIGN) capsule Take 1 capsule by mouth daily. 30 capsule 5  . divalproex (DEPAKOTE ER) 500 MG 24 hr tablet Take 500 mg by mouth at bedtime.     . fluticasone (FLONASE) 50 MCG/ACT nasal spray Place 2 sprays into both nostrils daily. Reported on 05/10/2016    . furosemide (LASIX) 20 MG tablet TAKE 2 TABLETS BY MOUTH EVERY DAY (  Patient taking differently: Take 20 mg by mouth daily) 180 tablet 1  . guaiFENesin (MUCINEX) 600 MG 12 hr tablet Take 1 tablet (600 mg total) by mouth 2 (two) times daily. 60 tablet 0  . ipratropium (ATROVENT) 0.02 % nebulizer solution Take 2.5 mLs (0.5 mg total) by nebulization every 6 (six) hours as needed for shortness of breath. 75 mL 0  . nadolol (CORGARD) 40 MG tablet Take 0.5 tablets  (20 mg total) by mouth daily. 45 tablet 0  . nortriptyline (PAMELOR) 50 MG capsule Take 50 mg by mouth at bedtime.  0  . tiZANidine (ZANAFLEX) 4 MG tablet Take 1 tablet (4 mg total) by mouth every 8 (eight) hours as needed for muscle spasms. 45 tablet 2  . HYDROcodone-acetaminophen (NORCO/VICODIN) 5-325 MG tablet Take 0.5-1 tablets by mouth every 6 (six) hours as needed for moderate pain. (Patient not taking: Reported on 05/10/2016) 40 tablet 0  . sodium chloride HYPERTONIC 3 % nebulizer solution Take by nebulization as needed for other. (Patient not taking: Reported on 05/10/2016) 750 mL 12  . umeclidinium-vilanterol (ANORO ELLIPTA) 62.5-25 MCG/INH AEPB Inhale 1 puff into the lungs daily. (Patient not taking: Reported on 05/10/2016) 1 each 5   No current facility-administered medications on file prior to visit.    Allergies  Allergen Reactions  . Ceclor [Cefaclor] Hives    Tolerated ceftazidime December 2016  . Escitalopram Oxalate     Pt does not recall ever taking medication  . Sertraline Hcl     Severe Headache  . Sulfa Drugs Cross Reactors Hives and Rash    Hives and rash    Review of Systems  Constitutional: Negative for chills and appetite change.  Cardiovascular: Negative for chest pain and palpitations.  Gastrointestinal: Negative for abdominal pain.  Musculoskeletal: Negative for myalgias.  Neurological: Negative for weakness.   Objective:  BP 98/68 mmHg  Pulse 86  Temp(Src) 97.7 F (36.5 C) (Oral)  Resp 17  Ht 5' 2.5" (1.588 m)  Wt 119 lb (53.978 kg)  BMI 21.41 kg/m2  SpO2 98%  Physical Exam  Constitutional: She is oriented to person, place, and time.  Frail female appearing older than stated age.   HENT:  Dry mucous membranes.   Eyes: Conjunctivae are normal. Pupils are equal, round, and reactive to light. No scleral icterus.  Cardiovascular: Normal rate, regular rhythm, normal heart sounds and intact distal pulses.   Pulmonary/Chest:  Breath sounds  diminished diffusely. Rales noted in anterior and posterior right lower lobe.  Abdominal: Soft. Bowel sounds are normal.  Musculoskeletal: She exhibits no tenderness.  Lymphadenopathy:       Head (right side): No tonsillar, no preauricular, no posterior auricular and no occipital adenopathy present.       Head (left side): No tonsillar, no preauricular, no posterior auricular and no occipital adenopathy present.    She has no cervical adenopathy.       Right: No supraclavicular adenopathy present.       Left: No supraclavicular adenopathy present.  Neurological: She is alert and oriented to person, place, and time. Gait normal.  Skin: Skin is warm.  Poor skin turgor. venostasis changed of darkened shiny skin with hair loss distal legs    Psychiatric: Affect normal.    Assessment and Plan :  1. Hypokalemia -Potassium level on 05/10/16 was 2.9  -COMPLETE METABOLIC PANEL WITH GFR -Prescribed KCl 10 mEq tablet po qd x 10d - will do a short term to increase her KCl as we  start her on the potassium sparing spironolactone  2. Hyponatremia -Sodium level on 05/10/16 was 131 -COMPLETE METABOLIC PANEL WITH GFR Recheck today -   3. Hypoalbuminemia -Albumin level on 05/10/16 was 2.6 -COMPLETE METABOLIC PANEL WITH GFR -Patient instructed to consume at least one ensure daily, preferably two - pt is likely malnurished and this is partial cause of her edema  4. Hypotension, unspecified hypotension type -BP today is 98/68 -Will continue to hold nadolol - we will stop lasix as she appears dehydrated her BP is low and she is still having leg swelling  5. Cough -Cough is productive with yellow/green sputum. Patient notes that this is typical due to previous incident of necrotic pneumonia. - CBC with Differential/Platelet - Instructed to continue atrovent once and a day and proventil twice a day for difficulty breathing. Continue Augmentin until regimen is complete.   6. Bilateral edema of lower  extremity -  This is likely to be multi-factorial with her low protein and likely - mal-nurished state along with her venostasis disease - she declines seeing vascular at this time though I think that she would benefit from this -Patient has bilateral 2+ pitting edema in lower extremities extending 2/3 of the shin - Brain natriuretic peptide - TSH -Discontinued lasix and prescribed spironolactone 50 mg tablet po daily and medium support compression stockings to help with swelling    Patient instructed to follow-up in one week for repeat of labs and BP - discussed with patient the important of f/u for labs and continued care.   Windell Hummingbird PA-C  [facility] 05/19/2016 7:29 PM

## 2016-05-18 LAB — BRAIN NATRIURETIC PEPTIDE: BRAIN NATRIURETIC PEPTIDE: 81.9 pg/mL (ref <100)

## 2016-05-23 ENCOUNTER — Ambulatory Visit (INDEPENDENT_AMBULATORY_CARE_PROVIDER_SITE_OTHER): Payer: Medicare Other | Admitting: Physician Assistant

## 2016-05-23 VITALS — BP 116/72 | HR 101 | Temp 98.0°F | Resp 17 | Ht 62.5 in | Wt 115.0 lb

## 2016-05-23 DIAGNOSIS — F411 Generalized anxiety disorder: Secondary | ICD-10-CM | POA: Diagnosis not present

## 2016-05-23 DIAGNOSIS — F329 Major depressive disorder, single episode, unspecified: Secondary | ICD-10-CM | POA: Diagnosis not present

## 2016-05-23 DIAGNOSIS — E876 Hypokalemia: Secondary | ICD-10-CM

## 2016-05-23 DIAGNOSIS — G4700 Insomnia, unspecified: Secondary | ICD-10-CM

## 2016-05-23 DIAGNOSIS — F32A Depression, unspecified: Secondary | ICD-10-CM

## 2016-05-23 DIAGNOSIS — R600 Localized edema: Secondary | ICD-10-CM | POA: Diagnosis not present

## 2016-05-23 LAB — BASIC METABOLIC PANEL
BUN: 8 mg/dL (ref 7–25)
CHLORIDE: 97 mmol/L — AB (ref 98–110)
CO2: 22 mmol/L (ref 20–31)
CREATININE: 0.54 mg/dL (ref 0.50–0.99)
Calcium: 8.5 mg/dL — ABNORMAL LOW (ref 8.6–10.4)
Glucose, Bld: 88 mg/dL (ref 65–99)
Potassium: 5.1 mmol/L (ref 3.5–5.3)
SODIUM: 130 mmol/L — AB (ref 135–146)

## 2016-05-23 NOTE — Progress Notes (Signed)
Catherine Kelley  MRN: [id number] DOB: August 24, 1951  Subjective:  Pt presents to clinic for follow up on swelling in legs. She has lost 4 lbs since last week. She is concerned about this and things that the worrying is not helping. Drinking one ensure daily. Eating breakfast, lunch, dinner. No appetite changes. Husband is at work during the day and she does not like being home by herself, this causes her to stress out more.   Taking spironolactone: pt states that this has helped out a lot. Using the bathroom every couple of hours. Was instructed last viist to elevated feet every hour and she states she was doing that until last night and after not doing it she noticed a little tightness in her ankles. Is still taking the potassium, which was dosed for a 10 d regimen.   Anxiety/worrying over every little thing has gotten worse for the past two weeks. She takes xanax at night before bed. Sees a psychiatrist every six months but now she is on medicare and she is worried that she wont be able to be seen because of cost since the psychiatrist does not take medicare. Wondering if she could be referred. This psychiatrist prescribes nortriptyline, depakote, and xanax for the patient. She has enough medications to last until July 31st.   Has started taking zanaflex the past month before bed to sleep better and is wondering if this is a reason for her hands trembling.     Patient Active Problem List   Diagnosis Date Noted  . Tobacco abuse 02/12/2016  . Other fatigue 02/04/2016  . Respiratory failure with hypoxia ([place]) 01/09/2016  . Malnutrition of moderate degree 11/03/2015  . Pulmonary emphysema ([place])   . Community acquired pneumonia   . Pneumonia 11/01/2015  . Essential hypertension 09/27/2015  . Rhabdomyolysis 08/16/2015  . Chronic cough 06/07/2015  . Bronchiectasis with acute exacerbation ([place]) 03/07/2015  . COPD, moderate ([place]) 12/13/2014  . Cigarette smoker 12/13/2014  . COPD GOLD II/ still  smoking 10/11/2014  . Lumbar back pain with radiculopathy affecting right lower extremity 09/28/2014  . Hyponatremia 07/09/2014  . Hx of recurrent pneumonia 07/08/2014  . Venous insufficiency (chronic) (peripheral) 09/29/2012  . Cellulitis and abscess of foot, except toes 09/21/2012  . Edema 03/09/2012  . Benign hypertensive heart disease without heart failure 05/09/2011  . Chest pain 05/09/2011  . Dyslipidemia 05/09/2011  . Depression 05/09/2011  . Osteoarthritis 05/09/2011    Current Outpatient Prescriptions on File Prior to Visit  Medication Sig Dispense Refill  . albuterol (PROVENTIL) (2.5 MG/3ML) 0.083% nebulizer solution Take 3 mLs (2.5 mg total) by nebulization every 6 (six) hours as needed for shortness of breath. 150 mL 0  . ALPRAZolam (XANAX) 0.5 MG tablet Take 0.5 mg by mouth at bedtime. Reported on 05/10/2016    . benzonatate (TESSALON) 100 MG capsule TAKE 1 TO 2 CAPSULES BY MOUTH 3 TIMES DAILY AS NEEDED FOR COUGH 60 capsule 2  . bifidobacterium infantis (ALIGN) capsule Take 1 capsule by mouth daily. 30 capsule 5  . divalproex (DEPAKOTE ER) 500 MG 24 hr tablet Take 500 mg by mouth at bedtime.     Water engineer Bandages & Supports (MEDICAL COMPRESSION SOCKS) MISC 1 Device by Does not apply route once. 1 each 1  . fluticasone (FLONASE) 50 MCG/ACT nasal spray Place 2 sprays into both nostrils daily. Reported on 05/10/2016    . guaiFENesin (MUCINEX) 600 MG 12 hr tablet Take 1 tablet (600 mg total) by mouth 2 (  two) times daily. 60 tablet 0  . HYDROcodone-acetaminophen (NORCO/VICODIN) 5-325 MG tablet Take 0.5-1 tablets by mouth every 6 (six) hours as needed for moderate pain. 40 tablet 0  . ipratropium (ATROVENT) 0.02 % nebulizer solution Take 2.5 mLs (0.5 mg total) by nebulization every 6 (six) hours as needed for shortness of breath. 75 mL 0  . nortriptyline (PAMELOR) 50 MG capsule Take 50 mg by mouth at bedtime.  0  . potassium chloride SA (K-DUR,KLOR-CON) 10 MEQ tablet Take 1 tablet  (10 mEq total) by mouth daily. 10 tablet 0  . sodium chloride HYPERTONIC 3 % nebulizer solution Take by nebulization as needed for other. 750 mL 12  . spironolactone (ALDACTONE) 50 MG tablet Take 1 tablet (50 mg total) by mouth daily. 30 tablet 0  . tiZANidine (ZANAFLEX) 4 MG tablet Take 1 tablet (4 mg total) by mouth every 8 (eight) hours as needed for muscle spasms. 45 tablet 2  . umeclidinium-vilanterol (ANORO ELLIPTA) 62.5-25 MCG/INH AEPB Inhale 1 puff into the lungs daily. 1 each 5  . amoxicillin-clavulanate (AUGMENTIN) 875-125 MG tablet Take 1 tablet by mouth 2 (two) times daily. (Patient not taking: Reported on 05/23/2016) 20 tablet 0  . furosemide (LASIX) 20 MG tablet TAKE 2 TABLETS BY MOUTH EVERY DAY (Patient not taking: Reported on 05/23/2016) 180 tablet 1  . nadolol (CORGARD) 40 MG tablet Take 0.5 tablets (20 mg total) by mouth daily. (Patient not taking: Reported on 05/23/2016) 45 tablet 0   No current facility-administered medications on file prior to visit.    Allergies  Allergen Reactions  . Ceclor [Cefaclor] Hives    Tolerated ceftazidime December 2016  . Escitalopram Oxalate     Pt does not recall ever taking medication  . Sertraline Hcl     Severe Headache  . Sulfa Drugs Cross Reactors Hives and Rash    Hives and rash    Review of Systems  Constitutional: Negative for fever, chills, diaphoresis, appetite change and fatigue.  Respiratory: Positive for cough (still present but not as bad as last week, still productive, much improved). Negative for shortness of breath.   Cardiovascular: Negative for chest pain.  Neurological: Negative for dizziness, light-headedness and headaches.       Worsened trembling of right and left hand   Psychiatric/Behavioral: Positive for sleep disturbance (trouble going to sleep). The patient is nervous/anxious.    Objective:  BP 116/72 mmHg  Pulse 101  Temp(Src) 98 F (36.7 C) (Oral)  Resp 17  Ht 5' 2.5" (1.588 m)  Wt 115 lb (52.164  kg)  BMI 20.69 kg/m2  SpO2 96%  Physical Exam  Constitutional: She is oriented to person, place, and time and well-developed, well-nourished, and in no distress.  HENT:  Head: Normocephalic and atraumatic.  Right Ear: External ear normal.  Eyes: Conjunctivae are normal.  Neck: Normal range of motion.  Cardiovascular: Normal rate, regular rhythm and normal heart sounds.   Pulmonary/Chest: Effort normal. She has no wheezes. She has rales (in right lower lower, consistent with findings last week).  Musculoskeletal: She exhibits no edema.  Neurological: She is alert and oriented to person, place, and time. Gait normal.  Trembling noted in R and L hand   Skin: Skin is warm and dry.  Psychiatric: Affect normal. Her mood appears anxious.    Assessment and Plan :   1. Hypokalemia - Basic metabolic panel rechecked in office today. Will review results when they come back.   2. Depression 3. Generalized anxiety disorder  4. Insomnia -Pt to contact Dr. Toy Care   5. Lower extremity edema -Pt will continue using diuretic as needed for swelling.   Tenna Delaine PA-C  [facility] 05/23/2016 3:06 PM

## 2016-05-23 NOTE — Patient Instructions (Addendum)
Call Dr Toy Care about your insurance and if she does not take see if she can do a referral to someone else who does take your insurance.  If there is no one I am willing to see you.  Please recheck with me in a month.    IF you received an x-ray today, you will receive an invoice from [facility]. Please contact [facility] at [phone] with questions or concerns regarding your invoice.   IF you received labwork today, you will receive an invoice from [employer]. Please contact [facility] at [phone] with questions or concerns regarding your invoice.   Our billing staff will not be able to assist you with questions regarding bills from these companies.  You will be contacted with the lab results as soon as they are available. The fastest way to get your results is to activate your My Chart account. Instructions are located on the last page of this paperwork. If you have not heard from [name] regarding the results in 2 weeks, please contact this office.

## 2016-05-27 ENCOUNTER — Telehealth: Payer: Self-pay | Admitting: Emergency Medicine

## 2016-05-27 NOTE — Telephone Encounter (Signed)
-----   Message from Leonie Douglas, PA-C sent at 05/26/2016  8:20 PM EDT ----- Please call pt and let her know that her lab values are in her normal range. Hope you are feeling better!

## 2016-05-27 NOTE — Telephone Encounter (Signed)
Left message with normal blood results

## 2016-05-31 ENCOUNTER — Telehealth: Payer: Self-pay | Admitting: Emergency Medicine

## 2016-05-31 NOTE — Telephone Encounter (Signed)
-----   Message from Leonie Douglas, PA-C sent at 05/20/2016  1:10 PM EDT ----- Potassium is much improved. I hope the medication is helping with your swelling.We checked your thyroid, it was normal. Your white count is also much improved.  Please continue meds as prescribed and we will see you Friday!

## 2016-05-31 NOTE — Telephone Encounter (Signed)
Left message to call for lab results. 

## 2016-06-06 ENCOUNTER — Telehealth: Payer: Self-pay

## 2016-06-06 ENCOUNTER — Encounter: Payer: Self-pay | Admitting: *Deleted

## 2016-06-06 NOTE — Telephone Encounter (Signed)
LMOVM for pt to return call to clinic to discuss lab results.

## 2016-06-06 NOTE — Telephone Encounter (Signed)
-----   Message from Leonie Douglas, PA-C sent at 05/20/2016  1:10 PM EDT ----- Potassium is much improved. I hope the medication is helping with your swelling.We checked your thyroid, it was normal. Your white count is also much improved.  Please continue meds as prescribed and we will see you Friday!

## 2016-06-17 ENCOUNTER — Telehealth: Payer: Self-pay

## 2016-06-17 DIAGNOSIS — R600 Localized edema: Secondary | ICD-10-CM

## 2016-06-17 NOTE — Telephone Encounter (Signed)
Spironolactone '50mg'$  #30 RF -0- request

## 2016-06-18 MED ORDER — SPIRONOLACTONE 50 MG PO TABS: 50.0000 mg | ORAL_TABLET | Freq: Every day | ORAL | Status: DC

## 2016-06-18 NOTE — Telephone Encounter (Signed)
LM to RTC to see Judson Roch.

## 2016-06-18 NOTE — Telephone Encounter (Signed)
Please make sure the patient comes back and sees me for lab work in the next week or so

## 2016-06-27 ENCOUNTER — Ambulatory Visit (INDEPENDENT_AMBULATORY_CARE_PROVIDER_SITE_OTHER): Payer: Medicare Other | Admitting: Physician Assistant

## 2016-06-27 VITALS — BP 128/78 | HR 89 | Temp 97.4°F | Resp 17 | Ht 62.5 in | Wt 121.0 lb

## 2016-06-27 DIAGNOSIS — I10 Essential (primary) hypertension: Secondary | ICD-10-CM | POA: Diagnosis not present

## 2016-06-27 DIAGNOSIS — Z8639 Personal history of other endocrine, nutritional and metabolic disease: Secondary | ICD-10-CM | POA: Diagnosis not present

## 2016-06-27 DIAGNOSIS — M5417 Radiculopathy, lumbosacral region: Secondary | ICD-10-CM | POA: Diagnosis not present

## 2016-06-27 DIAGNOSIS — F418 Other specified anxiety disorders: Secondary | ICD-10-CM

## 2016-06-27 DIAGNOSIS — M5416 Radiculopathy, lumbar region: Secondary | ICD-10-CM

## 2016-06-27 DIAGNOSIS — F341 Dysthymic disorder: Secondary | ICD-10-CM

## 2016-06-27 DIAGNOSIS — J449 Chronic obstructive pulmonary disease, unspecified: Secondary | ICD-10-CM | POA: Diagnosis not present

## 2016-06-27 DIAGNOSIS — F5105 Insomnia due to other mental disorder: Secondary | ICD-10-CM | POA: Diagnosis not present

## 2016-06-27 MED ORDER — ALPRAZOLAM 0.5 MG PO TABS: 0.5000 mg | ORAL_TABLET | Freq: Every day | ORAL | 0 refills | Status: DC

## 2016-06-27 MED ORDER — NORTRIPTYLINE HCL 25 MG PO CAPS: 25.0000 mg | ORAL_CAPSULE | Freq: Every day | ORAL | 0 refills | Status: DC

## 2016-06-27 MED ORDER — ALBUTEROL SULFATE (2.5 MG/3ML) 0.083% IN NEBU: 2.5000 mg | INHALATION_SOLUTION | Freq: Four times a day (QID) | RESPIRATORY_TRACT | 0 refills | Status: DC | PRN

## 2016-06-27 MED ORDER — TIZANIDINE HCL 4 MG PO TABS: 4.0000 mg | ORAL_TABLET | Freq: Three times a day (TID) | ORAL | 2 refills | Status: DC | PRN

## 2016-06-27 NOTE — Progress Notes (Signed)
Catherine Kelley  MRN: [id number] DOB: [birth year]  Subjective:  Catherine Kelley is a 65 y.o. female seen in office today for follow up on blood pressure and potassium levels.   Since last visit on 05/23/16 pt has gained five pounds.She notes that her appetite is back and she is still supplementing with ensure. She is very pleased with weight gain.  In terms of blood pressure, it is well controlled in office today. Notes that she has not had much leg swelling since the last visit. She is taking spironolactone as needed for leg swelling. This is working very well for patient. Pt denies leg swelling today. Completed supplemental potassium course of ten days given when spironolactone was initially prescribed on 05/17/16.   In terms of anxiety/depression, since previous visit, pt called [facility] and they wanted a $150 deposit the same day over the phone so she decided she could not do this and would like Windell Hummingbird to treat her for anxiety/depression.   Review of Systems  Constitutional: Negative for chills, fatigue and fever.  HENT: Positive for congestion and sinus pressure (noticed sinus pressure over the past two weeks). Negative for ear pain, sneezing and sore throat.   Respiratory: Positive for cough (has gotten much better since previous visit, still intermittent coughs). Negative for shortness of breath.   Cardiovascular: Negative for chest pain and palpitations.  Gastrointestinal: Positive for constipation (baseline for patient). Negative for diarrhea.  Neurological: Negative for dizziness, weakness and numbness.  Psychiatric/Behavioral: Negative for confusion.    Patient Active Problem List   Diagnosis Date Noted  . Tobacco abuse 02/12/2016  . Other fatigue 02/04/2016  . Respiratory failure with hypoxia ([place]) 01/09/2016  . Malnutrition of moderate degree 11/03/2015  . Pulmonary emphysema ([place])   . Community acquired pneumonia   . Pneumonia 11/01/2015  . Essential hypertension  09/27/2015  . Rhabdomyolysis 08/16/2015  . Chronic cough 06/07/2015  . Bronchiectasis with acute exacerbation ([place]) 03/07/2015  . COPD, moderate ([place]) 12/13/2014  . Cigarette smoker 12/13/2014  . COPD GOLD II/ still smoking 10/11/2014  . Lumbar back pain with radiculopathy affecting right lower extremity 09/28/2014  . Hyponatremia 07/09/2014  . Hx of recurrent pneumonia 07/08/2014  . Venous insufficiency (chronic) (peripheral) 09/29/2012  . Cellulitis and abscess of foot, except toes 09/21/2012  . Lower extremity edema 03/09/2012  . Benign hypertensive heart disease without heart failure 05/09/2011  . Chest pain 05/09/2011  . Dyslipidemia 05/09/2011  . Depression 05/09/2011  . Osteoarthritis 05/09/2011    Current Outpatient Prescriptions on File Prior to Visit  Medication Sig Dispense Refill  . albuterol (PROVENTIL) (2.5 MG/3ML) 0.083% nebulizer solution Take 3 mLs (2.5 mg total) by nebulization every 6 (six) hours as needed for shortness of breath. 150 mL 0  . ALPRAZolam (XANAX) 0.5 MG tablet Take 0.5 mg by mouth at bedtime. Reported on 05/10/2016    . amoxicillin-clavulanate (AUGMENTIN) 875-125 MG tablet Take 1 tablet by mouth 2 (two) times daily. 20 tablet 0  . benzonatate (TESSALON) 100 MG capsule TAKE 1 TO 2 CAPSULES BY MOUTH 3 TIMES DAILY AS NEEDED FOR COUGH 60 capsule 2  . bifidobacterium infantis (ALIGN) capsule Take 1 capsule by mouth daily. 30 capsule 5  . divalproex (DEPAKOTE ER) 500 MG 24 hr tablet Take 500 mg by mouth at bedtime.     Water engineer Bandages & Supports (MEDICAL COMPRESSION SOCKS) MISC 1 Device by Does not apply route once. 1 each 1  . fluticasone (FLONASE) 50 MCG/ACT nasal  spray Place 2 sprays into both nostrils daily. Reported on 05/10/2016    . furosemide (LASIX) 20 MG tablet TAKE 2 TABLETS BY MOUTH EVERY DAY 180 tablet 1  . guaiFENesin (MUCINEX) 600 MG 12 hr tablet Take 1 tablet (600 mg total) by mouth 2 (two) times daily. 60 tablet 0  .  HYDROcodone-acetaminophen (NORCO/VICODIN) 5-325 MG tablet Take 0.5-1 tablets by mouth every 6 (six) hours as needed for moderate pain. 40 tablet 0  . ipratropium (ATROVENT) 0.02 % nebulizer solution Take 2.5 mLs (0.5 mg total) by nebulization every 6 (six) hours as needed for shortness of breath. 75 mL 0  . nadolol (CORGARD) 40 MG tablet Take 0.5 tablets (20 mg total) by mouth daily. 45 tablet 0  . nortriptyline (PAMELOR) 50 MG capsule Take 50 mg by mouth at bedtime.  0  . sodium chloride HYPERTONIC 3 % nebulizer solution Take by nebulization as needed for other. 750 mL 12  . spironolactone (ALDACTONE) 50 MG tablet Take 1 tablet (50 mg total) by mouth daily. 30 tablet 0  . tiZANidine (ZANAFLEX) 4 MG tablet Take 1 tablet (4 mg total) by mouth every 8 (eight) hours as needed for muscle spasms. 45 tablet 2  . umeclidinium-vilanterol (ANORO ELLIPTA) 62.5-25 MCG/INH AEPB Inhale 1 puff into the lungs daily. 1 each 5  . potassium chloride SA (K-DUR,KLOR-CON) 10 MEQ tablet Take 1 tablet (10 mEq total) by mouth daily. (Patient not taking: Reported on 06/27/2016) 10 tablet 0   No current facility-administered medications on file prior to visit.     Allergies  Allergen Reactions  . Ceclor [Cefaclor] Hives    Tolerated ceftazidime December 2016  . Escitalopram Oxalate     Pt does not recall ever taking medication  . Sertraline Hcl     Severe Headache  . Sulfa Drugs Cross Reactors Hives and Rash    Hives and rash    Objective:  BP 128/78 (BP Location: Right Arm, Patient Position: Sitting, Cuff Size: Normal)   Pulse 89   Temp 97.4 F (36.3 C) (Oral)   Resp 17   Ht 5' 2.5" (1.588 m)   Wt 121 lb (54.9 kg)   SpO2 98%   BMI 21.78 kg/m   Physical Exam  Constitutional: She is oriented to person, place, and time and well-developed, well-nourished, and in no distress.  HENT:  Head: Normocephalic and atraumatic.  Eyes: Conjunctivae are normal.  Neck: Normal range of motion.  Cardiovascular: Normal  rate, regular rhythm, normal heart sounds and intact distal pulses.   Pulmonary/Chest: Effort normal. She has rales ( consistent with previous visit ) in the right lower field.  Musculoskeletal:       Right lower leg: She exhibits no swelling and no edema.       Left lower leg: She exhibits no swelling and no edema.  Neurological: She is alert and oriented to person, place, and time. Gait normal.  Skin: Skin is warm and dry.  Psychiatric: Affect normal.  Vitals reviewed.    Assessment and Plan :   1. Essential hypertension -Well controlled - COMPLETE METABOLIC PANEL WITH GFR -Continue spironolactone as needed for swelling  2. History of hypokalemia -Await results  - COMPLETE METABOLIC PANEL WITH GFR  3. Lumbar back pain with radiculopathy affecting right lower extremity - tiZANidine (ZANAFLEX) 4 MG tablet; Take 1 tablet (4 mg total) by mouth every 8 (eight) hours as needed for muscle spasms.  Dispense: 45 tablet; Refill: 2  4. COPD GOLD II/ still  smoking - albuterol (PROVENTIL) (2.5 MG/3ML) 0.083% nebulizer solution; Take 3 mLs (2.5 mg total) by nebulization every 6 (six) hours as needed for shortness of breath.  Dispense: 150 mL; Refill: 0  5. Insomnia secondary to depression with anxiety -Continue Xanax at bedtime - ALPRAZolam (XANAX) 0.5 MG tablet; Take 1 tablet (0.5 mg total) by mouth at bedtime. Reported on 05/10/2016  Dispense: 30 tablet; Refill: 0  6. Anxiety associated with depression -Will continue to treat here unless they significantly worsen -Add nortriptlyine '25mg'$  to the '50mg'$  to make a total of '75mg'$  - nortriptyline (PAMELOR) 25 MG capsule; Take 1 capsule (25 mg total) by mouth at bedtime.  Dispense: 30 capsule; Refill: 0 -Recheck in 3 months   Tenna Delaine PA-C  [facility] 06/27/2016 2:20 PM

## 2016-06-27 NOTE — Patient Instructions (Addendum)
  Continue Xanax at bedtime' Continue spironolactone as needed for swelling  Add nortriptlyine '25mg'$  to your '50mg'$  to make a total of '75mg'$    IF you received an x-ray today, you will receive an invoice from [facility]. Please contact [facility] at [phone] with questions or concerns regarding your invoice.   IF you received labwork today, you will receive an invoice from [employer]. Please contact [facility] at [phone] with questions or concerns regarding your invoice.   Our billing staff will not be able to assist you with questions regarding bills from these companies.  You will be contacted with the lab results as soon as they are available. The fastest way to get your results is to activate your My Chart account. Instructions are located on the last page of this paperwork. If you have not heard from [name] regarding the results in 2 weeks, please contact this office.

## 2016-06-28 LAB — COMPLETE METABOLIC PANEL WITH GFR
ALK PHOS: 177 U/L — AB (ref 33–130)
ALT: 20 U/L (ref 6–29)
AST: 27 U/L (ref 10–35)
Albumin: 3.8 g/dL (ref 3.6–5.1)
BUN: 8 mg/dL (ref 7–25)
CALCIUM: 9 mg/dL (ref 8.6–10.4)
CHLORIDE: 102 mmol/L (ref 98–110)
CO2: 26 mmol/L (ref 20–31)
CREATININE: 0.45 mg/dL — AB (ref 0.50–0.99)
GFR, Est African American: >89 mL/min (ref >=60)
GFR, Est Non African American: >89 mL/min (ref >=60)
GLUCOSE: 77 mg/dL (ref 65–99)
POTASSIUM: 4 mmol/L (ref 3.5–5.3)
SODIUM: 136 mmol/L (ref 135–146)
Total Bilirubin: 0.4 mg/dL (ref 0.2–1.2)
Total Protein: 8.1 g/dL (ref 6.1–8.1)

## 2016-07-17 ENCOUNTER — Other Ambulatory Visit: Payer: Self-pay | Admitting: Physician Assistant

## 2016-07-17 DIAGNOSIS — R600 Localized edema: Secondary | ICD-10-CM

## 2016-07-23 ENCOUNTER — Other Ambulatory Visit: Payer: Self-pay

## 2016-07-23 DIAGNOSIS — R600 Localized edema: Secondary | ICD-10-CM

## 2016-07-23 MED ORDER — SPIRONOLACTONE 50 MG PO TABS: 50.0000 mg | ORAL_TABLET | Freq: Every day | ORAL | 0 refills | Status: DC

## 2016-07-25 ENCOUNTER — Other Ambulatory Visit: Payer: Self-pay | Admitting: Physician Assistant

## 2016-07-25 DIAGNOSIS — J449 Chronic obstructive pulmonary disease, unspecified: Secondary | ICD-10-CM

## 2016-09-22 ENCOUNTER — Other Ambulatory Visit: Payer: Self-pay | Admitting: Physician Assistant

## 2016-09-22 DIAGNOSIS — J449 Chronic obstructive pulmonary disease, unspecified: Secondary | ICD-10-CM

## 2016-09-24 ENCOUNTER — Other Ambulatory Visit: Payer: Self-pay | Admitting: Physician Assistant

## 2016-09-24 DIAGNOSIS — F5105 Insomnia due to other mental disorder: Principal | ICD-10-CM

## 2016-09-24 DIAGNOSIS — F418 Other specified anxiety disorders: Secondary | ICD-10-CM

## 2016-09-25 ENCOUNTER — Other Ambulatory Visit: Payer: Self-pay

## 2016-09-25 DIAGNOSIS — F418 Other specified anxiety disorders: Secondary | ICD-10-CM

## 2016-09-25 MED ORDER — NORTRIPTYLINE HCL 25 MG PO CAPS: 25.0000 mg | ORAL_CAPSULE | Freq: Every day | ORAL | 0 refills | Status: DC

## 2016-09-26 NOTE — Telephone Encounter (Signed)
Pharmacy is requesting refill on xanax 0.5 mg was last seen in office in July by brittany. Xanax was ordered then with no refills please advise thanks

## 2016-09-26 NOTE — Telephone Encounter (Signed)
Done

## 2016-09-28 ENCOUNTER — Other Ambulatory Visit: Payer: Self-pay | Admitting: Physician Assistant

## 2016-09-28 DIAGNOSIS — F5105 Insomnia due to other mental disorder: Principal | ICD-10-CM

## 2016-09-28 DIAGNOSIS — F418 Other specified anxiety disorders: Secondary | ICD-10-CM

## 2016-09-29 NOTE — Telephone Encounter (Signed)
Message left for pt to pick up meds at pharmacy.

## 2016-10-05 DIAGNOSIS — Z23 Encounter for immunization: Secondary | ICD-10-CM | POA: Diagnosis not present

## 2016-10-21 ENCOUNTER — Ambulatory Visit (INDEPENDENT_AMBULATORY_CARE_PROVIDER_SITE_OTHER): Payer: Medicare Other | Admitting: Family Medicine

## 2016-10-21 VITALS — BP 130/80 | HR 90 | Temp 97.4°F | Resp 17 | Ht 63.0 in | Wt 132.0 lb

## 2016-10-21 DIAGNOSIS — M5416 Radiculopathy, lumbar region: Secondary | ICD-10-CM

## 2016-10-21 DIAGNOSIS — M5417 Radiculopathy, lumbosacral region: Secondary | ICD-10-CM

## 2016-10-21 DIAGNOSIS — J471 Bronchiectasis with (acute) exacerbation: Secondary | ICD-10-CM | POA: Diagnosis not present

## 2016-10-21 MED ORDER — HYDROCODONE-ACETAMINOPHEN 5-325 MG PO TABS: 0.5000 | ORAL_TABLET | Freq: Four times a day (QID) | ORAL | 0 refills | Status: DC | PRN

## 2016-10-21 NOTE — Patient Instructions (Signed)
     IF you received an x-ray today, you will receive an invoice from [facility]. Please contact  Radiology at [phone] with questions or concerns regarding your invoice.   IF you received labwork today, you will receive an invoice from [facility]. Please contact [facility] at [phone] with questions or concerns regarding your invoice.   Our billing staff will not be able to assist you with questions regarding bills from these companies.  You will be contacted with the lab results as soon as they are available. The fastest way to get your results is to activate your My Chart account. Instructions are located on the last page of this paperwork. If you have not heard from us regarding the results in 2 weeks, please contact this office.      

## 2016-10-21 NOTE — Progress Notes (Signed)
Chief Complaint  Patient presents with  . Back Pain    HPI   Pt reports that she has been having intermittent low back pain that is affecting her right hip Her pain is all on the right side She states that she has been evaluated with [facility] and was told that she has bulging discs and strained muscles She reports that she would like pain medications and this is the reason for her visit today. She reports that the pain seems to be worse today than usual. She would rate her pain as 10/10 She reports that she has muscle relaxer at home and used it over the week. It helps the pain.    She is currently on antibotics for bronchiectasis She has chronic COPD She reports that she has no fevers and is tolerating abx well Past Medical History:  Diagnosis Date  . Allergy   . Anxiety   . Asthma   . Chronic bronchitis ([place])    "get it alot; maybe not q yr" (07/06/2014)  . COPD (chronic obstructive pulmonary disease) ([place])   . Depression    psychiatrist Dr. Toy Care  . Diastolic dysfunction   . Emphysematous COPD ([place])    changes in right base along with patchy areas on last x-ray  . GERD (gastroesophageal reflux disease)   . HCAP (healthcare-associated pneumonia) 02/12/2016  . Heart murmur   . History of cardiovascular stress test 08/15/2004   EF of 70% -- Normal stress cardiolite.  There is no evidence of ischemia and there is normal LV function. -- [name]. MD  . History of echocardiogram 12/24/2006   Est. EF of 48-54% NORMAL LV SYSTOLIC FUNCTION WITH IMPAIRED RELAXATION -- MILD AORTIC SCLEROSIS -- NORMAL PALONARY ARTERY PRESSURE -- NO OLD ECHOS FOR COMPARISON -- [name], MD  . Hypertension    essential hypertension  . Necrotic pneumonia ([place])    recurrent/notes 02/12/2016  . Pneumonia    "several times since May 2015" (07/06/2014)  . Tobacco abuse    smoking about 1/2 pk of cigarettes a day    Current Outpatient Prescriptions  Medication Sig Dispense Refill  .  albuterol (PROVENTIL) (2.5 MG/3ML) 0.083% nebulizer solution TAKE 3 MLS (2.5 MG TOTAL) BY NEBULIZATION EVERY 6 (SIX) HOURS AS NEEDED FOR SHORTNESS OF BREATH. 150 mL 0  . ALPRAZolam (XANAX) 0.5 MG tablet TAKE 1 TABLET BY MOUTH AT BEDTIME 30 tablet 0  . benzonatate (TESSALON) 100 MG capsule TAKE 1 TO 2 CAPSULES BY MOUTH 3 TIMES DAILY AS NEEDED FOR COUGH 60 capsule 2  . divalproex (DEPAKOTE ER) 500 MG 24 hr tablet Take 500 mg by mouth at bedtime.     Water engineer Bandages & Supports (MEDICAL COMPRESSION SOCKS) MISC 1 Device by Does not apply route once. 1 each 1  . fluticasone (FLONASE) 50 MCG/ACT nasal spray Place 2 sprays into both nostrils daily. Reported on 05/10/2016    . guaiFENesin (MUCINEX) 600 MG 12 hr tablet Take 1 tablet (600 mg total) by mouth 2 (two) times daily. 60 tablet 0  . HYDROcodone-acetaminophen (NORCO/VICODIN) 5-325 MG tablet Take 0.5-1 tablets by mouth every 6 (six) hours as needed for moderate pain. 40 tablet 0  . nortriptyline (PAMELOR) 25 MG capsule Take 1 capsule (25 mg total) by mouth at bedtime. 30 capsule 0  . nortriptyline (PAMELOR) 50 MG capsule Take 50 mg by mouth at bedtime.  0  . spironolactone (ALDACTONE) 50 MG tablet Take 1 tablet (50 mg total) by mouth daily. 90 tablet  0  . tiZANidine (ZANAFLEX) 4 MG tablet Take 1 tablet (4 mg total) by mouth every 8 (eight) hours as needed for muscle spasms. 45 tablet 2  . umeclidinium-vilanterol (ANORO ELLIPTA) 62.5-25 MCG/INH AEPB Inhale 1 puff into the lungs daily. 1 each 5   No current facility-administered medications for this visit.     Allergies:  Allergies  Allergen Reactions  . Ceclor [Cefaclor] Hives    Tolerated ceftazidime December 2016  . Escitalopram Oxalate     Pt does not recall ever taking medication  . Sertraline Hcl     Severe Headache  . Sulfa Drugs Cross Reactors Hives and Rash    Hives and rash    Past Surgical History:  Procedure Laterality Date  . DILATION AND CURETTAGE OF UTERUS    . TUBAL  LIGATION  1986    Social History   Social History  . Marital status: Married    Spouse name: N/A  . Number of children: N/A  . Years of education: N/A   Occupational History  . 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 Smoking status: Current Every Day Smoker    Packs/day: 1.00    Years: 44.00    Types: Cigarettes  . Smokeless tobacco: Never Used     Comment: down to 5-6 cigarettes a day   . Alcohol use 0.6 oz/week    1 Glasses of wine per week  . Drug use: No  . Sexual activity: No   Other Topics Concern  . None   Social History Narrative   Patient currently lives with her husband.    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 No bird or hot tub exposure. No mold exposure. No recent travel.    ROS  Objective: Vitals:   10/21/16 1552  BP: 130/80  Pulse: 90  Resp: 17  Temp: 97.4 F (36.3 C)  TempSrc: Oral  SpO2: 98%  Weight: 132 lb (59.9 kg)  Height: '5\' 3"'$  (1.6 m)    Physical Exam  Constitutional: She appears well-developed and well-nourished.  HENT:  Head: Normocephalic and atraumatic.  Eyes: Conjunctivae and EOM are normal.  Neck: Normal range of motion. Neck supple.  Cardiovascular: Normal rate, regular rhythm and normal heart sounds.   Pulmonary/Chest: Effort normal. She has wheezes.  Musculoskeletal:  nontender over lumbar spine and right hip    Assessment and Plan Catherine Kelley was seen today for back pain.  Diagnoses and all orders for this visit:   Bronchiectasis with acute exacerbation ([place])- she is currently on antibiotics Continue course  Lumbar back pain with radiculopathy affecting right lower extremity  -     HYDROcodone-acetaminophen (NORCO/VICODIN) 5-325 MG tablet; Take 0.5-1 tablets by mouth every 6 (six) hours as needed for moderate pain. Reviewed [place] and last refill was in April 2017 for [place] provider another refill especially since pt is having her usual chronic pain and is  out of her medications Continue zanaflex prn    [place]

## 2016-10-24 ENCOUNTER — Other Ambulatory Visit: Payer: Self-pay | Admitting: Physician Assistant

## 2016-10-24 DIAGNOSIS — F418 Other specified anxiety disorders: Secondary | ICD-10-CM

## 2016-11-07 ENCOUNTER — Ambulatory Visit: Payer: Self-pay

## 2016-11-18 ENCOUNTER — Other Ambulatory Visit: Payer: Self-pay | Admitting: Physician Assistant

## 2016-11-18 DIAGNOSIS — F5105 Insomnia due to other mental disorder: Secondary | ICD-10-CM

## 2016-11-18 DIAGNOSIS — J449 Chronic obstructive pulmonary disease, unspecified: Secondary | ICD-10-CM

## 2016-11-18 DIAGNOSIS — F418 Other specified anxiety disorders: Secondary | ICD-10-CM

## 2016-11-22 ENCOUNTER — Ambulatory Visit (INDEPENDENT_AMBULATORY_CARE_PROVIDER_SITE_OTHER): Payer: Medicare Other | Admitting: Physician Assistant

## 2016-11-22 VITALS — BP 124/80 | HR 101 | Temp 97.8°F | Resp 16 | Ht 63.0 in | Wt 124.0 lb

## 2016-11-22 DIAGNOSIS — F5105 Insomnia due to other mental disorder: Secondary | ICD-10-CM | POA: Diagnosis not present

## 2016-11-22 DIAGNOSIS — F418 Other specified anxiety disorders: Secondary | ICD-10-CM | POA: Diagnosis not present

## 2016-11-22 DIAGNOSIS — R300 Dysuria: Secondary | ICD-10-CM

## 2016-11-22 DIAGNOSIS — J209 Acute bronchitis, unspecified: Secondary | ICD-10-CM | POA: Diagnosis not present

## 2016-11-22 LAB — POCT URINALYSIS DIP (MANUAL ENTRY)
BILIRUBIN UA: NEGATIVE
Blood, UA: NEGATIVE
GLUCOSE UA: NEGATIVE
Ketones, POC UA: NEGATIVE
Nitrite, UA: NEGATIVE
Protein Ur, POC: NEGATIVE
SPEC GRAV UA: 1.01
UROBILINOGEN UA: 0.2
pH, UA: 5.5

## 2016-11-22 LAB — POC MICROSCOPIC URINALYSIS (UMFC): MUCUS RE: ABSENT

## 2016-11-22 MED ORDER — DOXYCYCLINE HYCLATE 100 MG PO TABS: 100.0000 mg | ORAL_TABLET | Freq: Two times a day (BID) | ORAL | 0 refills | Status: DC

## 2016-11-22 MED ORDER — ALPRAZOLAM 0.5 MG PO TABS: 0.5000 mg | ORAL_TABLET | Freq: Every day | ORAL | 0 refills | Status: DC

## 2016-11-22 MED ORDER — DIVALPROEX SODIUM ER 500 MG PO TB24: 500.0000 mg | ORAL_TABLET | Freq: Every day | ORAL | 5 refills | Status: DC

## 2016-11-22 MED ORDER — NORTRIPTYLINE HCL 50 MG PO CAPS: 100.0000 mg | ORAL_CAPSULE | Freq: Every evening | ORAL | 5 refills | Status: DC

## 2016-11-22 NOTE — Progress Notes (Signed)
Catherine Kelley  MRN: [id number] DOB: 1951/07/02  Subjective:  Pt presents to clinic for several problems.  She needs her medications refilled.  She is also having some cold symptoms most of which have gone away but she has a residual cough - the cough is slightly productive which is mainly white sputum which is worse in the am and then in the pm when she 1st lays down.  No SOB or wheezing.  She has not had to stop activities that she typically does. She is also having some urinary symptoms with dysuria and discomfort in her vaginal area.  She has been told in the past that she has vaginal atrophy but she has not had treatment in years.  She has been using vaseline to help with the irritation.    Sleep is better with the pamelor at 131m - she started at 263mand she titrated up and has been on the 10071mor at least a month.  She feels like her mood is doing well.  She is still taking her depakote and not having problems with it.  Xanax she uses at night every night to help with her nerves.  She is very nervous to decrease this medication as this is something that she has been on for a long time and she does not want to stop it.  5-6 cigs a day  Review of Systems  Patient Active Problem List   Diagnosis Date Noted  . Tobacco abuse 02/12/2016  . Other fatigue 02/04/2016  . Respiratory failure with hypoxia ([place]2/07/2016  . Malnutrition of moderate degree 11/03/2015  . Pulmonary emphysema ([place] . Community acquired pneumonia   . Pneumonia 11/01/2015  . Essential hypertension 09/27/2015  . Rhabdomyolysis 08/16/2015  . Chronic cough 06/07/2015  . Bronchiectasis with acute exacerbation ([place]4/05/2015  . COPD, moderate ([facility]1/13/2016  . Cigarette smoker 12/13/2014  . COPD GOLD II/ still smoking 10/11/2014  . Lumbar back pain with radiculopathy affecting right lower extremity 09/28/2014  . Hyponatremia 07/09/2014  . Hx of recurrent pneumonia 07/08/2014  . Venous insufficiency  (chronic) (peripheral) 09/29/2012  . Cellulitis and abscess of foot, except toes 09/21/2012  . Lower extremity edema 03/09/2012  . Benign hypertensive heart disease without heart failure 05/09/2011  . Chest pain 05/09/2011  . Dyslipidemia 05/09/2011  . Depression 05/09/2011  . Osteoarthritis 05/09/2011    Current Outpatient Prescriptions on File Prior to Visit  Medication Sig Dispense Refill  . benzonatate (TESSALON) 100 MG capsule TAKE 1 TO 2 CAPSULES BY MOUTH 3 TIMES DAILY AS NEEDED FOR COUGH 60 capsule 2  . guaiFENesin (MUCINEX) 600 MG 12 hr tablet Take 1 tablet (600 mg total) by mouth 2 (two) times daily. 60 tablet 0  . HYDROcodone-acetaminophen (NORCO/VICODIN) 5-325 MG tablet Take 0.5-1 tablets by mouth every 6 (six) hours as needed for moderate pain. 40 tablet 0  . spironolactone (ALDACTONE) 50 MG tablet Take 1 tablet (50 mg total) by mouth daily. 90 tablet 0  . tiZANidine (ZANAFLEX) 4 MG tablet Take 1 tablet (4 mg total) by mouth every 8 (eight) hours as needed for muscle spasms. 45 tablet 2  . albuterol (PROVENTIL) (2.5 MG/3ML) 0.083% nebulizer solution TAKE 3 MLS (2.5 MG TOTAL) BY NEBULIZATION EVERY 6 (SIX) HOURS AS NEEDED FOR SHORTNESS OF BREATH. 150 mL 1   No current facility-administered medications on file prior to visit.     Allergies  Allergen Reactions  . Ceclor [Cefaclor] Hives    Tolerated ceftazidime  December 2016  . Escitalopram Oxalate     Pt does not recall ever taking medication  . Sertraline Hcl     Severe Headache  . Sulfa Drugs Cross Reactors Hives and Rash    Hives and rash    Pt patients past, family and social history were reviewed and updated.   Objective:  BP 124/80 (BP Location: Right Arm, Patient Position: Sitting, Cuff Size: Normal)   Pulse (!) 101   Temp 97.8 F (36.6 C) (Oral)   Resp 16   Ht '5\' 3"'  (1.6 m)   Wt 124 lb (56.2 kg)   SpO2 94%   BMI 21.97 kg/m   Physical Exam  Constitutional: She is oriented to person, place, and time and  well-developed, well-nourished, and in no distress.  HENT:  Head: Normocephalic and atraumatic.  Right Ear: Hearing and external ear normal.  Left Ear: Hearing and external ear normal.  Eyes: Conjunctivae are normal.  Neck: Normal range of motion.  Cardiovascular: Normal rate, regular rhythm and normal heart sounds.   No murmur heard. Pulmonary/Chest: Effort normal and breath sounds normal. She has no wheezes.  Musculoskeletal:  No LE edema  Neurological: She is alert and oriented to person, place, and time. Gait normal.  Skin: Skin is warm and dry.  Psychiatric: Mood, memory, affect and judgment normal.  Vitals reviewed.   Results for orders placed or performed in visit on 11/22/16  Valproic acid level  Result Value Ref Range   Valproic Acid Lvl 38 (L) 50 - 100 ug/mL  CMP14+EGFR  Result Value Ref Range   Glucose 87 65 - 99 mg/dL   BUN 6 (L) 8 - 27 mg/dL   Creatinine, Ser 0.64 0.57 - 1.00 mg/dL   GFR calc non Af Amer 93 >59 mL/min/1.73   GFR calc Af Amer 108 >59 mL/min/1.73   BUN/Creatinine Ratio 9 (L) 12 - 28   Sodium 138 134 - 144 mmol/L   Potassium 3.9 3.5 - 5.2 mmol/L   Chloride 97 96 - 106 mmol/L   CO2 24 18 - 29 mmol/L   Calcium 9.5 8.7 - 10.3 mg/dL   Total Protein 7.8 6.0 - 8.5 g/dL   Albumin 4.2 3.6 - 4.8 g/dL   Globulin, Total 3.6 1.5 - 4.5 g/dL   Albumin/Globulin Ratio 1.2 1.2 - 2.2   Bilirubin Total 0.2 0.0 - 1.2 mg/dL   Alkaline Phosphatase 179 (H) 39 - 117 IU/L   AST 27 0 - 40 IU/L   ALT 27 0 - 32 IU/L  POCT Microscopic Urinalysis ([facility])  Result Value Ref Range   WBC,UR,HPF,POC None None WBC/hpf   RBC,UR,HPF,POC None None RBC/hpf   Bacteria None None, Too numerous to count   Mucus Absent Absent   Epithelial Cells, UR Per Microscopy Few (A) None, Too numerous to count cells/hpf  POCT urinalysis dipstick  Result Value Ref Range   Color, UA yellow yellow   Clarity, UA clear clear   Glucose, UA negative negative   Bilirubin, UA negative negative    Ketones, POC UA negative negative   Spec Grav, UA 1.010    Blood, UA negative negative   pH, UA 5.5    Protein Ur, POC negative negative   Urobilinogen, UA 0.2    Nitrite, UA Negative Negative   Leukocytes, UA Trace (A) Negative    Assessment and Plan :  Dysuria - Plan: POCT Microscopic Urinalysis ([facility]), POCT urinalysis dipstick - no UTI seen on urine - likely related to vaginal  dryness - she will start using a water based lubricant to help with irritation  Insomnia secondary to depression with anxiety - Plan: ALPRAZolam (XANAX) 0.5 MG tablet - continue for now - pt will try and decrease the dose - she has been on a long time and the goal is not to stop this medication but to decrease to the lowest effective dose to decrease her changes of problems as she ages.  Anxiety associated with depression - Plan: divalproex (DEPAKOTE ER) 500 MG 24 hr tablet, Valproic acid level, CMP14+EGFR, nortriptyline (PAMELOR) 50 MG capsule - continue current medication -   Acute bronchitis, unspecified organism - Plan: doxycycline (VIBRA-TABS) 100 MG tablet - cover due to past history of lung disease and the length of time she has had problems with productive mucus.  Windell Hummingbird PA-C  [facility] 11/27/2016 3:42 PM

## 2016-11-22 NOTE — Patient Instructions (Addendum)
Trial of decrease xanax at night  Take all of antibiotic to help with likely bronchitis  I refilled al your medciations    IF you received an x-ray today, you will receive an invoice from [facility]. Please contact [facility] at [phone] with questions or concerns regarding your invoice.   IF you received labwork today, you will receive an invoice from [place]. Please contact [facility] at [phone] with questions or concerns regarding your invoice.   Our billing staff will not be able to assist you with questions regarding bills from these companies.  You will be contacted with the lab results as soon as they are available. The fastest way to get your results is to activate your My Chart account. Instructions are located on the last page of this paperwork. If you have not heard from [name] regarding the results in 2 weeks, please contact this office.

## 2016-11-23 LAB — CMP14+EGFR
A/G RATIO: 1.2 (ref 1.2–2.2)
ALT: 27 IU/L (ref 0–32)
AST: 27 IU/L (ref 0–40)
Albumin: 4.2 g/dL (ref 3.6–4.8)
Alkaline Phosphatase: 179 IU/L — ABNORMAL HIGH (ref 39–117)
BUN/Creatinine Ratio: 9 — ABNORMAL LOW (ref 12–28)
BUN: 6 mg/dL — ABNORMAL LOW (ref 8–27)
Bilirubin Total: 0.2 mg/dL (ref 0.0–1.2)
CO2: 24 mmol/L (ref 18–29)
Calcium: 9.5 mg/dL (ref 8.7–10.3)
Chloride: 97 mmol/L (ref 96–106)
Creatinine, Ser: 0.64 mg/dL (ref 0.57–1.00)
GFR calc Af Amer: 108 mL/min/{1.73_m2} (ref >59)
GFR calc non Af Amer: 93 mL/min/{1.73_m2} (ref >59)
Globulin, Total: 3.6 g/dL (ref 1.5–4.5)
Glucose: 87 mg/dL (ref 65–99)
POTASSIUM: 3.9 mmol/L (ref 3.5–5.2)
Sodium: 138 mmol/L (ref 134–144)
Total Protein: 7.8 g/dL (ref 6.0–8.5)

## 2016-11-23 LAB — VALPROIC ACID LEVEL: VALPROIC ACID LVL: 38 ug/mL — AB (ref 50–100)

## 2016-11-25 NOTE — Telephone Encounter (Signed)
05/2016 last ov and labs

## 2016-11-28 ENCOUNTER — Other Ambulatory Visit: Payer: Self-pay | Admitting: Family Medicine

## 2016-11-28 DIAGNOSIS — R600 Localized edema: Secondary | ICD-10-CM

## 2016-11-30 NOTE — Telephone Encounter (Signed)
Surescripts req for refill spironolactone  Please advise - labs on 12/23 dec BUN.

## 2016-12-11 ENCOUNTER — Other Ambulatory Visit: Payer: Self-pay | Admitting: Physician Assistant

## 2016-12-11 DIAGNOSIS — J209 Acute bronchitis, unspecified: Secondary | ICD-10-CM

## 2016-12-25 ENCOUNTER — Other Ambulatory Visit: Payer: Self-pay | Admitting: Physician Assistant

## 2016-12-25 DIAGNOSIS — F418 Other specified anxiety disorders: Secondary | ICD-10-CM

## 2016-12-25 DIAGNOSIS — F5105 Insomnia due to other mental disorder: Principal | ICD-10-CM

## 2016-12-26 NOTE — Telephone Encounter (Signed)
Done

## 2017-01-14 ENCOUNTER — Other Ambulatory Visit: Payer: Self-pay | Admitting: Physician Assistant

## 2017-01-14 DIAGNOSIS — M5416 Radiculopathy, lumbar region: Secondary | ICD-10-CM

## 2017-01-15 NOTE — Telephone Encounter (Signed)
05/2016 last ov and refill with Catherine Kelley

## 2017-01-26 ENCOUNTER — Other Ambulatory Visit: Payer: Self-pay | Admitting: Physician Assistant

## 2017-01-26 DIAGNOSIS — F418 Other specified anxiety disorders: Secondary | ICD-10-CM

## 2017-01-26 DIAGNOSIS — F5105 Insomnia due to other mental disorder: Principal | ICD-10-CM

## 2017-01-26 NOTE — Telephone Encounter (Signed)
Done

## 2017-01-26 NOTE — Telephone Encounter (Signed)
[facility] 

## 2017-02-07 ENCOUNTER — Ambulatory Visit (INDEPENDENT_AMBULATORY_CARE_PROVIDER_SITE_OTHER): Payer: Medicare Other | Admitting: Family Medicine

## 2017-02-07 VITALS — BP 120/82 | HR 88 | Temp 96.7°F | Resp 16 | Ht 63.0 in | Wt 138.6 lb

## 2017-02-07 DIAGNOSIS — I10 Essential (primary) hypertension: Secondary | ICD-10-CM

## 2017-02-07 DIAGNOSIS — J449 Chronic obstructive pulmonary disease, unspecified: Secondary | ICD-10-CM | POA: Diagnosis not present

## 2017-02-07 DIAGNOSIS — L309 Dermatitis, unspecified: Secondary | ICD-10-CM

## 2017-02-07 DIAGNOSIS — J069 Acute upper respiratory infection, unspecified: Secondary | ICD-10-CM | POA: Diagnosis not present

## 2017-02-07 MED ORDER — TRIAMCINOLONE ACETONIDE 0.1 % EX CREA: 1.0000 "application " | TOPICAL_CREAM | Freq: Two times a day (BID) | CUTANEOUS | 0 refills | Status: DC

## 2017-02-07 NOTE — Patient Instructions (Addendum)
Hand Dermatitis Hand dermatitis is a skin condition that causes small, itchy, raised dots or fluid-filled blisters to form over the palms of the hands. This condition may also be called hand eczema. What are the causes? The cause of this condition is not known. What increases the risk? This condition is more likely to develop in people who have a history of allergies, such as:  Hay fever.  Allergic asthma.  An allergy to latex. Chemical exposure, injuries, and environmental irritants can make hand dermatitis worse. Washing your hands too often can remove natural oils, which can dry out the skin and contribute to outbreaks of this condition. What are the signs or symptoms? The most common symptom of this condition is intense itchiness. Cracks or grooves (fissures) on the fingers can also develop. Affected areas can be painful, especially areas where large blisters have formed. How is this diagnosed? This condition is diagnosed with a medical history and physical exam. How is this treated? This condition is treated with medicines, including:  Steroid creams and ointments.  Oral steroid medicines.  Antibiotic medicines. These are prescribed if you have an infection.  Antihistamine medicines. These help to reduce itchiness. Follow these instructions at home:  Take or apply over-the-counter and prescription medicines only as told by your health care provider.  If you were prescribed an antibiotic medicine, use it as told by your health care provider. Do not stop using the antibiotic even if you start to feel better.  Avoid washing your hands more often than necessary.  Avoid using harsh chemicals on your hands.  Wear protective gloves when you handle products that can irritate your skin.  Keep all follow-up visits as told by your health care provider. This is important. Contact a health care provider if:  Your rash does not improve during the first week of treatment.  Your rash  is red or tender.  Your rash has pus coming from it.  Your rash spreads. This information is not intended to replace advice given to you by your health care provider. Make sure you discuss any questions you have with your health care provider. Document Released: 11/17/2005 Document Revised: 04/24/2016 Document Reviewed: 06/01/2015 Elsevier Interactive Patient Education  2017 [employer].

## 2017-02-07 NOTE — Progress Notes (Signed)
Chief Complaint  Patient presents with  . Hand Problem    hit hand wednesday and said arm feels hot  . Cough    congestion, yellow mucus x 1 week, headache    HPI   Pt reports that she has been having itching and an infected wound in her right arm She reports that she has been having difficulty with the arm from the back of the hand to the wrist to the forearm She reports that she also has redness She suffers from chronic skin bruising She put a bandage on it and then developed itching but now the area is red  She also has cough with yellow mucus She reports that she has headache She reports that she also has some bumps under her tongue She has a history of COPD and is still smoking  Hypertension: Patient here for follow-up of elevated blood pressure.  She reports that she has been taking her medications.  She still smokes. She denies chest pains, palpitations, or lower extremity edema. BP Readings from Last 3 Encounters:  02/07/17 120/82  11/22/16 124/80  10/21/16 130/80     Past Medical History:  Diagnosis Date  . Allergy   . Anxiety   . Asthma   . Chronic bronchitis ([place])    "get it alot; maybe not q yr" (07/06/2014)  . COPD (chronic obstructive pulmonary disease) ([place])   . Depression    psychiatrist Dr. Toy Care  . Diastolic dysfunction   . Emphysematous COPD ([place])    changes in right base along with patchy areas on last x-ray  . GERD (gastroesophageal reflux disease)   . HCAP (healthcare-associated pneumonia) 02/12/2016  . Heart murmur   . History of cardiovascular stress test 08/15/2004   EF of 70% -- Normal stress cardiolite.  There is no evidence of ischemia and there is normal LV function. -- [name]. MD  . History of echocardiogram 12/24/2006   Est. EF of 34-19% NORMAL LV SYSTOLIC FUNCTION WITH IMPAIRED RELAXATION -- MILD AORTIC SCLEROSIS -- NORMAL PALONARY ARTERY PRESSURE -- NO OLD ECHOS FOR COMPARISON -- [name], MD  . Hypertension    essential hypertension  . Necrotic pneumonia ([place])    recurrent/notes 02/12/2016  . Pneumonia    "several times since May 2015" (07/06/2014)  . Tobacco abuse    smoking about 1/2 pk of cigarettes a day    Current Outpatient Prescriptions  Medication Sig Dispense Refill  . albuterol (PROVENTIL) (2.5 MG/3ML) 0.083% nebulizer solution TAKE 3 MLS (2.5 MG TOTAL) BY NEBULIZATION EVERY 6 (SIX) HOURS AS NEEDED FOR SHORTNESS OF BREATH. 150 mL 1  . ALPRAZolam (XANAX) 0.5 MG tablet TAKE 1 TABLET BY MOUTH AT BEDTIME 30 tablet 0  . benzonatate (TESSALON) 100 MG capsule TAKE 1 TO 2 CAPSULES BY MOUTH 3 TIMES DAILY AS NEEDED FOR COUGH (Patient not taking: Reported on 02/07/2017) 60 capsule 2  . divalproex (DEPAKOTE ER) 500 MG 24 hr tablet Take 1 tablet (500 mg total) by mouth at bedtime. 30 tablet 5  . doxycycline (VIBRA-TABS) 100 MG tablet Take 1 tablet (100 mg total) by mouth 2 (two) times daily. (Patient not taking: Reported on 02/07/2017) 20 tablet 0  . guaiFENesin (MUCINEX) 600 MG 12 hr tablet Take 1 tablet (600 mg total) by mouth 2 (two) times daily. 60 tablet 0  . HYDROcodone-acetaminophen (NORCO/VICODIN) 5-325 MG tablet Take 0.5-1 tablets by mouth every 6 (six) hours as needed for moderate pain. 40 tablet 0  . nortriptyline (PAMELOR) 50 MG  capsule Take 2 capsules (100 mg total) by mouth Nightly. 60 capsule 5  . spironolactone (ALDACTONE) 50 MG tablet TAKE 1 TABLET EVERY DAY 90 tablet 1  . tiZANidine (ZANAFLEX) 4 MG tablet TAKE 1 TABLET BY MOUTH EVERY 8 HOURS AS NEEDED FOR MUSCLE SPASMS 45 tablet 2  . triamcinolone cream (KENALOG) 0.1 % Apply 1 application topically 2 (two) times daily. 30 g 0   No current facility-administered medications for this visit.     Allergies:  Allergies  Allergen Reactions  . Ceclor [Cefaclor] Hives    Tolerated ceftazidime December 2016  . Escitalopram Oxalate     Pt does not recall ever taking medication  . Sertraline Hcl     Severe Headache  . Sulfa Drugs Cross  Reactors Hives and Rash    Hives and rash    Past Surgical History:  Procedure Laterality Date  . DILATION AND CURETTAGE OF UTERUS    . TUBAL LIGATION  1986    Social History   Social History  . Marital status: Married    Spouse name: N/A  . Number of children: N/A  . Years of education: N/A   Occupational History  . 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 Smoking status: Current Every Day Smoker    Packs/day: 1.00    Years: 44.00    Types: Cigarettes  . Smokeless tobacco: Never Used     Comment: down to 5-6 cigarettes a day   . Alcohol use 0.6 oz/week    1 Glasses of wine per week  . Drug use: No  . Sexual activity: No   Other Topics Concern  . None   Social History Narrative   Patient currently lives with her husband.    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 No bird or hot tub exposure. No mold exposure. No recent travel.    ROS See hpi  Objective: Vitals:   02/07/17 1121  BP: 120/82  Pulse: 88  Resp: 16  Temp: (!) 96.7 F (35.9 C)  TempSrc: Axillary  SpO2: 94%  Weight: 138 lb 9.6 oz (62.9 kg)  Height: '5\' 3"'$  (1.6 m)   Wt Readings from Last 3 Encounters:  02/07/17 138 lb 9.6 oz (62.9 kg)  11/22/16 124 lb (56.2 kg)  10/21/16 132 lb (59.9 kg)    Physical Exam  Constitutional: She is oriented to person, place, and time. She appears well-developed and well-nourished.  HENT:  Head: Normocephalic and atraumatic.  Eyes: Conjunctivae and EOM are normal.  Cardiovascular: Normal rate, regular rhythm and normal heart sounds.   No murmur heard. Pulmonary/Chest: Effort normal. No respiratory distress. She has wheezes.  Neurological: She is alert and oriented to person, place, and time.  Skin: Skin is warm. Capillary refill takes less than 2 seconds.  Psychiatric: She has a normal mood and affect. Her behavior is normal. Judgment and thought content normal.      Assessment and Plan Catherine Kelley was seen  today for hand problem and cough.  Diagnoses and all orders for this visit:  Acute URI-  Discussed continue supportive care  Dermatitis- avoid washing dishes in hot water as this can lead to thermal burns She should not put any topical antibiotics for now  COPD GOLD II/ still smoking- lungs clear except for the chronic wheezing  Essential hypertension- bp in good range  Other orders -     triamcinolone cream (KENALOG)  0.1 %; Apply 1 application topically 2 (two) times daily.     [place]

## 2017-02-24 ENCOUNTER — Other Ambulatory Visit: Payer: Self-pay | Admitting: Physician Assistant

## 2017-02-24 DIAGNOSIS — F418 Other specified anxiety disorders: Secondary | ICD-10-CM

## 2017-02-24 DIAGNOSIS — F5105 Insomnia due to other mental disorder: Principal | ICD-10-CM

## 2017-02-25 NOTE — Telephone Encounter (Signed)
Done

## 2017-02-26 NOTE — Telephone Encounter (Signed)
[facility] 

## 2017-02-26 NOTE — Telephone Encounter (Signed)
Left message that script is ready to pick up.

## 2017-03-10 ENCOUNTER — Other Ambulatory Visit: Payer: Self-pay | Admitting: Physician Assistant

## 2017-03-10 DIAGNOSIS — J449 Chronic obstructive pulmonary disease, unspecified: Secondary | ICD-10-CM

## 2017-03-26 ENCOUNTER — Other Ambulatory Visit: Payer: Self-pay | Admitting: Physician Assistant

## 2017-03-26 DIAGNOSIS — F5105 Insomnia due to other mental disorder: Secondary | ICD-10-CM

## 2017-03-26 DIAGNOSIS — R05 Cough: Secondary | ICD-10-CM

## 2017-03-26 DIAGNOSIS — R059 Cough, unspecified: Secondary | ICD-10-CM

## 2017-03-26 DIAGNOSIS — F418 Other specified anxiety disorders: Secondary | ICD-10-CM

## 2017-03-29 ENCOUNTER — Other Ambulatory Visit: Payer: Self-pay | Admitting: Physician Assistant

## 2017-03-29 DIAGNOSIS — F418 Other specified anxiety disorders: Secondary | ICD-10-CM

## 2017-03-29 DIAGNOSIS — F5105 Insomnia due to other mental disorder: Principal | ICD-10-CM

## 2017-03-30 ENCOUNTER — Telehealth: Payer: Self-pay | Admitting: Physician Assistant

## 2017-03-30 NOTE — Telephone Encounter (Signed)
Pt calling about refill on xanax. Script is ready, just waiting for [employer]. Pt informed that it will be signed and faxed asap tomorrow morning as she is currently out.

## 2017-03-31 NOTE — Telephone Encounter (Signed)
[facility]

## 2017-04-03 ENCOUNTER — Other Ambulatory Visit: Payer: Self-pay | Admitting: Physician Assistant

## 2017-04-03 DIAGNOSIS — Z1231 Encounter for screening mammogram for malignant neoplasm of breast: Secondary | ICD-10-CM

## 2017-04-09 ENCOUNTER — Ambulatory Visit (INDEPENDENT_AMBULATORY_CARE_PROVIDER_SITE_OTHER): Payer: Medicare Other

## 2017-04-09 ENCOUNTER — Ambulatory Visit: Payer: Self-pay | Admitting: Family Medicine

## 2017-04-09 ENCOUNTER — Encounter: Payer: Self-pay | Admitting: Emergency Medicine

## 2017-04-09 ENCOUNTER — Ambulatory Visit (INDEPENDENT_AMBULATORY_CARE_PROVIDER_SITE_OTHER): Payer: Medicare Other | Admitting: Emergency Medicine

## 2017-04-09 VITALS — BP 138/78 | HR 106 | Temp 97.6°F | Resp 16 | Ht 63.0 in | Wt 131.0 lb

## 2017-04-09 DIAGNOSIS — R05 Cough: Secondary | ICD-10-CM

## 2017-04-09 DIAGNOSIS — J209 Acute bronchitis, unspecified: Secondary | ICD-10-CM

## 2017-04-09 DIAGNOSIS — J471 Bronchiectasis with (acute) exacerbation: Secondary | ICD-10-CM | POA: Diagnosis not present

## 2017-04-09 DIAGNOSIS — R059 Cough, unspecified: Secondary | ICD-10-CM

## 2017-04-09 DIAGNOSIS — J449 Chronic obstructive pulmonary disease, unspecified: Secondary | ICD-10-CM | POA: Diagnosis not present

## 2017-04-09 MED ORDER — MINOCYCLINE HCL 100 MG PO CAPS: 100.0000 mg | ORAL_CAPSULE | Freq: Two times a day (BID) | ORAL | 0 refills | Status: AC

## 2017-04-09 NOTE — Progress Notes (Signed)
Catherine Kelley 66 y.o.   Chief Complaint  Patient presents with  . Cough    X 2 weeks- with yellow mucus  . Nausea    X 1 week    HISTORY OF PRESENT ILLNESS: This is a 66 y.o. female complaining of productive cough x 2 weeks; has h/o pneumonia that turned septic 1/17. Has been nauseous and vomited once 3 days ago.  Cough  This is a recurrent problem. Pertinent negatives include no chest pain, chills, ear pain, eye redness, fever, headaches, hemoptysis, myalgias, rash, sore throat, shortness of breath, weight loss or wheezing.     Prior to Admission medications   Medication Sig Start Date End Date Taking? Authorizing Provider  albuterol (PROVENTIL) (2.5 MG/3ML) 0.083% nebulizer solution TAKE 3 MLS (2.5 MG TOTAL) BY NEBULIZATION EVERY 6 (SIX) HOURS AS NEEDED FOR SHORTNESS OF BREATH. 03/11/17  Yes English, Stephanie D, PA  ALPRAZolam Duanne Moron) 0.5 MG tablet TAKE 1 TABLET BY MOUTH AT BEDTIME 03/29/17  Yes Weber, Sarah L, PA-C  benzonatate (TESSALON) 100 MG capsule TAKE 1 TO 2 CAPSULES BY MOUTH 3 TIMES A DAY AS NEEDED FOR COUGH 03/29/17  Yes Weber, Sarah L, PA-C  divalproex (DEPAKOTE ER) 500 MG 24 hr tablet Take 1 tablet (500 mg total) by mouth at bedtime. 11/22/16  Yes Weber, Damaris Hippo, PA-C  doxycycline (VIBRA-TABS) 100 MG tablet Take 1 tablet (100 mg total) by mouth 2 (two) times daily. 11/22/16  Yes Weber, Damaris Hippo, PA-C  guaiFENesin (MUCINEX) 600 MG 12 hr tablet Take 1 tablet (600 mg total) by mouth 2 (two) times daily. 11/08/15  Yes Reyne Dumas, MD  HYDROcodone-acetaminophen (NORCO/VICODIN) 5-325 MG tablet Take 0.5-1 tablets by mouth every 6 (six) hours as needed for moderate pain. 10/21/16  Yes Forrest Moron, MD  nortriptyline (PAMELOR) 50 MG capsule Take 2 capsules (100 mg total) by mouth Nightly. 11/22/16  Yes Weber, Sarah L, PA-C  spironolactone (ALDACTONE) 50 MG tablet TAKE 1 TABLET EVERY DAY 12/02/16  Yes Wardell Honour, MD  tiZANidine (ZANAFLEX) 4 MG tablet TAKE 1 TABLET BY MOUTH EVERY  8 HOURS AS NEEDED FOR MUSCLE SPASMS 01/15/17  Yes Weber, Sarah L, PA-C  triamcinolone cream (KENALOG) 0.1 % Apply 1 application topically 2 (two) times daily. 02/07/17  Yes Forrest Moron, MD    Allergies  Allergen Reactions  . Ceclor [Cefaclor] Hives    Tolerated ceftazidime December 2016  . Escitalopram Oxalate     Pt does not recall ever taking medication  . Sertraline Hcl     Severe Headache  . Sulfa Drugs Cross Reactors Hives and Rash    Hives and rash    Patient Active Problem List   Diagnosis Date Noted  . Tobacco abuse 02/12/2016  . Other fatigue 02/04/2016  . Respiratory failure with hypoxia ([place]) 01/09/2016  . Malnutrition of moderate degree 11/03/2015  . Pulmonary emphysema ([place])   . Community acquired pneumonia   . Pneumonia 11/01/2015  . Essential hypertension 09/27/2015  . Rhabdomyolysis 08/16/2015  . Chronic cough 06/07/2015  . Bronchiectasis with acute exacerbation ([place]) 03/07/2015  . COPD, moderate ([place]) 12/13/2014  . Cigarette smoker 12/13/2014  . COPD GOLD II/ still smoking 10/11/2014  . Lumbar back pain with radiculopathy affecting right lower extremity 09/28/2014  . Hyponatremia 07/09/2014  . Hx of recurrent pneumonia 07/08/2014  . Venous insufficiency (chronic) (peripheral) 09/29/2012  . Cellulitis and abscess of foot, except toes 09/21/2012  . Lower extremity edema 03/09/2012  . Benign hypertensive heart disease  without heart failure 05/09/2011  . Chest pain 05/09/2011  . Dyslipidemia 05/09/2011  . Depression 05/09/2011  . Osteoarthritis 05/09/2011    Past Medical History:  Diagnosis Date  . Allergy   . Anxiety   . Asthma   . Chronic bronchitis ([place])    "get it alot; maybe not q yr" (07/06/2014)  . COPD (chronic obstructive pulmonary disease) ([place])   . Depression    psychiatrist Dr. Toy Care  . Diastolic dysfunction   . Emphysematous COPD ([place])    changes in right base along with patchy areas on last x-ray  . GERD (gastroesophageal reflux  disease)   . HCAP (healthcare-associated pneumonia) 02/12/2016  . Heart murmur   . History of cardiovascular stress test 08/15/2004   EF of 70% -- Normal stress cardiolite.  There is no evidence of ischemia and there is normal LV function. -- [name]. MD  . History of echocardiogram 12/24/2006   Est. EF of 03-50% NORMAL LV SYSTOLIC FUNCTION WITH IMPAIRED RELAXATION -- MILD AORTIC SCLEROSIS -- NORMAL PALONARY ARTERY PRESSURE -- NO OLD ECHOS FOR COMPARISON -- [name], MD  . Hypertension    essential hypertension  . Necrotic pneumonia ([place])    recurrent/notes 02/12/2016  . Pneumonia    "several times since May 2015" (07/06/2014)  . Tobacco abuse    smoking about 1/2 pk of cigarettes a day    Past Surgical History:  Procedure Laterality Date  . DILATION AND CURETTAGE OF UTERUS    . TUBAL LIGATION  1986    Social History   Social History  . Marital status: Married    Spouse name: N/A  . Number of children: N/A  . Years of education: N/A   Occupational History  . 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 Smoking status: Current Every Day Smoker    Packs/day: 1.00    Years: 44.00    Types: Cigarettes  . Smokeless tobacco: Never Used     Comment: down to 5-6 cigarettes a day   . Alcohol use 0.6 oz/week    1 Glasses of wine per week  . Drug use: No  . Sexual activity: No   Other Topics Concern  . Not on file   Social History Narrative   Patient currently lives with her husband.    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 No bird or hot tub exposure. No mold exposure. No recent travel.    Family History  Problem Relation Age of Onset  . Stroke Mother   . Heart disease Father   . Hyperlipidemia Father   . Hypertension Father   . Breast cancer Maternal Aunt   . Asthma Maternal Grandmother      Review of Systems  Constitutional: Negative.  Negative for chills, fever and weight loss.  HENT: Negative.   Negative for ear pain and sore throat.   Eyes: Negative.  Negative for discharge and redness.  Respiratory: Positive for cough and sputum production. Negative for hemoptysis, shortness of breath and wheezing.   Cardiovascular: Negative for chest pain, palpitations and claudication.  Gastrointestinal: Negative for abdominal pain, diarrhea, nausea and vomiting.  Musculoskeletal: Negative for myalgias and neck pain.  Skin: Negative.  Negative for rash.  Neurological: Negative for dizziness and headaches.  Endo/Heme/Allergies: Negative.   All other systems reviewed and are negative.    Physical Exam  Constitutional: She is oriented to person, place,  and time. She appears well-developed and well-nourished.  HENT:  Head: Normocephalic and atraumatic.  Mouth/Throat: Oropharynx is clear and moist. No oropharyngeal exudate.  Eyes: Conjunctivae and EOM are normal. Pupils are equal, round, and reactive to light.  Neck: Normal range of motion. Neck supple. No JVD present. No thyromegaly present.  Cardiovascular: Normal rate, regular rhythm and normal heart sounds.   Pulmonary/Chest: Effort normal and breath sounds normal.  Abdominal: Soft. Bowel sounds are normal. She exhibits no distension. There is no tenderness.  Musculoskeletal: Normal range of motion.  Lymphadenopathy:    She has no cervical adenopathy.  Neurological: She is alert and oriented to person, place, and time. No sensory deficit. She exhibits normal muscle tone.  Skin: Skin is warm and dry. Capillary refill takes less than 2 seconds. No rash noted.  Psychiatric: She has a normal mood and affect. Her behavior is normal.  Vitals reviewed.  CXR reviewed. No pneumonia.  ASSESSMENT & PLAN: [place] was seen today for cough and nausea.  Diagnoses and all orders for this visit:  Acute bronchitis, unspecified organism -     minocycline (MINOCIN,DYNACIN) 100 MG capsule; Take 1 capsule (100 mg total) by mouth 2 (two) times  daily.  Cough -     DG Chest 2 View; Future  Bronchiectasis with acute exacerbation ([place])  COPD GOLD II/ still smoking    Patient Instructions       IF you received an x-ray today, you will receive an invoice from [facility]. Please contact [facility] at [phone] with questions or concerns regarding your invoice.   IF you received labwork today, you will receive an invoice from [facility]. Please contact [facility] at [phone] with questions or concerns regarding your invoice.   Our billing staff will not be able to assist you with questions regarding bills from these companies.  You will be contacted with the lab results as soon as they are available. The fastest way to get your results is to activate your My Chart account. Instructions are located on the last page of this paperwork. If you have not heard from [name] regarding the results in 2 weeks, please contact this office.      Acute Bronchitis, Adult Acute bronchitis is when air tubes (bronchi) in the lungs suddenly get swollen. The condition can make it hard to breathe. It can also cause these symptoms:  A cough.  Coughing up clear, yellow, or green mucus.  Wheezing.  Chest congestion.  Shortness of breath.  A fever.  Body aches.  Chills.  A sore throat. Follow these instructions at home: Medicines   Take over-the-counter and prescription medicines only as told by your doctor.  If you were prescribed an antibiotic medicine, take it as told by your doctor. Do not stop taking the antibiotic even if you start to feel better. General instructions   Rest.  Drink enough fluids to keep your pee (urine) clear or pale yellow.  Avoid smoking and secondhand smoke. If you smoke and you need help quitting, ask your doctor. Quitting will help your lungs heal faster.  Use an inhaler, cool mist vaporizer, or humidifier as told by your doctor.  Keep all follow-up visits as told by your  doctor. This is important. How is this prevented? To lower your risk of getting this condition again:  Wash your hands often with soap and water. If you cannot use soap and water, use hand sanitizer.  Avoid contact with people who have cold symptoms.  Try not  to touch your hands to your mouth, nose, or eyes.  Make sure to get the flu shot every year. Contact a doctor if:  Your symptoms do not get better in 2 weeks. Get help right away if:  You cough up blood.  You have chest pain.  You have very bad shortness of breath.  You become dehydrated.  You faint (pass out) or keep feeling like you are going to pass out.  You keep throwing up (vomiting).  You have a very bad headache.  Your fever or chills gets worse. This information is not intended to replace advice given to you by your health care provider. Make sure you discuss any questions you have with your health care provider. Document Released: 05/05/2008 Document Revised: 06/25/2016 Document Reviewed: 05/07/2016 Elsevier Interactive Patient Education  2017 [employer].       Agustina Caroli, MD [facility]

## 2017-04-09 NOTE — Patient Instructions (Addendum)
     IF you received an x-ray today, you will receive an invoice from [facility]. Please contact [facility] at [phone] with questions or concerns regarding your invoice.   IF you received labwork today, you will receive an invoice from [facility]. Please contact [facility] at [phone] with questions or concerns regarding your invoice.   Our billing staff will not be able to assist you with questions regarding bills from these companies.  You will be contacted with the lab results as soon as they are available. The fastest way to get your results is to activate your My Chart account. Instructions are located on the last page of this paperwork. If you have not heard from us regarding the results in 2 weeks, please contact this office.      Acute Bronchitis, Adult Acute bronchitis is when air tubes (bronchi) in the lungs suddenly get swollen. The condition can make it hard to breathe. It can also cause these symptoms:  A cough.  Coughing up clear, yellow, or green mucus.  Wheezing.  Chest congestion.  Shortness of breath.  A fever.  Body aches.  Chills.  A sore throat.  Follow these instructions at home: Medicines  Take over-the-counter and prescription medicines only as told by your doctor.  If you were prescribed an antibiotic medicine, take it as told by your doctor. Do not stop taking the antibiotic even if you start to feel better. General instructions  Rest.  Drink enough fluids to keep your pee (urine) clear or pale yellow.  Avoid smoking and secondhand smoke. If you smoke and you need help quitting, ask your doctor. Quitting will help your lungs heal faster.  Use an inhaler, cool mist vaporizer, or humidifier as told by your doctor.  Keep all follow-up visits as told by your doctor. This is important. How is this prevented? To lower your risk of getting this condition again:  Wash your hands often with soap and water. If you cannot  use soap and water, use hand sanitizer.  Avoid contact with people who have cold symptoms.  Try not to touch your hands to your mouth, nose, or eyes.  Make sure to get the flu shot every year.  Contact a doctor if:  Your symptoms do not get better in 2 weeks. Get help right away if:  You cough up blood.  You have chest pain.  You have very bad shortness of breath.  You become dehydrated.  You faint (pass out) or keep feeling like you are going to pass out.  You keep throwing up (vomiting).  You have a very bad headache.  Your fever or chills gets worse. This information is not intended to replace advice given to you by your health care provider. Make sure you discuss any questions you have with your health care provider. Document Released: 05/05/2008 Document Revised: 06/25/2016 Document Reviewed: 05/07/2016 Elsevier Interactive Patient Education  2017 [employer] Inc.  

## 2017-04-10 ENCOUNTER — Telehealth: Payer: Self-pay | Admitting: Family Medicine

## 2017-04-10 NOTE — Telephone Encounter (Signed)
Any suggestions w/o an eval?

## 2017-04-10 NOTE — Telephone Encounter (Signed)
SAGARDIA PATIENT HAS FUNGUS ON NAIL AND WANT TO KNOW WHAT KIND OF CREAM SHE CAN USE I ADVISED HER TO COME IN SO DOCTOR CAN LOOK AT IT SHE STATES THAT SHE CANT AFFORD TO COME INTO OFFICE AGAIN

## 2017-04-11 NOTE — Telephone Encounter (Signed)
Lotrimin OTC but she may benefit from seeing a Dermatologist. Otherwise needs OV.

## 2017-04-13 NOTE — Telephone Encounter (Signed)
Pt advised.

## 2017-04-21 ENCOUNTER — Ambulatory Visit
Admission: RE | Admit: 2017-04-21 | Discharge: 2017-04-21 | Disposition: A | Discharge status: Home / Self Care | Payer: Medicare Other | Source: Ambulatory Visit | Attending: Physician Assistant | Admitting: Physician Assistant

## 2017-04-21 DIAGNOSIS — Z1231 Encounter for screening mammogram for malignant neoplasm of breast: Secondary | ICD-10-CM | POA: Diagnosis not present

## 2017-04-25 ENCOUNTER — Other Ambulatory Visit: Payer: Self-pay | Admitting: Physician Assistant

## 2017-04-25 ENCOUNTER — Ambulatory Visit (INDEPENDENT_AMBULATORY_CARE_PROVIDER_SITE_OTHER): Payer: Medicare Other | Admitting: Family Medicine

## 2017-04-25 ENCOUNTER — Encounter: Payer: Self-pay | Admitting: Family Medicine

## 2017-04-25 VITALS — BP 144/98 | HR 103 | Temp 97.9°F | Resp 16 | Ht 62.0 in | Wt 128.8 lb

## 2017-04-25 DIAGNOSIS — J471 Bronchiectasis with (acute) exacerbation: Secondary | ICD-10-CM | POA: Diagnosis not present

## 2017-04-25 DIAGNOSIS — F418 Other specified anxiety disorders: Secondary | ICD-10-CM

## 2017-04-25 DIAGNOSIS — I10 Essential (primary) hypertension: Secondary | ICD-10-CM | POA: Diagnosis not present

## 2017-04-25 DIAGNOSIS — J449 Chronic obstructive pulmonary disease, unspecified: Secondary | ICD-10-CM | POA: Diagnosis not present

## 2017-04-25 DIAGNOSIS — F5105 Insomnia due to other mental disorder: Principal | ICD-10-CM

## 2017-04-25 MED ORDER — PREDNISONE 20 MG PO TABS: 40.0000 mg | ORAL_TABLET | Freq: Every day | ORAL | 0 refills | Status: AC

## 2017-04-25 NOTE — Patient Instructions (Signed)
     IF you received an x-ray today, you will receive an invoice from [facility]. Please contact  Radiology at [phone] with questions or concerns regarding your invoice.   IF you received labwork today, you will receive an invoice from [facility]. Please contact [facility] at [phone] with questions or concerns regarding your invoice.   Our billing staff will not be able to assist you with questions regarding bills from these companies.  You will be contacted with the lab results as soon as they are available. The fastest way to get your results is to activate your My Chart account. Instructions are located on the last page of this paperwork. If you have not heard from us regarding the results in 2 weeks, please contact this office.     

## 2017-04-25 NOTE — Progress Notes (Signed)
Chief Complaint  Patient presents with  . Follow-up    bronchititis    HPI  Pt reports that she continues to have yellow mucus and nausea for 2 weeks ago.l  She was prescribed Minocycline for bronchitis.   She has a history of COPD But reports that she has been having continued cough with yellow mucus  She is taking mucinex but continues to smoke She is doing her albuterol nebulizer once a day She only does it once a day due to the cost She wears a vest and does daily chest PT    Past Medical History:  Diagnosis Date  . Allergy   . Anxiety   . Asthma   . Chronic bronchitis ([place])    "get it alot; maybe not q yr" (07/06/2014)  . COPD (chronic obstructive pulmonary disease) ([place])   . Depression    psychiatrist Dr. Toy Care  . Diastolic dysfunction   . Emphysematous COPD ([place])    changes in right base along with patchy areas on last x-ray  . GERD (gastroesophageal reflux disease)   . HCAP (healthcare-associated pneumonia) 02/12/2016  . Heart murmur   . History of cardiovascular stress test 08/15/2004   EF of 70% -- Normal stress cardiolite.  There is no evidence of ischemia and there is normal LV function. -- [name]. MD  . History of echocardiogram 12/24/2006   Est. EF of 42-59% NORMAL LV SYSTOLIC FUNCTION WITH IMPAIRED RELAXATION -- MILD AORTIC SCLEROSIS -- NORMAL PALONARY ARTERY PRESSURE -- NO OLD ECHOS FOR COMPARISON -- [name], MD  . Hypertension    essential hypertension  . Necrotic pneumonia ([place])    recurrent/notes 02/12/2016  . Pneumonia    "several times since May 2015" (07/06/2014)  . Tobacco abuse    smoking about 1/2 pk of cigarettes a day    Current Outpatient Prescriptions  Medication Sig Dispense Refill  . albuterol (PROVENTIL) (2.5 MG/3ML) 0.083% nebulizer solution TAKE 3 MLS (2.5 MG TOTAL) BY NEBULIZATION EVERY 6 (SIX) HOURS AS NEEDED FOR SHORTNESS OF BREATH. 150 mL 1  . ALPRAZolam (XANAX) 0.5 MG tablet TAKE 1 TABLET BY MOUTH AT BEDTIME 30  tablet 0  . benzonatate (TESSALON) 100 MG capsule TAKE 1 TO 2 CAPSULES BY MOUTH 3 TIMES A DAY AS NEEDED FOR COUGH 60 capsule 2  . divalproex (DEPAKOTE ER) 500 MG 24 hr tablet Take 1 tablet (500 mg total) by mouth at bedtime. 30 tablet 5  . guaiFENesin (MUCINEX) 600 MG 12 hr tablet Take 1 tablet (600 mg total) by mouth 2 (two) times daily. 60 tablet 0  . nortriptyline (PAMELOR) 50 MG capsule Take 2 capsules (100 mg total) by mouth Nightly. 60 capsule 5  . spironolactone (ALDACTONE) 50 MG tablet TAKE 1 TABLET EVERY DAY 90 tablet 1  . tiZANidine (ZANAFLEX) 4 MG tablet TAKE 1 TABLET BY MOUTH EVERY 8 HOURS AS NEEDED FOR MUSCLE SPASMS 45 tablet 2  . HYDROcodone-acetaminophen (NORCO/VICODIN) 5-325 MG tablet Take 0.5-1 tablets by mouth every 6 (six) hours as needed for moderate pain. (Patient not taking: Reported on 04/25/2017) 40 tablet 0  . predniSONE (DELTASONE) 20 MG tablet Take 2 tablets (40 mg total) by mouth daily with breakfast. 10 tablet 0  . triamcinolone cream (KENALOG) 0.1 % Apply 1 application topically 2 (two) times daily. (Patient not taking: Reported on 04/25/2017) 30 g 0   No current facility-administered medications for this visit.     Allergies:  Allergies  Allergen Reactions  . Ceclor [Cefaclor] Hives  Tolerated ceftazidime December 2016  . Escitalopram Oxalate     Pt does not recall ever taking medication  . Sertraline Hcl     Severe Headache  . Sulfa Drugs Cross Reactors Hives and Rash    Hives and rash    Past Surgical History:  Procedure Laterality Date  . DILATION AND CURETTAGE OF UTERUS    . TUBAL LIGATION  1986    Social History   Social History  . Marital status: Married    Spouse name: N/A  . Number of children: N/A  . Years of education: N/A   Occupational History  . 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 Smoking status: Current Every Day Smoker    Packs/day: 1.00    Years: 44.00    Types: Cigarettes  . Smokeless  tobacco: Never Used     Comment: down to 5-6 cigarettes a day   . Alcohol use 0.6 oz/week    1 Glasses of wine per week  . Drug use: No  . Sexual activity: No   Other Topics Concern  . None   Social History Narrative   Patient currently lives with her husband.    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 No bird or hot tub exposure. No mold exposure. No recent travel.    Review of Systems  Constitutional: Negative for chills and fever.  Respiratory: Positive for cough, sputum production, shortness of breath and wheezing.   Cardiovascular: Negative for chest pain and palpitations.    Objective:  Vitals:   04/25/17 1317 04/25/17 1421  BP: (!) 164/93 (!) 144/98  Pulse: (!) 103   Resp: 16   Temp: 97.9 F (36.6 C)   TempSrc: Oral   SpO2: 98%   Weight: 128 lb 12.8 oz (58.4 kg)   Height: 5\' 2"  (1.575 m)     Physical Exam General: alert, oriented, in NAD Head: normocephalic, atraumatic, no sinus tenderness Eyes: EOM intact, no scleral icterus or conjunctival injection Heart: normal rate, normal sinus rhythm, no murmurs Lungs: RIGHT lung with chronic persistent rales  Left lung clear to auscultation   Assessment and Plan Catherine Kelley was seen today for follow-up.  Diagnoses and all orders for this visit:  COPD GOLD II/ still smoking- discussed increased albuterol to AT LEAST bid  Bronchiectasis with acute exacerbation ([place])- increase albuterol and add prednisone  Essential hypertension- pt smoked on the way in and after waiting an hour bp improved  Other orders -     predniSONE (DELTASONE) 20 MG tablet; Take 2 tablets (40 mg total) by mouth daily with breakfast.     Kekai Geter A Nolon Rod

## 2017-04-27 NOTE — Telephone Encounter (Signed)
03/29/17 last refill

## 2017-04-28 NOTE — Telephone Encounter (Signed)
Done

## 2017-04-28 NOTE — Telephone Encounter (Signed)
[facility] 

## 2017-05-25 ENCOUNTER — Other Ambulatory Visit: Payer: Self-pay | Admitting: Physician Assistant

## 2017-05-25 DIAGNOSIS — F418 Other specified anxiety disorders: Secondary | ICD-10-CM

## 2017-05-25 NOTE — Telephone Encounter (Signed)
Please advise 

## 2017-05-25 NOTE — Telephone Encounter (Signed)
Patient scheduled OV with Windell Hummingbird for 05/26/17 for follow-up.  Refill denied.

## 2017-05-26 ENCOUNTER — Encounter: Payer: Self-pay | Admitting: Physician Assistant

## 2017-05-26 ENCOUNTER — Ambulatory Visit (INDEPENDENT_AMBULATORY_CARE_PROVIDER_SITE_OTHER): Payer: Medicare Other | Admitting: Physician Assistant

## 2017-05-26 VITALS — BP 141/79 | HR 98 | Temp 97.9°F | Resp 18 | Ht 62.0 in | Wt 128.2 lb

## 2017-05-26 DIAGNOSIS — M5416 Radiculopathy, lumbar region: Secondary | ICD-10-CM

## 2017-05-26 DIAGNOSIS — M5417 Radiculopathy, lumbosacral region: Secondary | ICD-10-CM

## 2017-05-26 DIAGNOSIS — Z79899 Other long term (current) drug therapy: Secondary | ICD-10-CM | POA: Diagnosis not present

## 2017-05-26 DIAGNOSIS — F39 Unspecified mood [affective] disorder: Secondary | ICD-10-CM

## 2017-05-26 DIAGNOSIS — F5105 Insomnia due to other mental disorder: Secondary | ICD-10-CM

## 2017-05-26 DIAGNOSIS — F418 Other specified anxiety disorders: Secondary | ICD-10-CM

## 2017-05-26 MED ORDER — DIVALPROEX SODIUM ER 500 MG PO TB24: 500.0000 mg | ORAL_TABLET | Freq: Two times a day (BID) | ORAL | 0 refills | Status: DC

## 2017-05-26 MED ORDER — NORTRIPTYLINE HCL 50 MG PO CAPS: 100.0000 mg | ORAL_CAPSULE | Freq: Every evening | ORAL | 5 refills | Status: DC

## 2017-05-26 MED ORDER — TIZANIDINE HCL 4 MG PO TABS: ORAL_TABLET | ORAL | 2 refills | Status: DC

## 2017-05-26 MED ORDER — ALPRAZOLAM 0.5 MG PO TABS: 0.5000 mg | ORAL_TABLET | Freq: Every day | ORAL | 0 refills | Status: DC

## 2017-05-26 NOTE — Patient Instructions (Addendum)
  Increase depakote to 2x/day to see if you can get the dose higher and not had side effects.  Continue Xanax at the same dose.  Return to clinic to have lab work 12 hours after your last depakote dose in the am - then I want you to increase your depakote to 2x/day and then see me in a week so we can repeat labs and check on if you are tolerating the medications.   IF you received an x-ray today, you will receive an invoice from [facility]. Please contact [facility] at [phone] with questions or concerns regarding your invoice.   IF you received labwork today, you will receive an invoice from [place]. Please contact [facility] at [phone] with questions or concerns regarding your invoice.   Our billing staff will not be able to assist you with questions regarding bills from these companies.  You will be contacted with the lab results as soon as they are available. The fastest way to get your results is to activate your My Chart account. Instructions are located on the last page of this paperwork. If you have not heard from [name] regarding the results in 2 weeks, please contact this office.

## 2017-05-26 NOTE — Progress Notes (Signed)
Catherine Kelley  MRN: [id number] DOB: 1951/03/05  PCP: Wardell Honour, MD  Chief Complaint  Patient presents with  . Medication Refill    xanax, depakote, pamelor, zanaflex    Subjective:  Pt presents to clinic for medication refill and f/u.  Long problems with depression and had been a patient of Dr Toy Care until she got Medicare. - no interest in doing anything - no motivation or energy - family is worried because after she has been depressed for several months she will many times go through a month or so where she has a lot of energy, needs little sleep and cleans a lot and makes promises that later she knows she cannot keep- Family is worried that she has bipolar - she takes her medication daily - she has been in one of these episodes for the last week - she tried to talk with Dr Toy Care and they tried to increase her depakote but the patient could not tolerate 16m qd due to dizziness.  Her past use of Xanax has been bid prn - she takes nightly and sometimes wants to during the day  Review of Systems  Psychiatric/Behavioral: Positive for dysphoric mood and sleep disturbance. Negative for suicidal ideas. The patient is nervous/anxious.     Patient Active Problem List   Diagnosis Date Noted  . Tobacco abuse 02/12/2016  . Other fatigue 02/04/2016  . Respiratory failure with hypoxia ([place] 01/09/2016  . Malnutrition of moderate degree 11/03/2015  . Pulmonary emphysema ([place]   . Community acquired pneumonia   . Pneumonia 11/01/2015  . Essential hypertension 09/27/2015  . Rhabdomyolysis 08/16/2015  . Chronic cough 06/07/2015  . Bronchiectasis with acute exacerbation ([place] 03/07/2015  . COPD, moderate ([place] 12/13/2014  . Cigarette smoker 12/13/2014  . COPD GOLD II/ still smoking 10/11/2014  . Lumbar back pain with radiculopathy affecting right lower extremity 09/28/2014  . Hyponatremia 07/09/2014  . Hx of recurrent pneumonia 07/08/2014  . Venous insufficiency (chronic) (peripheral)  09/29/2012  . Cellulitis and abscess of foot, except toes 09/21/2012  . Lower extremity edema 03/09/2012  . Benign hypertensive heart disease without heart failure 05/09/2011  . Chest pain 05/09/2011  . Dyslipidemia 05/09/2011  . Depression 05/09/2011  . Osteoarthritis 05/09/2011    Current Outpatient Prescriptions on File Prior to Visit  Medication Sig Dispense Refill  . albuterol (PROVENTIL) (2.5 MG/3ML) 0.083% nebulizer solution TAKE 3 MLS (2.5 MG TOTAL) BY NEBULIZATION EVERY 6 (SIX) HOURS AS NEEDED FOR SHORTNESS OF BREATH. 150 mL 1  . benzonatate (TESSALON) 100 MG capsule TAKE 1 TO 2 CAPSULES BY MOUTH 3 TIMES A DAY AS NEEDED FOR COUGH 60 capsule 2  . guaiFENesin (MUCINEX) 600 MG 12 hr tablet Take 1 tablet (600 mg total) by mouth 2 (two) times daily. 60 tablet 0  . HYDROcodone-acetaminophen (NORCO/VICODIN) 5-325 MG tablet Take 0.5-1 tablets by mouth every 6 (six) hours as needed for moderate pain. 40 tablet 0  . spironolactone (ALDACTONE) 50 MG tablet TAKE 1 TABLET EVERY DAY 90 tablet 1  . triamcinolone cream (KENALOG) 0.1 % Apply 1 application topically 2 (two) times daily. 30 g 0   No current facility-administered medications on file prior to visit.     Allergies  Allergen Reactions  . Ceclor [Cefaclor] Hives    Tolerated ceftazidime December 2016  . Escitalopram Oxalate     Pt does not recall ever taking medication  . Sertraline Hcl     Severe Headache  . Sulfa Drugs Cross Reactors Hives  and Rash    Hives and rash    Pt patients past, family and social history were reviewed and updated.   Objective:  BP (!) 141/79   Pulse 98   Temp 97.9 F (36.6 C) (Oral)   Resp 18   Ht _0  (1.575 m)   Wt 128 lb 3.2 oz (58.2 kg)   SpO2 94%   BMI 23.45 kg/m   Physical Exam  Constitutional: She is oriented to person, place, and time and well-developed, well-nourished, and in no distress.  HENT:  Head: Normocephalic and atraumatic.  Right Ear: Hearing and external ear normal.    Left Ear: Hearing and external ear normal.  Eyes: Conjunctivae are normal.  Neck: Normal range of motion.  Cardiovascular: Normal rate, regular rhythm and normal heart sounds.   No murmur heard. Pulmonary/Chest: Effort normal and breath sounds normal. She has no wheezes.  Neurological: She is alert and oriented to person, place, and time. Gait normal.  Skin: Skin is warm and dry.  Psychiatric: Mood, memory, affect and judgment normal.  Pressured speech - different than before - typically very calm and quiet - also very dressed up with makeup on which I have never seen before Tangential speech - flight of ideas Very talkative - different than normal for the patient  Vitals reviewed.   Wt Readings from Last 3 Encounters:  05/26/17 128 lb 3.2 oz (58.2 kg)  04/25/17 128 lb 12.8 oz (58.4 kg)  04/09/17 131 lb (59.4 kg)    Assessment and Plan :  Mood disorder ([place]) - Plan: Valproic acid level, Ambulatory referral to Psychiatry  Insomnia secondary to depression with anxiety - Plan: ALPRAZolam (XANAX) 0.5 MG tablet  Lumbar back pain with radiculopathy affecting right lower extremity - Plan: tiZANidine (ZANAFLEX) 4 MG tablet  Anxiety associated with depression - Plan: nortriptyline (PAMELOR) 50 MG capsule, divalproex (DEPAKOTE ER) 500 MG 24 hr tablet, Ambulatory referral to Psychiatry  High risk medication use - Plan: CMP14+EGFR, Ambulatory referral to Psychiatry  Concern that pt has bioplar as I have never seen her like this before - she is not a threat to herself - she will return tomorrow to have her lab levels drawn as she has always been low on her current depakote level and then she will try bid dosing of 500g to see if she can tolerate it - we will do a referral to find someone who takes medicare for psychiatric help as I think she warrants a psychiatrist at this time - she will recheck with me in 5 days and we will recheck her levels if she has been able to tolerate the increase in  dose.  For now we will continue her other medications at the same dosages.  Windell Hummingbird PA-C  [facility] 05/29/2017 8:23 PM

## 2017-05-27 ENCOUNTER — Other Ambulatory Visit: Payer: Medicare Other | Admitting: Emergency Medicine

## 2017-05-27 DIAGNOSIS — F39 Unspecified mood [affective] disorder: Secondary | ICD-10-CM

## 2017-05-27 DIAGNOSIS — Z79899 Other long term (current) drug therapy: Secondary | ICD-10-CM | POA: Diagnosis not present

## 2017-05-28 LAB — VALPROIC ACID LEVEL: VALPROIC ACID LVL: 38 ug/mL — AB (ref 50–100)

## 2017-05-28 LAB — CMP14+EGFR
ALK PHOS: 166 IU/L — AB (ref 39–117)
ALT: 23 IU/L (ref 0–32)
AST: 20 IU/L (ref 0–40)
Albumin/Globulin Ratio: 1.3 (ref 1.2–2.2)
Albumin: 4 g/dL (ref 3.6–4.8)
BILIRUBIN TOTAL: 0.5 mg/dL (ref 0.0–1.2)
BUN / CREAT RATIO: 12 (ref 12–28)
BUN: 9 mg/dL (ref 8–27)
CHLORIDE: 96 mmol/L (ref 96–106)
CO2: 24 mmol/L (ref 20–29)
CREATININE: 0.73 mg/dL (ref 0.57–1.00)
Calcium: 9.4 mg/dL (ref 8.7–10.3)
GFR calc Af Amer: 99 mL/min/{1.73_m2} (ref >59)
GFR calc non Af Amer: 86 mL/min/{1.73_m2} (ref >59)
GLUCOSE: 121 mg/dL — AB (ref 65–99)
Globulin, Total: 3.2 g/dL (ref 1.5–4.5)
Potassium: 3.9 mmol/L (ref 3.5–5.2)
Sodium: 138 mmol/L (ref 134–144)
Total Protein: 7.2 g/dL (ref 6.0–8.5)

## 2017-05-30 ENCOUNTER — Ambulatory Visit: Payer: Self-pay | Admitting: Urgent Care

## 2017-06-02 ENCOUNTER — Encounter: Payer: Self-pay | Admitting: Physician Assistant

## 2017-06-02 ENCOUNTER — Ambulatory Visit (INDEPENDENT_AMBULATORY_CARE_PROVIDER_SITE_OTHER): Payer: Medicare Other | Admitting: Physician Assistant

## 2017-06-02 VITALS — BP 132/76 | HR 97 | Temp 97.6°F | Resp 18 | Ht 62.0 in | Wt 130.6 lb

## 2017-06-02 DIAGNOSIS — L0390 Cellulitis, unspecified: Secondary | ICD-10-CM

## 2017-06-02 DIAGNOSIS — M5417 Radiculopathy, lumbosacral region: Secondary | ICD-10-CM

## 2017-06-02 DIAGNOSIS — M5416 Radiculopathy, lumbar region: Secondary | ICD-10-CM

## 2017-06-02 DIAGNOSIS — I872 Venous insufficiency (chronic) (peripheral): Secondary | ICD-10-CM

## 2017-06-02 DIAGNOSIS — Z79899 Other long term (current) drug therapy: Secondary | ICD-10-CM | POA: Diagnosis not present

## 2017-06-02 DIAGNOSIS — F39 Unspecified mood [affective] disorder: Secondary | ICD-10-CM

## 2017-06-02 MED ORDER — HYDROCODONE-ACETAMINOPHEN 5-325 MG PO TABS: 0.5000 | ORAL_TABLET | Freq: Three times a day (TID) | ORAL | 0 refills | Status: DC | PRN

## 2017-06-02 MED ORDER — CEPHALEXIN 500 MG PO CAPS: 500.0000 mg | ORAL_CAPSULE | Freq: Three times a day (TID) | ORAL | 0 refills | Status: DC

## 2017-06-02 NOTE — Progress Notes (Signed)
Catherine Kelley  MRN: [id number] DOB: 11/05/1951  PCP: Wardell Honour, MD  Chief Complaint  Patient presents with  . Follow-up    medication  . Back Pain    lower back     Subjective:  Pt presents to clinic for recheck.  Her mood has stabilized slightly since her last visit.  She is tolerating the increase in depakote - it makes her slightly tired but she feels so much better on it that she is wiling to deal with it.  She went to the beach and since she has been back she has noticed that her feet are getting red - the left worse than the right - they are also swollen - she is not sure if the swelling changes during the day but they are getting tight and they feel hot.  She has had no fevers or chills.  She is also having pain in her back from old injury and chronic degenerative changes - she takes her muscle relaxer at night but is having a lot of pain with sleep and would like some pain medication.  Review of Systems  Constitutional: Negative for chills and fever.  Musculoskeletal: Positive for back pain.  Psychiatric/Behavioral: Positive for dysphoric mood and sleep disturbance (less).    Patient Active Problem List   Diagnosis Date Noted  . Tobacco abuse 02/12/2016  . Other fatigue 02/04/2016  . Respiratory failure with hypoxia ([place]) 01/09/2016  . Malnutrition of moderate degree 11/03/2015  . Pulmonary emphysema ([place])   . Community acquired pneumonia   . Pneumonia 11/01/2015  . Essential hypertension 09/27/2015  . Rhabdomyolysis 08/16/2015  . Chronic cough 06/07/2015  . Bronchiectasis with acute exacerbation ([place]) 03/07/2015  . COPD, moderate ([place]) 12/13/2014  . Cigarette smoker 12/13/2014  . COPD GOLD II/ still smoking 10/11/2014  . Lumbar back pain with radiculopathy affecting right lower extremity 09/28/2014  . Hyponatremia 07/09/2014  . Hx of recurrent pneumonia 07/08/2014  . Venous insufficiency (chronic) (peripheral) 09/29/2012  . Lower extremity edema 03/09/2012    . Benign hypertensive heart disease without heart failure 05/09/2011  . Chest pain 05/09/2011  . Dyslipidemia 05/09/2011  . Mood disorder ([place]) 05/09/2011  . Osteoarthritis 05/09/2011    Current Outpatient Prescriptions on File Prior to Visit  Medication Sig Dispense Refill  . albuterol (PROVENTIL) (2.5 MG/3ML) 0.083% nebulizer solution TAKE 3 MLS (2.5 MG TOTAL) BY NEBULIZATION EVERY 6 (SIX) HOURS AS NEEDED FOR SHORTNESS OF BREATH. 150 mL 1  . ALPRAZolam (XANAX) 0.5 MG tablet Take 1 tablet (0.5 mg total) by mouth at bedtime. 30 tablet 0  . benzonatate (TESSALON) 100 MG capsule TAKE 1 TO 2 CAPSULES BY MOUTH 3 TIMES A DAY AS NEEDED FOR COUGH 60 capsule 2  . divalproex (DEPAKOTE ER) 500 MG 24 hr tablet Take 1 tablet (500 mg total) by mouth 2 (two) times daily. 60 tablet 0  . guaiFENesin (MUCINEX) 600 MG 12 hr tablet Take 1 tablet (600 mg total) by mouth 2 (two) times daily. 60 tablet 0  . nortriptyline (PAMELOR) 50 MG capsule Take 2 capsules (100 mg total) by mouth Nightly. 60 capsule 5  . spironolactone (ALDACTONE) 50 MG tablet TAKE 1 TABLET EVERY DAY 90 tablet 1  . tiZANidine (ZANAFLEX) 4 MG tablet TAKE 1 TABLET BY MOUTH EVERY 8 HOURS AS NEEDED FOR MUSCLE SPASMS 45 tablet 2  . triamcinolone cream (KENALOG) 0.1 % Apply 1 application topically 2 (two) times daily. 30 g 0   No current facility-administered medications  on file prior to visit.     Allergies  Allergen Reactions  . Ceclor [Cefaclor] Hives    Tolerated ceftazidime December 2016  . Escitalopram Oxalate     Pt does not recall ever taking medication  . Sertraline Hcl     Severe Headache  . Sulfa Drugs Cross Reactors Hives and Rash    Hives and rash    Pt patients past, family and social history were reviewed and updated.   Objective:  BP 132/76   Pulse 97   Temp 97.6 F (36.4 C) (Oral)   Resp 18   Ht 5\' 2"  (1.575 m)   Wt 130 lb 9.6 oz (59.2 kg)   SpO2 93%   BMI 23.89 kg/m   Physical Exam  Constitutional: She is  oriented to person, place, and time and well-developed, well-nourished, and in no distress.  HENT:  Head: Normocephalic and atraumatic.  Right Ear: Hearing and external ear normal.  Left Ear: Hearing and external ear normal.  Eyes: Conjunctivae are normal.  Neck: Normal range of motion.  Pulmonary/Chest: Effort normal.  Musculoskeletal:  Bilateral feet - erythema and edema - the left >right - good pulses - warm to touch - known peripheral vascular disease - no open lesions  Neurological: She is alert and oriented to person, place, and time. Gait normal.  Skin: Skin is warm and dry.  Psychiatric: Mood, memory and affect normal. She expresses impulsivity (improved from last visit). She exhibits disordered thought content (improved since last visit).  Decreased pressured speech - calmer - thoughts are more organized  Vitals reviewed.   Assessment and Plan :  Medication management - Plan: Valproic acid level  Venous insufficiency (chronic) (peripheral) - Plan: cephALEXin (KEFLEX) 500 MG capsule  Lumbar back pain with radiculopathy affecting right lower extremity - Plan: HYDROcodone-acetaminophen (NORCO/VICODIN) 5-325 MG tablet  Cellulitis, unspecified cellulitis site - Plan: cephALEXin (KEFLEX) 500 MG capsule - likely related to being at the beach and peripheral vascular disease - elevate feet and use abx - recheck in 4 days unless almost resolved at that time  Mood disorder ([place]) - continue depakote at increased dose - she is less manic today - check lab levels with the increased dose - a referral for psychiatry is in process - depending on when she can get in and her lab results will depend on her next visit.  Windell Hummingbird PA-C  [facility] 06/02/2017 5:30 PM

## 2017-06-02 NOTE — Patient Instructions (Signed)
     IF you received an x-ray today, you will receive an invoice from [facility]. Please contact [facility] at [phone] with questions or concerns regarding your invoice.   IF you received labwork today, you will receive an invoice from [facility]. Please contact [facility] at [phone] with questions or concerns regarding your invoice.   Our billing staff will not be able to assist you with questions regarding bills from these companies.  You will be contacted with the lab results as soon as they are available. The fastest way to get your results is to activate your My Chart account. Instructions are located on the last page of this paperwork. If you have not heard from us regarding the results in 2 weeks, please contact this office.     

## 2017-06-03 LAB — VALPROIC ACID LEVEL: VALPROIC ACID LVL: 33 ug/mL — AB (ref 50–100)

## 2017-06-06 ENCOUNTER — Ambulatory Visit: Payer: Self-pay | Admitting: Physician Assistant

## 2017-06-08 ENCOUNTER — Other Ambulatory Visit: Payer: Self-pay | Admitting: Family Medicine

## 2017-06-08 DIAGNOSIS — R600 Localized edema: Secondary | ICD-10-CM

## 2017-06-20 ENCOUNTER — Other Ambulatory Visit: Payer: Self-pay | Admitting: Physician Assistant

## 2017-06-20 DIAGNOSIS — F418 Other specified anxiety disorders: Secondary | ICD-10-CM

## 2017-06-20 DIAGNOSIS — F5105 Insomnia due to other mental disorder: Principal | ICD-10-CM

## 2017-06-23 ENCOUNTER — Telehealth: Payer: Self-pay | Admitting: Physician Assistant

## 2017-06-23 DIAGNOSIS — F418 Other specified anxiety disorders: Secondary | ICD-10-CM

## 2017-06-23 DIAGNOSIS — F5105 Insomnia due to other mental disorder: Principal | ICD-10-CM

## 2017-06-23 NOTE — Telephone Encounter (Signed)
Where are we with her referral to psychiatry?

## 2017-06-24 ENCOUNTER — Telehealth: Payer: Self-pay

## 2017-06-24 NOTE — Telephone Encounter (Signed)
Called in rx for xanax.

## 2017-06-26 NOTE — Telephone Encounter (Signed)
Courtesy refill provided. Patient is to rtc to f/u with PA-Weber. Please let patient know her script is ready for pick up.

## 2017-07-01 NOTE — Telephone Encounter (Signed)
RETURNING JILL'S CALL. BEST PHONE [phone] (CELL) [place]

## 2017-07-01 NOTE — Telephone Encounter (Signed)
Left message to return call 

## 2017-07-02 NOTE — Telephone Encounter (Signed)
Left message on voicemail. Informed her of courtesy refill.

## 2017-07-13 ENCOUNTER — Other Ambulatory Visit: Payer: Self-pay | Admitting: Physician Assistant

## 2017-07-13 DIAGNOSIS — J449 Chronic obstructive pulmonary disease, unspecified: Secondary | ICD-10-CM

## 2017-07-17 ENCOUNTER — Other Ambulatory Visit: Payer: Self-pay | Admitting: Physician Assistant

## 2017-07-17 DIAGNOSIS — F418 Other specified anxiety disorders: Secondary | ICD-10-CM

## 2017-07-17 NOTE — Telephone Encounter (Signed)
Refill request for albuterol 0.083% nebulizer solution approved for 30 day supply with no refills

## 2017-07-20 ENCOUNTER — Ambulatory Visit (INDEPENDENT_AMBULATORY_CARE_PROVIDER_SITE_OTHER): Payer: Medicare Other | Admitting: Physician Assistant

## 2017-07-20 ENCOUNTER — Encounter: Payer: Self-pay | Admitting: Physician Assistant

## 2017-07-20 VITALS — BP 150/75 | HR 80 | Temp 98.1°F | Resp 18 | Ht 62.0 in | Wt 128.2 lb

## 2017-07-20 DIAGNOSIS — R8299 Other abnormal findings in urine: Secondary | ICD-10-CM

## 2017-07-20 DIAGNOSIS — I872 Venous insufficiency (chronic) (peripheral): Secondary | ICD-10-CM | POA: Diagnosis not present

## 2017-07-20 DIAGNOSIS — F39 Unspecified mood [affective] disorder: Secondary | ICD-10-CM

## 2017-07-20 DIAGNOSIS — R7309 Other abnormal glucose: Secondary | ICD-10-CM

## 2017-07-20 DIAGNOSIS — Z79899 Other long term (current) drug therapy: Secondary | ICD-10-CM | POA: Diagnosis not present

## 2017-07-20 DIAGNOSIS — R748 Abnormal levels of other serum enzymes: Secondary | ICD-10-CM

## 2017-07-20 DIAGNOSIS — N939 Abnormal uterine and vaginal bleeding, unspecified: Secondary | ICD-10-CM | POA: Diagnosis not present

## 2017-07-20 DIAGNOSIS — R82998 Other abnormal findings in urine: Secondary | ICD-10-CM

## 2017-07-20 DIAGNOSIS — L0390 Cellulitis, unspecified: Secondary | ICD-10-CM

## 2017-07-20 DIAGNOSIS — J449 Chronic obstructive pulmonary disease, unspecified: Secondary | ICD-10-CM | POA: Diagnosis not present

## 2017-07-20 DIAGNOSIS — R739 Hyperglycemia, unspecified: Secondary | ICD-10-CM

## 2017-07-20 LAB — POCT URINALYSIS DIP (MANUAL ENTRY)
BILIRUBIN UA: NEGATIVE mg/dL
Bilirubin, UA: NEGATIVE
Glucose, UA: NEGATIVE mg/dL
Nitrite, UA: NEGATIVE
PH UA: 7.5 (ref 5.0–8.0)
PROTEIN UA: NEGATIVE mg/dL
RBC UA: NEGATIVE
SPEC GRAV UA: 1.01 (ref 1.010–1.025)
UROBILINOGEN UA: 0.2 U/dL

## 2017-07-20 MED ORDER — CEPHALEXIN 500 MG PO CAPS: 500.0000 mg | ORAL_CAPSULE | Freq: Four times a day (QID) | ORAL | 0 refills | Status: DC

## 2017-07-20 MED ORDER — CEPHALEXIN 500 MG PO CAPS: 500.0000 mg | ORAL_CAPSULE | Freq: Three times a day (TID) | ORAL | 0 refills | Status: AC

## 2017-07-20 MED ORDER — ALBUTEROL SULFATE (2.5 MG/3ML) 0.083% IN NEBU: INHALATION_SOLUTION | RESPIRATORY_TRACT | 0 refills | Status: DC

## 2017-07-20 NOTE — Patient Instructions (Addendum)
  Please think about getting some compression socks to help with the swelling - please elevate your feet to decrease your swelling - the more you feet swell the greater chances of getting infection in your feet with the swelling   IF you received an x-ray today, you will receive an invoice from [facility]. Please contact [facility] at [phone] with questions or concerns regarding your invoice.   IF you received labwork today, you will receive an invoice from [place]. Please contact [facility] at [phone] with questions or concerns regarding your invoice.   Our billing staff will not be able to assist you with questions regarding bills from these companies.  You will be contacted with the lab results as soon as they are available. The fastest way to get your results is to activate your My Chart account. Instructions are located on the last page of this paperwork. If you have not heard from [name] regarding the results in 2 weeks, please contact this office.

## 2017-07-20 NOTE — Progress Notes (Signed)
Catherine Kelley  MRN: [id number] DOB: [birth year]  PCP: Wardell Honour, MD  Chief Complaint  Patient presents with  . Cellulitis    on both legs and has fluid     Subjective:  Pt presents to clinic for concerns that her feet are getting red again and her left foot is swelling.  She has not been elevating her feet recently.  She felt like the swelling that she had before like this went away with the Keflex that she tolerated fine.  Mood is stabilized.  Currently she does not feel depressed.  She is trying to do more and keep up her energy currently - hanging out with her daughters and grandchildren has been helpful.  She is sleeping well at night.  She has not seen psychiatry.  Pt wears panty liner due to stress incontinence and she has noticed for about the last year that in the evening there is a pink tinged color.  She has not noticed any vaginal discharge and no blood when wiping.  She is not sexually active but she does masturbate but she has not noticed a change in color on panty liner afterwards.  No urinary symptoms.  No symptoms of vaginal dryness.  She needs refills on her Albuterol - she is still smoking.  History is obtained by patient.  Review of Systems  Constitutional: Negative for chills and fever.  Eyes: Negative for visual disturbance.  Respiratory: Negative for shortness of breath.   Cardiovascular: Negative for chest pain, palpitations and leg swelling.  Neurological: Negative for dizziness, light-headedness and headaches.    Patient Active Problem List   Diagnosis Date Noted  . Tobacco abuse 02/12/2016  . Other fatigue 02/04/2016  . Respiratory failure with hypoxia ([place]) 01/09/2016  . Malnutrition of moderate degree 11/03/2015  . Pulmonary emphysema ([place])   . Community acquired pneumonia   . Pneumonia 11/01/2015  . Essential hypertension 09/27/2015  . Rhabdomyolysis 08/16/2015  . Chronic cough 06/07/2015  . Bronchiectasis with acute exacerbation ([place])  03/07/2015  . COPD, moderate ([place]) 12/13/2014  . Cigarette smoker 12/13/2014  . COPD GOLD II/ still smoking 10/11/2014  . Lumbar back pain with radiculopathy affecting right lower extremity 09/28/2014  . Hyponatremia 07/09/2014  . Hx of recurrent pneumonia 07/08/2014  . Venous insufficiency (chronic) (peripheral) 09/29/2012  . Lower extremity edema 03/09/2012  . Benign hypertensive heart disease without heart failure 05/09/2011  . Chest pain 05/09/2011  . Dyslipidemia 05/09/2011  . Mood disorder ([place]) 05/09/2011  . Osteoarthritis 05/09/2011    Current Outpatient Prescriptions on File Prior to Visit  Medication Sig Dispense Refill  . ALPRAZolam (XANAX) 0.5 MG tablet TAKE 1 TABLET BY MOUTH AT BEDTIME 30 tablet 0  . benzonatate (TESSALON) 100 MG capsule TAKE 1 TO 2 CAPSULES BY MOUTH 3 TIMES A DAY AS NEEDED FOR COUGH 60 capsule 2  . divalproex (DEPAKOTE ER) 500 MG 24 hr tablet TAKE 1 TABLET BY MOUTH TWICE A DAY 60 tablet 0  . guaiFENesin (MUCINEX) 600 MG 12 hr tablet Take 1 tablet (600 mg total) by mouth 2 (two) times daily. 60 tablet 0  . HYDROcodone-acetaminophen (NORCO/VICODIN) 5-325 MG tablet Take 0.5-1 tablets by mouth every 8 (eight) hours as needed for moderate pain. 15 tablet 0  . nortriptyline (PAMELOR) 50 MG capsule Take 2 capsules (100 mg total) by mouth Nightly. 60 capsule 5  . spironolactone (ALDACTONE) 50 MG tablet TAKE 1 TABLET BY MOUTH EVERY DAY 90 tablet 1  . tiZANidine (ZANAFLEX) 4  MG tablet TAKE 1 TABLET BY MOUTH EVERY 8 HOURS AS NEEDED FOR MUSCLE SPASMS 45 tablet 2  . triamcinolone cream (KENALOG) 0.1 % Apply 1 application topically 2 (two) times daily. 30 g 0   No current facility-administered medications on file prior to visit.     Allergies  Allergen Reactions  . Ceclor [Cefaclor] Hives    Tolerated ceftazidime December 2016  . Escitalopram Oxalate     Pt does not recall ever taking medication  . Sertraline Hcl     Severe Headache  . Sulfa Drugs Cross  Reactors Hives and Rash    Hives and rash    Past Medical History:  Diagnosis Date  . Allergy   . Anxiety   . Asthma   . Chronic bronchitis ([place])    "get it alot; maybe not q yr" (07/06/2014)  . COPD (chronic obstructive pulmonary disease) ([place])   . Depression    psychiatrist Dr. Toy Care  . Diastolic dysfunction   . Emphysematous COPD ([place])    changes in right base along with patchy areas on last x-ray  . GERD (gastroesophageal reflux disease)   . HCAP (healthcare-associated pneumonia) 02/12/2016  . Heart murmur   . History of cardiovascular stress test 08/15/2004   EF of 70% -- Normal stress cardiolite.  There is no evidence of ischemia and there is normal LV function. -- [name]. MD  . History of echocardiogram 12/24/2006   Est. EF of 71-24% NORMAL LV SYSTOLIC FUNCTION WITH IMPAIRED RELAXATION -- MILD AORTIC SCLEROSIS -- NORMAL PALONARY ARTERY PRESSURE -- NO OLD ECHOS FOR COMPARISON -- [name], MD  . Hypertension    essential hypertension  . Necrotic pneumonia ([place])    recurrent/notes 02/12/2016  . Pneumonia    "several times since May 2015" (07/06/2014)  . Tobacco abuse    smoking about 1/2 pk of cigarettes a day   Social History   Social History Narrative   Patient currently lives with her husband.    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 No bird or hot tub exposure. No mold exposure. No recent travel.   Social History  Substance Use Topics  . Smoking status: Current Every Day Smoker    Packs/day: 1.00    Years: 44.00    Types: Cigarettes  . Smokeless tobacco: Never Used     Comment: down to 5-6 cigarettes a day   . Alcohol use 0.6 oz/week    1 Glasses of wine per week   family history includes Asthma in her maternal grandmother; Breast cancer in her maternal aunt; Heart disease in her father; Hyperlipidemia in her father; Hypertension in her father; Stroke  in her mother.     Objective:  BP (!) 150/75   Pulse 80   Temp 98.1 F (36.7 C) (Oral)   Resp 18   Ht '5\' 2"'  (1.575 m)   Wt 128 lb 3.2 oz (58.2 kg)   SpO2 94%   BMI 23.45 kg/m  Body mass index is 23.45 kg/m.  Physical Exam  Constitutional: She is oriented to person, place, and time and well-developed, well-nourished, and in no distress.  HENT:  Head: Normocephalic and atraumatic.  Right Ear: Hearing and external ear normal.  Left Ear: Hearing and external ear normal.  Eyes: Conjunctivae are normal.  Neck: Normal range of motion.  Cardiovascular: Normal rate, regular rhythm  and normal heart sounds.   No murmur heard. Pulmonary/Chest: Effort normal and breath sounds normal.  Musculoskeletal:       Right lower leg: She exhibits no edema.       Left lower leg: She exhibits no edema.       Right foot: There is no swelling (erythema at toes, pulses intact).       Left foot: There is swelling (erythematous - pulses intact - slight decrease in capillary refill but toes are cold).  Many broken vessels in lower legs - no varicose veins visible.  Neurological: She is alert and oriented to person, place, and time. Gait normal.  Skin: Skin is warm and dry.  Psychiatric: Mood, memory, affect and judgment normal.  Vitals reviewed.  Results for orders placed or performed in visit on 07/20/17  POCT urinalysis dipstick  Result Value Ref Range   Color, UA light yellow (A) yellow   Clarity, UA clear clear   Glucose, UA negative negative mg/dL   Bilirubin, UA negative negative   Ketones, POC UA negative negative mg/dL   Spec Grav, UA 1.010 1.010 - 1.025   Blood, UA negative negative   pH, UA 7.5 5.0 - 8.0   Protein Ur, POC negative negative mg/dL   Urobilinogen, UA 0.2 0.2 or 1.0 E.U./dL   Nitrite, UA Negative Negative   Leukocytes, UA Small (1+) (A) Negative    Assessment and Plan :  Venous insufficiency (chronic) (peripheral) - Plan: Ambulatory referral to Vascular Surgery -  encouraged leg elevated and compression socks while waiting on referral.  Cellulitis, unspecified cellulitis site - Plan: cephALEXin (KEFLEX) 500 MG capsule, CMP14+EGFR, DISCONTINUED: cephALEXin (KEFLEX) 500 MG capsule - recurrent cellulitis within a 2 month period - pt needs to be evaluated for peripheral vascular disease  Elevated glucose - Plan: Hemoglobin A1c, CMP14+EGFR - check labs  Vaginal bleeding - Plan: POCT urinalysis dipstick - no blood in urine - we will run a urine culture for possible UTI - if negative - encouraged patient to be mindful of where the blood is coming from - she does not want to have a vaginal exam today but it is possible she is having vaginal dryness with resulting bleeding but without an exam I cannot say for sure - we will check labs and then patient will have to make a decision about gyn referral or RTC for exam.  COPD GOLD II/ still smoking - Plan: albuterol (PROVENTIL) (2.5 MG/3ML) 0.083% nebulizer solution - refilled - pt encouraged to quit smoking  High risk medication - she has taken her depakote within the last 5 hours so we do not want to do a level today.  Windell Hummingbird PA-C  [facility] at [facility] 07/20/2017 3:46 PM

## 2017-07-21 LAB — CMP14+EGFR
A/G RATIO: 1.3 (ref 1.2–2.2)
ALK PHOS: 151 IU/L — AB (ref 39–117)
ALT: 15 IU/L (ref 0–32)
AST: 29 IU/L (ref 0–40)
Albumin: 3.9 g/dL (ref 3.6–4.8)
BILIRUBIN TOTAL: 0.2 mg/dL (ref 0.0–1.2)
BUN/Creatinine Ratio: 13 (ref 12–28)
BUN: 8 mg/dL (ref 8–27)
CALCIUM: 8.9 mg/dL (ref 8.7–10.3)
CHLORIDE: 100 mmol/L (ref 96–106)
CO2: 23 mmol/L (ref 20–29)
Creatinine, Ser: 0.62 mg/dL (ref 0.57–1.00)
GFR calc Af Amer: 109 mL/min/{1.73_m2} (ref >59)
GFR calc non Af Amer: 94 mL/min/{1.73_m2} (ref >59)
Globulin, Total: 3.1 g/dL (ref 1.5–4.5)
Glucose: 84 mg/dL (ref 65–99)
POTASSIUM: 4.2 mmol/L (ref 3.5–5.2)
Sodium: 138 mmol/L (ref 134–144)
Total Protein: 7 g/dL (ref 6.0–8.5)

## 2017-07-21 LAB — HEMOGLOBIN A1C
ESTIMATED AVERAGE GLUCOSE: 103 mg/dL
Hgb A1c MFr Bld: 5.2 % (ref 4.8–5.6)

## 2017-07-22 ENCOUNTER — Other Ambulatory Visit: Payer: Self-pay | Admitting: Physician Assistant

## 2017-07-22 DIAGNOSIS — F5105 Insomnia due to other mental disorder: Principal | ICD-10-CM

## 2017-07-22 DIAGNOSIS — F418 Other specified anxiety disorders: Secondary | ICD-10-CM

## 2017-07-22 NOTE — Telephone Encounter (Signed)
Done

## 2017-07-24 ENCOUNTER — Telehealth: Payer: Self-pay | Admitting: Physician Assistant

## 2017-07-24 ENCOUNTER — Other Ambulatory Visit: Payer: Self-pay | Admitting: Emergency Medicine

## 2017-07-24 MED ORDER — NYSTATIN 100000 UNIT/ML MT SUSP: 5.0000 mL | Freq: Four times a day (QID) | OROMUCOSAL | 0 refills | Status: DC

## 2017-07-24 NOTE — Telephone Encounter (Signed)
Pt wanted to call and let you know that she fell and she is not sure if it's because she is taking the medication divalproex (DEPAKOTE ER) 500 MG.  Please adv,  Contact number [phone]

## 2017-07-24 NOTE — Telephone Encounter (Signed)
Spoke with patient and she had thrush symptoms in the past related to taking antibiotics. Prescribed Nystatin suspension and sent to pharmacy. Advised to return to clinic or go directly to urgent care if no improvement.

## 2017-07-24 NOTE — Telephone Encounter (Signed)
PATIENT SAW SARAH ON Monday AND SHE PRESCRIBED HER TO HAVE CEPHALEXIN (KEFLEX) 500 MG. SHE STATES IT IS CAUSING HER TONGUE AND MOUTH TO BE SORE. SHE EVEN HAS A FEVER BLISTER. CAN SHE HAVE SOMETHING CALLED INTO HER PHARMACY FOR THIS? (I WILL PUT MESSAGE AS HIGH PRIORITY IN CASE IT IS AN ALLERGIC REACTION). BEST PHONE [phone] (CELL) PHARMACY CHOICE IS [facility] ON [address]. [place]

## 2017-07-24 NOTE — Progress Notes (Unsigned)
in

## 2017-07-24 NOTE — Telephone Encounter (Signed)
Script called in to pharmacy  

## 2017-07-28 NOTE — Telephone Encounter (Signed)
Patient states she took the medication and every since she has been very dizzy.  She states since feeling these symptoms she is only taking one dose a day.  She would like to know what to do or if she needs another type of medication.  I advised the patient to only take the one dose a day until she hears back from [name].  Patient understood.

## 2017-07-29 NOTE — Telephone Encounter (Signed)
Needs OV.  

## 2017-07-30 NOTE — Addendum Note (Signed)
Addended by: Mancel Bale on: 07/30/2017 05:39 PM   Modules accepted: Orders

## 2017-08-05 NOTE — Telephone Encounter (Signed)
Pt states that she has cut back on the depakote and is now feeling better. Advised her to schedule and appointment with Judson Roch and patient stated that she would give [name] call back to do so.

## 2017-08-07 ENCOUNTER — Encounter (HOSPITAL_COMMUNITY): Payer: Self-pay

## 2017-08-07 ENCOUNTER — Encounter: Payer: Self-pay | Admitting: Vascular Surgery

## 2017-08-13 ENCOUNTER — Other Ambulatory Visit: Payer: Self-pay | Admitting: Physician Assistant

## 2017-08-13 DIAGNOSIS — F418 Other specified anxiety disorders: Secondary | ICD-10-CM

## 2017-08-19 ENCOUNTER — Other Ambulatory Visit: Payer: Self-pay | Admitting: Physician Assistant

## 2017-08-19 DIAGNOSIS — F5105 Insomnia due to other mental disorder: Principal | ICD-10-CM

## 2017-08-19 DIAGNOSIS — F418 Other specified anxiety disorders: Secondary | ICD-10-CM

## 2017-08-20 NOTE — Telephone Encounter (Signed)
Please advise 

## 2017-08-21 NOTE — Telephone Encounter (Signed)
Done

## 2017-08-22 ENCOUNTER — Telehealth: Payer: Self-pay

## 2017-08-22 NOTE — Telephone Encounter (Signed)
Called in rx for xanax to [facility] pharmacy. Pt is aware.

## 2017-08-30 DIAGNOSIS — Z23 Encounter for immunization: Secondary | ICD-10-CM | POA: Diagnosis not present

## 2017-09-04 ENCOUNTER — Ambulatory Visit (INDEPENDENT_AMBULATORY_CARE_PROVIDER_SITE_OTHER): Payer: Medicare Other

## 2017-09-04 ENCOUNTER — Ambulatory Visit (INDEPENDENT_AMBULATORY_CARE_PROVIDER_SITE_OTHER): Payer: Medicare Other | Admitting: Physician Assistant

## 2017-09-04 ENCOUNTER — Encounter: Payer: Self-pay | Admitting: Physician Assistant

## 2017-09-04 VITALS — BP 142/70 | HR 87 | Temp 98.0°F | Resp 16 | Ht 62.99 in | Wt 127.0 lb

## 2017-09-04 DIAGNOSIS — R059 Cough, unspecified: Secondary | ICD-10-CM

## 2017-09-04 DIAGNOSIS — M5416 Radiculopathy, lumbar region: Secondary | ICD-10-CM

## 2017-09-04 DIAGNOSIS — W19XXXA Unspecified fall, initial encounter: Secondary | ICD-10-CM

## 2017-09-04 DIAGNOSIS — J449 Chronic obstructive pulmonary disease, unspecified: Secondary | ICD-10-CM

## 2017-09-04 DIAGNOSIS — M4186 Other forms of scoliosis, lumbar region: Secondary | ICD-10-CM | POA: Diagnosis not present

## 2017-09-04 DIAGNOSIS — F39 Unspecified mood [affective] disorder: Secondary | ICD-10-CM

## 2017-09-04 DIAGNOSIS — R42 Dizziness and giddiness: Secondary | ICD-10-CM

## 2017-09-04 DIAGNOSIS — R05 Cough: Secondary | ICD-10-CM | POA: Diagnosis not present

## 2017-09-04 DIAGNOSIS — R600 Localized edema: Secondary | ICD-10-CM | POA: Diagnosis not present

## 2017-09-04 MED ORDER — HYDROCODONE-ACETAMINOPHEN 5-325 MG PO TABS: 0.5000 | ORAL_TABLET | Freq: Three times a day (TID) | ORAL | 0 refills | Status: DC | PRN

## 2017-09-04 MED ORDER — SPIRONOLACTONE 25 MG PO TABS: 25.0000 mg | ORAL_TABLET | Freq: Every day | ORAL | 0 refills | Status: DC

## 2017-09-04 MED ORDER — FLUTICASONE PROPIONATE 50 MCG/ACT NA SUSP: 2.0000 | Freq: Every day | NASAL | 6 refills | Status: DC

## 2017-09-04 MED ORDER — SPIRONOLACTONE 25 MG PO TABS: 50.0000 mg | ORAL_TABLET | Freq: Every day | ORAL | 0 refills | Status: DC

## 2017-09-04 MED ORDER — TIZANIDINE HCL 4 MG PO TABS: ORAL_TABLET | ORAL | 2 refills | Status: DC

## 2017-09-04 MED ORDER — ALBUTEROL SULFATE (2.5 MG/3ML) 0.083% IN NEBU: INHALATION_SOLUTION | RESPIRATORY_TRACT | 0 refills | Status: DC

## 2017-09-04 NOTE — Progress Notes (Signed)
Catherine Kelley  MRN: [id number] DOB: December 04, 1950  PCP: Mancel Bale, PA-C  Chief Complaint  Patient presents with  . Fall    pt states she had a fall about 1 week ago and now having pan in her back and hip. Pain morw on the right side     Subjective:  Pt presents to clinic for multiple concerns none of which she talks about long as patient is in mania currenrly and has been in it for about a week.  She has needed less sleep, more irritable, changes the color of her nail polish and dresses up more when she is like this.  She has been using her depakote but recently she decreased the amount because she thought it made her dizzy and she has fallen a few times from stumbling as a result - she is not sure if the decrease has helped as she feel a few days ago landing on her right hip which hurts.  After she fell she also has had some increase in her back pain.  She has zanaflex that helps and some hydrocodone which she is using a 1/2 dose but she is out.    Smoker - unsure the amount because she and husband share a pack - about 1/2- ppd - she does not that when she is depressed she almost smokes none.  History is obtained by patient.  Review of Systems  Respiratory: Positive for cough (normal for her).   Musculoskeletal: Positive for back pain (musculoskeletal). Negative for gait problem.  Neurological: Positive for light-headedness (random).  Psychiatric/Behavioral: Positive for dysphoric mood and sleep disturbance. Negative for decreased concentration. The patient is nervous/anxious. The patient is not hyperactive.     Patient Active Problem List   Diagnosis Date Noted  . Tobacco abuse 02/12/2016  . Other fatigue 02/04/2016  . Respiratory failure with hypoxia ([place]) 01/09/2016  . Malnutrition of moderate degree 11/03/2015  . Pulmonary emphysema ([place])   . Community acquired pneumonia   . Pneumonia 11/01/2015  . Essential hypertension 09/27/2015  . Rhabdomyolysis 08/16/2015  . Chronic  cough 06/07/2015  . Bronchiectasis with acute exacerbation ([place]) 03/07/2015  . COPD, moderate ([place]) 12/13/2014  . Cigarette smoker 12/13/2014  . COPD GOLD II/ still smoking 10/11/2014  . Lumbar back pain with radiculopathy affecting right lower extremity 09/28/2014  . Hyponatremia 07/09/2014  . Hx of recurrent pneumonia 07/08/2014  . Venous insufficiency (chronic) (peripheral) 09/29/2012  . Lower extremity edema 03/09/2012  . Benign hypertensive heart disease without heart failure 05/09/2011  . Chest pain 05/09/2011  . Dyslipidemia 05/09/2011  . Mood disorder ([place]) 05/09/2011  . Osteoarthritis 05/09/2011    Current Outpatient Prescriptions on File Prior to Visit  Medication Sig Dispense Refill  . ALPRAZolam (XANAX) 0.5 MG tablet TAKE 1 TABLET AT BEDTIME 30 tablet 0  . benzonatate (TESSALON) 100 MG capsule TAKE 1 TO 2 CAPSULES BY MOUTH 3 TIMES A DAY AS NEEDED FOR COUGH 60 capsule 2  . divalproex (DEPAKOTE ER) 500 MG 24 hr tablet TAKE 1 TABLET BY MOUTH TWICE A DAY 60 tablet 0  . guaiFENesin (MUCINEX) 600 MG 12 hr tablet Take 1 tablet (600 mg total) by mouth 2 (two) times daily. 60 tablet 0  . nortriptyline (PAMELOR) 50 MG capsule Take 2 capsules (100 mg total) by mouth Nightly. 60 capsule 5  . nystatin (MYCOSTATIN) 100000 UNIT/ML suspension Take 5 mLs (500,000 Units total) by mouth 4 (four) times daily. 60 mL 0  . triamcinolone cream (KENALOG)  0.1 % Apply 1 application topically 2 (two) times daily. 30 g 0   No current facility-administered medications on file prior to visit.     Allergies  Allergen Reactions  . Ceclor [Cefaclor] Hives    Tolerated ceftazidime December 2016  . Escitalopram Oxalate     Pt does not recall ever taking medication  . Sertraline Hcl     Severe Headache  . Sulfa Drugs Cross Reactors Hives and Rash    Hives and rash    Past Medical History:  Diagnosis Date  . Allergy   . Anxiety   . Asthma   . Chronic bronchitis ([place])    "get it alot; maybe not  q yr" (07/06/2014)  . COPD (chronic obstructive pulmonary disease) ([place])   . Depression    psychiatrist Dr. Toy Care  . Diastolic dysfunction   . Emphysematous COPD ([place])    changes in right base along with patchy areas on last x-ray  . GERD (gastroesophageal reflux disease)   . HCAP (healthcare-associated pneumonia) 02/12/2016  . Heart murmur   . History of cardiovascular stress test 08/15/2004   EF of 70% -- Normal stress cardiolite.  There is no evidence of ischemia and there is normal LV function. -- [name]. MD  . History of echocardiogram 12/24/2006   Est. EF of 28-31% NORMAL LV SYSTOLIC FUNCTION WITH IMPAIRED RELAXATION -- MILD AORTIC SCLEROSIS -- NORMAL PALONARY ARTERY PRESSURE -- NO OLD ECHOS FOR COMPARISON -- [name], MD  . Hypertension    essential hypertension  . Necrotic pneumonia ([place])    recurrent/notes 02/12/2016  . Pneumonia    "several times since May 2015" (07/06/2014)  . Tobacco abuse    smoking about 1/2 pk of cigarettes a day   Social History   Social History Narrative   Patient currently lives with her husband.    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 No bird or hot tub exposure. No mold exposure. No recent travel.   Social History  Substance Use Topics  . Smoking status: Current Every Day Smoker    Packs/day: 1.00    Years: 44.00    Types: Cigarettes  . Smokeless tobacco: Never Used     Comment: down to 5-6 cigarettes a day   . Alcohol use 0.6 oz/week    1 Glasses of wine per week   family history includes Asthma in her maternal grandmother; Breast cancer in her maternal aunt; Heart disease in her father; Hyperlipidemia in her father; Hypertension in her father; Stroke in her mother.     Objective:  BP (!) 142/70   Pulse 87   Temp 98 F (36.7 C) (Oral)   Resp 16   Ht 5' 2.99" (1.6 m)   Wt 127 lb (57.6 kg)   SpO2 94%   BMI 22.50 kg/m   Body mass index is 22.5 kg/m.  Physical Exam  Constitutional: She is oriented to person, place, and time and well-developed, well-nourished, and in no distress.  HENT:  Head: Normocephalic and atraumatic.  Right Ear: Hearing and external ear normal.  Left Ear: Hearing and external ear normal.  Eyes: Conjunctivae are normal.  Neck: Normal range of motion.  Cardiovascular: Normal rate, regular rhythm and normal heart sounds.   No murmur heard. Pulmonary/Chest: Effort normal. She has no decreased breath sounds. She has wheezes in the left upper  field, the left middle field and the left lower field. She has no rhonchi. She has no rales.  Musculoskeletal:       Right hip: She exhibits tenderness (lateral hip - no ecchymosis seen -). She exhibits normal range of motion and normal strength.       Lumbar back: She exhibits decreased range of motion, tenderness and spasm. She exhibits no bony tenderness.       Back:       Right lower leg: She exhibits no edema.       Left lower leg: She exhibits no edema.  Neurological: She is alert and oriented to person, place, and time. Gait normal.  Skin: Skin is warm and dry.  Psychiatric: Mood, memory, affect and judgment normal.  Vitals reviewed.  Results for orders placed or performed in visit on 09/04/17  Valproic acid level  Result Value Ref Range   Valproic Acid Lvl 47 (L) 50 - 100 ug/mL  CBC with Differential/Platelet  Result Value Ref Range   WBC 7.6 3.4 - 10.8 x10E3/uL   RBC 4.69 3.77 - 5.28 x10E6/uL   Hemoglobin 13.9 11.1 - 15.9 g/dL   Hematocrit 39.4 34.0 - 46.6 %   MCV 84 79 - 97 fL   MCH 29.6 26.6 - 33.0 pg   MCHC 35.3 31.5 - 35.7 g/dL   RDW 14.4 12.3 - 15.4 %   Platelets 112 (L) 150 - 379 x10E3/uL   Neutrophils 74 Not Estab. %   Lymphs 18 Not Estab. %   Monocytes 6 Not Estab. %   Eos 2 Not Estab. %   Basos 0 Not Estab. %   Neutrophils Absolute 5.5 1.4 - 7.0 x10E3/uL   Lymphocytes Absolute 1.4 0.7 - 3.1 x10E3/uL   Monocytes  Absolute 0.5 0.1 - 0.9 x10E3/uL   EOS (ABSOLUTE) 0.1 0.0 - 0.4 x10E3/uL   Basophils Absolute 0.0 0.0 - 0.2 x10E3/uL   Immature Granulocytes 0 Not Estab. %   Immature Grans (Abs) 0.0 0.0 - 0.1 x10E3/uL  CMP14+EGFR  Result Value Ref Range   Glucose 84 65 - 99 mg/dL   BUN 10 8 - 27 mg/dL   Creatinine, Ser 0.59 0.57 - 1.00 mg/dL   GFR calc non Af Amer 95 >59 mL/min/1.73   GFR calc Af Amer 110 >59 mL/min/1.73   BUN/Creatinine Ratio 17 12 - 28   Sodium 138 134 - 144 mmol/L   Potassium 4.3 3.5 - 5.2 mmol/L   Chloride 101 96 - 106 mmol/L   CO2 21 20 - 29 mmol/L   Calcium 9.0 8.7 - 10.3 mg/dL   Total Protein 7.1 6.0 - 8.5 g/dL   Albumin 4.0 3.6 - 4.8 g/dL   Globulin, Total 3.1 1.5 - 4.5 g/dL   Albumin/Globulin Ratio 1.3 1.2 - 2.2   Bilirubin Total 0.3 0.0 - 1.2 mg/dL   Alkaline Phosphatase 156 (H) 39 - 117 IU/L   AST 24 0 - 40 IU/L   ALT 18 0 - 32 IU/L   Dg Chest 2 View  Result Date: 09/04/2017 CLINICAL DATA:  Cough.  Smoker EXAM: CHEST  2 VIEW COMPARISON:  04/09/2017 FINDINGS: Normal heart size. No pleural effusion or edema. Chronic interstitial coarsening is identified throughout both lungs compatible with emphysema. Scarring, architectural distortion and bronchiectasis within the right lower lobe is identified compatible with postinflammatory/infectious changes. No acute superimposed cardiopulmonary abnormality noted. IMPRESSION: 1. COPD/emphysema. 2. Right lower lobe post infectious and inflammatory changes. Electronically Signed   By: Kerby Moors  M.D.   On: 09/04/2017 14:44   Dg Lumbar Spine 2-3 Views  Result Date: 09/04/2017 CLINICAL DATA:  Back pain.  Fall 1 week ago. EXAM: LUMBAR SPINE - 2-3 VIEW COMPARISON:  04/29/2015 FINDINGS: Anterolisthesis L4 upon L5 is stable. Levoscoliosis at L3-4 is stable. There is severe narrowing of the L3-4, L4-5, and L5-S1 discs. This is unchanged. No vertebral compression deformity. L4 pars defects cannot be excluded. No definite fracture.  IMPRESSION: No acute bony pathology.  Stable chronic changes. Electronically Signed   By: Marybelle Killings M.D.   On: 09/04/2017 14:46    Assessment and Plan :  Mood disorder ([place]) - Plan: Valproic acid level - pt is in acute mania - she is currently taking only 1 pill a day - we will increase her dose to 736m from her current 5068m- we will see if that will allow her mania to cycle less frequently.  Bilateral edema of lower extremity - Plan: spironolactone (ALDACTONE) 25 MG tablet, DISCONTINUED: spironolactone (ALDACTONE) 25 MG tablet - concerned that this may be making her dizzy - will decrease the dose to 2544m we will monitor the swelling though she has none today  Lumbar back pain with radiculopathy affecting right lower extremity - Plan: tiZANidine (ZANAFLEX) 4 MG tablet, HYDROcodone-acetaminophen (NORCO/VICODIN) 5-325 MG tablet, DG Lumbar Spine 2-3 Views - pt has known low back pain - that was aggravated by her fall - she will use the above medications though not together - PT could be helpful for the patient - she has stable chronic back changes seen on xray -   COPD GOLD II/ still smoking - Plan: albuterol (PROVENTIL) (2.5 MG/3ML) 0.083% nebulizer solution, DG Chest 2 View - encouraged patient to stop smoking - continue her current regimen - suggested increased use of albuterol for symptoms control  Cough - Plan: DG Chest 2 View - no PNA  Fall, initial encounter - Plan: DG Lumbar Spine 2-3 Views   Dizzy spells - Plan: CBC with Differential/Platelet, CMP14+EGFR, fluticasone (FLONASE) 50 MCG/ACT nasal spray - ? Cause - pt states that when her allergies are worse this does get worse and when she is on Flonase the dizzy gets better - also concerned when the patient gets depressed she decreases her eating which causes her to have increase dizzy symptoms - she was encouraged to eat -   SarWindell Hummingbird-C  [facility]/05/2017 2:20 PM

## 2017-09-04 NOTE — Patient Instructions (Signed)
     IF you received an x-ray today, you will receive an invoice from [facility]. Please contact [facility] at [phone] with questions or concerns regarding your invoice.   IF you received labwork today, you will receive an invoice from [facility]. Please contact [facility] at [phone] with questions or concerns regarding your invoice.   Our billing staff will not be able to assist you with questions regarding bills from these companies.  You will be contacted with the lab results as soon as they are available. The fastest way to get your results is to activate your My Chart account. Instructions are located on the last page of this paperwork. If you have not heard from us regarding the results in 2 weeks, please contact this office.     

## 2017-09-05 LAB — CBC WITH DIFFERENTIAL/PLATELET
BASOS: 0 %
Basophils Absolute: 0 10*3/uL (ref 0.0–0.2)
EOS (ABSOLUTE): 0.1 10*3/uL (ref 0.0–0.4)
EOS: 2 %
HEMATOCRIT: 39.4 % (ref 34.0–46.6)
HEMOGLOBIN: 13.9 g/dL (ref 11.1–15.9)
IMMATURE GRANULOCYTES: 0 %
Immature Grans (Abs): 0 10*3/uL (ref 0.0–0.1)
LYMPHS ABS: 1.4 10*3/uL (ref 0.7–3.1)
Lymphs: 18 %
MCH: 29.6 pg (ref 26.6–33.0)
MCHC: 35.3 g/dL (ref 31.5–35.7)
MCV: 84 fL (ref 79–97)
MONOCYTES: 6 %
MONOS ABS: 0.5 10*3/uL (ref 0.1–0.9)
Neutrophils Absolute: 5.5 10*3/uL (ref 1.4–7.0)
Neutrophils: 74 %
Platelets: 112 10*3/uL — ABNORMAL LOW (ref 150–379)
RBC: 4.69 x10E6/uL (ref 3.77–5.28)
RDW: 14.4 % (ref 12.3–15.4)
WBC: 7.6 10*3/uL (ref 3.4–10.8)

## 2017-09-05 LAB — CMP14+EGFR
A/G RATIO: 1.3 (ref 1.2–2.2)
ALBUMIN: 4 g/dL (ref 3.6–4.8)
ALT: 18 IU/L (ref 0–32)
AST: 24 IU/L (ref 0–40)
Alkaline Phosphatase: 156 IU/L — ABNORMAL HIGH (ref 39–117)
BUN / CREAT RATIO: 17 (ref 12–28)
BUN: 10 mg/dL (ref 8–27)
Bilirubin Total: 0.3 mg/dL (ref 0.0–1.2)
CALCIUM: 9 mg/dL (ref 8.7–10.3)
CO2: 21 mmol/L (ref 20–29)
Chloride: 101 mmol/L (ref 96–106)
Creatinine, Ser: 0.59 mg/dL (ref 0.57–1.00)
GFR calc Af Amer: 110 mL/min/{1.73_m2} (ref >59)
GFR calc non Af Amer: 95 mL/min/{1.73_m2} (ref >59)
GLOBULIN, TOTAL: 3.1 g/dL (ref 1.5–4.5)
Glucose: 84 mg/dL (ref 65–99)
POTASSIUM: 4.3 mmol/L (ref 3.5–5.2)
SODIUM: 138 mmol/L (ref 134–144)
Total Protein: 7.1 g/dL (ref 6.0–8.5)

## 2017-09-05 LAB — VALPROIC ACID LEVEL: VALPROIC ACID LVL: 47 ug/mL — AB (ref 50–100)

## 2017-09-07 ENCOUNTER — Telehealth: Payer: Self-pay | Admitting: Physician Assistant

## 2017-09-07 NOTE — Telephone Encounter (Signed)
Pt is needing to get her lab results  Best number [phone]

## 2017-09-09 NOTE — Telephone Encounter (Signed)
Please advise 

## 2017-09-10 ENCOUNTER — Telehealth: Payer: Self-pay | Admitting: Physician Assistant

## 2017-09-10 NOTE — Telephone Encounter (Signed)
Pt would like a CB concerning her chest x-ray results from her DOS 09/04/17. Please advise at [phone]

## 2017-09-10 NOTE — Telephone Encounter (Signed)
Actually my mistake I wrote it on her xray results but sent it to the lab pool for them to call her - I checked and it is there.

## 2017-09-10 NOTE — Telephone Encounter (Signed)
Please note that I have written her lab results on the lab results page and her results are waiting to be called to her.  I did this on Tuesday.

## 2017-09-14 NOTE — Telephone Encounter (Signed)
I called pt at [phone] to give her her lab results. I was unable to reach patient nor leave a vm.

## 2017-09-17 ENCOUNTER — Emergency Department (HOSPITAL_COMMUNITY): Payer: Medicare Other

## 2017-09-17 ENCOUNTER — Encounter: Payer: Self-pay | Admitting: Physician Assistant

## 2017-09-17 ENCOUNTER — Ambulatory Visit (INDEPENDENT_AMBULATORY_CARE_PROVIDER_SITE_OTHER): Payer: Medicare Other | Admitting: Physician Assistant

## 2017-09-17 ENCOUNTER — Inpatient Hospital Stay (HOSPITAL_COMMUNITY)
Admission: EM | Admit: 2017-09-17 | Discharge: 2017-09-19 | DRG: 194 | Disposition: A | Discharge status: Home / Self Care | Payer: Medicare Other | Attending: Family Medicine | Admitting: Family Medicine

## 2017-09-17 ENCOUNTER — Ambulatory Visit (INDEPENDENT_AMBULATORY_CARE_PROVIDER_SITE_OTHER): Payer: Medicare Other

## 2017-09-17 ENCOUNTER — Encounter (HOSPITAL_COMMUNITY): Payer: Self-pay

## 2017-09-17 VITALS — BP 136/76 | HR 116 | Temp 98.4°F | Resp 18 | Ht 62.99 in | Wt 125.8 lb

## 2017-09-17 DIAGNOSIS — J449 Chronic obstructive pulmonary disease, unspecified: Secondary | ICD-10-CM | POA: Diagnosis present

## 2017-09-17 DIAGNOSIS — R42 Dizziness and giddiness: Secondary | ICD-10-CM

## 2017-09-17 DIAGNOSIS — R05 Cough: Secondary | ICD-10-CM | POA: Diagnosis not present

## 2017-09-17 DIAGNOSIS — R0602 Shortness of breath: Secondary | ICD-10-CM

## 2017-09-17 DIAGNOSIS — Z7982 Long term (current) use of aspirin: Secondary | ICD-10-CM

## 2017-09-17 DIAGNOSIS — M542 Cervicalgia: Secondary | ICD-10-CM

## 2017-09-17 DIAGNOSIS — R55 Syncope and collapse: Secondary | ICD-10-CM | POA: Diagnosis present

## 2017-09-17 DIAGNOSIS — I10 Essential (primary) hypertension: Secondary | ICD-10-CM | POA: Diagnosis not present

## 2017-09-17 DIAGNOSIS — Z881 Allergy status to other antibiotic agents status: Secondary | ICD-10-CM

## 2017-09-17 DIAGNOSIS — F17210 Nicotine dependence, cigarettes, uncomplicated: Secondary | ICD-10-CM | POA: Diagnosis not present

## 2017-09-17 DIAGNOSIS — J181 Lobar pneumonia, unspecified organism: Secondary | ICD-10-CM

## 2017-09-17 DIAGNOSIS — K219 Gastro-esophageal reflux disease without esophagitis: Secondary | ICD-10-CM | POA: Diagnosis not present

## 2017-09-17 DIAGNOSIS — W19XXXA Unspecified fall, initial encounter: Secondary | ICD-10-CM

## 2017-09-17 DIAGNOSIS — R509 Fever, unspecified: Secondary | ICD-10-CM

## 2017-09-17 DIAGNOSIS — S199XXA Unspecified injury of neck, initial encounter: Secondary | ICD-10-CM | POA: Diagnosis not present

## 2017-09-17 DIAGNOSIS — J189 Pneumonia, unspecified organism: Secondary | ICD-10-CM | POA: Diagnosis not present

## 2017-09-17 DIAGNOSIS — M7918 Myalgia, other site: Secondary | ICD-10-CM | POA: Diagnosis not present

## 2017-09-17 DIAGNOSIS — J470 Bronchiectasis with acute lower respiratory infection: Secondary | ICD-10-CM | POA: Diagnosis present

## 2017-09-17 DIAGNOSIS — M25552 Pain in left hip: Secondary | ICD-10-CM

## 2017-09-17 DIAGNOSIS — T148XXA Other injury of unspecified body region, initial encounter: Secondary | ICD-10-CM | POA: Diagnosis not present

## 2017-09-17 DIAGNOSIS — F419 Anxiety disorder, unspecified: Secondary | ICD-10-CM | POA: Diagnosis not present

## 2017-09-17 DIAGNOSIS — R059 Cough, unspecified: Secondary | ICD-10-CM

## 2017-09-17 DIAGNOSIS — S79912A Unspecified injury of left hip, initial encounter: Secondary | ICD-10-CM | POA: Diagnosis not present

## 2017-09-17 DIAGNOSIS — Z79899 Other long term (current) drug therapy: Secondary | ICD-10-CM

## 2017-09-17 HISTORY — DX: Pneumonia, unspecified organism: J18.9

## 2017-09-17 HISTORY — DX: Other intervertebral disc degeneration, lumbar region: M51.36

## 2017-09-17 HISTORY — DX: Other allergy status, other than to drugs and biological substances: Z91.09

## 2017-09-17 HISTORY — DX: Migraine, unspecified, not intractable, without status migrainosus: G43.909

## 2017-09-17 HISTORY — DX: Unspecified osteoarthritis, unspecified site: M19.90

## 2017-09-17 HISTORY — DX: Repeated falls: R29.6

## 2017-09-17 HISTORY — DX: Allergic rhinitis due to animal (cat) (dog) hair and dander: J30.81

## 2017-09-17 HISTORY — DX: Low back pain, unspecified: M54.50

## 2017-09-17 HISTORY — DX: Other chronic pain: G89.29

## 2017-09-17 HISTORY — DX: Other intervertebral disc degeneration, lumbar region without mention of lumbar back pain or lower extremity pain: M51.369

## 2017-09-17 HISTORY — DX: Low back pain: M54.5

## 2017-09-17 HISTORY — DX: Bipolar disorder, unspecified: F31.9

## 2017-09-17 LAB — COMPREHENSIVE METABOLIC PANEL
ALBUMIN: 3.4 g/dL — AB (ref 3.5–5.0)
ALT: 25 U/L (ref 14–54)
AST: 25 U/L (ref 15–41)
Alkaline Phosphatase: 125 U/L (ref 38–126)
Anion gap: 11 (ref 5–15)
CHLORIDE: 97 mmol/L — AB (ref 101–111)
CO2: 22 mmol/L (ref 22–32)
CREATININE: 0.61 mg/dL (ref 0.44–1.00)
Calcium: 9.1 mg/dL (ref 8.9–10.3)
GFR calc Af Amer: >60 mL/min (ref >60)
GFR calc non Af Amer: >60 mL/min (ref >60)
GLUCOSE: 119 mg/dL — AB (ref 65–99)
Potassium: 3.5 mmol/L (ref 3.5–5.1)
SODIUM: 130 mmol/L — AB (ref 135–145)
Total Bilirubin: 1 mg/dL (ref 0.3–1.2)
Total Protein: 7.6 g/dL (ref 6.5–8.1)

## 2017-09-17 LAB — POCT CBC
Granulocyte percent: 87 %G — AB (ref 37–80)
HCT, POC: 43.6 % (ref 37.7–47.9)
Hemoglobin: 14.5 g/dL (ref 12.2–16.2)
LYMPH, POC: 1.5 (ref 0.6–3.4)
MCH: 29.9 pg (ref 27–31.2)
MCHC: 33.3 g/dL (ref 31.8–35.4)
MCV: 89.7 fL (ref 80–97)
MID (CBC): 0.2 (ref 0–0.9)
MPV: 7.7 fL (ref 0–99.8)
PLATELET COUNT, POC: 143 10*3/uL (ref 142–424)
POC Granulocyte: 11.7 — AB (ref 2–6.9)
POC LYMPH PERCENT: 11.2 %L (ref 10–50)
POC MID %: 1.8 %M (ref 0–12)
RBC: 4.86 M/uL (ref 4.04–5.48)
RDW, POC: 14.3 %
WBC: 13.5 10*3/uL — AB (ref 4.6–10.2)

## 2017-09-17 LAB — CBC WITH DIFFERENTIAL/PLATELET
BASOS ABS: 0 10*3/uL (ref 0.0–0.1)
Basophils Relative: 0 %
EOS PCT: 0 %
Eosinophils Absolute: 0 10*3/uL (ref 0.0–0.7)
HCT: 41.3 % (ref 36.0–46.0)
Hemoglobin: 14.1 g/dL (ref 12.0–15.0)
LYMPHS PCT: 5 %
Lymphs Abs: 0.6 10*3/uL — ABNORMAL LOW (ref 0.7–4.0)
MCH: 30.1 pg (ref 26.0–34.0)
MCHC: 34.1 g/dL (ref 30.0–36.0)
MCV: 88.2 fL (ref 78.0–100.0)
Monocytes Absolute: 0.4 10*3/uL (ref 0.1–1.0)
Monocytes Relative: 4 %
Neutro Abs: 9.6 10*3/uL — ABNORMAL HIGH (ref 1.7–7.7)
Neutrophils Relative %: 91 %
PLATELETS: 107 10*3/uL — AB (ref 150–400)
RBC: 4.68 MIL/uL (ref 3.87–5.11)
RDW: 14.2 % (ref 11.5–15.5)
WBC: 10.5 10*3/uL (ref 4.0–10.5)

## 2017-09-17 LAB — POC INFLUENZA A&B (BINAX/QUICKVUE)
INFLUENZA B, POC: NEGATIVE
Influenza A, POC: NEGATIVE

## 2017-09-17 LAB — I-STAT CG4 LACTIC ACID, ED: Lactic Acid, Venous: 0.66 mmol/L (ref 0.5–1.9)

## 2017-09-17 MED ORDER — ONDANSETRON HCL 4 MG PO TABS: 4.0000 mg | ORAL_TABLET | Freq: Four times a day (QID) | ORAL | Status: DC | PRN

## 2017-09-17 MED ORDER — TIZANIDINE HCL 4 MG PO TABS: 4.0000 mg | ORAL_TABLET | Freq: Three times a day (TID) | ORAL | Status: DC | PRN

## 2017-09-17 MED ORDER — METHYLPREDNISOLONE SODIUM SUCC 125 MG IJ SOLR
125.0000 mg | Freq: Once | INTRAMUSCULAR | Status: AC
  Administered 2017-09-17: 125 mg via INTRAMUSCULAR

## 2017-09-17 MED ORDER — BENZONATATE 100 MG PO CAPS
100.0000 mg | ORAL_CAPSULE | Freq: Three times a day (TID) | ORAL | Status: DC | PRN
  Administered 2017-09-17 – 2017-09-19 (×3): 100 mg via ORAL
  Filled 2017-09-17 (×3): qty 1

## 2017-09-17 MED ORDER — DEXTROSE 5 % IV SOLN
2.0000 g | Freq: Once | INTRAVENOUS | Status: AC
  Administered 2017-09-17: 2 g via INTRAVENOUS
  Filled 2017-09-17: qty 2

## 2017-09-17 MED ORDER — ALBUTEROL SULFATE (2.5 MG/3ML) 0.083% IN NEBU
2.5000 mg | INHALATION_SOLUTION | Freq: Once | RESPIRATORY_TRACT | Status: AC
  Administered 2017-09-17: 2.5 mg via RESPIRATORY_TRACT

## 2017-09-17 MED ORDER — SODIUM CHLORIDE 0.9 % IV BOLUS (SEPSIS)
1000.0000 mL | Freq: Once | INTRAVENOUS | Status: AC
  Administered 2017-09-17: 1000 mL via INTRAVENOUS

## 2017-09-17 MED ORDER — ACETAMINOPHEN 325 MG PO TABS
650.0000 mg | ORAL_TABLET | Freq: Four times a day (QID) | ORAL | Status: DC | PRN
  Administered 2017-09-18 (×2): 650 mg via ORAL
  Filled 2017-09-17 (×2): qty 2

## 2017-09-17 MED ORDER — LEVOFLOXACIN IN D5W 750 MG/150ML IV SOLN
750.0000 mg | INTRAVENOUS | Status: DC
  Administered 2017-09-17 – 2017-09-18 (×2): 750 mg via INTRAVENOUS
  Filled 2017-09-17 (×2): qty 150

## 2017-09-17 MED ORDER — SODIUM CHLORIDE 0.9 % IV BOLUS (SEPSIS)
500.0000 mL | Freq: Once | INTRAVENOUS | Status: AC
  Administered 2017-09-17: 500 mL via INTRAVENOUS

## 2017-09-17 MED ORDER — VANCOMYCIN HCL 500 MG IV SOLR
500.0000 mg | Freq: Two times a day (BID) | INTRAVENOUS | Status: DC
  Filled 2017-09-17: qty 500

## 2017-09-17 MED ORDER — VANCOMYCIN HCL IN DEXTROSE 1-5 GM/200ML-% IV SOLN
1000.0000 mg | Freq: Once | INTRAVENOUS | Status: AC
  Administered 2017-09-17: 1000 mg via INTRAVENOUS
  Filled 2017-09-17: qty 200

## 2017-09-17 MED ORDER — GUAIFENESIN ER 600 MG PO TB12
600.0000 mg | ORAL_TABLET | Freq: Two times a day (BID) | ORAL | Status: DC
  Administered 2017-09-18 – 2017-09-19 (×4): 600 mg via ORAL
  Filled 2017-09-17 (×4): qty 1

## 2017-09-17 MED ORDER — ACETAMINOPHEN 650 MG RE SUPP: 650.0000 mg | Freq: Four times a day (QID) | RECTAL | Status: DC | PRN

## 2017-09-17 MED ORDER — FLUTICASONE PROPIONATE 50 MCG/ACT NA SUSP
2.0000 | Freq: Every day | NASAL | Status: DC
  Administered 2017-09-19: 2 via NASAL
  Filled 2017-09-17: qty 16

## 2017-09-17 MED ORDER — DEXTROSE 5 % IV SOLN
1.0000 g | Freq: Three times a day (TID) | INTRAVENOUS | Status: DC
  Filled 2017-09-17: qty 1

## 2017-09-17 MED ORDER — DEXTROSE 5 % IV SOLN: 2.0000 g | Freq: Once | INTRAVENOUS | Status: DC

## 2017-09-17 MED ORDER — ONDANSETRON HCL 4 MG/2ML IJ SOLN
4.0000 mg | Freq: Four times a day (QID) | INTRAMUSCULAR | Status: DC | PRN
Start: 2017-09-17 — End: 2017-09-19

## 2017-09-17 MED ORDER — SODIUM CHLORIDE 0.9 % IV BOLUS (SEPSIS)
250.0000 mL | Freq: Once | INTRAVENOUS | Status: AC
  Administered 2017-09-17: 250 mL via INTRAVENOUS

## 2017-09-17 MED ORDER — LEVOFLOXACIN 500 MG PO TABS: 500.0000 mg | ORAL_TABLET | Freq: Every day | ORAL | 0 refills | Status: DC

## 2017-09-17 MED ORDER — SPIRONOLACTONE 25 MG PO TABS: 25.0000 mg | ORAL_TABLET | Freq: Every day | ORAL | Status: DC

## 2017-09-17 MED ORDER — DIVALPROEX SODIUM ER 500 MG PO TB24
500.0000 mg | ORAL_TABLET | Freq: Every day | ORAL | Status: DC
  Administered 2017-09-18 – 2017-09-19 (×2): 500 mg via ORAL
  Filled 2017-09-17 (×2): qty 1

## 2017-09-17 MED ORDER — PREDNISONE 10 MG PO TABS: ORAL_TABLET | ORAL | 0 refills | Status: DC

## 2017-09-17 MED ORDER — ENOXAPARIN SODIUM 40 MG/0.4ML ~~LOC~~ SOLN: 40.0000 mg | SUBCUTANEOUS | Status: DC

## 2017-09-17 MED ORDER — NORTRIPTYLINE HCL 25 MG PO CAPS
100.0000 mg | ORAL_CAPSULE | Freq: Every day | ORAL | Status: DC
  Administered 2017-09-18 (×2): 100 mg via ORAL
  Filled 2017-09-17 (×2): qty 4

## 2017-09-17 MED ORDER — SODIUM CHLORIDE 0.9 % IV SOLN: INTRAVENOUS | Status: DC

## 2017-09-17 MED ORDER — IPRATROPIUM BROMIDE 0.02 % IN SOLN
0.5000 mg | Freq: Once | RESPIRATORY_TRACT | Status: AC
Start: 2017-09-17 — End: 2017-09-17
  Administered 2017-09-17: 0.5 mg via RESPIRATORY_TRACT

## 2017-09-17 MED ORDER — HYDROCODONE-ACETAMINOPHEN 5-325 MG PO TABS
0.5000 | ORAL_TABLET | Freq: Three times a day (TID) | ORAL | Status: DC | PRN
  Administered 2017-09-19: 1 via ORAL
  Filled 2017-09-17: qty 1

## 2017-09-17 MED ORDER — ALPRAZOLAM 0.5 MG PO TABS
0.5000 mg | ORAL_TABLET | Freq: Every day | ORAL | Status: DC
  Administered 2017-09-18 (×2): 0.5 mg via ORAL
  Filled 2017-09-17: qty 1
  Filled 2017-09-17: qty 2

## 2017-09-17 NOTE — Progress Notes (Signed)
Catherine Kelley  MRN: [id number] DOB: [birth year]  PCP: Mancel Bale, PA-C  Chief Complaint  Patient presents with  . Fever    temp was 101 last night   . Sore Throat  . Cough    x1week   yellow mucus     Subjective:  Pt presents to clinic for cough with yellow and white phlegm.  She has been sick for a week and it seems to be getting worse.  Her SOB is about what it normally is.  She is using her nebulizer up to 2x/day as well as her tessalon perles bid and they do seem to be helping.  Last night her temp was 101 and she felt terrible with myalgias. She has no energy and not eating well.  She has been using mucinex once a day due to expense but she is supposed to take it bid.  She has been using tylenol for her headache.  History is obtained by patient.  Review of Systems  Constitutional: Positive for chills and fever.  HENT: Positive for congestion.   Respiratory: Positive for cough, shortness of breath (slightly worse than normal) and wheezing.   Musculoskeletal: Positive for myalgias.  Neurological: Positive for headaches.    Patient Active Problem List   Diagnosis Date Noted  . Tobacco abuse 02/12/2016  . Other fatigue 02/04/2016  . Respiratory failure with hypoxia ([place]) 01/09/2016  . Malnutrition of moderate degree 11/03/2015  . Pulmonary emphysema ([place])   . Essential hypertension 09/27/2015  . Rhabdomyolysis 08/16/2015  . Chronic cough 06/07/2015  . Bronchiectasis with acute exacerbation ([place]) 03/07/2015  . Cigarette smoker 12/13/2014  . COPD GOLD II/ still smoking 10/11/2014  . Lumbar back pain with radiculopathy affecting right lower extremity 09/28/2014  . Hyponatremia 07/09/2014  . Hx of recurrent pneumonia 07/08/2014  . Venous insufficiency (chronic) (peripheral) 09/29/2012  . Lower extremity edema 03/09/2012  . Benign hypertensive heart disease without heart failure 05/09/2011  . Chest pain 05/09/2011  . Dyslipidemia 05/09/2011  . Mood disorder ([place])  05/09/2011  . Osteoarthritis 05/09/2011    Current Outpatient Prescriptions on File Prior to Visit  Medication Sig Dispense Refill  . albuterol (PROVENTIL) (2.5 MG/3ML) 0.083% nebulizer solution USE 1 VIAL IN NEBULIZER EVERY 6 HOURS AS NEEDED FOR SHORTNESS OF BREATH 150 mL 0  . ALPRAZolam (XANAX) 0.5 MG tablet TAKE 1 TABLET AT BEDTIME 30 tablet 0  . benzonatate (TESSALON) 100 MG capsule TAKE 1 TO 2 CAPSULES BY MOUTH 3 TIMES A DAY AS NEEDED FOR COUGH 60 capsule 2  . divalproex (DEPAKOTE ER) 500 MG 24 hr tablet TAKE 1 TABLET BY MOUTH TWICE A DAY 60 tablet 0  . fluticasone (FLONASE) 50 MCG/ACT nasal spray Place 2 sprays into both nostrils daily. 16 g 6  . guaiFENesin (MUCINEX) 600 MG 12 hr tablet Take 1 tablet (600 mg total) by mouth 2 (two) times daily. 60 tablet 0  . HYDROcodone-acetaminophen (NORCO/VICODIN) 5-325 MG tablet Take 0.5-1 tablets by mouth every 8 (eight) hours as needed for moderate pain. 15 tablet 0  . nortriptyline (PAMELOR) 50 MG capsule Take 2 capsules (100 mg total) by mouth Nightly. 60 capsule 5  . nystatin (MYCOSTATIN) 100000 UNIT/ML suspension Take 5 mLs (500,000 Units total) by mouth 4 (four) times daily. 60 mL 0  . spironolactone (ALDACTONE) 25 MG tablet Take 1 tablet (25 mg total) by mouth daily. 30 tablet 0  . tiZANidine (ZANAFLEX) 4 MG tablet TAKE 1 TABLET BY MOUTH EVERY 8 HOURS AS  NEEDED FOR MUSCLE SPASMS 45 tablet 2  . triamcinolone cream (KENALOG) 0.1 % Apply 1 application topically 2 (two) times daily. 30 g 0   No current facility-administered medications on file prior to visit.     Allergies  Allergen Reactions  . Ceclor [Cefaclor] Hives    Tolerated ceftazidime December 2016  . Escitalopram Oxalate     Pt does not recall ever taking medication  . Sertraline Hcl     Severe Headache  . Sulfa Drugs Cross Reactors Hives and Rash    Hives and rash    Past Medical History:  Diagnosis Date  . Allergy   . Anxiety   . Asthma   . Chronic bronchitis ([place])      "get it alot; maybe not q yr" (07/06/2014)  . COPD (chronic obstructive pulmonary disease) ([place])   . Depression    psychiatrist Dr. Toy Care  . Diastolic dysfunction   . Emphysematous COPD ([place])    changes in right base along with patchy areas on last x-ray  . GERD (gastroesophageal reflux disease)   . HCAP (healthcare-associated pneumonia) 02/12/2016  . Heart murmur   . History of cardiovascular stress test 08/15/2004   EF of 70% -- Normal stress cardiolite.  There is no evidence of ischemia and there is normal LV function. -- [name]. MD  . History of echocardiogram 12/24/2006   Est. EF of 93-71% NORMAL LV SYSTOLIC FUNCTION WITH IMPAIRED RELAXATION -- MILD AORTIC SCLEROSIS -- NORMAL PALONARY ARTERY PRESSURE -- NO OLD ECHOS FOR COMPARISON -- [name], MD  . Hypertension    essential hypertension  . Necrotic pneumonia ([place])    recurrent/notes 02/12/2016  . Pneumonia    "several times since May 2015" (07/06/2014)  . Tobacco abuse    smoking about 1/2 pk of cigarettes a day   Social History   Social History Narrative   Patient currently lives with her husband.    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 No bird or hot tub exposure. No mold exposure. No recent travel.   Social History  Substance Use Topics  . Smoking status: Current Every Day Smoker    Packs/day: 1.00    Years: 44.00    Types: Cigarettes  . Smokeless tobacco: Never Used     Comment: down to 5-6 cigarettes a day   . Alcohol use 0.6 oz/week    1 Glasses of wine per week   family history includes Asthma in her maternal grandmother; Breast cancer in her maternal aunt; Heart disease in her father; Hyperlipidemia in her father; Hypertension in her father; Stroke in her mother.     Objective:  BP 136/76   Pulse (!) 116   Temp 98.4 F (36.9 C) (Oral)   Resp 18   Ht 5' 2.99" (1.6 m)   Wt 125 lb 12.8 oz (57.1  kg)   SpO2 92%   BMI 22.29 kg/m  Body mass index is 22.29 kg/m.  Physical Exam  Constitutional: She is oriented to person, place, and time and well-developed, well-nourished, and in no distress.  HENT:  Head: Normocephalic and atraumatic.  Right Ear: Hearing, tympanic membrane, external ear and ear canal normal.  Left Ear: Hearing, tympanic membrane, external ear and ear canal normal.  Nose: Nose normal.  Mouth/Throat: Uvula is midline, oropharynx is clear and moist and mucous membranes are normal.  Eyes: Conjunctivae are normal.  Neck: Normal range of motion.  Cardiovascular: Normal rate, regular rhythm and normal heart sounds.   No murmur heard. Pulmonary/Chest: Effort normal. She has wheezes (inspiratory and expirtory wheezing - with slightly improvement with albuterol).  Neurological: She is alert and oriented to person, place, and time. Gait normal.  Skin: Skin is warm and dry.  Psychiatric: Mood, memory, affect and judgment normal.  Vitals reviewed.  Results for orders placed or performed in visit on 09/17/17  POCT CBC  Result Value Ref Range   WBC 13.5 (A) 4.6 - 10.2 K/uL   Lymph, poc 1.5 0.6 - 3.4   POC LYMPH PERCENT 11.2 10 - 50 %L   MID (cbc) 0.2 0 - 0.9   POC MID % 1.8 0 - 12 %M   POC Granulocyte 11.7 (A) 2 - 6.9   Granulocyte percent 87.0 (A) 37 - 80 %G   RBC 4.86 4.04 - 5.48 M/uL   Hemoglobin 14.5 12.2 - 16.2 g/dL   HCT, POC 43.6 37.7 - 47.9 %   MCV 89.7 80 - 97 fL   MCH, POC 29.9 27 - 31.2 pg   MCHC 33.3 31.8 - 35.4 g/dL   RDW, POC 14.3 %   Platelet Count, POC 143 142 - 424 K/uL   MPV 7.7 0 - 99.8 fL  POC Influenza A&B(BINAX/QUICKVUE)  Result Value Ref Range   Influenza A, POC Negative Negative   Influenza B, POC Negative Negative   Dg Chest 2 View  Result Date: 09/17/2017 CLINICAL DATA:  Short of breath, cough, history of COPD EXAM: CHEST  2 VIEW COMPARISON:  Chest x-ray of 09/04/2017 and 04/09/2017 FINDINGS: Although there are changes of pulmonary  fibrosis present, there is more opacity in the right mid lung and right lung base suspicious for superimposed pneumonia. No pleural effusion is seen. There also is some peribronchial thickening most consistent with bronchitis. Mediastinal and hilar contours are unremarkable. The heart is within normal limits in size. No acute bony abnormality is seen. IMPRESSION: 1. Suspect patchy foci of pneumonia in the right mid lung and right lung base superimposed upon chronic fibrotic change. 2. Peribronchial thickening may indicate bronchitis. Electronically Signed   By: Ivar Drape M.D.   On: 09/17/2017 16:54    Assessment and Plan :  Cough - Plan: POCT CBC, albuterol (PROVENTIL) (2.5 MG/3ML) 0.083% nebulizer solution 2.5 mg, DG Chest 2 View, POC Influenza A&B(BINAX/QUICKVUE)  Shortness of breath - Plan: albuterol (PROVENTIL) (2.5 MG/3ML) 0.083% nebulizer solution 2.5 mg, ipratropium (ATROVENT) nebulizer solution 0.5 mg, DG Chest 2 View, methylPREDNISolone sodium succinate (SOLU-MEDROL) 125 mg/2 mL injection 125 mg, predniSONE (DELTASONE) 10 MG tablet  COPD GOLD II/ still smoking  Fever, unspecified - Plan: POC Influenza A&B(BINAX/QUICKVUE)  Pneumonia of right middle lobe due to infectious organism ([place]) - Plan: levofloxacin (LEVAQUIN) 500 MG tablet   Pt has known COPD and continues to smoke with current pneumonia.  Her pulse ox typically runs about 94% so it is lower than normal with her current PNA.  She will increase her albuterol use and we will start her on abx and prednisone.  If she is worse tomorrow she will RTC for likely hospital admission.  If she is worsens over the weekend she will go to the ED.  Her daughter came and picked her up because she was unsteady after her albuterol treatments.  D/w Dr Linna Darner.  Windell Hummingbird PA-C  [facility] 09/17/2017 5:19 PM

## 2017-09-17 NOTE — Patient Instructions (Addendum)
     IF you received an x-ray today, you will receive an invoice from [facility]. Please contact [facility] at [phone] with questions or concerns regarding your invoice.   IF you received labwork today, you will receive an invoice from [facility]. Please contact [facility] at [phone] with questions or concerns regarding your invoice.   Our billing staff will not be able to assist you with questions regarding bills from these companies.  You will be contacted with the lab results as soon as they are available. The fastest way to get your results is to activate your My Chart account. Instructions are located on the last page of this paperwork. If you have not heard from [name] regarding the results in 2 weeks, please contact this office.    Levaquin daily - start tonight!! Increase albuterol nebulizer to every 6 hours. Start prednisone tomorrow am because we gave you a shot today of prednisone. Increase mucinex 2x/day.  See me tomorrow if you are worse. See me Tuesday if you are not better at that time. If over the weekend you start to have problems you must go to the ED.

## 2017-09-17 NOTE — Progress Notes (Signed)
Pharmacy Antibiotic Note  Catherine Kelley is a 66 y.o. female admitted on 09/17/2017 with pneumonia.  Pharmacy has been consulted for vancomycin and cefepime dosing. Temp 101.8, WBC 13.5, Scr 0.61. CXR pneumonia in right lung.   Plan: Vancomycin 1000mg  IV X1 then vancomycin 500mg  IV every 12 hours Cefepime 2g IV X1, then cefepime 1g IV every 8 hours. Patient has documented allergy to cefaclor- has tolerated ceftriaxone and ceftazidime in the past, and provider OK with cefepime.  Monitor for Scr, c/s, clinical resolution. F/u de-escalation plan/LOT, vancomycin trough as indicated    Temp (24hrs), Avg:100.1 F (37.8 C), Min:98.4 F (36.9 C), Max:101.8 F (38.8 C)   Recent Labs Lab 09/17/17 1558 09/17/17 1958  WBC 13.5* 10.5    Estimated Creatinine Clearance: 56.4 mL/min (by C-G formula based on SCr of 0.59 mg/dL).    Allergies  Allergen Reactions  . Ceclor [Cefaclor] Hives    Tolerated ceftazidime December 2016  . Escitalopram Oxalate     Pt does not recall ever taking medication  . Sertraline Hcl     Severe Headache  . Sulfa Drugs Cross Reactors Hives and Rash    Hives and rash    Antimicrobials this admission: 10/18 vancomycin >>  10/18 cefepime >>   Thank you for allowing pharmacy to be a part of this patient's care.  Jerrye Noble, PharmD Candidate  09/17/2017 8:59 PM

## 2017-09-17 NOTE — ED Triage Notes (Signed)
Patient coming from PCP office where she fell.  Complaining of right neck pain and left hip pain.  Denies any LOC, no bleeding noticed.  Pluses WNL in feet. A&Ox4

## 2017-09-17 NOTE — Progress Notes (Addendum)
Called to waiting room emergently. Patient apparently had gotten up from chair and was staying at front desk, when she became unsteady and fell to the floor. No apparent loss of consciousness, no visualized seizure activity.   When I presented to waiting room with other staff members she was lying on floor supine, with left knee slightly bent. She responded appropriately to commands and was alert. She had been lightheaded or dizzy then fell. Did note urinary incontinence, which she said she has had in the past, denied any history of seizures or seizure activity.   Vitals immediately obtained with blood pressure 147/64, O2 sat 90-91% on room air. Denied any chest pain or acute dyspnea other than for previous reasons treated earlier in day. She did state that she had been having lightheadedness and dizziness a past few days, and headache the past few days. Noted that she was treated earlier with DuoNeb, Solu-Medrol. Chest x-ray with pneumonia likely on chronic changes. Heart was regular rhythm, lung sounds were heard bilaterally.  Still with some headache, but had been having headache previous to fall, no focal tenderness along scalp.. Did note some right-sided neck pain, slightly tender on lower cervical spine, slightly to the right of midline as well as midline.  Mid and lower back nontender, but did complain of some pain at her buttocks/tailbone.  Did localize some pain to left hip with movement of left leg, as well as with pelvic rock gently. CBG was 112, previous hemoglobin noted at 14.5. Remained 90-92% on room air, oxygen was not given.   EMS called for transport at approximately 1756, IV was attempted to the left [facility] which subsequently infiltrated and was removed. C-spine was held in midline until EMS arrived, patient was left in same position as she was found until EMS arrived. EMS evaluation with transfer of care at 1814: placed in cervical collar, but did not deem necessary for spine board. She was  assisted to sitting position then assisted to a stretcher with strength intact in her lower extremities. Slight discomfort in left hip with standing. Transported by EMS to [facility] , nursing staff advised approximately [phone].   Results for orders placed or performed in visit on 09/17/17  POCT CBC  Result Value Ref Range   WBC 13.5 (A) 4.6 - 10.2 K/uL   Lymph, poc 1.5 0.6 - 3.4   POC LYMPH PERCENT 11.2 10 - 50 %L   MID (cbc) 0.2 0 - 0.9   POC MID % 1.8 0 - 12 %M   POC Granulocyte 11.7 (A) 2 - 6.9   Granulocyte percent 87.0 (A) 37 - 80 %G   RBC 4.86 4.04 - 5.48 M/uL   Hemoglobin 14.5 12.2 - 16.2 g/dL   HCT, POC 43.6 37.7 - 47.9 %   MCV 89.7 80 - 97 fL   MCH, POC 29.9 27 - 31.2 pg   MCHC 33.3 31.8 - 35.4 g/dL   RDW, POC 14.3 %   Platelet Count, POC 143 142 - 424 K/uL   MPV 7.7 0 - 99.8 fL  POC Influenza A&B(BINAX/QUICKVUE)  Result Value Ref Range   Influenza A, POC Negative Negative   Influenza B, POC Negative Negative   Signed,   Merri Ray, MD [facility].  09/17/17 7:19 PM

## 2017-09-17 NOTE — ED Provider Notes (Signed)
[facility] EMERGENCY DEPARTMENT Provider Note   CSN: [id number] Arrival date & time: 09/17/17  1850     History   Chief Complaint Chief Complaint  Patient presents with  . Fall    HPI Catherine Kelley is a 66 y.o. female.  HPI Patient presented to the emergency room for evaluation of left hip pain after a fall.  Patient has been having trouble with a persistent cough.she has had fevers as well with a temperature up to 101 last night. Patient does have a history of COPD and she continues to smoke. Patient went to her primary care doctor's office today.she had an outpatient chest x-ray that showed a patchy focus of pneumonia in the right midlung and right lung base.  Patient was leaving her doctor's office when she became lightheaded and weak and fell. Patient landed on her left hip. Patient denies any head injury or loss of consciousness. She is complaining of pain in her left hip has some pain right side of her neck. Currently she is not having any chest pain. She denies any shortness of breath. No numbness or weakness. No abdominal pain nausea or vomiting. Past Medical History:  Diagnosis Date  . Allergy   . Anxiety   . Asthma   . Chronic bronchitis ([place])    "get it alot; maybe not q yr" (07/06/2014)  . COPD (chronic obstructive pulmonary disease) ([place])   . Depression    psychiatrist Dr. Toy Care  . Diastolic dysfunction   . Emphysematous COPD ([place])    changes in right base along with patchy areas on last x-ray  . GERD (gastroesophageal reflux disease)   . HCAP (healthcare-associated pneumonia) 02/12/2016  . Heart murmur   . History of cardiovascular stress test 08/15/2004   EF of 70% -- Normal stress cardiolite.  There is no evidence of ischemia and there is normal LV function. -- [name]. MD  . History of echocardiogram 12/24/2006   Est. EF of 41-96% NORMAL LV SYSTOLIC FUNCTION WITH IMPAIRED RELAXATION -- MILD AORTIC SCLEROSIS -- NORMAL PALONARY ARTERY  PRESSURE -- NO OLD ECHOS FOR COMPARISON -- [name], MD  . Hypertension    essential hypertension  . Necrotic pneumonia ([place])    recurrent/notes 02/12/2016  . Pneumonia    "several times since May 2015" (07/06/2014)  . Tobacco abuse    smoking about 1/2 pk of cigarettes a day    Patient Active Problem List   Diagnosis Date Noted  . Tobacco abuse 02/12/2016  . Other fatigue 02/04/2016  . Respiratory failure with hypoxia ([place]) 01/09/2016  . Malnutrition of moderate degree 11/03/2015  . Pulmonary emphysema ([place])   . Essential hypertension 09/27/2015  . Rhabdomyolysis 08/16/2015  . Chronic cough 06/07/2015  . Bronchiectasis with acute exacerbation ([place]) 03/07/2015  . Cigarette smoker 12/13/2014  . COPD GOLD II/ still smoking 10/11/2014  . Lumbar back pain with radiculopathy affecting right lower extremity 09/28/2014  . Hyponatremia 07/09/2014  . Hx of recurrent pneumonia 07/08/2014  . Venous insufficiency (chronic) (peripheral) 09/29/2012  . Lower extremity edema 03/09/2012  . Benign hypertensive heart disease without heart failure 05/09/2011  . Chest pain 05/09/2011  . Dyslipidemia 05/09/2011  . Mood disorder ([place]) 05/09/2011  . Osteoarthritis 05/09/2011    Past Surgical History:  Procedure Laterality Date  . DILATION AND CURETTAGE OF UTERUS    . TUBAL LIGATION  1986    OB History    No data available       Home  Medications    Prior to Admission medications   Medication Sig Start Date End Date Taking? Authorizing Provider  albuterol (PROVENTIL) (2.5 MG/3ML) 0.083% nebulizer solution USE 1 VIAL IN NEBULIZER EVERY 6 HOURS AS NEEDED FOR SHORTNESS OF BREATH 09/04/17  Yes Weber, Damaris Hippo, PA-C  ALPRAZolam Duanne Moron) 0.5 MG tablet TAKE 1 TABLET AT BEDTIME Patient taking differently: Take 0.5 mg by mouth at bedtime 08/21/17  Yes Weber, Damaris Hippo, PA-C  aspirin EC 81 MG tablet Take 81-162 mg by mouth See admin instructions. 81 mg once a day "for heart health" and 162 mg one  to two times a day as needed for headaches   Yes [provider]  benzonatate (TESSALON) 100 MG capsule TAKE 1 TO 2 CAPSULES BY MOUTH 3 TIMES A DAY AS NEEDED FOR COUGH Patient taking differently: Take 100-200 mg by mouth three times a day as needed for coughing 03/29/17  Yes Weber, Sarah L, PA-C  calcium carbonate (TUMS EX) 750 MG chewable tablet Chew 1 tablet by mouth 2 (two) times daily as needed for heartburn.   Yes [provider]  divalproex (DEPAKOTE ER) 500 MG 24 hr tablet TAKE 1 TABLET BY MOUTH TWICE A DAY Patient taking differently: Take 500 mg by mouth at bedtime 08/13/17  Yes Weber, Sarah L, PA-C  fluticasone (FLONASE) 50 MCG/ACT nasal spray Place 2 sprays into both nostrils daily. 09/04/17  Yes Weber, Damaris Hippo, PA-C  guaiFENesin (MUCINEX) 600 MG 12 hr tablet Take 1 tablet (600 mg total) by mouth 2 (two) times daily. Patient taking differently: Take 600 mg by mouth every 12 (twelve) hours.  11/08/15  Yes Reyne Dumas, MD  HYDROcodone-acetaminophen (NORCO/VICODIN) 5-325 MG tablet Take 0.5-1 tablets by mouth every 8 (eight) hours as needed for moderate pain. 09/04/17  Yes Weber, Sarah L, PA-C  nortriptyline (PAMELOR) 50 MG capsule Take 2 capsules (100 mg total) by mouth Nightly. Patient taking differently: Take 100 mg by mouth at bedtime.  05/26/17  Yes Weber, Damaris Hippo, PA-C  spironolactone (ALDACTONE) 25 MG tablet Take 1 tablet (25 mg total) by mouth daily. 09/04/17  Yes Weber, Sarah L, PA-C  tiZANidine (ZANAFLEX) 4 MG tablet TAKE 1 TABLET BY MOUTH EVERY 8 HOURS AS NEEDED FOR MUSCLE SPASMS Patient taking differently: Take 4 mg by mouth every 8 (eight) hours as needed for muscle spasms.  09/04/17  Yes Weber, Sarah L, PA-C  triamcinolone cream (KENALOG) 0.1 % Apply 1 application topically 2 (two) times daily. Patient taking differently: Apply 1 application topically 2 (two) times daily as needed (for irritation).  02/07/17  Yes Forrest Moron, MD  levofloxacin (LEVAQUIN) 500 MG  tablet Take 1 tablet (500 mg total) by mouth daily. Patient taking differently: Take 500 mg by mouth daily. FOR 7 DAYS 09/17/17   Gale Journey, Damaris Hippo, PA-C  nystatin (MYCOSTATIN) 100000 UNIT/ML suspension Take 5 mLs (500,000 Units total) by mouth 4 (four) times daily. Patient not taking: Reported on 09/17/2017 07/24/17   Mancel Bale, PA-C  predniSONE (DELTASONE) 10 MG tablet 6-1 taper - Start with 6 pills on the 1st day and decreased each day by one pill.  Take pills for that day all together in the am with food. Patient not taking: Reported on 09/17/2017 09/17/17 09/23/17  Mancel Bale, PA-C    Family History Family History  Problem Relation Age of Onset  . Stroke Mother   . Heart disease Father   . Hyperlipidemia Father   . Hypertension Father   . Breast  cancer Maternal Aunt   . Asthma Maternal Grandmother     Social History Social History  Substance Use Topics  . Smoking status: Current Every Day Smoker    Packs/day: 1.00    Years: 44.00    Types: Cigarettes  . Smokeless tobacco: Never Used     Comment: down to 5-6 cigarettes a day   . Alcohol use 0.6 oz/week    1 Glasses of wine per week     Allergies   Sulfa drugs cross reactors; Ceclor [cefaclor]; Depakote er [divalproex sodium er]; Escitalopram oxalate; and Sertraline hcl   Review of Systems Review of Systems  All other systems reviewed and are negative.    Physical Exam Updated Vital Signs BP 126/62   Pulse 87   Temp (!) 101.8 F (38.8 C) (Rectal)   Resp (!) 24   SpO2 92%   Physical Exam  Constitutional: No distress.  HENT:  Head: Normocephalic and atraumatic.  Right Ear: External ear normal.  Left Ear: External ear normal.  Eyes: Conjunctivae are normal. Right eye exhibits no discharge. Left eye exhibits no discharge. No scleral icterus.  Neck: Neck supple. No tracheal deviation present.  Cardiovascular: Normal rate, regular rhythm and intact distal pulses.   Pulmonary/Chest: Effort normal and  breath sounds normal. No stridor. No respiratory distress. She has no wheezes. She has no rales.  Abdominal: Soft. Bowel sounds are normal. She exhibits no distension. There is no tenderness. There is no rebound and no guarding.  Musculoskeletal: She exhibits no edema.       Left hip: She exhibits tenderness and bony tenderness.       Cervical back: She exhibits tenderness. She exhibits no bony tenderness.  No shortening of the extrem  Neurological: She is alert. She has normal strength. No cranial nerve deficit (no facial droop, extraocular movements intact, no slurred speech) or sensory deficit. She exhibits normal muscle tone. She displays no seizure activity. Coordination normal.  Skin: Skin is warm. No rash noted. She is diaphoretic.  Psychiatric: She has a normal mood and affect.  Nursing note and vitals reviewed.    ED Treatments / Results  Labs (all labs ordered are listed, but only abnormal results are displayed) Labs Reviewed  CBC WITH DIFFERENTIAL/PLATELET - Abnormal; Notable for the following:       Result Value   Platelets 107 (*)    Neutro Abs 9.6 (*)    Lymphs Abs 0.6 (*)    All other components within normal limits  COMPREHENSIVE METABOLIC PANEL - Abnormal; Notable for the following:    Sodium 130 (*)    Chloride 97 (*)    Glucose, Bld 119 (*)    BUN <5 (*)    Albumin 3.4 (*)    All other components within normal limits  I-STAT CG4 LACTIC ACID, ED    Radiology Dg Chest 2 View  Result Date: 09/17/2017 CLINICAL DATA:  Short of breath, cough, history of COPD EXAM: CHEST  2 VIEW COMPARISON:  Chest x-ray of 09/04/2017 and 04/09/2017 FINDINGS: Although there are changes of pulmonary fibrosis present, there is more opacity in the right mid lung and right lung base suspicious for superimposed pneumonia. No pleural effusion is seen. There also is some peribronchial thickening most consistent with bronchitis. Mediastinal and hilar contours are unremarkable. The heart is  within normal limits in size. No acute bony abnormality is seen. IMPRESSION: 1. Suspect patchy foci of pneumonia in the right mid lung and right lung base superimposed upon  chronic fibrotic change. 2. Peribronchial thickening may indicate bronchitis. Electronically Signed   By: Ivar Drape M.D.   On: 09/17/2017 16:54   Ct Cervical Spine Wo Contrast  Result Date: 09/17/2017 CLINICAL DATA:  Right neck pain after fall today. No loss consciousness. EXAM: CT CERVICAL SPINE WITHOUT CONTRAST TECHNIQUE: Multidetector CT imaging of the cervical spine was performed without intravenous contrast. Multiplanar CT image reconstructions were also generated. COMPARISON:  None. FINDINGS: Alignment: Normal cervical lordosis. Intact craniocervical relationship. Osteoarthritis the atlantodental interval. Skull base and vertebrae: No skull fracture. Vertebral bodies and posterior elements are intact. Soft tissues and spinal canal: No prevertebral fluid or swelling. No visible canal hematoma. Disc levels: Small central disc herniations C2-3, C3-4 and C4-5 with broad-based disc bulging at C5-6 at C6-7. Mild disc space narrowing at C5-6 and C6-7. Minimal anterolisthesis grade 1 of C7 on T1. Mild bilateral C5-6 and right greater than left C6-7 uncovertebral osteoarthritis. No jumped or perched facets. Mild facet arthropathy C7-T1 bilaterally with joint space narrowing and sclerosis. Upper chest: Centrilobular and paraseptal emphysema. Other: Moderate extracranial carotid arteriosclerosis. IMPRESSION: 1. Small central disc herniations C2 through C5 with disc bulges at C5-6 and C6-7. 2. No acute cervical spine fracture. 3. Minimal grade 1 anterolisthesis of C7 on T1. 4. Bilateral C7-T1 facet arthropathy. 5. Upper lobe emphysema. Electronically Signed   By: Ashley Royalty M.D.   On: 09/17/2017 21:17   Dg Hip Unilat With Pelvis 2-3 Views Left  Result Date: 09/17/2017 CLINICAL DATA:  Posterior left hip pain after a fall. EXAM: DG HIP (WITH  OR WITHOUT PELVIS) 2-3V LEFT COMPARISON:  None. FINDINGS: There is no evidence of hip fracture or dislocation. There is no evidence of arthropathy or other focal bone abnormality. IMPRESSION: Negative. Electronically Signed   By: Misty Stanley M.D.   On: 09/17/2017 20:25    Procedures Procedures (including critical care time)  Medications Ordered in ED Medications  vancomycin (VANCOCIN) IVPB 1000 mg/200 mL premix (1,000 mg Intravenous New Bag/Given 09/17/17 2204)  ceFEPIme (MAXIPIME) 1 g in dextrose 5 % 50 mL IVPB (not administered)  vancomycin (VANCOCIN) 500 mg in sodium chloride 0.9 % 100 mL IVPB (not administered)  sodium chloride 0.9 % bolus 1,000 mL (0 mLs Intravenous Stopped 09/17/17 2041)  sodium chloride 0.9 % bolus 1,000 mL (0 mLs Intravenous Stopped 09/17/17 2237)    And  sodium chloride 0.9 % bolus 500 mL (0 mLs Intravenous Stopped 09/17/17 2237)    And  sodium chloride 0.9 % bolus 250 mL (0 mLs Intravenous Stopped 09/17/17 2204)  ceFEPIme (MAXIPIME) 2 g in dextrose 5 % 50 mL IVPB (0 g Intravenous Stopped 09/17/17 2237)     Initial Impression / Assessment and Plan / ED Course  I have reviewed the triage vital signs and the nursing notes.  Pertinent labs & imaging results that were available during my care of the patient were reviewed by me and considered in my medical decision making (see chart for details).   patient presented to the emergency room for evaluation after a fall after being seen in her doctor's office for trouble with cough and congestion. Patient had an outpatient x-ray that was consistent with pneumonia. She states she felt weak and fell to the ground as she was walking outside of the office. Patient has no serious injuries associated with her fall. He is febrile and had a near syncopal event. No signs of severe sepsis.  Remains hemodynamically stable. The patient warrants inpatient admission, IV antibiotics and  further treatment.  Final Clinical Impressions(s)  / ED Diagnoses   Final diagnoses:  Community acquired pneumonia, unspecified laterality  Near syncope     Dorie Rank, MD 09/17/17 2244

## 2017-09-17 NOTE — H&P (Signed)
History and Physical    Catherine Kelley QVZ:[id number] DOB: February 05, 1951 DOA: 09/17/2017  PCP: Mancel Bale, PA-C  Patient coming from: Home.  Chief Complaint: Fever and cough.  HPI: Catherine Kelley is a 66 y.o. female with history of COPD, bronchiectasis with ongoing tobacco abuse, hypertension had gone to her PCP for fever and cough.  Patient states over the last 1 week patient has been having sore throat-like feeling with fever chills and productive cough.  At the PCPs office patient had chest x-ray and was diagnosed with pneumonia and was given Levaquin.  After the visit patient was on the way back home when patient suddenly felt dizzy and fell.  Did not lose consciousness or did not have any seizure-like activities.  Patient was referred to the ER.  Patient states over the last few days patient has been feeling dizzy on standing.  ED Course: In the ER patient had a CT of the C-spine since patient complained of some neck pain which was unremarkable.  X-ray of the hip was negative.  Troponin was negative.  EKG pending.  Patient on exam appears nonfocal.  Patient will be admitted for further observation for pneumonia and near syncope.  Review of Systems: As per HPI, rest all negative.   Past Medical History:  Diagnosis Date  . Allergy   . Anxiety   . Asthma   . Chronic bronchitis ([place])    "get it alot; maybe not q yr" (07/06/2014)  . COPD (chronic obstructive pulmonary disease) ([place])   . Depression    psychiatrist Dr. Toy Care  . Diastolic dysfunction   . Emphysematous COPD ([place])    changes in right base along with patchy areas on last x-ray  . GERD (gastroesophageal reflux disease)   . HCAP (healthcare-associated pneumonia) 02/12/2016  . Heart murmur   . History of cardiovascular stress test 08/15/2004   EF of 70% -- Normal stress cardiolite.  There is no evidence of ischemia and there is normal LV function. -- [name]. MD  . History of echocardiogram 12/24/2006   Est. EF of  32-95% NORMAL LV SYSTOLIC FUNCTION WITH IMPAIRED RELAXATION -- MILD AORTIC SCLEROSIS -- NORMAL PALONARY ARTERY PRESSURE -- NO OLD ECHOS FOR COMPARISON -- [name], MD  . Hypertension    essential hypertension  . Necrotic pneumonia ([place])    recurrent/notes 02/12/2016  . Pneumonia    "several times since May 2015" (07/06/2014)  . Tobacco abuse    smoking about 1/2 pk of cigarettes a day    Past Surgical History:  Procedure Laterality Date  . DILATION AND CURETTAGE OF UTERUS    . TUBAL LIGATION  1986     reports that she has been smoking Cigarettes.  She has a 44.00 pack-year smoking history. She has never used smokeless tobacco. She reports that she drinks about 0.6 oz of alcohol per week . She reports that she does not use drugs.  Allergies  Allergen Reactions  . Sulfa Drugs Cross Reactors Hives and Rash    Hives and rash  . Ceclor [Cefaclor] Hives    Tolerated ceftazidime December 2016  . Depakote Er [Divalproex Sodium Er] Other (See Comments)    Patient remarked that when she was taking this TWICE a day, she became "clumsy" and felt more prone to stumbling  . Escitalopram Oxalate Other (See Comments)    Pt does not recall ever taking medication (??)  . Sertraline Hcl     Severe headache  Family History  Problem Relation Age of Onset  . Stroke Mother   . Heart disease Father   . Hyperlipidemia Father   . Hypertension Father   . Breast cancer Maternal Aunt   . Asthma Maternal Grandmother     Prior to Admission medications   Medication Sig Start Date End Date Taking? Authorizing Provider  albuterol (PROVENTIL) (2.5 MG/3ML) 0.083% nebulizer solution USE 1 VIAL IN NEBULIZER EVERY 6 HOURS AS NEEDED FOR SHORTNESS OF BREATH 09/04/17  Yes Weber, Damaris Hippo, PA-C  ALPRAZolam Duanne Moron) 0.5 MG tablet TAKE 1 TABLET AT BEDTIME Patient taking differently: Take 0.5 mg by mouth at bedtime 08/21/17  Yes Weber, Damaris Hippo, PA-C  aspirin EC 81 MG tablet Take 81-162 mg by mouth See admin  instructions. 81 mg once a day "for heart health" and 162 mg one to two times a day as needed for headaches   Yes [provider]  benzonatate (TESSALON) 100 MG capsule TAKE 1 TO 2 CAPSULES BY MOUTH 3 TIMES A DAY AS NEEDED FOR COUGH Patient taking differently: Take 100-200 mg by mouth three times a day as needed for coughing 03/29/17  Yes Weber, Sarah L, PA-C  calcium carbonate (TUMS EX) 750 MG chewable tablet Chew 1 tablet by mouth 2 (two) times daily as needed for heartburn.   Yes [provider]  divalproex (DEPAKOTE ER) 500 MG 24 hr tablet TAKE 1 TABLET BY MOUTH TWICE A DAY Patient taking differently: Take 500 mg by mouth at bedtime 08/13/17  Yes Weber, Sarah L, PA-C  fluticasone (FLONASE) 50 MCG/ACT nasal spray Place 2 sprays into both nostrils daily. 09/04/17  Yes Weber, Damaris Hippo, PA-C  guaiFENesin (MUCINEX) 600 MG 12 hr tablet Take 1 tablet (600 mg total) by mouth 2 (two) times daily. Patient taking differently: Take 600 mg by mouth every 12 (twelve) hours.  11/08/15  Yes Reyne Dumas, MD  HYDROcodone-acetaminophen (NORCO/VICODIN) 5-325 MG tablet Take 0.5-1 tablets by mouth every 8 (eight) hours as needed for moderate pain. 09/04/17  Yes Weber, Sarah L, PA-C  nortriptyline (PAMELOR) 50 MG capsule Take 2 capsules (100 mg total) by mouth Nightly. Patient taking differently: Take 100 mg by mouth at bedtime.  05/26/17  Yes Weber, Damaris Hippo, PA-C  spironolactone (ALDACTONE) 25 MG tablet Take 1 tablet (25 mg total) by mouth daily. 09/04/17  Yes Weber, Sarah L, PA-C  tiZANidine (ZANAFLEX) 4 MG tablet TAKE 1 TABLET BY MOUTH EVERY 8 HOURS AS NEEDED FOR MUSCLE SPASMS Patient taking differently: Take 4 mg by mouth every 8 (eight) hours as needed for muscle spasms.  09/04/17  Yes Weber, Sarah L, PA-C  triamcinolone cream (KENALOG) 0.1 % Apply 1 application topically 2 (two) times daily. Patient taking differently: Apply 1 application topically 2 (two) times daily as needed (for irritation).   02/07/17  Yes Forrest Moron, MD  levofloxacin (LEVAQUIN) 500 MG tablet Take 1 tablet (500 mg total) by mouth daily. Patient taking differently: Take 500 mg by mouth daily. FOR 7 DAYS 09/17/17   Gale Journey, Damaris Hippo, PA-C  nystatin (MYCOSTATIN) 100000 UNIT/ML suspension Take 5 mLs (500,000 Units total) by mouth 4 (four) times daily. Patient not taking: Reported on 09/17/2017 07/24/17   Mancel Bale, PA-C  predniSONE (DELTASONE) 10 MG tablet 6-1 taper - Start with 6 pills on the 1st day and decreased each day by one pill.  Take pills for that day all together in the am with food. Patient not taking: Reported on 09/17/2017 09/17/17 09/23/17  Mancel Bale, PA-C    Physical Exam: Vitals:   09/17/17 2000 09/17/17 2030 09/17/17 2038 09/17/17 2200  BP: 129/62 (!) 124/54  126/62  Pulse: 100 93  87  Resp: (!) 25 (!) 21  (!) 24  Temp:   (!) 101.8 F (38.8 C)   TempSrc:   Rectal   SpO2: (!) 89% (!) 88%  92%      Constitutional: Moderately built and nourished. Vitals:   09/17/17 2000 09/17/17 2030 09/17/17 2038 09/17/17 2200  BP: 129/62 (!) 124/54  126/62  Pulse: 100 93  87  Resp: (!) 25 (!) 21  (!) 24  Temp:   (!) 101.8 F (38.8 C)   TempSrc:   Rectal   SpO2: (!) 89% (!) 88%  92%   Eyes: Anicteric with no pallor. ENMT: No discharge from the ears eyes nose and mouth. Neck: No mass felt.  No JVD appreciated. Respiratory: No rhonchi or crepitations. Cardiovascular: S1-S2 heard no murmurs appreciated. Abdomen: Soft nontender bowel sounds present. Musculoskeletal: No edema.  No joint effusion. Skin: No rash.  Skin appears warm. Neurologic: Alert awake oriented to time place and person.  Moves all extremities. Psychiatric: Appears normal.  Normal affect.   Labs on Admission: I have personally reviewed following labs and imaging studies  CBC:  Recent Labs Lab 09/17/17 1558 09/17/17 1958  WBC 13.5* 10.5  NEUTROABS  --  9.6*  HGB 14.5 14.1  HCT 43.6 41.3  MCV 89.7 88.2  PLT  --   951*   Basic Metabolic Panel:  Recent Labs Lab 09/17/17 1958  NA 130*  K 3.5  CL 97*  CO2 22  GLUCOSE 119*  BUN <5*  CREATININE 0.61  CALCIUM 9.1   GFR: Estimated Creatinine Clearance: 56.4 mL/min (by C-G formula based on SCr of 0.61 mg/dL). Liver Function Tests:  Recent Labs Lab 09/17/17 1958  AST 25  ALT 25  ALKPHOS 125  BILITOT 1.0  PROT 7.6  ALBUMIN 3.4*   No results for input(s): LIPASE, AMYLASE in the last 168 hours. No results for input(s): AMMONIA in the last 168 hours. Coagulation Profile: No results for input(s): INR, PROTIME in the last 168 hours. Cardiac Enzymes: No results for input(s): CKTOTAL, CKMB, CKMBINDEX, TROPONINI in the last 168 hours. BNP (last 3 results) No results for input(s): PROBNP in the last 8760 hours. HbA1C: No results for input(s): HGBA1C in the last 72 hours. CBG: No results for input(s): GLUCAP in the last 168 hours. Lipid Profile: No results for input(s): CHOL, HDL, LDLCALC, TRIG, CHOLHDL, LDLDIRECT in the last 72 hours. Thyroid Function Tests: No results for input(s): TSH, T4TOTAL, FREET4, T3FREE, THYROIDAB in the last 72 hours. Anemia Panel: No results for input(s): VITAMINB12, FOLATE, FERRITIN, TIBC, IRON, RETICCTPCT in the last 72 hours. Urine analysis:    Component Value Date/Time   COLORURINE YELLOW 02/13/2016 0237   APPEARANCEUR CLOUDY (A) 02/13/2016 0237   LABSPEC 1.007 02/13/2016 0237   PHURINE 7.0 02/13/2016 0237   GLUCOSEU NEGATIVE 02/13/2016 0237   HGBUR NEGATIVE 02/13/2016 0237   BILIRUBINUR negative 07/20/2017 1530   BILIRUBINUR neg 07/12/2015 2007   KETONESUR negative 07/20/2017 1530   KETONESUR NEGATIVE 02/13/2016 0237   PROTEINUR negative 07/20/2017 1530   PROTEINUR NEGATIVE 02/13/2016 0237   UROBILINOGEN 0.2 07/20/2017 1530   UROBILINOGEN 0.2 08/16/2015 1310   NITRITE Negative 07/20/2017 1530   NITRITE NEGATIVE 02/13/2016 0237   LEUKOCYTESUR Small (1+) (A) 07/20/2017 1530   Sepsis  Labs: @LABRCNTIP (procalcitonin:4,lacticidven:4) )No results found  for this or any previous visit (from the past 240 hour(s)).   Radiological Exams on Admission: Dg Chest 2 View  Result Date: 09/17/2017 CLINICAL DATA:  Short of breath, cough, history of COPD EXAM: CHEST  2 VIEW COMPARISON:  Chest x-ray of 09/04/2017 and 04/09/2017 FINDINGS: Although there are changes of pulmonary fibrosis present, there is more opacity in the right mid lung and right lung base suspicious for superimposed pneumonia. No pleural effusion is seen. There also is some peribronchial thickening most consistent with bronchitis. Mediastinal and hilar contours are unremarkable. The heart is within normal limits in size. No acute bony abnormality is seen. IMPRESSION: 1. Suspect patchy foci of pneumonia in the right mid lung and right lung base superimposed upon chronic fibrotic change. 2. Peribronchial thickening may indicate bronchitis. Electronically Signed   By: Ivar Drape M.D.   On: 09/17/2017 16:54   Ct Cervical Spine Wo Contrast  Result Date: 09/17/2017 CLINICAL DATA:  Right neck pain after fall today. No loss consciousness. EXAM: CT CERVICAL SPINE WITHOUT CONTRAST TECHNIQUE: Multidetector CT imaging of the cervical spine was performed without intravenous contrast. Multiplanar CT image reconstructions were also generated. COMPARISON:  None. FINDINGS: Alignment: Normal cervical lordosis. Intact craniocervical relationship. Osteoarthritis the atlantodental interval. Skull base and vertebrae: No skull fracture. Vertebral bodies and posterior elements are intact. Soft tissues and spinal canal: No prevertebral fluid or swelling. No visible canal hematoma. Disc levels: Small central disc herniations C2-3, C3-4 and C4-5 with broad-based disc bulging at C5-6 at C6-7. Mild disc space narrowing at C5-6 and C6-7. Minimal anterolisthesis grade 1 of C7 on T1. Mild bilateral C5-6 and right greater than left C6-7 uncovertebral  osteoarthritis. No jumped or perched facets. Mild facet arthropathy C7-T1 bilaterally with joint space narrowing and sclerosis. Upper chest: Centrilobular and paraseptal emphysema. Other: Moderate extracranial carotid arteriosclerosis. IMPRESSION: 1. Small central disc herniations C2 through C5 with disc bulges at C5-6 and C6-7. 2. No acute cervical spine fracture. 3. Minimal grade 1 anterolisthesis of C7 on T1. 4. Bilateral C7-T1 facet arthropathy. 5. Upper lobe emphysema. Electronically Signed   By: Ashley Royalty M.D.   On: 09/17/2017 21:17   Dg Hip Unilat With Pelvis 2-3 Views Left  Result Date: 09/17/2017 CLINICAL DATA:  Posterior left hip pain after a fall. EXAM: DG HIP (WITH OR WITHOUT PELVIS) 2-3V LEFT COMPARISON:  None. FINDINGS: There is no evidence of hip fracture or dislocation. There is no evidence of arthropathy or other focal bone abnormality. IMPRESSION: Negative. Electronically Signed   By: Misty Stanley M.D.   On: 09/17/2017 20:25     Assessment/Plan Principal Problem:   CAP (community acquired pneumonia) Active Problems:   COPD GOLD II/ still smoking   Essential hypertension   Near syncope    1. Community-acquired pneumonia -patient has been placed on Levaquin since patient is allergic to cephalosporin.  Influenza PCR is negative.  Check sputum cultures, urine for Legionella and strep antigen. 2. Near syncope -we will hold the patient's spironolactone and gently hydrate check orthostatics.  Check cardiac markers check 2D echo. 3. Hypertension -holding spironolactone due to near syncope.  PRN IV hydralazine for now. 4. Thrombocytopenia -cause not clear.  Closely follow CBC.  If there is any further decline may need further workup. 5. Tobacco abuse -advised to quit smoking. 6. COPD -not actively wheezing. 7. History of bronchiectasis.  I have reviewed patient's old charts and labs.   DVT prophylaxis: Lovenox. Code Status: Full code. Family Communication: Discussed with  patient.  Disposition Plan: Home. Consults called: Physical therapy. Admission status: Observation.   Rise Patience MD [facility] Pager [phone].  If 7PM-7AM, please contact night-coverage [email or web address] Password [email or web address]  09/17/2017, 11:05 PM

## 2017-09-17 NOTE — Addendum Note (Signed)
Addended by: Merri Ray R on: 09/17/2017 07:20 PM   Modules accepted: Level of Service

## 2017-09-18 ENCOUNTER — Other Ambulatory Visit: Payer: Self-pay | Admitting: Physician Assistant

## 2017-09-18 ENCOUNTER — Encounter (HOSPITAL_COMMUNITY): Payer: Self-pay | Admitting: General Practice

## 2017-09-18 ENCOUNTER — Observation Stay (HOSPITAL_BASED_OUTPATIENT_CLINIC_OR_DEPARTMENT_OTHER): Payer: Medicare Other

## 2017-09-18 ENCOUNTER — Inpatient Hospital Stay (HOSPITAL_COMMUNITY): Payer: Medicare Other

## 2017-09-18 DIAGNOSIS — R55 Syncope and collapse: Secondary | ICD-10-CM

## 2017-09-18 DIAGNOSIS — K219 Gastro-esophageal reflux disease without esophagitis: Secondary | ICD-10-CM | POA: Diagnosis present

## 2017-09-18 DIAGNOSIS — R51 Headache: Secondary | ICD-10-CM | POA: Diagnosis not present

## 2017-09-18 DIAGNOSIS — F419 Anxiety disorder, unspecified: Secondary | ICD-10-CM | POA: Diagnosis present

## 2017-09-18 DIAGNOSIS — Z7982 Long term (current) use of aspirin: Secondary | ICD-10-CM | POA: Diagnosis not present

## 2017-09-18 DIAGNOSIS — F17210 Nicotine dependence, cigarettes, uncomplicated: Secondary | ICD-10-CM | POA: Diagnosis present

## 2017-09-18 DIAGNOSIS — I10 Essential (primary) hypertension: Secondary | ICD-10-CM

## 2017-09-18 DIAGNOSIS — F418 Other specified anxiety disorders: Secondary | ICD-10-CM

## 2017-09-18 DIAGNOSIS — Z79899 Other long term (current) drug therapy: Secondary | ICD-10-CM | POA: Diagnosis not present

## 2017-09-18 DIAGNOSIS — J189 Pneumonia, unspecified organism: Secondary | ICD-10-CM | POA: Diagnosis not present

## 2017-09-18 DIAGNOSIS — I5030 Unspecified diastolic (congestive) heart failure: Secondary | ICD-10-CM

## 2017-09-18 DIAGNOSIS — J470 Bronchiectasis with acute lower respiratory infection: Secondary | ICD-10-CM | POA: Diagnosis present

## 2017-09-18 DIAGNOSIS — Z881 Allergy status to other antibiotic agents status: Secondary | ICD-10-CM | POA: Diagnosis not present

## 2017-09-18 HISTORY — DX: Pneumonia, unspecified organism: J18.9

## 2017-09-18 LAB — BASIC METABOLIC PANEL
ANION GAP: 9 (ref 5–15)
BUN: 9 mg/dL (ref 6–20)
CHLORIDE: 105 mmol/L (ref 101–111)
CO2: 21 mmol/L — ABNORMAL LOW (ref 22–32)
Calcium: 8 mg/dL — ABNORMAL LOW (ref 8.9–10.3)
Creatinine, Ser: 0.55 mg/dL (ref 0.44–1.00)
GFR calc Af Amer: >60 mL/min (ref >60)
GLUCOSE: 130 mg/dL — AB (ref 65–99)
POTASSIUM: 3.9 mmol/L (ref 3.5–5.1)
Sodium: 135 mmol/L (ref 135–145)

## 2017-09-18 LAB — CBC
HEMATOCRIT: 37 % (ref 36.0–46.0)
HEMOGLOBIN: 12.4 g/dL (ref 12.0–15.0)
MCH: 29.9 pg (ref 26.0–34.0)
MCHC: 33.5 g/dL (ref 30.0–36.0)
MCV: 89.2 fL (ref 78.0–100.0)
Platelets: 96 10*3/uL — ABNORMAL LOW (ref 150–400)
RBC: 4.15 MIL/uL (ref 3.87–5.11)
RDW: 14.3 % (ref 11.5–15.5)
WBC: 6.2 10*3/uL (ref 4.0–10.5)

## 2017-09-18 LAB — ECHOCARDIOGRAM COMPLETE
Height: 62.99 in
Weight: 2012.8 oz

## 2017-09-18 LAB — STREP PNEUMONIAE URINARY ANTIGEN: Strep Pneumo Urinary Antigen: NEGATIVE

## 2017-09-18 LAB — TROPONIN I
Troponin I: <0.03 ng/mL (ref <0.03)
Troponin I: <0.03 ng/mL (ref <0.03)
Troponin I: <0.03 ng/mL (ref <0.03)

## 2017-09-18 LAB — VALPROIC ACID LEVEL: VALPROIC ACID LVL: 32 ug/mL — AB (ref 50.0–100.0)

## 2017-09-18 LAB — PROCALCITONIN: Procalcitonin: 0.1 ng/mL

## 2017-09-18 LAB — CK: Total CK: 104 U/L (ref 38–234)

## 2017-09-18 MED ORDER — ENSURE ENLIVE PO LIQD: 237.0000 mL | Freq: Two times a day (BID) | ORAL | Status: DC

## 2017-09-18 MED ORDER — PREDNISONE 20 MG PO TABS
40.0000 mg | ORAL_TABLET | Freq: Every day | ORAL | Status: DC
  Administered 2017-09-19: 40 mg via ORAL
  Filled 2017-09-18: qty 2

## 2017-09-18 MED ORDER — IPRATROPIUM-ALBUTEROL 0.5-2.5 (3) MG/3ML IN SOLN: 3.0000 mL | Freq: Two times a day (BID) | RESPIRATORY_TRACT | Status: DC

## 2017-09-18 MED ORDER — ARFORMOTEROL TARTRATE 15 MCG/2ML IN NEBU
15.0000 ug | INHALATION_SOLUTION | Freq: Two times a day (BID) | RESPIRATORY_TRACT | Status: DC
  Administered 2017-09-19: 15 ug via RESPIRATORY_TRACT
  Filled 2017-09-18 (×2): qty 2

## 2017-09-18 MED ORDER — ALBUTEROL SULFATE (2.5 MG/3ML) 0.083% IN NEBU
2.5000 mg | INHALATION_SOLUTION | RESPIRATORY_TRACT | Status: DC | PRN
  Administered 2017-09-18 (×2): 2.5 mg via RESPIRATORY_TRACT
  Filled 2017-09-18 (×2): qty 3

## 2017-09-18 MED ORDER — HYDRALAZINE HCL 20 MG/ML IJ SOLN: 10.0000 mg | INTRAMUSCULAR | Status: DC | PRN

## 2017-09-18 MED ORDER — NICOTINE 14 MG/24HR TD PT24
14.0000 mg | MEDICATED_PATCH | Freq: Every day | TRANSDERMAL | Status: DC
  Administered 2017-09-18 – 2017-09-19 (×2): 14 mg via TRANSDERMAL
  Filled 2017-09-18 (×2): qty 1

## 2017-09-18 MED ORDER — IPRATROPIUM-ALBUTEROL 0.5-2.5 (3) MG/3ML IN SOLN
3.0000 mL | RESPIRATORY_TRACT | Status: DC | PRN
  Administered 2017-09-19: 3 mL via RESPIRATORY_TRACT
  Filled 2017-09-18: qty 3

## 2017-09-18 MED ORDER — BUDESONIDE 0.25 MG/2ML IN SUSP
0.2500 mg | Freq: Two times a day (BID) | RESPIRATORY_TRACT | Status: DC
  Administered 2017-09-18 – 2017-09-19 (×2): 0.25 mg via RESPIRATORY_TRACT
  Filled 2017-09-18 (×2): qty 2

## 2017-09-18 NOTE — Progress Notes (Signed)
Physical Therapy Evaluation Patient Details Name: Catherine Kelley MRN: [id number] DOB: [birth year] Today's Date: 09/18/2017   History of Present Illness  Pt is a 66 y.o. female who presented to the ED after a fall with reports of dizziness after visiting PCP office. Pt was at PCP office for fever and cough. X ray revealed pneumonia. PMH: COPD, bronchiectasis with ongoing tobacco abuse, hypertension.  Clinical Impression  Patient presents with the above. Patient was evaluated and findings noted below. Prior to admission patient was independent with mobility and ADLs. Patient reports that she is at her baseline level of function and does not demonstrate significant mobility deficits at this time. She will have necessary level of caregiver assist at home. At this time no further acute PT services are needed. PT will sign off.    Follow Up Recommendations No PT follow up;Supervision - Intermittent    Equipment Recommendations  None recommended by PT    Recommendations for Other Services       Precautions / Restrictions Precautions Precautions: Fall Restrictions Weight Bearing Restrictions: No      Mobility  Bed Mobility Overal bed mobility: Modified Independent             General bed mobility comments: pt utilized bed rails but was able to move to EOB with no difficulty or concern.  Transfers Overall transfer level: Modified independent Equipment used: None             General transfer comment: sit <> stand transfers x3. No difficulty demonstrated  Ambulation/Gait Ambulation/Gait assistance: Supervision Ambulation Distance (Feet): 110 Feet Assistive device: None Gait Pattern/deviations: Step-through pattern;Decreased stride length;Trunk flexed Gait velocity: decreased Gait velocity interpretation: Below normal speed for age/gender General Gait Details: pt was slow and steady but reports that is her baseline speed. No significant LOB noted and supervision only for  safety.  Stairs            Wheelchair Mobility    Modified Rankin (Stroke Patients Only)       Balance Overall balance assessment: Needs assistance Sitting-balance support: Feet supported;No upper extremity supported Sitting balance-Leahy Scale: Good     Standing balance support: During functional activity;No upper extremity supported Standing balance-Leahy Scale: Good Standing balance comment: able to maintain static standing at sink for some grooming             High level balance activites: Head turns;Direction changes;Turns High Level Balance Comments: mild instability noted but no significant LOB; no physical assistance required             Pertinent Vitals/Pain Pain Assessment: No/denies pain    Home Living Family/patient expects to be discharged to:: Private residence Living Arrangements: Spouse/significant other Available Help at Discharge: Family;Available PRN/intermittently Type of Home: Mobile home Home Access: Stairs to enter Entrance Stairs-Rails: Can reach both;Left;Right Entrance Stairs-Number of Steps: 4 Home Layout: One level Home Equipment: Bedside commode;Grab bars - toilet Additional Comments: can put BSC in shower as shower seat    Prior Function Level of Independence: Independent         Comments: pt states that she is independent with mobility and ADLs. States this is the first fall in several months. Furniture surfs at home. Doesn't drive as much as she used to.     Hand Dominance   Dominant Hand: Right    Extremity/Trunk Assessment   Upper Extremity Assessment Upper Extremity Assessment: Defer to OT evaluation    Lower Extremity Assessment Lower Extremity Assessment: Overall WFL for tasks  assessed (strength grossly 5/5; sensation intact to LT)    Cervical / Trunk Assessment Cervical / Trunk Assessment: Kyphotic  Communication   Communication: No difficulties  Cognition Arousal/Alertness: Awake/alert Behavior  During Therapy: WFL for tasks assessed/performed Overall Cognitive Status: Within Functional Limits for tasks assessed                                        General Comments      Exercises     Assessment/Plan    PT Assessment Patent does not need any further PT services  PT Problem List         PT Treatment Interventions      PT Goals (Current goals can be found in the Care Plan section)  Acute Rehab PT Goals Patient Stated Goal: to go home tomorrow PT Goal Formulation: With patient Time For Goal Achievement: 10/02/17 Potential to Achieve Goals: Good    Frequency     Barriers to discharge        Co-evaluation               AM-PAC PT "6 Clicks" Daily Activity  Outcome Measure Difficulty turning over in bed (including adjusting bedclothes, sheets and blankets)?: None Difficulty moving from lying on back to sitting on the side of the bed? : None Difficulty sitting down on and standing up from a chair with arms (e.g., wheelchair, bedside commode, etc,.)?: None Help needed moving to and from a bed to chair (including a wheelchair)?: A Little Help needed walking in hospital room?: A Little Help needed climbing 3-5 steps with a railing? : A Little 6 Click Score: 21    End of Session Equipment Utilized During Treatment: Gait belt Activity Tolerance: Patient tolerated treatment well Patient left: in chair;with call bell/phone within reach;with family/visitor present Nurse Communication: Mobility status PT Visit Diagnosis: History of falling (Z91.81);Difficulty in walking, not elsewhere classified (R26.2)    Time: 6568-1275 PT Time Calculation (min) (ACUTE ONLY): 21 min   Charges:   PT Evaluation $PT Eval Low Complexity: 1 Low     PT G CodesSindy Guadeloupe, SPT [phone] office   Margarita Grizzle 09/18/2017, 5:29 PM

## 2017-09-18 NOTE — ED Notes (Signed)
Pt given water 

## 2017-09-18 NOTE — Care Management Obs Status (Signed)
[place] NOTIFICATION   Patient Details  Name: ILDA LASKIN MRN: [id number] Date of Birth: 30-Apr-1951   Medicare Observation Status Notification Given:  Yes    Carles Collet, RN 09/18/2017, 1:17 PM

## 2017-09-18 NOTE — ED Notes (Signed)
Pt moved into hospital bed for comfort.  Linens were wet.  New gown and linen.  Warm blanket given.  New Purewick placed.  Pt denies any needs.

## 2017-09-18 NOTE — Progress Notes (Signed)
PROGRESS NOTE    Catherine Kelley  IWL:[id number] DOB: 10-23-1951 DOA: 09/17/2017 PCP: Mancel Bale, PA-C  Outpatient Specialists: Pulmonology (Dr. Chase Caller) - last seen 02/04/16    Brief Narrative:  Patient is a 66 y.o. female who presented to the ED from her PCP's office after a near syncopal episode, resulting in a fall.   Patient was at her PCP's for 1 wk of fever, chills, and productive cough.  PMHx significant for COPD, HTN, bronchiectasis, and tobacco abuse.  CXR done in ED on 10/18 showed patchy foci of pneumonia in right mid lung and right lung base, possible bronchitis.  CT of C-spine (for neck pain) was unremarkable.  Hip XR was negative.  Troponin was negative, trending.  EKG 10/19 - sinus tachycardia, no STEMI.  TTE done today - LV EF 17-40%, mild diastolic dysfunction.  Patient was admitted for observation of pneumonia and near syncope.     Assessment & Plan:   Principal Problem:   CAP (community acquired pneumonia) Active Problems:   COPD GOLD II/ still smoking   Essential hypertension   Near syncope   1. Community acquired pneumonia - Continue IV levofloxacin 750 mg daily - Sputum culture, grain stain, and legionella serology testing pending  - Continue benzonatate  PRN for cough  2. COPD GOLD II - Continue albuterol nebulizer 2.5 mg PRN  - Start Pulmicort nebulizer BID - Start Brovana nebulizer BID - Duoneb PRN for wheezing, SOB - Start prednisone 40 mg PO daily   3. Near syncope - Obtain orthostatic vitals as history highly suspicious for orthostatic hypotension. - Head CT for headache s/p fall and h/o near syncopal episodes and falls   4. Essential hypertension - Continue IV hydralazine 10 mg PRN for SBP > 160  5. Thrombocytopenia - Platelet count 96 this morning.  No signs of active bleeding, no home anticoagulation/blood thinners, LFTs unremarkable (10/18). - Continue to monitor.  6. Tobacco use - Nicotine patch - Advised patient to stop smoking.   Discussed resources available upon discharge, including 1-800-QUIT NOW, patches, gums, support groups, etc.   DVT prophylaxis: Lovenox, SCDs Code Status: Full Family Communication: Spoke to patient Disposition Plan: Home, expected 10/20   Consultants:   Pharmacy  Procedures:   TTE (10/18) - LV EF 81-44%, mild diastolic dysfunction.  ECG (10/19) - sinus tachycardia, no STEMI.    Antimicrobials:   IV Levofloxacin (scheduled to be completed 10/23)  IV Cefepime 2 g once as per pharmacy consult (10/18)  IV vancomycin 1 g once as per pharmacy consult (10/18)   Subjective: Patient states she is doing well today.  IV levofloxacin was started in the ED on 10/18.  Patient has tolerated this medication well.  Denies nausea, vomiting, diarrhea - baseline of constipation.  Patient states her breathing has improved since coming to ED - on 2L O2 via nasal cannula, does not use O2 at home.  Productive cough persists - sputum "clear or white or yellowish".  Denies hemoptysis, chest pain, SOB, lightheadedness/dizziness, chills, body aches.  Tolerating solid foods and liquids well.  Patient relays a history of recurrent right lower lobe pneumonia.  Still smoking cigarettes 1/2 ppd x 46 years (23 pack yr hx).    Objective: Vitals:   09/18/17 0700 09/18/17 0730 09/18/17 0800 09/18/17 0815  BP: 118/63 124/64 (!) 113/96 (!) 125/101  Pulse: 74 74 87 86  Resp: (!) 21 20 (!) 21 (!) 24  Temp:      TempSrc:  SpO2: 95% 96% 93% 94%    Intake/Output Summary (Last 24 hours) at 09/18/17 0834 Last data filed at 09/18/17 0235  Gross per 24 hour  Intake             3630 ml  Output             1900 ml  Net             1730 ml   There were no vitals filed for this visit.  Examination:  General exam: Appears calm and comfortable sitting in hospital bed.  No acute distress Respiratory system: On 2L O2 nasal cannula - respiratory effort normal.  Basilar rales bilaterally and right middle lobe.   Dullness to percussion right lower lobe.  Positive egophony right lower lobe. Cardiovascular system: 3/6 systolic ejection murmur, radiates to carotids.  S1 & S2 heard, RRR. No JVD, rubs, gallops or clicks. No pedal edema. Gastrointestinal system: Abdomen is soft and non-tender, mildly distended. No organomegaly or masses felt. Normal bowel sounds heard in all four quadrants Central nervous system: Alert and oriented x 4. Cranial nerves II - XII grossly intact.  No focal neurological deficits. Extremities: Symmetric 5 x 5 power. Skin: No rashes, lesions or ulcers Psychiatry: Judgement and insight appear normal. Mood & affect appropriate.  Talkative, tangential at times.    Data Reviewed: I have personally reviewed following labs and imaging studies  CBC:  Recent Labs Lab 09/17/17 1558 09/17/17 1958 09/18/17 0423  WBC 13.5* 10.5 6.2  NEUTROABS  --  9.6*  --   HGB 14.5 14.1 12.4  HCT 43.6 41.3 37.0  MCV 89.7 88.2 89.2  PLT  --  107* 96*   Basic Metabolic Panel:  Recent Labs Lab 09/17/17 1958 09/18/17 0423  NA 130* 135  K 3.5 3.9  CL 97* 105  CO2 22 21*  GLUCOSE 119* 130*  BUN <5* 9  CREATININE 0.61 0.55  CALCIUM 9.1 8.0*   GFR: Estimated Creatinine Clearance: 56.4 mL/min (by C-G formula based on SCr of 0.55 mg/dL). Liver Function Tests:  Recent Labs Lab 09/17/17 1958  AST 25  ALT 25  ALKPHOS 125  BILITOT 1.0  PROT 7.6  ALBUMIN 3.4*   No results for input(s): LIPASE, AMYLASE in the last 168 hours. No results for input(s): AMMONIA in the last 168 hours. Coagulation Profile: No results for input(s): INR, PROTIME in the last 168 hours. Cardiac Enzymes:  Recent Labs Lab 09/17/17 2303 09/18/17 0423  CKTOTAL 104  --   TROPONINI <0.03 <0.03   BNP (last 3 results) No results for input(s): PROBNP in the last 8760 hours. HbA1C: No results for input(s): HGBA1C in the last 72 hours. CBG: No results for input(s): GLUCAP in the last 168 hours. Lipid  Profile: No results for input(s): CHOL, HDL, LDLCALC, TRIG, CHOLHDL, LDLDIRECT in the last 72 hours. Thyroid Function Tests: No results for input(s): TSH, T4TOTAL, FREET4, T3FREE, THYROIDAB in the last 72 hours. Anemia Panel: No results for input(s): VITAMINB12, FOLATE, FERRITIN, TIBC, IRON, RETICCTPCT in the last 72 hours. Urine analysis:    Component Value Date/Time   COLORURINE YELLOW 02/13/2016 0237   APPEARANCEUR CLOUDY (A) 02/13/2016 0237   LABSPEC 1.007 02/13/2016 0237   PHURINE 7.0 02/13/2016 [place] 02/13/2016 0237   HGBUR NEGATIVE 02/13/2016 0237   BILIRUBINUR negative 07/20/2017 1530   BILIRUBINUR neg 07/12/2015 2007   KETONESUR negative 07/20/2017 [place] 02/13/2016 0237   PROTEINUR negative 07/20/2017 1530  PROTEINUR NEGATIVE 02/13/2016 0237   UROBILINOGEN 0.2 07/20/2017 1530   UROBILINOGEN 0.2 08/16/2015 1310   NITRITE Negative 07/20/2017 1530   NITRITE NEGATIVE 02/13/2016 0237   LEUKOCYTESUR Small (1+) (A) 07/20/2017 1530   Sepsis Labs: @LABRCNTIP (procalcitonin:4,lacticidven:4)  )No results found for this or any previous visit (from the past 240 hour(s)).       Radiology Studies: Dg Chest 2 View  Result Date: 09/17/2017 CLINICAL DATA:  Short of breath, cough, history of COPD EXAM: CHEST  2 VIEW COMPARISON:  Chest x-ray of 09/04/2017 and 04/09/2017 FINDINGS: Although there are changes of pulmonary fibrosis present, there is more opacity in the right mid lung and right lung base suspicious for superimposed pneumonia. No pleural effusion is seen. There also is some peribronchial thickening most consistent with bronchitis. Mediastinal and hilar contours are unremarkable. The heart is within normal limits in size. No acute bony abnormality is seen. IMPRESSION: 1. Suspect patchy foci of pneumonia in the right mid lung and right lung base superimposed upon chronic fibrotic change. 2. Peribronchial thickening may indicate bronchitis.  Electronically Signed   By: Ivar Drape M.D.   On: 09/17/2017 16:54   Ct Cervical Spine Wo Contrast  Result Date: 09/17/2017 CLINICAL DATA:  Right neck pain after fall today. No loss consciousness. EXAM: CT CERVICAL SPINE WITHOUT CONTRAST TECHNIQUE: Multidetector CT imaging of the cervical spine was performed without intravenous contrast. Multiplanar CT image reconstructions were also generated. COMPARISON:  None. FINDINGS: Alignment: Normal cervical lordosis. Intact craniocervical relationship. Osteoarthritis the atlantodental interval. Skull base and vertebrae: No skull fracture. Vertebral bodies and posterior elements are intact. Soft tissues and spinal canal: No prevertebral fluid or swelling. No visible canal hematoma. Disc levels: Small central disc herniations C2-3, C3-4 and C4-5 with broad-based disc bulging at C5-6 at C6-7. Mild disc space narrowing at C5-6 and C6-7. Minimal anterolisthesis grade 1 of C7 on T1. Mild bilateral C5-6 and right greater than left C6-7 uncovertebral osteoarthritis. No jumped or perched facets. Mild facet arthropathy C7-T1 bilaterally with joint space narrowing and sclerosis. Upper chest: Centrilobular and paraseptal emphysema. Other: Moderate extracranial carotid arteriosclerosis. IMPRESSION: 1. Small central disc herniations C2 through C5 with disc bulges at C5-6 and C6-7. 2. No acute cervical spine fracture. 3. Minimal grade 1 anterolisthesis of C7 on T1. 4. Bilateral C7-T1 facet arthropathy. 5. Upper lobe emphysema. Electronically Signed   By: Ashley Royalty M.D.   On: 09/17/2017 21:17   Dg Hip Unilat With Pelvis 2-3 Views Left  Result Date: 09/17/2017 CLINICAL DATA:  Posterior left hip pain after a fall. EXAM: DG HIP (WITH OR WITHOUT PELVIS) 2-3V LEFT COMPARISON:  None. FINDINGS: There is no evidence of hip fracture or dislocation. There is no evidence of arthropathy or other focal bone abnormality. IMPRESSION: Negative. Electronically Signed   By: Misty Stanley M.D.    On: 09/17/2017 20:25        Scheduled Meds: . ALPRAZolam  0.5 mg Oral QHS  . divalproex  500 mg Oral Daily  . fluticasone  2 spray Each Nare Daily  . guaiFENesin  600 mg Oral Q12H  . nortriptyline  100 mg Oral QHS   Continuous Infusions: . sodium chloride 75 mL/hr at 09/17/17 2330  . levofloxacin (LEVAQUIN) IV Stopped (09/18/17 0126)     LOS: 0 days      T[place]ania Hall-Potvin, PA-S [facility] Pager [phone]  If 7PM-7AM, please contact night-coverage [email or web address] Password [email or web address] 09/18/2017, 8:34 AM

## 2017-09-18 NOTE — ED Notes (Signed)
Bedside echo being done at this time.

## 2017-09-18 NOTE — ED Notes (Signed)
Have ordered pt a lunch tray.

## 2017-09-18 NOTE — ED Notes (Signed)
Called report to [place], [place]

## 2017-09-18 NOTE — Progress Notes (Signed)
  Echocardiogram 2D Echocardiogram has been performed.  Catherine Kelley 09/18/2017, 12:19 PM

## 2017-09-18 NOTE — ED Notes (Signed)
Attempted report x1. 

## 2017-09-18 NOTE — Care Management Note (Signed)
Case Management Note  Patient Details  Name: Catherine Kelley MRN: [id number] Date of Birth: [birth year]  Subjective/Objective:      Pt in with near syncope and pneumonia. She is from home.               Action/Plan: Awaiting PT/OT evaluations. CM following for d/c needs, physician orders.   Expected Discharge Date:                  Expected Discharge Plan:     In-House Referral:     Discharge planning Services     Post Acute Care Choice:    Choice offered to:     DME Arranged:    DME Agency:     HH Arranged:    HH Agency:     Status of Service:  In process, will continue to follow  If discussed at Long Length of Stay Meetings, dates discussed:    Additional Comments:  Pollie Friar, RN 09/18/2017, 3:13 PM

## 2017-09-19 LAB — LEGIONELLA PNEUMOPHILA SEROGP 1 UR AG: L. pneumophila Serogp 1 Ur Ag: NEGATIVE

## 2017-09-19 MED ORDER — BUDESONIDE-FORMOTEROL FUMARATE 160-4.5 MCG/ACT IN AERO
2.0000 | INHALATION_SPRAY | Freq: Two times a day (BID) | RESPIRATORY_TRACT | 12 refills | Status: DC
Start: 2017-09-19 — End: 2020-08-09

## 2017-09-19 MED ORDER — HYDROCODONE-HOMATROPINE 5-1.5 MG/5ML PO SYRP: 5.0000 mL | ORAL_SOLUTION | ORAL | 0 refills | Status: DC | PRN

## 2017-09-19 MED ORDER — FLUTICASONE PROPIONATE 50 MCG/ACT NA SUSP: 2.0000 | Freq: Every day | NASAL | 0 refills | Status: DC

## 2017-09-19 MED ORDER — PREDNISONE 20 MG PO TABS: 40.0000 mg | ORAL_TABLET | Freq: Every day | ORAL | 0 refills | Status: AC

## 2017-09-19 MED ORDER — HYDROCODONE-HOMATROPINE 5-1.5 MG/5ML PO SYRP: 5.0000 mL | ORAL_SOLUTION | ORAL | Status: DC | PRN

## 2017-09-19 MED ORDER — ALBUTEROL SULFATE HFA 108 (90 BASE) MCG/ACT IN AERS: 2.0000 | INHALATION_SPRAY | Freq: Four times a day (QID) | RESPIRATORY_TRACT | 0 refills | Status: DC | PRN

## 2017-09-19 MED ORDER — LEVOFLOXACIN 500 MG PO TABS: 500.0000 mg | ORAL_TABLET | Freq: Every day | ORAL | 0 refills | Status: DC

## 2017-09-19 NOTE — Discharge Summary (Signed)
Physician Discharge Summary  Catherine Kelley  WYO:[id number]  DOB: [birth year]  DOA: 09/17/2017 PCP: Mancel Bale, PA-C  Admit date: 09/17/2017 Discharge date: 09/19/2017  Admitted From: Home  Disposition:  Home  Recommendations for Outpatient Follow-up:  1. Follow up with PCP in 1 weeks 2. Levaquin for 7 days 3. Prednisone for 4 more days 4. Started on Symbicort twice daily  Discharge Condition: Stable CODE STATUS: Full code Diet recommendation: Heart Healthy    Brief/Interim Summary: Patient is 66 year old female who presented to the emergency department from her PCP after having a presyncopal episode.  Patient was seen at her PCP due to having  productive cough and fevers she was diagnosed with pneumonia and before going home she felt dizzy and felt she was going to pass out, did not ever lose consciousness but fell and hit her hip.  Patient was admitted for community-acquired pneumonia and for evaluation of near syncope.  Patient was started on IV antibiotics.  Evaluation for syncope was performed with echocardiogram which was within normal limits.  CT of the head which was negative she was monitored on telemetry which did not show any arrhythmias.  Orthostatic vital signs were obtained which were normal.  Patient clinically improved, remained afebrile during hospital stay.  Patient was discharged home with oral antibiotic, prednisone and inhaler.  Patient advised to follow-up with PCP.   Subjective: Patient seen and examined, her breathing has improved.  Complaining of cough, could not sleep well last night.  Tolerating diet well.   Discharge Diagnoses/Hospital Course:  Community-acquired pneumonia Initially treated with IV levaquin Continue antibiotic, Levaquin to complete 7 days, prednisone 40 mg x 5 days, patient discharged on Symbicort twice daily and albuterol as needed No blood cultures were sent on admission.Procalcitonin less than 0.10 although clinical picture correlated with  pneumonia.   COPD  See above  Near syncope Patient was evaluated with CT scan of the head, echocardiogram, orthostatic vital signs, and telemetry monitor all within normal limits.  Patient advised to follow-up with PCP if dizziness is continues.   Hypertension Blood pressure stable during hospital stay No changes in medication were made  Tobacco abuse Nicotine patch was applied during hospital stay Tobacco cessation was discussed  All other chronic medical condition were stable during the hospitalization.  On the day of the discharge the patient's vitals were stable, and no other acute medical condition were reported by patient. the patient was felt safe to be discharge to home  Discharge Instructions  You were cared for by a hospitalist during your hospital stay. If you have any questions about your discharge medications or the care you received while you were in the hospital after you are discharged, you can call the unit and asked to speak with the hospitalist on call if the hospitalist that took care of you is not available. Once you are discharged, your primary care physician will handle any further medical issues. Please note that NO REFILLS for any discharge medications will be authorized once you are discharged, as it is imperative that you return to your primary care physician (or establish a relationship with a primary care physician if you do not have one) for your aftercare needs so that they can reassess your need for medications and monitor your lab values.  Discharge Instructions    Call MD for:  difficulty breathing, headache or visual disturbances    Complete by:  As directed    Call MD for:  extreme fatigue  Complete by:  As directed    Call MD for:  hives    Complete by:  As directed    Call MD for:  persistant dizziness or light-headedness    Complete by:  As directed    Call MD for:  persistant nausea and vomiting    Complete by:  As directed    Call MD for:   redness, tenderness, or signs of infection (pain, swelling, redness, odor or green/yellow discharge around incision site)    Complete by:  As directed    Call MD for:  severe uncontrolled pain    Complete by:  As directed    Call MD for:  temperature >100.4    Complete by:  As directed    Diet - low sodium heart healthy    Complete by:  As directed    Increase activity slowly    Complete by:  As directed      Allergies as of 09/19/2017      Reactions   Sulfa Drugs Cross Reactors Hives, Rash   Hives and rash   Ceclor [cefaclor] Hives   Tolerated ceftazidime December 2016   Depakote Er [divalproex Sodium Er] Other (See Comments)   Patient remarked that when she was taking this TWICE a day, she became "clumsy" and felt more prone to stumbling   Escitalopram Oxalate Other (See Comments)   Pt does not recall ever taking medication (??)   Sertraline Hcl    Severe headache      Medication List    STOP taking these medications   albuterol (2.5 MG/3ML) 0.083% nebulizer solution Commonly known as:  PROVENTIL Replaced by:  albuterol 108 (90 Base) MCG/ACT inhaler   benzonatate 100 MG capsule Commonly known as:  TESSALON   guaiFENesin 600 MG 12 hr tablet Commonly known as:  MUCINEX   nystatin 100000 UNIT/ML suspension Commonly known as:  MYCOSTATIN   tiZANidine 4 MG tablet Commonly known as:  ZANAFLEX     TAKE these medications   albuterol 108 (90 Base) MCG/ACT inhaler Commonly known as:  PROVENTIL HFA;VENTOLIN HFA Inhale 2 puffs into the lungs every 6 (six) hours as needed for wheezing or shortness of breath. Replaces:  albuterol (2.5 MG/3ML) 0.083% nebulizer solution   ALPRAZolam 0.5 MG tablet Commonly known as:  XANAX TAKE 1 TABLET AT BEDTIME What changed:  See the new instructions.   aspirin EC 81 MG tablet Take 81-162 mg by mouth See admin instructions. 81 mg once a day "for heart health" and 162 mg one to two times a day as needed for headaches    budesonide-formoterol 160-4.5 MCG/ACT inhaler Commonly known as:  SYMBICORT Inhale 2 puffs into the lungs 2 (two) times daily.   calcium carbonate 750 MG chewable tablet Commonly known as:  TUMS EX Chew 1 tablet by mouth 2 (two) times daily as needed for heartburn.   divalproex 500 MG 24 hr tablet Commonly known as:  DEPAKOTE ER TAKE 1 TABLET BY MOUTH TWICE A DAY What changed:  See the new instructions.   fluticasone 50 MCG/ACT nasal spray Commonly known as:  FLONASE Place 2 sprays into both nostrils daily.   HYDROcodone-acetaminophen 5-325 MG tablet Commonly known as:  NORCO/VICODIN Take 0.5-1 tablets by mouth every 8 (eight) hours as needed for moderate pain.   HYDROcodone-homatropine 5-1.5 MG/5ML syrup Commonly known as:  HYCODAN Take 5 mLs by mouth every 4 (four) hours as needed for cough.   levofloxacin 500 MG tablet Commonly known as:  LEVAQUIN  Take 1 tablet (500 mg total) by mouth daily. What changed:  additional instructions   nortriptyline 50 MG capsule Commonly known as:  PAMELOR Take 2 capsules (100 mg total) by mouth Nightly. What changed:  when to take this   predniSONE 20 MG tablet Commonly known as:  DELTASONE Take 2 tablets (40 mg total) by mouth daily with breakfast. What changed:  medication strength  how much to take  how to take this  when to take this  additional instructions   spironolactone 25 MG tablet Commonly known as:  ALDACTONE Take 1 tablet (25 mg total) by mouth daily.   triamcinolone cream 0.1 % Commonly known as:  KENALOG Apply 1 application topically 2 (two) times daily. What changed:  when to take this  reasons to take this       Allergies  Allergen Reactions  . Sulfa Drugs Cross Reactors Hives and Rash    Hives and rash  . Ceclor [Cefaclor] Hives    Tolerated ceftazidime December 2016  . Depakote Er [Divalproex Sodium Er] Other (See Comments)    Patient remarked that when she was taking this TWICE a day, she  became "clumsy" and felt more prone to stumbling  . Escitalopram Oxalate Other (See Comments)    Pt does not recall ever taking medication (??)  . Sertraline Hcl     Severe headache    Consultations:  None   Procedures/Studies: Dg Chest 2 View  Result Date: 09/17/2017 CLINICAL DATA:  Short of breath, cough, history of COPD EXAM: CHEST  2 VIEW COMPARISON:  Chest x-ray of 09/04/2017 and 04/09/2017 FINDINGS: Although there are changes of pulmonary fibrosis present, there is more opacity in the right mid lung and right lung base suspicious for superimposed pneumonia. No pleural effusion is seen. There also is some peribronchial thickening most consistent with bronchitis. Mediastinal and hilar contours are unremarkable. The heart is within normal limits in size. No acute bony abnormality is seen. IMPRESSION: 1. Suspect patchy foci of pneumonia in the right mid lung and right lung base superimposed upon chronic fibrotic change. 2. Peribronchial thickening may indicate bronchitis. Electronically Signed   By: Ivar Drape M.D.   On: 09/17/2017 16:54   Dg Chest 2 View  Result Date: 09/04/2017 CLINICAL DATA:  Cough.  Smoker EXAM: CHEST  2 VIEW COMPARISON:  04/09/2017 FINDINGS: Normal heart size. No pleural effusion or edema. Chronic interstitial coarsening is identified throughout both lungs compatible with emphysema. Scarring, architectural distortion and bronchiectasis within the right lower lobe is identified compatible with postinflammatory/infectious changes. No acute superimposed cardiopulmonary abnormality noted. IMPRESSION: 1. COPD/emphysema. 2. Right lower lobe post infectious and inflammatory changes. Electronically Signed   By: Kerby Moors M.D.   On: 09/04/2017 14:44   Dg Lumbar Spine 2-3 Views  Result Date: 09/04/2017 CLINICAL DATA:  Back pain.  Fall 1 week ago. EXAM: LUMBAR SPINE - 2-3 VIEW COMPARISON:  04/29/2015 FINDINGS: Anterolisthesis L4 upon L5 is stable. Levoscoliosis at L3-4 is  stable. There is severe narrowing of the L3-4, L4-5, and L5-S1 discs. This is unchanged. No vertebral compression deformity. L4 pars defects cannot be excluded. No definite fracture. IMPRESSION: No acute bony pathology.  Stable chronic changes. Electronically Signed   By: Marybelle Killings M.D.   On: 09/04/2017 14:46   Ct Head Wo Contrast  Result Date: 09/18/2017 CLINICAL DATA:  Headache.  History of hypertension. EXAM: CT HEAD WITHOUT CONTRAST TECHNIQUE: Contiguous axial images were obtained from the base of the skull through the  vertex without intravenous contrast. COMPARISON:  CT HEAD November 01, 2015 FINDINGS: BRAIN: No intraparenchymal hemorrhage, mass effect nor midline shift. The ventricles and sulci are normal for age. Patchy supratentorial white matter hypodensities within normal range for patient's age, though non-specific are most compatible with chronic small vessel ischemic disease. No acute large vascular territory infarcts. No abnormal extra-axial fluid collections. Basal cisterns are patent. VASCULAR: Mild to moderate calcific atherosclerosis of the carotid siphons. SKULL: No skull fracture. No significant scalp soft tissue swelling. SINUSES/ORBITS: Mild paranasal sinus mucosal thickening. Maxillary sinus bony remodeling. Mastoid air cells are well aerated.The included ocular globes and orbital contents are non-suspicious. OTHER: Poor dentition. IMPRESSION: Stable negative noncontrast CT HEAD for age. Electronically Signed   By: Elon Alas M.D.   On: 09/18/2017 19:54   Ct Cervical Spine Wo Contrast  Result Date: 09/17/2017 CLINICAL DATA:  Right neck pain after fall today. No loss consciousness. EXAM: CT CERVICAL SPINE WITHOUT CONTRAST TECHNIQUE: Multidetector CT imaging of the cervical spine was performed without intravenous contrast. Multiplanar CT image reconstructions were also generated. COMPARISON:  None. FINDINGS: Alignment: Normal cervical lordosis. Intact craniocervical  relationship. Osteoarthritis the atlantodental interval. Skull base and vertebrae: No skull fracture. Vertebral bodies and posterior elements are intact. Soft tissues and spinal canal: No prevertebral fluid or swelling. No visible canal hematoma. Disc levels: Small central disc herniations C2-3, C3-4 and C4-5 with broad-based disc bulging at C5-6 at C6-7. Mild disc space narrowing at C5-6 and C6-7. Minimal anterolisthesis grade 1 of C7 on T1. Mild bilateral C5-6 and right greater than left C6-7 uncovertebral osteoarthritis. No jumped or perched facets. Mild facet arthropathy C7-T1 bilaterally with joint space narrowing and sclerosis. Upper chest: Centrilobular and paraseptal emphysema. Other: Moderate extracranial carotid arteriosclerosis. IMPRESSION: 1. Small central disc herniations C2 through C5 with disc bulges at C5-6 and C6-7. 2. No acute cervical spine fracture. 3. Minimal grade 1 anterolisthesis of C7 on T1. 4. Bilateral C7-T1 facet arthropathy. 5. Upper lobe emphysema. Electronically Signed   By: Ashley Royalty M.D.   On: 09/17/2017 21:17   Dg Hip Unilat With Pelvis 2-3 Views Left  Result Date: 09/17/2017 CLINICAL DATA:  Posterior left hip pain after a fall. EXAM: DG HIP (WITH OR WITHOUT PELVIS) 2-3V LEFT COMPARISON:  None. FINDINGS: There is no evidence of hip fracture or dislocation. There is no evidence of arthropathy or other focal bone abnormality. IMPRESSION: Negative. Electronically Signed   By: Misty Stanley M.D.   On: 09/17/2017 20:25   Echocardiogram 09/18/2017 ------------------------------------------------------------------- Study Conclusions  - Left ventricle: The cavity size was normal. Wall thickness was   normal. Systolic function was normal. The estimated ejection   fraction was in the range of 60% to 65%. Wall motion was normal;   there were no regional wall motion abnormalities. Doppler   parameters are consistent with abnormal left ventricular   relaxation (grade 1  diastolic dysfunction). - Mitral valve: Calcified annulus.  Impressions:  - Normal LV systolic function; mild diastolic dysfunction.  Discharge Exam: Vitals:   09/19/17 0829 09/19/17 1001  BP:  (!) 129/50  Pulse:  (!) 116  Resp:  (!) 22  Temp:  98.8 F (37.1 C)  SpO2: 95% 92%   Vitals:   09/19/17 0415 09/19/17 0452 09/19/17 0829 09/19/17 1001  BP:  131/61  (!) 129/50  Pulse:  (!) 101  (!) 116  Resp:  18  (!) 22  Temp:  97.9 F (36.6 C)  98.8 F (37.1 C)  TempSrc:  Oral  Oral  SpO2: 92% 92% 95% 92%    General: Pt is alert, awake, not in acute distress Cardiovascular: RRR, S1/S2 +, no rubs, no gallops Respiratory: Good air entry, coarse breath sound, no wheezing Abdominal: Soft, NT, ND, bowel sounds + Extremities: no edema  The results of significant diagnostics from this hospitalization (including imaging, microbiology, ancillary and laboratory) are listed below for reference.     Microbiology: No results found for this or any previous visit (from the past 240 hour(s)).   Labs: BNP (last 3 results) No results for input(s): BNP in the last 8760 hours. Basic Metabolic Panel:  Recent Labs Lab 09/17/17 1958 09/18/17 0423  NA 130* 135  K 3.5 3.9  CL 97* 105  CO2 22 21*  GLUCOSE 119* 130*  BUN <5* 9  CREATININE 0.61 0.55  CALCIUM 9.1 8.0*   Liver Function Tests:  Recent Labs Lab 09/17/17 1958  AST 25  ALT 25  ALKPHOS 125  BILITOT 1.0  PROT 7.6  ALBUMIN 3.4*   No results for input(s): LIPASE, AMYLASE in the last 168 hours. No results for input(s): AMMONIA in the last 168 hours. CBC:  Recent Labs Lab 09/17/17 1558 09/17/17 1958 09/18/17 0423  WBC 13.5* 10.5 6.2  NEUTROABS  --  9.6*  --   HGB 14.5 14.1 12.4  HCT 43.6 41.3 37.0  MCV 89.7 88.2 89.2  PLT  --  107* 96*   Cardiac Enzymes:  Recent Labs Lab 09/17/17 2303 09/18/17 0423 09/18/17 1221  CKTOTAL 104  --   --   TROPONINI <0.03 <0.03 <0.03   BNP: Invalid input(s):  POCBNP CBG: No results for input(s): GLUCAP in the last 168 hours. D-Dimer No results for input(s): DDIMER in the last 72 hours. Hgb A1c No results for input(s): HGBA1C in the last 72 hours. Lipid Profile No results for input(s): CHOL, HDL, LDLCALC, TRIG, CHOLHDL, LDLDIRECT in the last 72 hours. Thyroid function studies No results for input(s): TSH, T4TOTAL, T3FREE, THYROIDAB in the last 72 hours.  Invalid input(s): FREET3 Anemia work up No results for input(s): VITAMINB12, FOLATE, FERRITIN, TIBC, IRON, RETICCTPCT in the last 72 hours. Urinalysis    Component Value Date/Time   COLORURINE YELLOW 02/13/2016 0237   APPEARANCEUR CLOUDY (A) 02/13/2016 0237   LABSPEC 1.007 02/13/2016 0237   PHURINE 7.0 02/13/2016 0237   GLUCOSEU NEGATIVE 02/13/2016 0237   HGBUR NEGATIVE 02/13/2016 0237   BILIRUBINUR negative 07/20/2017 1530   BILIRUBINUR neg 07/12/2015 2007   KETONESUR negative 07/20/2017 1530   KETONESUR NEGATIVE 02/13/2016 0237   PROTEINUR negative 07/20/2017 1530   PROTEINUR NEGATIVE 02/13/2016 0237   UROBILINOGEN 0.2 07/20/2017 1530   UROBILINOGEN 0.2 08/16/2015 1310   NITRITE Negative 07/20/2017 1530   NITRITE NEGATIVE 02/13/2016 0237   LEUKOCYTESUR Small (1+) (A) 07/20/2017 1530   Sepsis Labs Invalid input(s): PROCALCITONIN,  WBC,  LACTICIDVEN Microbiology No results found for this or any previous visit (from the past 240 hour(s)).   Time coordinating discharge: 32 minutes  SIGNED:  Chipper Oman, MD  [facility] 09/19/2017, 8:32 PM  Pager please text page via  [email or web address] Password [email or web address]

## 2017-09-19 NOTE — Progress Notes (Signed)
Pt being discharged from hospital per orders from MD. Pt and family educated on discharge instructions. Pt and family verbalized understanding of instructions. All questions and concerns were addressed. Pt's IV was removed prior to discharge. Pt exited hospital via wheelchair.

## 2017-09-21 ENCOUNTER — Telehealth: Payer: Self-pay

## 2017-09-21 NOTE — Telephone Encounter (Signed)
TOC call- Left message on voicemail for patient to return my call for her hospital follow up call.

## 2017-09-22 ENCOUNTER — Telehealth: Payer: Self-pay

## 2017-09-22 NOTE — Telephone Encounter (Signed)
Transition Care Management Follow-up Telephone Call   Date discharged?09/19/17   How have you been since you were released from the hospital? Patient states that she is feeling better but her left knee has been hurting and her balance has been off.    Do you understand why you were in the hospital? yes   Do you understand the discharge instructions? yes   Where were you discharged to? home   Items Reviewed:  Medications reviewed: yes  Allergies reviewed: yes  Dietary changes reviewed: yes  Referrals reviewed: yes   Functional Questionnaire:   Activities of Daily Living (ADLs):   She states they are independent in the following: ambulation, bathing and hygiene, feeding, continence, grooming, toileting, dressing States they require assistance with the following: none   Any transportation issues/concerns?: no   Any patient concerns? no   Confirmed importance and date/time of follow-up visits scheduled yes  Provider Appointment booked with Windell Hummingbird on 09/29/17 @ 1:40 pm.   Confirmed with patient if condition begins to worsen call PCP or go to the ER.  Patient was given the office number and encouraged to call back with question or concerns. : yes

## 2017-09-24 ENCOUNTER — Other Ambulatory Visit: Payer: Self-pay | Admitting: Physician Assistant

## 2017-09-24 DIAGNOSIS — F5105 Insomnia due to other mental disorder: Principal | ICD-10-CM

## 2017-09-24 DIAGNOSIS — F418 Other specified anxiety disorders: Secondary | ICD-10-CM

## 2017-09-24 NOTE — Telephone Encounter (Signed)
Please advise 

## 2017-09-25 NOTE — Telephone Encounter (Signed)
Done - please send to pharmacy

## 2017-09-27 ENCOUNTER — Other Ambulatory Visit: Payer: Self-pay | Admitting: Physician Assistant

## 2017-09-27 DIAGNOSIS — F418 Other specified anxiety disorders: Secondary | ICD-10-CM

## 2017-09-27 DIAGNOSIS — F5105 Insomnia due to other mental disorder: Principal | ICD-10-CM

## 2017-09-28 ENCOUNTER — Telehealth: Payer: Self-pay | Admitting: Physician Assistant

## 2017-09-28 ENCOUNTER — Other Ambulatory Visit: Payer: Self-pay | Admitting: Physician Assistant

## 2017-09-28 DIAGNOSIS — F5105 Insomnia due to other mental disorder: Principal | ICD-10-CM

## 2017-09-28 DIAGNOSIS — F418 Other specified anxiety disorders: Secondary | ICD-10-CM

## 2017-09-28 NOTE — Telephone Encounter (Signed)
Called in Rx. Pt advised.

## 2017-09-28 NOTE — Telephone Encounter (Signed)
Was called into pharmacy

## 2017-09-28 NOTE — Telephone Encounter (Signed)
Pt is calling stating that the xanax rx that was printed on 09/25/17 has not made it to the pharmacy as of yet [facility]  And it needs to be refaxed  Best number [phone]

## 2017-09-29 ENCOUNTER — Inpatient Hospital Stay: Payer: Self-pay | Admitting: Physician Assistant

## 2017-10-02 ENCOUNTER — Encounter: Payer: Self-pay | Admitting: Family Medicine

## 2017-10-02 ENCOUNTER — Ambulatory Visit (INDEPENDENT_AMBULATORY_CARE_PROVIDER_SITE_OTHER): Payer: Medicare Other | Admitting: Family Medicine

## 2017-10-02 VITALS — BP 112/60 | HR 91 | Temp 97.5°F | Resp 20 | Ht 62.99 in | Wt 126.0 lb

## 2017-10-02 DIAGNOSIS — J189 Pneumonia, unspecified organism: Secondary | ICD-10-CM | POA: Diagnosis not present

## 2017-10-02 DIAGNOSIS — F17210 Nicotine dependence, cigarettes, uncomplicated: Secondary | ICD-10-CM | POA: Diagnosis not present

## 2017-10-02 DIAGNOSIS — R55 Syncope and collapse: Secondary | ICD-10-CM | POA: Diagnosis not present

## 2017-10-02 DIAGNOSIS — Z09 Encounter for follow-up examination after completed treatment for conditions other than malignant neoplasm: Secondary | ICD-10-CM

## 2017-10-02 MED ORDER — BENZONATATE 100 MG PO CAPS: 100.0000 mg | ORAL_CAPSULE | Freq: Three times a day (TID) | ORAL | 0 refills | Status: DC | PRN

## 2017-10-02 NOTE — Patient Instructions (Signed)
     IF you received an x-ray today, you will receive an invoice from [facility]. Please contact [facility] at [phone] with questions or concerns regarding your invoice.   IF you received labwork today, you will receive an invoice from [facility]. Please contact [facility] at [phone] with questions or concerns regarding your invoice.   Our billing staff will not be able to assist you with questions regarding bills from these companies.  You will be contacted with the lab results as soon as they are available. The fastest way to get your results is to activate your My Chart account. Instructions are located on the last page of this paperwork. If you have not heard from us regarding the results in 2 weeks, please contact this office.     

## 2017-10-02 NOTE — Progress Notes (Signed)
11/2/20183:33 PM  ASMI Catherine Kelley 06-26-1951, 66 y.o. female [id number]  Chief Complaint  Patient presents with  . Pneumonia    follow up     HPI:   Patient is a 66 y.o. female with past medical history significant for COPD, smoking, recurrent PNA who presents today for hospital fu for CAP and eval for near syncope. Patient admitted from 10/18-23rd.  Near syncope eval with echo, ct scan and cardiac monitoring was normal. Though to be related to CAP.  CAP - RML and RLL. Treated with levaquin and prednisone burst. She was rx symbicort which she cant afford. DC home on room air. She reports feeling about 40% better, cough much improved, decreased sputum. Still gets tired easily. No SOB. No fever or chills. Smoking about 1/2 ppd, used to smoke 1ppd. In the recent past was able to quit for about a month, resumed smoking due to stress, however now retired so stress sign less. Her husband still smokes. Requesting refill of tessalon pearles.   Depression screen [facility] 2/9 10/02/2017 09/04/2017 07/20/2017  Decreased Interest 0 0 0  Down, Depressed, Hopeless 0 0 0  PHQ - 2 Score 0 0 0    Allergies  Allergen Reactions  . Sulfa Drugs Cross Reactors Hives and Rash    Hives and rash  . Ceclor [Cefaclor] Hives    Tolerated ceftazidime December 2016  . Depakote Er [Divalproex Sodium Er] Other (See Comments)    Patient remarked that when she was taking this TWICE a day, she became "clumsy" and felt more prone to stumbling  . Escitalopram Oxalate Other (See Comments)    Pt does not recall ever taking medication (??)  . Sertraline Hcl     Severe headache    Prior to Admission medications   Medication Sig Start Date End Date Taking? Authorizing Provider  albuterol (PROVENTIL HFA;VENTOLIN HFA) 108 (90 Base) MCG/ACT inhaler Inhale 2 puffs into the lungs every 6 (six) hours as needed for wheezing or shortness of breath. 09/19/17  Yes Patrecia Pour, Christean Grief, MD  ALPRAZolam Duanne Moron) 0.5 MG tablet TAKE 1  TABLET BY MOUTH AT BEDTIME 09/25/17  Yes Weber, Damaris Hippo, PA-C  aspirin EC 81 MG tablet Take 81-162 mg by mouth See admin instructions. 81 mg once a day "for heart health" and 162 mg one to two times a day as needed for headaches   Yes [provider]  calcium carbonate (TUMS EX) 750 MG chewable tablet Chew 1 tablet by mouth 2 (two) times daily as needed for heartburn.   Yes [provider]  divalproex (DEPAKOTE ER) 500 MG 24 hr tablet TAKE 1 TABLET BY MOUTH TWICE A DAY Patient taking differently: Take 500 mg by mouth at bedtime 08/13/17  Yes Weber, Sarah L, PA-C  fluticasone (FLONASE) 50 MCG/ACT nasal spray Place 2 sprays into both nostrils daily. 09/19/17  Yes Patrecia Pour, Christean Grief, MD  HYDROcodone-acetaminophen (NORCO/VICODIN) 5-325 MG tablet Take 0.5-1 tablets by mouth every 8 (eight) hours as needed for moderate pain. 09/04/17  Yes Weber, Sarah L, PA-C  HYDROcodone-homatropine (HYCODAN) 5-1.5 MG/5ML syrup Take 5 mLs by mouth every 4 (four) hours as needed for cough. 09/19/17  Yes Patrecia Pour, Christean Grief, MD  nortriptyline (PAMELOR) 50 MG capsule Take 2 capsules (100 mg total) by mouth Nightly. Patient taking differently: Take 100 mg by mouth at bedtime.  05/26/17  Yes Weber, Sarah L, PA-C  nystatin (MYCOSTATIN) 100000 UNIT/ML suspension TAKE 5 MLS (500,000 UNITS TOTAL) BY MOUTH 4 (FOUR) TIMES DAILY.  09/25/17  Yes Weber, Damaris Hippo, PA-C  spironolactone (ALDACTONE) 25 MG tablet Take 1 tablet (25 mg total) by mouth daily. 09/04/17  Yes Weber, Sarah L, PA-C  triamcinolone cream (KENALOG) 0.1 % Apply 1 application topically 2 (two) times daily. Patient taking differently: Apply 1 application topically 2 (two) times daily as needed (for irritation).  02/07/17  Yes Forrest Moron, MD  benzonatate (TESSALON) 100 MG capsule Take 1 capsule (100 mg total) by mouth 3 (three) times daily as needed for cough. 10/02/17   Rutherford Guys, MD    Past Medical History:  Diagnosis Date  . Anxiety   .  Arthritis    "jooints" (09/18/2017)  . Asthma   . Bipolar disorder ([place])   . CAP (community acquired pneumonia) 09/18/2017  . Cat allergies   . Chronic bronchitis ([place])    "get it alot; maybe not q yr" (07/06/2014)  . Chronic lower back pain    "spurs and pinched nerves" (09/18/2017)  . COPD (chronic obstructive pulmonary disease) ([place])   . DDD (degenerative disc disease), lumbar   . Depression    psychiatrist Dr. Toy Care  . Diastolic dysfunction   . Emphysematous COPD ([place])    changes in right base along with patchy areas on last x-ray  . Falls frequently    "several in 2017; fell at dr's office yesterday" (09/18/2017)  . GERD (gastroesophageal reflux disease)   . HCAP (healthcare-associated pneumonia) 02/12/2016  . Heart murmur   . History of cardiovascular stress test 08/15/2004   EF of 70% -- Normal stress cardiolite.  There is no evidence of ischemia and there is normal LV function. -- [name]. MD  . History of echocardiogram 12/24/2006   Est. EF of 98-11% NORMAL LV SYSTOLIC FUNCTION WITH IMPAIRED RELAXATION -- MILD AORTIC SCLEROSIS -- NORMAL PALONARY ARTERY PRESSURE -- NO OLD ECHOS FOR COMPARISON -- [name], MD  . Hypertension    essential hypertension  . Migraine    "when I was having my periods; none since then" (09/18/2017)  . Necrotic pneumonia ([place])    recurrent/notes 02/12/2016  . Pneumonia    "several times since May 2015" (07/06/2014)  . Pollen allergies   . Tobacco abuse    smoking about 1/2 pk of cigarettes a day    Past Surgical History:  Procedure Laterality Date  . DILATION AND CURETTAGE OF UTERUS    . TUBAL LIGATION  1986    Social History  Substance Use Topics  . Smoking status: Current Every Day Smoker    Packs/day: 0.50    Years: 46.00    Types: Cigarettes  . Smokeless tobacco: Never Used  . Alcohol use 0.0 oz/week     Comment: 09/18/2017 "might have 1 drink/month; if that"    Family History  Problem Relation Age of Onset  .  Stroke Mother   . Heart disease Father   . Hyperlipidemia Father   . Hypertension Father   . Breast cancer Maternal Aunt   . Asthma Maternal Grandmother     Review of Systems  Constitutional: Negative for chills and fever.  Respiratory: Positive for cough. Negative for shortness of breath.   Cardiovascular: Negative for chest pain, palpitations and leg swelling.  Gastrointestinal: Negative for abdominal pain, nausea and vomiting.     OBJECTIVE:  Blood pressure 112/60, pulse 91, temperature (!) 97.5 F (36.4 C), temperature source Oral, resp. rate 20, height 5' 2.99" (1.6 m), weight 126 lb (57.2 kg), SpO2 96 %.  Physical  Exam  Constitutional: She is oriented to person, place, and time and well-developed, well-nourished, and in no distress.  HENT:  Head: Normocephalic and atraumatic.  Right Ear: Hearing, tympanic membrane, external ear and ear canal normal.  Left Ear: Hearing, tympanic membrane, external ear and ear canal normal.  Mouth/Throat: Oropharynx is clear and moist. No oropharyngeal exudate.  Eyes: Pupils are equal, round, and reactive to light. EOM are normal. No scleral icterus.  Neck: Neck supple. No thyromegaly present.  Cardiovascular: Normal rate, regular rhythm and normal heart sounds.  Exam reveals no gallop and no friction rub.   No murmur heard. Pulmonary/Chest: Effort normal. She has decreased breath sounds in the right middle field and the right lower field. She has no wheezes. She has no rhonchi. She has no rales.  Musculoskeletal: She exhibits no edema.  Lymphadenopathy:    She has no cervical adenopathy.  Neurological: She is alert and oriented to person, place, and time. Gait normal.  Skin: Skin is warm and dry.     ASSESSMENT and PLAN 1. Hospital discharge follow-up Documents, laboratory and imaging reviewed.  2. Community acquired pneumonia, unspecified laterality Has completed abx treatment, resolving. RTC precautions discussed.  3. Near  syncope Workup negative, seems to have been related to acute illness.  4. Cigarette smoker Strongly encouraged to consider trying to quit at this time. Consider medication assistance if needed.   Other orders - benzonatate (TESSALON) 100 MG capsule; Take 1 capsule (100 mg total) by mouth 3 (three) times daily as needed for cough.  Return in about 4 weeks (around 10/30/2017) for PCP - COPD/smoking.    Rutherford Guys, MD Primary Care at [facility] [place], [place] [address] Ph.  [phone] Fax [phone]

## 2017-10-06 ENCOUNTER — Ambulatory Visit: Payer: Medicare Other | Admitting: Cardiovascular Disease

## 2017-10-25 ENCOUNTER — Other Ambulatory Visit: Payer: Self-pay | Admitting: Physician Assistant

## 2017-10-25 DIAGNOSIS — F5105 Insomnia due to other mental disorder: Principal | ICD-10-CM

## 2017-10-25 DIAGNOSIS — F418 Other specified anxiety disorders: Secondary | ICD-10-CM

## 2017-10-26 NOTE — Telephone Encounter (Signed)
Rx  Refill  Request

## 2017-10-27 NOTE — Telephone Encounter (Signed)
  Rx sent electronically.  Meds ordered this encounter  Medications  . ALPRAZolam (XANAX) 0.5 MG tablet    Sig: TAKE 1 TABLET BY MOUTH AT BEDTIME    Dispense:  30 tablet    Refill:  0    Not to exceed 4 additional fills before 03/27/2018.  . nortriptyline (PAMELOR) 50 MG capsule    Sig: TAKE 2 CAPSULES BY MOUTH NIGHTLY    Dispense:  60 capsule    Refill:  5    DX Code Needed  .

## 2017-11-25 ENCOUNTER — Other Ambulatory Visit: Payer: Self-pay | Admitting: Family Medicine

## 2017-11-25 ENCOUNTER — Other Ambulatory Visit: Payer: Self-pay | Admitting: Physician Assistant

## 2017-11-25 DIAGNOSIS — F5105 Insomnia due to other mental disorder: Principal | ICD-10-CM

## 2017-11-25 DIAGNOSIS — F418 Other specified anxiety disorders: Secondary | ICD-10-CM

## 2017-11-25 NOTE — Telephone Encounter (Signed)
Xanax refill. Last refill on 10/27/17

## 2017-11-27 ENCOUNTER — Other Ambulatory Visit: Payer: Self-pay | Admitting: Physician Assistant

## 2017-11-27 DIAGNOSIS — F418 Other specified anxiety disorders: Secondary | ICD-10-CM

## 2017-11-29 NOTE — Telephone Encounter (Signed)
Please contact this patient. At her last visit with Judson Roch, the Depakote dose was increased from 500 mg to 750 mg. There is also an question as to whether she is taking it QD or BID. When she was recently hospitalized, she indicated that she didn't tolerate it, and it was put on her allergy list (though it appears she reported that BID dose caused drowsiness).  What dose and how often is she CURRENTLY taking Depakote?  I will authorize THAT to last until she can get in to see Sarah. Please help her schedule a visit.

## 2017-12-03 ENCOUNTER — Other Ambulatory Visit: Payer: Self-pay | Admitting: Family Medicine

## 2017-12-03 ENCOUNTER — Other Ambulatory Visit: Payer: Self-pay | Admitting: Physician Assistant

## 2017-12-03 DIAGNOSIS — J449 Chronic obstructive pulmonary disease, unspecified: Secondary | ICD-10-CM

## 2017-12-03 DIAGNOSIS — R600 Localized edema: Secondary | ICD-10-CM

## 2017-12-03 NOTE — Telephone Encounter (Signed)
LM to call back.

## 2017-12-07 ENCOUNTER — Telehealth: Payer: Self-pay | Admitting: Physician Assistant

## 2017-12-07 DIAGNOSIS — F418 Other specified anxiety disorders: Secondary | ICD-10-CM

## 2017-12-07 NOTE — Telephone Encounter (Signed)
Copied from [place]. Topic: Quick Communication - See Telephone Encounter >> Dec 07, 2017  2:08 PM Vernona Rieger wrote: CRM for notification. See Telephone encounter for:   12/07/17.  divalproex (DEPAKOTE ER) 500 MG 24 hr tablet  [id number] - [place],  - [address] AT [place]

## 2017-12-08 MED ORDER — DIVALPROEX SODIUM ER 500 MG PO TB24: ORAL_TABLET | ORAL | 0 refills | Status: DC

## 2017-12-08 NOTE — Telephone Encounter (Signed)
Meds ordered this encounter  Medications  . divalproex (DEPAKOTE ER) 500 MG 24 hr tablet    Sig: Take 500 mg by mouth at bedtime    Dispense:  90 tablet    Refill:  0    Order Specific Question:   Supervising Provider    Answer:   Brigitte Pulse, EVA N [[id number]]

## 2017-12-08 NOTE — Telephone Encounter (Signed)
Please see note below. 

## 2017-12-08 NOTE — Telephone Encounter (Signed)
Pt called in to provide dosage and frequency.  Pt says that she takes 500MG  1 time a day.

## 2017-12-08 NOTE — Telephone Encounter (Signed)
LM for patient to call back...need to know the dosage and how often the pt takes her depakote a day.

## 2017-12-08 NOTE — Telephone Encounter (Signed)
Pt requesting refill on Depakote ER.

## 2017-12-22 ENCOUNTER — Other Ambulatory Visit: Payer: Self-pay | Admitting: Physician Assistant

## 2017-12-22 DIAGNOSIS — F5105 Insomnia due to other mental disorder: Principal | ICD-10-CM

## 2017-12-22 DIAGNOSIS — F418 Other specified anxiety disorders: Secondary | ICD-10-CM

## 2017-12-22 NOTE — Telephone Encounter (Signed)
Medication refill. Last office visit 10/02/17. Thanks.

## 2017-12-23 NOTE — Telephone Encounter (Signed)
Done

## 2017-12-30 ENCOUNTER — Other Ambulatory Visit: Payer: Self-pay | Admitting: Physician Assistant

## 2017-12-31 NOTE — Telephone Encounter (Signed)
Nystatin suspension refill Last OV: 10/02/17 Last Refill:09/25/17 Pharmacy:[facility] [address] [name], [place]

## 2018-01-01 ENCOUNTER — Ambulatory Visit: Payer: Self-pay | Admitting: Family Medicine

## 2018-01-02 ENCOUNTER — Ambulatory Visit (INDEPENDENT_AMBULATORY_CARE_PROVIDER_SITE_OTHER): Payer: Medicare Other | Admitting: Family Medicine

## 2018-01-02 ENCOUNTER — Encounter: Payer: Self-pay | Admitting: Family Medicine

## 2018-01-02 ENCOUNTER — Other Ambulatory Visit: Payer: Self-pay

## 2018-01-02 ENCOUNTER — Ambulatory Visit (INDEPENDENT_AMBULATORY_CARE_PROVIDER_SITE_OTHER): Payer: Medicare Other

## 2018-01-02 VITALS — BP 130/80 | HR 88 | Temp 97.3°F | Resp 18 | Ht 62.99 in | Wt 126.6 lb

## 2018-01-02 DIAGNOSIS — R600 Localized edema: Secondary | ICD-10-CM | POA: Diagnosis not present

## 2018-01-02 DIAGNOSIS — M546 Pain in thoracic spine: Secondary | ICD-10-CM

## 2018-01-02 DIAGNOSIS — Z981 Arthrodesis status: Secondary | ICD-10-CM | POA: Diagnosis not present

## 2018-01-02 MED ORDER — BENZONATATE 100 MG PO CAPS: 100.0000 mg | ORAL_CAPSULE | Freq: Three times a day (TID) | ORAL | 0 refills | Status: DC | PRN

## 2018-01-02 MED ORDER — NYSTATIN 100000 UNIT/ML MT SUSP: 5.0000 mL | Freq: Four times a day (QID) | OROMUCOSAL | 0 refills | Status: DC

## 2018-01-02 MED ORDER — HYDROCODONE-ACETAMINOPHEN 5-325 MG PO TABS: 0.5000 | ORAL_TABLET | Freq: Three times a day (TID) | ORAL | 0 refills | Status: DC | PRN

## 2018-01-02 MED ORDER — SPIRONOLACTONE 50 MG PO TABS: 50.0000 mg | ORAL_TABLET | Freq: Every day | ORAL | 0 refills | Status: DC

## 2018-01-02 NOTE — Patient Instructions (Signed)
     IF you received an x-ray today, you will receive an invoice from [facility]. Please contact [facility] at [phone] with questions or concerns regarding your invoice.   IF you received labwork today, you will receive an invoice from [facility]. Please contact [facility] at [phone] with questions or concerns regarding your invoice.   Our billing staff will not be able to assist you with questions regarding bills from these companies.  You will be contacted with the lab results as soon as they are available. The fastest way to get your results is to activate your My Chart account. Instructions are located on the last page of this paperwork. If you have not heard from us regarding the results in 2 weeks, please contact this office.     

## 2018-01-02 NOTE — Progress Notes (Signed)
2/2/201911:54 AM  Catherine Kelley Jul 27, 1951, 67 y.o. female [id number]  Chief Complaint  Patient presents with  . Back Pain    x2 weeks, mid to lower back, Pt states back has become more intense over the last week. Pt states pain is keep her up at night.    . Medication Refill    Tessalon 100 MG, NORCO, Nystatin, Spironolactine 50 MG    HPI:   Patient is a 67 y.o. female with past medical history significant for COPD, recent PNA, known lumbar DDD who presents today for new onset of thoracic back pain, started about 2 weeks ago, in the middle, vicodin provides partial relief, difficult to sleep due to pain.  She reports that coughing continues to improve, breathing back to baseline. Denies any fever or chills.   Requesting refills of meds Nystatin as she sometimes gets a little bit of thrush from her copd inhalers Spironolactone started by PCP in oct for LE edema, resolved with medication, doing well, no side effects   Known DDD of lumbar spine, last xrays done 08/2017 PMP reviewed appropriate Uses vicodin very prn, last rx for 15 tabs was given in early oct  Depression screen [facility] 2/9 01/02/2018 10/02/2017 09/04/2017  Decreased Interest 0 0 0  Down, Depressed, Hopeless 0 0 0  PHQ - 2 Score 0 0 0    Allergies  Allergen Reactions  . Sulfa Drugs Cross Reactors Hives and Rash    Hives and rash  . Ceclor [Cefaclor] Hives    Tolerated ceftazidime December 2016  . Depakote Er [Divalproex Sodium Er] Other (See Comments)    Patient remarked that when she was taking this TWICE a day, she became "clumsy" and felt more prone to stumbling  . Escitalopram Oxalate Other (See Comments)    Pt does not recall ever taking medication (??)  . Sertraline Hcl     Severe headache    Prior to Admission medications   Medication Sig Start Date End Date Taking? Authorizing Provider  albuterol (PROVENTIL HFA;VENTOLIN HFA) 108 (90 Base) MCG/ACT inhaler Inhale 2 puffs into the lungs every 6 (six) hours  as needed for wheezing or shortness of breath. 09/19/17  Yes Patrecia Pour, Christean Grief, MD  albuterol (PROVENTIL) (2.5 MG/3ML) 0.083% nebulizer solution USE 1 VIAL IN NEBULIZER EVERY 6 HOURS AS NEEDED FOR SHORTNESS OF BREATH 12/04/17  Yes Rutherford Guys, MD  ALPRAZolam Duanne Moron) 0.5 MG tablet TAKE 1 TABLET BY MOUTH EVERYDAY AT BEDTIME 12/23/17  Yes Weber, Damaris Hippo, PA-C  aspirin EC 81 MG tablet Take 81-162 mg by mouth See admin instructions. 81 mg once a day "for heart health" and 162 mg one to two times a day as needed for headaches   Yes [provider]  budesonide-formoterol (SYMBICORT) 160-4.5 MCG/ACT inhaler Inhale 2 puffs into the lungs 2 (two) times daily. 09/19/17  Yes Patrecia Pour, Christean Grief, MD  calcium carbonate (TUMS EX) 750 MG chewable tablet Chew 1 tablet by mouth 2 (two) times daily as needed for heartburn.   Yes [provider]  divalproex (DEPAKOTE ER) 500 MG 24 hr tablet Take 500 mg by mouth at bedtime 12/08/17  Yes Jeffery, Chelle, PA-C  fluticasone (FLONASE) 50 MCG/ACT nasal spray Place 2 sprays into both nostrils daily. 09/19/17  Yes Patrecia Pour, Christean Grief, MD  nortriptyline (PAMELOR) 50 MG capsule TAKE 2 CAPSULES BY MOUTH NIGHTLY 10/27/17  Yes Jeffery, Chelle, PA-C  nystatin (MYCOSTATIN) 100000 UNIT/ML suspension TAKE 5 MLS (500,000 UNITS TOTAL) BY MOUTH 4 (FOUR)  TIMES DAILY. 09/25/17  Yes Weber, Damaris Hippo, PA-C  spironolactone (ALDACTONE) 50 MG tablet Take 1 tablet (50 mg total) by mouth daily. Office visit needed for refills 12/03/17  Yes Weber, Sarah L, PA-C  triamcinolone cream (KENALOG) 0.1 % Apply 1 application topically 2 (two) times daily. Patient taking differently: Apply 1 application topically 2 (two) times daily as needed (for irritation).  02/07/17  Yes Forrest Moron, MD  benzonatate (TESSALON) 100 MG capsule Take 1 capsule (100 mg total) by mouth 3 (three) times daily as needed for cough. Patient not taking: Reported on 01/02/2018 10/02/17   Rutherford Guys, MD    HYDROcodone-acetaminophen (NORCO/VICODIN) 5-325 MG tablet Take 0.5-1 tablets by mouth every 8 (eight) hours as needed for moderate pain. Patient not taking: Reported on 01/02/2018 09/04/17   Gale Journey, Damaris Hippo, PA-C  HYDROcodone-homatropine [facility]) 5-1.5 MG/5ML syrup Take 5 mLs by mouth every 4 (four) hours as needed for cough. Patient not taking: Reported on 01/02/2018 09/19/17   Doreatha Lew, MD    Past Medical History:  Diagnosis Date  . Anxiety   . Arthritis    "jooints" (09/18/2017)  . Asthma   . Bipolar disorder ([place])   . CAP (community acquired pneumonia) 09/18/2017  . Cat allergies   . Chronic bronchitis ([place])    "get it alot; maybe not q yr" (07/06/2014)  . Chronic lower back pain    "spurs and pinched nerves" (09/18/2017)  . COPD (chronic obstructive pulmonary disease) ([place])   . DDD (degenerative disc disease), lumbar   . Depression    psychiatrist Dr. Toy Care  . Diastolic dysfunction   . Emphysematous COPD ([place])    changes in right base along with patchy areas on last x-ray  . Falls frequently    "several in 2017; fell at dr's office yesterday" (09/18/2017)  . GERD (gastroesophageal reflux disease)   . HCAP (healthcare-associated pneumonia) 02/12/2016  . Heart murmur   . History of cardiovascular stress test 08/15/2004   EF of 70% -- Normal stress cardiolite.  There is no evidence of ischemia and there is normal LV function. -- [name]. MD  . History of echocardiogram 12/24/2006   Est. EF of 40-34% NORMAL LV SYSTOLIC FUNCTION WITH IMPAIRED RELAXATION -- MILD AORTIC SCLEROSIS -- NORMAL PALONARY ARTERY PRESSURE -- NO OLD ECHOS FOR COMPARISON -- [name], MD  . Hypertension    essential hypertension  . Migraine    "when I was having my periods; none since then" (09/18/2017)  . Necrotic pneumonia ([place])    recurrent/notes 02/12/2016  . Pneumonia    "several times since May 2015" (07/06/2014)  . Pollen allergies   . Tobacco abuse    smoking about 1/2 pk of  cigarettes a day    Past Surgical History:  Procedure Laterality Date  . DILATION AND CURETTAGE OF UTERUS    . TUBAL LIGATION  1986    Social History   Tobacco Use  . Smoking status: Current Every Day Smoker    Packs/day: 0.50    Years: 46.00    Pack years: 23.00    Types: Cigarettes  . Smokeless tobacco: Never Used  Substance Use Topics  . Alcohol use: Yes    Alcohol/week: 0.0 oz    Comment: 09/18/2017 "might have 1 drink/month; if that"    Family History  Problem Relation Age of Onset  . Stroke Mother   . Heart disease Father   . Hyperlipidemia Father   . Hypertension Father   .  Breast cancer Maternal Aunt   . Asthma Maternal Grandmother     ROS Per hpi  OBJECTIVE:  Blood pressure 130/80, pulse 88, temperature (!) 97.3 F (36.3 C), temperature source Oral, resp. rate 18, height 5' 2.99" (1.6 m), weight 126 lb 9.6 oz (57.4 kg), SpO2 93 %.  Physical Exam  Constitutional: She is oriented to person, place, and time and well-developed, well-nourished, and in no distress.  HENT:  Head: Normocephalic and atraumatic.  Mouth/Throat: Oropharynx is clear and moist. No oropharyngeal exudate.  Eyes: EOM are normal. Pupils are equal, round, and reactive to light. No scleral icterus.  Neck: Neck supple.  Cardiovascular: Normal rate, regular rhythm and normal heart sounds. Exam reveals no gallop and no friction rub.  No murmur heard. Pulmonary/Chest: Effort normal. She has decreased breath sounds (diffuse with mild ronchi). She has no wheezes. She has no rales.  Musculoskeletal: She exhibits no edema.       Thoracic back: She exhibits bony tenderness (point TTP ~ T8) and deformity (kyphosis). She exhibits no spasm.  Neurological: She is alert and oriented to person, place, and time. Gait normal.  Skin: Skin is warm and dry.    Dg Thoracic Spine 2 View  Result Date: 01/02/2018 CLINICAL DATA:  New onset kyphosis.  Recent pneumonia. EXAM: THORACIC SPINE 2 VIEWS COMPARISON:   Chest radiograph 09/17/2017 FINDINGS: Osteopenia. Kyphotic deformity of the thoracic spine. Upper thoracic spine obscured by arm shadows. No evidence of acute fracture in the visualized portion of the thoracic spine. Multilevel osteoarthritic changes of the thoracic spine. IMPRESSION: Incomplete visualization of the upper thoracic spine, obscured by arm shadows. No evidence of acute fracture in the visualized spine. Kyphotic deformity, not significantly changed from prior chest radiograph. Electronically Signed   By: Fidela Salisbury M.D.   On: 01/02/2018 12:40     ASSESSMENT and PLAN  1. Acute midline thoracic back pain - DG Thoracic Spine 2 View; Future  2. Bilateral edema of lower extremity - spironolactone (ALDACTONE) 50 MG tablet; Take 1 tablet (50 mg total) by mouth daily. Office visit needed for refills  Other orders - benzonatate (TESSALON) 100 MG capsule; Take 1 capsule (100 mg total) by mouth 3 (three) times daily as needed for cough. - nystatin (MYCOSTATIN) 100000 UNIT/ML suspension; Take 5 mLs (500,000 Units total) by mouth 4 (four) times daily. - HYDROcodone-acetaminophen (NORCO/VICODIN) 5-325 MG tablet; Take 0.5-1 tablets by mouth every 8 (eight) hours as needed for moderate pain.  Return if symptoms worsen or fail to improve.    Rutherford Guys, MD Primary Care at [facility] [place], [place] [address] Ph.  [phone] Fax [phone]

## 2018-01-16 ENCOUNTER — Encounter: Payer: Self-pay | Admitting: Physician Assistant

## 2018-01-16 ENCOUNTER — Other Ambulatory Visit: Payer: Self-pay

## 2018-01-16 ENCOUNTER — Ambulatory Visit: Payer: Self-pay | Admitting: Physician Assistant

## 2018-01-16 ENCOUNTER — Ambulatory Visit (INDEPENDENT_AMBULATORY_CARE_PROVIDER_SITE_OTHER): Payer: Medicare Other

## 2018-01-16 ENCOUNTER — Ambulatory Visit (INDEPENDENT_AMBULATORY_CARE_PROVIDER_SITE_OTHER): Payer: Medicare Other | Admitting: Physician Assistant

## 2018-01-16 VITALS — BP 142/83 | HR 94 | Temp 97.7°F | Resp 16 | Ht 63.0 in | Wt 125.0 lb

## 2018-01-16 DIAGNOSIS — M25511 Pain in right shoulder: Secondary | ICD-10-CM | POA: Diagnosis not present

## 2018-01-16 DIAGNOSIS — S40021A Contusion of right upper arm, initial encounter: Secondary | ICD-10-CM | POA: Diagnosis not present

## 2018-01-16 DIAGNOSIS — M62838 Other muscle spasm: Secondary | ICD-10-CM

## 2018-01-16 DIAGNOSIS — L853 Xerosis cutis: Secondary | ICD-10-CM | POA: Diagnosis not present

## 2018-01-16 DIAGNOSIS — S4991XA Unspecified injury of right shoulder and upper arm, initial encounter: Secondary | ICD-10-CM | POA: Diagnosis not present

## 2018-01-16 DIAGNOSIS — L03818 Cellulitis of other sites: Secondary | ICD-10-CM

## 2018-01-16 DIAGNOSIS — M79621 Pain in right upper arm: Secondary | ICD-10-CM | POA: Diagnosis not present

## 2018-01-16 DIAGNOSIS — S40011A Contusion of right shoulder, initial encounter: Secondary | ICD-10-CM | POA: Diagnosis not present

## 2018-01-16 MED ORDER — TIZANIDINE HCL 2 MG PO TABS: 2.0000 mg | ORAL_TABLET | Freq: Every day | ORAL | 0 refills | Status: DC

## 2018-01-16 MED ORDER — TRIAMCINOLONE ACETONIDE 0.1 % EX CREA: 1.0000 "application " | TOPICAL_CREAM | Freq: Two times a day (BID) | CUTANEOUS | 0 refills | Status: DC

## 2018-01-16 MED ORDER — CEPHALEXIN 500 MG PO CAPS: 500.0000 mg | ORAL_CAPSULE | Freq: Three times a day (TID) | ORAL | 0 refills | Status: AC

## 2018-01-16 NOTE — Patient Instructions (Addendum)
Please do not use Xanax and hydrocodone at the same time.  Please try to reduce the amount of zanaflex that you take -- I would rather you take this though compared to the Xanax    IF you received an x-ray today, you will receive an invoice from [facility]. Please contact [facility] at [phone] with questions or concerns regarding your invoice.   IF you received labwork today, you will receive an invoice from [place]. Please contact [facility] at [phone] with questions or concerns regarding your invoice.   Our billing staff will not be able to assist you with questions regarding bills from these companies.  You will be contacted with the lab results as soon as they are available. The fastest way to get your results is to activate your My Chart account. Instructions are located on the last page of this paperwork. If you have not heard from [name] regarding the results in 2 weeks, please contact this office.

## 2018-01-16 NOTE — Progress Notes (Signed)
Catherine Kelley  MRN: [id number] DOB: 11-16-1951  PCP: Mancel Bale, PA-C  Chief Complaint  Patient presents with  . fell    "while sleep fell out of bed", injured right arm hurts to use, certain movement.  . Shoulder    right,  injury with fall  . Medication Refill    Triamcinolone cream (Kenalog) 0.1%, Proventil HFA, albuterol inhaler    Subjective:  Pt presents to clinic for right arm pain.  Last week she was sleeping and she rolled over and got wrapped in her covers and ended up falling out of bed.  She did not hit her head but she did land on her right upper arm/shoulder and she has had problems since then.  Mostly it is her upper arm and shoulder with movements - not her elbow or wrist.  She is having trouble moving her arm in front of her.  She is also having a lot of problems with dry skin though this is not related to the fall.  She has been using lotion on her wrists but she has noticed over the last couple of days they are getting more red and warmer.  She finds that the zanaflex helps her get rest at night.  She has been using hydrocodone at night to sleep due to the right arm pain.  History is obtained by patient.  Review of Systems  Musculoskeletal: Negative for neck pain.  Skin: Positive for color change and rash.  Psychiatric/Behavioral: Positive for sleep disturbance.    Patient Active Problem List   Diagnosis Date Noted  . CAP (community acquired pneumonia) 09/17/2017  . Near syncope 09/17/2017  . Tobacco abuse 02/12/2016  . Other fatigue 02/04/2016  . Respiratory failure with hypoxia ([place]) 01/09/2016  . Malnutrition of moderate degree 11/03/2015  . Pulmonary emphysema ([place])   . Essential hypertension 09/27/2015  . Rhabdomyolysis 08/16/2015  . Chronic cough 06/07/2015  . Bronchiectasis with acute exacerbation ([place]) 03/07/2015  . Cigarette smoker 12/13/2014  . COPD GOLD II/ still smoking 10/11/2014  . Lumbar back pain with radiculopathy affecting right  lower extremity 09/28/2014  . Hyponatremia 07/09/2014  . Hx of recurrent pneumonia 07/08/2014  . Venous insufficiency (chronic) (peripheral) 09/29/2012  . Lower extremity edema 03/09/2012  . Benign hypertensive heart disease without heart failure 05/09/2011  . Chest pain 05/09/2011  . Dyslipidemia 05/09/2011  . Mood disorder ([place]) 05/09/2011  . Osteoarthritis 05/09/2011    Current Outpatient Medications on File Prior to Visit  Medication Sig Dispense Refill  . albuterol (PROVENTIL) (2.5 MG/3ML) 0.083% nebulizer solution USE 1 VIAL IN NEBULIZER EVERY 6 HOURS AS NEEDED FOR SHORTNESS OF BREATH 150 mL 0  . ALPRAZolam (XANAX) 0.5 MG tablet TAKE 1 TABLET BY MOUTH EVERYDAY AT BEDTIME 30 tablet 0  . aspirin EC 81 MG tablet Take 81-162 mg by mouth See admin instructions. 81 mg once a day "for heart health" and 162 mg one to two times a day as needed for headaches    . benzonatate (TESSALON) 100 MG capsule Take 1 capsule (100 mg total) by mouth 3 (three) times daily as needed for cough. 20 capsule 0  . calcium carbonate (TUMS EX) 750 MG chewable tablet Chew 1 tablet by mouth 2 (two) times daily as needed for heartburn.    . divalproex (DEPAKOTE ER) 500 MG 24 hr tablet Take 500 mg by mouth at bedtime 90 tablet 0  . HYDROcodone-acetaminophen (NORCO/VICODIN) 5-325 MG tablet Take 0.5-1 tablets by mouth every 8 (eight)  hours as needed for moderate pain. 15 tablet 0  . nortriptyline (PAMELOR) 50 MG capsule TAKE 2 CAPSULES BY MOUTH NIGHTLY 60 capsule 5  . spironolactone (ALDACTONE) 50 MG tablet Take 1 tablet (50 mg total) by mouth daily. Office visit needed for refills 90 tablet 0  . albuterol (PROVENTIL HFA;VENTOLIN HFA) 108 (90 Base) MCG/ACT inhaler Inhale 2 puffs into the lungs every 6 (six) hours as needed for wheezing or shortness of breath. (Patient not taking: Reported on 01/16/2018) 1 Inhaler 0  . budesonide-formoterol (SYMBICORT) 160-4.5 MCG/ACT inhaler Inhale 2 puffs into the lungs 2 (two) times  daily. (Patient not taking: Reported on 01/16/2018) 1 Inhaler 12  . fluticasone (FLONASE) 50 MCG/ACT nasal spray Place 2 sprays into both nostrils daily. (Patient not taking: Reported on 01/16/2018) 16 g 0   No current facility-administered medications on file prior to visit.     Allergies  Allergen Reactions  . Sulfa Drugs Cross Reactors Hives and Rash    Hives and rash  . Ceclor [Cefaclor] Hives    Tolerated ceftazidime December 2016  . Depakote Er [Divalproex Sodium Er] Other (See Comments)    Patient remarked that when she was taking this TWICE a day, she became "clumsy" and felt more prone to stumbling  . Escitalopram Oxalate Other (See Comments)    Pt does not recall ever taking medication (??)  . Sertraline Hcl     Severe headache    Past Medical History:  Diagnosis Date  . Anxiety   . Arthritis    "jooints" (09/18/2017)  . Asthma   . Bipolar disorder ([place])   . CAP (community acquired pneumonia) 09/18/2017  . Cat allergies   . Chronic bronchitis ([place])    "get it alot; maybe not q yr" (07/06/2014)  . Chronic lower back pain    "spurs and pinched nerves" (09/18/2017)  . COPD (chronic obstructive pulmonary disease) ([place])   . DDD (degenerative disc disease), lumbar   . Depression    psychiatrist Dr. Toy Care  . Diastolic dysfunction   . Emphysematous COPD ([place])    changes in right base along with patchy areas on last x-ray  . Falls frequently    "several in 2017; fell at dr's office yesterday" (09/18/2017)  . GERD (gastroesophageal reflux disease)   . HCAP (healthcare-associated pneumonia) 02/12/2016  . Heart murmur   . History of cardiovascular stress test 08/15/2004   EF of 70% -- Normal stress cardiolite.  There is no evidence of ischemia and there is normal LV function. -- [name]. MD  . History of echocardiogram 12/24/2006   Est. EF of 40-34% NORMAL LV SYSTOLIC FUNCTION WITH IMPAIRED RELAXATION -- MILD AORTIC SCLEROSIS -- NORMAL PALONARY ARTERY PRESSURE -- NO  OLD ECHOS FOR COMPARISON -- [name], MD  . Hypertension    essential hypertension  . Migraine    "when I was having my periods; none since then" (09/18/2017)  . Necrotic pneumonia ([place])    recurrent/notes 02/12/2016  . Pneumonia    "several times since May 2015" (07/06/2014)  . Pollen allergies   . Tobacco abuse    smoking about 1/2 pk of cigarettes a day   Social History   Social History Narrative   Patient currently lives with her husband.    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 No bird or hot tub exposure. No mold  exposure. No recent travel.   Social History   Tobacco Use  . Smoking status: Current Every Day Smoker    Packs/day: 0.50    Years: 46.00    Pack years: 23.00    Types: Cigarettes  . Smokeless tobacco: Never Used  Substance Use Topics  . Alcohol use: Yes    Alcohol/week: 0.0 oz    Comment: 09/18/2017 "might have 1 drink/month; if that"  . Drug use: No   family history includes Asthma in her maternal grandmother; Breast cancer in her maternal aunt; Heart disease in her father; Hyperlipidemia in her father; Hypertension in her father; Stroke in her mother.     Objective:  BP (!) 142/83   Pulse 94   Temp 97.7 F (36.5 C) (Oral)   Resp 16   Ht 5\' 3"  (1.6 m) Comment: with shoes  Wt 125 lb (56.7 kg)   SpO2 93%   BMI 22.14 kg/m  Body mass index is 22.14 kg/m.  Physical Exam  Constitutional: She is oriented to person, place, and time and well-developed, well-nourished, and in no distress.  HENT:  Head: Normocephalic and atraumatic.  Right Ear: Hearing and external ear normal.  Left Ear: Hearing and external ear normal.  Eyes: Conjunctivae are normal.  Neck: Normal range of motion.  Pulmonary/Chest: Effort normal.  Musculoskeletal:       Right shoulder: She exhibits decreased range of motion (ok abduction, decrease shoulder extension and anterior  cross over is reduced) and tenderness (proximal humerus).       Arms: Good distal pulses, good strength  Neurological: She is alert and oriented to person, place, and time. Gait normal.  Skin: Skin is warm and dry. There is erythema.  Dry skin bilateral wrist - R>L with erythema and warmth that is around the wrist   Psychiatric: Mood, memory, affect and judgment normal.  Vitals reviewed.  Dg Shoulder Right  Result Date: 01/16/2018 CLINICAL DATA:  Pain after trauma last week. EXAM: RIGHT SHOULDER - 2+ VIEW COMPARISON:  None. FINDINGS: There is no evidence of fracture or dislocation. There is no evidence of arthropathy or other focal bone abnormality. Soft tissues are unremarkable. IMPRESSION: Negative. Electronically Signed   By: Dorise Bullion III M.D   On: 01/16/2018 14:57   Dg Humerus Right  Result Date: 01/16/2018 CLINICAL DATA:  Pain after trauma 7 days ago. EXAM: RIGHT HUMERUS - 2+ VIEW COMPARISON:  None. FINDINGS: There is no evidence of fracture or other focal bone lesions. Soft tissues are unremarkable. IMPRESSION: Negative. Electronically Signed   By: Dorise Bullion III M.D   On: 01/16/2018 14:57    Assessment and Plan :  Acute pain of right shoulder - Plan: DG Shoulder Right, DG Humerus Right  Cellulitis of other specified site - Plan: cephALEXin (KEFLEX) 500 MG capsule - start abx and watch for improvement  Dry skin - Plan: triamcinolone cream (KENALOG) 0.1 % - continue lotion and use this as needed  Muscle spasm - Plan: tiZANidine (ZANAFLEX) 2 MG tablet  Contusion of multiple sites of right shoulder and upper arm, initial encounter - use due to pain from the fall -   please do not use this and the xanax - pt has hydrocodone at home she should not use pain medication and xanax at the same time.  Windell Hummingbird PA-C  [facility] 01/16/2018 3:10 PM

## 2018-01-26 ENCOUNTER — Other Ambulatory Visit: Payer: Self-pay | Admitting: Physician Assistant

## 2018-01-26 DIAGNOSIS — F418 Other specified anxiety disorders: Secondary | ICD-10-CM

## 2018-01-26 DIAGNOSIS — F5105 Insomnia due to other mental disorder: Principal | ICD-10-CM

## 2018-01-26 MED ORDER — ALPRAZOLAM 0.5 MG PO TABS: ORAL_TABLET | ORAL | 0 refills | Status: DC

## 2018-01-26 NOTE — Telephone Encounter (Signed)
Copied from [place]. Topic: Inquiry >> Jan 26, 2018  1:35 PM Pricilla Handler wrote: Reason for CRM: Patient called requesting a refill of ALPRAZolam (XANAX) 0.5 MG tablet. Patient has contacted the pharmacy. Patient's preferred pharmacy is [id number] [name], [place] [phone] (Phone) [phone] (Fax). Patient states that she is almost completely out.

## 2018-01-26 NOTE — Telephone Encounter (Signed)
Refill of Xanax  LOV 01/16/18  S.Weber PA    [id number] [name], [place]            [phone] (Phone) [phone] (Fax).

## 2018-01-26 NOTE — Telephone Encounter (Signed)
Patient is requesting a refill of the following medications: Requested Prescriptions   Pending Prescriptions Disp Refills  . ALPRAZolam (XANAX) 0.5 MG tablet 30 tablet 0    Date of patient request: 01/26/18  Last office visit: 01/16/18  Date of last refill: 12/23/17 Last refill amount: #30, no refills Follow up time period per chart: Unable to find

## 2018-02-10 ENCOUNTER — Other Ambulatory Visit: Payer: Self-pay | Admitting: Physician Assistant

## 2018-02-10 DIAGNOSIS — M62838 Other muscle spasm: Secondary | ICD-10-CM

## 2018-02-15 ENCOUNTER — Telehealth: Payer: Self-pay | Admitting: Physician Assistant

## 2018-02-15 ENCOUNTER — Other Ambulatory Visit: Payer: Self-pay | Admitting: *Deleted

## 2018-02-15 DIAGNOSIS — M62838 Other muscle spasm: Secondary | ICD-10-CM

## 2018-02-15 DIAGNOSIS — J449 Chronic obstructive pulmonary disease, unspecified: Secondary | ICD-10-CM

## 2018-02-15 MED ORDER — ALBUTEROL SULFATE (2.5 MG/3ML) 0.083% IN NEBU: INHALATION_SOLUTION | RESPIRATORY_TRACT | 0 refills | Status: DC

## 2018-02-15 NOTE — Telephone Encounter (Signed)
Pt request zanaflex to be increased to 4 mg; LOV 01/16/18 with Windell Hummingbird.

## 2018-02-15 NOTE — Telephone Encounter (Signed)
Copied from [place] [id number]. Topic: Quick Communication - See Telephone Encounter >> Feb 15, 2018 10:07 AM Ahmed Prima L wrote: CRM for notification. See Telephone encounter for:   02/15/18.  Patient contacted the pharmacy for albuterol (PROVENTIL) (2.5 MG/3ML) 0.083% nebulizer solution Pharmacy has not heard anything. Please advise  tiZANidine (ZANAFLEX) 2 MG tablet (requesting 4 mg instead of 2mg ) its better for her because she is still hurting with 2mg     [id number] - [place], [place] [address]

## 2018-02-16 MED ORDER — ALBUTEROL SULFATE (2.5 MG/3ML) 0.083% IN NEBU: INHALATION_SOLUTION | RESPIRATORY_TRACT | 0 refills | Status: DC

## 2018-02-16 NOTE — Telephone Encounter (Signed)
I would have the patient make an OV to discuss next steps.

## 2018-02-19 ENCOUNTER — Encounter: Payer: Self-pay | Admitting: Physician Assistant

## 2018-02-19 ENCOUNTER — Ambulatory Visit (INDEPENDENT_AMBULATORY_CARE_PROVIDER_SITE_OTHER): Payer: Medicare Other

## 2018-02-19 ENCOUNTER — Ambulatory Visit (INDEPENDENT_AMBULATORY_CARE_PROVIDER_SITE_OTHER): Payer: Medicare Other | Admitting: Physician Assistant

## 2018-02-19 ENCOUNTER — Other Ambulatory Visit: Payer: Self-pay

## 2018-02-19 VITALS — BP 126/72 | HR 96 | Temp 97.9°F | Resp 18 | Ht 63.0 in | Wt 123.4 lb

## 2018-02-19 DIAGNOSIS — F418 Other specified anxiety disorders: Secondary | ICD-10-CM

## 2018-02-19 DIAGNOSIS — M545 Low back pain: Secondary | ICD-10-CM | POA: Diagnosis not present

## 2018-02-19 DIAGNOSIS — J449 Chronic obstructive pulmonary disease, unspecified: Secondary | ICD-10-CM

## 2018-02-19 DIAGNOSIS — F5105 Insomnia due to other mental disorder: Secondary | ICD-10-CM

## 2018-02-19 DIAGNOSIS — M48061 Spinal stenosis, lumbar region without neurogenic claudication: Secondary | ICD-10-CM | POA: Diagnosis not present

## 2018-02-19 DIAGNOSIS — L03116 Cellulitis of left lower limb: Secondary | ICD-10-CM

## 2018-02-19 DIAGNOSIS — G8929 Other chronic pain: Secondary | ICD-10-CM

## 2018-02-19 DIAGNOSIS — F39 Unspecified mood [affective] disorder: Secondary | ICD-10-CM

## 2018-02-19 DIAGNOSIS — R296 Repeated falls: Secondary | ICD-10-CM | POA: Diagnosis not present

## 2018-02-19 DIAGNOSIS — Z720 Tobacco use: Secondary | ICD-10-CM | POA: Diagnosis not present

## 2018-02-19 MED ORDER — FLUTICASONE PROPIONATE 50 MCG/ACT NA SUSP: 2.0000 | Freq: Every day | NASAL | 6 refills | Status: DC

## 2018-02-19 MED ORDER — ALBUTEROL SULFATE HFA 108 (90 BASE) MCG/ACT IN AERS: 2.0000 | INHALATION_SPRAY | Freq: Four times a day (QID) | RESPIRATORY_TRACT | 1 refills | Status: DC | PRN

## 2018-02-19 MED ORDER — ALPRAZOLAM 0.5 MG PO TABS: ORAL_TABLET | ORAL | 0 refills | Status: DC

## 2018-02-19 MED ORDER — CEPHALEXIN 500 MG PO CAPS: 500.0000 mg | ORAL_CAPSULE | Freq: Four times a day (QID) | ORAL | 0 refills | Status: DC

## 2018-02-19 MED ORDER — GABAPENTIN 100 MG PO CAPS: 100.0000 mg | ORAL_CAPSULE | Freq: Every day | ORAL | 0 refills | Status: DC

## 2018-02-19 NOTE — Patient Instructions (Addendum)
  Continue same dose of Depakote - 500mg  at night Continue same dose of xanax 0.5mg  Continue all other medications  Add gabapentin - this will help with pain and maybe help stabilize your mood  Ice to your leg for the next 2 days and then turn to heat - take all your antibiotics   IF you received an x-ray today, you will receive an invoice from [facility]. Please contact [facility] at [phone] with questions or concerns regarding your invoice.   IF you received labwork today, you will receive an invoice from [facility]. Please contact [facility] at [phone] with questions or concerns regarding your invoice.   Our billing staff will not be able to assist you with questions regarding bills from these companies.  You will be contacted with the lab results as soon as they are available. The fastest way to get your results is to activate your My Chart account. Instructions are located on the last page of this paperwork. If you have not heard from [name] regarding the results in 2 weeks, please contact this office.

## 2018-02-19 NOTE — Progress Notes (Signed)
Catherine Kelley  MRN: [id number] DOB: November 06, 1951  PCP: Mancel Bale, PA-C  Chief Complaint  Patient presents with  . Fall    tuesday morning fell has an egg on left leg and both shoulder hurts    Subjective:  Pt presents to clinic for f/u of multiple medication conditions. 1- continues to have back pain - she would like higher dose of muscle relaxer and pain medication - she hurts all the time -  2- anxiety - cannot go without her xanax at night - she tried to decrease to 0.45m and she was unable to sleep 3- falls - she has had 2 falls within the last 2 months - one she got tangled in her blankets and fell out of bed and the other was 3 nights ago and she tripped on uneven floor in the bathroom - she feels like she was not completely awake while going to the bathroom.  She has a large "goose egg" on her left shin and now some redness in her distal leg and foot.  She has done nothing to the area. 4- mood - she has felt like she has been manic during the last several weeks - she is not in full mania because she has been finishing projects and going to bed but she does not more energy than normal.  She takes depakote 5061mat night - anything more than that and she got dizzy even when the dose was split.  She has not seen psychiatry due to Dr KaToy Careot taking medicare.  Pt wants to talk about Trump mostly during her visit.  Several stories about her husband where he seems to not be nice to her but she feels safe at home.  History is obtained by patient.  Review of Systems  Musculoskeletal: Positive for back pain. Negative for gait problem.  Skin: Positive for rash and wound.  Neurological: Negative for dizziness, syncope and headaches.  Psychiatric/Behavioral: Positive for dysphoric mood and sleep disturbance. The patient is nervous/anxious.     Patient Active Problem List   Diagnosis Date Noted  . CAP (community acquired pneumonia) 09/17/2017  . Near syncope 09/17/2017  . Tobacco  abuse 02/12/2016  . Other fatigue 02/04/2016  . Respiratory failure with hypoxia ([facility]02/07/2016  . Malnutrition of moderate degree 11/03/2015  . Pulmonary emphysema ([place]  . Essential hypertension 09/27/2015  . Rhabdomyolysis 08/16/2015  . Chronic cough 06/07/2015  . Bronchiectasis with acute exacerbation ([facility]04/05/2015  . Cigarette smoker 12/13/2014  . COPD GOLD II/ still smoking 10/11/2014  . Lumbar back pain with radiculopathy affecting right lower extremity 09/28/2014  . Hyponatremia 07/09/2014  . Hx of recurrent pneumonia 07/08/2014  . Venous insufficiency (chronic) (peripheral) 09/29/2012  . Lower extremity edema 03/09/2012  . Benign hypertensive heart disease without heart failure 05/09/2011  . Chest pain 05/09/2011  . Dyslipidemia 05/09/2011  . Mood disorder (HCOak Run06/07/2011  . Osteoarthritis 05/09/2011    Current Outpatient Medications on File Prior to Visit  Medication Sig Dispense Refill  . albuterol (PROVENTIL) (2.5 MG/3ML) 0.083% nebulizer solution Albuterol nebulizer 150 mL 0  . aspirin EC 81 MG tablet Take 81-162 mg by mouth See admin instructions. 81 mg once a day "for heart health" and 162 mg one to two times a day as needed for headaches    . benzonatate (TESSALON) 100 MG capsule Take 1 capsule (100 mg total) by mouth 3 (three) times daily as needed for cough. 20 capsule 0  . budesonide-formoterol (  SYMBICORT) 160-4.5 MCG/ACT inhaler Inhale 2 puffs into the lungs 2 (two) times daily. 1 Inhaler 12  . calcium carbonate (TUMS EX) 750 MG chewable tablet Chew 1 tablet by mouth 2 (two) times daily as needed for heartburn.    . divalproex (DEPAKOTE ER) 500 MG 24 hr tablet Take 500 mg by mouth at bedtime 90 tablet 0  . HYDROcodone-acetaminophen (NORCO/VICODIN) 5-325 MG tablet Take 0.5-1 tablets by mouth every 8 (eight) hours as needed for moderate pain. 15 tablet 0  . nortriptyline (PAMELOR) 50 MG capsule TAKE 2 CAPSULES BY MOUTH NIGHTLY 60 capsule 5  . spironolactone  (ALDACTONE) 50 MG tablet Take 1 tablet (50 mg total) by mouth daily. Office visit needed for refills 90 tablet 0  . tiZANidine (ZANAFLEX) 2 MG tablet TAKE 1 TABLET (2 MG TOTAL) BY MOUTH AT BEDTIME. 30 tablet 0  . triamcinolone cream (KENALOG) 0.1 % Apply 1 application topically 2 (two) times daily. 30 g 0   No current facility-administered medications on file prior to visit.     Allergies  Allergen Reactions  . Sulfa Drugs Cross Reactors Hives and Rash    Hives and rash  . Ceclor [Cefaclor] Hives    Tolerated ceftazidime December 2016  . Depakote Er [Divalproex Sodium Er] Other (See Comments)    Patient remarked that when she was taking this TWICE a day, she became "clumsy" and felt more prone to stumbling  . Escitalopram Oxalate Other (See Comments)    Pt does not recall ever taking medication (??)  . Sertraline Hcl     Severe headache    Past Medical History:  Diagnosis Date  . Anxiety   . Arthritis    "jooints" (09/18/2017)  . Asthma   . Bipolar disorder ([place])   . CAP (community acquired pneumonia) 09/18/2017  . Cat allergies   . Chronic bronchitis ([place])    "get it alot; maybe not q yr" (07/06/2014)  . Chronic lower back pain    "spurs and pinched nerves" (09/18/2017)  . COPD (chronic obstructive pulmonary disease) ([place])   . DDD (degenerative disc disease), lumbar   . Depression    psychiatrist Dr. Toy Care  . Diastolic dysfunction   . Emphysematous COPD ([place])    changes in right base along with patchy areas on last x-ray  . Falls frequently    "several in 2017; fell at dr's office yesterday" (09/18/2017)  . GERD (gastroesophageal reflux disease)   . HCAP (healthcare-associated pneumonia) 02/12/2016  . Heart murmur   . History of cardiovascular stress test 08/15/2004   EF of 70% -- Normal stress cardiolite.  There is no evidence of ischemia and there is normal LV function. -- [name]. MD  . History of echocardiogram 12/24/2006   Est. EF of 55-60% NORMAL LV  SYSTOLIC FUNCTION WITH IMPAIRED RELAXATION -- MILD AORTIC SCLEROSIS -- NORMAL PALONARY ARTERY PRESSURE -- NO OLD ECHOS FOR COMPARISON -- [name], MD  . Hypertension    essential hypertension  . Migraine    "when I was having my periods; none since then" (09/18/2017)  . Necrotic pneumonia ([place])    recurrent/notes 02/12/2016  . Pneumonia    "several times since May 2015" (07/06/2014)  . Pollen allergies   . Tobacco abuse    smoking about 1/2 pk of cigarettes a day   Social History   Social History Narrative   Patient currently lives with her husband.    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 No bird or hot tub exposure. No mold exposure. No recent travel.   Social History   Tobacco Use  . Smoking status: Current Every Day Smoker    Packs/day: 0.50    Years: 46.00    Pack years: 23.00    Types: Cigarettes  . Smokeless tobacco: Never Used  Substance Use Topics  . Alcohol use: Yes    Alcohol/week: 0.0 oz    Comment: 09/18/2017 "might have 1 drink/month; if that"  . Drug use: No   family history includes Asthma in her maternal grandmother; Breast cancer in her maternal aunt; Heart disease in her father; Hyperlipidemia in her father; Hypertension in her father; Stroke in her mother.     Objective:  BP 126/72   Pulse 96   Temp 97.9 F (36.6 C) (Oral)   Resp 18   Ht _0  (1.6 m)   Wt 123 lb 6.4 oz (56 kg)   SpO2 96%   BMI 21.86 kg/m  Body mass index is 21.86 kg/m.  Physical Exam  Constitutional: She is oriented to person, place, and time and well-developed, well-nourished, and in no distress.  HENT:  Head: Normocephalic and atraumatic.  Right Ear: Hearing and external ear normal.  Left Ear: Hearing and external ear normal.  Eyes: Conjunctivae are normal.  Neck: Normal range of motion.  Cardiovascular: Normal rate, regular rhythm and normal heart sounds.  No murmur  heard. Pulmonary/Chest: Effort normal and breath sounds normal. She has no wheezes.  Musculoskeletal:       Cervical back: She exhibits tenderness (generalized TTP over spine). She exhibits normal range of motion.       Lumbar back: She exhibits tenderness (generalized TTP). She exhibits normal range of motion.       Legs: kyphotic  Neurological: She is alert and oriented to person, place, and time. Gait normal.  Skin: Skin is warm and dry.  Psychiatric: Mood, memory and judgment normal.  Pressured speech, tangential speech, able to answer questions but then changes subject about to Trump  Vitals reviewed.  Dg Lumbar Spine 2-3 Views  Result Date: 02/19/2018 CLINICAL DATA:  Chronic lumbago EXAM: LUMBAR SPINE - 2-3 VIEW COMPARISON:  September 04, 2017 FINDINGS: Frontal, lateral, and spot lumbosacral lateral images were obtained. There are 5 non-rib-bearing lumbar type vertebral bodies. There is lumbar levoscoliosis with rotatory component. No acute fracture evident. There is persistent 1.2 cm of anterolisthesis of L4 on L5. No other spondylolisthesis evident. There is marked disc space narrowing at L3-4, L4-5, and L5-S1. No erosive change. IMPRESSION: Scoliosis with stable spondylolisthesis at L5-S1. No fracture. Marked disc space narrowing at L3-4, L4-5, and L5-S1, also present on prior study. Electronically Signed   By: Lowella Grip III M.D.   On: 02/19/2018 11:48     Assessment and Plan :  Chronic bilateral low back pain without sciatica - Plan: DG Lumbar Spine 2-3 Views - pt has degenerative changes in her spine - likely the cause of her pain - pt request pain medication but I am concerned about her multiple different medication interactions - we will try gabapentin but she will take in the evening at dinner - this hopefully will help her pain and it may help to stabilize her mood.    Cellulitis of leg, left - Plan: cephALEXin (KEFLEX) 500 MG capsule - elevated and finish abx - watch  hematoma - some  pressure is ok but was concerned to wrap with compression due to distal swelling and now sure pt will elevated though she was encouraged to do so.  Falls frequently - Plan: ToxAssure Select,+Antidepr,UR, CMP14+EGFR - concern about falls at night - both recent falls seemed like she fell and nothing is causing the falls but I am concerned about the amount of sedating medications that patient is on at night - check labs to make sure nothing else contributing.    Mood disorder ([place]) - Plan: Valproic acid level - the last several visits patient has seemed more manic - she does not want to go off her Xanax and it is a low dose but I am worried that her likely bipolar is not well controlled on Depakote 560m and she has been unable to tolerate higher doses - question need for different mood stabilizer - pt needs psychiatry but that has been difficult with her insurance but I am going to check some of the newer clinics in town.  Not sure her TCA is helping but did not want to change multiple medications at a time esp with her current mood.  COPD GOLD II/ still smoking - Plan: albuterol (PROVENTIL HFA;VENTOLIN HFA) 108 (90 Base) MCG/ACT inhaler, fluticasone (FLONASE) 50 MCG/ACT nasal spray -   Tobacco abuse - pt not interested in quitting  Insomnia secondary to depression with anxiety - Plan: ALPRAZolam (XANAX) 0.5 MG tablet - continue - pt requests refill - This will be taken over by psychiatry once we get that referral done.  SWindell HummingbirdPA-C  [facility] 02/20/2018 7:36 AM

## 2018-02-23 ENCOUNTER — Other Ambulatory Visit: Payer: Self-pay | Admitting: Physician Assistant

## 2018-02-23 ENCOUNTER — Encounter: Payer: Self-pay | Admitting: Physician Assistant

## 2018-02-23 DIAGNOSIS — F5105 Insomnia due to other mental disorder: Principal | ICD-10-CM

## 2018-02-23 DIAGNOSIS — F418 Other specified anxiety disorders: Secondary | ICD-10-CM

## 2018-02-23 NOTE — Addendum Note (Signed)
Addended by: Mancel Bale on: 02/23/2018 08:41 AM   Modules accepted: Orders

## 2018-02-24 ENCOUNTER — Ambulatory Visit: Payer: Self-pay | Admitting: *Deleted

## 2018-02-24 NOTE — Telephone Encounter (Signed)
Mrs. Varas phoned in with complaint of left leg-outer aspect of shin, has burning and pain. She fell last week and saw Smith Robert on 3/22 for this hematoma. She reported she has not used ice as directed nor has she elevated leg daily to prevent LL edema. Reported she took one tylenol this morning. Denies behind the knee pain. She reports the swelling of the "egg" has gone down but that she has mild edema below the area, around ankle. Patient is calling to request pain medication. Instructed patient to ice and elevate several times today and tomorrow, along with tylenol or advice. If no improvement, phone back on Friday. Reviewed symptoms that she would need to seek ER care for after hours. Stated she understood.     Answer Assessment - Initial Assessment Questions 1. ONSET: "When did the pain start?"    Since her fall, she saw S. Weber 2. LOCATION: "Where is the pain located?"      Lt shin 3. PAIN: "How bad is the pain?"    (Scale 1-10; or mild, moderate, severe)   -  MILD (1-3): doesn't interfere with normal activities    -  MODERATE (4-7): interferes with normal activities (e.g., work or school) or awakens from sleep, limping    -  SEVERE (8-10): excruciating pain, unable to do any normal activities, unable to walk 7 4. WORK OR EXERCISE: "Has there been any recent work or exercise that involved this part of the body?"  no 5. CAUSE: "What do you think is causing the leg pain?"  fell last week 6. OTHER SYMPTOMS: "Do you have any other symptoms?" (e.g., chest pain, back pain, breathing difficulty, swelling, rash, fever, numbness, weakness)    Left lower leg below the goose egg on lt shin. 7. PREGNANCY: "Is there any chance you are pregnant?" "When was your last menstrual period?" no  Protocols used: LEG PAIN-A-AH

## 2018-02-24 NOTE — Telephone Encounter (Signed)
For your review per PEC.

## 2018-02-26 LAB — CMP14+EGFR
A/G RATIO: 1.1 — AB (ref 1.2–2.2)
ALBUMIN: 3.9 g/dL (ref 3.6–4.8)
ALK PHOS: 148 IU/L — AB (ref 39–117)
ALT: 18 IU/L (ref 0–32)
AST: 17 IU/L (ref 0–40)
BILIRUBIN TOTAL: 0.3 mg/dL (ref 0.0–1.2)
BUN / CREAT RATIO: 12 (ref 12–28)
BUN: 6 mg/dL — ABNORMAL LOW (ref 8–27)
CO2: 21 mmol/L (ref 20–29)
Calcium: 8.8 mg/dL (ref 8.7–10.3)
Chloride: 101 mmol/L (ref 96–106)
Creatinine, Ser: 0.52 mg/dL — ABNORMAL LOW (ref 0.57–1.00)
GFR calc non Af Amer: 99 mL/min/{1.73_m2} (ref >59)
GFR, EST AFRICAN AMERICAN: 114 mL/min/{1.73_m2} (ref >59)
GLOBULIN, TOTAL: 3.4 g/dL (ref 1.5–4.5)
GLUCOSE: 99 mg/dL (ref 65–99)
Potassium: 4.1 mmol/L (ref 3.5–5.2)
SODIUM: 138 mmol/L (ref 134–144)
Total Protein: 7.3 g/dL (ref 6.0–8.5)

## 2018-02-26 LAB — VALPROIC ACID LEVEL: Valproic Acid Lvl: 45 ug/mL — ABNORMAL LOW (ref 50–100)

## 2018-02-26 LAB — TOXASSURE SELECT,+ANTIDEPR,UR

## 2018-02-26 NOTE — Telephone Encounter (Signed)
Pt advised to keep elevated and to take motrin and tylenol for the pain.

## 2018-02-26 NOTE — Telephone Encounter (Signed)
Pt can use tylenol or motrin for her pain. She should elevate and Ice the area as instructed - agree with advice given.  If she is concerned I am happy to see her.

## 2018-02-28 ENCOUNTER — Ambulatory Visit (HOSPITAL_COMMUNITY)
Admission: EM | Admit: 2018-02-28 | Discharge: 2018-02-28 | Disposition: A | Discharge status: Home / Self Care | Payer: Medicare Other | Attending: Physician Assistant | Admitting: Physician Assistant

## 2018-02-28 ENCOUNTER — Encounter (HOSPITAL_COMMUNITY): Payer: Self-pay | Admitting: Family Medicine

## 2018-02-28 DIAGNOSIS — L03116 Cellulitis of left lower limb: Secondary | ICD-10-CM

## 2018-02-28 DIAGNOSIS — M79605 Pain in left leg: Secondary | ICD-10-CM

## 2018-02-28 MED ORDER — AMOXICILLIN-POT CLAVULANATE 875-125 MG PO TABS: 1.0000 | ORAL_TABLET | Freq: Two times a day (BID) | ORAL | 0 refills | Status: DC

## 2018-02-28 MED ORDER — DOXYCYCLINE HYCLATE 100 MG PO CAPS: 100.0000 mg | ORAL_CAPSULE | Freq: Two times a day (BID) | ORAL | 0 refills | Status: DC

## 2018-02-28 NOTE — ED Triage Notes (Signed)
Pt here for LLE pain, redness and swelling. She just finished her last abx yesterday to treat cellulitis but she has seen no improvement. She believes that it is worsening.

## 2018-02-28 NOTE — ED Provider Notes (Signed)
[facility]    CSN: [id number] Arrival date & time: 02/28/18  1253     History   Chief Complaint Chief Complaint  Patient presents with  . Leg Pain    HPI Catherine Kelley is a 67 y.o. female.   67 year old female comes in for worsening left lower extremity redness and swelling after being seen by her primary care 3/22. at that time, she had a hematoma to the left mid shin area, was put on Keflex for cellulitis.  States she finished antibiotics yesterday, but swelling and erythema has spread to the whole lower leg.  She has some chills without  known fever.  Take Tylenol for the pain.  Denies calf pain, chest pain, shortness of breath.  History of venous insufficiency, denies history of blood clots.     Past Medical History:  Diagnosis Date  . Anxiety   . Arthritis    "jooints" (09/18/2017)  . Asthma   . Bipolar disorder ([place])   . CAP (community acquired pneumonia) 09/18/2017  . Cat allergies   . Chronic bronchitis ([place])    "get it alot; maybe not q yr" (07/06/2014)  . Chronic lower back pain    "spurs and pinched nerves" (09/18/2017)  . COPD (chronic obstructive pulmonary disease) ([place])   . DDD (degenerative disc disease), lumbar   . Depression    psychiatrist Dr. Toy Care  . Diastolic dysfunction   . Emphysematous COPD ([place])    changes in right base along with patchy areas on last x-ray  . Falls frequently    "several in 2017; fell at dr's office yesterday" (09/18/2017)  . GERD (gastroesophageal reflux disease)   . HCAP (healthcare-associated pneumonia) 02/12/2016  . Heart murmur   . History of cardiovascular stress test 08/15/2004   EF of 70% -- Normal stress cardiolite.  There is no evidence of ischemia and there is normal LV function. -- [name]. MD  . History of echocardiogram 12/24/2006   Est. EF of 83-15% NORMAL LV SYSTOLIC FUNCTION WITH IMPAIRED RELAXATION -- MILD AORTIC SCLEROSIS -- NORMAL PALONARY ARTERY PRESSURE -- NO OLD ECHOS FOR COMPARISON  -- [name], MD  . Hypertension    essential hypertension  . Migraine    "when I was having my periods; none since then" (09/18/2017)  . Necrotic pneumonia ([place])    recurrent/notes 02/12/2016  . Pneumonia    "several times since May 2015" (07/06/2014)  . Pollen allergies   . Tobacco abuse    smoking about 1/2 pk of cigarettes a day    Patient Active Problem List   Diagnosis Date Noted  . CAP (community acquired pneumonia) 09/17/2017  . Near syncope 09/17/2017  . Tobacco abuse 02/12/2016  . Other fatigue 02/04/2016  . Respiratory failure with hypoxia ([place]) 01/09/2016  . Malnutrition of moderate degree 11/03/2015  . Pulmonary emphysema ([place])   . Essential hypertension 09/27/2015  . Rhabdomyolysis 08/16/2015  . Chronic cough 06/07/2015  . Bronchiectasis with acute exacerbation ([place]) 03/07/2015  . Cigarette smoker 12/13/2014  . COPD GOLD II/ still smoking 10/11/2014  . Lumbar back pain with radiculopathy affecting right lower extremity 09/28/2014  . Hyponatremia 07/09/2014  . Hx of recurrent pneumonia 07/08/2014  . Venous insufficiency (chronic) (peripheral) 09/29/2012  . Lower extremity edema 03/09/2012  . Benign hypertensive heart disease without heart failure 05/09/2011  . Chest pain 05/09/2011  . Dyslipidemia 05/09/2011  . Mood disorder ([place]) 05/09/2011  . Osteoarthritis 05/09/2011    Past Surgical History:  Procedure Laterality  Date  . DILATION AND CURETTAGE OF UTERUS    . TUBAL LIGATION  1986    OB History   None      Home Medications    Prior to Admission medications   Medication Sig Start Date End Date Taking? Authorizing Provider  albuterol (PROVENTIL HFA;VENTOLIN HFA) 108 (90 Base) MCG/ACT inhaler Inhale 2 puffs into the lungs every 6 (six) hours as needed for wheezing or shortness of breath. 02/19/18   Gale Journey, Damaris Hippo, PA-C  albuterol (PROVENTIL) (2.5 MG/3ML) 0.083% nebulizer solution Albuterol nebulizer 02/16/18   Weber, Damaris Hippo, PA-C  ALPRAZolam  Duanne Moron) 0.5 MG tablet TAKE 1 TABLET BY MOUTH EVERYDAY AT BEDTIME 02/19/18   Weber, Sarah L, PA-C  amoxicillin-clavulanate (AUGMENTIN) 875-125 MG tablet Take 1 tablet by mouth every 12 (twelve) hours. 02/28/18   Ok Edwards, PA-C  aspirin EC 81 MG tablet Take 81-162 mg by mouth See admin instructions. 81 mg once a day "for heart health" and 162 mg one to two times a day as needed for headaches    [provider]  benzonatate (TESSALON) 100 MG capsule Take 1 capsule (100 mg total) by mouth 3 (three) times daily as needed for cough. 01/02/18   Rutherford Guys, MD  budesonide-formoterol [facility]) 160-4.5 MCG/ACT inhaler Inhale 2 puffs into the lungs 2 (two) times daily. 09/19/17   Doreatha Lew, MD  calcium carbonate (TUMS EX) 750 MG chewable tablet Chew 1 tablet by mouth 2 (two) times daily as needed for heartburn.    [provider]  cephALEXin (KEFLEX) 500 MG capsule Take 1 capsule (500 mg total) by mouth 4 (four) times daily. 02/19/18   Gale Journey, Damaris Hippo, PA-C  divalproex (DEPAKOTE ER) 500 MG 24 hr tablet Take 500 mg by mouth at bedtime 12/08/17   Harrison Mons, PA-C  doxycycline (VIBRAMYCIN) 100 MG capsule Take 1 capsule (100 mg total) by mouth 2 (two) times daily. 02/28/18   Tasia Catchings, Wendall Isabell V, PA-C  fluticasone (FLONASE) 50 MCG/ACT nasal spray Place 2 sprays into both nostrils daily. 02/19/18   Weber, Damaris Hippo, PA-C  gabapentin (NEURONTIN) 100 MG capsule Take 1 capsule (100 mg total) by mouth at bedtime. 02/19/18   Weber, Damaris Hippo, PA-C  HYDROcodone-acetaminophen (NORCO/VICODIN) 5-325 MG tablet Take 0.5-1 tablets by mouth every 8 (eight) hours as needed for moderate pain. 01/02/18   Rutherford Guys, MD  nortriptyline (PAMELOR) 50 MG capsule TAKE 2 CAPSULES BY MOUTH NIGHTLY 10/27/17   Harrison Mons, PA-C  spironolactone (ALDACTONE) 50 MG tablet Take 1 tablet (50 mg total) by mouth daily. Office visit needed for refills 01/02/18   Rutherford Guys, MD  tiZANidine (ZANAFLEX) 2 MG tablet TAKE 1 TABLET  (2 MG TOTAL) BY MOUTH AT BEDTIME. 02/12/18   Weber, Damaris Hippo, PA-C  triamcinolone cream (KENALOG) 0.1 % Apply 1 application topically 2 (two) times daily. 01/16/18   Gale Journey, Damaris Hippo, PA-C    Family History Family History  Problem Relation Age of Onset  . Stroke Mother   . Heart disease Father   . Hyperlipidemia Father   . Hypertension Father   . Breast cancer Maternal Aunt   . Asthma Maternal Grandmother     Social History Social History   Tobacco Use  . Smoking status: Current Every Day Smoker    Packs/day: 0.50    Years: 46.00    Pack years: 23.00    Types: Cigarettes  . Smokeless tobacco: Never Used  Substance Use Topics  . Alcohol use:  Yes    Alcohol/week: 0.0 oz    Comment: 09/18/2017 "might have 1 drink/month; if that"  . Drug use: No     Allergies   Sulfa drugs cross reactors; Ceclor [cefaclor]; Depakote er [divalproex sodium er]; Escitalopram oxalate; and Sertraline hcl   Review of Systems Review of Systems  Reason unable to perform ROS: See HPI as above.     Physical Exam Triage Vital Signs ED Triage Vitals  Enc Vitals Group     BP 02/28/18 1421 (!) 148/90     Pulse Rate 02/28/18 1421 91     Resp 02/28/18 1421 18     Temp 02/28/18 1421 98.1 F (36.7 C)     Temp src --      SpO2 02/28/18 1421 99 %     Weight --      Height --      Head Circumference --      Peak Flow --      Pain Score 02/28/18 1419 10     Pain Loc --      Pain Edu? --      Excl. in [place]? --    No data found.  Updated Vital Signs BP (!) 148/90   Pulse 91   Temp 98.1 F (36.7 C)   Resp 18   SpO2 99%   Physical Exam  Constitutional: She is oriented to person, place, and time. She appears well-developed and well-nourished. No distress.  HENT:  Head: Normocephalic and atraumatic.  Eyes: Pupils are equal, round, and reactive to light. Conjunctivae are normal.  Musculoskeletal:  See picture below. Erythema to the left lower extremity without obvious increase warmth. No  pitting edema. No tenderness to palpation of the calf.   Neurological: She is alert and oriented to person, place, and time.        UC Treatments / Results  Labs (all labs ordered are listed, but only abnormal results are displayed) Labs Reviewed - No data to display  EKG None Radiology No results found.  Procedures Procedures (including critical care time)  Medications Ordered in UC Medications - No data to display   Initial Impression / Assessment and Plan / UC Course  I have reviewed the triage vital signs and the nursing notes.  Pertinent labs & imaging results that were available during my care of the patient were reviewed by me and considered in my medical decision making (see chart for details).    Discussed with patient given significant worsening of erythema despite on antibiotics, would like patient to be evaluated at the emergency department. Patient declined going to the ED. Will start patient on augmentin and doxycycline with strict return precautions. Otherwise, follow up here or with PCP in 3-4 days for recheck. Patient expresses understanding and agrees to plan.   Final Clinical Impressions(s) / UC Diagnoses   Final diagnoses:  Cellulitis of left lower extremity  Left leg pain    ED Discharge Orders        Ordered    amoxicillin-clavulanate (AUGMENTIN) 875-125 MG tablet  Every 12 hours     02/28/18 1455    doxycycline (VIBRAMYCIN) 100 MG capsule  2 times daily     02/28/18 1455        Ok Edwards, [place] 02/28/18 1725

## 2018-02-28 NOTE — Discharge Instructions (Addendum)
As discussed, given you are not willing to go to the emergency department, I will start you on other antibiotics. However, I would really recommend further evaluation at the emergency department given worsening of symptoms. Start augmentin and doxycycline. Please monitor closely, if you notice any worsening of symptoms, fever/chills, weakness/dizziness, spreading redness, increased warmth, go to the emergency department immediately. Otherwise, follow up here or with PCP in 3 days for reevaluation.

## 2018-03-02 ENCOUNTER — Encounter: Payer: Self-pay | Admitting: Physician Assistant

## 2018-03-05 ENCOUNTER — Other Ambulatory Visit: Payer: Self-pay

## 2018-03-05 DIAGNOSIS — J449 Chronic obstructive pulmonary disease, unspecified: Secondary | ICD-10-CM

## 2018-03-05 MED ORDER — ALBUTEROL SULFATE HFA 108 (90 BASE) MCG/ACT IN AERS: 2.0000 | INHALATION_SPRAY | Freq: Four times a day (QID) | RESPIRATORY_TRACT | 1 refills | Status: DC | PRN

## 2018-03-11 ENCOUNTER — Other Ambulatory Visit: Payer: Self-pay | Admitting: Physician Assistant

## 2018-03-11 DIAGNOSIS — M62838 Other muscle spasm: Secondary | ICD-10-CM

## 2018-03-11 NOTE — Telephone Encounter (Signed)
Refill request Zanaflex   LOV 02/19/2018  Pharmacy on File

## 2018-03-12 NOTE — Telephone Encounter (Signed)
Done

## 2018-03-12 NOTE — Telephone Encounter (Signed)
Patient is requesting a refill of the following medications: Requested Prescriptions   Pending Prescriptions Disp Refills  . tiZANidine (ZANAFLEX) 2 MG tablet [Pharmacy Med Name: TIZANIDINE HCL 2 MG TABLET] 30 tablet 0    Sig: TAKE 1 TABLET (2 MG TOTAL) BY MOUTH AT BEDTIME.    Date of patient request: 03/11/2018 Last office visit: 02/19/2018 Date of last refill: 02/12/2018 Last refill amount: 30 Follow up time period per chart:

## 2018-03-14 ENCOUNTER — Other Ambulatory Visit: Payer: Self-pay | Admitting: Physician Assistant

## 2018-03-14 DIAGNOSIS — F418 Other specified anxiety disorders: Secondary | ICD-10-CM

## 2018-03-16 ENCOUNTER — Other Ambulatory Visit: Payer: Self-pay | Admitting: Physician Assistant

## 2018-03-16 DIAGNOSIS — M545 Low back pain, unspecified: Secondary | ICD-10-CM

## 2018-03-16 DIAGNOSIS — G8929 Other chronic pain: Secondary | ICD-10-CM

## 2018-03-16 NOTE — Telephone Encounter (Signed)
Patient is requesting a refill of the following medications: Requested Prescriptions   Pending Prescriptions Disp Refills  . gabapentin (NEURONTIN) 100 MG capsule [Pharmacy Med Name: GABAPENTIN 100 MG CAPSULE] 30 capsule 0    Sig: TAKE 1 CAPSULE (100 MG TOTAL) BY MOUTH AT BEDTIME.    Date of patient request: 03/16/2018 Last office visit: 02/19/2018 Date of last refill: 02/19/2018 Last refill amount: 30 Follow up time period per chart: follow up one month

## 2018-03-17 ENCOUNTER — Other Ambulatory Visit: Payer: Self-pay | Admitting: Physician Assistant

## 2018-03-17 DIAGNOSIS — F5105 Insomnia due to other mental disorder: Principal | ICD-10-CM

## 2018-03-17 DIAGNOSIS — F418 Other specified anxiety disorders: Secondary | ICD-10-CM

## 2018-03-17 NOTE — Telephone Encounter (Signed)
Patient is requesting a refill of the following medications: Requested Prescriptions   Pending Prescriptions Disp Refills  . ALPRAZolam (XANAX) 0.5 MG tablet [Pharmacy Med Name: ALPRAZOLAM 0.5 MG TABLET] 30 tablet 0    Sig: TAKE 1 TABLET BY MOUTH EVERYDAY AT BEDTIME    Date of patient request: 03/17/2018 Last office visit: 02/19/2018 Date of last refill: 02/19/2018 Last refill amount: 30 Follow up time period per chart: follow up one month

## 2018-03-23 NOTE — Telephone Encounter (Signed)
Please advise 

## 2018-03-23 NOTE — Telephone Encounter (Signed)
Patient would like to know can this medication be called in to get her to her appt on 03/27/18. Please advise.

## 2018-03-24 NOTE — Telephone Encounter (Signed)
Pt called to get the status for the xanax, she has been waiting since the 17th of March per pt.

## 2018-03-26 ENCOUNTER — Telehealth: Payer: Self-pay

## 2018-03-26 NOTE — Telephone Encounter (Signed)
Pt has OV with Judson Roch tomorrow. Was supposed to be seen by 4/19 for med refill. Med will be filled on evaluation at appointment.   Copied from [place] [phone]. Topic: Quick Communication - See Telephone Encounter >> Mar 25, 2018  3:25 PM Antonieta Iba C wrote: CRM for notification. See Telephone encounter for: 03/25/18.  Pt called in to check the status of her refill request for alprazolam, please advise.

## 2018-03-27 ENCOUNTER — Other Ambulatory Visit: Payer: Self-pay

## 2018-03-27 ENCOUNTER — Ambulatory Visit (INDEPENDENT_AMBULATORY_CARE_PROVIDER_SITE_OTHER): Payer: Medicare Other | Admitting: Physician Assistant

## 2018-03-27 ENCOUNTER — Encounter: Payer: Self-pay | Admitting: Physician Assistant

## 2018-03-27 VITALS — BP 124/78 | HR 89 | Temp 98.2°F | Resp 18 | Ht 63.0 in | Wt 122.6 lb

## 2018-03-27 DIAGNOSIS — S8012XD Contusion of left lower leg, subsequent encounter: Secondary | ICD-10-CM | POA: Diagnosis not present

## 2018-03-27 DIAGNOSIS — F39 Unspecified mood [affective] disorder: Secondary | ICD-10-CM | POA: Diagnosis not present

## 2018-03-27 DIAGNOSIS — G8929 Other chronic pain: Secondary | ICD-10-CM | POA: Diagnosis not present

## 2018-03-27 DIAGNOSIS — F418 Other specified anxiety disorders: Secondary | ICD-10-CM | POA: Diagnosis not present

## 2018-03-27 DIAGNOSIS — F5105 Insomnia due to other mental disorder: Secondary | ICD-10-CM | POA: Diagnosis not present

## 2018-03-27 DIAGNOSIS — M545 Low back pain: Secondary | ICD-10-CM | POA: Diagnosis not present

## 2018-03-27 MED ORDER — TRAMADOL HCL 50 MG PO TABS: 50.0000 mg | ORAL_TABLET | Freq: Every day | ORAL | 0 refills | Status: DC | PRN

## 2018-03-27 MED ORDER — ALPRAZOLAM 0.5 MG PO TABS: ORAL_TABLET | ORAL | 0 refills | Status: DC

## 2018-03-27 NOTE — Patient Instructions (Addendum)
[facility] for Dr. Darleene Cleaver - [address] [place], [place] [address] [phone] -- please call for an appt   Do not take the pain medications with your Xanax or the muscle relaxer at night.  Heat on the lump on your leg  IF you received an x-ray today, you will receive an invoice from [facility]. Please contact [facility] at [phone] with questions or concerns regarding your invoice.   IF you received labwork today, you will receive an invoice from [facility]. Please contact [facility] at [phone] with questions or concerns regarding your invoice.   Our billing staff will not be able to assist you with questions regarding bills from these companies.  You will be contacted with the lab results as soon as they are available. The fastest way to get your results is to activate your My Chart account. Instructions are located on the last page of this paperwork. If you have not heard from [name] regarding the results in 2 weeks, please contact this office.

## 2018-03-27 NOTE — Progress Notes (Signed)
Catherine Kelley  MRN: [id number] DOB: May 01, 1951  PCP: Mancel Bale, PA-C  Chief Complaint  Patient presents with  . Med Check    Alprazolam 0.5 MG    Subjective:  Pt presents to clinic for medication refill.  She has been rationing out her Xanax the last several nights and has only been taking 1/2 pill and she is nervous and not sleeping well.  We discussed that her drug screen showed temazepam and not alprazolom - she has not idea how this happened - she takes her medication out of the bottle nightly - she does not trade her medications with anyone and she does not think her husband is trading out her pills.  She is not sure why there is no Xanax in her urine.  She has been taking it the last month.  She is very concerned that she is not going to get any today and she states "must have to sleep."  She is dependant on her husband - he will not allow her to drive.  She got a call from psychiatry but she did not call them back to make an appt  She still has a swelling on her left shin from her fall in March - she wants to make sure it is ok.  It is tender but it has gotten much smaller but she is worried that it is not gone yet.  She has been using gabapentin and she did feel like it made her more calm but she does not think it helps her sleep.  She still would like something for pain for her back and she can use as needed - she knows that she cannot use at night with her other medications.  History is obtained by patient.  Review of Systems  Psychiatric/Behavioral: Positive for sleep disturbance. The patient is nervous/anxious.     Patient Active Problem List   Diagnosis Date Noted  . CAP (community acquired pneumonia) 09/17/2017  . Near syncope 09/17/2017  . Tobacco abuse 02/12/2016  . Other fatigue 02/04/2016  . Respiratory failure with hypoxia ([place]) 01/09/2016  . Malnutrition of moderate degree 11/03/2015  . Pulmonary emphysema ([place])   . Essential hypertension 09/27/2015  .  Rhabdomyolysis 08/16/2015  . Chronic cough 06/07/2015  . Bronchiectasis with acute exacerbation ([place]) 03/07/2015  . Cigarette smoker 12/13/2014  . COPD GOLD II/ still smoking 10/11/2014  . Lumbar back pain with radiculopathy affecting right lower extremity 09/28/2014  . Hyponatremia 07/09/2014  . Hx of recurrent pneumonia 07/08/2014  . Venous insufficiency (chronic) (peripheral) 09/29/2012  . Lower extremity edema 03/09/2012  . Benign hypertensive heart disease without heart failure 05/09/2011  . Chest pain 05/09/2011  . Dyslipidemia 05/09/2011  . Mood disorder ([place]) 05/09/2011  . Osteoarthritis 05/09/2011    Current Outpatient Medications on File Prior to Visit  Medication Sig Dispense Refill  . albuterol (PROVENTIL HFA;VENTOLIN HFA) 108 (90 Base) MCG/ACT inhaler Inhale 2 puffs into the lungs every 6 (six) hours as needed for wheezing or shortness of breath. 1 Inhaler 1  . albuterol (PROVENTIL) (2.5 MG/3ML) 0.083% nebulizer solution Albuterol nebulizer 150 mL 0  . aspirin EC 81 MG tablet Take 81-162 mg by mouth See admin instructions. 81 mg once a day "for heart health" and 162 mg one to two times a day as needed for headaches    . budesonide-formoterol (SYMBICORT) 160-4.5 MCG/ACT inhaler Inhale 2 puffs into the lungs 2 (two) times daily. 1 Inhaler 12  . calcium carbonate (TUMS EX)  750 MG chewable tablet Chew 1 tablet by mouth 2 (two) times daily as needed for heartburn.    . divalproex (DEPAKOTE ER) 500 MG 24 hr tablet TAKE 1 TAB BY MOUTH AT BEDTIME 90 tablet 0  . fluticasone (FLONASE) 50 MCG/ACT nasal spray Place 2 sprays into both nostrils daily. 16 g 6  . gabapentin (NEURONTIN) 100 MG capsule TAKE 1 CAPSULE (100 MG TOTAL) BY MOUTH AT BEDTIME. 30 capsule 0  . nortriptyline (PAMELOR) 50 MG capsule TAKE 2 CAPSULES BY MOUTH NIGHTLY 60 capsule 5  . spironolactone (ALDACTONE) 50 MG tablet Take 1 tablet (50 mg total) by mouth daily. Office visit needed for refills 90 tablet 0  . tiZANidine  (ZANAFLEX) 2 MG tablet TAKE 1 TABLET (2 MG TOTAL) BY MOUTH AT BEDTIME. 30 tablet 0  . triamcinolone cream (KENALOG) 0.1 % Apply 1 application topically 2 (two) times daily. 30 g 0   No current facility-administered medications on file prior to visit.     Allergies  Allergen Reactions  . Sulfa Drugs Cross Reactors Hives and Rash    Hives and rash  . Ceclor [Cefaclor] Hives    Tolerated ceftazidime December 2016  . Depakote Er [Divalproex Sodium Er] Other (See Comments)    Patient remarked that when she was taking this TWICE a day, she became "clumsy" and felt more prone to stumbling  . Escitalopram Oxalate Other (See Comments)    Pt does not recall ever taking medication (??)  . Sertraline Hcl     Severe headache    Past Medical History:  Diagnosis Date  . Anxiety   . Arthritis    "jooints" (09/18/2017)  . Asthma   . Bipolar disorder ([place])   . CAP (community acquired pneumonia) 09/18/2017  . Cat allergies   . Chronic bronchitis ([place])    "get it alot; maybe not q yr" (07/06/2014)  . Chronic lower back pain    "spurs and pinched nerves" (09/18/2017)  . COPD (chronic obstructive pulmonary disease) ([place])   . DDD (degenerative disc disease), lumbar   . Depression    psychiatrist Dr. Toy Care  . Diastolic dysfunction   . Emphysematous COPD ([place])    changes in right base along with patchy areas on last x-ray  . Falls frequently    "several in 2017; fell at dr's office yesterday" (09/18/2017)  . GERD (gastroesophageal reflux disease)   . HCAP (healthcare-associated pneumonia) 02/12/2016  . Heart murmur   . History of cardiovascular stress test 08/15/2004   EF of 70% -- Normal stress cardiolite.  There is no evidence of ischemia and there is normal LV function. -- [name]. MD  . History of echocardiogram 12/24/2006   Est. EF of 40-34% NORMAL LV SYSTOLIC FUNCTION WITH IMPAIRED RELAXATION -- MILD AORTIC SCLEROSIS -- NORMAL PALONARY ARTERY PRESSURE -- NO OLD ECHOS FOR COMPARISON  -- [name], MD  . Hypertension    essential hypertension  . Migraine    "when I was having my periods; none since then" (09/18/2017)  . Necrotic pneumonia ([place])    recurrent/notes 02/12/2016  . Pneumonia    "several times since May 2015" (07/06/2014)  . Pollen allergies   . Tobacco abuse    smoking about 1/2 pk of cigarettes a day   Social History   Social History Narrative   Patient currently lives with her husband.    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 No bird or hot tub exposure. No mold exposure. No recent travel.   Social History   Tobacco Use  . Smoking status: Current Every Day Smoker    Packs/day: 0.50    Years: 46.00    Pack years: 23.00    Types: Cigarettes  . Smokeless tobacco: Never Used  Substance Use Topics  . Alcohol use: Yes    Alcohol/week: 0.0 oz    Comment: 09/18/2017 "might have 1 drink/month; if that"  . Drug use: No   family history includes Asthma in her maternal grandmother; Breast cancer in her maternal aunt; Heart disease in her father; Hyperlipidemia in her father; Hypertension in her father; Stroke in her mother.     Objective:  BP 124/78 (BP Location: Left Arm, Patient Position: Sitting, Cuff Size: Normal)   Pulse 89   Temp 98.2 F (36.8 C) (Oral)   Resp 18   Ht 5\' 3"  (1.6 m)   Wt 122 lb 9.6 oz (55.6 kg)   SpO2 96%   BMI 21.72 kg/m  Body mass index is 21.72 kg/m.  Physical Exam  Constitutional: She is oriented to person, place, and time. She appears well-developed.  HENT:  Head: Normocephalic and atraumatic.  Right Ear: Hearing and external ear normal.  Left Ear: Hearing and external ear normal.  Eyes: Conjunctivae are normal.  Neck: Normal range of motion.  Pulmonary/Chest: Effort normal.  Neurological: She is alert and oriented to person, place, and time.  Skin: Skin is warm and dry.  2x3 cm soft nodule -- where her  hematoma was from her 3/22 visit - no erythema - some mild TTP  Psychiatric: Judgment normal.  Pt is calmer today - more calm with her speech - very anxious when we talked about Xanax and drug screen results  Vitals reviewed.   Assessment and Plan :  Insomnia secondary to depression with anxiety - Plan: ALPRAZolam (XANAX) 0.5 MG tablet, ToxASSURE Select 13 (MW), Urine - Pt is adamant that she has been taking her xanax for the last month - we will repeat the urine drug screen today - I am concerned someone at home is trading her medications - but we will find out from the urine drug test preformed today  Mood disorder ([place]) - Plan: ToxASSURE Select 13 (MW), Urine - she will call and make an appt as I think her bipolar is not controlled and she needs medication adjustment as she cannot tolerate Depakote at higher levels due to dizziness and her depakote is not in range  Chronic bilateral low back pain without sciatica - Plan: traMADol (ULTRAM) 50 MG tablet - gae her #10 ultram to help with her pain - she was instructed to not take at night - 10 should last her a month at least  Anxiety associated with depression - Plan: ToxASSURE Select 13 (MW), Urine  Hematoma - warm compresses - it is much better but we will continue to monitor - we will recheck in a month  Discussed with Dr Lonia Skinner PA-C  [facility] 03/27/2018 10:54 AM

## 2018-04-02 DIAGNOSIS — H1859 Other hereditary corneal dystrophies: Secondary | ICD-10-CM | POA: Diagnosis not present

## 2018-04-02 DIAGNOSIS — H25813 Combined forms of age-related cataract, bilateral: Secondary | ICD-10-CM | POA: Diagnosis not present

## 2018-04-02 DIAGNOSIS — H04123 Dry eye syndrome of bilateral lacrimal glands: Secondary | ICD-10-CM | POA: Diagnosis not present

## 2018-04-02 LAB — TOXASSURE SELECT 13 (MW), URINE

## 2018-04-05 ENCOUNTER — Ambulatory Visit: Payer: Self-pay | Admitting: Internal Medicine

## 2018-04-10 ENCOUNTER — Other Ambulatory Visit: Payer: Self-pay | Admitting: Physician Assistant

## 2018-04-10 DIAGNOSIS — M62838 Other muscle spasm: Secondary | ICD-10-CM

## 2018-04-12 ENCOUNTER — Telehealth: Payer: Self-pay | Admitting: Physician Assistant

## 2018-04-12 DIAGNOSIS — J449 Chronic obstructive pulmonary disease, unspecified: Secondary | ICD-10-CM

## 2018-04-12 MED ORDER — ALBUTEROL SULFATE HFA 108 (90 BASE) MCG/ACT IN AERS: 2.0000 | INHALATION_SPRAY | Freq: Four times a day (QID) | RESPIRATORY_TRACT | 1 refills | Status: DC | PRN

## 2018-04-12 MED ORDER — ALBUTEROL SULFATE (2.5 MG/3ML) 0.083% IN NEBU: INHALATION_SOLUTION | RESPIRATORY_TRACT | 0 refills | Status: DC

## 2018-04-12 NOTE — Telephone Encounter (Signed)
Patient checking status, also is requesting refill on lbuterol (PROVENTIL HFA;VENTOLIN HFA) 108 (90 Base) MCG/ACT inhaler and albuterol (PROVENTIL) (2.5 MG/3ML) 0.083% nebulizer solution

## 2018-04-12 NOTE — Telephone Encounter (Signed)
Zanaflex refill Last OV:03/27/18 Last refill:03/11/18 30 tab/0 refill O[id number] Pharmacy: [id number] [name], [place] [phone] (Phone) [phone] (Fax)

## 2018-04-12 NOTE — Telephone Encounter (Signed)
Refilled

## 2018-04-15 ENCOUNTER — Other Ambulatory Visit: Payer: Self-pay | Admitting: Physician Assistant

## 2018-04-15 DIAGNOSIS — M545 Low back pain, unspecified: Secondary | ICD-10-CM

## 2018-04-15 DIAGNOSIS — G8929 Other chronic pain: Secondary | ICD-10-CM

## 2018-04-16 NOTE — Telephone Encounter (Signed)
Patient is requesting a refill of the following medications: Requested Prescriptions   Pending Prescriptions Disp Refills  . gabapentin (NEURONTIN) 100 MG capsule [Pharmacy Med Name: GABAPENTIN 100 MG CAPSULE] 30 capsule 0    Sig: TAKE 1 CAPSULE (100 MG TOTAL) BY MOUTH AT BEDTIME.    Date of patient request: 04/15/2018 Last office visit: 03/27/2018 Date of last refill:03/18/2018 Last refill amount: 30 Follow up time period per chart:

## 2018-04-19 ENCOUNTER — Other Ambulatory Visit: Payer: Self-pay | Admitting: Physician Assistant

## 2018-04-19 DIAGNOSIS — F5105 Insomnia due to other mental disorder: Principal | ICD-10-CM

## 2018-04-19 DIAGNOSIS — M545 Low back pain: Secondary | ICD-10-CM

## 2018-04-19 DIAGNOSIS — F418 Other specified anxiety disorders: Secondary | ICD-10-CM

## 2018-04-19 DIAGNOSIS — G8929 Other chronic pain: Secondary | ICD-10-CM

## 2018-04-20 MED ORDER — GABAPENTIN 100 MG PO CAPS: ORAL_CAPSULE | ORAL | 0 refills | Status: DC

## 2018-04-20 NOTE — Telephone Encounter (Signed)
Patient is requesting a refill of the following medications: Requested Prescriptions   Pending Prescriptions Disp Refills  . ALPRAZolam (XANAX) 0.5 MG tablet [Pharmacy Med Name: ALPRAZOLAM 0.5 MG TABLET] 30 tablet 0    Sig: TAKE 1 TABLET BY MOUTH EVERYDAY AT BEDTIME    Date of patient request: 04/19/2018 Last office visit: 03/27/2018 Date of last refill: 03/27/2018 Last refill amount: 30 Follow up time period per chart:

## 2018-04-20 NOTE — Telephone Encounter (Signed)
Can we please call and make an appt with psychiatry for the patient - she states she has called multiple times this week and last and not gotten a call back about her appt.

## 2018-04-20 NOTE — Telephone Encounter (Signed)
Rx  Refill request xanax 0.5 scheduled   [facility]ov 03/27/2018  [facility]  Last filled  03/27/2018 -  30 tabs no refill  [place] on file

## 2018-04-20 NOTE — Telephone Encounter (Signed)
I called and spoke with patient she has been unable to get a call about an appt with psychiatry.    This am she took gabapentin in the morning and she tolerated it well.  She will take 2 at night and 1 in the am.  She feels like the gabapentin is helping -her depression is better since the start of this.    She is having total teeth extraction this week.  She has recently noticed that her Xanax has not been helping her sleep, but her gabapentin has helped her sleep.   We discussed that she has had 2 drug screens without xanax in either of them - I can not longer Rx the medication - she was ok with that.  She feels like the pharmacist must not be giving her the medication - I told her that was unlikely.  She was manic today on the phone.

## 2018-04-22 NOTE — Telephone Encounter (Signed)
I talked to the patient at length on 5/22 regarding that I cannot write for more xanax as this has not been in her urine for the last 2 months.  I know she has been told she should not stop this abruptly but if it is not in her urine for the last 2 months then she will be fine.  (FYI-Yesterday when I spoke with the patient she was quite manic so it is possible that she does not remember this conversation.)

## 2018-04-22 NOTE — Telephone Encounter (Signed)
I am happy for her to wait for [facility].  I think I have got her stable for right now - so July would be fine.

## 2018-04-22 NOTE — Telephone Encounter (Signed)
Pt is scheduled for 06/10/18 at 10:00am at [facility]. I called pt to tell her about the appointment, and she wanted to call them to see if she could get appt moved to a Friday. I gave her their address and phone number to get this changed. Pt also wanted me to check into her prescription for Xanax. She said she was told by a nurse here that she would have to see psychiatry to get this, but wanted me to send a message to check into that.   Thanks!  Deneise Lever

## 2018-04-27 ENCOUNTER — Encounter: Payer: Self-pay | Admitting: Family Medicine

## 2018-04-29 ENCOUNTER — Telehealth: Payer: Self-pay | Admitting: Physician Assistant

## 2018-04-29 ENCOUNTER — Other Ambulatory Visit: Payer: Self-pay | Admitting: *Deleted

## 2018-04-29 DIAGNOSIS — J449 Chronic obstructive pulmonary disease, unspecified: Secondary | ICD-10-CM

## 2018-04-29 MED ORDER — ALBUTEROL SULFATE (2.5 MG/3ML) 0.083% IN NEBU: INHALATION_SOLUTION | RESPIRATORY_TRACT | 0 refills | Status: DC

## 2018-04-29 NOTE — Telephone Encounter (Signed)
Copied from [place] [id number]. Topic: General - Other >> Apr 29, 2018  3:15 PM Margot Ables wrote: Reason for CRM: they received RX for albuterol solution w/o a frequency of use. Please advise.  [id number] [name], [place] [phone] (Phone) [phone] (Fax)

## 2018-05-03 ENCOUNTER — Other Ambulatory Visit: Payer: Self-pay | Admitting: Physician Assistant

## 2018-05-03 DIAGNOSIS — M62838 Other muscle spasm: Secondary | ICD-10-CM

## 2018-05-03 NOTE — Telephone Encounter (Signed)
Called pharmacy. No issues.

## 2018-05-04 NOTE — Telephone Encounter (Signed)
Patient is requesting a refill of the following medications: Requested Prescriptions   Pending Prescriptions Disp Refills  . tiZANidine (ZANAFLEX) 2 MG tablet [Pharmacy Med Name: TIZANIDINE HCL 2 MG TABLET] 30 tablet 0    Sig: TAKE 1 TABLET (2 MG TOTAL) BY MOUTH AT BEDTIME.    Date of patient request: 05/03/2018 Last office visit: 03/27/2018 Date of last refill: 04/12/2018 Last refill amount: 30 Follow up time period per chart:

## 2018-05-16 ENCOUNTER — Other Ambulatory Visit: Payer: Self-pay | Admitting: Physician Assistant

## 2018-05-16 DIAGNOSIS — F418 Other specified anxiety disorders: Secondary | ICD-10-CM

## 2018-05-19 ENCOUNTER — Other Ambulatory Visit: Payer: Self-pay | Admitting: Physician Assistant

## 2018-05-19 DIAGNOSIS — F418 Other specified anxiety disorders: Secondary | ICD-10-CM

## 2018-05-20 NOTE — Telephone Encounter (Signed)
Patient is requesting a refill of the following medications: Requested Prescriptions   Pending Prescriptions Disp Refills  . divalproex (DEPAKOTE ER) 500 MG 24 hr tablet [Pharmacy Med Name: DIVALPROEX SOD ER 500 MG TAB] 90 tablet 0    Sig: TAKE 1 TABLET BY MOUTH EVERYDAY AT BEDTIME    Date of patient request: 05/19/2018 Last office visit: 03/27/2018 Date of last refill: 03/15/2018 Last refill amount: 90 Follow up time period per chart:

## 2018-05-21 ENCOUNTER — Encounter: Payer: Self-pay | Admitting: Physician Assistant

## 2018-05-21 ENCOUNTER — Ambulatory Visit: Payer: Medicare Other | Admitting: Cardiovascular Disease

## 2018-05-21 ENCOUNTER — Ambulatory Visit (INDEPENDENT_AMBULATORY_CARE_PROVIDER_SITE_OTHER): Payer: Medicare Other | Admitting: Physician Assistant

## 2018-05-21 ENCOUNTER — Other Ambulatory Visit: Payer: Self-pay

## 2018-05-21 VITALS — BP 120/70 | HR 91 | Temp 98.0°F | Resp 16 | Ht 62.99 in | Wt 117.0 lb

## 2018-05-21 DIAGNOSIS — F418 Other specified anxiety disorders: Secondary | ICD-10-CM

## 2018-05-21 DIAGNOSIS — M545 Low back pain: Secondary | ICD-10-CM

## 2018-05-21 DIAGNOSIS — J449 Chronic obstructive pulmonary disease, unspecified: Secondary | ICD-10-CM

## 2018-05-21 DIAGNOSIS — M62838 Other muscle spasm: Secondary | ICD-10-CM

## 2018-05-21 DIAGNOSIS — F5105 Insomnia due to other mental disorder: Secondary | ICD-10-CM | POA: Diagnosis not present

## 2018-05-21 DIAGNOSIS — G8929 Other chronic pain: Secondary | ICD-10-CM

## 2018-05-21 DIAGNOSIS — F39 Unspecified mood [affective] disorder: Secondary | ICD-10-CM

## 2018-05-21 MED ORDER — TIZANIDINE HCL 2 MG PO TABS: 2.0000 mg | ORAL_TABLET | Freq: Every day | ORAL | 0 refills | Status: DC

## 2018-05-21 NOTE — Patient Instructions (Addendum)
For your mood Gabapentin - 100 in the am and 200 at night Depakote - 500mg  at night Nortriptyline - at night  mucinex - for chest congestion  IF you received an x-ray today, you will receive an invoice from [facility]. Please contact [facility] at [phone] with questions or concerns regarding your invoice.   IF you received labwork today, you will receive an invoice from [facility]. Please contact [facility] at [phone] with questions or concerns regarding your invoice.   Our billing staff will not be able to assist you with questions regarding bills from these companies.  You will be contacted with the lab results as soon as they are available. The fastest way to get your results is to activate your My Chart account. Instructions are located on the last page of this paperwork. If you have not heard from [name] regarding the results in 2 weeks, please contact this office.

## 2018-05-21 NOTE — Progress Notes (Signed)
Catherine Kelley  MRN: [id number] DOB: 1950-12-02  PCP: Mancel Bale, PA-C  Chief Complaint  Patient presents with  . Depression    with Anxiety   . Medication Refill    Alprazolam and Depakote     Subjective:  Pt presents to clinic for medication refills.  She feels like her mood has gotten slightly better.  She is still feeling like she cannot sleep good without the xanax and wants a refill.  She has psychiatry appt in 3 weeks. She has been taking her gabapentin bid - she did not increase the evening dose like we talked about at her last phone call.  She does feel like her mood is stabilized some and she is having less less and lows.    Just got dentures after entire tooth extraction and she is still having some gum swelling and pain but she is following instructions regarding the dentures.  She is still eating soft.. She has stopped smoking which she is really proud.   History is obtained by patient.  Review of Systems  Psychiatric/Behavioral: Positive for dysphoric mood (Improved) and sleep disturbance. The patient is nervous/anxious.     Patient Active Problem List   Diagnosis Date Noted  . CAP (community acquired pneumonia) 09/17/2017  . Near syncope 09/17/2017  . Tobacco abuse 02/12/2016  . Other fatigue 02/04/2016  . Respiratory failure with hypoxia ([place]) 01/09/2016  . Malnutrition of moderate degree 11/03/2015  . Pulmonary emphysema ([place])   . Essential hypertension 09/27/2015  . Rhabdomyolysis 08/16/2015  . Chronic cough 06/07/2015  . Bronchiectasis with acute exacerbation ([place]) 03/07/2015  . Cigarette smoker 12/13/2014  . COPD GOLD II/ still smoking 10/11/2014  . Lumbar back pain with radiculopathy affecting right lower extremity 09/28/2014  . Hyponatremia 07/09/2014  . Hx of recurrent pneumonia 07/08/2014  . Venous insufficiency (chronic) (peripheral) 09/29/2012  . Lower extremity edema 03/09/2012  . Benign hypertensive heart disease without heart failure  05/09/2011  . Chest pain 05/09/2011  . Dyslipidemia 05/09/2011  . Mood disorder ([place]) 05/09/2011  . Osteoarthritis 05/09/2011    Current Outpatient Medications on File Prior to Visit  Medication Sig Dispense Refill  . albuterol (PROVENTIL) (2.5 MG/3ML) 0.083% nebulizer solution Albuterol nebulizer  4 TIMES DAILY 150 mL 0  . ALPRAZolam (XANAX) 0.5 MG tablet TAKE 1 TABLET BY MOUTH EVERYDAY AT BEDTIME 30 tablet 0  . aspirin EC 81 MG tablet Take 81-162 mg by mouth See admin instructions. 81 mg once a day "for heart health" and 162 mg one to two times a day as needed for headaches    . budesonide-formoterol (SYMBICORT) 160-4.5 MCG/ACT inhaler Inhale 2 puffs into the lungs 2 (two) times daily. 1 Inhaler 12  . calcium carbonate (TUMS EX) 750 MG chewable tablet Chew 1 tablet by mouth 2 (two) times daily as needed for heartburn.    . divalproex (DEPAKOTE ER) 500 MG 24 hr tablet TAKE 1 TABLET BY MOUTH EVERYDAY AT BEDTIME 90 tablet 0  . fluticasone (FLONASE) 50 MCG/ACT nasal spray Place 2 sprays into both nostrils daily. 16 g 6  . gabapentin (NEURONTIN) 100 MG capsule 1 po qam, 2 po qhs 90 capsule 0  . nortriptyline (PAMELOR) 50 MG capsule TAKE 2 CAPSULES BY MOUTH NIGHTLY 60 capsule 0  . spironolactone (ALDACTONE) 50 MG tablet Take 1 tablet (50 mg total) by mouth daily. Office visit needed for refills 90 tablet 0  . traMADol (ULTRAM) 50 MG tablet Take 1 tablet (50 mg total) by  mouth daily as needed. 10 tablet 0  . triamcinolone cream (KENALOG) 0.1 % Apply 1 application topically 2 (two) times daily. 30 g 0   No current facility-administered medications on file prior to visit.     Allergies  Allergen Reactions  . Sulfa Drugs Cross Reactors Hives and Rash    Hives and rash  . Ceclor [Cefaclor] Hives    Tolerated ceftazidime December 2016  . Depakote Er [Divalproex Sodium Er] Other (See Comments)    Patient remarked that when she was taking this TWICE a day, she became "clumsy" and felt more prone  to stumbling  . Escitalopram Oxalate Other (See Comments)    Pt does not recall ever taking medication (??)  . Sertraline Hcl     Severe headache    Past Medical History:  Diagnosis Date  . Anxiety   . Arthritis    "jooints" (09/18/2017)  . Asthma   . Bipolar disorder ([place])   . CAP (community acquired pneumonia) 09/18/2017  . Cat allergies   . Chronic bronchitis ([place])    "get it alot; maybe not q yr" (07/06/2014)  . Chronic lower back pain    "spurs and pinched nerves" (09/18/2017)  . COPD (chronic obstructive pulmonary disease) ([place])   . DDD (degenerative disc disease), lumbar   . Depression    psychiatrist Dr. Toy Care  . Diastolic dysfunction   . Emphysematous COPD ([place])    changes in right base along with patchy areas on last x-ray  . Falls frequently    "several in 2017; fell at dr's office yesterday" (09/18/2017)  . GERD (gastroesophageal reflux disease)   . HCAP (healthcare-associated pneumonia) 02/12/2016  . Heart murmur   . History of cardiovascular stress test 08/15/2004   EF of 70% -- Normal stress cardiolite.  There is no evidence of ischemia and there is normal LV function. -- [name]. MD  . History of echocardiogram 12/24/2006   Est. EF of 46-50% NORMAL LV SYSTOLIC FUNCTION WITH IMPAIRED RELAXATION -- MILD AORTIC SCLEROSIS -- NORMAL PALONARY ARTERY PRESSURE -- NO OLD ECHOS FOR COMPARISON -- [name], MD  . Hypertension    essential hypertension  . Migraine    "when I was having my periods; none since then" (09/18/2017)  . Necrotic pneumonia ([place])    recurrent/notes 02/12/2016  . Pneumonia    "several times since May 2015" (07/06/2014)  . Pollen allergies   . Tobacco abuse    smoking about 1/2 pk of cigarettes a day   Social History   Social History Narrative   Patient currently lives with her husband.    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 No  bird or hot tub exposure. No mold exposure. No recent travel.   Social History   Tobacco Use  . Smoking status: Current Every Day Smoker    Packs/day: 0.50    Years: 46.00    Pack years: 23.00    Types: Cigarettes  . Smokeless tobacco: Never Used  Substance Use Topics  . Alcohol use: Yes    Alcohol/week: 0.0 oz    Comment: 09/18/2017 "might have 1 drink/month; if that"  . Drug use: No   family history includes Asthma in her maternal grandmother; Breast cancer in her maternal aunt; Heart disease in her father; Hyperlipidemia in her father; Hypertension in her father; Stroke in her mother.  Objective:  BP 120/70   Pulse 91   Temp 98 F (36.7 C) (Oral)   Resp 16   Ht 5' 2.99" (1.6 m)   Wt 117 lb (53.1 kg)   SpO2 94%   BMI 20.73 kg/m  Body mass index is 20.73 kg/m.  Wt Readings from Last 3 Encounters:  05/21/18 117 lb (53.1 kg)  03/27/18 122 lb 9.6 oz (55.6 kg)  02/19/18 123 lb 6.4 oz (56 kg)    Physical Exam  Constitutional: She is oriented to person, place, and time. She appears well-developed and well-nourished.  HENT:  Head: Normocephalic and atraumatic.  Right Ear: Hearing and external ear normal.  Left Ear: Hearing and external ear normal.  Eyes: Conjunctivae are normal.  Neck: Normal range of motion.  Cardiovascular: Normal rate, regular rhythm and normal heart sounds.  No murmur heard. Pulmonary/Chest: Effort normal and breath sounds normal. She has no wheezes.  Neurological: She is alert and oriented to person, place, and time.  Skin: Skin is warm and dry.  Psychiatric: Judgment normal.  Vitals reviewed.   Assessment and Plan :  Muscle spasm - Plan: tiZANidine (ZANAFLEX) 2 MG tablet -continue this medication at night  Stage 2 moderate COPD by GOLD classification ([place]) -congratulated on smoking cessation  Insomnia secondary to depression with anxiety -   Mood disorder ([place]) -patient is tolerating gabapentin well and feels like her mood has  stabilized slightly.  We will increase the dose at night to 200 mg and continue the a.m. dose at 100 mg until she can see her psychiatrist within the next 3 weeks.  Chronic bilateral low back pain without sciatica  Anxiety associated with depression patient again asked for Xanax and I again reminded patient the last 2 months when we have done urine drug screen she has never had Xanax in her system, I am unwilling to refill it at this visit. -   Patient verbalized to me that they understand the following: diagnosis, what is being done for them, what to expect and what should be done at home.  Their questions have been answered.  See after visit summary for patient specific instructions.  Windell Hummingbird PA-C  [facility] 05/24/2018 7:46 AM  Please note: Portions of this report may have been transcribed using dragon voice recognition software. Every effort was made to ensure accuracy; however, inadvertent computerized transcription errors may be present.

## 2018-05-30 ENCOUNTER — Other Ambulatory Visit: Payer: Self-pay | Admitting: Family Medicine

## 2018-05-30 DIAGNOSIS — R600 Localized edema: Secondary | ICD-10-CM

## 2018-06-07 ENCOUNTER — Other Ambulatory Visit: Payer: Self-pay | Admitting: Physician Assistant

## 2018-06-07 DIAGNOSIS — M545 Low back pain, unspecified: Secondary | ICD-10-CM

## 2018-06-07 DIAGNOSIS — G8929 Other chronic pain: Secondary | ICD-10-CM

## 2018-06-08 ENCOUNTER — Other Ambulatory Visit: Payer: Self-pay

## 2018-06-08 NOTE — Telephone Encounter (Signed)
Noted in progress note of 05/21/18, that pt. was to f/u with Psychiatrist for continued dosing recommendation on Gabapentin.  Phone call to pharmacy regarding previous approval of Gabapentin 100 mg dose done in error; spoke with the Pharmacist; this Rx refill was d/c'd.  Phone call to pt.  Left voice message to inform that the Gabapentin was not refilled at this time, and to ask if she had followed up with Psychiatrist re: recommendations of this medication.  Left vm to call office with questions.

## 2018-06-08 NOTE — Telephone Encounter (Signed)
Refill approved.

## 2018-06-08 NOTE — Telephone Encounter (Signed)
Last refill on Gabapentin; 04/20/18; no refills  Last office visit 05/21/18

## 2018-06-09 ENCOUNTER — Other Ambulatory Visit: Payer: Self-pay | Admitting: Physician Assistant

## 2018-06-09 DIAGNOSIS — F418 Other specified anxiety disorders: Secondary | ICD-10-CM

## 2018-06-09 DIAGNOSIS — F5105 Insomnia due to other mental disorder: Principal | ICD-10-CM

## 2018-06-09 NOTE — Telephone Encounter (Signed)
Patient is requesting a refill of the following medications: Requested Prescriptions   Pending Prescriptions Disp Refills  . ALPRAZolam (XANAX) 0.5 MG tablet 30 tablet 0    Sig: TAKE 1 TABLET BY MOUTH EVERYDAY AT BEDTIME    Date of patient request: 06/09/2018 Last office visit: 05/21/2018 Date of last refill: 03/27/2018 Last refill amount: 30 Follow up time period per chart:

## 2018-06-09 NOTE — Telephone Encounter (Signed)
Xanax 0.5mg  refill. Pt states that Dr. Phil Dopp is on medical leave and will not be able to see the pt until 08/20/18.  Last OV: 05/21/18 PCP: Smith Robert [facility] [address]

## 2018-06-09 NOTE — Telephone Encounter (Signed)
Copied from [place] [id number]. Topic: Quick Communication - Rx Refill/Question >> Jun 09, 2018 10:02 AM Synthia Innocent wrote: Medication: ALPRAZolam Duanne Moron) 0.5 MG tablet, Dr Phil Dopp is on medical leave and will not be able to see him till Sept 20  Has the patient contacted their pharmacy? Yes.   (Agent: If no, request that the patient contact the pharmacy for the refill.) (Agent: If yes, when and what did the pharmacy advise?)  Preferred Pharmacy (with phone number or street name[facility] [address]  Agent: Please be advised that RX refills may take up to 3 business days. We ask that you follow-up with your pharmacy.

## 2018-06-10 ENCOUNTER — Ambulatory Visit (HOSPITAL_COMMUNITY): Payer: Self-pay | Admitting: Psychiatry

## 2018-06-11 ENCOUNTER — Ambulatory Visit (HOSPITAL_COMMUNITY): Payer: Self-pay | Admitting: Psychiatry

## 2018-06-12 ENCOUNTER — Other Ambulatory Visit: Payer: Self-pay | Admitting: Physician Assistant

## 2018-06-12 DIAGNOSIS — F418 Other specified anxiety disorders: Secondary | ICD-10-CM

## 2018-06-12 DIAGNOSIS — F5105 Insomnia due to other mental disorder: Principal | ICD-10-CM

## 2018-06-14 NOTE — Telephone Encounter (Signed)
Xanax refill Last Refill:03/27/18 #30 Last OV: 05/21/18 PCP: Smith Robert

## 2018-06-15 ENCOUNTER — Ambulatory Visit: Payer: Self-pay | Admitting: *Deleted

## 2018-06-15 ENCOUNTER — Other Ambulatory Visit: Payer: Self-pay

## 2018-06-15 ENCOUNTER — Ambulatory Visit (INDEPENDENT_AMBULATORY_CARE_PROVIDER_SITE_OTHER): Payer: Medicare Other | Admitting: Family Medicine

## 2018-06-15 ENCOUNTER — Encounter: Payer: Self-pay | Admitting: Family Medicine

## 2018-06-15 VITALS — Temp 98.0°F | Ht 62.99 in | Wt 120.8 lb

## 2018-06-15 DIAGNOSIS — I10 Essential (primary) hypertension: Secondary | ICD-10-CM | POA: Diagnosis not present

## 2018-06-15 DIAGNOSIS — F4321 Adjustment disorder with depressed mood: Secondary | ICD-10-CM | POA: Diagnosis not present

## 2018-06-15 DIAGNOSIS — M5416 Radiculopathy, lumbar region: Secondary | ICD-10-CM | POA: Diagnosis not present

## 2018-06-15 DIAGNOSIS — R42 Dizziness and giddiness: Secondary | ICD-10-CM

## 2018-06-15 DIAGNOSIS — R600 Localized edema: Secondary | ICD-10-CM

## 2018-06-15 DIAGNOSIS — I872 Venous insufficiency (chronic) (peripheral): Secondary | ICD-10-CM

## 2018-06-15 MED ORDER — HYDROCODONE-ACETAMINOPHEN 5-325 MG PO TABS: 1.0000 | ORAL_TABLET | Freq: Four times a day (QID) | ORAL | 0 refills | Status: AC | PRN

## 2018-06-15 NOTE — Telephone Encounter (Signed)
Pt called with having swelling in legs and feet since last week. They are red, not tender to touch. She thinks that she has been on her feet a lot more since the death of her son in law. They are red. And she does not want them to get like they had gotten before, started leaking fluid and ended up in the hospital.  Appointment scheduled per protocol and pt request.  Pt's son in law Clifton James) was killed in a car accident last week. She is not resting, or sleeping. Requesting to get a prescription for alprazolam. She had been prescribed 0.5 mg tab at bedtime daily. The funeral will be this Friday and visitation will be on Thursday. Flow at [facility] notified.  Pt advised to call back for worsening symptoms, voiced understanding.  Reason for Disposition . [1] Thigh, calf, or ankle swelling AND [2] bilateral AND [3] 1 side is more swollen  Answer Assessment - Initial Assessment Questions 1. ONSET: "When did the swelling start?" (e.g., minutes, hours, days)     Last week 2. LOCATION: "What part of the leg is swollen?"  "Are both legs swollen or just one leg?"     Up to the calf from feet 3. SEVERITY: "How bad is the swelling?" (e.g., localized; mild, moderate, severe)  - Localized - small area of swelling localized to one leg  - MILD pedal edema - swelling limited to foot and ankle, pitting edema < 1/4 inch (6 mm) deep, rest and elevation eliminate most or all swelling  - MODERATE edema - swelling of lower leg to knee, pitting edema > 1/4 inch (6 mm) deep, rest and elevation only partially reduce swelling  - SEVERE edema - swelling extends above knee, facial or hand swelling present      mild 4. REDNESS: "Does the swelling look red or infected?"     red 5. PAIN: "Is the swelling painful to touch?" If so, ask: "How painful is it?"   (Scale 1-10; mild, moderate or severe)     no 6. FEVER: "Do you have a fever?" If so, ask: "What is it, how was it measured, and when did it start?"    no 7. CAUSE: "What do you think is causing the leg swelling?"      Standing on feet since last week 8. MEDICAL HISTORY: "Do you have a history of heart failure, kidney disease, liver failure, or cancer?"     no 9. RECURRENT SYMPTOM: "Have you had leg swelling before?" If so, ask: "When was the last time?" "What happened that time?"     Yes and had to be hospitalized 10. OTHER SYMPTOMS: "Do you have any other symptoms?" (e.g., chest pain, difficulty breathing)       no  Protocols used: LEG SWELLING AND EDEMA-A-AH

## 2018-06-15 NOTE — Progress Notes (Signed)
7/16/20195:57 PM  Catherine Kelley [birth year], 67 y.o. female [id number]  Chief Complaint  Patient presents with  . Edema    swelling in the legs and feet. Says it may be due to all the standing. Has 2 pinched nerves in the back and sciatica. Wants pain meds to cope. Has anxiety due to death of son-in-law last week.    HPI:   Patient is a 67 y.o. female with past medical history significant for mood disorder, htn, varicose veins, sciatica who presents today with several concerns  Her son in law died immediately in a car accident a week and a half ago She has been doing lots of standing since then and has had flare up of right sciatica and swelling of both feet and lower legs Already on gabapentin and TCA for mood, tizanadine and spironolactone She reports coping as well as expected regarding death of son-in law. There is good support She also not seeing psychiatry anymore as she is now on medicare and per patient, unable to find someone that takes medicare   Fall Risk  06/15/2018 05/21/2018 03/27/2018 02/19/2018 01/16/2018  Falls in the past year? No No Yes Yes Yes  Comment - - - - last week, injured right arm  Number falls in past yr: - - 2 or more 1 -  Injury with Fall? - - No Yes -  Follow up - - Falls evaluation completed - -     Depression screen [facility] 2/9 06/15/2018 05/21/2018 03/27/2018  Decreased Interest 0 2 0  Down, Depressed, Hopeless 0 2 0  PHQ - 2 Score 0 4 0  Altered sleeping - 1 -  Tired, decreased energy - 1 -  Change in appetite - 0 -  Feeling bad or failure about yourself  - 1 -  Trouble concentrating - 0 -  Moving slowly or fidgety/restless - 0 -  Suicidal thoughts - 0 -  PHQ-9 Score - 7 -  Some recent data might be hidden    Allergies  Allergen Reactions  . Sulfa Drugs Cross Reactors Hives and Rash    Hives and rash  . Ceclor [Cefaclor] Hives    Tolerated ceftazidime December 2016  . Depakote Er [Divalproex Sodium Er] Other (See Comments)    Patient  remarked that when she was taking this TWICE a day, she became "clumsy" and felt more prone to stumbling  . Escitalopram Oxalate Other (See Comments)    Pt does not recall ever taking medication (??)  . Sertraline Hcl     Severe headache    Prior to Admission medications   Medication Sig Start Date End Date Taking? Authorizing Provider  albuterol (PROVENTIL) (2.5 MG/3ML) 0.083% nebulizer solution Albuterol nebulizer  4 TIMES DAILY 04/29/18  Yes Weber, Damaris Hippo, PA-C  ALPRAZolam Duanne Moron) 0.5 MG tablet TAKE 1 TABLET BY MOUTH EVERYDAY AT BEDTIME 03/27/18  Yes Weber, Damaris Hippo, PA-C  aspirin EC 81 MG tablet Take 81-162 mg by mouth See admin instructions. 81 mg once a day "for heart health" and 162 mg one to two times a day as needed for headaches   Yes [provider]  budesonide-formoterol (SYMBICORT) 160-4.5 MCG/ACT inhaler Inhale 2 puffs into the lungs 2 (two) times daily. 09/19/17  Yes Patrecia Pour, Christean Grief, MD  calcium carbonate (TUMS EX) 750 MG chewable tablet Chew 1 tablet by mouth 2 (two) times daily as needed for heartburn.   Yes [provider]  divalproex (DEPAKOTE ER) 500 MG 24 hr tablet  TAKE 1 TABLET BY MOUTH EVERYDAY AT BEDTIME 05/20/18  Yes Weber, Sarah L, PA-C  fluticasone (FLONASE) 50 MCG/ACT nasal spray Place 2 sprays into both nostrils daily. 02/19/18  Yes Weber, Damaris Hippo, PA-C  gabapentin (NEURONTIN) 100 MG capsule 1 po qam, 2 po qhs 04/20/18  Yes Weber, Sarah L, PA-C  nortriptyline (PAMELOR) 50 MG capsule TAKE 2 CAPSULES BY MOUTH NIGHTLY 05/17/18  Yes Weber, Sarah L, PA-C  spironolactone (ALDACTONE) 50 MG tablet TAKE 1 TABLET (50 MG TOTAL) BY MOUTH DAILY. OFFICE VISIT NEEDED FOR REFILLS 05/31/18  Yes Weber, Sarah L, PA-C  tiZANidine (ZANAFLEX) 2 MG tablet Take 1 tablet (2 mg total) by mouth at bedtime. 05/21/18  Yes Weber, Sarah L, PA-C  traMADol (ULTRAM) 50 MG tablet Take 1 tablet (50 mg total) by mouth daily as needed. 03/27/18  Yes Weber, Sarah L, PA-C  triamcinolone cream  (KENALOG) 0.1 % Apply 1 application topically 2 (two) times daily. 01/16/18  Yes Weber, Damaris Hippo, PA-C    Past Medical History:  Diagnosis Date  . Anxiety   . Arthritis    "jooints" (09/18/2017)  . Asthma   . Bipolar disorder ([place])   . CAP (community acquired pneumonia) 09/18/2017  . Cat allergies   . Chronic bronchitis ([place])    "get it alot; maybe not q yr" (07/06/2014)  . Chronic lower back pain    "spurs and pinched nerves" (09/18/2017)  . COPD (chronic obstructive pulmonary disease) ([place])   . DDD (degenerative disc disease), lumbar   . Depression    psychiatrist Dr. Toy Care  . Diastolic dysfunction   . Emphysematous COPD ([place])    changes in right base along with patchy areas on last x-ray  . Falls frequently    "several in 2017; fell at dr's office yesterday" (09/18/2017)  . GERD (gastroesophageal reflux disease)   . HCAP (healthcare-associated pneumonia) 02/12/2016  . Heart murmur   . History of cardiovascular stress test 08/15/2004   EF of 70% -- Normal stress cardiolite.  There is no evidence of ischemia and there is normal LV function. -- [name]. MD  . History of echocardiogram 12/24/2006   Est. EF of 43-32% NORMAL LV SYSTOLIC FUNCTION WITH IMPAIRED RELAXATION -- MILD AORTIC SCLEROSIS -- NORMAL PALONARY ARTERY PRESSURE -- NO OLD ECHOS FOR COMPARISON -- [name], MD  . Hypertension    essential hypertension  . Migraine    "when I was having my periods; none since then" (09/18/2017)  . Necrotic pneumonia ([place])    recurrent/notes 02/12/2016  . Pneumonia    "several times since May 2015" (07/06/2014)  . Pollen allergies   . Tobacco abuse    smoking about 1/2 pk of cigarettes a day    Past Surgical History:  Procedure Laterality Date  . DILATION AND CURETTAGE OF UTERUS    . TUBAL LIGATION  1986    Social History   Tobacco Use  . Smoking status: Current Every Day Smoker    Packs/day: 0.50    Years: 46.00    Pack years: 23.00    Types: Cigarettes  .  Smokeless tobacco: Never Used  Substance Use Topics  . Alcohol use: Yes    Alcohol/week: 0.0 oz    Comment: 09/18/2017 "might have 1 drink/month; if that"    Family History  Problem Relation Age of Onset  . Stroke Mother   . Heart disease Father   . Hyperlipidemia Father   . Hypertension Father   . Breast cancer Maternal Aunt   .  Asthma Maternal Grandmother     Review of Systems  Constitutional: Negative for chills and fever.  Respiratory: Negative for cough and shortness of breath.   Cardiovascular: Positive for leg swelling. Negative for chest pain, palpitations, orthopnea and PND.  Gastrointestinal: Negative for abdominal pain, nausea and vomiting.  Musculoskeletal: Positive for back pain and falls.  Neurological: Positive for dizziness and tingling.  Endo/Heme/Allergies: Bruises/bleeds easily.     OBJECTIVE:  Blood pressure (!) 158/76, pulse 87, temperature 98 F (36.7 C), temperature source Oral, height 5' 2.99" (1.6 m), weight 120 lb 12.8 oz (54.8 kg), SpO2 95 %.  BP Readings from Last 3 Encounters:  05/21/18 120/70  03/27/18 124/78  02/28/18 (!) 148/90   Lying 158/76, 78 Sitting 157/77, 77 Standing 149/79, 86, got dizzy  Physical Exam  Constitutional: She is oriented to person, place, and time. She appears well-developed and well-nourished.  HENT:  Head: Normocephalic and atraumatic.  Mouth/Throat: Oropharynx is clear and moist. No oropharyngeal exudate.  Eyes: Pupils are equal, round, and reactive to light. EOM are normal. No scleral icterus.  Neck: Neck supple.  Cardiovascular: Normal rate, regular rhythm and intact distal pulses. Exam reveals no gallop and no friction rub.  Murmur heard. Pulmonary/Chest: Effort normal and breath sounds normal. She has no wheezes. She has no rales.  Musculoskeletal: She exhibits edema (+2 pitting upto distal calf with slight erythema but no warmth, + varicose veins present).  Neurological: She is alert and oriented to  person, place, and time.  Skin: Skin is warm and dry.  Multiple bruises over extensor surfaces of forearm  Psychiatric: She has a normal mood and affect.  Nursing note and vitals reviewed.   ASSESSMENT and PLAN  1. Bilateral lower extremity edema Most likely 2/2 venous insufficiency however will eval for CHF - Brain natriuretic peptide - Comprehensive metabolic panel  2. Venous insufficiency (chronic) (peripheral) Discussed supportive measures, OTC compression stockings   3. Essential hypertension Above goal today, previously well controlled. Upset due to recent death. Recheck at next appt  4. Grief Discussed support systems  5. Lumbar back pain with radiculopathy affecting right lower extremity [place] CSR reviewed. alreaady on gabapentin and TCA.  - HYDROcodone-acetaminophen (NORCO/VICODIN) 5-325 MG tablet; Take 1 tablet by mouth every 6 (six) hours as needed for up to 5 days for moderate pain.  Return in about 2 weeks (around 06/29/2018), or with Weber (PCP) for BP and grief.    Rutherford Guys, MD [facility] [address], [place] [address] Ph.  [phone] Fax [phone]

## 2018-06-15 NOTE — Telephone Encounter (Signed)
Patient wants to know will this be refilled because one encounter says denied and one says pending? Please advise.

## 2018-06-15 NOTE — Patient Instructions (Addendum)
1. Elevate legs several times a day. Try over the counter compression stockings (travel socks) 2. Be mindful of not standing too quickly   IF you received an x-ray today, you will receive an invoice from [facility]. Please contact [facility] at [phone] with questions or concerns regarding your invoice.   IF you received labwork today, you will receive an invoice from [facility]. Please contact [facility] at [phone] with questions or concerns regarding your invoice.   Our billing staff will not be able to assist you with questions regarding bills from these companies.  You will be contacted with the lab results as soon as they are available. The fastest way to get your results is to activate your My Chart account. Instructions are located on the last page of this paperwork. If you have not heard from [name] regarding the results in 2 weeks, please contact this office.     Edema Edema is when you have too much fluid in your body or under your skin. Edema may make your legs, feet, and ankles swell up. Swelling is also common in looser tissues, like around your eyes. This is a common condition. It gets more common as you get older. There are many possible causes of edema. Eating too much salt (sodium) and being on your feet or sitting for a long time can cause edema in your legs, feet, and ankles. Hot weather may make edema worse. Edema is usually painless. Your skin may look swollen or shiny. Follow these instructions at home:  Keep the swollen body part raised (elevated) above the level of your heart when you are sitting or lying down.  Do not sit still or stand for a long time.  Do not wear tight clothes. Do not wear garters on your upper legs.  Exercise your legs. This can help the swelling go down.  Wear elastic bandages or support stockings as told by your doctor.  Eat a low-salt (low-sodium) diet to reduce fluid as told by your doctor.  Depending on the cause  of your swelling, you may need to limit how much fluid you drink (fluid restriction).  Take over-the-counter and prescription medicines only as told by your doctor. Contact a doctor if:  Treatment is not working.  You have heart, liver, or kidney disease and have symptoms of edema.  You have sudden and unexplained weight gain. Get help right away if:  You have shortness of breath or chest pain.  You cannot breathe when you lie down.  You have pain, redness, or warmth in the swollen areas.  You have heart, liver, or kidney disease and get edema all of a sudden.  You have a fever and your symptoms get worse all of a sudden. Summary  Edema is when you have too much fluid in your body or under your skin.  Edema may make your legs, feet, and ankles swell up. Swelling is also common in looser tissues, like around your eyes.  Raise (elevate) the swollen body part above the level of your heart when you are sitting or lying down.  Follow your doctor's instructions about diet and how much fluid you can drink (fluid restriction). This information is not intended to replace advice given to you by your health care provider. Make sure you discuss any questions you have with your health care provider. Document Released: 05/05/2008 Document Revised: 12/05/2016 Document Reviewed: 12/05/2016 Elsevier Interactive Patient Education  2017 [employer].

## 2018-06-15 NOTE — Telephone Encounter (Signed)
Pt is not longer on this - if she needs if for short term has she asked her pyschiatrist?

## 2018-06-15 NOTE — Telephone Encounter (Signed)
Pt has had death in her family (son-in-law). Viewing is Thursday and Remer Macho is on Friday this week.

## 2018-06-15 NOTE — Telephone Encounter (Signed)
Please see note below. 

## 2018-06-16 ENCOUNTER — Telehealth: Payer: Self-pay | Admitting: Physician Assistant

## 2018-06-16 NOTE — Telephone Encounter (Signed)
Copied from [place] [id number]. Topic: Quick Communication - See Telephone Encounter >> Jun 16, 2018  9:11 AM Synthia Innocent wrote: CRM for notification. See Telephone encounter for: 06/16/18. Patient states that xanax was to be called in yesterday, pharmacy has not received anything, [place]

## 2018-06-16 NOTE — Telephone Encounter (Signed)
Pt requesting refill of Xanax not in current med profile.  LOV 06/15/18 [name]  [facility] 03/27/18  #30  0 refills  [facility] [address]

## 2018-06-17 ENCOUNTER — Other Ambulatory Visit: Payer: Self-pay | Admitting: Physician Assistant

## 2018-06-17 DIAGNOSIS — F418 Other specified anxiety disorders: Secondary | ICD-10-CM

## 2018-06-17 DIAGNOSIS — F5105 Insomnia due to other mental disorder: Principal | ICD-10-CM

## 2018-06-17 NOTE — Telephone Encounter (Signed)
Message re: xanax refill sent to [name] Last refill was per Judson Roch 03/2018

## 2018-06-18 LAB — COMPREHENSIVE METABOLIC PANEL

## 2018-06-18 LAB — BRAIN NATRIURETIC PEPTIDE

## 2018-06-21 NOTE — Telephone Encounter (Signed)
Declined. Catherine Kelley had recently declined this medication due to negative urine drug screens x 2.

## 2018-06-22 NOTE — Telephone Encounter (Signed)
Spoke with pt about why her Rx request was denied, she verbalized understanding.

## 2018-06-22 NOTE — Telephone Encounter (Signed)
Called pt about Rx request and LVM for pt to call office back about questions to why her request was denied.

## 2018-06-26 ENCOUNTER — Ambulatory Visit: Payer: Self-pay | Admitting: Physician Assistant

## 2018-07-16 ENCOUNTER — Ambulatory Visit (INDEPENDENT_AMBULATORY_CARE_PROVIDER_SITE_OTHER): Payer: Medicare Other | Admitting: Physician Assistant

## 2018-07-16 ENCOUNTER — Other Ambulatory Visit: Payer: Self-pay

## 2018-07-16 ENCOUNTER — Encounter: Payer: Self-pay | Admitting: Physician Assistant

## 2018-07-16 DIAGNOSIS — M545 Low back pain, unspecified: Secondary | ICD-10-CM

## 2018-07-16 DIAGNOSIS — G8929 Other chronic pain: Secondary | ICD-10-CM | POA: Diagnosis not present

## 2018-07-16 DIAGNOSIS — F418 Other specified anxiety disorders: Secondary | ICD-10-CM | POA: Diagnosis not present

## 2018-07-16 DIAGNOSIS — M62838 Other muscle spasm: Secondary | ICD-10-CM | POA: Diagnosis not present

## 2018-07-16 MED ORDER — DIVALPROEX SODIUM ER 500 MG PO TB24: ORAL_TABLET | ORAL | 0 refills | Status: DC

## 2018-07-16 MED ORDER — TIZANIDINE HCL 4 MG PO TABS: 4.0000 mg | ORAL_TABLET | Freq: Every day | ORAL | 0 refills | Status: DC

## 2018-07-16 MED ORDER — GABAPENTIN 100 MG PO CAPS: ORAL_CAPSULE | ORAL | 0 refills | Status: DC

## 2018-07-16 MED ORDER — NORTRIPTYLINE HCL 50 MG PO CAPS: ORAL_CAPSULE | ORAL | 0 refills | Status: DC

## 2018-07-16 NOTE — Progress Notes (Signed)
Catherine Kelley  MRN: [id number] DOB: 1951/02/23  PCP: Catherine Bale, PA-C  Chief Complaint  Patient presents with  . Medication Refill    depakote and gabapentin     Subjective:  Pt presents to clinic for medication refills.  She is also having trouble sleeping and feels like she is withdrawing from Xanax.  She also needs refills of her gabapentin as she has run out of that weeks ago.  Upon questioning patient states that this does not help her sleep and she is not sure it helps her mood.  Though the last phone conversation I had with patient this did seem to help her.  Her back still causes a lot of problems for her the reduced dose of Zanaflex is less effective and she would like to go back to the 4 mg.  9/20 appt with pyschiatry her appointment in July was canceled due to provider being out.  She has run out of her Neurontin but has continued taking the Depakote.    Son-in law was just killed in July and this is been very hard for the family.  History is obtained by patient.  Review of Systems  Psychiatric/Behavioral: Positive for dysphoric mood and sleep disturbance. The patient is nervous/anxious.     Patient Active Problem List   Diagnosis Date Noted  . CAP (community acquired pneumonia) 09/17/2017  . Near syncope 09/17/2017  . Tobacco abuse 02/12/2016  . Other fatigue 02/04/2016  . Respiratory failure with hypoxia ([place]) 01/09/2016  . Malnutrition of moderate degree 11/03/2015  . Pulmonary emphysema ([place])   . Essential hypertension 09/27/2015  . Rhabdomyolysis 08/16/2015  . Chronic cough 06/07/2015  . Bronchiectasis with acute exacerbation ([place]) 03/07/2015  . Cigarette smoker 12/13/2014  . COPD GOLD II/ still smoking 10/11/2014  . Lumbar back pain with radiculopathy affecting right lower extremity 09/28/2014  . Hyponatremia 07/09/2014  . Hx of recurrent pneumonia 07/08/2014  . Venous insufficiency (chronic) (peripheral) 09/29/2012  . Lower extremity edema 03/09/2012    . Benign hypertensive heart disease without heart failure 05/09/2011  . Chest pain 05/09/2011  . Dyslipidemia 05/09/2011  . Mood disorder ([place]) 05/09/2011  . Osteoarthritis 05/09/2011    Current Outpatient Medications on File Prior to Visit  Medication Sig Dispense Refill  . albuterol (PROVENTIL) (2.5 MG/3ML) 0.083% nebulizer solution Albuterol nebulizer  4 TIMES DAILY 150 mL 0  . aspirin EC 81 MG tablet Take 81-162 mg by mouth See admin instructions. 81 mg once a day "for heart health" and 162 mg one to two times a day as needed for headaches    . budesonide-formoterol (SYMBICORT) 160-4.5 MCG/ACT inhaler Inhale 2 puffs into the lungs 2 (two) times daily. 1 Inhaler 12  . calcium carbonate (TUMS EX) 750 MG chewable tablet Chew 1 tablet by mouth 2 (two) times daily as needed for heartburn.    . fluticasone (FLONASE) 50 MCG/ACT nasal spray Place 2 sprays into both nostrils daily. 16 g 6  . spironolactone (ALDACTONE) 50 MG tablet TAKE 1 TABLET (50 MG TOTAL) BY MOUTH DAILY. OFFICE VISIT NEEDED FOR REFILLS 90 tablet 0  . triamcinolone cream (KENALOG) 0.1 % Apply 1 application topically 2 (two) times daily. 30 g 0   No current facility-administered medications on file prior to visit.     Allergies  Allergen Reactions  . Sulfa Drugs Cross Reactors Hives and Rash    Hives and rash  . Ceclor [Cefaclor] Hives    Tolerated ceftazidime December 2016  . Depakote  Er [Divalproex Sodium Er] Other (See Comments)    Patient remarked that when she was taking this TWICE a day, she became "clumsy" and felt more prone to stumbling  . Escitalopram Oxalate Other (See Comments)    Pt does not recall ever taking medication (??)  . Sertraline Hcl     Severe headache    Past Medical History:  Diagnosis Date  . Anxiety   . Arthritis    "jooints" (09/18/2017)  . Asthma   . Bipolar disorder ([place])   . CAP (community acquired pneumonia) 09/18/2017  . Cat allergies   . Chronic bronchitis ([place])    "get it  alot; maybe not q yr" (07/06/2014)  . Chronic lower back pain    "spurs and pinched nerves" (09/18/2017)  . COPD (chronic obstructive pulmonary disease) ([place])   . DDD (degenerative disc disease), lumbar   . Depression    psychiatrist Dr. Toy Care  . Diastolic dysfunction   . Emphysematous COPD ([place])    changes in right base along with patchy areas on last x-ray  . Falls frequently    "several in 2017; fell at dr's office yesterday" (09/18/2017)  . GERD (gastroesophageal reflux disease)   . HCAP (healthcare-associated pneumonia) 02/12/2016  . Heart murmur   . History of cardiovascular stress test 08/15/2004   EF of 70% -- Normal stress cardiolite.  There is no evidence of ischemia and there is normal LV function. -- [name]. MD  . History of echocardiogram 12/24/2006   Est. EF of 96-78% NORMAL LV SYSTOLIC FUNCTION WITH IMPAIRED RELAXATION -- MILD AORTIC SCLEROSIS -- NORMAL PALONARY ARTERY PRESSURE -- NO OLD ECHOS FOR COMPARISON -- [name], MD  . Hypertension    essential hypertension  . Migraine    "when I was having my periods; none since then" (09/18/2017)  . Necrotic pneumonia ([place])    recurrent/notes 02/12/2016  . Pneumonia    "several times since May 2015" (07/06/2014)  . Pollen allergies   . Tobacco abuse    smoking about 1/2 pk of cigarettes a day   Social History   Social History Narrative   Patient currently lives with her husband.    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 No bird or hot tub exposure. No mold exposure. No recent travel.   Social History   Tobacco Use  . Smoking status: Current Every Day Smoker    Packs/day: 0.50    Years: 46.00    Pack years: 23.00    Types: Cigarettes  . Smokeless tobacco: Never Used  Substance Use Topics  . Alcohol use: Yes    Alcohol/week: 0.0 standard drinks    Comment: 09/18/2017 "might have 1 drink/month; if that"  .  Drug use: No   family history includes Asthma in her maternal grandmother; Breast cancer in her maternal aunt; Heart disease in her father; Hyperlipidemia in her father; Hypertension in her father; Stroke in her mother.     Objective:  BP 108/62   Pulse 85   Temp 97.9 F (36.6 C) (Oral)   Resp 18   Ht 5' 2.99" (1.6 m)   Wt 115 lb 6.4 oz (52.3 kg)   SpO2 96%   BMI 20.45 kg/m  Body mass index is 20.45 kg/m.  Wt Readings from Last 3 Encounters:  07/16/18 115 lb 6.4 oz (52.3 kg)  06/15/18 120  lb 12.8 oz (54.8 kg)  05/21/18 117 lb (53.1 kg)    Physical Exam  Constitutional: She is oriented to person, place, and time.  HENT:  Head: Normocephalic and atraumatic.  Right Ear: Hearing and external ear normal.  Left Ear: Hearing and external ear normal.  Eyes: Conjunctivae are normal.  Neck: Normal range of motion.  Cardiovascular: Normal rate, regular rhythm and normal heart sounds.  No murmur heard. Pulmonary/Chest: Effort normal and breath sounds normal. She has no wheezes.  Neurological: She is alert and oriented to person, place, and time.  Skin: Skin is warm and dry.  Psychiatric: Judgment normal.  Vitals reviewed.   Assessment and Plan :  Muscle spasm - Plan: tiZANidine (ZANAFLEX) 4 MG tablet -patient is no longer on any sedating medications at night other than this 1 so we will increase her dose to 4 mg as this helps her sleep due to her decreased back pain  Anxiety associated with depression - Plan: nortriptyline (PAMELOR) 50 MG capsule, divalproex (DEPAKOTE ER) 500 MG 24 hr tablet,gabapentin (NEURONTIN) 100 MG capsule -again reminded the patient that when we did drug screens in the past they were both negative for Xanax therefore withdrawal of Xanax is not less likely and therefore we discussed again that I will not refill this medication.  Patient excepted this very quickly. -continue gabapentin though today she says does not help in the past she says has helped.   Encouraged patient strongly to follow-up with psychiatry as I suspect that she has bipolar disorder and needs psychiatry to follow this.  Chronic bilateral low back pain without sciatica - Plan: Zanaflex at night  Patient verbalized to me that they understand the following: diagnosis, what is being done for them, what to expect and what should be done at home.  Their questions have been answered.  See after visit summary for patient specific instructions.  Windell Hummingbird PA-C  [facility] 07/16/2018 9:23 AM  Please note: Portions of this report may have been transcribed using dragon voice recognition software. Every effort was made to ensure accuracy; however, inadvertent computerized transcription errors may be present.

## 2018-07-16 NOTE — Patient Instructions (Addendum)
   [facility] - [address], [place], [place] [address] Phone: [phone]   If you have lab work done today you will be contacted with your lab results within the next 2 weeks.  If you have not heard from us then please contact us. The fastest way to get your results is to register for My Chart.   IF you received an x-ray today, you will receive an invoice from [facility]. Please contact [facility] at [phone] with questions or concerns regarding your invoice.   IF you received labwork today, you will receive an invoice from [facility]. Please contact [facility] at [phone] with questions or concerns regarding your invoice.   Our billing staff will not be able to assist you with questions regarding bills from these companies.  You will be contacted with the lab results as soon as they are available. The fastest way to get your results is to activate your My Chart account. Instructions are located on the last page of this paperwork. If you have not heard from us regarding the results in 2 weeks, please contact this office.     

## 2018-08-20 ENCOUNTER — Encounter

## 2018-08-20 ENCOUNTER — Ambulatory Visit (HOSPITAL_COMMUNITY): Payer: Self-pay | Admitting: Psychiatry

## 2018-09-06 ENCOUNTER — Other Ambulatory Visit: Payer: Self-pay | Admitting: Physician Assistant

## 2018-09-06 DIAGNOSIS — R600 Localized edema: Secondary | ICD-10-CM

## 2018-09-07 NOTE — Telephone Encounter (Signed)
Pt taking last dose of spirinolactone tonight and willnot have any for tomorrow. Requesting refill/ Appt scheduled with new provider 09/11/18  [id number] [place] [place] [facility] [address]

## 2018-09-11 ENCOUNTER — Encounter: Payer: Self-pay | Admitting: Family Medicine

## 2018-09-11 ENCOUNTER — Ambulatory Visit (INDEPENDENT_AMBULATORY_CARE_PROVIDER_SITE_OTHER): Payer: Medicare Other | Admitting: Family Medicine

## 2018-09-11 VITALS — BP 136/76 | HR 85 | Temp 97.7°F | Resp 16 | Ht 62.5 in | Wt 121.2 lb

## 2018-09-11 DIAGNOSIS — Z23 Encounter for immunization: Secondary | ICD-10-CM | POA: Diagnosis not present

## 2018-09-11 DIAGNOSIS — L853 Xerosis cutis: Secondary | ICD-10-CM | POA: Diagnosis not present

## 2018-09-11 DIAGNOSIS — L309 Dermatitis, unspecified: Secondary | ICD-10-CM | POA: Diagnosis not present

## 2018-09-11 DIAGNOSIS — R05 Cough: Secondary | ICD-10-CM | POA: Diagnosis not present

## 2018-09-11 DIAGNOSIS — R233 Spontaneous ecchymoses: Secondary | ICD-10-CM | POA: Diagnosis not present

## 2018-09-11 DIAGNOSIS — F17210 Nicotine dependence, cigarettes, uncomplicated: Secondary | ICD-10-CM | POA: Diagnosis not present

## 2018-09-11 DIAGNOSIS — R059 Cough, unspecified: Secondary | ICD-10-CM

## 2018-09-11 MED ORDER — AZITHROMYCIN 250 MG PO TABS: ORAL_TABLET | ORAL | 0 refills | Status: DC

## 2018-09-11 MED ORDER — TRIAMCINOLONE ACETONIDE 0.1 % EX CREA: 1.0000 "application " | TOPICAL_CREAM | Freq: Two times a day (BID) | CUTANEOUS | 1 refills | Status: DC

## 2018-09-11 NOTE — Patient Instructions (Addendum)
You have had both types of pneumonia vaccination, in 2016, and do not need any more at this time.  You had your influenza vaccination today and should get it yearly  The best way to prevent respiratory tract infections and you would be to quit smoking.  That is far more important than getting your vaccinations.  Nobody can make you quit but you, and I strongly advised that you do so.  Take the azithromycin (Z-Pak) 2 pills initially then 1 daily for 4 days for the cough and infection.  This is allergy season, and if you continue to have a lot of drainage you should take an antihistamine such as Allegra (fexofenadine) or Claritin (loratadine) or Zyrtec (cetirizine)  See your psychiatrist regarding your nerve medication needs.  I am refilling the triamcinolone cream to use on your hands when rash develops.  Whenever you have had your hands in dishwater you should use hand lotion.  Make yourself an appointment with 1 of the providers here to establish care with a regular provider since Judson Roch is now gone.  There is nothing that can be done about the easy bruising on your hands (senile ecchymoses) that come with age.  If you have lab work done today you will be contacted with your lab results within the next 2 weeks.  If you have not heard from [name] then please contact us. The fastest way to get your results is to register for My Chart.   IF you received an x-ray today, you will receive an invoice from [facility]. Please contact [facility] at [phone] with questions or concerns regarding your invoice.   IF you received labwork today, you will receive an invoice from [place]. Please contact [facility] at [phone] with questions or concerns regarding your invoice.   Our billing staff will not be able to assist you with questions regarding bills from these companies.  You will be contacted with the lab results as soon as they are available. The fastest way to get your  results is to activate your My Chart account. Instructions are located on the last page of this paperwork. If you have not heard from [name] regarding the results in 2 weeks, please contact this office.

## 2018-09-11 NOTE — Progress Notes (Signed)
Patient ID: Catherine Kelley, female    DOB: 04-25-1951  Age: 67 y.o. MRN: [id number]  Chief Complaint  Patient presents with  . Medication Refill    Subjective:   Patient is here because of her recurring rash of her hands.  She has been told it is because she puts her hands down in hot water too much.  She is a cigarette smoker.  She has had a cough since last Sunday.  It was clear but is now turned yellow and greenish.  She has not been particularly sick with it.  She bruises easily.  We discussed that.  Current allergies, medications, problem list, past/family and social histories reviewed.  Objective:  BP 136/76 (BP Location: Right Arm, Patient Position: Sitting, Cuff Size: Normal)   Pulse 85   Temp 97.7 F (36.5 C) (Oral)   Resp 16   Ht 5' 2.5" (1.588 m)   Wt 121 lb 3.2 oz (55 kg)   SpO2 95%   BMI 21.81 kg/m   No major acute distress.  Multiple senile ecchymoses on her hands and arms.  She has a little eczema on the back of her right hand.  Her throat is clear.  Neck supple without nodes.  Chest clear to auscultation.  Heart regular without murmur.  Assessment & Plan:   Assessment: 1. Hand eczema   2. Cough   3. Cigarette smoker   4. Immunization due   5. Senile ecchymosis       Plan: See instructions  Orders Placed This Encounter  Procedures  . Flu vaccine HIGH DOSE PF (Fluzone High dose)    No orders of the defined types were placed in this encounter.        Patient Instructions   You have had both types of pneumonia vaccination, in 2016, and do not need any more at this time.  You had your influenza vaccination today and should get it yearly  The best way to prevent respiratory tract infections and you would be to quit smoking.  That is far more important than getting your vaccinations.  Nobody can make you quit but you, and I strongly advised that you do so.  Take the azithromycin (Z-Pak) 2 pills initially then 1 daily for 4 days for the cough and  infection.  This is allergy season, and if you continue to have a lot of drainage you should take an antihistamine such as Allegra (fexofenadine) or Claritin (loratadine) or Zyrtec (cetirizine)  See your psychiatrist regarding your nerve medication needs.  I am refilling the triamcinolone cream to use on your hands when rash develops.  Whenever you have had your hands in dishwater you should use hand lotion.  Make yourself an appointment with 1 of the providers here to establish care with a regular provider since Judson Roch is now gone.  There is nothing that can be done about the easy bruising on your hands (senile ecchymoses) that come with age.  If you have lab work done today you will be contacted with your lab results within the next 2 weeks.  If you have not heard from [name] then please contact us. The fastest way to get your results is to register for My Chart.   IF you received an x-ray today, you will receive an invoice from [facility]. Please contact [facility] at [phone] with questions or concerns regarding your invoice.   IF you received labwork today, you will receive an invoice from [place]. Please contact [facility] at [phone]  with questions or concerns regarding your invoice.   Our billing staff will not be able to assist you with questions regarding bills from these companies.  You will be contacted with the lab results as soon as they are available. The fastest way to get your results is to activate your My Chart account. Instructions are located on the last page of this paperwork. If you have not heard from [name] regarding the results in 2 weeks, please contact this office.        Return if symptoms worsen or fail to improve.   Ruben Reason, MD 09/11/2018

## 2018-09-21 ENCOUNTER — Telehealth: Payer: Self-pay | Admitting: Family Medicine

## 2018-09-21 NOTE — Telephone Encounter (Signed)
Copied from [place] [id number]. Topic: Quick Communication - See Telephone Encounter >> Sep 21, 2018  8:48 AM Bea Graff, NT wrote: CRM for notification. See Telephone encounter for: 09/21/18. Pt states that sometimes different medicine make her dizzy and she takes dramamine for this. She would like to see if a rx for the dramamine can be wrote for her. She states that OTC it cost $8 and with a rx it will cost $1. [id number] [name], [place] (Phone) [phone] (Fax)

## 2018-09-21 NOTE — Telephone Encounter (Signed)
Patient was seen 09/11/2018, prescribed Z-Pak and was told take OTC Allegra, Claritin or Zyrtec. The patient stated sometimes different medicine make her dizzy and she takes Dramamine to help. She wants a prescription for Dramamine because it cost $1.00 with a prescription and the OTC cost $8.00. [facility] [address]. Please advise, thank you.

## 2018-09-22 ENCOUNTER — Telehealth: Payer: Self-pay | Admitting: Family Medicine

## 2018-09-22 NOTE — Telephone Encounter (Signed)
PEC pt message sent to scheduling. Please schedule asap for [name] Pt complaining of dizziness, wants rx for dramamine.

## 2018-09-22 NOTE — Telephone Encounter (Signed)
Copied from [place] [id number]. Topic: General - Other >> Sep 22, 2018  2:10 PM Leward Quan A wrote: Reason for CRM: Patient called to say that she have been experiencing periods of dizziness and was requesting an Rx for generic Dramamine because she have been taking the OTC brand and it help.  Please advise. Ph# [phone]

## 2018-09-23 MED ORDER — MECLIZINE HCL 12.5 MG PO TABS: 12.5000 mg | ORAL_TABLET | Freq: Two times a day (BID) | ORAL | 0 refills | Status: DC | PRN

## 2018-10-01 ENCOUNTER — Ambulatory Visit: Payer: Self-pay | Admitting: Family Medicine

## 2018-10-15 ENCOUNTER — Ambulatory Visit (INDEPENDENT_AMBULATORY_CARE_PROVIDER_SITE_OTHER): Payer: Medicare Other | Admitting: Family Medicine

## 2018-10-15 ENCOUNTER — Other Ambulatory Visit: Payer: Self-pay

## 2018-10-15 ENCOUNTER — Encounter: Payer: Self-pay | Admitting: Family Medicine

## 2018-10-15 VITALS — BP 134/74 | HR 81 | Temp 97.6°F | Resp 12 | Ht 63.0 in | Wt 119.8 lb

## 2018-10-15 DIAGNOSIS — M545 Low back pain: Secondary | ICD-10-CM | POA: Diagnosis not present

## 2018-10-15 DIAGNOSIS — R748 Abnormal levels of other serum enzymes: Secondary | ICD-10-CM | POA: Diagnosis not present

## 2018-10-15 DIAGNOSIS — D696 Thrombocytopenia, unspecified: Secondary | ICD-10-CM | POA: Diagnosis not present

## 2018-10-15 DIAGNOSIS — F39 Unspecified mood [affective] disorder: Secondary | ICD-10-CM

## 2018-10-15 DIAGNOSIS — Z5181 Encounter for therapeutic drug level monitoring: Secondary | ICD-10-CM | POA: Diagnosis not present

## 2018-10-15 DIAGNOSIS — G8929 Other chronic pain: Secondary | ICD-10-CM | POA: Diagnosis not present

## 2018-10-15 DIAGNOSIS — I10 Essential (primary) hypertension: Secondary | ICD-10-CM

## 2018-10-15 DIAGNOSIS — R42 Dizziness and giddiness: Secondary | ICD-10-CM | POA: Diagnosis not present

## 2018-10-15 MED ORDER — TIZANIDINE HCL 4 MG PO TABS: 4.0000 mg | ORAL_TABLET | Freq: Every day | ORAL | 1 refills | Status: DC

## 2018-10-15 NOTE — Progress Notes (Signed)
11/15/20194:35 PM  Catherine Kelley November 24, 1951, 67 y.o. female [id number]  Chief Complaint  Patient presents with  . Follow-up    Dizziness is a little better and have not had to take it all the time. Did take one last night. feels like water in my ear    HPI:   Patient is a 67 y.o. female with past medical history significant for mood disorder, htn, varicose veins, sciatica who presents today for dizziness  Dizziness better, from recent nasal congestion/URI Overall doing better, cough much improved Has needed meclizine only once, no recent falls Feels she has water in her left ear Reports normal hearing  Needs refill of tizanadine Takes for low back spasms Tolerates well, denies any adverse effects  Sees new psychiatrist tomorrow  Fall Risk  10/15/2018 06/15/2018 05/21/2018 03/27/2018 02/19/2018  Falls in the past year? - No No Yes Yes  Comment - - - - -  Number falls in past yr: 1 - - 2 or more 1  Injury with Fall? (No Data) - - No Yes  Comment broken skin - - - -  Risk for fall due to : History of fall(s) - - - -  Follow up - - - Falls evaluation completed -     Depression screen [facility] 2/9 10/15/2018 09/11/2018 06/15/2018  Decreased Interest 1 0 0  Down, Depressed, Hopeless 1 0 0  PHQ - 2 Score 2 0 0  Altered sleeping 2 - -  Tired, decreased energy 2 - -  Change in appetite 2 - -  Feeling bad or failure about yourself  0 - -  Trouble concentrating 1 - -  Moving slowly or fidgety/restless 0 - -  Suicidal thoughts 0 - -  PHQ-9 Score 9 - -  Some recent data might be hidden    Allergies  Allergen Reactions  . Sulfa Drugs Cross Reactors Hives and Rash    Hives and rash  . Ceclor [Cefaclor] Hives    Tolerated ceftazidime December 2016  . Depakote Er [Divalproex Sodium Er] Other (See Comments)    Patient remarked that when she was taking this TWICE a day, she became "clumsy" and felt more prone to stumbling  . Escitalopram Oxalate Other (See Comments)    Pt does not  recall ever taking medication (??)  . Sertraline Hcl     Severe headache    Prior to Admission medications   Medication Sig Start Date End Date Taking? Authorizing Provider  albuterol (PROVENTIL) (2.5 MG/3ML) 0.083% nebulizer solution Albuterol nebulizer  4 TIMES DAILY 04/29/18  Yes Weber, Damaris Hippo, PA-C  aspirin EC 81 MG tablet Take 81-162 mg by mouth See admin instructions. 81 mg once a day "for heart health" and 162 mg one to two times a day as needed for headaches   Yes [provider]  budesonide-formoterol (SYMBICORT) 160-4.5 MCG/ACT inhaler Inhale 2 puffs into the lungs 2 (two) times daily. 09/19/17  Yes Patrecia Pour, Christean Grief, MD  calcium carbonate (TUMS EX) 750 MG chewable tablet Chew 1 tablet by mouth 2 (two) times daily as needed for heartburn.   Yes [provider]  divalproex (DEPAKOTE ER) 500 MG 24 hr tablet TAKE 1 TABLET BY MOUTH EVERYDAY AT BEDTIME 07/16/18  Yes Weber, Sarah L, PA-C  fluticasone (FLONASE) 50 MCG/ACT nasal spray Place 2 sprays into both nostrils daily. 02/19/18  Yes Weber, Damaris Hippo, PA-C  gabapentin (NEURONTIN) 100 MG capsule 1 po qam, 2 po qhs 07/16/18  Yes Weber, Judson Roch  L, PA-C  meclizine (ANTIVERT) 12.5 MG tablet Take 1 tablet (12.5 mg total) by mouth 2 (two) times daily as needed for dizziness. 09/23/18  Yes Rutherford Guys, MD  nortriptyline (PAMELOR) 50 MG capsule TAKE 2 CAPSULES BY MOUTH NIGHTLY 07/16/18  Yes Weber, Damaris Hippo, PA-C  spironolactone (ALDACTONE) 50 MG tablet TAKE 1 TABLET (50 MG TOTAL) BY MOUTH DAILY. OFFICE VISIT NEEDED FOR REFILLS 09/07/18  Yes Rutherford Guys, MD  tiZANidine (ZANAFLEX) 4 MG tablet Take 1 tablet (4 mg total) by mouth at bedtime. 07/16/18  Yes Weber, Sarah L, PA-C  triamcinolone cream (KENALOG) 0.1 % Apply 1 application topically 2 (two) times daily. 09/11/18  Yes Posey Boyer, MD    Past Medical History:  Diagnosis Date  . Anxiety   . Arthritis    "jooints" (09/18/2017)  . Asthma   . Bipolar disorder ([place])   .  CAP (community acquired pneumonia) 09/18/2017  . Cat allergies   . Chronic bronchitis ([place])    "get it alot; maybe not q yr" (07/06/2014)  . Chronic lower back pain    "spurs and pinched nerves" (09/18/2017)  . COPD (chronic obstructive pulmonary disease) ([place])   . DDD (degenerative disc disease), lumbar   . Depression    psychiatrist Dr. Toy Care  . Diastolic dysfunction   . Emphysematous COPD ([place])    changes in right base along with patchy areas on last x-ray  . Falls frequently    "several in 2017; fell at dr's office yesterday" (09/18/2017)  . GERD (gastroesophageal reflux disease)   . HCAP (healthcare-associated pneumonia) 02/12/2016  . Heart murmur   . History of cardiovascular stress test 08/15/2004   EF of 70% -- Normal stress cardiolite.  There is no evidence of ischemia and there is normal LV function. -- [name]. MD  . History of echocardiogram 12/24/2006   Est. EF of 35-45% NORMAL LV SYSTOLIC FUNCTION WITH IMPAIRED RELAXATION -- MILD AORTIC SCLEROSIS -- NORMAL PALONARY ARTERY PRESSURE -- NO OLD ECHOS FOR COMPARISON -- [name], MD  . Hypertension    essential hypertension  . Migraine    "when I was having my periods; none since then" (09/18/2017)  . Necrotic pneumonia ([place])    recurrent/notes 02/12/2016  . Pneumonia    "several times since May 2015" (07/06/2014)  . Pollen allergies   . Tobacco abuse    smoking about 1/2 pk of cigarettes a day    Past Surgical History:  Procedure Laterality Date  . DILATION AND CURETTAGE OF UTERUS    . TUBAL LIGATION  1986    Social History   Tobacco Use  . Smoking status: Current Every Day Smoker    Packs/day: 0.50    Years: 46.00    Pack years: 23.00    Types: Cigarettes  . Smokeless tobacco: Never Used  Substance Use Topics  . Alcohol use: Yes    Alcohol/week: 0.0 standard drinks    Comment: 09/18/2017 "might have 1 drink/month; if that"    Family History  Problem Relation Age of Onset  . Stroke Mother    . Heart disease Father   . Hyperlipidemia Father   . Hypertension Father   . Breast cancer Maternal Aunt   . Asthma Maternal Grandmother     Review of Systems  Constitutional: Negative for chills and fever.  HENT: Positive for congestion.   Respiratory: Negative for cough and shortness of breath.   Cardiovascular: Negative for chest pain, palpitations and leg swelling.  Gastrointestinal: Negative for abdominal pain, nausea and vomiting.  Musculoskeletal: Positive for back pain.  Neurological: Positive for dizziness.     OBJECTIVE:  Blood pressure 134/74, pulse 81, temperature 97.6 F (36.4 C), temperature source Oral, resp. rate 12, height 5\' 3"  (1.6 m), weight 119 lb 12.8 oz (54.3 kg), SpO2 96 %. Body mass index is 21.22 kg/m.   Physical Exam  Constitutional: She is oriented to person, place, and time. She appears well-developed and well-nourished.  HENT:  Head: Normocephalic and atraumatic.  Right Ear: Hearing, tympanic membrane, external ear and ear canal normal.  Left Ear: Hearing, tympanic membrane, external ear and ear canal normal.  Mouth/Throat: Oropharynx is clear and moist.  Eyes: Pupils are equal, round, and reactive to light. Conjunctivae and EOM are normal.  Neck: Neck supple.  Cardiovascular: Normal rate, regular rhythm and normal heart sounds. Exam reveals no gallop and no friction rub.  No murmur heard. Pulmonary/Chest: Effort normal and breath sounds normal. She has no wheezes. She has no rales.  Musculoskeletal: She exhibits no edema.  Lymphadenopathy:    She has no cervical adenopathy.  Neurological: She is alert and oriented to person, place, and time.  Skin: Skin is warm and dry.  Psychiatric: She has a normal mood and affect.  Nursing note and vitals reviewed.   ASSESSMENT and PLAN  1. Dizziness Much improved. Continue with supportive measures for congestion, etc  2. Chronic bilateral low back pain without sciatica Stable, cont current  regime - tiZANidine (ZANAFLEX) 4 MG tablet; Take 1 tablet (4 mg total) by mouth at bedtime.  3. Essential hypertension Controlled. Continue current regime.  - CBC with Differential/Platelet - Comprehensive metabolic panel - TSH  4. Mood disorder ([place]) 5. Medication monitoring encounter Will be establishing with new psychiatrist tomorrow - Valproic acid level - TSH  Return in about 4 months (around 02/13/2019) for routine followup.    Rutherford Guys, MD Primary Care at [place], [place] [address] Ph.  [phone] Fax [phone]

## 2018-10-15 NOTE — Patient Instructions (Signed)
° ° ° °  If you have lab work done today you will be contacted with your lab results within the next 2 weeks.  If you have not heard from us then please contact us. The fastest way to get your results is to register for My Chart. ° ° °IF you received an x-ray today, you will receive an invoice from [facility]. Please contact [facility] at [phone] with questions or concerns regarding your invoice.  ° °IF you received labwork today, you will receive an invoice from [facility]. Please contact [facility] at [phone] with questions or concerns regarding your invoice.  ° °Our billing staff will not be able to assist you with questions regarding bills from these companies. ° °You will be contacted with the lab results as soon as they are available. The fastest way to get your results is to activate your My Chart account. Instructions are located on the last page of this paperwork. If you have not heard from us regarding the results in 2 weeks, please contact this office. °  ° ° ° °

## 2018-10-16 ENCOUNTER — Other Ambulatory Visit: Payer: Self-pay

## 2018-10-16 ENCOUNTER — Encounter (HOSPITAL_COMMUNITY): Payer: Self-pay | Admitting: Psychiatry

## 2018-10-16 ENCOUNTER — Ambulatory Visit (INDEPENDENT_AMBULATORY_CARE_PROVIDER_SITE_OTHER): Payer: Medicare Other | Admitting: Psychiatry

## 2018-10-16 VITALS — BP 148/96 | Ht 63.0 in | Wt 119.6 lb

## 2018-10-16 DIAGNOSIS — F3130 Bipolar disorder, current episode depressed, mild or moderate severity, unspecified: Secondary | ICD-10-CM | POA: Diagnosis not present

## 2018-10-16 DIAGNOSIS — F418 Other specified anxiety disorders: Secondary | ICD-10-CM | POA: Diagnosis not present

## 2018-10-16 DIAGNOSIS — F411 Generalized anxiety disorder: Secondary | ICD-10-CM | POA: Diagnosis not present

## 2018-10-16 DIAGNOSIS — F17210 Nicotine dependence, cigarettes, uncomplicated: Secondary | ICD-10-CM

## 2018-10-16 DIAGNOSIS — F39 Unspecified mood [affective] disorder: Secondary | ICD-10-CM

## 2018-10-16 LAB — TSH: TSH: 2.07 u[IU]/mL (ref 0.450–4.500)

## 2018-10-16 LAB — CBC WITH DIFFERENTIAL/PLATELET
Basophils Absolute: 0 10*3/uL (ref 0.0–0.2)
Basos: 1 %
EOS (ABSOLUTE): 0.1 10*3/uL (ref 0.0–0.4)
Eos: 2 %
Hematocrit: 48.4 % — ABNORMAL HIGH (ref 34.0–46.6)
Hemoglobin: 16.3 g/dL — ABNORMAL HIGH (ref 11.1–15.9)
Immature Grans (Abs): 0 10*3/uL (ref 0.0–0.1)
Immature Granulocytes: 1 %
Lymphocytes Absolute: 0.8 10*3/uL (ref 0.7–3.1)
Lymphs: 23 %
MCH: 29.3 pg (ref 26.6–33.0)
MCHC: 33.7 g/dL (ref 31.5–35.7)
MCV: 87 fL (ref 79–97)
Monocytes Absolute: 0.2 10*3/uL (ref 0.1–0.9)
Monocytes: 6 %
Neutrophils Absolute: 2.8 10*3/uL (ref 1.4–7.0)
Neutrophils: 67 %
Platelets: 86 10*3/uL — CL (ref 150–450)
RBC: 5.57 x10E6/uL — ABNORMAL HIGH (ref 3.77–5.28)
RDW: 12.9 % (ref 12.3–15.4)
WBC: 4 10*3/uL (ref 3.4–10.8)

## 2018-10-16 LAB — COMPREHENSIVE METABOLIC PANEL
ALT: 19 IU/L (ref 0–32)
AST: 23 IU/L (ref 0–40)
Albumin/Globulin Ratio: 1.1 — ABNORMAL LOW (ref 1.2–2.2)
Albumin: 3.9 g/dL (ref 3.6–4.8)
Alkaline Phosphatase: 144 IU/L — ABNORMAL HIGH (ref 39–117)
BUN/Creatinine Ratio: 12 (ref 12–28)
BUN: 8 mg/dL (ref 8–27)
Bilirubin Total: 0.2 mg/dL (ref 0.0–1.2)
CO2: 26 mmol/L (ref 20–29)
Calcium: 9.1 mg/dL (ref 8.7–10.3)
Chloride: 98 mmol/L (ref 96–106)
Creatinine, Ser: 0.66 mg/dL (ref 0.57–1.00)
GFR calc Af Amer: 105 mL/min/{1.73_m2} (ref >59)
GFR calc non Af Amer: 91 mL/min/{1.73_m2} (ref >59)
Globulin, Total: 3.4 g/dL (ref 1.5–4.5)
Glucose: 83 mg/dL (ref 65–99)
Potassium: 4.1 mmol/L (ref 3.5–5.2)
Sodium: 137 mmol/L (ref 134–144)
Total Protein: 7.3 g/dL (ref 6.0–8.5)

## 2018-10-16 LAB — VALPROIC ACID LEVEL: Valproic Acid Lvl: 53 ug/mL (ref 50–100)

## 2018-10-16 MED ORDER — DIVALPROEX SODIUM ER 500 MG PO TB24: ORAL_TABLET | ORAL | 0 refills | Status: DC

## 2018-10-16 MED ORDER — NORTRIPTYLINE HCL 50 MG PO CAPS: ORAL_CAPSULE | ORAL | 0 refills | Status: DC

## 2018-10-16 MED ORDER — GABAPENTIN 100 MG PO CAPS: ORAL_CAPSULE | ORAL | 0 refills | Status: DC

## 2018-10-16 MED ORDER — VENLAFAXINE HCL 37.5 MG PO TABS: 37.5000 mg | ORAL_TABLET | Freq: Every day | ORAL | 1 refills | Status: DC

## 2018-10-16 NOTE — Progress Notes (Signed)
Psychiatric Initial Adult Assessment   Patient Identification: Catherine Kelley MRN:  [id number] Date of Evaluation:  10/16/2018 Referral Source: Primary care  . Catherine Kelley Chief Complaint:   Chief Complaint    Establish Care; Depression; Anxiety; Medication Refill     Visit Diagnosis:    ICD-10-CM   1. Bipolar I disorder, most recent episode depressed ([place]) F31.30   2. Mood disorder ([place]) F39   3. Cigarette smoker F17.210   4. GAD (generalized anxiety disorder) F41.1   5. Anxiety associated with depression F41.8 divalproex (DEPAKOTE ER) 500 MG 24 hr tablet    gabapentin (NEURONTIN) 100 MG capsule    nortriptyline (PAMELOR) 50 MG capsule    History of Present Illness:  67 years old married white female. Referred for bipolar or mood disorder  have seen Dr. Wylene Simmer before as pcyhiatrist , she retired . Later followed with primary care for meds. Have kept on depakote and pamelor. Xanax was not re started. Last use of xanax was 3 months ago. Michela Pitcher it was difficult not being on it . Later on added gabapentin helped some but endoreses anxiety, depression  Has episodes of depression for days with deccreased energy, decreased interest in things, fatigue, crying spells, decreased motivation. No admission or suciide attempt in past There are days when she gets hyper in the past, racing toughts, wants to finish projects or cleaning and tires up. Gets irrational but not delusional or psychotic  Worries excessive of health, finances  Denies paranoia or hallucinations Doesn't feel depakote helping with depression. Wants to add some med gababpentin has helped some anxiety but feels dose may be small compared to effect by xanax  Husband is suppportive Modifying factor: husband Aggravating F: finances, feels lonely Duration : more then 20 years more so depression started after her mom death    Associated Signs/Symptoms: Depression Symptoms:  depressed mood, fatigue, anxiety, (Hypo) Manic Symptoms:   Distractibility, Labiality of Mood, Anxiety Symptoms:  Excessive Worry, Psychotic Symptoms:  denies PTSD Symptoms: NA  Past Psychiatric History: depression, anxiety  Previous Psychotropic Medications: Yes   Substance Abuse History in the last 12 months:  No.  Consequences of Substance Abuse: NA  Past Medical History:  Past Medical History:  Diagnosis Date  . Anxiety   . Arthritis    "jooints" (09/18/2017)  . Asthma   . Bipolar disorder ([place])   . CAP (community acquired pneumonia) 09/18/2017  . Cat allergies   . Chronic bronchitis ([place])    "get it alot; maybe not q yr" (07/06/2014)  . Chronic lower back pain    "spurs and pinched nerves" (09/18/2017)  . COPD (chronic obstructive pulmonary disease) ([place])   . DDD (degenerative disc disease), lumbar   . Depression    psychiatrist Dr. Toy Care  . Diastolic dysfunction   . Emphysematous COPD ([place])    changes in right base along with patchy areas on last x-ray  . Falls frequently    "several in 2017; fell at dr's office yesterday" (09/18/2017)  . GERD (gastroesophageal reflux disease)   . HCAP (healthcare-associated pneumonia) 02/12/2016  . Heart murmur   . History of cardiovascular stress test 08/15/2004   EF of 70% -- Normal stress cardiolite.  There is no evidence of ischemia and there is normal LV function. -- [name]. MD  . History of echocardiogram 12/24/2006   Est. EF of 29-79% NORMAL LV SYSTOLIC FUNCTION WITH IMPAIRED RELAXATION -- MILD AORTIC SCLEROSIS -- NORMAL PALONARY ARTERY PRESSURE -- NO OLD ECHOS  FOR COMPARISON -- [name], MD  . Hypertension    essential hypertension  . Migraine    "when I was having my periods; none since then" (09/18/2017)  . Necrotic pneumonia ([place])    recurrent/notes 02/12/2016  . Pneumonia    "several times since May 2015" (07/06/2014)  . Pollen allergies   . Tobacco abuse    smoking about 1/2 pk of cigarettes a day    Past Surgical History:  Procedure Laterality Date   . CAROTID STENT    . DILATION AND CURETTAGE OF UTERUS    . TUBAL LIGATION  1986    Family Psychiatric History: denies  Family History:  Family History  Problem Relation Age of Onset  . Stroke Mother   . Heart disease Father   . Hyperlipidemia Father   . Hypertension Father   . Breast cancer Maternal Aunt   . Asthma Maternal Grandmother     Social History:   Social History   Socioeconomic History  . Marital status: Married    Spouse name: richard  . Number of children: 2  . Years of education: Not on file  . Highest education level: Associate degree: occupational, Hotel manager, or vocational program  Occupational History  . Occupation: Chemical engineer: [employer]    Comment: [employer]  Social Needs  . Financial resource strain: Somewhat hard  . Food insecurity:    Worry: Sometimes true    Inability: Sometimes true  . Transportation needs:    Medical: No    Non-medical: No  Tobacco Use  . Smoking status: Current Every Day Smoker    Packs/day: 0.50    Years: 46.00    Pack years: 23.00    Types: Cigarettes  . Smokeless tobacco: Never Used  Substance and Sexual Activity  . Alcohol use: Yes    Alcohol/week: 0.0 standard drinks    Comment: 09/18/2017 "might have 1 drink/month; if that"  . Drug use: No  . Sexual activity: Not Currently  Lifestyle  . Physical activity:    Days per week: 0 days    Minutes per session: 0 min  . Stress: Very much  Relationships  . Social connections:    Talks on phone: Not on file    Gets together: Not on file    Attends religious service: More than 4 times per year    Active member of club or organization: No    Attends meetings of clubs or organizations: Never    Relationship status: Married  Other Topics Concern  . Not on file  Social History Narrative   Patient currently lives with her husband.    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 No bird or hot tub exposure. No mold exposure. No recent travel.    Additional Social History: Grew up with parents. Was good. 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 Have 2 kids   Allergies:   Allergies  Allergen Reactions  . Sulfa Drugs Cross Reactors Hives and Rash    Hives and rash  . Ceclor [Cefaclor] Hives    Tolerated ceftazidime December 2016  . Depakote Er [Divalproex Sodium Er] Other (See Comments)    Patient remarked that when she was taking this TWICE a day, she became "clumsy" and felt more prone to stumbling  . Escitalopram Oxalate Other (See  Comments)    Pt does not recall ever taking medication (??)  . Sertraline Hcl     Severe headache    Metabolic Disorder Labs: Lab Results  Component Value Date   HGBA1C 5.2 07/20/2017   MPG 117 08/16/2015   No results found for: PROLACTIN No results found for: CHOL, TRIG, HDL, CHOLHDL, VLDL, LDLCALC Lab Results  Component Value Date   TSH 2.070 10/15/2018    Therapeutic Level Labs: No results found for: LITHIUM No results found for: CBMZ Lab Results  Component Value Date   VALPROATE 53 10/15/2018    Current Medications: Current Outpatient Medications  Medication Sig Dispense Refill  . albuterol (PROVENTIL) (2.5 MG/3ML) 0.083% nebulizer solution Albuterol nebulizer  4 TIMES DAILY 150 mL 0  . aspirin EC 81 MG tablet Take 81-162 mg by mouth See admin instructions. 81 mg once a day "for heart health" and 162 mg one to two times a day as needed for headaches    . budesonide-formoterol (SYMBICORT) 160-4.5 MCG/ACT inhaler Inhale 2 puffs into the lungs 2 (two) times daily. 1 Inhaler 12  . calcium carbonate (TUMS EX) 750 MG chewable tablet Chew 1 tablet by mouth 2 (two) times daily as needed for heartburn.    . divalproex (DEPAKOTE ER) 500 MG 24 hr tablet TAKE 1 TABLET BY MOUTH EVERYDAY AT BEDTIME 90 tablet 0  . fluticasone (FLONASE) 50 MCG/ACT nasal spray Place 2 sprays into both  nostrils daily. 16 g 6  . gabapentin (NEURONTIN) 100 MG capsule 1 po qam, 2 po qhs 270 capsule 0  . nortriptyline (PAMELOR) 50 MG capsule TAKE 2 CAPSULES BY MOUTH NIGHTLY 180 capsule 0  . spironolactone (ALDACTONE) 50 MG tablet TAKE 1 TABLET (50 MG TOTAL) BY MOUTH DAILY. OFFICE VISIT NEEDED FOR REFILLS 90 tablet 0  . tiZANidine (ZANAFLEX) 4 MG tablet Take 1 tablet (4 mg total) by mouth at bedtime. 90 tablet 1  . triamcinolone cream (KENALOG) 0.1 % Apply 1 application topically 2 (two) times daily. 30 g 1  . venlafaxine (EFFEXOR) 37.5 MG tablet Take 1 tablet (37.5 mg total) by mouth daily. 30 tablet 1   No current facility-administered medications for this visit.     Musculoskeletal: Strength & Muscle Tone: within normal limits Gait & Station: normal Patient leans: no lean  Psychiatric Specialty Exam: Review of Systems  Cardiovascular: Negative for chest pain.  Skin: Negative for rash.  Neurological: Negative for tremors.  Psychiatric/Behavioral: Positive for depression.    Blood pressure (!) 148/96, height 5\' 3"  (1.6 m), weight 119 lb 9.6 oz (54.3 kg).Body mass index is 21.19 kg/m.  General Appearance: Casual  Eye Contact:  Fair  Speech:  Slow  Volume:  Normal  Mood:  Dysphoric  Affect:  Constricted  Thought Process:  Goal Directed  Orientation:  Full (Time, Place, and Person)  Thought Content:  Rumination  Suicidal Thoughts:  No  Homicidal Thoughts:  No  Memory:  Immediate;   Fair Recent;   Fair  Judgement:  Fair  Insight:  Shallow  Psychomotor Activity:  Decreased  Concentration:  Concentration: Fair and Attention Span: Fair  Recall:  [place]: Fair  Akathisia:  No  Handed:  Right  AIMS (if indicated):  not done  Assets:  Desire for Improvement  ADL's:  Intact  Cognition: WNL  Sleep:  Fair   Screenings: PHQ2-9     Office Visit from 10/15/2018 in Primary Care at [facility] from 09/11/2018 in Primary  Care at [facility] from 06/15/2018 in Primary Care at [facility] from 05/21/2018 in Primary Care at [facility] from 03/27/2018 in Primary Care at [facility] Total Score  2  0  0  4  0  PHQ-9 Total Score  9  -  -  7  -      Assessment and Plan: as follows  Bipolar disorder per history, episode depressed: continue depakote. Levels were done yesterday. [place] dizzy on higher dose that's why it is listed allergy but she is ok at night with one tablet  Continue gabapentin as well For depression will add small dose of effexor. Since had headaches with zoloft before GAD; add effexor, continue gabapentin Insomnia: reviewed sleep hygiene continue pamelor says does not want to stop it.  Reviewed meds, questions addressed  More than 50% time spent in counseling and coordination of care including patient education reviewed side effects and concerns were addressed  Patient does not want to come in for therapy we talked about having a worry time and then and also assign a ME time FU 4 w or earlier if needed Merian Capron, MD 11/16/201911:20 AM

## 2018-10-16 NOTE — Addendum Note (Signed)
Addended by: Rutherford Guys on: 10/16/2018 06:15 PM   Modules accepted: Orders

## 2018-10-19 ENCOUNTER — Encounter: Payer: Self-pay | Admitting: Family Medicine

## 2018-10-19 NOTE — Progress Notes (Signed)
Lab letter has been sent via mail to patient home address

## 2018-11-29 ENCOUNTER — Other Ambulatory Visit: Payer: Self-pay | Admitting: Family Medicine

## 2018-11-29 DIAGNOSIS — R600 Localized edema: Secondary | ICD-10-CM

## 2018-11-30 ENCOUNTER — Other Ambulatory Visit: Payer: Self-pay | Admitting: *Deleted

## 2018-11-30 DIAGNOSIS — R600 Localized edema: Secondary | ICD-10-CM

## 2018-11-30 MED ORDER — SPIRONOLACTONE 50 MG PO TABS: 50.0000 mg | ORAL_TABLET | Freq: Every day | ORAL | 0 refills | Status: DC

## 2018-11-30 NOTE — Progress Notes (Signed)
Requested Prescriptions  Pending Prescriptions Disp Refills  . spironolactone (ALDACTONE) 50 MG tablet 90 tablet 0    Sig: Take 1 tablet (50 mg total) by mouth daily. Office visit needed for refills     There is no refill protocol information for this order

## 2018-11-30 NOTE — Telephone Encounter (Signed)
Requested Prescriptions  Pending Prescriptions Disp Refills  . spironolactone (ALDACTONE) 50 MG tablet [Pharmacy Med Name: SPIRONOLACTONE 50 MG TABLET] 90 tablet 0    Sig: TAKE 1 TABLET (50 MG TOTAL) BY MOUTH DAILY. OFFICE VISIT NEEDED FOR REFILLS     Cardiovascular: Diuretics - Aldosterone Antagonist Failed - 11/29/2018  1:41 PM      Failed - Last BP in normal range    BP Readings from Last 1 Encounters:  10/15/18 134/74         Passed - Cr in normal range and within 360 days    Creat  Date Value Ref Range Status  06/27/2016 0.45 (L) 0.50 - 0.99 mg/dL Final    Comment:      For patients > or = 68 years of age: The upper reference limit for Creatinine is approximately 13% higher for people identified as African-American.      Creatinine, Ser  Date Value Ref Range Status  10/15/2018 0.66 0.57 - 1.00 mg/dL Final         Passed - K in normal range and within 360 days    Potassium  Date Value Ref Range Status  10/15/2018 4.1 3.5 - 5.2 mmol/L Final         Passed - Na in normal range and within 360 days    Sodium  Date Value Ref Range Status  10/15/2018 137 134 - 144 mmol/L Final         Passed - Valid encounter within last 6 months    Recent Outpatient Visits          1 month ago Dizziness   [facility], Lilia Argue, MD   2 months ago Hand eczema   [facility] at [facility], Fenton Malling, MD   4 months ago Muscle spasm   Primary Care at [place], PA-C   5 months ago Bilateral lower extremity edema   [facility], Lilia Argue, MD   6 months ago Muscle spasm   [facility], Damaris Hippo, PA-C      Future Appointments            In 2 months Rutherford Guys, MD Primary Care at [place], [facility]

## 2018-11-30 NOTE — Addendum Note (Signed)
Addended by: Addison Naegeli on: 11/30/2018 02:51 PM   Modules accepted: Orders

## 2018-11-30 NOTE — Addendum Note (Signed)
Addended by: Addison Naegeli on: 11/30/2018 08:57 AM   Modules accepted: Orders

## 2018-11-30 NOTE — Telephone Encounter (Signed)
Patient is calling back as the pharmacy did not receive this.

## 2018-12-06 ENCOUNTER — Other Ambulatory Visit (HOSPITAL_COMMUNITY): Payer: Self-pay | Admitting: Psychiatry

## 2018-12-06 ENCOUNTER — Other Ambulatory Visit (HOSPITAL_COMMUNITY): Payer: Self-pay

## 2018-12-06 MED ORDER — VENLAFAXINE HCL 37.5 MG PO TABS: 37.5000 mg | ORAL_TABLET | Freq: Every day | ORAL | 0 refills | Status: DC

## 2018-12-10 ENCOUNTER — Ambulatory Visit: Payer: Medicare Other | Admitting: Cardiovascular Disease

## 2018-12-17 ENCOUNTER — Encounter (HOSPITAL_COMMUNITY): Payer: Self-pay | Admitting: Psychiatry

## 2018-12-17 ENCOUNTER — Ambulatory Visit (INDEPENDENT_AMBULATORY_CARE_PROVIDER_SITE_OTHER): Payer: Medicare Other | Admitting: Psychiatry

## 2018-12-17 VITALS — BP 146/84 | HR 94 | Ht <= 58 in | Wt 121.0 lb

## 2018-12-17 DIAGNOSIS — F411 Generalized anxiety disorder: Secondary | ICD-10-CM

## 2018-12-17 DIAGNOSIS — F319 Bipolar disorder, unspecified: Secondary | ICD-10-CM

## 2018-12-17 DIAGNOSIS — F418 Other specified anxiety disorders: Secondary | ICD-10-CM | POA: Diagnosis not present

## 2018-12-17 MED ORDER — GABAPENTIN 100 MG PO CAPS: ORAL_CAPSULE | ORAL | 0 refills | Status: DC

## 2018-12-17 MED ORDER — DIVALPROEX SODIUM ER 500 MG PO TB24: ORAL_TABLET | ORAL | 0 refills | Status: DC

## 2018-12-17 MED ORDER — VENLAFAXINE HCL 37.5 MG PO TABS: 37.5000 mg | ORAL_TABLET | Freq: Every day | ORAL | 0 refills | Status: DC

## 2018-12-17 MED ORDER — NORTRIPTYLINE HCL 50 MG PO CAPS: ORAL_CAPSULE | ORAL | 0 refills | Status: DC

## 2018-12-17 NOTE — Progress Notes (Signed)
[facility] MD/PA/NP OP Progress Note  12/17/2018 11:24 AM Catherine Kelley  MRN:  [id number]  Chief Complaint: I like new medication.  Helping my anxiety.  HPI: Catherine Kelley is 68 year old Caucasian, married female who was seen by Dr. Paticia Stack on October 16, 2018.  This is her second appointment.  She had a history of depression, anxiety and bipolar disorder.  She was seeing [name] who no longer accept her insurance.  For the brief time she was given medication from her primary care physician and recommended to see psychiatrist.  Patient was taking Xanax by [name] but it was not renewed and she has been not taking the Xanax for past 6 months.  She was given Effexor which she feels helps her anxiety.  She feels her energy level is improved and she has no longer crying spells.  She admitted history of highs and lows with racing thoughts but denies any psychosis or any hallucinations.  She denies any suicidal thoughts or homicidal thought.  She worries about her health, finances.  She lives with her husband who is very supportive.  She has 2 children and 4 grandkids.  Her Christmas was very good.  She is currently taking Pamelor, gabapentin, Effexor and Depakote.  She has no tremors shakes or any EPS.  Her sleep is much better.  She is not interested in therapy.  She denies drinking or using any illegal substances.  Visit Diagnosis:    ICD-10-CM   1. Bipolar I disorder (HCC) F31.9 gabapentin (NEURONTIN) 100 MG capsule  2. GAD (generalized anxiety disorder) F41.1 venlafaxine (EFFEXOR) 37.5 MG tablet    gabapentin (NEURONTIN) 100 MG capsule  3. Anxiety associated with depression F41.8 nortriptyline (PAMELOR) 50 MG capsule    divalproex (DEPAKOTE ER) 500 MG 24 hr tablet    Past Psychiatric History: Reviewed. History of depression and anxiety and bipolar disorder for more than 20 years.  Had tried Paxil, Prozac and Zoloft that cause headaches.  Took Xanax for many years however it was not renewed by primary care physician  when [name] office did not accept her insurance.  Did not recall any history of suicidal attempt or psychiatric inpatient treatment.  Past Medical History:  Past Medical History:  Diagnosis Date  . Anxiety   . Arthritis    "jooints" (09/18/2017)  . Asthma   . Bipolar disorder ([place])   . CAP (community acquired pneumonia) 09/18/2017  . Cat allergies   . Chronic bronchitis ([place])    "get it alot; maybe not q yr" (07/06/2014)  . Chronic lower back pain    "spurs and pinched nerves" (09/18/2017)  . COPD (chronic obstructive pulmonary disease) ([place])   . DDD (degenerative disc disease), lumbar   . Depression    psychiatrist Dr. Toy Care  . Diastolic dysfunction   . Emphysematous COPD ([place])    changes in right base along with patchy areas on last x-ray  . Falls frequently    "several in 2017; fell at dr's office yesterday" (09/18/2017)  . GERD (gastroesophageal reflux disease)   . HCAP (healthcare-associated pneumonia) 02/12/2016  . Heart murmur   . History of cardiovascular stress test 08/15/2004   EF of 70% -- Normal stress cardiolite.  There is no evidence of ischemia and there is normal LV function. -- [name]. MD  . History of echocardiogram 12/24/2006   Est. EF of 93-71% NORMAL LV SYSTOLIC FUNCTION WITH IMPAIRED RELAXATION -- MILD AORTIC SCLEROSIS -- NORMAL PALONARY ARTERY PRESSURE -- NO OLD ECHOS  FOR COMPARISON -- [name], MD  . Hypertension    essential hypertension  . Migraine    "when I was having my periods; none since then" (09/18/2017)  . Necrotic pneumonia ([place])    recurrent/notes 02/12/2016  . Pneumonia    "several times since May 2015" (07/06/2014)  . Pollen allergies   . Tobacco abuse    smoking about 1/2 pk of cigarettes a day    Past Surgical History:  Procedure Laterality Date  . CAROTID STENT    . DILATION AND CURETTAGE OF UTERUS    . TUBAL LIGATION  1986    Family Psychiatric History: Reviewed.  Family History:  Family History  Problem  Relation Age of Onset  . Stroke Mother   . Heart disease Father   . Hyperlipidemia Father   . Hypertension Father   . Breast cancer Maternal Aunt   . Asthma Maternal Grandmother     Social History:  Social History   Socioeconomic History  . Marital status: Married    Spouse name: richard  . Number of children: 2  . Years of education: Not on file  . Highest education level: Associate degree: occupational, Hotel manager, or vocational program  Occupational History  . Occupation: Chemical engineer: [employer]    Comment: [employer]  Social Needs  . Financial resource strain: Somewhat hard  . Food insecurity:    Worry: Sometimes true    Inability: Sometimes true  . Transportation needs:    Medical: No    Non-medical: No  Tobacco Use  . Smoking status: Current Every Day Smoker    Packs/day: 0.50    Years: 46.00    Pack years: 23.00    Types: Cigarettes  . Smokeless tobacco: Never Used  Substance and Sexual Activity  . Alcohol use: Yes    Alcohol/week: 0.0 standard drinks    Comment: 09/18/2017 "might have 1 drink/month; if that"  . Drug use: No  . Sexual activity: Not Currently  Lifestyle  . Physical activity:    Days per week: 0 days    Minutes per session: 0 min  . Stress: Very much  Relationships  . Social connections:    Talks on phone: Not on file    Gets together: Not on file    Attends religious service: More than 4 times per year    Active member of club or organization: No    Attends meetings of clubs or organizations: Never    Relationship status: Married  Other Topics Concern  . Not on file  Social History Narrative   Patient currently lives with her husband.    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 No bird or hot tub exposure. No mold exposure. No recent travel.    Allergies:  Allergies  Allergen Reactions  . Sulfa Drugs Cross Reactors Hives and Rash     Hives and rash  . Ceclor [Cefaclor] Hives    Tolerated ceftazidime December 2016  . Depakote Er [Divalproex Sodium Er] Other (See Comments)    Patient remarked that when she was taking this TWICE a day, she became "clumsy" and felt more prone to stumbling  . Escitalopram Oxalate Other (See Comments)    Pt does not recall ever taking medication (??)  . Sertraline Hcl     Severe headache    Metabolic Disorder Labs: Recent  Results (from the past 2160 hour(s))  CBC with Differential/Platelet     Status: Abnormal   Collection Time: 10/15/18  5:05 PM  Result Value Ref Range   WBC 4.0 3.4 - 10.8 x10E3/uL   RBC 5.57 (H) 3.77 - 5.28 x10E6/uL   Hemoglobin 16.3 (H) 11.1 - 15.9 g/dL   Hematocrit 48.4 (H) 34.0 - 46.6 %   MCV 87 79 - 97 fL   MCH 29.3 26.6 - 33.0 pg   MCHC 33.7 31.5 - 35.7 g/dL   RDW 12.9 12.3 - 15.4 %   Platelets 86 (LL) 150 - 450 x10E3/uL    Comment: Actual platelet count may be somewhat higher than reported due to aggregation of platelets in this sample.    Neutrophils 67 Not Estab. %   Lymphs 23 Not Estab. %   Monocytes 6 Not Estab. %   Eos 2 Not Estab. %   Basos 1 Not Estab. %   Neutrophils Absolute 2.8 1.4 - 7.0 x10E3/uL   Lymphocytes Absolute 0.8 0.7 - 3.1 x10E3/uL   Monocytes Absolute 0.2 0.1 - 0.9 x10E3/uL   EOS (ABSOLUTE) 0.1 0.0 - 0.4 x10E3/uL   Basophils Absolute 0.0 0.0 - 0.2 x10E3/uL   Immature Granulocytes 1 Not Estab. %   Immature Grans (Abs) 0.0 0.0 - 0.1 x10E3/uL   Hematology Comments: Note:     Comment: Verified by microscopic examination.  Comprehensive metabolic panel     Status: Abnormal   Collection Time: 10/15/18  5:05 PM  Result Value Ref Range   Glucose 83 65 - 99 mg/dL   BUN 8 8 - 27 mg/dL   Creatinine, Ser 0.66 0.57 - 1.00 mg/dL   GFR calc non Af Amer 91 >59 mL/min/1.73   GFR calc Af Amer 105 >59 mL/min/1.73   BUN/Creatinine Ratio 12 12 - 28   Sodium 137 134 - 144 mmol/L   Potassium 4.1 3.5 - 5.2 mmol/L   Chloride 98 96 - 106  mmol/L   CO2 26 20 - 29 mmol/L   Calcium 9.1 8.7 - 10.3 mg/dL   Total Protein 7.3 6.0 - 8.5 g/dL   Albumin 3.9 3.6 - 4.8 g/dL   Globulin, Total 3.4 1.5 - 4.5 g/dL   Albumin/Globulin Ratio 1.1 (L) 1.2 - 2.2   Bilirubin Total 0.2 0.0 - 1.2 mg/dL   Alkaline Phosphatase 144 (H) 39 - 117 IU/L   AST 23 0 - 40 IU/L   ALT 19 0 - 32 IU/L  Valproic acid level     Status: None   Collection Time: 10/15/18  5:05 PM  Result Value Ref Range   Valproic Acid Lvl 53 50 - 100 ug/mL    Comment:                                 Detection Limit = 4                            <4 indicates None Detected Toxicity may occur at levels of 100-500. Measurements of free unbound valproic acid may improve the assess- ment of clinical response.   TSH     Status: None   Collection Time: 10/15/18  5:05 PM  Result Value Ref Range   TSH 2.070 0.450 - 4.500 uIU/mL   Lab Results  Component Value Date   HGBA1C 5.2 07/20/2017   MPG 117 08/16/2015  No results found for: PROLACTIN No results found for: CHOL, TRIG, HDL, CHOLHDL, VLDL, LDLCALC Lab Results  Component Value Date   TSH 2.070 10/15/2018   TSH 1.39 05/17/2016    Therapeutic Level Labs: No results found for: LITHIUM Lab Results  Component Value Date   VALPROATE 53 10/15/2018   VALPROATE 45 (L) 02/19/2018   No components found for:  CBMZ  Current Medications: Current Outpatient Medications  Medication Sig Dispense Refill  . albuterol (PROVENTIL) (2.5 MG/3ML) 0.083% nebulizer solution Albuterol nebulizer  4 TIMES DAILY 150 mL 0  . aspirin EC 81 MG tablet Take 81-162 mg by mouth See admin instructions. 81 mg once a day "for heart health" and 162 mg one to two times a day as needed for headaches    . budesonide-formoterol (SYMBICORT) 160-4.5 MCG/ACT inhaler Inhale 2 puffs into the lungs 2 (two) times daily. 1 Inhaler 12  . calcium carbonate (TUMS EX) 750 MG chewable tablet Chew 1 tablet by mouth 2 (two) times daily as needed for heartburn.    .  divalproex (DEPAKOTE ER) 500 MG 24 hr tablet TAKE 1 TABLET BY MOUTH EVERYDAY AT BEDTIME 90 tablet 0  . fluticasone (FLONASE) 50 MCG/ACT nasal spray Place 2 sprays into both nostrils daily. 16 g 6  . gabapentin (NEURONTIN) 100 MG capsule 1 po qam, 2 po qhs 270 capsule 0  . nortriptyline (PAMELOR) 50 MG capsule TAKE 2 CAPSULES BY MOUTH NIGHTLY 180 capsule 0  . spironolactone (ALDACTONE) 50 MG tablet Take 1 tablet (50 mg total) by mouth daily. Office visit needed for refills 90 tablet 0  . tiZANidine (ZANAFLEX) 4 MG tablet Take 1 tablet (4 mg total) by mouth at bedtime. 90 tablet 1  . triamcinolone cream (KENALOG) 0.1 % Apply 1 application topically 2 (two) times daily. 30 g 1  . venlafaxine (EFFEXOR) 37.5 MG tablet Take 1 tablet (37.5 mg total) by mouth daily. 30 tablet 0   No current facility-administered medications for this visit.      Musculoskeletal: Strength & Muscle Tone: within normal limits Gait & Station: normal Patient leans: N/A  Psychiatric Specialty Exam: Review of Systems  Constitutional: Negative.   HENT: Negative.   Musculoskeletal: Positive for back pain and joint pain.  Skin: Negative.   Psychiatric/Behavioral: The patient is nervous/anxious.     Blood pressure (!) 146/84, pulse 94, height 1' (0.305 m), weight 121 lb (54.9 kg), SpO2 94 %.Body mass index is 590.78 kg/m.  General Appearance: Casual  Eye Contact:  Fair  Speech:  Clear and Coherent and fast  Volume:  Normal  Mood:  Anxious  Affect:  Appropriate  Thought Process:  Descriptions of Associations: Intact  Orientation:  Full (Time, Place, and Person)  Thought Content: Rumination   Suicidal Thoughts:  No  Homicidal Thoughts:  No  Memory:  Immediate;   Good Recent;   Fair Remote;   Fair  Judgement:  Good  Insight:  Good  Psychomotor Activity:  Increased  Concentration:  Concentration: Fair and Attention Span: Fair  Recall:  Good  Fund of Knowledge: Good  Language: Good  Akathisia:  No  Handed:   Right  AIMS (if indicated): not done  Assets:  Communication Skills Desire for [place] Talents/Skills  ADL's:  Intact  Cognition: WNL  Sleep:  improved   Screenings: PHQ2-9     Office Visit from 10/15/2018 in Primary Care at [place] from 09/11/2018 in Primary Care at [place] from 06/15/2018 in  Primary Care at [place] from 05/21/2018 in Primary Care at [place] from 03/27/2018 in Primary Care at [facility] Total Score  2  0  0  4  0  PHQ-9 Total Score  9  -  -  7  -       Assessment and Plan: Dorea is a 68 year old female with a history of bipolar disorder anxiety and depression.  She established her care in this office.  She was given Effexor with good response.  She still have highs and lows and anxiety and she gets easily distracted.  Recommended to try gabapentin 1 mg in the morning 1 at noon and 2 at bedtime.  Continue Effexor low-dose 37.5 mg daily, Depakote 50 mg at bedtime and nortriptyline 100 mg at bedtime.  She has no tremors, shakes or any EPS.  Discussed sleep hygiene and healthy lifestyle.  Patient is not interested in therapy.  I also reviewed previous records, recent blood work results and that information from other providers.  Recommended to call us back if she is any question or any concern.  Follow-up in 2 months. Time spent 30 minutes.  More than 50% of the time spent in psychoeducation, counseling and coordination of care.  Discuss safety plan that anytime having active suicidal thoughts or homicidal thoughts then patient need to call 911 or go to the local emergency room.     Kathlee Nations, MD 12/17/2018, 11:24 AM

## 2018-12-22 ENCOUNTER — Ambulatory Visit (INDEPENDENT_AMBULATORY_CARE_PROVIDER_SITE_OTHER): Payer: Medicare Other

## 2018-12-22 ENCOUNTER — Other Ambulatory Visit: Payer: Self-pay

## 2018-12-22 ENCOUNTER — Encounter: Payer: Self-pay | Admitting: Emergency Medicine

## 2018-12-22 ENCOUNTER — Ambulatory Visit: Payer: Medicare Other | Admitting: Emergency Medicine

## 2018-12-22 VITALS — BP 147/81 | HR 94 | Temp 98.4°F | Resp 16 | Ht 62.0 in | Wt 121.0 lb

## 2018-12-22 DIAGNOSIS — R05 Cough: Secondary | ICD-10-CM

## 2018-12-22 DIAGNOSIS — J449 Chronic obstructive pulmonary disease, unspecified: Secondary | ICD-10-CM | POA: Diagnosis not present

## 2018-12-22 DIAGNOSIS — J22 Unspecified acute lower respiratory infection: Secondary | ICD-10-CM | POA: Diagnosis not present

## 2018-12-22 DIAGNOSIS — R059 Cough, unspecified: Secondary | ICD-10-CM

## 2018-12-22 MED ORDER — DOXYCYCLINE HYCLATE 100 MG PO TABS: 100.0000 mg | ORAL_TABLET | Freq: Two times a day (BID) | ORAL | 0 refills | Status: AC

## 2018-12-22 NOTE — Progress Notes (Signed)
Catherine Kelley 68 y.o.   Chief Complaint  Patient presents with  . Cough    x 2 weeks PRODUCTIVE - yellow and tan mucus  . Nasal Congestion    HISTORY OF PRESENT ILLNESS: This is a 68 y.o. female complaining of productive cough that started 2 weeks ago and is gradually getting worse.  Smoker.  Husband was also sick and took amoxicillin and is now doing better.  Denies fever or chills.  Denies difficulty breathing.  Denies chest pain.  HPI   Prior to Admission medications   Medication Sig Start Date End Date Taking? Authorizing Provider  albuterol (PROVENTIL) (2.5 MG/3ML) 0.083% nebulizer solution Albuterol nebulizer  4 TIMES DAILY 04/29/18  Yes Weber, Damaris Hippo, PA-C  aspirin EC 81 MG tablet Take 81-162 mg by mouth See admin instructions. 81 mg once a day "for heart health" and 162 mg one to two times a day as needed for headaches   Yes [provider]  budesonide-formoterol (SYMBICORT) 160-4.5 MCG/ACT inhaler Inhale 2 puffs into the lungs 2 (two) times daily. 09/19/17  Yes Patrecia Pour, Christean Grief, MD  calcium carbonate (TUMS EX) 750 MG chewable tablet Chew 1 tablet by mouth 2 (two) times daily as needed for heartburn.   Yes [provider]  divalproex (DEPAKOTE ER) 500 MG 24 hr tablet TAKE 1 TABLET BY MOUTH EVERYDAY AT BEDTIME 12/17/18  Yes Arfeen, Arlyce Harman, MD  fluticasone (FLONASE) 50 MCG/ACT nasal spray Place 2 sprays into both nostrils daily. 02/19/18  Yes Weber, Sarah L, PA-C  gabapentin (NEURONTIN) 100 MG capsule 1 po qam, 1 at noon and 2 po qhs 12/17/18  Yes Arfeen, Arlyce Harman, MD  nortriptyline (PAMELOR) 50 MG capsule TAKE 2 CAPSULES BY MOUTH NIGHTLY 12/17/18  Yes Arfeen, Arlyce Harman, MD  spironolactone (ALDACTONE) 50 MG tablet Take 1 tablet (50 mg total) by mouth daily. Office visit needed for refills 11/30/18  Yes Rutherford Guys, MD  tiZANidine (ZANAFLEX) 4 MG tablet Take 1 tablet (4 mg total) by mouth at bedtime. 10/15/18  Yes Rutherford Guys, MD  triamcinolone cream (KENALOG)  0.1 % Apply 1 application topically 2 (two) times daily. 09/11/18  Yes Posey Boyer, MD  venlafaxine (EFFEXOR) 37.5 MG tablet Take 1 tablet (37.5 mg total) by mouth daily. 12/17/18 12/17/19 Yes Arfeen, Arlyce Harman, MD    Allergies  Allergen Reactions  . Sulfa Drugs Cross Reactors Hives and Rash    Hives and rash  . Ceclor [Cefaclor] Hives    Tolerated ceftazidime December 2016  . Depakote Er [Divalproex Sodium Er] Other (See Comments)    Patient remarked that when she was taking this TWICE a day, she became "clumsy" and felt more prone to stumbling  . Escitalopram Oxalate Other (See Comments)    Pt does not recall ever taking medication (??)  . Sertraline Hcl     Severe headache    Patient Active Problem List   Diagnosis Date Noted  . Senile ecchymosis 09/11/2018  . CAP (community acquired pneumonia) 09/17/2017  . Near syncope 09/17/2017  . Tobacco abuse 02/12/2016  . Other fatigue 02/04/2016  . Respiratory failure with hypoxia ([place]) 01/09/2016  . Malnutrition of moderate degree 11/03/2015  . Pulmonary emphysema ([place])   . Essential hypertension 09/27/2015  . Rhabdomyolysis 08/16/2015  . Chronic cough 06/07/2015  . Bronchiectasis with acute exacerbation ([place]) 03/07/2015  . Cigarette smoker 12/13/2014  . COPD GOLD II/ still smoking 10/11/2014  . Lumbar back pain with radiculopathy affecting right lower  extremity 09/28/2014  . Hyponatremia 07/09/2014  . Hx of recurrent pneumonia 07/08/2014  . Venous insufficiency (chronic) (peripheral) 09/29/2012  . Lower extremity edema 03/09/2012  . Benign hypertensive heart disease without heart failure 05/09/2011  . Chest pain 05/09/2011  . Dyslipidemia 05/09/2011  . Mood disorder ([place]) 05/09/2011  . Osteoarthritis 05/09/2011    Past Medical History:  Diagnosis Date  . Anxiety   . Arthritis    "jooints" (09/18/2017)  . Asthma   . Bipolar disorder ([place])   . CAP (community acquired pneumonia) 09/18/2017  . Cat allergies   . Chronic  bronchitis ([place])    "get it alot; maybe not q yr" (07/06/2014)  . Chronic lower back pain    "spurs and pinched nerves" (09/18/2017)  . COPD (chronic obstructive pulmonary disease) ([place])   . DDD (degenerative disc disease), lumbar   . Depression    psychiatrist Dr. Toy Care  . Diastolic dysfunction   . Emphysematous COPD ([place])    changes in right base along with patchy areas on last x-ray  . Falls frequently    "several in 2017; fell at dr's office yesterday" (09/18/2017)  . GERD (gastroesophageal reflux disease)   . HCAP (healthcare-associated pneumonia) 02/12/2016  . Heart murmur   . History of cardiovascular stress test 08/15/2004   EF of 70% -- Normal stress cardiolite.  There is no evidence of ischemia and there is normal LV function. -- [name]. MD  . History of echocardiogram 12/24/2006   Est. EF of 34-74% NORMAL LV SYSTOLIC FUNCTION WITH IMPAIRED RELAXATION -- MILD AORTIC SCLEROSIS -- NORMAL PALONARY ARTERY PRESSURE -- NO OLD ECHOS FOR COMPARISON -- [name], MD  . Hypertension    essential hypertension  . Migraine    "when I was having my periods; none since then" (09/18/2017)  . Necrotic pneumonia ([place])    recurrent/notes 02/12/2016  . Pneumonia    "several times since May 2015" (07/06/2014)  . Pollen allergies   . Tobacco abuse    smoking about 1/2 pk of cigarettes a day    Past Surgical History:  Procedure Laterality Date  . CAROTID STENT    . DILATION AND CURETTAGE OF UTERUS    . TUBAL LIGATION  1986    Social History   Socioeconomic History  . Marital status: Married    Spouse name: richard  . Number of children: 2  . Years of education: Not on file  . Highest education level: Associate degree: occupational, Hotel manager, or vocational program  Occupational History  . Occupation: Chemical engineer: [employer]    Comment: [employer]  Social Needs  . Financial resource strain: Somewhat hard  . Food insecurity:    Worry: Sometimes true     Inability: Sometimes true  . Transportation needs:    Medical: No    Non-medical: No  Tobacco Use  . Smoking status: Current Every Day Smoker    Packs/day: 0.50    Years: 46.00    Pack years: 23.00    Types: Cigarettes  . Smokeless tobacco: Never Used  Substance and Sexual Activity  . Alcohol use: Yes    Alcohol/week: 0.0 standard drinks    Comment: 09/18/2017 "might have 1 drink/month; if that"  . Drug use: No  . Sexual activity: Not Currently  Lifestyle  . Physical activity:    Days per week: 0 days    Minutes per session: 0 min  . Stress: Very much  Relationships  . Social connections:  Talks on phone: Not on file    Gets together: Not on file    Attends religious service: More than 4 times per year    Active member of club or organization: No    Attends meetings of clubs or organizations: Never    Relationship status: Married  . Intimate partner violence:    Fear of current or ex partner: No    Emotionally abused: No    Physically abused: No    Forced sexual activity: No  Other Topics Concern  . Not on file  Social History Narrative   Patient currently lives with her husband.    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 No bird or hot tub exposure. No mold exposure. No recent travel.    Family History  Problem Relation Age of Onset  . Stroke Mother   . Heart disease Father   . Hyperlipidemia Father   . Hypertension Father   . Breast cancer Maternal Aunt   . Asthma Maternal Grandmother      Review of Systems  Constitutional: Negative.  Negative for chills and fever.  HENT: Positive for congestion. Negative for ear pain and sore throat.   Eyes: Negative.   Respiratory: Positive for cough and sputum production. Negative for hemoptysis, shortness of breath and wheezing.   Cardiovascular: Negative for chest pain and palpitations.  Gastrointestinal: Negative.   Negative for abdominal pain, nausea and vomiting.  Genitourinary: Negative.  Negative for dysuria and urgency.  Musculoskeletal: Negative.  Negative for back pain, myalgias and neck pain.  Skin: Negative.  Negative for rash.  Neurological: Negative.  Negative for dizziness and headaches.  Endo/Heme/Allergies: Negative.   All other systems reviewed and are negative.   Vitals:   12/22/18 1021  BP: (!) 147/81  Pulse: 94  Resp: 16  Temp: 98.4 F (36.9 C)  SpO2: 96%    Physical Exam Vitals signs reviewed.  Constitutional:      Appearance: Normal appearance.  HENT:     Head: Normocephalic and atraumatic.     Nose: Nose normal.     Mouth/Throat:     Mouth: Mucous membranes are moist.     Pharynx: Oropharynx is clear.  Eyes:     Extraocular Movements: Extraocular movements intact.     Conjunctiva/sclera: Conjunctivae normal.     Pupils: Pupils are equal, round, and reactive to light.  Neck:     Musculoskeletal: Normal range of motion and neck supple.  Cardiovascular:     Rate and Rhythm: Normal rate and regular rhythm.     Heart sounds: Normal heart sounds.  Pulmonary:     Effort: Pulmonary effort is normal.     Breath sounds: Rales (Right lower third posteriorly) present. No wheezing.  Musculoskeletal: Normal range of motion.  Skin:    General: Skin is warm and dry.     Capillary Refill: Capillary refill takes less than 2 seconds.  Neurological:     General: No focal deficit present.     Mental Status: She is alert and oriented to person, place, and time.  Psychiatric:        Mood and Affect: Mood normal.        Behavior: Behavior normal.     Dg Chest 2 View  Result Date: 12/22/2018 CLINICAL DATA:  Cough EXAM: CHEST - 2 VIEW COMPARISON:  09/17/2017 FINDINGS: Cardiac shadow  is within normal limits. The lungs are hyperinflated with patchy stable changes in the right lung base likely related to scarring. Previously seen right upper lobe infiltrate has resolved. No new  focal abnormality is seen. IMPRESSION: Chronic fibrotic changes primarily in the right lower lobe. No acute superimposed abnormality is noted. Electronically Signed   By: Inez Catalina M.D.   On: 12/22/2018 10:55    ASSESSMENT & PLAN: Darlys was seen today for cough and nasal congestion.  Diagnoses and all orders for this visit:  Cough -     DG Chest 2 View; Future  Lower respiratory infection  Stage 2 moderate COPD by GOLD classification ([place])  Other orders -     doxycycline (VIBRA-TABS) 100 MG tablet; Take 1 tablet (100 mg total) by mouth 2 (two) times daily for 7 days.    Patient Instructions       If you have lab work done today you will be contacted with your lab results within the next 2 weeks.  If you have not heard from [name] then please contact us. The fastest way to get your results is to register for My Chart.   IF you received an x-ray today, you will receive an invoice from [facility]. Please contact [facility] Rad[facility] at [phone] with questions or concerns regarding your invoice.   IF you received labwork today, you will receive an invoice from [place]. Please contact [facility] at [phone] with questions or concerns regarding your invoice.   Our billing staff will not be able to assist you with questions regarding bills from these companies.  You will be contacted with the lab results as soon as they are available. The fastest way to get your results is to activate your My Chart account. Instructions are located on the last page of this paperwork. If you have not heard from [name] regarding the results in 2 weeks, please contact this office.     Acute Bronchitis, Adult Acute bronchitis is when air tubes (bronchi) in the lungs suddenly get swollen. The condition can make it hard to breathe. It can also cause these symptoms:  A cough.  Coughing up clear, yellow, or green mucus.  Wheezing.  Chest congestion.  Shortness of breath.  A  fever.  Body aches.  Chills.  A sore throat. Follow these instructions at home:  Medicines  Take over-the-counter and prescription medicines only as told by your doctor.  If you were prescribed an antibiotic medicine, take it as told by your doctor. Do not stop taking the antibiotic even if you start to feel better. General instructions  Rest.  Drink enough fluids to keep your pee (urine) pale yellow.  Avoid smoking and secondhand smoke. If you smoke and you need help quitting, ask your doctor. Quitting will help your lungs heal faster.  Use an inhaler, cool mist vaporizer, or humidifier as told by your doctor.  Keep all follow-up visits as told by your doctor. This is important. How is this prevented? To lower your risk of getting this condition again:  Wash your hands often with soap and water. If you cannot use soap and water, use hand sanitizer.  Avoid contact with people who have cold symptoms.  Try not to touch your hands to your mouth, nose, or eyes.  Make sure to get the flu shot every year. Contact a doctor if:  Your symptoms do not get better in 2 weeks. Get help right away if:  You cough up blood.  You have chest  pain.  You have very bad shortness of breath.  You become dehydrated.  You faint (pass out) or keep feeling like you are going to pass out.  You keep throwing up (vomiting).  You have a very bad headache.  Your fever or chills gets worse. This information is not intended to replace advice given to you by your health care provider. Make sure you discuss any questions you have with your health care provider. Document Released: 05/05/2008 Document Revised: 07/01/2017 Document Reviewed: 05/07/2016 Elsevier Interactive Patient Education  2019 Elsevier Inc.      Agustina Caroli, MD [facility]

## 2018-12-22 NOTE — Patient Instructions (Addendum)
     If you have lab work done today you will be contacted with your lab results within the next 2 weeks.  If you have not heard from [name] then please contact us. The fastest way to get your results is to register for My Chart.   IF you received an x-ray today, you will receive an invoice from [facility]. Please contact [facility] at [phone] with questions or concerns regarding your invoice.   IF you received labwork today, you will receive an invoice from [place]. Please contact [facility] at [phone] with questions or concerns regarding your invoice.   Our billing staff will not be able to assist you with questions regarding bills from these companies.  You will be contacted with the lab results as soon as they are available. The fastest way to get your results is to activate your My Chart account. Instructions are located on the last page of this paperwork. If you have not heard from [name] regarding the results in 2 weeks, please contact this office.     Acute Bronchitis, Adult Acute bronchitis is when air tubes (bronchi) in the lungs suddenly get swollen. The condition can make it hard to breathe. It can also cause these symptoms:  A cough.  Coughing up clear, yellow, or green mucus.  Wheezing.  Chest congestion.  Shortness of breath.  A fever.  Body aches.  Chills.  A sore throat. Follow these instructions at home:  Medicines  Take over-the-counter and prescription medicines only as told by your doctor.  If you were prescribed an antibiotic medicine, take it as told by your doctor. Do not stop taking the antibiotic even if you start to feel better. General instructions  Rest.  Drink enough fluids to keep your pee (urine) pale yellow.  Avoid smoking and secondhand smoke. If you smoke and you need help quitting, ask your doctor. Quitting will help your lungs heal faster.  Use an inhaler, cool mist vaporizer, or humidifier as told by your  doctor.  Keep all follow-up visits as told by your doctor. This is important. How is this prevented? To lower your risk of getting this condition again:  Wash your hands often with soap and water. If you cannot use soap and water, use hand sanitizer.  Avoid contact with people who have cold symptoms.  Try not to touch your hands to your mouth, nose, or eyes.  Make sure to get the flu shot every year. Contact a doctor if:  Your symptoms do not get better in 2 weeks. Get help right away if:  You cough up blood.  You have chest pain.  You have very bad shortness of breath.  You become dehydrated.  You faint (pass out) or keep feeling like you are going to pass out.  You keep throwing up (vomiting).  You have a very bad headache.  Your fever or chills gets worse. This information is not intended to replace advice given to you by your health care provider. Make sure you discuss any questions you have with your health care provider. Document Released: 05/05/2008 Document Revised: 07/01/2017 Document Reviewed: 05/07/2016 Elsevier Interactive Patient Education  2019 [employer].

## 2018-12-24 ENCOUNTER — Ambulatory Visit: Payer: Self-pay | Admitting: Family Medicine

## 2018-12-31 ENCOUNTER — Ambulatory Visit (INDEPENDENT_AMBULATORY_CARE_PROVIDER_SITE_OTHER): Payer: Medicare Other | Admitting: Emergency Medicine

## 2018-12-31 ENCOUNTER — Other Ambulatory Visit: Payer: Self-pay

## 2018-12-31 ENCOUNTER — Encounter: Payer: Self-pay | Admitting: Emergency Medicine

## 2018-12-31 VITALS — BP 135/84 | HR 91 | Temp 98.0°F | Resp 16 | Ht 62.5 in | Wt 118.6 lb

## 2018-12-31 DIAGNOSIS — J22 Unspecified acute lower respiratory infection: Secondary | ICD-10-CM | POA: Diagnosis not present

## 2018-12-31 DIAGNOSIS — R059 Cough, unspecified: Secondary | ICD-10-CM

## 2018-12-31 DIAGNOSIS — R05 Cough: Secondary | ICD-10-CM | POA: Diagnosis not present

## 2018-12-31 DIAGNOSIS — J449 Chronic obstructive pulmonary disease, unspecified: Secondary | ICD-10-CM

## 2018-12-31 MED ORDER — AZITHROMYCIN 250 MG PO TABS: ORAL_TABLET | ORAL | 0 refills | Status: DC

## 2018-12-31 MED ORDER — PREDNISONE 20 MG PO TABS: 20.0000 mg | ORAL_TABLET | Freq: Every day | ORAL | 0 refills | Status: AC

## 2018-12-31 MED ORDER — BENZONATATE 200 MG PO CAPS: 200.0000 mg | ORAL_CAPSULE | Freq: Two times a day (BID) | ORAL | 0 refills | Status: DC | PRN

## 2018-12-31 NOTE — Patient Instructions (Addendum)
     If you have lab work done today you will be contacted with your lab results within the next 2 weeks.  If you have not heard from [name] then please contact us. The fastest way to get your results is to register for My Chart.   IF you received an x-ray today, you will receive an invoice from [facility]. Please contact [facility] at [phone] with questions or concerns regarding your invoice.   IF you received labwork today, you will receive an invoice from [place]. Please contact [facility] at [phone] with questions or concerns regarding your invoice.   Our billing staff will not be able to assist you with questions regarding bills from these companies.  You will be contacted with the lab results as soon as they are available. The fastest way to get your results is to activate your My Chart account. Instructions are located on the last page of this paperwork. If you have not heard from [name] regarding the results in 2 weeks, please contact this office.     Acute Bronchitis, Adult Acute bronchitis is when air tubes (bronchi) in the lungs suddenly get swollen. The condition can make it hard to breathe. It can also cause these symptoms:  A cough.  Coughing up clear, yellow, or green mucus.  Wheezing.  Chest congestion.  Shortness of breath.  A fever.  Body aches.  Chills.  A sore throat. Follow these instructions at home:  Medicines  Take over-the-counter and prescription medicines only as told by your doctor.  If you were prescribed an antibiotic medicine, take it as told by your doctor. Do not stop taking the antibiotic even if you start to feel better. General instructions  Rest.  Drink enough fluids to keep your pee (urine) pale yellow.  Avoid smoking and secondhand smoke. If you smoke and you need help quitting, ask your doctor. Quitting will help your lungs heal faster.  Use an inhaler, cool mist vaporizer, or humidifier as told by your  doctor.  Keep all follow-up visits as told by your doctor. This is important. How is this prevented? To lower your risk of getting this condition again:  Wash your hands often with soap and water. If you cannot use soap and water, use hand sanitizer.  Avoid contact with people who have cold symptoms.  Try not to touch your hands to your mouth, nose, or eyes.  Make sure to get the flu shot every year. Contact a doctor if:  Your symptoms do not get better in 2 weeks. Get help right away if:  You cough up blood.  You have chest pain.  You have very bad shortness of breath.  You become dehydrated.  You faint (pass out) or keep feeling like you are going to pass out.  You keep throwing up (vomiting).  You have a very bad headache.  Your fever or chills gets worse. This information is not intended to replace advice given to you by your health care provider. Make sure you discuss any questions you have with your health care provider. Document Released: 05/05/2008 Document Revised: 07/01/2017 Document Reviewed: 05/07/2016 Elsevier Interactive Patient Education  2019 [employer].

## 2018-12-31 NOTE — Progress Notes (Signed)
Catherine Kelley 68 y.o.   Chief Complaint  Patient presents with  . Cough    productive yellow and mucus not better  . Headache    x 3 day off and on    HISTORY OF PRESENT ILLNESS: This is a 68 y.o. female seen by me on 12/22/2018 with cough and lower respiratory infection, here for follow-up.  Patient has a history of COPD, still smoking, still sick.  Complaining of cough with sputum production.  Denies fever or chills.  Denies more difficulty breathing than usual.  Denies chest pain.  Still not feeling better with persistent symptoms.  Has also developed an intermittent headache.  Just finished doxycycline twice a day.  HPI   Prior to Admission medications   Medication Sig Start Date End Date Taking? Authorizing Provider  albuterol (PROVENTIL) (2.5 MG/3ML) 0.083% nebulizer solution Albuterol nebulizer  4 TIMES DAILY 04/29/18  Yes Weber, Damaris Hippo, PA-C  aspirin EC 81 MG tablet Take 81-162 mg by mouth See admin instructions. 81 mg once a day "for heart health" and 162 mg one to two times a day as needed for headaches   Yes [provider]  budesonide-formoterol (SYMBICORT) 160-4.5 MCG/ACT inhaler Inhale 2 puffs into the lungs 2 (two) times daily. 09/19/17  Yes Patrecia Pour, Christean Grief, MD  calcium carbonate (TUMS EX) 750 MG chewable tablet Chew 1 tablet by mouth 2 (two) times daily as needed for heartburn.   Yes [provider]  divalproex (DEPAKOTE ER) 500 MG 24 hr tablet TAKE 1 TABLET BY MOUTH EVERYDAY AT BEDTIME 12/17/18  Yes Arfeen, Arlyce Harman, MD  fluticasone (FLONASE) 50 MCG/ACT nasal spray Place 2 sprays into both nostrils daily. 02/19/18  Yes Weber, Sarah L, PA-C  gabapentin (NEURONTIN) 100 MG capsule 1 po qam, 1 at noon and 2 po qhs 12/17/18  Yes Arfeen, Arlyce Harman, MD  nortriptyline (PAMELOR) 50 MG capsule TAKE 2 CAPSULES BY MOUTH NIGHTLY 12/17/18  Yes Arfeen, Arlyce Harman, MD  spironolactone (ALDACTONE) 50 MG tablet Take 1 tablet (50 mg total) by mouth daily. Office visit needed for  refills 11/30/18  Yes Rutherford Guys, MD  tiZANidine (ZANAFLEX) 4 MG tablet Take 1 tablet (4 mg total) by mouth at bedtime. 10/15/18  Yes Rutherford Guys, MD  triamcinolone cream (KENALOG) 0.1 % Apply 1 application topically 2 (two) times daily. 09/11/18  Yes Posey Boyer, MD  venlafaxine (EFFEXOR) 37.5 MG tablet Take 1 tablet (37.5 mg total) by mouth daily. 12/17/18 12/17/19 Yes Arfeen, Arlyce Harman, MD    Allergies  Allergen Reactions  . Sulfa Drugs Cross Reactors Hives and Rash    Hives and rash  . Ceclor [Cefaclor] Hives    Tolerated ceftazidime December 2016  . Depakote Er [Divalproex Sodium Er] Other (See Comments)    Patient remarked that when she was taking this TWICE a day, she became "clumsy" and felt more prone to stumbling  . Escitalopram Oxalate Other (See Comments)    Pt does not recall ever taking medication (??)  . Sertraline Hcl     Severe headache    Patient Active Problem List   Diagnosis Date Noted  . Lower respiratory infection 12/22/2018  . Senile ecchymosis 09/11/2018  . CAP (community acquired pneumonia) 09/17/2017  . Near syncope 09/17/2017  . Tobacco abuse 02/12/2016  . Other fatigue 02/04/2016  . Respiratory failure with hypoxia ([place]) 01/09/2016  . Malnutrition of moderate degree 11/03/2015  . Pulmonary emphysema ([place])   . Essential hypertension 09/27/2015  .  Rhabdomyolysis 08/16/2015  . Cough 06/07/2015  . Bronchiectasis with acute exacerbation ([place]) 03/07/2015  . Cigarette smoker 12/13/2014  . Stage 2 moderate COPD by GOLD classification ([place]) 10/11/2014  . Lumbar back pain with radiculopathy affecting right lower extremity 09/28/2014  . Hyponatremia 07/09/2014  . Hx of recurrent pneumonia 07/08/2014  . Venous insufficiency (chronic) (peripheral) 09/29/2012  . Lower extremity edema 03/09/2012  . Benign hypertensive heart disease without heart failure 05/09/2011  . Chest pain 05/09/2011  . Dyslipidemia 05/09/2011  . Mood disorder ([place]) 05/09/2011   . Osteoarthritis 05/09/2011    Past Medical History:  Diagnosis Date  . Anxiety   . Arthritis    "jooints" (09/18/2017)  . Asthma   . Bipolar disorder ([place])   . CAP (community acquired pneumonia) 09/18/2017  . Cat allergies   . Chronic bronchitis ([place])    "get it alot; maybe not q yr" (07/06/2014)  . Chronic lower back pain    "spurs and pinched nerves" (09/18/2017)  . COPD (chronic obstructive pulmonary disease) ([place])   . DDD (degenerative disc disease), lumbar   . Depression    psychiatrist Dr. Toy Care  . Diastolic dysfunction   . Emphysematous COPD ([place])    changes in right base along with patchy areas on last x-ray  . Falls frequently    "several in 2017; fell at dr's office yesterday" (09/18/2017)  . GERD (gastroesophageal reflux disease)   . HCAP (healthcare-associated pneumonia) 02/12/2016  . Heart murmur   . History of cardiovascular stress test 08/15/2004   EF of 70% -- Normal stress cardiolite.  There is no evidence of ischemia and there is normal LV function. -- [name]. MD  . History of echocardiogram 12/24/2006   Est. EF of 95-63% NORMAL LV SYSTOLIC FUNCTION WITH IMPAIRED RELAXATION -- MILD AORTIC SCLEROSIS -- NORMAL PALONARY ARTERY PRESSURE -- NO OLD ECHOS FOR COMPARISON -- [name], MD  . Hypertension    essential hypertension  . Migraine    "when I was having my periods; none since then" (09/18/2017)  . Necrotic pneumonia ([place])    recurrent/notes 02/12/2016  . Pneumonia    "several times since May 2015" (07/06/2014)  . Pollen allergies   . Tobacco abuse    smoking about 1/2 pk of cigarettes a day    Past Surgical History:  Procedure Laterality Date  . CAROTID STENT    . DILATION AND CURETTAGE OF UTERUS    . TUBAL LIGATION  1986    Social History   Socioeconomic History  . Marital status: Married    Spouse name: richard  . Number of children: 2  . Years of education: Not on file  . Highest education level: Associate degree:  occupational, Hotel manager, or vocational program  Occupational History  . Occupation: Chemical engineer: [employer]    Comment: [employer]  Social Needs  . Financial resource strain: Somewhat hard  . Food insecurity:    Worry: Sometimes true    Inability: Sometimes true  . Transportation needs:    Medical: No    Non-medical: No  Tobacco Use  . Smoking status: Current Every Day Smoker    Packs/day: 0.50    Years: 46.00    Pack years: 23.00    Types: Cigarettes  . Smokeless tobacco: Never Used  Substance and Sexual Activity  . Alcohol use: Yes    Alcohol/week: 0.0 standard drinks    Comment: 09/18/2017 "might have 1 drink/month; if that"  . Drug use: No  .  Sexual activity: Not Currently  Lifestyle  . Physical activity:    Days per week: 0 days    Minutes per session: 0 min  . Stress: Very much  Relationships  . Social connections:    Talks on phone: Not on file    Gets together: Not on file    Attends religious service: More than 4 times per year    Active member of club or organization: No    Attends meetings of clubs or organizations: Never    Relationship status: Married  . Intimate partner violence:    Fear of current or ex partner: No    Emotionally abused: No    Physically abused: No    Forced sexual activity: No  Other Topics Concern  . Not on file  Social History Narrative   Patient currently lives with her husband.    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 No bird or hot tub exposure. No mold exposure. No recent travel.    Family History  Problem Relation Age of Onset  . Stroke Mother   . Heart disease Father   . Hyperlipidemia Father   . Hypertension Father   . Breast cancer Maternal Aunt   . Asthma Maternal Grandmother      Review of Systems  Constitutional: Negative.  Negative for chills and fever.  HENT: Positive for congestion. Negative for sore  throat.   Eyes: Negative.   Respiratory: Positive for cough and sputum production. Negative for hemoptysis, shortness of breath and wheezing.   Cardiovascular: Negative.  Negative for chest pain and palpitations.  Gastrointestinal: Negative for abdominal pain, diarrhea, nausea and vomiting.  Genitourinary: Negative.   Musculoskeletal: Negative.   Skin: Negative.  Negative for rash.  Neurological: Negative.  Negative for dizziness and headaches.  Endo/Heme/Allergies: Negative.   All other systems reviewed and are negative.   Vitals:   12/31/18 0833  BP: 135/84  Pulse: 91  Resp: 16  Temp: 98 F (36.7 C)  SpO2: 99%    Physical Exam Vitals signs reviewed.  Constitutional:      Appearance: Normal appearance.  HENT:     Head: Normocephalic and atraumatic.     Nose: Nose normal.     Mouth/Throat:     Mouth: Mucous membranes are moist.     Pharynx: Oropharynx is clear.  Eyes:     Extraocular Movements: Extraocular movements intact.     Conjunctiva/sclera: Conjunctivae normal.     Pupils: Pupils are equal, round, and reactive to light.  Neck:     Musculoskeletal: Normal range of motion and neck supple.  Cardiovascular:     Rate and Rhythm: Normal rate and regular rhythm.     Heart sounds: Normal heart sounds.  Pulmonary:     Effort: Pulmonary effort is normal.     Breath sounds: Rales (Right lower posterior third, chronic from fibrosis) present. No wheezing.  Abdominal:     Palpations: Abdomen is soft.     Tenderness: There is no abdominal tenderness.  Musculoskeletal: Normal range of motion.  Skin:    General: Skin is warm and dry.  Neurological:     General: No focal deficit present.     Mental Status: She is alert and oriented to person, place, and time.  Psychiatric:        Mood and Affect: Mood normal.  Behavior: Behavior normal.    A total of 25 minutes was spent in the room with the patient, greater than 50% of which was in counseling/coordination of  care regarding differential diagnosis, management and treatment, medications and side effects, review of x-ray done during last visit, and need for follow-up if no better or worse.   ASSESSMENT & PLAN: Markeeta was seen today for cough and headache.  Diagnoses and all orders for this visit:  Lower respiratory infection -     azithromycin (ZITHROMAX) 250 MG tablet; Sig as indicated  Cough -     benzonatate (TESSALON) 200 MG capsule; Take 1 capsule (200 mg total) by mouth 2 (two) times daily as needed for cough.  Stage 2 moderate COPD by GOLD classification (HCC) -     predniSONE (DELTASONE) 20 MG tablet; Take 1 tablet (20 mg total) by mouth daily with breakfast for 5 days.    Patient Instructions       If you have lab work done today you will be contacted with your lab results within the next 2 weeks.  If you have not heard from [name] then please contact us. The fastest way to get your results is to register for My Chart.   IF you received an x-ray today, you will receive an invoice from [facility]. Please contact [facility] at [phone] with questions or concerns regarding your invoice.   IF you received labwork today, you will receive an invoice from [facility]. Please contact [facility] at [phone] with questions or concerns regarding your invoice.   Our billing staff will not be able to assist you with questions regarding bills from these companies.  You will be contacted with the lab results as soon as they are available. The fastest way to get your results is to activate your My Chart account. Instructions are located on the last page of this paperwork. If you have not heard from [name] regarding the results in 2 weeks, please contact this office.     Acute Bronchitis, Adult Acute bronchitis is when air tubes (bronchi) in the lungs suddenly get swollen. The condition can make it hard to breathe. It can also cause these symptoms:  A cough.  Coughing up  clear, yellow, or green mucus.  Wheezing.  Chest congestion.  Shortness of breath.  A fever.  Body aches.  Chills.  A sore throat. Follow these instructions at home:  Medicines  Take over-the-counter and prescription medicines only as told by your doctor.  If you were prescribed an antibiotic medicine, take it as told by your doctor. Do not stop taking the antibiotic even if you start to feel better. General instructions  Rest.  Drink enough fluids to keep your pee (urine) pale yellow.  Avoid smoking and secondhand smoke. If you smoke and you need help quitting, ask your doctor. Quitting will help your lungs heal faster.  Use an inhaler, cool mist vaporizer, or humidifier as told by your doctor.  Keep all follow-up visits as told by your doctor. This is important. How is this prevented? To lower your risk of getting this condition again:  Wash your hands often with soap and water. If you cannot use soap and water, use hand sanitizer.  Avoid contact with people who have cold symptoms.  Try not to touch your hands to your mouth, nose, or eyes.  Make sure to get the flu shot every year. Contact a doctor if:  Your symptoms do not get better in 2 weeks. Get  help right away if:  You cough up blood.  You have chest pain.  You have very bad shortness of breath.  You become dehydrated.  You faint (pass out) or keep feeling like you are going to pass out.  You keep throwing up (vomiting).  You have a very bad headache.  Your fever or chills gets worse. This information is not intended to replace advice given to you by your health care provider. Make sure you discuss any questions you have with your health care provider. Document Released: 05/05/2008 Document Revised: 07/01/2017 Document Reviewed: 05/07/2016 Elsevier Interactive Patient Education  2019 Elsevier Inc.      Agustina Caroli, MD [facility]

## 2019-02-11 ENCOUNTER — Ambulatory Visit: Payer: Self-pay | Admitting: Family Medicine

## 2019-02-12 ENCOUNTER — Other Ambulatory Visit: Payer: Self-pay | Admitting: Family Medicine

## 2019-02-12 DIAGNOSIS — R600 Localized edema: Secondary | ICD-10-CM

## 2019-02-14 NOTE — Telephone Encounter (Signed)
Courtesy of 30 tabs given until next office visit  03/11/2019. Requested Prescriptions  Pending Prescriptions Disp Refills  . spironolactone (ALDACTONE) 50 MG tablet [Pharmacy Med Name: SPIRONOLACTONE 50 MG TABLET] 30 tablet 0    Sig: TAKE 1 TABLET (50 MG TOTAL) BY MOUTH DAILY. OFFICE VISIT NEEDED FOR REFILLS     Cardiovascular: Diuretics - Aldosterone Antagonist Passed - 02/12/2019 10:38 AM      Passed - Cr in normal range and within 360 days    Creat  Date Value Ref Range Status  06/27/2016 0.45 (L) 0.50 - 0.99 mg/dL Final    Comment:      For patients > or = 69 years of age: The upper reference limit for Creatinine is approximately 13% higher for people identified as African-American.      Creatinine, Ser  Date Value Ref Range Status  10/15/2018 0.66 0.57 - 1.00 mg/dL Final         Passed - K in normal range and within 360 days    Potassium  Date Value Ref Range Status  10/15/2018 4.1 3.5 - 5.2 mmol/L Final         Passed - Na in normal range and within 360 days    Sodium  Date Value Ref Range Status  10/15/2018 137 134 - 144 mmol/L Final         Passed - Last BP in normal range    BP Readings from Last 1 Encounters:  12/31/18 135/84         Passed - Valid encounter within last 6 months    Recent Outpatient Visits          1 month ago Lower respiratory infection   [facility] Care at [facility], Ines Bloomer, MD   1 month ago Cough   [facility] Care at [facility], Ines Bloomer, MD   4 months ago Dizziness   [facility] at [facility], Lilia Argue, MD   5 months ago Hand eczema   Primary Care at [place], MD   7 months ago Muscle spasm   [facility], Damaris Hippo, PA-C      Future Appointments            In 3 weeks Rutherford Guys, MD Primary Care at [place], [facility]

## 2019-02-24 ENCOUNTER — Other Ambulatory Visit (HOSPITAL_COMMUNITY): Payer: Self-pay

## 2019-02-24 DIAGNOSIS — F418 Other specified anxiety disorders: Secondary | ICD-10-CM

## 2019-02-24 DIAGNOSIS — F411 Generalized anxiety disorder: Secondary | ICD-10-CM

## 2019-02-24 DIAGNOSIS — F319 Bipolar disorder, unspecified: Secondary | ICD-10-CM

## 2019-02-24 MED ORDER — NORTRIPTYLINE HCL 50 MG PO CAPS: ORAL_CAPSULE | ORAL | 0 refills | Status: DC

## 2019-02-24 MED ORDER — GABAPENTIN 100 MG PO CAPS: ORAL_CAPSULE | ORAL | 0 refills | Status: DC

## 2019-02-24 MED ORDER — VENLAFAXINE HCL 37.5 MG PO TABS: 37.5000 mg | ORAL_TABLET | Freq: Every day | ORAL | 0 refills | Status: DC

## 2019-02-24 MED ORDER — DIVALPROEX SODIUM ER 500 MG PO TB24: ORAL_TABLET | ORAL | 0 refills | Status: DC

## 2019-02-25 ENCOUNTER — Ambulatory Visit (HOSPITAL_COMMUNITY): Payer: 59 | Admitting: Psychiatry

## 2019-03-08 ENCOUNTER — Other Ambulatory Visit: Payer: Self-pay | Admitting: Family Medicine

## 2019-03-08 DIAGNOSIS — R600 Localized edema: Secondary | ICD-10-CM

## 2019-03-11 ENCOUNTER — Telehealth (INDEPENDENT_AMBULATORY_CARE_PROVIDER_SITE_OTHER): Payer: Medicare Other | Admitting: Family Medicine

## 2019-03-11 ENCOUNTER — Other Ambulatory Visit: Payer: Self-pay

## 2019-03-11 DIAGNOSIS — I10 Essential (primary) hypertension: Secondary | ICD-10-CM | POA: Diagnosis not present

## 2019-03-11 DIAGNOSIS — G8929 Other chronic pain: Secondary | ICD-10-CM

## 2019-03-11 DIAGNOSIS — M545 Low back pain: Secondary | ICD-10-CM

## 2019-03-11 DIAGNOSIS — J449 Chronic obstructive pulmonary disease, unspecified: Secondary | ICD-10-CM

## 2019-03-11 DIAGNOSIS — F39 Unspecified mood [affective] disorder: Secondary | ICD-10-CM

## 2019-03-11 MED ORDER — TIZANIDINE HCL 4 MG PO TABS: 4.0000 mg | ORAL_TABLET | Freq: Every day | ORAL | 1 refills | Status: DC

## 2019-03-11 MED ORDER — ALBUTEROL SULFATE HFA 108 (90 BASE) MCG/ACT IN AERS: 2.0000 | INHALATION_SPRAY | Freq: Four times a day (QID) | RESPIRATORY_TRACT | 0 refills | Status: DC | PRN

## 2019-03-11 MED ORDER — SPIRONOLACTONE 50 MG PO TABS: 50.0000 mg | ORAL_TABLET | Freq: Every day | ORAL | 1 refills | Status: DC

## 2019-03-11 NOTE — Progress Notes (Signed)
HTN F/u

## 2019-03-11 NOTE — Progress Notes (Signed)
Virtual Visit via telephone Note  I connected with patient on 03/11/19 at 1027am by telephone and verified that I am speaking with the correct person using two identifiers. Catherine Kelley is currently located at home and patient is currently with her during visit. The provider, Rutherford Guys, MD is located in their office at time of visit.  I discussed the limitations, risks, security and privacy concerns of performing an evaluation and management service by telephone and the availability of in person appointments. I also discussed with the patient that there may be a patient responsible charge related to this service. The patient expressed understanding and agreed to proceed.  No chief complaint on file.   Telephone visit today for HTN followup  HPI 68 yo Female with PMH of bipolar I disorder, GAD, depression, htn, varicose veins, sciatica and COPD who presents for HTN followup  Last OV with me in Nov 2019 Has since seen psychiatry x 2 Also seen in Jan 2020 for COPD exacerbation  Patient reports she has been really depressed Has upcoming appt with psychiatry Denies SI Lives with her husband  Breathing back to baseline Has not been taking symbicort for past several months Does not have any albuterol at home, does not feel she needs it Smokes  Does not check BP at home Takes spironolactone daily Denies any dizziness, chest pain, SOB, palpitations BP Readings from Last 3 Encounters:  12/31/18 135/84  12/22/18 (!) 147/81  10/15/18 134/74   Requesting refill of tizanidine which she takes prn at bedtime when her back is bothering her  Fall Risk  03/11/2019 12/31/2018 12/22/2018 10/15/2018 06/15/2018  Falls in the past year? 0 1 1 - No  Comment - - - - -  Number falls in past yr: 0 1 1 1  -  Injury with Fall? 0 0 0 (No Data) -  Comment - - - broken skin -  Risk for fall due to : - - - History of fall(s) -  Follow up - Falls evaluation completed;Falls prevention discussed - - -     Depression screen [facility] 2/9 03/11/2019 12/22/2018 10/15/2018  Decreased Interest 1 0 1  Down, Depressed, Hopeless 1 0 1  PHQ - 2 Score 2 0 2  Altered sleeping 0 - 2  Tired, decreased energy 2 - 2  Change in appetite 0 - 2  Feeling bad or failure about yourself  0 - 0  Trouble concentrating 0 - 1  Moving slowly or fidgety/restless 0 - 0  Suicidal thoughts 0 - 0  PHQ-9 Score 4 - 9  Some recent data might be hidden    Allergies  Allergen Reactions  . Sulfa Drugs Cross Reactors Hives and Rash    Hives and rash  . Ceclor [Cefaclor] Hives    Tolerated ceftazidime December 2016  . Depakote Er [Divalproex Sodium Er] Other (See Comments)    Patient remarked that when she was taking this TWICE a day, she became "clumsy" and felt more prone to stumbling  . Escitalopram Oxalate Other (See Comments)    Pt does not recall ever taking medication (??)  . Sertraline Hcl     Severe headache    Prior to Admission medications   Medication Sig Start Date End Date Taking? Authorizing Provider  albuterol (PROVENTIL) (2.5 MG/3ML) 0.083% nebulizer solution Albuterol nebulizer  4 TIMES DAILY 04/29/18  Yes Weber, Sarah L, PA-C  budesonide-formoterol (SYMBICORT) 160-4.5 MCG/ACT inhaler Inhale 2 puffs into the lungs 2 (two) times daily. 09/19/17  Yes Patrecia Pour, Christean Grief, MD  calcium carbonate (TUMS EX) 750 MG chewable tablet Chew 1 tablet by mouth 2 (two) times daily as needed for heartburn.   Yes [provider]  divalproex (DEPAKOTE ER) 500 MG 24 hr tablet TAKE 1 TABLET BY MOUTH EVERYDAY AT BEDTIME 02/24/19  Yes Arfeen, Arlyce Harman, MD  fluticasone (FLONASE) 50 MCG/ACT nasal spray Place 2 sprays into both nostrils daily. 02/19/18  Yes Weber, Sarah L, PA-C  gabapentin (NEURONTIN) 100 MG capsule 1 po qam, 1 at noon and 2 po qhs 02/24/19  Yes Arfeen, Arlyce Harman, MD  nortriptyline (PAMELOR) 50 MG capsule TAKE 2 CAPSULES BY MOUTH NIGHTLY 02/24/19  Yes Arfeen, Arlyce Harman, MD  spironolactone (ALDACTONE) 50 MG tablet TAKE  1 TABLET (50 MG TOTAL) BY MOUTH DAILY. OFFICE VISIT NEEDED FOR REFILLS 03/08/19  Yes Rutherford Guys, MD  tiZANidine (ZANAFLEX) 4 MG tablet Take 1 tablet (4 mg total) by mouth at bedtime. 10/15/18  Yes Rutherford Guys, MD  triamcinolone cream (KENALOG) 0.1 % Apply 1 application topically 2 (two) times daily. 09/11/18  Yes Posey Boyer, MD  venlafaxine (EFFEXOR) 37.5 MG tablet Take 1 tablet (37.5 mg total) by mouth daily. 02/24/19 02/24/20 Yes Arfeen, Arlyce Harman, MD  aspirin EC 81 MG tablet Take 81-162 mg by mouth See admin instructions. 81 mg once a day "for heart health" and 162 mg one to two times a day as needed for headaches    [provider]  benzonatate (TESSALON) 200 MG capsule Take 1 capsule (200 mg total) by mouth 2 (two) times daily as needed for cough. Patient not taking: Reported on 03/11/2019 12/31/18   Horald Pollen, MD    Past Medical History:  Diagnosis Date  . Anxiety   . Arthritis    "jooints" (09/18/2017)  . Asthma   . Bipolar disorder ([place])   . CAP (community acquired pneumonia) 09/18/2017  . Cat allergies   . Chronic bronchitis ([place])    "get it alot; maybe not q yr" (07/06/2014)  . Chronic lower back pain    "spurs and pinched nerves" (09/18/2017)  . COPD (chronic obstructive pulmonary disease) ([place])   . DDD (degenerative disc disease), lumbar   . Depression    psychiatrist Dr. Toy Care  . Diastolic dysfunction   . Emphysematous COPD ([place])    changes in right base along with patchy areas on last x-ray  . Falls frequently    "several in 2017; fell at dr's office yesterday" (09/18/2017)  . GERD (gastroesophageal reflux disease)   . HCAP (healthcare-associated pneumonia) 02/12/2016  . Heart murmur   . History of cardiovascular stress test 08/15/2004   EF of 70% -- Normal stress cardiolite.  There is no evidence of ischemia and there is normal LV function. -- [name]. MD  . History of echocardiogram 12/24/2006   Est. EF of 55-60% NORMAL LV  SYSTOLIC FUNCTION WITH IMPAIRED RELAXATION -- MILD AORTIC SCLEROSIS -- NORMAL PALONARY ARTERY PRESSURE -- NO OLD ECHOS FOR COMPARISON -- [name], MD  . Hypertension    essential hypertension  . Migraine    "when I was having my periods; none since then" (09/18/2017)  . Necrotic pneumonia ([place])    recurrent/notes 02/12/2016  . Pneumonia    "several times since May 2015" (07/06/2014)  . Pollen allergies   . Tobacco abuse    smoking about 1/2 pk of cigarettes a day    Past Surgical History:  Procedure Laterality Date  . CAROTID  STENT    . DILATION AND CURETTAGE OF UTERUS    . TUBAL LIGATION  1986    Social History   Tobacco Use  . Smoking status: Current Every Day Smoker    Packs/day: 0.50    Years: 46.00    Pack years: 23.00    Types: Cigarettes  . Smokeless tobacco: Never Used  Substance Use Topics  . Alcohol use: Yes    Alcohol/week: 0.0 standard drinks    Comment: 09/18/2017 "might have 1 drink/month; if that"    Family History  Problem Relation Age of Onset  . Stroke Mother   . Heart disease Father   . Hyperlipidemia Father   . Hypertension Father   . Breast cancer Maternal Aunt   . Asthma Maternal Grandmother     ROS Per hpi  Objective  Vitals as reported by the patient: none  There were no vitals filed for this visit.  ASSESSMENT and PLAN  1. Essential hypertension, benign Controlled. Continue current regime.  - spironolactone (ALDACTONE) 50 MG tablet; Take 1 tablet (50 mg total) by mouth daily. Office visit needed for refills - Comprehensive metabolic panel; Future - CBC; Future  2. Chronic bilateral low back pain without sciatica - tiZANidine (ZANAFLEX) 4 MG tablet; Take 1 tablet (4 mg total) by mouth at bedtime.  3. Stage 2 moderate COPD by GOLD classification ([place]) Discussed importance of medication adherence and difference between Symbicort and albuterol. Cont to smoke, not interested in quitting.   4. Mood disorder ([place]) Managed by  psychiatry  Other orders - albuterol (PROVENTIL HFA;VENTOLIN HFA) 108 (90 Base) MCG/ACT inhaler; Inhale 2 puffs into the lungs every 6 (six) hours as needed for wheezing or shortness of breath.  FOLLOW-UP: 3 months with labs 3-4 days prior   The above assessment and management plan was discussed with the patient. The patient verbalized understanding of and has agreed to the management plan. Patient is aware to call the clinic if symptoms persist or worsen. Patient is aware when to return to the clinic for a follow-up visit. Patient educated on when it is appropriate to go to the emergency department.    I provided 23 minutes of non-face-to-face time during this encounter.  Rutherford Guys, MD Primary Care at [place], [place] [address] Ph.  [phone] Fax [phone]

## 2019-03-28 ENCOUNTER — Other Ambulatory Visit: Payer: Self-pay | Admitting: Family Medicine

## 2019-03-28 ENCOUNTER — Telehealth: Payer: Self-pay | Admitting: Family Medicine

## 2019-03-28 MED ORDER — MELOXICAM 15 MG PO TABS: 15.0000 mg | ORAL_TABLET | Freq: Every day | ORAL | 0 refills | Status: DC

## 2019-03-28 NOTE — Telephone Encounter (Signed)
Requested medication (s) are due for refill today: yes  Requested medication (s) are on the active medication list: NO  Last refill:  06-16-19  Future visit scheduled: yes  Notes to clinic:  Medication not delegated to NT to refill   Requested Prescriptions  Pending Prescriptions Disp Refills   HYDROcodone-acetaminophen (NORCO/VICODIN) 5-325 MG tablet 20 tablet 0    Sig: Take 1 tablet by mouth every 6 (six) hours as needed for up to 5 days for moderate pain.     Not Delegated - Analgesics:  Opioid Agonist Combinations Failed - 03/28/2019 12:24 PM      Failed - This refill cannot be delegated      Failed - Urine Drug Screen completed in last 360 days.      Passed - Valid encounter within last 6 months    Recent Outpatient Visits          2 months ago Lower respiratory infection   Primary Care at [facility], Ines Bloomer, MD   3 months ago Cough   Primary Care at [facility], Ines Bloomer, MD   5 months ago Dizziness   [facility] at [facility], Lilia Argue, MD   6 months ago Hand eczema   [facility] at [place], MD   8 months ago Muscle spasm   [facility] at [facility], Damaris Hippo, PA-C      Future Appointments            In 2 months Rutherford Guys, MD Primary Care at [place], [facility]

## 2019-03-28 NOTE — Telephone Encounter (Signed)
Patent would like something called in for sciatic nerve pain

## 2019-03-30 NOTE — Telephone Encounter (Signed)
Has apt tomorrow

## 2019-03-31 ENCOUNTER — Other Ambulatory Visit: Payer: Self-pay

## 2019-03-31 ENCOUNTER — Telehealth (INDEPENDENT_AMBULATORY_CARE_PROVIDER_SITE_OTHER): Payer: Medicare Other | Admitting: Family Medicine

## 2019-03-31 DIAGNOSIS — M5416 Radiculopathy, lumbar region: Secondary | ICD-10-CM

## 2019-03-31 DIAGNOSIS — H9201 Otalgia, right ear: Secondary | ICD-10-CM | POA: Diagnosis not present

## 2019-03-31 MED ORDER — HYDROCODONE-ACETAMINOPHEN 5-325 MG PO TABS: 1.0000 | ORAL_TABLET | Freq: Four times a day (QID) | ORAL | 0 refills | Status: DC | PRN

## 2019-03-31 MED ORDER — METHOCARBAMOL 500 MG PO TABS: 500.0000 mg | ORAL_TABLET | Freq: Three times a day (TID) | ORAL | 1 refills | Status: DC | PRN

## 2019-03-31 NOTE — Progress Notes (Signed)
Virtual Visit Note  I connected with patient on 03/31/19 at 1017am by phone and verified that I am speaking with the correct person using two identifiers. Catherine Kelley is currently located at home and patient is currently with them during visit. The provider, Rutherford Guys, MD is located in their office at time of visit.  I discussed the limitations, risks, security and privacy concerns of performing an evaluation and management service by telephone and the availability of in person appointments. I also discussed with the patient that there may be a patient responsible charge related to this service. The patient expressed understanding and agreed to proceed.   CC: right ear pain  HPI ? 68 yo Female with PMH of bipolar I disorder, GAD, depression, htn, varicose veins, sciatica and COPD who presents for right ear pain and worsening sciatica  Right ear bothering her for past several weeks Feels like swimmers ears Hearing is muffled Has been removing some wax Denies any purulent discharge Denies any fever or chills  Had a flare up of sciatica for past 2 days Taking gabapentin 200mg  BID tizanidine is not helping much  Allergies  Allergen Reactions  . Sulfa Drugs Cross Reactors Hives and Rash    Hives and rash  . Ceclor [Cefaclor] Hives    Tolerated ceftazidime December 2016  . Depakote Er [Divalproex Sodium Er] Other (See Comments)    Patient remarked that when she was taking this TWICE a day, she became "clumsy" and felt more prone to stumbling  . Escitalopram Oxalate Other (See Comments)    Pt does not recall ever taking medication (??)  . Sertraline Hcl     Severe headache    Prior to Admission medications   Medication Sig Start Date End Date Taking? Authorizing Provider  albuterol (PROVENTIL HFA;VENTOLIN HFA) 108 (90 Base) MCG/ACT inhaler Inhale 2 puffs into the lungs every 6 (six) hours as needed for wheezing or shortness of breath. 03/11/19   Rutherford Guys, MD   aspirin EC 81 MG tablet Take 81-162 mg by mouth See admin instructions. 81 mg once a day "for heart health" and 162 mg one to two times a day as needed for headaches    [provider]  budesonide-formoterol (SYMBICORT) 160-4.5 MCG/ACT inhaler Inhale 2 puffs into the lungs 2 (two) times daily. 09/19/17   Doreatha Lew, MD  calcium carbonate (TUMS EX) 750 MG chewable tablet Chew 1 tablet by mouth 2 (two) times daily as needed for heartburn.    [provider]  divalproex (DEPAKOTE ER) 500 MG 24 hr tablet TAKE 1 TABLET BY MOUTH EVERYDAY AT BEDTIME 02/24/19   Arfeen, Arlyce Harman, MD  fluticasone (FLONASE) 50 MCG/ACT nasal spray Place 2 sprays into both nostrils daily. 02/19/18   Gale Journey, Damaris Hippo, PA-C  gabapentin (NEURONTIN) 100 MG capsule 1 po qam, 1 at noon and 2 po qhs 02/24/19   Arfeen, Arlyce Harman, MD  meloxicam (MOBIC) 15 MG tablet Take 1 tablet (15 mg total) by mouth daily. 03/28/19   Rutherford Guys, MD  nortriptyline (PAMELOR) 50 MG capsule TAKE 2 CAPSULES BY MOUTH NIGHTLY 02/24/19   Arfeen, Arlyce Harman, MD  spironolactone (ALDACTONE) 50 MG tablet Take 1 tablet (50 mg total) by mouth daily. Office visit needed for refills 03/11/19   Rutherford Guys, MD  tiZANidine (ZANAFLEX) 4 MG tablet Take 1 tablet (4 mg total) by mouth at bedtime. 03/11/19   Rutherford Guys, MD  triamcinolone cream (KENALOG) 0.1 %  Apply 1 application topically 2 (two) times daily. 09/11/18   Posey Boyer, MD  venlafaxine (EFFEXOR) 37.5 MG tablet Take 1 tablet (37.5 mg total) by mouth daily. 02/24/19 02/24/20  Kathlee Nations, MD    Past Medical History:  Diagnosis Date  . Anxiety   . Arthritis    "jooints" (09/18/2017)  . Asthma   . Bipolar disorder ([place])   . CAP (community acquired pneumonia) 09/18/2017  . Cat allergies   . Chronic bronchitis ([place])    "get it alot; maybe not q yr" (07/06/2014)  . Chronic lower back pain    "spurs and pinched nerves" (09/18/2017)  . COPD (chronic obstructive pulmonary disease)  ([place])   . DDD (degenerative disc disease), lumbar   . Depression    psychiatrist Dr. Toy Care  . Diastolic dysfunction   . Emphysematous COPD ([place])    changes in right base along with patchy areas on last x-ray  . Falls frequently    "several in 2017; fell at dr's office yesterday" (09/18/2017)  . GERD (gastroesophageal reflux disease)   . HCAP (healthcare-associated pneumonia) 02/12/2016  . Heart murmur   . History of cardiovascular stress test 08/15/2004   EF of 70% -- Normal stress cardiolite.  There is no evidence of ischemia and there is normal LV function. -- [name]. MD  . History of echocardiogram 12/24/2006   Est. EF of 62-70% NORMAL LV SYSTOLIC FUNCTION WITH IMPAIRED RELAXATION -- MILD AORTIC SCLEROSIS -- NORMAL PALONARY ARTERY PRESSURE -- NO OLD ECHOS FOR COMPARISON -- [name], MD  . Hypertension    essential hypertension  . Migraine    "when I was having my periods; none since then" (09/18/2017)  . Necrotic pneumonia ([place])    recurrent/notes 02/12/2016  . Pneumonia    "several times since May 2015" (07/06/2014)  . Pollen allergies   . Tobacco abuse    smoking about 1/2 pk of cigarettes a day    Past Surgical History:  Procedure Laterality Date  . CAROTID STENT    . DILATION AND CURETTAGE OF UTERUS    . TUBAL LIGATION  1986    Social History   Tobacco Use  . Smoking status: Current Every Day Smoker    Packs/day: 0.50    Years: 46.00    Pack years: 23.00    Types: Cigarettes  . Smokeless tobacco: Never Used  Substance Use Topics  . Alcohol use: Yes    Alcohol/week: 0.0 standard drinks    Comment: 09/18/2017 "might have 1 drink/month; if that"    Family History  Problem Relation Age of Onset  . Stroke Mother   . Heart disease Father   . Hyperlipidemia Father   . Hypertension Father   . Breast cancer Maternal Aunt   . Asthma Maternal Grandmother     ROS Per hpi  Objective  Vitals as reported by the patient: none   ASSESSMENT and  PLAN  1. Right ear pain Seems cerumen impaction, discussed supportive measures at home and RTC precautions  2. Lumbar back pain with radiculopathy affecting right lower extremity Discussed supportive measures, new meds r/se/b and RTC precautions.   Other orders - methocarbamol (ROBAXIN) 500 MG tablet; Take 1 tablet (500 mg total) by mouth every 8 (eight) hours as needed for muscle spasms. - HYDROcodone-acetaminophen (NORCO/VICODIN) 5-325 MG tablet; Take 1 tablet by mouth every 6 (six) hours as needed for moderate pain.  FOLLOW-UP: prn   The above assessment and management plan was discussed with  the patient. The patient verbalized understanding of and has agreed to the management plan. Patient is aware to call the clinic if symptoms persist or worsen. Patient is aware when to return to the clinic for a follow-up visit. Patient educated on when it is appropriate to go to the emergency department.    I provided 15 minutes of non-face-to-face time during this encounter.  Rutherford Guys, MD Primary Care at [place], [place] [address] Ph.  [phone] Fax [phone]

## 2019-03-31 NOTE — Progress Notes (Signed)
Still having pain in the right ear. Says she is using vingegar and qtip for cleaning pulling out a lot of wax. Says it is hard to hear, eardrum seems like is covered with thin scab. Having pain in the back due to bending the wrong way 1 wk ago, using tylenol for the pain. She is not taking the tizanidine, doesn't feel as thought it works for her. Not taking the mobic because she read med should not be taken with the aspirin she takes 1 po daily. Wants to know is it safe to use both medications

## 2019-04-08 ENCOUNTER — Ambulatory Visit (HOSPITAL_COMMUNITY): Payer: 59 | Admitting: Psychiatry

## 2019-04-13 ENCOUNTER — Telehealth: Payer: Self-pay | Admitting: Family Medicine

## 2019-04-13 ENCOUNTER — Other Ambulatory Visit: Payer: Self-pay | Admitting: Emergency Medicine

## 2019-04-13 DIAGNOSIS — R059 Cough, unspecified: Secondary | ICD-10-CM

## 2019-04-13 DIAGNOSIS — R05 Cough: Secondary | ICD-10-CM

## 2019-04-13 NOTE — Telephone Encounter (Signed)
Pt is wanting refills multiple   FR please contact . Pt hard to follow   FR

## 2019-04-15 ENCOUNTER — Other Ambulatory Visit: Payer: Self-pay

## 2019-04-15 ENCOUNTER — Encounter: Payer: Self-pay | Admitting: Registered Nurse

## 2019-04-15 ENCOUNTER — Ambulatory Visit (INDEPENDENT_AMBULATORY_CARE_PROVIDER_SITE_OTHER): Payer: Medicare Other | Admitting: Psychiatry

## 2019-04-15 ENCOUNTER — Encounter (HOSPITAL_COMMUNITY): Payer: Self-pay | Admitting: Psychiatry

## 2019-04-15 ENCOUNTER — Ambulatory Visit (INDEPENDENT_AMBULATORY_CARE_PROVIDER_SITE_OTHER): Payer: Medicare Other | Admitting: Registered Nurse

## 2019-04-15 VITALS — BP 138/82 | HR 91 | Temp 97.9°F | Resp 16 | Ht 64.0 in | Wt 124.0 lb

## 2019-04-15 DIAGNOSIS — F319 Bipolar disorder, unspecified: Secondary | ICD-10-CM | POA: Diagnosis not present

## 2019-04-15 DIAGNOSIS — N3001 Acute cystitis with hematuria: Secondary | ICD-10-CM | POA: Diagnosis not present

## 2019-04-15 DIAGNOSIS — R300 Dysuria: Secondary | ICD-10-CM

## 2019-04-15 DIAGNOSIS — F411 Generalized anxiety disorder: Secondary | ICD-10-CM

## 2019-04-15 LAB — POCT URINALYSIS DIP (MANUAL ENTRY)
Bilirubin, UA: NEGATIVE
Glucose, UA: NEGATIVE mg/dL
Ketones, POC UA: NEGATIVE mg/dL
Nitrite, UA: NEGATIVE
Protein Ur, POC: NEGATIVE mg/dL
Spec Grav, UA: 1.015 (ref 1.010–1.025)
Urobilinogen, UA: 0.2 E.U./dL
pH, UA: 6 (ref 5.0–8.0)

## 2019-04-15 MED ORDER — NORTRIPTYLINE HCL 50 MG PO CAPS: ORAL_CAPSULE | ORAL | 0 refills | Status: DC

## 2019-04-15 MED ORDER — DIVALPROEX SODIUM ER 250 MG PO TB24: 750.0000 mg | ORAL_TABLET | Freq: Every day | ORAL | 2 refills | Status: DC

## 2019-04-15 MED ORDER — VENLAFAXINE HCL 75 MG PO TABS: 75.0000 mg | ORAL_TABLET | Freq: Every day | ORAL | 0 refills | Status: DC

## 2019-04-15 MED ORDER — CIPROFLOXACIN HCL 250 MG PO TABS: 250.0000 mg | ORAL_TABLET | Freq: Two times a day (BID) | ORAL | 0 refills | Status: DC

## 2019-04-15 NOTE — Telephone Encounter (Signed)
Has apt today 

## 2019-04-15 NOTE — Progress Notes (Signed)
UMP:NTIRWERX, Catherine Argue, MD Chief Complaint  Patient presents with  . Urinary Frequency    with some Dysuria x 4 days   . Foot Pain    both feet are red and swelling x 1 weeks   . Cough    with some congestion x 1 week     Current Issues:  Presents with 5 days of urgency and dysuria Associated symptoms include:  Mild flank pain, frequency, urgency, dysuria  History of similar symptoms with UTIs in past.   Denies aggravating or alleviating factors  Has not tried tx Sxs worsening since onset  Pt also reports an isolated episode of SOB 3 days prior to visit - this has resolved. Pt still has infrequent unproductive cough. Pt has concern d/t COPD status. Will continue to monitor, suggest f/u with PCP if sxs return. Pt is smoker.   Prior to Admission medications   Medication Sig Start Date End Date Taking? Authorizing Provider  albuterol (PROVENTIL HFA;VENTOLIN HFA) 108 (90 Base) MCG/ACT inhaler Inhale 2 puffs into the lungs every 6 (six) hours as needed for wheezing or shortness of breath. 03/11/19  Yes Rutherford Guys, MD  aspirin EC 81 MG tablet Take 81-162 mg by mouth See admin instructions. 81 mg once a day "for heart health" and 162 mg one to two times a day as needed for headaches   Yes [provider]  budesonide-formoterol (SYMBICORT) 160-4.5 MCG/ACT inhaler Inhale 2 puffs into the lungs 2 (two) times daily. 09/19/17  Yes Patrecia Pour, Christean Grief, MD  calcium carbonate (TUMS EX) 750 MG chewable tablet Chew 1 tablet by mouth 2 (two) times daily as needed for heartburn.   Yes [provider]  divalproex (DEPAKOTE ER) 250 MG 24 hr tablet Take 3 tablets (750 mg total) by mouth at bedtime. 04/15/19  Yes Arfeen, Arlyce Harman, MD  fluticasone (FLONASE) 50 MCG/ACT nasal spray Place 2 sprays into both nostrils daily. 02/19/18  Yes Weber, Damaris Hippo, PA-C  HYDROcodone-acetaminophen (NORCO/VICODIN) 5-325 MG tablet Take 1 tablet by mouth every 6 (six) hours as needed for moderate pain. 03/31/19   Yes Rutherford Guys, MD  meloxicam (MOBIC) 15 MG tablet Take 1 tablet (15 mg total) by mouth daily. 03/28/19  Yes Rutherford Guys, MD  methocarbamol (ROBAXIN) 500 MG tablet Take 1 tablet (500 mg total) by mouth every 8 (eight) hours as needed for muscle spasms. 03/31/19  Yes Rutherford Guys, MD  nortriptyline (PAMELOR) 50 MG capsule TAKE ONE TO TWO CAPSULES BY MOUTH NIGHTLY 04/15/19  Yes Arfeen, Arlyce Harman, MD  spironolactone (ALDACTONE) 50 MG tablet Take 1 tablet (50 mg total) by mouth daily. Office visit needed for refills 03/11/19  Yes Rutherford Guys, MD  triamcinolone cream (KENALOG) 0.1 % Apply 1 application topically 2 (two) times daily. 09/11/18  Yes Posey Boyer, MD  venlafaxine (EFFEXOR) 75 MG tablet Take 1 tablet (75 mg total) by mouth daily. 04/15/19 04/14/20 Yes Arfeen, Arlyce Harman, MD  ciprofloxacin (CIPRO) 250 MG tablet Take 1 tablet (250 mg total) by mouth 2 (two) times daily. 04/15/19   Maximiano Coss, NP    Review of Systems: per HPI. Pertinent negative include fever, sweating, NVD, vaginal discharge or odor, gross hematuria, SOB, headache  PE:  BP 138/82   Pulse 91   Temp 97.9 F (36.6 C) (Oral)   Resp 16   Ht 5\' 4"  (1.626 m)   Wt 124 lb (56.2 kg)   SpO2 94%   BMI 21.28 kg/m  Constitutional: NAD, afebrile Heart: s1s2 normal, RRR Lungs: CTA bilat Back: no CVA tenderness  BLE edema, redness noted on exam. Pt reports is itchy. Has had in past, r/t varicose veins. States this is not unusual for her. Plans to followup with PCP [name] if it continues.   Results for orders placed or performed in visit on 04/15/19  POCT urinalysis dipstick  Result Value Ref Range   Color, UA yellow yellow   Clarity, UA cloudy (A) clear   Glucose, UA negative negative mg/dL   Bilirubin, UA negative negative   Ketones, POC UA negative negative mg/dL   Spec Grav, UA 1.015 1.010 - 1.025   Blood, UA trace-intact (A) negative   pH, UA 6.0 5.0 - 8.0   Protein Ur, POC negative negative mg/dL    Urobilinogen, UA 0.2 0.2 or 1.0 E.U./dL   Nitrite, UA Negative Negative   Leukocytes, UA Large (3+) (A) Negative    Assessment and Plan:   1. Dysuria - POCT urinalysis dipstick - Urine Culture - ciprofloxacin (CIPRO) 250 MG tablet; Take 1 tablet (250 mg total) by mouth 2 (two) times daily.  Dispense: 10 tablet; Refill: 0  2. Acute cystitis with hematuria   - Patient to follow up if symptoms worsen or fail to improve - Patient encouraged to call clinic with any questions, comments, or concerns.  I spent 12 minutes with this patient, more than 50% of which were spent counseling / education  Thank you for your visit with Primary Care at [facility] today.  Maximiano Coss, NP [facility] Primary Care at [place], [place] [address] [phone] - [phone]

## 2019-04-15 NOTE — Patient Instructions (Addendum)
If you have lab work done today you will be contacted with your lab results within the next 2 weeks.  If you have not heard from [name] then please contact us. The fastest way to get your results is to register for My Chart.   IF you received an x-ray today, you will receive an invoice from [facility]. Please contact [facility] at [phone] with questions or concerns regarding your invoice.   IF you received labwork today, you will receive an invoice from [facility]. Please contact [facility] at [phone] with questions or concerns regarding your invoice.   Our billing staff will not be able to assist you with questions regarding bills from these companies.  You will be contacted with the lab results as soon as they are available. The fastest way to get your results is to activate your My Chart account. Instructions are located on the last page of this paperwork. If you have not heard from [name] regarding the results in 2 weeks, please contact this office.       Urinary Tract Infection, Adult  A urinary tract infection (UTI) is an infection of any part of the urinary tract. The urinary tract includes the kidneys, ureters, bladder, and urethra. These organs make, store, and get rid of urine in the body. Your health care provider may use other names to describe the infection. An upper UTI affects the ureters and kidneys (pyelonephritis). A lower UTI affects the bladder (cystitis) and urethra (urethritis). What are the causes? Most urinary tract infections are caused by bacteria in your genital area, around the entrance to your urinary tract (urethra). These bacteria grow and cause inflammation of your urinary tract. What increases the risk? You are more likely to develop this condition if:  You have a urinary catheter that stays in place (indwelling).  You are not able to control when you urinate or have a bowel movement (you have incontinence).  You are female and  you: ? Use a spermicide or diaphragm for birth control. ? Have low estrogen levels. ? Are pregnant.  You have certain genes that increase your risk (genetics).  You are sexually active.  You take antibiotic medicines.  You have a condition that causes your flow of urine to slow down, such as: ? An enlarged prostate, if you are female. ? Blockage in your urethra (stricture). ? A kidney stone. ? A nerve condition that affects your bladder control (neurogenic bladder). ? Not getting enough to drink, or not urinating often.  You have certain medical conditions, such as: ? Diabetes. ? A weak disease-fighting system (immunesystem). ? Sickle cell disease. ? Gout. ? Spinal cord injury. What are the signs or symptoms? Symptoms of this condition include:  Needing to urinate right away (urgently).  Frequent urination or passing small amounts of urine frequently.  Pain or burning with urination.  Blood in the urine.  Urine that smells bad or unusual.  Trouble urinating.  Cloudy urine.  Vaginal discharge, if you are female.  Pain in the abdomen or the lower back. You may also have:  Vomiting or a decreased appetite.  Confusion.  Irritability or tiredness.  A fever.  Diarrhea. The first symptom in older adults may be confusion. In some cases, they may not have any symptoms until the infection has worsened. How is this diagnosed? This condition is diagnosed based on your medical history and a physical exam. You may also have other tests, including:  Urine tests.  Blood tests.  Tests for sexually transmitted infections (STIs). If you have had more than one UTI, a cystoscopy or imaging studies may be done to determine the cause of the infections. How is this treated? Treatment for this condition includes:  Antibiotic medicine.  Over-the-counter medicines to treat discomfort.  Drinking enough water to stay hydrated. If you have frequent infections or have other  conditions such as a kidney stone, you may need to see a health care provider who specializes in the urinary tract (urologist). In rare cases, urinary tract infections can cause sepsis. Sepsis is a life-threatening condition that occurs when the body responds to an infection. Sepsis is treated in the hospital with IV antibiotics, fluids, and other medicines. Follow these instructions at home:  Medicines  Take over-the-counter and prescription medicines only as told by your health care provider.  If you were prescribed an antibiotic medicine, take it as told by your health care provider. Do not stop using the antibiotic even if you start to feel better. General instructions  Make sure you: ? Empty your bladder often and completely. Do not hold urine for long periods of time. ? Empty your bladder after sex. ? Wipe from front to back after a bowel movement if you are female. Use each tissue one time when you wipe.  Drink enough fluid to keep your urine pale yellow.  Keep all follow-up visits as told by your health care provider. This is important. Contact a health care provider if:  Your symptoms do not get better after 1-2 days.  Your symptoms go away and then return. Get help right away if you have:  Severe pain in your back or your lower abdomen.  A fever.  Nausea or vomiting. Summary  A urinary tract infection (UTI) is an infection of any part of the urinary tract, which includes the kidneys, ureters, bladder, and urethra.  Most urinary tract infections are caused by bacteria in your genital area, around the entrance to your urinary tract (urethra).  Treatment for this condition often includes antibiotic medicines.  If you were prescribed an antibiotic medicine, take it as told by your health care provider. Do not stop using the antibiotic even if you start to feel better.  Keep all follow-up visits as told by your health care provider. This is important. This information  is not intended to replace advice given to you by your health care provider. Make sure you discuss any questions you have with your health care provider. Document Released: 08/27/2005 Document Revised: 05/27/2018 Document Reviewed: 05/27/2018 Elsevier Interactive Patient Education  2019 [employer] Inc.   Pyelonephritis, Adult Pyelonephritis is a kidney infection. The kidneys are the organs that filter a person's blood and move waste out of the bloodstream and into the urine. Urine passes from the kidneys, through the ureters, and into the bladder. There are two main types of pyelonephritis:  Infections that come on quickly without any warning (acute pyelonephritis).  Infections that last for a long period of time (chronic pyelonephritis). In most cases, the infection clears up with treatment and does not cause further problems. More severe infections or chronic infections can sometimes spread to the bloodstream or lead to other problems with the kidneys. What are the causes? This condition is usually caused by:  Bacteria traveling from the bladder to the kidney through infected urine. The urine in the bladder can become infected with bacteria from: ? Bladder infection (cystitis). ? Inflammation of the prostate gland (prostatitis). ? Sexual intercourse, in females.  Bacteria  traveling from the bloodstream to the kidney. What increases the risk? This condition is more likely to develop in:  Pregnant women.  Older people.  People who have diabetes.  People who have kidney stones or bladder stones.  People who have other abnormalities of the kidney or ureter.  People who have a catheter placed in the bladder.  People who have cancer.  People who are sexually active.  Women who use spermicides.  People who have had a prior urinary tract infection. What are the signs or symptoms? Symptoms of this condition include:  Frequent urination.  Strong or persistent urge to  urinate.  Burning or stinging when urinating.  Abdominal pain.  Back pain.  Pain in the side or flank area.  Fever.  Chills.  Blood in the urine, or dark urine.  Nausea.  Vomiting. How is this diagnosed? This condition may be diagnosed based on:  Medical history and physical exam.  Urine tests.  Blood tests. You may also have imaging tests of the kidneys, such as an ultrasound or CT scan. How is this treated? Treatment for this condition may depend on the severity of the infection.  If the infection is mild and is found early, you may be treated with antibiotic medicines taken by mouth. You will need to drink fluids to remain hydrated.  If the infection is more severe, you may need to stay in the hospital and receive antibiotics given directly into a vein through an IV tube. You may also need to receive fluids through an IV tube if you are not able to remain hydrated. After your hospital stay, you may need to take oral antibiotics for a period of time. Other treatments may be required, depending on the cause of the infection. Follow these instructions at home: Medicines  Take over-the-counter and prescription medicines only as told by your health care provider.  If you were prescribed an antibiotic medicine, take it as told by your health care provider. Do not stop taking the antibiotic even if you start to feel better. General instructions  Drink enough fluid to keep your urine clear or pale yellow.  Avoid caffeine, tea, and carbonated beverages. They tend to irritate the bladder.  Urinate often. Avoid holding in urine for long periods of time.  Urinate before and after sex.  After a bowel movement, women should cleanse from front to back. Use each tissue only once.  Keep all follow-up visits as told by your health care provider. This is important. Contact a health care provider if:  Your symptoms do not get better after 2 days of treatment.  Your symptoms  get worse.  You have a fever. Get help right away if:  You are unable to take your antibiotics or fluids.  You have shaking chills.  You vomit.  You have severe flank or back pain.  You have extreme weakness or fainting. This information is not intended to replace advice given to you by your health care provider. Make sure you discuss any questions you have with your health care provider. Document Released: 11/17/2005 Document Revised: 04/24/2016 Document Reviewed: 03/12/2015 Elsevier Interactive Patient Education  [employer].

## 2019-04-15 NOTE — Progress Notes (Signed)
Virtual Visit via Telephone Note  I connected with Catherine Kelley on 04/15/19 at 11:00 AM EDT by telephone and verified that I am speaking with the correct person using two identifiers.   I discussed the limitations, risks, security and privacy concerns of performing an evaluation and management service by telephone and the availability of in person appointments. I also discussed with the patient that there may be a patient responsible charge related to this service. The patient expressed understanding and agreed to proceed.   History of Present Illness: Patient was evaluated through phone session.  On her last visit we recommended to resume Effexor 37.5 mg and she is doing much better but feel that her medicine stopped working after few hours.  She endorsed highs and lows in her emotions.  She gets sometimes crying spells, racing thoughts and worried about health, finances and recently about COVID-19.  Few weeks ago she has fever and she was very scared but are not that she had pneumonia and not COVID-19.  Her daughter is also got sick.  She is very nervous about current situation.  She lives with her husband who is very supportive.  She has 2 children and 4 grandkids.  She does not want to be around grandkids as concerned about COVID-19.  She also noticed poor sleep, racing thoughts, severe anxiety.  She is taking nortriptyline, gabapentin, Effexor and Depakote.  In the past she has taken Depakote 1500 mg but it make her very sluggish.  She is tolerating her current medication and reported no side effects.  She does not feel that gabapentin helping and sometimes she skips the dose for the same reason.  Patient denies drinking or using any illegal substances.  She is not interested in therapy.  Past Psychiatric History: Reviewed. H/O depression, anxiety and bipolar disorder for more than 20 years.  Had tried Paxil, Prozac and Zoloft that cause headaches.  Took Xanax for many years however it was not renewed  by primary care physician when [name] office did not accept her insurance.  Did not recall any history of suicidal attempt or psychiatric inpatient treatment.    Observations/Objective: Mental status examination done on the phone.  Patient describes her mood anxious and nervous.  Her speech is fast but coherent.  Her thought processes circumstantial and she ruminates about her anxiety.  She gets sometimes distracted.  She is alert and oriented x3.  There were no delusions, paranoia or any suicidal thoughts.  Her fund of knowledge is fair.  Her cognition is intact.  She reported no tremors, shakes or any EPS.  Her insight and judgment is okay.  Assessment and Plan: Bipolar disorder type I.  Generalized anxiety disorder.  I discussed her medication.  Recommended to discontinue gabapentin since it is not helping her.  Recommend to try Depakote 750 mg at bedtime to help her insomnia, racing thoughts and manic symptoms.  The past she has taken up to 1500 mg but it make her sluggish.  I also recommend to try Effexor 75 mg in the morning to help her anxiety during the day.  Recommend to take nortriptyline 50 mg and if she cannot sleep then she take 100 mg.  One more time I offered therapy but patient not interested.  Recommended to call us back if she has any question, concern if she feels worsening of the symptom.  Follow-up in 2 months.  Follow Up Instructions:    I discussed the assessment and treatment plan with the  patient. The patient was provided an opportunity to ask questions and all were answered. The patient agreed with the plan and demonstrated an understanding of the instructions.   The patient was advised to call back or seek an in-person evaluation if the symptoms worsen or if the condition fails to improve as anticipated.  I provided 25 minutes of non-face-to-face time during this encounter.   Kathlee Nations, MD

## 2019-04-16 LAB — URINE CULTURE

## 2019-04-19 ENCOUNTER — Other Ambulatory Visit: Payer: Self-pay | Admitting: Family Medicine

## 2019-04-19 NOTE — Telephone Encounter (Signed)
Requested Prescriptions  Pending Prescriptions Disp Refills  . meloxicam (MOBIC) 15 MG tablet [Pharmacy Med Name: MELOXICAM 15 MG TABLET] 30 tablet 0    Sig: TAKE 1 TABLET BY MOUTH EVERY DAY     Analgesics:  COX2 Inhibitors Failed - 04/19/2019  1:10 AM      Failed - HGB in normal range and within 360 days    Hemoglobin  Date Value Ref Range Status  10/15/2018 16.3 (H) 11.1 - 15.9 g/dL Final         Passed - Cr in normal range and within 360 days    Creat  Date Value Ref Range Status  06/27/2016 0.45 (L) 0.50 - 0.99 mg/dL Final    Comment:      For patients > or = 69 years of age: The upper reference limit for Creatinine is approximately 13% higher for people identified as African-American.      Creatinine, Ser  Date Value Ref Range Status  10/15/2018 0.66 0.57 - 1.00 mg/dL Final         Passed - Patient is not pregnant      Passed - Valid encounter within last 12 months    Recent Outpatient Visits          4 days ago Dysuria   [facility], Delfino Lovett, NP   3 months ago Lower respiratory infection   Primary Care at [facility], Ines Bloomer, MD   3 months ago Cough   Primary Care at [facility], Ines Bloomer, MD   6 months ago Dizziness   [facility], Lilia Argue, MD   7 months ago Hand eczema   Primary Care at [facility], Fenton Malling, MD      Future Appointments            In 1 month Rutherford Guys, MD Primary Care at [place], [facility]

## 2019-04-19 NOTE — Telephone Encounter (Signed)
Is this okay to refill? 

## 2019-04-27 ENCOUNTER — Other Ambulatory Visit (HOSPITAL_COMMUNITY): Payer: Self-pay | Admitting: Psychiatry

## 2019-04-27 DIAGNOSIS — F411 Generalized anxiety disorder: Secondary | ICD-10-CM

## 2019-04-27 DIAGNOSIS — F319 Bipolar disorder, unspecified: Secondary | ICD-10-CM

## 2019-04-30 ENCOUNTER — Other Ambulatory Visit: Payer: Self-pay | Admitting: Family Medicine

## 2019-05-11 ENCOUNTER — Other Ambulatory Visit: Payer: Self-pay | Admitting: Family Medicine

## 2019-05-11 NOTE — Telephone Encounter (Signed)
Ok to refill 

## 2019-05-11 NOTE — Telephone Encounter (Signed)
Pt is requesting medication 

## 2019-05-19 ENCOUNTER — Other Ambulatory Visit: Payer: Self-pay | Admitting: Emergency Medicine

## 2019-05-19 DIAGNOSIS — R05 Cough: Secondary | ICD-10-CM

## 2019-05-19 DIAGNOSIS — R059 Cough, unspecified: Secondary | ICD-10-CM

## 2019-05-20 ENCOUNTER — Other Ambulatory Visit: Payer: Self-pay | Admitting: Family Medicine

## 2019-05-20 NOTE — Telephone Encounter (Signed)
Forwarding medication refill to PCP for review. 

## 2019-05-23 ENCOUNTER — Ambulatory Visit: Payer: Self-pay | Admitting: *Deleted

## 2019-05-23 DIAGNOSIS — R3915 Urgency of urination: Secondary | ICD-10-CM | POA: Diagnosis not present

## 2019-05-23 DIAGNOSIS — M25511 Pain in right shoulder: Secondary | ICD-10-CM | POA: Diagnosis not present

## 2019-05-23 DIAGNOSIS — R42 Dizziness and giddiness: Secondary | ICD-10-CM | POA: Diagnosis not present

## 2019-05-23 NOTE — Telephone Encounter (Signed)
Pt called with complaints of falling due to dizziness the pm of 05/22/2019; she fell on her back, and the back of her head; the pt says that her pupils are equal; she also complains of having a UTI ans she can not hold her urine; this morning the pt complains of back pain below the shoulder; her UTI symptoms are the worse  Reason for Disposition . [1] Can't control passage of urine (i.e., urinary incontinence) AND [2] new onset (< 2 weeks) or worsening  Answer Assessment - Initial Assessment Questions 1. SYMPTOM: "What's the main symptom you're concerned about?" (e.g., frequency, incontinence)     burning 2. ONSET: "When did the  start?"     05/21/2019 3. PAIN: "Is there any pain?" If so, ask: "How bad is it?" (Scale: 1-10; mild, moderate, severe)     9 out of 10 4. CAUSE: "What do you think is causing the symptoms?"     UTI 5. OTHER SYMPTOMS: "Do you have any other symptoms?" (e.g., fever, flank pain, blood in urine, pain with urination)     Frequency, urgency 6. PREGNANCY: "Is there any chance you are pregnant?" "When was your last menstrual period?"     no  Protocols used: URINARY [facility]

## 2019-05-29 ENCOUNTER — Other Ambulatory Visit: Payer: Self-pay | Admitting: Family Medicine

## 2019-05-29 NOTE — Telephone Encounter (Signed)
Requested Prescriptions  Pending Prescriptions Disp Refills  . meloxicam (MOBIC) 15 MG tablet [Pharmacy Med Name: MELOXICAM 15 MG TABLET] 30 tablet 0    Sig: TAKE 1 TABLET BY MOUTH EVERY DAY     Analgesics:  COX2 Inhibitors Failed - 05/29/2019 12:52 AM      Failed - HGB in normal range and within 360 days    Hemoglobin  Date Value Ref Range Status  10/15/2018 16.3 (H) 11.1 - 15.9 g/dL Final         Passed - Cr in normal range and within 360 days    Creat  Date Value Ref Range Status  06/27/2016 0.45 (L) 0.50 - 0.99 mg/dL Final    Comment:      For patients > or = 70 years of age: The upper reference limit for Creatinine is approximately 13% higher for people identified as African-American.      Creatinine, Ser  Date Value Ref Range Status  10/15/2018 0.66 0.57 - 1.00 mg/dL Final         Passed - Patient is not pregnant      Passed - Valid encounter within last 12 months    Recent Outpatient Visits          1 month ago Dysuria   [facility], Delfino Lovett, NP   4 months ago Lower respiratory infection   Primary Care at [facility], Ines Bloomer, MD   5 months ago Cough   Primary Care at [facility], Ines Bloomer, MD   7 months ago Dizziness   [facility], Lilia Argue, MD   8 months ago Hand eczema   Primary Care at [facility], Fenton Malling, MD      Future Appointments            In 1 week Rutherford Guys, MD Primary Care at [place], [facility]   In 1 month Turner, Eber Hong, MD [facility], [facility]

## 2019-06-03 DIAGNOSIS — A499 Bacterial infection, unspecified: Secondary | ICD-10-CM | POA: Diagnosis not present

## 2019-06-03 DIAGNOSIS — R300 Dysuria: Secondary | ICD-10-CM | POA: Diagnosis not present

## 2019-06-03 DIAGNOSIS — N390 Urinary tract infection, site not specified: Secondary | ICD-10-CM | POA: Diagnosis not present

## 2019-06-03 DIAGNOSIS — R82998 Other abnormal findings in urine: Secondary | ICD-10-CM | POA: Diagnosis not present

## 2019-06-10 ENCOUNTER — Other Ambulatory Visit (HOSPITAL_COMMUNITY): Payer: Self-pay | Admitting: Psychiatry

## 2019-06-10 ENCOUNTER — Ambulatory Visit: Payer: Self-pay | Admitting: Family Medicine

## 2019-06-10 DIAGNOSIS — F411 Generalized anxiety disorder: Secondary | ICD-10-CM

## 2019-06-20 ENCOUNTER — Other Ambulatory Visit: Payer: Self-pay | Admitting: Family Medicine

## 2019-06-24 ENCOUNTER — Ambulatory Visit (HOSPITAL_COMMUNITY): Payer: Medicare Other | Admitting: Psychiatry

## 2019-07-01 ENCOUNTER — Ambulatory Visit: Payer: Medicare Other | Admitting: Cardiology

## 2019-07-12 ENCOUNTER — Ambulatory Visit: Payer: Self-pay

## 2019-07-12 NOTE — Telephone Encounter (Signed)
Summary: Return call   Pt would like to speak with a nurse regarding asthma and frequent urination. Please advise.      Triaged Patient for UTI discomfort.

## 2019-07-12 NOTE — Telephone Encounter (Signed)
Incoming call from Patient who complains of urgency and frequency , complains of wheezing also. Onset was 3 days ago, [place].  Pain occurs when finishing  urinating and standing up.  Attempted to call [place] office no answer.  Patient request an return phone call Awaits a return call to schedule  an in person appointment. Available any time Friday. Please call Patient.         Reason for Disposition . Urinating more frequently than usual (i.e., frequency)  Answer Assessment - Initial Assessment Questions 1. SYMPTOM: "What's the main symptom you're concerned about?" (e.g., frequency, incontinence)     Urgency hurts  Asthma wheeze inhaler  2. ONSET: "When did the  *No Answer*  start?"     uti stared 3 days ago.  2 days ago 3. PAIN: "Is there any pain?" If so, ask: "How bad is it?" (Scale: 1-10; mild, moderate, severe)     If standing rated around 7 4. CAUSE: "What do you think is causing the symptoms?"      5. OTHER SYMPTOMS: "Do you have any other symptoms?" (e.g., fever, flank pain, blood in urine, pain with urination)    Pain with urination.  6. PREGNANCY: "Is there any chance you are pregnant?" "When was your last menstrual period?"     na  Protocols used: URINARY [facility]

## 2019-07-15 ENCOUNTER — Encounter: Payer: Self-pay | Admitting: Registered Nurse

## 2019-07-15 ENCOUNTER — Other Ambulatory Visit: Payer: Self-pay | Admitting: *Deleted

## 2019-07-15 ENCOUNTER — Other Ambulatory Visit: Payer: Self-pay

## 2019-07-15 ENCOUNTER — Ambulatory Visit (INDEPENDENT_AMBULATORY_CARE_PROVIDER_SITE_OTHER): Payer: Medicare Other | Admitting: Registered Nurse

## 2019-07-15 VITALS — BP 140/80 | HR 94 | Temp 98.0°F | Resp 16 | Ht 62.21 in | Wt 117.0 lb

## 2019-07-15 DIAGNOSIS — J449 Chronic obstructive pulmonary disease, unspecified: Secondary | ICD-10-CM

## 2019-07-15 DIAGNOSIS — R350 Frequency of micturition: Secondary | ICD-10-CM

## 2019-07-15 DIAGNOSIS — M25512 Pain in left shoulder: Secondary | ICD-10-CM

## 2019-07-15 LAB — POCT URINALYSIS DIP (MANUAL ENTRY)
Bilirubin, UA: NEGATIVE
Blood, UA: NEGATIVE
Glucose, UA: NEGATIVE mg/dL
Nitrite, UA: POSITIVE — AB
Protein Ur, POC: NEGATIVE mg/dL
Spec Grav, UA: 1.015 (ref 1.010–1.025)
Urobilinogen, UA: 1 E.U./dL
pH, UA: 7 (ref 5.0–8.0)

## 2019-07-15 MED ORDER — FLUTICASONE PROPIONATE 50 MCG/ACT NA SUSP: 2.0000 | Freq: Every day | NASAL | 6 refills | Status: DC

## 2019-07-15 MED ORDER — CIPROFLOXACIN HCL 250 MG PO TABS: 250.0000 mg | ORAL_TABLET | Freq: Two times a day (BID) | ORAL | 0 refills | Status: DC

## 2019-07-15 NOTE — Patient Instructions (Signed)
° ° ° °  If you have lab work done today you will be contacted with your lab results within the next 2 weeks.  If you have not heard from us then please contact us. The fastest way to get your results is to register for My Chart. ° ° °IF you received an x-ray today, you will receive an invoice from [facility]. Please contact [facility] at [phone] with questions or concerns regarding your invoice.  ° °IF you received labwork today, you will receive an invoice from [facility]. Please contact [facility] at [phone] with questions or concerns regarding your invoice.  ° °Our billing staff will not be able to assist you with questions regarding bills from these companies. ° °You will be contacted with the lab results as soon as they are available. The fastest way to get your results is to activate your My Chart account. Instructions are located on the last page of this paperwork. If you have not heard from us regarding the results in 2 weeks, please contact this office. °  ° ° ° °

## 2019-07-15 NOTE — Progress Notes (Signed)
Established Patient Office Visit  Subjective:  Patient ID: Catherine Kelley, female    DOB: 10-21-1951  Age: 68 y.o. MRN: [id number]  CC:  Chief Complaint  Patient presents with  . Urinary Frequency    with some pain and burning   . Foot Swelling    both feet with redness and pain   . Muscle Pain    right shoulder     HPI Catherine Kelley presents for 3 mo follow up, urinary symptoms, shoulder pain  3 mo follow up: feels well overall, breathing well but with some wheezing. Using albuterol inhaler as needed, has not needed nebulizer. Requests refill of fluticasone.   Urinary symptoms: Has frequent UTIs - this feels similar. Frequency, urgency. Some suprapubic discomfort. States she passed urine 4 times in 1 hour last night. Unfortunately, she gets UTIs as often as 4 times each year.  Shoulder pain with insidious onset over past few weeks to months, located in L deltoid. Has full ROM, but feels like shoulder "locks up" sometimes. Does not recall injury.    Past Medical History:  Diagnosis Date  . Anxiety   . Arthritis    "jooints" (09/18/2017)  . Asthma   . Bipolar disorder ([place])   . CAP (community acquired pneumonia) 09/18/2017  . Cat allergies   . Chronic bronchitis ([place])    "get it alot; maybe not q yr" (07/06/2014)  . Chronic lower back pain    "spurs and pinched nerves" (09/18/2017)  . COPD (chronic obstructive pulmonary disease) ([place])   . DDD (degenerative disc disease), lumbar   . Depression    psychiatrist Dr. Toy Care  . Diastolic dysfunction   . Emphysematous COPD ([place])    changes in right base along with patchy areas on last x-ray  . Falls frequently    "several in 2017; fell at dr's office yesterday" (09/18/2017)  . GERD (gastroesophageal reflux disease)   . HCAP (healthcare-associated pneumonia) 02/12/2016  . Heart murmur   . History of cardiovascular stress test 08/15/2004   EF of 70% -- Normal stress cardiolite.  There is no evidence of ischemia and there is normal LV  function. -- [name]. MD  . History of echocardiogram 12/24/2006   Est. EF of 29-79% NORMAL LV SYSTOLIC FUNCTION WITH IMPAIRED RELAXATION -- MILD AORTIC SCLEROSIS -- NORMAL PALONARY ARTERY PRESSURE -- NO OLD ECHOS FOR COMPARISON -- [name], MD  . Hypertension    essential hypertension  . Migraine    "when I was having my periods; none since then" (09/18/2017)  . Necrotic pneumonia ([place])    recurrent/notes 02/12/2016  . Pneumonia    "several times since May 2015" (07/06/2014)  . Pollen allergies   . Tobacco abuse    smoking about 1/2 pk of cigarettes a day    Past Surgical History:  Procedure Laterality Date  . CAROTID STENT    . DILATION AND CURETTAGE OF UTERUS    . TUBAL LIGATION  1986    Family History  Problem Relation Age of Onset  . Stroke Mother   . Heart disease Father   . Hyperlipidemia Father   . Hypertension Father   . Breast cancer Maternal Aunt   . Asthma Maternal Grandmother     Social History   Socioeconomic History  . Marital status: Married    Spouse name: Catherine Kelley  . Number of children: 2  . Years of education: Not on file  . Highest education level: Associate degree: occupational, technical, or  vocational program  Occupational History  . Occupation: Chemical engineer: [employer]    Comment: [employer]  Social Needs  . Financial resource strain: Somewhat hard  . Food insecurity    Worry: Sometimes true    Inability: Sometimes true  . Transportation needs    Medical: No    Non-medical: No  Tobacco Use  . Smoking status: Current Every Day Smoker    Packs/day: 0.50    Years: 46.00    Pack years: 23.00    Types: Cigarettes  . Smokeless tobacco: Never Used  Substance and Sexual Activity  . Alcohol use: Yes    Alcohol/week: 0.0 standard drinks    Comment: 09/18/2017 "might have 1 drink/month; if that"  . Drug use: No  . Sexual activity: Not Currently  Lifestyle  . Physical activity    Days per week: 0 days    Minutes  per session: 0 min  . Stress: Very much  Relationships  . Social Herbalist on phone: Not on file    Gets together: Not on file    Attends religious service: More than 4 times per year    Active member of club or organization: No    Attends meetings of clubs or organizations: Never    Relationship status: Married  . Intimate partner violence    Fear of current or ex partner: No    Emotionally abused: No    Physically abused: No    Forced sexual activity: No  Other Topics Concern  . Not on file  Social History Narrative   Patient currently lives with her husband.    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 No bird or hot tub exposure. No mold exposure. No recent travel.    Outpatient Medications Prior to Visit  Medication Sig Dispense Refill  . aspirin EC 81 MG tablet Take 81-162 mg by mouth See admin instructions. 81 mg once a day "for heart health" and 162 mg one to two times a day as needed for headaches    . budesonide-formoterol (SYMBICORT) 160-4.5 MCG/ACT inhaler Inhale 2 puffs into the lungs 2 (two) times daily. 1 Inhaler 12  . calcium carbonate (TUMS EX) 750 MG chewable tablet Chew 1 tablet by mouth 2 (two) times daily as needed for heartburn.    . divalproex (DEPAKOTE ER) 250 MG 24 hr tablet Take 3 tablets (750 mg total) by mouth at bedtime. 90 tablet 2  . fluticasone (FLONASE) 50 MCG/ACT nasal spray Place 2 sprays into both nostrils daily. 16 g 6  . HYDROcodone-acetaminophen (NORCO/VICODIN) 5-325 MG tablet Take 1 tablet by mouth every 6 (six) hours as needed for moderate pain. 20 tablet 0  . meloxicam (MOBIC) 15 MG tablet TAKE 1 TABLET BY MOUTH EVERY DAY 30 tablet 0  . methocarbamol (ROBAXIN) 500 MG tablet TAKE 1 TABLET (500 MG TOTAL) BY MOUTH EVERY 8 (EIGHT) HOURS AS NEEDED FOR MUSCLE SPASMS. 30 tablet 1  . nortriptyline (PAMELOR) 50 MG capsule TAKE ONE TO TWO CAPSULES BY  MOUTH NIGHTLY 180 capsule 0  . spironolactone (ALDACTONE) 50 MG tablet Take 1 tablet (50 mg total) by mouth daily. Office visit needed for refills 90 tablet 1  . triamcinolone cream (KENALOG) 0.1 % Apply 1 application topically 2 (two) times daily. 30 g 1  . venlafaxine (EFFEXOR) 75 MG tablet Take  1 tablet (75 mg total) by mouth daily. 90 tablet 0  . VENTOLIN HFA 108 (90 Base) MCG/ACT inhaler TAKE 2 PUFFS BY MOUTH EVERY 6 HOURS AS NEEDED FOR WHEEZE OR SHORTNESS OF BREATH 18 g 5  . ciprofloxacin (CIPRO) 250 MG tablet Take 1 tablet (250 mg total) by mouth 2 (two) times daily. 10 tablet 0   No facility-administered medications prior to visit.     Allergies  Allergen Reactions  . Sulfa Drugs Cross Reactors Hives and Rash    Hives and rash  . Ceclor [Cefaclor] Hives    Tolerated ceftazidime December 2016  . Depakote Er [Divalproex Sodium Er] Other (See Comments)    Patient remarked that when she was taking this TWICE a day, she became "clumsy" and felt more prone to stumbling  . Escitalopram Oxalate Other (See Comments)    Pt does not recall ever taking medication (??)  . Sertraline Hcl     Severe headache    ROS Review of Systems  Constitutional: Negative.  Negative for chills and fever.  HENT: Negative.   Eyes: Negative.   Respiratory: Positive for wheezing. Negative for cough, chest tightness and shortness of breath.   Cardiovascular: Negative.   Gastrointestinal: Negative.   Endocrine: Negative.   Genitourinary: Positive for dysuria, frequency and urgency. Negative for hematuria.  Musculoskeletal: Positive for arthralgias and myalgias.  Skin: Negative.   Allergic/Immunologic: Negative.   Neurological: Negative.   Hematological: Negative.   Psychiatric/Behavioral: Negative.   All other systems reviewed and are negative.     Objective:    Physical Exam  Constitutional: She is oriented to person, place, and time. She appears well-developed and well-nourished. No distress.   Cardiovascular: Normal rate and regular rhythm.  Pulmonary/Chest: Effort normal. No respiratory distress. She has wheezes.  Abdominal: Soft. Bowel sounds are normal.  Musculoskeletal: Normal range of motion.        General: Tenderness (L deltoid, some L shoulder joint line pain) present. No deformity or edema.  Neurological: She is alert and oriented to person, place, and time. No cranial nerve deficit.  Skin: Skin is warm and dry. No rash noted. She is not diaphoretic. No erythema. No pallor.  Psychiatric: She has a normal mood and affect. Her behavior is normal. Judgment and thought content normal.  Nursing note and vitals reviewed.   BP 140/80   Pulse 94   Temp 98 F (36.7 C) (Oral)   Resp 16   Ht 5' 2.21" (1.58 m)   Wt 117 lb (53.1 kg)   SpO2 96%   BMI 21.26 kg/m  Wt Readings from Last 3 Encounters:  07/15/19 117 lb (53.1 kg)  04/15/19 124 lb (56.2 kg)  12/31/18 118 lb 9.6 oz (53.8 kg)     Health Maintenance Due  Topic Date Due  . MAMMOGRAM  04/22/2019  . INFLUENZA VACCINE  07/02/2019    There are no preventive care reminders to display for this patient.  Lab Results  Component Value Date   TSH 2.070 10/15/2018   Lab Results  Component Value Date   WBC 4.0 10/15/2018   HGB 16.3 (H) 10/15/2018   HCT 48.4 (H) 10/15/2018   MCV 87 10/15/2018   PLT 86 (LL) 10/15/2018   Lab Results  Component Value Date   NA 137 10/15/2018   K 4.1 10/15/2018   CO2 26 10/15/2018   GLUCOSE 83 10/15/2018   BUN 8 10/15/2018   CREATININE 0.66 10/15/2018   BILITOT 0.2 10/15/2018   ALKPHOS  144 (H) 10/15/2018   AST 23 10/15/2018   ALT 19 10/15/2018   PROT 7.3 10/15/2018   ALBUMIN 3.9 10/15/2018   CALCIUM 9.1 10/15/2018   ANIONGAP 9 09/18/2017   GFR 186.01 02/04/2016   No results found for: CHOL No results found for: HDL No results found for: LDLCALC No results found for: TRIG No results found for: CHOLHDL Lab Results  Component Value Date   HGBA1C 5.2 07/20/2017       Assessment & Plan:   Problem List Items Addressed This Visit      Respiratory   Stage 2 moderate COPD by GOLD classification ([place])   Relevant Orders   Comprehensive metabolic panel   CBC    Other Visit Diagnoses    Frequent urination    -  Primary   Relevant Medications   ciprofloxacin (CIPRO) 250 MG tablet   Other Relevant Orders   POCT urinalysis dipstick (Completed)   Urine Culture   Ambulatory referral to Urology   Left shoulder pain, unspecified chronicity       Relevant Orders   Ambulatory referral to Physical Therapy      Meds ordered this encounter  Medications  . ciprofloxacin (CIPRO) 250 MG tablet    Sig: Take 1 tablet (250 mg total) by mouth 2 (two) times daily.    Dispense:  10 tablet    Refill:  0    Order Specific Question:   Supervising Provider    Answer:   Forrest Moron [id number]    Follow-up: No follow-ups on file.   PLAN  Will treat UTI with cipro, given patient's intolerances and other medications, however she will also be referred to Urology for further workup and management given the frequency of her UTIs, abx she doesn't tolerate, and other conditions like COPD that would make antibiotic resistance a particular concern in this patient.  Will refer to PT for shoulder pain - seems largely in deltoid vs. Actual joint, has full active ROM in office today. Flexion or abduction above shoulder height elicits deltoid pain rather than intraarticular pain.   Will plan to have her see PCP [name] in around 3 months.  Patient encouraged to call clinic with any questions, comments, or concerns.   Maximiano Coss, NP

## 2019-07-15 NOTE — Telephone Encounter (Signed)
Has an appointment today for 11am with [facility]. Will address at visit

## 2019-07-16 LAB — COMPREHENSIVE METABOLIC PANEL
ALT: 11 IU/L (ref 0–32)
AST: 17 IU/L (ref 0–40)
Albumin/Globulin Ratio: 1.4 (ref 1.2–2.2)
Albumin: 4.1 g/dL (ref 3.8–4.8)
Alkaline Phosphatase: 128 IU/L — ABNORMAL HIGH (ref 39–117)
BUN/Creatinine Ratio: 15 (ref 12–28)
BUN: 9 mg/dL (ref 8–27)
Bilirubin Total: 0.4 mg/dL (ref 0.0–1.2)
CO2: 22 mmol/L (ref 20–29)
Calcium: 8.9 mg/dL (ref 8.7–10.3)
Chloride: 99 mmol/L (ref 96–106)
Creatinine, Ser: 0.59 mg/dL (ref 0.57–1.00)
GFR calc Af Amer: 109 mL/min/{1.73_m2} (ref >59)
GFR calc non Af Amer: 94 mL/min/{1.73_m2} (ref >59)
Globulin, Total: 2.9 g/dL (ref 1.5–4.5)
Glucose: 111 mg/dL — ABNORMAL HIGH (ref 65–99)
Potassium: 4 mmol/L (ref 3.5–5.2)
Sodium: 135 mmol/L (ref 134–144)
Total Protein: 7 g/dL (ref 6.0–8.5)

## 2019-07-16 LAB — CBC
Hematocrit: 40.6 % (ref 34.0–46.6)
Hemoglobin: 13.7 g/dL (ref 11.1–15.9)
MCH: 29.5 pg (ref 26.6–33.0)
MCHC: 33.7 g/dL (ref 31.5–35.7)
MCV: 87 fL (ref 79–97)
Platelets: 98 10*3/uL — CL (ref 150–450)
RBC: 4.65 x10E6/uL (ref 3.77–5.28)
RDW: 13.3 % (ref 11.7–15.4)
WBC: 4.7 10*3/uL (ref 3.4–10.8)

## 2019-07-17 LAB — URINE CULTURE

## 2019-07-18 DIAGNOSIS — R350 Frequency of micturition: Secondary | ICD-10-CM | POA: Diagnosis not present

## 2019-07-18 DIAGNOSIS — J449 Chronic obstructive pulmonary disease, unspecified: Secondary | ICD-10-CM | POA: Diagnosis not present

## 2019-07-18 DIAGNOSIS — J069 Acute upper respiratory infection, unspecified: Secondary | ICD-10-CM | POA: Diagnosis not present

## 2019-07-18 NOTE — Progress Notes (Signed)
Good morning [name], I recently had the pleasure of seeing your patient, Catherine Kelley, for UTI symptoms. Given the frequency with which she gets UTIs, we discussed a referral to Urology. However, we ran some additional labs for unrelated reasons, and we found continued low platelets, though not out of line with what she's had in the past. I wanted to make you aware of this lab. Thank you,  Kathrin Ruddy, NP

## 2019-07-19 ENCOUNTER — Telehealth: Payer: Self-pay

## 2019-07-19 NOTE — Telephone Encounter (Signed)
[facility] called into the office critical labs  For this pt, Platelets 98.

## 2019-07-19 NOTE — Telephone Encounter (Signed)
Ok, this has been somewhat in line with what she has had in the past. I have Cc'd her PCP [name] on this message to keep her apprised of the situation. Thank you, Kathrin Ruddy, NP

## 2019-07-20 ENCOUNTER — Telehealth: Payer: Self-pay | Admitting: Family Medicine

## 2019-07-20 NOTE — Telephone Encounter (Signed)
Pt is wanting a cb concerning her lab results. Please advise

## 2019-07-21 NOTE — Telephone Encounter (Signed)
Noted will discuss at next OV

## 2019-07-22 ENCOUNTER — Telehealth: Payer: Medicare Other | Admitting: Family Medicine

## 2019-07-22 DIAGNOSIS — J069 Acute upper respiratory infection, unspecified: Secondary | ICD-10-CM | POA: Diagnosis not present

## 2019-07-22 DIAGNOSIS — A499 Bacterial infection, unspecified: Secondary | ICD-10-CM | POA: Diagnosis not present

## 2019-07-22 DIAGNOSIS — N390 Urinary tract infection, site not specified: Secondary | ICD-10-CM | POA: Diagnosis not present

## 2019-07-22 NOTE — Telephone Encounter (Signed)
Called pt and informed her that her platelets are low and the provider wants to see her face to face to talk about what that means and what to do next.   Pt stated understanding and appointment set up for Friday 07/29/19 at 2:40 pm

## 2019-07-24 ENCOUNTER — Other Ambulatory Visit: Payer: Self-pay | Admitting: Family Medicine

## 2019-07-25 ENCOUNTER — Telehealth: Payer: Self-pay | Admitting: *Deleted

## 2019-07-26 ENCOUNTER — Ambulatory Visit: Payer: Self-pay

## 2019-07-26 ENCOUNTER — Telehealth: Payer: Medicare Other | Admitting: Family Medicine

## 2019-07-27 ENCOUNTER — Ambulatory Visit (INDEPENDENT_AMBULATORY_CARE_PROVIDER_SITE_OTHER): Payer: Medicare Other | Admitting: Family Medicine

## 2019-07-27 ENCOUNTER — Telehealth: Payer: Self-pay | Admitting: *Deleted

## 2019-07-27 ENCOUNTER — Other Ambulatory Visit: Payer: Self-pay | Admitting: Family Medicine

## 2019-07-27 VITALS — BP 140/80 | Ht 62.0 in | Wt 117.0 lb

## 2019-07-27 DIAGNOSIS — Z0000 Encounter for general adult medical examination without abnormal findings: Secondary | ICD-10-CM

## 2019-07-27 NOTE — Patient Instructions (Signed)
Thank you for taking time to come for your Medicare Wellness Visit. I appreciate your ongoing commitment to your health goals. Please review the following plan we discussed and let me know if I can assist you in the future.    LPN  Preventive Care 69 Years and Older, Female Preventive care refers to lifestyle choices and visits with your health care provider that can promote health and wellness. This includes:  A yearly physical exam. This is also called an annual well check.  Regular dental and eye exams.  Immunizations.  Screening for certain conditions.  Healthy lifestyle choices, such as diet and exercise. What can I expect for my preventive care visit? Physical exam Your health care provider will check:  Height and weight. These may be used to calculate body mass index (BMI), which is a measurement that tells if you are at a healthy weight.  Heart rate and blood pressure.  Your skin for abnormal spots. Counseling Your health care provider may ask you questions about:  Alcohol, tobacco, and drug use.  Emotional well-being.  Home and relationship well-being.  Sexual activity.  Eating habits.  History of falls.  Memory and ability to understand (cognition).  Work and work environment.  Pregnancy and menstrual history. What immunizations do I need?  Influenza (flu) vaccine  This is recommended every year. Tetanus, diphtheria, and pertussis (Tdap) vaccine  You may need a Td booster every 10 years. Varicella (chickenpox) vaccine  You may need this vaccine if you have not already been vaccinated. Zoster (shingles) vaccine  You may need this after age 60. Pneumococcal conjugate (PCV13) vaccine  One dose is recommended after age 69. Pneumococcal polysaccharide (PPSV23) vaccine  One dose is recommended after age 69. Measles, mumps, and rubella (MMR) vaccine  You may need at least one dose of MMR if you were born in 1957 or later. You may also  need a second dose. Meningococcal conjugate (MenACWY) vaccine  You may need this if you have certain conditions. Hepatitis A vaccine  You may need this if you have certain conditions or if you travel or work in places where you may be exposed to hepatitis A. Hepatitis B vaccine  You may need this if you have certain conditions or if you travel or work in places where you may be exposed to hepatitis B. Haemophilus influenzae type b (Hib) vaccine  You may need this if you have certain conditions. You may receive vaccines as individual doses or as more than one vaccine together in one shot (combination vaccines). Talk with your health care provider about the risks and benefits of combination vaccines. What tests do I need? Blood tests  Lipid and cholesterol levels. These may be checked every 5 years, or more frequently depending on your overall health.  Hepatitis C test.  Hepatitis B test. Screening  Lung cancer screening. You may have this screening every year starting at age 69 if you have a 30-pack-year history of smoking and currently smoke or have quit within the past 15 years.  Colorectal cancer screening. All adults should have this screening starting at age 50 and continuing until age 75. Your health care provider may recommend screening at age 45 if you are at increased risk. You will have tests every 1-10 years, depending on your results and the type of screening test.  Diabetes screening. This is done by checking your blood sugar (glucose) after you have not eaten for a while (fasting). You may have this done every 1-3   years.  Mammogram. This may be done every 1-2 years. Talk with your health care provider about how often you should have regular mammograms.  BRCA-related cancer screening. This may be done if you have a family history of breast, ovarian, tubal, or peritoneal cancers. Other tests  Sexually transmitted disease (STD) testing.  Bone density scan. This is done  to screen for osteoporosis. You may have this done starting at age 69. Follow these instructions at home: Eating and drinking  Eat a diet that includes fresh fruits and vegetables, whole grains, lean protein, and low-fat dairy products. Limit your intake of foods with high amounts of sugar, saturated fats, and salt.  Take vitamin and mineral supplements as recommended by your health care provider.  Do not drink alcohol if your health care provider tells you not to drink.  If you drink alcohol: ? Limit how much you have to 0-1 drink a day. ? Be aware of how much alcohol is in your drink. In the [place] one drink equals one 12 oz bottle of beer (355 mL), one 5 oz glass of wine (148 mL), or one 1 oz glass of hard liquor (44 mL). Lifestyle  Take daily care of your teeth and gums.  Stay active. Exercise for at least 30 minutes on 5 or more days each week.  Do not use any products that contain nicotine or tobacco, such as cigarettes, e-cigarettes, and chewing tobacco. If you need help quitting, ask your health care provider.  If you are sexually active, practice safe sex. Use a condom or other form of protection in order to prevent STIs (sexually transmitted infections).  Talk with your health care provider about taking a low-dose aspirin or statin. What's next?  Go to your health care provider once a year for a well check visit.  Ask your health care provider how often you should have your eyes and teeth checked.  Stay up to date on all vaccines. This information is not intended to replace advice given to you by your health care provider. Make sure you discuss any questions you have with your health care provider. Document Released: 12/14/2015 Document Revised: 11/11/2018 Document Reviewed: 11/11/2018 Elsevier Patient Education  2020 Elsevier Inc.  

## 2019-07-27 NOTE — Progress Notes (Signed)
Presents today for [employer] Visit   Date of last exam: 07/15/2019  Interpreter used for this visit?  No  I connected with  Catherine Kelley on 07/27/19 by a telephone  application and verified that I am speaking with the correct person using two identifiers.  .    Patient Care Team: Rutherford Guys, MD as PCP - General (Family Medicine) Cooper Render, NP as Nurse Practitioner (Nurse Practitioner) Nahser, Wonda Cheng, MD as Consulting Physician (Cardiology)   Other items to address today:  Discussed immunizations Discussed Eye/Dental    ADVANCE DIRECTIVES: Discussed: yes On File: no Materials Provided: yes (mailed)  Immunization status:  Immunization History  Administered Date(s) Administered  . Influenza Split 09/29/2012, 08/31/2013  . Influenza, High Dose Seasonal PF 08/30/2017, 09/11/2018  . Influenza,inj,Quad PF,6+ Mos 09/06/2014, 09/28/2014, 10/10/2015, 09/22/2016  . Influenza-Unspecified 10/01/2017  . Pneumococcal Conjugate-13 12/13/2014  . Pneumococcal Polysaccharide-23 08/31/2013, 11/02/2015  . Tdap 07/08/2015     Health Maintenance Due  Topic Date Due  . INFLUENZA VACCINE  07/02/2019     Functional Status Survey: Is the patient deaf or have difficulty hearing?: No Does the patient have difficulty seeing, even when wearing glasses/contacts?: No Does the patient have difficulty concentrating, remembering, or making decisions?: No Does the patient have difficulty walking or climbing stairs?: No Does the patient have difficulty dressing or bathing?: No Does the patient have difficulty doing errands alone such as visiting a doctor's office or shopping?: No   6CIT Screen 07/27/2019  What Year? 0 points  What month? 0 points  What time? 0 points  Count back from 20 0 points  Months in reverse 0 points  Repeat phrase 0 points  Total Score 0        Clinical Support from 07/27/2019 in [facility] at [facility]  AUDIT-C Score  1        Home Environment:   Live in a one story home  No trouble climbing stairs No scattered rugs Yes grab bars Adequate lighting/no clutter   Patient Active Problem List   Diagnosis Date Noted  . Lower respiratory infection 12/22/2018  . Senile ecchymosis 09/11/2018  . CAP (community acquired pneumonia) 09/17/2017  . Near syncope 09/17/2017  . Tobacco abuse 02/12/2016  . Other fatigue 02/04/2016  . Respiratory failure with hypoxia ([place]) 01/09/2016  . Malnutrition of moderate degree 11/03/2015  . Pulmonary emphysema ([place])   . Essential hypertension 09/27/2015  . Rhabdomyolysis 08/16/2015  . Cough 06/07/2015  . Bronchiectasis with acute exacerbation ([place]) 03/07/2015  . Cigarette smoker 12/13/2014  . Stage 2 moderate COPD by GOLD classification ([place]) 10/11/2014  . Lumbar back pain with radiculopathy affecting right lower extremity 09/28/2014  . Hyponatremia 07/09/2014  . Hx of recurrent pneumonia 07/08/2014  . Venous insufficiency (chronic) (peripheral) 09/29/2012  . Lower extremity edema 03/09/2012  . Benign hypertensive heart disease without heart failure 05/09/2011  . Chest pain 05/09/2011  . Dyslipidemia 05/09/2011  . Mood disorder ([place]) 05/09/2011  . Osteoarthritis 05/09/2011     Past Medical History:  Diagnosis Date  . Anxiety   . Arthritis    "jooints" (09/18/2017)  . Asthma   . Bipolar disorder ([place])   . CAP (community acquired pneumonia) 09/18/2017  . Cat allergies   . Chronic bronchitis ([place])    "get it alot; maybe not q yr" (07/06/2014)  . Chronic lower back pain    "spurs and pinched nerves" (09/18/2017)  . COPD (chronic obstructive pulmonary  disease) ([place])   . DDD (degenerative disc disease), lumbar   . Depression    psychiatrist Dr. Toy Care  . Diastolic dysfunction   . Emphysematous COPD ([place])    changes in right base along with patchy areas on last x-ray  . Falls frequently    "several in 2017; fell at dr's office yesterday" (09/18/2017)  . GERD  (gastroesophageal reflux disease)   . HCAP (healthcare-associated pneumonia) 02/12/2016  . Heart murmur   . History of cardiovascular stress test 08/15/2004   EF of 70% -- Normal stress cardiolite.  There is no evidence of ischemia and there is normal LV function. -- [name]. MD  . History of echocardiogram 12/24/2006   Est. EF of 93-79% NORMAL LV SYSTOLIC FUNCTION WITH IMPAIRED RELAXATION -- MILD AORTIC SCLEROSIS -- NORMAL PALONARY ARTERY PRESSURE -- NO OLD ECHOS FOR COMPARISON -- [name], MD  . Hypertension    essential hypertension  . Migraine    "when I was having my periods; none since then" (09/18/2017)  . Necrotic pneumonia ([place])    recurrent/notes 02/12/2016  . Pneumonia    "several times since May 2015" (07/06/2014)  . Pollen allergies   . Tobacco abuse    smoking about 1/2 pk of cigarettes a day     Past Surgical History:  Procedure Laterality Date  . CAROTID STENT    . DILATION AND CURETTAGE OF UTERUS    . TUBAL LIGATION  1986     Family History  Problem Relation Age of Onset  . Stroke Mother   . Heart disease Father   . Hyperlipidemia Father   . Hypertension Father   . Breast cancer Maternal Aunt   . Asthma Maternal Grandmother      Social History   Socioeconomic History  . Marital status: Married    Spouse name: richard  . Number of children: 2  . Years of education: Not on file  . Highest education level: Associate degree: occupational, Hotel manager, or vocational program  Occupational History  . Occupation: Chemical engineer: [employer]    Comment: [employer]  Social Needs  . Financial resource strain: Somewhat hard  . Food insecurity    Worry: Sometimes true    Inability: Sometimes true  . Transportation needs    Medical: No    Non-medical: No  Tobacco Use  . Smoking status: Current Every Day Smoker    Packs/day: 0.50    Years: 46.00    Pack years: 23.00    Types: Cigarettes  . Smokeless tobacco: Never Used  Substance  and Sexual Activity  . Alcohol use: Yes    Alcohol/week: 0.0 standard drinks    Comment: 09/18/2017 "might have 1 drink/month; if that"  . Drug use: No  . Sexual activity: Not Currently  Lifestyle  . Physical activity    Days per week: 0 days    Minutes per session: 0 min  . Stress: Very much  Relationships  . Social Herbalist on phone: Not on file    Gets together: Not on file    Attends religious service: More than 4 times per year    Active member of club or organization: No    Attends meetings of clubs or organizations: Never    Relationship status: Married  . Intimate partner violence    Fear of current or ex partner: No    Emotionally abused: No    Physically abused: No    Forced sexual activity:  No  Other Topics Concern  . Not on file  Social History Narrative   Patient currently lives with her husband.    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 No bird or hot tub exposure. No mold exposure. No recent travel.     Allergies  Allergen Reactions  . Sulfa Drugs Cross Reactors Hives and Rash    Hives and rash  . Ceclor [Cefaclor] Hives    Tolerated ceftazidime December 2016  . Depakote Er [Divalproex Sodium Er] Other (See Comments)    Patient remarked that when she was taking this TWICE a day, she became "clumsy" and felt more prone to stumbling  . Escitalopram Oxalate Other (See Comments)    Pt does not recall ever taking medication (??)  . Sertraline Hcl     Severe headache     Prior to Admission medications   Medication Sig Start Date End Date Taking? Authorizing Provider  aspirin EC 81 MG tablet Take 81-162 mg by mouth See admin instructions. 81 mg once a day "for heart health" and 162 mg one to two times a day as needed for headaches   Yes [provider]  budesonide-formoterol (SYMBICORT) 160-4.5 MCG/ACT inhaler Inhale 2 puffs into the lungs 2  (two) times daily. 09/19/17  Yes Patrecia Pour, Christean Grief, MD  calcium carbonate (TUMS EX) 750 MG chewable tablet Chew 1 tablet by mouth 2 (two) times daily as needed for heartburn.   Yes [provider]  divalproex (DEPAKOTE ER) 250 MG 24 hr tablet Take 3 tablets (750 mg total) by mouth at bedtime. 04/15/19  Yes Arfeen, Arlyce Harman, MD  fluticasone (FLONASE) 50 MCG/ACT nasal spray Place 2 sprays into both nostrils daily. 07/15/19  Yes Maximiano Coss, NP  meloxicam (MOBIC) 15 MG tablet TAKE 1 TABLET BY MOUTH EVERY DAY 07/24/19  Yes Rutherford Guys, MD  methocarbamol (ROBAXIN) 500 MG tablet TAKE 1 TABLET (500 MG TOTAL) BY MOUTH EVERY 8 (EIGHT) HOURS AS NEEDED FOR MUSCLE SPASMS. 05/11/19  Yes Rutherford Guys, MD  nortriptyline (PAMELOR) 50 MG capsule TAKE ONE TO TWO CAPSULES BY MOUTH NIGHTLY 04/15/19  Yes Arfeen, Arlyce Harman, MD  spironolactone (ALDACTONE) 50 MG tablet Take 1 tablet (50 mg total) by mouth daily. Office visit needed for refills 03/11/19  Yes Rutherford Guys, MD  venlafaxine [facility]) 75 MG tablet Take 1 tablet (75 mg total) by mouth daily. 04/15/19 04/14/20 Yes Arfeen, Arlyce Harman, MD  VENTOLIN HFA 108 (90 Base) MCG/ACT inhaler TAKE 2 PUFFS BY MOUTH EVERY 6 HOURS AS NEEDED FOR WHEEZE OR SHORTNESS OF BREATH 05/20/19  Yes Rutherford Guys, MD  ciprofloxacin (CIPRO) 250 MG tablet Take 1 tablet (250 mg total) by mouth 2 (two) times daily. Patient not taking: Reported on 07/27/2019 07/15/19   Maximiano Coss, NP  HYDROcodone-acetaminophen (NORCO/VICODIN) 5-325 MG tablet Take 1 tablet by mouth every 6 (six) hours as needed for moderate pain. Patient not taking: Reported on 07/27/2019 03/31/19   Rutherford Guys, MD  triamcinolone cream (KENALOG) 0.1 % Apply 1 application topically 2 (two) times daily. Patient not taking: Reported on 07/27/2019 09/11/18   Posey Boyer, MD     Depression screen [facility] 2/9 07/27/2019 07/15/2019 04/15/2019 03/31/2019 03/11/2019  Decreased Interest 0 0 0 0 1  Down, Depressed, Hopeless  0 0 0 0 1  PHQ - 2 Score 0 0  0 0 2  Altered sleeping - - - - 0  Tired, decreased energy - - - - 2  Change in appetite - - - - 0  Feeling bad or failure about yourself  - - - - 0  Trouble concentrating - - - - 0  Moving slowly or fidgety/restless - - - - 0  Suicidal thoughts - - - - 0  PHQ-9 Score - - - - 4  Some recent data might be hidden     Fall Risk  07/27/2019 07/15/2019 04/15/2019 03/31/2019 03/11/2019  Falls in the past year? 1 0 0 0 0  Comment - - - - -  Number falls in past yr: 1 - - 0 0  Injury with Fall? 0 0 0 0 0  Comment - - - - -  Risk for fall due to : - - - - -  Follow up Falls evaluation completed;Education provided;Falls prevention discussed - - - -      PHYSICAL EXAM: BP 140/80 Comment: taken from a previous visit  Ht 5\' 2"  (1.575 m)   Wt 117 lb (53.1 kg)   BMI 21.40 kg/m    Wt Readings from Last 3 Encounters:  07/27/19 117 lb (53.1 kg)  07/15/19 117 lb (53.1 kg)  04/15/19 124 lb (56.2 kg)     Medicare annual wellness visit, subsequent     Physical Exam   Education/Counseling provided regarding diet and exercise, prevention of chronic diseases, smoking/tobacco cessation, if applicable, and reviewed "Covered Medicare Preventive Services."

## 2019-07-27 NOTE — Telephone Encounter (Signed)
Tried call no answer AWV

## 2019-07-29 ENCOUNTER — Encounter: Payer: Self-pay | Admitting: Family Medicine

## 2019-07-29 ENCOUNTER — Ambulatory Visit (INDEPENDENT_AMBULATORY_CARE_PROVIDER_SITE_OTHER): Payer: Medicare Other | Admitting: Family Medicine

## 2019-07-29 ENCOUNTER — Other Ambulatory Visit: Payer: Self-pay

## 2019-07-29 VITALS — BP 142/60 | HR 83 | Temp 98.7°F | Ht 62.0 in | Wt 118.2 lb

## 2019-07-29 DIAGNOSIS — M545 Low back pain: Secondary | ICD-10-CM | POA: Diagnosis not present

## 2019-07-29 DIAGNOSIS — R350 Frequency of micturition: Secondary | ICD-10-CM | POA: Diagnosis not present

## 2019-07-29 DIAGNOSIS — I10 Essential (primary) hypertension: Secondary | ICD-10-CM

## 2019-07-29 DIAGNOSIS — G8929 Other chronic pain: Secondary | ICD-10-CM | POA: Diagnosis not present

## 2019-07-29 DIAGNOSIS — D696 Thrombocytopenia, unspecified: Secondary | ICD-10-CM

## 2019-07-29 MED ORDER — METHOCARBAMOL 500 MG PO TABS: 500.0000 mg | ORAL_TABLET | Freq: Three times a day (TID) | ORAL | 1 refills | Status: DC | PRN

## 2019-07-29 MED ORDER — AMLODIPINE BESYLATE 2.5 MG PO TABS: 2.5000 mg | ORAL_TABLET | Freq: Every day | ORAL | 1 refills | Status: DC

## 2019-07-29 MED ORDER — HYDROCODONE-ACETAMINOPHEN 5-325 MG PO TABS: 1.0000 | ORAL_TABLET | Freq: Four times a day (QID) | ORAL | 0 refills | Status: DC | PRN

## 2019-07-29 NOTE — Patient Instructions (Signed)
° ° ° °  If you have lab work done today you will be contacted with your lab results within the next 2 weeks.  If you have not heard from us then please contact us. The fastest way to get your results is to register for My Chart. ° ° °IF you received an x-ray today, you will receive an invoice from [facility]. Please contact  Radiology at [phone] with questions or concerns regarding your invoice.  ° °IF you received labwork today, you will receive an invoice from [facility]. Please contact [facility] at [phone] with questions or concerns regarding your invoice.  ° °Our billing staff will not be able to assist you with questions regarding bills from these companies. ° °You will be contacted with the lab results as soon as they are available. The fastest way to get your results is to activate your My Chart account. Instructions are located on the last page of this paperwork. If you have not heard from us regarding the results in 2 weeks, please contact this office. °  ° ° ° °

## 2019-07-29 NOTE — Progress Notes (Signed)
8/28/20202:59 PM  MISCHA BRITTINGHAM [birth year], 68 y.o., female [id number]  Chief Complaint  Patient presents with  . low platets and lab work    HPI:   Patient is a 68 y.o. female with past medical history significant for HTN, COPD, mood disorder, insomnia who presents today to discuss recent labs  Has never been told she has low platelets She takes ASA but otherwise no abnormal bleeding On review it seems that thrombocytopenia started before she was on depakote  CBC Latest Ref Rng & Units 07/15/2019 10/15/2018 09/18/2017  WBC 3.4 - 10.8 x10E3/uL 4.7 4.0 6.2  Hemoglobin 11.1 - 15.9 g/dL 13.7 16.3(H) 12.4  Hematocrit 34.0 - 46.6 % 40.6 48.4(H) 37.0  Platelets 150 - 450 x10E3/uL 98(LL) 86(LL) 96(L)   Lab Results  Component Value Date   ALT 11 07/15/2019   AST 17 07/15/2019   ALKPHOS 128 (H) 07/15/2019   BILITOT 0.4 07/15/2019    Recently seen at [facility] for UTI Completed abx Still having urinary urgency, no dysuria, no blood Has been referred to urology, she has heard from them already  Requesting refill of vicodin, methocarbamol and meloxicam Takes vicodin prn for her low back pain pmp reviewed  Depression screen [facility] 2/9 07/29/2019 07/27/2019 07/15/2019  Decreased Interest 0 0 0  Down, Depressed, Hopeless 0 0 0  PHQ - 2 Score 0 0 0  Altered sleeping - - -  Tired, decreased energy - - -  Change in appetite - - -  Feeling bad or failure about yourself  - - -  Trouble concentrating - - -  Moving slowly or fidgety/restless - - -  Suicidal thoughts - - -  PHQ-9 Score - - -  Some recent data might be hidden    Fall Risk  07/29/2019 07/27/2019 07/15/2019 04/15/2019 03/31/2019  Falls in the past year? 1 1 0 0 0  Comment - - - - -  Number falls in past yr: 1 1 - - 0  Injury with Fall? 1 0 0 0 0  Comment - - - - -  Risk for fall due to : History of fall(s) - - - -  Follow up Falls evaluation completed Falls evaluation completed;Education provided;Falls prevention  discussed - - -     Allergies  Allergen Reactions  . Sulfa Drugs Cross Reactors Hives and Rash    Hives and rash  . Ceclor [Cefaclor] Hives    Tolerated ceftazidime December 2016  . Depakote Er [Divalproex Sodium Er] Other (See Comments)    Patient remarked that when she was taking this TWICE a day, she became "clumsy" and felt more prone to stumbling  . Escitalopram Oxalate Other (See Comments)    Pt does not recall ever taking medication (??)  . Sertraline Hcl     Severe headache    Prior to Admission medications   Medication Sig Start Date End Date Taking? Authorizing Provider  aspirin EC 81 MG tablet Take 81-162 mg by mouth See admin instructions. 81 mg once a day "for heart health" and 162 mg one to two times a day as needed for headaches   Yes [provider]  budesonide-formoterol (SYMBICORT) 160-4.5 MCG/ACT inhaler Inhale 2 puffs into the lungs 2 (two) times daily. 09/19/17  Yes Patrecia Pour, Christean Grief, MD  calcium carbonate (TUMS EX) 750 MG chewable tablet Chew 1 tablet by mouth 2 (two) times daily as needed for heartburn.   Yes [provider]  divalproex (DEPAKOTE ER) 250 MG  24 hr tablet Take 3 tablets (750 mg total) by mouth at bedtime. 04/15/19  Yes Arfeen, Arlyce Harman, MD  fluticasone (FLONASE) 50 MCG/ACT nasal spray Place 2 sprays into both nostrils daily. 07/15/19  Yes Maximiano Coss, NP  meloxicam (MOBIC) 15 MG tablet TAKE 1 TABLET BY MOUTH EVERY DAY 07/24/19  Yes Rutherford Guys, MD  methocarbamol (ROBAXIN) 500 MG tablet TAKE 1 TABLET (500 MG TOTAL) BY MOUTH EVERY 8 (EIGHT) HOURS AS NEEDED FOR MUSCLE SPASMS. 07/29/19  Yes Rutherford Guys, MD  nortriptyline (PAMELOR) 50 MG capsule TAKE ONE TO TWO CAPSULES BY MOUTH NIGHTLY 04/15/19  Yes Arfeen, Arlyce Harman, MD  spironolactone (ALDACTONE) 50 MG tablet Take 1 tablet (50 mg total) by mouth daily. Office visit needed for refills 03/11/19  Yes Rutherford Guys, MD  triamcinolone cream (KENALOG) 0.1 % Apply 1 application  topically 2 (two) times daily. 09/11/18  Yes Posey Boyer, MD  venlafaxine (EFFEXOR) 75 MG tablet Take 1 tablet (75 mg total) by mouth daily. 04/15/19 04/14/20 Yes Arfeen, Arlyce Harman, MD  VENTOLIN HFA 108 (90 Base) MCG/ACT inhaler TAKE 2 PUFFS BY MOUTH EVERY 6 HOURS AS NEEDED FOR WHEEZE OR SHORTNESS OF BREATH 05/20/19  Yes Rutherford Guys, MD    Past Medical History:  Diagnosis Date  . Anxiety   . Arthritis    "jooints" (09/18/2017)  . Asthma   . Bipolar disorder ([place])   . CAP (community acquired pneumonia) 09/18/2017  . Cat allergies   . Chronic bronchitis ([place])    "get it alot; maybe not q yr" (07/06/2014)  . Chronic lower back pain    "spurs and pinched nerves" (09/18/2017)  . COPD (chronic obstructive pulmonary disease) ([place])   . DDD (degenerative disc disease), lumbar   . Depression    psychiatrist Dr. Toy Care  . Diastolic dysfunction   . Emphysematous COPD ([place])    changes in right base along with patchy areas on last x-ray  . Falls frequently    "several in 2017; fell at dr's office yesterday" (09/18/2017)  . GERD (gastroesophageal reflux disease)   . HCAP (healthcare-associated pneumonia) 02/12/2016  . Heart murmur   . History of cardiovascular stress test 08/15/2004   EF of 70% -- Normal stress cardiolite.  There is no evidence of ischemia and there is normal LV function. -- [name]. MD  . History of echocardiogram 12/24/2006   Est. EF of 20-25% NORMAL LV SYSTOLIC FUNCTION WITH IMPAIRED RELAXATION -- MILD AORTIC SCLEROSIS -- NORMAL PALONARY ARTERY PRESSURE -- NO OLD ECHOS FOR COMPARISON -- [name], MD  . Hypertension    essential hypertension  . Migraine    "when I was having my periods; none since then" (09/18/2017)  . Necrotic pneumonia ([place])    recurrent/notes 02/12/2016  . Pneumonia    "several times since May 2015" (07/06/2014)  . Pollen allergies   . Tobacco abuse    smoking about 1/2 pk of cigarettes a day    Past Surgical History:  Procedure  Laterality Date  . CAROTID STENT    . DILATION AND CURETTAGE OF UTERUS    . TUBAL LIGATION  1986    Social History   Tobacco Use  . Smoking status: Current Every Day Smoker    Packs/day: 0.50    Years: 46.00    Pack years: 23.00    Types: Cigarettes  . Smokeless tobacco: Never Used  Substance Use Topics  . Alcohol use: Yes    Alcohol/week: 0.0 standard  drinks    Comment: 09/18/2017 "might have 1 drink/month; if that"    Family History  Problem Relation Age of Onset  . Stroke Mother   . Heart disease Father   . Hyperlipidemia Father   . Hypertension Father   . Breast cancer Maternal Aunt   . Asthma Maternal Grandmother     ROS Per hpi  OBJECTIVE:  Today's Vitals   07/29/19 1417 07/29/19 1529  BP: (!) 146/77 (!) 142/60  Pulse: 83   Temp: 98.7 F (37.1 C)   TempSrc: Oral   SpO2: 95%   Weight: 118 lb 3.2 oz (53.6 kg)   Height: 5\' 2"  (1.575 m)    Body mass index is 21.62 kg/m.  BP Readings from Last 3 Encounters:  07/29/19 (!) 142/60  07/27/19 140/80  07/15/19 140/80    Physical Exam Vitals signs and nursing note reviewed.  Constitutional:      Appearance: She is well-developed.  HENT:     Head: Normocephalic and atraumatic.     Mouth/Throat:     Pharynx: No oropharyngeal exudate.  Eyes:     General: No scleral icterus.    Conjunctiva/sclera: Conjunctivae normal.     Pupils: Pupils are equal, round, and reactive to light.  Neck:     Musculoskeletal: Neck supple.  Cardiovascular:     Rate and Rhythm: Normal rate and regular rhythm.     Heart sounds: Normal heart sounds. No murmur. No friction rub. No gallop.   Pulmonary:     Effort: Pulmonary effort is normal.     Breath sounds: Normal breath sounds. No wheezing or rales.  Skin:    General: Skin is warm and dry.  Neurological:     Mental Status: She is alert and oriented to person, place, and time.     No results found for this or any previous visit (from the past 24 hour(s)).  No results  found.   ASSESSMENT and PLAN  1. Frequent urination Encouraged her to keep appt with urology - Urinalysis, Routine w reflex microscopic  2. Thrombocytopenia ([place]) Labs pending. Consider heme referral. - Pathologist smear review - Hepatitis C antibody - Sedimentation Rate - Folate - Vitamin B12  3. Chronic bilateral low back pain without sciatica Holding meloxicam due to low plts. Refilled vicodin and methocarbamol.   4. Essential hypertension Above goal. Adding amlodipine 2.5mg  daily. - Care order/instruction:  Other orders - methocarbamol (ROBAXIN) 500 MG tablet; Take 1 tablet (500 mg total) by mouth every 8 (eight) hours as needed for muscle spasms. - HYDROcodone-acetaminophen (NORCO/VICODIN) 5-325 MG tablet; Take 1 tablet by mouth every 6 (six) hours as needed for moderate pain.  Return in about 3 months (around 10/29/2019) for HTN.    Rutherford Guys, MD [facility] [place],  [address] Ph.  [phone] Fax [phone]

## 2019-07-30 LAB — URINALYSIS, ROUTINE W REFLEX MICROSCOPIC
Bilirubin, UA: NEGATIVE
Glucose, UA: NEGATIVE
Ketones, UA: NEGATIVE
Leukocytes,UA: NEGATIVE
Nitrite, UA: NEGATIVE
Protein,UA: NEGATIVE
RBC, UA: NEGATIVE
Specific Gravity, UA: 1.009 (ref 1.005–1.030)
Urobilinogen, Ur: 0.2 mg/dL (ref 0.2–1.0)
pH, UA: 6 (ref 5.0–7.5)

## 2019-07-30 LAB — SEDIMENTATION RATE: Sed Rate: 26 mm/hr (ref 0–40)

## 2019-07-30 LAB — VITAMIN B12: Vitamin B-12: 747 pg/mL (ref 232–1245)

## 2019-07-30 LAB — HEPATITIS C ANTIBODY: Hep C Virus Ab: <0.1 s/co ratio (ref 0.0–0.9)

## 2019-07-30 LAB — FOLATE: Folate: 11.2 ng/mL (ref >3.0)

## 2019-08-01 LAB — PATHOLOGIST SMEAR REVIEW
Basophils Absolute: 0 10*3/uL (ref 0.0–0.2)
Basos: 1 %
EOS (ABSOLUTE): 0.3 10*3/uL (ref 0.0–0.4)
Eos: 5 %
Hematocrit: 41.8 % (ref 34.0–46.6)
Hemoglobin: 14.1 g/dL (ref 11.1–15.9)
Immature Grans (Abs): 0.1 10*3/uL (ref 0.0–0.1)
Immature Granulocytes: 1 %
Lymphocytes Absolute: 1.3 10*3/uL (ref 0.7–3.1)
Lymphs: 22 %
MCH: 29.1 pg (ref 26.6–33.0)
MCHC: 33.7 g/dL (ref 31.5–35.7)
MCV: 86 fL (ref 79–97)
Monocytes Absolute: 0.4 10*3/uL (ref 0.1–0.9)
Monocytes: 8 %
Neutrophils Absolute: 3.6 10*3/uL (ref 1.4–7.0)
Neutrophils: 63 %
Path Rev RBC: NORMAL
Path Rev WBC: NORMAL
Platelets: 122 10*3/uL — ABNORMAL LOW (ref 150–450)
RBC: 4.85 x10E6/uL (ref 3.77–5.28)
RDW: 13.4 % (ref 11.7–15.4)
WBC: 5.7 10*3/uL (ref 3.4–10.8)

## 2019-08-11 ENCOUNTER — Other Ambulatory Visit (HOSPITAL_COMMUNITY): Payer: Self-pay | Admitting: Psychiatry

## 2019-08-11 DIAGNOSIS — F411 Generalized anxiety disorder: Secondary | ICD-10-CM

## 2019-08-11 DIAGNOSIS — F418 Other specified anxiety disorders: Secondary | ICD-10-CM

## 2019-08-12 ENCOUNTER — Other Ambulatory Visit (HOSPITAL_COMMUNITY): Payer: Self-pay

## 2019-08-12 DIAGNOSIS — F411 Generalized anxiety disorder: Secondary | ICD-10-CM

## 2019-08-12 MED ORDER — VENLAFAXINE HCL 75 MG PO TABS: 75.0000 mg | ORAL_TABLET | Freq: Every day | ORAL | 0 refills | Status: DC

## 2019-08-17 ENCOUNTER — Ambulatory Visit (INDEPENDENT_AMBULATORY_CARE_PROVIDER_SITE_OTHER): Payer: Medicare Other | Admitting: Psychiatry

## 2019-08-17 ENCOUNTER — Other Ambulatory Visit: Payer: Self-pay

## 2019-08-17 ENCOUNTER — Encounter (HOSPITAL_COMMUNITY): Payer: Self-pay | Admitting: Psychiatry

## 2019-08-17 DIAGNOSIS — F319 Bipolar disorder, unspecified: Secondary | ICD-10-CM | POA: Diagnosis not present

## 2019-08-17 DIAGNOSIS — Z79899 Other long term (current) drug therapy: Secondary | ICD-10-CM | POA: Diagnosis not present

## 2019-08-17 DIAGNOSIS — F411 Generalized anxiety disorder: Secondary | ICD-10-CM

## 2019-08-17 MED ORDER — VENLAFAXINE HCL 75 MG PO TABS: 75.0000 mg | ORAL_TABLET | Freq: Every day | ORAL | 0 refills | Status: DC

## 2019-08-17 MED ORDER — NORTRIPTYLINE HCL 50 MG PO CAPS: ORAL_CAPSULE | ORAL | 0 refills | Status: DC

## 2019-08-17 MED ORDER — DIVALPROEX SODIUM ER 250 MG PO TB24: 750.0000 mg | ORAL_TABLET | Freq: Every day | ORAL | 2 refills | Status: DC

## 2019-08-17 MED ORDER — HYDROXYZINE HCL 10 MG PO TABS: 10.0000 mg | ORAL_TABLET | Freq: Every evening | ORAL | 0 refills | Status: DC | PRN

## 2019-08-17 NOTE — Progress Notes (Signed)
Virtual Visit via Telephone Note  I connected with Catherine Kelley on 08/17/19 at 11:00 AM EDT by telephone and verified that I am speaking with the correct person using two identifiers.   I discussed the limitations, risks, security and privacy concerns of performing an evaluation and management service by telephone and the availability of in person appointments. I also discussed with the patient that there may be a patient responsible charge related to this service. The patient expressed understanding and agreed to proceed.   History of Present Illness: Patient was evaluated by phone session.  She had missed last appointment.  We have decreased her Depakote from 1500 mg to 750 as she was complaining of dizziness and sluggish.  Though her dizziness and sluggish is much better but she noticed some time having racing thoughts and poor sleep.  She also feels anxious especially at night.  She is pleased that her daughter is much better and she has no more symptoms of pneumonia.  She does not see her grandkids as frequent because her daughter is busy in their life.  She lives with her husband who is supportive.  We also increase Effexor on her last visit which helped her mood and depression.  She denies any mood swings, irritability, highs or lows or any mania.  I have recommended to try nortriptyline to cut down 50 but she could not sleep and now she is back on 100 mg after given the try for 50 mg for few days.  Though she sleeps but there are some anxiety and racing thoughts.  She wanted something else to help her anxiety and sleep at night.  I reviewed her recent blood work results.  She has low platelet.  She has no Depakote level in a while.  Her PCP recommended to stop the aspirin.  She is no longer taking gabapentin since it was not working for her anxiety.  Her appetite is okay.  Energy level is fair.  She denies any hallucination, paranoia or any suicidal thoughts.  She has no tremors shakes or any EPS.     Past Psychiatric History:Reviewed. H/O depression, anxiety and bipolar disorder for more than 20 years. Had tried Paxil, Prozac and Zoloft that cause headaches. Took Xanax for many years however it was not renewed by primary care physician when [name] office did not accept her insurance. Did not recall any history of suicidal attempt or psychiatric inpatient treatment.  Recent Results (from the past 2160 hour(s))  POCT urinalysis dipstick     Status: Abnormal   Collection Time: 07/15/19 11:17 AM  Result Value Ref Range   Color, UA yellow yellow   Clarity, UA cloudy (A) clear   Glucose, UA negative negative mg/dL   Bilirubin, UA negative negative   Ketones, POC UA trace (5) (A) negative mg/dL   Spec Grav, UA 1.015 1.010 - 1.025   Blood, UA negative negative   pH, UA 7.0 5.0 - 8.0   Protein Ur, POC negative negative mg/dL   Urobilinogen, UA 1.0 0.2 or 1.0 E.U./dL   Nitrite, UA Positive (A) Negative   Leukocytes, UA Trace (A) Negative  Urine Culture     Status: Abnormal   Collection Time: 07/15/19 11:19 AM   Specimen: Urine   UR  Result Value Ref Range   Urine Culture, Routine Final report (A)    Organism ID, Bacteria Escherichia coli (A)     Comment: 25,000-50,000 colony forming units per mL Cefazolin <=4 ug/mL Cefazolin with an  MIC <=16 predicts susceptibility to the oral agents cefaclor, cefdinir, cefpodoxime, cefprozil, cefuroxime, cephalexin, and loracarbef when used for therapy of uncomplicated urinary tract infections due to E. coli, Klebsiella pneumoniae, and Proteus mirabilis.    Antimicrobial Susceptibility Comment     Comment:       ** S = Susceptible; I = Intermediate; R = Resistant **                    P = Positive; N = Negative             MICS are expressed in micrograms per mL    Antibiotic                 RSLT#1    RSLT#2    RSLT#3    RSLT#4 Amoxicillin/Clavulanic Acid    S Ampicillin                     S Cefepime                        S Ceftriaxone                    S Cefuroxime                     S Ciprofloxacin                  S Ertapenem                      S Gentamicin                     S Imipenem                       S Levofloxacin                   S Meropenem                      S Nitrofurantoin                 S Piperacillin/Tazobactam        S Tetracycline                   S Tobramycin                     S Trimethoprim/Sulfa             S   Comprehensive metabolic panel     Status: Abnormal   Collection Time: 07/15/19 11:48 AM  Result Value Ref Range   Glucose 111 (H) 65 - 99 mg/dL   BUN 9 8 - 27 mg/dL   Creatinine, Ser 0.59 0.57 - 1.00 mg/dL   GFR calc non Af Amer 94 >59 mL/min/1.73   GFR calc Af Amer 109 >59 mL/min/1.73   BUN/Creatinine Ratio 15 12 - 28   Sodium 135 134 - 144 mmol/L   Potassium 4.0 3.5 - 5.2 mmol/L   Chloride 99 96 - 106 mmol/L   CO2 22 20 - 29 mmol/L   Calcium 8.9 8.7 - 10.3 mg/dL   Total Protein 7.0 6.0 - 8.5 g/dL   Albumin 4.1 3.8 - 4.8 g/dL   Globulin, Total 2.9 1.5 - 4.5 g/dL   Albumin/Globulin Ratio 1.4 1.2 - 2.2  Bilirubin Total 0.4 0.0 - 1.2 mg/dL   Alkaline Phosphatase 128 (H) 39 - 117 IU/L   AST 17 0 - 40 IU/L   ALT 11 0 - 32 IU/L  CBC     Status: Abnormal   Collection Time: 07/15/19 11:48 AM  Result Value Ref Range   WBC 4.7 3.4 - 10.8 x10E3/uL   RBC 4.65 3.77 - 5.28 x10E6/uL   Hemoglobin 13.7 11.1 - 15.9 g/dL   Hematocrit 40.6 34.0 - 46.6 %   MCV 87 79 - 97 fL   MCH 29.5 26.6 - 33.0 pg   MCHC 33.7 31.5 - 35.7 g/dL   RDW 13.3 11.7 - 15.4 %   Platelets 98 (LL) 150 - 450 x10E3/uL    Comment: Actual platelet count may be somewhat higher than reported due to aggregation of platelets in this sample.    Hematology Comments: Note:     Comment: Verified by microscopic examination.  Pathologist smear review     Status: Abnormal   Collection Time: 07/29/19  3:52 PM  Result Value Ref Range   Path Rev WBC Appear normal.    Path Rev RBC Appear normal.     Path Rev PLTs Comment     Comment: Giant platelets noted.   PATHOLOGIST NAME Comment     Comment: Reviewed by:  Davonna Belling, MD, Pathologist   WBC 5.7 3.4 - 10.8 x10E3/uL   RBC 4.85 3.77 - 5.28 x10E6/uL   Hemoglobin 14.1 11.1 - 15.9 g/dL   Hematocrit 41.8 34.0 - 46.6 %   MCV 86 79 - 97 fL   MCH 29.1 26.6 - 33.0 pg   MCHC 33.7 31.5 - 35.7 g/dL   RDW 13.4 11.7 - 15.4 %   Platelets 122 (L) 150 - 450 x10E3/uL   Neutrophils 63 Not Estab. %   Lymphs 22 Not Estab. %   Monocytes 8 Not Estab. %   Eos 5 Not Estab. %   Basos 1 Not Estab. %   Neutrophils Absolute 3.6 1.4 - 7.0 x10E3/uL   Lymphocytes Absolute 1.3 0.7 - 3.1 x10E3/uL   Monocytes Absolute 0.4 0.1 - 0.9 x10E3/uL   EOS (ABSOLUTE) 0.3 0.0 - 0.4 x10E3/uL   Basophils Absolute 0.0 0.0 - 0.2 x10E3/uL   Immature Granulocytes 1 Not Estab. %   Immature Grans (Abs) 0.1 0.0 - 0.1 x10E3/uL  Hepatitis C antibody     Status: None   Collection Time: 07/29/19  3:53 PM  Result Value Ref Range   Hep C Virus Ab <0.1 0.0 - 0.9 s/co ratio    Comment:                                   Negative:     < 0.8                              Indeterminate: 0.8 - 0.9                                   Positive:     > 0.9  The CDC recommends that a positive HCV antibody result  be followed up with a HCV Nucleic Acid Amplification  test (329518).   Sedimentation Rate     Status: None   Collection Time: 07/29/19  3:53 PM  Result Value Ref Range   Sed Rate 26 0 - 40 mm/hr  Folate     Status: None   Collection Time: 07/29/19  3:53 PM  Result Value Ref Range   Folate 11.2 >3.0 ng/mL    Comment: A serum folate concentration of less than 3.1 ng/mL is considered to represent clinical deficiency.   Vitamin B12     Status: None   Collection Time: 07/29/19  3:53 PM  Result Value Ref Range   Vitamin B-12 747 232 - 1,245 pg/mL  Urinalysis, Routine w reflex microscopic     Status: None   Collection Time: 07/29/19  4:06 PM  Result Value Ref Range    Specific Gravity, UA 1.009 1.005 - 1.030   pH, UA 6.0 5.0 - 7.5   Color, UA Yellow Yellow   Appearance Ur Clear Clear   Leukocytes,UA Negative Negative   Protein,UA Negative Negative/Trace   Glucose, UA Negative Negative   Ketones, UA Negative Negative   RBC, UA Negative Negative   Bilirubin, UA Negative Negative   Urobilinogen, Ur 0.2 0.2 - 1.0 mg/dL   Nitrite, UA Negative Negative   Microscopic Examination Comment     Comment: Microscopic not indicated and not performed.   Psychiatric Specialty Exam: Physical Exam  ROS  There were no vitals taken for this visit.There is no height or weight on file to calculate BMI.  General Appearance: NA  Eye Contact:  NA  Speech:  Slow  Volume:  Normal  Mood:  Anxious  Affect:  NA  Thought Process:  Goal Directed  Orientation:  Full (Time, Place, and Person)  Thought Content:  Rumination  Suicidal Thoughts:  No  Homicidal Thoughts:  No  Memory:  Immediate;   Good Recent;   Good Remote;   Good  Judgement:  Fair  Insight:  Good  Psychomotor Activity:  NA  Concentration:  Concentration: Fair and Attention Span: Fair  Recall:  Good  Fund of Knowledge:  Good  Language:  Good  Akathisia:  No  Handed:  Right  AIMS (if indicated):     Assets:  Communication Skills Desire for Improvement Housing Resilience Social Support  ADL's:  Intact  Cognition:  WNL  Sleep:   fair     Assessment and Plan: Bipolar disorder type I.  Generalized anxiety disorder.  I reviewed her medication, recent blood work results and notes from primary care physician.  Her platelets are low which is 99.  Her low platelet could be due to aspirin or Depakote.  She is no longer taking aspirin however we do not have recent Depakote level.  I recommend to have Depakote level done the patient due to lack of transportation and dependent on her husband cannot come to the office.  However she promised that she will coordinate with her husband to schedule blood work for  Depakote level.  I also suggested that if she is going to see her primary care physician they can do the Depakote level and I will order the labs.  She agreed with the plan.  She feels increase Effexor help her mood.  I will continue Effexor 75 mg in the morning, continue nortriptyline 100 mg at bedtime, continue Depakote 750 mg at bedtime which was reduced from 1500 mg and we will add low-dose hydroxyzine 10 tablet to help her insomnia and anxiety.  We will defer increasing the dose of hydroxyzine since patient has history of dizziness and I strongly encouraged she should not be walking or  driving after taking the hydroxyzine tablet.  I also offered therapy but she is not interested.  I recommended to call us back if she has any question or any concern and keep appointment in the future.  She agreed with the plan.  Recommended to call us back if she has any question or any concern and discuss safety concerns and anytime having active suicidal thoughts or homicidal thought then she need to call 911 or go to local emergency room.  I will forward my note to her primary care physician.  Follow-up in 3 months.  Time spent 25 minutes.  Follow Up Instructions:    I discussed the assessment and treatment plan with the patient. The patient was provided an opportunity to ask questions and all were answered. The patient agreed with the plan and demonstrated an understanding of the instructions.   The patient was advised to call back or seek an in-person evaluation if the symptoms worsen or if the condition fails to improve as anticipated.  I provided 25 minutes of non-face-to-face time during this encounter.   Kathlee Nations, MD

## 2019-08-24 NOTE — Telephone Encounter (Signed)
Schedule AWV.  

## 2019-09-02 ENCOUNTER — Ambulatory Visit: Payer: Medicare Other | Admitting: Family Medicine

## 2019-09-05 ENCOUNTER — Encounter: Payer: Self-pay | Admitting: Registered Nurse

## 2019-09-05 ENCOUNTER — Other Ambulatory Visit: Payer: Self-pay

## 2019-09-05 ENCOUNTER — Ambulatory Visit (INDEPENDENT_AMBULATORY_CARE_PROVIDER_SITE_OTHER): Payer: Medicare Other | Admitting: Registered Nurse

## 2019-09-05 VITALS — BP 129/72 | HR 85 | Temp 97.6°F | Ht 62.0 in | Wt 117.0 lb

## 2019-09-05 DIAGNOSIS — K648 Other hemorrhoids: Secondary | ICD-10-CM

## 2019-09-05 NOTE — Progress Notes (Signed)
Acute Office Visit  Subjective:    Patient ID: Catherine Kelley, female    DOB: Jan 07, 1951, 68 y.o.   MRN: [id number]  Chief Complaint  Patient presents with   Constipation   vaginal itchy    HPI Patient is in today for constipation. This has been an intermittent problem for her. She states that this past weekend, on Saturday night, she was experiencing constipation and extreme rectal pain. She states that she was eventually able to pass a hard stool. Since then, she has had two normal bowel movements. She denies change in lifestyle or diet. She has tried taking laxatives in the past, but reports MiraLax was not effective and Magnesium Citrate was too potent. She does note that when she wipes, there is bright red blood on the tissue. She also notes substantial feelings of tenesmus after defecation. She had her last colonoscopy in 2015 and reports that there were no abnormal findings.  Denies melena, shob, chest pain, lightheadedness, dizziness, LOC, NVD. Pain localized to rectum.   Past Medical History:  Diagnosis Date   Anxiety    Arthritis    "jooints" (09/18/2017)   Asthma    Bipolar disorder ([place])    CAP (community acquired pneumonia) 09/18/2017   Cat allergies    Chronic bronchitis ([place])    "get it alot; maybe not q yr" (07/06/2014)   Chronic lower back pain    "spurs and pinched nerves" (09/18/2017)   COPD (chronic obstructive pulmonary disease) ([place])    DDD (degenerative disc disease), lumbar    Depression    psychiatrist Dr. Toy Care   Diastolic dysfunction    Emphysematous COPD ([place])    changes in right base along with patchy areas on last x-ray   Falls frequently    "several in 2017; fell at dr's office yesterday" (09/18/2017)   GERD (gastroesophageal reflux disease)    HCAP (healthcare-associated pneumonia) 02/12/2016   Heart murmur    History of cardiovascular stress test 08/15/2004   EF of 70% -- Normal stress cardiolite.  There is no evidence of  ischemia and there is normal LV function. -- [name]. MD   History of echocardiogram 12/24/2006   Est. EF of 89-38% NORMAL LV SYSTOLIC FUNCTION WITH IMPAIRED RELAXATION -- MILD AORTIC SCLEROSIS -- NORMAL PALONARY ARTERY PRESSURE -- NO OLD ECHOS FOR COMPARISON -- [name], MD   Hypertension    essential hypertension   Migraine    "when I was having my periods; none since then" (09/18/2017)   Necrotic pneumonia ([place])    recurrent/notes 02/12/2016   Pneumonia    "several times since May 2015" (07/06/2014)   Pollen allergies    Tobacco abuse    smoking about 1/2 pk of cigarettes a day    Past Surgical History:  Procedure Laterality Date   CAROTID STENT     DILATION AND CURETTAGE OF UTERUS     TUBAL LIGATION  1986    Family History  Problem Relation Age of Onset   Stroke Mother    Heart disease Father    Hyperlipidemia Father    Hypertension Father    Breast cancer Maternal Aunt    Asthma Maternal Grandmother     Social History   Socioeconomic History   Marital status: Married    Spouse name: Saniyya Gau   Number of children: 2   Years of education: Not on file   Highest education level: Associate degree: occupational, Hotel manager, or vocational program  Occupational History   Occupation:  SALES ASSOCIATE    Employer: [employer]    Comment: [employer]  Social Designer, fashion/clothing strain: Somewhat hard   Food insecurity    Worry: Sometimes true    Inability: Sometimes true   Transportation needs    Medical: No    Non-medical: No  Tobacco Use   Smoking status: Current Every Day Smoker    Packs/day: 0.50    Years: 46.00    Pack years: 23.00    Types: Cigarettes   Smokeless tobacco: Never Used  Substance and Sexual Activity   Alcohol use: Yes    Alcohol/week: 0.0 standard drinks    Comment: 09/18/2017 "might have 1 drink/month; if that"   Drug use: No   Sexual activity: Not Currently  Lifestyle   Physical activity    Days  per week: 0 days    Minutes per session: 0 min   Stress: Very much  Relationships   Social connections    Talks on phone: Not on file    Gets together: Not on file    Attends religious service: More than 4 times per year    Active member of club or organization: No    Attends meetings of clubs or organizations: Never    Relationship status: Married   Intimate partner violence    Fear of current or ex partner: No    Emotionally abused: No    Physically abused: No    Forced sexual activity: No  Other Topics Concern   Not on file  Social History Narrative   Patient currently lives with her husband.    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 No bird or hot tub exposure. No mold exposure. No recent travel.    Outpatient Medications Prior to Visit  Medication Sig Dispense Refill   amLODipine (NORVASC) 2.5 MG tablet Take 1 tablet (2.5 mg total) by mouth daily. 90 tablet 1   aspirin EC 81 MG tablet Take 81-162 mg by mouth See admin instructions. 81 mg once a day "for heart health" and 162 mg one to two times a day as needed for headaches     budesonide-formoterol (SYMBICORT) 160-4.5 MCG/ACT inhaler Inhale 2 puffs into the lungs 2 (two) times daily. 1 Inhaler 12   calcium carbonate (TUMS EX) 750 MG chewable tablet Chew 1 tablet by mouth 2 (two) times daily as needed for heartburn.     divalproex (DEPAKOTE ER) 250 MG 24 hr tablet Take 3 tablets (750 mg total) by mouth at bedtime. 90 tablet 2   fluticasone (FLONASE) 50 MCG/ACT nasal spray Place 2 sprays into both nostrils daily. 16 g 6   HYDROcodone-acetaminophen (NORCO/VICODIN) 5-325 MG tablet Take 1 tablet by mouth every 6 (six) hours as needed for moderate pain. 20 tablet 0   hydrOXYzine (ATARAX/VISTARIL) 10 MG tablet Take 1 tablet (10 mg total) by mouth at bedtime as needed for anxiety. 30 tablet 0   methocarbamol (ROBAXIN) 500 MG  tablet Take 1 tablet (500 mg total) by mouth every 8 (eight) hours as needed for muscle spasms. 30 tablet 1   nortriptyline (PAMELOR) 50 MG capsule TAKE ONE TO TWO CAPSULES BY MOUTH NIGHTLY 180 capsule 0   spironolactone (ALDACTONE) 50 MG tablet Take 1 tablet (50 mg total) by mouth daily. Office visit needed for refills 90 tablet 1   triamcinolone cream (KENALOG) 0.1 % Apply  1 application topically 2 (two) times daily. 30 g 1   venlafaxine (EFFEXOR) 75 MG tablet Take 1 tablet (75 mg total) by mouth daily. 90 tablet 0   VENTOLIN HFA 108 (90 Base) MCG/ACT inhaler TAKE 2 PUFFS BY MOUTH EVERY 6 HOURS AS NEEDED FOR WHEEZE OR SHORTNESS OF BREATH 18 g 5   No facility-administered medications prior to visit.     Allergies  Allergen Reactions   Sulfa Drugs Cross Reactors Hives and Rash    Hives and rash   Ceclor [Cefaclor] Hives    Tolerated ceftazidime December 2016   Depakote Er [Divalproex Sodium Er] Other (See Comments)    Patient remarked that when she was taking this TWICE a day, she became "clumsy" and felt more prone to stumbling   Escitalopram Oxalate Other (See Comments)    Pt does not recall ever taking medication (??)   Sertraline Hcl     Severe headache    ROS Per hpi     Objective:    Physical Exam  Constitutional: She is oriented to person, place, and time. She appears well-developed and well-nourished. No distress.  Cardiovascular: Normal rate, regular rhythm and normal heart sounds.  Pulmonary/Chest: Effort normal and breath sounds normal. No respiratory distress.  Genitourinary:    Genitourinary Comments: Deferred by patient   Neurological: She is alert and oriented to person, place, and time.  Skin: Skin is warm and dry. No rash noted. She is not diaphoretic. No erythema. No pallor.  Psychiatric: She has a normal mood and affect. Her behavior is normal. Judgment and thought content normal.    BP 129/72 (BP Location: Right Arm, Patient Position: Sitting,  Cuff Size: Normal)    Pulse 85    Temp 97.6 F (36.4 C) (Oral)    Ht 5\' 2"  (1.575 m)    Wt 117 lb (53.1 kg)    SpO2 94%    BMI 21.40 kg/m  Wt Readings from Last 3 Encounters:  09/05/19 117 lb (53.1 kg)  07/29/19 118 lb 3.2 oz (53.6 kg)  07/27/19 117 lb (53.1 kg)    Health Maintenance Due  Topic Date Due   INFLUENZA VACCINE  07/02/2019    There are no preventive care reminders to display for this patient.   Lab Results  Component Value Date   TSH 2.070 10/15/2018   Lab Results  Component Value Date   WBC 5.7 07/29/2019   HGB 14.1 07/29/2019   HCT 41.8 07/29/2019   MCV 86 07/29/2019   PLT 122 (L) 07/29/2019   Lab Results  Component Value Date   NA 135 07/15/2019   K 4.0 07/15/2019   CO2 22 07/15/2019   GLUCOSE 111 (H) 07/15/2019   BUN 9 07/15/2019   CREATININE 0.59 07/15/2019   BILITOT 0.4 07/15/2019   ALKPHOS 128 (H) 07/15/2019   AST 17 07/15/2019   ALT 11 07/15/2019   PROT 7.0 07/15/2019   ALBUMIN 4.1 07/15/2019   CALCIUM 8.9 07/15/2019   ANIONGAP 9 09/18/2017   GFR 186.01 02/04/2016   No results found for: CHOL No results found for: HDL No results found for: LDLCALC No results found for: TRIG No results found for: CHOLHDL Lab Results  Component Value Date   HGBA1C 5.2 07/20/2017       Assessment & Plan:   Problem List Items Addressed This Visit    None       No orders of the defined types were placed in this encounter.  PLAN  Pt  history and symptoms consistent with internal hemorrhoids, though she states she would prefer to have exam with specialist. Referral to GI sent.   Recommended psyllium husk with adequate hydration, MiraLax if needed, magnesium citrate should be last resort.   Patient encouraged to call clinic with any questions, comments, or concerns.    Maximiano Coss, NP

## 2019-09-05 NOTE — Patient Instructions (Signed)
° ° ° °  If you have lab work done today you will be contacted with your lab results within the next 2 weeks.  If you have not heard from us then please contact us. The fastest way to get your results is to register for My Chart. ° ° °IF you received an x-ray today, you will receive an invoice from [facility]. Please contact [facility] at [phone] with questions or concerns regarding your invoice.  ° °IF you received labwork today, you will receive an invoice from [facility]. Please contact [facility] at [phone] with questions or concerns regarding your invoice.  ° °Our billing staff will not be able to assist you with questions regarding bills from these companies. ° °You will be contacted with the lab results as soon as they are available. The fastest way to get your results is to activate your My Chart account. Instructions are located on the last page of this paperwork. If you have not heard from us regarding the results in 2 weeks, please contact this office. °  ° ° ° °

## 2019-09-06 ENCOUNTER — Encounter: Payer: Self-pay | Admitting: Physician Assistant

## 2019-09-09 ENCOUNTER — Ambulatory Visit: Payer: Medicare Other | Admitting: Family Medicine

## 2019-09-10 ENCOUNTER — Other Ambulatory Visit (HOSPITAL_COMMUNITY): Payer: Self-pay | Admitting: Psychiatry

## 2019-09-10 DIAGNOSIS — F411 Generalized anxiety disorder: Secondary | ICD-10-CM

## 2019-09-16 ENCOUNTER — Encounter: Payer: Self-pay | Admitting: Family Medicine

## 2019-09-16 ENCOUNTER — Ambulatory Visit: Payer: Medicare Other | Admitting: Physician Assistant

## 2019-09-16 ENCOUNTER — Telehealth: Payer: Self-pay | Admitting: Family Medicine

## 2019-09-16 ENCOUNTER — Ambulatory Visit (INDEPENDENT_AMBULATORY_CARE_PROVIDER_SITE_OTHER): Payer: Medicare Other | Admitting: Family Medicine

## 2019-09-16 ENCOUNTER — Other Ambulatory Visit: Payer: Self-pay

## 2019-09-16 VITALS — BP 131/75 | HR 99 | Temp 97.7°F | Ht 62.0 in | Wt 113.0 lb

## 2019-09-16 DIAGNOSIS — N3001 Acute cystitis with hematuria: Secondary | ICD-10-CM | POA: Diagnosis not present

## 2019-09-16 DIAGNOSIS — R350 Frequency of micturition: Secondary | ICD-10-CM | POA: Diagnosis not present

## 2019-09-16 LAB — POCT URINALYSIS DIP (MANUAL ENTRY)
Bilirubin, UA: NEGATIVE
Glucose, UA: 100 mg/dL — AB
Nitrite, UA: POSITIVE — AB
Protein Ur, POC: 100 mg/dL — AB
Spec Grav, UA: 1.01 (ref 1.010–1.025)
Urobilinogen, UA: 1 E.U./dL
pH, UA: 5.5 (ref 5.0–8.0)

## 2019-09-16 LAB — POC MICROSCOPIC URINALYSIS (UMFC): Mucus: ABSENT

## 2019-09-16 MED ORDER — CEPHALEXIN 500 MG PO CAPS: 500.0000 mg | ORAL_CAPSULE | Freq: Two times a day (BID) | ORAL | 0 refills | Status: DC

## 2019-09-16 NOTE — Telephone Encounter (Signed)
PT IS NEEDING A REFILL CALLED IN FOR  spironolactone (ALDACTONE) 50 MG tablet [[id number]  PT JUST PT JUST SAW dR.sANTIAGO THIS AM   PLEAS ECALL IN REFILLS  fr

## 2019-09-16 NOTE — Progress Notes (Signed)
10/16/202010:45 AM  RAGENA FIOLA 1951-04-25, 68 y.o., female [id number]  Chief Complaint  Patient presents with  . Urinary Urgency    for 1 wk now with lots of pain    HPI:   Patient is a 68 y.o. female with past medical history significant for HTN, COPD, mood disorder, insomnia who presents today for concerns of UTI  Patient reports 5 days of urinary  urgency and dysuria No hematuria, fever or chills, nausea, vomiting, no abd pain, flank pain She has been taking azo  Last seen in Aug for similar concerns  Ur cx then e coli, low cfu, pan sensitive  Patient reports that she has been tested and is not allergic to [facility] She has had keflex in the past wo issues  Depression screen [facility] 2/9 09/16/2019 09/05/2019 07/29/2019  Decreased Interest 0 0 0  Down, Depressed, Hopeless 0 0 0  PHQ - 2 Score 0 0 0  Altered sleeping - - -  Tired, decreased energy - - -  Change in appetite - - -  Feeling bad or failure about yourself  - - -  Trouble concentrating - - -  Moving slowly or fidgety/restless - - -  Suicidal thoughts - - -  PHQ-9 Score - - -  Some recent data might be hidden    Fall Risk  09/16/2019 09/05/2019 07/29/2019 07/27/2019 07/15/2019  Falls in the past year? 1 1 1 1  0  Comment - - - - -  Number falls in past yr: 0 0 1 1 -  Injury with Fall? 0 1 1 0 0  Comment - - - - -  Risk for fall due to : Impaired mobility - History of fall(s) - -  Follow up - Falls evaluation completed Falls evaluation completed Falls evaluation completed;Education provided;Falls prevention discussed -     Allergies  Allergen Reactions  . Sulfa Drugs Cross Reactors Hives and Rash    Hives and rash  . Ceclor [Cefaclor] Hives    Tolerated ceftazidime December 2016  . Depakote Er [Divalproex Sodium Er] Other (See Comments)    Patient remarked that when she was taking this TWICE a day, she became "clumsy" and felt more prone to stumbling  . Escitalopram Oxalate Other (See Comments)    Pt does not  recall ever taking medication (??)  . Sertraline Hcl     Severe headache    Prior to Admission medications   Medication Sig Start Date End Date Taking? Authorizing Provider  amLODipine (NORVASC) 2.5 MG tablet Take 1 tablet (2.5 mg total) by mouth daily. 07/29/19  Yes Rutherford Guys, MD  aspirin EC 81 MG tablet Take 81-162 mg by mouth See admin instructions. 81 mg once a day "for heart health" and 162 mg one to two times a day as needed for headaches   Yes [provider]  budesonide-formoterol (SYMBICORT) 160-4.5 MCG/ACT inhaler Inhale 2 puffs into the lungs 2 (two) times daily. 09/19/17  Yes Patrecia Pour, Christean Grief, MD  calcium carbonate (TUMS EX) 750 MG chewable tablet Chew 1 tablet by mouth 2 (two) times daily as needed for heartburn.   Yes [provider]  divalproex (DEPAKOTE ER) 250 MG 24 hr tablet Take 3 tablets (750 mg total) by mouth at bedtime. 08/17/19  Yes Arfeen, Arlyce Harman, MD  fluticasone (FLONASE) 50 MCG/ACT nasal spray Place 2 sprays into both nostrils daily. 07/15/19  Yes Maximiano Coss, NP  HYDROcodone-acetaminophen (NORCO/VICODIN) 5-325 MG tablet Take 1 tablet by mouth  every 6 (six) hours as needed for moderate pain. 07/29/19  Yes Rutherford Guys, MD  hydrOXYzine (ATARAX/VISTARIL) 10 MG tablet Take 1 tablet (10 mg total) by mouth at bedtime as needed for anxiety. 08/17/19  Yes Arfeen, Arlyce Harman, MD  methocarbamol (ROBAXIN) 500 MG tablet Take 1 tablet (500 mg total) by mouth every 8 (eight) hours as needed for muscle spasms. 07/29/19  Yes Rutherford Guys, MD  nortriptyline (PAMELOR) 50 MG capsule TAKE ONE TO TWO CAPSULES BY MOUTH NIGHTLY 08/17/19  Yes Arfeen, Arlyce Harman, MD  spironolactone (ALDACTONE) 50 MG tablet Take 1 tablet (50 mg total) by mouth daily. Office visit needed for refills 03/11/19  Yes Rutherford Guys, MD  triamcinolone cream (KENALOG) 0.1 % Apply 1 application topically 2 (two) times daily. 09/11/18  Yes Posey Boyer, MD  venlafaxine (EFFEXOR) 75 MG tablet  Take 1 tablet (75 mg total) by mouth daily. 08/17/19 08/16/20 Yes Arfeen, Arlyce Harman, MD  VENTOLIN HFA 108 (90 Base) MCG/ACT inhaler TAKE 2 PUFFS BY MOUTH EVERY 6 HOURS AS NEEDED FOR WHEEZE OR SHORTNESS OF BREATH 05/20/19  Yes Rutherford Guys, MD    Past Medical History:  Diagnosis Date  . Anxiety   . Arthritis    "jooints" (09/18/2017)  . Asthma   . Bipolar disorder ([place])   . CAP (community acquired pneumonia) 09/18/2017  . Cat allergies   . Chronic bronchitis ([place])    "get it alot; maybe not q yr" (07/06/2014)  . Chronic lower back pain    "spurs and pinched nerves" (09/18/2017)  . COPD (chronic obstructive pulmonary disease) ([place])   . DDD (degenerative disc disease), lumbar   . Depression    psychiatrist Dr. Toy Care  . Diastolic dysfunction   . Emphysematous COPD ([place])    changes in right base along with patchy areas on last x-ray  . Falls frequently    "several in 2017; fell at dr's office yesterday" (09/18/2017)  . GERD (gastroesophageal reflux disease)   . HCAP (healthcare-associated pneumonia) 02/12/2016  . Heart murmur   . History of cardiovascular stress test 08/15/2004   EF of 70% -- Normal stress cardiolite.  There is no evidence of ischemia and there is normal LV function. -- [name]. MD  . History of echocardiogram 12/24/2006   Est. EF of 28-31% NORMAL LV SYSTOLIC FUNCTION WITH IMPAIRED RELAXATION -- MILD AORTIC SCLEROSIS -- NORMAL PALONARY ARTERY PRESSURE -- NO OLD ECHOS FOR COMPARISON -- [name], MD  . Hypertension    essential hypertension  . Migraine    "when I was having my periods; none since then" (09/18/2017)  . Necrotic pneumonia ([place])    recurrent/notes 02/12/2016  . Pneumonia    "several times since May 2015" (07/06/2014)  . Pollen allergies   . Tobacco abuse    smoking about 1/2 pk of cigarettes a day    Past Surgical History:  Procedure Laterality Date  . CAROTID STENT    . DILATION AND CURETTAGE OF UTERUS    . TUBAL LIGATION  1986     Social History   Tobacco Use  . Smoking status: Current Every Day Smoker    Packs/day: 0.50    Years: 46.00    Pack years: 23.00    Types: Cigarettes  . Smokeless tobacco: Never Used  Substance Use Topics  . Alcohol use: Yes    Alcohol/week: 0.0 standard drinks    Comment: 09/18/2017 "might have 1 drink/month; if that"    Family History  Problem Relation Age of Onset  . Stroke Mother   . Heart disease Father   . Hyperlipidemia Father   . Hypertension Father   . Breast cancer Maternal Aunt   . Asthma Maternal Grandmother     ROS Per hpi  OBJECTIVE:  Today's Vitals   09/16/19 1036  BP: 131/75  Pulse: 99  Temp: 97.7 F (36.5 C)  SpO2: 90%  Weight: 113 lb (51.3 kg)  Height: 5\' 2"  (1.575 m)   Body mass index is 20.67 kg/m.   Physical Exam Vitals signs and nursing note reviewed.  Constitutional:      Appearance: She is well-developed.  HENT:     Head: Normocephalic and atraumatic.     Mouth/Throat:     Pharynx: No oropharyngeal exudate.  Eyes:     General: No scleral icterus.    Conjunctiva/sclera: Conjunctivae normal.     Pupils: Pupils are equal, round, and reactive to light.  Neck:     Musculoskeletal: Neck supple.  Cardiovascular:     Rate and Rhythm: Normal rate and regular rhythm.     Heart sounds: Normal heart sounds. No murmur. No friction rub. No gallop.   Pulmonary:     Effort: Pulmonary effort is normal.     Breath sounds: Rhonchi present. No wheezing or rales.  Abdominal:     General: Bowel sounds are normal. There is no distension.     Palpations: Abdomen is soft.     Tenderness: There is abdominal tenderness (suprapubic). There is right CVA tenderness (minimally TTP). There is no guarding or rebound.  Skin:    General: Skin is warm and dry.  Neurological:     Mental Status: She is alert and oriented to person, place, and time.     Results for orders placed or performed in visit on 09/16/19 (from the past 24 hour(s))  POCT urinalysis  dipstick     Status: Abnormal   Collection Time: 09/16/19 10:47 AM  Result Value Ref Range   Color, UA orange (A) yellow   Clarity, UA cloudy (A) clear   Glucose, UA =100 (A) negative mg/dL   Bilirubin, UA negative negative   Ketones, POC UA trace (5) (A) negative mg/dL   Spec Grav, UA 1.010 1.010 - 1.025   Blood, UA large (A) negative   pH, UA 5.5 5.0 - 8.0   Protein Ur, POC =100 (A) negative mg/dL   Urobilinogen, UA 1.0 0.2 or 1.0 E.U./dL   Nitrite, UA Positive (A) Negative   Leukocytes, UA Large (3+) (A) Negative  POCT Microscopic Urinalysis ([facility])     Status: Abnormal   Collection Time: 09/16/19 11:09 AM  Result Value Ref Range   WBC,UR,HPF,POC Too numerous to count  (A) None WBC/hpf   RBC,UR,HPF,POC Many (A) None RBC/hpf   Bacteria None None, Too numerous to count   Mucus Absent Absent   Epithelial Cells, UR Per Microscopy Few (A) None, Too numerous to count cells/hpf    No results found.   ASSESSMENT and PLAN  1. Acute cystitis with hematuria Discussed supportive measures, new meds r/se/b and RTC precautions. Patient educational handout given. - Urine Culture  2. Frequent urination - POCT urinalysis dipstick - POCT Microscopic Urinalysis ([facility])  Other orders - cephALEXin (KEFLEX) 500 MG capsule; Take 1 capsule (500 mg total) by mouth 2 (two) times daily.  Return if symptoms worsen or fail to improve.    Rutherford Guys, MD [facility] [place], [place] [address]  Ph.  [phone] Fax [phone]

## 2019-09-16 NOTE — Patient Instructions (Addendum)
If you have lab work done today you will be contacted with your lab results within the next 2 weeks.  If you have not heard from [name] then please contact us. The fastest way to get your results is to register for My Chart.   IF you received an x-ray today, you will receive an invoice from [facility]. Please contact [facility] at [phone] with questions or concerns regarding your invoice.   IF you received labwork today, you will receive an invoice from [facility]. Please contact [facility] at [phone] with questions or concerns regarding your invoice.   Our billing staff will not be able to assist you with questions regarding bills from these companies.  You will be contacted with the lab results as soon as they are available. The fastest way to get your results is to activate your My Chart account. Instructions are located on the last page of this paperwork. If you have not heard from [name] regarding the results in 2 weeks, please contact this office.     Urinary Tract Infection, Adult A urinary tract infection (UTI) is an infection of any part of the urinary tract. The urinary tract includes:  The kidneys.  The ureters.  The bladder.  The urethra. These organs make, store, and get rid of pee (urine) in the body. What are the causes? This is caused by germs (bacteria) in your genital area. These germs grow and cause swelling (inflammation) of your urinary tract. What increases the risk? You are more likely to develop this condition if:  You have a small, thin tube (catheter) to drain pee.  You cannot control when you pee or poop (incontinence).  You are female, and: ? You use these methods to prevent pregnancy: ? A medicine that kills sperm (spermicide). ? A device that blocks sperm (diaphragm). ? You have low levels of a female hormone (estrogen). ? You are pregnant.  You have genes that add to your risk.  You are sexually active.  You take  antibiotic medicines.  You have trouble peeing because of: ? A prostate that is bigger than normal, if you are female. ? A blockage in the part of your body that drains pee from the bladder (urethra). ? A kidney stone. ? A nerve condition that affects your bladder (neurogenic bladder). ? Not getting enough to drink. ? Not peeing often enough.  You have other conditions, such as: ? Diabetes. ? A weak disease-fighting system (immune system). ? Sickle cell disease. ? Gout. ? Injury of the spine. What are the signs or symptoms? Symptoms of this condition include:  Needing to pee right away (urgently).  Peeing often.  Peeing small amounts often.  Pain or burning when peeing.  Blood in the pee.  Pee that smells bad or not like normal.  Trouble peeing.  Pee that is cloudy.  Fluid coming from the vagina, if you are female.  Pain in the belly or lower back. Other symptoms include:  Throwing up (vomiting).  No urge to eat.  Feeling mixed up (confused).  Being tired and grouchy (irritable).  A fever.  Watery poop (diarrhea). How is this treated? This condition may be treated with:  Antibiotic medicine.  Other medicines.  Drinking enough water. Follow these instructions at home:  Medicines  Take over-the-counter and prescription medicines only as told by your doctor.  If you were prescribed an antibiotic medicine, take it as told by your doctor. Do not stop taking it even if  you start to feel better. General instructions  Make sure you: ? Pee until your bladder is empty. ? Do not hold pee for a long time. ? Empty your bladder after sex. ? Wipe from front to back after pooping if you are a female. Use each tissue one time when you wipe.  Drink enough fluid to keep your pee pale yellow.  Keep all follow-up visits as told by your doctor. This is important. Contact a doctor if:  You do not get better after 1-2 days.  Your symptoms go away and then come  back. Get help right away if:  You have very bad back pain.  You have very bad pain in your lower belly.  You have a fever.  You are sick to your stomach (nauseous).  You are throwing up. Summary  A urinary tract infection (UTI) is an infection of any part of the urinary tract.  This condition is caused by germs in your genital area.  There are many risk factors for a UTI. These include having a small, thin tube to drain pee and not being able to control when you pee or poop.  Treatment includes antibiotic medicines for germs.  Drink enough fluid to keep your pee pale yellow. This information is not intended to replace advice given to you by your health care provider. Make sure you discuss any questions you have with your health care provider. Document Released: 05/05/2008 Document Revised: 11/04/2018 Document Reviewed: 05/27/2018 Elsevier Patient Education  2020 [employer].

## 2019-09-17 LAB — URINE CULTURE

## 2019-09-19 ENCOUNTER — Telehealth: Payer: Self-pay | Admitting: Family Medicine

## 2019-09-19 NOTE — Telephone Encounter (Signed)
Pt received phone call no documentation

## 2019-09-20 ENCOUNTER — Telehealth: Payer: Self-pay | Admitting: Family Medicine

## 2019-09-20 NOTE — Progress Notes (Signed)
Call pt, na, no answering machine.

## 2019-09-20 NOTE — Telephone Encounter (Signed)
rx refill spironolactone (ALDACTONE) 50 MG tablet  PHARMACY [id number] [name], [place] [phone] (Phone) [phone] (Fax)

## 2019-09-21 ENCOUNTER — Other Ambulatory Visit: Payer: Self-pay | Admitting: Family Medicine

## 2019-09-21 DIAGNOSIS — I10 Essential (primary) hypertension: Secondary | ICD-10-CM

## 2019-09-21 MED ORDER — SPIRONOLACTONE 50 MG PO TABS: 50.0000 mg | ORAL_TABLET | Freq: Every day | ORAL | 1 refills | Status: DC

## 2019-09-21 NOTE — Telephone Encounter (Signed)
Copied from [id number] [phone]. Topic: Quick Communication - Rx Refill/Question >> Sep 21, 2019 12:47 PM Rainey Pines A wrote: Medication:spironolactone (ALDACTONE) 50 MG tablet (Patient is completely out.)  Has the patient contacted their pharmacy? Yes (Agent: If no, request that the patient contact the pharmacy for the refill.) (Agent: If yes, when and what did the pharmacy advise?)Contact PCP  Preferred Pharmacy (with phone number or street name):[id number] [name], [place] [phone] (Phone) [phone] (Fax)    Agent: Please be advised that RX refills may take up to 3 business days. We ask that you follow-up with your pharmacy.

## 2019-09-22 ENCOUNTER — Other Ambulatory Visit (HOSPITAL_COMMUNITY): Payer: Self-pay | Admitting: Psychiatry

## 2019-09-22 DIAGNOSIS — F411 Generalized anxiety disorder: Secondary | ICD-10-CM

## 2019-09-22 MED ORDER — HYDROXYZINE HCL 10 MG PO TABS: 10.0000 mg | ORAL_TABLET | Freq: Every evening | ORAL | 0 refills | Status: DC | PRN

## 2019-09-23 NOTE — Telephone Encounter (Signed)
RX has already been sent on 09/21/19 to  [id number] [place], [place]

## 2019-10-05 NOTE — Telephone Encounter (Signed)
Rx was sent to pharmacy on 09/21/2019

## 2019-10-11 ENCOUNTER — Encounter: Payer: Self-pay | Admitting: Adult Health Nurse Practitioner

## 2019-10-11 ENCOUNTER — Ambulatory Visit (INDEPENDENT_AMBULATORY_CARE_PROVIDER_SITE_OTHER): Payer: Medicare Other | Admitting: Adult Health Nurse Practitioner

## 2019-10-11 ENCOUNTER — Ambulatory Visit (INDEPENDENT_AMBULATORY_CARE_PROVIDER_SITE_OTHER): Payer: Medicare Other

## 2019-10-11 ENCOUNTER — Other Ambulatory Visit: Payer: Self-pay

## 2019-10-11 VITALS — BP 143/70 | HR 86 | Temp 97.6°F | Resp 16 | Ht 60.0 in | Wt 118.0 lb

## 2019-10-11 DIAGNOSIS — F17210 Nicotine dependence, cigarettes, uncomplicated: Secondary | ICD-10-CM | POA: Diagnosis not present

## 2019-10-11 DIAGNOSIS — S9001XA Contusion of right ankle, initial encounter: Secondary | ICD-10-CM

## 2019-10-11 DIAGNOSIS — Z23 Encounter for immunization: Secondary | ICD-10-CM

## 2019-10-11 DIAGNOSIS — Z87891 Personal history of nicotine dependence: Secondary | ICD-10-CM | POA: Diagnosis not present

## 2019-10-11 DIAGNOSIS — J069 Acute upper respiratory infection, unspecified: Secondary | ICD-10-CM

## 2019-10-11 DIAGNOSIS — Z1231 Encounter for screening mammogram for malignant neoplasm of breast: Secondary | ICD-10-CM | POA: Diagnosis not present

## 2019-10-11 DIAGNOSIS — W050XXA Fall from non-moving wheelchair, initial encounter: Secondary | ICD-10-CM

## 2019-10-11 DIAGNOSIS — M8588 Other specified disorders of bone density and structure, other site: Secondary | ICD-10-CM | POA: Diagnosis not present

## 2019-10-11 DIAGNOSIS — W19XXXA Unspecified fall, initial encounter: Secondary | ICD-10-CM

## 2019-10-11 DIAGNOSIS — D696 Thrombocytopenia, unspecified: Secondary | ICD-10-CM | POA: Diagnosis not present

## 2019-10-11 DIAGNOSIS — S8001XA Contusion of right knee, initial encounter: Secondary | ICD-10-CM | POA: Diagnosis not present

## 2019-10-11 DIAGNOSIS — Z720 Tobacco use: Secondary | ICD-10-CM

## 2019-10-11 DIAGNOSIS — Z136 Encounter for screening for cardiovascular disorders: Secondary | ICD-10-CM

## 2019-10-11 DIAGNOSIS — S8991XA Unspecified injury of right lower leg, initial encounter: Secondary | ICD-10-CM

## 2019-10-11 DIAGNOSIS — F319 Bipolar disorder, unspecified: Secondary | ICD-10-CM

## 2019-10-11 DIAGNOSIS — J439 Emphysema, unspecified: Secondary | ICD-10-CM

## 2019-10-11 DIAGNOSIS — R5383 Other fatigue: Secondary | ICD-10-CM

## 2019-10-11 DIAGNOSIS — I6523 Occlusion and stenosis of bilateral carotid arteries: Secondary | ICD-10-CM

## 2019-10-11 DIAGNOSIS — J449 Chronic obstructive pulmonary disease, unspecified: Secondary | ICD-10-CM | POA: Diagnosis not present

## 2019-10-11 DIAGNOSIS — Y92009 Unspecified place in unspecified non-institutional (private) residence as the place of occurrence of the external cause: Secondary | ICD-10-CM

## 2019-10-11 HISTORY — DX: Unspecified place in unspecified non-institutional (private) residence as the place of occurrence of the external cause: W19.XXXA

## 2019-10-11 HISTORY — DX: Fall from non-moving wheelchair, initial encounter: W05.0XXA

## 2019-10-11 HISTORY — DX: Bipolar disorder, unspecified: F31.9

## 2019-10-11 HISTORY — DX: Contusion of right knee, initial encounter: S80.01XA

## 2019-10-11 HISTORY — DX: Nicotine dependence, cigarettes, uncomplicated: F17.210

## 2019-10-11 HISTORY — DX: Unspecified place in unspecified non-institutional (private) residence as the place of occurrence of the external cause: Y92.009

## 2019-10-11 MED ORDER — HYDROCODONE-ACETAMINOPHEN 5-325 MG PO TABS: 1.0000 | ORAL_TABLET | Freq: Four times a day (QID) | ORAL | 0 refills | Status: DC | PRN

## 2019-10-11 MED ORDER — TIOTROPIUM BROMIDE MONOHYDRATE 18 MCG IN CAPS: 18.0000 ug | ORAL_CAPSULE | Freq: Every day | RESPIRATORY_TRACT | 12 refills | Status: DC

## 2019-10-11 NOTE — Patient Instructions (Signed)
° ° ° °  If you have lab work done today you will be contacted with your lab results within the next 2 weeks.  If you have not heard from us then please contact us. The fastest way to get your results is to register for My Chart. ° ° °IF you received an x-ray today, you will receive an invoice from [facility]. Please contact [facility] at [phone] with questions or concerns regarding your invoice.  ° °IF you received labwork today, you will receive an invoice from [facility]. Please contact [facility] at [phone] with questions or concerns regarding your invoice.  ° °Our billing staff will not be able to assist you with questions regarding bills from these companies. ° °You will be contacted with the lab results as soon as they are available. The fastest way to get your results is to activate your My Chart account. Instructions are located on the last page of this paperwork. If you have not heard from us regarding the results in 2 weeks, please contact this office. °  ° ° ° °

## 2019-10-12 ENCOUNTER — Telehealth: Payer: Self-pay | Admitting: *Deleted

## 2019-10-12 LAB — CBC WITH DIFFERENTIAL/PLATELET
Basophils Absolute: 0 10*3/uL (ref 0.0–0.2)
Basos: 0 %
EOS (ABSOLUTE): 0.3 10*3/uL (ref 0.0–0.4)
Eos: 5 %
Hematocrit: 41.1 % (ref 34.0–46.6)
Hemoglobin: 14 g/dL (ref 11.1–15.9)
Immature Grans (Abs): 0 10*3/uL (ref 0.0–0.1)
Immature Granulocytes: 1 %
Lymphocytes Absolute: 1.3 10*3/uL (ref 0.7–3.1)
Lymphs: 21 %
MCH: 29.9 pg (ref 26.6–33.0)
MCHC: 34.1 g/dL (ref 31.5–35.7)
MCV: 88 fL (ref 79–97)
Monocytes Absolute: 0.5 10*3/uL (ref 0.1–0.9)
Monocytes: 7 %
Neutrophils Absolute: 4.1 10*3/uL (ref 1.4–7.0)
Neutrophils: 66 %
Platelets: 100 10*3/uL — CL (ref 150–450)
RBC: 4.69 x10E6/uL (ref 3.77–5.28)
RDW: 13.5 % (ref 11.7–15.4)
WBC: 6.2 10*3/uL (ref 3.4–10.8)

## 2019-10-12 LAB — CMP14+EGFR
ALT: 20 IU/L (ref 0–32)
AST: 30 IU/L (ref 0–40)
Albumin/Globulin Ratio: 1.4 (ref 1.2–2.2)
Albumin: 4.4 g/dL (ref 3.8–4.8)
Alkaline Phosphatase: 154 IU/L — ABNORMAL HIGH (ref 39–117)
BUN/Creatinine Ratio: 14 (ref 12–28)
BUN: 10 mg/dL (ref 8–27)
Bilirubin Total: 0.4 mg/dL (ref 0.0–1.2)
CO2: 21 mmol/L (ref 20–29)
Calcium: 9.4 mg/dL (ref 8.7–10.3)
Chloride: 98 mmol/L (ref 96–106)
Creatinine, Ser: 0.69 mg/dL (ref 0.57–1.00)
GFR calc Af Amer: 103 mL/min/{1.73_m2} (ref >59)
GFR calc non Af Amer: 89 mL/min/{1.73_m2} (ref >59)
Globulin, Total: 3.1 g/dL (ref 1.5–4.5)
Glucose: 71 mg/dL (ref 65–99)
Potassium: 4.3 mmol/L (ref 3.5–5.2)
Sodium: 137 mmol/L (ref 134–144)
Total Protein: 7.5 g/dL (ref 6.0–8.5)

## 2019-10-12 LAB — LIPID PANEL
Chol/HDL Ratio: 2.3 ratio (ref 0.0–4.4)
Cholesterol, Total: 165 mg/dL (ref 100–199)
HDL: 72 mg/dL (ref >39)
LDL Chol Calc (NIH): 77 mg/dL (ref 0–99)
Triglycerides: 87 mg/dL (ref 0–149)
VLDL Cholesterol Cal: 16 mg/dL (ref 5–40)

## 2019-10-12 LAB — VALPROIC ACID LEVEL: Valproic Acid Lvl: 71 ug/mL (ref 50–100)

## 2019-10-12 LAB — VITAMIN D 25 HYDROXY (VIT D DEFICIENCY, FRACTURES): Vit D, 25-Hydroxy: 9.3 ng/mL — ABNORMAL LOW (ref 30.0–100.0)

## 2019-10-12 NOTE — Telephone Encounter (Addendum)
Pt questing  the xrays results for the Rt knee---Notified pt the X-rays result is normal, no fracture but cannot see if any damage to ligament but  will referral to orthopedic by Jens Som. Pt understood understood without questions.  Also, pt stated having yellowish mucus drainage and running fever over 100 degree today. Recommended  pt --with the symptoms will order the lab today and needed to get tested for coronavirus  at the [facility] by Judson Roch--. Pt declined due to not having transportation and her husband have to work.

## 2019-10-12 NOTE — Progress Notes (Signed)
Error

## 2019-10-13 ENCOUNTER — Telehealth: Payer: Self-pay

## 2019-10-13 DIAGNOSIS — I10 Essential (primary) hypertension: Secondary | ICD-10-CM

## 2019-10-13 NOTE — Telephone Encounter (Signed)
Pt. Called to report that the aldactone prescribed for her had been damaged and crushed. Pt. Called requesting a refill to replace it

## 2019-10-14 ENCOUNTER — Ambulatory Visit: Payer: Medicare Other | Admitting: Specialist

## 2019-10-14 MED ORDER — VITAMIN D (ERGOCALCIFEROL) 1.25 MG (50000 UNIT) PO CAPS: 50000.0000 [IU] | ORAL_CAPSULE | ORAL | 3 refills | Status: DC

## 2019-10-14 NOTE — Progress Notes (Signed)
Established Patient Office Visit  Subjective:  Patient ID: Catherine Kelley, female    DOB: 10/29/1951  Age: 68 y.o. MRN: [id number]  CC:  Chief Complaint  Patient presents with  . Injury    Rt knee--frontal-is painful, bruise (blue/purple) discoloration, swelling, warm sensation. Fell down this morning-trip on the walker.Catherine Kelley    HPI [name] presents for a fall. This morning she tripped on a walker that she and her husband use to dry clothes.  Fell on her right knee with difficulty walking and significant bruising.  Painful to touch and difficulty with weight bearing.  Bruising throughout knee.  Hx of smoking 1ppd.    In addition, there are several other health problems the patient needs following up for and that have not been recently screened.  She is due for a mammogram and Dexa.  She is a smoker and frail.  High risk for fracture if she falls.    Endorses depression but is bipolar.  She sees a psychiatrist and is on Depakote.  NO recent levels.  Does not feel like her medication is at an appropriate level.   Reports that she cries a lot and her husband has threatened to leave her.  She has asked him to say and that she will get better.  We discussed the fact that despite her husband's unwillingness to deal with her medical problems, it is important that she follow up on her medical problems as they could be debiliating given her health.     Previous labs show thrombycytopenia in which a smear was done showing no abnormality.  Potential for autoimmune component per [name].  Will recheck today.    COPD:  Not optimized on medications.  Was on Spiriva at some point.  Discussed need to add this for more control over COPD.  Not ready to d/c tobacco use yet.  CXR ordered today personally reviewed below.    After the visit, patient called and endorsed some congestion and sore throat with cough.  A Coronavirus test was ordered.    Past Medical History:  Diagnosis Date  . Anxiety   .  Arthritis    "jooints" (09/18/2017)  . Asthma   . Bipolar disorder ([place])   . Bipolar I disorder ([place]) 10/11/2019  . CAP (community acquired pneumonia) 09/18/2017  . Cat allergies   . Chronic bronchitis ([place])    "get it alot; maybe not q yr" (07/06/2014)  . Chronic lower back pain    "spurs and pinched nerves" (09/18/2017)  . COPD (chronic obstructive pulmonary disease) ([place])   . DDD (degenerative disc disease), lumbar   . Depression    psychiatrist Dr. Toy Care  . Diastolic dysfunction   . Emphysematous COPD ([place])    changes in right base along with patchy areas on last x-ray  . Fall as cause of accidental injury at home as place of occurrence 10/11/2019  . Fall involving wheelchair as cause of accidental injury 10/11/2019  . Falls frequently    "several in 2017; fell at dr's office yesterday" (09/18/2017)  . GERD (gastroesophageal reflux disease)   . HCAP (healthcare-associated pneumonia) 02/12/2016  . Heart murmur   . History of cardiovascular stress test 08/15/2004   EF of 70% -- Normal stress cardiolite.  There is no evidence of ischemia and there is normal LV function. -- [name]. MD  . History of echocardiogram 12/24/2006   Est. EF of 19-50% NORMAL LV SYSTOLIC FUNCTION WITH IMPAIRED RELAXATION -- MILD AORTIC  [place] PRESSURE -- NO OLD ECHOS FOR COMPARISON -- [name], MD  . Hypertension    essential hypertension  . Migraine    "when I was having my periods; none since then" (09/18/2017)  . Necrotic pneumonia ([place])    recurrent/notes 02/12/2016  . Pneumonia    "several times since May 2015" (07/06/2014)  . Pollen allergies   . Tobacco abuse    smoking about 1/2 pk of cigarettes a day  . Tobacco dependence due to cigarettes 10/11/2019  . Traumatic hematoma of knee, right, initial encounter 10/11/2019    Past Surgical History:  Procedure Laterality Date  . CAROTID STENT    . DILATION AND CURETTAGE OF UTERUS    . TUBAL LIGATION   1986    Family History  Problem Relation Age of Onset  . Stroke Mother   . Heart disease Father   . Hyperlipidemia Father   . Hypertension Father   . Breast cancer Maternal Aunt   . Asthma Maternal Grandmother     Social History   Socioeconomic History  . Marital status: Married    Spouse name: richard  . Number of children: 2  . Years of education: Not on file  . Highest education level: Associate degree: occupational, Hotel manager, or vocational program  Occupational History  . Occupation: Chemical engineer: [employer]    Comment: [employer]  Social Needs  . Financial resource strain: Somewhat hard  . Food insecurity    Worry: Sometimes true    Inability: Sometimes true  . Transportation needs    Medical: No    Non-medical: No  Tobacco Use  . Smoking status: Current Every Day Smoker    Packs/day: 0.50    Years: 46.00    Pack years: 23.00    Types: Cigarettes  . Smokeless tobacco: Never Used  Substance and Sexual Activity  . Alcohol use: Yes    Alcohol/week: 0.0 standard drinks    Comment: 09/18/2017 "might have 1 drink/month; if that"  . Drug use: No  . Sexual activity: Not Currently  Lifestyle  . Physical activity    Days per week: 0 days    Minutes per session: 0 min  . Stress: Very much  Relationships  . Social Herbalist on phone: Not on file    Gets together: Not on file    Attends religious service: More than 4 times per year    Active member of club or organization: No    Attends meetings of clubs or organizations: Never    Relationship status: Married  . Intimate partner violence    Fear of current or ex partner: No    Emotionally abused: No    Physically abused: No    Forced sexual activity: No  Other Topics Concern  . Not on file  Social History Narrative   Patient currently lives with her husband.    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 No bird or hot tub exposure. No mold exposure. No recent travel.    Outpatient Medications Prior to Visit  Medication Sig Dispense Refill  . amLODipine (NORVASC) 2.5 MG tablet Take 1 tablet (2.5 mg total) by mouth daily. 90 tablet 1  . aspirin EC 81 MG tablet Take 81-162 mg by mouth See admin instructions. 81 mg once a day "for  heart health" and 162 mg one to two times a day as needed for headaches    . budesonide-formoterol (SYMBICORT) 160-4.5 MCG/ACT inhaler Inhale 2 puffs into the lungs 2 (two) times daily. 1 Inhaler 12  . calcium carbonate (TUMS EX) 750 MG chewable tablet Chew 1 tablet by mouth 2 (two) times daily as needed for heartburn.    . divalproex (DEPAKOTE ER) 250 MG 24 hr tablet Take 3 tablets (750 mg total) by mouth at bedtime. 90 tablet 2  . fluticasone (FLONASE) 50 MCG/ACT nasal spray Place 2 sprays into both nostrils daily. 16 g 6  . hydrOXYzine (ATARAX/VISTARIL) 10 MG tablet Take 1 tablet (10 mg total) by mouth at bedtime as needed for anxiety. 30 tablet 0  . methocarbamol (ROBAXIN) 500 MG tablet Take 1 tablet (500 mg total) by mouth every 8 (eight) hours as needed for muscle spasms. 30 tablet 1  . nortriptyline (PAMELOR) 50 MG capsule TAKE ONE TO TWO CAPSULES BY MOUTH NIGHTLY 180 capsule 0  . spironolactone (ALDACTONE) 50 MG tablet Take 1 tablet (50 mg total) by mouth daily. Office visit needed for refills 90 tablet 1  . triamcinolone cream (KENALOG) 0.1 % Apply 1 application topically 2 (two) times daily. 30 g 1  . venlafaxine (EFFEXOR) 75 MG tablet Take 1 tablet (75 mg total) by mouth daily. 90 tablet 0  . VENTOLIN HFA 108 (90 Base) MCG/ACT inhaler TAKE 2 PUFFS BY MOUTH EVERY 6 HOURS AS NEEDED FOR WHEEZE OR SHORTNESS OF BREATH 18 g 5  . HYDROcodone-acetaminophen (NORCO/VICODIN) 5-325 MG tablet Take 1 tablet by mouth every 6 (six) hours as needed for moderate pain. 20 tablet 0  . cephALEXin (KEFLEX) 500 MG capsule Take 1 capsule (500 mg total) by mouth 2 (two) times daily.  14 capsule 0   No facility-administered medications prior to visit.     Allergies  Allergen Reactions  . Sulfa Drugs Cross Reactors Hives and Rash    Hives and rash  . Ceclor [Cefaclor] Hives    Tolerated ceftazidime December 2016  . Depakote Er [Divalproex Sodium Er] Other (See Comments)    Patient remarked that when she was taking this TWICE a day, she became "clumsy" and felt more prone to stumbling  . Escitalopram Oxalate Other (See Comments)    Pt does not recall ever taking medication (??)  . Sertraline Hcl     Severe headache    ROS Review of Systems  Constitutional: Positive for fatigue.  HENT: Positive for congestion and postnasal drip.   Cardiovascular: Negative for chest pain.  Musculoskeletal: Positive for arthralgias, gait problem, joint swelling and myalgias.  Hematological: Bruises/bleeds easily.  Psychiatric/Behavioral: Positive for dysphoric mood. Negative for confusion and self-injury. The patient is nervous/anxious.         Objective:    Physical Exam  Constitutional: Vital signs are normal. She appears cachectic. She appears distressed.  Pulmonary/Chest: Effort normal. She has decreased breath sounds in the right middle field, the right lower field and the left lower field.  Nursing note and vitals reviewed. Physical Exam  Constitutional: Oriented to person, place, and time. Appears well-developed and well-nourished.  HENT:  Head: Normocephalic and atraumatic.  Eyes: Conjunctivae and EOM are normal.  Cardiovascular: Normal rate, regular rhythm, normal heart sounds and intact distal pulses.  No murmur heard. Pulmonary/Chest:  Neurological: Is alert and oriented to person, place, and time.  Musculoskeletal:  Right knee with eccymosis, effusion and limited ROM.  Tender to touch around patella and surrounding  areas.  Limited lateral and medial movement with tenderness along MCL and LCL.  Gait:  Unstable.  Shuffling.  Skin: Skin is warm. Capillary  refill takes less than 2 seconds.  Psychiatric:     Anxious.  Nervous.              BP (!) 143/70 (BP Location: Right Arm, Patient Position: Sitting, Cuff Size: Normal)   Pulse 86   Temp 97.6 F (36.4 C)   Resp 16   Ht 5' (1.524 m)   Wt 118 lb (53.5 kg)   SpO2 95%   BMI 23.05 kg/m  Wt Readings from Last 3 Encounters:  10/11/19 118 lb (53.5 kg)  09/16/19 113 lb (51.3 kg)  09/05/19 117 lb (53.1 kg)    CXR:  Personally reviewed with decrease in lung volumes to right and some scatterred scarring consistent with emphysema.    Right knee:  Pre-patellar swelling without evidence of fracture.    Assessment & Plan:    1. Acute upper respiratory infection   2. Needs flu shot   3. Tobacco use   4. Pulmonary emphysema, unspecified emphysema type ([place])   5. Personal history of nicotine dependence    6. Osteopenia of other site   7. Thrombocytopenia ([place])   8. Bipolar I disorder ([place])   9. Encounter for screening mammogram for malignant neoplasm of breast    10. Encounter for screening for cardiovascular disorders    11. Knee injury, right, initial encounter   12. Bilateral carotid artery stenosis   13. Stage 2 moderate COPD by GOLD classification ([place])   14. Tobacco dependence due to cigarettes   15. Fall as cause of accidental injury in home as place of occurrence, initial encounter   3. Other fatigue   17. Traumatic hematoma of knee, right, initial encounter   18. Traumatic hematoma of ankle, right, initial encounter    Orders Placed This Encounter  Procedures  . Novel Coronavirus, NAA ([facility])  . CT CHEST LUNG CANCER SCREENING LOW DOSE WO CONTRAST  . DG Chest 2 View  . DG Bone Density  . MM Digital Screening  . DG Knee 1-2 Views Right  . Flu Vaccine QUAD High Dose([facility])  . Vitamin D 25 (osteoporosis screening)  . CMP14+EGFR  . Valproic acid level  . CBC with Differential/Platelet  . Lipid panel  . Ambulatory referral to Psychology  . Ambulatory referral to  Cardiology  . Ambulatory referral to Orthopedic Surgery   Vitamin D. Deficiency:  Started on Ergocalciferol 50,000 units.  Plts.  Returned at 100.  She is taking 2 ASA 81 mg.  Recommended decreasing to 1.  Needs asap ortho appointment.  Depakote level:  71.   All labs communicated to patient and prescriptions explakned.  Patient is aware.  Will have her f/u with [name].   A total of  45 minutes were spent face-to-face with the patient during this encounter and over half of that time was spent on counseling and coordination of care.

## 2019-10-17 ENCOUNTER — Telehealth: Payer: Self-pay | Admitting: Adult Health Nurse Practitioner

## 2019-10-17 ENCOUNTER — Encounter: Payer: Self-pay | Admitting: Registered Nurse

## 2019-10-17 NOTE — Telephone Encounter (Signed)
Pt is not sure what happened, but she is thinking she told a lady who called her from an ortho referral and she cancelled making an appointment. She's not even sure. I'm not sure if you can do something, she is in a lot of pain. Please advise at [phone]

## 2019-10-18 ENCOUNTER — Other Ambulatory Visit: Payer: Self-pay

## 2019-10-18 ENCOUNTER — Encounter: Payer: Self-pay | Admitting: Adult Health Nurse Practitioner

## 2019-10-18 ENCOUNTER — Ambulatory Visit (INDEPENDENT_AMBULATORY_CARE_PROVIDER_SITE_OTHER): Payer: Medicare Other | Admitting: Adult Health Nurse Practitioner

## 2019-10-18 VITALS — BP 149/80 | HR 86 | Temp 97.6°F | Resp 12 | Ht 63.0 in | Wt 117.0 lb

## 2019-10-18 DIAGNOSIS — L03115 Cellulitis of right lower limb: Secondary | ICD-10-CM | POA: Diagnosis not present

## 2019-10-18 DIAGNOSIS — L853 Xerosis cutis: Secondary | ICD-10-CM | POA: Diagnosis not present

## 2019-10-18 DIAGNOSIS — W19XXXD Unspecified fall, subsequent encounter: Secondary | ICD-10-CM | POA: Diagnosis not present

## 2019-10-18 DIAGNOSIS — S8001XA Contusion of right knee, initial encounter: Secondary | ICD-10-CM

## 2019-10-18 DIAGNOSIS — Z720 Tobacco use: Secondary | ICD-10-CM

## 2019-10-18 DIAGNOSIS — Y92009 Unspecified place in unspecified non-institutional (private) residence as the place of occurrence of the external cause: Secondary | ICD-10-CM

## 2019-10-18 DIAGNOSIS — S8001XD Contusion of right knee, subsequent encounter: Secondary | ICD-10-CM

## 2019-10-18 MED ORDER — DOXYCYCLINE HYCLATE 100 MG PO TABS: 100.0000 mg | ORAL_TABLET | Freq: Two times a day (BID) | ORAL | 0 refills | Status: DC

## 2019-10-18 MED ORDER — TRIAMCINOLONE ACETONIDE 0.1 % EX CREA: 1.0000 "application " | TOPICAL_CREAM | Freq: Two times a day (BID) | CUTANEOUS | 1 refills | Status: DC

## 2019-10-20 NOTE — Telephone Encounter (Signed)
pt states her leg looks worse today and she is going to get her husband to take her to ED when he gets home.at 5 pm.  Pt canceling her appt for tomorrow

## 2019-10-21 ENCOUNTER — Inpatient Hospital Stay (HOSPITAL_COMMUNITY)
Admission: EM | Admit: 2019-10-21 | Discharge: 2019-10-26 | DRG: 603 | Disposition: A | Discharge status: Home Health | Payer: Medicare Other | Attending: Internal Medicine | Admitting: Internal Medicine

## 2019-10-21 ENCOUNTER — Ambulatory Visit: Payer: Medicare Other | Admitting: Adult Health Nurse Practitioner

## 2019-10-21 ENCOUNTER — Encounter (HOSPITAL_COMMUNITY): Payer: Self-pay | Admitting: Emergency Medicine

## 2019-10-21 ENCOUNTER — Emergency Department (HOSPITAL_COMMUNITY): Payer: Medicare Other

## 2019-10-21 ENCOUNTER — Other Ambulatory Visit: Payer: Self-pay

## 2019-10-21 DIAGNOSIS — Z8249 Family history of ischemic heart disease and other diseases of the circulatory system: Secondary | ICD-10-CM

## 2019-10-21 DIAGNOSIS — J449 Chronic obstructive pulmonary disease, unspecified: Secondary | ICD-10-CM | POA: Diagnosis present

## 2019-10-21 DIAGNOSIS — W010XXA Fall on same level from slipping, tripping and stumbling without subsequent striking against object, initial encounter: Secondary | ICD-10-CM | POA: Diagnosis present

## 2019-10-21 DIAGNOSIS — Z7951 Long term (current) use of inhaled steroids: Secondary | ICD-10-CM

## 2019-10-21 DIAGNOSIS — F319 Bipolar disorder, unspecified: Secondary | ICD-10-CM | POA: Diagnosis present

## 2019-10-21 DIAGNOSIS — R600 Localized edema: Secondary | ICD-10-CM | POA: Diagnosis not present

## 2019-10-21 DIAGNOSIS — I10 Essential (primary) hypertension: Secondary | ICD-10-CM | POA: Diagnosis not present

## 2019-10-21 DIAGNOSIS — L03115 Cellulitis of right lower limb: Principal | ICD-10-CM | POA: Diagnosis present

## 2019-10-21 DIAGNOSIS — J439 Emphysema, unspecified: Secondary | ICD-10-CM | POA: Diagnosis present

## 2019-10-21 DIAGNOSIS — Z825 Family history of asthma and other chronic lower respiratory diseases: Secondary | ICD-10-CM

## 2019-10-21 DIAGNOSIS — Z7982 Long term (current) use of aspirin: Secondary | ICD-10-CM

## 2019-10-21 DIAGNOSIS — Z882 Allergy status to sulfonamides status: Secondary | ICD-10-CM

## 2019-10-21 DIAGNOSIS — S8991XA Unspecified injury of right lower leg, initial encounter: Secondary | ICD-10-CM | POA: Diagnosis not present

## 2019-10-21 DIAGNOSIS — S99921A Unspecified injury of right foot, initial encounter: Secondary | ICD-10-CM | POA: Diagnosis not present

## 2019-10-21 DIAGNOSIS — D696 Thrombocytopenia, unspecified: Secondary | ICD-10-CM | POA: Diagnosis not present

## 2019-10-21 DIAGNOSIS — S8001XA Contusion of right knee, initial encounter: Secondary | ICD-10-CM | POA: Diagnosis present

## 2019-10-21 DIAGNOSIS — Z20828 Contact with and (suspected) exposure to other viral communicable diseases: Secondary | ICD-10-CM | POA: Diagnosis not present

## 2019-10-21 DIAGNOSIS — Z91040 Latex allergy status: Secondary | ICD-10-CM

## 2019-10-21 DIAGNOSIS — Z888 Allergy status to other drugs, medicaments and biological substances status: Secondary | ICD-10-CM

## 2019-10-21 DIAGNOSIS — M7989 Other specified soft tissue disorders: Secondary | ICD-10-CM | POA: Diagnosis not present

## 2019-10-21 DIAGNOSIS — S61012A Laceration without foreign body of left thumb without damage to nail, initial encounter: Secondary | ICD-10-CM | POA: Diagnosis not present

## 2019-10-21 DIAGNOSIS — Z881 Allergy status to other antibiotic agents status: Secondary | ICD-10-CM

## 2019-10-21 DIAGNOSIS — R509 Fever, unspecified: Secondary | ICD-10-CM | POA: Diagnosis not present

## 2019-10-21 DIAGNOSIS — Z9109 Other allergy status, other than to drugs and biological substances: Secondary | ICD-10-CM

## 2019-10-21 DIAGNOSIS — Z79899 Other long term (current) drug therapy: Secondary | ICD-10-CM

## 2019-10-21 DIAGNOSIS — L0390 Cellulitis, unspecified: Secondary | ICD-10-CM | POA: Insufficient documentation

## 2019-10-21 DIAGNOSIS — F17210 Nicotine dependence, cigarettes, uncomplicated: Secondary | ICD-10-CM | POA: Diagnosis present

## 2019-10-21 DIAGNOSIS — T1490XA Injury, unspecified, initial encounter: Secondary | ICD-10-CM

## 2019-10-21 LAB — COMPREHENSIVE METABOLIC PANEL
ALT: 23 U/L (ref 0–44)
AST: 32 U/L (ref 15–41)
Albumin: 3.5 g/dL (ref 3.5–5.0)
Alkaline Phosphatase: 141 U/L — ABNORMAL HIGH (ref 38–126)
Anion gap: 11 (ref 5–15)
BUN: 11 mg/dL (ref 8–23)
CO2: 24 mmol/L (ref 22–32)
Calcium: 9 mg/dL (ref 8.9–10.3)
Chloride: 100 mmol/L (ref 98–111)
Creatinine, Ser: 0.94 mg/dL (ref 0.44–1.00)
GFR calc Af Amer: >60 mL/min (ref >60)
GFR calc non Af Amer: >60 mL/min (ref >60)
Glucose, Bld: 89 mg/dL (ref 70–99)
Potassium: 3.7 mmol/L (ref 3.5–5.1)
Sodium: 135 mmol/L (ref 135–145)
Total Bilirubin: 0.7 mg/dL (ref 0.3–1.2)
Total Protein: 7.7 g/dL (ref 6.5–8.1)

## 2019-10-21 LAB — CBC WITH DIFFERENTIAL/PLATELET
Abs Immature Granulocytes: 0.05 10*3/uL (ref 0.00–0.07)
Basophils Absolute: 0 10*3/uL (ref 0.0–0.1)
Basophils Relative: 0 %
Eosinophils Absolute: 0.2 10*3/uL (ref 0.0–0.5)
Eosinophils Relative: 4 %
HCT: 42.2 % (ref 36.0–46.0)
Hemoglobin: 13.7 g/dL (ref 12.0–15.0)
Immature Granulocytes: 1 %
Lymphocytes Relative: 25 %
Lymphs Abs: 1.2 10*3/uL (ref 0.7–4.0)
MCH: 30 pg (ref 26.0–34.0)
MCHC: 32.5 g/dL (ref 30.0–36.0)
MCV: 92.3 fL (ref 80.0–100.0)
Monocytes Absolute: 0.4 10*3/uL (ref 0.1–1.0)
Monocytes Relative: 9 %
Neutro Abs: 2.9 10*3/uL (ref 1.7–7.7)
Neutrophils Relative %: 61 %
Platelets: DECREASED 10*3/uL (ref 150–400)
RBC: 4.57 MIL/uL (ref 3.87–5.11)
RDW: 13.7 % (ref 11.5–15.5)
WBC: 4.8 10*3/uL (ref 4.0–10.5)
nRBC: 0 % (ref 0.0–0.2)

## 2019-10-21 LAB — LACTIC ACID, PLASMA: Lactic Acid, Venous: 1.2 mmol/L (ref 0.5–1.9)

## 2019-10-21 MED ORDER — HYDROCODONE-ACETAMINOPHEN 5-325 MG PO TABS
1.0000 | ORAL_TABLET | Freq: Once | ORAL | Status: AC
  Administered 2019-10-21: 1 via ORAL
  Filled 2019-10-21: qty 1

## 2019-10-21 MED ORDER — CLINDAMYCIN PHOSPHATE 600 MG/50ML IV SOLN
600.0000 mg | Freq: Once | INTRAVENOUS | Status: AC
  Administered 2019-10-21: 600 mg via INTRAVENOUS
  Filled 2019-10-21: qty 50

## 2019-10-21 NOTE — ED Provider Notes (Signed)
[facility] DEPARTMENT Provider Note   CSN: [id number] Arrival date & time: 10/21/19  1837     History   Chief Complaint Chief Complaint  Patient presents with   Leg Infection    HPI Catherine Kelley is a 68 y.o. female.     HPI  Ms. Abraha is a 68 year old female with PMH of asthma, bipolar disorder, GERD who presents to the ED with concern for R leg infection. Patient reports a little over a week ago she slipped in her laundry room and fell onto her right knee.  Patient reports since that time she has had worsening swelling and redness of her right leg.  Patient was started on doxycycline on 11/17 by her primary doctor.  She reports the redness and swelling has worsened since that time and got worse today.  Patient had some subjective fever and chills.  No other systemic symptoms.  Patient has any numbness or weakness.  She has been ambulatory but states her leg is painful.  No prior history of known blood clots.  No chest pain or difficulty breathing.  Past Medical History:  Diagnosis Date   Anxiety    Arthritis    "jooints" (09/18/2017)   Asthma    Bipolar disorder ([place])    Bipolar I disorder ([place]) 10/11/2019   CAP (community acquired pneumonia) 09/18/2017   Cat allergies    Chronic bronchitis ([place])    "get it alot; maybe not q yr" (07/06/2014)   Chronic lower back pain    "spurs and pinched nerves" (09/18/2017)   COPD (chronic obstructive pulmonary disease) ([place])    DDD (degenerative disc disease), lumbar    Depression    psychiatrist Dr. Toy Care   Diastolic dysfunction    Emphysematous COPD ([place])    changes in right base along with patchy areas on last x-ray   Fall as cause of accidental injury at home as place of occurrence 10/11/2019   Fall involving wheelchair as cause of accidental injury 10/11/2019   Falls frequently    "several in 2017; fell at dr's office yesterday" (09/18/2017)   GERD (gastroesophageal reflux disease)      HCAP (healthcare-associated pneumonia) 02/12/2016   Heart murmur    History of cardiovascular stress test 08/15/2004   EF of 70% -- Normal stress cardiolite.  There is no evidence of ischemia and there is normal LV function. -- [name]. MD   History of echocardiogram 12/24/2006   Est. EF of 75-64% NORMAL LV SYSTOLIC FUNCTION WITH IMPAIRED RELAXATION -- MILD AORTIC SCLEROSIS -- NORMAL PALONARY ARTERY PRESSURE -- NO OLD ECHOS FOR COMPARISON -- [name], MD   Hypertension    essential hypertension   Migraine    "when I was having my periods; none since then" (09/18/2017)   Necrotic pneumonia ([place])    recurrent/notes 02/12/2016   Pneumonia    "several times since May 2015" (07/06/2014)   Pollen allergies    Tobacco abuse    smoking about 1/2 pk of cigarettes a day   Tobacco dependence due to cigarettes 10/11/2019   Traumatic hematoma of knee, right, initial encounter 10/11/2019    Patient Active Problem List   Diagnosis Date Noted   Cellulitis 10/21/2019   Bipolar I disorder ([place]) 10/11/2019   Tobacco dependence due to cigarettes 10/11/2019   Fall as cause of accidental injury at home as place of occurrence 10/11/2019   Traumatic hematoma of knee, right, initial encounter 10/11/2019   Lower respiratory  infection 12/22/2018   Senile ecchymosis 09/11/2018   CAP (community acquired pneumonia) 09/17/2017   Near syncope 09/17/2017   Tobacco abuse 02/12/2016   Other fatigue 02/04/2016   Respiratory failure with hypoxia ([place]) 01/09/2016   Malnutrition of moderate degree 11/03/2015   Pulmonary emphysema ([place])    Essential hypertension 09/27/2015   Rhabdomyolysis 08/16/2015   Cough 06/07/2015   Bronchiectasis with acute exacerbation ([place]) 03/07/2015   Cigarette smoker 12/13/2014   Stage 2 moderate COPD by GOLD classification ([place]) 10/11/2014   Lumbar back pain with radiculopathy affecting right lower extremity 09/28/2014   Hyponatremia  07/09/2014   Hx of recurrent pneumonia 07/08/2014   Venous insufficiency (chronic) (peripheral) 09/29/2012   Lower extremity edema 03/09/2012   Benign hypertensive heart disease without heart failure 05/09/2011   Chest pain 05/09/2011   Dyslipidemia 05/09/2011   Mood disorder ([place]) 05/09/2011   Osteoarthritis 05/09/2011    Past Surgical History:  Procedure Laterality Date   CAROTID STENT     DILATION AND CURETTAGE OF UTERUS     TUBAL LIGATION  1986     OB History   No obstetric history on file.      Home Medications    Prior to Admission medications   Medication Sig Start Date End Date Taking? Authorizing Provider  albuterol (PROVENTIL) (2.5 MG/3ML) 0.083% nebulizer solution Take 2.5 mg by nebulization every 6 (six) hours as needed for wheezing or shortness of breath.   Yes [provider]  budesonide-formoterol (SYMBICORT) 160-4.5 MCG/ACT inhaler Inhale 2 puffs into the lungs 2 (two) times daily. 09/19/17  Yes Patrecia Pour, Christean Grief, MD  fluticasone [facility]) 50 MCG/ACT nasal spray Place 2 sprays into both nostrils daily. 07/15/19  Yes Maximiano Coss, NP  VENTOLIN HFA 108 (90 Base) MCG/ACT inhaler TAKE 2 PUFFS BY MOUTH EVERY 6 HOURS AS NEEDED FOR WHEEZE OR SHORTNESS OF BREATH Patient taking differently: Inhale 2 puffs into the lungs every 6 (six) hours as needed for wheezing or shortness of breath.  05/20/19  Yes Rutherford Guys, MD  amLODipine (NORVASC) 2.5 MG tablet Take 1 tablet (2.5 mg total) by mouth daily. 07/29/19   Rutherford Guys, MD  aspirin EC 81 MG tablet Take 81-162 mg by mouth See admin instructions. 81 mg once a day "for heart health" and 162 mg one to two times a day as needed for headaches    [provider]  calcium carbonate (TUMS EX) 750 MG chewable tablet Chew 1 tablet by mouth 2 (two) times daily as needed for heartburn.    [provider]  divalproex (DEPAKOTE ER) 250 MG 24 hr tablet Take 3 tablets (750 mg total) by mouth  at bedtime. 08/17/19   Arfeen, Arlyce Harman, MD  doxycycline (VIBRA-TABS) 100 MG tablet Take 1 tablet (100 mg total) by mouth 2 (two) times daily. 10/18/19   Wendall Mola, NP  HYDROcodone-acetaminophen (NORCO/VICODIN) 5-325 MG tablet Take 1-2 tablets by mouth every 6 (six) hours as needed for up to 30 doses for moderate pain. 10/11/19   Wendall Mola, NP  hydrOXYzine (ATARAX/VISTARIL) 10 MG tablet Take 1 tablet (10 mg total) by mouth at bedtime as needed for anxiety. 09/22/19   Arfeen, Arlyce Harman, MD  methocarbamol (ROBAXIN) 500 MG tablet Take 1 tablet (500 mg total) by mouth every 8 (eight) hours as needed for muscle spasms. 07/29/19   Rutherford Guys, MD  nortriptyline (PAMELOR) 50 MG capsule TAKE ONE TO TWO CAPSULES BY MOUTH NIGHTLY 08/17/19   Arfeen, Dossie Der  T, MD  PRESCRIPTION MEDICATION High-Frequency Chest Wall Oscillation (the Vest):    [provider]  spironolactone (ALDACTONE) 50 MG tablet Take 1 tablet (50 mg total) by mouth daily. Office visit needed for refills Patient taking differently: Take 50 mg by mouth daily.  09/21/19   Rutherford Guys, MD  tiotropium (SPIRIVA) 18 MCG inhalation capsule Place 1 capsule (18 mcg total) into inhaler and inhale daily. Patient not taking: Reported on 10/22/2019 10/11/19 11/10/19  Wendall Mola, NP  triamcinolone cream (KENALOG) 0.1 % Apply 1 application topically 2 (two) times daily. 10/18/19   Wendall Mola, NP  venlafaxine (EFFEXOR) 75 MG tablet Take 1 tablet (75 mg total) by mouth daily. 08/17/19 08/16/20  Arfeen, Arlyce Harman, MD  Vitamin D, Ergocalciferol, (DRISDOL) 1.25 MG (50000 UT) CAPS capsule Take 1 capsule (50,000 Units total) by mouth every 7 (seven) days. 10/14/19   Wendall Mola, NP    Family History Family History  Problem Relation Age of Onset   Stroke Mother    Heart disease Father    Hyperlipidemia Father    Hypertension Father    Breast cancer Maternal Aunt    Asthma Maternal Grandmother     Social  History Social History   Tobacco Use   Smoking status: Current Every Day Smoker    Packs/day: 0.50    Years: 46.00    Pack years: 23.00    Types: Cigarettes   Smokeless tobacco: Never Used  Substance Use Topics   Alcohol use: Yes    Alcohol/week: 0.0 standard drinks    Comment: 09/18/2017 "might have 1 drink/month; if that"   Drug use: No     Allergies   Sulfa drugs cross reactors, Ceclor [cefaclor], Depakote er [divalproex sodium er], Escitalopram oxalate, Latex, Sertraline hcl, and Tape   Review of Systems Review of Systems  Constitutional: Positive for chills and fever.  HENT: Negative for congestion.   Eyes: Negative for visual disturbance.  Respiratory: Negative for cough and shortness of breath.   Cardiovascular: Negative for chest pain and palpitations.  Gastrointestinal: Negative for abdominal pain.  Genitourinary: Negative for dysuria and hematuria.  Musculoskeletal: Positive for myalgias. Negative for arthralgias and back pain.  Skin: Positive for rash and wound. Negative for color change.  Neurological: Negative for seizures and syncope.  All other systems reviewed and are negative.    Physical Exam Updated Vital Signs BP 128/65    Pulse 78    Temp 97.8 F (36.6 C) (Oral)    Resp 14    SpO2 95%   Physical Exam Vitals signs and nursing note reviewed.  Constitutional:      General: She is not in acute distress.    Appearance: She is well-developed.  HENT:     Head: Normocephalic and atraumatic.  Eyes:     Extraocular Movements: Extraocular movements intact.     Conjunctiva/sclera: Conjunctivae normal.  Neck:     Musculoskeletal: Neck supple.  Cardiovascular:     Rate and Rhythm: Normal rate and regular rhythm.     Heart sounds: No murmur.  Pulmonary:     Effort: Pulmonary effort is normal. No respiratory distress.     Breath sounds: Normal breath sounds.  Abdominal:     Palpations: Abdomen is soft.     Tenderness: There is no abdominal  tenderness.  Musculoskeletal:        General: Swelling, tenderness and signs of injury present.     Right lower leg: Edema present.  Comments: RLE with edema and erythema/warmth to the level of the knee. Eschar present over anterior patella. Gross sensation and motor function intact. TTP of RLE but not out of proportion.  2+ DP pulses FROM of Knee and Ankle   Skin:    General: Skin is warm and dry.  Neurological:     General: No focal deficit present.     Mental Status: She is alert and oriented to person, place, and time.  Psychiatric:        Mood and Affect: Mood normal.        Behavior: Behavior normal.      ED Treatments / Results  Labs (all labs ordered are listed, but only abnormal results are displayed) Labs Reviewed  COMPREHENSIVE METABOLIC PANEL - Abnormal; Notable for the following components:      Result Value   Alkaline Phosphatase 141 (*)    All other components within normal limits  CULTURE, BLOOD (ROUTINE X 2)  CULTURE, BLOOD (ROUTINE X 2)  SARS CORONAVIRUS 2 (TAT 6-24 HRS)  LACTIC ACID, PLASMA  LACTIC ACID, PLASMA  CBC WITH DIFFERENTIAL/PLATELET  SEDIMENTATION RATE  C-REACTIVE PROTEIN    EKG None  Radiology Dg Tibia/fibula Right  Result Date: 10/21/2019 CLINICAL DATA:  Pain, swelling, erythema. Recent injury. Patient reports infection. Worsening pain and swelling. EXAM: RIGHT TIBIA AND FIBULA - 2 VIEW COMPARISON:  No prior tibia/fibular imaging. FINDINGS: Cortical margins of the tibia and fibular intact. No osseous erosions or periosteal reaction. There is no evidence of fracture or other focal bone lesions. Diffuse soft tissue edema of the lower leg. No soft tissue air or radiopaque foreign body. IMPRESSION: 1. No acute osseous abnormality of the right lower leg. 2. Diffuse subcutaneous soft tissue edema.  No soft tissue air. Electronically Signed   By: Keith Rake M.D.   On: 10/21/2019 23:28   Dg Knee Complete 4 Views Right  Result Date:  10/21/2019 CLINICAL DATA:  Pain, swelling, erythema. Recent injury. Patient reports infection. Worsening pain and swelling. EXAM: RIGHT KNEE - COMPLETE 4+ VIEW COMPARISON:  Knee radiograph 10/11/2019 FINDINGS: No evidence of fracture, dislocation, or joint effusion. No evidence of arthropathy or other focal bone abnormality. Soft tissue thickening anterior to the patellar tendon. Generalized soft tissue edema. IMPRESSION: 1. Similar prepatellar soft tissue thickening to prior exam, suggesting hematoma in the setting of injury. 2. Diffuse increase subcutaneous soft tissue edema. 3. No acute osseous abnormality. Electronically Signed   By: Keith Rake M.D.   On: 10/21/2019 23:30   Dg Foot Complete Right  Result Date: 10/21/2019 CLINICAL DATA:  Pain, swelling, erythema. Recent injury. Patient reports infection. Worsening pain and swelling. EXAM: RIGHT FOOT COMPLETE - 3+ VIEW COMPARISON:  None. FINDINGS: There is no evidence of fracture or dislocation. Prominent plantar calcaneal spur. There is no evidence of arthropathy or other focal bone abnormality. Mild generalized soft tissue edema. No soft tissue air or radiopaque foreign body. IMPRESSION: 1. No acute osseous abnormality. 2. Generalized soft tissue edema. 3. Plantar calcaneal spur. Electronically Signed   By: Keith Rake M.D.   On: 10/21/2019 23:29    Procedures Procedures (including critical care time)  Medications Ordered in ED Medications  HYDROcodone-acetaminophen (NORCO/VICODIN) 5-325 MG per tablet 1 tablet (1 tablet Oral Given 10/21/19 2326)  clindamycin (CLEOCIN) IVPB 600 mg (600 mg Intravenous New Bag/Given 10/21/19 2335)     Initial Impression / Assessment and Plan / ED Course  I have reviewed the triage vital signs and the nursing notes.  Pertinent labs & imaging results that were available during my care of the patient were reviewed by me and considered in my medical decision making (see chart for details).  On  arrival, patient is afebrile, HDS.    Patient has healing scab/eschar over right anterior knee.  No surrounding fluctuance or crepitus.  No pain out of proportion.  Low suspicion for necrotizing skin infection. Patient has full range of motion of right knee and ankle.  Clinically not consistent with septic arthritis.  Concern for cellulitis of right lower extremity in setting of recent injury and wound.  X-ray: edema without evidence of fracture/bony erosion; Knee XR with similar prepatellar soft tissue thickening  DVT study ordered and pending  No leukocytosis.  ESR/CRP wnl. Patient is not systemically ill.  However, patient has been on doxycycline for 4 days with worsening of her symptoms concerning for failed outpatient treatment.  Blood cultures obtained and pending.  Patient started on IV clindamycin.  Hospitalist contacted for admission and have assumed care at time of handoff.     Final Clinical Impressions(s) / ED Diagnoses   Final diagnoses:  Injury    ED Discharge Orders    None       Burns Spain, MD 10/22/19 0035    Lennice Sites, DO 10/23/19 [phone]

## 2019-10-21 NOTE — ED Triage Notes (Signed)
Patient presents ambulatory c/o infection in her right leg. States she has been on doxycyline since Monday from her PCP. C/o worsening pain and swelling.

## 2019-10-21 NOTE — ED Provider Notes (Signed)
I have personally seen and examined the patient. I have reviewed the documentation on PMH/FH/Soc Hx. I have discussed the plan of care with the resident and patient.  I have reviewed and agree with the resident's documentation. Please see associated encounter note.  Briefly, the patient is a 68 y.o. female here with right leg pain and swelling.  Patient had a fall on her knee about over a week ago.  Had negative x-ray at the time of the fall.  Was started on doxycycline about 5 days ago after wound site got red and started to have some drainage.  Redness has progressed down the leg with swelling down the leg.  Has good pulses.  Doubt arterial process.  Overall patient likely with cellulitis that has failed outpatient treatment.  Will give IV clindamycin.  No fever, no significant leukocytosis.  However, clinically believe she would benefit from observation stay for some rounds IV antibiotics.  We will get an ultrasound of the leg to rule out DVT.  However, suspect cellulitis.  Stable throughout my care.  No concern for sepsis.  Blood cultures have been drawn.  This chart was dictated using voice recognition software.  Despite best efforts to proofread,  errors can occur which can change the documentation meaning.     EKG Interpretation None         Lennice Sites, DO 10/21/19 2323

## 2019-10-22 ENCOUNTER — Inpatient Hospital Stay (HOSPITAL_COMMUNITY): Payer: Medicare Other

## 2019-10-22 ENCOUNTER — Encounter (HOSPITAL_COMMUNITY): Payer: Self-pay | Admitting: Internal Medicine

## 2019-10-22 DIAGNOSIS — R52 Pain, unspecified: Secondary | ICD-10-CM

## 2019-10-22 DIAGNOSIS — Z882 Allergy status to sulfonamides status: Secondary | ICD-10-CM | POA: Diagnosis not present

## 2019-10-22 DIAGNOSIS — Z7951 Long term (current) use of inhaled steroids: Secondary | ICD-10-CM | POA: Diagnosis not present

## 2019-10-22 DIAGNOSIS — I10 Essential (primary) hypertension: Secondary | ICD-10-CM | POA: Diagnosis not present

## 2019-10-22 DIAGNOSIS — F17210 Nicotine dependence, cigarettes, uncomplicated: Secondary | ICD-10-CM | POA: Diagnosis present

## 2019-10-22 DIAGNOSIS — Z7982 Long term (current) use of aspirin: Secondary | ICD-10-CM | POA: Diagnosis not present

## 2019-10-22 DIAGNOSIS — L0390 Cellulitis, unspecified: Secondary | ICD-10-CM | POA: Diagnosis not present

## 2019-10-22 DIAGNOSIS — L03115 Cellulitis of right lower limb: Secondary | ICD-10-CM | POA: Diagnosis not present

## 2019-10-22 DIAGNOSIS — S8001XA Contusion of right knee, initial encounter: Secondary | ICD-10-CM | POA: Diagnosis present

## 2019-10-22 DIAGNOSIS — Z9109 Other allergy status, other than to drugs and biological substances: Secondary | ICD-10-CM | POA: Diagnosis not present

## 2019-10-22 DIAGNOSIS — Z20828 Contact with and (suspected) exposure to other viral communicable diseases: Secondary | ICD-10-CM | POA: Diagnosis present

## 2019-10-22 DIAGNOSIS — Z91040 Latex allergy status: Secondary | ICD-10-CM | POA: Diagnosis not present

## 2019-10-22 DIAGNOSIS — J439 Emphysema, unspecified: Secondary | ICD-10-CM | POA: Diagnosis present

## 2019-10-22 DIAGNOSIS — Z825 Family history of asthma and other chronic lower respiratory diseases: Secondary | ICD-10-CM | POA: Diagnosis not present

## 2019-10-22 DIAGNOSIS — L538 Other specified erythematous conditions: Secondary | ICD-10-CM

## 2019-10-22 DIAGNOSIS — Z888 Allergy status to other drugs, medicaments and biological substances status: Secondary | ICD-10-CM | POA: Diagnosis not present

## 2019-10-22 DIAGNOSIS — M7989 Other specified soft tissue disorders: Secondary | ICD-10-CM | POA: Diagnosis not present

## 2019-10-22 DIAGNOSIS — Z8249 Family history of ischemic heart disease and other diseases of the circulatory system: Secondary | ICD-10-CM | POA: Diagnosis not present

## 2019-10-22 DIAGNOSIS — Z881 Allergy status to other antibiotic agents status: Secondary | ICD-10-CM | POA: Diagnosis not present

## 2019-10-22 DIAGNOSIS — Z79899 Other long term (current) drug therapy: Secondary | ICD-10-CM | POA: Diagnosis not present

## 2019-10-22 DIAGNOSIS — W010XXA Fall on same level from slipping, tripping and stumbling without subsequent striking against object, initial encounter: Secondary | ICD-10-CM | POA: Diagnosis present

## 2019-10-22 DIAGNOSIS — F319 Bipolar disorder, unspecified: Secondary | ICD-10-CM | POA: Diagnosis present

## 2019-10-22 DIAGNOSIS — D696 Thrombocytopenia, unspecified: Secondary | ICD-10-CM | POA: Diagnosis present

## 2019-10-22 LAB — CBC
HCT: 35.2 % — ABNORMAL LOW (ref 36.0–46.0)
Hemoglobin: 11.5 g/dL — ABNORMAL LOW (ref 12.0–15.0)
MCH: 30.1 pg (ref 26.0–34.0)
MCHC: 32.7 g/dL (ref 30.0–36.0)
MCV: 92.1 fL (ref 80.0–100.0)
Platelets: 101 10*3/uL — ABNORMAL LOW (ref 150–400)
RBC: 3.82 MIL/uL — ABNORMAL LOW (ref 3.87–5.11)
RDW: 13.8 % (ref 11.5–15.5)
WBC: 3.8 10*3/uL — ABNORMAL LOW (ref 4.0–10.5)
nRBC: 0 % (ref 0.0–0.2)

## 2019-10-22 LAB — BASIC METABOLIC PANEL
Anion gap: 8 (ref 5–15)
BUN: 11 mg/dL (ref 8–23)
CO2: 26 mmol/L (ref 22–32)
Calcium: 8.7 mg/dL — ABNORMAL LOW (ref 8.9–10.3)
Chloride: 102 mmol/L (ref 98–111)
Creatinine, Ser: 0.77 mg/dL (ref 0.44–1.00)
GFR calc Af Amer: >60 mL/min (ref >60)
GFR calc non Af Amer: >60 mL/min (ref >60)
Glucose, Bld: 91 mg/dL (ref 70–99)
Potassium: 3.4 mmol/L — ABNORMAL LOW (ref 3.5–5.1)
Sodium: 136 mmol/L (ref 135–145)

## 2019-10-22 LAB — SEDIMENTATION RATE: Sed Rate: 14 mm/hr (ref 0–22)

## 2019-10-22 LAB — HIV ANTIBODY (ROUTINE TESTING W REFLEX): HIV Screen 4th Generation wRfx: NONREACTIVE

## 2019-10-22 LAB — SARS CORONAVIRUS 2 (TAT 6-24 HRS): SARS Coronavirus 2: NEGATIVE

## 2019-10-22 LAB — C-REACTIVE PROTEIN: CRP: <0.8 mg/dL (ref <1.0)

## 2019-10-22 LAB — LACTIC ACID, PLASMA: Lactic Acid, Venous: 1.5 mmol/L (ref 0.5–1.9)

## 2019-10-22 MED ORDER — HYDROCODONE-ACETAMINOPHEN 5-325 MG PO TABS
1.0000 | ORAL_TABLET | Freq: Four times a day (QID) | ORAL | Status: DC | PRN
  Administered 2019-10-22 – 2019-10-26 (×10): 2 via ORAL
  Filled 2019-10-22 (×10): qty 2

## 2019-10-22 MED ORDER — ONDANSETRON HCL 4 MG/2ML IJ SOLN: 4.0000 mg | Freq: Four times a day (QID) | INTRAMUSCULAR | Status: DC | PRN

## 2019-10-22 MED ORDER — CALCIUM CARBONATE ANTACID 500 MG PO CHEW: 1.5000 | CHEWABLE_TABLET | Freq: Two times a day (BID) | ORAL | Status: DC | PRN

## 2019-10-22 MED ORDER — VENLAFAXINE HCL 75 MG PO TABS
75.0000 mg | ORAL_TABLET | Freq: Every day | ORAL | Status: DC
  Administered 2019-10-22 – 2019-10-26 (×5): 75 mg via ORAL
  Filled 2019-10-22 (×8): qty 1

## 2019-10-22 MED ORDER — MOMETASONE FURO-FORMOTEROL FUM 200-5 MCG/ACT IN AERO
2.0000 | INHALATION_SPRAY | Freq: Two times a day (BID) | RESPIRATORY_TRACT | Status: DC
  Administered 2019-10-22 – 2019-10-26 (×3): 2 via RESPIRATORY_TRACT
  Filled 2019-10-22 (×3): qty 8.8

## 2019-10-22 MED ORDER — VITAMIN D (ERGOCALCIFEROL) 1.25 MG (50000 UNIT) PO CAPS
50000.0000 [IU] | ORAL_CAPSULE | ORAL | Status: DC
  Administered 2019-10-25: 50000 [IU] via ORAL
  Filled 2019-10-22: qty 1

## 2019-10-22 MED ORDER — SODIUM CHLORIDE 0.9 % IV SOLN
Freq: Once | INTRAVENOUS | Status: AC
  Administered 2019-10-22: 05:00:00 via INTRAVENOUS

## 2019-10-22 MED ORDER — ONDANSETRON HCL 4 MG PO TABS: 4.0000 mg | ORAL_TABLET | Freq: Four times a day (QID) | ORAL | Status: DC | PRN

## 2019-10-22 MED ORDER — ASPIRIN EC 81 MG PO TBEC
81.0000 mg | DELAYED_RELEASE_TABLET | Freq: Every day | ORAL | Status: DC
  Administered 2019-10-22 – 2019-10-26 (×5): 81 mg via ORAL
  Filled 2019-10-22 (×5): qty 1

## 2019-10-22 MED ORDER — ACETAMINOPHEN 325 MG PO TABS: 650.0000 mg | ORAL_TABLET | Freq: Four times a day (QID) | ORAL | Status: DC | PRN

## 2019-10-22 MED ORDER — HEPARIN SODIUM (PORCINE) 5000 UNIT/ML IJ SOLN
5000.0000 [IU] | Freq: Three times a day (TID) | INTRAMUSCULAR | Status: DC
  Administered 2019-10-22 – 2019-10-26 (×12): 5000 [IU] via SUBCUTANEOUS
  Filled 2019-10-22 (×13): qty 1

## 2019-10-22 MED ORDER — VANCOMYCIN HCL IN DEXTROSE 750-5 MG/150ML-% IV SOLN
750.0000 mg | INTRAVENOUS | Status: DC
  Administered 2019-10-22: 750 mg via INTRAVENOUS
  Filled 2019-10-22: qty 150

## 2019-10-22 MED ORDER — VANCOMYCIN HCL IN DEXTROSE 1-5 GM/200ML-% IV SOLN
1000.0000 mg | INTRAVENOUS | Status: DC
  Administered 2019-10-23 – 2019-10-26 (×4): 1000 mg via INTRAVENOUS
  Filled 2019-10-22 (×5): qty 200

## 2019-10-22 MED ORDER — LACTATED RINGERS IV SOLN: INTRAVENOUS | Status: DC

## 2019-10-22 MED ORDER — POTASSIUM CHLORIDE CRYS ER 20 MEQ PO TBCR
40.0000 meq | EXTENDED_RELEASE_TABLET | Freq: Once | ORAL | Status: AC
  Administered 2019-10-22: 40 meq via ORAL
  Filled 2019-10-22: qty 2

## 2019-10-22 MED ORDER — ALBUTEROL SULFATE (2.5 MG/3ML) 0.083% IN NEBU: 2.5000 mg | INHALATION_SOLUTION | Freq: Four times a day (QID) | RESPIRATORY_TRACT | Status: DC | PRN

## 2019-10-22 MED ORDER — PIPERACILLIN-TAZOBACTAM 3.375 G IVPB
3.3750 g | Freq: Three times a day (TID) | INTRAVENOUS | Status: DC
  Administered 2019-10-22 – 2019-10-25 (×10): 3.375 g via INTRAVENOUS
  Filled 2019-10-22 (×11): qty 50

## 2019-10-22 MED ORDER — ACETAMINOPHEN 650 MG RE SUPP: 650.0000 mg | Freq: Four times a day (QID) | RECTAL | Status: DC | PRN

## 2019-10-22 MED ORDER — NORTRIPTYLINE HCL 25 MG PO CAPS
100.0000 mg | ORAL_CAPSULE | Freq: Every day | ORAL | Status: DC
  Administered 2019-10-22 – 2019-10-25 (×5): 100 mg via ORAL
  Filled 2019-10-22 (×6): qty 4

## 2019-10-22 MED ORDER — DIVALPROEX SODIUM ER 500 MG PO TB24
750.0000 mg | ORAL_TABLET | Freq: Every day | ORAL | Status: DC
  Administered 2019-10-22 – 2019-10-25 (×5): 750 mg via ORAL
  Filled 2019-10-22 (×6): qty 1

## 2019-10-22 MED ORDER — METHOCARBAMOL 500 MG PO TABS
500.0000 mg | ORAL_TABLET | Freq: Three times a day (TID) | ORAL | Status: DC | PRN
  Administered 2019-10-25: 500 mg via ORAL
  Filled 2019-10-22: qty 1

## 2019-10-22 NOTE — Progress Notes (Signed)
Pharmacy Antibiotic Note  Catherine Kelley is a 68 y.o. female admitted on 10/21/2019 with cellulitis.  Pharmacy has been consulted for Vancomycin/Zosyn dosing. WBC WNL. Renal function OK.   Plan: Vancomycin 750 mg IV q24h >>Estimated AUC: 451 Zosyn 3.375G IV q8h to be infused over 4 hours Trend WBC, temp, renal function  F/U infectious work-up Drug levels as indicated  Temp (24hrs), Avg:97.8 F (36.6 C), Min:97.8 F (36.6 C), Max:97.8 F (36.6 C)  Recent Labs  Lab 10/21/19 1905 10/21/19 2305  WBC 4.8  --   CREATININE 0.94  --   LATICACIDVEN 1.2 1.5    Estimated Creatinine Clearance: 46.7 mL/min (by C-G formula based on SCr of 0.94 mg/dL).    Allergies  Allergen Reactions  . Sulfa Drugs Cross Reactors Hives and Rash  . Ceclor [Cefaclor] Hives    Tolerated ceftazidime December 2016  . Depakote Er [Divalproex Sodium Er] Other (See Comments)    Patient remarked that when she was taking this TWICE a day, she became "clumsy" and felt more prone to stumbling  . Escitalopram Oxalate Other (See Comments)    Pt does not recall ever taking medication (??)  . Latex Itching  . Sertraline Hcl Other (See Comments)    Severe headaches   . Tape Itching and Rash    Band-Aids, also (PAPER TAPE IS TOLERATED)   [name], PharmD, BCPS Clinical Pharmacist Phone: [phone]

## 2019-10-22 NOTE — H&P (Signed)
History and Physical    Catherine Kelley KKX:[id number] DOB: 1951-03-21 DOA: 10/21/2019  PCP: Rutherford Guys, MD  Patient coming from: Home.  Chief Complaint: Right leg swelling.  HPI: Catherine Kelley is a 68 y.o. female with history of COPD with ongoing tobacco abuse, hypertension, bipolar disorder, history of carotid stent presents to the ER with worsening swelling of the right lower extremity.  Patient had a fall after tripping on a walker about 10 days ago and had injured her right knee and had a small laceration and a small hematoma at that time.  Followed up with her primary care physician.  Initially had x-rays and was treated conservatively.  Few days later patient started developing erythema and swelling for which patient was placed on doxycycline which patient has been taking it for last 5 days.  Despite which patient swelling has worsened.  Has become more erythematous and swelling and erythema has started moving distally involving the whole right leg from the knee downwards.  Has some subjective feeling of fever chills.  Has chronic cough denies any new shortness of breath or chest pain.  ED Course: In the ER patient was afebrile blood pressure was in the low normal lactic acid and CRP were normal.  X-rays of the right lower extremity shows hematoma and soft tissue swelling.  Dopplers were ordered which is still pending.  Cultures were obtained and started on empiric antibiotics.  Labs show platelet counts not counted but has had chronic thrombocytopenia.  Creatinine 0.9 hemoglobin 13.7 WBC 4.8 COVID-19 test is pending.  On exam patient has swelling of the right lower extremity below the right knee able to move her legs and no limitation in the joint mobility.  Review of Systems: As per HPI, rest all negative.   Past Medical History:  Diagnosis Date   Anxiety    Arthritis    "jooints" (09/18/2017)   Asthma    Bipolar disorder ([place])    Bipolar I disorder ([place]) 10/11/2019   CAP  (community acquired pneumonia) 09/18/2017   Cat allergies    Chronic bronchitis ([place])    "get it alot; maybe not q yr" (07/06/2014)   Chronic lower back pain    "spurs and pinched nerves" (09/18/2017)   COPD (chronic obstructive pulmonary disease) ([place])    DDD (degenerative disc disease), lumbar    Depression    psychiatrist Dr. Toy Care   Diastolic dysfunction    Emphysematous COPD ([place])    changes in right base along with patchy areas on last x-ray   Fall as cause of accidental injury at home as place of occurrence 10/11/2019   Fall involving wheelchair as cause of accidental injury 10/11/2019   Falls frequently    "several in 2017; fell at dr's office yesterday" (09/18/2017)   GERD (gastroesophageal reflux disease)    HCAP (healthcare-associated pneumonia) 02/12/2016   Heart murmur    History of cardiovascular stress test 08/15/2004   EF of 70% -- Normal stress cardiolite.  There is no evidence of ischemia and there is normal LV function. -- [name]. MD   History of echocardiogram 12/24/2006   Est. EF of 16-96% NORMAL LV SYSTOLIC FUNCTION WITH IMPAIRED RELAXATION -- MILD AORTIC SCLEROSIS -- NORMAL PALONARY ARTERY PRESSURE -- NO OLD ECHOS FOR COMPARISON -- [name], MD   Hypertension    essential hypertension   Migraine    "when I was having my periods; none since then" (09/18/2017)   Necrotic pneumonia ([place])  recurrent/notes 02/12/2016   Pneumonia    "several times since May 2015" (07/06/2014)   Pollen allergies    Tobacco abuse    smoking about 1/2 pk of cigarettes a day   Tobacco dependence due to cigarettes 10/11/2019   Traumatic hematoma of knee, right, initial encounter 10/11/2019    Past Surgical History:  Procedure Laterality Date   CAROTID STENT     DILATION AND CURETTAGE OF UTERUS     TUBAL LIGATION  1986     reports that she has been smoking cigarettes. She has a 23.00 pack-year smoking history. She has never used  smokeless tobacco. She reports current alcohol use. She reports that she does not use drugs.  Allergies  Allergen Reactions   Sulfa Drugs Cross Reactors Hives and Rash   Ceclor [Cefaclor] Hives    Tolerated ceftazidime December 2016   Depakote Er [Divalproex Sodium Er] Other (See Comments)    Patient remarked that when she was taking this TWICE a day, she became "clumsy" and felt more prone to stumbling   Escitalopram Oxalate Other (See Comments)    Pt does not recall ever taking medication (??)   Latex Itching   Sertraline Hcl Other (See Comments)    Severe headaches    Tape Itching and Rash    Band-Aids, also (PAPER TAPE IS TOLERATED)    Family History  Problem Relation Age of Onset   Stroke Mother    Heart disease Father    Hyperlipidemia Father    Hypertension Father    Breast cancer Maternal Aunt    Asthma Maternal Grandmother     Prior to Admission medications   Medication Sig Start Date End Date Taking? Authorizing Provider  albuterol (PROVENTIL) (2.5 MG/3ML) 0.083% nebulizer solution Take 2.5 mg by nebulization every 6 (six) hours as needed for wheezing or shortness of breath.   Yes [provider]  amLODipine (NORVASC) 2.5 MG tablet Take 1 tablet (2.5 mg total) by mouth daily. 07/29/19  Yes Rutherford Guys, MD  aspirin EC 81 MG tablet Take 81 mg by mouth daily as needed.    Yes [provider]  budesonide-formoterol (SYMBICORT) 160-4.5 MCG/ACT inhaler Inhale 2 puffs into the lungs 2 (two) times daily. 09/19/17  Yes Patrecia Pour, Christean Grief, MD  calcium carbonate (TUMS EX) 750 MG chewable tablet Chew 1 tablet by mouth 2 (two) times daily as needed for heartburn.   Yes [provider]  divalproex (DEPAKOTE ER) 250 MG 24 hr tablet Take 3 tablets (750 mg total) by mouth at bedtime. 08/17/19  Yes Arfeen, Arlyce Harman, MD  doxycycline (VIBRA-TABS) 100 MG tablet Take 1 tablet (100 mg total) by mouth 2 (two) times daily. 10/18/19  Yes Wendall Mola, NP  fluticasone Asencion Islam) 50 MCG/ACT nasal spray Place 2 sprays into both nostrils daily. 07/15/19  Yes Maximiano Coss, NP  HYDROcodone-acetaminophen (NORCO/VICODIN) 5-325 MG tablet Take 1-2 tablets by mouth every 6 (six) hours as needed for up to 30 doses for moderate pain. 10/11/19  Yes Wendall Mola, NP  hydrOXYzine (ATARAX/VISTARIL) 10 MG tablet Take 1 tablet (10 mg total) by mouth at bedtime as needed for anxiety. 09/22/19  Yes Arfeen, Arlyce Harman, MD  methocarbamol (ROBAXIN) 500 MG tablet Take 1 tablet (500 mg total) by mouth every 8 (eight) hours as needed for muscle spasms. 07/29/19  Yes Rutherford Guys, MD  nortriptyline (PAMELOR) 50 MG capsule TAKE ONE TO TWO CAPSULES BY MOUTH NIGHTLY Patient taking differently: Take 100 mg by  mouth at bedtime.  08/17/19  Yes Arfeen, Arlyce Harman, MD  spironolactone (ALDACTONE) 50 MG tablet Take 1 tablet (50 mg total) by mouth daily. Office visit needed for refills Patient taking differently: Take 50 mg by mouth daily.  09/21/19  Yes Rutherford Guys, MD  triamcinolone cream (KENALOG) 0.1 % Apply 1 application topically 2 (two) times daily. 10/18/19  Yes Wendall Mola, NP  venlafaxine (EFFEXOR) 75 MG tablet Take 1 tablet (75 mg total) by mouth daily. 08/17/19 08/16/20 Yes Arfeen, Arlyce Harman, MD  VENTOLIN HFA 108 (90 Base) MCG/ACT inhaler TAKE 2 PUFFS BY MOUTH EVERY 6 HOURS AS NEEDED FOR WHEEZE OR SHORTNESS OF BREATH Patient taking differently: Inhale 2 puffs into the lungs every 6 (six) hours as needed for wheezing or shortness of breath.  05/20/19  Yes Rutherford Guys, MD  Vitamin D, Ergocalciferol, (DRISDOL) 1.25 MG (50000 UT) CAPS capsule Take 1 capsule (50,000 Units total) by mouth every 7 (seven) days. Patient taking differently: Take 50,000 Units by mouth every Tuesday.  10/14/19  Yes Wendall Mola, NP  PRESCRIPTION MEDICATION High-Frequency Chest Wall Oscillation (the Vest):    [provider]  tiotropium (SPIRIVA) 18 MCG inhalation  capsule Place 1 capsule (18 mcg total) into inhaler and inhale daily. Patient not taking: Reported on 10/22/2019 10/11/19 11/10/19  Wendall Mola, NP    Physical Exam: Constitutional: Moderately built and nourished. Vitals:   10/21/19 2345 10/22/19 0200 10/22/19 0208 10/22/19 0215  BP:   (!) 96/51 (!) 98/52  Pulse: 79   77  Resp:  16  18  Temp:      TempSrc:      SpO2: 96%      Eyes: Anicteric no pallor. ENMT: No discharge from the ears eyes nose or mouth. Neck: No mass felt.  No neck rigidity. Respiratory: No rhonchi or crepitations. Cardiovascular: S1-S2 heard. Abdomen: Soft nontender bowel sounds present. Musculoskeletal: Swelling of the right lower extremity below right knee with no restriction of joint mobility. Skin: Erythema of the right lower extremity below the right knee up to the foot. Neurologic: Alert awake oriented to time place and person.  Moves all extremities. Psychiatric: Appears normal.  Normal affect.   Labs on Admission: I have personally reviewed following labs and imaging studies  CBC: Recent Labs  Lab 10/21/19 1905  WBC 4.8  NEUTROABS 2.9  HGB 13.7  HCT 42.2  MCV 92.3  PLT PLATELET CLUMPS NOTED ON SMEAR, COUNT APPEARS DECREASED   Basic Metabolic Panel: Recent Labs  Lab 10/21/19 1905  NA 135  K 3.7  CL 100  CO2 24  GLUCOSE 89  BUN 11  CREATININE 0.94  CALCIUM 9.0   GFR: Estimated Creatinine Clearance: 46.7 mL/min (by C-G formula based on SCr of 0.94 mg/dL). Liver Function Tests: Recent Labs  Lab 10/21/19 1905  AST 32  ALT 23  ALKPHOS 141*  BILITOT 0.7  PROT 7.7  ALBUMIN 3.5   No results for input(s): LIPASE, AMYLASE in the last 168 hours. No results for input(s): AMMONIA in the last 168 hours. Coagulation Profile: No results for input(s): INR, PROTIME in the last 168 hours. Cardiac Enzymes: No results for input(s): CKTOTAL, CKMB, CKMBINDEX, TROPONINI in the last 168 hours. BNP (last 3 results) No results for  input(s): PROBNP in the last 8760 hours. HbA1C: No results for input(s): HGBA1C in the last 72 hours. CBG: No results for input(s): GLUCAP in the last 168 hours. Lipid Profile: No results for input(s): CHOL,  HDL, LDLCALC, TRIG, CHOLHDL, LDLDIRECT in the last 72 hours. Thyroid Function Tests: No results for input(s): TSH, T4TOTAL, FREET4, T3FREE, THYROIDAB in the last 72 hours. Anemia Panel: No results for input(s): VITAMINB12, FOLATE, FERRITIN, TIBC, IRON, RETICCTPCT in the last 72 hours. Urine analysis:    Component Value Date/Time   COLORURINE YELLOW 02/13/2016 0237   APPEARANCEUR Clear 07/29/2019 1606   LABSPEC 1.007 02/13/2016 0237   PHURINE 7.0 02/13/2016 0237   GLUCOSEU Negative 07/29/2019 1606   HGBUR NEGATIVE 02/13/2016 0237   BILIRUBINUR negative 09/16/2019 1047   BILIRUBINUR Negative 07/29/2019 1606   KETONESUR trace (5) (A) 09/16/2019 1047   KETONESUR NEGATIVE 02/13/2016 0237   PROTEINUR =100 (A) 09/16/2019 1047   PROTEINUR Negative 07/29/2019 1606   PROTEINUR NEGATIVE 02/13/2016 0237   UROBILINOGEN 1.0 09/16/2019 1047   UROBILINOGEN 0.2 08/16/2015 1310   NITRITE Positive (A) 09/16/2019 1047   NITRITE Negative 07/29/2019 1606   NITRITE NEGATIVE 02/13/2016 0237   LEUKOCYTESUR Large (3+) (A) 09/16/2019 1047   LEUKOCYTESUR Negative 07/29/2019 1606   Sepsis Labs: @LABRCNTIP (procalcitonin:4,lacticidven:4) )No results found for this or any previous visit (from the past 240 hour(s)).   Radiological Exams on Admission: Dg Tibia/fibula Right  Result Date: 10/21/2019 CLINICAL DATA:  Pain, swelling, erythema. Recent injury. Patient reports infection. Worsening pain and swelling. EXAM: RIGHT TIBIA AND FIBULA - 2 VIEW COMPARISON:  No prior tibia/fibular imaging. FINDINGS: Cortical margins of the tibia and fibular intact. No osseous erosions or periosteal reaction. There is no evidence of fracture or other focal bone lesions. Diffuse soft tissue edema of the lower leg. No soft  tissue air or radiopaque foreign body. IMPRESSION: 1. No acute osseous abnormality of the right lower leg. 2. Diffuse subcutaneous soft tissue edema.  No soft tissue air. Electronically Signed   By: Keith Rake M.D.   On: 10/21/2019 23:28   Dg Knee Complete 4 Views Right  Result Date: 10/21/2019 CLINICAL DATA:  Pain, swelling, erythema. Recent injury. Patient reports infection. Worsening pain and swelling. EXAM: RIGHT KNEE - COMPLETE 4+ VIEW COMPARISON:  Knee radiograph 10/11/2019 FINDINGS: No evidence of fracture, dislocation, or joint effusion. No evidence of arthropathy or other focal bone abnormality. Soft tissue thickening anterior to the patellar tendon. Generalized soft tissue edema. IMPRESSION: 1. Similar prepatellar soft tissue thickening to prior exam, suggesting hematoma in the setting of injury. 2. Diffuse increase subcutaneous soft tissue edema. 3. No acute osseous abnormality. Electronically Signed   By: Keith Rake M.D.   On: 10/21/2019 23:30   Dg Foot Complete Right  Result Date: 10/21/2019 CLINICAL DATA:  Pain, swelling, erythema. Recent injury. Patient reports infection. Worsening pain and swelling. EXAM: RIGHT FOOT COMPLETE - 3+ VIEW COMPARISON:  None. FINDINGS: There is no evidence of fracture or dislocation. Prominent plantar calcaneal spur. There is no evidence of arthropathy or other focal bone abnormality. Mild generalized soft tissue edema. No soft tissue air or radiopaque foreign body. IMPRESSION: 1. No acute osseous abnormality. 2. Generalized soft tissue edema. 3. Plantar calcaneal spur. Electronically Signed   By: Keith Rake M.D.   On: 10/21/2019 23:29     Assessment/Plan Principal Problem:   Cellulitis of right lower extremity Active Problems:   Stage 2 moderate COPD by GOLD classification (HCC)   Essential hypertension   Pulmonary emphysema (HCC)   Bipolar I disorder (HCC)   Cellulitis    1. Cellulitis of the right lower extremity after  mechanical fall -patient's x-rays do show possibility of a small hematoma on the  right knee area.  There is soft tissue edema in the x-ray seen in the leg.  We will keep patient on empiric antibiotics gently hydrate follow Dopplers.  Follow cultures. 2. COPD not actively wheezing.  Continue inhalers. 3. Chronic thrombocytopenia follow CBC. 4. History of carotid stent placement on aspirin.  Note that patient has thrombocytopenia. 5. Bipolar disorder on Depakote and Effexor.  Check Depakote level with next blood draw. 6. Hypertension on spironolactone which we will at this time hold due to low normal blood pressure.  Gently hydrate. 7. Tobacco abuse -tobacco cessation counseling requested.  COVID-19 test is pending. Dopplers of the lower extremities pending.   DVT prophylaxis: Heparin for now since patient has some hematoma in the right knee without involving the joint. Code Status: Full code. Family Communication: Discussed with patient. Disposition Plan: Home. Consults called: None. Admission status: Observation.   Rise Patience MD [facility] Pager [phone].  If 7PM-7AM, please contact night-coverage [email or web address] Password [facility]  10/22/2019, 2:29 AM

## 2019-10-22 NOTE — Progress Notes (Signed)
RLE venous duplex       has been completed. Preliminary results can be found under CV proc through chart review. Ziare Orrick, BS, RDMS, RVT   

## 2019-10-22 NOTE — Progress Notes (Signed)
Pharmacy Antibiotic Note  Catherine Kelley is a 68 y.o. female admitted on 10/21/2019 with cellulitis, failed outpatient Doxy. Pharmacy has been consulted for Vancomycin/Zosyn dosing. SCr improved to 0.77. BCx pending.  Plan: Increase vancomycin to 1000 mg IV Q 24 hrs with improving renal function. Goal AUC 400-550. Expected AUC: 524 SCr used: 0.8 Zosyn 3.375G IV q8h to be infused over 4 hours Monitor clinical progress, c/s, renal function F/u de-escalation plan/LOT, vancomycin levels as indicated   Temp (24hrs), Avg:97.8 F (36.6 C), Min:97.8 F (36.6 C), Max:97.8 F (36.6 C)  Recent Labs  Lab 10/21/19 1905 10/21/19 2305 10/22/19 0330  WBC 4.8  --  3.8*  CREATININE 0.94  --  0.77  LATICACIDVEN 1.2 1.5  --     Estimated Creatinine Clearance: 54.9 mL/min (by C-G formula based on SCr of 0.77 mg/dL).    Allergies  Allergen Reactions  . Sulfa Drugs Cross Reactors Hives and Rash  . Ceclor [Cefaclor] Hives    Tolerated ceftazidime December 2016  . Depakote Er [Divalproex Sodium Er] Other (See Comments)    Patient remarked that when she was taking this TWICE a day, she became "clumsy" and felt more prone to stumbling  . Escitalopram Oxalate Other (See Comments)    Pt does not recall ever taking medication (??)  . Latex Itching  . Sertraline Hcl Other (See Comments)    Severe headaches   . Tape Itching and Rash    Band-Aids, also (PAPER TAPE IS TOLERATED)    Elicia Lamp, PharmD, BCPS Please check AMION for all [place] contact numbers Clinical Pharmacist 10/22/2019 11:31 AM

## 2019-10-22 NOTE — Progress Notes (Signed)
Patient admitted after midnight, care began in ER prior to midnight.  She is here with worsening cellulitis, she failed outpatient doxy.  Left leg is swollen red, bruised and painful to move per patient.  Will ask nurse to mark areas of redness to monitor progression.  Will need continued IV abx and LE duplex.  Elevate extremity  Catherine Bear DO

## 2019-10-22 NOTE — ED Notes (Signed)
Ordered breakfast--Catherine Kelley 

## 2019-10-23 LAB — BASIC METABOLIC PANEL
Anion gap: 10 (ref 5–15)
BUN: 8 mg/dL (ref 8–23)
CO2: 23 mmol/L (ref 22–32)
Calcium: 8.7 mg/dL — ABNORMAL LOW (ref 8.9–10.3)
Chloride: 102 mmol/L (ref 98–111)
Creatinine, Ser: 0.57 mg/dL (ref 0.44–1.00)
GFR calc Af Amer: >60 mL/min (ref >60)
GFR calc non Af Amer: >60 mL/min (ref >60)
Glucose, Bld: 82 mg/dL (ref 70–99)
Potassium: 4.3 mmol/L (ref 3.5–5.1)
Sodium: 135 mmol/L (ref 135–145)

## 2019-10-23 LAB — CBC
HCT: 37.1 % (ref 36.0–46.0)
Hemoglobin: 12 g/dL (ref 12.0–15.0)
MCH: 30 pg (ref 26.0–34.0)
MCHC: 32.3 g/dL (ref 30.0–36.0)
MCV: 92.8 fL (ref 80.0–100.0)
Platelets: 93 10*3/uL — ABNORMAL LOW (ref 150–400)
RBC: 4 MIL/uL (ref 3.87–5.11)
RDW: 13.8 % (ref 11.5–15.5)
WBC: 4 10*3/uL (ref 4.0–10.5)
nRBC: 0 % (ref 0.0–0.2)

## 2019-10-23 NOTE — Plan of Care (Signed)

## 2019-10-23 NOTE — Progress Notes (Addendum)
Progress Note    Catherine Kelley  GUR:[id number] DOB: Jan 06, 1951  DOA: 10/21/2019 PCP: Rutherford Guys, MD    Brief Narrative:    Medical records reviewed and are as summarized below:  Catherine Kelley is an 68 y.o. female with history of COPD with ongoing tobacco abuse, hypertension, bipolar disorder, history of carotid stent presents to the ER with worsening swelling of the right lower extremity.  Patient had a fall after tripping on a walker about 10 days ago and had injured her right knee and had a small laceration and a small hematoma at that time.  Followed up with her primary care physician.  Initially had x-rays and was treated conservatively.  Few days later patient started developing erythema and swelling for which patient was placed on doxycycline which patient has been taking it for last 5 days.  Despite which patient swelling has worsened.  Has become more erythematous and swelling and erythema has started moving distally involving the whole right leg from the knee downwards.   Assessment/Plan:   Principal Problem:   Cellulitis of right lower extremity Active Problems:   Stage 2 moderate COPD by GOLD classification (HCC)   Essential hypertension   Pulmonary emphysema (HCC)   Bipolar I disorder (HCC)   Cellulitis    Cellulitis of the right lower extremity -failed outpatient abx of doxy -occurred after mechanical fall  -patient's x-rays do show possibility of a small hematoma on the right knee area.  There is soft tissue edema in the x-ray seen in the leg.   -IV abx -blood cultures NGTD -elevate extremity -duplex negative for DVT  COPD - stable -Continue inhalers.  Chronic thrombocytopenia  -follow CBC  History of carotid stent placement  -on aspirin  Bipolar disorder  -on Depakote and Effexor  Hypertension  -BP lower end of normal -resume home meds as able  Tobacco abuse  -tobacco cessation counseling provided   Family Communication/Anticipated D/C date  and plan/Code Status   DVT prophylaxis: heparin Code Status: Full Code.  Family Communication:  Disposition Plan: 24-48 more hours of IV abx pending response   Medical Consultants:    None.   Subjective:  relieved to hear no blood clot Leg still hurts  Objective:    Vitals:   10/22/19 2033 10/23/19 0554 10/23/19 0557 10/23/19 0843  BP: (!) 158/73  134/70 (!) 109/55  Pulse: 87  85 83  Resp: 18  16 18   Temp: 97.9 F (36.6 C)  98.3 F (36.8 C) (!) 97.5 F (36.4 C)  TempSrc: Oral   Oral  SpO2: 91%  97% 93%  Weight:  53.1 kg      Intake/Output Summary (Last 24 hours) at 10/23/2019 1112 Last data filed at 10/23/2019 0840 Gross per 24 hour  Intake 678.73 ml  Output 0 ml  Net 678.73 ml   Filed Weights   10/22/19 1256 10/23/19 0554  Weight: 52.9 kg 53.1 kg    Exam: In bed, pleasant and cooperative rrr No increased work of breathing Right knee with less erythema surrounding the wound on patella Right LE with less swelling and mild improvement in her erythema   Data Reviewed:   I have personally reviewed following labs and imaging studies:  Labs: Labs show the following:   Basic Metabolic Panel: Recent Labs  Lab 10/21/19 1905 10/22/19 0330 10/23/19 0457  NA 135 136 135  K 3.7 3.4* 4.3  CL 100 102 102  CO2 24 26 23   GLUCOSE 89  91 82  BUN 11 11 8   CREATININE 0.94 0.77 0.57  CALCIUM 9.0 8.7* 8.7*   GFR Estimated Creatinine Clearance: 54.9 mL/min (by C-G formula based on SCr of 0.57 mg/dL). Liver Function Tests: Recent Labs  Lab 10/21/19 1905  AST 32  ALT 23  ALKPHOS 141*  BILITOT 0.7  PROT 7.7  ALBUMIN 3.5   No results for input(s): LIPASE, AMYLASE in the last 168 hours. No results for input(s): AMMONIA in the last 168 hours. Coagulation profile No results for input(s): INR, PROTIME in the last 168 hours.  CBC: Recent Labs  Lab 10/21/19 1905 10/22/19 0330 10/23/19 0457  WBC 4.8 3.8* 4.0  NEUTROABS 2.9  --   --   HGB 13.7 11.5*  12.0  HCT 42.2 35.2* 37.1  MCV 92.3 92.1 92.8  PLT PLATELET CLUMPS NOTED ON SMEAR, COUNT APPEARS DECREASED 101* 93*   Cardiac Enzymes: No results for input(s): CKTOTAL, CKMB, CKMBINDEX, TROPONINI in the last 168 hours. BNP (last 3 results) No results for input(s): PROBNP in the last 8760 hours. CBG: No results for input(s): GLUCAP in the last 168 hours. D-Dimer: No results for input(s): DDIMER in the last 72 hours. Hgb A1c: No results for input(s): HGBA1C in the last 72 hours. Lipid Profile: No results for input(s): CHOL, HDL, LDLCALC, TRIG, CHOLHDL, LDLDIRECT in the last 72 hours. Thyroid function studies: No results for input(s): TSH, T4TOTAL, T3FREE, THYROIDAB in the last 72 hours.  Invalid input(s): FREET3 Anemia work up: No results for input(s): VITAMINB12, FOLATE, FERRITIN, TIBC, IRON, RETICCTPCT in the last 72 hours. Sepsis Labs: Recent Labs  Lab 10/21/19 1905 10/21/19 2305 10/22/19 0330 10/23/19 0457  WBC 4.8  --  3.8* 4.0  LATICACIDVEN 1.2 1.5  --   --     Microbiology Recent Results (from the past 240 hour(s))  Blood culture (routine x 2)     Status: None (Preliminary result)   Collection Time: 10/21/19  7:03 PM   Specimen: BLOOD  Result Value Ref Range Status   Specimen Description BLOOD RIGHT ANTECUBITAL  Final   Special Requests   Final    BOTTLES DRAWN AEROBIC AND ANAEROBIC Blood Culture adequate volume   Culture   Final    NO GROWTH 1 DAY Performed at [facility], [place] [address] [place], [place] [address]    Report Status PENDING  Incomplete  Blood culture (routine x 2)     Status: None (Preliminary result)   Collection Time: 10/21/19 11:15 PM   Specimen: BLOOD RIGHT FOREARM  Result Value Ref Range Status   Specimen Description BLOOD RIGHT FOREARM  Final   Special Requests   Final    BOTTLES DRAWN AEROBIC AND ANAEROBIC Blood Culture adequate volume   Culture   Final    NO GROWTH 1 DAY Performed at [facility], [place] [address] [place], [place] [address]    Report Status PENDING  Incomplete  SARS CORONAVIRUS 2 (TAT 6-24 HRS) Nasopharyngeal Nasopharyngeal Swab     Status: None   Collection Time: 10/21/19 11:55 PM   Specimen: Nasopharyngeal Swab  Result Value Ref Range Status   SARS Coronavirus 2 NEGATIVE NEGATIVE Final    Comment: (NOTE) SARS-CoV-2 target nucleic acids are NOT DETECTED. The SARS-CoV-2 RNA is generally detectable in upper and lower respiratory specimens during the acute phase of infection. Negative results do not preclude SARS-CoV-2 infection, do not rule out co-infections with other pathogens, and should not be used as the sole basis  for treatment or other patient management decisions. Negative results must be combined with clinical observations, patient history, and epidemiological information. The expected result is Negative. Fact Sheet for Patients: SugarRoll.be Fact Sheet for Healthcare Providers: https://www.woods-mathews.com/ This test is not yet approved or cleared by the [place] FDA and  has been authorized for detection and/or diagnosis of SARS-CoV-2 by FDA under an Emergency Use Authorization (EUA). This EUA will remain  in effect (meaning this test can be used) for the duration of the COVID-19 declaration under Section 56 4(b)(1) of the Act, 21 U.S.C. section 360bbb-3(b)(1), unless the authorization is terminated or revoked sooner. Performed at [facility], [place] [address] [place], [place] [address]     Procedures and diagnostic studies:  Dg Tibia/fibula Right  Result Date: 10/21/2019 CLINICAL DATA:  Pain, swelling, erythema. Recent injury. Patient reports infection. Worsening pain and swelling. EXAM: RIGHT TIBIA AND FIBULA - 2 VIEW COMPARISON:  No prior tibia/fibular imaging. FINDINGS: Cortical margins of the tibia and fibular intact. No osseous erosions or periosteal reaction. There is no evidence of fracture or other  focal bone lesions. Diffuse soft tissue edema of the lower leg. No soft tissue air or radiopaque foreign body. IMPRESSION: 1. No acute osseous abnormality of the right lower leg. 2. Diffuse subcutaneous soft tissue edema.  No soft tissue air. Electronically Signed   By: Keith Rake M.D.   On: 10/21/2019 23:28   Dg Knee Complete 4 Views Right  Result Date: 10/21/2019 CLINICAL DATA:  Pain, swelling, erythema. Recent injury. Patient reports infection. Worsening pain and swelling. EXAM: RIGHT KNEE - COMPLETE 4+ VIEW COMPARISON:  Knee radiograph 10/11/2019 FINDINGS: No evidence of fracture, dislocation, or joint effusion. No evidence of arthropathy or other focal bone abnormality. Soft tissue thickening anterior to the patellar tendon. Generalized soft tissue edema. IMPRESSION: 1. Similar prepatellar soft tissue thickening to prior exam, suggesting hematoma in the setting of injury. 2. Diffuse increase subcutaneous soft tissue edema. 3. No acute osseous abnormality. Electronically Signed   By: Keith Rake M.D.   On: 10/21/2019 23:30   Dg Foot Complete Right  Result Date: 10/21/2019 CLINICAL DATA:  Pain, swelling, erythema. Recent injury. Patient reports infection. Worsening pain and swelling. EXAM: RIGHT FOOT COMPLETE - 3+ VIEW COMPARISON:  None. FINDINGS: There is no evidence of fracture or dislocation. Prominent plantar calcaneal spur. There is no evidence of arthropathy or other focal bone abnormality. Mild generalized soft tissue edema. No soft tissue air or radiopaque foreign body. IMPRESSION: 1. No acute osseous abnormality. 2. Generalized soft tissue edema. 3. Plantar calcaneal spur. Electronically Signed   By: Keith Rake M.D.   On: 10/21/2019 23:29   Vas [name] Lower Extremity Venous (dvt) (only [facility])  Result Date: 10/23/2019  Lower Venous Study Indications: Pain, Erythema, and cellulitis.  Comparison Study: no prior Performing Technologist: June Leap RDMS, RVT  Examination  Guidelines: A complete evaluation includes B-mode imaging, spectral Doppler, color Doppler, and power Doppler as needed of all accessible portions of each vessel. Bilateral testing is considered an integral part of a complete examination. Limited examinations for reoccurring indications may be performed as noted.  +---------+---------------+---------+-----------+----------+--------------+  RIGHT     Compressibility Phasicity Spontaneity Properties Thrombus Aging  +---------+---------------+---------+-----------+----------+--------------+  CFV       Full            Yes       Yes                                    +---------+---------------+---------+-----------+----------+--------------+  SFJ       Full                                                             +---------+---------------+---------+-----------+----------+--------------+  FV Prox   Full                                                             +---------+---------------+---------+-----------+----------+--------------+  FV Mid    Full                                                             +---------+---------------+---------+-----------+----------+--------------+  FV Distal Full                                                             +---------+---------------+---------+-----------+----------+--------------+  PFV       Full                                                             +---------+---------------+---------+-----------+----------+--------------+  POP       Full            Yes       Yes                                    +---------+---------------+---------+-----------+----------+--------------+  PTV       Full                                                             +---------+---------------+---------+-----------+----------+--------------+  PERO      Full                                                             +---------+---------------+---------+-----------+----------+--------------+     Summary: Right: There is no  evidence of deep vein thrombosis in the lower extremity. No cystic structure found in the popliteal fossa.  *See table(s) above for measurements and observations. Electronically signed by Servando Snare MD on 10/23/2019 at 10:59:07 AM.    Final     Medications:  aspirin EC  81 mg Oral Daily   divalproex  750 mg Oral QHS   heparin  5,000 Units Subcutaneous Q8H   mometasone-formoterol  2 puff Inhalation BID   nortriptyline  100 mg Oral QHS   venlafaxine  75 mg Oral Q breakfast   [START ON 10/25/2019] Vitamin D (Ergocalciferol)  50,000 Units Oral Q Tue   Continuous Infusions:  piperacillin-tazobactam (ZOSYN)  IV 12.5 mL/hr at 10/23/19 0600   vancomycin 1,000 mg (10/23/19 0207)     LOS: 1 day   Geradine Girt  [facility]   How to contact the [facility] Attending or Consulting provider [place] or covering provider during after hours [place], for this patient?  1. Check the care team in [facility] and look for a) attending/consulting [facility] provider listed and b) the [facility] team listed 2. Log into [email or web address] and use [place]'s universal password to access. If you do not have the password, please contact the hospital operator. 3. Locate the [facility] provider you are looking for under [facility] and page to a number that you can be directly reached. 4. If you still have difficulty reaching the provider, please page the [facility] (Director on Call) for the Hospitalists listed on amion for assistance.  10/23/2019, 11:12 AM

## 2019-10-24 ENCOUNTER — Ambulatory Visit: Payer: Medicare Other | Admitting: Specialist

## 2019-10-24 MED ORDER — ADULT MULTIVITAMIN W/MINERALS CH
1.0000 | ORAL_TABLET | Freq: Every day | ORAL | Status: DC
  Administered 2019-10-24 – 2019-10-26 (×3): 1 via ORAL
  Filled 2019-10-24 (×3): qty 1

## 2019-10-24 MED ORDER — ENSURE ENLIVE PO LIQD
237.0000 mL | Freq: Three times a day (TID) | ORAL | Status: DC
  Administered 2019-10-24 – 2019-10-26 (×5): 237 mL via ORAL

## 2019-10-24 NOTE — Evaluation (Signed)
Physical Therapy Evaluation Patient Details Name: Catherine Kelley MRN: [id number] DOB: 1951-04-20 Today's Date: 10/24/2019   History of Present Illness  68yo female with increased edema and erythema R LE after tripping and falling on her RW 10 days ago. Negative for acute fracture or DVT. Admitted for R LE cellulitis. PMH anxiety, bipolar, LBP, COPD, HTN, necrotic pneumonia  Clinical Impression   Patient received in bed, pleasant and motivated to work with PT this morning. Able to complete functional bed mobility with mod(I) and extended time, functional transfers with and without AD and S, and gait approximately 23f with RW and min guard. Gait remains very antalgic with deficits as described below, limited by R LE pain this morning. She was left up in the chair with all needs met and chair alarm active. Feel she should be able to return home with skilled HHPT services moving forward.     Follow Up Recommendations Home health PT;Supervision - Intermittent    Equipment Recommendations  Rolling walker with 5" wheels    Recommendations for Other Services       Precautions / Restrictions Precautions Precautions: Fall;Other (comment) Precaution Comments: painful R LE, impulsive Restrictions Weight Bearing Restrictions: No      Mobility  Bed Mobility Overal bed mobility: Modified Independent             General bed mobility comments: extended time and effort  Transfers Overall transfer level: Needs assistance Equipment used: Rolling walker (2 wheeled) Transfers: Sit to/from Stand Sit to Stand: Supervision         General transfer comment: cues for hand placement and safety  Ambulation/Gait Ambulation/Gait assistance: Min guard Gait Distance (Feet): 15 Feet Assistive device: Rolling walker (2 wheeled) Gait Pattern/deviations: Step-through pattern;Decreased step length - left;Decreased stance time - right;Decreased dorsiflexion - right;Decreased stride  length;Antalgic Gait velocity: decreased   General Gait Details: antalgic pattern even with RW, gait distance limited by pain  Stairs            Wheelchair Mobility    Modified Rankin (Stroke Patients Only)       Balance Overall balance assessment: Mild deficits observed, not formally tested                                           Pertinent Vitals/Pain Pain Assessment: 0-10 Pain Score: 7  Pain Location: R LE especially when in depedent position Pain Descriptors / Indicators: Aching;Discomfort Pain Intervention(s): Limited activity within patient's tolerance;Monitored during session;Patient requesting pain meds-RN notified    Home Living Family/patient expects to be discharged to:: Private residence Living Arrangements: Spouse/significant other Available Help at Discharge: Family Type of Home: Mobile home Home Access: Stairs to enter Entrance Stairs-Rails: Can reach both Entrance Stairs-Number of Steps: 4 Home Layout: One level Home Equipment: Bedside commode;Grab bars - toilet;Shower seat;Walker - 4 wheels      Prior Function Level of Independence: Independent with assistive device(s)         Comments: pretty independent, doesn't go outside much due to being worried about animals like mountain lions and bears in the woods     Hand Dominance   Dominant Hand: Right    Extremity/Trunk Assessment   Upper Extremity Assessment Upper Extremity Assessment: Generalized weakness    Lower Extremity Assessment Lower Extremity Assessment: Generalized weakness    Cervical / Trunk Assessment Cervical / Trunk Assessment: Kyphotic  Communication  Communication: No difficulties  Cognition Arousal/Alertness: Awake/alert Behavior During Therapy: Impulsive;Anxious Overall Cognitive Status: Within Functional Limits for tasks assessed                                 General Comments: A&Ox4, anxious and a bit impulsive but feel  this is likely due to wanting to potentially DC today      General Comments      Exercises     Assessment/Plan    PT Assessment Patient needs continued PT services  PT Problem List Decreased strength;Decreased activity tolerance;Decreased safety awareness;Decreased balance;Pain;Decreased mobility;Decreased coordination       PT Treatment Interventions DME instruction;Balance training;Gait training;Neuromuscular re-education;Stair training;Functional mobility training;Patient/family education;Therapeutic exercise;Therapeutic activities    PT Goals (Current goals can be found in the Care Plan section)  Acute Rehab PT Goals Patient Stated Goal: less pain, maybe go home today PT Goal Formulation: With patient Time For Goal Achievement: 11/07/19 Potential to Achieve Goals: Good    Frequency Min 3X/week   Barriers to discharge        Co-evaluation               AM-PAC PT "6 Clicks" Mobility  Outcome Measure Help needed turning from your back to your side while in a flat bed without using bedrails?: None Help needed moving from lying on your back to sitting on the side of a flat bed without using bedrails?: None Help needed moving to and from a bed to a chair (including a wheelchair)?: A Little Help needed standing up from a chair using your arms (e.g., wheelchair or bedside chair)?: A Little Help needed to walk in hospital room?: A Little Help needed climbing 3-5 steps with a railing? : A Lot 6 Click Score: 19    End of Session Equipment Utilized During Treatment: Gait belt Activity Tolerance: Patient tolerated treatment well;Patient limited by pain Patient left: in chair;with call bell/phone within reach;with chair alarm set Nurse Communication: Mobility status PT Visit Diagnosis: Unsteadiness on feet (R26.81);History of falling (Z91.81);Muscle weakness (generalized) (M62.81);Difficulty in walking, not elsewhere classified (R26.2);Pain Pain - Right/Left: Right Pain  - part of body: Leg    Time: 9295-7473 PT Time Calculation (min) (ACUTE ONLY): 20 min   Charges:   PT Evaluation $PT Eval Low Complexity: 1 Low          Windell Norfolk, DPT, PN1   Supplemental Physical Therapist [place]    Pager [phone] Acute Rehab Office [phone]

## 2019-10-24 NOTE — Progress Notes (Signed)
[facility] PROGRESS NOTE  Catherine Kelley KTG:[id number] DOB: Feb 15, 1951 DOA: 10/21/2019 PCP: Rutherford Guys, MD  Assessment/Plan: Cellulitis of the right lower extremity. failed outpatient abx of doxy. occurred after mechanical fall-patient's x-rays do show possibility of a small hematoma on the right knee area. There is soft tissue edema in the x-ray seen in the leg. only slight improvement to LE today. Knee swelling down but remains erythemic. rfith ankle lateral posterior aspect with swelling, erythema heat and tenderness. ROM diminished.  -IV abx -blood cultures NGTD -elevate extremity -duplex negative for DVT  COPD - stable. No wheeze -Continue inhalers.  Chronic thrombocytopenia. stable -follow CBC  History of carotid stent placement  -on aspirin  Bipolar disorder stable at baseline -on Depakote and Effexor  Hypertension fair control. Home meds include norvasc, spironolactone. Home meds on hold for now -resume home meds as indicated  Tobacco abuse -tobacco cessation counseling provided   Code Status: full Family Communication:  Disposition Plan: home hopefully tomorrow   Consultants:    Procedures:    Antibiotics:  Vancomycin 11/22>>  Zosyn 11/22>>>  HPI/Subjective: Lying in bed with eyes closed. Arouses to verbal stimuli. Denies pain/discomfort except when turning ankle.   Objective: Vitals:   10/24/19 0508 10/24/19 0822  BP: 126/66 (!) 117/52  Pulse: 78 86  Resp: 16 18  Temp: 98.3 F (36.8 C) 97.7 F (36.5 C)  SpO2: 92%     Intake/Output Summary (Last 24 hours) at 10/24/2019 1222 Last data filed at 10/24/2019 0900 Gross per 24 hour  Intake 1212.03 ml  Output 0 ml  Net 1212.03 ml   Filed Weights   10/22/19 1256 10/23/19 0554 10/24/19 0508  Weight: 52.9 kg 53.1 kg 51.8 kg    Exam:   General:  Awake alert no acute distress  Cardiovascular: rrr no mgr no right leg with swelling/erythema. Trace LE edema on  left  Respiratory: normal effort BS clear bilaterally no wheeze  Abdomen: non distended non tender +BS no guarding or rebounding  Musculoskeletal: right LE with swelling/erythema pain at ankle up to shin.    Data Reviewed: Basic Metabolic Panel: Recent Labs  Lab 10/21/19 1905 10/22/19 0330 10/23/19 0457  NA 135 136 135  K 3.7 3.4* 4.3  CL 100 102 102  CO2 24 26 23   GLUCOSE 89 91 82  BUN 11 11 8   CREATININE 0.94 0.77 0.57  CALCIUM 9.0 8.7* 8.7*   Liver Function Tests: Recent Labs  Lab 10/21/19 1905  AST 32  ALT 23  ALKPHOS 141*  BILITOT 0.7  PROT 7.7  ALBUMIN 3.5   No results for input(s): LIPASE, AMYLASE in the last 168 hours. No results for input(s): AMMONIA in the last 168 hours. CBC: Recent Labs  Lab 10/21/19 1905 10/22/19 0330 10/23/19 0457  WBC 4.8 3.8* 4.0  NEUTROABS 2.9  --   --   HGB 13.7 11.5* 12.0  HCT 42.2 35.2* 37.1  MCV 92.3 92.1 92.8  PLT PLATELET CLUMPS NOTED ON SMEAR, COUNT APPEARS DECREASED 101* 93*   Cardiac Enzymes: No results for input(s): CKTOTAL, CKMB, CKMBINDEX, TROPONINI in the last 168 hours. BNP (last 3 results) No results for input(s): BNP in the last 8760 hours.  ProBNP (last 3 results) No results for input(s): PROBNP in the last 8760 hours.  CBG: No results for input(s): GLUCAP in the last 168 hours.  Recent Results (from the past 240 hour(s))  Blood culture (routine x 2)     Status: None (Preliminary result)  Collection Time: 10/21/19  7:03 PM   Specimen: BLOOD  Result Value Ref Range Status   Specimen Description BLOOD RIGHT ANTECUBITAL  Final   Special Requests   Final    BOTTLES DRAWN AEROBIC AND ANAEROBIC Blood Culture adequate volume   Culture   Final    NO GROWTH 2 DAYS Performed at [facility], [address], [place], [place] [address]    Report Status PENDING  Incomplete  Blood culture (routine x 2)     Status: None (Preliminary result)   Collection Time: 10/21/19 11:15 PM   Specimen: BLOOD  RIGHT FOREARM  Result Value Ref Range Status   Specimen Description BLOOD RIGHT FOREARM  Final   Special Requests   Final    BOTTLES DRAWN AEROBIC AND ANAEROBIC Blood Culture adequate volume   Culture   Final    NO GROWTH 2 DAYS Performed at [facility], [place] [address], [place], [place] [address]    Report Status PENDING  Incomplete  SARS CORONAVIRUS 2 (TAT 6-24 HRS) Nasopharyngeal Nasopharyngeal Swab     Status: None   Collection Time: 10/21/19 11:55 PM   Specimen: Nasopharyngeal Swab  Result Value Ref Range Status   SARS Coronavirus 2 NEGATIVE NEGATIVE Final    Comment: (NOTE) SARS-CoV-2 target nucleic acids are NOT DETECTED. The SARS-CoV-2 RNA is generally detectable in upper and lower respiratory specimens during the acute phase of infection. Negative results do not preclude SARS-CoV-2 infection, do not rule out co-infections with other pathogens, and should not be used as the sole basis for treatment or other patient management decisions. Negative results must be combined with clinical observations, patient history, and epidemiological information. The expected result is Negative. Fact Sheet for Patients: SugarRoll.be Fact Sheet for Healthcare Providers: https://www.woods-mathews.com/ This test is not yet approved or cleared by the [place] FDA and  has been authorized for detection and/or diagnosis of SARS-CoV-2 by FDA under an Emergency Use Authorization (EUA). This EUA will remain  in effect (meaning this test can be used) for the duration of the COVID-19 declaration under Section 56 4(b)(1) of the Act, 21 U.S.C. section 360bbb-3(b)(1), unless the authorization is terminated or revoked sooner. Performed at [facility], [place] [address] [place], [place] [address]      Studies: Vas [name] Lower Extremity Venous (dvt) (only [facility])  Result Date: 10/23/2019  Lower Venous Study Indications: Pain, Erythema, and  cellulitis.  Comparison Study: no prior Performing Technologist: June Leap RDMS, RVT  Examination Guidelines: A complete evaluation includes B-mode imaging, spectral Doppler, color Doppler, and power Doppler as needed of all accessible portions of each vessel. Bilateral testing is considered an integral part of a complete examination. Limited examinations for reoccurring indications may be performed as noted.  +---------+---------------+---------+-----------+----------+--------------+ RIGHT    CompressibilityPhasicitySpontaneityPropertiesThrombus Aging +---------+---------------+---------+-----------+----------+--------------+ CFV      Full           Yes      Yes                                 +---------+---------------+---------+-----------+----------+--------------+ SFJ      Full                                                        +---------+---------------+---------+-----------+----------+--------------+  FV Prox  Full                                                        +---------+---------------+---------+-----------+----------+--------------+ FV Mid   Full                                                        +---------+---------------+---------+-----------+----------+--------------+ FV DistalFull                                                        +---------+---------------+---------+-----------+----------+--------------+ PFV      Full                                                        +---------+---------------+---------+-----------+----------+--------------+ POP      Full           Yes      Yes                                 +---------+---------------+---------+-----------+----------+--------------+ PTV      Full                                                        +---------+---------------+---------+-----------+----------+--------------+ PERO     Full                                                         +---------+---------------+---------+-----------+----------+--------------+     Summary: Right: There is no evidence of deep vein thrombosis in the lower extremity. No cystic structure found in the popliteal fossa.  *See table(s) above for measurements and observations. Electronically signed by Servando Snare MD on 10/23/2019 at 10:59:07 AM.    Final     Scheduled Meds: . aspirin EC  81 mg Oral Daily  . divalproex  750 mg Oral QHS  . feeding supplement (ENSURE ENLIVE)  237 mL Oral TID BM  . heparin  5,000 Units Subcutaneous Q8H  . mometasone-formoterol  2 puff Inhalation BID  . multivitamin with minerals  1 tablet Oral Daily  . nortriptyline  100 mg Oral QHS  . venlafaxine  75 mg Oral Q breakfast  . [START ON 10/25/2019] Vitamin D (Ergocalciferol)  50,000 Units Oral Q Tue   Continuous Infusions: . piperacillin-tazobactam (ZOSYN)  IV 12.5 mL/hr at 10/24/19 0600  . vancomycin Stopped (10/24/19 0434)    Principal Problem:   Cellulitis of right lower extremity Active Problems:  Essential hypertension   Stage 2 moderate COPD by GOLD classification ([place])   Bipolar I disorder ([place])    Time spent: 92 minutes    [facility] M NP  [facility]  If 7PM-7AM, please contact night-coverage at [email or web address], password [email or web address] 10/24/2019, 12:22 PM  LOS: 2 days

## 2019-10-24 NOTE — TOC Initial Note (Signed)
Transition of Care [facility]) - Initial/Assessment Note    Patient Details  Name: Catherine Kelley MRN: [id number] Date of Birth: October 12, 1951  Transition of Care [facility] CM/SW Contact:    Bartholomew Crews, RN Phone Number:  [phone] 10/24/2019, 1:19 PM  Clinical Narrative:                 Spoke with patient at the bedside. PTA home with spouse. States spouse works during the day, but gets home about 5:30pm. Has DME - RW, 3N1 in the home. Discussed HH PT - patient agreeable. Offered choice. Referral accepted by [facility]. Patient will need HH orders for PT with face to face. No problems with medications. States husband will provide transportation home once discharged.   Expected Discharge Plan: [place] Barriers to Discharge: Continued Medical Work up   Patient Goals and CMS Choice   CMS Medicare.gov Compare Post Acute Care list provided to:: Patient Choice offered to / list presented to : Patient  Expected Discharge Plan and Services Expected Discharge Plan: [place] In-house Referral: NA Discharge Planning Services: CM Consult Post Acute Care Choice: [place] arrangements for the past 2 months: Single Family Home                 DME Arranged: N/A DME Agency: NA       HH Arranged: PT [place] Agency: [place] ([place]) Date HH Agency Contacted: 10/24/19 Time HH Agency Contacted: 1300 Representative spoke with at [place]: Butch Penny  Prior Living Arrangements/Services Living arrangements for the past 2 months: [place] Lives with:: Self, Spouse Patient language and need for interpreter reviewed:: Yes Do you feel safe going back to the place where you live?: Yes      Need for Family Participation in Patient Care: Yes (Comment) Care giver support system in place?: Yes (comment) Current home services: DME Criminal Activity/Legal Involvement Pertinent to Current Situation/Hospitalization: No - Comment as  needed  Activities of Daily Living Home Assistive Devices/Equipment: Gilford Rile (specify type) ADL Screening (condition at time of admission) Patient's cognitive ability adequate to safely complete daily activities?: Yes Is the patient deaf or have difficulty hearing?: Yes Does the patient have difficulty seeing, even when wearing glasses/contacts?: No Does the patient have difficulty concentrating, remembering, or making decisions?: No Patient able to express need for assistance with ADLs?: No Does the patient have difficulty dressing or bathing?: No Independently performs ADLs?: Yes (appropriate for developmental age) Does the patient have difficulty walking or climbing stairs?: Yes Weakness of Legs: Both Weakness of Arms/Hands: Both  Permission Sought/Granted                  Emotional Assessment Appearance:: Appears stated age Attitude/Demeanor/Rapport: Engaged Affect (typically observed): Accepting Orientation: : Oriented to Self, Oriented to Place, Oriented to  Time, Oriented to Situation Alcohol / Substance Use: Not Applicable Psych Involvement: No (comment)  Admission diagnosis:  Injury [T14.90XA] Cellulitis [L03.90] Patient Active Problem List   Diagnosis Date Noted  . Cellulitis of right lower extremity 10/22/2019  . Cellulitis 10/21/2019  . Bipolar I disorder ([place]) 10/11/2019  . Tobacco dependence due to cigarettes 10/11/2019  . Fall as cause of accidental injury at home as place of occurrence 10/11/2019  . Traumatic hematoma of knee, right, initial encounter 10/11/2019  . Lower respiratory infection 12/22/2018  . Senile ecchymosis 09/11/2018  . CAP (community acquired pneumonia) 09/17/2017  . Near syncope 09/17/2017  . Tobacco abuse  02/12/2016  . Other fatigue 02/04/2016  . Respiratory failure with hypoxia ([place]) 01/09/2016  . Malnutrition of moderate degree 11/03/2015  . Pulmonary emphysema ([place])   . Essential hypertension 09/27/2015  . Rhabdomyolysis  08/16/2015  . Cough 06/07/2015  . Bronchiectasis with acute exacerbation ([place]) 03/07/2015  . Cigarette smoker 12/13/2014  . Stage 2 moderate COPD by GOLD classification ([place]) 10/11/2014  . Lumbar back pain with radiculopathy affecting right lower extremity 09/28/2014  . Hyponatremia 07/09/2014  . Hx of recurrent pneumonia 07/08/2014  . Venous insufficiency (chronic) (peripheral) 09/29/2012  . Lower extremity edema 03/09/2012  . Benign hypertensive heart disease without heart failure 05/09/2011  . Chest pain 05/09/2011  . Dyslipidemia 05/09/2011  . Mood disorder ([place]) 05/09/2011  . Osteoarthritis 05/09/2011   PCP:  Rutherford Guys, MD Pharmacy:   [id number] - [place], [place] [address] Phone: [phone] Fax: [phone]     Social Determinants of Health (SDOH) Interventions    Readmission Risk Interventions No flowsheet data found.

## 2019-10-24 NOTE — Progress Notes (Signed)
Initial Nutrition Assessment  DOCUMENTATION CODES:   Not applicable  INTERVENTION:    Ensure Enlive po TID, each supplement provides 350 kcal and 20 grams of protein  MVI daily   NUTRITION DIAGNOSIS:   Inadequate oral intake related to decreased appetite as evidenced by per patient/family report.  GOAL:   Patient will meet greater than or equal to 90% of their needs  MONITOR:   PO intake, Weight trends, Labs, I & O's, Supplement acceptance  REASON FOR ASSESSMENT:   Consult Assessment of nutrition requirement/status  ASSESSMENT:   Patient with PMH significant for COPD, HTN, bipolar disorder, and s/p carotid stents. Presents this admission with cellulitis of R lower extremity after mechanical fall.   RD working remotely.  Pt reports having a loss of appetite for 2-3 month PTA due to unknown reasons. States during this time she would attempt to consume three meals daily but could only finish ~25% of each. She does not use supplementation at home. Appetite is progressing this admission. Meal completions charted as 100% for her last two meals. Discussed the importance of protein intake for preservation of lean body mass. Pt enjoys Ensure.   Pt endorses a UBW of 120 lb and a recent wt loss of 10 lb. Records indicate pt weighed 124 lb on 5/15 and 114 this admission (8% wt loss in six month, insignificant for time frame). Pt with malnutrition diagnosis in the past. Suspect this continues but unable to diagnose without NFPE.    Medications: Vitamin D Labs: reviewed   Diet Order:   Diet Order            Diet Heart Room service appropriate? Yes; Fluid consistency: Thin  Diet effective now              EDUCATION NEEDS:   Education needs have been addressed  Skin:  Skin Assessment: Reviewed RN Assessment  Last BM:  11/20  Height:   Ht Readings from Last 1 Encounters:  10/18/19 5\' 3"  (1.6 m)    Weight:   Wt Readings from Last 1 Encounters:  10/24/19 51.8 kg     Ideal Body Weight:  52.3 kg  BMI:  Body mass index is 20.25 kg/m.  Estimated Nutritional Needs:   Kcal:  1550-1650 kcal  Protein:  75-90 grams  Fluid:  >/= 1.5 L/day   Mariana Single RD, LDN Clinical Nutrition Pager # - [phone]

## 2019-10-25 NOTE — Progress Notes (Addendum)
Pharmacy Antibiotic Note  Catherine Kelley is a 68 y.o. female admitted on 10/21/2019 with RLE  cellulitis, failed outpatient Doxy. Pharmacy consulted 10/22/19  for Vancomycin/Zosyn dosing. SCr improved to 0.77 then to 0.57 on 10/23/19.  Urine occurrence 4x.  MD ordered BMET for tomorrow AM.   Blood Cx no growth to date x 2 days.  Afebrile, Tm 98.8, WBC down to 4.0 (11/22), LA 1.5  Dr. Eliseo Squires discontinued Zosyn this AM, narrowed to vancomycin with plan to tranistion to oral antibiotic tomorrow.    Plan: Continue Vancomycin to 1000 mg IV Q 24 hrs  Goal AUC 400-550. Expected AUC: 524, SCr used: 0.8 Zosyn discontinued this AM Monitor clinical progress, c/s, renal function F/u de-escalation plan/LOT, vancomycin levels per protocol if not changed to oral antibiotic in next few days.  Temp (24hrs), Avg:98.2 F (36.8 C), Min:97.8 F (36.6 C), Max:98.7 F (37.1 C)  Recent Labs  Lab 10/21/19 1905 10/21/19 2305 10/22/19 0330 10/23/19 0457  WBC 4.8  --  3.8* 4.0  CREATININE 0.94  --  0.77 0.57  LATICACIDVEN 1.2 1.5  --   --     Estimated Creatinine Clearance: 54.3 mL/min (by C-G formula based on SCr of 0.57 mg/dL).    Allergies  Allergen Reactions  . Sulfa Drugs Cross Reactors Hives and Rash  . Ceclor [Cefaclor] Hives    Tolerated ceftazidime December 2016  . Depakote Er [Divalproex Sodium Er] Other (See Comments)    Patient remarked that when she was taking this TWICE a day, she became "clumsy" and felt more prone to stumbling  . Escitalopram Oxalate Other (See Comments)    Pt does not recall ever taking medication (??)  . Latex Itching  . Sertraline Hcl Other (See Comments)    Severe headaches   . Tape Itching and Rash    Band-Aids, also (PAPER TAPE IS TOLERATED)   Antimicrobials this admission:  11/21 vanc>> 11/21 zosyn>>11/24 11/20 clinda x 1   Microbiology:  11/20 covid - neg 11/20 BCx - ngtd x2    Nicole Cella, RPh Clinical Pharmacist Please check AMION for all [place]  contact numbers Clinical Pharmacist 10/25/2019 11:18 AM

## 2019-10-25 NOTE — Progress Notes (Signed)
Progress Note    Catherine Kelley  EXH:[id number] DOB: December 07, 1950  DOA: 10/21/2019 PCP: Rutherford Guys, MD    Brief Narrative:    Medical records reviewed and are as summarized below:  Catherine Kelley is an 68 y.o. female with history of COPD with ongoing tobacco abuse, hypertension, bipolar disorder, history of carotid stent presents to the ER with worsening swelling of the right lower extremity.  Patient had a fall after tripping on a walker about 10 days ago and had injured her right knee and had a small laceration and a small hematoma at that time.  Followed up with her primary care physician.  Initially had x-rays and was treated conservatively.  Few days later patient started developing erythema and swelling for which patient was placed on doxycycline which patient has been taking it for last 5 days.  Despite which patient swelling has worsened.  Has become more erythematous and swelling and erythema has started moving distally involving the whole right leg from the knee downwards.   Assessment/Plan:   Principal Problem:   Cellulitis of right lower extremity Active Problems:   Stage 2 moderate COPD by GOLD classification (HCC)   Essential hypertension   Bipolar I disorder (HCC)    Cellulitis of the right lower extremity -failed outpatient abx of doxy -occurred after mechanical fall  -patient's x-rays do show possibility of a small hematoma on the right knee area.  There is soft tissue edema in the x-ray seen in the leg.   -IV abx- narrow to vanc-- change to PO in the AM -blood cultures NGTD -elevate extremity -duplex negative for DVT  COPD - stable -Continue inhalers.  Chronic thrombocytopenia  -follow CBC in AM  History of carotid stent placement  -on aspirin  Bipolar disorder  -on Depakote and Effexor  Hypertension  -resume home meds as able  Tobacco abuse  -tobacco cessation counseling provided   Family Communication/Anticipated D/C date and plan/Code Status    DVT prophylaxis: heparin Code Status: Full Code.  Family Communication:  Disposition Plan: suspect home in the AM   Medical Consultants:    None.   Subjective:  Leg is feeling better  Objective:    Vitals:   10/24/19 1932 10/24/19 2033 10/25/19 0553 10/25/19 0946  BP:  131/72 128/70 (!) 142/72  Pulse:  92 90 85  Resp:  18 18 18   Temp:  97.8 F (36.6 C) 97.9 F (36.6 C) 98.7 F (37.1 C)  TempSrc:   Oral   SpO2: 93% 95% 95% 92%  Weight:        Intake/Output Summary (Last 24 hours) at 10/25/2019 1113 Last data filed at 10/25/2019 0900 Gross per 24 hour  Intake 600 ml  Output 0 ml  Net 600 ml   Filed Weights   10/22/19 1256 10/23/19 0554 10/24/19 0508  Weight: 52.9 kg 53.1 kg 51.8 kg    Exam: In bed, NAD rrr No increased work of breathing Lessened edema and erythema of lower extremity    Data Reviewed:   I have personally reviewed following labs and imaging studies:  Labs: Labs show the following:   Basic Metabolic Panel: Recent Labs  Lab 10/21/19 1905 10/22/19 0330 10/23/19 0457  NA 135 136 135  K 3.7 3.4* 4.3  CL 100 102 102  CO2 24 26 23   GLUCOSE 89 91 82  BUN 11 11 8   CREATININE 0.94 0.77 0.57  CALCIUM 9.0 8.7* 8.7*   GFR Estimated Creatinine Clearance: 54.3  mL/min (by C-G formula based on SCr of 0.57 mg/dL). Liver Function Tests: Recent Labs  Lab 10/21/19 1905  AST 32  ALT 23  ALKPHOS 141*  BILITOT 0.7  PROT 7.7  ALBUMIN 3.5   No results for input(s): LIPASE, AMYLASE in the last 168 hours. No results for input(s): AMMONIA in the last 168 hours. Coagulation profile No results for input(s): INR, PROTIME in the last 168 hours.  CBC: Recent Labs  Lab 10/21/19 1905 10/22/19 0330 10/23/19 0457  WBC 4.8 3.8* 4.0  NEUTROABS 2.9  --   --   HGB 13.7 11.5* 12.0  HCT 42.2 35.2* 37.1  MCV 92.3 92.1 92.8  PLT PLATELET CLUMPS NOTED ON SMEAR, COUNT APPEARS DECREASED 101* 93*   Cardiac Enzymes: No results for input(s):  CKTOTAL, CKMB, CKMBINDEX, TROPONINI in the last 168 hours. BNP (last 3 results) No results for input(s): PROBNP in the last 8760 hours. CBG: No results for input(s): GLUCAP in the last 168 hours. D-Dimer: No results for input(s): DDIMER in the last 72 hours. Hgb A1c: No results for input(s): HGBA1C in the last 72 hours. Lipid Profile: No results for input(s): CHOL, HDL, LDLCALC, TRIG, CHOLHDL, LDLDIRECT in the last 72 hours. Thyroid function studies: No results for input(s): TSH, T4TOTAL, T3FREE, THYROIDAB in the last 72 hours.  Invalid input(s): FREET3 Anemia work up: No results for input(s): VITAMINB12, FOLATE, FERRITIN, TIBC, IRON, RETICCTPCT in the last 72 hours. Sepsis Labs: Recent Labs  Lab 10/21/19 1905 10/21/19 2305 10/22/19 0330 10/23/19 0457  WBC 4.8  --  3.8* 4.0  LATICACIDVEN 1.2 1.5  --   --     Microbiology Recent Results (from the past 240 hour(s))  Blood culture (routine x 2)     Status: None (Preliminary result)   Collection Time: 10/21/19  7:03 PM   Specimen: BLOOD  Result Value Ref Range Status   Specimen Description BLOOD RIGHT ANTECUBITAL  Final   Special Requests   Final    BOTTLES DRAWN AEROBIC AND ANAEROBIC Blood Culture adequate volume   Culture   Final    NO GROWTH 2 DAYS Performed at [facility], [address], [place], [place] [address]    Report Status PENDING  Incomplete  Blood culture (routine x 2)     Status: None (Preliminary result)   Collection Time: 10/21/19 11:15 PM   Specimen: BLOOD RIGHT FOREARM  Result Value Ref Range Status   Specimen Description BLOOD RIGHT FOREARM  Final   Special Requests   Final    BOTTLES DRAWN AEROBIC AND ANAEROBIC Blood Culture adequate volume   Culture   Final    NO GROWTH 2 DAYS Performed at [facility], [place] [address] [place], [place] [address]    Report Status PENDING  Incomplete  SARS CORONAVIRUS 2 (TAT 6-24 HRS) Nasopharyngeal Nasopharyngeal Swab     Status: None   Collection  Time: 10/21/19 11:55 PM   Specimen: Nasopharyngeal Swab  Result Value Ref Range Status   SARS Coronavirus 2 NEGATIVE NEGATIVE Final    Comment: (NOTE) SARS-CoV-2 target nucleic acids are NOT DETECTED. The SARS-CoV-2 RNA is generally detectable in upper and lower respiratory specimens during the acute phase of infection. Negative results do not preclude SARS-CoV-2 infection, do not rule out co-infections with other pathogens, and should not be used as the sole basis for treatment or other patient management decisions. Negative results must be combined with clinical observations, patient history, and epidemiological information. The expected result is  Negative. Fact Sheet for Patients: SugarRoll.be Fact Sheet for Healthcare Providers: https://www.woods-mathews.com/ This test is not yet approved or cleared by the [place] FDA and  has been authorized for detection and/or diagnosis of SARS-CoV-2 by FDA under an Emergency Use Authorization (EUA). This EUA will remain  in effect (meaning this test can be used) for the duration of the COVID-19 declaration under Section 56 4(b)(1) of the Act, 21 U.S.C. section 360bbb-3(b)(1), unless the authorization is terminated or revoked sooner. Performed at [facility], [place] [address], [place], [place] [address]     Procedures and diagnostic studies:  No results found.  Medications:   . aspirin EC  81 mg Oral Daily  . divalproex  750 mg Oral QHS  . feeding supplement (ENSURE ENLIVE)  237 mL Oral TID BM  . heparin  5,000 Units Subcutaneous Q8H  . mometasone-formoterol  2 puff Inhalation BID  . multivitamin with minerals  1 tablet Oral Daily  . nortriptyline  100 mg Oral QHS  . venlafaxine  75 mg Oral Q breakfast  . Vitamin D (Ergocalciferol)  50,000 Units Oral Q Tue   Continuous Infusions: . piperacillin-tazobactam (ZOSYN)  IV 3.375 g (10/25/19 0602)  . vancomycin 1,000 mg (10/25/19  0317)     LOS: 3 days   Geradine Girt  [facility]   How to contact the [facility] Attending or Consulting provider [place] or covering provider during after hours [place], for this patient?  1. Check the care team in [facility] and look for a) attending/consulting TR[facility] provider listed and b) the [facility] team listed 2. Log into [email or web address] and use [place]'s universal password to access. If you do not have the password, please contact the hospital operator. 3. Locate the [facility] provider you are looking for under [facility] and page to a number that you can be directly reached. 4. If you still have difficulty reaching the provider, please page the [facility] (Director on Call) for the Hospitalists listed on amion for assistance.  10/25/2019, 11:13 AM

## 2019-10-25 NOTE — Progress Notes (Signed)
Established Patient Office Visit  Subjective:  Patient ID: Catherine Kelley, female    DOB: January 27, 1951  Age: 68 y.o. MRN: [id number]  CC:  Chief Complaint  Patient presents with  . Knee Pain    Pt stated---rt knee and leg ---still swollen/redness/hot sensation.    HPI Catherine Kelley presents for increased redness, warmth, and pain to her right lower extremity.  Previously had a hematoma to patella and states she has been elevating and icing as recommended.  Has had cellulitis before.  Skin is not broken or weeping yet.  She is having some early cellulitis of the left leg.  I had a discussion with the patient about going ot the hospital as the erythema extends up to her knees and she became very combative about the possibility of going.  She is scared due to Covid.   Past Medical History:  Diagnosis Date  . Anxiety   . Arthritis    "jooints" (09/18/2017)  . Asthma   . Bipolar disorder ([place])   . Bipolar I disorder ([place]) 10/11/2019  . CAP (community acquired pneumonia) 09/18/2017  . Cat allergies   . Chronic bronchitis ([place])    "get it alot; maybe not q yr" (07/06/2014)  . Chronic lower back pain    "spurs and pinched nerves" (09/18/2017)  . COPD (chronic obstructive pulmonary disease) ([place])   . DDD (degenerative disc disease), lumbar   . Depression    psychiatrist Dr. Toy Care  . Diastolic dysfunction   . Emphysematous COPD ([place])    changes in right base along with patchy areas on last x-ray  . Fall as cause of accidental injury at home as place of occurrence 10/11/2019  . Fall involving wheelchair as cause of accidental injury 10/11/2019  . Falls frequently    "several in 2017; fell at dr's office yesterday" (09/18/2017)  . GERD (gastroesophageal reflux disease)   . HCAP (healthcare-associated pneumonia) 02/12/2016  . Heart murmur   . History of cardiovascular stress test 08/15/2004   EF of 70% -- Normal stress cardiolite.  There is no evidence of ischemia and there is normal LV  function. -- [name]. MD  . History of echocardiogram 12/24/2006   Est. EF of 65-68% NORMAL LV SYSTOLIC FUNCTION WITH IMPAIRED RELAXATION -- MILD AORTIC SCLEROSIS -- NORMAL PALONARY ARTERY PRESSURE -- NO OLD ECHOS FOR COMPARISON -- [name], MD  . Hypertension    essential hypertension  . Migraine    "when I was having my periods; none since then" (09/18/2017)  . Necrotic pneumonia ([place])    recurrent/notes 02/12/2016  . Pneumonia    "several times since May 2015" (07/06/2014)  . Pollen allergies   . Tobacco abuse    smoking about 1/2 pk of cigarettes a day  . Tobacco dependence due to cigarettes 10/11/2019  . Traumatic hematoma of knee, right, initial encounter 10/11/2019    Past Surgical History:  Procedure Laterality Date  . CAROTID STENT    . DILATION AND CURETTAGE OF UTERUS    . TUBAL LIGATION  1986    Family History  Problem Relation Age of Onset  . Stroke Mother   . Heart disease Father   . Hyperlipidemia Father   . Hypertension Father   . Breast cancer Maternal Aunt   . Asthma Maternal Grandmother     Social History   Socioeconomic History  . Marital status: Married    Spouse name: richard  . Number of children: 2  . Years of  education: Not on file  . Highest education level: Associate degree: occupational, Hotel manager, or vocational program  Occupational History  . Occupation: Chemical engineer: [employer]    Comment: [employer]  Social Needs  . Financial resource strain: Somewhat hard  . Food insecurity    Worry: Sometimes true    Inability: Sometimes true  . Transportation needs    Medical: No    Non-medical: No  Tobacco Use  . Smoking status: Current Every Day Smoker    Packs/day: 0.50    Years: 46.00    Pack years: 23.00    Types: Cigarettes  . Smokeless tobacco: Never Used  Substance and Sexual Activity  . Alcohol use: Yes    Alcohol/week: 0.0 standard drinks    Comment: 09/18/2017 "might have 1 drink/month; if that"  .  Drug use: No  . Sexual activity: Not Currently  Lifestyle  . Physical activity    Days per week: 0 days    Minutes per session: 0 min  . Stress: Very much  Relationships  . Social Herbalist on phone: Not on file    Gets together: Not on file    Attends religious service: More than 4 times per year    Active member of club or organization: No    Attends meetings of clubs or organizations: Never    Relationship status: Married  . Intimate partner violence    Fear of current or ex partner: No    Emotionally abused: No    Physically abused: No    Forced sexual activity: No  Other Topics Concern  . Not on file  Social History Narrative   Patient currently lives with her husband.    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 No bird or hot tub exposure. No mold exposure. No recent travel.    No facility-administered medications prior to visit.    Outpatient Medications Prior to Visit  Medication Sig Dispense Refill  . amLODipine (NORVASC) 2.5 MG tablet Take 1 tablet (2.5 mg total) by mouth daily. 90 tablet 1  . aspirin EC 81 MG tablet Take 81 mg by mouth daily as needed.     . budesonide-formoterol (SYMBICORT) 160-4.5 MCG/ACT inhaler Inhale 2 puffs into the lungs 2 (two) times daily. 1 Inhaler 12  . calcium carbonate (TUMS EX) 750 MG chewable tablet Chew 1 tablet by mouth 2 (two) times daily as needed for heartburn.    . divalproex (DEPAKOTE ER) 250 MG 24 hr tablet Take 3 tablets (750 mg total) by mouth at bedtime. 90 tablet 2  . fluticasone (FLONASE) 50 MCG/ACT nasal spray Place 2 sprays into both nostrils daily. 16 g 6  . HYDROcodone-acetaminophen (NORCO/VICODIN) 5-325 MG tablet Take 1-2 tablets by mouth every 6 (six) hours as needed for up to 30 doses for moderate pain. 30 tablet 0  . hydrOXYzine (ATARAX/VISTARIL) 10 MG tablet Take 1 tablet (10 mg total) by mouth at bedtime as  needed for anxiety. 30 tablet 0  . methocarbamol (ROBAXIN) 500 MG tablet Take 1 tablet (500 mg total) by mouth every 8 (eight) hours as needed for muscle spasms. 30 tablet 1  . nortriptyline (PAMELOR) 50 MG capsule TAKE ONE TO TWO CAPSULES BY MOUTH NIGHTLY (Patient taking differently: Take 100 mg by mouth at bedtime. ) 180 capsule 0  . spironolactone (ALDACTONE) 50  MG tablet Take 1 tablet (50 mg total) by mouth daily. Office visit needed for refills (Patient taking differently: Take 50 mg by mouth daily. ) 90 tablet 1  . tiotropium (SPIRIVA) 18 MCG inhalation capsule Place 1 capsule (18 mcg total) into inhaler and inhale daily. (Patient not taking: Reported on 10/22/2019) 30 capsule 12  . venlafaxine (EFFEXOR) 75 MG tablet Take 1 tablet (75 mg total) by mouth daily. 90 tablet 0  . VENTOLIN HFA 108 (90 Base) MCG/ACT inhaler TAKE 2 PUFFS BY MOUTH EVERY 6 HOURS AS NEEDED FOR WHEEZE OR SHORTNESS OF BREATH (Patient taking differently: Inhale 2 puffs into the lungs every 6 (six) hours as needed for wheezing or shortness of breath. ) 18 g 5  . Vitamin D, Ergocalciferol, (DRISDOL) 1.25 MG (50000 UT) CAPS capsule Take 1 capsule (50,000 Units total) by mouth every 7 (seven) days. (Patient taking differently: Take 50,000 Units by mouth every Tuesday. ) 5 capsule 3  . triamcinolone cream (KENALOG) 0.1 % Apply 1 application topically 2 (two) times daily. 30 g 1    Allergies  Allergen Reactions  . Sulfa Drugs Cross Reactors Hives and Rash  . Ceclor [Cefaclor] Hives    Tolerated ceftazidime December 2016  . Depakote Er [Divalproex Sodium Er] Other (See Comments)    Patient remarked that when she was taking this TWICE a day, she became "clumsy" and felt more prone to stumbling  . Escitalopram Oxalate Other (See Comments)    Pt does not recall ever taking medication (??)  . Latex Itching  . Sertraline Hcl Other (See Comments)    Severe headaches   . Tape Itching and Rash    Band-Aids, also (PAPER TAPE IS  TOLERATED)    ROS Review of Systems   Review of Systems  Constitutional: Negative for activity change, appetite change, chills and fever.  HENT: Negative for congestion, nosebleeds, trouble swallowing and voice change.   Respiratory: Negative for cough, shortness of breath and wheezing.   Gastrointestinal: Negative for diarrhea, nausea and vomiting.  Genitourinary: Negative for difficulty urinating, dysuria, flank pain and hematuria.  Musculoskeletal: positive for knee pain and bilateral lower extremity pain  Neurological: Negative for dizziness, speech difficulty, light-headedness and numbness.  Skin:  Positive for warmth, erythema.  See HPI. All other review of systems negative.      Objective:    Physical Exam  Constitutional: She appears well-developed and well-nourished.  Cardiovascular: Normal rate, regular rhythm and normal heart sounds.  Pulmonary/Chest: Effort normal and breath sounds normal.  Musculoskeletal:        General: Tenderness and edema present.  Skin: Skin is warm and dry. There is erythema.  Warmth, erythema, and edema RLE from foot to patella. Erythema on bottom 1/3 of foot on the left.   Nursing note and vitals reviewed.      BP (!) 149/80   Pulse 86   Temp 97.6 F (36.4 C)   Resp 12   Ht 5\' 3"  (1.6 m)   Wt 117 lb (53.1 kg)   SpO2 95%   BMI 20.73 kg/m  Wt Readings from Last 3 Encounters:  10/24/19 114 lb 4.8 oz (51.8 kg)  10/18/19 117 lb (53.1 kg)  10/11/19 118 lb (53.5 kg)     Health Maintenance Due  Topic Date Due  . MAMMOGRAM  04/22/2019      Lab Results  Component Value Date   TSH 2.070 10/15/2018   Lab Results  Component Value Date   WBC 4.0 10/23/2019  HGB 12.0 10/23/2019   HCT 37.1 10/23/2019   MCV 92.8 10/23/2019   PLT 93 (L) 10/23/2019   Lab Results  Component Value Date   NA 135 10/23/2019   K 4.3 10/23/2019   CO2 23 10/23/2019   GLUCOSE 82 10/23/2019   BUN 8 10/23/2019   CREATININE 0.57 10/23/2019    BILITOT 0.7 10/21/2019   ALKPHOS 141 (H) 10/21/2019   AST 32 10/21/2019   ALT 23 10/21/2019   PROT 7.7 10/21/2019   ALBUMIN 3.5 10/21/2019   CALCIUM 8.7 (L) 10/23/2019   ANIONGAP 10 10/23/2019   GFR 186.01 02/04/2016   Lab Results  Component Value Date   CHOL 165 10/11/2019   Lab Results  Component Value Date   HDL 72 10/11/2019   Lab Results  Component Value Date   LDLCALC 77 10/11/2019   Lab Results  Component Value Date   TRIG 87 10/11/2019   Lab Results  Component Value Date   CHOLHDL 2.3 10/11/2019   Lab Results  Component Value Date   HGBA1C 5.2 07/20/2017      Assessment & Plan:   Problem List Items Addressed This Visit    Tobacco abuse (Chronic)   Fall as cause of accidental injury at home as place of occurrence (Chronic)   Traumatic hematoma of knee, right, initial encounter (Chronic)   Cellulitis of right lower extremity - Primary (Chronic)    Other Visit Diagnoses    Dry skin       Relevant Medications   triamcinolone cream (KENALOG) 0.1 %      Meds ordered this encounter  Medications  . doxycycline (VIBRA-TABS) 100 MG tablet    Sig: Take 1 tablet (100 mg total) by mouth 2 (two) times daily.    Dispense:  20 tablet    Refill:  0  . triamcinolone cream (KENALOG) 0.1 %    Sig: Apply 1 application topically 2 (two) times daily.    Dispense:  30 g    Refill:  1    Follow-up:  Patient is allergic to Bactrim.  I have strongly encouraged her to go to the ER but she refuses.  Requested to try oral treatment first.  We confirmed that Ortho called her and she needs to f/u with that appointment.  She verbalized understanding.   She is not ready to stop smoking at the moment.       Glyn Ade, NP

## 2019-10-25 NOTE — Care Management Important Message (Signed)
Important Message  Patient Details  Name: Catherine Kelley MRN: [id number] Date of Birth: [birth year]   Medicare Important Message Given:  Yes     Orbie Pyo 10/25/2019, 2:26 PM

## 2019-10-25 NOTE — Plan of Care (Signed)
  Problem: Education: Goal: Knowledge of General Education information will improve Description Including pain rating scale, medication(s)/side effects and non-pharmacologic comfort measures Outcome: Progressing   

## 2019-10-26 LAB — BASIC METABOLIC PANEL
Anion gap: 9 (ref 5–15)
BUN: 10 mg/dL (ref 8–23)
CO2: 26 mmol/L (ref 22–32)
Calcium: 8.9 mg/dL (ref 8.9–10.3)
Chloride: 99 mmol/L (ref 98–111)
Creatinine, Ser: 0.61 mg/dL (ref 0.44–1.00)
GFR calc Af Amer: >60 mL/min (ref >60)
GFR calc non Af Amer: >60 mL/min (ref >60)
Glucose, Bld: 97 mg/dL (ref 70–99)
Potassium: 4.2 mmol/L (ref 3.5–5.1)
Sodium: 134 mmol/L — ABNORMAL LOW (ref 135–145)

## 2019-10-26 LAB — CBC
HCT: 38.6 % (ref 36.0–46.0)
Hemoglobin: 12.6 g/dL (ref 12.0–15.0)
MCH: 30.2 pg (ref 26.0–34.0)
MCHC: 32.6 g/dL (ref 30.0–36.0)
MCV: 92.6 fL (ref 80.0–100.0)
Platelets: 98 10*3/uL — ABNORMAL LOW (ref 150–400)
RBC: 4.17 MIL/uL (ref 3.87–5.11)
RDW: 13.9 % (ref 11.5–15.5)
WBC: 5 10*3/uL (ref 4.0–10.5)
nRBC: 0 % (ref 0.0–0.2)

## 2019-10-26 MED ORDER — SPIRONOLACTONE 25 MG PO TABS
50.0000 mg | ORAL_TABLET | Freq: Every day | ORAL | Status: DC
  Administered 2019-10-26: 50 mg via ORAL
  Filled 2019-10-26: qty 2

## 2019-10-26 MED ORDER — AMLODIPINE BESYLATE 5 MG PO TABS
2.5000 mg | ORAL_TABLET | Freq: Every day | ORAL | Status: DC
  Administered 2019-10-26: 2.5 mg via ORAL
  Filled 2019-10-26: qty 1

## 2019-10-26 MED ORDER — CEPHALEXIN 500 MG PO CAPS: 500.0000 mg | ORAL_CAPSULE | Freq: Four times a day (QID) | ORAL | 0 refills | Status: AC

## 2019-10-26 NOTE — Discharge Instructions (Signed)

## 2019-10-26 NOTE — Plan of Care (Signed)
  Problem: Activity: Goal: Risk for activity intolerance will decrease Outcome: Progressing   

## 2019-10-26 NOTE — Progress Notes (Signed)
Catherine Kelley to be discharged home per MD order. Discussed prescriptions and follow up appointments with the patient. Prescriptions given to patient; medication list explained in detail. Patient verbalized understanding.  Skin clean, dry and intact without evidence of skin break down, no evidence of skin tears noted. IV catheter discontinued intact. Site without signs and symptoms of complications. Dressing and pressure applied. Pt denies pain at the site currently. No complaints noted.  Patient free of lines, drains, and wounds.   An After Visit Summary (AVS) was printed and given to the patient. Patient escorted via wheelchair, and discharged home via private auto.  Baldo Ash, RN

## 2019-10-26 NOTE — TOC Transition Note (Signed)
Transition of Care [facility]) - CM/SW Discharge Note   Patient Details  Name: Catherine Kelley MRN: [id number] Date of Birth: 07/18/1951  [facility] CM/SW Contact:  Marilu Favre, RN Phone Number: 10/26/2019, 11:31 AM   Clinical Narrative:      Butch Penny with [facility] aware discharge is today and has HHPT orders.   Barriers to Discharge: Continued Medical Work up   Patient Goals and CMS Choice   CMS Medicare.gov Compare Post Acute Care list provided to:: Patient Choice offered to / list presented to : Patient  Discharge Placement                       Discharge Plan and Services In-house Referral: NA Discharge Planning Services: CM Consult Post Acute Care Choice: Home Health          DME Arranged: N/A DME Agency: NA       HH Arranged: PT [place] Agency: [place] ([place]) Date HH Agency Contacted: 10/24/19 Time HH Agency Contacted: 1300 Representative spoke with at [place]: [place] ([place]) Interventions     Readmission Risk Interventions No flowsheet data found.

## 2019-10-26 NOTE — Plan of Care (Signed)
  Problem: Education: Goal: Knowledge of General Education information will improve Description: Including pain rating scale, medication(s)/side effects and non-pharmacologic comfort measures Outcome: Adequate for Discharge   Problem: Health Behavior/Discharge Planning: Goal: Ability to manage health-related needs will improve Outcome: Adequate for Discharge   Problem: Clinical Measurements: Goal: Ability to maintain clinical measurements within normal limits will improve Outcome: Adequate for Discharge Goal: Will remain free from infection Outcome: Adequate for Discharge Goal: Diagnostic test results will improve Outcome: Adequate for Discharge Goal: Respiratory complications will improve Outcome: Adequate for Discharge Goal: Cardiovascular complication will be avoided Outcome: Adequate for Discharge   Problem: Activity: Goal: Risk for activity intolerance will decrease 10/26/2019 1722 by Baldo Ash, RN Outcome: Adequate for Discharge 10/26/2019 0840 by Baldo Ash, RN Outcome: Progressing   Problem: Elimination: Goal: Will not experience complications related to bowel motility Outcome: Adequate for Discharge Goal: Will not experience complications related to urinary retention Outcome: Adequate for Discharge   Problem: Pain Managment: Goal: General experience of comfort will improve Outcome: Adequate for Discharge   Problem: Safety: Goal: Ability to remain free from injury will improve Outcome: Adequate for Discharge   Problem: Skin Integrity: Goal: Risk for impaired skin integrity will decrease Outcome: Adequate for Discharge   Problem: Clinical Measurements: Goal: Ability to avoid or minimize complications of infection will improve Outcome: Adequate for Discharge   Problem: Skin Integrity: Goal: Skin integrity will improve Outcome: Adequate for Discharge   Problem: Acute Rehab PT Goals(only PT should resolve) Goal: Patient Will Transfer Sit To/From  Stand Outcome: Adequate for Discharge Goal: Pt Will Transfer Bed To Chair/Chair To Bed Outcome: Adequate for Discharge Goal: Pt Will Ambulate Outcome: Adequate for Discharge Goal: Pt Will Go Up/Down Stairs Outcome: Adequate for Discharge   Problem: Inadequate Intake (NI-2.1) Goal: Food and/or nutrient delivery Description: Individualized approach for food/nutrient provision. Outcome: Adequate for Discharge

## 2019-10-26 NOTE — Discharge Summary (Signed)
Physician Discharge Summary  Catherine Kelley:[id number] DOB: [birth year] DOA: 10/21/2019  PCP: Rutherford Guys, MD  Admit date: 10/21/2019 Discharge date: 10/26/2019  Admitted From: Home Disposition:  Home with home health   Recommendations for Outpatient Follow-up:  1. Follow up with PCP in 1 week  Discharge Condition: Stable CODE STATUS: Full  Diet recommendation: Heart healthy   Brief/Interim Summary: From H&P: Catherine Kelley is an 68 y.o. female withhistory of COPD with ongoing tobacco abuse, hypertension, bipolar disorder, history of carotid stent presents to the ER with worsening swelling of the right lower extremity. Patient had a fall after tripping on a walker about 10 days ago and had injured her right knee and had a small laceration and a small hematoma at that time. Followed up with her primary care physician. Initially had x-rays and was treated conservatively. Few days later patient started developing erythema and swelling for which patient was placed on doxycycline which patient has been taking it for last 5 days. Despite which patient swelling has worsened. Has become more erythematous and swelling and erythema has started moving distally involving the whole right leg from the knee downwards.   She was treated for RLE cellulitis with IV antibiotics. The swelling and erythema are slowly improving. She felt ready to go home and manage with PO antibiotics. All questions and concerns addressed prior to discharge home.   Discharge Diagnoses:  Principal Problem:   Cellulitis of right lower extremity Active Problems:   Stage 2 moderate COPD by GOLD classification (HCC)   Essential hypertension   Bipolar I disorder (HCC)   Cellulitis of the right lower extremity -Failed outpatient abx of doxycycline  -Occurred after mechanical fall. Patient's x-rays does show possibility of a small hematoma on the right knee area. There is soft tissue edema in the x-ray seen in the leg.   -Duplex US negative for DVT -Afebrile, no leukocytosis  -Blood cultures negative to date  -Keflex on discharge (allergy to sulfa, tolerated cephalosporin in 2016)  COPD - stable -Continue inhalers  Chronic thrombocytopenia  -Stable. Monitor   History of carotid stent placement  -Continue aspirin  Bipolar disorder  -Continue Depakote and Effexor  Hypertension  -Continue Norvasc, spironolactone   Tobacco abuse -Tobacco cessation counseling provided   Discharge Instructions  Discharge Instructions    Call MD for:  difficulty breathing, headache or visual disturbances   Complete by: As directed    Call MD for:  extreme fatigue   Complete by: As directed    Call MD for:  persistant dizziness or light-headedness   Complete by: As directed    Call MD for:  persistant nausea and vomiting   Complete by: As directed    Call MD for:  severe uncontrolled pain   Complete by: As directed    Call MD for:  temperature >100.4   Complete by: As directed    Diet - low sodium heart healthy   Complete by: As directed    Discharge instructions   Complete by: As directed    You were cared for by a hospitalist during your hospital stay. If you have any questions about your discharge medications or the care you received while you were in the hospital after you are discharged, you can call the unit and ask to speak with the hospitalist on call if the hospitalist that took care of you is not available. Once you are discharged, your primary care physician will handle any further medical issues. Please note  that NO REFILLS for any discharge medications will be authorized once you are discharged, as it is imperative that you return to your primary care physician (or establish a relationship with a primary care physician if you do not have one) for your aftercare needs so that they can reassess your need for medications and monitor your lab values.   Increase activity slowly   Complete by: As  directed      Allergies as of 10/26/2019      Reactions   Sulfa Drugs Cross Reactors Hives, Rash   Ceclor [cefaclor] Hives   Tolerated ceftazidime December 2016   Depakote Er [divalproex Sodium Er] Other (See Comments)   Patient remarked that when she was taking this TWICE a day, she became "clumsy" and felt more prone to stumbling   Escitalopram Oxalate Other (See Comments)   Pt does not recall ever taking medication (??)   Latex Itching   Sertraline Hcl Other (See Comments)   Severe headaches   Tape Itching, Rash   Band-Aids, also (PAPER TAPE IS TOLERATED)      Medication List    TAKE these medications   amLODipine 2.5 MG tablet Commonly known as: NORVASC Take 1 tablet (2.5 mg total) by mouth daily.   aspirin EC 81 MG tablet Take 81 mg by mouth daily as needed.   budesonide-formoterol 160-4.5 MCG/ACT inhaler Commonly known as: SYMBICORT Inhale 2 puffs into the lungs 2 (two) times daily.   calcium carbonate 750 MG chewable tablet Commonly known as: TUMS EX Chew 1 tablet by mouth 2 (two) times daily as needed for heartburn.   cephALEXin 500 MG capsule Commonly known as: KEFLEX Take 1 capsule (500 mg total) by mouth 4 (four) times daily for 7 days.   divalproex 250 MG 24 hr tablet Commonly known as: DEPAKOTE ER Take 3 tablets (750 mg total) by mouth at bedtime.   fluticasone 50 MCG/ACT nasal spray Commonly known as: FLONASE Place 2 sprays into both nostrils daily.   HYDROcodone-acetaminophen 5-325 MG tablet Commonly known as: NORCO/VICODIN Take 1-2 tablets by mouth every 6 (six) hours as needed for up to 30 doses for moderate pain.   hydrOXYzine 10 MG tablet Commonly known as: ATARAX/VISTARIL Take 1 tablet (10 mg total) by mouth at bedtime as needed for anxiety.   methocarbamol 500 MG tablet Commonly known as: ROBAXIN Take 1 tablet (500 mg total) by mouth every 8 (eight) hours as needed for muscle spasms.   nortriptyline 50 MG capsule Commonly known as:  PAMELOR TAKE ONE TO TWO CAPSULES BY MOUTH NIGHTLY What changed:   how much to take  how to take this  when to take this  additional instructions   PRESCRIPTION MEDICATION High-Frequency Chest Wall Oscillation (the Vest):   spironolactone 50 MG tablet Commonly known as: ALDACTONE Take 1 tablet (50 mg total) by mouth daily. Office visit needed for refills What changed: additional instructions   triamcinolone cream 0.1 % Commonly known as: KENALOG Apply 1 application topically 2 (two) times daily.   venlafaxine 75 MG tablet Commonly known as: EFFEXOR Take 1 tablet (75 mg total) by mouth daily.   albuterol (2.5 MG/3ML) 0.083% nebulizer solution Commonly known as: PROVENTIL Take 2.5 mg by nebulization every 6 (six) hours as needed for wheezing or shortness of breath. What changed: Another medication with the same name was changed. Make sure you understand how and when to take each.   Ventolin HFA 108 (90 Base) MCG/ACT inhaler Generic drug: albuterol TAKE 2 PUFFS  BY MOUTH EVERY 6 HOURS AS NEEDED FOR WHEEZE OR SHORTNESS OF BREATH What changed: See the new instructions.   Vitamin D (Ergocalciferol) 1.25 MG (50000 UT) Caps capsule Commonly known as: DRISDOL Take 1 capsule (50,000 Units total) by mouth every 7 (seven) days. What changed: when to take this      [place], [facility] Follow up.   Why: The office will call to schedule visits for physical therapy Contact information: [place] [address] [phone]        Rutherford Guys, MD. Schedule an appointment as soon as possible for a visit in 1 week(s).   Specialty: Family Medicine Contact information: [address] [name] [address] [phone]          Allergies  Allergen Reactions  . Sulfa Drugs Cross Reactors Hives and Rash  . Ceclor [Cefaclor] Hives    Tolerated ceftazidime December 2016  . Depakote Er [Divalproex Sodium Er]  Other (See Comments)    Patient remarked that when she was taking this TWICE a day, she became "clumsy" and felt more prone to stumbling  . Escitalopram Oxalate Other (See Comments)    Pt does not recall ever taking medication (??)  . Latex Itching  . Sertraline Hcl Other (See Comments)    Severe headaches   . Tape Itching and Rash    Band-Aids, also (PAPER TAPE IS TOLERATED)      Procedures/Studies: Dg Chest 2 View  Result Date: 10/11/2019 CLINICAL DATA:  COPD EXAM: CHEST - 2 VIEW COMPARISON:  12/22/2018 FINDINGS: The heart size and mediastinal contours are within normal limits. Stable irregular scarring and bronchiectasis of the right lower lobes. The visualized skeletal structures are unremarkable. IMPRESSION: Stable irregular scarring and bronchiectasis of the right lower lobes. No acute appearing airspace opacity. Electronically Signed   By: Eddie Candle M.D.   On: 10/11/2019 12:09   Dg Knee 1-2 Views Right  Result Date: 10/11/2019 CLINICAL DATA:  Fall with hematoma to patella. EXAM: RIGHT KNEE - 1-2 VIEW COMPARISON:  None FINDINGS: No evidence of fracture, dislocation, or joint effusion. No evidence of arthropathy or other focal bone abnormality. Prepatellar soft tissue swelling identified. IMPRESSION: 1. No acute bone abnormality. 2. Prepatellar soft tissue swelling. Electronically Signed   By: Kerby Moors M.D.   On: 10/11/2019 12:27   Dg Tibia/fibula Right  Result Date: 10/21/2019 CLINICAL DATA:  Pain, swelling, erythema. Recent injury. Patient reports infection. Worsening pain and swelling. EXAM: RIGHT TIBIA AND FIBULA - 2 VIEW COMPARISON:  No prior tibia/fibular imaging. FINDINGS: Cortical margins of the tibia and fibular intact. No osseous erosions or periosteal reaction. There is no evidence of fracture or other focal bone lesions. Diffuse soft tissue edema of the lower leg. No soft tissue air or radiopaque foreign body. IMPRESSION: 1. No acute osseous abnormality of the  right lower leg. 2. Diffuse subcutaneous soft tissue edema.  No soft tissue air. Electronically Signed   By: Keith Rake M.D.   On: 10/21/2019 23:28   Dg Knee Complete 4 Views Right  Result Date: 10/21/2019 CLINICAL DATA:  Pain, swelling, erythema. Recent injury. Patient reports infection. Worsening pain and swelling. EXAM: RIGHT KNEE - COMPLETE 4+ VIEW COMPARISON:  Knee radiograph 10/11/2019 FINDINGS: No evidence of fracture, dislocation, or joint effusion. No evidence of arthropathy or other focal bone abnormality. Soft tissue thickening anterior to the patellar tendon. Generalized soft tissue edema. IMPRESSION: 1. Similar prepatellar soft tissue  thickening to prior exam, suggesting hematoma in the setting of injury. 2. Diffuse increase subcutaneous soft tissue edema. 3. No acute osseous abnormality. Electronically Signed   By: Keith Rake M.D.   On: 10/21/2019 23:30   Dg Foot Complete Right  Result Date: 10/21/2019 CLINICAL DATA:  Pain, swelling, erythema. Recent injury. Patient reports infection. Worsening pain and swelling. EXAM: RIGHT FOOT COMPLETE - 3+ VIEW COMPARISON:  None. FINDINGS: There is no evidence of fracture or dislocation. Prominent plantar calcaneal spur. There is no evidence of arthropathy or other focal bone abnormality. Mild generalized soft tissue edema. No soft tissue air or radiopaque foreign body. IMPRESSION: 1. No acute osseous abnormality. 2. Generalized soft tissue edema. 3. Plantar calcaneal spur. Electronically Signed   By: Keith Rake M.D.   On: 10/21/2019 23:29   Vas [name] Lower Extremity Venous (dvt) (only [facility])  Result Date: 10/23/2019  Lower Venous Study Indications: Pain, Erythema, and cellulitis.  Comparison Study: no prior Performing Technologist: June Leap RDMS, RVT  Examination Guidelines: A complete evaluation includes B-mode imaging, spectral Doppler, color Doppler, and power Doppler as needed of all accessible portions of each vessel.  Bilateral testing is considered an integral part of a complete examination. Limited examinations for reoccurring indications may be performed as noted.  +---------+---------------+---------+-----------+----------+--------------+ RIGHT    CompressibilityPhasicitySpontaneityPropertiesThrombus Aging +---------+---------------+---------+-----------+----------+--------------+ CFV      Full           Yes      Yes                                 +---------+---------------+---------+-----------+----------+--------------+ SFJ      Full                                                        +---------+---------------+---------+-----------+----------+--------------+ FV Prox  Full                                                        +---------+---------------+---------+-----------+----------+--------------+ FV Mid   Full                                                        +---------+---------------+---------+-----------+----------+--------------+ FV DistalFull                                                        +---------+---------------+---------+-----------+----------+--------------+ PFV      Full                                                        +---------+---------------+---------+-----------+----------+--------------+ POP      Full  Yes      Yes                                 +---------+---------------+---------+-----------+----------+--------------+ PTV      Full                                                        +---------+---------------+---------+-----------+----------+--------------+ PERO     Full                                                        +---------+---------------+---------+-----------+----------+--------------+     Summary: Right: There is no evidence of deep vein thrombosis in the lower extremity. No cystic structure found in the popliteal fossa.  *See table(s) above for measurements and observations.  Electronically signed by Servando Snare MD on 10/23/2019 at 10:59:07 AM.    Final        Discharge Exam: Vitals:   10/26/19 9417 10/26/19 0929  BP:  (!) 154/66  Pulse:  94  Resp:    Temp:  97.8 F (36.6 C)  SpO2: 91% 92%     General: Pt is alert, awake, not in acute distress Cardiovascular: RRR, S1/S2 +, no edema Respiratory: CTA bilaterally, no wheezing, no rhonchi, no respiratory distress, no conversational dyspnea  Abdominal: Soft, NT, ND, bowel sounds + Extremities: +mild right lower extremity edema, nonpitting, erythema and warmth compared to left, but improving her report  Psych: Normal mood and affect, stable judgement and insight     The results of significant diagnostics from this hospitalization (including imaging, microbiology, ancillary and laboratory) are listed below for reference.     Microbiology: Recent Results (from the past 240 hour(s))  Blood culture (routine x 2)     Status: None (Preliminary result)   Collection Time: 10/21/19  7:03 PM   Specimen: BLOOD  Result Value Ref Range Status   Specimen Description BLOOD RIGHT ANTECUBITAL  Final   Special Requests   Final    BOTTLES DRAWN AEROBIC AND ANAEROBIC Blood Culture adequate volume   Culture   Final    NO GROWTH 3 DAYS Performed at [facility], [address] [place], [place] [address]    Report Status PENDING  Incomplete  Blood culture (routine x 2)     Status: None (Preliminary result)   Collection Time: 10/21/19 11:15 PM   Specimen: BLOOD RIGHT FOREARM  Result Value Ref Range Status   Specimen Description BLOOD RIGHT FOREARM  Final   Special Requests   Final    BOTTLES DRAWN AEROBIC AND ANAEROBIC Blood Culture adequate volume   Culture   Final    NO GROWTH 3 DAYS Performed at [facility], [place] [address], [place], [place] [address]    Report Status PENDING  Incomplete  SARS CORONAVIRUS 2 (TAT 6-24 HRS) Nasopharyngeal Nasopharyngeal Swab     Status: None   Collection Time:  10/21/19 11:55 PM   Specimen: Nasopharyngeal Swab  Result Value Ref Range Status   SARS Coronavirus 2 NEGATIVE NEGATIVE Final    Comment: (NOTE) SARS-CoV-2 target nucleic acids are NOT  DETECTED. The SARS-CoV-2 RNA is generally detectable in upper and lower respiratory specimens during the acute phase of infection. Negative results do not preclude SARS-CoV-2 infection, do not rule out co-infections with other pathogens, and should not be used as the sole basis for treatment or other patient management decisions. Negative results must be combined with clinical observations, patient history, and epidemiological information. The expected result is Negative. Fact Sheet for Patients: SugarRoll.be Fact Sheet for Healthcare Providers: https://www.woods-mathews.com/ This test is not yet approved or cleared by the [place] FDA and  has been authorized for detection and/or diagnosis of SARS-CoV-2 by FDA under an Emergency Use Authorization (EUA). This EUA will remain  in effect (meaning this test can be used) for the duration of the COVID-19 declaration under Section 56 4(b)(1) of the Act, 21 U.S.C. section 360bbb-3(b)(1), unless the authorization is terminated or revoked sooner. Performed at [facility], [place] [address], [place], [place] [address]      Labs: BNP (last 3 results) No results for input(s): BNP in the last 8760 hours. Basic Metabolic Panel: Recent Labs  Lab 10/21/19 1905 10/22/19 0330 10/23/19 0457 10/26/19 0519  NA 135 136 135 134*  K 3.7 3.4* 4.3 4.2  CL 100 102 102 99  CO2 24 26 23 26   GLUCOSE 89 91 82 97  BUN 11 11 8 10   CREATININE 0.94 0.77 0.57 0.61  CALCIUM 9.0 8.7* 8.7* 8.9   Liver Function Tests: Recent Labs  Lab 10/21/19 1905  AST 32  ALT 23  ALKPHOS 141*  BILITOT 0.7  PROT 7.7  ALBUMIN 3.5   No results for input(s): LIPASE, AMYLASE in the last 168 hours. No results for input(s): AMMONIA in  the last 168 hours. CBC: Recent Labs  Lab 10/21/19 1905 10/22/19 0330 10/23/19 0457 10/26/19 0519  WBC 4.8 3.8* 4.0 5.0  NEUTROABS 2.9  --   --   --   HGB 13.7 11.5* 12.0 12.6  HCT 42.2 35.2* 37.1 38.6  MCV 92.3 92.1 92.8 92.6  PLT PLATELET CLUMPS NOTED ON SMEAR, COUNT APPEARS DECREASED 101* 93* 98*   Cardiac Enzymes: No results for input(s): CKTOTAL, CKMB, CKMBINDEX, TROPONINI in the last 168 hours. BNP: Invalid input(s): POCBNP CBG: No results for input(s): GLUCAP in the last 168 hours. D-Dimer No results for input(s): DDIMER in the last 72 hours. Hgb A1c No results for input(s): HGBA1C in the last 72 hours. Lipid Profile No results for input(s): CHOL, HDL, LDLCALC, TRIG, CHOLHDL, LDLDIRECT in the last 72 hours. Thyroid function studies No results for input(s): TSH, T4TOTAL, T3FREE, THYROIDAB in the last 72 hours.  Invalid input(s): FREET3 Anemia work up No results for input(s): VITAMINB12, FOLATE, FERRITIN, TIBC, IRON, RETICCTPCT in the last 72 hours. Urinalysis    Component Value Date/Time   COLORURINE YELLOW 02/13/2016 0237   APPEARANCEUR Clear 07/29/2019 1606   LABSPEC 1.007 02/13/2016 0237   PHURINE 7.0 02/13/2016 0237   GLUCOSEU Negative 07/29/2019 1606   HGBUR NEGATIVE 02/13/2016 0237   BILIRUBINUR negative 09/16/2019 1047   BILIRUBINUR Negative 07/29/2019 1606   KETONESUR trace (5) (A) 09/16/2019 1047   KETONESUR NEGATIVE 02/13/2016 0237   PROTEINUR =100 (A) 09/16/2019 1047   PROTEINUR Negative 07/29/2019 1606   PROTEINUR NEGATIVE 02/13/2016 0237   UROBILINOGEN 1.0 09/16/2019 1047   UROBILINOGEN 0.2 08/16/2015 1310   NITRITE Positive (A) 09/16/2019 1047   NITRITE Negative 07/29/2019 1606   NITRITE NEGATIVE 02/13/2016 0237   LEUKOCYTESUR Large (3+) (A) 09/16/2019 1047  LEUKOCYTESUR Negative 07/29/2019 1606   Sepsis Labs Invalid input(s): PROCALCITONIN,  WBC,  LACTICIDVEN Microbiology Recent Results (from the past 240 hour(s))  Blood culture  (routine x 2)     Status: None (Preliminary result)   Collection Time: 10/21/19  7:03 PM   Specimen: BLOOD  Result Value Ref Range Status   Specimen Description BLOOD RIGHT ANTECUBITAL  Final   Special Requests   Final    BOTTLES DRAWN AEROBIC AND ANAEROBIC Blood Culture adequate volume   Culture   Final    NO GROWTH 3 DAYS Performed at [facility], [address] [place], [place] [address]    Report Status PENDING  Incomplete  Blood culture (routine x 2)     Status: None (Preliminary result)   Collection Time: 10/21/19 11:15 PM   Specimen: BLOOD RIGHT FOREARM  Result Value Ref Range Status   Specimen Description BLOOD RIGHT FOREARM  Final   Special Requests   Final    BOTTLES DRAWN AEROBIC AND ANAEROBIC Blood Culture adequate volume   Culture   Final    NO GROWTH 3 DAYS Performed at [facility], [place] [address], [place], [place] [address]    Report Status PENDING  Incomplete  SARS CORONAVIRUS 2 (TAT 6-24 HRS) Nasopharyngeal Nasopharyngeal Swab     Status: None   Collection Time: 10/21/19 11:55 PM   Specimen: Nasopharyngeal Swab  Result Value Ref Range Status   SARS Coronavirus 2 NEGATIVE NEGATIVE Final    Comment: (NOTE) SARS-CoV-2 target nucleic acids are NOT DETECTED. The SARS-CoV-2 RNA is generally detectable in upper and lower respiratory specimens during the acute phase of infection. Negative results do not preclude SARS-CoV-2 infection, do not rule out co-infections with other pathogens, and should not be used as the sole basis for treatment or other patient management decisions. Negative results must be combined with clinical observations, patient history, and epidemiological information. The expected result is Negative. Fact Sheet for Patients: SugarRoll.be Fact Sheet for Healthcare Providers: https://www.woods-mathews.com/ This test is not yet approved or cleared by the [place] FDA and  has been  authorized for detection and/or diagnosis of SARS-CoV-2 by FDA under an Emergency Use Authorization (EUA). This EUA will remain  in effect (meaning this test can be used) for the duration of the COVID-19 declaration under Section 56 4(b)(1) of the Act, 21 U.S.C. section 360bbb-3(b)(1), unless the authorization is terminated or revoked sooner. Performed at [facility], [place] [address], [place], [place] [address]      Patient was seen and examined on the day of discharge and was found to be in stable condition. Time coordinating discharge: 40 minutes including assessment and coordination of care, as well as examination of the patient.   SIGNED:  Dessa Phi, DO [facility] 10/26/2019, 9:54 AM

## 2019-10-26 NOTE — Progress Notes (Signed)
Physical Therapy Treatment Patient Details Name: Catherine Kelley MRN: [id number] DOB: 1951-11-23 Today's Date: 10/26/2019    History of Present Illness 68 yo female with increased edema and erythema R LE after tripping and falling on her RW 10 days ago. Negative for acute fracture or DVT. Admitted for R LE cellulitis. PMH anxiety, bipolar, LBP, COPD, HTN, necrotic pneumonia    PT Comments    Pt was seen for mobility on stairwell and with there ex afterward.  Pt is very much fearful of a fall, but is capable of stairs with very minimal help. Follow up to instill more confidence with more practice, and encourage her staff to have her OOB in chair and walking short trips as well.  See pt regularly as time and pt allow.   Follow Up Recommendations  Home health PT;Supervision - Intermittent     Equipment Recommendations  Rolling walker with 5" wheels    Recommendations for Other Services       Precautions / Restrictions Precautions Precautions: Fall Precaution Comments: painful R LE, impulsive Restrictions Weight Bearing Restrictions: No    Mobility  Bed Mobility Overal bed mobility: Modified Independent             General bed mobility comments: uses bed rail to steady  Transfers Overall transfer level: Needs assistance Equipment used: Rolling walker (2 wheeled) Transfers: Sit to/from Stand Sit to Stand: Supervision         General transfer comment: reminders for safety as pt is quick totry to get into recliner from bed  Ambulation/Gait Ambulation/Gait assistance: Min guard Gait Distance (Feet): 5 Feet Assistive device: 1 person hand held assist Gait Pattern/deviations: Step-through pattern;Wide base of support Gait velocity: decreased Gait velocity interpretation: <1.31 ft/sec, indicative of household ambulator General Gait Details: pt is unsteady but motivated to get to chair, then tired   Stairs Stairs: Yes Stairs assistance: Min assist Stair Management:  One rail Right;One rail Left;Step to pattern;Forwards;Backwards Number of Stairs: 2 General stair comments: pt is fearful and initially asks to not do it, but PT convinced her to try a short set of stairs.  Pt is describing 14-18 with her daughter's house   Wheelchair Mobility    Modified Rankin (Stroke Patients Only)       Balance Overall balance assessment: Needs assistance Sitting-balance support: Feet supported Sitting balance-Leahy Scale: Fair     Standing balance support: Bilateral upper extremity supported;During functional activity Standing balance-Leahy Scale: Poor                              Cognition Arousal/Alertness: Awake/alert Behavior During Therapy: Impulsive;Anxious Overall Cognitive Status: No family/caregiver present to determine baseline cognitive functioning                                 General Comments: pt is unclear as to whether she can get around doing stairs at home      Exercises General Exercises - Lower Extremity Ankle Circles/Pumps: AROM;5 reps Quad Sets: AROM;10 reps Gluteal Sets: AROM;10 reps Long Arc Quad: AROM;10 reps Heel Slides: AROM;10 reps Hip ABduction/ADduction: AROM;10 reps Hip Flexion/Marching: AROM;10 reps    General Comments General comments (skin integrity, edema, etc.): Pt was afraid to try stairs, but could be convinced to give the effort.  Folllow up with her to continue to instruct this, as pt is wanting to go home from  hosp       Pertinent Vitals/Pain Pain Assessment: Faces Faces Pain Scale: Hurts little more Pain Location: R LE  Pain Descriptors / Indicators: Aching;Discomfort Pain Intervention(s): Limited activity within patient's tolerance;Monitored during session;Premedicated before session;Repositioned    Home Living                      Prior Function            PT Goals (current goals can now be found in the care plan section) Acute Rehab PT Goals Patient Stated  Goal: to avoid the stairs Progress towards PT goals: Progressing toward goals    Frequency    Min 3X/week      PT Plan Current plan remains appropriate    Co-evaluation              AM-PAC PT "6 Clicks" Mobility   Outcome Measure  Help needed turning from your back to your side while in a flat bed without using bedrails?: None Help needed moving from lying on your back to sitting on the side of a flat bed without using bedrails?: None Help needed moving to and from a bed to a chair (including a wheelchair)?: A Little Help needed standing up from a chair using your arms (e.g., wheelchair or bedside chair)?: A Little Help needed to walk in hospital room?: A Little Help needed climbing 3-5 steps with a railing? : A Little 6 Click Score: 20    End of Session         PT Visit Diagnosis: Unsteadiness on feet (R26.81);History of falling (Z91.81);Muscle weakness (generalized) (M62.81);Difficulty in walking, not elsewhere classified (R26.2);Pain Pain - Right/Left: Right Pain - part of body: Leg     Time: 9528-4132 PT Time Calculation (min) (ACUTE ONLY): 33 min  Charges:  $Gait Training: 8-22 mins $Therapeutic Exercise: 8-22 mins                    Ramond Dial 10/26/2019, 3:58 PM   Mee Hives, PT MS Acute Rehab Dept. Number: [place] [facility] [place]

## 2019-10-27 LAB — CULTURE, BLOOD (ROUTINE X 2)
Culture: NO GROWTH
Culture: NO GROWTH
Special Requests: ADEQUATE
Special Requests: ADEQUATE

## 2019-10-28 NOTE — Telephone Encounter (Signed)
Is it ok to refill?

## 2019-10-31 MED ORDER — SPIRONOLACTONE 50 MG PO TABS: 50.0000 mg | ORAL_TABLET | Freq: Every day | ORAL | 1 refills | Status: DC

## 2019-11-03 ENCOUNTER — Telehealth: Payer: Self-pay

## 2019-11-03 NOTE — Telephone Encounter (Signed)
Received fax from [facility], pt has declined the use of PT. Given form to pcp for sig.

## 2019-11-04 ENCOUNTER — Other Ambulatory Visit: Payer: Self-pay

## 2019-11-04 ENCOUNTER — Ambulatory Visit (INDEPENDENT_AMBULATORY_CARE_PROVIDER_SITE_OTHER): Payer: Medicare Other | Admitting: Psychiatry

## 2019-11-04 ENCOUNTER — Encounter (HOSPITAL_COMMUNITY): Payer: Self-pay | Admitting: Psychiatry

## 2019-11-04 DIAGNOSIS — F319 Bipolar disorder, unspecified: Secondary | ICD-10-CM | POA: Diagnosis not present

## 2019-11-04 DIAGNOSIS — F411 Generalized anxiety disorder: Secondary | ICD-10-CM

## 2019-11-04 DIAGNOSIS — I6523 Occlusion and stenosis of bilateral carotid arteries: Secondary | ICD-10-CM | POA: Diagnosis not present

## 2019-11-04 MED ORDER — NORTRIPTYLINE HCL 50 MG PO CAPS: 50.0000 mg | ORAL_CAPSULE | Freq: Every day | ORAL | 1 refills | Status: DC

## 2019-11-04 MED ORDER — VENLAFAXINE HCL 75 MG PO TABS: 75.0000 mg | ORAL_TABLET | Freq: Every day | ORAL | 0 refills | Status: DC

## 2019-11-04 MED ORDER — DIVALPROEX SODIUM ER 500 MG PO TB24: 1000.0000 mg | ORAL_TABLET | Freq: Every day | ORAL | 1 refills | Status: DC

## 2019-11-04 NOTE — Progress Notes (Signed)
Virtual Visit via Telephone Note  I connected with Catherine Kelley on 11/04/19 at 10:40 AM EST by telephone and verified that I am speaking with the correct person using two identifiers.   I discussed the limitations, risks, security and privacy concerns of performing an evaluation and management service by telephone and the availability of in person appointments. I also discussed with the patient that there may be a patient responsible charge related to this service. The patient expressed understanding and agreed to proceed.   History of Present Illness: Patient was evaluated by phone session.  She admitted irritability and sometimes mania.  She does not sleep all night.  I also spoke to her husband Delfino Lovett who endorsed that she goes depressed and then gets very impulsive and irritable.  In the past she has taken Depakote 1500 mg but could not tolerate and we reduce the dose to 750.  She is also taking amitriptyline 100 mg which she believes is helping her sleep but now realized her sleep is still on and off.  Patient denies any paranoia, hallucination, violence or any suicidal thoughts.  She admitted some time low energy but gets irritable.  She has no tremors, shakes or any EPS.  Recently she was admitted because of cellulitis and now she is feeling better.  Her last platelet which was done last month was 101.  Her metabolic panel is normal.  Her creatinine is 0.77.  She is compliant with venlafaxine.  Past Psychiatric History:Reviewed. H/Odepression,anxiety and bipolar disorder for long time. Had tried Paxil, Prozac and Zoloft that cause headaches. Abilify caused stiffnes. Took Xanax for many years however it was not renewed by primary care physician when [name] office did not accept her insurance. Did not recall any history of suicidal attempt or psychiatric inpatient treatment.  Recent Results (from the past 2160 hour(s))  POCT urinalysis dipstick     Status: Abnormal   Collection Time:  09/16/19 10:47 AM  Result Value Ref Range   Color, UA orange (A) yellow   Clarity, UA cloudy (A) clear   Glucose, UA =100 (A) negative mg/dL   Bilirubin, UA negative negative   Ketones, POC UA trace (5) (A) negative mg/dL   Spec Grav, UA 1.010 1.010 - 1.025   Blood, UA large (A) negative   pH, UA 5.5 5.0 - 8.0   Protein Ur, POC =100 (A) negative mg/dL   Urobilinogen, UA 1.0 0.2 or 1.0 E.U./dL   Nitrite, UA Positive (A) Negative   Leukocytes, UA Large (3+) (A) Negative  POCT Microscopic Urinalysis ([facility])     Status: Abnormal   Collection Time: 09/16/19 11:09 AM  Result Value Ref Range   WBC,UR,HPF,POC Too numerous to count  (A) None WBC/hpf   RBC,UR,HPF,POC Many (A) None RBC/hpf   Bacteria None None, Too numerous to count   Mucus Absent Absent   Epithelial Cells, UR Per Microscopy Few (A) None, Too numerous to count cells/hpf  Urine Culture     Status: None   Collection Time: 09/16/19 11:43 AM   Specimen: Urine   UR  Result Value Ref Range   Urine Culture, Routine Final report    Organism ID, Bacteria Comment     Comment: Culture shows less than 10,000 colony forming units of bacteria per milliliter of urine. This colony count is not generally considered to be clinically significant.   Vitamin D 25 (osteoporosis screening)     Status: Abnormal   Collection Time: 10/11/19  3:50 PM  Result  Value Ref Range   Vit D, 25-Hydroxy 9.3 (L) 30.0 - 100.0 ng/mL    Comment: Vitamin D deficiency has been defined by the [place] practice guideline as a level of serum 25-OH vitamin D less than 20 ng/mL (1,2). The Endocrine Society went on to further define vitamin D insufficiency as a level between 21 and 29 ng/mL (2). 1. IOM (Institute of Medicine). 2010. Dietary reference    intakes for calcium and D. [place]: The    [employer]. 2. Holick MF, Binkley [place], Bischoff-Ferrari HA, et al.    Evaluation, treatment, and prevention of  vitamin D    deficiency: an Endocrine Society clinical practice    guideline. JCEM. 2011 Jul; 96(7):1911-30.   CMP14+EGFR     Status: Abnormal   Collection Time: 10/11/19  3:50 PM  Result Value Ref Range   Glucose 71 65 - 99 mg/dL   BUN 10 8 - 27 mg/dL   Creatinine, Ser 0.69 0.57 - 1.00 mg/dL   GFR calc non Af Amer 89 >59 mL/min/1.73   GFR calc Af Amer 103 >59 mL/min/1.73   BUN/Creatinine Ratio 14 12 - 28   Sodium 137 134 - 144 mmol/L   Potassium 4.3 3.5 - 5.2 mmol/L   Chloride 98 96 - 106 mmol/L   CO2 21 20 - 29 mmol/L   Calcium 9.4 8.7 - 10.3 mg/dL   Total Protein 7.5 6.0 - 8.5 g/dL   Albumin 4.4 3.8 - 4.8 g/dL   Globulin, Total 3.1 1.5 - 4.5 g/dL   Albumin/Globulin Ratio 1.4 1.2 - 2.2   Bilirubin Total 0.4 0.0 - 1.2 mg/dL   Alkaline Phosphatase 154 (H) 39 - 117 IU/L   AST 30 0 - 40 IU/L   ALT 20 0 - 32 IU/L  Valproic acid level     Status: None   Collection Time: 10/11/19  3:50 PM  Result Value Ref Range   Valproic Acid Lvl 71 50 - 100 ug/mL    Comment:                                 Detection Limit = 4                            <4 indicates None Detected Toxicity may occur at levels of 100-500. Measurements of free unbound valproic acid may improve the assess- ment of clinical response.   CBC with Differential/Platelet     Status: Abnormal   Collection Time: 10/11/19  3:50 PM  Result Value Ref Range   WBC 6.2 3.4 - 10.8 x10E3/uL   RBC 4.69 3.77 - 5.28 x10E6/uL   Hemoglobin 14.0 11.1 - 15.9 g/dL   Hematocrit 41.1 34.0 - 46.6 %   MCV 88 79 - 97 fL   MCH 29.9 26.6 - 33.0 pg   MCHC 34.1 31.5 - 35.7 g/dL   RDW 13.5 11.7 - 15.4 %   Platelets 100 (LL) 150 - 450 x10E3/uL   Neutrophils 66 Not Estab. %   Lymphs 21 Not Estab. %   Monocytes 7 Not Estab. %   Eos 5 Not Estab. %   Basos 0 Not Estab. %   Neutrophils Absolute 4.1 1.4 - 7.0 x10E3/uL   Lymphocytes Absolute 1.3 0.7 - 3.1 x10E3/uL   Monocytes Absolute 0.5 0.1 - 0.9 x10E3/uL   EOS (  ABSOLUTE) 0.3 0.0 - 0.4  x10E3/uL   Basophils Absolute 0.0 0.0 - 0.2 x10E3/uL   Immature Granulocytes 1 Not Estab. %   Immature Grans (Abs) 0.0 0.0 - 0.1 x10E3/uL   Hematology Comments: Note:     Comment: Verified by microscopic examination.  Lipid panel     Status: None   Collection Time: 10/11/19  3:50 PM  Result Value Ref Range   Cholesterol, Total 165 100 - 199 mg/dL   Triglycerides 87 0 - 149 mg/dL   HDL 72 >39 mg/dL   VLDL Cholesterol Cal 16 5 - 40 mg/dL   LDL Chol Calc (NIH) 77 0 - 99 mg/dL   Chol/HDL Ratio 2.3 0.0 - 4.4 ratio    Comment:                                   T. Chol/HDL Ratio                                             Men  Women                               1/2 Avg.Risk  3.4    3.3                                   Avg.Risk  5.0    4.4                                2X Avg.Risk  9.6    7.1                                3X Avg.Risk 23.4   11.0   Blood culture (routine x 2)     Status: None   Collection Time: 10/21/19  7:03 PM   Specimen: BLOOD  Result Value Ref Range   Specimen Description BLOOD RIGHT ANTECUBITAL    Special Requests      BOTTLES DRAWN AEROBIC AND ANAEROBIC Blood Culture adequate volume   Culture      NO GROWTH 5 DAYS Performed at [facility], [place] [address] [place], [place] [address]    Report Status 10/27/2019 FINAL   Lactic acid, plasma     Status: None   Collection Time: 10/21/19  7:05 PM  Result Value Ref Range   Lactic Acid, Venous 1.2 0.5 - 1.9 mmol/L    Comment: Performed at [facility] [address] [place], [place] [address]  Comprehensive metabolic panel     Status: Abnormal   Collection Time: 10/21/19  7:05 PM  Result Value Ref Range   Sodium 135 135 - 145 mmol/L   Potassium 3.7 3.5 - 5.1 mmol/L   Chloride 100 98 - 111 mmol/L   CO2 24 22 - 32 mmol/L   Glucose, Bld 89 70 - 99 mg/dL   BUN 11 8 - 23 mg/dL   Creatinine, Ser 0.94 0.44 - 1.00 mg/dL   Calcium 9.0 8.9 - 10.3 mg/dL   Total Protein 7.7 6.5 -  8.1 g/dL   Albumin 3.5  3.5 - 5.0 g/dL   AST 32 15 - 41 U/L   ALT 23 0 - 44 U/L   Alkaline Phosphatase 141 (H) 38 - 126 U/L   Total Bilirubin 0.7 0.3 - 1.2 mg/dL   GFR calc non Af Amer >60 >60 mL/min   GFR calc Af Amer >60 >60 mL/min   Anion gap 11 5 - 15    Comment: Performed at [facility] [address], [place], [place] [address]  CBC with Differential     Status: None   Collection Time: 10/21/19  7:05 PM  Result Value Ref Range   WBC 4.8 4.0 - 10.5 K/uL   RBC 4.57 3.87 - 5.11 MIL/uL   Hemoglobin 13.7 12.0 - 15.0 g/dL   HCT 42.2 36.0 - 46.0 %   MCV 92.3 80.0 - 100.0 fL   MCH 30.0 26.0 - 34.0 pg   MCHC 32.5 30.0 - 36.0 g/dL   RDW 13.7 11.5 - 15.5 %   Platelets  150 - 400 K/uL    PLATELET CLUMPS NOTED ON SMEAR, COUNT APPEARS DECREASED    Comment: Immature Platelet Fraction may be clinically indicated, consider ordering this additional test [id number]    nRBC 0.0 0.0 - 0.2 %   Neutrophils Relative % 61 %   Neutro Abs 2.9 1.7 - 7.7 K/uL   Lymphocytes Relative 25 %   Lymphs Abs 1.2 0.7 - 4.0 K/uL   Monocytes Relative 9 %   Monocytes Absolute 0.4 0.1 - 1.0 K/uL   Eosinophils Relative 4 %   Eosinophils Absolute 0.2 0.0 - 0.5 K/uL   Basophils Relative 0 %   Basophils Absolute 0.0 0.0 - 0.1 K/uL   Immature Granulocytes 1 %   Abs Immature Granulocytes 0.05 0.00 - 0.07 K/uL    Comment: Performed at [facility], [address] [place], [place] [address]  Lactic acid, plasma     Status: None   Collection Time: 10/21/19 11:05 PM  Result Value Ref Range   Lactic Acid, Venous 1.5 0.5 - 1.9 mmol/L    Comment: Performed at [facility] [address], [place], [place] [address]  Sedimentation rate     Status: None   Collection Time: 10/21/19 11:15 PM  Result Value Ref Range   Sed Rate 14 0 - 22 mm/hr    Comment: Performed at [facility] [address], [place], [place] [address]  C-reactive protein     Status: None   Collection Time: 10/21/19 11:15 PM  Result Value Ref  Range   CRP <0.8 <1.0 mg/dL    Comment: Performed at [facility] [address] [place], [place] [address]  Blood culture (routine x 2)     Status: None   Collection Time: 10/21/19 11:15 PM   Specimen: BLOOD RIGHT FOREARM  Result Value Ref Range   Specimen Description BLOOD RIGHT FOREARM    Special Requests      BOTTLES DRAWN AEROBIC AND ANAEROBIC Blood Culture adequate volume   Culture      NO GROWTH 5 DAYS Performed at [facility], [place] [address], [place], [place] [address]    Report Status 10/27/2019 FINAL   SARS CORONAVIRUS 2 (TAT 6-24 HRS) Nasopharyngeal Nasopharyngeal Swab     Status: None   Collection Time: 10/21/19 11:55 PM   Specimen: Nasopharyngeal Swab  Result Value Ref Range   SARS Coronavirus 2 NEGATIVE  NEGATIVE    Comment: (NOTE) SARS-CoV-2 target nucleic acids are NOT DETECTED. The SARS-CoV-2 RNA is generally detectable in upper and lower respiratory specimens during the acute phase of infection. Negative results do not preclude SARS-CoV-2 infection, do not rule out co-infections with other pathogens, and should not be used as the sole basis for treatment or other patient management decisions. Negative results must be combined with clinical observations, patient history, and epidemiological information. The expected result is Negative. Fact Sheet for Patients: SugarRoll.be Fact Sheet for Healthcare Providers: https://www.woods-mathews.com/ This test is not yet approved or cleared by the [place] FDA and  has been authorized for detection and/or diagnosis of SARS-CoV-2 by FDA under an Emergency Use Authorization (EUA). This EUA will remain  in effect (meaning this test can be used) for the duration of the COVID-19 declaration under Section 56 4(b)(1) of the Act, 21 U.S.C. section 360bbb-3(b)(1), unless the authorization is terminated or revoked sooner. Performed at [facility], [place] [address] [place], [place] [address]   HIV Antibody (routine testing w rflx)     Status: None   Collection Time: 10/22/19  3:30 AM  Result Value Ref Range   HIV Screen 4th Generation wRfx NON REACTIVE NON REACTIVE    Comment: Performed at [facility] [address], [place], [place] [address]  Basic metabolic panel     Status: Abnormal   Collection Time: 10/22/19  3:30 AM  Result Value Ref Range   Sodium 136 135 - 145 mmol/L   Potassium 3.4 (L) 3.5 - 5.1 mmol/L   Chloride 102 98 - 111 mmol/L   CO2 26 22 - 32 mmol/L   Glucose, Bld 91 70 - 99 mg/dL   BUN 11 8 - 23 mg/dL   Creatinine, Ser 0.77 0.44 - 1.00 mg/dL   Calcium 8.7 (L) 8.9 - 10.3 mg/dL   GFR calc non Af Amer >60 >60 mL/min   GFR calc Af Amer >60 >60 mL/min   Anion gap 8 5 - 15    Comment: Performed at [facility] [address] [place], [place] [address]  CBC     Status: Abnormal   Collection Time: 10/22/19  3:30 AM  Result Value Ref Range   WBC 3.8 (L) 4.0 - 10.5 K/uL   RBC 3.82 (L) 3.87 - 5.11 MIL/uL   Hemoglobin 11.5 (L) 12.0 - 15.0 g/dL   HCT 35.2 (L) 36.0 - 46.0 %   MCV 92.1 80.0 - 100.0 fL   MCH 30.1 26.0 - 34.0 pg   MCHC 32.7 30.0 - 36.0 g/dL   RDW 13.8 11.5 - 15.5 %   Platelets 101 (L) 150 - 400 K/uL    Comment: REPEATED TO VERIFY PLATELET COUNT CONFIRMED BY SMEAR SPECIMEN CHECKED FOR CLOTS Immature Platelet Fraction may be clinically indicated, consider ordering this additional test [id number]    nRBC 0.0 0.0 - 0.2 %    Comment: Performed at [facility], [place] [address], [place] [address]  CBC     Status: Abnormal   Collection Time: 10/23/19  4:57 AM  Result Value Ref Range   WBC 4.0 4.0 - 10.5 K/uL   RBC 4.00 3.87 - 5.11 MIL/uL   Hemoglobin 12.0 12.0 - 15.0 g/dL   HCT 37.1 36.0 - 46.0 %   MCV 92.8 80.0 - 100.0 fL   MCH 30.0 26.0 - 34.0 pg   MCHC 32.3 30.0 - 36.0 g/dL   RDW 13.8  11.5 - 15.5 %   Platelets 93 (L) 150 - 400 K/uL    Comment: Immature Platelet Fraction may  be clinically indicated, consider ordering this additional test [id number] CONSISTENT WITH PREVIOUS RESULT    nRBC 0.0 0.0 - 0.2 %    Comment: Performed at [facility], [place] [address] [place], [place] [address]  Basic metabolic panel     Status: Abnormal   Collection Time: 10/23/19  4:57 AM  Result Value Ref Range   Sodium 135 135 - 145 mmol/L   Potassium 4.3 3.5 - 5.1 mmol/L   Chloride 102 98 - 111 mmol/L   CO2 23 22 - 32 mmol/L   Glucose, Bld 82 70 - 99 mg/dL   BUN 8 8 - 23 mg/dL   Creatinine, Ser 0.57 0.44 - 1.00 mg/dL   Calcium 8.7 (L) 8.9 - 10.3 mg/dL   GFR calc non Af Amer >60 >60 mL/min   GFR calc Af Amer >60 >60 mL/min   Anion gap 10 5 - 15    Comment: Performed at [facility], [place] [address] [place], [place] [address]  CBC     Status: Abnormal   Collection Time: 10/26/19  5:19 AM  Result Value Ref Range   WBC 5.0 4.0 - 10.5 K/uL   RBC 4.17 3.87 - 5.11 MIL/uL   Hemoglobin 12.6 12.0 - 15.0 g/dL   HCT 38.6 36.0 - 46.0 %   MCV 92.6 80.0 - 100.0 fL   MCH 30.2 26.0 - 34.0 pg   MCHC 32.6 30.0 - 36.0 g/dL   RDW 13.9 11.5 - 15.5 %   Platelets 98 (L) 150 - 400 K/uL    Comment: REPEATED TO VERIFY Immature Platelet Fraction may be clinically indicated, consider ordering this additional test [id number] CONSISTENT WITH PREVIOUS RESULT    nRBC 0.0 0.0 - 0.2 %    Comment: Performed at [facility], [place] [address] [place], [place] [address]  Basic metabolic panel     Status: Abnormal   Collection Time: 10/26/19  5:19 AM  Result Value Ref Range   Sodium 134 (L) 135 - 145 mmol/L   Potassium 4.2 3.5 - 5.1 mmol/L   Chloride 99 98 - 111 mmol/L   CO2 26 22 - 32 mmol/L   Glucose, Bld 97 70 - 99 mg/dL   BUN 10 8 - 23 mg/dL   Creatinine, Ser 0.61 0.44 - 1.00 mg/dL   Calcium 8.9 8.9 - 10.3 mg/dL   GFR calc non Af Amer >60 >60 mL/min   GFR calc Af Amer >60 >60 mL/min   Anion gap 9 5 - 15    Comment: Performed at [facility], [place] [address]  [place], [place] [address]     Psychiatric Specialty Exam: Physical Exam  ROS  There were no vitals taken for this visit.There is no height or weight on file to calculate BMI.  General Appearance: NA  Eye Contact:  NA  Speech:  Slow  Volume:  Normal  Mood:  Irritable  Affect:  NA  Thought Process:  Descriptions of Associations: Intact  Orientation:  Full (Time, Place, and Person)  Thought Content:  Rumination  Suicidal Thoughts:  No  Homicidal Thoughts:  No  Memory:  Immediate;   Good Recent;   Good Remote;   Good  Judgement:  Good  Insight:  Fair  Psychomotor Activity:  NA  Concentration:  Concentration: Fair and Attention Span: Fair  Recall:  Good  Fund of Knowledge:  Good  Language:  Good  Akathisia:  No  Handed:  Right  AIMS (if indicated):     Assets:  Communication Skills Desire for Improvement Housing Resilience Social Support  ADL's:  Intact  Cognition:  WNL  Sleep:   fair      Assessment and Plan: Bipolar disorder type I.  Generalized anxiety disorder.  I reviewed her current medication and recent blood work results.  Her platelet remains low which is 101.  Recommend to try Depakote 1000 mg at bedtime and cut down her nortriptyline to 50 mg as it may be contributing to mania.  She is already taking venlafaxine 75 mg daily.  Discussed medication side effects and benefits.  If patient do not see any improvement then will consider changing the medication.  Recommended to call us back if he has any question or any concern.  Follow-up in 6 weeks to 8 weeks.  Follow Up Instructions:    I discussed the assessment and treatment plan with the patient. The patient was provided an opportunity to ask questions and all were answered. The patient agreed with the plan and demonstrated an understanding of the instructions.   The patient was advised to call back or seek an in-person evaluation if the symptoms worsen or if the condition fails to improve as anticipated.  I  provided 20 minutes of non-face-to-face time during this encounter.   Kathlee Nations, MD

## 2019-11-11 ENCOUNTER — Other Ambulatory Visit: Payer: Self-pay

## 2019-11-11 ENCOUNTER — Telehealth (INDEPENDENT_AMBULATORY_CARE_PROVIDER_SITE_OTHER): Payer: Medicare Other | Admitting: Family Medicine

## 2019-11-11 ENCOUNTER — Encounter: Payer: Self-pay | Admitting: Family Medicine

## 2019-11-11 MED ORDER — HYDROCODONE-ACETAMINOPHEN 5-325 MG PO TABS: 1.0000 | ORAL_TABLET | Freq: Four times a day (QID) | ORAL | 0 refills | Status: DC | PRN

## 2019-11-11 NOTE — Progress Notes (Signed)
Wants a refill of Hydrocodone   Hospital f/u    [place] on steps and landed on bottom (bottom hurts since the night before thanksgiving)  Burning with  Urination started this past  weekend.  Uncomfortable private area  Frequency & Urgency \   Spoke with provider and she has looked at pt chart and decided that Urgent visit is more ap due to the urinary sx along with body chills. There is and additional recommendation to f/u in a week from now as an in house-office visit.

## 2019-11-11 NOTE — Telephone Encounter (Signed)
pmp reviewd, appropriate meds refilled 

## 2019-11-11 NOTE — Patient Instructions (Signed)
° ° ° °  If you have lab work done today you will be contacted with your lab results within the next 2 weeks.  If you have not heard from us then please contact us. The fastest way to get your results is to register for My Chart. ° ° °IF you received an x-ray today, you will receive an invoice from [facility]. Please contact [facility] at [phone] with questions or concerns regarding your invoice.  ° °IF you received labwork today, you will receive an invoice from [facility]. Please contact [facility] at [phone] with questions or concerns regarding your invoice.  ° °Our billing staff will not be able to assist you with questions regarding bills from these companies. ° °You will be contacted with the lab results as soon as they are available. The fastest way to get your results is to activate your My Chart account. Instructions are located on the last page of this paperwork. If you have not heard from us regarding the results in 2 weeks, please contact this office. °  ° ° ° °

## 2019-11-11 NOTE — Telephone Encounter (Signed)
Spoke to the provider and pt will go to urgent care today and is willing to send RX since pt is not able to be seen today. Rx Pended  Please advise on scheduling pt for IN-House Office Visit for 1 week from how as a urgent care f/ u appt.

## 2019-11-14 ENCOUNTER — Ambulatory Visit (HOSPITAL_COMMUNITY): Payer: Medicare Other | Admitting: Psychiatry

## 2019-11-16 ENCOUNTER — Ambulatory Visit (HOSPITAL_COMMUNITY): Payer: Medicare Other | Admitting: Psychiatry

## 2019-11-16 NOTE — Telephone Encounter (Signed)
Called pt to set up 1 week follow up from Urgent care visit  LVM  FR

## 2019-11-18 ENCOUNTER — Other Ambulatory Visit: Payer: Self-pay

## 2019-11-18 ENCOUNTER — Encounter (HOSPITAL_COMMUNITY): Payer: Self-pay

## 2019-11-18 ENCOUNTER — Ambulatory Visit (HOSPITAL_COMMUNITY)
Admission: EM | Admit: 2019-11-18 | Discharge: 2019-11-18 | Disposition: A | Discharge status: Home / Self Care | Payer: Medicare Other | Attending: Family Medicine | Admitting: Family Medicine

## 2019-11-18 DIAGNOSIS — N3000 Acute cystitis without hematuria: Secondary | ICD-10-CM | POA: Diagnosis present

## 2019-11-18 DIAGNOSIS — L02419 Cutaneous abscess of limb, unspecified: Secondary | ICD-10-CM | POA: Diagnosis present

## 2019-11-18 DIAGNOSIS — L03119 Cellulitis of unspecified part of limb: Secondary | ICD-10-CM

## 2019-11-18 LAB — POCT URINALYSIS DIP (DEVICE)
Bilirubin Urine: NEGATIVE
Glucose, UA: NEGATIVE mg/dL
Hgb urine dipstick: NEGATIVE
Ketones, ur: NEGATIVE mg/dL
Nitrite: POSITIVE — AB
Protein, ur: NEGATIVE mg/dL
Specific Gravity, Urine: 1.015 (ref 1.005–1.030)
Urobilinogen, UA: 0.2 mg/dL (ref 0.0–1.0)
pH: 7 (ref 5.0–8.0)

## 2019-11-18 MED ORDER — DOXYCYCLINE HYCLATE 100 MG PO CAPS: 100.0000 mg | ORAL_CAPSULE | Freq: Two times a day (BID) | ORAL | 0 refills | Status: DC

## 2019-11-18 NOTE — ED Provider Notes (Signed)
[facility]    CSN: [id number] Arrival date & time: 11/18/19  1209      History   Chief Complaint Chief Complaint  Patient presents with  . Urinary Tract Infection    HPI Catherine Kelley is a 68 y.o. female.   Patient reports to urgent care today for concern of UTI and Right knee abscess. Patient reports 2 weeks of worsening urinary frequency, burning and urgency. She denies fever, chills, blood in her urine or abdominal pain. She also reports a right knee wound/abscess which has been present since late November. Patient was admitted for 4 days to the hospital for cellulitis of the Right lower leg following a fall on 11/25. She was discharged on Keflex and completed this course. She had some resolution of redness and pain but this has since started to increase. She notes focal pain over the wound but no pain with movement of the knee. She reports the area of redness has expanded and is warm to touch. She endorses some purulent discharge from the wound as well. As well she believes her Right lower leg is slightly more swollen than her left. She is concerned this is a returning infection.   She did not have in person follow up following her Hospital admission and was scheduled for telehealth on 12/11 but recommended to be seen in person.      Past Medical History:  Diagnosis Date  . Anxiety   . Arthritis    "jooints" (09/18/2017)  . Asthma   . Bipolar disorder ([place])   . Bipolar I disorder ([place]) 10/11/2019  . CAP (community acquired pneumonia) 09/18/2017  . Cat allergies   . Chronic bronchitis ([place])    "get it alot; maybe not q yr" (07/06/2014)  . Chronic lower back pain    "spurs and pinched nerves" (09/18/2017)  . COPD (chronic obstructive pulmonary disease) ([place])   . DDD (degenerative disc disease), lumbar   . Depression    psychiatrist Dr. Toy Care  . Diastolic dysfunction   . Emphysematous COPD ([place])    changes in right base along with patchy areas on last x-ray  .  Fall as cause of accidental injury at home as place of occurrence 10/11/2019  . Fall involving wheelchair as cause of accidental injury 10/11/2019  . Falls frequently    "several in 2017; fell at dr's office yesterday" (09/18/2017)  . GERD (gastroesophageal reflux disease)   . HCAP (healthcare-associated pneumonia) 02/12/2016  . Heart murmur   . History of cardiovascular stress test 08/15/2004   EF of 70% -- Normal stress cardiolite.  There is no evidence of ischemia and there is normal LV function. -- [name]. MD  . History of echocardiogram 12/24/2006   Est. EF of 62-95% NORMAL LV SYSTOLIC FUNCTION WITH IMPAIRED RELAXATION -- MILD AORTIC SCLEROSIS -- NORMAL PALONARY ARTERY PRESSURE -- NO OLD ECHOS FOR COMPARISON -- [name], MD  . Hypertension    essential hypertension  . Migraine    "when I was having my periods; none since then" (09/18/2017)  . Necrotic pneumonia ([place])    recurrent/notes 02/12/2016  . Pneumonia    "several times since May 2015" (07/06/2014)  . Pollen allergies   . Tobacco abuse    smoking about 1/2 pk of cigarettes a day  . Tobacco dependence due to cigarettes 10/11/2019  . Traumatic hematoma of knee, right, initial encounter 10/11/2019    Patient Active Problem List   Diagnosis Date Noted  . Cellulitis of  right lower extremity 10/22/2019  . Cellulitis 10/21/2019  . Bipolar I disorder ([place]) 10/11/2019  . Tobacco dependence due to cigarettes 10/11/2019  . Fall as cause of accidental injury at home as place of occurrence 10/11/2019  . Traumatic hematoma of knee, right, initial encounter 10/11/2019  . Lower respiratory infection 12/22/2018  . Senile ecchymosis 09/11/2018  . CAP (community acquired pneumonia) 09/17/2017  . Near syncope 09/17/2017  . Tobacco abuse 02/12/2016  . Other fatigue 02/04/2016  . Respiratory failure with hypoxia ([place]) 01/09/2016  . Malnutrition of moderate degree 11/03/2015  . Pulmonary emphysema ([place])   . Essential  hypertension 09/27/2015  . Rhabdomyolysis 08/16/2015  . Cough 06/07/2015  . Bronchiectasis with acute exacerbation ([place]) 03/07/2015  . Cigarette smoker 12/13/2014  . Stage 2 moderate COPD by GOLD classification ([place]) 10/11/2014  . Lumbar back pain with radiculopathy affecting right lower extremity 09/28/2014  . Hyponatremia 07/09/2014  . Hx of recurrent pneumonia 07/08/2014  . Venous insufficiency (chronic) (peripheral) 09/29/2012  . Lower extremity edema 03/09/2012  . Benign hypertensive heart disease without heart failure 05/09/2011  . Chest pain 05/09/2011  . Dyslipidemia 05/09/2011  . Mood disorder ([place]) 05/09/2011  . Osteoarthritis 05/09/2011    Past Surgical History:  Procedure Laterality Date  . CAROTID STENT    . DILATION AND CURETTAGE OF UTERUS    . TUBAL LIGATION  1986    OB History   No obstetric history on file.      Home Medications    Prior to Admission medications   Medication Sig Start Date End Date Taking? Authorizing Provider  albuterol (PROVENTIL) (2.5 MG/3ML) 0.083% nebulizer solution Take 2.5 mg by nebulization every 6 (six) hours as needed for wheezing or shortness of breath.    [provider]  amLODipine (NORVASC) 2.5 MG tablet Take 1 tablet (2.5 mg total) by mouth daily. 07/29/19   Rutherford Guys, MD  aspirin EC 81 MG tablet Take 81 mg by mouth daily as needed.     [provider]  budesonide-formoterol (SYMBICORT) 160-4.5 MCG/ACT inhaler Inhale 2 puffs into the lungs 2 (two) times daily. 09/19/17   Doreatha Lew, MD  calcium carbonate (TUMS EX) 750 MG chewable tablet Chew 1 tablet by mouth 2 (two) times daily as needed for heartburn.    [provider]  divalproex (DEPAKOTE ER) 500 MG 24 hr tablet Take 2 tablets (1,000 mg total) by mouth at bedtime. 11/04/19   Arfeen, Arlyce Harman, MD  doxycycline (VIBRAMYCIN) 100 MG capsule Take 1 capsule (100 mg total) by mouth 2 (two) times daily. 11/18/19   Neyla Gauntt, Marguerita Beards, PA-C    fluticasone (FLONASE) 50 MCG/ACT nasal spray Place 2 sprays into both nostrils daily. 07/15/19   Maximiano Coss, NP  HYDROcodone-acetaminophen (NORCO/VICODIN) 5-325 MG tablet Take 1-2 tablets by mouth every 6 (six) hours as needed for up to 30 doses for moderate pain. 11/11/19   Rutherford Guys, MD  hydrOXYzine (ATARAX/VISTARIL) 10 MG tablet Take 1 tablet (10 mg total) by mouth at bedtime as needed for anxiety. 09/22/19   Arfeen, Arlyce Harman, MD  methocarbamol (ROBAXIN) 500 MG tablet Take 1 tablet (500 mg total) by mouth every 8 (eight) hours as needed for muscle spasms. 07/29/19   Rutherford Guys, MD  nortriptyline (PAMELOR) 50 MG capsule Take 1 capsule (50 mg total) by mouth at bedtime. 11/04/19   Arfeen, Arlyce Harman, MD  PRESCRIPTION MEDICATION High-Frequency Chest Wall Oscillation (the Vest):    [provider]  spironolactone (  ALDACTONE) 50 MG tablet Take 1 tablet (50 mg total) by mouth daily. Office visit needed for refills 10/31/19   Rutherford Guys, MD  triamcinolone cream (KENALOG) 0.1 % Apply 1 application topically 2 (two) times daily. 10/18/19   Wendall Mola, NP  venlafaxine (EFFEXOR) 75 MG tablet Take 1 tablet (75 mg total) by mouth daily. 11/04/19 11/03/20  Arfeen, Arlyce Harman, MD  VENTOLIN HFA 108 (90 Base) MCG/ACT inhaler TAKE 2 PUFFS BY MOUTH EVERY 6 HOURS AS NEEDED FOR WHEEZE OR SHORTNESS OF BREATH Patient taking differently: Inhale 2 puffs into the lungs every 6 (six) hours as needed for wheezing or shortness of breath.  05/20/19   Rutherford Guys, MD  Vitamin D, Ergocalciferol, (DRISDOL) 1.25 MG (50000 UT) CAPS capsule Take 1 capsule (50,000 Units total) by mouth every 7 (seven) days. Patient taking differently: Take 50,000 Units by mouth every Tuesday.  10/14/19   Wendall Mola, NP    Family History Family History  Problem Relation Age of Onset  . Stroke Mother   . Heart disease Father   . Hyperlipidemia Father   . Hypertension Father   . Breast cancer Maternal Aunt    . Asthma Maternal Grandmother     Social History Social History   Tobacco Use  . Smoking status: Current Every Day Smoker    Packs/day: 0.50    Years: 46.00    Pack years: 23.00    Types: Cigarettes  . Smokeless tobacco: Never Used  Substance Use Topics  . Alcohol use: Yes    Alcohol/week: 0.0 standard drinks    Comment: 09/18/2017 "might have 1 drink/month; if that"  . Drug use: No     Allergies   Sulfa drugs cross reactors, Ceclor [cefaclor], Depakote er [divalproex sodium er], Escitalopram oxalate, Latex, Sertraline hcl, and Tape   Review of Systems Review of Systems  Constitutional: Negative for chills and fever.  HENT: Negative for congestion and sore throat.   Eyes: Negative for visual disturbance.  Respiratory: Negative for cough and shortness of breath.   Cardiovascular: Positive for leg swelling. Negative for chest pain and palpitations.  Gastrointestinal: Negative for abdominal pain, constipation, diarrhea, nausea and vomiting.  Genitourinary: Positive for dysuria, frequency and urgency. Negative for hematuria, vaginal bleeding, vaginal discharge and vaginal pain.  Musculoskeletal: Positive for arthralgias and joint swelling. Negative for back pain.  Skin: Negative for color change and rash.  Neurological: Negative for seizures, syncope, light-headedness and headaches.  Hematological: Does not bruise/bleed easily.  Psychiatric/Behavioral: Negative.   All other systems reviewed and are negative.    Physical Exam Triage Vital Signs ED Triage Vitals  Enc Vitals Group     BP 11/18/19 1230 116/74     Pulse Rate 11/18/19 1230 (!) 103     Resp 11/18/19 1230 17     Temp 11/18/19 1230 98.1 F (36.7 C)     Temp Source 11/18/19 1230 Oral     SpO2 11/18/19 1230 92 %     Weight 11/18/19 1228 115 lb (52.2 kg)     Height --      Head Circumference --      Peak Flow --      Pain Score 11/18/19 1228 6     Pain Loc --      Pain Edu? --      Excl. in [employer]? --     No data found.  Updated Vital Signs BP 116/74 (BP Location: Left Arm)   Pulse (!) 103  Temp 98.1 F (36.7 C) (Oral)   Resp 17   Wt 115 lb (52.2 kg)   SpO2 92%   BMI 20.37 kg/m   Visual Acuity Right Eye Distance:   Left Eye Distance:   Bilateral Distance:    Right Eye Near:   Left Eye Near:    Bilateral Near:     Physical Exam Vitals and nursing note reviewed.  Constitutional:      General: She is not in acute distress.    Appearance: Normal appearance. She is well-developed. She is not ill-appearing.  HENT:     Head: Normocephalic and atraumatic.     Nose: Nose normal.     Mouth/Throat:     Mouth: Mucous membranes are dry.  Eyes:     General: No scleral icterus.    Conjunctiva/sclera: Conjunctivae normal.     Pupils: Pupils are equal, round, and reactive to light.  Cardiovascular:     Rate and Rhythm: Normal rate and regular rhythm.     Heart sounds: Normal heart sounds. No murmur. No friction rub. No gallop.      Comments: Heart sounds distant but appreciated. Pedal pulses are faint. Radial strong and regular Pulmonary:     Effort: Pulmonary effort is normal. No respiratory distress.     Breath sounds: Normal breath sounds. No wheezing.     Comments: Rhonchi present but clear with cough. Good overall air movement  Abdominal:     General: Abdomen is flat. Bowel sounds are normal.     Palpations: Abdomen is soft.     Tenderness: There is no abdominal tenderness. There is no right CVA tenderness or left CVA tenderness.  Musculoskeletal:     Cervical back: Neck supple.     Comments: 2cm x 2cm fluctuant abscess with healing scab overlying the right patellar ligament with surrounding erythema. No discharge appreciate in clinic. TTP along tibial plateau. ROM normal without pain.   Blanching erythema along entire RLE > LLE. Trace edema in RLE, non-pitting. No calf tenderness. TTP along anterior Tibia of RLE.  Evidence of chronic venous insufficiency.   Skin:     General: Skin is warm and dry.  Neurological:     General: No focal deficit present.     Mental Status: She is alert and oriented to person, place, and time.  Psychiatric:        Mood and Affect: Mood normal.        Behavior: Behavior normal.        Thought Content: Thought content normal.        Judgment: Judgment normal.      UC Treatments / Results  Labs (all labs ordered are listed, but only abnormal results are displayed) Labs Reviewed  POCT URINALYSIS DIP (DEVICE) - Abnormal; Notable for the following components:      Result Value   Nitrite POSITIVE (*)    Leukocytes,Ua TRACE (*)    All other components within normal limits  URINE CULTURE    EKG   Radiology No results found.  Procedures Procedures (including critical care time)  Medications Ordered in UC Medications - No data to display  Initial Impression / Assessment and Plan / UC Course  I have reviewed the triage vital signs and the nursing notes.  Pertinent labs & imaging results that were available during my care of the patient were reviewed by me and considered in my medical decision making (see chart for details).     # Uncomplicated UTI - UA  showed [place] and nitrites. Culture sent. Treating with doxycycline based on previous cultures showing E.coli susceptible to tetracyclines. Decision was discussed with supervising physician with hope to cover both skin infection flora and UTI.  #Right Knee abscess - Based on exam it was determined this needs further evaluation by orthopedics. Spoke with Dr. Griffin Basil via phone and discussed our lack of comfort with lancing abscess in the Urgent Care setting today. We advised patient to call for an appointment as early next week as possible. Will cover with doxycycline for now. Also told patient that if unable to be seen next week by Orthopedics, to contact her primary care in order to further manage care.   Final Clinical Impressions(s) / UC Diagnoses   Final  diagnoses:  Acute cystitis without hematuria  Cellulitis and abscess of leg, except foot     Discharge Instructions     Call the orthopedic Office we discussed today to set up an appointment to be seen next week. If you are unable to be seen next week for this, contact your Primary Care to discussion further coordination of care.   I have sent a prescription for both your UTI and your skin infection. Take it as prescribed, 1 tablet , 2 times  day for 10 days. We have sent a urine culture and if we need to change this medication we will call you. Take this medication with plenty of water.  If you have worsening of swelling, pain, fever, chills, increased discharge, inability to move your knee, Go to the Emergency Department to be evaluated.     ED Prescriptions    Medication Sig Dispense Auth. Provider   doxycycline (VIBRAMYCIN) 100 MG capsule Take 1 capsule (100 mg total) by mouth 2 (two) times daily. 20 capsule Marvelle Caudill, Marguerita Beards, PA-C     PDMP not reviewed this encounter.   Purnell Shoemaker, PA-C 11/18/19 1542

## 2019-11-18 NOTE — ED Triage Notes (Signed)
Pt. States she fell & now she has an open cut that has not healed properly including her leg swelling. States she also has a UTI with the urge to constantly go/burning. She has taken AZO for 2 wks now.

## 2019-11-18 NOTE — Discharge Instructions (Addendum)
Call the orthopedic Office we discussed today to set up an appointment to be seen next week. If you are unable to be seen next week for this, contact your Primary Care to discussion further coordination of care.   I have sent a prescription for both your UTI and your skin infection. Take it as prescribed, 1 tablet , 2 times  day for 10 days. We have sent a urine culture and if we need to change this medication we will call you. Take this medication with plenty of water.  If you have worsening of swelling, pain, fever, chills, increased discharge, inability to move your knee, Go to the Emergency Department to be evaluated.

## 2019-11-18 NOTE — ED Notes (Signed)
Patient states she fell last night and injured her bottom.  Notes suggest the fall was December 11.

## 2019-11-21 ENCOUNTER — Telehealth (HOSPITAL_COMMUNITY): Payer: Self-pay | Admitting: Emergency Medicine

## 2019-11-21 LAB — URINE CULTURE: Culture: 40000 — AB

## 2019-11-21 NOTE — Telephone Encounter (Signed)
Reviewed chart with Dr. Lanny Cramp, pt is on doxy, states her burning is gone but urinary frequency is still there. Per Dr. Lanny Cramp, no change in treatment, if symptoms get worse, pt needs eval in ER. Pt encouraged to contact her PCP today regarding her urine results. Pt verbalized understanding, all questions answered.

## 2019-11-28 ENCOUNTER — Telehealth: Payer: Self-pay | Admitting: Family Medicine

## 2019-11-28 ENCOUNTER — Other Ambulatory Visit (HOSPITAL_COMMUNITY): Payer: Self-pay | Admitting: Psychiatry

## 2019-11-28 DIAGNOSIS — F319 Bipolar disorder, unspecified: Secondary | ICD-10-CM

## 2019-11-28 NOTE — Telephone Encounter (Signed)
Langley Gauss called in regards to some clarification on a referral for this pt . To see if Jens Som, NP wanted the pt to be in the program or just the scan   If so it would have to be scheduled through the pcp

## 2019-12-16 ENCOUNTER — Other Ambulatory Visit: Payer: Self-pay | Admitting: Family Medicine

## 2019-12-16 NOTE — Telephone Encounter (Signed)
Requested medication (s) are due for refill today: yes  Requested medication (s) are on the active medication list: yes  Last refill:  05/20/19  Future visit scheduled: no  Notes to clinic:  beta agonists failed  Requested Prescriptions  Pending Prescriptions Disp Refills   albuterol (VENTOLIN HFA) 108 (90 Base) MCG/ACT inhaler [Pharmacy Med Name: ALBUTEROL HFA (VENTOLIN) INH]  5    Sig: TAKE 2 PUFFS BY MOUTH EVERY 6 HOURS AS NEEDED FOR WHEEZE OR SHORTNESS OF BREATH      Pulmonology:  Beta Agonists Failed - 12/16/2019  6:25 PM      Failed - One inhaler should last at least one month. If the patient is requesting refills earlier, contact the patient to check for uncontrolled symptoms.      Passed - Valid encounter within last 12 months    Recent Outpatient Visits           1 month ago    [facility], Lilia Argue, MD   1 month ago Cellulitis of right lower extremity   [facility] at [facility], Lorelee Market, NP   2 months ago Acute upper respiratory infection   Primary Care at [facility], Lorelee Market, NP   3 months ago Acute cystitis with hematuria   [facility], Lilia Argue, MD   3 months ago Bleeding internal hemorrhoids   [facility] at [facility], Delfino Lovett, NP

## 2019-12-20 ENCOUNTER — Other Ambulatory Visit: Payer: Self-pay

## 2019-12-20 ENCOUNTER — Encounter (HOSPITAL_COMMUNITY): Payer: Self-pay | Admitting: Psychiatry

## 2019-12-20 ENCOUNTER — Ambulatory Visit (INDEPENDENT_AMBULATORY_CARE_PROVIDER_SITE_OTHER): Payer: Medicare Other | Admitting: Psychiatry

## 2019-12-20 ENCOUNTER — Other Ambulatory Visit (HOSPITAL_COMMUNITY): Payer: Self-pay | Admitting: Psychiatry

## 2019-12-20 DIAGNOSIS — F411 Generalized anxiety disorder: Secondary | ICD-10-CM | POA: Diagnosis not present

## 2019-12-20 DIAGNOSIS — F319 Bipolar disorder, unspecified: Secondary | ICD-10-CM

## 2019-12-20 MED ORDER — LITHIUM CARBONATE 300 MG PO CAPS: 300.0000 mg | ORAL_CAPSULE | Freq: Two times a day (BID) | ORAL | 0 refills | Status: DC

## 2019-12-20 MED ORDER — NORTRIPTYLINE HCL 50 MG PO CAPS: 50.0000 mg | ORAL_CAPSULE | Freq: Every day | ORAL | 0 refills | Status: DC

## 2019-12-20 NOTE — Progress Notes (Signed)
Virtual Visit via Telephone Note  I connected with Catherine Kelley on 12/20/19 at  2:00 PM EST by telephone and verified that I am speaking with the correct person using two identifiers.   I discussed the limitations, risks, security and privacy concerns of performing an evaluation and management service by telephone and the availability of in person appointments. I also discussed with the patient that there may be a patient responsible charge related to this service. The patient expressed understanding and agreed to proceed.   History of Present Illness: Patient was evaluated by phone session.  She admitted increased irritability, poor sleep, manic-like symptoms.  Her speech is fast but normal tone.  She gets easily distracted and her thought processes circumstantial.  She admitted unable to tolerate Depakote 1000 mg and back to 750.  She also forgot to decrease nortriptyline and continued on 100 mg.  Patient admitted increased energy especially at night when she talked too much.  She also endorsed confusion about the medication.  Patient was given multiple antibiotics in past few weeks due to cellulitis and UTI.  She like to try a different medication and wondering if she can take lithium.  In the past she had tried Abilify that caused stiffness.  She lives with her husband but admitted lately getting frustrated with him.  Patient denies any hallucination, paranoia or any suicidal thoughts.  She sleeps 6 hours but some nights she has difficulty falling asleep.  She denies drinking or using any illegal substances.  She reported her Christmas was sad because she remembered her mother who died 29 years ago from same time.  She feels her husband does not understand her emotions.  She is compliant with venlafaxine and all of her medication.  Recently given her pain medication but she is no longer taking it.  She is also not taking hydroxyzine because she is taking muscle relaxant and she does not want to take too  many medication.   Past Psychiatric History:Reviewed. H/Odepression,anxiety and bipolar disorder for long time. Had tried Paxil, Prozac and Zoloft that cause headaches. Abilify caused stiffnes. Took Xanax for many years however it was not renewed by primary care physician when [name] office did not accept her insurance. Did not recall any history of suicidal attempt or psychiatric inpatient treatment.   Recent Results (from the past 2160 hour(s))  Vitamin D 25 (osteoporosis screening)     Status: Abnormal   Collection Time: 10/11/19  3:50 PM  Result Value Ref Range   Vit D, 25-Hydroxy 9.3 (L) 30.0 - 100.0 ng/mL    Comment: Vitamin D deficiency has been defined by the [place] practice guideline as a level of serum 25-OH vitamin D less than 20 ng/mL (1,2). The Endocrine Society went on to further define vitamin D insufficiency as a level between 21 and 29 ng/mL (2). 1. IOM (Institute of Medicine). 2010. Dietary reference    intakes for calcium and D. [place]: The    [employer]. 2. Holick MF, [place], Bischoff-Ferrari HA, et al.    Evaluation, treatment, and prevention of vitamin D    deficiency: an Endocrine Society clinical practice    guideline. JCEM. 2011 Jul; 96(7):1911-30.   CMP14+EGFR     Status: Abnormal   Collection Time: 10/11/19  3:50 PM  Result Value Ref Range   Glucose 71 65 - 99 mg/dL   BUN 10 8 - 27 mg/dL   Creatinine, Ser 0.69 0.57 -  1.00 mg/dL   GFR calc non Af Amer 89 >59 mL/min/1.73   GFR calc Af Amer 103 >59 mL/min/1.73   BUN/Creatinine Ratio 14 12 - 28   Sodium 137 134 - 144 mmol/L   Potassium 4.3 3.5 - 5.2 mmol/L   Chloride 98 96 - 106 mmol/L   CO2 21 20 - 29 mmol/L   Calcium 9.4 8.7 - 10.3 mg/dL   Total Protein 7.5 6.0 - 8.5 g/dL   Albumin 4.4 3.8 - 4.8 g/dL   Globulin, Total 3.1 1.5 - 4.5 g/dL   Albumin/Globulin Ratio 1.4 1.2 - 2.2   Bilirubin Total 0.4 0.0 - 1.2 mg/dL   Alkaline  Phosphatase 154 (H) 39 - 117 IU/L   AST 30 0 - 40 IU/L   ALT 20 0 - 32 IU/L  Valproic acid level     Status: None   Collection Time: 10/11/19  3:50 PM  Result Value Ref Range   Valproic Acid Lvl 71 50 - 100 ug/mL    Comment:                                 Detection Limit = 4                            <4 indicates None Detected Toxicity may occur at levels of 100-500. Measurements of free unbound valproic acid may improve the assess- ment of clinical response.   CBC with Differential/Platelet     Status: Abnormal   Collection Time: 10/11/19  3:50 PM  Result Value Ref Range   WBC 6.2 3.4 - 10.8 x10E3/uL   RBC 4.69 3.77 - 5.28 x10E6/uL   Hemoglobin 14.0 11.1 - 15.9 g/dL   Hematocrit 41.1 34.0 - 46.6 %   MCV 88 79 - 97 fL   MCH 29.9 26.6 - 33.0 pg   MCHC 34.1 31.5 - 35.7 g/dL   RDW 13.5 11.7 - 15.4 %   Platelets 100 (LL) 150 - 450 x10E3/uL   Neutrophils 66 Not Estab. %   Lymphs 21 Not Estab. %   Monocytes 7 Not Estab. %   Eos 5 Not Estab. %   Basos 0 Not Estab. %   Neutrophils Absolute 4.1 1.4 - 7.0 x10E3/uL   Lymphocytes Absolute 1.3 0.7 - 3.1 x10E3/uL   Monocytes Absolute 0.5 0.1 - 0.9 x10E3/uL   EOS (ABSOLUTE) 0.3 0.0 - 0.4 x10E3/uL   Basophils Absolute 0.0 0.0 - 0.2 x10E3/uL   Immature Granulocytes 1 Not Estab. %   Immature Grans (Abs) 0.0 0.0 - 0.1 x10E3/uL   Hematology Comments: Note:     Comment: Verified by microscopic examination.  Lipid panel     Status: None   Collection Time: 10/11/19  3:50 PM  Result Value Ref Range   Cholesterol, Total 165 100 - 199 mg/dL   Triglycerides 87 0 - 149 mg/dL   HDL 72 >39 mg/dL   VLDL Cholesterol Cal 16 5 - 40 mg/dL   LDL Chol Calc (NIH) 77 0 - 99 mg/dL   Chol/HDL Ratio 2.3 0.0 - 4.4 ratio    Comment:                                   T. Chol/HDL Ratio  Men  Women                               1/2 Avg.Risk  3.4    3.3                                   Avg.Risk  5.0    4.4                                 2X Avg.Risk  9.6    7.1                                3X Avg.Risk 23.4   11.0   Blood culture (routine x 2)     Status: None   Collection Time: 10/21/19  7:03 PM   Specimen: BLOOD  Result Value Ref Range   Specimen Description BLOOD RIGHT ANTECUBITAL    Special Requests      BOTTLES DRAWN AEROBIC AND ANAEROBIC Blood Culture adequate volume   Culture      NO GROWTH 5 DAYS Performed at [facility], [place] [address] [place], [place] [address]    Report Status 10/27/2019 FINAL   Lactic acid, plasma     Status: None   Collection Time: 10/21/19  7:05 PM  Result Value Ref Range   Lactic Acid, Venous 1.2 0.5 - 1.9 mmol/L    Comment: Performed at [facility] [address], [place], [place] [address]  Comprehensive metabolic panel     Status: Abnormal   Collection Time: 10/21/19  7:05 PM  Result Value Ref Range   Sodium 135 135 - 145 mmol/L   Potassium 3.7 3.5 - 5.1 mmol/L   Chloride 100 98 - 111 mmol/L   CO2 24 22 - 32 mmol/L   Glucose, Bld 89 70 - 99 mg/dL   BUN 11 8 - 23 mg/dL   Creatinine, Ser 0.94 0.44 - 1.00 mg/dL   Calcium 9.0 8.9 - 10.3 mg/dL   Total Protein 7.7 6.5 - 8.1 g/dL   Albumin 3.5 3.5 - 5.0 g/dL   AST 32 15 - 41 U/L   ALT 23 0 - 44 U/L   Alkaline Phosphatase 141 (H) 38 - 126 U/L   Total Bilirubin 0.7 0.3 - 1.2 mg/dL   GFR calc non Af Amer >60 >60 mL/min   GFR calc Af Amer >60 >60 mL/min   Anion gap 11 5 - 15    Comment: Performed at [facility] [address] [place], [place] [address]  CBC with Differential     Status: None   Collection Time: 10/21/19  7:05 PM  Result Value Ref Range   WBC 4.8 4.0 - 10.5 K/uL   RBC 4.57 3.87 - 5.11 MIL/uL   Hemoglobin 13.7 12.0 - 15.0 g/dL   HCT 42.2 36.0 - 46.0 %   MCV 92.3 80.0 - 100.0 fL   MCH 30.0 26.0 - 34.0 pg   MCHC 32.5 30.0 - 36.0 g/dL   RDW 13.7 11.5 - 15.5 %   Platelets  150 - 400 K/uL    PLATELET CLUMPS NOTED ON SMEAR, COUNT APPEARS DECREASED    Comment: Immature  Platelet Fraction may be clinically  indicated, consider ordering this additional test [id number]    nRBC 0.0 0.0 - 0.2 %   Neutrophils Relative % 61 %   Neutro Abs 2.9 1.7 - 7.7 K/uL   Lymphocytes Relative 25 %   Lymphs Abs 1.2 0.7 - 4.0 K/uL   Monocytes Relative 9 %   Monocytes Absolute 0.4 0.1 - 1.0 K/uL   Eosinophils Relative 4 %   Eosinophils Absolute 0.2 0.0 - 0.5 K/uL   Basophils Relative 0 %   Basophils Absolute 0.0 0.0 - 0.1 K/uL   Immature Granulocytes 1 %   Abs Immature Granulocytes 0.05 0.00 - 0.07 K/uL    Comment: Performed at [facility] [address] [place], [place] [address]  Lactic acid, plasma     Status: None   Collection Time: 10/21/19 11:05 PM  Result Value Ref Range   Lactic Acid, Venous 1.5 0.5 - 1.9 mmol/L    Comment: Performed at [facility] [address], [place], [place] [address]  Sedimentation rate     Status: None   Collection Time: 10/21/19 11:15 PM  Result Value Ref Range   Sed Rate 14 0 - 22 mm/hr    Comment: Performed at [facility] [address] [place], [place] [address]  C-reactive protein     Status: None   Collection Time: 10/21/19 11:15 PM  Result Value Ref Range   CRP <0.8 <1.0 mg/dL    Comment: Performed at [facility] [address] [place], [place] [address]  Blood culture (routine x 2)     Status: None   Collection Time: 10/21/19 11:15 PM   Specimen: BLOOD RIGHT FOREARM  Result Value Ref Range   Specimen Description BLOOD RIGHT FOREARM    Special Requests      BOTTLES DRAWN AEROBIC AND ANAEROBIC Blood Culture adequate volume   Culture      NO GROWTH 5 DAYS Performed at [facility], [place] [address], [place], [place] [address]    Report Status 10/27/2019 FINAL   SARS CORONAVIRUS 2 (TAT 6-24 HRS) Nasopharyngeal Nasopharyngeal Swab     Status: None   Collection Time: 10/21/19 11:55 PM   Specimen: Nasopharyngeal Swab  Result Value Ref Range   SARS Coronavirus 2 NEGATIVE NEGATIVE     Comment: (NOTE) SARS-CoV-2 target nucleic acids are NOT DETECTED. The SARS-CoV-2 RNA is generally detectable in upper and lower respiratory specimens during the acute phase of infection. Negative results do not preclude SARS-CoV-2 infection, do not rule out co-infections with other pathogens, and should not be used as the sole basis for treatment or other patient management decisions. Negative results must be combined with clinical observations, patient history, and epidemiological information. The expected result is Negative. Fact Sheet for Patients: SugarRoll.be Fact Sheet for Healthcare Providers: https://www.woods-mathews.com/ This test is not yet approved or cleared by the [place] FDA and  has been authorized for detection and/or diagnosis of SARS-CoV-2 by FDA under an Emergency Use Authorization (EUA). This EUA will remain  in effect (meaning this test can be used) for the duration of the COVID-19 declaration under Section 56 4(b)(1) of the Act, 21 U.S.C. section 360bbb-3(b)(1), unless the authorization is terminated or revoked sooner. Performed at [facility], [place] [address], [place], [place] [address]   HIV Antibody (routine testing w rflx)     Status: None   Collection Time: 10/22/19  3:30 AM  Result Value Ref Range   HIV Screen  4th Generation wRfx NON REACTIVE NON REACTIVE    Comment: Performed at [facility], [place] [address], [place], [place] [address]  Basic metabolic panel     Status: Abnormal   Collection Time: 10/22/19  3:30 AM  Result Value Ref Range   Sodium 136 135 - 145 mmol/L   Potassium 3.4 (L) 3.5 - 5.1 mmol/L   Chloride 102 98 - 111 mmol/L   CO2 26 22 - 32 mmol/L   Glucose, Bld 91 70 - 99 mg/dL   BUN 11 8 - 23 mg/dL   Creatinine, Ser 0.77 0.44 - 1.00 mg/dL   Calcium 8.7 (L) 8.9 - 10.3 mg/dL   GFR calc non Af Amer >60 >60 mL/min   GFR calc Af Amer >60 >60 mL/min   Anion gap 8 5 - 15    Comment:  Performed at [facility] [address], [place], [place] [address]  CBC     Status: Abnormal   Collection Time: 10/22/19  3:30 AM  Result Value Ref Range   WBC 3.8 (L) 4.0 - 10.5 K/uL   RBC 3.82 (L) 3.87 - 5.11 MIL/uL   Hemoglobin 11.5 (L) 12.0 - 15.0 g/dL   HCT 35.2 (L) 36.0 - 46.0 %   MCV 92.1 80.0 - 100.0 fL   MCH 30.1 26.0 - 34.0 pg   MCHC 32.7 30.0 - 36.0 g/dL   RDW 13.8 11.5 - 15.5 %   Platelets 101 (L) 150 - 400 K/uL    Comment: REPEATED TO VERIFY PLATELET COUNT CONFIRMED BY SMEAR SPECIMEN CHECKED FOR CLOTS Immature Platelet Fraction may be clinically indicated, consider ordering this additional test [id number]    nRBC 0.0 0.0 - 0.2 %    Comment: Performed at [facility], [place] [address] [place], [place] [address]  CBC     Status: Abnormal   Collection Time: 10/23/19  4:57 AM  Result Value Ref Range   WBC 4.0 4.0 - 10.5 K/uL   RBC 4.00 3.87 - 5.11 MIL/uL   Hemoglobin 12.0 12.0 - 15.0 g/dL   HCT 37.1 36.0 - 46.0 %   MCV 92.8 80.0 - 100.0 fL   MCH 30.0 26.0 - 34.0 pg   MCHC 32.3 30.0 - 36.0 g/dL   RDW 13.8 11.5 - 15.5 %   Platelets 93 (L) 150 - 400 K/uL    Comment: Immature Platelet Fraction may be clinically indicated, consider ordering this additional test [id number] CONSISTENT WITH PREVIOUS RESULT    nRBC 0.0 0.0 - 0.2 %    Comment: Performed at [facility], [place] [address], [place], [place] [address]  Basic metabolic panel     Status: Abnormal   Collection Time: 10/23/19  4:57 AM  Result Value Ref Range   Sodium 135 135 - 145 mmol/L   Potassium 4.3 3.5 - 5.1 mmol/L   Chloride 102 98 - 111 mmol/L   CO2 23 22 - 32 mmol/L   Glucose, Bld 82 70 - 99 mg/dL   BUN 8 8 - 23 mg/dL   Creatinine, Ser 0.57 0.44 - 1.00 mg/dL   Calcium 8.7 (L) 8.9 - 10.3 mg/dL   GFR calc non Af Amer >60 >60 mL/min   GFR calc Af Amer >60 >60 mL/min   Anion gap 10 5 - 15    Comment: Performed at [facility], [place] [address], [place], [place] [address]  CBC      Status: Abnormal   Collection  Time: 10/26/19  5:19 AM  Result Value Ref Range   WBC 5.0 4.0 - 10.5 K/uL   RBC 4.17 3.87 - 5.11 MIL/uL   Hemoglobin 12.6 12.0 - 15.0 g/dL   HCT 38.6 36.0 - 46.0 %   MCV 92.6 80.0 - 100.0 fL   MCH 30.2 26.0 - 34.0 pg   MCHC 32.6 30.0 - 36.0 g/dL   RDW 13.9 11.5 - 15.5 %   Platelets 98 (L) 150 - 400 K/uL    Comment: REPEATED TO VERIFY Immature Platelet Fraction may be clinically indicated, consider ordering this additional test [id number] CONSISTENT WITH PREVIOUS RESULT    nRBC 0.0 0.0 - 0.2 %    Comment: Performed at [facility], [place] [address] [place], [place] [address]  Basic metabolic panel     Status: Abnormal   Collection Time: 10/26/19  5:19 AM  Result Value Ref Range   Sodium 134 (L) 135 - 145 mmol/L   Potassium 4.2 3.5 - 5.1 mmol/L   Chloride 99 98 - 111 mmol/L   CO2 26 22 - 32 mmol/L   Glucose, Bld 97 70 - 99 mg/dL   BUN 10 8 - 23 mg/dL   Creatinine, Ser 0.61 0.44 - 1.00 mg/dL   Calcium 8.9 8.9 - 10.3 mg/dL   GFR calc non Af Amer >60 >60 mL/min   GFR calc Af Amer >60 >60 mL/min   Anion gap 9 5 - 15    Comment: Performed at [facility], [place] [address] [place], [place] [address]  POCT urinalysis dip (device)     Status: Abnormal   Collection Time: 11/18/19  1:44 PM  Result Value Ref Range   Glucose, UA NEGATIVE NEGATIVE mg/dL   Bilirubin Urine NEGATIVE NEGATIVE   Ketones, ur NEGATIVE NEGATIVE mg/dL   Specific Gravity, Urine 1.015 1.005 - 1.030   Hgb urine dipstick NEGATIVE NEGATIVE   pH 7.0 5.0 - 8.0   Protein, ur NEGATIVE NEGATIVE mg/dL   Urobilinogen, UA 0.2 0.0 - 1.0 mg/dL   Nitrite POSITIVE (A) NEGATIVE   Leukocytes,Ua TRACE (A) NEGATIVE    Comment: Biochemical Testing Only. Please order routine urinalysis from main lab if confirmatory testing is needed.  Urine culture     Status: Abnormal   Collection Time: 11/18/19  1:48 PM   Specimen: Urine, Clean Catch  Result Value Ref Range   Specimen Description URINE,  CLEAN CATCH    Special Requests      NONE Performed at [facility], [address], [place], [place] [address]    Culture (A)     40,000 COLONIES/mL VANCOMYCIN RESISTANT ENTEROCOCCUS ISOLATED   Report Status 11/21/2019 FINAL    Organism ID, Bacteria VANCOMYCIN RESISTANT ENTEROCOCCUS ISOLATED (A)       Susceptibility   Vancomycin resistant enterococcus isolated - MIC*    AMPICILLIN <=2 SENSITIVE Sensitive     NITROFURANTOIN 32 SENSITIVE Sensitive     VANCOMYCIN >=32 RESISTANT Resistant     LINEZOLID 2 SENSITIVE Sensitive     * 40,000 COLONIES/mL VANCOMYCIN RESISTANT ENTEROCOCCUS ISOLATED     Psychiatric Specialty Exam: Physical Exam  Review of Systems  There were no vitals taken for this visit.There is no height or weight on file to calculate BMI.  General Appearance: NA  Eye Contact:  NA  Speech:  fast but clear  Volume:  Increased  Mood:  Irritable and emotional  Affect:  NA  Thought Process:  Descriptions of Associations: Circumstantial  Orientation:  Full (Time, Place, and Person)  Thought Content:  Rumination  Suicidal Thoughts:  No  Homicidal Thoughts:  No  Memory:  Immediate;   Fair Recent;   Fair Remote;   Fair  Judgement:  Fair  Insight:  Fair  Psychomotor Activity:  NA  Concentration:  Concentration: Fair and Attention Span: Fair  Recall:  [employer] of Knowledge:  Fair  Language:  Good  Akathisia:  No  Handed:  Right  AIMS (if indicated):     Assets:  Communication Skills Desire for Improvement Housing  ADL's:  Intact  Cognition:  WNL  Sleep:   fair      Assessment and Plan: Bipolar disorder type I.  Generalized anxiety disorder.  I reviewed her blood work results and current medication.  Patient could not tolerate Depakote 1000 mg and started to have dizziness, tremors and nightmares.  Her platelet is 98.  I recommend to discontinue hydroxyzine as patient no longer taking it anyway.  Reduce Depakote to 500 mg only at bedtime.  We will  start lithium 300 mg twice a day and she will continue venlafaxine 75 mg daily.  1 more time I encourage to cut down Pamelor to 50 mg only as it may contribute to mania and higher dose.  She agreed with the plan.  We will do lithium level on her next appointment.  Her creatinine and BUN is normal.  She is no longer taking any antibiotics.  Encouraged to take pain medicine only if needed due to interaction with psychotropic medication.  Discussed safety concerns and anytime having active suicidal thoughts or homicidal thoughts continue to call [phone] one of the local emergency room.  Follow-up in 2 to 3 weeks.  Time spent 25 minutes.  Follow Up Instructions:    I discussed the assessment and treatment plan with the patient. The patient was provided an opportunity to ask questions and all were answered. The patient agreed with the plan and demonstrated an understanding of the instructions.   The patient was advised to call back or seek an in-person evaluation if the symptoms worsen or if the condition fails to improve as anticipated.  I provided 25 minutes of non-face-to-face time during this encounter.   Kathlee Nations, MD

## 2019-12-23 ENCOUNTER — Encounter: Payer: Self-pay | Admitting: Family Medicine

## 2019-12-23 ENCOUNTER — Ambulatory Visit (INDEPENDENT_AMBULATORY_CARE_PROVIDER_SITE_OTHER): Payer: Medicare Other | Admitting: Family Medicine

## 2019-12-23 ENCOUNTER — Other Ambulatory Visit: Payer: Self-pay

## 2019-12-23 ENCOUNTER — Ambulatory Visit (INDEPENDENT_AMBULATORY_CARE_PROVIDER_SITE_OTHER): Payer: Medicare Other

## 2019-12-23 VITALS — BP 130/77 | HR 88 | Temp 97.3°F | Ht 63.0 in | Wt 115.4 lb

## 2019-12-23 DIAGNOSIS — R05 Cough: Secondary | ICD-10-CM

## 2019-12-23 DIAGNOSIS — R059 Cough, unspecified: Secondary | ICD-10-CM

## 2019-12-23 DIAGNOSIS — J189 Pneumonia, unspecified organism: Secondary | ICD-10-CM | POA: Diagnosis not present

## 2019-12-23 DIAGNOSIS — R0602 Shortness of breath: Secondary | ICD-10-CM | POA: Diagnosis not present

## 2019-12-23 DIAGNOSIS — L03116 Cellulitis of left lower limb: Secondary | ICD-10-CM

## 2019-12-23 MED ORDER — AZITHROMYCIN 250 MG PO TABS: ORAL_TABLET | ORAL | 0 refills | Status: DC

## 2019-12-23 MED ORDER — DOXYCYCLINE HYCLATE 100 MG PO TABS: 100.0000 mg | ORAL_TABLET | Freq: Two times a day (BID) | ORAL | 0 refills | Status: DC

## 2019-12-23 NOTE — Progress Notes (Signed)
1/22/20215:01 PM  Catherine Kelley [birth year], 69 y.o., female [id number]  Chief Complaint  Patient presents with  . Hypertension    has a blood pressure concern    HPI:   Patient is a 69 y.o. female with past medical history significant for HTN, COPD, mood disorder, insomniawho presents today for with several concerns  Patient reports several weeks of increase productive cough, wheezing Having to use albuterol more No fever or chills No loss of taste or smell No nasal congestion, headaches, body aches, malaise  She is also worried about cellulitis in her left leg as she has had increase swelling, redness, warmth and tenderness of her left foot for past several days Trying to elevate as much as possible but not helping much She was admitted in Nov 2020 for cellulitis of R leg after a fall She denies any recent falls or injuries  Psych started her on lithium 3 days ago    BP Readings from Last 3 Encounters:  12/23/19 130/77  11/18/19 116/74  10/26/19 125/67   Lab Results  Component Value Date   CREATININE 0.61 10/26/2019   BUN 10 10/26/2019   NA 134 (L) 10/26/2019   K 4.2 10/26/2019   CL 99 10/26/2019   CO2 26 10/26/2019    Depression screen PHQ 2/9 10/18/2019 10/11/2019 09/16/2019  Decreased Interest 0 1 0  Down, Depressed, Hopeless 0 1 0  PHQ - 2 Score 0 2 0  Altered sleeping - 1 -  Tired, decreased energy - 3 -  Change in appetite - 0 -  Feeling bad or failure about yourself  - 1 -  Trouble concentrating - 1 -  Moving slowly or fidgety/restless - 0 -  Suicidal thoughts - 0 -  PHQ-9 Score - 8 -  Difficult doing work/chores - Not difficult at all -  Some recent data might be hidden    Fall Risk  12/23/2019 10/18/2019 10/11/2019 09/16/2019 09/05/2019  Falls in the past year? 0 0 1 1 1   Comment - - - - -  Number falls in past yr: 0 0 1 0 0  Injury with Fall? - 0 1 0 1  Comment - - injured the Rt knee - -  Risk for fall due to : Mental status  change;Medication side effect - - Impaired mobility -  Follow up - - - - Falls evaluation completed     Allergies  Allergen Reactions  . Sulfa Drugs Cross Reactors Hives and Rash  . Ceclor [Cefaclor] Hives    Tolerated ceftazidime December 2016  . Depakote Er [Divalproex Sodium Er] Other (See Comments)    Patient remarked that when she was taking this TWICE a day, she became "clumsy" and felt more prone to stumbling  . Escitalopram Oxalate Other (See Comments)    Pt does not recall ever taking medication (??)  . Latex Itching  . Sertraline Hcl Other (See Comments)    Severe headaches   . Tape Itching and Rash    Band-Aids, also (PAPER TAPE IS TOLERATED)    Prior to Admission medications   Medication Sig Start Date End Date Taking? Authorizing Provider  albuterol (PROVENTIL) (2.5 MG/3ML) 0.083% nebulizer solution Take 2.5 mg by nebulization every 6 (six) hours as needed for wheezing or shortness of breath.   Yes [provider]  albuterol (VENTOLIN HFA) 108 (90 Base) MCG/ACT inhaler TAKE 2 PUFFS BY MOUTH EVERY 6 HOURS AS NEEDED FOR WHEEZE OR SHORTNESS OF BREATH 12/19/19  Yes Orland Mustard,  Richard, NP  amLODipine (NORVASC) 2.5 MG tablet Take 1 tablet (2.5 mg total) by mouth daily. 07/29/19  Yes Rutherford Guys, MD  aspirin EC 81 MG tablet Take 81 mg by mouth daily as needed.    Yes [provider]  budesonide-formoterol (SYMBICORT) 160-4.5 MCG/ACT inhaler Inhale 2 puffs into the lungs 2 (two) times daily. 09/19/17  Yes Patrecia Pour, Christean Grief, MD  calcium carbonate (TUMS EX) 750 MG chewable tablet Chew 1 tablet by mouth 2 (two) times daily as needed for heartburn.   Yes [provider]  divalproex (DEPAKOTE ER) 250 MG 24 hr tablet  11/22/19  Yes [provider]  divalproex (DEPAKOTE ER) 500 MG 24 hr tablet Take 2 tablets (1,000 mg total) by mouth at bedtime. Patient taking differently: Take 500 mg by mouth at bedtime.  11/04/19  Yes Arfeen, Arlyce Harman, MD    fluticasone (FLONASE) 50 MCG/ACT nasal spray Place 2 sprays into both nostrils daily. 07/15/19  Yes Maximiano Coss, NP  HYDROcodone-acetaminophen (NORCO/VICODIN) 5-325 MG tablet Take 1-2 tablets by mouth every 6 (six) hours as needed for up to 30 doses for moderate pain. 11/11/19  Yes Rutherford Guys, MD  lithium carbonate 300 MG capsule Take 1 capsule (300 mg total) by mouth 2 (two) times daily with a meal. 12/20/19 12/19/20 Yes Arfeen, Arlyce Harman, MD  methocarbamol (ROBAXIN) 500 MG tablet Take 1 tablet (500 mg total) by mouth every 8 (eight) hours as needed for muscle spasms. 07/29/19  Yes Rutherford Guys, MD  nortriptyline (PAMELOR) 50 MG capsule Take 1 capsule (50 mg total) by mouth at bedtime. 12/20/19  Yes Arfeen, Arlyce Harman, MD  PRESCRIPTION MEDICATION High-Frequency Chest Wall Oscillation (the Vest):   Yes [provider]  [facility] HANDIHALER 18 MCG inhalation capsule  11/22/19  Yes [provider]  spironolactone (ALDACTONE) 50 MG tablet Take 1 tablet (50 mg total) by mouth daily. Office visit needed for refills 10/31/19  Yes Rutherford Guys, MD  triamcinolone cream (KENALOG) 0.1 % Apply 1 application topically 2 (two) times daily. 10/18/19  Yes Wendall Mola, NP  venlafaxine (EFFEXOR) 75 MG tablet Take 1 tablet (75 mg total) by mouth daily. 11/04/19 11/03/20 Yes Arfeen, Arlyce Harman, MD  Vitamin D, Ergocalciferol, (DRISDOL) 1.25 MG (50000 UT) CAPS capsule Take 1 capsule (50,000 Units total) by mouth every 7 (seven) days. Patient taking differently: Take 50,000 Units by mouth every Tuesday.  10/14/19  Yes Wendall Mola, NP    Past Medical History:  Diagnosis Date  . Anxiety   . Arthritis    "jooints" (09/18/2017)  . Asthma   . Bipolar disorder ([place])   . Bipolar I disorder ([place]) 10/11/2019  . CAP (community acquired pneumonia) 09/18/2017  . Cat allergies   . Chronic bronchitis ([place])    "get it alot; maybe not q yr" (07/06/2014)  . Chronic lower back pain    "spurs and  pinched nerves" (09/18/2017)  . COPD (chronic obstructive pulmonary disease) ([place])   . DDD (degenerative disc disease), lumbar   . Depression    psychiatrist Dr. Toy Care  . Diastolic dysfunction   . Emphysematous COPD ([place])    changes in right base along with patchy areas on last x-ray  . Fall as cause of accidental injury at home as place of occurrence 10/11/2019  . Fall involving wheelchair as cause of accidental injury 10/11/2019  . Falls frequently    "several in 2017; fell at dr's office yesterday" (09/18/2017)  . GERD (  gastroesophageal reflux disease)   . HCAP (healthcare-associated pneumonia) 02/12/2016  . Heart murmur   . History of cardiovascular stress test 08/15/2004   EF of 70% -- Normal stress cardiolite.  There is no evidence of ischemia and there is normal LV function. -- [name]. MD  . History of echocardiogram 12/24/2006   Est. EF of 85-27% NORMAL LV SYSTOLIC FUNCTION WITH IMPAIRED RELAXATION -- MILD AORTIC SCLEROSIS -- NORMAL PALONARY ARTERY PRESSURE -- NO OLD ECHOS FOR COMPARISON -- [name], MD  . Hypertension    essential hypertension  . Migraine    "when I was having my periods; none since then" (09/18/2017)  . Necrotic pneumonia ([place])    recurrent/notes 02/12/2016  . Pneumonia    "several times since May 2015" (07/06/2014)  . Pollen allergies   . Tobacco abuse    smoking about 1/2 pk of cigarettes a day  . Tobacco dependence due to cigarettes 10/11/2019  . Traumatic hematoma of knee, right, initial encounter 10/11/2019    Past Surgical History:  Procedure Laterality Date  . CAROTID STENT    . DILATION AND CURETTAGE OF UTERUS    . TUBAL LIGATION  1986    Social History   Tobacco Use  . Smoking status: Current Every Day Smoker    Packs/day: 0.50    Years: 46.00    Pack years: 23.00    Types: Cigarettes  . Smokeless tobacco: Never Used  Substance Use Topics  . Alcohol use: Yes    Alcohol/week: 0.0 standard drinks    Comment:  09/18/2017 "might have 1 drink/month; if that"    Family History  Problem Relation Age of Onset  . Stroke Mother   . Heart disease Father   . Hyperlipidemia Father   . Hypertension Father   . Breast cancer Maternal Aunt   . Asthma Maternal Grandmother     ROS Per hpi  OBJECTIVE:  Today's Vitals   12/23/19 1650  BP: 130/77  Pulse: 88  Temp: (!) 97.3 F (36.3 C)  Weight: 115 lb 6.4 oz (52.3 kg)  Height: 5\' 3"  (1.6 m)   Body mass index is 20.44 kg/m.   Physical Exam Vitals and nursing note reviewed.  Constitutional:      Appearance: She is well-developed.  HENT:     Head: Normocephalic and atraumatic.     Mouth/Throat:     Pharynx: No oropharyngeal exudate.  Eyes:     General: No scleral icterus.    Conjunctiva/sclera: Conjunctivae normal.     Pupils: Pupils are equal, round, and reactive to light.  Cardiovascular:     Rate and Rhythm: Normal rate and regular rhythm.     Pulses: Normal pulses.     Heart sounds: Normal heart sounds. No murmur. No friction rub. No gallop.   Pulmonary:     Effort: Pulmonary effort is normal.     Breath sounds: Wheezing and rales (RLL) present. No rhonchi.  Musculoskeletal:     Cervical back: Neck supple.     Right lower leg: Edema present.     Left lower leg: Edema (pedal edema with erythema, warmth and tenderness, varicose veins present.) present.  Skin:    General: Skin is warm and dry.  Neurological:     Mental Status: She is alert and oriented to person, place, and time.       No results found for this or any previous visit (from the past 24 hour(s)).  DG Chest 2 View  Result Date: 12/23/2019  CLINICAL DATA:  Smoker wheezing short of breath EXAM: CHEST - 2 VIEW COMPARISON:  10/11/2019, 12/22/2018 FINDINGS: Bronchiectasis in the right lower lobe with interval nodular opacifications. Mild nodularity in the periphery of the left upper lobe. Normal heart size. No pneumothorax. IMPRESSION: 1. Bronchiectasis and scarring  within the right lower lobe. Interval nodular airspace opacities in the right lower lobe possibly due to mucous plugging or acute superimposed infection. Mild nodularity in the periphery of the left upper lobe, also possibly due to respiratory infection, to include atypical pneumonia. Electronically Signed   By: Donavan Foil M.D.   On: 12/23/2019 17:33     ASSESSMENT and PLAN  1. Community acquired pneumonia, unspecified laterality 2. Cellulitis of left lower extremity Clinically stable. Given atypical PNA appereance on CXR and presence of cellulitis, rx for doxy chosen. Discussed supportive measures. RTC precautions given. Mindful of lithium.  3. Cough - DG Chest 2 View; Future  Other orders - doxycycline (VIBRA-TABS) 100 MG tablet; Take 1 tablet (100 mg total) by mouth 2 (two) times daily.  Return in about 2 weeks (around 01/06/2020).  Rutherford Guys, MD Primary Care at [place], [place] [address] Ph.  [phone] Fax [phone]

## 2019-12-23 NOTE — Patient Instructions (Signed)
° ° ° °  If you have lab work done today you will be contacted with your lab results within the next 2 weeks.  If you have not heard from us then please contact us. The fastest way to get your results is to register for My Chart. ° ° °IF you received an x-ray today, you will receive an invoice from [facility]. Please contact [facility] at [phone] with questions or concerns regarding your invoice.  ° °IF you received labwork today, you will receive an invoice from [facility]. Please contact [facility] at [phone] with questions or concerns regarding your invoice.  ° °Our billing staff will not be able to assist you with questions regarding bills from these companies. ° °You will be contacted with the lab results as soon as they are available. The fastest way to get your results is to activate your My Chart account. Instructions are located on the last page of this paperwork. If you have not heard from us regarding the results in 2 weeks, please contact this office. °  ° ° ° °

## 2019-12-27 NOTE — Progress Notes (Signed)
Appt. Has been changed

## 2020-01-06 ENCOUNTER — Encounter: Payer: Self-pay | Admitting: Family Medicine

## 2020-01-06 ENCOUNTER — Ambulatory Visit (INDEPENDENT_AMBULATORY_CARE_PROVIDER_SITE_OTHER): Payer: Medicare Other | Admitting: Family Medicine

## 2020-01-06 ENCOUNTER — Ambulatory Visit
Admission: RE | Admit: 2020-01-06 | Discharge: 2020-01-06 | Disposition: A | Discharge status: Home / Self Care | Payer: Medicare Other | Source: Ambulatory Visit | Attending: Adult Health Nurse Practitioner | Admitting: Adult Health Nurse Practitioner

## 2020-01-06 ENCOUNTER — Other Ambulatory Visit: Payer: Self-pay

## 2020-01-06 VITALS — BP 134/79 | HR 92 | Temp 98.0°F | Ht 63.0 in | Wt 113.6 lb

## 2020-01-06 DIAGNOSIS — J439 Emphysema, unspecified: Secondary | ICD-10-CM

## 2020-01-06 DIAGNOSIS — F319 Bipolar disorder, unspecified: Secondary | ICD-10-CM

## 2020-01-06 DIAGNOSIS — L03116 Cellulitis of left lower limb: Secondary | ICD-10-CM

## 2020-01-06 DIAGNOSIS — Z1231 Encounter for screening mammogram for malignant neoplasm of breast: Secondary | ICD-10-CM

## 2020-01-06 DIAGNOSIS — J189 Pneumonia, unspecified organism: Secondary | ICD-10-CM

## 2020-01-06 DIAGNOSIS — Z87891 Personal history of nicotine dependence: Secondary | ICD-10-CM

## 2020-01-06 DIAGNOSIS — Z780 Asymptomatic menopausal state: Secondary | ICD-10-CM | POA: Diagnosis not present

## 2020-01-06 DIAGNOSIS — M8588 Other specified disorders of bone density and structure, other site: Secondary | ICD-10-CM

## 2020-01-06 DIAGNOSIS — R350 Frequency of micturition: Secondary | ICD-10-CM | POA: Diagnosis not present

## 2020-01-06 DIAGNOSIS — Z23 Encounter for immunization: Secondary | ICD-10-CM

## 2020-01-06 DIAGNOSIS — I119 Hypertensive heart disease without heart failure: Secondary | ICD-10-CM

## 2020-01-06 DIAGNOSIS — D696 Thrombocytopenia, unspecified: Secondary | ICD-10-CM

## 2020-01-06 DIAGNOSIS — M810 Age-related osteoporosis without current pathological fracture: Secondary | ICD-10-CM | POA: Diagnosis not present

## 2020-01-06 DIAGNOSIS — Z720 Tobacco use: Secondary | ICD-10-CM

## 2020-01-06 LAB — POCT URINALYSIS DIP (MANUAL ENTRY)
Bilirubin, UA: NEGATIVE
Blood, UA: NEGATIVE
Glucose, UA: NEGATIVE mg/dL
Ketones, POC UA: NEGATIVE mg/dL
Nitrite, UA: NEGATIVE
Protein Ur, POC: NEGATIVE mg/dL
Spec Grav, UA: 1.02 (ref 1.010–1.025)
Urobilinogen, UA: 0.2 E.U./dL
pH, UA: 6 (ref 5.0–8.0)

## 2020-01-06 MED ORDER — ALBUTEROL SULFATE HFA 108 (90 BASE) MCG/ACT IN AERS: 2.0000 | INHALATION_SPRAY | Freq: Four times a day (QID) | RESPIRATORY_TRACT | 11 refills | Status: DC | PRN

## 2020-01-06 NOTE — Patient Instructions (Signed)
° ° ° °  If you have lab work done today you will be contacted with your lab results within the next 2 weeks.  If you have not heard from us then please contact us. The fastest way to get your results is to register for My Chart. ° ° °IF you received an x-ray today, you will receive an invoice from [facility]. Please contact [facility] at [phone] with questions or concerns regarding your invoice.  ° °IF you received labwork today, you will receive an invoice from [facility]. Please contact [facility] at [phone] with questions or concerns regarding your invoice.  ° °Our billing staff will not be able to assist you with questions regarding bills from these companies. ° °You will be contacted with the lab results as soon as they are available. The fastest way to get your results is to activate your My Chart account. Instructions are located on the last page of this paperwork. If you have not heard from us regarding the results in 2 weeks, please contact this office. °  ° ° ° °

## 2020-01-06 NOTE — Progress Notes (Signed)
2/5/20212:52 PM  Catherine Kelley August 29, 1951, 69 y.o., female [id number]  Chief Complaint  Patient presents with  . Cellulitis  . Polyuria    HPI:   Patient is a 69 y.o. female with past medical history significant for HTN, COPD, mood disorder, insomnia who presents today for followup  Last OV 2 weeks - doxy for RLE cellulitis and possible CAP  Completed doxy Left leg sign better, no more swelling, redness or warmth Using compression stockings Breathing back to normal Needs ventolin changed to proair due to insurance coverage Having return of urinary frequency, no dysuria, no hematuria, using azo, no fever or chills Feels that her mood is much improved since she started lithium She has no acute concerns today  Depression screen [facility] 2/9 10/18/2019 10/11/2019 09/16/2019  Decreased Interest 0 1 0  Down, Depressed, Hopeless 0 1 0  PHQ - 2 Score 0 2 0  Altered sleeping - 1 -  Tired, decreased energy - 3 -  Change in appetite - 0 -  Feeling bad or failure about yourself  - 1 -  Trouble concentrating - 1 -  Moving slowly or fidgety/restless - 0 -  Suicidal thoughts - 0 -  PHQ-9 Score - 8 -  Difficult doing work/chores - Not difficult at all -  Some recent data might be hidden    Fall Risk  01/06/2020 12/23/2019 10/18/2019 10/11/2019 09/16/2019  Falls in the past year? 0 0 0 1 1  Comment - - - - -  Number falls in past yr: 0 0 0 1 0  Injury with Fall? 0 - 0 1 0  Comment - - - injured the Rt knee -  Risk for fall due to : - Mental status change;Medication side effect - - Impaired mobility  Follow up - - - - -     Allergies  Allergen Reactions  . Sulfa Drugs Cross Reactors Hives and Rash  . Ceclor [Cefaclor] Hives    Tolerated ceftazidime December 2016  . Depakote Er [Divalproex Sodium Er] Other (See Comments)    Patient remarked that when she was taking this TWICE a day, she became "clumsy" and felt more prone to stumbling  . Escitalopram Oxalate Other (See Comments)   Pt does not recall ever taking medication (??)  . Latex Itching  . Sertraline Hcl Other (See Comments)    Severe headaches   . Tape Itching and Rash    Band-Aids, also (PAPER TAPE IS TOLERATED)    Prior to Admission medications   Medication Sig Start Date End Date Taking? Authorizing Provider  albuterol (PROVENTIL) (2.5 MG/3ML) 0.083% nebulizer solution Take 2.5 mg by nebulization every 6 (six) hours as needed for wheezing or shortness of breath.   Yes [provider]  albuterol (VENTOLIN HFA) 108 (90 Base) MCG/ACT inhaler TAKE 2 PUFFS BY MOUTH EVERY 6 HOURS AS NEEDED FOR WHEEZE OR SHORTNESS OF BREATH 12/19/19  Yes Maximiano Coss, NP  amLODipine (NORVASC) 2.5 MG tablet Take 1 tablet (2.5 mg total) by mouth daily. 07/29/19  Yes Rutherford Guys, MD  aspirin EC 81 MG tablet Take 81 mg by mouth daily as needed.    Yes [provider]  budesonide-formoterol (SYMBICORT) 160-4.5 MCG/ACT inhaler Inhale 2 puffs into the lungs 2 (two) times daily. 09/19/17  Yes Patrecia Pour, Christean Grief, MD  calcium carbonate (TUMS EX) 750 MG chewable tablet Chew 1 tablet by mouth 2 (two) times daily as needed for heartburn.   Yes [provider]  divalproex (DEPAKOTE ER) 250 MG 24 hr tablet  11/22/19  Yes [provider]  divalproex (DEPAKOTE ER) 500 MG 24 hr tablet Take 2 tablets (1,000 mg total) by mouth at bedtime. Patient taking differently: Take 500 mg by mouth at bedtime.  11/04/19  Yes Arfeen, Arlyce Harman, MD  doxycycline (VIBRA-TABS) 100 MG tablet Take 1 tablet (100 mg total) by mouth 2 (two) times daily. 12/23/19  Yes Rutherford Guys, MD  fluticasone [facility]) 50 MCG/ACT nasal spray Place 2 sprays into both nostrils daily. 07/15/19  Yes Maximiano Coss, NP  HYDROcodone-acetaminophen (NORCO/VICODIN) 5-325 MG tablet Take 1-2 tablets by mouth every 6 (six) hours as needed for up to 30 doses for moderate pain. 11/11/19  Yes Rutherford Guys, MD  lithium carbonate 300 MG capsule Take 1  capsule (300 mg total) by mouth 2 (two) times daily with a meal. 12/20/19 12/19/20 Yes Arfeen, Arlyce Harman, MD  methocarbamol (ROBAXIN) 500 MG tablet Take 1 tablet (500 mg total) by mouth every 8 (eight) hours as needed for muscle spasms. 07/29/19  Yes Rutherford Guys, MD  nortriptyline (PAMELOR) 50 MG capsule Take 1 capsule (50 mg total) by mouth at bedtime. 12/20/19  Yes Arfeen, Arlyce Harman, MD  PRESCRIPTION MEDICATION High-Frequency Chest Wall Oscillation (the Vest):   Yes [provider]  [facility] HANDIHALER 18 MCG inhalation capsule  11/22/19  Yes [provider]  spironolactone (ALDACTONE) 50 MG tablet Take 1 tablet (50 mg total) by mouth daily. Office visit needed for refills 10/31/19  Yes Rutherford Guys, MD  triamcinolone cream (KENALOG) 0.1 % Apply 1 application topically 2 (two) times daily. 10/18/19  Yes Wendall Mola, NP  venlafaxine (EFFEXOR) 75 MG tablet Take 1 tablet (75 mg total) by mouth daily. 11/04/19 11/03/20 Yes Arfeen, Arlyce Harman, MD  Vitamin D, Ergocalciferol, (DRISDOL) 1.25 MG (50000 UT) CAPS capsule Take 1 capsule (50,000 Units total) by mouth every 7 (seven) days. Patient taking differently: Take 50,000 Units by mouth every Tuesday.  10/14/19  Yes Wendall Mola, NP    Past Medical History:  Diagnosis Date  . Anxiety   . Arthritis    "jooints" (09/18/2017)  . Asthma   . Bipolar disorder ([place])   . Bipolar I disorder ([place]) 10/11/2019  . CAP (community acquired pneumonia) 09/18/2017  . Cat allergies   . Chronic bronchitis ([place])    "get it alot; maybe not q yr" (07/06/2014)  . Chronic lower back pain    "spurs and pinched nerves" (09/18/2017)  . COPD (chronic obstructive pulmonary disease) ([place])   . DDD (degenerative disc disease), lumbar   . Depression    psychiatrist Dr. Toy Care  . Diastolic dysfunction   . Emphysematous COPD ([place])    changes in right base along with patchy areas on last x-ray  . Fall as cause of accidental injury at home as place of  occurrence 10/11/2019  . Fall involving wheelchair as cause of accidental injury 10/11/2019  . Falls frequently    "several in 2017; fell at dr's office yesterday" (09/18/2017)  . GERD (gastroesophageal reflux disease)   . HCAP (healthcare-associated pneumonia) 02/12/2016  . Heart murmur   . History of cardiovascular stress test 08/15/2004   EF of 70% -- Normal stress cardiolite.  There is no evidence of ischemia and there is normal LV function. -- [name]. MD  . History of echocardiogram 12/24/2006   Est. EF of 10-07% NORMAL LV SYSTOLIC FUNCTION WITH IMPAIRED RELAXATION -- MILD AORTIC SCLEROSIS --  NORMAL PALONARY ARTERY PRESSURE -- NO OLD ECHOS FOR COMPARISON -- [name], MD  . Hypertension    essential hypertension  . Migraine    "when I was having my periods; none since then" (09/18/2017)  . Necrotic pneumonia ([place])    recurrent/notes 02/12/2016  . Pneumonia    "several times since May 2015" (07/06/2014)  . Pollen allergies   . Tobacco abuse    smoking about 1/2 pk of cigarettes a day  . Tobacco dependence due to cigarettes 10/11/2019  . Traumatic hematoma of knee, right, initial encounter 10/11/2019    Past Surgical History:  Procedure Laterality Date  . CAROTID STENT    . DILATION AND CURETTAGE OF UTERUS    . TUBAL LIGATION  1986    Social History   Tobacco Use  . Smoking status: Current Every Day Smoker    Packs/day: 0.50    Years: 46.00    Pack years: 23.00    Types: Cigarettes  . Smokeless tobacco: Never Used  Substance Use Topics  . Alcohol use: Yes    Alcohol/week: 0.0 standard drinks    Comment: 09/18/2017 "might have 1 drink/month; if that"    Family History  Problem Relation Age of Onset  . Stroke Mother   . Heart disease Father   . Hyperlipidemia Father   . Hypertension Father   . Breast cancer Maternal Aunt   . Asthma Maternal Grandmother     Review of Systems  Constitutional: Negative for chills and fever.  Respiratory:  Negative for cough and shortness of breath.   Cardiovascular: Negative for chest pain, palpitations and leg swelling.  Gastrointestinal: Negative for abdominal pain, nausea and vomiting.  per hpi   OBJECTIVE:  Today's Vitals   01/06/20 1443  BP: 134/79  Pulse: 92  Temp: 98 F (36.7 C)  SpO2: 92%  Weight: 113 lb 9.6 oz (51.5 kg)  Height: 5\' 3"  (1.6 m)   Body mass index is 20.12 kg/m.   Physical Exam Vitals and nursing note reviewed.  Constitutional:      Appearance: She is well-developed.  HENT:     Head: Normocephalic and atraumatic.     Mouth/Throat:     Pharynx: No oropharyngeal exudate.  Eyes:     General: No scleral icterus.    Conjunctiva/sclera: Conjunctivae normal.     Pupils: Pupils are equal, round, and reactive to light.  Cardiovascular:     Rate and Rhythm: Normal rate and regular rhythm.     Heart sounds: Normal heart sounds. No murmur. No friction rub. No gallop.   Pulmonary:     Effort: Pulmonary effort is normal.     Breath sounds: Normal breath sounds. No wheezing, rhonchi or rales.  Musculoskeletal:     Cervical back: Neck supple.     Right lower leg: No edema.     Left lower leg: No edema.     Comments: Bilateral varicose veins  Lymphadenopathy:     Cervical: No cervical adenopathy.  Skin:    General: Skin is warm and dry.  Neurological:     Mental Status: She is alert and oriented to person, place, and time.     Results for orders placed or performed in visit on 01/06/20 (from the past 24 hour(s))  POCT urinalysis dipstick     Status: Abnormal   Collection Time: 01/06/20  2:51 PM  Result Value Ref Range   Color, UA yellow yellow   Clarity, UA clear clear   Glucose, UA negative  negative mg/dL   Bilirubin, UA negative negative   Ketones, POC UA negative negative mg/dL   Spec Grav, UA 1.020 1.010 - 1.025   Blood, UA negative negative   pH, UA 6.0 5.0 - 8.0   Protein Ur, POC negative negative mg/dL   Urobilinogen, UA 0.2 0.2 or 1.0  E.U./dL   Nitrite, UA Negative Negative   Leukocytes, UA Small (1+) (A) Negative       ASSESSMENT and PLAN  1. Urinary frequency UTI unlikely. Further action pending cx results - POCT urinalysis dipstick - Urine Culture  2. Cellulitis of left lower extremity Resolved. Cont with use of compression stockings  3. Community acquired pneumonia, unspecified laterality Resolved. rx for proair given  4. Benign hypertensive heart disease without heart failure Controlled. Continue current regime.   Other orders - albuterol (VENTOLIN HFA) 108 (90 Base) MCG/ACT inhaler; Inhale 2 puffs into the lungs every 6 (six) hours as needed for wheezing or shortness of breath.  Return in about 6 months (around 07/05/2020).    Rutherford Guys, MD Primary Care at [place], [place] [address] Ph.  [phone] Fax [phone]

## 2020-01-10 LAB — URINE CULTURE

## 2020-01-12 ENCOUNTER — Other Ambulatory Visit (HOSPITAL_COMMUNITY): Payer: Self-pay | Admitting: Psychiatry

## 2020-01-12 DIAGNOSIS — F319 Bipolar disorder, unspecified: Secondary | ICD-10-CM

## 2020-01-13 ENCOUNTER — Encounter: Payer: Self-pay | Admitting: Adult Health Nurse Practitioner

## 2020-01-13 DIAGNOSIS — M818 Other osteoporosis without current pathological fracture: Secondary | ICD-10-CM

## 2020-01-13 MED ORDER — ALENDRONATE SODIUM 70 MG PO TABS: 70.0000 mg | ORAL_TABLET | ORAL | 11 refills | Status: DC

## 2020-01-17 ENCOUNTER — Telehealth (HOSPITAL_COMMUNITY): Payer: Self-pay | Admitting: *Deleted

## 2020-01-17 ENCOUNTER — Ambulatory Visit (INDEPENDENT_AMBULATORY_CARE_PROVIDER_SITE_OTHER): Payer: Medicare Other | Admitting: Psychiatry

## 2020-01-17 ENCOUNTER — Encounter (HOSPITAL_COMMUNITY): Payer: Self-pay | Admitting: Psychiatry

## 2020-01-17 ENCOUNTER — Other Ambulatory Visit: Payer: Self-pay

## 2020-01-17 ENCOUNTER — Other Ambulatory Visit: Payer: Self-pay | Admitting: Family Medicine

## 2020-01-17 DIAGNOSIS — Z79899 Other long term (current) drug therapy: Secondary | ICD-10-CM

## 2020-01-17 DIAGNOSIS — F319 Bipolar disorder, unspecified: Secondary | ICD-10-CM

## 2020-01-17 DIAGNOSIS — F411 Generalized anxiety disorder: Secondary | ICD-10-CM

## 2020-01-17 MED ORDER — LITHIUM CARBONATE 300 MG PO CAPS: 300.0000 mg | ORAL_CAPSULE | Freq: Two times a day (BID) | ORAL | 1 refills | Status: DC

## 2020-01-17 MED ORDER — VENLAFAXINE HCL 75 MG PO TABS: 75.0000 mg | ORAL_TABLET | Freq: Every day | ORAL | 1 refills | Status: DC

## 2020-01-17 MED ORDER — NORTRIPTYLINE HCL 50 MG PO CAPS: 50.0000 mg | ORAL_CAPSULE | Freq: Every day | ORAL | 1 refills | Status: DC

## 2020-01-17 MED ORDER — DIVALPROEX SODIUM ER 500 MG PO TB24: 500.0000 mg | ORAL_TABLET | Freq: Every day | ORAL | 1 refills | Status: DC

## 2020-01-17 NOTE — Telephone Encounter (Signed)
Writer faxed over orders for Valproic acid and Lithium levels to Dr. Grant Fontana @ [facility] per Dr. Adele Schilder. Transmission completed.

## 2020-01-17 NOTE — Telephone Encounter (Signed)
Please look for these orders as it seems that patient is planning on coming here to have them drawn. Ok to put them under my name with CC Dr Adele Schilder. Thanks

## 2020-01-17 NOTE — Progress Notes (Signed)
Virtual Visit via Telephone Note  I connected with Catherine Kelley on 01/17/20 at  1:40 PM EST by telephone and verified that I am speaking with the correct person using two identifiers.   I discussed the limitations, risks, security and privacy concerns of performing an evaluation and management service by telephone and the availability of in person appointments. I also discussed with the patient that there may be a patient responsible charge related to this service. The patient expressed understanding and agreed to proceed.   History of Present Illness: Patient was evaluated by phone session.  On the last visit we added lithium as patient continued to have mania, irritability and highs and lows.  Her thought process was circumstantial.  Initially we tried Depakote but could not tolerate and she is only taking 500 mg Depakote.  She reported her sleep is improved when she is able to get 7 hours of sleep.  She has multiple UTI but she reported her symptoms are much better.  She denies any hallucination, paranoia.  She is tolerating lithium without any side effects.  She has no tremors or nausea.  Her appetite is okay.  She lives with her husband.  She denies drinking or using any illegal substances.   Psychiatric Specialty Exam: Physical Exam  Review of Systems  There were no vitals taken for this visit.There is no height or weight on file to calculate BMI.  General Appearance: NA  Eye Contact:  NA  Speech:  Normal Rate  Volume:  Normal  Mood:  Euthymic  Affect:  NA  Thought Process:  Descriptions of Associations: Intact  Orientation:  Full (Time, Place, and Person)  Thought Content:  Rumination  Suicidal Thoughts:  No  Homicidal Thoughts:  No  Memory:  Immediate;   Fair Recent;   Good Remote;   Good  Judgement:  Intact  Insight:  Present  Psychomotor Activity:  NA  Concentration:  Concentration: Fair and Attention Span: Fair  Recall:  Good  Fund of Knowledge:  Good  Language:  Good   Akathisia:  No  Handed:  Right  AIMS (if indicated):     Assets:  Communication Skills Desire for Improvement Housing Resilience Social Support  ADL's:  Intact  Cognition:  WNL  Sleep:   7 hrs      Assessment and Plan: Bipolar disorder type I.  Generalized anxiety disorder.  Patient reported improvement with the lithium.  She is not as manic and sleeping better.  She denies any highs and lows.  We have cut down her Depakote and Pamelor.  She like to continue lithium which is helping her mood.  Continue lithium 300 mg twice a day, venlafaxine 75 mg daily, Pamelor 50 mg at bedtime and she will continue Depakote 500 mg at bedtime.  Patient has upcoming appointment with her PCP.  I recommend to have blood work lithium and Depakote level.  I have ordered blood work in [facility].  I recommended to call us back if she has any question or any concern.  Follow-up in 2 months.  We discussed repeated UTI may need to monitor her kidney function test as lithium may cause significant side effects including compromising kidney function test.  Currently she feels lithium is working and tolerating very well and she like to continue it.  Follow Up Instructions:    I discussed the assessment and treatment plan with the patient. The patient was provided an opportunity to ask questions and all were answered. The patient agreed  with the plan and demonstrated an understanding of the instructions.   The patient was advised to call back or seek an in-person evaluation if the symptoms worsen or if the condition fails to improve as anticipated.  I provided 15 minutes of non-face-to-face time during this encounter.   Kathlee Nations, MD

## 2020-01-18 ENCOUNTER — Other Ambulatory Visit: Payer: Self-pay

## 2020-01-27 ENCOUNTER — Ambulatory Visit: Payer: Medicare Other | Admitting: Family Medicine

## 2020-02-06 ENCOUNTER — Other Ambulatory Visit: Payer: Self-pay | Admitting: Family Medicine

## 2020-02-06 NOTE — Telephone Encounter (Signed)
Requested medication (s) are due for refill today  Yes.  Last ordered on 07/29/19  Requested medication (s) are on the active medication list  Yes  Future visit scheduled  Yes on 07/06/20.  Note to clinic: This medication is non-delegated. Routing to provider for further consideration.   Requested Prescriptions  Pending Prescriptions Disp Refills   methocarbamol (ROBAXIN) 500 MG tablet [Pharmacy Med Name: METHOCARBAMOL 500 MG TABLET] 30 tablet 1    Sig: Take 1 tablet (500 mg total) by mouth every 8 (eight) hours as needed for muscle spasms.      Not Delegated - Analgesics:  Muscle Relaxants Failed - 02/06/2020  3:35 PM      Failed - This refill cannot be delegated      Passed - Valid encounter within last 6 months    Recent Outpatient Visits           1 month ago Urinary frequency   [facility], Lilia Argue, MD   1 month ago Community acquired pneumonia, unspecified laterality   [facility], Lilia Argue, MD   2 months ago    [facility], Lilia Argue, MD   3 months ago Cellulitis of right lower extremity   [facility], Lorelee Market, NP   3 months ago Acute upper respiratory infection   [facility] at [facility], Lorelee Market, NP       Future Appointments             In 5 months Pamella Pert, Lilia Argue, MD [facility] at [place], [facility]

## 2020-02-06 NOTE — Telephone Encounter (Signed)
Patient is requesting a refill of the following medications: Requested Prescriptions   Pending Prescriptions Disp Refills  . methocarbamol (ROBAXIN) 500 MG tablet [Pharmacy Med Name: METHOCARBAMOL 500 MG TABLET] 30 tablet 1    Sig: TAKE 1 TABLET (500 MG TOTAL) BY MOUTH EVERY 8 (EIGHT) HOURS AS NEEDED FOR MUSCLE SPASMS.     Date of last refill: 07/29/19 Follow up time period per chart: 07/06/20

## 2020-02-08 ENCOUNTER — Other Ambulatory Visit (HOSPITAL_COMMUNITY): Payer: Self-pay | Admitting: Psychiatry

## 2020-02-08 DIAGNOSIS — F319 Bipolar disorder, unspecified: Secondary | ICD-10-CM

## 2020-02-10 DIAGNOSIS — Z23 Encounter for immunization: Secondary | ICD-10-CM | POA: Diagnosis not present

## 2020-02-16 NOTE — Telephone Encounter (Signed)
complete

## 2020-03-09 ENCOUNTER — Other Ambulatory Visit (HOSPITAL_COMMUNITY): Payer: Self-pay | Admitting: Psychiatry

## 2020-03-09 DIAGNOSIS — F319 Bipolar disorder, unspecified: Secondary | ICD-10-CM

## 2020-03-09 DIAGNOSIS — Z23 Encounter for immunization: Secondary | ICD-10-CM | POA: Diagnosis not present

## 2020-03-15 ENCOUNTER — Other Ambulatory Visit: Payer: Self-pay

## 2020-03-15 ENCOUNTER — Encounter (HOSPITAL_COMMUNITY): Payer: Self-pay | Admitting: Psychiatry

## 2020-03-15 ENCOUNTER — Ambulatory Visit (INDEPENDENT_AMBULATORY_CARE_PROVIDER_SITE_OTHER): Payer: Medicare Other | Admitting: Psychiatry

## 2020-03-15 DIAGNOSIS — F319 Bipolar disorder, unspecified: Secondary | ICD-10-CM | POA: Diagnosis not present

## 2020-03-15 DIAGNOSIS — F17210 Nicotine dependence, cigarettes, uncomplicated: Secondary | ICD-10-CM

## 2020-03-15 DIAGNOSIS — F411 Generalized anxiety disorder: Secondary | ICD-10-CM | POA: Diagnosis not present

## 2020-03-15 MED ORDER — NORTRIPTYLINE HCL 50 MG PO CAPS: 50.0000 mg | ORAL_CAPSULE | Freq: Every day | ORAL | 1 refills | Status: DC

## 2020-03-15 MED ORDER — LITHIUM CARBONATE ER 450 MG PO TBCR: 450.0000 mg | EXTENDED_RELEASE_TABLET | Freq: Two times a day (BID) | ORAL | 1 refills | Status: DC

## 2020-03-15 MED ORDER — VENLAFAXINE HCL 75 MG PO TABS: 75.0000 mg | ORAL_TABLET | Freq: Every day | ORAL | 1 refills | Status: DC

## 2020-03-15 MED ORDER — DIVALPROEX SODIUM ER 250 MG PO TB24: 250.0000 mg | ORAL_TABLET | Freq: Every day | ORAL | 0 refills | Status: DC

## 2020-03-15 NOTE — Progress Notes (Signed)
Virtual Visit via Telephone Note  I connected with Catherine Kelley on 03/15/20 at  2:00 PM EDT by telephone and verified that I am speaking with the correct person using two identifiers.   I discussed the limitations, risks, security and privacy concerns of performing an evaluation and management service by telephone and the availability of in person appointments. I also discussed with the patient that there may be a patient responsible charge related to this service. The patient expressed understanding and agreed to proceed.   History of Present Illness: Patient was evaluated by phone session.  She is taking lithium 300 mg twice a day.  We have recommended blood work but she did not have the blood work.  She noticed increased lithium to help her mood irritability and her husband also noticed that she is sleeping better than before.  She still have trouble with her thinking process.  She gets some time anxious.  So far she is tolerating medication and reported no side effects including tremors shakes or any EPS.  Her energy level is fair.  Her appetite is okay.  She is excited because she received the second Covid vaccine.  She lives with her husband who is supportive.  She is hoping off of the vaccine she can socialize better than before.   Past Psychiatric History:Reviewed. H/Odepression,anxiety and bipolar disorder forlong time.Had tried Paxil, Prozac and Zoloft that cause headaches.Abilify caused stiffnes.Took Xanax for many years however it was not renewed by primary care physician when [name] office did not accept her insurance. Did not recall any history of suicidal attempt or psychiatric inpatient treatment.    Psychiatric Specialty Exam: Physical Exam  Review of Systems  There were no vitals taken for this visit.There is no height or weight on file to calculate BMI.  General Appearance: NA  Eye Contact:  NA  Speech:  Clear and Coherent  Volume:  Normal  Mood:  Anxious   Affect:  NA  Thought Process:  Descriptions of Associations: Intact  Orientation:  Full (Time, Place, and Person)  Thought Content:  Rumination  Suicidal Thoughts:  No  Homicidal Thoughts:  No  Memory:  Immediate;   Good Recent;   Fair Remote;   Fair  Judgement:  Intact  Insight:  Present  Psychomotor Activity:  NA  Concentration:  Concentration: Fair and Attention Span: Fair  Recall:  [employer] of Knowledge:  Good  Language:  Good  Akathisia:  No  Handed:  Right  AIMS (if indicated):     Assets:  Communication Skills Desire for Improvement Housing Resilience Social Support  ADL's:  Intact  Cognition:  WNL  Sleep:   better      Assessment and Plan: Bipolar disorder type I.  Generalized anxiety disorder.  Patient shown improvement with the lithium.  I recommend that she can try lithium 450 mg twice a day to have a better control her residual mood lability.  Cut down her Depakote to 250 mg and after 2 weeks she can stop.  Continue Pamelor 50 mg at bedtime and venlafaxine 75 mg daily.  Reinforced to had a blood work.  Patient promised she will go to her PCP Dr. Pamella Kelley for blood work.  I reminded that orders are already placed.  I recommend to call us back if is any question or any concern.  Reminded medication side effects.  Follow-up in 6 weeks.  I will forward my note to her PCP Dr. Pamella Kelley.  Follow Up Instructions:  I discussed the assessment and treatment plan with the patient. The patient was provided an opportunity to ask questions and all were answered. The patient agreed with the plan and demonstrated an understanding of the instructions.   The patient was advised to call back or seek an in-person evaluation if the symptoms worsen or if the condition fails to improve as anticipated.  I provided 15 minutes of non-face-to-face time during this encounter.   Kathlee Nations, MD

## 2020-03-30 DIAGNOSIS — H5213 Myopia, bilateral: Secondary | ICD-10-CM | POA: Diagnosis not present

## 2020-03-30 DIAGNOSIS — H18592 Other hereditary corneal dystrophies, left eye: Secondary | ICD-10-CM | POA: Diagnosis not present

## 2020-03-30 DIAGNOSIS — H04123 Dry eye syndrome of bilateral lacrimal glands: Secondary | ICD-10-CM | POA: Diagnosis not present

## 2020-03-30 DIAGNOSIS — H35033 Hypertensive retinopathy, bilateral: Secondary | ICD-10-CM | POA: Diagnosis not present

## 2020-03-30 DIAGNOSIS — H524 Presbyopia: Secondary | ICD-10-CM | POA: Diagnosis not present

## 2020-03-30 DIAGNOSIS — H52222 Regular astigmatism, left eye: Secondary | ICD-10-CM | POA: Diagnosis not present

## 2020-03-30 DIAGNOSIS — H2513 Age-related nuclear cataract, bilateral: Secondary | ICD-10-CM | POA: Diagnosis not present

## 2020-04-08 ENCOUNTER — Other Ambulatory Visit (HOSPITAL_COMMUNITY): Payer: Self-pay | Admitting: Psychiatry

## 2020-04-08 DIAGNOSIS — F319 Bipolar disorder, unspecified: Secondary | ICD-10-CM

## 2020-04-10 ENCOUNTER — Other Ambulatory Visit (HOSPITAL_COMMUNITY): Payer: Self-pay | Admitting: Psychiatry

## 2020-04-10 ENCOUNTER — Telehealth: Payer: Self-pay

## 2020-04-10 DIAGNOSIS — F319 Bipolar disorder, unspecified: Secondary | ICD-10-CM

## 2020-04-10 DIAGNOSIS — F411 Generalized anxiety disorder: Secondary | ICD-10-CM

## 2020-04-10 NOTE — Telephone Encounter (Signed)
Pt has appt on 5/14 with Orland Mustard

## 2020-04-10 NOTE — Telephone Encounter (Signed)
Closing encounter, no action needed

## 2020-04-13 ENCOUNTER — Ambulatory Visit (INDEPENDENT_AMBULATORY_CARE_PROVIDER_SITE_OTHER): Payer: Medicare Other | Admitting: Registered Nurse

## 2020-04-13 ENCOUNTER — Other Ambulatory Visit: Payer: Self-pay

## 2020-04-13 ENCOUNTER — Encounter: Payer: Self-pay | Admitting: Registered Nurse

## 2020-04-13 VITALS — BP 125/76 | HR 93 | Temp 97.7°F | Resp 17 | Ht 63.0 in | Wt 115.4 lb

## 2020-04-13 DIAGNOSIS — Z79899 Other long term (current) drug therapy: Secondary | ICD-10-CM

## 2020-04-13 DIAGNOSIS — N390 Urinary tract infection, site not specified: Secondary | ICD-10-CM | POA: Diagnosis not present

## 2020-04-13 DIAGNOSIS — R350 Frequency of micturition: Secondary | ICD-10-CM | POA: Diagnosis not present

## 2020-04-13 LAB — POCT URINALYSIS DIP (CLINITEK)
Bilirubin, UA: NEGATIVE
Glucose, UA: NEGATIVE mg/dL
Ketones, POC UA: NEGATIVE mg/dL
Nitrite, UA: POSITIVE — AB
POC PROTEIN,UA: NEGATIVE
Spec Grav, UA: 1.02 (ref 1.010–1.025)
Urobilinogen, UA: 0.2 E.U./dL
pH, UA: 6 (ref 5.0–8.0)

## 2020-04-13 MED ORDER — NITROFURANTOIN MONOHYD MACRO 100 MG PO CAPS: 100.0000 mg | ORAL_CAPSULE | Freq: Two times a day (BID) | ORAL | 0 refills | Status: AC

## 2020-04-13 NOTE — Progress Notes (Signed)
Acute Office Visit  Subjective:    Patient ID: Catherine Kelley, female    DOB: 09/05/1951, 69 y.o.   MRN: [id number]  Chief Complaint  Patient presents with   Urinary Tract Infection    Patient states since sunday she has been experiencing symptoms of a UTI because of Frequent urination followby some pressure. Per patient she has tried AZO pills it help some .    HPI Patient is in today for UTI symptoms  Started 5 days ago. Pressure, frequency, dysuria. No gross hematuria. Azo has helped relieve symptoms. No flank pain, nvd, fevers, chills fatigue. No new sexual partners, no risk for STI. No vaginal symptoms.  Has had frequent UTI in past - was referred to urology but refused to make appt. We will again encourage this today as she may benefit from preventative therapy or estrogen therapy.  Past Medical History:  Diagnosis Date   Anxiety    Arthritis    "jooints" (09/18/2017)   Asthma    Bipolar disorder ([place])    Bipolar I disorder ([place]) 10/11/2019   CAP (community acquired pneumonia) 09/18/2017   Cat allergies    Chronic bronchitis ([place])    "get it alot; maybe not q yr" (07/06/2014)   Chronic lower back pain    "spurs and pinched nerves" (09/18/2017)   COPD (chronic obstructive pulmonary disease) ([place])    DDD (degenerative disc disease), lumbar    Depression    psychiatrist Dr. Toy Care   Diastolic dysfunction    Emphysematous COPD ([place])    changes in right base along with patchy areas on last x-ray   Fall as cause of accidental injury at home as place of occurrence 10/11/2019   Fall involving wheelchair as cause of accidental injury 10/11/2019   Falls frequently    "several in 2017; fell at dr's office yesterday" (09/18/2017)   GERD (gastroesophageal reflux disease)    HCAP (healthcare-associated pneumonia) 02/12/2016   Heart murmur    History of cardiovascular stress test 08/15/2004   EF of 70% -- Normal stress cardiolite.  There is no evidence of  ischemia and there is normal LV function. -- [name]. MD   History of echocardiogram 12/24/2006   Est. EF of 42-70% NORMAL LV SYSTOLIC FUNCTION WITH IMPAIRED RELAXATION -- MILD AORTIC SCLEROSIS -- NORMAL PALONARY ARTERY PRESSURE -- NO OLD ECHOS FOR COMPARISON -- [name], MD   Hypertension    essential hypertension   Migraine    "when I was having my periods; none since then" (09/18/2017)   Necrotic pneumonia ([place])    recurrent/notes 02/12/2016   Pneumonia    "several times since May 2015" (07/06/2014)   Pollen allergies    Tobacco abuse    smoking about 1/2 pk of cigarettes a day   Tobacco dependence due to cigarettes 10/11/2019   Traumatic hematoma of knee, right, initial encounter 10/11/2019    Past Surgical History:  Procedure Laterality Date   CAROTID STENT     DILATION AND CURETTAGE OF UTERUS     TUBAL LIGATION  1986    Family History  Problem Relation Age of Onset   Stroke Mother    Heart disease Father    Hyperlipidemia Father    Hypertension Father    Breast cancer Maternal Aunt    Asthma Maternal Grandmother     Social History   Socioeconomic History   Marital status: Married    Spouse name: Fawne Hughley   Number of children: 2   Years  of education: Not on file   Highest education level: Associate degree: occupational, Hotel manager, or vocational program  Occupational History   Occupation: Chemical engineer: [employer]    Comment: [employer]  Tobacco Use   Smoking status: Current Every Day Smoker    Packs/day: 0.50    Years: 46.00    Pack years: 23.00    Types: Cigarettes   Smokeless tobacco: Never Used  Substance and Sexual Activity   Alcohol use: Yes    Alcohol/week: 0.0 standard drinks    Comment: 09/18/2017 "might have 1 drink/month; if that"   Drug use: No   Sexual activity: Not Currently  Other Topics Concern   Not on file  Social History Narrative   Patient currently lives with her husband.    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 No bird or hot tub exposure. No mold exposure. No recent travel.   Social Determinants of Health   Financial Resource Strain:    Difficulty of Paying Living Expenses:   Food Insecurity:    Worried About Charity fundraiser in the Last Year:    Arboriculturist in the Last Year:   Transportation Needs:    Film/video editor (Medical):    Lack of Transportation (Non-Medical):   Physical Activity:    Days of Exercise per Week:    Minutes of Exercise per Session:   Stress:    Feeling of Stress :   Social Connections:    Frequency of Communication with Friends and Family:    Frequency of Social Gatherings with Friends and Family:    Attends Religious Services:    Active Member of Clubs or Organizations:    Attends Music therapist:    Marital Status:   Intimate Partner Violence:    Fear of Current or Ex-Partner:    Emotionally Abused:    Physically Abused:    Sexually Abused:     Outpatient Medications Prior to Visit  Medication Sig Dispense Refill   albuterol (PROVENTIL) (2.5 MG/3ML) 0.083% nebulizer solution Take 2.5 mg by nebulization every 6 (six) hours as needed for wheezing or shortness of breath.     albuterol (VENTOLIN HFA) 108 (90 Base) MCG/ACT inhaler Inhale 2 puffs into the lungs every 6 (six) hours as needed for wheezing or shortness of breath. 18 g 11   alendronate (FOSAMAX) 70 MG tablet Take 1 tablet (70 mg total) by mouth every 7 (seven) days. Take with a full glass of water on an empty stomach. 4 tablet 11   amLODipine (NORVASC) 2.5 MG tablet TAKE 1 TABLET BY MOUTH EVERY DAY 90 tablet 1   aspirin EC 81 MG tablet Take 81 mg by mouth daily as needed.      budesonide-formoterol (SYMBICORT) 160-4.5 MCG/ACT inhaler Inhale 2 puffs into the lungs 2 (two) times daily. 1 Inhaler 12   calcium carbonate  (TUMS EX) 750 MG chewable tablet Chew 1 tablet by mouth 2 (two) times daily as needed for heartburn.     divalproex (DEPAKOTE ER) 250 MG 24 hr tablet Take 1 tablet (250 mg total) by mouth at bedtime. 30 tablet 0   fluticasone (FLONASE) 50 MCG/ACT nasal spray Place 2 sprays into both nostrils daily. 16 g 6   lithium carbonate (ESKALITH) 450 MG CR tablet Take 1 tablet (450 mg total) by mouth  2 (two) times daily. 60 tablet 1   methocarbamol (ROBAXIN) 500 MG tablet TAKE 1 TABLET (500 MG TOTAL) BY MOUTH EVERY 8 (EIGHT) HOURS AS NEEDED FOR MUSCLE SPASMS. 30 tablet 1   nortriptyline (PAMELOR) 50 MG capsule Take 1 capsule (50 mg total) by mouth at bedtime. 30 capsule 1   SPIRIVA HANDIHALER 18 MCG inhalation capsule      spironolactone (ALDACTONE) 50 MG tablet Take 1 tablet (50 mg total) by mouth daily. Office visit needed for refills 90 tablet 1   triamcinolone cream (KENALOG) 0.1 % Apply 1 application topically 2 (two) times daily. 30 g 1   venlafaxine (EFFEXOR) 75 MG tablet Take 1 tablet (75 mg total) by mouth daily. 30 tablet 1   Vitamin D, Ergocalciferol, (DRISDOL) 1.25 MG (50000 UT) CAPS capsule Take 1 capsule (50,000 Units total) by mouth every 7 (seven) days. (Patient taking differently: Take 50,000 Units by mouth every Tuesday. ) 5 capsule 3   doxycycline (VIBRA-TABS) 100 MG tablet Take 1 tablet (100 mg total) by mouth 2 (two) times daily. (Patient not taking: Reported on 01/17/2020) 20 tablet 0   HYDROcodone-acetaminophen (NORCO/VICODIN) 5-325 MG tablet Take 1-2 tablets by mouth every 6 (six) hours as needed for up to 30 doses for moderate pain. (Patient not taking: Reported on 01/17/2020) 30 tablet 0   No facility-administered medications prior to visit.    Allergies  Allergen Reactions   Sulfa Drugs Cross Reactors Hives and Rash   Ceclor [Cefaclor] Hives    Tolerated ceftazidime December 2016   Depakote Er [Divalproex Sodium Er] Other (See Comments)    Patient remarked that  when she was taking this TWICE a day, she became "clumsy" and felt more prone to stumbling   Escitalopram Oxalate Other (See Comments)    Pt does not recall ever taking medication (??)   Latex Itching   Sertraline Hcl Other (See Comments)    Severe headaches    Tape Itching and Rash    Band-Aids, also (PAPER TAPE IS TOLERATED)    Review of Systems Per hpi, others negative on 10pt ros    Objective:    Physical Exam Vitals and nursing note reviewed.  Constitutional:      General: She is not in acute distress.    Appearance: Normal appearance. She is normal weight. She is not ill-appearing, toxic-appearing or diaphoretic.  Cardiovascular:     Rate and Rhythm: Normal rate and regular rhythm.  Pulmonary:     Effort: Pulmonary effort is normal. No respiratory distress.  Neurological:     General: No focal deficit present.     Mental Status: She is alert and oriented to person, place, and time. Mental status is at baseline.     Cranial Nerves: No cranial nerve deficit.  Psychiatric:        Mood and Affect: Mood normal.        Behavior: Behavior normal.        Thought Content: Thought content normal.        Judgment: Judgment normal.     BP 125/76    Pulse 93    Temp 97.7 F (36.5 C) (Temporal)    Resp 17    Ht 5\' 3"  (1.6 m)    Wt 115 lb 6.4 oz (52.3 kg)    SpO2 94%    BMI 20.44 kg/m  Wt Readings from Last 3 Encounters:  04/13/20 115 lb 6.4 oz (52.3 kg)  01/06/20 113 lb 9.6 oz (51.5 kg)  12/23/19  115 lb 6.4 oz (52.3 kg)    There are no preventive care reminders to display for this patient.  There are no preventive care reminders to display for this patient.   Lab Results  Component Value Date   TSH 2.070 10/15/2018   Lab Results  Component Value Date   WBC 5.0 10/26/2019   HGB 12.6 10/26/2019   HCT 38.6 10/26/2019   MCV 92.6 10/26/2019   PLT 98 (L) 10/26/2019   Lab Results  Component Value Date   NA 134 (L) 10/26/2019   K 4.2 10/26/2019   CO2 26  10/26/2019   GLUCOSE 97 10/26/2019   BUN 10 10/26/2019   CREATININE 0.61 10/26/2019   BILITOT 0.7 10/21/2019   ALKPHOS 141 (H) 10/21/2019   AST 32 10/21/2019   ALT 23 10/21/2019   PROT 7.7 10/21/2019   ALBUMIN 3.5 10/21/2019   CALCIUM 8.9 10/26/2019   ANIONGAP 9 10/26/2019   GFR 186.01 02/04/2016   Lab Results  Component Value Date   CHOL 165 10/11/2019   Lab Results  Component Value Date   HDL 72 10/11/2019   Lab Results  Component Value Date   LDLCALC 77 10/11/2019   Lab Results  Component Value Date   TRIG 87 10/11/2019   Lab Results  Component Value Date   CHOLHDL 2.3 10/11/2019   Lab Results  Component Value Date   HGBA1C 5.2 07/20/2017       Assessment & Plan:   Problem List Items Addressed This Visit    None    Visit Diagnoses    Urinary frequency    -  Primary   Relevant Medications   nitrofurantoin, macrocrystal-monohydrate, (MACROBID) 100 MG capsule   Other Relevant Orders   POCT URINALYSIS DIP (CLINITEK) (Completed)   Urine Culture   Ambulatory referral to Urology   Frequent UTI       Relevant Medications   nitrofurantoin, macrocrystal-monohydrate, (MACROBID) 100 MG capsule   Other Relevant Orders   Ambulatory referral to Urology   Long-term current use of lithium       Relevant Orders   Lithium level   Valproic acid level   Comprehensive metabolic panel       Meds ordered this encounter  Medications   nitrofurantoin, macrocrystal-monohydrate, (MACROBID) 100 MG capsule    Sig: Take 1 capsule (100 mg total) by mouth 2 (two) times daily for 5 days.    Dispense:  10 capsule    Refill:  0    Order Specific Question:   Supervising Provider    Answer:   Forrest Moron [id number]   PLAN  UA dip shows nitrites  Macrobid po bid for five days  Culture sent out  Will draw lithium levels, cmp, valproic acid levels per Dr. Adele Schilder  Patient encouraged to call clinic with any questions, comments, or concerns.  Maximiano Coss, NP

## 2020-04-13 NOTE — Patient Instructions (Signed)
° ° ° °  If you have lab work done today you will be contacted with your lab results within the next 2 weeks.  If you have not heard from us then please contact us. The fastest way to get your results is to register for My Chart. ° ° °IF you received an x-ray today, you will receive an invoice from [facility]. Please contact [facility] at [phone] with questions or concerns regarding your invoice.  ° °IF you received labwork today, you will receive an invoice from [facility]. Please contact [facility] at [phone] with questions or concerns regarding your invoice.  ° °Our billing staff will not be able to assist you with questions regarding bills from these companies. ° °You will be contacted with the lab results as soon as they are available. The fastest way to get your results is to activate your My Chart account. Instructions are located on the last page of this paperwork. If you have not heard from us regarding the results in 2 weeks, please contact this office. °  ° ° ° °

## 2020-04-14 LAB — COMPREHENSIVE METABOLIC PANEL
ALT: 34 IU/L — ABNORMAL HIGH (ref 0–32)
AST: 33 IU/L (ref 0–40)
Albumin/Globulin Ratio: 1.4 (ref 1.2–2.2)
Albumin: 4.1 g/dL (ref 3.8–4.8)
Alkaline Phosphatase: 231 IU/L — ABNORMAL HIGH (ref 39–117)
BUN/Creatinine Ratio: 15 (ref 12–28)
BUN: 9 mg/dL (ref 8–27)
Bilirubin Total: 0.4 mg/dL (ref 0.0–1.2)
CO2: 20 mmol/L (ref 20–29)
Calcium: 9.4 mg/dL (ref 8.7–10.3)
Chloride: 101 mmol/L (ref 96–106)
Creatinine, Ser: 0.61 mg/dL (ref 0.57–1.00)
GFR calc Af Amer: 107 mL/min/{1.73_m2} (ref >59)
GFR calc non Af Amer: 93 mL/min/{1.73_m2} (ref >59)
Globulin, Total: 2.9 g/dL (ref 1.5–4.5)
Glucose: 102 mg/dL — ABNORMAL HIGH (ref 65–99)
Potassium: 4.2 mmol/L (ref 3.5–5.2)
Sodium: 135 mmol/L (ref 134–144)
Total Protein: 7 g/dL (ref 6.0–8.5)

## 2020-04-14 LAB — LITHIUM LEVEL: Lithium Lvl: 1.1 mmol/L (ref 0.6–1.2)

## 2020-04-14 LAB — VALPROIC ACID LEVEL: Valproic Acid Lvl: <4 ug/mL — ABNORMAL LOW (ref 50–100)

## 2020-04-15 LAB — URINE CULTURE

## 2020-04-16 NOTE — Progress Notes (Signed)
FYI Kathrin Ruddy, NP

## 2020-04-23 ENCOUNTER — Encounter (HOSPITAL_COMMUNITY): Payer: Medicare Other | Admitting: Psychiatry

## 2020-04-23 ENCOUNTER — Other Ambulatory Visit: Payer: Self-pay

## 2020-04-24 ENCOUNTER — Telehealth (INDEPENDENT_AMBULATORY_CARE_PROVIDER_SITE_OTHER): Payer: Medicare Other | Admitting: Psychiatry

## 2020-04-24 ENCOUNTER — Other Ambulatory Visit: Payer: Self-pay

## 2020-04-24 ENCOUNTER — Encounter (HOSPITAL_COMMUNITY): Payer: Self-pay | Admitting: Psychiatry

## 2020-04-24 DIAGNOSIS — F319 Bipolar disorder, unspecified: Secondary | ICD-10-CM | POA: Diagnosis not present

## 2020-04-24 DIAGNOSIS — F411 Generalized anxiety disorder: Secondary | ICD-10-CM

## 2020-04-24 MED ORDER — NORTRIPTYLINE HCL 50 MG PO CAPS: 50.0000 mg | ORAL_CAPSULE | Freq: Every day | ORAL | 2 refills | Status: DC

## 2020-04-24 MED ORDER — LITHIUM CARBONATE ER 450 MG PO TBCR: 450.0000 mg | EXTENDED_RELEASE_TABLET | Freq: Two times a day (BID) | ORAL | 2 refills | Status: DC

## 2020-04-24 MED ORDER — VENLAFAXINE HCL 75 MG PO TABS: 75.0000 mg | ORAL_TABLET | Freq: Every day | ORAL | 2 refills | Status: DC

## 2020-04-24 NOTE — Progress Notes (Signed)
Virtual Visit via Telephone Note  I connected with Catherine Kelley on 04/24/20 at 10:40 AM EDT by telephone and verified that I am speaking with the correct person using two identifiers.   I discussed the limitations, risks, security and privacy concerns of performing an evaluation and management service by telephone and the availability of in person appointments. I also discussed with the patient that there may be a patient responsible charge related to this service. The patient expressed understanding and agreed to proceed.  Patient location; home Provider location; home office  History of Present Illness: Patient is evaluated by phone session.  On the last visit we increased lithium 450 mg twice a day as patient is still have mood lability.  She noticed improvement in her mood, irritability and she is sleeping better.  She is also taking nortriptyline and venlafaxine.  She had blood work at her primary care physician and her lithium level is 1.1.  Her BUN/creatinine is normal.  She endorsed having symptoms of UTI few weeks ago but that has been clear after taking antibiotic.  She admitted some concern about her 56-year-old granddaughter who has hair loss and she worried about her.  Patient told her daughter is taking her to skin specialist and hoping she can grow her hair back.  Patient denies any mania, highs and lows, anger, impulsive behavior.  She lives with her husband who is supportive.  Patient like to keep her current medication.  Past Psychiatric History:Reviewed. H/Odepression,anxiety and bipolar disorder forlong time.Had tried Paxil, Prozac and Zoloft that cause headaches.Abilify caused stiffnes.Took Xanax for many years however it was not renewed by primary care physician when [name] office did not accept her insurance. Did not recall any history of suicidal attempt or psychiatric inpatient treatment.  Recent Results (from the past 2160 hour(s))  POCT URINALYSIS DIP (CLINITEK)      Status: Abnormal   Collection Time: 04/13/20  1:26 PM  Result Value Ref Range   Color, UA yellow yellow   Clarity, UA clear clear   Glucose, UA negative negative mg/dL   Bilirubin, UA negative negative   Ketones, POC UA negative negative mg/dL   Spec Grav, UA 1.020 1.010 - 1.025   Blood, UA trace-intact (A) negative   pH, UA 6.0 5.0 - 8.0   POC PROTEIN,UA negative negative, trace   Urobilinogen, UA 0.2 0.2 or 1.0 E.U./dL   Nitrite, UA Positive (A) Negative   Leukocytes, UA Small (1+) (A) Negative  Urine Culture     Status: None   Collection Time: 04/13/20  1:34 PM   Specimen: Urine   UR  Result Value Ref Range   Urine Culture, Routine Final report    Organism ID, Bacteria Comment     Comment: Mixed urogenital flora 10,000-25,000 colony forming units per mL   Lithium level     Status: None   Collection Time: 04/13/20  1:34 PM  Result Value Ref Range   Lithium Lvl 1.1 0.6 - 1.2 mmol/L    Comment:                                  Detection Limit = 0.1                           <0.1 indicates None Detected **Effective Apr 30, 2020 the reference interval**   for 161096 Lithium (Eskalith(R)), Serum will  be   changing to:         Age              Female              Female       All Ages         0.5 - 1.2        0.5 - 1.2   Valproic acid level     Status: Abnormal   Collection Time: 04/13/20  1:34 PM  Result Value Ref Range   Valproic Acid Lvl <4 (L) 50 - 100 ug/mL    Comment: **Verified by repeat analysis**                                 Detection Limit = 4                            <4 indicates None Detected Toxicity may occur at levels of 100-500. Measurements of free unbound valproic acid may improve the assess- ment of clinical response.   Comprehensive metabolic panel     Status: Abnormal   Collection Time: 04/13/20  1:34 PM  Result Value Ref Range   Glucose 102 (H) 65 - 99 mg/dL   BUN 9 8 - 27 mg/dL   Creatinine, Ser 0.61 0.57 - 1.00 mg/dL   GFR calc non Af  Amer 93 >59 mL/min/1.73   GFR calc Af Amer 107 >59 mL/min/1.73    Comment: **[facility] currently reports eGFR in compliance with the current**   recommendations of the [employer]. [facility] will   update reporting as new guidelines are published from the NKF-ASN   Task force.    BUN/Creatinine Ratio 15 12 - 28   Sodium 135 134 - 144 mmol/L   Potassium 4.2 3.5 - 5.2 mmol/L   Chloride 101 96 - 106 mmol/L   CO2 20 20 - 29 mmol/L   Calcium 9.4 8.7 - 10.3 mg/dL   Total Protein 7.0 6.0 - 8.5 g/dL   Albumin 4.1 3.8 - 4.8 g/dL   Globulin, Total 2.9 1.5 - 4.5 g/dL   Albumin/Globulin Ratio 1.4 1.2 - 2.2   Bilirubin Total 0.4 0.0 - 1.2 mg/dL   Alkaline Phosphatase 231 (H) 39 - 117 IU/L    Comment: **Effective Apr 16, 2020 Alkaline Phosphatase**   reference interval will be changing to:              Age                Female          Female           0 -  5 days         85 - 127       72 - 127           6 - 10 days         21 - 242       33 - 76          11 - 20 days        114 - 357      114 - 357          21 - 30 days        107 - 867      672 -  494           1 -  2 months      162 - 539      162 - 539           3 -  6 months      141 - 452      141 - 452           7 - 11 months      128 - 401      128 - 401   12 months -  6 years       170 - 369      170 - 369           7 - 12 years       161 - 409      161 - 409               13 years       166 - 435       83 - 227               14 years       121 - 375       56 - 161               15 years        75 - 279       72 - 134               16 years        57 - 207       39 - 121               17 years        72 - 161       64 - 113          91 - 20 years        66 - 125       77 - 106              >20 years         48 - 121       48 - 121    AST 33 0 - 40 IU/L   ALT 34 (H) 0 - 32 IU/L      Psychiatric Specialty Exam: Physical Exam  Review of Systems  There were no vitals taken for this visit.There is no height or weight  on file to calculate BMI.  General Appearance: NA  Eye Contact:  NA  Speech:  Normal Rate  Volume:  Normal  Mood:  Euthymic  Affect:  NA  Thought Process:  Goal Directed  Orientation:  Full (Time, Place, and Person)  Thought Content:  WDL  Suicidal Thoughts:  No  Homicidal Thoughts:  No  Memory:  Immediate;   Good Recent;   Fair Remote;   Fair  Judgement:  Intact  Insight:  Present  Psychomotor Activity:  NA  Concentration:  Concentration: Fair and Attention Span: Fair  Recall:  [employer] of Knowledge:  Good  Language:  Good  Akathisia:  No  Handed:  Right  AIMS (if indicated):     Assets:  Communication Skills Desire for Improvement Housing Social Support  ADL's:  Intact  Cognition:  WNL  Sleep:   improved      Assessment and Plan: Bipolar disorder type I.  Generalized  anxiety disorder.  I reviewed blood work results.  Her lithium level is 1.1.  She is no longer taking Depakote and also antibiotic which was given for the UTI.  She like to keep her current medication as it is helping her mood and irritability.  Continue lithium 450 mg twice a day, Pamelor 50 mg at bedtime and venlafaxine 75 mg daily.  Discussed medication side effects and benefits.  Recommended to call us back if she has any question or any concern.  Follow-up in 3 months.  Follow Up Instructions:    I discussed the assessment and treatment plan with the patient. The patient was provided an opportunity to ask questions and all were answered. The patient agreed with the plan and demonstrated an understanding of the instructions.   The patient was advised to call back or seek an in-person evaluation if the symptoms worsen or if the condition fails to improve as anticipated.  I provided 20 minutes of non-face-to-face time during this encounter.   Kathlee Nations, MD

## 2020-04-24 NOTE — Progress Notes (Signed)
This encounter was created in error - please disregard.

## 2020-07-06 ENCOUNTER — Other Ambulatory Visit: Payer: Self-pay | Admitting: Family Medicine

## 2020-07-06 ENCOUNTER — Ambulatory Visit (INDEPENDENT_AMBULATORY_CARE_PROVIDER_SITE_OTHER): Payer: Medicare Other

## 2020-07-06 ENCOUNTER — Encounter: Payer: Self-pay | Admitting: Family Medicine

## 2020-07-06 ENCOUNTER — Ambulatory Visit (INDEPENDENT_AMBULATORY_CARE_PROVIDER_SITE_OTHER): Payer: Medicare Other | Admitting: Family Medicine

## 2020-07-06 ENCOUNTER — Other Ambulatory Visit: Payer: Self-pay

## 2020-07-06 VITALS — BP 108/69 | HR 94 | Temp 97.7°F | Ht 63.0 in | Wt 115.0 lb

## 2020-07-06 DIAGNOSIS — I10 Essential (primary) hypertension: Secondary | ICD-10-CM | POA: Diagnosis not present

## 2020-07-06 DIAGNOSIS — M7989 Other specified soft tissue disorders: Secondary | ICD-10-CM | POA: Diagnosis not present

## 2020-07-06 DIAGNOSIS — R350 Frequency of micturition: Secondary | ICD-10-CM

## 2020-07-06 DIAGNOSIS — E559 Vitamin D deficiency, unspecified: Secondary | ICD-10-CM

## 2020-07-06 DIAGNOSIS — E785 Hyperlipidemia, unspecified: Secondary | ICD-10-CM | POA: Diagnosis not present

## 2020-07-06 DIAGNOSIS — M25511 Pain in right shoulder: Secondary | ICD-10-CM

## 2020-07-06 DIAGNOSIS — M19011 Primary osteoarthritis, right shoulder: Secondary | ICD-10-CM | POA: Diagnosis not present

## 2020-07-06 LAB — POCT URINALYSIS DIP (MANUAL ENTRY)
Bilirubin, UA: NEGATIVE
Blood, UA: NEGATIVE
Glucose, UA: NEGATIVE mg/dL
Ketones, POC UA: NEGATIVE mg/dL
Nitrite, UA: NEGATIVE
Protein Ur, POC: NEGATIVE mg/dL
Spec Grav, UA: 1.015 (ref 1.010–1.025)
Urobilinogen, UA: 0.2 E.U./dL
pH, UA: 6 (ref 5.0–8.0)

## 2020-07-06 MED ORDER — NITROFURANTOIN MONOHYD MACRO 100 MG PO CAPS: 100.0000 mg | ORAL_CAPSULE | Freq: Two times a day (BID) | ORAL | 0 refills | Status: DC

## 2020-07-06 MED ORDER — HYDROCODONE-ACETAMINOPHEN 5-325 MG PO TABS: 1.0000 | ORAL_TABLET | Freq: Four times a day (QID) | ORAL | 0 refills | Status: DC | PRN

## 2020-07-06 MED ORDER — PREDNISONE 10 MG PO TABS: ORAL_TABLET | ORAL | 0 refills | Status: AC

## 2020-07-06 NOTE — Progress Notes (Signed)
8/6/202111:39 AM  Catherine Kelley August 19, 1951, 69 y.o., female [id number]  Chief Complaint  Patient presents with  . Urinary Frequency    x 2 weeks   . R upper arm pain    on and off x 1 yr has worsened - requesting pain meds   . Medication Management    not using any daily inhaler    HPI:   Patient is a 69 y.o. female with past medical history significant for HTN, COPD, mood disorder, insomnia who presents today with several concerns  Having right shoulder/upper arm pain for past several weeks Unable to raise above your head Having swelling but no redness or warmth Right handed  Used to work as Emergency planning/management officer  Having burning with urination x 2 weeks Having urgency, having very mild intermittent hematuria No fever, chills, nausea or vomiting  Otherwise she is taking her BP meds (amlodipine and spironolactone) daily wo issues  Depression screen [facility] 2/9 07/06/2020 04/13/2020 10/18/2019  Decreased Interest 0 0 0  Down, Depressed, Hopeless 0 0 0  PHQ - 2 Score 0 0 0  Altered sleeping - - -  Tired, decreased energy - - -  Change in appetite - - -  Feeling bad or failure about yourself  - - -  Trouble concentrating - - -  Moving slowly or fidgety/restless - - -  Suicidal thoughts - - -  PHQ-9 Score - - -  Difficult doing work/chores - - -  Some recent data might be hidden    Fall Risk  07/06/2020 04/13/2020 01/06/2020 12/23/2019 10/18/2019  Falls in the past year? 0 1 0 0 0  Comment - - - - -  Number falls in past yr: 0 0 0 0 0  Injury with Fall? 0 0 0 - 0  Comment - - - - -  Risk for fall due to : - - - Mental status change;Medication side effect -  Follow up Falls evaluation completed Falls evaluation completed - - -     Allergies  Allergen Reactions  . Sulfa Drugs Cross Reactors Hives and Rash  . Ceclor [Cefaclor] Hives    Tolerated ceftazidime December 2016  . Depakote Er [Divalproex Sodium Er] Other (See Comments)    Patient remarked that when she was taking this  TWICE a day, she became "clumsy" and felt more prone to stumbling  . Escitalopram Oxalate Other (See Comments)    Pt does not recall ever taking medication (??)  . Latex Itching  . Sertraline Hcl Other (See Comments)    Severe headaches   . Tape Itching and Rash    Band-Aids, also (PAPER TAPE IS TOLERATED)    Prior to Admission medications   Medication Sig Start Date End Date Taking? Authorizing Provider  Acetaminophen (TYLENOL ARTHRITIS PAIN PO) Take by mouth.   Yes [provider]  albuterol (PROVENTIL) (2.5 MG/3ML) 0.083% nebulizer solution Take 2.5 mg by nebulization every 6 (six) hours as needed for wheezing or shortness of breath.   Yes [provider]  albuterol (VENTOLIN HFA) 108 (90 Base) MCG/ACT inhaler Inhale 2 puffs into the lungs every 6 (six) hours as needed for wheezing or shortness of breath. 01/06/20  Yes Rutherford Guys, MD  alendronate (FOSAMAX) 70 MG tablet Take 1 tablet (70 mg total) by mouth every 7 (seven) days. Take with a full glass of water on an empty stomach. 01/13/20  Yes Wendall Mola, NP  amLODipine (NORVASC) 2.5 MG tablet TAKE 1 TABLET  BY MOUTH EVERY DAY 07/06/20  Yes Rutherford Guys, MD  aspirin EC 81 MG tablet Take 81 mg by mouth daily as needed.    Yes [provider]  fluticasone (FLONASE) 50 MCG/ACT nasal spray Place 2 sprays into both nostrils daily. 07/15/19  Yes Maximiano Coss, NP  lithium carbonate (ESKALITH) 450 MG CR tablet Take 1 tablet (450 mg total) by mouth 2 (two) times daily. 04/24/20 04/24/21 Yes Arfeen, Arlyce Harman, MD  methocarbamol (ROBAXIN) 500 MG tablet TAKE 1 TABLET (500 MG TOTAL) BY MOUTH EVERY 8 (EIGHT) HOURS AS NEEDED FOR MUSCLE SPASMS. 02/06/20  Yes Rutherford Guys, MD  nortriptyline (PAMELOR) 50 MG capsule Take 1 capsule (50 mg total) by mouth at bedtime. 04/24/20  Yes Arfeen, Arlyce Harman, MD  spironolactone (ALDACTONE) 50 MG tablet Take 1 tablet (50 mg total) by mouth daily. Office visit needed for refills 10/31/19   Yes Rutherford Guys, MD  triamcinolone cream (KENALOG) 0.1 % Apply 1 application topically 2 (two) times daily. 10/18/19  Yes Wendall Mola, NP  venlafaxine (EFFEXOR) 75 MG tablet Take 1 tablet (75 mg total) by mouth daily. 04/24/20 04/24/21 Yes Arfeen, Arlyce Harman, MD  Vitamin D, Ergocalciferol, (DRISDOL) 1.25 MG (50000 UT) CAPS capsule Take 1 capsule (50,000 Units total) by mouth every 7 (seven) days. Patient taking differently: Take 50,000 Units by mouth every Tuesday.  10/14/19  Yes Wendall Mola, NP  budesonide-formoterol [facility]) 160-4.5 MCG/ACT inhaler Inhale 2 puffs into the lungs 2 (two) times daily. Patient not taking: Reported on 07/06/2020 09/19/17   Patrecia Pour, Christean Grief, MD  [facility] [place] HANDIHALER 18 MCG inhalation capsule  11/22/19   [provider]    Past Medical History:  Diagnosis Date  . Anxiety   . Arthritis    "jooints" (09/18/2017)  . Asthma   . Bipolar disorder ([place])   . Bipolar I disorder ([place]) 10/11/2019  . CAP (community acquired pneumonia) 09/18/2017  . Cat allergies   . Chronic bronchitis ([place])    "get it alot; maybe not q yr" (07/06/2014)  . Chronic lower back pain    "spurs and pinched nerves" (09/18/2017)  . COPD (chronic obstructive pulmonary disease) ([place])   . DDD (degenerative disc disease), lumbar   . Depression    psychiatrist Dr. Toy Care  . Diastolic dysfunction   . Emphysematous COPD ([address])    changes in right base along with patchy areas on last x-ray  . Fall as cause of accidental injury at home as place of occurrence 10/11/2019  . Fall involving wheelchair as cause of accidental injury 10/11/2019  . Falls frequently    "several in 2017; fell at dr's office yesterday" (09/18/2017)  . GERD (gastroesophageal reflux disease)   . HCAP (healthcare-associated pneumonia) 02/12/2016  . Heart murmur   . History of cardiovascular stress test 08/15/2004   EF of 70% -- Normal stress cardiolite.  There is no evidence of ischemia and there is normal  LV function. -- [name]. MD  . History of echocardiogram 12/24/2006   Est. EF of 82-99% NORMAL LV SYSTOLIC FUNCTION WITH IMPAIRED RELAXATION -- MILD AORTIC SCLEROSIS -- NORMAL PALONARY ARTERY PRESSURE -- NO OLD ECHOS FOR COMPARISON -- [name], MD  . Hypertension    essential hypertension  . Migraine    "when I was having my periods; none since then" (09/18/2017)  . Necrotic pneumonia ([place])    recurrent/notes 02/12/2016  . Pneumonia    "several times since May 2015" (07/06/2014)  . Pollen  allergies   . Tobacco abuse    smoking about 1/2 pk of cigarettes a day  . Tobacco dependence due to cigarettes 10/11/2019  . Traumatic hematoma of knee, right, initial encounter 10/11/2019    Past Surgical History:  Procedure Laterality Date  . CAROTID STENT    . DILATION AND CURETTAGE OF UTERUS    . TUBAL LIGATION  1986    Social History   Tobacco Use  . Smoking status: Current Every Day Smoker    Packs/day: 0.50    Years: 46.00    Pack years: 23.00    Types: Cigarettes  . Smokeless tobacco: Never Used  Substance Use Topics  . Alcohol use: Yes    Alcohol/week: 0.0 standard drinks    Comment: 09/18/2017 "might have 1 drink/month; if that"    Family History  Problem Relation Age of Onset  . Stroke Mother   . Heart disease Father   . Hyperlipidemia Father   . Hypertension Father   . Breast cancer Maternal Aunt   . Asthma Maternal Grandmother     Review of Systems  Constitutional: Negative for chills and fever.  Respiratory: Negative for cough and shortness of breath.   Cardiovascular: Negative for chest pain, palpitations and leg swelling.  Gastrointestinal: Negative for abdominal pain, nausea and vomiting.  per hpi   OBJECTIVE:  Today's Vitals   07/06/20 1120  BP: 108/69  Pulse: 94  Temp: 97.7 F (36.5 C)  SpO2: 95%  Weight: 115 lb (52.2 kg)  Height: 5\' 3"  (1.6 m)   Body mass index is 20.37 kg/m.   Physical Exam Vitals and nursing note  reviewed.  Constitutional:      Appearance: She is well-developed.  HENT:     Head: Normocephalic and atraumatic.     Mouth/Throat:     Pharynx: No oropharyngeal exudate.  Eyes:     General: No scleral icterus.    Extraocular Movements: Extraocular movements intact.     Conjunctiva/sclera: Conjunctivae normal.     Pupils: Pupils are equal, round, and reactive to light.  Cardiovascular:     Rate and Rhythm: Normal rate and regular rhythm.     Heart sounds: Normal heart sounds. No murmur heard.  No friction rub. No gallop.   Pulmonary:     Effort: Pulmonary effort is normal.     Breath sounds: Normal breath sounds. No wheezing, rhonchi or rales.  Musculoskeletal:     Right shoulder: Swelling and tenderness (over deltoid area) present. No deformity or bony tenderness. Decreased range of motion. Normal strength. Normal pulse.     Cervical back: Neck supple.     Right lower leg: No edema.     Left lower leg: No edema.  Skin:    General: Skin is warm and dry.  Neurological:     Mental Status: She is alert and oriented to person, place, and time.     Results for orders placed or performed in visit on 07/06/20 (from the past 24 hour(s))  POCT urinalysis dipstick     Status: Abnormal   Collection Time: 07/06/20 11:53 AM  Result Value Ref Range   Color, UA yellow yellow   Clarity, UA clear clear   Glucose, UA negative negative mg/dL   Bilirubin, UA negative negative   Ketones, POC UA negative negative mg/dL   Spec Grav, UA 1.015 1.010 - 1.025   Blood, UA negative negative   pH, UA 6.0 5.0 - 8.0   Protein Ur, POC negative negative mg/dL  Urobilinogen, UA 0.2 0.2 or 1.0 E.U./dL   Nitrite, UA Negative Negative   Leukocytes, UA Large (3+) (A) Negative    DG Shoulder Right  Result Date: 07/06/2020 CLINICAL DATA:  Pain and swelling EXAM: RIGHT SHOULDER - 2+ VIEW COMPARISON:  None. FINDINGS: Oblique, Y scapular, and axillary images were obtained. No fracture or dislocation. There is  osteoarthritic change in the acromioclavicular joint. Glenohumeral joint appears unremarkable. No erosive change. Visualized right lung clear. IMPRESSION: Osteoarthritic change in the right acromioclavicular joint. No fracture or dislocation. No erosion. Electronically Signed   By: Lowella Grip III M.D.   On: 07/06/2020 12:02     ASSESSMENT and PLAN  1. Urinary frequency - POCT urinalysis dipstick - lg LCE, treating empirically, cx pending - Urine Culture  2. Dyslipidemia Checking labs today, medications will be started as needed.  - Lipid panel  3. Essential hypertension Controlled. Continue current regime.  - Basic Metabolic Panel  4. Vitamin D deficiency Checking labs today, medications will be adjusted as needed.  - Vitamin D, 25-hydroxy  5. Acute pain of right shoulder Exam suggestive of bursitis. Discussed ice, tx pred taper (reviewed r/se/b) and vicodin prn pain, pmp reviewed. Referring to ortho for further mgt - DG Shoulder Right - Ambulatory referral to Orthopedic Surgery  Other orders - Acetaminophen (TYLENOL ARTHRITIS PAIN PO); Take by mouth. - predniSONE (DELTASONE) 10 MG tablet; Take 4 tablets (40 mg total) by mouth daily with breakfast for 1 day, THEN 3 tablets (30 mg total) daily with breakfast for 1 day, THEN 2 tablets (20 mg total) daily with breakfast for 1 day, THEN 1 tablet (10 mg total) daily with breakfast for 1 day. - HYDROcodone-acetaminophen (NORCO/VICODIN) 5-325 MG tablet; Take 1 tablet by mouth every 6 (six) hours as needed for moderate pain. - nitrofurantoin, macrocrystal-monohydrate, (MACROBID) 100 MG capsule; Take 1 capsule (100 mg total) by mouth 2 (two) times daily.  Return in about 6 months (around 01/06/2021).    Rutherford Guys, MD [facility] [place], [place] [address] Ph.  [phone] Fax [phone]

## 2020-07-06 NOTE — Patient Instructions (Signed)
° ° ° °  If you have lab work done today you will be contacted with your lab results within the next 2 weeks.  If you have not heard from us then please contact us. The fastest way to get your results is to register for My Chart. ° ° °IF you received an x-ray today, you will receive an invoice from [facility]. Please contact [facility] at [phone] with questions or concerns regarding your invoice.  ° °IF you received labwork today, you will receive an invoice from [facility]. Please contact [facility] at [phone] with questions or concerns regarding your invoice.  ° °Our billing staff will not be able to assist you with questions regarding bills from these companies. ° °You will be contacted with the lab results as soon as they are available. The fastest way to get your results is to activate your My Chart account. Instructions are located on the last page of this paperwork. If you have not heard from us regarding the results in 2 weeks, please contact this office. °  ° ° ° °

## 2020-07-07 LAB — BASIC METABOLIC PANEL
BUN/Creatinine Ratio: 12 (ref 12–28)
BUN: 7 mg/dL — ABNORMAL LOW (ref 8–27)
CO2: 21 mmol/L (ref 20–29)
Calcium: 9.5 mg/dL (ref 8.7–10.3)
Chloride: 102 mmol/L (ref 96–106)
Creatinine, Ser: 0.6 mg/dL (ref 0.57–1.00)
GFR calc Af Amer: 108 mL/min/{1.73_m2} (ref >59)
GFR calc non Af Amer: 93 mL/min/{1.73_m2} (ref >59)
Glucose: 100 mg/dL — ABNORMAL HIGH (ref 65–99)
Potassium: 4.3 mmol/L (ref 3.5–5.2)
Sodium: 136 mmol/L (ref 134–144)

## 2020-07-07 LAB — LIPID PANEL
Chol/HDL Ratio: 2.6 ratio (ref 0.0–4.4)
Cholesterol, Total: 169 mg/dL (ref 100–199)
HDL: 66 mg/dL (ref >39)
LDL Chol Calc (NIH): 86 mg/dL (ref 0–99)
Triglycerides: 95 mg/dL (ref 0–149)
VLDL Cholesterol Cal: 17 mg/dL (ref 5–40)

## 2020-07-07 LAB — URINE CULTURE

## 2020-07-07 LAB — VITAMIN D 25 HYDROXY (VIT D DEFICIENCY, FRACTURES): Vit D, 25-Hydroxy: 18.1 ng/mL — ABNORMAL LOW (ref 30.0–100.0)

## 2020-07-19 ENCOUNTER — Telehealth: Payer: Self-pay | Admitting: *Deleted

## 2020-07-19 NOTE — Telephone Encounter (Signed)
Schedule AWV.  

## 2020-07-25 ENCOUNTER — Encounter (HOSPITAL_COMMUNITY): Payer: Medicare Other | Admitting: Psychiatry

## 2020-07-25 ENCOUNTER — Other Ambulatory Visit: Payer: Self-pay

## 2020-07-26 NOTE — Progress Notes (Signed)
No show

## 2020-07-26 NOTE — Patient Instructions (Signed)
No show

## 2020-07-30 ENCOUNTER — Other Ambulatory Visit: Payer: Self-pay

## 2020-07-30 ENCOUNTER — Encounter (HOSPITAL_COMMUNITY): Payer: Self-pay | Admitting: Psychiatry

## 2020-07-30 ENCOUNTER — Telehealth (INDEPENDENT_AMBULATORY_CARE_PROVIDER_SITE_OTHER): Payer: Medicare Other | Admitting: Psychiatry

## 2020-07-30 VITALS — Wt 115.0 lb

## 2020-07-30 DIAGNOSIS — F319 Bipolar disorder, unspecified: Secondary | ICD-10-CM

## 2020-07-30 DIAGNOSIS — F411 Generalized anxiety disorder: Secondary | ICD-10-CM | POA: Diagnosis not present

## 2020-07-30 MED ORDER — HYDROXYZINE HCL 10 MG PO TABS: 10.0000 mg | ORAL_TABLET | Freq: Every evening | ORAL | 0 refills | Status: DC | PRN

## 2020-07-30 MED ORDER — LITHIUM CARBONATE ER 450 MG PO TBCR: 450.0000 mg | EXTENDED_RELEASE_TABLET | Freq: Two times a day (BID) | ORAL | 2 refills | Status: DC

## 2020-07-30 MED ORDER — NORTRIPTYLINE HCL 50 MG PO CAPS: 50.0000 mg | ORAL_CAPSULE | Freq: Every day | ORAL | 2 refills | Status: DC

## 2020-07-30 MED ORDER — VENLAFAXINE HCL 75 MG PO TABS: 75.0000 mg | ORAL_TABLET | Freq: Every day | ORAL | 2 refills | Status: DC

## 2020-07-30 NOTE — Progress Notes (Signed)
Virtual Visit via Telephone Note  I connected with Catherine Kelley on 07/30/20 at  2:20 PM EDT by telephone and verified that I am speaking with the correct person using two identifiers.  Location: Patient: home Provider: home office   I discussed the limitations, risks, security and privacy concerns of performing an evaluation and management service by telephone and the availability of in person appointments. I also discussed with the patient that there may be a patient responsible charge related to this service. The patient expressed understanding and agreed to proceed.   History of Present Illness: Patient is evaluated by phone session.  She apologized missing last appointment.  She is taking her medication denies any side effects.  Recently she had blood work and her BUN/creatinine is normal.  However she has low vitamin D which is improved from the past but is still low.  She is taking nortriptyline, venlafaxine and lithium.  She feels her mood swings, irritability and mania is under control but there are nights when she struggle with sleep.  She used to take hydroxyzine 10 mg but it is out of refill.  She lives with her husband who is very supportive.  Patient is relieved that her granddaughter is now seeing a skin specialist and there is some improvement with hair loss.  Recently her PCP renew all her medication except vitamin D.  She is not sure why because her vitamin D level is still low.  She is no longer taking muscle relaxant.  She occasionally and very rarely takes hydrocodone.  Anxiety is under control.  She denies any panic attack.  She has no tremors shakes or any EPS.  Her appetite is okay.  Her weight is stable.  Her last lithium level was 1.1.  Past Psychiatric History: H/Odepression,anxiety and bipolar d/o. Had tried Paxil, Prozac and Zoloft that cause headaches.Abilify caused stiffnes.Took Xanax for many years however it was not renewed by primary care physician when Dr. Toy Care  office did not accept her insurance. Did not recall any history of suicidal attempt or psychiatric inpatient treatment.  Recent Results (from the past 2160 hour(s))  Urine Culture     Status: None   Collection Time: 07/06/20 11:43 AM   Specimen: Urine   UR  Result Value Ref Range   Urine Culture, Routine Final report    Organism ID, Bacteria Comment     Comment: Mixed urogenital flora Less than 10,000 colonies/mL   POCT urinalysis dipstick     Status: Abnormal   Collection Time: 07/06/20 11:53 AM  Result Value Ref Range   Color, UA yellow yellow   Clarity, UA clear clear   Glucose, UA negative negative mg/dL   Bilirubin, UA negative negative   Ketones, POC UA negative negative mg/dL   Spec Grav, UA 1.015 1.010 - 1.025   Blood, UA negative negative   pH, UA 6.0 5.0 - 8.0   Protein Ur, POC negative negative mg/dL   Urobilinogen, UA 0.2 0.2 or 1.0 E.U./dL   Nitrite, UA Negative Negative   Leukocytes, UA Large (3+) (A) Negative  Lipid panel     Status: None   Collection Time: 07/06/20 12:13 PM  Result Value Ref Range   Cholesterol, Total 169 100 - 199 mg/dL   Triglycerides 95 0 - 149 mg/dL   HDL 66 >39 mg/dL   VLDL Cholesterol Cal 17 5 - 40 mg/dL   LDL Chol Calc (NIH) 86 0 - 99 mg/dL   Chol/HDL Ratio 2.6 0.0 -  4.4 ratio    Comment:                                   T. Chol/HDL Ratio                                             Men  Women                               1/2 Avg.Risk  3.4    3.3                                   Avg.Risk  5.0    4.4                                2X Avg.Risk  9.6    7.1                                3X Avg.Risk 23.4   11.0   Vitamin D, 25-hydroxy     Status: Abnormal   Collection Time: 07/06/20 12:13 PM  Result Value Ref Range   Vit D, 25-Hydroxy 18.1 (L) 30.0 - 100.0 ng/mL    Comment: Vitamin D deficiency has been defined by the [place] practice guideline as a level of serum 25-OH vitamin D less than  20 ng/mL (1,2). The Endocrine Society went on to further define vitamin D insufficiency as a level between 21 and 29 ng/mL (2). 1. IOM (Institute of Medicine). 2010. Dietary reference    intakes for calcium and D. [place]: The    [employer]. 2. Holick MF, Binkley P[place]ico, Bischoff-Ferrari HA, et al.    Evaluation, treatment, and prevention of vitamin D    deficiency: an Endocrine Society clinical practice    guideline. JCEM. 2011 Jul; 96(7):1911-30.   Basic Metabolic Panel     Status: Abnormal   Collection Time: 07/06/20 12:13 PM  Result Value Ref Range   Glucose 100 (H) 65 - 99 mg/dL   BUN 7 (L) 8 - 27 mg/dL   Creatinine, Ser 0.60 0.57 - 1.00 mg/dL   GFR calc non Af Amer 93 >59 mL/min/1.73   GFR calc Af Amer 108 >59 mL/min/1.73    Comment: **[facility] currently reports eGFR in compliance with the current**   recommendations of the [employer]. [facility] will   update reporting as new guidelines are published from the NKF-ASN   Task force.    BUN/Creatinine Ratio 12 12 - 28   Sodium 136 134 - 144 mmol/L   Potassium 4.3 3.5 - 5.2 mmol/L   Chloride 102 96 - 106 mmol/L   CO2 21 20 - 29 mmol/L   Calcium 9.5 8.7 - 10.3 mg/dL      Psychiatric Specialty Exam: Physical Exam  Review of Systems  Weight 115 lb (52.2 kg).There is no height or weight on file to calculate BMI.  General Appearance: NA  Eye Contact:  NA  Speech:  Slow  Volume:  Decreased  Mood:  Anxious  Affect:  NA  Thought Process:  Descriptions of Associations: Intact  Orientation:  Full (Time, Place, and Person)  Thought Content:  Rumination  Suicidal Thoughts:  No  Homicidal Thoughts:  No  Memory:  Immediate;   Fair Recent;   Fair Remote;   Fair  Judgement:  Intact  Insight:  Shallow  Psychomotor Activity:  NA  Concentration:  Concentration: Fair and Attention Span: Fair  Recall:  Good  Fund of Knowledge:  Good  Language:  Good  Akathisia:  No  Handed:  Right  AIMS (if  indicated):     Assets:  Communication Skills Desire for Improvement Housing Resilience Social Support  ADL's:  Intact  Cognition:  WNL  Sleep:   fair      Assessment and Plan: Bipolar disorder type I.  Generalized anxiety disorder.  I reviewed blood work results.  She still have low vitamin D level.  She wants to have my note forward to her PCP [name] to address the issue of low vitamin D.  We will resume low-dose hydroxyzine 10 mg tablet that she can take as needed for insomnia which helps anxiety also.  Continue lithium 450 mg twice a day, nortriptyline 50 mg at bedtime and venlafaxine 75 mg daily.  We will also ask her PCP to have a lithium level on her next appointment.  Discussed medication side effects and benefits.  Recommended to call us back if she is any question or any concern.  Follow-up in 3 months.  Follow Up Instructions:    I discussed the assessment and treatment plan with the patient. The patient was provided an opportunity to ask questions and all were answered. The patient agreed with the plan and demonstrated an understanding of the instructions.   The patient was advised to call back or seek an in-person evaluation if the symptoms worsen or if the condition fails to improve as anticipated.  I provided 20 minutes of non-face-to-face time during this encounter.   Kathlee Nations, MD

## 2020-07-31 ENCOUNTER — Other Ambulatory Visit: Payer: Self-pay | Admitting: Family Medicine

## 2020-07-31 MED ORDER — VITAMIN D (ERGOCALCIFEROL) 1.25 MG (50000 UNIT) PO CAPS
50000.0000 [IU] | ORAL_CAPSULE | ORAL | 0 refills | Status: DC
Start: 2020-07-31 — End: 2021-02-07

## 2020-08-09 ENCOUNTER — Telehealth (INDEPENDENT_AMBULATORY_CARE_PROVIDER_SITE_OTHER): Payer: Medicare Other | Admitting: Family Medicine

## 2020-08-09 ENCOUNTER — Encounter: Payer: Self-pay | Admitting: Family Medicine

## 2020-08-09 ENCOUNTER — Other Ambulatory Visit: Payer: Self-pay

## 2020-08-09 DIAGNOSIS — J441 Chronic obstructive pulmonary disease with (acute) exacerbation: Secondary | ICD-10-CM | POA: Diagnosis not present

## 2020-08-09 MED ORDER — PREDNISONE 20 MG PO TABS: 40.0000 mg | ORAL_TABLET | Freq: Every day | ORAL | 0 refills | Status: DC

## 2020-08-09 NOTE — Progress Notes (Signed)
Virtual Visit Note  I connected with patient on 08/09/20 at 444pm by phone (patient has land line) and verified that I am speaking with the correct person using two identifiers. Catherine Kelley is currently located at home and patient is currently with them during visit. The provider, Rutherford Guys, MD is located in their office at time of visit.  I discussed the limitations, risks, security and privacy concerns of performing an evaluation and management service by telephone and the availability of in person appointments. I also discussed with the patient that there may be a patient responsible charge related to this service. The patient expressed understanding and agreed to proceed.   I provided 14 minutes of non-face-to-face time during this encounter.  Chief Complaint  Patient presents with  . Illness    having coughing , wheezing and chills for 1 week now, still has appetite. Has had tod use inhaler bid which. Asking for another referral for ortho. Still having arm pain    HPI  She has been having an COPD flare up She has been having dry cough and wheezing for the past week She has been having intermittent SOB, none today Albuterol has been helping Has not been feeling well, tired, cold She denies any fever No changes to taste or smell She is having watery runny and nasal congestion No itching or sneezing She has completed covid vaccine No sick contacts She cant afford symbircort and spiriva   Allergies  Allergen Reactions  . Sulfa Drugs Cross Reactors Hives and Rash  . Ceclor [Cefaclor] Hives    Tolerated ceftazidime December 2016  . Depakote Er [Divalproex Sodium Er] Other (See Comments)    Patient remarked that when she was taking this TWICE a day, she became "clumsy" and felt more prone to stumbling  . Escitalopram Oxalate Other (See Comments)    Pt does not recall ever taking medication (??)  . Latex Itching  . Sertraline Hcl Other (See Comments)    Severe  headaches   . Tape Itching and Rash    Band-Aids, also (PAPER TAPE IS TOLERATED)    Prior to Admission medications   Medication Sig Start Date End Date Taking? Authorizing Provider  Acetaminophen (TYLENOL ARTHRITIS PAIN PO) Take by mouth.   Yes [provider]  albuterol (PROVENTIL) (2.5 MG/3ML) 0.083% nebulizer solution Take 2.5 mg by nebulization every 6 (six) hours as needed for wheezing or shortness of breath.   Yes [provider]  albuterol (VENTOLIN HFA) 108 (90 Base) MCG/ACT inhaler Inhale 2 puffs into the lungs every 6 (six) hours as needed for wheezing or shortness of breath. 01/06/20  Yes Rutherford Guys, MD  alendronate (FOSAMAX) 70 MG tablet Take 1 tablet (70 mg total) by mouth every 7 (seven) days. Take with a full glass of water on an empty stomach. 01/13/20  Yes Wendall Mola, NP  amLODipine (NORVASC) 2.5 MG tablet TAKE 1 TABLET BY MOUTH EVERY DAY 07/06/20  Yes Rutherford Guys, MD  aspirin EC 81 MG tablet Take 81 mg by mouth daily as needed.    Yes [provider]  budesonide-formoterol (SYMBICORT) 160-4.5 MCG/ACT inhaler Inhale 2 puffs into the lungs 2 (two) times daily. 09/19/17  Yes Patrecia Pour, Christean Grief, MD  fluticasone [facility]) 50 MCG/ACT nasal spray Place 2 sprays into both nostrils daily. 07/15/19  Yes Maximiano Coss, NP  HYDROcodone-acetaminophen (NORCO/VICODIN) 5-325 MG tablet Take 1 tablet by mouth every 6 (six) hours as needed for moderate pain. 07/06/20  Yes Rutherford Guys, MD  hydrOXYzine (ATARAX/VISTARIL) 10 MG tablet Take 1 tablet (10 mg total) by mouth at bedtime as needed for anxiety (sleep). 07/30/20  Yes Arfeen, Arlyce Harman, MD  lithium carbonate (ESKALITH) 450 MG CR tablet Take 1 tablet (450 mg total) by mouth 2 (two) times daily. 07/30/20 07/30/21 Yes Arfeen, Arlyce Harman, MD  methocarbamol (ROBAXIN) 500 MG tablet TAKE 1 TABLET (500 MG TOTAL) BY MOUTH EVERY 8 (EIGHT) HOURS AS NEEDED FOR MUSCLE SPASMS. 02/06/20  Yes Rutherford Guys, MD    nitrofurantoin, macrocrystal-monohydrate, (MACROBID) 100 MG capsule Take 1 capsule (100 mg total) by mouth 2 (two) times daily. 07/06/20  Yes Rutherford Guys, MD  nortriptyline (PAMELOR) 50 MG capsule Take 1 capsule (50 mg total) by mouth at bedtime. 07/30/20  Yes Arfeen, Arlyce Harman, MD  SPIRIVA HANDIHALER 18 MCG inhalation capsule  11/22/19  Yes [provider]  spironolactone (ALDACTONE) 50 MG tablet Take 1 tablet (50 mg total) by mouth daily. Office visit needed for refills 10/31/19  Yes Rutherford Guys, MD  triamcinolone cream (KENALOG) 0.1 % Apply 1 application topically 2 (two) times daily. 10/18/19  Yes Wendall Mola, NP  venlafaxine (EFFEXOR) 75 MG tablet Take 1 tablet (75 mg total) by mouth daily. 07/30/20 07/30/21 Yes Arfeen, Arlyce Harman, MD  Vitamin D, Ergocalciferol, (DRISDOL) 1.25 MG (50000 UNIT) CAPS capsule Take 1 capsule (50,000 Units total) by mouth every 7 (seven) days. 07/31/20  Yes Rutherford Guys, MD    Past Medical History:  Diagnosis Date  . Anxiety   . Arthritis    "jooints" (09/18/2017)  . Asthma   . Bipolar disorder ([place])   . Bipolar I disorder ([place]) 10/11/2019  . CAP (community acquired pneumonia) 09/18/2017  . Cat allergies   . Chronic bronchitis ([place])    "get it alot; maybe not q yr" (07/06/2014)  . Chronic lower back pain    "spurs and pinched nerves" (09/18/2017)  . COPD (chronic obstructive pulmonary disease) ([place])   . DDD (degenerative disc disease), lumbar   . Depression    psychiatrist Dr. Toy Care  . Diastolic dysfunction   . Emphysematous COPD ([place])    changes in right base along with patchy areas on last x-ray  . Fall as cause of accidental injury at home as place of occurrence 10/11/2019  . Fall involving wheelchair as cause of accidental injury 10/11/2019  . Falls frequently    "several in 2017; fell at dr's office yesterday" (09/18/2017)  . GERD (gastroesophageal reflux disease)   . HCAP (healthcare-associated pneumonia) 02/12/2016  . Heart  murmur   . History of cardiovascular stress test 08/15/2004   EF of 70% -- Normal stress cardiolite.  There is no evidence of ischemia and there is normal LV function. -- [name]. MD  . History of echocardiogram 12/24/2006   Est. EF of 18-29% NORMAL LV SYSTOLIC FUNCTION WITH IMPAIRED RELAXATION -- MILD AORTIC SCLEROSIS -- NORMAL PALONARY ARTERY PRESSURE -- NO OLD ECHOS FOR COMPARISON -- [name], MD  . Hypertension    essential hypertension  . Migraine    "when I was having my periods; none since then" (09/18/2017)  . Necrotic pneumonia ([place])    recurrent/notes 02/12/2016  . Pneumonia    "several times since May 2015" (07/06/2014)  . Pollen allergies   . Tobacco abuse    smoking about 1/2 pk of cigarettes a day  . Tobacco dependence due to cigarettes 10/11/2019  . Traumatic hematoma of knee, right,  initial encounter 10/11/2019    Past Surgical History:  Procedure Laterality Date  . CAROTID STENT    . DILATION AND CURETTAGE OF UTERUS    . TUBAL LIGATION  1986    Social History   Tobacco Use  . Smoking status: Current Every Day Smoker    Packs/day: 0.50    Years: 46.00    Pack years: 23.00    Types: Cigarettes  . Smokeless tobacco: Never Used  Substance Use Topics  . Alcohol use: Yes    Alcohol/week: 0.0 standard drinks    Comment: 09/18/2017 "might have 1 drink/month; if that"    Family History  Problem Relation Age of Onset  . Stroke Mother   . Heart disease Father   . Hyperlipidemia Father   . Hypertension Father   . Breast cancer Maternal Aunt   . Asthma Maternal Grandmother     ROS Per hpi  Objective  Vitals as reported by the patient: none  Gen: AAOx3, NAD Speaking in full sentences, occ coughing, no audible wheezing   ASSESSMENT and PLAN  1. COPD with acute exacerbation (HCC) Discussed supportive measures, new meds r/se/b and RTC precautions.   Other orders - predniSONE (DELTASONE) 20 MG tablet; Take 2 tablets (40 mg total) by  mouth daily with breakfast.  FOLLOW-UP: prn   The above assessment and management plan was discussed with the patient. The patient verbalized understanding of and has agreed to the management plan. Patient is aware to call the clinic if symptoms persist or worsen. Patient is aware when to return to the clinic for a follow-up visit. Patient educated on when it is appropriate to go to the emergency department.     Rutherford Guys, MD [facility] at [place], [place] [address] Ph.  [phone] Fax [phone]

## 2020-08-17 ENCOUNTER — Other Ambulatory Visit: Payer: Self-pay

## 2020-08-17 ENCOUNTER — Telehealth: Payer: Self-pay | Admitting: Family Medicine

## 2020-08-17 ENCOUNTER — Encounter: Payer: Self-pay | Admitting: Registered Nurse

## 2020-08-17 ENCOUNTER — Ambulatory Visit: Payer: Medicare Other | Admitting: Registered Nurse

## 2020-08-17 VITALS — BP 142/68 | HR 100 | Temp 98.0°F | Resp 18 | Ht 63.0 in | Wt 119.0 lb

## 2020-08-17 DIAGNOSIS — L03115 Cellulitis of right lower limb: Secondary | ICD-10-CM

## 2020-08-17 DIAGNOSIS — H6122 Impacted cerumen, left ear: Secondary | ICD-10-CM

## 2020-08-17 DIAGNOSIS — L03116 Cellulitis of left lower limb: Secondary | ICD-10-CM | POA: Diagnosis not present

## 2020-08-17 MED ORDER — DOXYCYCLINE HYCLATE 100 MG PO TABS: 100.0000 mg | ORAL_TABLET | Freq: Two times a day (BID) | ORAL | 0 refills | Status: DC

## 2020-08-17 NOTE — Telephone Encounter (Signed)
Capsules ok to fill no problem Thank you  Rich Cortlin Marano, np

## 2020-08-17 NOTE — Progress Notes (Signed)
Established Patient Office Visit  Subjective:  Patient ID: Catherine Kelley, female    DOB: [birth year]  Age: 69 y.o. MRN: [id number]  CC:  Chief Complaint  Patient presents with  . Foot Swelling    patient states she has been having some foot and leg swelling but her feet will not seem to go down. Per patient her feet are hot and is retaining fluid .   Catherine Kelley Cerumen Impaction    patient states she has an ear that she can not seem to hear out of     HPI Catherine Kelley presents for cellulitis in both lower extremities and L side hearing loss  Cellulitis: baseline some swelling and varicosities in both legs however, has had 1 week of increased redness, swelling, and heat. Has tried to elevate legs consistently and this has helped maintain redness to only feet but it is not going away. Denies systemic symptoms. No open wounds.  L side hearing loss: says her daughter saw wax and tried to remove it. Somewhat better but now feels like maybe wax is stuck. No pain or drainage. No other upper respiratory or neuro symptoms.   No other complaints, feeling well today overall.   Past Medical History:  Diagnosis Date  . Anxiety   . Arthritis    "jooints" (09/18/2017)  . Asthma   . Bipolar disorder ([place])   . Bipolar I disorder ([place]) 10/11/2019  . CAP (community acquired pneumonia) 09/18/2017  . Cat allergies   . Chronic bronchitis ([place])    "get it alot; maybe not q yr" (07/06/2014)  . Chronic lower back pain    "spurs and pinched nerves" (09/18/2017)  . COPD (chronic obstructive pulmonary disease) ([place])   . DDD (degenerative disc disease), lumbar   . Depression    psychiatrist Dr. Toy Care  . Diastolic dysfunction   . Emphysematous COPD ([place])    changes in right base along with patchy areas on last x-ray  . Fall as cause of accidental injury at home as place of occurrence 10/11/2019  . Fall involving wheelchair as cause of accidental injury 10/11/2019  . Falls frequently    "several in 2017; fell at  dr's office yesterday" (09/18/2017)  . GERD (gastroesophageal reflux disease)   . HCAP (healthcare-associated pneumonia) 02/12/2016  . Heart murmur   . History of cardiovascular stress test 08/15/2004   EF of 70% -- Normal stress cardiolite.  There is no evidence of ischemia and there is normal LV function. -- [name]. MD  . History of echocardiogram 12/24/2006   Est. EF of 29-51% NORMAL LV SYSTOLIC FUNCTION WITH IMPAIRED RELAXATION -- MILD AORTIC SCLEROSIS -- NORMAL PALONARY ARTERY PRESSURE -- NO OLD ECHOS FOR COMPARISON -- [name], MD  . Hypertension    essential hypertension  . Migraine    "when I was having my periods; none since then" (09/18/2017)  . Necrotic pneumonia ([place])    recurrent/notes 02/12/2016  . Pneumonia    "several times since May 2015" (07/06/2014)  . Pollen allergies   . Tobacco abuse    smoking about 1/2 pk of cigarettes a day  . Tobacco dependence due to cigarettes 10/11/2019  . Traumatic hematoma of knee, right, initial encounter 10/11/2019    Past Surgical History:  Procedure Laterality Date  . CAROTID STENT    . DILATION AND CURETTAGE OF UTERUS    . TUBAL LIGATION  1986    Family History  Problem Relation Age of Onset  . Stroke  Mother   . Heart disease Father   . Hyperlipidemia Father   . Hypertension Father   . Breast cancer Maternal Aunt   . Asthma Maternal Grandmother     Social History   Socioeconomic History  . Marital status: Married    Spouse name: Makayla Confer  . Number of children: 2  . Years of education: Not on file  . Highest education level: Associate degree: occupational, Hotel manager, or vocational program  Occupational History  . Occupation: Chemical engineer: [employer]    Comment: [employer]  Tobacco Use  . Smoking status: Current Every Day Smoker    Packs/day: 0.50    Years: 46.00    Pack years: 23.00    Types: Cigarettes  . Smokeless tobacco: Never Used  Vaping Use  . Vaping Use: Never used  Substance  and Sexual Activity  . Alcohol use: Yes    Alcohol/week: 0.0 standard drinks    Comment: 09/18/2017 "might have 1 drink/month; if that"  . Drug use: No  . Sexual activity: Not Currently  Other Topics Concern  . Not on file  Social History Narrative   Patient currently lives with her husband.    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 No bird or hot tub exposure. No mold exposure. No recent travel.   Social Determinants of Health   Financial Resource Strain:   . Difficulty of Paying Living Expenses: Not on file  Food Insecurity:   . Worried About Charity fundraiser in the Last Year: Not on file  . Ran Out of Food in the Last Year: Not on file  Transportation Needs:   . Lack of Transportation (Medical): Not on file  . Lack of Transportation (Non-Medical): Not on file  Physical Activity:   . Days of Exercise per Week: Not on file  . Minutes of Exercise per Session: Not on file  Stress:   . Feeling of Stress : Not on file  Social Connections:   . Frequency of Communication with Friends and Family: Not on file  . Frequency of Social Gatherings with Friends and Family: Not on file  . Attends Religious Services: Not on file  . Active Member of Clubs or Organizations: Not on file  . Attends Archivist Meetings: Not on file  . Marital Status: Not on file  Intimate Partner Violence:   . Fear of Current or Ex-Partner: Not on file  . Emotionally Abused: Not on file  . Physically Abused: Not on file  . Sexually Abused: Not on file    Outpatient Medications Prior to Visit  Medication Sig Dispense Refill  . Acetaminophen (TYLENOL ARTHRITIS PAIN PO) Take by mouth.    Catherine Kelley albuterol (PROVENTIL) (2.5 MG/3ML) 0.083% nebulizer solution Take 2.5 mg by nebulization every 6 (six) hours as needed for wheezing or shortness of breath.    Catherine Kelley albuterol (VENTOLIN HFA) 108 (90 Base) MCG/ACT inhaler  Inhale 2 puffs into the lungs every 6 (six) hours as needed for wheezing or shortness of breath. 18 g 11  . alendronate (FOSAMAX) 70 MG tablet Take 1 tablet (70 mg total) by mouth every 7 (seven) days. Take with a full glass of water on an empty stomach. 4 tablet 11  . amLODipine (NORVASC) 2.5 MG tablet TAKE 1 TABLET BY MOUTH EVERY DAY 90 tablet 1  . aspirin EC  81 MG tablet Take 81 mg by mouth daily as needed.     . fluticasone (FLONASE) 50 MCG/ACT nasal spray Place 2 sprays into both nostrils daily. 16 g 6  . HYDROcodone-acetaminophen (NORCO/VICODIN) 5-325 MG tablet Take 1 tablet by mouth every 6 (six) hours as needed for moderate pain. 30 tablet 0  . hydrOXYzine (ATARAX/VISTARIL) 10 MG tablet Take 1 tablet (10 mg total) by mouth at bedtime as needed for anxiety (sleep). 30 tablet 0  . lithium carbonate (ESKALITH) 450 MG CR tablet Take 1 tablet (450 mg total) by mouth 2 (two) times daily. 60 tablet 2  . methocarbamol (ROBAXIN) 500 MG tablet TAKE 1 TABLET (500 MG TOTAL) BY MOUTH EVERY 8 (EIGHT) HOURS AS NEEDED FOR MUSCLE SPASMS. 30 tablet 1  . nitrofurantoin, macrocrystal-monohydrate, (MACROBID) 100 MG capsule Take 1 capsule (100 mg total) by mouth 2 (two) times daily. 14 capsule 0  . nortriptyline (PAMELOR) 50 MG capsule Take 1 capsule (50 mg total) by mouth at bedtime. 30 capsule 2  . predniSONE (DELTASONE) 20 MG tablet Take 2 tablets (40 mg total) by mouth daily with breakfast. 10 tablet 0  . spironolactone (ALDACTONE) 50 MG tablet Take 1 tablet (50 mg total) by mouth daily. Office visit needed for refills 90 tablet 1  . triamcinolone cream (KENALOG) 0.1 % Apply 1 application topically 2 (two) times daily. 30 g 1  . venlafaxine (EFFEXOR) 75 MG tablet Take 1 tablet (75 mg total) by mouth daily. 30 tablet 2  . Vitamin D, Ergocalciferol, (DRISDOL) 1.25 MG (50000 UNIT) CAPS capsule Take 1 capsule (50,000 Units total) by mouth every 7 (seven) days. 12 capsule 0   No facility-administered medications  prior to visit.    Allergies  Allergen Reactions  . Sulfa Drugs Cross Reactors Hives and Rash  . Ceclor [Cefaclor] Hives    Tolerated ceftazidime December 2016  . Depakote Er [Divalproex Sodium Er] Other (See Comments)    Patient remarked that when she was taking this TWICE a day, she became "clumsy" and felt more prone to stumbling  . Escitalopram Oxalate Other (See Comments)    Pt does not recall ever taking medication (??)  . Latex Itching  . Sertraline Hcl Other (See Comments)    Severe headaches   . Tape Itching and Rash    Band-Aids, also (PAPER TAPE IS TOLERATED)    ROS Review of Systems Pertinent positives and negatives per hpi    Objective:    Physical Exam Vitals and nursing note reviewed.  Constitutional:      General: She is not in acute distress.    Appearance: Normal appearance. She is normal weight. She is not ill-appearing, toxic-appearing or diaphoretic.  HENT:     Right Ear: Tympanic membrane, ear canal and external ear normal. There is no impacted cerumen.     Left Ear: External ear normal. There is impacted cerumen.  Cardiovascular:     Rate and Rhythm: Normal rate and regular rhythm.     Heart sounds: Normal heart sounds. No murmur heard.  No friction rub. No gallop.   Pulmonary:     Effort: Pulmonary effort is normal. No respiratory distress.     Breath sounds: Normal breath sounds. No stridor. No wheezing, rhonchi or rales.  Chest:     Chest wall: No tenderness.  Skin:    General: Skin is warm and dry.  Neurological:     General: No focal deficit present.     Mental Status: She is alert  and oriented to person, place, and time. Mental status is at baseline.  Psychiatric:        Mood and Affect: Mood normal.        Behavior: Behavior normal.        Thought Content: Thought content normal.        Judgment: Judgment normal.     BP (!) 142/68   Pulse 100   Temp 98 F (36.7 C) (Temporal)   Resp 18   Ht 5\' 3"  (1.6 m)   Wt 119 lb (54 kg)    SpO2 94%   BMI 21.08 kg/m  Wt Readings from Last 3 Encounters:  08/17/20 119 lb (54 kg)  07/06/20 115 lb (52.2 kg)  04/13/20 115 lb 6.4 oz (52.3 kg)     Health Maintenance Due  Topic Date Due  . COVID-19 Vaccine (2 - Pfizer 2-dose series) 04/29/2020    There are no preventive care reminders to display for this patient.  Lab Results  Component Value Date   TSH 2.070 10/15/2018   Lab Results  Component Value Date   WBC 5.0 10/26/2019   HGB 12.6 10/26/2019   HCT 38.6 10/26/2019   MCV 92.6 10/26/2019   PLT 98 (L) 10/26/2019   Lab Results  Component Value Date   NA 136 07/06/2020   K 4.3 07/06/2020   CO2 21 07/06/2020   GLUCOSE 100 (H) 07/06/2020   BUN 7 (L) 07/06/2020   CREATININE 0.60 07/06/2020   BILITOT 0.4 04/13/2020   ALKPHOS 231 (H) 04/13/2020   AST 33 04/13/2020   ALT 34 (H) 04/13/2020   PROT 7.0 04/13/2020   ALBUMIN 4.1 04/13/2020   CALCIUM 9.5 07/06/2020   ANIONGAP 9 10/26/2019   GFR 186.01 02/04/2016   Lab Results  Component Value Date   CHOL 169 07/06/2020   Lab Results  Component Value Date   HDL 66 07/06/2020   Lab Results  Component Value Date   LDLCALC 86 07/06/2020   Lab Results  Component Value Date   TRIG 95 07/06/2020   Lab Results  Component Value Date   CHOLHDL 2.6 07/06/2020   Lab Results  Component Value Date   HGBA1C 5.2 07/20/2017      Assessment & Plan:   Problem List Items Addressed This Visit    None    Visit Diagnoses    Cellulitis of both lower extremities    -  Primary   Relevant Medications   doxycycline (VIBRA-TABS) 100 MG tablet   Left ear impacted cerumen       Relevant Orders   Ear Lavage      No orders of the defined types were placed in this encounter.   Follow-up: No follow-ups on file.   PLAN  Ear lavage: cerumen removed. Still much inflammation. Discussed nonpharm care and return precautions. Pt voices understanding  Doxycycline 100mg  PO bid for 10 days for bilateral cellulitis of  lower extremities. Return prn  Patient encouraged to call clinic with any questions, comments, or concerns.  Maximiano Coss, NP

## 2020-08-17 NOTE — Telephone Encounter (Signed)
Sonia Side called reporting that the pharmacy is OUT of tablets, he wants to know if they can use the capsule form instead. Please advise   doxycycline (VIBRA-TABS) 100 MG tablet  (CAPSULE REQUEST)  [id number] [name], [place] - [place]  [address] [place] [address]  Phone: [phone] Fax: [phone]

## 2020-08-17 NOTE — Telephone Encounter (Signed)
Pharmacy is out of tablets for the Doxycycline 100mg . Pharmacy wanted to know If the capsules were okay to fill.  Please advise

## 2020-08-17 NOTE — Patient Instructions (Signed)
° ° ° °  If you have lab work done today you will be contacted with your lab results within the next 2 weeks.  If you have not heard from us then please contact us. The fastest way to get your results is to register for My Chart. ° ° °IF you received an x-ray today, you will receive an invoice from [facility]. Please contact [facility] at [phone] with questions or concerns regarding your invoice.  ° °IF you received labwork today, you will receive an invoice from [facility]. Please contact [facility] at [phone] with questions or concerns regarding your invoice.  ° °Our billing staff will not be able to assist you with questions regarding bills from these companies. ° °You will be contacted with the lab results as soon as they are available. The fastest way to get your results is to activate your My Chart account. Instructions are located on the last page of this paperwork. If you have not heard from us regarding the results in 2 weeks, please contact this office. °  ° ° ° °

## 2020-08-20 NOTE — Telephone Encounter (Signed)
It was over the weekend and Pharmacy went ahead and filled the prescription for the capsules

## 2020-08-22 ENCOUNTER — Other Ambulatory Visit (HOSPITAL_COMMUNITY): Payer: Self-pay | Admitting: Psychiatry

## 2020-08-22 DIAGNOSIS — F411 Generalized anxiety disorder: Secondary | ICD-10-CM

## 2020-08-31 ENCOUNTER — Other Ambulatory Visit: Payer: Self-pay | Admitting: Family Medicine

## 2020-08-31 DIAGNOSIS — I10 Essential (primary) hypertension: Secondary | ICD-10-CM

## 2020-09-21 ENCOUNTER — Encounter: Payer: Self-pay | Admitting: Family Medicine

## 2020-09-21 ENCOUNTER — Other Ambulatory Visit: Payer: Self-pay

## 2020-09-21 ENCOUNTER — Ambulatory Visit: Payer: Medicare Other | Admitting: Family Medicine

## 2020-09-21 VITALS — BP 134/71 | HR 84 | Temp 97.7°F | Ht 63.0 in | Wt 114.8 lb

## 2020-09-21 DIAGNOSIS — H7291 Unspecified perforation of tympanic membrane, right ear: Secondary | ICD-10-CM

## 2020-09-21 DIAGNOSIS — H65491 Other chronic nonsuppurative otitis media, right ear: Secondary | ICD-10-CM

## 2020-09-21 DIAGNOSIS — M545 Low back pain, unspecified: Secondary | ICD-10-CM

## 2020-09-21 DIAGNOSIS — N393 Stress incontinence (female) (male): Secondary | ICD-10-CM

## 2020-09-21 DIAGNOSIS — Z23 Encounter for immunization: Secondary | ICD-10-CM

## 2020-09-21 DIAGNOSIS — N3090 Cystitis, unspecified without hematuria: Secondary | ICD-10-CM | POA: Diagnosis not present

## 2020-09-21 LAB — POCT URINALYSIS DIP (MANUAL ENTRY)
Bilirubin, UA: NEGATIVE
Blood, UA: NEGATIVE
Glucose, UA: NEGATIVE mg/dL
Ketones, POC UA: NEGATIVE mg/dL
Nitrite, UA: POSITIVE — AB
Protein Ur, POC: NEGATIVE mg/dL
Spec Grav, UA: 1.02 (ref 1.010–1.025)
Urobilinogen, UA: 1 E.U./dL
pH, UA: 6 (ref 5.0–8.0)

## 2020-09-21 MED ORDER — NITROFURANTOIN MONOHYD MACRO 100 MG PO CAPS: 100.0000 mg | ORAL_CAPSULE | Freq: Two times a day (BID) | ORAL | 0 refills | Status: DC

## 2020-09-21 MED ORDER — TRAMADOL HCL 50 MG PO TABS: ORAL_TABLET | ORAL | 0 refills | Status: DC

## 2020-09-21 NOTE — Patient Instructions (Addendum)
  You have a urinary tract infection which is probably causing you to have difficulty controlling your urination and is contributing to your back pain.  Take nitrofurantoin 1 twice daily for urinary tract infection for 1 week  I am giving you a few pain pills of tramadol 50 mg.  However this medicine can increase your blood level of your Effexor so do not exceed taking 1 in 24 hours, primarily at bedtime for the pain.    The rest of the time take Tylenol (acetaminophen) 500 mg 2 pills 3 times daily as needed for pain.  It is important that you stop smoking.  It is damaging both your lungs and the circulation to your legs.  You are being referred to an ear nose and throat doctor to evaluate a chronic perforation of your ear with chronic infection of the right eardrum.   If you have lab work done today you will be contacted with your lab results within the next 2 weeks.  If you have not heard from [name] then please contact us. The fastest way to get your results is to register for My Chart.   IF you received an x-ray today, you will receive an invoice from [facility]. Please contact [facility] at [phone] with questions or concerns regarding your invoice.   IF you received labwork today, you will receive an invoice from [facility]. Please contact [facility] at [phone] with questions or concerns regarding your invoice.   Our billing staff will not be able to assist you with questions regarding bills from these companies.  You will be contacted with the lab results as soon as they are available. The fastest way to get your results is to activate your My Chart account. Instructions are located on the last page of this paperwork. If you have not heard from [name] regarding the results in 2 weeks, please contact this office.

## 2020-09-21 NOTE — Progress Notes (Signed)
Patient ID: Catherine Kelley, female    DOB: 03-01-1951  Age: 69 y.o. MRN: [id number]  Chief Complaint  Patient presents with  . Pain    low back pain with some stomach cramping. Also having pain in the right ear    Subjective:   Patient has been having some stomach cramping and cannot make it to the bathroom before she urinates on herself several times this week.  She has not been having any fever or chills.  She has low back pain which is been worse.  She does smoke.  She is concerned about the blueness of her feet.  The feet and legs hurt.  Current allergies, medications, problem list, past/family and social histories reviewed.  Objective:  BP 134/71   Pulse 84   Temp 97.7 F (36.5 C)   Ht 5\' 3"  (1.6 m)   Wt 114 lb 12.8 oz (52.1 kg)   SpO2 94%   BMI 20.34 kg/m   Medications were reviewed.  Alert and oriented.  Chest has fine crackles diffusely, probably from COPD from smoking.  Her heart was regular.  Abdomen soft.  Normal bowel sounds.  No masses or tenderness.  No CVA tenderness.  Tender across the very low back.  Feet pain down her bluish.  Pedal pulses are palpable but she has poor capillary refill.  Right TM has a posterior superior small perforation.  The drum is moist and deformed.  Left TM normal.  Assessment & Plan:   Assessment: 1. Cystitis   2. Bilateral low back pain, unspecified chronicity, unspecified whether sciatica present   3. Need for prophylactic vaccination and inoculation against influenza   4. Stress incontinence in female   5. Chronic exudative otitis media, right   6. Perforation of right tympanic membrane       Plan: See instructions.  She really wanted something for pain I gave her a few tramadol but cautioned her about the risk of elevating the blood levels of her psychotropic medicines.  Orders Placed This Encounter  Procedures  . Urine Culture  . Flu Vaccine QUAD High Dose(Fluad)  . Ambulatory referral to ENT    Referral Priority:    Routine    Referral Type:   Consultation    Referral Reason:   Specialty Services Required    Requested Specialty:   Otolaryngology    Number of Visits Requested:   1  . POCT urinalysis dipstick    Meds ordered this encounter  Medications  . nitrofurantoin, macrocrystal-monohydrate, (MACROBID) 100 MG capsule    Sig: Take 1 capsule (100 mg total) by mouth 2 (two) times daily.    Dispense:  14 capsule    Refill:  0  . traMADol (ULTRAM) 50 MG tablet    Sig: Take 1 at bedtime as needed for low back pain    Dispense:  10 tablet    Refill:  0         Patient Instructions    You have a urinary tract infection which is probably causing you to have difficulty controlling your urination and is contributing to your back pain.  Take nitrofurantoin 1 twice daily for urinary tract infection for 1 week  I am giving you a few pain pills of tramadol 50 mg.  However this medicine can increase your blood level of your Effexor so do not exceed taking 1 in 24 hours, primarily at bedtime for the pain.    The rest of the time take Tylenol (acetaminophen) 500 mg  2 pills 3 times daily as needed for pain.  It is important that you stop smoking.  It is damaging both your lungs and the circulation to your legs.  You are being referred to an ear nose and throat doctor to evaluate a chronic perforation of your ear with chronic infection of the right eardrum.   If you have lab work done today you will be contacted with your lab results within the next 2 weeks.  If you have not heard from [name] then please contact us. The fastest way to get your results is to register for My Chart.   IF you received an x-ray today, you will receive an invoice from [facility]. Please contact [facility] at [phone] with questions or concerns regarding your invoice.   IF you received labwork today, you will receive an invoice from [facility]. Please contact [facility] at [phone] with questions or  concerns regarding your invoice.   Our billing staff will not be able to assist you with questions regarding bills from these companies.  You will be contacted with the lab results as soon as they are available. The fastest way to get your results is to activate your My Chart account. Instructions are located on the last page of this paperwork. If you have not heard from [name] regarding the results in 2 weeks, please contact this office.        Return in about 6 months (around 03/22/2021), or if symptoms worsen or fail to improve.   Ruben Reason, MD 09/21/2020

## 2020-09-23 LAB — URINE CULTURE

## 2020-09-26 ENCOUNTER — Telehealth: Payer: Self-pay | Admitting: Family Medicine

## 2020-09-26 ENCOUNTER — Other Ambulatory Visit (HOSPITAL_COMMUNITY): Payer: Self-pay | Admitting: Psychiatry

## 2020-09-26 DIAGNOSIS — F411 Generalized anxiety disorder: Secondary | ICD-10-CM

## 2020-09-26 NOTE — Telephone Encounter (Signed)
My info didn't work. Patient is needing to contact ENT office and would like soneone to reach out to her with info on her recent referral from Dr. Linna Darner

## 2020-09-26 NOTE — Telephone Encounter (Signed)
I have called pt back and she stated that she has an ENT appointment scheduled already. I stated understanding.

## 2020-09-26 NOTE — Telephone Encounter (Signed)
Pt called looking for name of ENT Dr. Linna Darner referred her to. Gave her office number

## 2020-10-05 ENCOUNTER — Ambulatory Visit (INDEPENDENT_AMBULATORY_CARE_PROVIDER_SITE_OTHER): Payer: Medicare Other | Admitting: Otolaryngology

## 2020-10-05 ENCOUNTER — Other Ambulatory Visit: Payer: Self-pay

## 2020-10-05 VITALS — Temp 97.5°F

## 2020-10-05 DIAGNOSIS — H6121 Impacted cerumen, right ear: Secondary | ICD-10-CM

## 2020-10-05 DIAGNOSIS — H7291 Unspecified perforation of tympanic membrane, right ear: Secondary | ICD-10-CM | POA: Diagnosis not present

## 2020-10-05 DIAGNOSIS — H903 Sensorineural hearing loss, bilateral: Secondary | ICD-10-CM

## 2020-10-05 NOTE — Progress Notes (Signed)
HPI: Catherine Kelley is a 69 y.o. female who presents is referred by Dr. Ruben Reason for evaluation of right tympanic membrane perforation and decreased hearing.  She complains of decreased hearing more so on the right side.  She also has occasional itching in her ears more so on the right side.  She did not notice that she had a hole in her eardrum he denies any drainage from the ear..  Past Medical History:  Diagnosis Date  . Anxiety   . Arthritis    "jooints" (09/18/2017)  . Asthma   . Bipolar disorder ([place])   . Bipolar I disorder ([place]) 10/11/2019  . CAP (community acquired pneumonia) 09/18/2017  . Cat allergies   . Chronic bronchitis ([place])    "get it alot; maybe not q yr" (07/06/2014)  . Chronic lower back pain    "spurs and pinched nerves" (09/18/2017)  . COPD (chronic obstructive pulmonary disease) ([place])   . DDD (degenerative disc disease), lumbar   . Depression    psychiatrist Dr. Toy Care  . Diastolic dysfunction   . Emphysematous COPD ([place])    changes in right base along with patchy areas on last x-ray  . Fall as cause of accidental injury at home as place of occurrence 10/11/2019  . Fall involving wheelchair as cause of accidental injury 10/11/2019  . Falls frequently    "several in 2017; fell at dr's office yesterday" (09/18/2017)  . GERD (gastroesophageal reflux disease)   . HCAP (healthcare-associated pneumonia) 02/12/2016  . Heart murmur   . History of cardiovascular stress test 08/15/2004   EF of 70% -- Normal stress cardiolite.  There is no evidence of ischemia and there is normal LV function. -- [name]. MD  . History of echocardiogram 12/24/2006   Est. EF of 94-17% NORMAL LV SYSTOLIC FUNCTION WITH IMPAIRED RELAXATION -- MILD AORTIC SCLEROSIS -- NORMAL PALONARY ARTERY PRESSURE -- NO OLD ECHOS FOR COMPARISON -- [name], MD  . Hypertension    essential hypertension  . Migraine    "when I was having my periods; none since then" (09/18/2017)  . Necrotic  pneumonia ([place])    recurrent/notes 02/12/2016  . Pneumonia    "several times since May 2015" (07/06/2014)  . Pollen allergies   . Tobacco abuse    smoking about 1/2 pk of cigarettes a day  . Tobacco dependence due to cigarettes 10/11/2019  . Traumatic hematoma of knee, right, initial encounter 10/11/2019   Past Surgical History:  Procedure Laterality Date  . CAROTID STENT    . DILATION AND CURETTAGE OF UTERUS    . TUBAL LIGATION  1986   Social History   Socioeconomic History  . Marital status: Married    Spouse name: richard  . Number of children: 2  . Years of education: Not on file  . Highest education level: Associate degree: occupational, Hotel manager, or vocational program  Occupational History  . Occupation: Chemical engineer: [employer]    Comment: [employer]  Tobacco Use  . Smoking status: Current Every Day Smoker    Packs/day: 0.50    Years: 46.00    Pack years: 23.00    Types: Cigarettes  . Smokeless tobacco: Never Used  Vaping Use  . Vaping Use: Never used  Substance and Sexual Activity  . Alcohol use: Yes    Alcohol/week: 0.0 standard drinks    Comment: 09/18/2017 "might have 1 drink/month; if that"  . Drug use: No  . Sexual activity: Not Currently  Other Topics Concern  . Not on file  Social History Narrative   Patient currently lives with her husband.    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 No bird or hot tub exposure. No mold exposure. No recent travel.   Social Determinants of Health   Financial Resource Strain:   . Difficulty of Paying Living Expenses: Not on file  Food Insecurity:   . Worried About Charity fundraiser in the Last Year: Not on file  . Ran Out of Food in the Last Year: Not on file  Transportation Needs:   . Lack of Transportation (Medical): Not on file  . Lack of Transportation (Non-Medical): Not on file  Physical Activity:   . Days  of Exercise per Week: Not on file  . Minutes of Exercise per Session: Not on file  Stress:   . Feeling of Stress : Not on file  Social Connections:   . Frequency of Communication with Friends and Family: Not on file  . Frequency of Social Gatherings with Friends and Family: Not on file  . Attends Religious Services: Not on file  . Active Member of Clubs or Organizations: Not on file  . Attends Archivist Meetings: Not on file  . Marital Status: Not on file   Family History  Problem Relation Age of Onset  . Stroke Mother   . Heart disease Father   . Hyperlipidemia Father   . Hypertension Father   . Breast cancer Maternal Aunt   . Asthma Maternal Grandmother    Allergies  Allergen Reactions  . Sulfa Drugs Cross Reactors Hives and Rash  . Ceclor [Cefaclor] Hives    Tolerated ceftazidime December 2016  . Depakote Er [Divalproex Sodium Er] Other (See Comments)    Patient remarked that when she was taking this TWICE a day, she became "clumsy" and felt more prone to stumbling  . Escitalopram Oxalate Other (See Comments)    Pt does not recall ever taking medication (??)  . Latex Itching  . Sertraline Hcl Other (See Comments)    Severe headaches   . Tape Itching and Rash    Band-Aids, also (PAPER TAPE IS TOLERATED)   Prior to Admission medications   Medication Sig Start Date End Date Taking? Authorizing Provider  Acetaminophen (TYLENOL ARTHRITIS PAIN PO) Take by mouth.   Yes [provider]  albuterol (PROVENTIL) (2.5 MG/3ML) 0.083% nebulizer solution Take 2.5 mg by nebulization every 6 (six) hours as needed for wheezing or shortness of breath.   Yes [provider]  albuterol (VENTOLIN HFA) 108 (90 Base) MCG/ACT inhaler Inhale 2 puffs into the lungs every 6 (six) hours as needed for wheezing or shortness of breath. 01/06/20  Yes Rutherford Guys, MD  alendronate (FOSAMAX) 70 MG tablet Take 1 tablet (70 mg total) by mouth every 7 (seven) days. Take with a  full glass of water on an empty stomach. 01/13/20  Yes Wendall Mola, NP  amLODipine (NORVASC) 2.5 MG tablet TAKE 1 TABLET BY MOUTH EVERY DAY 07/06/20  Yes Rutherford Guys, MD  aspirin EC 81 MG tablet Take 81 mg by mouth daily as needed.    Yes [provider]  doxycycline (VIBRA-TABS) 100 MG tablet Take 1 tablet (100 mg total) by mouth 2 (two) times daily. 08/17/20  Yes Maximiano Coss, NP  fluticasone (FLONASE) 50  MCG/ACT nasal spray Place 2 sprays into both nostrils daily. 07/15/19  Yes Maximiano Coss, NP  HYDROcodone-acetaminophen (NORCO/VICODIN) 5-325 MG tablet Take 1 tablet by mouth every 6 (six) hours as needed for moderate pain. 07/06/20  Yes Rutherford Guys, MD  hydrOXYzine (ATARAX/VISTARIL) 10 MG tablet TAKE 1 TABLET (10 MG TOTAL) BY MOUTH AT BEDTIME AS NEEDED FOR ANXIETY (SLEEP). 08/29/20  Yes Arfeen, Arlyce Harman, MD  lithium carbonate (ESKALITH) 450 MG CR tablet Take 1 tablet (450 mg total) by mouth 2 (two) times daily. 07/30/20 07/30/21 Yes Arfeen, Arlyce Harman, MD  methocarbamol (ROBAXIN) 500 MG tablet TAKE 1 TABLET (500 MG TOTAL) BY MOUTH EVERY 8 (EIGHT) HOURS AS NEEDED FOR MUSCLE SPASMS. 02/06/20  Yes Rutherford Guys, MD  nitrofurantoin, macrocrystal-monohydrate, (MACROBID) 100 MG capsule Take 1 capsule (100 mg total) by mouth 2 (two) times daily. 09/21/20  Yes Posey Boyer, MD  nortriptyline (PAMELOR) 50 MG capsule Take 1 capsule (50 mg total) by mouth at bedtime. 07/30/20  Yes Arfeen, Arlyce Harman, MD  predniSONE (DELTASONE) 20 MG tablet Take 2 tablets (40 mg total) by mouth daily with breakfast. 08/09/20  Yes Rutherford Guys, MD  spironolactone (ALDACTONE) 50 MG tablet TAKE 1 TABLET (50 MG TOTAL) BY MOUTH DAILY. OFFICE VISIT NEEDED FOR REFILLS 08/31/20  Yes Rutherford Guys, MD  traMADol Veatrice Bourbon) 50 MG tablet Take 1 at bedtime as needed for low back pain 09/21/20  Yes Posey Boyer, MD  triamcinolone cream (KENALOG) 0.1 % Apply 1 application topically 2 (two) times daily. 10/18/19  Yes Wendall Mola, NP  venlafaxine (EFFEXOR) 75 MG tablet Take 1 tablet (75 mg total) by mouth daily. 07/30/20 07/30/21 Yes Arfeen, Arlyce Harman, MD  Vitamin D, Ergocalciferol, (DRISDOL) 1.25 MG (50000 UNIT) CAPS capsule Take 1 capsule (50,000 Units total) by mouth every 7 (seven) days. 07/31/20  Yes Rutherford Guys, MD     Positive ROS: Otherwise negative  All other systems have been reviewed and were otherwise negative with the exception of those mentioned in the HPI and as above.  Physical Exam: Constitutional: Alert, well-appearing, no acute distress Ears: External ears without lesions or tenderness.  On examination of the left ear canal left TM both were clear.  The TM had good mobility on pneumatic otoscopy.  On exam of the right side patient has some crusting and scabbing adjacent to a posterior superior TM perforation.  The scabbing and crusting and small amount of drainage was cleaned with suction.  After cleaning the ear canal I applied CSF HC powder to the right ear canal only.  She has a small posterior superior TM perforation which appears chronic.  Middle ear space was clear with no active drainage noted presently.  On hearing screening with the 512 1024 tuning fork she had hearing loss in both ears but much worse on the right side.  AC > BC bilaterally. Nasal: External nose without lesions. Septum minimal deformity. Clear nasal passages with no signs of infection. Oral: Lips and gums without lesions. Tongue and palate mucosa without lesions. Posterior oropharynx clear. Neck: No palpable adenopathy or masses Respiratory: Breathing comfortably  Skin: No facial/neck lesions or rash noted.  Cerumen impaction removal  Date/Time: 10/05/2020 5:41 PM Performed by: Rozetta Nunnery, MD Authorized by: Rozetta Nunnery, MD   Consent:    Consent obtained:  Verbal   Consent given by:  Patient   Risks discussed:  Pain and bleeding Procedure details:    Location:  R  ear   Procedure type:  suction   Post-procedure details:    Inspection:  Canal normal   Hearing quality:  Improved   Patient tolerance of procedure:  Tolerated well, no immediate complications Comments:     Right TM with a posterior superior small TM perforation.  No active drainage noted.    Assessment: Bilateral sensorineural hearing loss. Chronic right TM perforation  Plan: Discussed with Dashonna today that she has a chronic right TM perforation and would keep all water out of the right ear. She will follow-up for audiologic testing to evaluate the degree of hearing loss. If she has any drainage from the ear would recommend use of antibiotic eardrops such as Ciprodex. She will follow-up following her hearing test.   Radene Journey, MD   CC:

## 2020-10-19 ENCOUNTER — Ambulatory Visit (INDEPENDENT_AMBULATORY_CARE_PROVIDER_SITE_OTHER): Payer: Medicare Other | Admitting: Otolaryngology

## 2020-10-30 ENCOUNTER — Other Ambulatory Visit: Payer: Self-pay

## 2020-10-30 ENCOUNTER — Telehealth (INDEPENDENT_AMBULATORY_CARE_PROVIDER_SITE_OTHER): Payer: Medicare Other | Admitting: Psychiatry

## 2020-10-30 ENCOUNTER — Encounter (HOSPITAL_COMMUNITY): Payer: Self-pay | Admitting: Psychiatry

## 2020-10-30 DIAGNOSIS — F319 Bipolar disorder, unspecified: Secondary | ICD-10-CM

## 2020-10-30 DIAGNOSIS — F411 Generalized anxiety disorder: Secondary | ICD-10-CM | POA: Diagnosis not present

## 2020-10-30 MED ORDER — VENLAFAXINE HCL 75 MG PO TABS: 75.0000 mg | ORAL_TABLET | Freq: Every day | ORAL | 0 refills | Status: DC

## 2020-10-30 MED ORDER — LITHIUM CARBONATE ER 450 MG PO TBCR: 450.0000 mg | EXTENDED_RELEASE_TABLET | Freq: Two times a day (BID) | ORAL | 0 refills | Status: DC

## 2020-10-30 MED ORDER — NORTRIPTYLINE HCL 50 MG PO CAPS: 50.0000 mg | ORAL_CAPSULE | Freq: Every day | ORAL | 0 refills | Status: DC

## 2020-10-30 MED ORDER — HYDROXYZINE HCL 10 MG PO TABS: 10.0000 mg | ORAL_TABLET | Freq: Every evening | ORAL | 0 refills | Status: DC | PRN

## 2020-10-30 NOTE — Progress Notes (Signed)
Virtual Visit via Telephone Note  I connected with Catherine Kelley on 10/30/20 at  2:20 PM EST by telephone and verified that I am speaking with the correct person using two identifiers.  Location: Patient: Home Provider: Home Office   I discussed the limitations, risks, security and privacy concerns of performing an evaluation and management service by telephone and the availability of in person appointments. I also discussed with the patient that there may be a patient responsible charge related to this service. The patient expressed understanding and agreed to proceed.   History of Present Illness: Patient is evaluated by phone session.  She is not taking her medication as prescribed.  Since taking the hydroxyzine every night she is sleeping better and denies any irritability, mood swings.  She had a good Thanksgiving.  Patient told she like [place] and she enjoyed eating [place].  She does not want to change the medication since she feel current medicine is helping her mood, irritability, anxiety and mania.  She had a visit to her PCP but her lithium level was not drawn.  Patient is sad because her PCP [name] is leaving and she is not sure who she will see next visit.  She has no tremors shakes or any EPS.  She lives with her husband who is very supportive.   Past Psychiatric History: H/Odepression,anxiety and bipolar d/o. Had tried Paxil, Prozac and Zoloft that cause headaches.Abilify caused stiffnes.Took Xanax for many years however it was not renewed by primary care physician when Dr. Toy Care office did not accept her insurance. Did not recall any history of suicidal attempt or psychiatric inpatient treatment.   Psychiatric Specialty Exam: Physical Exam  Review of Systems  Weight 115 lb (52.2 kg).There is no height or weight on file to calculate BMI.  General Appearance: NA  Eye Contact:  NA  Speech:  Slow  Volume:  Decreased  Mood:  Euthymic  Affect:  NA  Thought Process:   Descriptions of Associations: Intact  Orientation:  Full (Time, Place, and Person)  Thought Content:  Logical  Suicidal Thoughts:  No  Homicidal Thoughts:  No  Memory:  Immediate;   Good Recent;   Fair Remote;   Fair  Judgement:  Intact  Insight:  Present  Psychomotor Activity:  NA  Concentration:  Concentration: Fair and Attention Span: Fair  Recall:  [employer] of Knowledge:  Good  Language:  Good  Akathisia:  No  Handed:  Right  AIMS (if indicated):     Assets:  Communication Skills Desire for Improvement Housing Resilience Social Support  ADL's:  Intact  Cognition:  WNL  Sleep:   better     Assessment and Plan: Bipolar disorder type I.  Generalized anxiety disorder.  Patient doing better since taking hydroxyzine every night.  Helping her sleep and anxiety.  She denies any mania or psychosis.  We will continue lithium 450 mg twice a day, nortriptyline 50 mg at bedtime, venlafaxine 75 mg daily and hydroxyzine 10 mg every night.  I reminded that we need to get lithium level and patient probably will she will check with her PCP on her next visit and remind the PCP to get lithium level.  She agreed if lithium level was not done on the next visit went  Follow Up Instructions:    I discussed the assessment and treatment plan with the patient. The patient was provided an opportunity to ask questions and all were answered. The patient agreed with the plan  and demonstrated an understanding of the instructions.   The patient was advised to call back or seek an in-person evaluation if the symptoms worsen or if the condition fails to improve as anticipated.  I provided 17 minutes of non-face-to-face time during this encounter.   Kathlee Nations, MD

## 2020-11-05 ENCOUNTER — Telehealth: Payer: Self-pay | Admitting: Family Medicine

## 2020-11-05 NOTE — Telephone Encounter (Signed)
Pt is wanting another referral for her R arm. She had one in August and never went. She would like a new one. Please advise at [phone].

## 2020-11-05 NOTE — Telephone Encounter (Signed)
Called pt have and informed her that she did not need another referral she just needs to call [facility] to schedule the appointment since they have already sent the referral. Pt stated understanding and wrote down phone number.   To [facility] [phone] [phone].

## 2020-11-07 ENCOUNTER — Other Ambulatory Visit: Payer: Self-pay

## 2020-11-07 ENCOUNTER — Telehealth (HOSPITAL_COMMUNITY): Payer: Self-pay | Admitting: *Deleted

## 2020-11-07 ENCOUNTER — Encounter (HOSPITAL_COMMUNITY): Payer: Self-pay | Admitting: Emergency Medicine

## 2020-11-07 DIAGNOSIS — Z20822 Contact with and (suspected) exposure to covid-19: Secondary | ICD-10-CM | POA: Insufficient documentation

## 2020-11-07 DIAGNOSIS — F411 Generalized anxiety disorder: Secondary | ICD-10-CM | POA: Diagnosis not present

## 2020-11-07 DIAGNOSIS — F3189 Other bipolar disorder: Secondary | ICD-10-CM | POA: Diagnosis not present

## 2020-11-07 DIAGNOSIS — L03119 Cellulitis of unspecified part of limb: Secondary | ICD-10-CM | POA: Diagnosis not present

## 2020-11-07 DIAGNOSIS — F319 Bipolar disorder, unspecified: Secondary | ICD-10-CM | POA: Diagnosis not present

## 2020-11-07 NOTE — Telephone Encounter (Signed)
Pt husband, Delfino Lovett, called with concerns regarding recent pt behaviors. Husband says that pt has been up all night for the last 5 nights. Left all four of the burners on the stove on and that scared him. He also said she is having avh. Pt next appointment is not until 01/21/21. Husband was given information for [facility]; address and phone number. He agreed that she needs to be evaluated.

## 2020-11-07 NOTE — Telephone Encounter (Signed)
I told her on the last appointment that she need to get lithium level.  I do not think she is taking her medication as prescribed.  I agree she may need evaluation including lithium level.

## 2020-11-07 NOTE — ED Notes (Addendum)
Cellulitis noted to Left foot with swelling.  Pt's Psychiatrist is Arfeen, who referred pt to [facility] for evaluation.

## 2020-11-07 NOTE — ED Triage Notes (Signed)
Pt with Bipolar DO, presents with hallucinations and insomnia x 5 days.  Denies SI or HI.

## 2020-11-07 NOTE — Telephone Encounter (Signed)
Agreed. Husband said that he didn't know if she is, maybe, when asked.

## 2020-11-08 ENCOUNTER — Emergency Department (HOSPITAL_COMMUNITY)
Admission: EM | Admit: 2020-11-08 | Discharge: 2020-11-08 | Disposition: A | Discharge status: Home / Self Care | Payer: Medicare Other | Attending: Emergency Medicine | Admitting: Emergency Medicine

## 2020-11-08 ENCOUNTER — Emergency Department (HOSPITAL_COMMUNITY): Payer: Medicare Other

## 2020-11-08 ENCOUNTER — Ambulatory Visit (HOSPITAL_COMMUNITY)
Admission: EM | Admit: 2020-11-08 | Discharge: 2020-11-08 | Disposition: A | Discharge status: Other Acute Inpatient Hospital | Payer: Medicare Other | Attending: Nurse Practitioner | Admitting: Nurse Practitioner

## 2020-11-08 DIAGNOSIS — I110 Hypertensive heart disease with heart failure: Secondary | ICD-10-CM | POA: Diagnosis not present

## 2020-11-08 DIAGNOSIS — J3489 Other specified disorders of nose and nasal sinuses: Secondary | ICD-10-CM | POA: Diagnosis not present

## 2020-11-08 DIAGNOSIS — I5030 Unspecified diastolic (congestive) heart failure: Secondary | ICD-10-CM | POA: Diagnosis not present

## 2020-11-08 DIAGNOSIS — Z7951 Long term (current) use of inhaled steroids: Secondary | ICD-10-CM | POA: Insufficient documentation

## 2020-11-08 DIAGNOSIS — R911 Solitary pulmonary nodule: Secondary | ICD-10-CM | POA: Diagnosis not present

## 2020-11-08 DIAGNOSIS — R790 Abnormal level of blood mineral: Secondary | ICD-10-CM | POA: Insufficient documentation

## 2020-11-08 DIAGNOSIS — F319 Bipolar disorder, unspecified: Secondary | ICD-10-CM

## 2020-11-08 DIAGNOSIS — Z8701 Personal history of pneumonia (recurrent): Secondary | ICD-10-CM | POA: Diagnosis not present

## 2020-11-08 DIAGNOSIS — R443 Hallucinations, unspecified: Secondary | ICD-10-CM | POA: Diagnosis not present

## 2020-11-08 DIAGNOSIS — J449 Chronic obstructive pulmonary disease, unspecified: Secondary | ICD-10-CM | POA: Diagnosis not present

## 2020-11-08 DIAGNOSIS — R062 Wheezing: Secondary | ICD-10-CM | POA: Diagnosis not present

## 2020-11-08 DIAGNOSIS — R7889 Finding of other specified substances, not normally found in blood: Secondary | ICD-10-CM

## 2020-11-08 DIAGNOSIS — R441 Visual hallucinations: Secondary | ICD-10-CM | POA: Diagnosis not present

## 2020-11-08 DIAGNOSIS — J479 Bronchiectasis, uncomplicated: Secondary | ICD-10-CM | POA: Diagnosis not present

## 2020-11-08 DIAGNOSIS — F3110 Bipolar disorder, current episode manic without psychotic features, unspecified: Secondary | ICD-10-CM | POA: Diagnosis not present

## 2020-11-08 DIAGNOSIS — R918 Other nonspecific abnormal finding of lung field: Secondary | ICD-10-CM | POA: Diagnosis not present

## 2020-11-08 DIAGNOSIS — L03119 Cellulitis of unspecified part of limb: Secondary | ICD-10-CM | POA: Diagnosis not present

## 2020-11-08 DIAGNOSIS — F17210 Nicotine dependence, cigarettes, uncomplicated: Secondary | ICD-10-CM | POA: Insufficient documentation

## 2020-11-08 DIAGNOSIS — R0902 Hypoxemia: Secondary | ICD-10-CM | POA: Diagnosis not present

## 2020-11-08 DIAGNOSIS — G4700 Insomnia, unspecified: Secondary | ICD-10-CM | POA: Diagnosis not present

## 2020-11-08 DIAGNOSIS — I2510 Atherosclerotic heart disease of native coronary artery without angina pectoris: Secondary | ICD-10-CM | POA: Diagnosis not present

## 2020-11-08 DIAGNOSIS — Z91040 Latex allergy status: Secondary | ICD-10-CM | POA: Diagnosis not present

## 2020-11-08 DIAGNOSIS — J45909 Unspecified asthma, uncomplicated: Secondary | ICD-10-CM | POA: Insufficient documentation

## 2020-11-08 DIAGNOSIS — F411 Generalized anxiety disorder: Secondary | ICD-10-CM

## 2020-11-08 DIAGNOSIS — Z79899 Other long term (current) drug therapy: Secondary | ICD-10-CM | POA: Insufficient documentation

## 2020-11-08 DIAGNOSIS — F29 Unspecified psychosis not due to a substance or known physiological condition: Secondary | ICD-10-CM | POA: Diagnosis not present

## 2020-11-08 DIAGNOSIS — R7989 Other specified abnormal findings of blood chemistry: Secondary | ICD-10-CM

## 2020-11-08 DIAGNOSIS — J439 Emphysema, unspecified: Secondary | ICD-10-CM | POA: Diagnosis not present

## 2020-11-08 DIAGNOSIS — Z7982 Long term (current) use of aspirin: Secondary | ICD-10-CM | POA: Diagnosis not present

## 2020-11-08 DIAGNOSIS — R442 Other hallucinations: Secondary | ICD-10-CM | POA: Diagnosis not present

## 2020-11-08 DIAGNOSIS — R4182 Altered mental status, unspecified: Secondary | ICD-10-CM | POA: Diagnosis not present

## 2020-11-08 LAB — CBC WITH DIFFERENTIAL/PLATELET
Abs Immature Granulocytes: 0.03 10*3/uL (ref 0.00–0.07)
Basophils Absolute: 0.1 10*3/uL (ref 0.0–0.1)
Basophils Relative: 1 %
Eosinophils Absolute: 0.7 10*3/uL — ABNORMAL HIGH (ref 0.0–0.5)
Eosinophils Relative: 7 %
HCT: 44.5 % (ref 36.0–46.0)
Hemoglobin: 15 g/dL (ref 12.0–15.0)
Immature Granulocytes: 0 %
Lymphocytes Relative: 14 %
Lymphs Abs: 1.5 10*3/uL (ref 0.7–4.0)
MCH: 30.7 pg (ref 26.0–34.0)
MCHC: 33.7 g/dL (ref 30.0–36.0)
MCV: 91 fL (ref 80.0–100.0)
Monocytes Absolute: 0.6 10*3/uL (ref 0.1–1.0)
Monocytes Relative: 6 %
Neutro Abs: 7.5 10*3/uL (ref 1.7–7.7)
Neutrophils Relative %: 72 %
Platelets: 170 10*3/uL (ref 150–400)
RBC: 4.89 MIL/uL (ref 3.87–5.11)
RDW: 12.9 % (ref 11.5–15.5)
WBC: 10.4 10*3/uL (ref 4.0–10.5)
nRBC: 0 % (ref 0.0–0.2)

## 2020-11-08 LAB — COMPREHENSIVE METABOLIC PANEL
ALT: 20 U/L (ref 0–44)
AST: 24 U/L (ref 15–41)
Albumin: 3.8 g/dL (ref 3.5–5.0)
Alkaline Phosphatase: 154 U/L — ABNORMAL HIGH (ref 38–126)
Anion gap: 10 (ref 5–15)
BUN: 13 mg/dL (ref 8–23)
CO2: 25 mmol/L (ref 22–32)
Calcium: 9.9 mg/dL (ref 8.9–10.3)
Chloride: 100 mmol/L (ref 98–111)
Creatinine, Ser: 0.65 mg/dL (ref 0.44–1.00)
GFR, Estimated: >60 mL/min (ref >60)
Glucose, Bld: 88 mg/dL (ref 70–99)
Potassium: 3.5 mmol/L (ref 3.5–5.1)
Sodium: 135 mmol/L (ref 135–145)
Total Bilirubin: 0.7 mg/dL (ref 0.3–1.2)
Total Protein: 7.2 g/dL (ref 6.5–8.1)

## 2020-11-08 LAB — LIPID PANEL
Cholesterol: 160 mg/dL (ref 0–200)
HDL: 73 mg/dL (ref >40)
LDL Cholesterol: 72 mg/dL (ref 0–99)
Total CHOL/HDL Ratio: 2.2 RATIO
Triglycerides: 73 mg/dL (ref <150)
VLDL: 15 mg/dL (ref 0–40)

## 2020-11-08 LAB — RAPID URINE DRUG SCREEN, HOSP PERFORMED
Amphetamines: NOT DETECTED
Barbiturates: NOT DETECTED
Benzodiazepines: NOT DETECTED
Cocaine: NOT DETECTED
Opiates: NOT DETECTED
Tetrahydrocannabinol: NOT DETECTED

## 2020-11-08 LAB — RESP PANEL BY RT-PCR (FLU A&B, COVID) ARPGX2
Influenza A by PCR: NEGATIVE
Influenza B by PCR: NEGATIVE
SARS Coronavirus 2 by RT PCR: NEGATIVE

## 2020-11-08 LAB — HEMOGLOBIN A1C
Hgb A1c MFr Bld: 4.8 % (ref 4.8–5.6)
Mean Plasma Glucose: 91.06 mg/dL

## 2020-11-08 LAB — LITHIUM LEVEL
Lithium Lvl: 1.51 mmol/L (ref 0.60–1.20)
Lithium Lvl: 1.48 mmol/L — ABNORMAL HIGH (ref 0.60–1.20)
Lithium Lvl: 1.37 mmol/L — ABNORMAL HIGH (ref 0.60–1.20)

## 2020-11-08 LAB — T4, FREE: Free T4: 1.07 ng/dL (ref 0.61–1.12)

## 2020-11-08 LAB — POC SARS CORONAVIRUS 2 AG -  ED: SARS Coronavirus 2 Ag: NEGATIVE

## 2020-11-08 LAB — TSH: TSH: 7.598 u[IU]/mL — ABNORMAL HIGH (ref 0.350–4.500)

## 2020-11-08 LAB — ETHANOL: Alcohol, Ethyl (B): <10 mg/dL (ref <10)

## 2020-11-08 LAB — POC SARS CORONAVIRUS 2 AG: SARS Coronavirus 2 Ag: NEGATIVE

## 2020-11-08 MED ORDER — AMLODIPINE BESYLATE 5 MG PO TABS: 2.5000 mg | ORAL_TABLET | Freq: Every day | ORAL | Status: DC

## 2020-11-08 MED ORDER — DOXYCYCLINE HYCLATE 100 MG PO TABS
100.0000 mg | ORAL_TABLET | Freq: Two times a day (BID) | ORAL | Status: DC
  Administered 2020-11-08: 100 mg via ORAL
  Filled 2020-11-08: qty 1

## 2020-11-08 MED ORDER — ALUM & MAG HYDROXIDE-SIMETH 200-200-20 MG/5ML PO SUSP: 30.0000 mL | ORAL | Status: DC | PRN

## 2020-11-08 MED ORDER — LITHIUM CARBONATE ER 450 MG PO TBCR: 450.0000 mg | EXTENDED_RELEASE_TABLET | Freq: Two times a day (BID) | ORAL | Status: DC

## 2020-11-08 MED ORDER — VENLAFAXINE HCL 37.5 MG PO TABS: 75.0000 mg | ORAL_TABLET | Freq: Every day | ORAL | Status: DC

## 2020-11-08 MED ORDER — VENLAFAXINE HCL ER 75 MG PO CP24: 75.0000 mg | ORAL_CAPSULE | Freq: Every day | ORAL | Status: DC

## 2020-11-08 MED ORDER — VENLAFAXINE HCL 75 MG PO TABS: 75.0000 mg | ORAL_TABLET | Freq: Every day | ORAL | Status: DC

## 2020-11-08 MED ORDER — MAGNESIUM HYDROXIDE 400 MG/5ML PO SUSP: 30.0000 mL | Freq: Every day | ORAL | Status: DC | PRN

## 2020-11-08 MED ORDER — AMLODIPINE BESYLATE 5 MG PO TABS
2.5000 mg | ORAL_TABLET | Freq: Every day | ORAL | Status: DC
  Administered 2020-11-08: 2.5 mg via ORAL
  Filled 2020-11-08: qty 1

## 2020-11-08 MED ORDER — ASPIRIN EC 81 MG PO TBEC: 81.0000 mg | DELAYED_RELEASE_TABLET | Freq: Every day | ORAL | Status: DC

## 2020-11-08 MED ORDER — HYDROXYZINE HCL 10 MG PO TABS: 10.0000 mg | ORAL_TABLET | Freq: Every evening | ORAL | Status: DC | PRN

## 2020-11-08 MED ORDER — ACETAMINOPHEN 325 MG PO TABS: 650.0000 mg | ORAL_TABLET | Freq: Four times a day (QID) | ORAL | Status: DC | PRN

## 2020-11-08 MED ORDER — HYDROXYZINE HCL 10 MG PO TABS
10.0000 mg | ORAL_TABLET | Freq: Every evening | ORAL | Status: DC | PRN
  Administered 2020-11-08: 10 mg via ORAL
  Filled 2020-11-08: qty 1

## 2020-11-08 MED ORDER — NORTRIPTYLINE HCL 25 MG PO CAPS
50.0000 mg | ORAL_CAPSULE | Freq: Every day | ORAL | Status: DC
  Administered 2020-11-08: 50 mg via ORAL
  Filled 2020-11-08: qty 2

## 2020-11-08 MED ORDER — NORTRIPTYLINE HCL 25 MG PO CAPS: 50.0000 mg | ORAL_CAPSULE | Freq: Every day | ORAL | Status: DC

## 2020-11-08 MED ORDER — ACETAMINOPHEN 325 MG PO TABS: 650.0000 mg | ORAL_TABLET | ORAL | Status: DC | PRN

## 2020-11-08 MED ORDER — ALBUTEROL SULFATE HFA 108 (90 BASE) MCG/ACT IN AERS: 2.0000 | INHALATION_SPRAY | Freq: Four times a day (QID) | RESPIRATORY_TRACT | Status: DC | PRN

## 2020-11-08 MED ORDER — ASPIRIN EC 81 MG PO TBEC
81.0000 mg | DELAYED_RELEASE_TABLET | Freq: Every day | ORAL | Status: DC
  Administered 2020-11-08: 81 mg via ORAL
  Filled 2020-11-08: qty 1

## 2020-11-08 NOTE — ED Provider Notes (Addendum)
Behavioral Health Admission H&P [facility] & OBS)  Date: 11/08/20 Patient Name: Catherine Kelley MRN: [id number] Chief Complaint:  Chief Complaint  Patient presents with  . Hallucinations      Diagnoses:  Final diagnoses:  Bipolar disorder with psychotic features ([place])  GAD (generalized anxiety disorder)  Cellulitis of foot    HPI: Catherine Kelley is a 69 y.o. female with a history of bipolar disorder, depression, and GAD who presents voluntarily to [facility] with her husband at the request of Dr. Adele Schilder due to visual hallucinations. On evaluation, patient is very pleasant.  She is alert and oriented x4.  Her speech is clear and coherent, fast but not pressured.  Patient reports that she has been having a visual hallucination of her mother who passed away 11 years ago.  She states that the hallucinations started a few days ago.  She states "I miss my mother.  I love her.  But, she is dead and I do not want her in my house."  She goes on to state "when my mother passed away she was in a wheelchair, this woman is now walking around my kitchen."  She states that she realizes that her mother is deceased and that the hallucination is not real.  She denies any auditory hallucinations.  She does not appear to be responding to any internal stimuli.  She denies suicidal ideations.  She denies homicidal ideations.  She reports some periods of depression and anxiety.  She states that the hallucinations of her mother is causing her to feel anxious.  Patient is pleasant throughout the assessment, she shares memories of her mother, and laughs appropriately at times.  Patient reports that she has been missing doses of her medications the last few days.  She states "I mean to take them when I am getting ready for bed, but when I wake up they are still sitting there, then I must not be taking them.     PHQ 2-9:  [place] Office Visit from 10/11/2019 in Primary Care at [facility] from 03/11/2019 in [place] at  [place] from 10/15/2018 in Primary Care at [place] that you would be better off dead, or of hurting yourself in some way Not at all Not at all Not at all  PHQ-9 Total Score '8 4 9      ' Flowsheet Row ED to Hosp-Admission (Discharged) from 10/21/2019 in [place] No Risk       Total Time spent with patient: 45 minutes  Musculoskeletal  Strength & Muscle Tone: within normal limits Gait & Station: normal Patient leans: N/A  Psychiatric Specialty Exam  Presentation General Appearance: Casual; Fairly Groomed  Eye Contact:Fair  Speech:Clear and Coherent; Normal Rate  Speech Volume:Normal  Handedness:Right   Mood and Affect  Mood:Anxious; Depressed  Affect:Congruent   Thought Process  Thought Processes:Coherent  Descriptions of Associations:Intact  Orientation:Full (Time, Place and Person)  Thought Content:Logical  Hallucinations:Hallucinations: Visual Description of Visual Hallucinations: reports visual halluciantion of seeing her mother in her house  Ideas of Reference:None  Suicidal Thoughts:Suicidal Thoughts: No  Homicidal Thoughts:Homicidal Thoughts: No   Sensorium  Memory:Immediate Fair; Recent Fair; Remote Fair  Judgment:Intact  Insight:Fair   Executive Functions  Concentration:Fair  Attention Span:Fair  [place]  Language:Good   Psychomotor Activity  Psychomotor Activity:Psychomotor Activity: Restlessness   Assets  Assets:Communication Skills; Desire for Improvement; Financial Resources/Insurance; Housing   Sleep  Sleep:Sleep: Fair  Physical Exam Constitutional:      General: She is not in acute distress.    Appearance: She is not ill-appearing, toxic-appearing or diaphoretic.  HENT:     Head: Normocephalic.     Right Ear: External ear normal.     Left Ear: External ear normal.  Eyes:     Conjunctiva/sclera: Conjunctivae normal.      Pupils: Pupils are equal, round, and reactive to light.  Cardiovascular:     Rate and Rhythm: Normal rate.  Pulmonary:     Effort: Pulmonary effort is normal. No respiratory distress.  Musculoskeletal:        General: Normal range of motion.  Skin:    General: Skin is warm and dry.       Neurological:     General: No focal deficit present.     Mental Status: She is alert and oriented to person, place, and time.  Psychiatric:        Mood and Affect: Mood is anxious and depressed.        Behavior: Behavior is cooperative.        Thought Content: Thought content is not paranoid or delusional. Thought content does not include homicidal or suicidal ideation. Thought content does not include suicidal plan.    Review of Systems  Constitutional: Negative for chills, diaphoresis, fever, malaise/fatigue and weight loss.  Respiratory: Negative for cough and shortness of breath.   Cardiovascular: Negative for chest pain and palpitations.  Gastrointestinal: Negative for diarrhea, nausea and vomiting.  Genitourinary: Negative for dysuria.  Skin: Positive for rash.  Neurological: Negative for dizziness and focal weakness.  Psychiatric/Behavioral: Positive for depression and hallucinations. Negative for memory loss, substance abuse and suicidal ideas. The patient is nervous/anxious and has insomnia.     Blood pressure 135/66, pulse 82, temperature (!) 97.1 F (36.2 C), resp. rate 16, SpO2 93 %. There is no height or weight on file to calculate BMI.  Past Psychiatric History: Bipolar Disorder, GAD, MDD  Is the patient at risk to self? No  Has the patient been a risk to self in the past 6 months? No .    Has the patient been a risk to self within the distant past? No   Is the patient a risk to others? No   Has the patient been a risk to others in the past 6 months? No   Has the patient been a risk to others within the distant past? No   Past Medical History:  Past Medical History:   Diagnosis Date  . Anxiety   . Arthritis    "jooints" (09/18/2017)  . Asthma   . Bipolar disorder ([place])   . Bipolar I disorder ([place]) 10/11/2019  . CAP (community acquired pneumonia) 09/18/2017  . Cat allergies   . Chronic bronchitis ([place])    "get it alot; maybe not q yr" (07/06/2014)  . Chronic lower back pain    "spurs and pinched nerves" (09/18/2017)  . COPD (chronic obstructive pulmonary disease) ([place])   . DDD (degenerative disc disease), lumbar   . Depression    psychiatrist Dr. Toy Care  . Diastolic dysfunction   . Emphysematous COPD ([place])    changes in right base along with patchy areas on last x-ray  . Fall as cause of accidental injury at home as place of occurrence 10/11/2019  . Fall involving wheelchair as cause of accidental injury 10/11/2019  . Falls frequently    "several in 2017; fell at dr's office yesterday" (09/18/2017)  .  GERD (gastroesophageal reflux disease)   . HCAP (healthcare-associated pneumonia) 02/12/2016  . Heart murmur   . History of cardiovascular stress test 08/15/2004   EF of 70% -- Normal stress cardiolite.  There is no evidence of ischemia and there is normal LV function. -- [name]. MD  . History of echocardiogram 12/24/2006   Est. EF of 70-01% NORMAL LV SYSTOLIC FUNCTION WITH IMPAIRED RELAXATION -- MILD AORTIC SCLEROSIS -- NORMAL PALONARY ARTERY PRESSURE -- NO OLD ECHOS FOR COMPARISON -- [name], MD  . Hypertension    essential hypertension  . Migraine    "when I was having my periods; none since then" (09/18/2017)  . Necrotic pneumonia ([place])    recurrent/notes 02/12/2016  . Pneumonia    "several times since May 2015" (07/06/2014)  . Pollen allergies   . Tobacco abuse    smoking about 1/2 pk of cigarettes a day  . Tobacco dependence due to cigarettes 10/11/2019  . Traumatic hematoma of knee, right, initial encounter 10/11/2019    Past Surgical History:  Procedure Laterality Date  . CAROTID STENT    . DILATION AND CURETTAGE  OF UTERUS    . TUBAL LIGATION  1986    Family History:  Family History  Problem Relation Age of Onset  . Stroke Mother   . Heart disease Father   . Hyperlipidemia Father   . Hypertension Father   . Breast cancer Maternal Aunt   . Asthma Maternal Grandmother     Social History:  Social History   Socioeconomic History  . Marital status: Married    Spouse name: richard  . Number of children: 2  . Years of education: Not on file  . Highest education level: Associate degree: occupational, Hotel manager, or vocational program  Occupational History  . Occupation: Chemical engineer: [employer]    Comment: [employer]  Tobacco Use  . Smoking status: Current Every Day Smoker    Packs/day: 0.50    Years: 46.00    Pack years: 23.00    Types: Cigarettes  . Smokeless tobacco: Never Used  Vaping Use  . Vaping Use: Never used  Substance and Sexual Activity  . Alcohol use: Yes    Alcohol/week: 0.0 standard drinks    Comment: 09/18/2017 "might have 1 drink/month; if that"  . Drug use: No  . Sexual activity: Not Currently  Other Topics Concern  . Not on file  Social History Narrative   Patient currently lives with her husband.    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 No bird or hot tub exposure. No mold exposure. No recent travel.   Social Determinants of Health   Financial Resource Strain: Not on file  Food Insecurity: Not on file  Transportation Needs: Not on file  Physical Activity: Not on file  Stress: Not on file  Social Connections: Not on file  Intimate Partner Violence: Not on file    SDOH:  SDOH Screenings   Alcohol Screen: Not on file  Depression (PHQ2-9): Low Risk   . PHQ-2 Score: 0  Financial Resource Strain: Not on file  Food Insecurity: Not on file  Housing: Not on file  Physical Activity: Not on file  Social Connections: Not on file  Stress: Not on file   Tobacco Use: High Risk  . Smoking Tobacco Use: Current Every Day Smoker  . Smokeless  Tobacco Use: Never Used  Transportation Needs: Not on file    Last Labs:  Admission on 11/08/2020  Component Date Value Ref Range Status  . SARS Coronavirus 2 Ag 11/08/2020 Negative  Negative Preliminary  Office Visit on 09/21/2020  Component Date Value Ref Range Status  . Color, UA 09/21/2020 yellow  yellow Final  . Clarity, UA 09/21/2020 clear  clear Final  . Glucose, UA 09/21/2020 negative  negative mg/dL Final  . Bilirubin, UA 09/21/2020 negative  negative Final  . Ketones, POC UA 09/21/2020 negative  negative mg/dL Final  . Spec Grav, UA 09/21/2020 1.020  1.010 - 1.025 Final  . Blood, UA 09/21/2020 negative  negative Final  . pH, UA 09/21/2020 6.0  5.0 - 8.0 Final  . Protein Ur, POC 09/21/2020 negative  negative mg/dL Final  . Urobilinogen, UA 09/21/2020 1.0  0.2 or 1.0 E.U./dL Final  . Nitrite, UA 09/21/2020 Positive* Negative Final  . Leukocytes, UA 09/21/2020 Trace* Negative Final  . Urine Culture, Routine 09/21/2020 Final report   Final  . Organism ID, Bacteria 09/21/2020 Comment   Final   Comment: Culture shows less than 10,000 colony forming units of bacteria per milliliter of urine. This colony count is not generally considered to be clinically significant.   Office Visit on 07/06/2020  Component Date Value Ref Range Status  . Cholesterol, Total 07/06/2020 169  100 - 199 mg/dL Final  . Triglycerides 07/06/2020 95  0 - 149 mg/dL Final  . HDL 07/06/2020 66  >39 mg/dL Final  . VLDL Cholesterol Cal 07/06/2020 17  5 - 40 mg/dL Final  . LDL Chol Calc (NIH) 07/06/2020 86  0 - 99 mg/dL Final  . Chol/HDL Ratio 07/06/2020 2.6  0.0 - 4.4 ratio Final   Comment:                                   T. Chol/HDL Ratio                                             Men  Women                               1/2 Avg.Risk  3.4    3.3                                   Avg.Risk  5.0    4.4                                 2X Avg.Risk  9.6    7.1                                3X Avg.Risk 23.4   11.0   . Color, UA 07/06/2020 yellow  yellow Final  . Clarity, UA 07/06/2020 clear  clear Final  . Glucose, UA 07/06/2020 negative  negative mg/dL Final  . Bilirubin, UA 07/06/2020 negative  negative Final  . Ketones, POC UA 07/06/2020 negative  negative mg/dL Final  . Spec Grav, UA 07/06/2020 1.015  1.010 - 1.025 Final  . Blood, UA 07/06/2020 negative  negative Final  . pH, UA 07/06/2020 6.0  5.0 - 8.0 Final  . Protein Ur, POC 07/06/2020 negative  negative mg/dL Final  . Urobilinogen, UA 07/06/2020 0.2  0.2 or 1.0 E.U./dL Final  . Nitrite, UA 07/06/2020 Negative  Negative Final  . Leukocytes, UA 07/06/2020 Large (3+)* Negative Final  . Urine Culture, Routine 07/06/2020 Final report   Final  . Organism ID, Bacteria 07/06/2020 Comment   Final   Comment: Mixed urogenital flora Less than 10,000 colonies/mL   . Vit D, 25-Hydroxy 07/06/2020 18.1* 30.0 - 100.0 ng/mL Final   Comment: Vitamin D deficiency has been defined by the [place] practice guideline as a level of serum 25-OH vitamin D less than 20 ng/mL (1,2). The Endocrine Society went on to further define vitamin D insufficiency as a level between 21 and 29 ng/mL (2). 1. IOM (Institute of Medicine). 2010. Dietary reference    intakes for calcium and D. [place]: The    [employer]. 2. Holick MF, Binkley Castine, Bischoff-Ferrari HA, et al.    Evaluation, treatment, and prevention of vitamin D    deficiency: an Endocrine Society clinical practice    guideline. JCEM. 2011 Jul; 96(7):1911-30.   [name] Glucose 07/06/2020 100* 65 - 99 mg/dL Final  . BUN 07/06/2020 7* 8 - 27 mg/dL Final  . Creatinine, Ser 07/06/2020 0.60  0.57 - 1.00 mg/dL Final  . GFR calc non Af Amer 07/06/2020 93  >59 mL/min/1.73 Final  . GFR calc Af Amer 07/06/2020 108  >59 mL/min/1.73 Final   Comment: **[facility] currently reports  eGFR in compliance with the current**   recommendations of the [employer]. [facility] will   update reporting as new guidelines are published from the NKF-ASN   Task force.   . BUN/Creatinine Ratio 07/06/2020 12  12 - 28 Final  . Sodium 07/06/2020 136  134 - 144 mmol/L Final  . Potassium 07/06/2020 4.3  3.5 - 5.2 mmol/L Final  . Chloride 07/06/2020 102  96 - 106 mmol/L Final  . CO2 07/06/2020 21  20 - 29 mmol/L Final  . Calcium 07/06/2020 9.5  8.7 - 10.3 mg/dL Final    Allergies: Sulfa drugs cross reactors, Ceclor [cefaclor], Depakote er [divalproex sodium er], Escitalopram oxalate, Latex, Sertraline hcl, and Tape  PTA Medications: (Not in a hospital admission)   Medical Decision Making  [facility] admission labs placed  Check lithium level  Lithium level elevated, hold lithium   Continue home medications -Nortriptyline 50 mg QHS -Effexor-XR 75 mg daily for depression -Hydroxyzine 10 mg QHS prn for anxiety -ASA 81 mg daily -Norvasc 2.5 mg daily for HTN   Start doxycycline 100 mg BID x 10 days for cellulitis   Clinical Course as of 11/08/20 0558  Thu Nov 08, 2020  0533 CBC with Differential/Platelet(!) CBC unremarkable [JB]  0533 Hemoglobin A1C: 4.8 [JB]  0555 Lithium(!!): 1.51 Lithium level slightly elevated. Patient denies N/V/D. She is alert and oriented x 4. No focal neurological deficits. VSS. Will repeat lithium level and hold lithium.  [JB]    Clinical Course User Index [JB] Rozetta Nunnery, NP    Recommendations  Due to the patient's age, new onset visual hallucinations, and elevated lithium level. I feel the patient should be evaluated and monitored in the emergency department for medical clearance and reevaluation by psychiatry.   Rozetta Nunnery, NP 11/08/20  2:43 AM

## 2020-11-08 NOTE — ED Notes (Signed)
EMS at bedside to transfer pt to [facility].

## 2020-11-08 NOTE — ED Notes (Signed)
TTS in process 

## 2020-11-08 NOTE — ED Provider Notes (Signed)
FBC/OBS ASAP Discharge Summary  Date and Time: 11/08/2020 6:19 AM  Name: Catherine Kelley  MRN:  [id number]   Discharge Diagnoses:  Final diagnoses:  Bipolar disorder with psychotic features ([place])  GAD (generalized anxiety disorder)  Cellulitis of foot   Catherine Kelley is a 69 y.o. female with a history of bipolar disorder, depression, and GAD who presents voluntarily to [facility] with her husband at the request of Dr. Adele Schilder due to visual hallucinations. On evaluation, patient is very pleasant.  She is alert and oriented x4.  Her speech is clear and coherent, fast but not pressured.  Patient reports that she has been having a visual hallucination of her mother who passed away 11 years ago.  She states that the hallucinations started a few days ago.  She states "I miss my mother.  I love her.  But, she is dead and I do not want her in my house."  She goes on to state "when my mother passed away she was in a wheelchair, this woman is now walking around my kitchen."  She states that she realizes that her mother is deceased and that the hallucination is not real.  She denies any auditory hallucinations.  She does not appear to be responding to any internal stimuli.  She denies suicidal ideations.  She denies homicidal ideations.  She reports some periods of depression and anxiety.  She states that the hallucinations of her mother is causing her to feel anxious.  Patient is pleasant throughout the assessment, she shares memories of her mother, and laughs appropriately at times.  Patient reports that she has been missing doses of her medications the last few days.  She states "I mean to take them when I am getting ready for bed, but when I wake up they are still sitting there, then I must not be taking them.    Due to the patient's age, new onset visual hallucinations, and elevated lithium level. I feel the patient should be evaluated and monitored in the emergency department for medical clearance and reevaluation by  psychiatry.     Past Medical History:  Past Medical History:  Diagnosis Date  . Anxiety   . Arthritis    "jooints" (09/18/2017)  . Asthma   . Bipolar disorder ([place])   . Bipolar I disorder ([place]) 10/11/2019  . CAP (community acquired pneumonia) 09/18/2017  . Cat allergies   . Chronic bronchitis ([place])    "get it alot; maybe not q yr" (07/06/2014)  . Chronic lower back pain    "spurs and pinched nerves" (09/18/2017)  . COPD (chronic obstructive pulmonary disease) ([place])   . DDD (degenerative disc disease), lumbar   . Depression    psychiatrist Dr. Toy Care  . Diastolic dysfunction   . Emphysematous COPD ([place])    changes in right base along with patchy areas on last x-ray  . Fall as cause of accidental injury at home as place of occurrence 10/11/2019  . Fall involving wheelchair as cause of accidental injury 10/11/2019  . Falls frequently    "several in 2017; fell at dr's office yesterday" (09/18/2017)  . GERD (gastroesophageal reflux disease)   . HCAP (healthcare-associated pneumonia) 02/12/2016  . Heart murmur   . History of cardiovascular stress test 08/15/2004   EF of 70% -- Normal stress cardiolite.  There is no evidence of ischemia and there is normal LV function. -- [name]. MD  . History of echocardiogram 12/24/2006   Est. EF of 55-60% NORMAL LV  SYSTOLIC FUNCTION WITH IMPAIRED RELAXATION -- MILD AORTIC SCLEROSIS -- NORMAL PALONARY ARTERY PRESSURE -- NO OLD ECHOS FOR COMPARISON -- [name], MD  . Hypertension    essential hypertension  . Migraine    "when I was having my periods; none since then" (09/18/2017)  . Necrotic pneumonia ([place])    recurrent/notes 02/12/2016  . Pneumonia    "several times since May 2015" (07/06/2014)  . Pollen allergies   . Tobacco abuse    smoking about 1/2 pk of cigarettes a day  . Tobacco dependence due to cigarettes 10/11/2019  . Traumatic hematoma of knee, right, initial encounter 10/11/2019    Past Surgical History:   Procedure Laterality Date  . CAROTID STENT    . DILATION AND CURETTAGE OF UTERUS    . TUBAL LIGATION  1986   Family History:  Family History  Problem Relation Age of Onset  . Stroke Mother   . Heart disease Father   . Hyperlipidemia Father   . Hypertension Father   . Breast cancer Maternal Aunt   . Asthma Maternal Grandmother     Social History:  Social History   Substance and Sexual Activity  Alcohol Use Yes  . Alcohol/week: 0.0 standard drinks   Comment: 09/18/2017 "might have 1 drink/month; if that"     Social History   Substance and Sexual Activity  Drug Use No    Social History   Socioeconomic History  . Marital status: Married    Spouse name: richard  . Number of children: 2  . Years of education: Not on file  . Highest education level: Associate degree: occupational, Hotel manager, or vocational program  Occupational History  . Occupation: Chemical engineer: [employer]    Comment: [employer]  Tobacco Use  . Smoking status: Current Every Day Smoker    Packs/day: 0.50    Years: 46.00    Pack years: 23.00    Types: Cigarettes  . Smokeless tobacco: Never Used  Vaping Use  . Vaping Use: Never used  Substance and Sexual Activity  . Alcohol use: Yes    Alcohol/week: 0.0 standard drinks    Comment: 09/18/2017 "might have 1 drink/month; if that"  . Drug use: No  . Sexual activity: Not Currently  Other Topics Concern  . Not on file  Social History Narrative   Patient currently lives with her husband.    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 No bird or hot tub exposure. No mold exposure. No recent travel.   Social Determinants of Health   Financial Resource Strain: Not on file  Food Insecurity: Not on file  Transportation Needs: Not on file  Physical Activity: Not on file  Stress: Not on file  Social Connections: Not on file   SDOH:  SDOH Screenings    Alcohol Screen: Not on file  Depression (PHQ2-9): Low Risk   . PHQ-2 Score: 0  Financial Resource Strain: Not on file  Food Insecurity: Not on file  Housing: Not on file  Physical Activity: Not on file  Social Connections: Not on file  Stress: Not on file  Tobacco Use: High Risk  . Smoking Tobacco Use: Current Every Day Smoker  . Smokeless Tobacco Use: Never Used  Transportation Needs: Not on file    Has this patient used any form of tobacco in the last 30 days? (Cigarettes,  Smokeless Tobacco, Cigars, and/or Pipes) Prescription not provided because: patient transferred to ED  Current Medications:  Current Facility-Administered Medications  Medication Dose Route Frequency Provider Last Rate Last Admin  . acetaminophen (TYLENOL) tablet 650 mg  650 mg Oral Q6H PRN Lindon Romp A, NP      . alum & mag hydroxide-simeth (MAALOX/MYLANTA) 200-200-20 MG/5ML suspension 30 mL  30 mL Oral Q4H PRN Lindon Romp A, NP      . amLODipine (NORVASC) tablet 2.5 mg  2.5 mg Oral Daily Lindon Romp A, NP      . aspirin EC tablet 81 mg  81 mg Oral Daily Lindon Romp A, NP      . doxycycline (VIBRA-TABS) tablet 100 mg  100 mg Oral Q12H Lindon Romp A, NP   100 mg at 11/08/20 0240  . hydrOXYzine (ATARAX/VISTARIL) tablet 10 mg  10 mg Oral QHS PRN Lindon Romp A, NP   10 mg at 11/08/20 0240  . magnesium hydroxide (MILK OF MAGNESIA) suspension 30 mL  30 mL Oral Daily PRN Lindon Romp A, NP      . nortriptyline (PAMELOR) capsule 50 mg  50 mg Oral QHS Lindon Romp A, NP   50 mg at 11/08/20 0240  . venlafaxine XR (EFFEXOR-XR) 24 hr capsule 75 mg  75 mg Oral Q breakfast Rozetta Nunnery, NP       Current Outpatient Medications  Medication Sig Dispense Refill  . Acetaminophen (TYLENOL ARTHRITIS PAIN PO) Take by mouth.    [name] albuterol (PROVENTIL) (2.5 MG/3ML) 0.083% nebulizer solution Take 2.5 mg by nebulization every 6 (six) hours as needed for wheezing or shortness of breath.    [name] albuterol (VENTOLIN HFA) 108 (90  Base) MCG/ACT inhaler Inhale 2 puffs into the lungs every 6 (six) hours as needed for wheezing or shortness of breath. 18 g 11  . alendronate (FOSAMAX) 70 MG tablet Take 1 tablet (70 mg total) by mouth every 7 (seven) days. Take with a full glass of water on an empty stomach. 4 tablet 11  . amLODipine (NORVASC) 2.5 MG tablet TAKE 1 TABLET BY MOUTH EVERY DAY 90 tablet 1  . aspirin EC 81 MG tablet Take 81 mg by mouth daily as needed.     . doxycycline (VIBRA-TABS) 100 MG tablet Take 1 tablet (100 mg total) by mouth 2 (two) times daily. 20 tablet 0  . fluticasone (FLONASE) 50 MCG/ACT nasal spray Place 2 sprays into both nostrils daily. 16 g 6  . HYDROcodone-acetaminophen (NORCO/VICODIN) 5-325 MG tablet Take 1 tablet by mouth every 6 (six) hours as needed for moderate pain. 30 tablet 0  . hydrOXYzine (ATARAX/VISTARIL) 10 MG tablet Take 1 tablet (10 mg total) by mouth at bedtime as needed for anxiety (sleep). 90 tablet 0  . lithium carbonate (ESKALITH) 450 MG CR tablet Take 1 tablet (450 mg total) by mouth 2 (two) times daily. 180 tablet 0  . methocarbamol (ROBAXIN) 500 MG tablet TAKE 1 TABLET (500 MG TOTAL) BY MOUTH EVERY 8 (EIGHT) HOURS AS NEEDED FOR MUSCLE SPASMS. 30 tablet 1  . nitrofurantoin, macrocrystal-monohydrate, (MACROBID) 100 MG capsule Take 1 capsule (100 mg total) by mouth 2 (two) times daily. 14 capsule 0  . nortriptyline (PAMELOR) 50 MG capsule Take 1 capsule (50 mg total) by mouth at bedtime. 90 capsule 0  . predniSONE (DELTASONE) 20 MG tablet Take 2 tablets (40 mg total) by mouth daily with breakfast. 10 tablet 0  . spironolactone (ALDACTONE) 50 MG tablet TAKE 1 TABLET (50  MG TOTAL) BY MOUTH DAILY. OFFICE VISIT NEEDED FOR REFILLS 90 tablet 0  . traMADol (ULTRAM) 50 MG tablet Take 1 at bedtime as needed for low back pain 10 tablet 0  . triamcinolone cream (KENALOG) 0.1 % Apply 1 application topically 2 (two) times daily. 30 g 1  . venlafaxine (EFFEXOR) 75 MG tablet Take 1 tablet (75 mg  total) by mouth daily. 90 tablet 0  . Vitamin D, Ergocalciferol, (DRISDOL) 1.25 MG (50000 UNIT) CAPS capsule Take 1 capsule (50,000 Units total) by mouth every 7 (seven) days. 12 capsule 0    PTA Medications: (Not in a hospital admission)   Musculoskeletal  Strength & Muscle Tone: within normal limits Gait & Station: normal Patient leans: N/A  Psychiatric Specialty Exam  Presentation  General Appearance: Casual; Fairly Groomed  Eye Contact:Fair  Speech:Clear and Coherent; Other (comment) (speech is fast, but not pressured)  Speech Volume:Normal  Handedness:Right   Mood and Affect  Mood:Anxious; Depressed  Affect:Congruent; Other (comment) (laughs appropriately)   Thought Process  Thought Processes:Coherent  Descriptions of Associations:Intact  Orientation:Full (Time, Place and Person)  Thought Content:Logical  Hallucinations:Hallucinations: Visual Description of Visual Hallucinations: reports visual halluciantion of seeing her mother in her house  Ideas of Reference:None  Suicidal Thoughts:Suicidal Thoughts: No  Homicidal Thoughts:Homicidal Thoughts: No   Sensorium  Memory:Immediate Fair; Recent Fair; Remote Fair  Judgment:Intact  Insight:Fair   Executive Functions  Concentration:Fair  Attention Span:Fair  [place]  Language:Good   Psychomotor Activity  Psychomotor Activity:Psychomotor Activity: Restlessness   Assets  Assets:Communication Skills; Desire for Improvement; Financial Resources/Insurance; Housing   Sleep  Sleep:Sleep: Fair   Physical Exam  Blood pressure 135/66, pulse 82, temperature (!) 97.1 F (36.2 C), resp. rate 16, SpO2 93 %. There is no height or weight on file to calculate BMI.    Disposition: Due to the patient's age, new onset visual hallucinations, and elevated lithium level. I feel the patient should be evaluated and monitored in the emergency department for medical clearance and  reevaluation by psychiatry.    Rozetta Nunnery, NP 11/08/2020, 6:19 AM

## 2020-11-08 NOTE — ED Triage Notes (Addendum)
BIB [facility] from [facility]. Pt presented to [facility] d/t having hallucinations and experiencing insomnia. Upon their exam pt was found to have elevated Lithium levels at 1.51. Pt noted to be AOx4. VS stable w/ EMS. Pt complaining of a productive cough w/ hx of COPD, and cellulitis for the L foot that is bothering her. Pt endorses her hallucinations include seeing her mom in her home , but her mom died around 11 years ago. Pt denies SI/HI at this time.

## 2020-11-08 NOTE — ED Notes (Signed)
Pt transported to xray 

## 2020-11-08 NOTE — ED Provider Notes (Signed)
Care of the patient assumed at the change of shift. Here with visual hallucinations, mildly elevated lithium level at [facility]. Observed in the ED with gradually improving Li level. Reassessed by TTS/Psych who has cleared her for discharge home with new instructions for her Lithium doses as follows: hold the lithium until tomorrow night, then start back lithium 450 mg QHS x 3 days, then lithium 450 mg BID    Truddie Hidden, MD 11/08/20 [phone]

## 2020-11-08 NOTE — ED Notes (Signed)
Pt sleeping at present, no distress noted, monitoring for safety. 

## 2020-11-08 NOTE — ED Provider Notes (Signed)
[facility] EMERGENCY DEPARTMENT Provider Note   CSN: [id number] Arrival date & time: 11/08/20  7026     History No chief complaint on file.   Catherine Kelley is a 69 y.o. female.  Patient sent over from behavioral health urgent care.  Patient was evaluated there for about 6 hours.  Covid testing was negative.  But she had abnormally high lithium level and also had an elevated thyroid-stimulating hormone.  In addition there was concerns about may be a left foot cellulitis.  Patient also had the complaint of a productive cough but she has a history of COPD.  Patient has had pneumonias in the past.  Main concern was that patient has not been sleeping.  She has a history of bipolar disorder.  And patient has been having visual hallucinations of seeing her deceased mother.  But no conversations.  Her mother died about 11 years ago.  Patient denies any suicidal or homicidal ideation.  Patient CBC complete metabolic panel done at the behavioral health urgent care without any significant abnormalities        Past Medical History:  Diagnosis Date  . Anxiety   . Arthritis    "jooints" (09/18/2017)  . Asthma   . Bipolar disorder ([place])   . Bipolar I disorder ([place]) 10/11/2019  . CAP (community acquired pneumonia) 09/18/2017  . Cat allergies   . Chronic bronchitis ([place])    "get it alot; maybe not q yr" (07/06/2014)  . Chronic lower back pain    "spurs and pinched nerves" (09/18/2017)  . COPD (chronic obstructive pulmonary disease) ([place])   . DDD (degenerative disc disease), lumbar   . Depression    psychiatrist Dr. Toy Care  . Diastolic dysfunction   . Emphysematous COPD ([place])    changes in right base along with patchy areas on last x-ray  . Fall as cause of accidental injury at home as place of occurrence 10/11/2019  . Fall involving wheelchair as cause of accidental injury 10/11/2019  . Falls frequently    "several in 2017; fell at dr's office yesterday" (09/18/2017)  . GERD  (gastroesophageal reflux disease)   . HCAP (healthcare-associated pneumonia) 02/12/2016  . Heart murmur   . History of cardiovascular stress test 08/15/2004   EF of 70% -- Normal stress cardiolite.  There is no evidence of ischemia and there is normal LV function. -- [name]. MD  . History of echocardiogram 12/24/2006   Est. EF of 37-85% NORMAL LV SYSTOLIC FUNCTION WITH IMPAIRED RELAXATION -- MILD AORTIC SCLEROSIS -- NORMAL PALONARY ARTERY PRESSURE -- NO OLD ECHOS FOR COMPARISON -- [name], MD  . Hypertension    essential hypertension  . Migraine    "when I was having my periods; none since then" (09/18/2017)  . Necrotic pneumonia ([place])    recurrent/notes 02/12/2016  . Pneumonia    "several times since May 2015" (07/06/2014)  . Pollen allergies   . Tobacco abuse    smoking about 1/2 pk of cigarettes a day  . Tobacco dependence due to cigarettes 10/11/2019  . Traumatic hematoma of knee, right, initial encounter 10/11/2019    Patient Active Problem List   Diagnosis Date Noted  . Vitamin D deficiency 07/06/2020  . Cellulitis of right lower extremity 10/22/2019  . Cellulitis 10/21/2019  . Bipolar I disorder ([place]) 10/11/2019  . Tobacco dependence due to cigarettes 10/11/2019  . Fall as cause of accidental injury at home as place of occurrence 10/11/2019  . Traumatic hematoma of  knee, right, initial encounter 10/11/2019  . Lower respiratory infection 12/22/2018  . Senile ecchymosis 09/11/2018  . CAP (community acquired pneumonia) 09/17/2017  . Near syncope 09/17/2017  . Tobacco abuse 02/12/2016  . Other fatigue 02/04/2016  . Respiratory failure with hypoxia ([place]) 01/09/2016  . Malnutrition of moderate degree 11/03/2015  . Pulmonary emphysema ([place])   . Essential hypertension 09/27/2015  . Rhabdomyolysis 08/16/2015  . Cough 06/07/2015  . Bronchiectasis with acute exacerbation ([place]) 03/07/2015  . Cigarette smoker 12/13/2014  . Stage 2 moderate COPD by GOLD  classification ([place]) 10/11/2014  . Lumbar back pain with radiculopathy affecting right lower extremity 09/28/2014  . Hyponatremia 07/09/2014  . Hx of recurrent pneumonia 07/08/2014  . Venous insufficiency (chronic) (peripheral) 09/29/2012  . Lower extremity edema 03/09/2012  . Benign hypertensive heart disease without heart failure 05/09/2011  . Chest pain 05/09/2011  . Dyslipidemia 05/09/2011  . Mood disorder ([place]) 05/09/2011  . Osteoarthritis 05/09/2011    Past Surgical History:  Procedure Laterality Date  . CAROTID STENT    . DILATION AND CURETTAGE OF UTERUS    . TUBAL LIGATION  1986     OB History   No obstetric history on file.     Family History  Problem Relation Age of Onset  . Stroke Mother   . Heart disease Father   . Hyperlipidemia Father   . Hypertension Father   . Breast cancer Maternal Aunt   . Asthma Maternal Grandmother     Social History   Tobacco Use  . Smoking status: Current Every Day Smoker    Packs/day: 0.50    Years: 46.00    Pack years: 23.00    Types: Cigarettes  . Smokeless tobacco: Never Used  Vaping Use  . Vaping Use: Never used  Substance Use Topics  . Alcohol use: Yes    Alcohol/week: 0.0 standard drinks    Comment: 09/18/2017 "might have 1 drink/month; if that"  . Drug use: No    Home Medications Prior to Admission medications   Medication Sig Start Date End Date Taking? Authorizing Provider  Acetaminophen (TYLENOL ARTHRITIS PAIN PO) Take by mouth.    [provider]  albuterol (PROVENTIL) (2.5 MG/3ML) 0.083% nebulizer solution Take 2.5 mg by nebulization every 6 (six) hours as needed for wheezing or shortness of breath.    [provider]  albuterol (VENTOLIN HFA) 108 (90 Base) MCG/ACT inhaler Inhale 2 puffs into the lungs every 6 (six) hours as needed for wheezing or shortness of breath. 01/06/20   Jacelyn Pi, Lilia Argue, MD  alendronate (FOSAMAX) 70 MG tablet Take 1 tablet (70 mg total) by mouth every 7 (seven)  days. Take with a full glass of water on an empty stomach. 01/13/20   Wendall Mola, NP  amLODipine (NORVASC) 2.5 MG tablet TAKE 1 TABLET BY MOUTH EVERY DAY 07/06/20   Jacelyn Pi, Lilia Argue, MD  aspirin EC 81 MG tablet Take 81 mg by mouth daily as needed.     [provider]  fluticasone (FLONASE) 50 MCG/ACT nasal spray Place 2 sprays into both nostrils daily. 07/15/19   Maximiano Coss, NP  hydrOXYzine (ATARAX/VISTARIL) 10 MG tablet Take 1 tablet (10 mg total) by mouth at bedtime as needed for anxiety (sleep). 10/30/20   Arfeen, Arlyce Harman, MD  lithium carbonate (ESKALITH) 450 MG CR tablet Take 1 tablet (450 mg total) by mouth 2 (two) times daily. 10/30/20 10/30/21  Arfeen, Arlyce Harman, MD  nortriptyline (PAMELOR) 50 MG capsule Take 1  capsule (50 mg total) by mouth at bedtime. 10/30/20   Arfeen, Arlyce Harman, MD  triamcinolone cream (KENALOG) 0.1 % Apply 1 application topically 2 (two) times daily. 10/18/19   Wendall Mola, NP  venlafaxine (EFFEXOR) 75 MG tablet Take 1 tablet (75 mg total) by mouth daily. 10/30/20 10/30/21  Arfeen, Arlyce Harman, MD  Vitamin D, Ergocalciferol, (DRISDOL) 1.25 MG (50000 UNIT) CAPS capsule Take 1 capsule (50,000 Units total) by mouth every 7 (seven) days. 07/31/20   Daleen Squibb, MD    Allergies    Sulfa drugs cross reactors, Ceclor [cefaclor], Depakote er [divalproex sodium er], Escitalopram oxalate, Latex, Sertraline hcl, and Tape  Review of Systems   Review of Systems  Constitutional: Negative for chills and fever.  HENT: Negative for rhinorrhea and sore throat.   Eyes: Negative for visual disturbance.  Respiratory: Positive for cough. Negative for shortness of breath.   Cardiovascular: Negative for chest pain and leg swelling.  Gastrointestinal: Negative for abdominal pain, diarrhea, nausea and vomiting.  Genitourinary: Negative for dysuria.  Musculoskeletal: Negative for back pain and neck pain.  Skin: Negative for rash.  Neurological: Negative for  dizziness, light-headedness and headaches.  Hematological: Does not bruise/bleed easily.  Psychiatric/Behavioral: Positive for hallucinations and sleep disturbance. Negative for confusion and suicidal ideas.    Physical Exam Updated Vital Signs BP (!) 104/59   Pulse 85   Temp 98.1 F (36.7 C) (Oral)   Resp 17   Ht 1.702 m (_0 )   Wt 52 kg   SpO2 92%   BMI 17.96 kg/m   Physical Exam Vitals and nursing note reviewed.  Constitutional:      General: She is not in acute distress.    Appearance: Normal appearance. She is well-developed and well-nourished.  HENT:     Head: Normocephalic and atraumatic.  Eyes:     Extraocular Movements: Extraocular movements intact.     Conjunctiva/sclera: Conjunctivae normal.     Pupils: Pupils are equal, round, and reactive to light.  Cardiovascular:     Rate and Rhythm: Normal rate and regular rhythm.     Heart sounds: No murmur heard.   Pulmonary:     Effort: Pulmonary effort is normal. No respiratory distress.     Breath sounds: Normal breath sounds. No wheezing.  Abdominal:     Palpations: Abdomen is soft.     Tenderness: There is no abdominal tenderness.  Musculoskeletal:        General: No edema. Normal range of motion.     Cervical back: Normal range of motion and neck supple.     Comments: Some redness to bilateral feet.  Left greater than right.  Has some slight increased warmth.  Not consistent consistent with cellulitis.  Patient apparently been on her feet a lot.  No red streaking.  Good cap refill to the toes.  Skin:    General: Skin is warm and dry.     Capillary Refill: Capillary refill takes less than 2 seconds.  Neurological:     General: No focal deficit present.     Mental Status: She is alert and oriented to person, place, and time.     Cranial Nerves: No cranial nerve deficit.     Sensory: No sensory deficit.     Motor: No weakness.  Psychiatric:        Mood and Affect: Mood and affect normal.     ED Results  / Procedures / Treatments   Labs (all labs ordered  are listed, but only abnormal results are displayed) Labs Reviewed  LITHIUM LEVEL - Abnormal; Notable for the following components:      Result Value   Lithium Lvl 1.48 (*)    All other components within normal limits  LITHIUM LEVEL - Abnormal; Notable for the following components:   Lithium Lvl 1.37 (*)    All other components within normal limits  T4, FREE  ETHANOL  RAPID URINE DRUG SCREEN, HOSP PERFORMED  T3, FREE  URINALYSIS, ROUTINE W REFLEX MICROSCOPIC  T3, FREE     Results for orders placed or performed during the hospital encounter of 11/08/20  Lithium level  Result Value Ref Range   Lithium Lvl 1.48 (H) 0.60 - 1.20 mmol/L  T4, free  Result Value Ref Range   Free T4 1.07 0.61 - 1.12 ng/dL  Ethanol  Result Value Ref Range   Alcohol, Ethyl (B) <10 <10 mg/dL  Rapid urine drug screen (hospital performed)  Result Value Ref Range   Opiates NONE DETECTED NONE DETECTED   Cocaine NONE DETECTED NONE DETECTED   Benzodiazepines NONE DETECTED NONE DETECTED   Amphetamines NONE DETECTED NONE DETECTED   Tetrahydrocannabinol NONE DETECTED NONE DETECTED   Barbiturates NONE DETECTED NONE DETECTED  Lithium level  Result Value Ref Range   Lithium Lvl 1.37 (H) 0.60 - 1.20 mmol/L   Results for orders placed or performed during the hospital encounter of 11/08/20  Lithium level  Result Value Ref Range   Lithium Lvl 1.48 (H) 0.60 - 1.20 mmol/L  T4, free  Result Value Ref Range   Free T4 1.07 0.61 - 1.12 ng/dL  Ethanol  Result Value Ref Range   Alcohol, Ethyl (B) <10 <10 mg/dL  Rapid urine drug screen (hospital performed)  Result Value Ref Range   Opiates NONE DETECTED NONE DETECTED   Cocaine NONE DETECTED NONE DETECTED   Benzodiazepines NONE DETECTED NONE DETECTED   Amphetamines NONE DETECTED NONE DETECTED   Tetrahydrocannabinol NONE DETECTED NONE DETECTED   Barbiturates NONE DETECTED NONE DETECTED  Lithium level  Result  Value Ref Range   Lithium Lvl 1.37 (H) 0.60 - 1.20 mmol/L     EKG EKG Interpretation  Date/Time:  Thursday November 08 2020 07:49:51 EST Ventricular Rate:  75 PR Interval:    QRS Duration: 95 QT Interval:  369 QTC Calculation: 413 R Axis:   68 Text Interpretation: Sinus rhythm Borderline prolonged PR interval Anterior infarct, old Borderline repolarization abnormality Confirmed by Fredia Sorrow [phone]) on 11/08/2020 8:16:23 AM   Radiology DG Chest 2 View  Result Date: 11/08/2020 CLINICAL DATA:  Mental status changes. EXAM: CHEST - 2 VIEW COMPARISON:  12/23/2019 FINDINGS: The cardiac silhouette, mediastinal and hilar contours are within normal limits and stable. Stable underlying emphysematous changes and remote post pneumonic scarring changes most notably in the right lower. A patchy nodularity in the upper lobes, left greater than right could reflect atypical infiltrate such as MAC. No focal airspace consolidation or pleural effusion. Nodular density at the right lung base is likely the right nipple shadow. Chest CT may be helpful for further evaluation. IMPRESSION: 1. Patchy nodularity in the upper lobes, left greater than right could reflect atypical infiltrate such as MAC. 2. No focal airspace consolidation or pleural effusion. 3. Post pneumonic scarring changes most notably in the right lower lobe. Electronically Signed   By: Marijo Sanes M.D.   On: 11/08/2020 09:14   CT Head Wo Contrast  Result Date: 11/08/2020 CLINICAL DATA:  Mental status change, unknown  cause. Additional history provided: Patient reports hallucinations and insomnia, found to have elevated lithium levels. EXAM: CT HEAD WITHOUT CONTRAST TECHNIQUE: Contiguous axial images were obtained from the base of the skull through the vertex without intravenous contrast. COMPARISON:  Prior head CT examinations 09/18/2017 and earlier. FINDINGS: Brain: Mild cerebral and cerebellar atrophy. There is no acute intracranial  hemorrhage. No demarcated cortical infarct. No extra-axial fluid collection. No evidence of intracranial mass. No midline shift. Vascular: No hyperdense vessel.  Atherosclerotic calcifications. Skull: Normal. Negative for fracture or focal lesion. Sinuses/Orbits: Visualized orbits show no acute finding. Mild bilateral maxillary sinus mucosal thickening. IMPRESSION: No evidence of acute intracranial abnormality. Mild generalized parenchymal atrophy, stable as compared to the head CT of 09/18/2017. Mild bilateral maxillary sinus mucosal thickening. Electronically Signed   By: Kellie Simmering DO   On: 11/08/2020 11:49   CT Chest Wo Contrast  Result Date: 11/08/2020 CLINICAL DATA:  Pneumonia. EXAM: CT CHEST WITHOUT CONTRAST TECHNIQUE: Multidetector CT imaging of the chest was performed following the standard protocol without IV contrast. COMPARISON:  January 09, 2016. FINDINGS: Cardiovascular: Atherosclerosis of thoracic aorta is noted without aneurysm formation. Normal cardiac size. No pericardial effusion. Coronary artery calcifications are noted. Mediastinum/Nodes: No enlarged mediastinal or axillary lymph nodes. Thyroid gland, trachea, and esophagus demonstrate no significant findings. Lungs/Pleura: No pneumothorax or pleural effusion is noted. Emphysematous disease is noted bilaterally. Severe bronchiectasis is noted in the right lower and middle lobes most consistent with sequela of prior infection. Mild bronchiectasis is noted in the left lower lobe. There is interval development of irregular peripheral subpleural densities involving the left upper lobe with associated cystic change which also may represent acute atypical inflammation or sequela of previous such infection. New 13 x 11 mm subpleural density is noted posteriorly in the right lower lobe best seen on image number 58 of series 4, which may represent focal inflammation or atelectasis, but possible neoplasm cannot be excluded. Irregular densities are  noted posteriorly in the right lower lobe which may represent postinfectious scarring or other sequela of prior infection. Upper Abdomen: No acute abnormality. Musculoskeletal: There is interval development of large sclerotic densities involving what appears to be the T12 and L1 vertebral bodies highly concerning for metastatic disease. IMPRESSION: 1. Interval development of large sclerotic densities involving what appears to be the T12 and L1 vertebral bodies highly concerning for metastatic disease. MRI is recommended further evaluation. 2. Severe bronchiectasis is noted in the right lower and middle lobes most consistent with sequela of prior infection. 3. Mild bronchiectasis is noted in the left lower lobe. 4. Interval development of irregular peripheral subpleural densities involving the left upper lobe with associated cystic change which also may represent acute atypical inflammation or sequela of previous such infection. 5. New 13 x 11 mm subpleural density is noted posteriorly in the right lower lobe which may represent focal inflammation or atelectasis, but possible neoplasm cannot be excluded. Follow-up CT scan in 3-4 weeks is recommended to ensure resolution and rule out neoplasm. 6. Coronary artery calcifications are noted suggesting coronary artery disease. Aortic Atherosclerosis (ICD10-I70.0) and Emphysema (ICD10-J43.9). Electronically Signed   By: Marijo Conception M.D.   On: 11/08/2020 12:00    Procedures Procedures (including critical care time)  Medications Ordered in ED Medications - No data to display  ED Course  I have reviewed the triage vital signs and the nursing notes.  Pertinent labs & imaging results that were available during my care of the patient were reviewed by  me and considered in my medical decision making (see chart for details).    MDM Rules/Calculators/A&P                          Patient with extensive work-up here.  Stated Covid testing was negative.  Lithium  level has come down to 1.37.  Patient is on twice daily lithium but that is continuous release.  She will take a while for this to come down.  Patient has not had any lithium here today.  We did have her normal doses yesterday.  Chest x-ray raise some concerns for possible pneumonia so CT chest was done.  That showed no evidence of pneumonia.  Did show evidence of pulmonary nodules as well as a T12 sclerotic lesion which will need outpatient follow-up.  CT head without any acute findings.  Patient's free T4 was normal.  Free T3 is pending.  The elevated TSH may be secondary to the lithium.  Patient certainly not exhibiting any distinct signs of hypothyroidism.  Patient is actually having difficulty sleeping as per her husband.  Patient medically cleared.  For the CT chest changes of the pulmonary nodule and sclerotic lesion in T12 patient follow-up with her primary care doctor.  Lower extremities distinctly without cellulitis at this time.  Will have TTS reconsult on patient for consideration for admission there is no suicidal ideation but she appears to be having hallucinations.  Not able to sleep which may be an exacerbation of her bipolar disease.  Psych holding orders will be placed and medications will be renewed but will hold on the lithium for now.     Final Clinical Impression(s) / ED Diagnoses Final diagnoses:  Hallucinations, visual  Lung nodule  Abnormal blood level of lithium  Bipolar 1 disorder [facility])    Rx / DC Orders ED Discharge Orders    None       Fredia Sorrow, MD 11/08/20 1549

## 2020-11-08 NOTE — ED Notes (Signed)
Sandwich given 

## 2020-11-08 NOTE — ED Notes (Signed)
[facility] Charge Nurse Claiborne Billings notified that pt being transferred to ED for evaluation of elevated Lithium level 1.51.  EMS transport requested.

## 2020-11-08 NOTE — ED Notes (Signed)
LAB CALLED TO REPORT LITHIUM LEVEL 1.51.  NP [place] NOTIFIED.

## 2020-11-08 NOTE — Consult Note (Signed)
Telepsych Consultation   Location of Patient: [facility] Location of Provider: [facility]  Patient Identification: Catherine Kelley MRN:  [id number] Principal Diagnosis: <principal problem not specified> Diagnosis:  Active Problems:   * No active hospital problems. *   Total Time spent with patient: 45 minutes  HPI:  Reassessment: Patient seen via telepsych. Chart reviewed. Catherine Kelley is a 69 year old female with history of bipolar disorder, seen by Dr. Adele Schilder outpatient. She presented to [facility] last night after five nights of not sleeping and new onset of visual hallucinations of her deceased mother. She was sent to [facility] due to [facility] provider concern for patient's age, new onset VH, and lithium level of 1.51.  On assessment today, patient presents with normal rate of speech, pleasant, tangential, and mildly confused. Most recent lithium level was 1.37. Patient has undergone medical workup included CT scans with no acute findings. She is oriented to person and time. When asked about place, she asks "Am I in behavioral health now?" and reoriented that she was transferred to [place] from [facility]. Patient reports VH of her deceased mother for the past week. She recognizes them as hallucinations. Denies AH and denies history of hallucinations. Denies any AVH since she has been in the hospital. She shows no signs of responding to internal stimuli. Denies mood instability. She provides a confusing history when asked about her pattern of taking lithium. She admits to accidentally missing doses and then taking them when she remembered. It seems patient's elevated lithium levels may be related to missing doses and then overtaking the medication.   She admits to not sleeping over the last week. Per review of Dr. Marguerite Olea outpatient note 10/30/20 patient was not taking her medications as prescribed, but was sleeping better after starting hydroxyzine at HS. I asked patient if she has been taking her hydroxyzine at  night, and she admits that she stopped taking hydroxyzine this week- "I don't know why." She states that she is willing to restart this medication at night. She denies any SI/HI/AVH. She and her husband deny safety concerns for discharge and state no firearms in the home.  Patient was seen with her husband at the bedside, with the patient's permission. I reinforced with both of them the importance of taking lithium only as prescribed on schedule due to narrow therapeutic window and medical complications with abnormal levels. They state understanding. We discussed, and patient's husband states that he will monitor patient's medication compliance, and in particular hold on to patient's lithium and administer it to her as prescribed. Discussed with Dr. Dwyane Dee, and discussed with patient and husband holding lithium until tomorrow night, restarting lithium 450 mg QHS for 3 days, then resuming 450 mg BID.   Per TTS assessment: Catherine Kelley is a 69 y.o. female with a history of bipolar disorder, depression, and GAD who presents voluntarily to [facility] with her husband at the request of Dr. Adele Schilder due to visual hallucinations. On evaluation, patient is very pleasant.  She is alert and oriented x4.  Her speech is clear and coherent, fast but not pressured.  Patient reports that she has been having a visual hallucination of her mother who passed away 11 years ago.  She states that the hallucinations started a few days ago.  She states "I miss my mother.  I love her.  But, she is dead and I do not want her in my house."  She goes on to state "when my mother passed away she was in a wheelchair,  this woman is now walking around my kitchen."  She states that she realizes that her mother is deceased and that the hallucination is not real.  She denies any auditory hallucinations.  She does not appear to be responding to any internal stimuli.  She denies suicidal ideations.  She denies homicidal ideations.  She reports some periods of  depression and anxiety.  She states that the hallucinations of her mother is causing her to feel anxious.  Patient is pleasant throughout the assessment, she shares memories of her mother, and laughs appropriately at times.  Patient reports that she has been missing doses of her medications the last few days.  She states "I mean to take them when I am getting ready for bed, but when I wake up they are still sitting there, then I must not be taking them.   Disposition: Patient with VH and insomnia, likely related to medication noncompliance. Patient is calm and cooperative at this time and shows no signs of psychosis or mania. She denies any SI/HI/AVH. She shows no acute risk of harm to self or others and is psych cleared for discharge. ED staff updated.  **Hold lithium until tomorrow night 11/09/20. Then start lithium 450 mg once per day for 3 days, then resume lithium 450 mg twice a day.**  Past Psychiatric History: See above  Risk to Self:   Risk to Others:   Prior Inpatient Therapy:   Prior Outpatient Therapy:    Past Medical History:  Past Medical History:  Diagnosis Date  . Anxiety   . Arthritis    "jooints" (09/18/2017)  . Asthma   . Bipolar disorder ([place])   . Bipolar I disorder ([place]) 10/11/2019  . CAP (community acquired pneumonia) 09/18/2017  . Cat allergies   . Chronic bronchitis ([place])    "get it alot; maybe not q yr" (07/06/2014)  . Chronic lower back pain    "spurs and pinched nerves" (09/18/2017)  . COPD (chronic obstructive pulmonary disease) ([place])   . DDD (degenerative disc disease), lumbar   . Depression    psychiatrist Dr. Toy Care  . Diastolic dysfunction   . Emphysematous COPD ([place])    changes in right base along with patchy areas on last x-ray  . Fall as cause of accidental injury at home as place of occurrence 10/11/2019  . Fall involving wheelchair as cause of accidental injury 10/11/2019  . Falls frequently    "several in 2017; fell at dr's office yesterday"  (09/18/2017)  . GERD (gastroesophageal reflux disease)   . HCAP (healthcare-associated pneumonia) 02/12/2016  . Heart murmur   . History of cardiovascular stress test 08/15/2004   EF of 70% -- Normal stress cardiolite.  There is no evidence of ischemia and there is normal LV function. -- [name]. MD  . History of echocardiogram 12/24/2006   Est. EF of 16-10% NORMAL LV SYSTOLIC FUNCTION WITH IMPAIRED RELAXATION -- MILD AORTIC SCLEROSIS -- NORMAL PALONARY ARTERY PRESSURE -- NO OLD ECHOS FOR COMPARISON -- [name], MD  . Hypertension    essential hypertension  . Migraine    "when I was having my periods; none since then" (09/18/2017)  . Necrotic pneumonia ([place])    recurrent/notes 02/12/2016  . Pneumonia    "several times since May 2015" (07/06/2014)  . Pollen allergies   . Tobacco abuse    smoking about 1/2 pk of cigarettes a day  . Tobacco dependence due to cigarettes 10/11/2019  . Traumatic hematoma of knee, right, initial encounter  10/11/2019    Past Surgical History:  Procedure Laterality Date  . CAROTID STENT    . DILATION AND CURETTAGE OF UTERUS    . TUBAL LIGATION  1986   Family History:  Family History  Problem Relation Age of Onset  . Stroke Mother   . Heart disease Father   . Hyperlipidemia Father   . Hypertension Father   . Breast cancer Maternal Aunt   . Asthma Maternal Grandmother    Family Psychiatric  History: Unknown Social History:  Social History   Substance and Sexual Activity  Alcohol Use Yes  . Alcohol/week: 0.0 standard drinks   Comment: 09/18/2017 "might have 1 drink/month; if that"     Social History   Substance and Sexual Activity  Drug Use No    Social History   Socioeconomic History  . Marital status: Married    Spouse name: richard  . Number of children: 2  . Years of education: Not on file  . Highest education level: Associate degree: occupational, Hotel manager, or vocational program  Occupational History  . Occupation:  Chemical engineer: [employer]    Comment: [employer]  Tobacco Use  . Smoking status: Current Every Day Smoker    Packs/day: 0.50    Years: 46.00    Pack years: 23.00    Types: Cigarettes  . Smokeless tobacco: Never Used  Vaping Use  . Vaping Use: Never used  Substance and Sexual Activity  . Alcohol use: Yes    Alcohol/week: 0.0 standard drinks    Comment: 09/18/2017 "might have 1 drink/month; if that"  . Drug use: No  . Sexual activity: Not Currently  Other Topics Concern  . Not on file  Social History Narrative   Patient currently lives with her husband.    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 No bird or hot tub exposure. No mold exposure. No recent travel.   Social Determinants of Health   Financial Resource Strain: Not on file  Food Insecurity: Not on file  Transportation Needs: Not on file  Physical Activity: Not on file  Stress: Not on file  Social Connections: Not on file   Additional Social History:    Allergies:   Allergies  Allergen Reactions  . Sulfa Drugs Cross Reactors Hives and Rash  . Ceclor [Cefaclor] Hives    Tolerated ceftazidime December 2016  . Depakote Er [Divalproex Sodium Er] Other (See Comments)    Patient remarked that when she was taking this TWICE a day, she became "clumsy" and felt more prone to stumbling  . Escitalopram Oxalate Other (See Comments)    Pt does not recall ever taking medication (??)  . Latex Itching  . Sertraline Hcl Other (See Comments)    Severe headaches   . Tape Itching and Rash    Band-Aids, also (PAPER TAPE IS TOLERATED)    Labs:  Results for orders placed or performed during the hospital encounter of 11/08/20 (from the past 48 hour(s))  Lithium level     Status: Abnormal   Collection Time: 11/08/20  8:40 AM  Result Value Ref Range   Lithium Lvl 1.48 (H) 0.60 - 1.20 mmol/L    Comment: Performed at [facility], [address] [place], [place] [address]  T4, free     Status: None   Collection Time: 11/08/20  8:40  AM  Result Value Ref Range   Free T4 1.07 0.61 - 1.12 ng/dL    Comment: (NOTE) Biotin ingestion may interfere with free T4 tests. If the results are inconsistent with the TSH level, previous test results, or the clinical presentation, then consider biotin interference. If needed, order repeat testing after stopping biotin. Performed at [facility], [place] [address], [place], [place] [address]   Ethanol     Status: None   Collection Time: 11/08/20  8:40 AM  Result Value Ref Range   Alcohol, Ethyl (B) <10 <10 mg/dL    Comment: (NOTE) Lowest detectable limit for serum alcohol is 10 mg/dL.  For medical purposes only. Performed at [facility], [place] [address], [place], [place] [address]   Rapid urine drug screen (hospital performed)     Status: None   Collection Time: 11/08/20 11:56 AM  Result Value Ref Range   Opiates NONE DETECTED NONE DETECTED   Cocaine NONE DETECTED NONE DETECTED   Benzodiazepines NONE DETECTED NONE DETECTED   Amphetamines NONE DETECTED NONE DETECTED   Tetrahydrocannabinol NONE DETECTED NONE DETECTED   Barbiturates NONE DETECTED NONE DETECTED    Comment: (NOTE) DRUG SCREEN FOR MEDICAL PURPOSES ONLY.  IF CONFIRMATION IS NEEDED FOR ANY PURPOSE, NOTIFY LAB WITHIN 5 DAYS.  LOWEST DETECTABLE LIMITS FOR URINE DRUG SCREEN Drug Class                     Cutoff (ng/mL) Amphetamine and metabolites    1000 Barbiturate and metabolites    200 Benzodiazepine                 027 Tricyclics and metabolites     300 Opiates and metabolites        300 Cocaine and metabolites        300 THC                            50 Performed at [facility], [place] [address] [place], [place] [address]   Lithium level     Status: Abnormal   Collection Time: 11/08/20  2:02 PM  Result Value Ref Range   Lithium Lvl 1.37 (H) 0.60 - 1.20 mmol/L    Comment:  Performed at [facility] [address] [place], [place] [address]    Medications:  No current facility-administered medications for this encounter.   Current Outpatient Medications  Medication Sig Dispense Refill  . Acetaminophen (TYLENOL ARTHRITIS PAIN PO) Take by mouth.    [name] albuterol (PROVENTIL) (2.5 MG/3ML) 0.083% nebulizer solution Take 2.5 mg by nebulization every 6 (six) hours as needed for wheezing or shortness of breath.    [name] albuterol (VENTOLIN HFA) 108 (90 Base) MCG/ACT inhaler Inhale 2 puffs into the lungs every 6 (six) hours as needed for wheezing or shortness of breath. 18 g 11  . alendronate (FOSAMAX) 70 MG tablet Take 1 tablet (70 mg total) by mouth every 7 (seven) days. Take with a full glass of water on an empty stomach. 4 tablet 11  . amLODipine (NORVASC) 2.5 MG tablet TAKE 1 TABLET BY MOUTH EVERY DAY 90 tablet 1  . aspirin EC 81 MG tablet Take 81 mg by mouth daily as needed.     . fluticasone (FLONASE) 50 MCG/ACT nasal spray Place 2 sprays into both nostrils daily. 16 g 6  . hydrOXYzine (ATARAX/VISTARIL) 10 MG tablet Take 1 tablet (10  mg total) by mouth at bedtime as needed for anxiety (sleep). 90 tablet 0  . lithium carbonate (ESKALITH) 450 MG CR tablet Take 1 tablet (450 mg total) by mouth 2 (two) times daily. 180 tablet 0  . nortriptyline (PAMELOR) 50 MG capsule Take 1 capsule (50 mg total) by mouth at bedtime. 90 capsule 0  . triamcinolone cream (KENALOG) 0.1 % Apply 1 application topically 2 (two) times daily. 30 g 1  . venlafaxine (EFFEXOR) 75 MG tablet Take 1 tablet (75 mg total) by mouth daily. 90 tablet 0  . Vitamin D, Ergocalciferol, (DRISDOL) 1.25 MG (50000 UNIT) CAPS capsule Take 1 capsule (50,000 Units total) by mouth every 7 (seven) days. 12 capsule 0   Psychiatric Specialty Exam: Physical Exam  Review of Systems  Blood pressure (!) 104/59, pulse 85, temperature 98.1 F (36.7 C), temperature source Oral, resp. rate 17, height 5\' 7"  (1.702 m),  weight 52 kg, SpO2 92 %.Body mass index is 17.96 kg/m.  General Appearance: Fairly Groomed  Eye Contact:  Good  Speech:  Normal Rate  Volume:  Normal  Mood:  Euthymic  Affect:  Congruent  Thought Process:  Descriptions of Associations: Tangential  Orientation:  Full (Time, Place, and Person)  Thought Content:  Tangential  Suicidal Thoughts:  No  Homicidal Thoughts:  No  Memory:  Immediate;   Fair Recent;   Fair Remote;   Fair  Judgement:  Intact  Insight:  Fair  Psychomotor Activity:  Normal  Concentration:  Concentration: Fair and Attention Span: Fair  Recall:  [employer] of Knowledge:  Fair  Language:  Fair  Akathisia:  No  Handed:  Right  AIMS (if indicated):     Assets:  Communication Skills Desire for Improvement Financial Resources/Insurance Housing Leisure Time Social Support  ADL's:  Intact  Cognition:  WNL  Sleep:       Disposition: Patient with VH and insomnia, likely related to medication noncompliance. Patient is calm and cooperative at this time and shows no signs of psychosis or mania. She denies any SI/HI/AVH. She shows no acute risk of harm to self or others and is psych cleared for discharge. ED staff updated.  **Hold lithium until tomorrow night 11/09/20. Then start lithium 450 mg once per day for 3 days, then resume lithium 450 mg twice a day.**  This service was provided via telemedicine using a 2-way, interactive audio and video technology with the identified patient, husband Catherine Kelley, and this Probation officer.   Connye Burkitt, NP 11/08/2020 3:36 PM

## 2020-11-08 NOTE — Discharge Instructions (Addendum)
Please hold your Lithium dose until tomorrow night then start back with 450mg  at night for 3 days and then 450mg  twice a day after that. Follow up with your psychiatrist for further management.   Follow-up with your primary care doctor for the lung nodule.  Also there were some sclerotic changes in your T12 vertebrae.  That will need follow-up by your primary care doctor.

## 2020-11-08 NOTE — BH Assessment (Signed)
Comprehensive Clinical Assessment (CCA) Screening, Triage and Referral Note  11/08/2020 Catherine Kelley [id number] Patient was recommended to come to [facility] by Dr. Berniece Kelley [facility] outpatient).  He had wanted her evaluated and have a lithium level done.  Patient says she has not slept in the last 4-5 nights.  She has been seeing her deceased mother in her house.  Patient says that her mother passed away 11 years ago.  Patient says that a few days ago she started seeing her mother in the house.  She says "I know she is there but at the same time she can't be because she is dead."  She says "I don't want to be seeing her like that."    Patient says "My medication is laying out and it is still there when I wake up in the morning so I must not be taking it."  Patient could not say how often she was missing her medication.  Patient says she knows she has been missing her lithium.  Patient denies any SI or HI.  She says she does not hear things but is seeing her mother.  She says she has heard her mother in the past.  Denies use of ETOH or other substances.  Patient presents as being manic, talking rapidly.  She does laugh appropriately and is pleasant to speak with.  Patient eye contact his good.  She is responding to internal stimuli.  She does not evidence delusional thought process.    -Patient was seen by [facility] for her MSE.  He recommended patient stay overnight and be seen by psychiatry in the morning.  Pt at first hesitated then agreed to stay.      Chief Complaint:  Chief Complaint  Patient presents with  . Hallucinations   Visit Diagnosis: Bipolar 1 d/o most recent episode manic  Patient Reported Information How did you hear about [name]? Other (Comment) (Dr. Adele Schilder)   Referral name: Dr. Adele Schilder   Referral phone number: No data recorded Whom do you see for routine medical problems? Other (Comment) ([place])   Practice/Facility Name: No data recorded  Practice/Facility  Phone Number: No data recorded  Name of Contact: No data recorded  Contact Number: No data recorded  Contact Fax Number: No data recorded  Prescriber Name: No data recorded  Prescriber Address (if known): No data recorded What Is the Reason for Your Visit/Call Today? Dr. Adele Schilder sent her to [facility].  Pt has not been taking her medication right.  She is on lithium and has not been taking correctly.  Patient has been seeing things.  She thought she saw her deceased mother at her home.  She acknowledges that this may be a hallucination.  How Long Has This Been Causing You Problems? 1 wk - 1 month  Have You Recently Been in Any Inpatient Treatment (Hospital/Detox/Crisis Center/28-Day Program)? No   Name/Location of Program/Hospital:No data recorded  How Long Were You There? No data recorded  When Were You Discharged? No data recorded Have You Ever Received Services From [facility] Before? Yes   Who Do You See at [facility]? Dr. Adele Schilder at [facility] outpatient  Have You Recently Had Any Thoughts About [place]? No   Are You Planning to Commit Suicide/Harm Yourself At This time?  No  Have you Recently Had Thoughts About [place]? No   Explanation: No data recorded Have You Used Any Alcohol or Drugs in the Past 24 Hours? No  How Long Ago Did You Use Drugs or Alcohol?  No data recorded  What Did You Use and How Much? No data recorded What Do You Feel Would Help You the Most Today? Medication; Assessment Only  Do You Currently Have a Therapist/Psychiatrist? Yes   Name of Therapist/Psychiatrist: Dr. Adele Schilder at [facility] outpatient   Have You Been Recently Discharged From Any Office Practice or Programs? No   Explanation of Discharge From Practice/Program:  No data recorded    CCA Screening Triage Referral Assessment Type of Contact: Face-to-Face   Is this Initial or Reassessment? No data recorded  Date Telepsych consult ordered in [facility]:  No data recorded  Time Telepsych  consult ordered in [facility]:  No data recorded Patient Reported Information Reviewed? Yes   Patient Left Without Being Seen? No data recorded  Reason for Not Completing Assessment: No data recorded Collateral Involvement: No data recorded Does Patient Have a [place]? No data recorded  Name and Contact of Legal Guardian:  No data recorded If Minor and Not Living with Parent(s), Who has Custody? No data recorded Is CPS involved or ever been involved? Never  Is APS involved or ever been involved? Never  Patient Determined To Be At Risk for Harm To Self or Others Based on Review of Patient Reported Information or Presenting Complaint? No   Method: No data recorded  Availability of Means: No data recorded  Intent: No data recorded  Notification Required: No data recorded  Additional Information for Danger to Others Potential:  No data recorded  Additional Comments for Danger to Others Potential:  No data recorded  Are There Guns or Other Weapons in Your Home?  No data recorded   Types of Guns/Weapons: No data recorded   Are These Weapons Safely Secured?                              No data recorded   Who Could Verify You Are Able To Have These Secured:    No data recorded Do You Have any Outstanding Charges, Pending Court Dates, Parole/Probation? No data recorded Contacted To Inform of Risk of Harm To Self or Others: No data recorded Location of Assessment: [facility] Assessment Services  Does Patient Present under Involuntary Commitment? No   IVC Papers Initial File Date: No data recorded  [place] of Residence: [place]  Patient Currently Receiving the Following Services: Medication Management   Determination of Need: Emergent (2 hours)   Options For Referral: Medication Management   Raymondo Band, LCAS

## 2020-11-09 ENCOUNTER — Other Ambulatory Visit (HOSPITAL_COMMUNITY): Payer: Self-pay | Admitting: Psychiatry

## 2020-11-09 ENCOUNTER — Other Ambulatory Visit: Payer: Self-pay | Admitting: Registered Nurse

## 2020-11-09 DIAGNOSIS — J449 Chronic obstructive pulmonary disease, unspecified: Secondary | ICD-10-CM

## 2020-11-09 DIAGNOSIS — F411 Generalized anxiety disorder: Secondary | ICD-10-CM

## 2020-11-09 DIAGNOSIS — F319 Bipolar disorder, unspecified: Secondary | ICD-10-CM

## 2020-11-09 LAB — T3, FREE: T3, Free: 2.9 pg/mL (ref 2.0–4.4)

## 2020-11-09 NOTE — Telephone Encounter (Signed)
Requested medication (s) are due for refill today: yes  Requested medication (s) are on the active medication list: yes  Last refill:  07/15/19  16g  6 refills  Future visit scheduled: yes  Notes to clinic:  expired RX    Requested Prescriptions  Pending Prescriptions Disp Refills   fluticasone (FLONASE) 50 MCG/ACT nasal spray [Pharmacy Med Name: FLUTICASONE PROP 50 MCG SPRAY] 48 mL 2    Sig: SPRAY 2 SPRAYS INTO EACH NOSTRIL EVERY DAY      Ear, Nose, and Throat: Nasal Preparations - Corticosteroids Passed - 11/09/2020 10:10 AM      Passed - Valid encounter within last 12 months    Recent Outpatient Visits           1 month ago Cystitis   Primary Care at [facility], Fenton Malling, MD   2 months ago Cellulitis of both lower extremities   Primary Care at [facility], [place], NP   3 months ago COPD with acute exacerbation [facility])   Primary Care at [facility], Lilia Argue, MD   4 months ago Urinary frequency   Primary Care at [facility], Lilia Argue, MD   7 months ago Urinary frequency   Primary [facility] at [facility], Delfino Lovett, NP       Future Appointments             In 1 month Maximiano Coss, NP Primary Care at [facility], [facility]

## 2020-11-16 DIAGNOSIS — I10 Essential (primary) hypertension: Secondary | ICD-10-CM | POA: Diagnosis not present

## 2020-12-06 ENCOUNTER — Telehealth (HOSPITAL_COMMUNITY): Payer: Self-pay | Admitting: Family Medicine

## 2020-12-06 NOTE — Telephone Encounter (Signed)
Care Management - Follow Up [facility] Discharges   Writer attempted to make contact with patient today and was unsuccessful.  Writer was able to leave a HIPPA compliant voice message and will await callback.  Per chart review, patient has a follow up appointment with Dr. Adele Schilder on 01-21-2021

## 2020-12-24 ENCOUNTER — Other Ambulatory Visit: Payer: Self-pay | Admitting: Registered Nurse

## 2020-12-28 ENCOUNTER — Encounter: Payer: Medicare Other | Admitting: Registered Nurse

## 2021-01-15 ENCOUNTER — Other Ambulatory Visit: Payer: Self-pay | Admitting: Registered Nurse

## 2021-01-15 NOTE — Telephone Encounter (Signed)
Requested medications are due for refill today.  Unknown  Requested medications are on the active medications list.  yes  Last refill. unknown  Future visit scheduled.   no  Notes to clinic. Historical medication - no recent rx.

## 2021-01-21 ENCOUNTER — Other Ambulatory Visit: Payer: Self-pay

## 2021-01-21 ENCOUNTER — Encounter (HOSPITAL_COMMUNITY): Payer: Self-pay | Admitting: Psychiatry

## 2021-01-21 ENCOUNTER — Telehealth (INDEPENDENT_AMBULATORY_CARE_PROVIDER_SITE_OTHER): Payer: Medicare PPO | Admitting: Psychiatry

## 2021-01-21 VITALS — Wt 115.0 lb

## 2021-01-21 DIAGNOSIS — F419 Anxiety disorder, unspecified: Secondary | ICD-10-CM

## 2021-01-21 DIAGNOSIS — F319 Bipolar disorder, unspecified: Secondary | ICD-10-CM

## 2021-01-21 MED ORDER — NORTRIPTYLINE HCL 50 MG PO CAPS: 50.0000 mg | ORAL_CAPSULE | Freq: Every day | ORAL | 0 refills | Status: DC

## 2021-01-21 MED ORDER — VENLAFAXINE HCL 75 MG PO TABS
75.0000 mg | ORAL_TABLET | Freq: Every day | ORAL | 0 refills | Status: DC
Start: 2021-01-21 — End: 2021-04-15

## 2021-01-21 MED ORDER — HYDROXYZINE PAMOATE 25 MG PO CAPS
25.0000 mg | ORAL_CAPSULE | Freq: Three times a day (TID) | ORAL | 2 refills | Status: DC | PRN
Start: 2021-01-21 — End: 2021-06-17

## 2021-01-21 MED ORDER — LITHIUM CARBONATE ER 450 MG PO TBCR: 450.0000 mg | EXTENDED_RELEASE_TABLET | Freq: Two times a day (BID) | ORAL | 0 refills | Status: DC

## 2021-01-21 NOTE — Progress Notes (Signed)
Virtual Visit via Telephone Note  I connected with Catherine Kelley on 01/21/21 at  2:00 PM EST by telephone and verified that I am speaking with the correct person using two identifiers.  Location: Patient: Home Provider: Home Office   I discussed the limitations, risks, security and privacy concerns of performing an evaluation and management service by telephone and the availability of in person appointments. I also discussed with the patient that there may be a patient responsible charge related to this service. The patient expressed understanding and agreed to proceed.   History of Present Illness: Patient is evaluated by phone session.  She was seen in the emergency room in early December after she was found confused, having hallucination of her mother who deceased many years ago.  Her labs were drawn and her lithium level was very high.  Patient told that she was not taking the lithium regularly and realized that after missing many days she get more anxious and took too many lithium.  She stayed in the ER and discharge to restart the lithium and now she is taking 450 mg twice a day.  Some nights she has trouble sleeping but overall she describes her mood is okay.  She has mild cognitive impairment as sometimes he does not remember well.  She denies any hallucination, paranoia or any suicidal thoughts.  Her appetite is okay.  Her energy level is fair.  Sometimes she gets very anxious and nervous but denies any panic attack.  She lives with her husband who is supportive.  Patient told her other family members are visiting [place] for vacation.  Patient has no tremors, shakes or any EPS.  Her appetite is okay.  Her weight is stable.  She denies any suicidal thoughts.    Past Psychiatric History: H/Odepression,anxiety and bipolar d/o.Had tried Paxil, Prozac and Zoloft that cause headaches.Abilify caused stiffnes.Took Xanax for many years however it was not renewed by primary care physician when  Dr.Kauroffice did not accept her insurance. Did not recall any history of suicidal attempt or psychiatric inpatient treatment.   Recent Results (from the past 2160 hour(s))  Resp Panel by RT-PCR (Flu A&B, Covid) Nasopharyngeal Swab     Status: None   Collection Time: 11/08/20  2:17 AM   Specimen: Nasopharyngeal Swab; Nasopharyngeal(NP) swabs in vial transport medium  Result Value Ref Range   SARS Coronavirus 2 by RT PCR NEGATIVE NEGATIVE    Comment: (NOTE) SARS-CoV-2 target nucleic acids are NOT DETECTED.  The SARS-CoV-2 RNA is generally detectable in upper respiratory specimens during the acute phase of infection. The lowest concentration of SARS-CoV-2 viral copies this assay can detect is 138 copies/mL. A negative result does not preclude SARS-Cov-2 infection and should not be used as the sole basis for treatment or other patient management decisions. A negative result may occur with  improper specimen collection/handling, submission of specimen other than nasopharyngeal swab, presence of viral mutation(s) within the areas targeted by this assay, and inadequate number of viral copies(<138 copies/mL). A negative result must be combined with clinical observations, patient history, and epidemiological information. The expected result is Negative.  Fact Sheet for Patients:  EntrepreneurPulse.com.au  Fact Sheet for Healthcare Providers:  IncredibleEmployment.be  This test is no t yet approved or cleared by the [place] FDA and  has been authorized for detection and/or diagnosis of SARS-CoV-2 by FDA under an Emergency Use Authorization (EUA). This EUA will remain  in effect (meaning this test can be used) for the duration of  the COVID-19 declaration under Section 564(b)(1) of the Act, 21 U.S.C.section 360bbb-3(b)(1), unless the authorization is terminated  or revoked sooner.       Influenza A by PCR NEGATIVE NEGATIVE   Influenza B by  PCR NEGATIVE NEGATIVE    Comment: (NOTE) The Xpert Xpress SARS-CoV-2/FLU/RSV plus assay is intended as an aid in the diagnosis of influenza from Nasopharyngeal swab specimens and should not be used as a sole basis for treatment. Nasal washings and aspirates are unacceptable for Xpert Xpress SARS-CoV-2/FLU/RSV testing.  Fact Sheet for Patients: EntrepreneurPulse.com.au  Fact Sheet for Healthcare Providers: IncredibleEmployment.be  This test is not yet approved or cleared by the [place] FDA and has been authorized for detection and/or diagnosis of SARS-CoV-2 by FDA under an Emergency Use Authorization (EUA). This EUA will remain in effect (meaning this test can be used) for the duration of the COVID-19 declaration under Section 564(b)(1) of the Act, 21 U.S.C. section 360bbb-3(b)(1), unless the authorization is terminated or revoked.  Performed at [facility], [place] [address], [place], [place] [address]   POC SARS Coronavirus 2 Ag-ED - Nasal Swab (BD Veritor Kit)     Status: None (Preliminary result)   Collection Time: 11/08/20  2:17 AM  Result Value Ref Range   SARS Coronavirus 2 Ag Negative Negative  CBC with Differential/Platelet     Status: Abnormal   Collection Time: 11/08/20  2:18 AM  Result Value Ref Range   WBC 10.4 4.0 - 10.5 K/uL   RBC 4.89 3.87 - 5.11 MIL/uL   Hemoglobin 15.0 12.0 - 15.0 g/dL   HCT 44.5 36.0 - 46.0 %   MCV 91.0 80.0 - 100.0 fL   MCH 30.7 26.0 - 34.0 pg   MCHC 33.7 30.0 - 36.0 g/dL   RDW 12.9 11.5 - 15.5 %   Platelets 170 150 - 400 K/uL   nRBC 0.0 0.0 - 0.2 %   Neutrophils Relative % 72 %   Neutro Abs 7.5 1.7 - 7.7 K/uL   Lymphocytes Relative 14 %   Lymphs Abs 1.5 0.7 - 4.0 K/uL   Monocytes Relative 6 %   Monocytes Absolute 0.6 0.1 - 1.0 K/uL   Eosinophils Relative 7 %   Eosinophils Absolute 0.7 (H) 0.0 - 0.5 K/uL   Basophils Relative 1 %   Basophils Absolute 0.1 0.0 - 0.1 K/uL   Immature  Granulocytes 0 %   Abs Immature Granulocytes 0.03 0.00 - 0.07 K/uL    Comment: Performed at [facility] [address], [place], [place] [address]  Comprehensive metabolic panel     Status: Abnormal   Collection Time: 11/08/20  2:18 AM  Result Value Ref Range   Sodium 135 135 - 145 mmol/L   Potassium 3.5 3.5 - 5.1 mmol/L   Chloride 100 98 - 111 mmol/L   CO2 25 22 - 32 mmol/L   Glucose, Bld 88 70 - 99 mg/dL    Comment: Glucose reference range applies only to samples taken after fasting for at least 8 hours.   BUN 13 8 - 23 mg/dL   Creatinine, Ser 0.65 0.44 - 1.00 mg/dL   Calcium 9.9 8.9 - 10.3 mg/dL   Total Protein 7.2 6.5 - 8.1 g/dL   Albumin 3.8 3.5 - 5.0 g/dL   AST 24 15 - 41 U/L   ALT 20 0 - 44 U/L   Alkaline Phosphatase 154 (H) 38 - 126 U/L   Total Bilirubin 0.7 0.3 - 1.2 mg/dL  GFR, Estimated >60 >60 mL/min    Comment: (NOTE) Calculated using the CKD-EPI Creatinine Equation (2021)    Anion gap 10 5 - 15    Comment: Performed at [facility], [place] [address], [place], [place] [address]  Hemoglobin A1c     Status: None   Collection Time: 11/08/20  2:18 AM  Result Value Ref Range   Hgb A1c MFr Bld 4.8 4.8 - 5.6 %    Comment: (NOTE) Pre diabetes:          5.7%-6.4%  Diabetes:              >6.4%  Glycemic control for   <7.0% adults with diabetes    Mean Plasma Glucose 91.06 mg/dL    Comment: Performed at [facility] [address], [place], [place] [address]  TSH     Status: Abnormal   Collection Time: 11/08/20  2:18 AM  Result Value Ref Range   TSH 7.598 (H) 0.350 - 4.500 uIU/mL    Comment: Performed by a 3rd Generation assay with a functional sensitivity of <=0.01 uIU/mL. Performed at [facility], [place] [address] [place], [place] [address]   Lithium level     Status: Abnormal   Collection Time: 11/08/20  2:18 AM  Result Value Ref Range   Lithium Lvl 1.51 (HH) 0.60 - 1.20 mmol/L    Comment: CRITICAL RESULT CALLED TO, READ BACK BY AND  VERIFIED WITH: [place] [id number]on Performed at [facility], [address], [place], [place] [address]   Lipid panel     Status: None   Collection Time: 11/08/20  2:18 AM  Result Value Ref Range   Cholesterol 160 0 - 200 mg/dL   Triglycerides 73 <150 mg/dL   HDL 73 >40 mg/dL   Total CHOL/HDL Ratio 2.2 RATIO   VLDL 15 0 - 40 mg/dL   LDL Cholesterol 72 0 - 99 mg/dL    Comment:        Total Cholesterol/HDL:CHD Risk Coronary Heart Disease Risk Table                     Men   Women  1/2 Average Risk   3.4   3.3  Average Risk       5.0   4.4  2 X Average Risk   9.6   7.1  3 X Average Risk  23.4   11.0        Use the calculated Patient Ratio above and the CHD Risk Table to determine the patient's CHD Risk.        ATP III CLASSIFICATION (LDL):  <100     mg/dL   Optimal  100-129  mg/dL   Near or Above                    Optimal  130-159  mg/dL   Borderline  160-189  mg/dL   High  >190     mg/dL   Very High Performed at [facility] [address], [place], [place] [address]   POC SARS Coronavirus 2 Ag     Status: None   Collection Time: 11/08/20  2:25 AM  Result Value Ref Range   SARS Coronavirus 2 Ag NEGATIVE NEGATIVE    Comment: (NOTE) SARS-CoV-2 antigen NOT DETECTED.   Negative results are presumptive.  Negative results do not preclude SARS-CoV-2 infection and should not be used  as the sole basis for treatment or other patient management decisions, including infection  control decisions, particularly in the presence of clinical signs and  symptoms consistent with COVID-19, or in those who have been in contact with the virus.  Negative results must be combined with clinical observations, patient history, and epidemiological information. The expected result is Negative.  Fact Sheet for Patients: PodPark.tn  Fact Sheet for Healthcare Providers: GiftContent.is   This test is  not yet approved or cleared by the [place] FDA and  has been authorized for detection and/or diagnosis of SARS-CoV-2 by FDA under an Emergency Use Authorization (EUA).  This EUA will remain in effect (meaning this test can be used) for the duration of  the C OVID-19 declaration under Section 564(b)(1) of the Act, 21 U.S.C. section 360bbb-3(b)(1), unless the authorization is terminated or revoked sooner.    Lithium level     Status: Abnormal   Collection Time: 11/08/20  8:40 AM  Result Value Ref Range   Lithium Lvl 1.48 (H) 0.60 - 1.20 mmol/L    Comment: Performed at [facility] [address], [place], [place] [address]  T4, free     Status: None   Collection Time: 11/08/20  8:40 AM  Result Value Ref Range   Free T4 1.07 0.61 - 1.12 ng/dL    Comment: (NOTE) Biotin ingestion may interfere with free T4 tests. If the results are inconsistent with the TSH level, previous test results, or the clinical presentation, then consider biotin interference. If needed, order repeat testing after stopping biotin. Performed at [facility], [place] [address] [place], [place] [address]   T3, free     Status: None   Collection Time: 11/08/20  8:40 AM  Result Value Ref Range   T3, Free 2.9 2.0 - 4.4 pg/mL    Comment: (NOTE) Performed At: [facility] [place], [place] [id number] Rush Farmer MD AS:[id number]   Ethanol     Status: None   Collection Time: 11/08/20  8:40 AM  Result Value Ref Range   Alcohol, Ethyl (B) <10 <10 mg/dL    Comment: (NOTE) Lowest detectable limit for serum alcohol is 10 mg/dL.  For medical purposes only. Performed at [facility], [place] [address] [place], [place] [address]   Rapid urine drug screen (hospital performed)     Status: None   Collection Time: 11/08/20 11:56 AM  Result Value Ref Range   Opiates NONE DETECTED NONE DETECTED   Cocaine NONE DETECTED NONE DETECTED   Benzodiazepines NONE DETECTED NONE  DETECTED   Amphetamines NONE DETECTED NONE DETECTED   Tetrahydrocannabinol NONE DETECTED NONE DETECTED   Barbiturates NONE DETECTED NONE DETECTED    Comment: (NOTE) DRUG SCREEN FOR MEDICAL PURPOSES ONLY.  IF CONFIRMATION IS NEEDED FOR ANY PURPOSE, NOTIFY LAB WITHIN 5 DAYS.  LOWEST DETECTABLE LIMITS FOR URINE DRUG SCREEN Drug Class                     Cutoff (ng/mL) Amphetamine and metabolites    1000 Barbiturate and metabolites    200 Benzodiazepine                 194 Tricyclics and metabolites     300 Opiates and metabolites        300 Cocaine and metabolites        300 THC  50 Performed at [facility], [place] [address], [place], [place] [address]   Lithium level     Status: Abnormal   Collection Time: 11/08/20  2:02 PM  Result Value Ref Range   Lithium Lvl 1.37 (H) 0.60 - 1.20 mmol/L    Comment: Performed at [facility] [address], [place], [place] [address]   Psychiatric Specialty Exam: Physical Exam  Review of Systems  Weight 115 lb (52.2 kg).There is no height or weight on file to calculate BMI.  General Appearance: NA  Eye Contact:  NA  Speech:  fast but clear  Volume:  Normal  Mood:  Anxious  Affect:  NA  Thought Process:  Descriptions of Associations: Intact  Orientation:  Full (Time, Place, and Person)  Thought Content:  Logical  Suicidal Thoughts:  No  Homicidal Thoughts:  No  Memory:  Immediate;   Good  Judgement:  Fair  Insight:  Present  Psychomotor Activity:  NA  Concentration:  Concentration: Fair and Attention Span: Fair  Recall:  [employer] of Knowledge:  Fair  Language:  Good  Akathisia:  No  Handed:  Right  AIMS (if indicated):     Assets:  Communication Skills Desire for Improvement Housing Social Support Transportation  ADL's:  Intact  Cognition:  WNL  Sleep:   fair     Assessment and Plan: Bipolar disorder type I.  Anxiety.  I reviewed blood work results and emergency room visits.   She is back on lithium 450 mg twice a day and taking it regularly.  So far she has no tremors or any concern from the lithium.  She is taking nortriptyline 50 mg at bedtime, venlafaxine 75 mg daily.  She is taking hydroxyzine some nights 20 mg but is still struggle sometimes with sleep.  I recommend to try hydroxyzine 25 mg capsule at bedtime to help insomnia and anxiety.  She agreed with the plan.  We will do lithium level again on her next appointment.  Discussed medication side effects and benefits.  Recommended to call us back if she is any question or any concern.  Follow-up in 3 months.  Follow Up Instructions:    I discussed the assessment and treatment plan with the patient. The patient was provided an opportunity to ask questions and all were answered. The patient agreed with the plan and demonstrated an understanding of the instructions.   The patient was advised to call back or seek an in-person evaluation if the symptoms worsen or if the condition fails to improve as anticipated.  I provided 18 minutes of non-face-to-face time during this encounter.   Kathlee Nations, MD

## 2021-01-31 ENCOUNTER — Other Ambulatory Visit (HOSPITAL_COMMUNITY): Payer: Self-pay | Admitting: Psychiatry

## 2021-01-31 DIAGNOSIS — F411 Generalized anxiety disorder: Secondary | ICD-10-CM

## 2021-02-05 ENCOUNTER — Ambulatory Visit: Payer: Self-pay

## 2021-02-05 NOTE — Telephone Encounter (Signed)
  Pt. Reports increased shortness of breath with exertion x 1 week. Has wheezing and a productive cough.Using Ventolin inhaler "a little more than I should." "I can't come in until Friday when my husband can bring me." Warm transfer to [facility] in the practice. Answer Assessment - Initial Assessment Questions 1. RESPIRATORY STATUS: "Describe your breathing?" (e.g., wheezing, shortness of breath, unable to speak, severe coughing)      Shortness of breath 2. ONSET: "When did this breathing problem begin?"      1 week ago 3. PATTERN "Does the difficult breathing come and go, or has it been constant since it started?"      Comes and goes 4. SEVERITY: "How bad is your breathing?" (e.g., mild, moderate, severe)    - MILD: No SOB at rest, mild SOB with walking, speaks normally in sentences, can lay down, no retractions, pulse < 100.    - MODERATE: SOB at rest, SOB with minimal exertion and prefers to sit, cannot lie down flat, speaks in phrases, mild retractions, audible wheezing, pulse 100-120.    - SEVERE: Very SOB at rest, speaks in single words, struggling to breathe, sitting hunched forward, retractions, pulse > 120      Mild -moderate 5. RECURRENT SYMPTOM: "Have you had difficulty breathing before?" If Yes, ask: "When was the last time?" and "What happened that time?"      Yes 6. CARDIAC HISTORY: "Do you have any history of heart disease?" (e.g., heart attack, angina, bypass surgery, angioplasty)      Yes 7. LUNG HISTORY: "Do you have any history of lung disease?"  (e.g., pulmonary embolus, asthma, emphysema)     Asthma 8. CAUSE: "What do you think is causing the breathing problem?"      Unsure 9. OTHER SYMPTOMS: "Do you have any other symptoms? (e.g., dizziness, runny nose, cough, chest pain, fever)     Wheezing, cough  productive 10. PREGNANCY: "Is there any chance you are pregnant?" "When was your last menstrual period?"       No 11. TRAVEL: "Have you traveled out of the country in the last  month?" (e.g., travel history, exposures)       No  Protocols used: BREATHING DIFFICULTY-A-AH

## 2021-02-06 ENCOUNTER — Other Ambulatory Visit: Payer: Self-pay | Admitting: Registered Nurse

## 2021-02-06 NOTE — Telephone Encounter (Signed)
Requested medications are due for refill today.  yes  Requested medications are on the active medications list.  yes  Last refill. 07/31/2020  Future visit scheduled.  Yes  Notes to clinic.  Medication not delegated.

## 2021-02-08 ENCOUNTER — Encounter: Payer: Self-pay | Admitting: Registered Nurse

## 2021-02-08 ENCOUNTER — Telehealth (INDEPENDENT_AMBULATORY_CARE_PROVIDER_SITE_OTHER): Payer: Medicare PPO | Admitting: Registered Nurse

## 2021-02-08 ENCOUNTER — Other Ambulatory Visit: Payer: Self-pay

## 2021-02-08 DIAGNOSIS — J22 Unspecified acute lower respiratory infection: Secondary | ICD-10-CM | POA: Diagnosis not present

## 2021-02-08 DIAGNOSIS — R911 Solitary pulmonary nodule: Secondary | ICD-10-CM

## 2021-02-08 DIAGNOSIS — J441 Chronic obstructive pulmonary disease with (acute) exacerbation: Secondary | ICD-10-CM

## 2021-02-08 MED ORDER — FLOVENT HFA 110 MCG/ACT IN AERO: 1.0000 | INHALATION_SPRAY | Freq: Two times a day (BID) | RESPIRATORY_TRACT | 12 refills | Status: DC

## 2021-02-08 MED ORDER — LEVOFLOXACIN 250 MG PO TABS: 250.0000 mg | ORAL_TABLET | Freq: Every day | ORAL | 0 refills | Status: DC

## 2021-02-08 NOTE — Progress Notes (Signed)
Telemedicine Encounter- SOAP NOTE Established Patient  This telephone encounter was conducted with the patient's (or proxy's) verbal consent via audio telecommunications: yes  Patient was instructed to have this encounter in a suitably private space; and to only have persons present to whom they give permission to participate. In addition, patient identity was confirmed by use of name plus two identifiers (DOB and address).  I discussed the limitations, risks, security and privacy concerns of performing an evaluation and management service by telephone and the availability of in person appointments. I also discussed with the patient that there may be a patient responsible charge related to this service. The patient expressed understanding and agreed to proceed.  I spent a total of 30 minutes talking with the patient or their proxy.  Patient at home Provider in office  Chief Complaint  Patient presents with  . Cough    Patient states she has been having problems with her asthma with coughing and wheezing. Patient states she has been having some mucus coming up. She said she has been having a headaches, coughing, wheezing and those are symptoms of covid so she thinks she needs to be tested.    Subjective   Catherine Kelley is a 70 y.o. established patient. Telephone visit today for cough  HPI Has been coughing and wheezing. Unsure if COVID vs Allergies vs. COPD vs. Asthma exacerbation.  Some production from cough of discolored mucus.  Mild lightheadedness with coughing episodes but no LOC Has been using albuterol consistently with mild relief Coughing has led to headaches Has had multiple covid tests, negative, got booster around 1 mo ago with no AEs  Of note, seen in ED in Dec - showing large nodule with suggested follow up of 3-4 weeks, has not yet had repeat imaging No red flags as she denies hemoptysis, weight changes, night sweats, pain awaking her from sleep  Patient Active  Problem List   Diagnosis Date Noted  . Vitamin D deficiency 07/06/2020  . Cellulitis of right lower extremity 10/22/2019  . Cellulitis 10/21/2019  . Bipolar 1 disorder ([place]) 10/11/2019  . Tobacco dependence due to cigarettes 10/11/2019  . Fall as cause of accidental injury at home as place of occurrence 10/11/2019  . Traumatic hematoma of knee, right, initial encounter 10/11/2019  . Lower respiratory infection 12/22/2018  . Senile ecchymosis 09/11/2018  . CAP (community acquired pneumonia) 09/17/2017  . Near syncope 09/17/2017  . Tobacco abuse 02/12/2016  . Other fatigue 02/04/2016  . Respiratory failure with hypoxia ([place]) 01/09/2016  . Malnutrition of moderate degree 11/03/2015  . Pulmonary emphysema ([place])   . Essential hypertension 09/27/2015  . Rhabdomyolysis 08/16/2015  . Cough 06/07/2015  . Bronchiectasis with acute exacerbation ([place]) 03/07/2015  . Cigarette smoker 12/13/2014  . Stage 2 moderate COPD by GOLD classification ([place]) 10/11/2014  . Lumbar back pain with radiculopathy affecting right lower extremity 09/28/2014  . Hyponatremia 07/09/2014  . Hx of recurrent pneumonia 07/08/2014  . Venous insufficiency (chronic) (peripheral) 09/29/2012  . Lower extremity edema 03/09/2012  . Benign hypertensive heart disease without heart failure 05/09/2011  . Chest pain 05/09/2011  . Dyslipidemia 05/09/2011  . Mood disorder ([place]) 05/09/2011  . Osteoarthritis 05/09/2011    Past Medical History:  Diagnosis Date  . Anxiety   . Arthritis    "jooints" (09/18/2017)  . Asthma   . Bipolar disorder ([place])   . Bipolar I disorder ([place]) 10/11/2019  . CAP (community acquired pneumonia) 09/18/2017  . Cat allergies   .  Chronic bronchitis ([place])    "get it alot; maybe not q yr" (07/06/2014)  . Chronic lower back pain    "spurs and pinched nerves" (09/18/2017)  . COPD (chronic obstructive pulmonary disease) ([place])   . DDD (degenerative disc disease), lumbar   . Depression    psychiatrist  Dr. Toy Care  . Diastolic dysfunction   . Emphysematous COPD ([place])    changes in right base along with patchy areas on last x-ray  . Fall as cause of accidental injury at home as place of occurrence 10/11/2019  . Fall involving wheelchair as cause of accidental injury 10/11/2019  . Falls frequently    "several in 2017; fell at dr's office yesterday" (09/18/2017)  . GERD (gastroesophageal reflux disease)   . HCAP (healthcare-associated pneumonia) 02/12/2016  . Heart murmur   . History of cardiovascular stress test 08/15/2004   EF of 70% -- Normal stress cardiolite.  There is no evidence of ischemia and there is normal LV function. -- [name]. MD  . History of echocardiogram 12/24/2006   Est. EF of 93-26% NORMAL LV SYSTOLIC FUNCTION WITH IMPAIRED RELAXATION -- MILD AORTIC SCLEROSIS -- NORMAL PALONARY ARTERY PRESSURE -- NO OLD ECHOS FOR COMPARISON -- [name], MD  . Hypertension    essential hypertension  . Migraine    "when I was having my periods; none since then" (09/18/2017)  . Necrotic pneumonia ([place])    recurrent/notes 02/12/2016  . Pneumonia    "several times since May 2015" (07/06/2014)  . Pollen allergies   . Tobacco abuse    smoking about 1/2 pk of cigarettes a day  . Tobacco dependence due to cigarettes 10/11/2019  . Traumatic hematoma of knee, right, initial encounter 10/11/2019    Current Outpatient Medications  Medication Sig Dispense Refill  . Acetaminophen (TYLENOL ARTHRITIS PAIN PO) Take by mouth.    [name] albuterol (PROVENTIL) (2.5 MG/3ML) 0.083% nebulizer solution Take 2.5 mg by nebulization every 6 (six) hours as needed for wheezing or shortness of breath.    [name] alendronate (FOSAMAX) 70 MG tablet Take 1 tablet (70 mg total) by mouth every 7 (seven) days. Take with a full glass of water on an empty stomach. 4 tablet 11  . amLODipine (NORVASC) 2.5 MG tablet TAKE 1 TABLET BY MOUTH EVERY DAY 90 tablet 1  . aspirin EC 81 MG tablet Take 81 mg by mouth daily as  needed.     . fluticasone (FLONASE) 50 MCG/ACT nasal spray SPRAY 2 SPRAYS INTO EACH NOSTRIL EVERY DAY 48 mL 2  . fluticasone (FLOVENT HFA) 110 MCG/ACT inhaler Inhale 1 puff into the lungs in the morning and at bedtime. 1 each 12  . hydrOXYzine (VISTARIL) 25 MG capsule Take 1 capsule (25 mg total) by mouth 3 (three) times daily as needed. 30 capsule 2  . levofloxacin (LEVAQUIN) 250 MG tablet Take 1 tablet (250 mg total) by mouth daily. 7 tablet 0  . lithium carbonate (ESKALITH) 450 MG CR tablet Take 1 tablet (450 mg total) by mouth 2 (two) times daily. 180 tablet 0  . nortriptyline (PAMELOR) 50 MG capsule Take 1 capsule (50 mg total) by mouth at bedtime. 90 capsule 0  . triamcinolone cream (KENALOG) 0.1 % Apply 1 application topically 2 (two) times daily. 30 g 1  . venlafaxine (EFFEXOR) 75 MG tablet Take 1 tablet (75 mg total) by mouth daily. 90 tablet 0  . VENTOLIN HFA 108 (90 Base) MCG/ACT inhaler TAKE 2 PUFFS BY MOUTH EVERY 6 HOURS AS  NEEDED FOR WHEEZE OR SHORTNESS OF BREATH 18 each 0  . Vitamin D, Ergocalciferol, (DRISDOL) 1.25 MG (50000 UNIT) CAPS capsule TAKE 1 CAPSULE (50,000 UNITS TOTAL) BY MOUTH EVERY 7 (SEVEN) DAYS. (Patient not taking: Reported on 02/08/2021) 12 capsule 0   No current facility-administered medications for this visit.    Allergies  Allergen Reactions  . Sulfa Drugs Cross Reactors Hives and Rash  . Ceclor [Cefaclor] Hives    Tolerated ceftazidime December 2016  . Depakote Er [Divalproex Sodium Er] Other (See Comments)    Patient remarked that when she was taking this TWICE a day, she became "clumsy" and felt more prone to stumbling  . Escitalopram Oxalate Other (See Comments)    Pt does not recall ever taking medication (??)  . Latex Itching  . Sertraline Hcl Other (See Comments)    Severe headaches   . Tape Itching and Rash    Band-Aids, also (PAPER TAPE IS TOLERATED)    Social History   Socioeconomic History  . Marital status: Married    Spouse name:  Raylon Lamson  . Number of children: 2  . Years of education: Not on file  . Highest education level: Associate degree: occupational, Hotel manager, or vocational program  Occupational History  . Occupation: Chemical engineer: [employer]    Comment: [employer]  Tobacco Use  . Smoking status: Current Every Day Smoker    Packs/day: 0.50    Years: 46.00    Pack years: 23.00    Types: Cigarettes  . Smokeless tobacco: Never Used  Vaping Use  . Vaping Use: Never used  Substance and Sexual Activity  . Alcohol use: Yes    Alcohol/week: 0.0 standard drinks    Comment: 09/18/2017 "might have 1 drink/month; if that"  . Drug use: No  . Sexual activity: Not Currently  Other Topics Concern  . Not on file  Social History Narrative   Patient currently lives with her husband.    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 No bird or hot tub exposure. No mold exposure. No recent travel.   Social Determinants of Health   Financial Resource Strain: Not on file  Food Insecurity: Not on file  Transportation Needs: Not on file  Physical Activity: Not on file  Stress: Not on file  Social Connections: Not on file  Intimate Partner Violence: Not on file    ROS Per hpi   Objective   Vitals as reported by the patient: There were no vitals filed for this visit.  Neelam was seen today for cough.  Diagnoses and all orders for this visit:  Lower respiratory infection -     levofloxacin (LEVAQUIN) 250 MG tablet; Take 1 tablet (250 mg total) by mouth daily. -     fluticasone (FLOVENT HFA) 110 MCG/ACT inhaler; Inhale 1 puff into the lungs in the morning and at bedtime.  COPD exacerbation (HCC) -     levofloxacin (LEVAQUIN) 250 MG tablet; Take 1 tablet (250 mg total) by mouth daily. -     fluticasone (FLOVENT HFA) 110 MCG/ACT inhaler; Inhale 1 puff into the lungs in the morning and at bedtime.  Incidental lung  nodule, greater than or equal to 53mm -     CT CHEST NODULE FOLLOW UP LOW DOSE W/O; Future   PLAN  Copd exacerbation vs asthma vs pna  Very concerning  nodule given her history. Must repeat imaging ASAP to rule out malignancy or develop further follow up plan for diagnostics. Explained the importance of this to Ms Avis who voiced understanding  levaquin 250mg  po qd for one week. Discussed r/b/se of this medication including potential for qt prolongation. She voices understanding of this concern and will seek care if warranted.   Flovent - one puff twice daily. Continue albuterol  ER precautions reviewed in depth  Patient encouraged to call clinic with any questions, comments, or concerns.   I discussed the assessment and treatment plan with the patient. The patient was provided an opportunity to ask questions and all were answered. The patient agreed with the plan and demonstrated an understanding of the instructions.   The patient was advised to call back or seek an in-person evaluation if the symptoms worsen or if the condition fails to improve as anticipated.  I provided 30 minutes of non-face-to-face time during this encounter.  Maximiano Coss, NP  Primary Care at [facility]

## 2021-02-08 NOTE — Patient Instructions (Signed)
° ° ° °  If you have lab work done today you will be contacted with your lab results within the next 2 weeks.  If you have not heard from us then please contact us. The fastest way to get your results is to register for My Chart. ° ° °IF you received an x-ray today, you will receive an invoice from [facility]. Please contact [facility] at [phone] with questions or concerns regarding your invoice.  ° °IF you received labwork today, you will receive an invoice from [facility]. Please contact [facility] at [phone] with questions or concerns regarding your invoice.  ° °Our billing staff will not be able to assist you with questions regarding bills from these companies. ° °You will be contacted with the lab results as soon as they are available. The fastest way to get your results is to activate your My Chart account. Instructions are located on the last page of this paperwork. If you have not heard from us regarding the results in 2 weeks, please contact this office. °  ° ° ° °

## 2021-02-14 ENCOUNTER — Telehealth: Payer: Self-pay | Admitting: Registered Nurse

## 2021-02-15 ENCOUNTER — Other Ambulatory Visit: Payer: Self-pay

## 2021-02-15 ENCOUNTER — Telehealth (INDEPENDENT_AMBULATORY_CARE_PROVIDER_SITE_OTHER): Payer: Medicare PPO | Admitting: Registered Nurse

## 2021-02-15 DIAGNOSIS — L03115 Cellulitis of right lower limb: Secondary | ICD-10-CM | POA: Diagnosis not present

## 2021-02-15 DIAGNOSIS — J302 Other seasonal allergic rhinitis: Secondary | ICD-10-CM

## 2021-02-15 DIAGNOSIS — L03116 Cellulitis of left lower limb: Secondary | ICD-10-CM | POA: Diagnosis not present

## 2021-02-15 MED ORDER — BENZONATATE 100 MG PO CAPS: 100.0000 mg | ORAL_CAPSULE | Freq: Two times a day (BID) | ORAL | 0 refills | Status: DC | PRN

## 2021-02-15 MED ORDER — TRAMADOL HCL 50 MG PO TABS: 50.0000 mg | ORAL_TABLET | Freq: Three times a day (TID) | ORAL | 0 refills | Status: AC | PRN

## 2021-02-15 MED ORDER — DOXYCYCLINE HYCLATE 100 MG PO TABS: 100.0000 mg | ORAL_TABLET | Freq: Two times a day (BID) | ORAL | 0 refills | Status: DC

## 2021-02-15 MED ORDER — CETIRIZINE HCL 10 MG PO TABS: 10.0000 mg | ORAL_TABLET | Freq: Every day | ORAL | 11 refills | Status: AC

## 2021-02-15 NOTE — Progress Notes (Signed)
Telemedicine Encounter- SOAP NOTE Established Patient  This telephone encounter was conducted with the patient's (or proxy's) verbal consent via audio telecommunications: yes  Patient was instructed to have this encounter in a suitably private space; and to only have persons present to whom they give permission to participate. In addition, patient identity was confirmed by use of name plus two identifiers (DOB and address).  I discussed the limitations, risks, security and privacy concerns of performing an evaluation and management service by telephone and the availability of in person appointments. I also discussed with the patient that there may be a patient responsible charge related to this service. The patient expressed understanding and agreed to proceed.  I spent a total of 22 minutes talking with the patient or their proxy.  Patient at home Provider in office   Chief Complaint  Patient presents with  . Cellulitis    Pt reports some  improvement but wants to be sure doing ok  . Sinusitis    Pt having drainage and facial pain light cough, pt is requesting something for pain due to both conditions     Subjective   Catherine Kelley is a 70 y.o. established patient. Telephone visit today for cellulitis and sinusitis  HPI Cellulitis Ongoing recurrence. Has had this a number of times before. Has been doing nonpharm adequately with leg elevation, pressure, and monitoring skin integrity. However infection seems to be progressing. Thinks she will need abx  Sinus pressure On and off for the past 2 weeks, but getting more consistent. Has irritation in her throat. Pressure in ears. Has been coughing more often, noted streaks of blood in sputum. No shob, doe, chest pain, or sudden changes to weight. We are still waiting to hear from imaging in regards to scheduling her CT of chest for suspicious nodules. Given her hx, significant concern for malignancy.   Pain: Ongoing from each of the  above condition as well as flare of chronic lower back pain. Has been on tramadol in the past using 1-2 tabs daily as needed with good effect. No new qualities to back pain. No new radicular symptoms. No saddle symptoms.  Patient Active Problem List   Diagnosis Date Noted  . Vitamin D deficiency 07/06/2020  . Cellulitis of right lower extremity 10/22/2019  . Cellulitis 10/21/2019  . Bipolar 1 disorder ([place]) 10/11/2019  . Tobacco dependence due to cigarettes 10/11/2019  . Fall as cause of accidental injury at home as place of occurrence 10/11/2019  . Traumatic hematoma of knee, right, initial encounter 10/11/2019  . Lower respiratory infection 12/22/2018  . Senile ecchymosis 09/11/2018  . CAP (community acquired pneumonia) 09/17/2017  . Near syncope 09/17/2017  . Tobacco abuse 02/12/2016  . Other fatigue 02/04/2016  . Respiratory failure with hypoxia ([place]) 01/09/2016  . Malnutrition of moderate degree 11/03/2015  . Pulmonary emphysema ([place])   . Essential hypertension 09/27/2015  . Rhabdomyolysis 08/16/2015  . Cough 06/07/2015  . Bronchiectasis with acute exacerbation ([place]) 03/07/2015  . Cigarette smoker 12/13/2014  . Stage 2 moderate COPD by GOLD classification ([place]) 10/11/2014  . Lumbar back pain with radiculopathy affecting right lower extremity 09/28/2014  . Hyponatremia 07/09/2014  . Hx of recurrent pneumonia 07/08/2014  . Venous insufficiency (chronic) (peripheral) 09/29/2012  . Lower extremity edema 03/09/2012  . Benign hypertensive heart disease without heart failure 05/09/2011  . Chest pain 05/09/2011  . Dyslipidemia 05/09/2011  . Mood disorder ([place]) 05/09/2011  . Osteoarthritis 05/09/2011    Past Medical History:  Diagnosis Date  . Anxiety   . Arthritis    "jooints" (09/18/2017)  . Asthma   . Bipolar disorder ([place])   . Bipolar I disorder ([place]) 10/11/2019  . CAP (community acquired pneumonia) 09/18/2017  . Cat allergies   . Chronic bronchitis ([place])    "get it  alot; maybe not q yr" (07/06/2014)  . Chronic lower back pain    "spurs and pinched nerves" (09/18/2017)  . COPD (chronic obstructive pulmonary disease) ([place])   . DDD (degenerative disc disease), lumbar   . Depression    psychiatrist Dr. Toy Care  . Diastolic dysfunction   . Emphysematous COPD ([place])    changes in right base along with patchy areas on last x-ray  . Fall as cause of accidental injury at home as place of occurrence 10/11/2019  . Fall involving wheelchair as cause of accidental injury 10/11/2019  . Falls frequently    "several in 2017; fell at dr's office yesterday" (09/18/2017)  . GERD (gastroesophageal reflux disease)   . HCAP (healthcare-associated pneumonia) 02/12/2016  . Heart murmur   . History of cardiovascular stress test 08/15/2004   EF of 70% -- Normal stress cardiolite.  There is no evidence of ischemia and there is normal LV function. -- [name]. MD  . History of echocardiogram 12/24/2006   Est. EF of 09-38% NORMAL LV SYSTOLIC FUNCTION WITH IMPAIRED RELAXATION -- MILD AORTIC SCLEROSIS -- NORMAL PALONARY ARTERY PRESSURE -- NO OLD ECHOS FOR COMPARISON -- [name], MD  . Hypertension    essential hypertension  . Migraine    "when I was having my periods; none since then" (09/18/2017)  . Necrotic pneumonia ([place])    recurrent/notes 02/12/2016  . Pneumonia    "several times since May 2015" (07/06/2014)  . Pollen allergies   . Tobacco abuse    smoking about 1/2 pk of cigarettes a day  . Tobacco dependence due to cigarettes 10/11/2019  . Traumatic hematoma of knee, right, initial encounter 10/11/2019    Current Outpatient Medications  Medication Sig Dispense Refill  . Acetaminophen (TYLENOL ARTHRITIS PAIN PO) Take by mouth.    [name] albuterol (PROVENTIL) (2.5 MG/3ML) 0.083% nebulizer solution Take 2.5 mg by nebulization every 6 (six) hours as needed for wheezing or shortness of breath.    [name] alendronate (FOSAMAX) 70 MG tablet Take 1 tablet (70 mg total) by  mouth every 7 (seven) days. Take with a full glass of water on an empty stomach. 4 tablet 11  . amLODipine (NORVASC) 2.5 MG tablet TAKE 1 TABLET BY MOUTH EVERY DAY 90 tablet 1  . aspirin EC 81 MG tablet Take 81 mg by mouth daily as needed.     . benzonatate (TESSALON) 100 MG capsule Take 1 capsule (100 mg total) by mouth 2 (two) times daily as needed for cough. 20 capsule 0  . cetirizine (ZYRTEC) 10 MG tablet Take 1 tablet (10 mg total) by mouth daily. 30 tablet 11  . doxycycline (VIBRA-TABS) 100 MG tablet Take 1 tablet (100 mg total) by mouth 2 (two) times daily. 20 tablet 0  . fluticasone (FLONASE) 50 MCG/ACT nasal spray SPRAY 2 SPRAYS INTO EACH NOSTRIL EVERY DAY 48 mL 2  . fluticasone (FLOVENT HFA) 110 MCG/ACT inhaler Inhale 1 puff into the lungs in the morning and at bedtime. 1 each 12  . hydrOXYzine (VISTARIL) 25 MG capsule Take 1 capsule (25 mg total) by mouth 3 (three) times daily as needed. 30 capsule 2  . levofloxacin (LEVAQUIN) 250 MG  tablet Take 1 tablet (250 mg total) by mouth daily. 7 tablet 0  . lithium carbonate (ESKALITH) 450 MG CR tablet Take 1 tablet (450 mg total) by mouth 2 (two) times daily. 180 tablet 0  . nortriptyline (PAMELOR) 50 MG capsule Take 1 capsule (50 mg total) by mouth at bedtime. 90 capsule 0  . traMADol (ULTRAM) 50 MG tablet Take 1 tablet (50 mg total) by mouth every 8 (eight) hours as needed for up to 5 days. 15 tablet 0  . triamcinolone cream (KENALOG) 0.1 % Apply 1 application topically 2 (two) times daily. 30 g 1  . venlafaxine (EFFEXOR) 75 MG tablet Take 1 tablet (75 mg total) by mouth daily. 90 tablet 0  . VENTOLIN HFA 108 (90 Base) MCG/ACT inhaler TAKE 2 PUFFS BY MOUTH EVERY 6 HOURS AS NEEDED FOR WHEEZE OR SHORTNESS OF BREATH 18 each 0  . Vitamin D, Ergocalciferol, (DRISDOL) 1.25 MG (50000 UNIT) CAPS capsule TAKE 1 CAPSULE (50,000 UNITS TOTAL) BY MOUTH EVERY 7 (SEVEN) DAYS. 12 capsule 0   No current facility-administered medications for this visit.     Allergies  Allergen Reactions  . Sulfa Drugs Cross Reactors Hives and Rash  . Ceclor [Cefaclor] Hives    Tolerated ceftazidime December 2016  . Depakote Er [Divalproex Sodium Er] Other (See Comments)    Patient remarked that when she was taking this TWICE a day, she became "clumsy" and felt more prone to stumbling  . Escitalopram Oxalate Other (See Comments)    Pt does not recall ever taking medication (??)  . Latex Itching  . Sertraline Hcl Other (See Comments)    Severe headaches   . Tape Itching and Rash    Band-Aids, also (PAPER TAPE IS TOLERATED)    Social History   Socioeconomic History  . Marital status: Married    Spouse name: Amador Braddy  . Number of children: 2  . Years of education: Not on file  . Highest education level: Associate degree: occupational, Hotel manager, or vocational program  Occupational History  . Occupation: Chemical engineer: [employer]    Comment: [employer]  Tobacco Use  . Smoking status: Current Every Day Smoker    Packs/day: 0.50    Years: 46.00    Pack years: 23.00    Types: Cigarettes  . Smokeless tobacco: Never Used  Vaping Use  . Vaping Use: Never used  Substance and Sexual Activity  . Alcohol use: Yes    Alcohol/week: 0.0 standard drinks    Comment: 09/18/2017 "might have 1 drink/month; if that"  . Drug use: No  . Sexual activity: Not Currently  Other Topics Concern  . Not on file  Social History Narrative   Patient currently lives with her husband.    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 No bird or hot tub exposure. No mold exposure. No recent travel.   Social Determinants of Health   Financial Resource Strain: Not on file  Food Insecurity: Not on file  Transportation Needs: Not on file  Physical Activity: Not on file  Stress: Not on file  Social Connections: Not on file  Intimate Partner Violence: Not on file     ROS Per hpi   Objective   Vitals as reported by the patient: There were no vitals filed for this visit.  Vida was seen today for cellulitis and sinusitis.  Diagnoses and all orders for this visit:  Cellulitis of both lower extremities -     doxycycline (VIBRA-TABS) 100 MG tablet; Take 1 tablet (100 mg total) by mouth 2 (two) times daily. -     traMADol (ULTRAM) 50 MG tablet; Take 1 tablet (50 mg total) by mouth every 8 (eight) hours as needed for up to 5 days.  Seasonal allergies -     cetirizine (ZYRTEC) 10 MG tablet; Take 1 tablet (10 mg total) by mouth daily. -     benzonatate (TESSALON) 100 MG capsule; Take 1 capsule (100 mg total) by mouth 2 (two) times daily as needed for cough.   PLAN  Doxycycline as above, zyrtec and tessalon for upper respiratory and cough.  Return prn  Reviewed in depth strict ER precautions, in particular with hemoptysis occurring, I worry about malignancy. Pt voices understanding of these precautions  Return for in office assessment when able. Ideally next week.  Patient encouraged to call clinic with any questions, comments, or concerns.  I discussed the assessment and treatment plan with the patient. The patient was provided an opportunity to ask questions and all were answered. The patient agreed with the plan and demonstrated an understanding of the instructions.   The patient was advised to call back or seek an in-person evaluation if the symptoms worsen or if the condition fails to improve as anticipated.  I provided 22 minutes of non-face-to-face time during this encounter.  Maximiano Coss, NP  Primary Care at [facility]

## 2021-03-24 ENCOUNTER — Other Ambulatory Visit (HOSPITAL_COMMUNITY): Payer: Self-pay | Admitting: Psychiatry

## 2021-03-24 ENCOUNTER — Other Ambulatory Visit: Payer: Self-pay | Admitting: Registered Nurse

## 2021-03-24 DIAGNOSIS — F411 Generalized anxiety disorder: Secondary | ICD-10-CM

## 2021-03-27 ENCOUNTER — Ambulatory Visit: Payer: Medicare PPO | Admitting: Registered Nurse

## 2021-03-27 ENCOUNTER — Other Ambulatory Visit: Payer: Self-pay

## 2021-03-27 ENCOUNTER — Encounter: Payer: Self-pay | Admitting: Registered Nurse

## 2021-03-27 VITALS — BP 133/69 | HR 83 | Temp 98.0°F | Resp 18 | Ht 63.0 in | Wt 108.6 lb

## 2021-03-27 DIAGNOSIS — L03115 Cellulitis of right lower limb: Secondary | ICD-10-CM | POA: Diagnosis not present

## 2021-03-27 DIAGNOSIS — L0390 Cellulitis, unspecified: Secondary | ICD-10-CM | POA: Diagnosis not present

## 2021-03-27 DIAGNOSIS — L03116 Cellulitis of left lower limb: Secondary | ICD-10-CM

## 2021-03-27 DIAGNOSIS — L603 Nail dystrophy: Secondary | ICD-10-CM | POA: Diagnosis not present

## 2021-03-27 MED ORDER — DOXYCYCLINE HYCLATE 100 MG PO TABS: 100.0000 mg | ORAL_TABLET | Freq: Two times a day (BID) | ORAL | 0 refills | Status: DC

## 2021-03-27 NOTE — Progress Notes (Signed)
Established Patient Office Visit  Subjective:  Patient ID: Catherine Kelley, female    DOB: 1951/05/13  Age: 70 y.o. MRN: [id number]  CC:  Chief Complaint  Patient presents with  . Cellulitis    Patient states she is here for swelling in both feet since last week and getting worse. The right foot is starting to blister in 4 different spots that has now gone away.she took some tylenol and tramadol.    HPI Catherine Kelley presents for cellulitis  Has been recurrent with swelling, weeping, and blistering of lower extremities.  Has some varicose veins Has not been seen by vascular in the past, at least not to her knowledge. Tolerates doxycycline well but has had reaction to erythromycin and allergic reaction to sulfa meds.  Last occurrence of this was in March. Responded well to treatment  She does wear compression stockings for much of the day but admits that she spends a good amount of time walking around her home barefoot. She states that she may have sustained an injury that way but isn't sure. She denies numbness, neuropathies, pain, and tingling  No systemic symptoms of note.   She does note that she has been able to cut her toe nails. She usually cuts them with scissors but is afraid to given her recurrent infection. She does have a cracked nail that is not healing well. Otherwise nails do not seem brittle or infected, but have been somewhat thick.  Past Medical History:  Diagnosis Date  . Anxiety   . Arthritis    "jooints" (09/18/2017)  . Asthma   . Bipolar disorder (Catherine Kelley)   . Bipolar I disorder ([name]) 10/11/2019  . CAP (community acquired pneumonia) 09/18/2017  . Cat allergies   . Chronic bronchitis ([place])    "get it alot; maybe not q yr" (07/06/2014)  . Chronic lower back pain    "spurs and pinched nerves" (09/18/2017)  . COPD (chronic obstructive pulmonary disease) ([place])   . DDD (degenerative disc disease), lumbar   . Depression    psychiatrist Dr. Toy Care  . Diastolic dysfunction    . Emphysematous COPD ([place])    changes in right base along with patchy areas on last x-ray  . Fall as cause of accidental injury at home as place of occurrence 10/11/2019  . Fall involving wheelchair as cause of accidental injury 10/11/2019  . Falls frequently    "several in 2017; fell at dr's office yesterday" (09/18/2017)  . GERD (gastroesophageal reflux disease)   . HCAP (healthcare-associated pneumonia) 02/12/2016  . Heart murmur   . History of cardiovascular stress test 08/15/2004   EF of 70% -- Normal stress cardiolite.  There is no evidence of ischemia and there is normal LV function. -- Catherine Kelley. MD  . History of echocardiogram 12/24/2006   Est. EF of 62-69% NORMAL LV SYSTOLIC FUNCTION WITH IMPAIRED RELAXATION -- MILD AORTIC SCLEROSIS -- NORMAL PALONARY ARTERY PRESSURE -- NO OLD ECHOS FOR COMPARISON -- Catherine Coco, MD  . Hypertension    essential hypertension  . Migraine    "when I was having my periods; none since then" (09/18/2017)  . Necrotic pneumonia ([place])    recurrent/notes 02/12/2016  . Pneumonia    "several times since May 2015" (07/06/2014)  . Pollen allergies   . Tobacco abuse    smoking about 1/2 pk of cigarettes a day  . Tobacco dependence due to cigarettes 10/11/2019  . Traumatic hematoma of knee, right, initial encounter 10/11/2019  Past Surgical History:  Procedure Laterality Date  . CAROTID STENT    . DILATION AND CURETTAGE OF UTERUS    . TUBAL LIGATION  1986    Family History  Problem Relation Age of Onset  . Stroke Mother   . Heart disease Father   . Hyperlipidemia Father   . Hypertension Father   . Breast cancer Maternal Aunt   . Asthma Maternal Grandmother     Social History   Socioeconomic History  . Marital status: Married    Spouse name: Marquan Vokes  . Number of children: 2  . Years of education: Not on file  . Highest education level: Associate degree: occupational, Hotel manager, or vocational program  Occupational History   . Occupation: Chemical engineer: [employer]    Comment: [employer]  Tobacco Use  . Smoking status: Current Every Day Smoker    Packs/day: 0.50    Years: 46.00    Pack years: 23.00    Types: Cigarettes  . Smokeless tobacco: Never Used  Vaping Use  . Vaping Use: Never used  Substance and Sexual Activity  . Alcohol use: Yes    Alcohol/week: 0.0 standard drinks    Comment: 09/18/2017 "might have 1 drink/month; if that"  . Drug use: No  . Sexual activity: Not Currently  Other Topics Concern  . Not on file  Social History Narrative   Patient currently lives with her husband.    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 No bird or hot tub exposure. No mold exposure. No recent travel.   Social Determinants of Health   Financial Resource Strain: Not on file  Food Insecurity: Not on file  Transportation Needs: Not on file  Physical Activity: Not on file  Stress: Not on file  Social Connections: Not on file  Intimate Partner Violence: Not on file    Outpatient Medications Prior to Visit  Medication Sig Dispense Refill  . Acetaminophen (TYLENOL ARTHRITIS PAIN PO) Take by mouth.    Catherine Kelley albuterol (PROVENTIL) (2.5 MG/3ML) 0.083% nebulizer solution Take 2.5 mg by nebulization every 6 (six) hours as needed for wheezing or shortness of breath.    Catherine Kelley albuterol (VENTOLIN HFA) 108 (90 Base) MCG/ACT inhaler TAKE 2 PUFFS BY MOUTH EVERY 6 HOURS AS NEEDED FOR WHEEZE OR SHORTNESS OF BREATH 18 each 0  . alendronate (FOSAMAX) 70 MG tablet Take 1 tablet (70 mg total) by mouth every 7 (seven) days. Take with a full glass of water on an empty stomach. 4 tablet 11  . amLODipine (NORVASC) 2.5 MG tablet TAKE 1 TABLET BY MOUTH EVERY DAY 90 tablet 1  . aspirin EC 81 MG tablet Take 81 mg by mouth daily as needed.     . benzonatate (TESSALON) 100 MG capsule Take 1 capsule (100 mg total) by mouth 2 (two) times daily as  needed for cough. 20 capsule 0  . cetirizine (ZYRTEC) 10 MG tablet Take 1 tablet (10 mg total) by mouth daily. 30 tablet 11  . fluticasone (FLONASE) 50 MCG/ACT nasal spray SPRAY 2 SPRAYS INTO EACH NOSTRIL EVERY DAY 48 mL 2  . fluticasone (FLOVENT HFA) 110 MCG/ACT inhaler Inhale 1 puff into the lungs in the morning and at bedtime. 1 each 12  . hydrOXYzine (VISTARIL) 25 MG capsule Take 1 capsule (25 mg total) by mouth 3 (three) times daily as needed. [place]  capsule 2  . levofloxacin (LEVAQUIN) 250 MG tablet Take 1 tablet (250 mg total) by mouth daily. 7 tablet 0  . lithium carbonate (ESKALITH) 450 MG CR tablet Take 1 tablet (450 mg total) by mouth 2 (two) times daily. 180 tablet 0  . nortriptyline (PAMELOR) 50 MG capsule Take 1 capsule (50 mg total) by mouth at bedtime. 90 capsule 0  . triamcinolone cream (KENALOG) 0.1 % Apply 1 application topically 2 (two) times daily. 30 g 1  . venlafaxine (EFFEXOR) 75 MG tablet Take 1 tablet (75 mg total) by mouth daily. 90 tablet 0  . Vitamin D, Ergocalciferol, (DRISDOL) 1.25 MG (50000 UNIT) CAPS capsule TAKE 1 CAPSULE (50,000 UNITS TOTAL) BY MOUTH EVERY 7 (SEVEN) DAYS 12 capsule 0  . doxycycline (VIBRA-TABS) 100 MG tablet Take 1 tablet (100 mg total) by mouth 2 (two) times daily. 20 tablet 0   No facility-administered medications prior to visit.    Allergies  Allergen Reactions  . Sulfa Drugs Cross Reactors Hives and Rash  . Ceclor [Cefaclor] Hives    Tolerated ceftazidime December 2016  . Depakote Er [Divalproex Sodium Er] Other (See Comments)    Patient remarked that when she was taking this TWICE a day, she became "clumsy" and felt more prone to stumbling  . Escitalopram Oxalate Other (See Comments)    Pt does not recall ever taking medication (??)  . Latex Itching  . Sertraline Hcl Other (See Comments)    Severe headaches   . Tape Itching and Rash    Band-Aids, also (PAPER TAPE IS TOLERATED)    ROS Review of Systems Per hpi     Objective:     Physical Exam Vitals and nursing note reviewed.  Constitutional:      General: She is not in acute distress.    Appearance: Normal appearance. She is normal weight. She is not ill-appearing, toxic-appearing or diaphoretic.  Cardiovascular:     Rate and Rhythm: Normal rate and regular rhythm.     Heart sounds: Normal heart sounds. No murmur heard. No friction rub. No gallop.   Pulmonary:     Effort: Pulmonary effort is normal. No respiratory distress.     Breath sounds: Normal breath sounds. No stridor. No wheezing, rhonchi or rales.  Chest:     Chest wall: No tenderness.  Musculoskeletal:     Right lower leg: Edema (cool to touch, +1 pitting) present.     Left lower leg: Edema (cool to touch, +1 pitting) present.  Skin:    General: Skin is warm and dry.     Comments: Toe nails appear thick and brittle. L great toe has crack that extends vertically about half the height of the nail. No apparent infection Chipped nails - either result of brittleness or poor maintenance.   Neurological:     General: No focal deficit present.     Mental Status: She is alert and oriented to person, place, and time. Mental status is at baseline.  Psychiatric:        Mood and Affect: Mood normal.        Behavior: Behavior normal.        Thought Content: Thought content normal.        Judgment: Judgment normal.     BP 133/69   Pulse 83   Temp 98 F (36.7 C) (Temporal)   Resp 18   Ht 5\' 3"  (1.6 m)   Wt 108 lb 9.6 oz (49.3 kg)   SpO2 99%  BMI 19.24 kg/m  Wt Readings from Last 3 Encounters:  03/27/21 108 lb 9.6 oz (49.3 kg)  11/08/20 114 lb 10.2 oz (52 kg)  09/21/20 114 lb 12.8 oz (52.1 kg)     There are no preventive care reminders to display for this patient.  There are no preventive care reminders to display for this patient.  Lab Results  Component Value Date   TSH 7.598 (H) 11/08/2020   Lab Results  Component Value Date   WBC 10.4 11/08/2020   HGB 15.0 11/08/2020   HCT 44.5  11/08/2020   MCV 91.0 11/08/2020   PLT 170 11/08/2020   Lab Results  Component Value Date   NA 135 11/08/2020   K 3.5 11/08/2020   CO2 25 11/08/2020   GLUCOSE 88 11/08/2020   BUN 13 11/08/2020   CREATININE 0.65 11/08/2020   BILITOT 0.7 11/08/2020   ALKPHOS 154 (H) 11/08/2020   AST 24 11/08/2020   ALT 20 11/08/2020   PROT 7.2 11/08/2020   ALBUMIN 3.8 11/08/2020   CALCIUM 9.9 11/08/2020   ANIONGAP 10 11/08/2020   GFR 186.01 02/04/2016   Lab Results  Component Value Date   CHOL 160 11/08/2020   Lab Results  Component Value Date   HDL 73 11/08/2020   Lab Results  Component Value Date   LDLCALC 72 11/08/2020   Lab Results  Component Value Date   TRIG 73 11/08/2020   Lab Results  Component Value Date   CHOLHDL 2.2 11/08/2020   Lab Results  Component Value Date   HGBA1C 4.8 11/08/2020      Assessment & Plan:   Problem List Items Addressed This Visit   None   Visit Diagnoses    Cellulitis of both lower extremities    -  Primary   Relevant Medications   doxycycline (VIBRA-TABS) 100 MG tablet   Other Relevant Orders   Ambulatory referral to Vascular Surgery   Chronic cellulitis       Brittle nails       Relevant Orders   Ambulatory referral to Podiatry      Meds ordered this encounter  Medications  . doxycycline (VIBRA-TABS) 100 MG tablet    Sig: Take 1 tablet (100 mg total) by mouth 2 (two) times daily.    Dispense:  20 tablet    Refill:  0    Order Specific Question:   Supervising Provider    Answer:   Carlota Raspberry, JEFFREY R [[id number]]    Follow-up: No follow-ups on file.   PLAN  Very similar in appearance to previous occurrences but different in the temperature - both feet are cool to touch. She does have some varicose veins.   Suspect PVD - perhaps both venous and arterial? Needs Vascular consult, will order today  Will refer to podiatry for nail abnormalities and maintenance.   Doxycycline as the ulcers and weeping on her feet suggest infection,  or are prone to infection and given her circulatory deficiencies I would worry for rapid spread.  Patient encouraged to call clinic with any questions, comments, or concerns.   Maximiano Coss, NP

## 2021-03-27 NOTE — Patient Instructions (Signed)
° ° ° °  If you have lab work done today you will be contacted with your lab results within the next 2 weeks.  If you have not heard from us then please contact us. The fastest way to get your results is to register for My Chart. ° ° °IF you received an x-ray today, you will receive an invoice from [facility]. Please contact [facility] at [phone] with questions or concerns regarding your invoice.  ° °IF you received labwork today, you will receive an invoice from [facility]. Please contact [facility] at [phone] with questions or concerns regarding your invoice.  ° °Our billing staff will not be able to assist you with questions regarding bills from these companies. ° °You will be contacted with the lab results as soon as they are available. The fastest way to get your results is to activate your My Chart account. Instructions are located on the last page of this paperwork. If you have not heard from us regarding the results in 2 weeks, please contact this office. °  ° ° ° °

## 2021-04-05 ENCOUNTER — Telehealth (INDEPENDENT_AMBULATORY_CARE_PROVIDER_SITE_OTHER): Payer: Medicare PPO | Admitting: Registered Nurse

## 2021-04-05 ENCOUNTER — Encounter: Payer: Self-pay | Admitting: Registered Nurse

## 2021-04-05 ENCOUNTER — Other Ambulatory Visit: Payer: Self-pay

## 2021-04-05 DIAGNOSIS — G8929 Other chronic pain: Secondary | ICD-10-CM | POA: Diagnosis not present

## 2021-04-05 DIAGNOSIS — R0989 Other specified symptoms and signs involving the circulatory and respiratory systems: Secondary | ICD-10-CM

## 2021-04-05 MED ORDER — TRAMADOL HCL 50 MG PO TABS: 25.0000 mg | ORAL_TABLET | Freq: Two times a day (BID) | ORAL | 0 refills | Status: DC | PRN

## 2021-04-05 NOTE — Progress Notes (Signed)
Telemedicine Encounter- SOAP NOTE Established Patient  This telephone encounter was conducted with the patient's (or proxy's) verbal consent via audio telecommunications: yes  Patient was instructed to have this encounter in a suitably private space; and to only have persons present to whom they give permission to participate. In addition, patient identity was confirmed by use of name plus two identifiers (DOB and address).  I discussed the limitations, risks, security and privacy concerns of performing an evaluation and management service by telephone and the availability of in person appointments. I also discussed with the patient that there may be a patient responsible charge related to this service. The patient expressed understanding and agreed to proceed.  I spent a total of 15 minutes talking with the patient or their proxy.  Patient at home Provider in office  Participants: Kathrin Ruddy, NP and Hector Shade  Chief Complaint  Patient presents with  . Follow-up    Patient states she is follow up with leg pain and she is still having pain and only have a few antibiotics left and th    Subjective   Catherine Kelley is a 70 y.o. established patient. Telephone visit today for ongoing leg pain  HPI Unfortunately pain not relieved by abx. She has generally tolerated doxycycline well though admits that she did vomit 1-2 times when taking it on an empty stomach.  Last visit legs were generally feeling cool, some swelling, tenderness, redness, and some weeping. Now she notes her R leg is the same but L leg is more red, more painful, and more swollen. It is also notably warm both in feeling and to the touch. She notes the acute rise of a band of red around her calf. She has substantial claudication in this leg.   No hx of dvt to our knowledge. Known venous insufficiency. Had been referred to vascular but had not yet scheduled.  No shob, doe, headaches, hemoptysis  Patient Active Problem  List   Diagnosis Date Noted  . Vitamin D deficiency 07/06/2020  . Cellulitis of right lower extremity 10/22/2019  . Cellulitis 10/21/2019  . Bipolar 1 disorder ([place]) 10/11/2019  . Tobacco dependence due to cigarettes 10/11/2019  . Fall as cause of accidental injury at home as place of occurrence 10/11/2019  . Traumatic hematoma of knee, right, initial encounter 10/11/2019  . Lower respiratory infection 12/22/2018  . Senile ecchymosis 09/11/2018  . CAP (community acquired pneumonia) 09/17/2017  . Near syncope 09/17/2017  . Tobacco abuse 02/12/2016  . Other fatigue 02/04/2016  . Respiratory failure with hypoxia ([place]) 01/09/2016  . Malnutrition of moderate degree 11/03/2015  . Pulmonary emphysema ([place])   . Essential hypertension 09/27/2015  . Rhabdomyolysis 08/16/2015  . Cough 06/07/2015  . Bronchiectasis with acute exacerbation ([place]) 03/07/2015  . Cigarette smoker 12/13/2014  . Stage 2 moderate COPD by GOLD classification ([place]) 10/11/2014  . Lumbar back pain with radiculopathy affecting right lower extremity 09/28/2014  . Hyponatremia 07/09/2014  . Hx of recurrent pneumonia 07/08/2014  . Venous insufficiency (chronic) (peripheral) 09/29/2012  . Lower extremity edema 03/09/2012  . Benign hypertensive heart disease without heart failure 05/09/2011  . Chest pain 05/09/2011  . Dyslipidemia 05/09/2011  . Mood disorder ([place]) 05/09/2011  . Osteoarthritis 05/09/2011    Past Medical History:  Diagnosis Date  . Anxiety   . Arthritis    "jooints" (09/18/2017)  . Asthma   . Bipolar disorder ([place])   . Bipolar I disorder ([place]) 10/11/2019  . CAP (community acquired  pneumonia) 09/18/2017  . Cat allergies   . Chronic bronchitis ([place])    "get it alot; maybe not q yr" (07/06/2014)  . Chronic lower back pain    "spurs and pinched nerves" (09/18/2017)  . COPD (chronic obstructive pulmonary disease) ([place])   . DDD (degenerative disc disease), lumbar   . Depression    psychiatrist Dr. Toy Care   . Diastolic dysfunction   . Emphysematous COPD ([place])    changes in right base along with patchy areas on last x-ray  . Fall as cause of accidental injury at home as place of occurrence 10/11/2019  . Fall involving wheelchair as cause of accidental injury 10/11/2019  . Falls frequently    "several in 2017; fell at dr's office yesterday" (09/18/2017)  . GERD (gastroesophageal reflux disease)   . HCAP (healthcare-associated pneumonia) 02/12/2016  . Heart murmur   . History of cardiovascular stress test 08/15/2004   EF of 70% -- Normal stress cardiolite.  There is no evidence of ischemia and there is normal LV function. -- [name]. MD  . History of echocardiogram 12/24/2006   Est. EF of 87-56% NORMAL LV SYSTOLIC FUNCTION WITH IMPAIRED RELAXATION -- MILD AORTIC SCLEROSIS -- NORMAL PALONARY ARTERY PRESSURE -- NO OLD ECHOS FOR COMPARISON -- [name], MD  . Hypertension    essential hypertension  . Migraine    "when I was having my periods; none since then" (09/18/2017)  . Necrotic pneumonia ([place])    recurrent/notes 02/12/2016  . Pneumonia    "several times since May 2015" (07/06/2014)  . Pollen allergies   . Tobacco abuse    smoking about 1/2 pk of cigarettes a day  . Tobacco dependence due to cigarettes 10/11/2019  . Traumatic hematoma of knee, right, initial encounter 10/11/2019    Current Outpatient Medications  Medication Sig Dispense Refill  . Acetaminophen (TYLENOL ARTHRITIS PAIN PO) Take by mouth.    [name] albuterol (PROVENTIL) (2.5 MG/3ML) 0.083% nebulizer solution Take 2.5 mg by nebulization every 6 (six) hours as needed for wheezing or shortness of breath.    [name] albuterol (VENTOLIN HFA) 108 (90 Base) MCG/ACT inhaler TAKE 2 PUFFS BY MOUTH EVERY 6 HOURS AS NEEDED FOR WHEEZE OR SHORTNESS OF BREATH 18 each 0  . alendronate (FOSAMAX) 70 MG tablet Take 1 tablet (70 mg total) by mouth every 7 (seven) days. Take with a full glass of water on an empty stomach. 4 tablet 11  .  amLODipine (NORVASC) 2.5 MG tablet TAKE 1 TABLET BY MOUTH EVERY DAY 90 tablet 1  . aspirin EC 81 MG tablet Take 81 mg by mouth daily as needed.     . benzonatate (TESSALON) 100 MG capsule Take 1 capsule (100 mg total) by mouth 2 (two) times daily as needed for cough. 20 capsule 0  . cetirizine (ZYRTEC) 10 MG tablet Take 1 tablet (10 mg total) by mouth daily. 30 tablet 11  . doxycycline (VIBRA-TABS) 100 MG tablet Take 1 tablet (100 mg total) by mouth 2 (two) times daily. 20 tablet 0  . fluticasone (FLONASE) 50 MCG/ACT nasal spray SPRAY 2 SPRAYS INTO EACH NOSTRIL EVERY DAY 48 mL 2  . fluticasone (FLOVENT HFA) 110 MCG/ACT inhaler Inhale 1 puff into the lungs in the morning and at bedtime. 1 each 12  . hydrOXYzine (VISTARIL) 25 MG capsule Take 1 capsule (25 mg total) by mouth 3 (three) times daily as needed. 30 capsule 2  . levofloxacin (LEVAQUIN) 250 MG tablet Take 1 tablet (250 mg total)  by mouth daily. 7 tablet 0  . lithium carbonate (ESKALITH) 450 MG CR tablet Take 1 tablet (450 mg total) by mouth 2 (two) times daily. 180 tablet 0  . nortriptyline (PAMELOR) 50 MG capsule Take 1 capsule (50 mg total) by mouth at bedtime. 90 capsule 0  . triamcinolone cream (KENALOG) 0.1 % Apply 1 application topically 2 (two) times daily. 30 g 1  . venlafaxine (EFFEXOR) 75 MG tablet Take 1 tablet (75 mg total) by mouth daily. 90 tablet 0  . Vitamin D, Ergocalciferol, (DRISDOL) 1.25 MG (50000 UNIT) CAPS capsule TAKE 1 CAPSULE (50,000 UNITS TOTAL) BY MOUTH EVERY 7 (SEVEN) DAYS 12 capsule 0   No current facility-administered medications for this visit.    Allergies  Allergen Reactions  . Sulfa Drugs Cross Reactors Hives and Rash  . Ceclor [Cefaclor] Hives    Tolerated ceftazidime December 2016  . Depakote Er [Divalproex Sodium Er] Other (See Comments)    Patient remarked that when she was taking this TWICE a day, she became "clumsy" and felt more prone to stumbling  . Escitalopram Oxalate Other (See Comments)     Pt does not recall ever taking medication (??)  . Latex Itching  . Sertraline Hcl Other (See Comments)    Severe headaches   . Tape Itching and Rash    Band-Aids, also (PAPER TAPE IS TOLERATED)    Social History   Socioeconomic History  . Marital status: Married    Spouse name: Wille Aubuchon  . Number of children: 2  . Years of education: Not on file  . Highest education level: Associate degree: occupational, Hotel manager, or vocational program  Occupational History  . Occupation: Chemical engineer: [employer]    Comment: [employer]  Tobacco Use  . Smoking status: Current Every Day Smoker    Packs/day: 0.50    Years: 46.00    Pack years: 23.00    Types: Cigarettes  . Smokeless tobacco: Never Used  Vaping Use  . Vaping Use: Never used  Substance and Sexual Activity  . Alcohol use: Yes    Alcohol/week: 0.0 standard drinks    Comment: 09/18/2017 "might have 1 drink/month; if that"  . Drug use: No  . Sexual activity: Not Currently  Other Topics Concern  . Not on file  Social History Narrative   Patient currently lives with her husband.    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 No bird or hot tub exposure. No mold exposure. No recent travel.   Social Determinants of Health   Financial Resource Strain: Not on file  Food Insecurity: Not on file  Transportation Needs: Not on file  Physical Activity: Not on file  Stress: Not on file  Social Connections: Not on file  Intimate Partner Violence: Not on file    ROS  Objective   Vitals as reported by the patient: There were no vitals filed for this visit.  Tahra was seen today for follow-up.  Diagnoses and all orders for this visit:  Suspected DVT (deep vein thrombosis)   PLAN  With acute redness, warmth, and pain, need to rule out DVT  Pt recommended to proceed to ED. States she will [facility] to [facility].  Call  911 if shob, doe, thunderclap headache, hemoptysis, or other acute symptoms concerning for PE or CVA  Patient encouraged to call clinic with  any questions, comments, or concerns.   I discussed the assessment and treatment plan with the patient. The patient was provided an opportunity to ask questions and all were answered. The patient agreed with the plan and demonstrated an understanding of the instructions.   The patient was advised to call back or seek an in-person evaluation if the symptoms worsen or if the condition fails to improve as anticipated.  I provided 15 minutes of non-face-to-face time during this encounter.  Maximiano Coss, NP  Primary Care at [facility]

## 2021-04-05 NOTE — Patient Instructions (Signed)
° ° ° °  If you have lab work done today you will be contacted with your lab results within the next 2 weeks.  If you have not heard from us then please contact us. The fastest way to get your results is to register for My Chart. ° ° °IF you received an x-ray today, you will receive an invoice from [facility]. Please contact [facility] at [phone] with questions or concerns regarding your invoice.  ° °IF you received labwork today, you will receive an invoice from [facility]. Please contact [facility] at [phone] with questions or concerns regarding your invoice.  ° °Our billing staff will not be able to assist you with questions regarding bills from these companies. ° °You will be contacted with the lab results as soon as they are available. The fastest way to get your results is to activate your My Chart account. Instructions are located on the last page of this paperwork. If you have not heard from us regarding the results in 2 weeks, please contact this office. °  ° ° ° °

## 2021-04-10 ENCOUNTER — Emergency Department (HOSPITAL_COMMUNITY)
Admission: EM | Admit: 2021-04-10 | Discharge: 2021-04-10 | Disposition: A | Discharge status: Home / Self Care | Payer: Medicare PPO | Attending: Emergency Medicine | Admitting: Emergency Medicine

## 2021-04-10 ENCOUNTER — Encounter (HOSPITAL_COMMUNITY): Payer: Self-pay

## 2021-04-10 ENCOUNTER — Emergency Department (HOSPITAL_BASED_OUTPATIENT_CLINIC_OR_DEPARTMENT_OTHER): Payer: Medicare PPO

## 2021-04-10 ENCOUNTER — Other Ambulatory Visit: Payer: Self-pay

## 2021-04-10 DIAGNOSIS — M7989 Other specified soft tissue disorders: Secondary | ICD-10-CM | POA: Diagnosis not present

## 2021-04-10 DIAGNOSIS — L0390 Cellulitis, unspecified: Secondary | ICD-10-CM | POA: Diagnosis not present

## 2021-04-10 DIAGNOSIS — L03116 Cellulitis of left lower limb: Secondary | ICD-10-CM | POA: Insufficient documentation

## 2021-04-10 DIAGNOSIS — Z91040 Latex allergy status: Secondary | ICD-10-CM | POA: Diagnosis not present

## 2021-04-10 DIAGNOSIS — J45909 Unspecified asthma, uncomplicated: Secondary | ICD-10-CM | POA: Diagnosis not present

## 2021-04-10 DIAGNOSIS — Z79899 Other long term (current) drug therapy: Secondary | ICD-10-CM | POA: Insufficient documentation

## 2021-04-10 DIAGNOSIS — L538 Other specified erythematous conditions: Secondary | ICD-10-CM | POA: Diagnosis not present

## 2021-04-10 DIAGNOSIS — M79605 Pain in left leg: Secondary | ICD-10-CM

## 2021-04-10 DIAGNOSIS — Z7951 Long term (current) use of inhaled steroids: Secondary | ICD-10-CM | POA: Diagnosis not present

## 2021-04-10 DIAGNOSIS — I10 Essential (primary) hypertension: Secondary | ICD-10-CM | POA: Insufficient documentation

## 2021-04-10 DIAGNOSIS — J449 Chronic obstructive pulmonary disease, unspecified: Secondary | ICD-10-CM | POA: Diagnosis not present

## 2021-04-10 DIAGNOSIS — F17210 Nicotine dependence, cigarettes, uncomplicated: Secondary | ICD-10-CM | POA: Insufficient documentation

## 2021-04-10 MED ORDER — DOXYCYCLINE HYCLATE 100 MG PO CAPS: 100.0000 mg | ORAL_CAPSULE | Freq: Two times a day (BID) | ORAL | 0 refills | Status: AC

## 2021-04-10 NOTE — Discharge Instructions (Signed)
You still should follow-up with a vascular doctor to have your legs checked.  A referral was sent by her primary care doctor.   Prescription for doxycycline sent to your pharmacy.  This is to treat infection in your legs for a little bit longer.  Take as prescribed.  Follow-up with primary care doctor after completing antibiotics for wound check.

## 2021-04-10 NOTE — ED Notes (Signed)
Pt verbalized understanding of d/c instructions, follow up and medications. Pt transported to waiting family member in vehicle by this RN. NAD

## 2021-04-10 NOTE — ED Triage Notes (Signed)
Pt reports left leg redness & swelling & warm to the touch.Pt reports she was dx w/ cellulitis but states her PCP told her she might have a blood clot and should have an [name].

## 2021-04-10 NOTE — ED Provider Notes (Signed)
[facility] EMERGENCY DEPARTMENT Provider Note   CSN: [id number] Arrival date & time: 04/10/21  1123     History Chief Complaint  Patient presents with  . Leg Swelling    Catherine Kelley is a 70 y.o. female with past medical history significant for COPD, degenerative disc disease, diastolic dysfunction, heart murmur, tobacco abuse.  Not anticoagulated.  HPI Patient presents to emergency room today with chief complaint of left leg swelling x5 days.  Patient was seen by PCP 03/27/2021 for cellulitis of both lower extremities.  She was prescribed doxycycline.  An ambulatory referral was sent to vascular surgery as there was concern for PVD.  Patient states she took her doxycycline and felt like the infection was getting better.  Patient had video visit with PCP x5 days ago it was documented that her left leg was more red, painful and swollen compared to the right leg.  Both legs were noted to be warm to the touch and it was recommended to be evaluated in the ED to rule out DVT.  Patient is reporting a small area pain and swelling on her left shin.  She finished her doxycycline x4 days ago.  She describes the pain as an intermittent aching sensation.  She rates the pain 3 out of 10 in severity. She denies any fever, cough, shortness of breath or chest pain.  Denies any fall or injury to her legs.   Past Medical History:  Diagnosis Date  . Anxiety   . Arthritis    "jooints" (09/18/2017)  . Asthma   . Bipolar disorder ([place])   . Bipolar I disorder ([place]) 10/11/2019  . CAP (community acquired pneumonia) 09/18/2017  . Cat allergies   . Chronic bronchitis ([place])    "get it alot; maybe not q yr" (07/06/2014)  . Chronic lower back pain    "spurs and pinched nerves" (09/18/2017)  . COPD (chronic obstructive pulmonary disease) ([place])   . DDD (degenerative disc disease), lumbar   . Depression    psychiatrist Dr. Toy Care  . Diastolic dysfunction   . Emphysematous COPD ([place])    changes  in right base along with patchy areas on last x-ray  . Fall as cause of accidental injury at home as place of occurrence 10/11/2019  . Fall involving wheelchair as cause of accidental injury 10/11/2019  . Falls frequently    "several in 2017; fell at dr's office yesterday" (09/18/2017)  . GERD (gastroesophageal reflux disease)   . HCAP (healthcare-associated pneumonia) 02/12/2016  . Heart murmur   . History of cardiovascular stress test 08/15/2004   EF of 70% -- Normal stress cardiolite.  There is no evidence of ischemia and there is normal LV function. -- [name]. MD  . History of echocardiogram 12/24/2006   Est. EF of 62-69% NORMAL LV SYSTOLIC FUNCTION WITH IMPAIRED RELAXATION -- MILD AORTIC SCLEROSIS -- NORMAL PALONARY ARTERY PRESSURE -- NO OLD ECHOS FOR COMPARISON -- [name], MD  . Hypertension    essential hypertension  . Migraine    "when I was having my periods; none since then" (09/18/2017)  . Necrotic pneumonia ([place])    recurrent/notes 02/12/2016  . Pneumonia    "several times since May 2015" (07/06/2014)  . Pollen allergies   . Tobacco abuse    smoking about 1/2 pk of cigarettes a day  . Tobacco dependence due to cigarettes 10/11/2019  . Traumatic hematoma of knee, right, initial encounter 10/11/2019    Patient Active Problem List  Diagnosis Date Noted  . Vitamin D deficiency 07/06/2020  . Cellulitis of right lower extremity 10/22/2019  . Cellulitis 10/21/2019  . Bipolar 1 disorder ([place]) 10/11/2019  . Tobacco dependence due to cigarettes 10/11/2019  . Fall as cause of accidental injury at home as place of occurrence 10/11/2019  . Traumatic hematoma of knee, right, initial encounter 10/11/2019  . Lower respiratory infection 12/22/2018  . Senile ecchymosis 09/11/2018  . CAP (community acquired pneumonia) 09/17/2017  . Near syncope 09/17/2017  . Tobacco abuse 02/12/2016  . Other fatigue 02/04/2016  . Respiratory failure with hypoxia ([place]) 01/09/2016   . Malnutrition of moderate degree 11/03/2015  . Pulmonary emphysema ([place])   . Essential hypertension 09/27/2015  . Rhabdomyolysis 08/16/2015  . Cough 06/07/2015  . Bronchiectasis with acute exacerbation ([place]) 03/07/2015  . Cigarette smoker 12/13/2014  . Stage 2 moderate COPD by GOLD classification ([place]) 10/11/2014  . Lumbar back pain with radiculopathy affecting right lower extremity 09/28/2014  . Hyponatremia 07/09/2014  . Hx of recurrent pneumonia 07/08/2014  . Venous insufficiency (chronic) (peripheral) 09/29/2012  . Lower extremity edema 03/09/2012  . Benign hypertensive heart disease without heart failure 05/09/2011  . Chest pain 05/09/2011  . Dyslipidemia 05/09/2011  . Mood disorder ([place]) 05/09/2011  . Osteoarthritis 05/09/2011    Past Surgical History:  Procedure Laterality Date  . CAROTID STENT    . DILATION AND CURETTAGE OF UTERUS    . TUBAL LIGATION  1986     OB History   No obstetric history on file.     Family History  Problem Relation Age of Onset  . Stroke Mother   . Heart disease Father   . Hyperlipidemia Father   . Hypertension Father   . Breast cancer Maternal Aunt   . Asthma Maternal Grandmother     Social History   Tobacco Use  . Smoking status: Current Every Day Smoker    Packs/day: 0.50    Years: 46.00    Pack years: 23.00    Types: Cigarettes  . Smokeless tobacco: Never Used  Vaping Use  . Vaping Use: Never used  Substance Use Topics  . Alcohol use: Yes    Alcohol/week: 0.0 standard drinks    Comment: 09/18/2017 "might have 1 drink/month; if that"  . Drug use: No    Home Medications Prior to Admission medications   Medication Sig Start Date End Date Taking? Authorizing Provider  doxycycline (VIBRAMYCIN) 100 MG capsule Take 1 capsule (100 mg total) by mouth 2 (two) times daily for 5 days. 04/10/21 04/15/21 Yes Walisiewicz, Cosimo Schertzer E, PA-C  Acetaminophen (TYLENOL ARTHRITIS PAIN PO) Take by mouth.    [provider]   albuterol (PROVENTIL) (2.5 MG/3ML) 0.083% nebulizer solution Take 2.5 mg by nebulization every 6 (six) hours as needed for wheezing or shortness of breath.    [provider]  albuterol (VENTOLIN HFA) 108 (90 Base) MCG/ACT inhaler TAKE 2 PUFFS BY MOUTH EVERY 6 HOURS AS NEEDED FOR WHEEZE OR SHORTNESS OF BREATH 03/25/21   Maximiano Coss, NP  alendronate (FOSAMAX) 70 MG tablet Take 1 tablet (70 mg total) by mouth every 7 (seven) days. Take with a full glass of water on an empty stomach. 01/13/20   Wendall Mola, NP  amLODipine (NORVASC) 2.5 MG tablet TAKE 1 TABLET BY MOUTH EVERY DAY 07/06/20   Jacelyn Pi, Lilia Argue, MD  aspirin EC 81 MG tablet Take 81 mg by mouth daily as needed.     [provider]  benzonatate Lavella Lemons)  100 MG capsule Take 1 capsule (100 mg total) by mouth 2 (two) times daily as needed for cough. 02/15/21   Maximiano Coss, NP  cetirizine (ZYRTEC) 10 MG tablet Take 1 tablet (10 mg total) by mouth daily. 02/15/21   Maximiano Coss, NP  fluticasone [facility]) 50 MCG/ACT nasal spray SPRAY 2 SPRAYS INTO EACH NOSTRIL EVERY DAY 11/09/20   Posey Boyer, MD  fluticasone (FLOVENT HFA) 110 MCG/ACT inhaler Inhale 1 puff into the lungs in the morning and at bedtime. 02/08/21   Maximiano Coss, NP  hydrOXYzine (VISTARIL) 25 MG capsule Take 1 capsule (25 mg total) by mouth 3 (three) times daily as needed. 01/21/21   Arfeen, Arlyce Harman, MD  levofloxacin (LEVAQUIN) 250 MG tablet Take 1 tablet (250 mg total) by mouth daily. 02/08/21   Maximiano Coss, NP  lithium carbonate (ESKALITH) 450 MG CR tablet Take 1 tablet (450 mg total) by mouth 2 (two) times daily. 01/21/21 01/21/22  Arfeen, Arlyce Harman, MD  nortriptyline (PAMELOR) 50 MG capsule Take 1 capsule (50 mg total) by mouth at bedtime. 01/21/21   Arfeen, Arlyce Harman, MD  traMADol (ULTRAM) 50 MG tablet Take 0.5-1 tablets (25-50 mg total) by mouth every 12 (twelve) hours as needed. 04/05/21   Maximiano Coss, NP  triamcinolone cream (KENALOG) 0.1 %  Apply 1 application topically 2 (two) times daily. 10/18/19   Wendall Mola, NP  venlafaxine (EFFEXOR) 75 MG tablet Take 1 tablet (75 mg total) by mouth daily. 01/21/21 01/21/22  Arfeen, Arlyce Harman, MD  Vitamin D, Ergocalciferol, (DRISDOL) 1.25 MG (50000 UNIT) CAPS capsule TAKE 1 CAPSULE (50,000 UNITS TOTAL) BY MOUTH EVERY 7 (SEVEN) DAYS 03/25/21   Maximiano Coss, NP    Allergies    Sulfa drugs cross reactors, Ceclor [cefaclor], Depakote er [divalproex sodium er], Escitalopram oxalate, Latex, Sertraline hcl, and Tape  Review of Systems   Review of Systems All other systems are reviewed and are negative for acute change except as noted in the HPI.  Physical Exam Updated Vital Signs BP 133/75 (BP Location: Left Arm)   Pulse 91   Temp 97.6 F (36.4 C)   Resp 15   SpO2 95%   Physical Exam Vitals and nursing note reviewed.  Constitutional:      General: She is not in acute distress.    Appearance: She is not ill-appearing.  HENT:     Head: Normocephalic and atraumatic.     Right Ear: Tympanic membrane and external ear normal.     Left Ear: Tympanic membrane and external ear normal.     Nose: Nose normal.     Mouth/Throat:     Mouth: Mucous membranes are moist.     Pharynx: Oropharynx is clear.  Eyes:     General: No scleral icterus.       Right eye: No discharge.        Left eye: No discharge.     Extraocular Movements: Extraocular movements intact.     Conjunctiva/sclera: Conjunctivae normal.     Pupils: Pupils are equal, round, and reactive to light.  Neck:     Vascular: No JVD.  Cardiovascular:     Rate and Rhythm: Normal rate and regular rhythm.     Pulses:          Radial pulses are 2+ on the right side and 2+ on the left side.       Dorsalis pedis pulses are detected w/ Doppler on the right side and detected w/ Doppler on the  left side.     Heart sounds: Normal heart sounds.  Pulmonary:     Comments: Lungs clear to auscultation in all fields. Symmetric chest rise.  No wheezing, rales, or rhonchi. Abdominal:     Comments: Abdomen is soft, non-distended, and non-tender in all quadrants. No rigidity, no guarding. No peritoneal signs.  Musculoskeletal:        General: Normal range of motion.     Cervical back: Normal range of motion.     Comments: Please see media below.  Negative Homans' sign bilaterally.  No palpable cords.  Skin:    General: Skin is warm and dry.     Capillary Refill: Capillary refill takes less than 2 seconds.     Comments: Left leg slightly warmer than right.  Neurological:     Mental Status: She is oriented to person, place, and time.     GCS: GCS eye subscore is 4. GCS verbal subscore is 5. GCS motor subscore is 6.     Comments: Fluent speech, no facial droop.  Psychiatric:        Behavior: Behavior normal.         ED Results / Procedures / Treatments   Labs (all labs ordered are listed, but only abnormal results are displayed) Labs Reviewed - No data to display  EKG None  Radiology VAS [name] LOWER EXTREMITY VENOUS (DVT) (ONLY [facility] & [facility])  Result Date: 04/10/2021  Lower Venous DVT Study Patient Name:  TESLA KEELER  Date of Exam:   04/10/2021 Medical Rec #: [id number]    Accession #:    [id number] Date of Birth: 1951-03-24    Patient Gender: F Patient Age:   69Y Exam [facility]  [facility] Procedure:      VAS [name] LOWER EXTREMITY VENOUS (DVT) Referring Phys: DAN FLOYD --------------------------------------------------------------------------------  Indications: Pain, Swelling, Erythema, and Recent cellulitis.  Comparison Study: Prior negative Left lower extremity venous duplex done                   12/06/2014 Performing Technologist: Sharion Dove RVS  Examination Guidelines: A complete evaluation includes B-mode imaging, spectral Doppler, color Doppler, and power Doppler as needed of all accessible portions of each vessel. Bilateral testing is considered an integral part of a complete examination. Limited examinations for  reoccurring indications may be performed as noted. The reflux portion of the exam is performed with the patient in reverse Trendelenburg.  +-----+---------------+---------+-----------+----------+--------------+ RIGHTCompressibilityPhasicitySpontaneityPropertiesThrombus Aging +-----+---------------+---------+-----------+----------+--------------+ CFV  Full           Yes      Yes                                 +-----+---------------+---------+-----------+----------+--------------+   +---------+---------------+---------+-----------+----------+--------------+ LEFT     CompressibilityPhasicitySpontaneityPropertiesThrombus Aging +---------+---------------+---------+-----------+----------+--------------+ CFV      Full           Yes      Yes                                 +---------+---------------+---------+-----------+----------+--------------+ SFJ      Full                                                        +---------+---------------+---------+-----------+----------+--------------+  FV Prox  Full                                                        +---------+---------------+---------+-----------+----------+--------------+ FV Mid   Full                                                        +---------+---------------+---------+-----------+----------+--------------+ FV DistalFull                                                        +---------+---------------+---------+-----------+----------+--------------+ PFV      Full                                                        +---------+---------------+---------+-----------+----------+--------------+ POP      Full                                                        +---------+---------------+---------+-----------+----------+--------------+ PTV      Full           Yes      Yes                                 +---------+---------------+---------+-----------+----------+--------------+  PERO     Full                                                        +---------+---------------+---------+-----------+----------+--------------+ Gastroc  Full                                                        +---------+---------------+---------+-----------+----------+--------------+ GSV      Full                                                        +---------+---------------+---------+-----------+----------+--------------+    Summary: RIGHT: - No evidence of common femoral vein obstruction.  LEFT: - There is no evidence of deep vein thrombosis in the lower extremity.  *See table(s) above for measurements and observations.    Preliminary     Procedures Procedures   Medications Ordered in ED Medications -  No data to display  ED Course  I have reviewed the triage vital signs and the nursing notes.  Pertinent labs & imaging results that were available during my care of the patient were reviewed by me and considered in my medical decision making (see chart for details).    MDM Rules/Calculators/A&P                          History provided by patient with additional history obtained from chart review.    Patient presenting with left leg swelling.  She is afebrile, hemodynamically stable.  DVT study was ordered in triage.  It is negative for DVT of left lower extremity.  On exam patient has slight warmth to the left lower leg.  She has brisk cap refill and pulses are easily obtained using Doppler.  Exam not consistent of ischemic leg.  She still has what looks to be some cellulitic infection on left lower extremity.  Patient also seen by ED attending Dr. Joya Gaskins who agrees with plan to treat cellulitis with doxycycline as she has tolerated that in the past.  She did just finish 10-day course x4 days ago however with her risk factors including being a smoker she would benefit from prolonged course.  Per PCP note referral has been sent for vascular to evaluate for PVD.  I  encourage patient to pursue that consult and she should follow-up with primary care doctor for wound check after completing antibiotics.  Strict return precautions were discussed.  Patient agreeable with plan of care.  Discharged home in stable condition.   Portions of this note were generated with Lobbyist. Dictation errors may occur despite best attempts at proofreading.  Final Clinical Impression(s) / ED Diagnoses Final diagnoses:  Cellulitis of left lower extremity    Rx / DC Orders ED Discharge Orders         Ordered    doxycycline (VIBRAMYCIN) 100 MG capsule  2 times daily        04/10/21 1404           Barrie Folk, PA-C 04/10/21 1411    Arnaldo Natal, MD 04/10/21 [phone]

## 2021-04-10 NOTE — Progress Notes (Signed)
VASCULAR LAB    Left lower extremity venous duplex has been performed.  See CV proc for preliminary results.  Called report to charge nurse, [place]  Jomar Denz, [place], RVT 04/10/2021, 12:24 PM

## 2021-04-11 ENCOUNTER — Ambulatory Visit: Payer: Medicare PPO | Admitting: Podiatry

## 2021-04-15 ENCOUNTER — Other Ambulatory Visit: Payer: Self-pay

## 2021-04-15 ENCOUNTER — Encounter (HOSPITAL_COMMUNITY): Payer: Self-pay | Admitting: Psychiatry

## 2021-04-15 ENCOUNTER — Telehealth (INDEPENDENT_AMBULATORY_CARE_PROVIDER_SITE_OTHER): Payer: Medicare PPO | Admitting: Psychiatry

## 2021-04-15 DIAGNOSIS — F419 Anxiety disorder, unspecified: Secondary | ICD-10-CM | POA: Diagnosis not present

## 2021-04-15 DIAGNOSIS — F319 Bipolar disorder, unspecified: Secondary | ICD-10-CM | POA: Diagnosis not present

## 2021-04-15 MED ORDER — NORTRIPTYLINE HCL 50 MG PO CAPS: 50.0000 mg | ORAL_CAPSULE | Freq: Every day | ORAL | 0 refills | Status: DC

## 2021-04-15 MED ORDER — VENLAFAXINE HCL 75 MG PO TABS: 75.0000 mg | ORAL_TABLET | Freq: Every day | ORAL | 0 refills | Status: DC

## 2021-04-15 MED ORDER — LITHIUM CARBONATE ER 300 MG PO TBCR: 300.0000 mg | EXTENDED_RELEASE_TABLET | Freq: Two times a day (BID) | ORAL | 0 refills | Status: DC

## 2021-04-15 NOTE — Progress Notes (Signed)
Virtual Visit via Telephone Note  I connected with Catherine Kelley on 04/15/21 at  2:00 PM EDT by telephone and verified that I am speaking with the correct person using two identifiers.  Location: Patient: Home Provider: Home Office   I discussed the limitations, risks, security and privacy concerns of performing an evaluation and management service by telephone and the availability of in person appointments. I also discussed with the patient that there may be a patient responsible charge related to this service. The patient expressed understanding and agreed to proceed.   History of Present Illness: Patient is evaluated by phone session.  She admitted not taking the lithium twice a day for past 4 weeks.  She also endorsed feeling more anxious, nervous and sometimes get distracted.  She told that she stopped the morning dose because she thought it is causing weight loss and too strong.  However despite stopping morning lithium she continued to feel lack of appetite and she lost another 5 pounds.  Now she noticed more nervous, anxious and sometimes worried about everything.  She is sleeping on and off.  She like to go back to lithium but want to take lower dose.  She is supposed to take lithium 450 mg twice a day but now taking 450 at bedtime.  She denies any hallucination, paranoia but admitted having ruminative thoughts.  She denies any crying spells or any suicidal thoughts.  She admitted getting easily anxious and nervous but denies any major panic attack.  She denies any mania, impulsive behavior.  She took few times hydroxyzine that helps her sleep.  She told having issues with her hearing and sometimes does not remember very well.  Her husband helps her and sometimes reminds to take the medication.  Past Psychiatric History: H/Odepression,anxiety and bipolar d/o.Had tried Paxil, Prozac and Zoloft that cause headaches.Abilify caused stiffnes.Took Xanax for many years however it was not  renewed by primary care physician when Dr.Kauroffice did not accept her insurance. Did not recall any history of suicidal attempt or psychiatric inpatient treatment.   Psychiatric Specialty Exam: Physical Exam  Review of Systems  Weight 108 lb (49 kg).There is no height or weight on file to calculate BMI.  General Appearance: NA  Eye Contact:  NA  Speech:  Slow  Volume:  Decreased  Mood:  Anxious  Affect:  NA  Thought Process:  Descriptions of Associations: Intact  Orientation:  Full (Time, Place, and Person)  Thought Content:  Rumination  Suicidal Thoughts:  No  Homicidal Thoughts:  No  Memory:  Immediate;   Fair Recent;   Fair Remote;   Fair  Judgement:  Fair  Insight:  Shallow  Psychomotor Activity:  NA  Concentration:  Concentration: Fair and Attention Span: Fair  Recall:  [employer] of Knowledge:  Fair  Language:  Fair  Akathisia:  No  Handed:  Right  AIMS (if indicated):     Assets:  Communication Skills Desire for Improvement Housing Social Support  ADL's:  Intact  Cognition:  WNL  Sleep:   fair      Assessment and Plan: Bipolar disorder type I.  Anxiety.  Patient again not compliant with medication with the lithium.  Reinforced medication compliance.  She did not provide the details why she cut down the lithium other than she feels it is strong and causing weight loss.  However despite stopping the morning dose of lithium she continues to lose weight.  I encouraged she should check with her PCP  to address her weight loss.  She reported had a good appetite.  Now she wants to go back to lithium twice a day but like to have a reduced dose to 300 mg twice a day.  She also like to current dose of nortriptyline and venlafaxine which is helping her mood.  She has enough hydroxyzine 25 mg which she takes only as needed when she cannot sleep.  We will order lithium level which she will do after 2 weeks of taking the medication.  Recommended to call us back if she is  any question or any concern.  I also recommended that if she has any issues with the medication then she should call us rather than stopping the medication.  She acknowledged.  Follow-up in 3 months.  Order lithium level.  Follow Up Instructions:    I discussed the assessment and treatment plan with the patient. The patient was provided an opportunity to ask questions and all were answered. The patient agreed with the plan and demonstrated an understanding of the instructions.   The patient was advised to call back or seek an in-person evaluation if the symptoms worsen or if the condition fails to improve as anticipated.  I provided 17 minutes of non-face-to-face time during this encounter.   Kathlee Nations, MD

## 2021-04-16 ENCOUNTER — Other Ambulatory Visit (HOSPITAL_COMMUNITY): Payer: Self-pay

## 2021-04-16 DIAGNOSIS — Z79899 Other long term (current) drug therapy: Secondary | ICD-10-CM

## 2021-04-18 ENCOUNTER — Other Ambulatory Visit (HOSPITAL_COMMUNITY): Payer: Self-pay | Admitting: Psychiatry

## 2021-04-18 DIAGNOSIS — F319 Bipolar disorder, unspecified: Secondary | ICD-10-CM

## 2021-04-19 ENCOUNTER — Other Ambulatory Visit: Payer: Self-pay | Admitting: Registered Nurse

## 2021-04-23 DIAGNOSIS — Z79899 Other long term (current) drug therapy: Secondary | ICD-10-CM | POA: Diagnosis not present

## 2021-04-24 ENCOUNTER — Telehealth (HOSPITAL_COMMUNITY): Payer: Self-pay | Admitting: *Deleted

## 2021-04-24 LAB — LITHIUM LEVEL: Lithium Lvl: 0.5 mmol/L (ref 0.5–1.2)

## 2021-04-24 NOTE — Telephone Encounter (Signed)
Lithium level resulted on 04/24/21 received from [facility].  Level currently WNL at 0.5 mmol/L.

## 2021-04-24 NOTE — Telephone Encounter (Signed)
Thanks.  Level is therapeutic.

## 2021-04-25 ENCOUNTER — Telehealth: Payer: Self-pay | Admitting: Registered Nurse

## 2021-04-25 NOTE — Telephone Encounter (Signed)
Left message for patient to schedule Annual Wellness Visit.  Please schedule with Nurse Health Advisor Martha Stanley, RN at [place]  

## 2021-05-06 ENCOUNTER — Ambulatory Visit (INDEPENDENT_AMBULATORY_CARE_PROVIDER_SITE_OTHER): Payer: Medicare PPO | Admitting: *Deleted

## 2021-05-06 DIAGNOSIS — Z0000 Encounter for general adult medical examination without abnormal findings: Secondary | ICD-10-CM

## 2021-05-06 NOTE — Progress Notes (Signed)
Subjective:   Catherine Kelley is a 70 y.o. female who presents for Medicare Annual (Subsequent) preventive examination.  Virtual Visit via Telephone Note  I connected with  Catherine Kelley on 05/06/21 at  2:15 PM EDT by telephone and verified that I am speaking with the correct person using two identifiers.  Location: Patient: home  Provider: home Persons participating in the virtual visit: [place]   I discussed the limitations, risks, security and privacy concerns of performing an evaluation and management service by telephone and the availability of in person appointments. The patient expressed understanding and agreed to proceed.  Interactive audio and video telecommunications were attempted between this nurse and patient, however failed, due to patient having technical difficulties OR patient did not have access to video capability.  We continued and completed visit with audio only.  Some vital signs may be absent or patient reported.   Leroy Kennedy, LPN   Review of Systems    N/A       Objective:    There were no vitals filed for this visit. There is no height or weight on file to calculate BMI.  Advanced Directives 04/10/2021 11/08/2020 10/22/2019 07/27/2019 09/18/2017 09/17/2017 02/12/2016  Does Patient Have a Medical Advance Directive? No No No No No No No  Would patient like information on creating a medical advance directive? No - Patient declined No - Patient declined No - Patient declined No - Patient declined No - Patient declined - No - patient declined information  Pre-existing out of facility DNR order (yellow form or pink MOST form) - - - - - - -  Some encounter information is confidential and restricted. Go to Review Flowsheets activity to see all data.    Current Medications (verified) Outpatient Encounter Medications as of 05/06/2021  Medication Sig  . albuterol (PROVENTIL) (2.5 MG/3ML) 0.083% nebulizer solution Take 2.5 mg by nebulization every  6 (six) hours as needed for wheezing or shortness of breath.  [name] albuterol (VENTOLIN HFA) 108 (90 Base) MCG/ACT inhaler TAKE 2 PUFFS BY MOUTH EVERY 6 HOURS AS NEEDED FOR WHEEZE OR SHORTNESS OF BREATH  . alendronate (FOSAMAX) 70 MG tablet Take 1 tablet (70 mg total) by mouth every 7 (seven) days. Take with a full glass of water on an empty stomach.  [name] amLODipine (NORVASC) 2.5 MG tablet TAKE 1 TABLET BY MOUTH EVERY DAY  . aspirin EC 81 MG tablet Take 81 mg by mouth daily as needed.   . cetirizine (ZYRTEC) 10 MG tablet Take 1 tablet (10 mg total) by mouth daily.  . fluticasone (FLONASE) 50 MCG/ACT nasal spray SPRAY 2 SPRAYS INTO EACH NOSTRIL EVERY DAY  . fluticasone (FLOVENT HFA) 110 MCG/ACT inhaler Inhale 1 puff into the lungs in the morning and at bedtime.  . hydrOXYzine (VISTARIL) 25 MG capsule Take 1 capsule (25 mg total) by mouth 3 (three) times daily as needed.  . lithium carbonate (LITHOBID) 300 MG CR tablet Take 1 tablet (300 mg total) by mouth 2 (two) times daily.  . nortriptyline (PAMELOR) 50 MG capsule Take 1 capsule (50 mg total) by mouth at bedtime.  . triamcinolone cream (KENALOG) 0.1 % Apply 1 application topically 2 (two) times daily.  [name] venlafaxine (EFFEXOR) 75 MG tablet Take 1 tablet (75 mg total) by mouth daily.  . Vitamin D, Ergocalciferol, (DRISDOL) 1.25 MG (50000 UNIT) CAPS capsule TAKE 1 CAPSULE (50,000 UNITS TOTAL) BY MOUTH EVERY 7 (SEVEN) DAYS  . Acetaminophen (TYLENOL ARTHRITIS PAIN PO) Take  by mouth. (Patient not taking: Reported on 05/06/2021)  . benzonatate (TESSALON) 100 MG capsule Take 1 capsule (100 mg total) by mouth 2 (two) times daily as needed for cough.  [name] levofloxacin (LEVAQUIN) 250 MG tablet Take 1 tablet (250 mg total) by mouth daily.  . traMADol (ULTRAM) 50 MG tablet Take 0.5-1 tablets (25-50 mg total) by mouth every 12 (twelve) hours as needed. (Patient not taking: Reported on 05/06/2021)   No facility-administered encounter medications on file as of 05/06/2021.     Allergies (verified) Sulfa drugs cross reactors, Ceclor [cefaclor], Depakote er [divalproex sodium er], Escitalopram oxalate, Latex, Sertraline hcl, and Tape   History: Past Medical History:  Diagnosis Date  . Anxiety   . Arthritis    "jooints" (09/18/2017)  . Asthma   . Bipolar disorder ([place])   . Bipolar I disorder ([place]) 10/11/2019  . CAP (community acquired pneumonia) 09/18/2017  . Cat allergies   . Chronic bronchitis ([place])    "get it alot; maybe not q yr" (07/06/2014)  . Chronic lower back pain    "spurs and pinched nerves" (09/18/2017)  . COPD (chronic obstructive pulmonary disease) ([place])   . DDD (degenerative disc disease), lumbar   . Depression    psychiatrist Dr. Toy Care  . Diastolic dysfunction   . Emphysematous COPD ([place])    changes in right base along with patchy areas on last x-ray  . Fall as cause of accidental injury at home as place of occurrence 10/11/2019  . Fall involving wheelchair as cause of accidental injury 10/11/2019  . Falls frequently    "several in 2017; fell at dr's office yesterday" (09/18/2017)  . GERD (gastroesophageal reflux disease)   . HCAP (healthcare-associated pneumonia) 02/12/2016  . Heart murmur   . History of cardiovascular stress test 08/15/2004   EF of 70% -- Normal stress cardiolite.  There is no evidence of ischemia and there is normal LV function. -- [name]. MD  . History of echocardiogram 12/24/2006   Est. EF of 99-24% NORMAL LV SYSTOLIC FUNCTION WITH IMPAIRED RELAXATION -- MILD AORTIC SCLEROSIS -- NORMAL PALONARY ARTERY PRESSURE -- NO OLD ECHOS FOR COMPARISON -- [name], MD  . Hypertension    essential hypertension  . Migraine    "when I was having my periods; none since then" (09/18/2017)  . Necrotic pneumonia ([place])    recurrent/notes 02/12/2016  . Pneumonia    "several times since May 2015" (07/06/2014)  . Pollen allergies   . Tobacco abuse    smoking about 1/2 pk of cigarettes a day  . Tobacco dependence  due to cigarettes 10/11/2019  . Traumatic hematoma of knee, right, initial encounter 10/11/2019   Past Surgical History:  Procedure Laterality Date  . CAROTID STENT    . DILATION AND CURETTAGE OF UTERUS    . TUBAL LIGATION  1986   Family History  Problem Relation Age of Onset  . Stroke Mother   . Heart disease Father   . Hyperlipidemia Father   . Hypertension Father   . Breast cancer Maternal Aunt   . Asthma Maternal Grandmother    Social History   Socioeconomic History  . Marital status: Married    Spouse name: richard  . Number of children: 2  . Years of education: Not on file  . Highest education level: Associate degree: occupational, Hotel manager, or vocational program  Occupational History  . Occupation: Chemical engineer: [employer]    Comment: [employer]  Tobacco Use  . Smoking  status: Current Every Day Smoker    Packs/day: 0.50    Years: 46.00    Pack years: 23.00    Types: Cigarettes  . Smokeless tobacco: Never Used  Vaping Use  . Vaping Use: Never used  Substance and Sexual Activity  . Alcohol use: Yes    Alcohol/week: 0.0 standard drinks    Comment: 09/18/2017 "might have 1 drink/month; if that"  . Drug use: No  . Sexual activity: Not Currently  Other Topics Concern  . Not on file  Social History Narrative   Patient currently lives with her husband.    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 No bird or hot tub exposure. No mold exposure. No recent travel.   Social Determinants of Health   Financial Resource Strain: Not on file  Food Insecurity: Not on file  Transportation Needs: Not on file  Physical Activity: Not on file  Stress: Not on file  Social Connections: Not on file    Tobacco Counseling Ready to quit: Not Answered Counseling given: Not Answered   Clinical Intake:  Pre-visit preparation completed: Yes  Pain : No/denies pain      Nutritional Risks: None  How often do you need to have someone help you when you read instructions, pamphlets, or other written materials from your doctor or pharmacy?: 1 - Never  Diabetic?NO  Interpreter Needed?: No  Information entered by :: Leroy Kennedy LPN   Activities of Daily Living No flowsheet data found.  Patient Care Team: Maximiano Coss, NP as PCP - General (Adult Health Nurse Practitioner) Cooper Render, NP as Nurse Practitioner (Nurse Practitioner) Nahser, Wonda Cheng, MD as Consulting Physician (Cardiology)  Indicate any recent Medical Services you may have received from other than [facility] providers in the past year (date may be approximate).     Assessment:   This is a routine wellness examination for [place].  Hearing/Vision screen No exam data present  Dietary issues and exercise activities discussed:    Goals Addressed   None    Depression Screen PHQ 2/9 Scores 04/05/2021 03/27/2021 02/08/2021 08/17/2020 07/06/2020 04/13/2020 10/18/2019  PHQ - 2 Score 0 0 0 0 0 0 0  PHQ- 9 Score - - - - - - -    Fall Risk Fall Risk  04/05/2021 03/27/2021 02/08/2021 08/17/2020 07/06/2020  Falls in the past year? 0 1 1 0 0  Comment - - - - -  Number falls in past yr: 0 1 1 0 0  Injury with Fall? 0 1 1 0 0  Comment - - - - -  Risk for fall due to : - - - - -  Follow up Falls evaluation completed Falls evaluation completed Falls evaluation completed Falls evaluation completed Falls evaluation completed    FALL RISK PREVENTION PERTAINING TO THE HOME:  Any stairs in or around the home? No  If so, are there any without handrails? Yes  Home free of loose throw rugs in walkways, pet beds, electrical cords, etc? Yes  Adequate lighting in your home to reduce risk of falls? Yes   ASSISTIVE DEVICES UTILIZED TO PREVENT FALLS:  Life alert? No  Use of a cane, walker or w/c? No Grab bars in the bathroom? Yes  Shower chair or bench in shower? Yes  Elevated toilet seat or a handicapped  toilet? No   TIMED UP AND GO:  Was the test performed? No .      Cognitive Function:  Normal cognitive status assessed by direct observation by this Nurse Health Advisor. No abnormalities found.      6CIT Screen 07/27/2019  What Year? 0 points  What month? 0 points  What time? 0 points  Count back from 20 0 points  Months in reverse 0 points  Repeat phrase 0 points  Total Score 0    Immunizations Immunization History  Administered Date(s) Administered  . Fluad Quad(high Dose 65+) 10/11/2019, 09/21/2020  . Influenza Split 09/29/2012, 08/31/2013  . Influenza, High Dose Seasonal PF 08/30/2017, 09/11/2018  . Influenza,inj,Quad PF,6+ Mos 09/06/2014, 09/28/2014, 10/10/2015, 09/22/2016  . Influenza-Unspecified 10/01/2017  . PFIZER(Purple Top)SARS-COV-2 Vaccination 04/08/2020  . Pneumococcal Conjugate-13 12/13/2014  . Pneumococcal Polysaccharide-23 08/31/2013, 11/02/2015  . Tdap 07/08/2015    TDAP status: Up to date  Flu Vaccine status: Up to date  Pneumococcal vaccine status: Up to date  Covid-19 vaccine status: Information provided on how to obtain vaccines.   Qualifies for Shingles Vaccine? Yes   Zostavax completed No   Shingrix Completed?: No.    Education has been provided regarding the importance of this vaccine. Patient has been advised to call insurance company to determine out of pocket expense if they have not yet received this vaccine. Advised may also receive vaccine at local pharmacy or Health Dept. Verbalized acceptance and understanding.  Screening Tests Health Maintenance  Topic Date Due  . Pneumococcal Vaccine 16-71 Years old (1 of 4 - PCV13) Never done  . Zoster Vaccines- Shingrix (1 of 2) Never done  . COVID-19 Vaccine (2 - Pfizer 3-dose series) 04/29/2020  . INFLUENZA VACCINE  07/01/2021  . MAMMOGRAM  01/05/2022  . COLONOSCOPY (Pts 45-64yrs Insurance coverage will need to be confirmed)  12/02/2023  . TETANUS/TDAP  07/07/2025  . DEXA SCAN   Completed  . Hepatitis C Screening  Completed  . PNA vac Low Risk Adult  Completed  . HPV VACCINES  Aged Out    Health Maintenance  Health Maintenance Due  Topic Date Due  . Pneumococcal Vaccine 29-47 Years old (1 of 4 - PCV13) Never done  . Zoster Vaccines- Shingrix (1 of 2) Never done  . COVID-19 Vaccine (2 - Pfizer 3-dose series) 04/29/2020    Colorectal cancer screening: Type of screening: Colonoscopy. Completed 12-02-2023. Repeat every 5 years  Mammogram status: Completed 01-06-2020. Repeat every year  Bone Density status: Completed 01-06-2020. Results reflect: Bone density results: NORMAL. Repeat every 2 years.  Lung Cancer Screening: (Low Dose CT Chest recommended if Age 10-80 years, 30 pack-year currently smoking OR have quit w/in 15years.) does qualify.   Lung Cancer Screening Referral: no  Additional Screening:  Hepatitis C Screening: does qualify; Completed 07-29-2019  Vision Screening: Recommended annual ophthalmology exams for early detection of glaucoma and other disorders of the eye. Is the patient up to date with their annual eye exam?  Yes  Who is the provider or what is the name of the office in which the patient attends annual eye exams? Dr Katy Fitch  If pt is not established with a provider, would they like to be referred to a provider to establish care? Yes .   Dental Screening: Recommended annual dental exams for proper oral hygiene  Community Resource Referral / Chronic Care Management: CRR required this visit?  No   CCM required this visit?  No      Plan:     I have personally reviewed and noted  the following in the patient's chart:   . Medical and social history . Use of alcohol, tobacco or illicit drugs  . Current medications and supplements including opioid prescriptions.  . Functional ability and status . Nutritional status . Physical activity . Advanced directives . List of other physicians . Hospitalizations, surgeries, and ER visits in  previous 12 months . Vitals . Screenings to include cognitive, depression, and falls . Referrals and appointments  In addition, I have reviewed and discussed with patient certain preventive protocols, quality metrics, and best practice recommendations. A written personalized care plan for preventive services as well as general preventive health recommendations were provided to patient.     Leroy Kennedy, LPN   05/07/1244   Nurse Notes: na

## 2021-05-06 NOTE — Patient Instructions (Addendum)
[name] , Thank you for taking time to come for your Medicare Wellness Visit. I appreciate your ongoing commitment to your health goals. Please review the following plan we discussed and let me know if I can assist you in the future.   Screening recommendations/referrals: Colonoscopy: completed 12-01-2013        phthalmology/optometry visit for glaucoma screening and checkup Recommended yearly dental visit for hygiene and checkup  Vaccinations: Influenza vaccine: up to date   Pneumococcal vaccine: up to date Tdap vaccine: completed 07-08-2015 Shingles vaccine: Education provided DUE  Advanced directives:  Education provided  Conditions/risks identified: None  Next appointment:  09-26-2021 @ 10:30 Catherine Coss NP   Preventive Care 71 Years and Older, Female Preventive care refers to lifestyle choices and visits with your health care provider that can promote health and wellness. What does preventive care include?  A yearly physical exam. This is also called an annual well check.  Dental exams once or twice a year.  Routine eye exams. Ask your health care provider how often you should have your eyes checked.  Personal lifestyle choices, including:  Daily care of your teeth and gums.  Regular physical activity.  Eating a healthy diet.  Avoiding tobacco and drug use.  Limiting alcohol use.  Practicing safe sex.  Taking low-dose aspirin every day.  Taking vitamin and mineral supplements as recommended by your health care provider. What happens during an annual well check? The services and screenings done by your health care provider during your annual well check will depend on your age, overall health, lifestyle risk factors, and family history of disease. Counseling  Your health care provider may ask you questions about your:  Alcohol use.  Tobacco use.  Drug use.  Emotional well-being.  Home and relationship well-being.  Sexual activity.  Eating  habits.  History of falls.  Memory and ability to understand (cognition).  Work and work Statistician.  Reproductive health. Screening  You may have the following tests or measurements:  Height, weight, and BMI.  Blood pressure.  Lipid and cholesterol levels. These may be checked every 5 years, or more frequently if you are over 33 years old.  Skin check.  Lung cancer screening. You may have this screening every year starting at age 38 if you have a 30-pack-year history of smoking and currently smoke or have quit within the past 15 years.  Fecal occult blood test (FOBT) of the stool. You may have this test every year starting at age 30.  Flexible sigmoidoscopy or colonoscopy. You may have a sigmoidoscopy every 5 years or a colonoscopy every 10 years starting at age 31.  Hepatitis C blood test.  Hepatitis B blood test.  Sexually transmitted disease (STD) testing.  Diabetes screening. This is done by checking your blood sugar (glucose) after you have not eaten for a while (fasting). You may have this done every 1-3 years.  Bone density scan. This is done to screen for osteoporosis. You may have this done starting at age 17.  Mammogram. This may be done every 1-2 years. Talk to your health care provider about how often you should have regular mammograms. Talk with your health care provider about your test results, treatment options, and if necessary, the need for more tests. Vaccines  Your health care provider may recommend certain vaccines, such as:  Influenza vaccine. This is recommended every year.  Tetanus, diphtheria, and acellular pertussis (Tdap, Td) vaccine. You may need a Td booster every 10 years.  Zoster vaccine. You may need this after age 31.  Pneumococcal 13-valent conjugate (PCV13) vaccine. One dose is recommended after age 56.  Pneumococcal polysaccharide (PPSV23) vaccine. One dose is recommended after age 78. Talk to your health care provider about which  screenings and vaccines you need and how often you need them. This information is not intended to replace advice given to you by your health care provider. Make sure you discuss any questions you have with your health care provider. Document Released: 12/14/2015 Document Revised: 08/06/2016 Document Reviewed: 09/18/2015 Elsevier Interactive Patient Education  2017 [place] Prevention in the Home Falls can cause injuries. They can happen to people of all ages. There are many things you can do to make your home safe and to help prevent falls. What can I do on the outside of my home?  Regularly fix the edges of walkways and driveways and fix any cracks.  Remove anything that might make you trip as you walk through a door, such as a raised step or threshold.  Trim any bushes or trees on the path to your home.  Use bright outdoor lighting.  Clear any walking paths of anything that might make someone trip, such as rocks or tools.  Regularly check to see if handrails are loose or broken. Make sure that both sides of any steps have handrails.  Any raised decks and porches should have guardrails on the edges.  Have any leaves, snow, or ice cleared regularly.  Use sand or salt on walking paths during winter.  Clean up any spills in your garage right away. This includes oil or grease spills. What can I do in the bathroom?  Use night lights.  Install grab bars by the toilet and in the tub and shower. Do not use towel bars as grab bars.  Use non-skid mats or decals in the tub or shower.  If you need to sit down in the shower, use a plastic, non-slip stool.  Keep the floor dry. Clean up any water that spills on the floor as soon as it happens.  Remove soap buildup in the tub or shower regularly.  Attach bath mats securely with double-sided non-slip rug tape.  Do not have throw rugs and other things on the floor that can make you trip. What can I do in the bedroom?  Use  night lights.  Make sure that you have a light by your bed that is easy to reach.  Do not use any sheets or blankets that are too big for your bed. They should not hang down onto the floor.  Have a firm chair that has side arms. You can use this for support while you get dressed.  Do not have throw rugs and other things on the floor that can make you trip. What can I do in the kitchen?  Clean up any spills right away.  Avoid walking on wet floors.  Keep items that you use a lot in easy-to-reach places.  If you need to reach something above you, use a strong step stool that has a grab bar.  Keep electrical cords out of the way.  Do not use floor polish or wax that makes floors slippery. If you must use wax, use non-skid floor wax.  Do not have throw rugs and other things on the floor that can make you trip. What can I do with my stairs?  Do not leave any items on the stairs.  Make sure that there are  handrails on both sides of the stairs and use them. Fix handrails that are broken or loose. Make sure that handrails are as long as the stairways.  Check any carpeting to make sure that it is firmly attached to the stairs. Fix any carpet that is loose or worn.  Avoid having throw rugs at the top or bottom of the stairs. If you do have throw rugs, attach them to the floor with carpet tape.  Make sure that you have a light switch at the top of the stairs and the bottom of the stairs. If you do not have them, ask someone to add them for you. What else can I do to help prevent falls?  Wear shoes that:  Do not have high heels.  Have rubber bottoms.  Are comfortable and fit you well.  Are closed at the toe. Do not wear sandals.  If you use a stepladder:  Make sure that it is fully opened. Do not climb a closed stepladder.  Make sure that both sides of the stepladder are locked into place.  Ask someone to hold it for you, if possible.  Clearly mark and make sure that you  can see:  Any grab bars or handrails.  First and last steps.  Where the edge of each step is.  Use tools that help you move around (mobility aids) if they are needed. These include:  Canes.  Walkers.  Scooters.  Crutches.  Turn on the lights when you go into a dark area. Replace any light bulbs as soon as they burn out.  Set up your furniture so you have a clear path. Avoid moving your furniture around.  If any of your floors are uneven, fix them.  If there are any pets around you, be aware of where they are.  Review your medicines with your doctor. Some medicines can make you feel dizzy. This can increase your chance of falling. Ask your doctor what other things that you can do to help prevent falls. This information is not intended to replace advice given to you by your health care provider. Make sure you discuss any questions you have with your health care provider. Document Released: 09/13/2009 Document Revised: 04/24/2016 Document Reviewed: 12/22/2014 Elsevier Interactive Patient Education  2017 [employer].

## 2021-05-14 ENCOUNTER — Other Ambulatory Visit: Payer: Self-pay | Admitting: Registered Nurse

## 2021-05-15 ENCOUNTER — Other Ambulatory Visit: Payer: Self-pay | Admitting: Registered Nurse

## 2021-05-15 ENCOUNTER — Ambulatory Visit: Payer: Medicare PPO | Admitting: Podiatry

## 2021-06-06 ENCOUNTER — Other Ambulatory Visit: Payer: Self-pay | Admitting: Registered Nurse

## 2021-06-17 ENCOUNTER — Other Ambulatory Visit (HOSPITAL_COMMUNITY): Payer: Self-pay | Admitting: *Deleted

## 2021-06-17 DIAGNOSIS — F319 Bipolar disorder, unspecified: Secondary | ICD-10-CM

## 2021-06-17 MED ORDER — HYDROXYZINE PAMOATE 25 MG PO CAPS: 25.0000 mg | ORAL_CAPSULE | Freq: Every evening | ORAL | 0 refills | Status: DC | PRN

## 2021-07-11 ENCOUNTER — Other Ambulatory Visit: Payer: Self-pay | Admitting: Registered Nurse

## 2021-07-17 ENCOUNTER — Telehealth (INDEPENDENT_AMBULATORY_CARE_PROVIDER_SITE_OTHER): Payer: Medicare PPO | Admitting: Psychiatry

## 2021-07-17 ENCOUNTER — Encounter (HOSPITAL_COMMUNITY): Payer: Self-pay | Admitting: Psychiatry

## 2021-07-17 ENCOUNTER — Other Ambulatory Visit: Payer: Self-pay

## 2021-07-17 DIAGNOSIS — F419 Anxiety disorder, unspecified: Secondary | ICD-10-CM

## 2021-07-17 DIAGNOSIS — F319 Bipolar disorder, unspecified: Secondary | ICD-10-CM | POA: Diagnosis not present

## 2021-07-17 MED ORDER — VENLAFAXINE HCL 75 MG PO TABS: 75.0000 mg | ORAL_TABLET | Freq: Every day | ORAL | 0 refills | Status: DC

## 2021-07-17 MED ORDER — HYDROXYZINE PAMOATE 25 MG PO CAPS: 25.0000 mg | ORAL_CAPSULE | Freq: Every evening | ORAL | 0 refills | Status: DC | PRN

## 2021-07-17 MED ORDER — NORTRIPTYLINE HCL 50 MG PO CAPS: 50.0000 mg | ORAL_CAPSULE | Freq: Every day | ORAL | 0 refills | Status: DC

## 2021-07-17 MED ORDER — LITHIUM CARBONATE ER 300 MG PO TBCR: 300.0000 mg | EXTENDED_RELEASE_TABLET | Freq: Two times a day (BID) | ORAL | 0 refills | Status: DC

## 2021-07-17 NOTE — Progress Notes (Signed)
Virtual Visit via Telephone Note  I connected with Catherine Kelley on 07/17/21 at  2:00 PM EDT by telephone and verified that I am speaking with the correct person using two identifiers.  Location: Patient: home Provider: home office   I discussed the limitations, risks, security and privacy concerns of performing an evaluation and management service by telephone and the availability of in person appointments. I also discussed with the patient that there may be a patient responsible charge related to this service. The patient expressed understanding and agreed to proceed.     History of Present Illness: Patient is evaluated by phone session.  She is now taking lithium twice a day and feeling much better.  She denies any paranoia, anxiety, suicidal thoughts.  She is more calm and able to sleep well with the help of hydroxyzine.  She takes hydroxyzine every night as she tried to stop some nights but could not sleep well.  Her appetite is okay.  She reported her weight is stable but she does not have weighing scale but not able to check her weight.  She has not seen PCP in a while but like to schedule appointment with a provider.  She has no tremors, shakes or any EPS.  She denies any mania, psychosis or any hallucinations.  She lives with her husband who is very supportive.  Patient does not drive as she had a history of dizziness and she does not want to put herself on the road and taking precautions.  She had a lithium level which is 0.5.  She used to take lithium 450 mg twice a day but normal comfortable with 300 twice a day.  She realized that she need to take the lithium otherwise she will get sick and her illness come back.  Past Psychiatric History:  H/O depression, anxiety and bipolar d/o. Had tried Paxil, Prozac and Zoloft that cause headaches. Abilify caused stiffnes.  Took Xanax for many years however it was not renewed by primary care physician when Dr. Toy Care office did not accept her insurance.   Did not recall any history of suicidal attempt or psychiatric inpatient treatment.    Recent Results (from the past 2160 hour(s))  Lithium level     Status: None   Collection Time: 04/23/21  1:31 PM  Result Value Ref Range   Lithium Lvl 0.5 0.5 - 1.2 mmol/L    Comment: Plasma concentration of 0.5 - 0.8 mmol/L are advised for long-term use; concentrations of up to 1.2 mmol/L may be necessary during acute treatment.                                  Detection Limit = 0.1                           <0.1 indicates None Detected     Psychiatric Specialty Exam: Physical Exam  Review of Systems  There were no vitals taken for this visit.There is no height or weight on file to calculate BMI.  General Appearance: NA  Eye Contact:  NA  Speech:  Slow  Volume:  Decreased  Mood:  Anxious  Affect:  NA  Thought Process:  Descriptions of Associations: Intact  Orientation:  Full (Time, Place, and Person)  Thought Content:  Rumination  Suicidal Thoughts:  No  Homicidal Thoughts:  No  Memory:  Immediate;   Fair Recent;  Fair Remote;   Fair  Judgement:  Intact  Insight:  Fair  Psychomotor Activity:  NA  Concentration:  Concentration: Fair and Attention Span: Fair  Recall:  [employer] of Knowledge:  Fair  Language:  Good  Akathisia:  No  Handed:  Right  AIMS (if indicated):     Assets:  Communication Skills Desire for Improvement Housing Resilience Social Support  ADL's:  Intact  Cognition:  WNL  Sleep:   good with hydroxyzine      Assessment and Plan: Bipolar disorder type I.  Anxiety.  Patient doing better since compliant with lithium 300 mg twice a day.  We discussed lithium level but patient comfortable with the current dose and she feels it is helping her mood.  However she like to take the hydroxyzine 25 mg every night since it is helping her sleep.  Discussed medication side effects and benefits.  Continue hydroxyzine 25 mg at bedtime, lithium 300 mg twice a day, venlafaxine  75 mg daily, nortriptyline 50 mg at bedtime.  Recommended to call us back if she has any question or any concern.  Follow-up in 3 months.  Follow Up Instructions:    I discussed the assessment and treatment plan with the patient. The patient was provided an opportunity to ask questions and all were answered. The patient agreed with the plan and demonstrated an understanding of the instructions.   The patient was advised to call back or seek an in-person evaluation if the symptoms worsen or if the condition fails to improve as anticipated.  I provided 19 minutes of non-face-to-face time during this encounter.   Kathlee Nations, MD

## 2021-08-08 ENCOUNTER — Other Ambulatory Visit: Payer: Self-pay | Admitting: Registered Nurse

## 2021-09-12 ENCOUNTER — Other Ambulatory Visit: Payer: Self-pay | Admitting: Registered Nurse

## 2021-09-26 ENCOUNTER — Ambulatory Visit: Payer: Medicare PPO | Admitting: Registered Nurse

## 2021-09-26 ENCOUNTER — Encounter: Payer: Self-pay | Admitting: Registered Nurse

## 2021-09-26 ENCOUNTER — Other Ambulatory Visit: Payer: Self-pay

## 2021-09-26 VITALS — BP 153/88 | HR 88 | Temp 97.8°F | Resp 18 | Ht 63.0 in | Wt 109.2 lb

## 2021-09-26 DIAGNOSIS — L03116 Cellulitis of left lower limb: Secondary | ICD-10-CM | POA: Diagnosis not present

## 2021-09-26 DIAGNOSIS — L0390 Cellulitis, unspecified: Secondary | ICD-10-CM | POA: Diagnosis not present

## 2021-09-26 DIAGNOSIS — G8929 Other chronic pain: Secondary | ICD-10-CM

## 2021-09-26 DIAGNOSIS — J449 Chronic obstructive pulmonary disease, unspecified: Secondary | ICD-10-CM

## 2021-09-26 DIAGNOSIS — L03115 Cellulitis of right lower limb: Secondary | ICD-10-CM | POA: Diagnosis not present

## 2021-09-26 DIAGNOSIS — I700 Atherosclerosis of aorta: Secondary | ICD-10-CM

## 2021-09-26 DIAGNOSIS — R7303 Prediabetes: Secondary | ICD-10-CM

## 2021-09-26 DIAGNOSIS — J441 Chronic obstructive pulmonary disease with (acute) exacerbation: Secondary | ICD-10-CM | POA: Diagnosis not present

## 2021-09-26 LAB — COMPREHENSIVE METABOLIC PANEL
ALT: 15 U/L (ref 0–35)
AST: 23 U/L (ref 0–37)
Albumin: 3.8 g/dL (ref 3.5–5.2)
Alkaline Phosphatase: 123 U/L — ABNORMAL HIGH (ref 39–117)
BUN: 8 mg/dL (ref 6–23)
CO2: 26 mEq/L (ref 19–32)
Calcium: 9.1 mg/dL (ref 8.4–10.5)
Chloride: 100 mEq/L (ref 96–112)
Creatinine, Ser: 0.5 mg/dL (ref 0.40–1.20)
GFR: 94.56 mL/min (ref >60.00)
Glucose, Bld: 95 mg/dL (ref 70–99)
Potassium: 4.1 mEq/L (ref 3.5–5.1)
Sodium: 133 mEq/L — ABNORMAL LOW (ref 135–145)
Total Bilirubin: 0.4 mg/dL (ref 0.2–1.2)
Total Protein: 7.2 g/dL (ref 6.0–8.3)

## 2021-09-26 LAB — LIPID PANEL
Cholesterol: 161 mg/dL (ref 0–200)
HDL: 63.4 mg/dL (ref >39.00)
LDL Cholesterol: 75 mg/dL (ref 0–99)
NonHDL: 97.23
Total CHOL/HDL Ratio: 3
Triglycerides: 111 mg/dL (ref 0.0–149.0)
VLDL: 22.2 mg/dL (ref 0.0–40.0)

## 2021-09-26 LAB — HEMOGLOBIN A1C: Hgb A1c MFr Bld: 5.1 % (ref 4.6–6.5)

## 2021-09-26 LAB — CBC WITH DIFFERENTIAL/PLATELET
Basophils Absolute: 0 10*3/uL (ref 0.0–0.1)
Basophils Relative: 0.6 % (ref 0.0–3.0)
Eosinophils Absolute: 0.2 10*3/uL (ref 0.0–0.7)
Eosinophils Relative: 2.5 % (ref 0.0–5.0)
HCT: 40.5 % (ref 36.0–46.0)
Hemoglobin: 13.3 g/dL (ref 12.0–15.0)
Lymphocytes Relative: 9.9 % — ABNORMAL LOW (ref 12.0–46.0)
Lymphs Abs: 0.7 10*3/uL (ref 0.7–4.0)
MCHC: 32.7 g/dL (ref 30.0–36.0)
MCV: 89 fl (ref 78.0–100.0)
Monocytes Absolute: 0.4 10*3/uL (ref 0.1–1.0)
Monocytes Relative: 6.2 % (ref 3.0–12.0)
Neutro Abs: 5.5 10*3/uL (ref 1.4–7.7)
Neutrophils Relative %: 80.8 % — ABNORMAL HIGH (ref 43.0–77.0)
Platelets: 119 10*3/uL — ABNORMAL LOW (ref 150.0–400.0)
RBC: 4.55 Mil/uL (ref 3.87–5.11)
RDW: 14 % (ref 11.5–15.5)
WBC: 6.8 10*3/uL (ref 4.0–10.5)

## 2021-09-26 MED ORDER — DOXYCYCLINE HYCLATE 100 MG PO TABS: 100.0000 mg | ORAL_TABLET | Freq: Two times a day (BID) | ORAL | 0 refills | Status: DC

## 2021-09-26 MED ORDER — PREDNISONE 20 MG PO TABS: 20.0000 mg | ORAL_TABLET | Freq: Every day | ORAL | 0 refills | Status: DC

## 2021-09-26 NOTE — Patient Instructions (Addendum)
Catherine Kelley to see you!  Let's see how labs look.  Let me know if the doxycycline does not work  Thank you!  Rich   If you have lab work done today you will be contacted with your lab results within the next 2 weeks.  If you have not heard from [name] then please contact us. The fastest way to get your results is to register for My Chart.   IF you received an x-ray today, you will receive an invoice from [facility]. Please contact [facility] at [phone] with questions or concerns regarding your invoice.   IF you received labwork today, you will receive an invoice from [facility]. Please contact [facility] at [phone] with questions or concerns regarding your invoice.   Our billing staff will not be able to assist you with questions regarding bills from these companies.  You will be contacted with the lab results as soon as they are available. The fastest way to get your results is to activate your My Chart account. Instructions are located on the last page of this paperwork. If you have not heard from [name] regarding the results in 2 weeks, please contact this office.

## 2021-09-26 NOTE — Progress Notes (Signed)
Established Patient Office Visit  Subjective:  Patient ID: Catherine Kelley, female    DOB: [birth year]  Age: 70 y.o. MRN: [id number]  CC:  Chief Complaint  Patient presents with   Follow-up    Patient states she is here for her 6 month follow up. Patient states her cellulitis is flaring up again.     HPI Catherine Kelley presents for 6 mo follow up   Feeling well - chronic conditions seem to be stable.  Anxiety and depression, bipolar I - continue to be managed by Dr. Adele Schilder.    Aortic atherosclerosis, emphysematous COPD, chronic bronchitis, htn - all stable at this time. Pt has no acute concerns.  Has seen Dr. Chase Caller in the past at [facility], Dr. Mare Ferrari at [facility]. On a follow up as needed basis. Has been since 2018 since seeing either of these providers. Echo in 2018 showed EF 60-65% with mild diastolic dysfunction.  Acute concern today for cellulitis.  This has been a chronic concern in the past. Typical symptoms for her - heat, redness, puffiness, discomfort About even bilaterally. No swelling or claudication suggestive of DVT.  We had referred her to vascular in April 2022 but unfortunately she was unable to make an appt. We will replace referral today.   Also notes congestion On and off for a few weeks. Taking mucinex with some relief, but limited.  Hx of copd, some yellow mucus with cough  Past Medical History:  Diagnosis Date   Anxiety    Arthritis    "jooints" (09/18/2017)   Asthma    Bipolar disorder ([place])    Bipolar I disorder ([place]) 10/11/2019   CAP (community acquired pneumonia) 09/18/2017   Cat allergies    Chronic bronchitis ([place])    "get it alot; maybe not q yr" (07/06/2014)   Chronic lower back pain    "spurs and pinched nerves" (09/18/2017)   COPD (chronic obstructive pulmonary disease) ([place])    DDD (degenerative disc disease), lumbar    Depression    psychiatrist Dr. Toy Care   Diastolic dysfunction    Emphysematous COPD ([place])     changes in right base along with patchy areas on last x-ray   Fall as cause of accidental injury at home as place of occurrence 10/11/2019   Fall involving wheelchair as cause of accidental injury 10/11/2019   Falls frequently    "several in 2017; fell at dr's office yesterday" (09/18/2017)   GERD (gastroesophageal reflux disease)    HCAP (healthcare-associated pneumonia) 02/12/2016   Heart murmur    History of cardiovascular stress test 08/15/2004   EF of 70% -- Normal stress cardiolite.  There is no evidence of ischemia and there is normal LV function. -- [name]. MD   History of echocardiogram 12/24/2006   Est. EF of 84-69% NORMAL LV SYSTOLIC FUNCTION WITH IMPAIRED RELAXATION -- MILD AORTIC SCLEROSIS -- NORMAL PALONARY ARTERY PRESSURE -- NO OLD ECHOS FOR COMPARISON -- [name], MD   Hypertension    essential hypertension   Migraine    "when I was having my periods; none since then" (09/18/2017)   Necrotic pneumonia ([place])    recurrent/notes 02/12/2016   Pneumonia    "several times since May 2015" (07/06/2014)   Pollen allergies    Tobacco abuse    smoking about 1/2 pk of cigarettes a day   Tobacco dependence due to cigarettes 10/11/2019   Traumatic hematoma of knee, right, initial encounter 10/11/2019  Past Surgical History:  Procedure Laterality Date   CAROTID STENT     DILATION AND CURETTAGE OF UTERUS     TUBAL LIGATION  1986    Family History  Problem Relation Age of Onset   Stroke Mother    Heart disease Father    Hyperlipidemia Father    Hypertension Father    Breast cancer Maternal Aunt    Asthma Maternal Grandmother     Social History   Socioeconomic History   Marital status: Married    Spouse name: Daiden Coltrane   Number of children: 2   Years of education: Not on file   Highest education level: Associate degree: occupational, Hotel manager, or vocational program  Occupational History   Occupation: Chemical engineer: [employer]     Comment: [employer]  Tobacco Use   Smoking status: Every Day    Packs/day: 0.50    Years: 46.00    Pack years: 23.00    Types: Cigarettes   Smokeless tobacco: Never  Vaping Use   Vaping Use: Never used  Substance and Sexual Activity   Alcohol use: Yes    Alcohol/week: 0.0 standard drinks    Comment: 09/18/2017 "might have 1 drink/month; if that"   Drug use: No   Sexual activity: Not Currently  Other Topics Concern   Not on file  Social History Narrative   Patient currently lives with her husband.    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 No bird or hot tub exposure. No mold exposure. No recent travel.   Social Determinants of Health   Financial Resource Strain: Low Risk    Difficulty of Paying Living Expenses: Not hard at all  Food Insecurity: No Food Insecurity   Worried About Charity fundraiser in the Last Year: Never true   Arboriculturist in the Last Year: Never true  Transportation Needs: Not on file  Physical Activity: Inactive   Days of Exercise per Week: 0 days   Minutes of Exercise per Session: 0 min  Stress: No Stress Concern Present   Feeling of Stress : Not at all  Social Connections: Moderately Isolated   Frequency of Communication with Friends and Family: Three times a week   Frequency of Social Gatherings with Friends and Family: Once a week   Attends Religious Services: Never   Marine scientist or Organizations: No   Attends Music therapist: Never   Marital Status: Married  Human resources officer Violence: Not At Risk   Fear of Current or Ex-Partner: No   Emotionally Abused: No   Physically Abused: No   Sexually Abused: No    Outpatient Medications Prior to Visit  Medication Sig Dispense Refill   albuterol (PROVENTIL) (2.5 MG/3ML) 0.083% nebulizer solution Take 2.5 mg by nebulization every 6 (six) hours as needed for wheezing or shortness of  breath.     albuterol (VENTOLIN HFA) 108 (90 Base) MCG/ACT inhaler TAKE 2 PUFFS BY MOUTH EVERY 6 HOURS AS NEEDED FOR WHEEZE OR SHORTNESS OF BREATH 18 each 0   alendronate (FOSAMAX) 70 MG tablet Take 1 tablet (70 mg total) by mouth every 7 (seven) days. Take with a full glass of water on an empty stomach. 4 tablet 11   amLODipine (NORVASC) 2.5 MG tablet TAKE 1 TABLET BY MOUTH EVERY DAY 90 tablet 1   aspirin  EC 81 MG tablet Take 81 mg by mouth daily as needed.      benzonatate (TESSALON) 100 MG capsule Take 1 capsule (100 mg total) by mouth 2 (two) times daily as needed for cough. 20 capsule 0   cetirizine (ZYRTEC) 10 MG tablet Take 1 tablet (10 mg total) by mouth daily. 30 tablet 11   fluticasone (FLONASE) 50 MCG/ACT nasal spray SPRAY 2 SPRAYS INTO EACH NOSTRIL EVERY DAY 48 mL 2   fluticasone (FLOVENT HFA) 110 MCG/ACT inhaler Inhale 1 puff into the lungs in the morning and at bedtime. 1 each 12   hydrOXYzine (VISTARIL) 25 MG capsule Take 1 capsule (25 mg total) by mouth at bedtime as needed for anxiety. 90 capsule 0   levofloxacin (LEVAQUIN) 250 MG tablet Take 1 tablet (250 mg total) by mouth daily. 7 tablet 0   lithium carbonate (LITHOBID) 300 MG CR tablet Take 1 tablet (300 mg total) by mouth 2 (two) times daily. 180 tablet 0   nortriptyline (PAMELOR) 50 MG capsule Take 1 capsule (50 mg total) by mouth at bedtime. 90 capsule 0   spironolactone (ALDACTONE) 50 MG tablet TAKE 1 TABLET BY MOUTH EVERY DAY 90 tablet 1   traMADol (ULTRAM) 50 MG tablet Take 0.5-1 tablets (25-50 mg total) by mouth every 12 (twelve) hours as needed. 15 tablet 0   triamcinolone cream (KENALOG) 0.1 % Apply 1 application topically 2 (two) times daily. 30 g 1   venlafaxine (EFFEXOR) 75 MG tablet Take 1 tablet (75 mg total) by mouth daily. 90 tablet 0   Vitamin D, Ergocalciferol, (DRISDOL) 1.25 MG (50000 UNIT) CAPS capsule TAKE 1 CAPSULE (50,000 UNITS TOTAL) BY MOUTH EVERY 7 (SEVEN) DAYS 12 capsule 0   Acetaminophen (TYLENOL  ARTHRITIS PAIN PO) Take by mouth. (Patient not taking: No sig reported)     No facility-administered medications prior to visit.    Allergies  Allergen Reactions   Sulfa Drugs Cross Reactors Hives and Rash   Ceclor [Cefaclor] Hives    Tolerated ceftazidime December 2016   Depakote Er [Divalproex Sodium Er] Other (See Comments)    Patient remarked that when she was taking this TWICE a day, she became "clumsy" and felt more prone to stumbling   Escitalopram Oxalate Other (See Comments)    Pt does not recall ever taking medication (??)   Latex Itching   Sertraline Hcl Other (See Comments)    Severe headaches    Tape Itching and Rash    Band-Aids, also (PAPER TAPE IS TOLERATED)    ROS Review of Systems  Constitutional: Negative.   HENT: Negative.    Eyes: Negative.   Respiratory: Negative.    Cardiovascular:  Positive for leg swelling. Negative for chest pain and palpitations.  Gastrointestinal: Negative.   Endocrine: Negative.   Genitourinary: Negative.   Musculoskeletal: Negative.   Skin:  Positive for rash. Negative for color change, pallor and wound.  Allergic/Immunologic: Negative.   Neurological: Negative.   Hematological: Negative.   Psychiatric/Behavioral: Negative.    All other systems reviewed and are negative.    Objective:    Physical Exam Vitals and nursing note reviewed.  Constitutional:      General: She is not in acute distress.    Appearance: Normal appearance. She is normal weight. She is not ill-appearing, toxic-appearing or diaphoretic.  Cardiovascular:     Rate and Rhythm: Normal rate and regular rhythm.     Heart sounds: Normal heart sounds. No murmur heard.   No friction rub.  No gallop.  Pulmonary:     Effort: Pulmonary effort is normal. No respiratory distress.     Breath sounds: No stridor. Wheezing present. No rhonchi or rales.  Chest:     Chest wall: No tenderness.  Skin:    General: Skin is warm and dry.  Neurological:     General:  No focal deficit present.     Mental Status: She is alert and oriented to person, place, and time. Mental status is at baseline.  Psychiatric:        Mood and Affect: Mood normal.        Behavior: Behavior normal.        Thought Content: Thought content normal.        Judgment: Judgment normal.    BP (!) 153/88   Pulse 88   Temp 97.8 F (36.6 C) (Temporal)   Resp 18   Ht 5\' 3"  (1.6 m)   Wt 109 lb 3.2 oz (49.5 kg)   SpO2 99%   BMI 19.34 kg/m  Wt Readings from Last 3 Encounters:  09/26/21 109 lb 3.2 oz (49.5 kg)  03/27/21 108 lb 9.6 oz (49.3 kg)  11/08/20 114 lb 10.2 oz (52 kg)     Health Maintenance Due  Topic Date Due   Zoster Vaccines- Shingrix (1 of 2) Never done   COVID-19 Vaccine (2 - Pfizer risk series) 04/29/2020   INFLUENZA VACCINE  07/01/2021    There are no preventive care reminders to display for this patient.  Lab Results  Component Value Date   TSH 7.598 (H) 11/08/2020   Lab Results  Component Value Date   WBC 10.4 11/08/2020   HGB 15.0 11/08/2020   HCT 44.5 11/08/2020   MCV 91.0 11/08/2020   PLT 170 11/08/2020   Lab Results  Component Value Date   NA 135 11/08/2020   K 3.5 11/08/2020   CO2 25 11/08/2020   GLUCOSE 88 11/08/2020   BUN 13 11/08/2020   CREATININE 0.65 11/08/2020   BILITOT 0.7 11/08/2020   ALKPHOS 154 (H) 11/08/2020   AST 24 11/08/2020   ALT 20 11/08/2020   PROT 7.2 11/08/2020   ALBUMIN 3.8 11/08/2020   CALCIUM 9.9 11/08/2020   ANIONGAP 10 11/08/2020   GFR 186.01 02/04/2016   Lab Results  Component Value Date   CHOL 160 11/08/2020   Lab Results  Component Value Date   HDL 73 11/08/2020   Lab Results  Component Value Date   LDLCALC 72 11/08/2020   Lab Results  Component Value Date   TRIG 73 11/08/2020   Lab Results  Component Value Date   CHOLHDL 2.2 11/08/2020   Lab Results  Component Value Date   HGBA1C 4.8 11/08/2020      Assessment & Plan:   Problem List Items Addressed This Visit        Respiratory   Stage 2 moderate COPD by GOLD classification ([place]) (Chronic)   Relevant Medications   predniSONE (DELTASONE) 20 MG tablet   Other Visit Diagnoses     Aortic atherosclerosis ([place])    -  Primary   Relevant Orders   CBC with Differential/Platelet   Lipid panel   Comprehensive metabolic panel   Hemoglobin A1c   Ambulatory referral to Vascular Surgery   Prediabetes       Relevant Orders   CBC with Differential/Platelet   Lipid panel   Comprehensive metabolic panel   Hemoglobin A1c   Cellulitis of both lower extremities  Relevant Medications   doxycycline (VIBRA-TABS) 100 MG tablet   Other Relevant Orders   Ambulatory referral to Vascular Surgery   Chronic cellulitis       Relevant Orders   Ambulatory referral to Vascular Surgery   Ambulatory referral to Pulmonology   COPD exacerbation ([place])       Relevant Medications   doxycycline (VIBRA-TABS) 100 MG tablet   predniSONE (DELTASONE) 20 MG tablet   Other Relevant Orders   Ambulatory referral to Pulmonology       Meds ordered this encounter  Medications   doxycycline (VIBRA-TABS) 100 MG tablet    Sig: Take 1 tablet (100 mg total) by mouth 2 (two) times daily.    Dispense:  20 tablet    Refill:  0    Order Specific Question:   Supervising Provider    Answer:   Carlota Raspberry, JEFFREY R [[id number]]   predniSONE (DELTASONE) 20 MG tablet    Sig: Take 1 tablet (20 mg total) by mouth daily with breakfast.    Dispense:  5 tablet    Refill:  0    Order Specific Question:   Supervising Provider    Answer:   Carlota Raspberry, JEFFREY R [[id number]]    Follow-up: Return in about 6 months (around 03/27/2022) for chronic conditions.   PLAN Wheezing and congestion concerning for COPD exacerbation.  Cellulitis in both lower extremities. I think this is complicated by PVD. Will refer to vascular. Refer back to pulmonary as she has been lost to follow up . Labs collected. Will follow up with the patient as warranted. Hydrocodone for occ  pain relief. Has used in past. Hesitant to use NSAIDs with long term lithium use and no recent renal function testing. Patient encouraged to call clinic with any questions, comments, or concerns.  Maximiano Coss, NP

## 2021-09-27 ENCOUNTER — Telehealth: Payer: Self-pay | Admitting: Registered Nurse

## 2021-09-27 ENCOUNTER — Encounter: Payer: Self-pay | Admitting: Registered Nurse

## 2021-09-27 MED ORDER — HYDROCODONE-ACETAMINOPHEN 5-300 MG PO TABS: 1.0000 | ORAL_TABLET | Freq: Two times a day (BID) | ORAL | 0 refills | Status: DC | PRN

## 2021-09-27 NOTE — Telephone Encounter (Signed)
Patient needs a medication refill for Hydrocodone.

## 2021-09-27 NOTE — Telephone Encounter (Signed)
Patient states that her hydrocodone for pain dif not get called in yesterday.   Please resubmit.  Thanks

## 2021-09-27 NOTE — Telephone Encounter (Signed)
Have sent  Thanks,  Denice Paradise

## 2021-09-27 NOTE — Telephone Encounter (Signed)
Called and left a message for Catherine Kelley to give [name] a call back so we could schedule her 6 month follow up from her my chart visit on 09/26/2021.

## 2021-09-27 NOTE — Addendum Note (Signed)
Addended by: Maximiano Coss on: 09/27/2021 02:38 PM   Modules accepted: Orders

## 2021-10-16 ENCOUNTER — Encounter (HOSPITAL_COMMUNITY): Payer: Self-pay | Admitting: Psychiatry

## 2021-10-16 ENCOUNTER — Telehealth (HOSPITAL_BASED_OUTPATIENT_CLINIC_OR_DEPARTMENT_OTHER): Payer: Medicare PPO | Admitting: Psychiatry

## 2021-10-16 ENCOUNTER — Other Ambulatory Visit: Payer: Self-pay

## 2021-10-16 DIAGNOSIS — F419 Anxiety disorder, unspecified: Secondary | ICD-10-CM | POA: Diagnosis not present

## 2021-10-16 DIAGNOSIS — F319 Bipolar disorder, unspecified: Secondary | ICD-10-CM

## 2021-10-16 MED ORDER — LITHIUM CARBONATE ER 300 MG PO TBCR: 300.0000 mg | EXTENDED_RELEASE_TABLET | Freq: Three times a day (TID) | ORAL | 0 refills | Status: DC

## 2021-10-16 MED ORDER — VENLAFAXINE HCL 75 MG PO TABS: 75.0000 mg | ORAL_TABLET | Freq: Every day | ORAL | 0 refills | Status: DC

## 2021-10-16 MED ORDER — NORTRIPTYLINE HCL 50 MG PO CAPS: 50.0000 mg | ORAL_CAPSULE | Freq: Every day | ORAL | 0 refills | Status: DC

## 2021-10-16 MED ORDER — HYDROXYZINE PAMOATE 25 MG PO CAPS: 25.0000 mg | ORAL_CAPSULE | Freq: Every evening | ORAL | 0 refills | Status: DC | PRN

## 2021-10-16 NOTE — Progress Notes (Signed)
Virtual Visit via Telephone Note  I connected with Catherine Kelley on 10/16/21 at  2:00 PM EST by telephone and verified that I am speaking with the correct person using two identifiers.  Location: Patient: Home Provider: Office   I discussed the limitations, risks, security and privacy concerns of performing an evaluation and management service by telephone and the availability of in person appointments. I also discussed with the patient that there may be a patient responsible charge related to this service. The patient expressed understanding and agreed to proceed.   History of Present Illness: Patient is evaluated by phone session.  She endorsed lately more anxious, irritable and some highs and lows.  She is not sure what the trigger but having racing thoughts.  She like to try a higher dose of lithium which we have discussed in the past.  She also not taking the hydroxyzine as did not pick up from the pharmacy.  She do remember it helps her sleep and lately sleep has been on and off.  Recently she had a visit with her PCP and labs were drawn.  She do not recall adding any new medication.  She lives with her husband who is supportive.  Since taking the medication regularly she noticed her dizziness is much better.  However she is still do not drive.  Her last lithium level was 0.5 and we have recommended to try higher dose.  Now she realizes that she should consider taking higher dose of lithium to help her residual mood lability.  She denies any paranoia, hallucination, suicidal thoughts.  Her appetite is okay.  Her weight is stable.  She has a plan to spend Thanksgiving with her older daughter.  Past Psychiatric History:  H/O depression, anxiety and bipolar d/o. Had tried Paxil, Prozac and Zoloft that cause headaches. Abilify caused stiffnes.  Took Xanax for many years however it was not renewed by primary care physician when Dr. Toy Care office did not accept her insurance.  Did not recall any history  of suicidal attempt or psychiatric inpatient treatment.   Recent Results (from the past 2160 hour(s))  CBC with Differential/Platelet     Status: Abnormal   Collection Time: 09/26/21 11:45 AM  Result Value Ref Range   WBC 6.8 4.0 - 10.5 K/uL   RBC 4.55 3.87 - 5.11 Mil/uL   Hemoglobin 13.3 12.0 - 15.0 g/dL   HCT 40.5 36.0 - 46.0 %   MCV 89.0 78.0 - 100.0 fl   MCHC 32.7 30.0 - 36.0 g/dL   RDW 14.0 11.5 - 15.5 %   Platelets 119.0 (L) 150.0 - 400.0 K/uL   Neutrophils Relative % 80.8 (H) 43.0 - 77.0 %   Lymphocytes Relative 9.9 (L) 12.0 - 46.0 %   Monocytes Relative 6.2 3.0 - 12.0 %   Eosinophils Relative 2.5 0.0 - 5.0 %   Basophils Relative 0.6 0.0 - 3.0 %   Neutro Abs 5.5 1.4 - 7.7 K/uL   Lymphs Abs 0.7 0.7 - 4.0 K/uL   Monocytes Absolute 0.4 0.1 - 1.0 K/uL   Eosinophils Absolute 0.2 0.0 - 0.7 K/uL   Basophils Absolute 0.0 0.0 - 0.1 K/uL  Lipid panel     Status: None   Collection Time: 09/26/21 11:45 AM  Result Value Ref Range   Cholesterol 161 0 - 200 mg/dL    Comment: ATP III Classification       Desirable:  < 200 mg/dL  Borderline High:  200 - 239 mg/dL          High:  > = 240 mg/dL   Triglycerides 111.0 0.0 - 149.0 mg/dL    Comment: Normal:  <150 mg/dLBorderline High:  150 - 199 mg/dL   HDL 63.40 >39.00 mg/dL   VLDL 22.2 0.0 - 40.0 mg/dL   LDL Cholesterol 75 0 - 99 mg/dL   Total CHOL/HDL Ratio 3     Comment:                Men          Women1/2 Average Risk     3.4          3.3Average Risk          5.0          4.42X Average Risk          9.6          7.13X Average Risk          15.0          11.0                       NonHDL 97.23     Comment: NOTE:  Non-HDL goal should be 30 mg/dL higher than patient's LDL goal (i.e. LDL goal of < 70 mg/dL, would have non-HDL goal of < 100 mg/dL)  Comprehensive metabolic panel     Status: Abnormal   Collection Time: 09/26/21 11:45 AM  Result Value Ref Range   Sodium 133 (L) 135 - 145 mEq/L   Potassium 4.1 3.5 - 5.1 mEq/L    Chloride 100 96 - 112 mEq/L   CO2 26 19 - 32 mEq/L   Glucose, Bld 95 70 - 99 mg/dL   BUN 8 6 - 23 mg/dL   Creatinine, Ser 0.50 0.40 - 1.20 mg/dL   Total Bilirubin 0.4 0.2 - 1.2 mg/dL   Alkaline Phosphatase 123 (H) 39 - 117 U/L   AST 23 0 - 37 U/L   ALT 15 0 - 35 U/L   Total Protein 7.2 6.0 - 8.3 g/dL   Albumin 3.8 3.5 - 5.2 g/dL   GFR 94.56 >60.00 mL/min    Comment: Calculated using the CKD-EPI Creatinine Equation (2021)   Calcium 9.1 8.4 - 10.5 mg/dL  Hemoglobin A1c     Status: None   Collection Time: 09/26/21 11:45 AM  Result Value Ref Range   Hgb A1c MFr Bld 5.1 4.6 - 6.5 %    Comment: Glycemic Control Guidelines for People with Diabetes:Non Diabetic:  <6%Goal of Therapy: <7%Additional Action Suggested:  >8%      Psychiatric Specialty Exam: Physical Exam  Review of Systems  Weight 109 lb (49.4 kg).There is no height or weight on file to calculate BMI.  General Appearance: NA  Eye Contact:  NA  Speech:  Slow  Volume:  Decreased  Mood:  Anxious and Dysphoric  Affect:  NA  Thought Process:  Descriptions of Associations: Intact  Orientation:  Full (Time, Place, and Person)  Thought Content:  Rumination  Suicidal Thoughts:  No  Homicidal Thoughts:  No  Memory:  Immediate;   Fair Recent;   Fair Remote;   Fair  Judgement:  Fair  Insight:  Fair  Psychomotor Activity:  NA  Concentration:  Concentration: Fair and Attention Span: Fair  Recall:  [employer] of Knowledge:  Fair  Language:  Good  Akathisia:  No  Handed:  Right  AIMS (if indicated):     Assets:  Communication Skills Desire for Improvement Housing Resilience Social Support  ADL's:  Intact  Cognition:  WNL  Sleep:   fair      Assessment and Plan: Bipolar disorder type I.  Anxiety.  I reviewed blood work results.  Her hemoglobin A1c and other labs are okay.  Discuss noncompliant with hydroxyzine which is not helping her sleep.  Patient like to try lithium higher dose.  Currently she is taking 600 mg a  day and we will increase to 900 mg a day.  However I recommended if the hydroxyzine helps her anxiety and mood and she cannot keep the lithium 600 mg a day.  She agreed with the plan.  She promised that she will start the hydroxyzine and pick up the medicine from the pharmacy.  We will continue Effexor 75 mg daily, nortriptyline 50 mg at bedtime, hydroxyzine 25 mg at bedtime and try lithium 300 mg in the morning and 600 mg at bedtime.  I recommended to call us back if there is any question or any concern.  Follow Up Instructions:    I discussed the assessment and treatment plan with the patient. The patient was provided an opportunity to ask questions and all were answered. The patient agreed with the plan and demonstrated an understanding of the instructions.   The patient was advised to call back or seek an in-person evaluation if the symptoms worsen or if the condition fails to improve as anticipated.  I provided 19 minutes of non-face-to-face time during this encounter.   Kathlee Nations, MD

## 2021-10-19 ENCOUNTER — Other Ambulatory Visit (HOSPITAL_COMMUNITY): Payer: Self-pay | Admitting: Psychiatry

## 2021-10-19 DIAGNOSIS — F319 Bipolar disorder, unspecified: Secondary | ICD-10-CM

## 2021-11-15 ENCOUNTER — Other Ambulatory Visit: Payer: Self-pay | Admitting: Registered Nurse

## 2021-11-17 ENCOUNTER — Other Ambulatory Visit (HOSPITAL_COMMUNITY): Payer: Self-pay | Admitting: Psychiatry

## 2021-11-17 DIAGNOSIS — F319 Bipolar disorder, unspecified: Secondary | ICD-10-CM

## 2022-01-09 NOTE — Telephone Encounter (Signed)
Erroneous encounter. Please disregard.

## 2022-01-14 ENCOUNTER — Other Ambulatory Visit (HOSPITAL_COMMUNITY): Payer: Self-pay | Admitting: Psychiatry

## 2022-01-14 DIAGNOSIS — F419 Anxiety disorder, unspecified: Secondary | ICD-10-CM

## 2022-01-15 ENCOUNTER — Telehealth (HOSPITAL_BASED_OUTPATIENT_CLINIC_OR_DEPARTMENT_OTHER): Payer: Medicare PPO | Admitting: Psychiatry

## 2022-01-15 ENCOUNTER — Encounter (HOSPITAL_COMMUNITY): Payer: Self-pay | Admitting: Psychiatry

## 2022-01-15 ENCOUNTER — Other Ambulatory Visit (HOSPITAL_COMMUNITY): Payer: Self-pay | Admitting: *Deleted

## 2022-01-15 ENCOUNTER — Other Ambulatory Visit (HOSPITAL_COMMUNITY): Payer: Self-pay | Admitting: Psychiatry

## 2022-01-15 ENCOUNTER — Other Ambulatory Visit: Payer: Self-pay

## 2022-01-15 DIAGNOSIS — F319 Bipolar disorder, unspecified: Secondary | ICD-10-CM

## 2022-01-15 DIAGNOSIS — F419 Anxiety disorder, unspecified: Secondary | ICD-10-CM

## 2022-01-15 DIAGNOSIS — Z5181 Encounter for therapeutic drug level monitoring: Secondary | ICD-10-CM

## 2022-01-15 MED ORDER — NORTRIPTYLINE HCL 50 MG PO CAPS: 50.0000 mg | ORAL_CAPSULE | Freq: Every day | ORAL | 0 refills | Status: DC

## 2022-01-15 MED ORDER — LITHIUM CARBONATE ER 300 MG PO TBCR: 300.0000 mg | EXTENDED_RELEASE_TABLET | Freq: Three times a day (TID) | ORAL | 0 refills | Status: DC

## 2022-01-15 MED ORDER — VENLAFAXINE HCL 75 MG PO TABS: 75.0000 mg | ORAL_TABLET | Freq: Every day | ORAL | 0 refills | Status: DC

## 2022-01-15 NOTE — Progress Notes (Signed)
Virtual Visit via Telephone Note  I connected with Catherine Kelley on 01/15/22 at  2:40 PM EST by telephone and verified that I am speaking with the correct person using two identifiers.  Location: Patient: Home Provider: Home Office   I discussed the limitations, risks, security and privacy concerns of performing an evaluation and management service by telephone and the availability of in person appointments. I also discussed with the patient that there may be a patient responsible charge related to this service. The patient expressed understanding and agreed to proceed.   History of Present Illness: Patient is evaluated by phone session.  On the last visit we increased lithium and she is taking 900 mg a day.  She reported much improvement in her mood and she feels stable and denies any highs or lows.  She is sleeping good.  She lives with her husband and 2 dogs.  Patient has no tremor or shakes or any EPS.  She denies any paranoia, hallucination or any panic attack.  She had good Christmas.  She feel that she is doing well and her current medication are working very well.  She has no plan to change the dose.  She denies any crying spells, feeling of hopelessness or worthlessness.  She denies any suicidal thoughts.  Her energy level is good.  She has not taken hydroxyzine lately but occasionally she takes when she cannot sleep.  Past Psychiatric History:  H/O depression, anxiety and bipolar d/o. Had tried Paxil, Prozac and Zoloft that cause headaches. Abilify caused stiffnes.  Took Xanax for many years however it was not renewed by primary care physician when Dr. Toy Care office did not accept her insurance.  Did not recall any history of suicidal attempt or psychiatric inpatient treatment.    Psychiatric Specialty Exam: Physical Exam  Review of Systems  Weight 110 lb (49.9 kg).There is no height or weight on file to calculate BMI.  General Appearance: NA  Eye Contact:  NA  Speech:  Slow  Volume:   Decreased  Mood:  Euthymic  Affect:  NA  Thought Process:  Goal Directed  Orientation:  Full (Time, Place, and Person)  Thought Content:  WDL  Suicidal Thoughts:  No  Homicidal Thoughts:  No  Memory:  Immediate;   Fair Recent;   Fair Remote;   Fair  Judgement:  Intact  Insight:  Present  Psychomotor Activity:  NA  Concentration:  Concentration: Fair and Attention Span: Fair  Recall:  [employer] of Knowledge:  Fair  Language:  Fair  Akathisia:  No  Handed:  Right  AIMS (if indicated):     Assets:  Communication Skills Desire for Improvement Housing Social Support  ADL's:  Intact  Cognition:  WNL  Sleep:   improved      Assessment and Plan: Bipolar disorder type I.  Anxiety.  Patient doing better with increased dose of lithium.  We will do a lithium level.  Continue lithium 600 mg in the morning and 300 mg at bedtime.  Continue Effexor 75 mg daily and nortriptyline 50 mg at bedtime.  She does not need a new prescription of hydroxyzine at this time.  Patient has upcoming appointment with her PCP in April.  I recommend to keep that appointment.  She is not taking Fosamax and like I discussed with the PCP on her next appointment.  Follow up in 3 months.  Follow Up Instructions:    I discussed the assessment and treatment plan with the patient.  The patient was provided an opportunity to ask questions and all were answered. The patient agreed with the plan and demonstrated an understanding of the instructions.   The patient was advised to call back or seek an in-person evaluation if the symptoms worsen or if the condition fails to improve as anticipated.  I provided 23 minutes of non-face-to-face time during this encounter.   Kathlee Nations, MD

## 2022-01-23 DIAGNOSIS — F319 Bipolar disorder, unspecified: Secondary | ICD-10-CM | POA: Diagnosis not present

## 2022-01-23 DIAGNOSIS — Z5181 Encounter for therapeutic drug level monitoring: Secondary | ICD-10-CM | POA: Diagnosis not present

## 2022-01-24 ENCOUNTER — Telehealth (HOSPITAL_COMMUNITY): Payer: Self-pay | Admitting: *Deleted

## 2022-01-24 LAB — LITHIUM LEVEL: Lithium Lvl: 0.9 mmol/L (ref 0.5–1.2)

## 2022-01-24 NOTE — Telephone Encounter (Signed)
Lithium level received from [facility]. Lithium level 0.9 mmol/L. Also available to view in Epic.

## 2022-02-07 ENCOUNTER — Other Ambulatory Visit: Payer: Self-pay | Admitting: Registered Nurse

## 2022-02-12 ENCOUNTER — Ambulatory Visit (INDEPENDENT_AMBULATORY_CARE_PROVIDER_SITE_OTHER): Payer: Medicare PPO | Admitting: Registered Nurse

## 2022-02-12 ENCOUNTER — Encounter: Payer: Self-pay | Admitting: Registered Nurse

## 2022-02-12 VITALS — BP 112/70 | HR 90 | Temp 98.0°F | Resp 18 | Ht 63.0 in | Wt 101.0 lb

## 2022-02-12 DIAGNOSIS — R300 Dysuria: Secondary | ICD-10-CM

## 2022-02-12 DIAGNOSIS — R051 Acute cough: Secondary | ICD-10-CM | POA: Diagnosis not present

## 2022-02-12 MED ORDER — GUAIFENESIN-DM 100-10 MG/5ML PO SYRP: 5.0000 mL | ORAL_SOLUTION | Freq: Every evening | ORAL | 0 refills | Status: DC | PRN

## 2022-02-12 MED ORDER — PREDNISONE 10 MG PO TABS: 10.0000 mg | ORAL_TABLET | Freq: Every day | ORAL | 0 refills | Status: DC

## 2022-02-12 MED ORDER — DOXYCYCLINE HYCLATE 100 MG PO TABS: 100.0000 mg | ORAL_TABLET | Freq: Two times a day (BID) | ORAL | 0 refills | Status: DC

## 2022-02-12 NOTE — Patient Instructions (Addendum)
Ms. Delano -  ? ?Always a pleasure. ? ? ?Doxycycline - antibiotic for pneumonia ?Prednisone - to help inflammation ?Robitussin dm - to help with cough. ? ?I will test urine for UTI and let you know about results. ? ?If you are not well by Monday, call me ? ?Thank you ? ?Rich  ? ? ? ?If you have lab work done today you will be contacted with your lab results within the next 2 weeks.  If you have not heard from [name] then please contact us. The fastest way to get your results is to register for My Chart. ? ? ?IF you received an x-ray today, you will receive an invoice from [facility]. Please contact [facility] at [phone] with questions or concerns regarding your invoice.  ? ?IF you received labwork today, you will receive an invoice from [place]. Please contact [facility] at [phone] with questions or concerns regarding your invoice.  ? ?Our billing staff will not be able to assist you with questions regarding bills from these companies. ? ?You will be contacted with the lab results as soon as they are available. The fastest way to get your results is to activate your My Chart account. Instructions are located on the last page of this paperwork. If you have not heard from [name] regarding the results in 2 weeks, please contact this office. ?  ? ? ?

## 2022-02-12 NOTE — Progress Notes (Signed)
? ?Established Patient Office Visit ? ?Subjective:  ?Patient ID: Catherine Kelley, female    DOB: 09-16-1951  Age: 71 y.o. MRN: [id number] ? ?CC:  ?Chief Complaint  ?Patient presents with  ? Cough  ?  Patient states she has been having a cough , wheezing and thinks its pnuemonia.  ? Urinary Tract Infection  ?  Patient states she feels like she has a UTI. Patient is having some frequent urination, foul odor .  ? ? ?HPI ?[name] presents for cough, UTI ? ?Cough ?Sudden onset 1-2 weeks ago ?Wheezing, shob ?Loose bowel movements. ? ?UTI ?Dysuria, pressure, frequency, malodorous urine ?No gross hematuria ? ? ?Denies: chest pain, flank pain, fevers, chills, loc, nvd, palpitations, dependent edema. ? ? ?Past Medical History:  ?Diagnosis Date  ? Anxiety   ? Arthritis   ? "jooints" (09/18/2017)  ? Asthma   ? Bipolar disorder ([place])   ? Bipolar I disorder ([place]) 10/11/2019  ? CAP (community acquired pneumonia) 09/18/2017  ? Cat allergies   ? Chronic bronchitis ([place])   ? "get it alot; maybe not q yr" (07/06/2014)  ? Chronic lower back pain   ? "spurs and pinched nerves" (09/18/2017)  ? COPD (chronic obstructive pulmonary disease) ([place])   ? DDD (degenerative disc disease), lumbar   ? Depression   ? psychiatrist Dr. Toy Care  ? Diastolic dysfunction   ? Emphysematous COPD ([place])   ? changes in right base along with patchy areas on last x-ray  ? Fall as cause of accidental injury at home as place of occurrence 10/11/2019  ? Fall involving wheelchair as cause of accidental injury 10/11/2019  ? Falls frequently   ? "several in 2017; fell at dr's office yesterday" (09/18/2017)  ? GERD (gastroesophageal reflux disease)   ? HCAP (healthcare-associated pneumonia) 02/12/2016  ? Heart murmur   ? History of cardiovascular stress test 08/15/2004  ? EF of 70% -- Normal stress cardiolite.  There is no evidence of ischemia and there is normal LV function. -- [name]. MD  ? History of echocardiogram 12/24/2006  ? Est. EF of 55-60% NORMAL LV  SYSTOLIC FUNCTION WITH IMPAIRED RELAXATION -- MILD AORTIC SCLEROSIS -- NORMAL PALONARY ARTERY PRESSURE -- NO OLD ECHOS FOR COMPARISON -- [name], MD  ? Hypertension   ? essential hypertension  ? Migraine   ? "when I was having my periods; none since then" (09/18/2017)  ? Necrotic pneumonia ([place])   ? recurrent/notes 02/12/2016  ? Pneumonia   ? "several times since May 2015" (07/06/2014)  ? Pollen allergies   ? Tobacco abuse   ? smoking about 1/2 pk of cigarettes a day  ? Tobacco dependence due to cigarettes 10/11/2019  ? Traumatic hematoma of knee, right, initial encounter 10/11/2019  ? ? ?Past Surgical History:  ?Procedure Laterality Date  ? CAROTID STENT    ? DILATION AND CURETTAGE OF UTERUS    ? TUBAL LIGATION  1986  ? ? ?Family History  ?Problem Relation Age of Onset  ? Stroke Mother   ? Heart disease Father   ? Hyperlipidemia Father   ? Hypertension Father   ? Breast cancer Maternal Aunt   ? Asthma Maternal Grandmother   ? ? ?Social History  ? ?Socioeconomic History  ? Marital status: Married  ?  Spouse name: Gaylin Osoria  ? Number of children: 2  ? Years of education: Not on file  ? Highest education level: Associate degree: occupational, Hotel manager, or vocational program  ?Occupational History  ?  Occupation: Press photographer ASSOCIATE  ?  Employer: [employer]  ?  Comment: [employer]  ?Tobacco Use  ? Smoking status: Every Day  ?  Packs/day: 0.50  ?  Years: 46.00  ?  Pack years: 23.00  ?  Types: Cigarettes  ? Smokeless tobacco: Never  ?Vaping Use  ? Vaping Use: Never used  ?Substance and Sexual Activity  ? Alcohol use: Yes  ?  Alcohol/week: 0.0 standard drinks  ?  Comment: 09/18/2017 "might have 1 drink/month; if that"  ? Drug use: No  ? Sexual activity: Not Currently  ?Other Topics Concern  ? Not on file  ?Social History Narrative  ? Patient currently lives with her husband.   ? 2 children - 4 grandchildren - all local  ? 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 They do have a dog. No bird or hot tub  exposure. No mold exposure. No recent travel.  ? ?Social Determinants of Health  ? ?Financial Resource Strain: Low Risk   ? Difficulty of Paying Living Expenses: Not hard at all  ?Food Insecurity: No Food Insecurity  ? Worried About Charity fundraiser in the Last Year: Never true  ? Ran Out of Food in the Last Year: Never true  ?Transportation Needs: Not on file  ?Physical Activity: Inactive  ? Days of Exercise per Week: 0 days  ? Minutes of Exercise per Session: 0 min  ?Stress: No Stress Concern Present  ? Feeling of Stress : Not at all  ?Social Connections: Moderately Isolated  ? Frequency of Communication with Friends and Family: Three times a week  ? Frequency of Social Gatherings with Friends and Family: Once a week  ? Attends Religious Services: Never  ? Active Member of Clubs or Organizations: No  ? Attends Archivist Meetings: Never  ? Marital Status: Married  ?Intimate Partner Violence: Not At Risk  ? Fear of Current or Ex-Partner: No  ? Emotionally Abused: No  ? Physically Abused: No  ? Sexually Abused: No  ? ? ?Outpatient Medications Prior to Visit  ?Medication Sig Dispense Refill  ? albuterol (VENTOLIN HFA) 108 (90 Base) MCG/ACT inhaler TAKE 2 PUFFS BY MOUTH EVERY 6 HOURS AS NEEDED FOR WHEEZE OR SHORTNESS OF BREATH 18 each 0  ? amLODipine (NORVASC) 2.5 MG tablet TAKE 1 TABLET BY MOUTH EVERY DAY 90 tablet 1  ? cetirizine (ZYRTEC) 10 MG tablet Take 1 tablet (10 mg total) by mouth daily. 30 tablet 11  ? fluticasone (FLONASE) 50 MCG/ACT nasal spray SPRAY 2 SPRAYS INTO EACH NOSTRIL EVERY DAY 48 mL 2  ? HYDROcodone-Acetaminophen 5-300 MG TABS Take 1 tablet by mouth 2 (two) times daily as needed. 30 tablet 0  ? levofloxacin (LEVAQUIN) 250 MG tablet Take 1 tablet (250 mg total) by mouth daily. 7 tablet 0  ? lithium carbonate (LITHOBID) 300 MG CR tablet Take 1 tablet (300 mg total) by mouth 3 (three) times daily. 270 tablet 0  ? nortriptyline (PAMELOR) 50 MG capsule Take 1 capsule (50 mg total) by  mouth at bedtime. 90 capsule 0  ? spironolactone (ALDACTONE) 50 MG tablet TAKE 1 TABLET BY MOUTH EVERY DAY 90 tablet 1  ? triamcinolone cream (KENALOG) 0.1 % Apply 1 application topically 2 (two) times daily. 30 g 1  ? venlafaxine (EFFEXOR) 75 MG tablet Take 1 tablet (75 mg total) by mouth daily. 90 tablet 0  ? Vitamin D, Ergocalciferol, (DRISDOL) 1.25 MG (50000 UNIT) CAPS capsule TAKE  1 CAPSULE (50,000 UNITS TOTAL) BY MOUTH EVERY 7 (SEVEN) DAYS 12 capsule 0  ? alendronate (FOSAMAX) 70 MG tablet Take 1 tablet (70 mg total) by mouth every 7 (seven) days. Take with a full glass of water on an empty stomach. (Patient not taking: Reported on 01/15/2022) 4 tablet 11  ? aspirin EC 81 MG tablet Take 81 mg by mouth daily as needed.  (Patient not taking: Reported on 01/15/2022)    ? hydrOXYzine (VISTARIL) 25 MG capsule Take 1 capsule (25 mg total) by mouth at bedtime as needed for anxiety. (Patient not taking: Reported on 01/15/2022) 90 capsule 0  ? ?No facility-administered medications prior to visit.  ? ? ?Allergies  ?Allergen Reactions  ? Sulfa Drugs Cross Reactors Hives and Rash  ? Ceclor [Cefaclor] Hives  ?  Tolerated ceftazidime December 2016  ? Depakote Er [Divalproex Sodium Er] Other (See Comments)  ?  Patient remarked that when she was taking this TWICE a day, she became "clumsy" and felt more prone to stumbling  ? Escitalopram Oxalate Other (See Comments)  ?  Pt does not recall ever taking medication (??)  ? Latex Itching  ? Sertraline Hcl Other (See Comments)  ?  Severe headaches ?  ? Tape Itching and Rash  ?  Band-Aids, also (PAPER TAPE IS TOLERATED)  ? ? ?ROS ?Review of Systems ?Per hpi  ? ?  ?Objective:  ?  ?Physical Exam ?Vitals and nursing note reviewed.  ?Constitutional:   ?   General: She is not in acute distress. ?   Appearance: Normal appearance. She is normal weight. She is not ill-appearing, toxic-appearing or diaphoretic.  ?Cardiovascular:  ?   Rate and Rhythm: Normal rate and regular rhythm.  ?   Heart  sounds: Normal heart sounds. No murmur heard. ?  No friction rub. No gallop.  ?Pulmonary:  ?   Effort: Pulmonary effort is normal. No respiratory distress.  ?   Breath sounds: No stridor. Wheezing (panlobular

## 2022-02-13 ENCOUNTER — Other Ambulatory Visit: Payer: Self-pay | Admitting: Registered Nurse

## 2022-02-16 ENCOUNTER — Other Ambulatory Visit: Payer: Self-pay | Admitting: Registered Nurse

## 2022-02-16 DIAGNOSIS — R051 Acute cough: Secondary | ICD-10-CM

## 2022-02-18 ENCOUNTER — Other Ambulatory Visit: Payer: Self-pay | Admitting: Registered Nurse

## 2022-02-18 ENCOUNTER — Telehealth: Payer: Self-pay

## 2022-02-18 DIAGNOSIS — R051 Acute cough: Secondary | ICD-10-CM

## 2022-02-18 DIAGNOSIS — J22 Unspecified acute lower respiratory infection: Secondary | ICD-10-CM

## 2022-02-18 DIAGNOSIS — J441 Chronic obstructive pulmonary disease with (acute) exacerbation: Secondary | ICD-10-CM

## 2022-02-18 MED ORDER — GUAIFENESIN-DM 100-10 MG/5ML PO SYRP: 5.0000 mL | ORAL_SOLUTION | Freq: Every evening | ORAL | 0 refills | Status: DC | PRN

## 2022-02-18 MED ORDER — LEVOFLOXACIN 250 MG PO TABS: 250.0000 mg | ORAL_TABLET | Freq: Every day | ORAL | 0 refills | Status: DC

## 2022-02-18 NOTE — Telephone Encounter (Signed)
Resent ? ?Thanks, ? ?Rich

## 2022-02-18 NOTE — Telephone Encounter (Signed)
Caller name:Taegan Resurreccion  ? ?On DPR? :Yes ? ?Call back [phone] ? ?Provider they see: Delfino Lovett ? ?Reason for call:Pt is calling the pharmacy and they are telling her they have never received the levofloxacin (LEVAQUIN) 250 MG tablet , guaiFENesin-dextromethorphan (ROBITUSSIN DM) 100-10 MG/5ML syrup [[id number]]  Pt is wanting to know if pharmacy can be called  ? ?

## 2022-02-18 NOTE — Telephone Encounter (Signed)
I called [facility] on [address] and spoke with Tamika.  Tamika stated the only Rxs that were received on 3/15 was for Doxycycline and Prednisone which the patient picked up.  She stated she does not see a prescription for Levaquin nor generic Robitussin DM, although Epic shows this was received electronically?  Message sent to PCP. ?

## 2022-02-18 NOTE — Telephone Encounter (Signed)
Patient informed of the message below.

## 2022-02-24 NOTE — Telephone Encounter (Signed)
Patient called in stating that the pharmacy still does not have the prescription Levaquin nor generic Robitussin DM. Patient asked if we can call the pharmacy to see what is going on.  ?

## 2022-02-26 NOTE — Telephone Encounter (Signed)
Attempted to call patient to see if she received the medication ?

## 2022-02-27 ENCOUNTER — Other Ambulatory Visit: Payer: Self-pay

## 2022-02-27 DIAGNOSIS — G8929 Other chronic pain: Secondary | ICD-10-CM

## 2022-02-27 MED ORDER — HYDROCODONE-ACETAMINOPHEN 5-300 MG PO TABS: 1.0000 | ORAL_TABLET | Freq: Two times a day (BID) | ORAL | 0 refills | Status: DC | PRN

## 2022-02-27 NOTE — Progress Notes (Signed)
Sent Thanks Rich

## 2022-03-27 ENCOUNTER — Ambulatory Visit: Payer: Medicare PPO | Admitting: Registered Nurse

## 2022-04-16 ENCOUNTER — Telehealth (HOSPITAL_COMMUNITY): Payer: Medicare PPO | Admitting: Psychiatry

## 2022-04-17 ENCOUNTER — Other Ambulatory Visit: Payer: Self-pay

## 2022-04-17 ENCOUNTER — Emergency Department (HOSPITAL_COMMUNITY)
Admission: EM | Admit: 2022-04-17 | Discharge: 2022-04-17 | Disposition: A | Discharge status: Home / Self Care | Payer: Medicare PPO | Attending: Emergency Medicine | Admitting: Emergency Medicine

## 2022-04-17 ENCOUNTER — Encounter (HOSPITAL_COMMUNITY): Payer: Self-pay | Admitting: Emergency Medicine

## 2022-04-17 DIAGNOSIS — K5900 Constipation, unspecified: Secondary | ICD-10-CM | POA: Diagnosis not present

## 2022-04-17 DIAGNOSIS — I10 Essential (primary) hypertension: Secondary | ICD-10-CM | POA: Insufficient documentation

## 2022-04-17 DIAGNOSIS — R1084 Generalized abdominal pain: Secondary | ICD-10-CM | POA: Insufficient documentation

## 2022-04-17 DIAGNOSIS — Z7951 Long term (current) use of inhaled steroids: Secondary | ICD-10-CM | POA: Diagnosis not present

## 2022-04-17 DIAGNOSIS — J449 Chronic obstructive pulmonary disease, unspecified: Secondary | ICD-10-CM | POA: Diagnosis not present

## 2022-04-17 DIAGNOSIS — Z79899 Other long term (current) drug therapy: Secondary | ICD-10-CM | POA: Diagnosis not present

## 2022-04-17 DIAGNOSIS — Z91040 Latex allergy status: Secondary | ICD-10-CM | POA: Insufficient documentation

## 2022-04-17 LAB — CBC WITH DIFFERENTIAL/PLATELET
Abs Immature Granulocytes: 0.09 10*3/uL — ABNORMAL HIGH (ref 0.00–0.07)
Basophils Absolute: 0 10*3/uL (ref 0.0–0.1)
Basophils Relative: 0 %
Eosinophils Absolute: 0.2 10*3/uL (ref 0.0–0.5)
Eosinophils Relative: 1 %
HCT: 45 % (ref 36.0–46.0)
Hemoglobin: 13.9 g/dL (ref 12.0–15.0)
Immature Granulocytes: 1 %
Lymphocytes Relative: 7 %
Lymphs Abs: 0.8 10*3/uL (ref 0.7–4.0)
MCH: 29.8 pg (ref 26.0–34.0)
MCHC: 30.9 g/dL (ref 30.0–36.0)
MCV: 96.6 fL (ref 80.0–100.0)
Monocytes Absolute: 0.5 10*3/uL (ref 0.1–1.0)
Monocytes Relative: 4 %
Neutro Abs: 10.2 10*3/uL — ABNORMAL HIGH (ref 1.7–7.7)
Neutrophils Relative %: 87 %
Platelets: 202 10*3/uL (ref 150–400)
RBC: 4.66 MIL/uL (ref 3.87–5.11)
RDW: 14.4 % (ref 11.5–15.5)
WBC: 11.7 10*3/uL — ABNORMAL HIGH (ref 4.0–10.5)
nRBC: 0 % (ref 0.0–0.2)

## 2022-04-17 LAB — COMPREHENSIVE METABOLIC PANEL
ALT: 18 U/L (ref 0–44)
AST: 20 U/L (ref 15–41)
Albumin: 3.2 g/dL — ABNORMAL LOW (ref 3.5–5.0)
Alkaline Phosphatase: 155 U/L — ABNORMAL HIGH (ref 38–126)
Anion gap: 7 (ref 5–15)
BUN: 10 mg/dL (ref 8–23)
CO2: 25 mmol/L (ref 22–32)
Calcium: 9.6 mg/dL (ref 8.9–10.3)
Chloride: 98 mmol/L (ref 98–111)
Creatinine, Ser: 0.61 mg/dL (ref 0.44–1.00)
GFR, Estimated: >60 mL/min (ref >60)
Glucose, Bld: 152 mg/dL — ABNORMAL HIGH (ref 70–99)
Potassium: 4.4 mmol/L (ref 3.5–5.1)
Sodium: 130 mmol/L — ABNORMAL LOW (ref 135–145)
Total Bilirubin: 0.7 mg/dL (ref 0.3–1.2)
Total Protein: 7.3 g/dL (ref 6.5–8.1)

## 2022-04-17 LAB — LIPASE, BLOOD: Lipase: 38 U/L (ref 11–51)

## 2022-04-17 NOTE — ED Triage Notes (Signed)
Patient here with complaint of constipation, states last bowel movement this passed Sunday. Patient states she has tried enemas and suppositories without relief. Patient is alert and oriented at this time.

## 2022-04-17 NOTE — ED Provider Triage Note (Signed)
Emergency Medicine Provider Triage Evaluation Note  Catherine Kelley , a 71 y.o. female  was evaluated in triage.  Pt complains of constipation, no bowel movement since 04/13/2022.  Reports generalized abdominal discomfort.  Denies any vomiting.  States that she does have a history of constipation from time to time however it is "never been this bad."  She has tried medications and enemas with out much improvement.  Denies history of bowel obstruction that she is aware of.  Review of Systems  Positive: Constipation Negative: Vomiting  Physical Exam  There were no vitals taken for this visit. Gen:   Awake, no distress   Resp:  Normal effort MSK:   Moves extremities without difficulty  Other:    Medical Decision Making  Medically screening exam initiated at 2:21 PM.  Appropriate orders placed.  Catherine Kelley was informed that the remainder of the evaluation will be completed by another provider, this initial triage assessment does not replace that evaluation, and the importance of remaining in the ED until their evaluation is complete.  Work-up initiated   Delia Heady, PA-C 04/17/22 1423

## 2022-04-17 NOTE — ED Provider Notes (Signed)
[facility] DEPARTMENT Provider Note   CSN: [id number] Arrival date & time: 04/17/22  1412     History  Chief Complaint  Patient presents with   Constipation    Catherine Kelley is a 71 y.o. female with a history of bipolar disorder, COPD, depression/anxiety, HTN presented to the ED with constipation.  Patient states that her last bowel movement was on Sunday and was largely normal.  She states that she has tried multiple enemas and glycerin suppositories without improvement.  She has also taken a oral laxative for 1 to 2 days without improvement.  She does endorse some diffuse abdominal distention and mild pain.  No nausea or vomiting.  Still able to tolerate p.o. intake.  She denies any fevers, chest pain, shortness of breath.  She states that she has had issues with constipation in the past, but they usually resolve with enemas or suppositories.   Constipation Associated symptoms: abdominal pain   Associated symptoms: no diarrhea, no dysuria, no fever, no nausea and no vomiting       Home Medications Prior to Admission medications   Medication Sig Start Date End Date Taking? Authorizing Provider  albuterol (VENTOLIN HFA) 108 (90 Base) MCG/ACT inhaler TAKE 2 PUFFS BY MOUTH EVERY 6 HOURS AS NEEDED FOR WHEEZE OR SHORTNESS OF BREATH 02/07/22   Maximiano Coss, NP  alendronate (FOSAMAX) 70 MG tablet Take 1 tablet (70 mg total) by mouth every 7 (seven) days. Take with a full glass of water on an empty stomach. Patient not taking: Reported on 01/15/2022 01/13/20   Wendall Mola, NP  amLODipine (NORVASC) 2.5 MG tablet TAKE 1 TABLET BY MOUTH EVERY DAY 07/06/20   Jacelyn Pi, Lilia Argue, MD  aspirin EC 81 MG tablet Take 81 mg by mouth daily as needed.  Patient not taking: Reported on 01/15/2022    [provider]  cetirizine (ZYRTEC) 10 MG tablet Take 1 tablet (10 mg total) by mouth daily. 02/15/21   Maximiano Coss, NP  doxycycline (VIBRA-TABS) 100 MG tablet  TAKE 1 TABLET BY MOUTH TWICE A DAY 02/17/22   Maximiano Coss, NP  fluticasone [facility]) 50 MCG/ACT nasal spray SPRAY 2 SPRAYS INTO EACH NOSTRIL EVERY DAY 11/09/20   Posey Boyer, MD  guaiFENesin-dextromethorphan (ROBITUSSIN DM) 100-10 MG/5ML syrup Take 5 mLs by mouth at bedtime as needed for cough. 02/18/22   Maximiano Coss, NP  HYDROcodone-Acetaminophen 5-300 MG TABS Take 1 tablet by mouth 2 (two) times daily as needed. 02/27/22   Maximiano Coss, NP  hydrOXYzine (VISTARIL) 25 MG capsule Take 1 capsule (25 mg total) by mouth at bedtime as needed for anxiety. Patient not taking: Reported on 01/15/2022 10/16/21   Arfeen, Arlyce Harman, MD  levofloxacin (LEVAQUIN) 250 MG tablet Take 1 tablet (250 mg total) by mouth daily. 02/18/22   Maximiano Coss, NP  lithium carbonate (LITHOBID) 300 MG CR tablet Take 1 tablet (300 mg total) by mouth 3 (three) times daily. 01/15/22 01/15/23  Arfeen, Arlyce Harman, MD  nortriptyline (PAMELOR) 50 MG capsule Take 1 capsule (50 mg total) by mouth at bedtime. 01/15/22   Arfeen, Arlyce Harman, MD  predniSONE (DELTASONE) 10 MG tablet TAKE 1 TABLET (10 MG TOTAL) BY MOUTH DAILY WITH BREAKFAST. 02/17/22   Maximiano Coss, NP  spironolactone (ALDACTONE) 50 MG tablet TAKE 1 TABLET BY MOUTH EVERY DAY 11/15/21   Maximiano Coss, NP  triamcinolone cream (KENALOG) 0.1 % Apply 1 application topically 2 (two) times daily. 10/18/19   Wendall Mola,  NP  venlafaxine (EFFEXOR) 75 MG tablet Take 1 tablet (75 mg total) by mouth daily. 01/15/22 01/15/23  Arfeen, Arlyce Harman, MD  Vitamin D, Ergocalciferol, (DRISDOL) 1.25 MG (50000 UNIT) CAPS capsule TAKE 1 CAPSULE (50,000 UNITS TOTAL) BY MOUTH EVERY 7 (SEVEN) DAYS 02/07/22   Maximiano Coss, NP      Allergies    Sulfa drugs cross reactors, Ceclor [cefaclor], Depakote er [divalproex sodium er], Escitalopram oxalate, Latex, Sertraline hcl, and Tape    Review of Systems   Review of Systems  Constitutional:  Negative for fever.  Respiratory:  Negative for cough and  shortness of breath.   Cardiovascular:  Negative for chest pain.  Gastrointestinal:  Positive for abdominal distention, abdominal pain and constipation. Negative for blood in stool, diarrhea, nausea and vomiting.  Genitourinary:  Negative for difficulty urinating and dysuria.  Neurological:  Negative for syncope.   Physical Exam Updated Vital Signs BP 116/85   Pulse 83   Resp 20   SpO2 94%  Physical Exam Vitals and nursing note reviewed.  Constitutional:      General: She is not in acute distress.    Appearance: She is not toxic-appearing or diaphoretic.     Comments: Elderly and chronically ill appearing.  HENT:     Head: Normocephalic and atraumatic.     Nose: Nose normal.     Mouth/Throat:     Mouth: Mucous membranes are moist.     Pharynx: Oropharynx is clear.  Eyes:     General: No scleral icterus. Cardiovascular:     Rate and Rhythm: Normal rate and regular rhythm.     Heart sounds: Normal heart sounds. No murmur heard.   No friction rub. No gallop.  Pulmonary:     Effort: Pulmonary effort is normal. No respiratory distress.     Breath sounds: No stridor. No wheezing or rales.     Comments: Mild diffuse expiratory rhonchi. Abdominal:     General: There is distension.     Tenderness: There is no abdominal tenderness. There is no guarding or rebound.  Genitourinary:    Comments: Large stool ball on rectal exam without gross blood. Musculoskeletal:        General: No deformity.     Cervical back: Neck supple.  Skin:    General: Skin is warm and dry.  Neurological:     General: No focal deficit present.     Mental Status: She is oriented to person, place, and time.    ED Results / Procedures / Treatments   Labs (all labs ordered are listed, but only abnormal results are displayed) Labs Reviewed  COMPREHENSIVE METABOLIC PANEL - Abnormal; Notable for the following components:      Result Value   Sodium 130 (*)    Glucose, Bld 152 (*)    Albumin 3.2 (*)     Alkaline Phosphatase 155 (*)    All other components within normal limits  CBC WITH DIFFERENTIAL/PLATELET - Abnormal; Notable for the following components:   WBC 11.7 (*)    Neutro Abs 10.2 (*)    Abs Immature Granulocytes 0.09 (*)    All other components within normal limits  LIPASE, BLOOD    EKG None  Radiology No results found.  Procedures Procedures    Medications Ordered in ED Medications - No data to display  ED Course/ Medical Decision Making/ A&P  Medical Decision Making   Catherine Kelley is a 71 y.o. female with a history of bipolar disorder, COPD, depression/anxiety, HTN presented to the ED with constipation.  On exam, the patient is afebrile and hemodynamically stable.  She does have diffuse abdominal distention but without focal tenderness or guarding.  Patient states she has been able to tolerate p.o. intake without any nausea or vomiting.  On rectal exam, patient does have a large stool ball without gross blood.  CMP with sodium of 130 which corrects to 131 in the setting of her mild hyperglycemia.  Patient does have a history of chronic hyponatremia this appears to be close to her baseline, though is slightly lower likely related to her constipation and mildly decreased p.o. intake.  CBC with mild leukocytosis with WBC of 11.7.  Hemoglobin is normal at 13.9.  Lipase is within normal limits.  Patient's presentation most consistent with constipation.  Low suspicion for infectious abdominal pathology such as diverticulitis, appendicitis, cholecystitis given patient does not have any focal tenderness or guarding on abdominal exam.  While she does have some abdominal distention, given that she does not have any associated peritonitis, I do not think that CT imaging is required at this time.  Patient was manually disimpacted at the bedside and administered a soap suds enema.  Patient did have some liquid stool output after the enema and states that  she overall feels improved and is requesting discharge.  I did recommend that she initiate MiraLAX and have also advised her that she can take senna and/or docusate as needed for constipation.  I have also recommended that she follow-up with her primary care doctor within the next week.  Patient verbalized understanding.  Strict return precautions were discussed and the patient was discharged home in stable condition.  Final Clinical Impression(s) / ED Diagnoses Final diagnoses:  Constipation, unspecified constipation type    Rx / DC Orders ED Discharge Orders     None         Sondra Come, MD 04/17/22 1729    Lajean Saver, MD 04/17/22 2057

## 2022-04-17 NOTE — Discharge Instructions (Signed)
Start Miralax, 1-3 capfuls daily (you can increase or decrease the number based on your bowel movements). You can also start senna and/or docusate (over the counter medicines) daily as needed for constipation. Please return if you have vomiting or severe abdominal pain.

## 2022-04-19 ENCOUNTER — Other Ambulatory Visit (HOSPITAL_COMMUNITY): Payer: Self-pay | Admitting: Psychiatry

## 2022-04-19 DIAGNOSIS — F319 Bipolar disorder, unspecified: Secondary | ICD-10-CM

## 2022-04-19 DIAGNOSIS — F419 Anxiety disorder, unspecified: Secondary | ICD-10-CM

## 2022-04-23 ENCOUNTER — Emergency Department (HOSPITAL_BASED_OUTPATIENT_CLINIC_OR_DEPARTMENT_OTHER): Payer: Medicare PPO | Admitting: Radiology

## 2022-04-23 ENCOUNTER — Emergency Department (HOSPITAL_BASED_OUTPATIENT_CLINIC_OR_DEPARTMENT_OTHER): Payer: Medicare PPO

## 2022-04-23 ENCOUNTER — Telehealth: Payer: Self-pay | Admitting: Registered Nurse

## 2022-04-23 ENCOUNTER — Ambulatory Visit: Payer: Medicare PPO | Admitting: Registered Nurse

## 2022-04-23 ENCOUNTER — Encounter (HOSPITAL_BASED_OUTPATIENT_CLINIC_OR_DEPARTMENT_OTHER): Payer: Self-pay | Admitting: Radiology

## 2022-04-23 ENCOUNTER — Other Ambulatory Visit: Payer: Self-pay

## 2022-04-23 ENCOUNTER — Telehealth: Payer: Self-pay | Admitting: Physician Assistant

## 2022-04-23 ENCOUNTER — Encounter: Payer: Self-pay | Admitting: Registered Nurse

## 2022-04-23 ENCOUNTER — Inpatient Hospital Stay (HOSPITAL_BASED_OUTPATIENT_CLINIC_OR_DEPARTMENT_OTHER)
Admission: EM | Admit: 2022-04-23 | Discharge: 2022-04-29 | DRG: 388 | Disposition: A | Discharge status: Home Health | Payer: Medicare PPO | Attending: Internal Medicine | Admitting: Internal Medicine

## 2022-04-23 VITALS — HR 80 | Temp 98.1°F | Resp 18 | Ht 63.0 in | Wt 101.6 lb

## 2022-04-23 DIAGNOSIS — Z825 Family history of asthma and other chronic lower respiratory diseases: Secondary | ICD-10-CM

## 2022-04-23 DIAGNOSIS — E785 Hyperlipidemia, unspecified: Secondary | ICD-10-CM | POA: Diagnosis present

## 2022-04-23 DIAGNOSIS — K5649 Other impaction of intestine: Secondary | ICD-10-CM

## 2022-04-23 DIAGNOSIS — K5641 Fecal impaction: Secondary | ICD-10-CM | POA: Diagnosis present

## 2022-04-23 DIAGNOSIS — M549 Dorsalgia, unspecified: Secondary | ICD-10-CM | POA: Diagnosis present

## 2022-04-23 DIAGNOSIS — R194 Change in bowel habit: Secondary | ICD-10-CM

## 2022-04-23 DIAGNOSIS — F319 Bipolar disorder, unspecified: Secondary | ICD-10-CM | POA: Diagnosis present

## 2022-04-23 DIAGNOSIS — I10 Essential (primary) hypertension: Secondary | ICD-10-CM | POA: Diagnosis present

## 2022-04-23 DIAGNOSIS — E871 Hypo-osmolality and hyponatremia: Secondary | ICD-10-CM | POA: Diagnosis not present

## 2022-04-23 DIAGNOSIS — H9190 Unspecified hearing loss, unspecified ear: Secondary | ICD-10-CM | POA: Diagnosis present

## 2022-04-23 DIAGNOSIS — K5520 Angiodysplasia of colon without hemorrhage: Secondary | ICD-10-CM | POA: Diagnosis present

## 2022-04-23 DIAGNOSIS — R935 Abnormal findings on diagnostic imaging of other abdominal regions, including retroperitoneum: Secondary | ICD-10-CM

## 2022-04-23 DIAGNOSIS — R918 Other nonspecific abnormal finding of lung field: Secondary | ICD-10-CM

## 2022-04-23 DIAGNOSIS — K5904 Chronic idiopathic constipation: Secondary | ICD-10-CM

## 2022-04-23 DIAGNOSIS — R051 Acute cough: Secondary | ICD-10-CM | POA: Diagnosis not present

## 2022-04-23 DIAGNOSIS — R112 Nausea with vomiting, unspecified: Secondary | ICD-10-CM

## 2022-04-23 DIAGNOSIS — N3289 Other specified disorders of bladder: Secondary | ICD-10-CM | POA: Diagnosis not present

## 2022-04-23 DIAGNOSIS — G8929 Other chronic pain: Secondary | ICD-10-CM | POA: Diagnosis present

## 2022-04-23 DIAGNOSIS — R748 Abnormal levels of other serum enzymes: Secondary | ICD-10-CM | POA: Diagnosis not present

## 2022-04-23 DIAGNOSIS — Z881 Allergy status to other antibiotic agents status: Secondary | ICD-10-CM

## 2022-04-23 DIAGNOSIS — K621 Rectal polyp: Secondary | ICD-10-CM | POA: Diagnosis present

## 2022-04-23 DIAGNOSIS — R339 Retention of urine, unspecified: Secondary | ICD-10-CM | POA: Diagnosis not present

## 2022-04-23 DIAGNOSIS — D696 Thrombocytopenia, unspecified: Secondary | ICD-10-CM | POA: Diagnosis not present

## 2022-04-23 DIAGNOSIS — K219 Gastro-esophageal reflux disease without esophagitis: Secondary | ICD-10-CM | POA: Diagnosis present

## 2022-04-23 DIAGNOSIS — R0902 Hypoxemia: Secondary | ICD-10-CM | POA: Diagnosis not present

## 2022-04-23 DIAGNOSIS — M545 Low back pain, unspecified: Secondary | ICD-10-CM | POA: Diagnosis not present

## 2022-04-23 DIAGNOSIS — T40605A Adverse effect of unspecified narcotics, initial encounter: Secondary | ICD-10-CM | POA: Diagnosis present

## 2022-04-23 DIAGNOSIS — Z681 Body mass index (BMI) 19 or less, adult: Secondary | ICD-10-CM | POA: Diagnosis not present

## 2022-04-23 DIAGNOSIS — M899 Disorder of bone, unspecified: Secondary | ICD-10-CM | POA: Diagnosis present

## 2022-04-23 DIAGNOSIS — R64 Cachexia: Secondary | ICD-10-CM | POA: Diagnosis present

## 2022-04-23 DIAGNOSIS — C7951 Secondary malignant neoplasm of bone: Secondary | ICD-10-CM

## 2022-04-23 DIAGNOSIS — E861 Hypovolemia: Secondary | ICD-10-CM | POA: Diagnosis present

## 2022-04-23 DIAGNOSIS — K5289 Other specified noninfective gastroenteritis and colitis: Secondary | ICD-10-CM | POA: Diagnosis present

## 2022-04-23 DIAGNOSIS — Z7982 Long term (current) use of aspirin: Secondary | ICD-10-CM

## 2022-04-23 DIAGNOSIS — Z823 Family history of stroke: Secondary | ICD-10-CM

## 2022-04-23 DIAGNOSIS — Z882 Allergy status to sulfonamides status: Secondary | ICD-10-CM

## 2022-04-23 DIAGNOSIS — R1084 Generalized abdominal pain: Secondary | ICD-10-CM

## 2022-04-23 DIAGNOSIS — K635 Polyp of colon: Secondary | ICD-10-CM | POA: Diagnosis not present

## 2022-04-23 DIAGNOSIS — F17210 Nicotine dependence, cigarettes, uncomplicated: Secondary | ICD-10-CM | POA: Diagnosis present

## 2022-04-23 DIAGNOSIS — Z8249 Family history of ischemic heart disease and other diseases of the circulatory system: Secondary | ICD-10-CM

## 2022-04-23 DIAGNOSIS — R933 Abnormal findings on diagnostic imaging of other parts of digestive tract: Secondary | ICD-10-CM | POA: Diagnosis not present

## 2022-04-23 DIAGNOSIS — Z888 Allergy status to other drugs, medicaments and biological substances status: Secondary | ICD-10-CM

## 2022-04-23 DIAGNOSIS — J479 Bronchiectasis, uncomplicated: Secondary | ICD-10-CM | POA: Diagnosis present

## 2022-04-23 DIAGNOSIS — K56609 Unspecified intestinal obstruction, unspecified as to partial versus complete obstruction: Secondary | ICD-10-CM | POA: Diagnosis not present

## 2022-04-23 DIAGNOSIS — K5900 Constipation, unspecified: Secondary | ICD-10-CM | POA: Diagnosis not present

## 2022-04-23 DIAGNOSIS — E222 Syndrome of inappropriate secretion of antidiuretic hormone: Secondary | ICD-10-CM | POA: Diagnosis present

## 2022-04-23 DIAGNOSIS — M810 Age-related osteoporosis without current pathological fracture: Secondary | ICD-10-CM | POA: Diagnosis present

## 2022-04-23 DIAGNOSIS — K6289 Other specified diseases of anus and rectum: Secondary | ICD-10-CM | POA: Diagnosis not present

## 2022-04-23 DIAGNOSIS — R54 Age-related physical debility: Secondary | ICD-10-CM | POA: Diagnosis present

## 2022-04-23 DIAGNOSIS — J449 Chronic obstructive pulmonary disease, unspecified: Secondary | ICD-10-CM | POA: Diagnosis not present

## 2022-04-23 DIAGNOSIS — R634 Abnormal weight loss: Secondary | ICD-10-CM | POA: Diagnosis not present

## 2022-04-23 DIAGNOSIS — J432 Centrilobular emphysema: Secondary | ICD-10-CM | POA: Diagnosis present

## 2022-04-23 DIAGNOSIS — R059 Cough, unspecified: Secondary | ICD-10-CM | POA: Diagnosis not present

## 2022-04-23 DIAGNOSIS — K5909 Other constipation: Secondary | ICD-10-CM

## 2022-04-23 DIAGNOSIS — K529 Noninfective gastroenteritis and colitis, unspecified: Secondary | ICD-10-CM | POA: Diagnosis present

## 2022-04-23 DIAGNOSIS — Z79899 Other long term (current) drug therapy: Secondary | ICD-10-CM

## 2022-04-23 DIAGNOSIS — E162 Hypoglycemia, unspecified: Secondary | ICD-10-CM | POA: Diagnosis not present

## 2022-04-23 DIAGNOSIS — J690 Pneumonitis due to inhalation of food and vomit: Secondary | ICD-10-CM | POA: Diagnosis not present

## 2022-04-23 DIAGNOSIS — Z7983 Long term (current) use of bisphosphonates: Secondary | ICD-10-CM

## 2022-04-23 DIAGNOSIS — Z803 Family history of malignant neoplasm of breast: Secondary | ICD-10-CM

## 2022-04-23 DIAGNOSIS — Z8349 Family history of other endocrine, nutritional and metabolic diseases: Secondary | ICD-10-CM

## 2022-04-23 DIAGNOSIS — J90 Pleural effusion, not elsewhere classified: Secondary | ICD-10-CM | POA: Diagnosis not present

## 2022-04-23 DIAGNOSIS — Z91040 Latex allergy status: Secondary | ICD-10-CM

## 2022-04-23 DIAGNOSIS — Z7952 Long term (current) use of systemic steroids: Secondary | ICD-10-CM

## 2022-04-23 LAB — CBC WITH DIFFERENTIAL/PLATELET
Abs Immature Granulocytes: 0.08 10*3/uL — ABNORMAL HIGH (ref 0.00–0.07)
Basophils Absolute: 0 10*3/uL (ref 0.0–0.1)
Basophils Relative: 0 %
Eosinophils Absolute: 0.2 10*3/uL (ref 0.0–0.5)
Eosinophils Relative: 2 %
HCT: 44.5 % (ref 36.0–46.0)
Hemoglobin: 14 g/dL (ref 12.0–15.0)
Immature Granulocytes: 1 %
Lymphocytes Relative: 9 %
Lymphs Abs: 1 10*3/uL (ref 0.7–4.0)
MCH: 29.6 pg (ref 26.0–34.0)
MCHC: 31.5 g/dL (ref 30.0–36.0)
MCV: 94.1 fL (ref 80.0–100.0)
Monocytes Absolute: 0.5 10*3/uL (ref 0.1–1.0)
Monocytes Relative: 4 %
Neutro Abs: 9 10*3/uL — ABNORMAL HIGH (ref 1.7–7.7)
Neutrophils Relative %: 84 %
Platelets: 187 10*3/uL (ref 150–400)
RBC: 4.73 MIL/uL (ref 3.87–5.11)
RDW: 14.3 % (ref 11.5–15.5)
WBC: 10.7 10*3/uL — ABNORMAL HIGH (ref 4.0–10.5)
nRBC: 0 % (ref 0.0–0.2)

## 2022-04-23 LAB — COMPREHENSIVE METABOLIC PANEL
ALT: 22 U/L (ref 0–44)
AST: 28 U/L (ref 15–41)
Albumin: 3.7 g/dL (ref 3.5–5.0)
Alkaline Phosphatase: 209 U/L — ABNORMAL HIGH (ref 38–126)
Anion gap: 8 (ref 5–15)
BUN: 9 mg/dL (ref 8–23)
CO2: 24 mmol/L (ref 22–32)
Calcium: 10.1 mg/dL (ref 8.9–10.3)
Chloride: 95 mmol/L — ABNORMAL LOW (ref 98–111)
Creatinine, Ser: 0.57 mg/dL (ref 0.44–1.00)
GFR, Estimated: >60 mL/min (ref >60)
Glucose, Bld: 86 mg/dL (ref 70–99)
Potassium: 4.6 mmol/L (ref 3.5–5.1)
Sodium: 127 mmol/L — ABNORMAL LOW (ref 135–145)
Total Bilirubin: 0.6 mg/dL (ref 0.3–1.2)
Total Protein: 7.6 g/dL (ref 6.5–8.1)

## 2022-04-23 LAB — LIPASE, BLOOD: Lipase: 29 U/L (ref 11–51)

## 2022-04-23 MED ORDER — IOHEXOL 300 MG/ML  SOLN
100.0000 mL | Freq: Once | INTRAMUSCULAR | Status: AC | PRN
  Administered 2022-04-23: 60 mL via INTRAVENOUS

## 2022-04-23 NOTE — Telephone Encounter (Signed)
Patient is in office for an appointment today.

## 2022-04-23 NOTE — ED Triage Notes (Signed)
Pt c/o constipation x 1 week. Was seen at [facility] on 05/18 for same and had fecal disimpaction per pt, no BM since. Feels the urge but "stool is hard and cannot come out". H/O Chronic constipation and take enema at home. Denies nausea, vomiting.

## 2022-04-23 NOTE — Patient Instructions (Addendum)
Ms Catherine Kelley to see you, I am so sorry you're going through this -   I want to have you go to [facility] - they are the location down the road from [name] on [address] you [address] towards [place]. They will be able to disimpact your stool. Continue stool softeners. They will also be able to get labs sooner than we can - with how you are feeling, I worry that your electrolytes (sodium, potassium, etc) are imbalanced.   I want to set you up with a gastroenterology follow up as soon as we can after the ER. This should be able to happen within the next 2 weeks.  I want to see you on Tuesday morning to see how you are feeling.  Thank you,  Rich     If you have lab work done today you will be contacted with your lab results within the next 2 weeks.  If you have not heard from [name] then please contact us. The fastest way to get your results is to register for My Chart.   IF you received an x-ray today, you will receive an invoice from [facility]. Please contact [facility] at [phone] with questions or concerns regarding your invoice.   IF you received labwork today, you will receive an invoice from [place]. Please contact [facility] at [phone] with questions or concerns regarding your invoice.   Our billing staff will not be able to assist you with questions regarding bills from these companies.  You will be contacted with the lab results as soon as they are available. The fastest way to get your results is to activate your My Chart account. Instructions are located on the last page of this paperwork. If you have not heard from [name] regarding the results in 2 weeks, please contact this office.

## 2022-04-23 NOTE — Telephone Encounter (Signed)
We received a call from [place] at [facility] regarding an emergent referral. This patient has chronic idiopathic constipation, impaction, and generalized abdominal pain.  Next available with an APP 05/16/22. Doctor wants this patient scheduled in a week because she can't wait that long. Do you have anything openings?

## 2022-04-23 NOTE — Telephone Encounter (Signed)
Chief Complaint Constipation Reason for Call Symptomatic / Request for [place] states pt has been having constipation issues for about a month. She went to ED last Thursday and they cleared it out but she hasn't had a bowel movement since. She has used enemas and suppositories Translation No Nurse Assessment Nurse: Eugenio Hoes, RN, Jenny Reichmann Date/Time (Eastern Time): 04/22/2022 3:44:26 PM Confirm and document reason for call. If symptomatic, describe symptoms. ---Caller states that she has had constipation since Thursday and has tried suppositories and enemas but nothing has helped. Does the patient have any new or worsening symptoms? ---Yes Will a triage be completed? ---Yes Related visit to physician within the last 2 weeks? ---Yes Does the PT have any chronic conditions? (i.e. diabetes, asthma, this includes High risk factors for pregnancy, etc.) ---Yes List chronic conditions. ---asthma Is this a behavioral health or substance abuse call? ---No Guidelines Guideline Title Affirmed Question Affirmed Notes Nurse Date/Time (Eastern Time) Constipation [1] Vomiting AND [2] abdomen looks much more swollen than usual Lynett Fish 04/22/2022 3:46:58 PM PLEASE NOTE: All timestamps contained within this report are represented as [place] Standard Time. CONFIDENTIALTY NOTICE: This fax transmission is intended only for the addressee. It contains information that is legally privileged, confidential or otherwise protected from use or disclosure. If you are not the intended recipient, you are strictly prohibited from reviewing, disclosing, copying using or disseminating any of this information or taking any action in reliance on or regarding this information. If you have received this fax in error, please notify us immediately by telephone so that we can arrange for its return to [name]. Phone: [phone], Toll-Free: [phone], Fax: [phone] Page: 2 of 2 Call  Id: [id number] [place]. Time Eilene Ghazi Time) Disposition Final User 04/22/2022 3:51:34 PM See HCP within 4 Hours (or PCP triage) Yes Eugenio Hoes, RN, [place] Disagree/Comply Disagree Caller Understands Yes PreDisposition Did not know what to do Care Advice Given Per Guideline SEE HCP (OR PCP TRIAGE) WITHIN 4 HOURS: * IF OFFICE WILL BE OPEN: You need to be seen within the next 3 or 4 hours. Call your doctor (or NP/PA) now or as soon as the office opens. CALL BACK IF: * You become worse CARE ADVICE given per Constipation (Adult) guideline. Comments User: Baird Cancer, RN Date/Time Eilene Ghazi Time): 04/22/2022 3:52:13 PM Caller states that she will try to be seen tomorrow at her doctor's office. Referrals GO TO FACILITY UNDECIDED

## 2022-04-23 NOTE — ED Provider Notes (Signed)
[facility] DE[facility] Provider Note   CSN: [id number] Arrival date & time: 04/23/22  1430     History Chief Complaint  Patient presents with   Constipation    Catherine Kelley is a 71 y.o. female with h/o bipolar 1, tobacco use, emphysema COPD, hypertension, hyponatremia, bronchiectasis presents to the emergency department for evaluation of constipation x1 week.  Patient reports that she has tried multiple laxatives and stool softeners and suppositories and enemas without any production of a bowel movement.  She was recently seen last week for the same problem in the emergency department and was able to have a bowel movement after digital disimpaction and enema.  She reports she has some mild abdominal pain diffusely but denies any nausea or vomiting.  She still able to eat.  She denies any chest pain, shortness of breath, fevers, dysuria, or hematuria.   Constipation Associated symptoms: abdominal pain   Associated symptoms: no back pain, no dysuria and no fever       Home Medications Prior to Admission medications   Medication Sig Start Date End Date Taking? Authorizing Provider  albuterol (VENTOLIN HFA) 108 (90 Base) MCG/ACT inhaler TAKE 2 PUFFS BY MOUTH EVERY 6 HOURS AS NEEDED FOR WHEEZE OR SHORTNESS OF BREATH 02/07/22   Maximiano Coss, NP  alendronate (FOSAMAX) 70 MG tablet Take 1 tablet (70 mg total) by mouth every 7 (seven) days. Take with a full glass of water on an empty stomach. Patient not taking: Reported on 01/15/2022 01/13/20   Wendall Mola, NP  amLODipine (NORVASC) 2.5 MG tablet TAKE 1 TABLET BY MOUTH EVERY DAY 07/06/20   Jacelyn Pi, Lilia Argue, MD  aspirin EC 81 MG tablet Take 81 mg by mouth daily as needed.  Patient not taking: Reported on 01/15/2022    [provider]  cetirizine (ZYRTEC) 10 MG tablet Take 1 tablet (10 mg total) by mouth daily. 02/15/21   Maximiano Coss, NP  doxycycline (VIBRA-TABS) 100 MG tablet TAKE 1 TABLET BY MOUTH TWICE  A DAY 02/17/22   Maximiano Coss, NP  fluticasone [facility]) 50 MCG/ACT nasal spray SPRAY 2 SPRAYS INTO EACH NOSTRIL EVERY DAY 11/09/20   Posey Boyer, MD  guaiFENesin-dextromethorphan (ROBITUSSIN DM) 100-10 MG/5ML syrup Take 5 mLs by mouth at bedtime as needed for cough. 02/18/22   Maximiano Coss, NP  HYDROcodone-Acetaminophen 5-300 MG TABS Take 1 tablet by mouth 2 (two) times daily as needed. 02/27/22   Maximiano Coss, NP  hydrOXYzine (VISTARIL) 25 MG capsule Take 1 capsule (25 mg total) by mouth at bedtime as needed for anxiety. Patient not taking: Reported on 01/15/2022 10/16/21   Arfeen, Arlyce Harman, MD  levofloxacin (LEVAQUIN) 250 MG tablet Take 1 tablet (250 mg total) by mouth daily. 02/18/22   Maximiano Coss, NP  lithium carbonate (LITHOBID) 300 MG CR tablet Take 1 tablet (300 mg total) by mouth 3 (three) times daily. 01/15/22 01/15/23  Arfeen, Arlyce Harman, MD  nortriptyline (PAMELOR) 50 MG capsule Take 1 capsule (50 mg total) by mouth at bedtime. 01/15/22   Arfeen, Arlyce Harman, MD  predniSONE (DELTASONE) 10 MG tablet TAKE 1 TABLET (10 MG TOTAL) BY MOUTH DAILY WITH BREAKFAST. 02/17/22   Maximiano Coss, NP  spironolactone (ALDACTONE) 50 MG tablet TAKE 1 TABLET BY MOUTH EVERY DAY 11/15/21   Maximiano Coss, NP  triamcinolone cream (KENALOG) 0.1 % Apply 1 application topically 2 (two) times daily. 10/18/19   Wendall Mola, NP  venlafaxine (EFFEXOR) 75 MG tablet Take 1 tablet (75  mg total) by mouth daily. 01/15/22 01/15/23  Arfeen, Arlyce Harman, MD  Vitamin D, Ergocalciferol, (DRISDOL) 1.25 MG (50000 UNIT) CAPS capsule TAKE 1 CAPSULE (50,000 UNITS TOTAL) BY MOUTH EVERY 7 (SEVEN) DAYS 02/07/22   Maximiano Coss, NP      Allergies    Sulfa drugs cross reactors, Ceclor [cefaclor], Depakote er [divalproex sodium er], Escitalopram oxalate, Latex, Sertraline hcl, and Tape    Review of Systems   Review of Systems  Constitutional:  Negative for chills and fever.  Respiratory:  Negative for shortness of breath.    Cardiovascular:  Negative for chest pain.  Gastrointestinal:  Positive for abdominal distention, abdominal pain and constipation.  Genitourinary:  Negative for dysuria and hematuria.  Musculoskeletal:  Negative for back pain.   Physical Exam Updated Vital Signs BP 137/60   Pulse 84   Temp 98.3 F (36.8 C) (Oral)   Resp 16   Ht '5\' 3"'  (1.6 m)   Wt 45.8 kg   SpO2 94%   BMI 17.89 kg/m  Physical Exam Vitals and nursing note reviewed.  Constitutional:      Comments: Chronically ill-appearing, cachectic  HENT:     Mouth/Throat:     Mouth: Mucous membranes are moist.     Comments: adentulous Eyes:     General: No scleral icterus. Cardiovascular:     Rate and Rhythm: Normal rate and regular rhythm.  Pulmonary:     Effort: Pulmonary effort is normal. No respiratory distress.  Abdominal:     General: Bowel sounds are normal. There is distension.     Tenderness: There is abdominal tenderness. There is no guarding or rebound.     Comments: Some abdominal distention noted, but abdomen still palpable with mild diffuse tenderness without guarding or rebound.  Skin:    General: Skin is dry.     Findings: No rash.  Neurological:     General: No focal deficit present.     Mental Status: She is alert. Mental status is at baseline.  Psychiatric:        Mood and Affect: Mood normal.    ED Results / Procedures / Treatments   Labs (all labs ordered are listed, but only abnormal results are displayed) Labs Reviewed  CBC WITH DIFFERENTIAL/PLATELET - Abnormal; Notable for the following components:      Result Value   WBC 10.7 (*)    Neutro Abs 9.0 (*)    Abs Immature Granulocytes 0.08 (*)    All other components within normal limits  COMPREHENSIVE METABOLIC PANEL - Abnormal; Notable for the following components:   Sodium 127 (*)    Chloride 95 (*)    Alkaline Phosphatase 209 (*)    All other components within normal limits  LIPASE, BLOOD    EKG None  Radiology DG Abdomen 1  View  Result Date: 04/23/2022 CLINICAL DATA:  Constipation. EXAM: ABDOMEN - 1 VIEW COMPARISON:  None Available. FINDINGS: A moderate amount of stool is present in the rectum and distal colon. There is mild gaseous distension of the transverse colon. No small bowel dilatation is seen to suggest obstruction. No acute osseous abnormality is evident. IMPRESSION: Moderate amount of stool in the distal colon and rectum. No evidence of bowel obstruction. Electronically Signed   By: Logan Bores M.D.   On: 04/23/2022 18:58   CT ABDOMEN PELVIS W CONTRAST  Result Date: 04/23/2022 CLINICAL DATA:  Constipation, bowel obstruction EXAM: CT ABDOMEN AND PELVIS WITH CONTRAST TECHNIQUE: Multidetector CT imaging of the abdomen and  pelvis was performed using the standard protocol following bolus administration of intravenous contrast. RADIATION DOSE REDUCTION: This exam was performed according to the departmental dose-optimization program which includes automated exposure control, adjustment of the mA and/or kV according to patient size and/or use of iterative reconstruction technique. CONTRAST:  99m OMNIPAQUE IOHEXOL 300 MG/ML  SOLN COMPARISON:  CT chest 11/08/2020 FINDINGS: Lower chest: There is severe varicoid bronchiectasis, asymmetrically more severe within the right lung base, similar to that noted on prior examination. This may reflect the sequela of recurrent aspiration, mucociliary disorders, or remote and/or recurrent infection. There is interval development of numerous air-fluid levels within the bronchiectatic airways of the visualized right lung base and peribronchial nodularity within the left lower lobe which may reflect changes of superimposed acute infection or aspiration. Spiculated opacities within the peripheral right lung base appears stable since prior examination and are most in keeping with post inflammatory scarring. Cardiac size within normal limits. Hepatobiliary: No focal liver abnormality is seen. No  gallstones, gallbladder wall thickening, or biliary dilatation. Pancreas: Unremarkable Spleen: Unremarkable Adrenals/Urinary Tract: Adrenal glands are unremarkable. Kidneys are normal, without renal calculi, focal lesion, or hydronephrosis. Bladder is unremarkable. Stomach/Bowel: Large volume stool seen within the rectal vault. Mild perirectal edema may reflect changes of superimposed stercoral proctitis. Moderate stool seen throughout the remainder of the colon. Stomach, small bowel, and large bowel are otherwise unremarkable. Appendix normal. No free intraperitoneal gas or fluid. Vascular/Lymphatic: Extensive aortoiliac atherosclerotic calcification. No aortic aneurysm. No pathologic adenopathy within the abdomen and pelvis Reproductive: Uterus and bilateral adnexa are unremarkable. Other: No abdominal wall hernia Musculoskeletal: Sclerotic lesions are seen within the T11, T12, L1, and L2 vertebral bodies concerning for metastatic disease. Lesion within the T11 vertebral body appears new since prior examination and T12 vertebral body has progressed. IMPRESSION: Severe asymmetric varicoid bronchiectasis within the visualized lung bases. See discussion above. Interval development of peribronchial nodularity within the left lung base and numerous air-fluid levels within the bronchiectatic airways of the right lung base in keeping with superimposed infection or aspiration. Stable spiculated opacities within the right lung base most in keeping with post inflammatory change. Large volume stool within the rectal vault with perirectal edema suggesting changes of stercoral proctitis. Superimposed moderate stool burden throughout the colon. Progressive sclerotic lesions within the visualized thoracolumbar spine suspicious for progressive osseous metastatic disease. Correlation for history of primary malignancy is recommended. Aortic Atherosclerosis (ICD10-I70.0). Electronically Signed   By: AFidela SalisburyM.D.   On:  04/23/2022 20:19    Procedures Procedures   Medications Ordered in ED Medications  iohexol (OMNIPAQUE) 300 MG/ML solution 100 mL (60 mLs Intravenous Contrast Given 04/23/22 1946)    ED Course/ Medical Decision Making/ A&P                           Medical Decision Making Amount and/or Complexity of Data Reviewed Labs: ordered. Radiology: ordered.  Risk Prescription drug management. Decision regarding hospitalization.    71year old female presents the emergency department for evaluation of constipation for the past 6 days.  Differential diagnosis includes is not limited to dehydration, constipation, small bowel obstruction, neurologic dysfunction.  Vital signs are unremarkable.  Patient normotensive, afebrile, normal pulse rate, satting 92% on room air without any increased work of breathing.  Physical exam is pertinent for some abdominal distention noted, but abdomen still palpable with mild diffuse tenderness without guarding or rebound.  Chronically ill-appearing, cachectic.  Given that the patient was  just seen here a week ago for constipation, will order CT scan to rule out any space-occupying lesion, malignancy, or small bowel obstruction.  I independently reviewed and interpreted the patient's labs and imaging and agree with radiologist interpretation.  CBC shows mild leukocytosis at 10.7 although is improved from patient's 11.71-week ago.  No anemia.  CMP shows worsening hyponatremia at 127.  Mild decreased chloride at 95.  Elevated alk phos at 209 which is increased from previous.  Normal lipase.  Plain film of the abdomen showed moderate amount of stool in the distal colon/rectum.  CT abdomen shows:  severe asymmetric varicoid bronchiectasis within the visualized lung bases. See discussion above. Interval development of peribronchial nodularity within the left lung base and numerous air-fluid levels within the bronchiectatic airways of the right lung base in keeping with  superimposed infection or aspiration. Stable spiculated opacities within the right lung base most in keeping with post inflammatory change. Large volume stool within the rectal vault with perirectal edema suggesting changes of stercoral proctitis. Superimposed moderate stool burden throughout the colon. Progressive sclerotic lesions within the visualized thoracolumbar spine suspicious for progressive osseous metastatic disease. Correlation for history of primary malignancy is recommended.  Given the concern for the stair coral proctitis, will put out page to general surgery.  The patient denies any history of cancer.  I do not see any history of cancer on her past medical history form.  I sent the patient has history of bronchiectasis in her problem list, but concern for the spiculated opacities in the right lung base as well as this progressive sclerotic lesions within the visualized thoracolumbar suspicion for progressive osteo metastatic disease.  I am concerned that the patient may have some malignancy in her spine that is causing her to have some neural dysfunction causing her constipation.  9:08 PM [place] with Dr. Zenia Resides with general surgery who reviewed the patient's CT scan. Dr. Zenia Resides reports that she will need disimpaction, otherwise does not see any need for surgical intervention or antibiotics at this time.   Digital disimpaction attempted with very minimal success.  Large stool ball felt in the rectal vault.  Small external hemorrhoid visualized at the 12 o'clock position.  Clay colored stool.  No bleeding noted.  Will order soapsuds enema for any success.  Initially, the patient was against admission, but is now more amenable to being admitted.  I discussed with her husband as well.  They are in grants to be transferred either [facility] or [place] for further evaluation.  The patient will need to be admitted for hyponatremia as well as for further investigation of her chronic constipation as well as  possible progressive osseous metastatic disease to her spine which will need further investigation with MRIs.  At this time, will handoff to Dr. Florina Ou to discuss with the hospitalist team for admission.  I discussed this case with my attending physician who cosigned this note including patient's presenting symptoms, physical exam, and planned diagnostics and interventions. Attending physician stated agreement with plan or made changes to plan which were implemented.   Final Clinical Impression(s) / ED Diagnoses Final diagnoses:  Hyponatremia  Chronic constipation  Abnormal CT of the abdomen    Rx / DC Orders ED Discharge Orders     None         Sherrell Puller, PA-C 04/24/22 2347    Tegeler, Gwenyth Allegra, MD 04/25/22 [phone]

## 2022-04-23 NOTE — Progress Notes (Signed)
Acute Office Visit  Subjective:    Patient ID: Catherine Kelley, female    DOB: 05-19-1951, 71 y.o.   MRN: [id number]  Chief Complaint  Patient presents with   Constipation    Patient states she went to the hospital last Thursday to get bowel cleared. Patient still has not made a bowel movement since then as well. She can barely walk and have been taking laxatives and suppositories but nothing is working    HPI Patient is in today for constipation  Exacerbation of chronic concern.  Has used laxatives and enemas in the past with good effect as needed, but starting last Sunday 04/13/22 did not have BM for four days.  Went to ER with abdominal bloating and pain on Thursday 04/17/22  Was disimpacted with bedside soapsuds enema.  Cbc cmp unremarkable except for near baseline hyponatermia likely marginally worsened by poor po intake   Has not had a bowel movement since. She has abdominal pain and feels weak. Having a tough time walking due to weakness. Has been continuing to take laxatives and suppositories without relief.   Her weight is down about 10-12 lbs from previous baseline, around 8% in past few months.   Last PO intake this morning around 1100 small portion - small piece of sausage, some bread.  Endorses good water intake. Normal urine voiding.   Outpatient Medications Prior to Visit  Medication Sig Dispense Refill   albuterol (VENTOLIN HFA) 108 (90 Base) MCG/ACT inhaler TAKE 2 PUFFS BY MOUTH EVERY 6 HOURS AS NEEDED FOR WHEEZE OR SHORTNESS OF BREATH 18 each 0   amLODipine (NORVASC) 2.5 MG tablet TAKE 1 TABLET BY MOUTH EVERY DAY 90 tablet 1   cetirizine (ZYRTEC) 10 MG tablet Take 1 tablet (10 mg total) by mouth daily. 30 tablet 11   doxycycline (VIBRA-TABS) 100 MG tablet TAKE 1 TABLET BY MOUTH TWICE A DAY 20 tablet 0   fluticasone (FLONASE) 50 MCG/ACT nasal spray SPRAY 2 SPRAYS INTO EACH NOSTRIL EVERY DAY 48 mL 2   guaiFENesin-dextromethorphan (ROBITUSSIN DM) 100-10 MG/5ML syrup Take  5 mLs by mouth at bedtime as needed for cough. 118 mL 0   HYDROcodone-Acetaminophen 5-300 MG TABS Take 1 tablet by mouth 2 (two) times daily as needed. 30 tablet 0   levofloxacin (LEVAQUIN) 250 MG tablet Take 1 tablet (250 mg total) by mouth daily. 7 tablet 0   lithium carbonate (LITHOBID) 300 MG CR tablet Take 1 tablet (300 mg total) by mouth 3 (three) times daily. 270 tablet 0   nortriptyline (PAMELOR) 50 MG capsule Take 1 capsule (50 mg total) by mouth at bedtime. 90 capsule 0   predniSONE (DELTASONE) 10 MG tablet TAKE 1 TABLET (10 MG TOTAL) BY MOUTH DAILY WITH BREAKFAST. 5 tablet 0   spironolactone (ALDACTONE) 50 MG tablet TAKE 1 TABLET BY MOUTH EVERY DAY 90 tablet 1   triamcinolone cream (KENALOG) 0.1 % Apply 1 application topically 2 (two) times daily. 30 g 1   venlafaxine (EFFEXOR) 75 MG tablet Take 1 tablet (75 mg total) by mouth daily. 90 tablet 0   Vitamin D, Ergocalciferol, (DRISDOL) 1.25 MG (50000 UNIT) CAPS capsule TAKE 1 CAPSULE (50,000 UNITS TOTAL) BY MOUTH EVERY 7 (SEVEN) DAYS 12 capsule 0   alendronate (FOSAMAX) 70 MG tablet Take 1 tablet (70 mg total) by mouth every 7 (seven) days. Take with a full glass of water on an empty stomach. (Patient not taking: Reported on 01/15/2022) 4 tablet 11   aspirin EC 81 MG  tablet Take 81 mg by mouth daily as needed.  (Patient not taking: Reported on 01/15/2022)     hydrOXYzine (VISTARIL) 25 MG capsule Take 1 capsule (25 mg total) by mouth at bedtime as needed for anxiety. (Patient not taking: Reported on 01/15/2022) 90 capsule 0   No facility-administered medications prior to visit.    Review of Systems  Constitutional:  Positive for appetite change.  HENT: Negative.    Eyes: Negative.   Respiratory: Negative.    Cardiovascular: Negative.   Gastrointestinal:  Positive for abdominal distention, abdominal pain, constipation and nausea. Negative for anal bleeding, blood in stool, diarrhea, rectal pain and vomiting.  Endocrine: Negative.    Genitourinary: Negative.   Musculoskeletal: Negative.   Skin: Negative.   Allergic/Immunologic: Negative.   Neurological: Negative.   Hematological: Negative.   Psychiatric/Behavioral: Negative.    All other systems reviewed and are negative.     Objective:    Pulse 80   Temp 98.1 F (36.7 C) (Temporal)   Resp 18   Ht 5\' 3"  (1.6 m)   SpO2 96%   BMI 17.89 kg/m  Physical Exam Vitals and nursing note reviewed.  Constitutional:      General: She is in acute distress.     Appearance: Normal appearance. She is normal weight. She is ill-appearing. She is not toxic-appearing or diaphoretic.  Cardiovascular:     Rate and Rhythm: Normal rate and regular rhythm.     Heart sounds: Normal heart sounds. No murmur heard.   No friction rub. No gallop.  Pulmonary:     Effort: Pulmonary effort is normal. No respiratory distress.     Breath sounds: Normal breath sounds. No stridor. No wheezing, rhonchi or rales.  Chest:     Chest wall: No tenderness.  Abdominal:     General: Bowel sounds are normal. There is distension.     Palpations: Abdomen is soft. There is no mass.     Tenderness: There is abdominal tenderness (generalized, worst in LLQ and suprapubic). There is guarding. There is no right CVA tenderness, left CVA tenderness or rebound.     Hernia: No hernia is present.  Skin:    General: Skin is warm and dry.  Neurological:     General: No focal deficit present.     Mental Status: She is alert and oriented to person, place, and time. Mental status is at baseline.  Psychiatric:        Mood and Affect: Mood normal.        Behavior: Behavior normal.        Thought Content: Thought content normal.        Judgment: Judgment normal.    No results found for any visits on 04/23/22.      Assessment & Plan:  1. Chronic idiopathic constipation - Ambulatory referral to Gastroenterology  2. Impaction, bowel ([place]) - Ambulatory referral to Gastroenterology  3. Generalized  abdominal pain - Ambulatory referral to Gastroenterology    No orders of the defined types were placed in this encounter.   Return in 6 days (on 04/29/2022) for Tuesday AM appt for ER follow up .  PLAN Pt referred to ER at [facility] for disimpaction, labs, and imaging. May need fluids if hyponatremia has worsened. May warrant imaging given persistent symptoms and weight loss. Will place urgent referral to GI.  Patient encouraged to call clinic with any questions, comments, or concerns.   Maximiano Coss, NP

## 2022-04-23 NOTE — Telephone Encounter (Signed)
Caryl Pina, I have scheduled the patient for a new pt appt with Ellouise Newer, PA-C on Friday, 05/02/22 at 1:30 pm. Please let Danae Chen know about the appt. Thanks

## 2022-04-23 NOTE — ED Notes (Signed)
Pt transported to CT ?

## 2022-04-24 ENCOUNTER — Observation Stay (HOSPITAL_COMMUNITY): Payer: Medicare PPO

## 2022-04-24 ENCOUNTER — Telehealth: Payer: Self-pay | Admitting: Pulmonary Disease

## 2022-04-24 DIAGNOSIS — E871 Hypo-osmolality and hyponatremia: Secondary | ICD-10-CM | POA: Diagnosis not present

## 2022-04-24 DIAGNOSIS — J479 Bronchiectasis, uncomplicated: Secondary | ICD-10-CM | POA: Diagnosis not present

## 2022-04-24 DIAGNOSIS — M899 Disorder of bone, unspecified: Secondary | ICD-10-CM | POA: Diagnosis present

## 2022-04-24 DIAGNOSIS — R918 Other nonspecific abnormal finding of lung field: Secondary | ICD-10-CM

## 2022-04-24 DIAGNOSIS — K5909 Other constipation: Secondary | ICD-10-CM

## 2022-04-24 DIAGNOSIS — K6289 Other specified diseases of anus and rectum: Secondary | ICD-10-CM | POA: Diagnosis not present

## 2022-04-24 DIAGNOSIS — K529 Noninfective gastroenteritis and colitis, unspecified: Secondary | ICD-10-CM | POA: Diagnosis not present

## 2022-04-24 DIAGNOSIS — R748 Abnormal levels of other serum enzymes: Secondary | ICD-10-CM | POA: Diagnosis not present

## 2022-04-24 DIAGNOSIS — R194 Change in bowel habit: Secondary | ICD-10-CM | POA: Diagnosis not present

## 2022-04-24 DIAGNOSIS — K5900 Constipation, unspecified: Secondary | ICD-10-CM

## 2022-04-24 DIAGNOSIS — M545 Low back pain, unspecified: Secondary | ICD-10-CM | POA: Diagnosis not present

## 2022-04-24 DIAGNOSIS — J449 Chronic obstructive pulmonary disease, unspecified: Secondary | ICD-10-CM | POA: Diagnosis not present

## 2022-04-24 DIAGNOSIS — N3289 Other specified disorders of bladder: Secondary | ICD-10-CM | POA: Diagnosis not present

## 2022-04-24 DIAGNOSIS — R935 Abnormal findings on diagnostic imaging of other abdominal regions, including retroperitoneum: Secondary | ICD-10-CM | POA: Diagnosis not present

## 2022-04-24 DIAGNOSIS — R634 Abnormal weight loss: Secondary | ICD-10-CM | POA: Diagnosis not present

## 2022-04-24 DIAGNOSIS — R1084 Generalized abdominal pain: Secondary | ICD-10-CM

## 2022-04-24 DIAGNOSIS — J432 Centrilobular emphysema: Secondary | ICD-10-CM | POA: Diagnosis not present

## 2022-04-24 LAB — SODIUM, URINE, RANDOM: Sodium, Ur: 40 mmol/L

## 2022-04-24 LAB — TSH: TSH: 6.181 u[IU]/mL — ABNORMAL HIGH (ref 0.350–4.500)

## 2022-04-24 LAB — LITHIUM LEVEL: Lithium Lvl: 1.2 mmol/L (ref 0.60–1.20)

## 2022-04-24 LAB — OSMOLALITY, URINE: Osmolality, Ur: 349 mOsm/kg (ref 300–900)

## 2022-04-24 MED ORDER — ACETAMINOPHEN 650 MG RE SUPP: 650.0000 mg | Freq: Four times a day (QID) | RECTAL | Status: DC | PRN

## 2022-04-24 MED ORDER — VENLAFAXINE HCL 75 MG PO TABS
75.0000 mg | ORAL_TABLET | Freq: Every day | ORAL | Status: DC
  Filled 2022-04-24: qty 1

## 2022-04-24 MED ORDER — AMOXICILLIN-POT CLAVULANATE 875-125 MG PO TABS: 1.0000 | ORAL_TABLET | Freq: Two times a day (BID) | ORAL | 0 refills | Status: DC

## 2022-04-24 MED ORDER — LITHIUM CARBONATE 300 MG PO CAPS
600.0000 mg | ORAL_CAPSULE | Freq: Every day | ORAL | Status: DC
  Administered 2022-04-25 – 2022-04-26 (×2): 600 mg via ORAL
  Filled 2022-04-24 (×3): qty 2

## 2022-04-24 MED ORDER — NICOTINE 14 MG/24HR TD PT24
14.0000 mg | MEDICATED_PATCH | Freq: Every day | TRANSDERMAL | Status: DC
  Administered 2022-04-24 – 2022-04-29 (×6): 14 mg via TRANSDERMAL
  Filled 2022-04-24 (×6): qty 1

## 2022-04-24 MED ORDER — NORTRIPTYLINE HCL 25 MG PO CAPS
50.0000 mg | ORAL_CAPSULE | Freq: Every day | ORAL | Status: DC
  Administered 2022-04-24 – 2022-04-28 (×5): 50 mg via ORAL
  Filled 2022-04-24 (×5): qty 2

## 2022-04-24 MED ORDER — FLUTICASONE PROPIONATE 50 MCG/ACT NA SUSP
2.0000 | Freq: Every day | NASAL | Status: DC
  Administered 2022-04-24 – 2022-04-29 (×6): 2 via NASAL
  Filled 2022-04-24: qty 16

## 2022-04-24 MED ORDER — REVEFENACIN 175 MCG/3ML IN SOLN
175.0000 ug | Freq: Every day | RESPIRATORY_TRACT | Status: DC
  Administered 2022-04-24 – 2022-04-28 (×5): 175 ug via RESPIRATORY_TRACT
  Filled 2022-04-24 (×7): qty 3

## 2022-04-24 MED ORDER — POLYETHYLENE GLYCOL 3350 17 G PO PACK: 17.0000 g | PACK | Freq: Three times a day (TID) | ORAL | Status: DC

## 2022-04-24 MED ORDER — BOOST / RESOURCE BREEZE PO LIQD CUSTOM
1.0000 | Freq: Three times a day (TID) | ORAL | Status: DC
  Administered 2022-04-24 – 2022-04-29 (×7): 1 via ORAL

## 2022-04-24 MED ORDER — SENNA 8.6 MG PO TABS
2.0000 | ORAL_TABLET | Freq: Two times a day (BID) | ORAL | Status: DC
  Administered 2022-04-24 – 2022-04-29 (×9): 17.2 mg via ORAL
  Filled 2022-04-24 (×9): qty 2

## 2022-04-24 MED ORDER — ARFORMOTEROL TARTRATE 15 MCG/2ML IN NEBU
15.0000 ug | INHALATION_SOLUTION | Freq: Two times a day (BID) | RESPIRATORY_TRACT | Status: DC
  Administered 2022-04-24 – 2022-04-29 (×9): 15 ug via RESPIRATORY_TRACT
  Filled 2022-04-24 (×10): qty 2

## 2022-04-24 MED ORDER — POLYETHYLENE GLYCOL 3350 17 GM/SCOOP PO POWD
0.5000 | Freq: Once | ORAL | Status: AC
  Administered 2022-04-24: 127.5 g via ORAL
  Filled 2022-04-24: qty 255

## 2022-04-24 MED ORDER — VENLAFAXINE HCL ER 75 MG PO CP24
75.0000 mg | ORAL_CAPSULE | Freq: Every day | ORAL | Status: DC
  Administered 2022-04-25 – 2022-04-29 (×5): 75 mg via ORAL
  Filled 2022-04-24 (×5): qty 1

## 2022-04-24 MED ORDER — LITHIUM CARBONATE ER 300 MG PO TBCR
300.0000 mg | EXTENDED_RELEASE_TABLET | Freq: Three times a day (TID) | ORAL | Status: DC
Start: 2022-04-24 — End: 2022-04-24
  Administered 2022-04-24: 300 mg via ORAL
  Filled 2022-04-24 (×3): qty 1

## 2022-04-24 MED ORDER — ACETAMINOPHEN 325 MG PO TABS
650.0000 mg | ORAL_TABLET | Freq: Four times a day (QID) | ORAL | Status: DC | PRN
  Administered 2022-04-24 – 2022-04-29 (×4): 650 mg via ORAL
  Filled 2022-04-24 (×5): qty 2

## 2022-04-24 MED ORDER — SODIUM CHLORIDE 0.9 % IV SOLN: INTRAVENOUS | Status: AC

## 2022-04-24 MED ORDER — AMLODIPINE BESYLATE 5 MG PO TABS
2.5000 mg | ORAL_TABLET | Freq: Every day | ORAL | Status: DC
  Administered 2022-04-24 – 2022-04-29 (×5): 2.5 mg via ORAL
  Filled 2022-04-24 (×6): qty 1

## 2022-04-24 MED ORDER — BISACODYL 10 MG RE SUPP: 10.0000 mg | Freq: Every day | RECTAL | Status: DC | PRN

## 2022-04-24 MED ORDER — IOHEXOL 300 MG/ML  SOLN
75.0000 mL | Freq: Once | INTRAMUSCULAR | Status: AC | PRN
  Administered 2022-04-24: 75 mL via INTRAVENOUS

## 2022-04-24 MED ORDER — ONDANSETRON HCL 4 MG PO TABS: 4.0000 mg | ORAL_TABLET | Freq: Four times a day (QID) | ORAL | Status: DC | PRN

## 2022-04-24 MED ORDER — ALBUTEROL SULFATE (2.5 MG/3ML) 0.083% IN NEBU: 2.5000 mg | INHALATION_SOLUTION | RESPIRATORY_TRACT | Status: DC | PRN

## 2022-04-24 MED ORDER — LORATADINE 10 MG PO TABS
10.0000 mg | ORAL_TABLET | Freq: Every day | ORAL | Status: DC
  Administered 2022-04-24 – 2022-04-29 (×6): 10 mg via ORAL
  Filled 2022-04-24 (×6): qty 1

## 2022-04-24 MED ORDER — ENOXAPARIN SODIUM 40 MG/0.4ML IJ SOSY
40.0000 mg | PREFILLED_SYRINGE | Freq: Every day | INTRAMUSCULAR | Status: DC
  Administered 2022-04-24 – 2022-04-26 (×3): 40 mg via SUBCUTANEOUS
  Filled 2022-04-24 (×3): qty 0.4

## 2022-04-24 MED ORDER — LITHIUM CARBONATE ER 300 MG PO TBCR
300.0000 mg | EXTENDED_RELEASE_TABLET | Freq: Every evening | ORAL | Status: DC
  Administered 2022-04-24 – 2022-04-28 (×5): 300 mg via ORAL
  Filled 2022-04-24 (×5): qty 1

## 2022-04-24 MED ORDER — ONDANSETRON HCL 4 MG/2ML IJ SOLN
4.0000 mg | Freq: Four times a day (QID) | INTRAMUSCULAR | Status: DC | PRN
  Administered 2022-04-25: 4 mg via INTRAVENOUS
  Filled 2022-04-24: qty 2

## 2022-04-24 MED ORDER — SODIUM CHLORIDE (PF) 0.9 % IJ SOLN
INTRAMUSCULAR | Status: AC
Start: 2022-04-24 — End: 2022-04-24
  Filled 2022-04-24: qty 50

## 2022-04-24 NOTE — ED Notes (Signed)
RT note: RN in to see pt., Oxygen sats 88-90% on room air, placed on 2 lpm n/c, has hx. PNA/COPD-Asthma and everyday smoker, sats >'d to 92-93%, RT to monitor.

## 2022-04-24 NOTE — Consult Note (Signed)
NAME:  Catherine Kelley, MRN:  [id number], DOB:  [birth year], LOS: 0 ADMISSION DATE:  04/23/2022, CONSULTATION DATE:  5/25 REFERRING MD:  [place] Pink , CHIEF COMPLAINT:  bronchiectasis    History of Present Illness:  71 year old female w/ hx as outlined below (followed by our clinic for BTX, has had prior necrotizing PNA, GOLD II COPD and tobacco abuse: last seen Feb 2016). Presented to ER 5/25 for evaluation & treatment  of constipation. While evaluating the abd by CT scan the lower lung fields demonstrated varicoid bronchiectasis greatest in the right lower lobe which is similar to prior imaging with interval development of peribronchial nodularity within the left lung base and numerous air-fluid levels within the bronchiectatic airways. She does not have an increase in respiratory symptoms at this time. She has been prescribed multiple rounds of prednisone and doxycycline for respiratory symptoms over the past few months.   PCCM has been consulted to evaluate patient's bronchiectasis and for her to re-establish care in clinic as she has been followed by Dr. Chase Caller in the past. Inflammatory labs have been negative in the past in regards to etiologies of her bronchiectasis. Her bronchiectasis is felt secondary to her smoking and chronic aspiration due to dysphagia as she has been recommended to have dysphagia II diet per Rexene Edison, NP note in 2017. Respiratory culture have grown pseudomonas in the past.  She reports having issues with night time awakenings due to dyspnea along with cough and wheezing but she has been doing well by using her inhaler. She has nebulizer machine and chest vest at home but does not use these often. She continues to smoke 1/2 pack per day. She does report a 20lb weight loss.   Pertinent  Medical History  Bipolar disease, bronchiectasis, COPD, emphysema, chronic pain w/ narcotic dependence, osteoporosis, diastolic dysfxn. Chronic low back pain, DDD, falls at home, heart murmur  , tobacco abuse  Significant Hospital Events: Including procedures, antibiotic start and stop dates in addition to other pertinent events   5/25 admitted for constipation  Interim History / Subjective:  As above  Objective   Blood pressure 137/61, pulse 87, temperature 97.9 F (36.6 C), resp. rate 16, height 5\' 3"  (1.6 m), weight 45.8 kg, SpO2 96 %.        Intake/Output Summary (Last 24 hours) at 04/24/2022 1234 Last data filed at 04/24/2022 1201 Gross per 24 hour  Intake 355 ml  Output no documentation  Net 355 ml   Filed Weights   04/23/22 1511  Weight: 45.8 kg    Examination: General: elderly woman, frail appearing, no acute distress HENT: [place]/AT, moist mucous membranes, sclera anicteric Lungs: diffuse course crackles posteriorly, no wheezing or rhonchi Cardiovascular: rrr, no murmurs Abdomen: soft, non-tender, non-distended, BS+ Extremities: warm, no edema Neuro: alert and oriented x3, moving all extremities GU: n/a   CT Chest Scan 04/24/22 1. Significant interval enlargement of a subpleural masslike consolidation of the posterior right upper lobe in comparison to prior CT of the chest dated 11/08/2020, highly suspicious for primary lung malignancy. 2. There is additional, new masslike consolidation laterally in the right upper lobe, as well as multiple additional foci of less solid-appearing subpleural ground-glass and consolidative airspace disease within the medial right upper lobe and subpleural left upper lobe. Although multifocal or metastatic malignancy is a general differential consideration, appearance of these additional lesions is more suggestive of airspace disease and chronic sequelae. PET-CT may be helpful for metabolic characterization of all of the above  lesions. 3. Interval enlargement of mediastinal and bilateral hilar lymph nodes, possibly reactive although suspicious for nodal metastatic disease. As above, PET-CT may be helpful for  metabolic characterization. 4. Severe, varicoid and cystic bronchiectasis in the right greater than left bilateral lung bases, as seen by prior examination. Slightly worsened associated nodular airspace disease, particularly in the left lung base. Findings are consistent with chronic sequelae of aspiration, likely ongoing. 5. Severe emphysema. 6. Coronary artery disease. 7. Densely sclerotic osseous lesions of the T11 and lower included vertebral bodies, better included by prior CT of the abdomen and pelvis. These remain highly suspicious for osseous metastases, however general differential considerations would include chronic osteomyelitis given the apparently isolated, focal distribution as well as no previous history of treatment to induce post treatment sclerosis. No additional osseous lesions identified in the chest above the T11 vertebral body. Resolved Hospital Problem list     Assessment & Plan:  Severe Varicoid Bronchiectasis, Bibasilar R > L Severe Emphysema Subpleural Mass like consolidation, posterior RUL, lateral RUL and subpleural LLL Sclerotic Lesions of T11, T12, L1 and L2  Plan: - start yupelri and brovana nebulizer treatments - can discharge on anoro ellipta, bevespir aerosphere or stiolto inhaler based on insurance coverage - PRN albuterol - Will check immunoglobulin levels for completeness of her bronchiectasis workup - Recommend outpatient follow up soon after hospitalization to have PET scan ordered for further evaluation of the subpleural mass like consolidations. She is high risk for malignancy given her smoking history - Smoking cessation recommended  - Primary team obtaining NM bone scan for the sclerotic vertebral lesions  PCCM will sign off. Please call with any further questions.   Best Practice (right click and "Reselect all SmartList Selections" daily)   Per primary  Labs   CBC: Recent Labs  Lab 04/17/22 1434 04/23/22 1846  WBC 11.7* 10.7*   NEUTROABS 10.2* 9.0*  HGB 13.9 14.0  HCT 45.0 44.5  MCV 96.6 94.1  PLT 202 810    Basic Metabolic Panel: Recent Labs  Lab 04/17/22 1434 04/23/22 1846  NA 130* 127*  K 4.4 4.6  CL 98 95*  CO2 25 24  GLUCOSE 152* 86  BUN 10 9  CREATININE 0.61 0.57  CALCIUM 9.6 10.1   GFR: Estimated Creatinine Clearance: 46.6 mL/min (by C-G formula based on SCr of 0.57 mg/dL). Recent Labs  Lab 04/17/22 1434 04/23/22 1846  WBC 11.7* 10.7*    Liver Function Tests: Recent Labs  Lab 04/17/22 1434 04/23/22 1846  AST 20 28  ALT 18 22  ALKPHOS 155* 209*  BILITOT 0.7 0.6  PROT 7.3 7.6  ALBUMIN 3.2* 3.7   Recent Labs  Lab 04/17/22 1434 04/23/22 1846  LIPASE 38 29   No results for input(s): AMMONIA in the last 168 hours.  ABG    Component Value Date/Time   PHART 7.445 01/09/2016 2226   PCO2ART 47.6 (H) 01/09/2016 2226   PO2ART 62.0 (L) 01/09/2016 2226   HCO3 32.7 (H) 01/09/2016 2226   TCO2 34 01/09/2016 2226   O2SAT 92.0 01/09/2016 2226     Coagulation Profile: No results for input(s): INR, PROTIME in the last 168 hours.  Cardiac Enzymes: No results for input(s): CKTOTAL, CKMB, CKMBINDEX, TROPONINI in the last 168 hours.  HbA1C: Hgb A1c MFr Bld  Date/Time Value Ref Range Status  09/26/2021 11:45 AM 5.1 4.6 - 6.5 % Final    Comment:    Glycemic Control Guidelines for People with Diabetes:Non Diabetic:  <6%Goal of Therapy: <  7%Additional Action Suggested:  >8%   11/08/2020 02:18 AM 4.8 4.8 - 5.6 % Final    Comment:    (NOTE) Pre diabetes:          5.7%-6.4%  Diabetes:              >6.4%  Glycemic control for   <7.0% adults with diabetes     CBG: No results for input(s): GLUCAP in the last 168 hours.  Review of Systems:   Review of Systems  Constitutional:  Positive for weight loss. Negative for chills, fever and malaise/fatigue.  HENT:  Negative for congestion, sinus pain and sore throat.   Eyes: Negative.   Respiratory:  Negative for cough, hemoptysis,  sputum production, shortness of breath and wheezing.   Cardiovascular:  Negative for chest pain, palpitations, orthopnea, claudication and leg swelling.  Gastrointestinal:  Positive for constipation. Negative for abdominal pain, heartburn, nausea and vomiting.  Genitourinary: Negative.   Musculoskeletal:  Negative for joint pain and myalgias.  Skin:  Negative for rash.  Neurological:  Negative for weakness.  Endo/Heme/Allergies: Negative.   Psychiatric/Behavioral: Negative.      Past Medical History:  She,  has a past medical history of Anxiety, Arthritis, Asthma, Bipolar disorder ([place]), Bipolar I disorder ([place]) (10/11/2019), CAP (community acquired pneumonia) (09/18/2017), Cat allergies, Chronic bronchitis ([place]), Chronic lower back pain, COPD (chronic obstructive pulmonary disease) ([place]), DDD (degenerative disc disease), lumbar, Depression, Diastolic dysfunction, Emphysematous COPD ([place]), Fall as cause of accidental injury at home as place of occurrence (10/11/2019), Fall involving wheelchair as cause of accidental injury (10/11/2019), Falls frequently, GERD (gastroesophageal reflux disease), HCAP (healthcare-associated pneumonia) (02/12/2016), Heart murmur, History of cardiovascular stress test (08/15/2004), History of echocardiogram (12/24/2006), Hypertension, Migraine, Necrotic pneumonia ([place]), Pneumonia, Pollen allergies, Tobacco abuse, Tobacco dependence due to cigarettes (10/11/2019), and Traumatic hematoma of knee, right, initial encounter (10/11/2019).   Surgical History:   Past Surgical History:  Procedure Laterality Date   CAROTID STENT     DILATION AND CURETTAGE OF UTERUS     TUBAL LIGATION  1986     Social History:   reports that she has been smoking cigarettes. She has a 23.00 pack-year smoking history. She has never used smokeless tobacco. She reports current alcohol use. She reports that she does not use drugs.   Family History:  Her family history includes Asthma in her  maternal grandmother; Breast cancer in her maternal aunt; Heart disease in her father; Hyperlipidemia in her father; Hypertension in her father; Stroke in her mother.   Allergies Allergies  Allergen Reactions   Sulfa Drugs Cross Reactors Hives and Rash   Ceclor [Cefaclor] Hives    Tolerated ceftazidime December 2016   Depakote Er [Divalproex Sodium Er] Other (See Comments)    Patient remarked that when she was taking this TWICE a day, she became "clumsy" and felt more prone to stumbling   Escitalopram Oxalate Other (See Comments)    Pt does not recall ever taking medication (??)   Latex Itching   Other Other (See Comments)    Hydrocort Cream  Unknown   Sertraline Hcl Other (See Comments)    Severe headaches    Tape Itching and Rash    Band-Aids, also (PAPER TAPE IS TOLERATED)     Home Medications  Prior to Admission medications   Medication Sig Start Date End Date Taking? Authorizing Provider  lithium carbonate (LITHOBID) 300 MG CR tablet Take 1 tablet (300 mg total) by mouth 3 (three) times daily. Patient taking differently:  Take 300-600 mg by mouth 2 (two) times daily. Take 600 mg by mouth in the morning and 300 mg in the evening. 01/15/22 01/15/23 Yes Arfeen, Arlyce Harman, MD  venlafaxine (EFFEXOR) 75 MG tablet Take 1 tablet (75 mg total) by mouth daily. 01/15/22 01/15/23 Yes Arfeen, Arlyce Harman, MD  albuterol (VENTOLIN HFA) 108 (90 Base) MCG/ACT inhaler TAKE 2 PUFFS BY MOUTH EVERY 6 HOURS AS NEEDED FOR WHEEZE OR SHORTNESS OF BREATH 02/07/22   Maximiano Coss, NP  alendronate (FOSAMAX) 70 MG tablet Take 1 tablet (70 mg total) by mouth every 7 (seven) days. Take with a full glass of water on an empty stomach. Patient not taking: Reported on 01/15/2022 01/13/20   Wendall Mola, NP  amLODipine (NORVASC) 2.5 MG tablet TAKE 1 TABLET BY MOUTH EVERY DAY 07/06/20   Jacelyn Pi, Lilia Argue, MD  cetirizine (ZYRTEC) 10 MG tablet Take 1 tablet (10 mg total) by mouth daily. 02/15/21   Maximiano Coss, NP   fluticasone [facility]) 50 MCG/ACT nasal spray SPRAY 2 SPRAYS INTO EACH NOSTRIL EVERY DAY 11/09/20   Posey Boyer, MD  guaiFENesin-dextromethorphan [facility] DM) 100-10 MG/5ML syrup Take 5 mLs by mouth at bedtime as needed for cough. 02/18/22   Maximiano Coss, NP  HYDROcodone-Acetaminophen 5-300 MG TABS Take 1 tablet by mouth 2 (two) times daily as needed. 02/27/22   Maximiano Coss, NP  hydrOXYzine (VISTARIL) 25 MG capsule Take 1 capsule (25 mg total) by mouth at bedtime as needed for anxiety. Patient not taking: Reported on 01/15/2022 10/16/21   Arfeen, Arlyce Harman, MD  nortriptyline (PAMELOR) 50 MG capsule Take 1 capsule (50 mg total) by mouth at bedtime. 01/15/22   Arfeen, Arlyce Harman, MD  predniSONE (DELTASONE) 10 MG tablet TAKE 1 TABLET (10 MG TOTAL) BY MOUTH DAILY WITH BREAKFAST. 02/17/22   Maximiano Coss, NP  spironolactone (ALDACTONE) 50 MG tablet TAKE 1 TABLET BY MOUTH EVERY DAY Patient taking differently: Take 50 mg by mouth daily. 11/15/21   Maximiano Coss, NP  triamcinolone cream (KENALOG) 0.1 % Apply 1 application topically 2 (two) times daily. 10/18/19   Wendall Mola, NP  Vitamin D, Ergocalciferol, (DRISDOL) 1.25 MG (50000 UNIT) CAPS capsule TAKE 1 CAPSULE (50,000 UNITS TOTAL) BY MOUTH EVERY 7 (SEVEN) DAYS 02/07/22   Maximiano Coss, NP     Critical care time: n/a    Freda Jackson, MD [facility] Office: [phone]   See Amion for personal pager PCCM on call pager [phone] until 7pm. Please call Elink 7p-7a. [phone]

## 2022-04-24 NOTE — H&P (Addendum)
[facility] History and Physical  Catherine Kelley UEA:[id number] DOB: November 09, 1951 DOA: 04/23/2022   PCP: Maximiano Coss, NP  Specialists: None that she knows of  Chief Complaint: Constipation  HPI: Catherine Kelley is a 71 y.o. female with past medical history of essential hypertension, bronchiectasis, bipolar disorder, chronic back pain who has had multiple visits to the emergency department and her PCP for constipation over the last few weeks.  She stopped taking her narcotic pain medications a few weeks ago.  Has had some abdominal discomfort but no nausea or vomiting.  Reports a weight loss of 10 pounds over the last 2 to 3 weeks.  Back pain has also worsened but denies any weakness in her legs.  No radiation of the pain down her legs.  States that she is up-to-date on her mammograms.  Does not remember ever having had a colonoscopy previously.  No history of thyroid disease.  In the emergency department she was given laxatives and enemas without any significant relief.  CT scan did not show any obstruction but raised concern for significant constipation along with lesions in her thoracic spine raising concern for metastatic disease.  Patient was hospitalized for further management.  Home Medications: This list is not reconciled yet. Prior to Admission medications   Medication Sig Start Date End Date Taking? Authorizing Provider  albuterol (VENTOLIN HFA) 108 (90 Base) MCG/ACT inhaler TAKE 2 PUFFS BY MOUTH EVERY 6 HOURS AS NEEDED FOR WHEEZE OR SHORTNESS OF BREATH 02/07/22   Maximiano Coss, NP  alendronate (FOSAMAX) 70 MG tablet Take 1 tablet (70 mg total) by mouth every 7 (seven) days. Take with a full glass of water on an empty stomach. Patient not taking: Reported on 01/15/2022 01/13/20   Wendall Mola, NP  amLODipine (NORVASC) 2.5 MG tablet TAKE 1 TABLET BY MOUTH EVERY DAY 07/06/20   Jacelyn Pi, Lilia Argue, MD  cetirizine (ZYRTEC) 10 MG tablet Take 1 tablet (10 mg total) by mouth daily.  02/15/21   Maximiano Coss, NP  fluticasone [facility]) 50 MCG/ACT nasal spray SPRAY 2 SPRAYS INTO EACH NOSTRIL EVERY DAY 11/09/20   Posey Boyer, MD  guaiFENesin-dextromethorphan [facility] DM) 100-10 MG/5ML syrup Take 5 mLs by mouth at bedtime as needed for cough. 02/18/22   Maximiano Coss, NP  HYDROcodone-Acetaminophen 5-300 MG TABS Take 1 tablet by mouth 2 (two) times daily as needed. 02/27/22   Maximiano Coss, NP  hydrOXYzine (VISTARIL) 25 MG capsule Take 1 capsule (25 mg total) by mouth at bedtime as needed for anxiety. Patient not taking: Reported on 01/15/2022 10/16/21   Arfeen, Arlyce Harman, MD  lithium carbonate (LITHOBID) 300 MG CR tablet Take 1 tablet (300 mg total) by mouth 3 (three) times daily. 01/15/22 01/15/23  Arfeen, Arlyce Harman, MD  nortriptyline (PAMELOR) 50 MG capsule Take 1 capsule (50 mg total) by mouth at bedtime. 01/15/22   Arfeen, Arlyce Harman, MD  predniSONE (DELTASONE) 10 MG tablet TAKE 1 TABLET (10 MG TOTAL) BY MOUTH DAILY WITH BREAKFAST. 02/17/22   Maximiano Coss, NP  spironolactone (ALDACTONE) 50 MG tablet TAKE 1 TABLET BY MOUTH EVERY DAY 11/15/21   Maximiano Coss, NP  triamcinolone cream (KENALOG) 0.1 % Apply 1 application topically 2 (two) times daily. 10/18/19   Wendall Mola, NP  venlafaxine (EFFEXOR) 75 MG tablet Take 1 tablet (75 mg total) by mouth daily. 01/15/22 01/15/23  Arfeen, Arlyce Harman, MD  Vitamin D, Ergocalciferol, (DRISDOL) 1.25 MG (50000 UNIT) CAPS capsule TAKE 1 CAPSULE (50,000 UNITS TOTAL) BY  MOUTH EVERY 7 (SEVEN) DAYS 02/07/22   Maximiano Coss, NP    Allergies:  Allergies  Allergen Reactions   Sulfa Drugs Cross Reactors Hives and Rash   Ceclor [Cefaclor] Hives    Tolerated ceftazidime December 2016   Depakote Er [Divalproex Sodium Er] Other (See Comments)    Patient remarked that when she was taking this TWICE a day, she became "clumsy" and felt more prone to stumbling   Escitalopram Oxalate Other (See Comments)    Pt does not recall ever taking medication  (??)   Latex Itching   Other Other (See Comments)    Hydrocort Cream  Unknown   Sertraline Hcl Other (See Comments)    Severe headaches    Tape Itching and Rash    Band-Aids, also (PAPER TAPE IS TOLERATED)    Past Medical History: Past Medical History:  Diagnosis Date   Anxiety    Arthritis    "jooints" (09/18/2017)   Asthma    Bipolar disorder ([place])    Bipolar I disorder ([place]) 10/11/2019   CAP (community acquired pneumonia) 09/18/2017   Cat allergies    Chronic bronchitis ([place])    "get it alot; maybe not q yr" (07/06/2014)   Chronic lower back pain    "spurs and pinched nerves" (09/18/2017)   COPD (chronic obstructive pulmonary disease) ([place])    DDD (degenerative disc disease), lumbar    Depression    psychiatrist Dr. 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  There is no evidence of ischemia and there is normal LV function. -- [name]. MD   History of echocardiogram 12/24/2006   Est. EF of 43-15% NORMAL LV SYSTOLIC FUNCTION WITH IMPAIRED RELAXATION -- MILD AORTIC SCLEROSIS -- NORMAL PALONARY ARTERY PRESSURE -- NO OLD ECHOS FOR COMPARISON -- [name], MD   Hypertension    essential hypertension   Migraine    "when I was having my periods; none since then" (09/18/2017)   Necrotic pneumonia ([place])    recurrent/notes 02/12/2016   Pneumonia    "several times since May 2015" (07/06/2014)   Pollen allergies    Tobacco abuse    smoking about 1/2 pk of  cigarettes a day   Tobacco dependence due to cigarettes 10/11/2019   Traumatic hematoma of knee, right, initial encounter 10/11/2019    Past Surgical History:  Procedure Laterality Date   CAROTID STENT     DILATION AND CURETTAGE OF UTERUS     TUBAL LIGATION  1986    Social History: Smokes half pack cigarettes on a daily basis.  Drinks 1-2 shots of whiskey 3 times a week.  Denies drinking daily.  Lives with her husband.  Family History:  Family History  Problem Relation Age of Onset   Stroke Mother    Heart disease Father    Hyperlipidemia Father    Hypertension Father    Breast cancer Maternal Aunt    Asthma Maternal Grandmother  Review of Systems - History obtained from the patient General ROS: negative Psychological ROS: negative Ophthalmic ROS: negative ENT ROS: negative Allergy and Immunology ROS: negative Hematological and Lymphatic ROS: negative Endocrine ROS: negative Respiratory ROS: Denies any worsening of her respiratory status recently.  Denies any shortness of breath currently.  No cough. Cardiovascular ROS: no chest pain or dyspnea on exertion Gastrointestinal ROS: As in HPI Genito-Urinary ROS: no dysuria, trouble voiding, or hematuria Musculoskeletal ROS: Back pain as mentioned in HPI Neurological ROS: no TIA or stroke symptoms Dermatological ROS: negative  Physical Examination  Vitals:   04/24/22 0730 04/24/22 0830 04/24/22 0834 04/24/22 0918  BP: (!) 129/55 133/77  137/61  Pulse: 82 83  87  Resp: 18 16  16   Temp:   98 F (36.7 C) 97.9 F (36.6 C)  TempSrc:   Oral   SpO2: 92% 95%  96%  Weight:      Height:        BP 137/61 (BP Location: Right Arm)   Pulse 87   Temp 97.9 F (36.6 C)   Resp 16   Ht 5\' 3"  (1.6 m)   Wt 45.8 kg   SpO2 96%   BMI 17.89 kg/m   General appearance: alert, cooperative, appears stated age, and no distress Head: Normocephalic, without obvious abnormality, atraumatic Eyes: conjunctivae/corneas clear. PERRL,  EOM's intact. Throat: lips, mucosa, and tongue normal; teeth and gums normal Neck: no adenopathy, no carotid bruit, no JVD, supple, symmetrical, trachea midline, and thyroid not enlarged, symmetric, no tenderness/mass/nodules Back: symmetric, no curvature. ROM normal. No CVA tenderness. Resp: Normal effort at rest.  Coarse breath sounds laterally with few crackles at the bases.  No wheezing or rhonchi. Cardio: regular rate and rhythm, S1, S2 normal, no murmur, click, rub or gallop GI: soft, mild tenderness noted in the lower abdomen on both sides.  No rebound rigidity or guarding.  No obvious masses organomegaly.  Fullness is appreciated however. Extremities: extremities normal, atraumatic, no cyanosis or edema Pulses: 2+ and symmetric Skin: Skin color, texture, turgor normal. No rashes or lesions Neurologic: Awake alert.  Cranial nerves II to XII intact.  No focal neurological deficits appreciated.  Able to lift both legs off the bed.   Labs on Admission: I have personally reviewed following labs and imaging studies  CBC: Recent Labs  Lab 04/17/22 1434 04/23/22 1846  WBC 11.7* 10.7*  NEUTROABS 10.2* 9.0*  HGB 13.9 14.0  HCT 45.0 44.5  MCV 96.6 94.1  PLT 202 093   Basic Metabolic Panel: Recent Labs  Lab 04/17/22 1434 04/23/22 1846  NA 130* 127*  K 4.4 4.6  CL 98 95*  CO2 25 24  GLUCOSE 152* 86  BUN 10 9  CREATININE 0.61 0.57  CALCIUM 9.6 10.1   GFR: Estimated Creatinine Clearance: 46.6 mL/min (by C-G formula based on SCr of 0.57 mg/dL). Liver Function Tests: Recent Labs  Lab 04/17/22 1434 04/23/22 1846  AST 20 28  ALT 18 22  ALKPHOS 155* 209*  BILITOT 0.7 0.6  PROT 7.3 7.6  ALBUMIN 3.2* 3.7   Recent Labs  Lab 04/17/22 1434 04/23/22 1846  LIPASE 38 29    Thyroid Function Tests: Recent Labs    04/24/22 1020  TSH 6.181*    Radiological Exams on Admission: DG Abdomen 1 View  Result Date: 04/23/2022 CLINICAL DATA:  Constipation. EXAM: ABDOMEN - 1  VIEW COMPARISON:  None Available. FINDINGS: A moderate amount of stool is present in the rectum and distal colon.  There is mild gaseous distension of the transverse colon. No small bowel dilatation is seen to suggest obstruction. No acute osseous abnormality is evident. IMPRESSION: Moderate amount of stool in the distal colon and rectum. No evidence of bowel obstruction. Electronically Signed   By: Logan Bores M.D.   On: 04/23/2022 18:58   CT ABDOMEN PELVIS W CONTRAST  Result Date: 04/23/2022 CLINICAL DATA:  Constipation, bowel obstruction EXAM: CT ABDOMEN AND PELVIS WITH CONTRAST TECHNIQUE: Multidetector CT imaging of the abdomen and pelvis was performed using the standard protocol following bolus administration of intravenous contrast. RADIATION DOSE REDUCTION: This exam was performed according to the departmental dose-optimization program which includes automated exposure control, adjustment of the mA and/or kV according to patient size and/or use of iterative reconstruction technique. CONTRAST:  40mL OMNIPAQUE IOHEXOL 300 MG/ML  SOLN COMPARISON:  CT chest 11/08/2020 FINDINGS: Lower chest: There is severe varicoid bronchiectasis, asymmetrically more severe within the right lung base, similar to that noted on prior examination. This may reflect the sequela of recurrent aspiration, mucociliary disorders, or remote and/or recurrent infection. There is interval development of numerous air-fluid levels within the bronchiectatic airways of the visualized right lung base and peribronchial nodularity within the left lower lobe which may reflect changes of superimposed acute infection or aspiration. Spiculated opacities within the peripheral right lung base appears stable since prior examination and are most in keeping with post inflammatory scarring. Cardiac size within normal limits. Hepatobiliary: No focal liver abnormality is seen. No gallstones, gallbladder wall thickening, or biliary dilatation. Pancreas:  Unremarkable Spleen: Unremarkable Adrenals/Urinary Tract: Adrenal glands are unremarkable. Kidneys are normal, without renal calculi, focal lesion, or hydronephrosis. Bladder is unremarkable. Stomach/Bowel: Large volume stool seen within the rectal vault. Mild perirectal edema may reflect changes of superimposed stercoral proctitis. Moderate stool seen throughout the remainder of the colon. Stomach, small bowel, and large bowel are otherwise unremarkable. Appendix normal. No free intraperitoneal gas or fluid. Vascular/Lymphatic: Extensive aortoiliac atherosclerotic calcification. No aortic aneurysm. No pathologic adenopathy within the abdomen and pelvis Reproductive: Uterus and bilateral adnexa are unremarkable. Other: No abdominal wall hernia Musculoskeletal: Sclerotic lesions are seen within the T11, T12, L1, and L2 vertebral bodies concerning for metastatic disease. Lesion within the T11 vertebral body appears new since prior examination and T12 vertebral body has progressed. IMPRESSION: Severe asymmetric varicoid bronchiectasis within the visualized lung bases. See discussion above. Interval development of peribronchial nodularity within the left lung base and numerous air-fluid levels within the bronchiectatic airways of the right lung base in keeping with superimposed infection or aspiration. Stable spiculated opacities within the right lung base most in keeping with post inflammatory change. Large volume stool within the rectal vault with perirectal edema suggesting changes of stercoral proctitis. Superimposed moderate stool burden throughout the colon. Progressive sclerotic lesions within the visualized thoracolumbar spine suspicious for progressive osseous metastatic disease. Correlation for history of primary malignancy is recommended. Aortic Atherosclerosis (ICD10-I70.0). Electronically Signed   By: Fidela Salisbury M.D.   On: 04/23/2022 20:19       Problem List  Principal Problem:   Colitis Active  Problems:   Stage 2 moderate COPD by GOLD classification ([place])   Cigarette smoker   Constipation   Lesion of bone of thoracic spine   Bronchiectasis (HCC)   Assessment: This is a 71 year old Caucasian female with past medical history as stated earlier who comes in with constipation ongoing for few weeks.  Relatively sudden onset.  Has lost some weight as well.  Does not recall  ever having had a colonoscopy previously.  Also found to have new vertebral lesions raising concern for metastatic process.  Plan:  Severe constipation Disimpaction, laxatives stool softeners ordered.  TSH noted to be mildly elevated at 6.18.  We will check free T4 tomorrow.  Discussed with gastroenterology as patient may need endoscopic evaluation. No longer on narcotics so that probably is not the reason for her constipation though could be contributing.  Vertebral lesions, sclerotic Multiple lesions noted in the thoracic and lumbar vertebrae.  These are sclerotic as per CT report.  Noted previously in 2021 as well but they appear to have progressed.  It does not appear the patient has had any work-up for these.  She tells me that she is up-to-date on her mammograms.  Her last mammogram in our system was in 2021 which was unremarkable.  She mentions that she is due for another mammogram this year. Her calcium level noted to be normal along with normal albumin. Proceed with CT scan of the chest.  Proceed with bone scan.  History of bronchiectasis/COPD It appears that she was previously followed by Dr. Chase Caller.  Has not seen them in several years.  Lung findings noted on CT abdomen appeared to be chronic for the most part.  But there are certain other components which suggest progression.   Patient does not have any active respiratory symptoms at this time.  We will proceed with CT chest as discussed above.  We will go ahead and consult pulmonology as well. Nebulizer treatment as needed.  Has had a few COPD  exacerbations in the past few months.  Has been on prednisone a few times.  Continues to smoke cigarettes.  Tobacco abuse Counseled.  Nicotine patch  Alcohol abuse Drinks 3 times a week.  Monitor for signs and symptoms of withdrawal.  Hyponatremia IV fluids will be administered.  Recheck labs tomorrow.  Check urine osmolality.  Check urine sodium.  Bipolar disorder Followed by psychiatry.  Noted to be on lithium, nortriptyline and venlafaxine.  Will check lithium level.   DVT Prophylaxis: Lovenox Code Status: Full code Family Communication: Discussed with patient.  No family at bedside Disposition: Hopefully return home in improved Consults called: Gastroenterology.  Pulmonology Admission Status: Status is: Observation The patient will require care spanning > 2 midnights and should be moved to inpatient because: Need for GI work-up.  Possible need for pulmonary work-up.    Severity of Illness: The appropriate patient status for this patient is OBSERVATION. Observation status is judged to be reasonable and necessary in order to provide the required intensity of service to ensure the patient's safety. The patient's presenting symptoms, physical exam findings, and initial radiographic and laboratory data in the context of their medical condition is felt to place them at decreased risk for further clinical deterioration. Furthermore, it is anticipated that the patient will be medically stable for discharge from the hospital within 2 midnights of admission.    Further management decisions will depend on results of further testing and patient's response to treatment.   Colleen Donahoe [employer]  [facility] operational officer on [email or web address]  04/24/2022, 11:48 AM

## 2022-04-24 NOTE — Consult Note (Addendum)
Consultation  Referring Provider:   Dr. Maryland Pink Primary Care Physician:  Maximiano Coss, NP Primary Gastroenterologist:  Althia Forts  but has [place] PCP and scheduled follow up at our office, possibly seen Dr. Collene Mares 2015 Reason for Consultation:     Colitis, constipation, bone lesions   Impression    Constipation Previously on hydrocodone stopped 2 to 3 weeks ago. Per Dr. Collene Mares last colon was 10/10/09 and was normal, never had recall colonoscopy, getting report faxed and will scan into epic. No anemia   Stercoral proctitis CT AB   Large volume stool within the rectal vault with perirectal edema suggesting changes of stercoral proctitis.  Superimposed moderate stool burden throughout the colon.  Thoracic spine bone lesions with elevated alk phos AST 28 ALT 22  Alkphos 209 TBili 0.6 Normal liver function otherwise Alk phos likely due to bone lesions, unknown primary- pending NM bone scan  Bronchiectasis Follows with Dr. Chase Caller  Stage II COPD with likely exacerbation  requiring 2 L nasal cannula which patient's not on at home CT abdomen pelvis with contrast showed severe bronchiectasis possible superimposed infection or aspiration.          Patient profile    71 year old female with bronchiectasis/emphysema COPD currently requiring 2 L nasal cannula, presenting with acute onset constipation and abdominal pain, hydrocodone use stopped 2 weeks ago. Found to have large volume stool on CT with possible stercoral proctitis with rectal wall thickening, also found to have possible worsening sclerotic bone lesions thoracic spine. No anemia, last colon 2010 with Dr. Collene Mares, with rectal thickening, acute constipation changes, and bone lesions will want to rule out colorectal cancer.    Plan   -Patient would likely benefit from colonoscopy this visit, will discuss timing with Dr. Henrene Pastor. -Likely patient would need 2-day prep and pulmonary needs to be maximized prior to  endoscopic evaluation -Will do clear liquid diet at this time --We will maximize bowel regiment,  with MiraLAX bowel purge today.  - dulcolax suppository, continue senokot twice daily. - Do another enema this evening.  Thank you for your kind consultation, we will continue to follow.         HPI:    Catherine Kelley is a 71 y.o. female with past medical history significant for bipolar 1 on lithium, bronchiectasis with emphysema COPD, hypertension, hyponatremia, history of chronic pain on opioids outpatient, osteoporosis presents with worsening constipation and abdominal pain for 2 weeks.  Follows with Dr. Theda Sers pulmonary and Dr. Mare Ferrari at cardiology.  EF 53-20% mild diastolic dysfunction echocardiogram 2018.  Patient lying in bed, no family at bedside, admits to poor memory and appears to be poor historian Patient was on chronic hydrocodone for back pain, stopped about 2 weeks ago due to worsening constipation. Was having 1 bowel movement daily mixed diarrhea and constipation for the last 6 months, occasional constipation requiring laxative use, never sees blood in stool.  However in the last 2 to 3 weeks patient was having severe constipation, denies new medications. Patient's had 10 pound weight loss in about 3 months. Patient's had nausea without any vomiting.  Denies reflux, dysphagia. 04/17/2022 ER visit for no improvement 1 to 2 days oral laxatives, abdominal distention pain.  No nausea or vomiting.  Manually disimpacted soapsuds enema liquid stool output. 04/23/2022 patient saw primary care states had not had bowel movement since that ER visit difficulty walking laxatives and suppositories not working. Patient urgently referred to gastroenterology that office visit. Patient denies  family history of colon cancer. Reviewing patient's chart possible colonoscopy with Dr. Collene Mares 2015, we will try to obtain this.  Presented to the ER yesterday with continuing constipation not  responsive to suppositories and enemas.  Having diffuse abdominal pain. CT abdomen pelvis with contrast showed severe bronchiectasis possible superimposed infection or aspiration.  Large volume stool within the rectal vault with perirectal edema suggesting changes of stercoral proctitis.  Superimposed moderate stool burden throughout the colon.  Progressive sclerotic lesions thoracolumbar spine suspicious for progressive metastatic disease. Labs show leukocytosis 10.7, no anemia, hyponatremia 127, elevated alk phos 209, normal lipase.  Attempted digital disimpaction in the ER was unsuccessful with large stool ball felt in the rectal vault.  Small external hemorrhoids.  Clay colored stool no bleeding.   04/23/2022 CT AB and pelvis with contrast   Abnormal ED labs: Abnormal Labs Reviewed  CBC WITH DIFFERENTIAL/PLATELET - Abnormal; Notable for the following components:      Result Value   WBC 10.7 (*)    Neutro Abs 9.0 (*)    Abs Immature Granulocytes 0.08 (*)    All other components within normal limits  COMPREHENSIVE METABOLIC PANEL - Abnormal; Notable for the following components:   Sodium 127 (*)    Chloride 95 (*)    Alkaline Phosphatase 209 (*)    All other components within normal limits     Past Medical History:  Diagnosis Date   Anxiety    Arthritis    "jooints" (09/18/2017)   Asthma    Bipolar disorder ([place])    Bipolar I disorder ([place]) 10/11/2019   CAP (community acquired pneumonia) 09/18/2017   Cat allergies    Chronic bronchitis ([place])    "get it alot; maybe not q yr" (07/06/2014)   Chronic lower back pain    "spurs and pinched nerves" (09/18/2017)   COPD (chronic obstructive pulmonary disease) ([place])    DDD (degenerative disc disease), lumbar    Depression    psychiatrist Dr. Toy Care   Diastolic dysfunction    Emphysematous COPD ([place])    changes in right base along with patchy areas on last x-ray   Fall as cause of accidental injury at home as place of occurrence  10/11/2019   Fall involving wheelchair as cause of accidental injury 10/11/2019   Falls frequently    "several in 2017; fell at dr's office yesterday" (09/18/2017)   GERD (gastroesophageal reflux disease)    HCAP (healthcare-associated pneumonia) 02/12/2016   Heart murmur    History of cardiovascular stress test 08/15/2004   EF of 70% -- Normal stress cardiolite.  There is no evidence of ischemia and there is normal LV function. -- [name]. MD   History of echocardiogram 12/24/2006   Est. EF of 07-68% NORMAL LV SYSTOLIC FUNCTION WITH IMPAIRED RELAXATION -- MILD AORTIC SCLEROSIS -- NORMAL PALONARY ARTERY PRESSURE -- NO OLD ECHOS FOR COMPARISON -- [name], MD   Hypertension    essential hypertension   Migraine    "when I was having my periods; none since then" (09/18/2017)   Necrotic pneumonia ([place])    recurrent/notes 02/12/2016   Pneumonia    "several times since May 2015" (07/06/2014)   Pollen allergies    Tobacco abuse    smoking about 1/2 pk of cigarettes a day   Tobacco dependence due to cigarettes 10/11/2019   Traumatic hematoma of knee, right, initial encounter 10/11/2019    Surgical History:  She  has a past surgical history that includes Tubal ligation (1986);  Dilation and curettage of uterus; and Carotid stent. Family History:  Her family history includes Asthma in her maternal grandmother; Breast cancer in her maternal aunt; Heart disease in her father; Hyperlipidemia in her father; Hypertension in her father; Stroke in her mother. Social History:   reports that she has been smoking cigarettes. She has a 23.00 pack-year smoking history. She has never used smokeless tobacco. She reports current alcohol use. She reports that she does not use drugs.  Prior to Admission medications   Medication Sig Start Date End Date Taking? Authorizing Provider  albuterol (VENTOLIN HFA) 108 (90 Base) MCG/ACT inhaler TAKE 2 PUFFS BY MOUTH EVERY 6 HOURS AS NEEDED FOR WHEEZE OR  SHORTNESS OF BREATH 02/07/22   Maximiano Coss, NP  alendronate (FOSAMAX) 70 MG tablet Take 1 tablet (70 mg total) by mouth every 7 (seven) days. Take with a full glass of water on an empty stomach. Patient not taking: Reported on 01/15/2022 01/13/20   Wendall Mola, NP  amLODipine (NORVASC) 2.5 MG tablet TAKE 1 TABLET BY MOUTH EVERY DAY 07/06/20   Jacelyn Pi, Lilia Argue, MD  cetirizine (ZYRTEC) 10 MG tablet Take 1 tablet (10 mg total) by mouth daily. 02/15/21   Maximiano Coss, NP  fluticasone [facility]) 50 MCG/ACT nasal spray SPRAY 2 SPRAYS INTO EACH NOSTRIL EVERY DAY 11/09/20   Posey Boyer, MD  guaiFENesin-dextromethorphan [facility] DM) 100-10 MG/5ML syrup Take 5 mLs by mouth at bedtime as needed for cough. 02/18/22   Maximiano Coss, NP  HYDROcodone-Acetaminophen 5-300 MG TABS Take 1 tablet by mouth 2 (two) times daily as needed. 02/27/22   Maximiano Coss, NP  hydrOXYzine (VISTARIL) 25 MG capsule Take 1 capsule (25 mg total) by mouth at bedtime as needed for anxiety. Patient not taking: Reported on 01/15/2022 10/16/21   Arfeen, Arlyce Harman, MD  lithium carbonate (LITHOBID) 300 MG CR tablet Take 1 tablet (300 mg total) by mouth 3 (three) times daily. 01/15/22 01/15/23  Arfeen, Arlyce Harman, MD  nortriptyline (PAMELOR) 50 MG capsule Take 1 capsule (50 mg total) by mouth at bedtime. 01/15/22   Arfeen, Arlyce Harman, MD  predniSONE (DELTASONE) 10 MG tablet TAKE 1 TABLET (10 MG TOTAL) BY MOUTH DAILY WITH BREAKFAST. 02/17/22   Maximiano Coss, NP  spironolactone (ALDACTONE) 50 MG tablet TAKE 1 TABLET BY MOUTH EVERY DAY 11/15/21   Maximiano Coss, NP  triamcinolone cream (KENALOG) 0.1 % Apply 1 application topically 2 (two) times daily. 10/18/19   Wendall Mola, NP  venlafaxine (EFFEXOR) 75 MG tablet Take 1 tablet (75 mg total) by mouth daily. 01/15/22 01/15/23  Arfeen, Arlyce Harman, MD  Vitamin D, Ergocalciferol, (DRISDOL) 1.25 MG (50000 UNIT) CAPS capsule TAKE 1 CAPSULE (50,000 UNITS TOTAL) BY MOUTH EVERY 7 (SEVEN) DAYS  02/07/22   Maximiano Coss, NP    Current Facility-Administered Medications  Medication Dose Route Frequency Provider Last Rate Last Admin   0.9 %  sodium chloride infusion   Intravenous Continuous Bonnielee Haff, MD       acetaminophen (TYLENOL) tablet 650 mg  650 mg Oral Q6H PRN Bonnielee Haff, MD       Or   acetaminophen (TYLENOL) suppository 650 mg  650 mg Rectal Q6H PRN Bonnielee Haff, MD       albuterol (PROVENTIL) (2.5 MG/3ML) 0.083% nebulizer solution 2.5 mg  2.5 mg Nebulization Q2H PRN Bonnielee Haff, MD       amLODipine (NORVASC) tablet 2.5 mg  2.5 mg Oral Daily Bonnielee Haff, MD  bisacodyl (DULCOLAX) suppository 10 mg  10 mg Rectal Daily PRN Bonnielee Haff, MD       enoxaparin (LOVENOX) injection 40 mg  40 mg Subcutaneous Q24H Bonnielee Haff, MD       fluticasone (FLONASE) 50 MCG/ACT nasal spray 2 spray  2 spray Each Nare Daily Bonnielee Haff, MD       lithium carbonate (LITHOBID) CR tablet 300 mg  300 mg Oral TID Bonnielee Haff, MD       loratadine (CLARITIN) tablet 10 mg  10 mg Oral Daily Bonnielee Haff, MD       nortriptyline (PAMELOR) capsule 50 mg  50 mg Oral QHS Bonnielee Haff, MD       ondansetron [facility]) tablet 4 mg  4 mg Oral Q6H PRN Bonnielee Haff, MD       Or   ondansetron [facility]) injection 4 mg  4 mg Intravenous Q6H PRN Bonnielee Haff, MD       polyethylene glycol (MIRALAX / GLYCOLAX) packet 17 g  17 g Oral TID Bonnielee Haff, MD       senna (SENOKOT) tablet 17.2 mg  2 tablet Oral BID Bonnielee Haff, MD       venlafaxine [facility]) tablet 75 mg  75 mg Oral Daily Bonnielee Haff, MD       [START ON 04/25/2022] venlafaxine XR (EFFEXOR-XR) 24 hr capsule 75 mg  75 mg Oral Q breakfast Bonnielee Haff, MD        Allergies as of 04/23/2022 - Review Complete 04/23/2022  Allergen Reaction Noted   Sulfa drugs cross reactors Hives and Rash 05/08/2011   Ceclor [cefaclor] Hives 05/08/2011   Depakote er [divalproex sodium er] Other (See Comments) 09/17/2017    Escitalopram oxalate Other (See Comments) 05/08/2011   Latex Itching 10/22/2019   Sertraline hcl Other (See Comments) 05/08/2011   Tape Itching and Rash 10/22/2019    Review of Systems:    Constitutional: No weight loss, fever, chills, weakness or fatigue HEENT: Eyes: No change in vision               Ears, Nose, Throat:  No change in hearing or congestion Skin: No rash or itching Cardiovascular: No chest pain, chest pressure or palpitations   Respiratory: No SOB or cough Gastrointestinal: See HPI and otherwise negative Genitourinary: No dysuria or change in urinary frequency Neurological: No headache, dizziness or syncope Musculoskeletal: No new muscle or joint pain Hematologic: No bleeding or bruising Psychiatric: No history of depression or anxiety     Physical Exam:  Vital signs in last 24 hours: Temp:  [97.7 F (36.5 C)-98.3 F (36.8 C)] 97.9 F (36.6 C) (05/25 0918) Pulse Rate:  [74-92] 87 (05/25 0918) Resp:  [16-20] 16 (05/25 0918) BP: (114-159)/(54-97) 137/61 (05/25 0918) SpO2:  [90 %-96 %] 96 % (05/25 0918) Weight:  [45.8 kg-46.1 kg] 45.8 kg (05/24 1511)   Last BM recorded by nurses in past 5 days No data recorded  General: Frail, thin chronically ill-appearing female 2 L nasal cannula Head:  Normocephalic and atraumatic. Eyes: sclerae anicteric,conjunctive pink  Heart:  regular rate and rhythm Pulm: Diffuse wheezing on 2 L Abdomen:  Soft, Flat AB, Hypoactive bowel sounds. mild tenderness in the lower abdomen. Without guarding and Without rebound, No organomegaly appreciated. Extremities:  Without edema. Msk:  Symmetrical without gross deformities. Peripheral pulses intact.  Neurologic:  Alert and  oriented x4;  No focal deficits.  Skin:   Dry and intact without significant lesions or rashes. Psychiatric:  Cooperative. Normal  mood and affect.  LAB RESULTS: Recent Labs    04/23/22 1846  WBC 10.7*  HGB 14.0  HCT 44.5  PLT 187   BMET Recent Labs     04/23/22 1846  NA 127*  K 4.6  CL 95*  CO2 24  GLUCOSE 86  BUN 9  CREATININE 0.57  CALCIUM 10.1   LFT Recent Labs    04/23/22 1846  PROT 7.6  ALBUMIN 3.7  AST 28  ALT 22  ALKPHOS 209*  BILITOT 0.6   PT/INR No results for input(s): LABPROT, INR in the last 72 hours.  STUDIES: DG Abdomen 1 View  Result Date: 04/23/2022 CLINICAL DATA:  Constipation. EXAM: ABDOMEN - 1 VIEW COMPARISON:  None Available. FINDINGS: A moderate amount of stool is present in the rectum and distal colon. There is mild gaseous distension of the transverse colon. No small bowel dilatation is seen to suggest obstruction. No acute osseous abnormality is evident. IMPRESSION: Moderate amount of stool in the distal colon and rectum. No evidence of bowel obstruction. Electronically Signed   By: Logan Bores M.D.   On: 04/23/2022 18:58   CT ABDOMEN PELVIS W CONTRAST  Result Date: 04/23/2022 CLINICAL DATA:  Constipation, bowel obstruction EXAM: CT ABDOMEN AND PELVIS WITH CONTRAST TECHNIQUE: Multidetector CT imaging of the abdomen and pelvis was performed using the standard protocol following bolus administration of intravenous contrast. RADIATION DOSE REDUCTION: This exam was performed according to the departmental dose-optimization program which includes automated exposure control, adjustment of the mA and/or kV according to patient size and/or use of iterative reconstruction technique. CONTRAST:  37m OMNIPAQUE IOHEXOL 300 MG/ML  SOLN COMPARISON:  CT chest 11/08/2020 FINDINGS: Lower chest: There is severe varicoid bronchiectasis, asymmetrically more severe within the right lung base, similar to that noted on prior examination. This may reflect the sequela of recurrent aspiration, mucociliary disorders, or remote and/or recurrent infection. There is interval development of numerous air-fluid levels within the bronchiectatic airways of the visualized right lung base and peribronchial nodularity within the left lower lobe  which may reflect changes of superimposed acute infection or aspiration. Spiculated opacities within the peripheral right lung base appears stable since prior examination and are most in keeping with post inflammatory scarring. Cardiac size within normal limits. Hepatobiliary: No focal liver abnormality is seen. No gallstones, gallbladder wall thickening, or biliary dilatation. Pancreas: Unremarkable Spleen: Unremarkable Adrenals/Urinary Tract: Adrenal glands are unremarkable. Kidneys are normal, without renal calculi, focal lesion, or hydronephrosis. Bladder is unremarkable. Stomach/Bowel: Large volume stool seen within the rectal vault. Mild perirectal edema may reflect changes of superimposed stercoral proctitis. Moderate stool seen throughout the remainder of the colon. Stomach, small bowel, and large bowel are otherwise unremarkable. Appendix normal. No free intraperitoneal gas or fluid. Vascular/Lymphatic: Extensive aortoiliac atherosclerotic calcification. No aortic aneurysm. No pathologic adenopathy within the abdomen and pelvis Reproductive: Uterus and bilateral adnexa are unremarkable. Other: No abdominal wall hernia Musculoskeletal: Sclerotic lesions are seen within the T11, T12, L1, and L2 vertebral bodies concerning for metastatic disease. Lesion within the T11 vertebral body appears new since prior examination and T12 vertebral body has progressed. IMPRESSION: Severe asymmetric varicoid bronchiectasis within the visualized lung bases. See discussion above. Interval development of peribronchial nodularity within the left lung base and numerous air-fluid levels within the bronchiectatic airways of the right lung base in keeping with superimposed infection or aspiration. Stable spiculated opacities within the right lung base most in keeping with post inflammatory change. Large volume stool within the rectal vault with  perirectal edema suggesting changes of stercoral proctitis. Superimposed moderate stool  burden throughout the colon. Progressive sclerotic lesions within the visualized thoracolumbar spine suspicious for progressive osseous metastatic disease. Correlation for history of primary malignancy is recommended. Aortic Atherosclerosis (ICD10-I70.0). Electronically Signed   By: Fidela Salisbury M.D.   On: 04/23/2022 20:19     Vladimir Crofts  04/24/2022, 10:12 AM   GI ATTENDING  History, laboratories, x-rays reviewed.  Patient seen and examined.  Agree with comprehensive consultation note as outlined above.  The patient presents to the hospital with abdominal pain secondary to obstipation and fecal impaction.  Large stool burden on CT.  Abnormal rectum on CT suggesting inflammatory changes related to fecal impaction. Working to purge bowel and remove disimpaction. Suspect constipation due to narcotics.  Apparently had colonoscopy in the past (about 10 years ago) but no details.  She is not anemic.  She is hyponatremic.  Moderately severe lung disease with a question of pulmonary infection currently.  Agree with colonoscopy to evaluate abrupt change in bowels, weight loss, and abnormalities on CT scan.  Obviously, need to rule out mechanical lesion.  Continue to optimize pulmonary status.  She will require more prolonged bowel prep.  Would tentatively plan on colonoscopy day after tomorrow depending upon her overall clinical status and success with a bowel regimen.  We will follow.  Docia Chuck. Geri [place]in[place]., M.D. [facility] Division of Gastroenterology

## 2022-04-24 NOTE — Telephone Encounter (Signed)
Please schedule patient for hospital follow up visit on June 1 or 2 with Dr. Chase Caller or an APP for bronchiectasis and concern of new lung consolidations vs masses on CT scan. She will need CT PET scan ordered at follow up.  Thanks, JD

## 2022-04-24 NOTE — Progress Notes (Signed)
Post void bladder scan 309ML. MD notified.

## 2022-04-24 NOTE — Evaluation (Addendum)
SLP Cancellation Note  Patient Details Name: Catherine Kelley MRN: [id number] DOB: Jun 05, 1951   Cancelled treatment:       Reason Eval/Treat Not Completed: Other (comment) (Swallow eval order received, will follow up next date.  Note GI is seeing pt for constipation and potential stercoral proctitis.  Kathleen Lime, MS [place] Office [phone] Pager [phone] Macario Golds 04/24/2022, 2:49 PM

## 2022-04-24 NOTE — Progress Notes (Signed)
MD Maryland Pink at bedside during this time.

## 2022-04-24 NOTE — ED Notes (Signed)
[facility] on unit to transfer pt to [facility]

## 2022-04-25 ENCOUNTER — Encounter: Payer: Self-pay | Admitting: Physician Assistant

## 2022-04-25 DIAGNOSIS — J479 Bronchiectasis, uncomplicated: Secondary | ICD-10-CM | POA: Diagnosis present

## 2022-04-25 DIAGNOSIS — R339 Retention of urine, unspecified: Secondary | ICD-10-CM | POA: Diagnosis not present

## 2022-04-25 DIAGNOSIS — R1084 Generalized abdominal pain: Secondary | ICD-10-CM

## 2022-04-25 DIAGNOSIS — E861 Hypovolemia: Secondary | ICD-10-CM | POA: Diagnosis present

## 2022-04-25 DIAGNOSIS — R935 Abnormal findings on diagnostic imaging of other abdominal regions, including retroperitoneum: Secondary | ICD-10-CM | POA: Diagnosis not present

## 2022-04-25 DIAGNOSIS — K5909 Other constipation: Secondary | ICD-10-CM | POA: Diagnosis not present

## 2022-04-25 DIAGNOSIS — R0902 Hypoxemia: Secondary | ICD-10-CM | POA: Diagnosis not present

## 2022-04-25 DIAGNOSIS — F319 Bipolar disorder, unspecified: Secondary | ICD-10-CM | POA: Diagnosis present

## 2022-04-25 DIAGNOSIS — R54 Age-related physical debility: Secondary | ICD-10-CM | POA: Diagnosis present

## 2022-04-25 DIAGNOSIS — M549 Dorsalgia, unspecified: Secondary | ICD-10-CM | POA: Diagnosis present

## 2022-04-25 DIAGNOSIS — R194 Change in bowel habit: Secondary | ICD-10-CM | POA: Diagnosis not present

## 2022-04-25 DIAGNOSIS — M899 Disorder of bone, unspecified: Secondary | ICD-10-CM | POA: Diagnosis not present

## 2022-04-25 DIAGNOSIS — I10 Essential (primary) hypertension: Secondary | ICD-10-CM

## 2022-04-25 DIAGNOSIS — E162 Hypoglycemia, unspecified: Secondary | ICD-10-CM | POA: Diagnosis not present

## 2022-04-25 DIAGNOSIS — K6289 Other specified diseases of anus and rectum: Secondary | ICD-10-CM

## 2022-04-25 DIAGNOSIS — E785 Hyperlipidemia, unspecified: Secondary | ICD-10-CM | POA: Diagnosis present

## 2022-04-25 DIAGNOSIS — K5520 Angiodysplasia of colon without hemorrhage: Secondary | ICD-10-CM | POA: Diagnosis present

## 2022-04-25 DIAGNOSIS — G8929 Other chronic pain: Secondary | ICD-10-CM | POA: Diagnosis present

## 2022-04-25 DIAGNOSIS — F17210 Nicotine dependence, cigarettes, uncomplicated: Secondary | ICD-10-CM

## 2022-04-25 DIAGNOSIS — H9190 Unspecified hearing loss, unspecified ear: Secondary | ICD-10-CM | POA: Diagnosis present

## 2022-04-25 DIAGNOSIS — E871 Hypo-osmolality and hyponatremia: Secondary | ICD-10-CM

## 2022-04-25 DIAGNOSIS — K621 Rectal polyp: Secondary | ICD-10-CM | POA: Diagnosis present

## 2022-04-25 DIAGNOSIS — K5900 Constipation, unspecified: Secondary | ICD-10-CM | POA: Diagnosis not present

## 2022-04-25 DIAGNOSIS — D696 Thrombocytopenia, unspecified: Secondary | ICD-10-CM | POA: Diagnosis not present

## 2022-04-25 DIAGNOSIS — J449 Chronic obstructive pulmonary disease, unspecified: Secondary | ICD-10-CM | POA: Diagnosis not present

## 2022-04-25 DIAGNOSIS — C7951 Secondary malignant neoplasm of bone: Secondary | ICD-10-CM

## 2022-04-25 DIAGNOSIS — K219 Gastro-esophageal reflux disease without esophagitis: Secondary | ICD-10-CM | POA: Diagnosis present

## 2022-04-25 DIAGNOSIS — J432 Centrilobular emphysema: Secondary | ICD-10-CM | POA: Diagnosis present

## 2022-04-25 DIAGNOSIS — K5641 Fecal impaction: Secondary | ICD-10-CM

## 2022-04-25 DIAGNOSIS — J690 Pneumonitis due to inhalation of food and vomit: Secondary | ICD-10-CM | POA: Diagnosis not present

## 2022-04-25 DIAGNOSIS — K529 Noninfective gastroenteritis and colitis, unspecified: Secondary | ICD-10-CM | POA: Diagnosis present

## 2022-04-25 DIAGNOSIS — R64 Cachexia: Secondary | ICD-10-CM | POA: Diagnosis present

## 2022-04-25 DIAGNOSIS — E222 Syndrome of inappropriate secretion of antidiuretic hormone: Secondary | ICD-10-CM | POA: Diagnosis present

## 2022-04-25 DIAGNOSIS — R112 Nausea with vomiting, unspecified: Secondary | ICD-10-CM

## 2022-04-25 DIAGNOSIS — R634 Abnormal weight loss: Secondary | ICD-10-CM

## 2022-04-25 DIAGNOSIS — T40605A Adverse effect of unspecified narcotics, initial encounter: Secondary | ICD-10-CM | POA: Diagnosis present

## 2022-04-25 DIAGNOSIS — Z681 Body mass index (BMI) 19 or less, adult: Secondary | ICD-10-CM | POA: Diagnosis not present

## 2022-04-25 DIAGNOSIS — R051 Acute cough: Secondary | ICD-10-CM | POA: Diagnosis not present

## 2022-04-25 LAB — CBC
HCT: 41.5 % (ref 36.0–46.0)
Hemoglobin: 13.3 g/dL (ref 12.0–15.0)
MCH: 30.9 pg (ref 26.0–34.0)
MCHC: 32 g/dL (ref 30.0–36.0)
MCV: 96.3 fL (ref 80.0–100.0)
Platelets: 141 10*3/uL — ABNORMAL LOW (ref 150–400)
RBC: 4.31 MIL/uL (ref 3.87–5.11)
RDW: 14.1 % (ref 11.5–15.5)
WBC: 5.9 10*3/uL (ref 4.0–10.5)
nRBC: 0 % (ref 0.0–0.2)

## 2022-04-25 LAB — COMPREHENSIVE METABOLIC PANEL
ALT: 20 U/L (ref 0–44)
AST: 22 U/L (ref 15–41)
Albumin: 3.1 g/dL — ABNORMAL LOW (ref 3.5–5.0)
Alkaline Phosphatase: 173 U/L — ABNORMAL HIGH (ref 38–126)
Anion gap: 8 (ref 5–15)
BUN: 7 mg/dL — ABNORMAL LOW (ref 8–23)
CO2: 19 mmol/L — ABNORMAL LOW (ref 22–32)
Calcium: 9.3 mg/dL (ref 8.9–10.3)
Chloride: 106 mmol/L (ref 98–111)
Creatinine, Ser: 0.49 mg/dL (ref 0.44–1.00)
GFR, Estimated: >60 mL/min (ref >60)
Glucose, Bld: 81 mg/dL (ref 70–99)
Potassium: 4.3 mmol/L (ref 3.5–5.1)
Sodium: 133 mmol/L — ABNORMAL LOW (ref 135–145)
Total Bilirubin: 0.8 mg/dL (ref 0.3–1.2)
Total Protein: 6.5 g/dL (ref 6.5–8.1)

## 2022-04-25 LAB — T4, FREE: Free T4: 1.12 ng/dL (ref 0.61–1.12)

## 2022-04-25 LAB — PROTIME-INR
INR: 1.1 (ref 0.8–1.2)
Prothrombin Time: 13.8 seconds (ref 11.4–15.2)

## 2022-04-25 MED ORDER — KETOROLAC TROMETHAMINE 15 MG/ML IJ SOLN
15.0000 mg | Freq: Four times a day (QID) | INTRAMUSCULAR | Status: DC | PRN
  Administered 2022-04-25 – 2022-04-29 (×7): 15 mg via INTRAVENOUS
  Filled 2022-04-25 (×8): qty 1

## 2022-04-25 MED ORDER — SODIUM CHLORIDE 0.9 % IV SOLN: INTRAVENOUS | Status: AC

## 2022-04-25 MED ORDER — HYDROCORTISONE ACETATE 25 MG RE SUPP
25.0000 mg | Freq: Two times a day (BID) | RECTAL | Status: DC | PRN
  Filled 2022-04-25: qty 1

## 2022-04-25 MED ORDER — HYDROMORPHONE HCL 1 MG/ML IJ SOLN
1.0000 mg | INTRAMUSCULAR | Status: DC | PRN
  Administered 2022-04-25: 2 mg via INTRAVENOUS
  Administered 2022-04-26 – 2022-04-28 (×3): 1 mg via INTRAVENOUS
  Filled 2022-04-25 (×3): qty 1
  Filled 2022-04-25: qty 2

## 2022-04-25 MED ORDER — POLYETHYLENE GLYCOL 3350 17 GM/SCOOP PO POWD
0.5000 | Freq: Once | ORAL | Status: AC
  Administered 2022-04-25: 127.5 g via ORAL
  Filled 2022-04-25: qty 255

## 2022-04-25 MED ORDER — MORPHINE SULFATE (PF) 2 MG/ML IV SOLN
2.0000 mg | INTRAVENOUS | Status: DC | PRN
  Administered 2022-04-25: 2 mg via INTRAVENOUS
  Filled 2022-04-25: qty 1

## 2022-04-25 MED ORDER — POLYETHYLENE GLYCOL 3350 17 GM/SCOOP PO POWD
0.5000 | Freq: Once | ORAL | Status: AC
Start: 2022-04-25 — End: 2022-04-25
  Administered 2022-04-25: 127.5 g via ORAL
  Filled 2022-04-25: qty 255

## 2022-04-25 MED ORDER — LIDOCAINE 5 % EX PTCH
1.0000 | MEDICATED_PATCH | CUTANEOUS | Status: DC
  Administered 2022-04-25 – 2022-04-27 (×3): 1 via TRANSDERMAL
  Filled 2022-04-25 (×5): qty 1

## 2022-04-25 MED ORDER — BISACODYL 10 MG RE SUPP
10.0000 mg | Freq: Once | RECTAL | Status: AC
  Administered 2022-04-25: 10 mg via RECTAL
  Filled 2022-04-25: qty 1

## 2022-04-25 NOTE — Progress Notes (Signed)
Patient required I/O cath x2 overnight, bladder scan volumes resulted 347ml at 2200 and 475ml at 0500.

## 2022-04-25 NOTE — TOC Initial Note (Signed)
Transition of Care [facility]) - Initial/Assessment Note    Patient Details  Name: Catherine Kelley MRN: [id number] Date of Birth: [birth year]  Transition of Care [facility]) CM/SW Contact:    Bethann Berkshire, [place] Phone Number: 04/25/2022, 1:27 PM  Clinical Narrative:                  Met with pt to discuss PT recs. Pt lives at home in [place] with her husband. She has 2 daughters who live locally but only sees them occasionally. She states she has a walker and 3-in-1 at home; no additional DME needs identified. CSW explained HH recommendation. Pt states she does not HH. She also refuses OPPT. She states she will be fine at home. CSW notified MD. Told pt to notify RN if she changes her mind.   Expected Discharge Plan: Home/Self Care Barriers to Discharge: Continued Medical Work up   Patient Goals and CMS Choice Patient states their goals for this hospitalization and ongoing recovery are:: Return home, does not want [facility] or OPPT      Expected Discharge Plan and Services Expected Discharge Plan: Home/Self Care       Living arrangements for the past 2 months: Single Family Home                                      Prior Living Arrangements/Services Living arrangements for the past 2 months: Single Family Home Lives with:: Spouse Patient language and need for interpreter reviewed:: Yes        Need for Family Participation in Patient Care: No (Comment) Care giver support system in place?: Yes (comment)   Criminal Activity/Legal Involvement Pertinent to Current Situation/Hospitalization: No - Comment as needed  Activities of Daily Living      Permission Sought/Granted   Permission granted to share information with : Yes, Verbal Permission Granted  Share Information with NAME: Battin,Richard H (Spouse)   [phone] (Mobile)           Emotional Assessment Appearance:: Appears stated age Attitude/Demeanor/Rapport: Engaged Affect (typically observed): Accepting Orientation: :  Oriented to Self, Oriented to Place, Oriented to  Time, Oriented to Situation Alcohol / Substance Use: Not Applicable Psych Involvement: No (comment)  Admission diagnosis:  Colitis [K52.9] Hyponatremia [E87.1] Chronic constipation [K59.09] Abnormal CT of the abdomen [R93.5] Metastatic cancer to spine [facility]) [C79.51] Patient Active Problem List   Diagnosis Date Noted   Colitis 04/24/2022   Constipation 04/24/2022   Lesion of bone of thoracic spine 04/24/2022   Bronchiectasis ([place]) 04/24/2022   Abnormal CT of the abdomen    Chronic constipation    Change in bowel habits    Loss of weight    Generalized abdominal pain    Chronic idiopathic constipation 04/23/2022   Impaction, bowel ([place]) 04/23/2022   Vitamin D deficiency 07/06/2020   Cellulitis of right lower extremity 10/22/2019   Cellulitis 10/21/2019   Bipolar 1 disorder ([place]) 10/11/2019   Tobacco dependence due to cigarettes 10/11/2019   Fall as cause of accidental injury at home as place of occurrence 10/11/2019   Traumatic hematoma of knee, right, initial encounter 10/11/2019   Lower respiratory infection 12/22/2018   Senile ecchymosis 09/11/2018   CAP (community acquired pneumonia) 09/17/2017   Near syncope 09/17/2017   Tobacco abuse 02/12/2016   Other fatigue 02/04/2016   Respiratory failure with hypoxia ([place]) 01/09/2016   Malnutrition of moderate degree 11/03/2015  Pulmonary emphysema ([place])    Essential hypertension 09/27/2015   Rhabdomyolysis 08/16/2015   Cough 06/07/2015   Bronchiectasis with acute exacerbation ([place]) 03/07/2015   Cigarette smoker 12/13/2014   Stage 2 moderate COPD by GOLD classification ([place]) 10/11/2014   Lumbar back pain with radiculopathy affecting right lower extremity 09/28/2014   Hyponatremia 07/09/2014   Hx of recurrent pneumonia 07/08/2014   Venous insufficiency (chronic) (peripheral) 09/29/2012   Lower extremity edema 03/09/2012   Benign hypertensive heart disease without heart failure  05/09/2011   Chest pain 05/09/2011   Dyslipidemia 05/09/2011   Mood disorder ([place]) 05/09/2011   Osteoarthritis 05/09/2011   PCP:  Maximiano Coss, NP Pharmacy:   [name]Phone: [phone]: [phone]    Social Determinants of Health (SDOH) Interventions    Readmission Risk Interventions     View : No data to display.

## 2022-04-25 NOTE — Progress Notes (Signed)
Patient able to void 549ml total so far on my shift. 1200 Bladder scan only showed 152ml so we will hold off on foley at this time.

## 2022-04-25 NOTE — Progress Notes (Addendum)
Progress Note  Primary GI: Dr. Henrene Pastor ( unassigned)   Subjective  Chief Complaint:  Colitis, constipation, bone lesions  Patient did MiraLAX bowel purge yesterday.  States still unable to have bowel movement, very little brown liquid stool. Has some nausea denies vomiting.  Has some lower abdominal discomfort. Patient's been having urinary retention, having to have straight catheterizations, pending urinary scan 12 May get Foley catheter.    Objective   Vital signs in last 24 hours: Temp:  [97.9 F (36.6 C)-98.7 F (37.1 C)] 98.1 F (36.7 C) (05/26 0503) Pulse Rate:  [79-106] 79 (05/26 0503) Resp:  [16-18] 18 (05/26 0503) BP: (127-143)/(53-83) 127/53 (05/26 0503) SpO2:  [93 %-100 %] 93 % (05/26 0503)   Last BM recorded by nurses in past 5 days Stool Type: Type 7 (Liquid consistency with no solid pieces) (04/24/2022  3:25 PM)  General:   Thin, chronically ill-appearing female Heart:  Regular rate and rhythm; no murmurs Pulm: Decreased breath sounds, slight wheezing anteriorly Abdomen:  Soft, Obese AB, Hypoactive bowel sounds. mild tenderness in the lower abdomen. Without guarding and Without rebound, No organomegaly appreciated. Rectal: Normal external rectal exam, normal rectal tone, large volume soft light brown stool within the rectum, some harder pieces removed, with further soft pieces removed, patient tolerated fairly well, no blood no internal hemorrhoids appreciated, no masses, tender Extremities:  without  edema. Neurologic:  Alert and  oriented x4;  No focal deficits.  Psych:  Cooperative. Normal mood and affect.  Intake/Output from previous day: 05/25 0701 - 05/26 0700 In: 876.5 [P.O.:355; I.V.:521.5] Out: 1600 [Urine:1600] Intake/Output this shift: Total I/O In: 80 [I.V.:80] Out: 200 [Urine:200]  Studies/Results: DG Abdomen 1 View  Result Date: 04/23/2022 CLINICAL DATA:  Constipation. EXAM: ABDOMEN - 1 VIEW COMPARISON:  None Available. FINDINGS: A moderate  amount of stool is present in the rectum and distal colon. There is mild gaseous distension of the transverse colon. No small bowel dilatation is seen to suggest obstruction. No acute osseous abnormality is evident. IMPRESSION: Moderate amount of stool in the distal colon and rectum. No evidence of bowel obstruction. Electronically Signed   By: Logan Bores M.D.   On: 04/23/2022 18:58   CT CHEST W CONTRAST  Result Date: 04/24/2022 CLINICAL DATA:  Chronic dyspnea, unclear etiology, sclerotic lower thoracic and lumbar spine lesions, primary malignancy search EXAM: CT CHEST WITH CONTRAST TECHNIQUE: Multidetector CT imaging of the chest was performed during intravenous contrast administration. RADIATION DOSE REDUCTION: This exam was performed according to the departmental dose-optimization program which includes automated exposure control, adjustment of the mA and/or kV according to patient size and/or use of iterative reconstruction technique. CONTRAST:  5m OMNIPAQUE IOHEXOL 300 MG/ML  SOLN COMPARISON:  CT abdomen pelvis, 04/23/2022, CT chest, 11/08/2020 FINDINGS: Cardiovascular: Aortic atherosclerosis. Normal heart size. Coronary artery calcifications. No pericardial effusion. Mediastinum/Nodes: Interval enlargement of mediastinal and bilateral hilar lymph nodes, largest pretracheal nodes measuring 1.8 x 1.3 cm, previously 1.1 x 0.9 cm (series 2, image 58). Thyroid gland, trachea, and esophagus demonstrate no significant findings. Lungs/Pleura: Severe centrilobular and paraseptal emphysema. Severe, varicoid and cystic bronchiectasis in the right greater than left bilateral lung bases (series 5, image 107). Slightly worsened associated nodular airspace disease, particularly in the left lung base (series 6, image 112). Significant interval enlargement of a subpleural mass of the posterior right upper lobe, measuring 3.3 x 2.2 cm, previously 1.3 x 1.1 cm (series 6, image 68). There is additional, new masslike  consolidation laterally, measuring  4.5 x 1.9 cm (series 5, image 62). Multiple additional foci of less solid-appearing subpleural ground-glass and consolidative airspace disease within the medial right upper lobe (series 6, image 64) and subpleural left upper lobe (series 5, image 49). No pleural effusion or pneumothorax. Upper Abdomen: No acute abnormality. Musculoskeletal: No chest wall abnormality. Densely sclerotic osseous lesions of the T11 and lower included vertebral bodies, better included by prior CT of the abdomen and pelvis. IMPRESSION: 1. Significant interval enlargement of a subpleural masslike consolidation of the posterior right upper lobe in comparison to prior CT of the chest dated 11/08/2020, highly suspicious for primary lung malignancy. 2. There is additional, new masslike consolidation laterally in the right upper lobe, as well as multiple additional foci of less solid-appearing subpleural ground-glass and consolidative airspace disease within the medial right upper lobe and subpleural left upper lobe. Although multifocal or metastatic malignancy is a general differential consideration, appearance of these additional lesions is more suggestive of airspace disease and chronic sequelae. PET-CT may be helpful for metabolic characterization of all of the above lesions. 3. Interval enlargement of mediastinal and bilateral hilar lymph nodes, possibly reactive although suspicious for nodal metastatic disease. As above, PET-CT may be helpful for metabolic characterization. 4. Severe, varicoid and cystic bronchiectasis in the right greater than left bilateral lung bases, as seen by prior examination. Slightly worsened associated nodular airspace disease, particularly in the left lung base. Findings are consistent with chronic sequelae of aspiration, likely ongoing. 5. Severe emphysema. 6. Coronary artery disease. 7. Densely sclerotic osseous lesions of the T11 and lower included vertebral bodies, better  included by prior CT of the abdomen and pelvis. These remain highly suspicious for osseous metastases, however general differential considerations would include chronic osteomyelitis given the apparently isolated, focal distribution as well as no previous history of treatment to induce post treatment sclerosis. No additional osseous lesions identified in the chest above the T11 vertebral body. Aortic Atherosclerosis (ICD10-I70.0) and Emphysema (ICD10-J43.9). Electronically Signed   By: Delanna Ahmadi M.D.   On: 04/24/2022 13:36   NM Bone Scan Whole Body  Result Date: 04/24/2022 CLINICAL DATA:  Abnormal CT scan, lower back pain, weight loss, elevated alkaline phosphatase, question osseous metastatic disease EXAM: NUCLEAR MEDICINE WHOLE BODY BONE SCAN TECHNIQUE: Whole body anterior and posterior images were obtained approximately 3 hours after intravenous injection of radiopharmaceutical. RADIOPHARMACEUTICALS:  22 mCi Technetium-44mMDP IV COMPARISON:  CT chest 04/24/2022, CT abdomen and pelvis 04/23/2022 FINDINGS: Abnormal increased tracer uptake identified within thoracolumbar spine at approximately T11, T12, L1, and L2 highly suspicious for metastatic disease. These areas correspond to foci of dense sclerosis on CT. Additional small focus of increased tracer uptake within LEFT L3. No additional worrisome sites of tracer accumulation. Distended urinary bladder with retained tracer,. Remaining soft tissue distribution of tracer unremarkable. IMPRESSION: Abnormal increased uptake within thoracolumbar spine at T11-L3 corresponding to sclerotic foci on recent CT most consistent with osseous metastatic disease. Distended urinary bladder, patient unable to empty. Electronically Signed   By: MLavonia DanaM.D.   On: 04/24/2022 14:48   CT ABDOMEN PELVIS W CONTRAST  Result Date: 04/23/2022 CLINICAL DATA:  Constipation, bowel obstruction EXAM: CT ABDOMEN AND PELVIS WITH CONTRAST TECHNIQUE: Multidetector CT imaging of the  abdomen and pelvis was performed using the standard protocol following bolus administration of intravenous contrast. RADIATION DOSE REDUCTION: This exam was performed according to the departmental dose-optimization program which includes automated exposure control, adjustment of the mA and/or kV according to patient size and/or  use of iterative reconstruction technique. CONTRAST:  57m OMNIPAQUE IOHEXOL 300 MG/ML  SOLN COMPARISON:  CT chest 11/08/2020 FINDINGS: Lower chest: There is severe varicoid bronchiectasis, asymmetrically more severe within the right lung base, similar to that noted on prior examination. This may reflect the sequela of recurrent aspiration, mucociliary disorders, or remote and/or recurrent infection. There is interval development of numerous air-fluid levels within the bronchiectatic airways of the visualized right lung base and peribronchial nodularity within the left lower lobe which may reflect changes of superimposed acute infection or aspiration. Spiculated opacities within the peripheral right lung base appears stable since prior examination and are most in keeping with post inflammatory scarring. Cardiac size within normal limits. Hepatobiliary: No focal liver abnormality is seen. No gallstones, gallbladder wall thickening, or biliary dilatation. Pancreas: Unremarkable Spleen: Unremarkable Adrenals/Urinary Tract: Adrenal glands are unremarkable. Kidneys are normal, without renal calculi, focal lesion, or hydronephrosis. Bladder is unremarkable. Stomach/Bowel: Large volume stool seen within the rectal vault. Mild perirectal edema may reflect changes of superimposed stercoral proctitis. Moderate stool seen throughout the remainder of the colon. Stomach, small bowel, and large bowel are otherwise unremarkable. Appendix normal. No free intraperitoneal gas or fluid. Vascular/Lymphatic: Extensive aortoiliac atherosclerotic calcification. No aortic aneurysm. No pathologic adenopathy within  the abdomen and pelvis Reproductive: Uterus and bilateral adnexa are unremarkable. Other: No abdominal wall hernia Musculoskeletal: Sclerotic lesions are seen within the T11, T12, L1, and L2 vertebral bodies concerning for metastatic disease. Lesion within the T11 vertebral body appears new since prior examination and T12 vertebral body has progressed. IMPRESSION: Severe asymmetric varicoid bronchiectasis within the visualized lung bases. See discussion above. Interval development of peribronchial nodularity within the left lung base and numerous air-fluid levels within the bronchiectatic airways of the right lung base in keeping with superimposed infection or aspiration. Stable spiculated opacities within the right lung base most in keeping with post inflammatory change. Large volume stool within the rectal vault with perirectal edema suggesting changes of stercoral proctitis. Superimposed moderate stool burden throughout the colon. Progressive sclerotic lesions within the visualized thoracolumbar spine suspicious for progressive osseous metastatic disease. Correlation for history of primary malignancy is recommended. Aortic Atherosclerosis (ICD10-I70.0). Electronically Signed   By: AFidela SalisburyM.D.   On: 04/23/2022 20:19    Lab Results: Recent Labs    04/23/22 1846 04/25/22 0621  WBC 10.7* 5.9  HGB 14.0 13.3  HCT 44.5 41.5  PLT 187 141*   BMET Recent Labs    04/23/22 1846 04/25/22 0621  NA 127* 133*  K 4.6 4.3  CL 95* 106  CO2 24 19*  GLUCOSE 86 81  BUN 9 7*  CREATININE 0.57 0.49  CALCIUM 10.1 9.3   LFT Recent Labs    04/25/22 0621  PROT 6.5  ALBUMIN 3.1*  AST 22  ALT 20  ALKPHOS 173*  BILITOT 0.8   PT/INR Recent Labs    04/25/22 0621  LABPROT 13.8  INR 1.1      Patient profile:   71year old female with bronchiectasis/emphysema COPD currently requiring 2 L nasal cannula, presenting with acute onset constipation and abdominal pain, hydrocodone use stopped 2 weeks  ago. Found to have large volume stool on CT with possible stercoral proctitis with rectal wall thickening, also found to have possible worsening sclerotic bone lesions thoracic spine. No anemia, last colon 2010 with Dr. MCollene Mares with rectal thickening, acute constipation changes, and bone lesions will want to rule out colorectal cancer.  Currently having urinary retention, possibly due to constipation, status  post fecal disimpaction   Impression/Plan:   Constipation/ Stercoral proctitis CT AB   Large volume stool within the rectal vault with perirectal edema suggesting changes of stercoral proctitis.  Superimposed moderate stool burden throughout the colon. Previously on hydrocodone stopped 2 to 3 weeks ago. Per Dr. Collene Mares last colon was 10/10/09 and was normal, never had recall colonoscopy, No anemia  Most likely from opioids, however with last colonoscopy being over 10 years ago, rectal thickening acute constipation and bone lesions plan for colonoscopy when able -Fecal disimpaction manually today in the room, mixed hard and soft light brown stool -We will not be prepared for colonoscopy tomorrow, will continue clear liquids today and continue bowel prep today, full bowel prep tomorrow with possible colonoscopy Sunday. -We will plan for Dulcolax suppository, enema today. We have discussed the risks of bleeding, infection, perforation, medication reactions, and remote risk of death associated with colonoscopy. All questions were answered and the patient acknowledges these risk and wishes to proceed.   Thoracic spine bone lesions with elevated alk phos AST 22 ALT 20  Alkphos 173 Normal liver function otherwise Alk phos likely due to bone lesions, unknown primary Positive NM bone scan T11-L3   Bronchiectasis Follows with Dr. Chase Caller   Stage II COPD with likely exacerbation  Not on oxygen while is in the room with her, no coughing. CT abdomen pelvis with contrast showed severe bronchiectasis  possible superimposed infection or aspiration versus malignancy? Per CCM may need CT PET  Hyponatremia  Improved  Urinary retention Status post manual disimpaction in the room today, will see if this potentially helps with urination.  If not, patient is getting bladder scan at 12 per nurse and will get Foley catheter if still retaining.   LOS: 0 days   Vladimir Crofts  04/25/2022, 8:47 AM  GI ATTENDING  Interval history data reviewed.  Patient seen and examined.  Agree with interval progress note.  Disimpacted by PA.  Today.  Continues to have colonic purge.  Pulmonary status stable.  Hyponatremia corrected.  Agree, that she should be able to have a standard bowel prep tomorrow for anticipated colonoscopy Sunday.  Dr. Benson Norway is covering for GI this weekend.  Docia Chuck. Geri [place]., M.D. [facility] of Gastroenterology

## 2022-04-25 NOTE — Progress Notes (Signed)
[facility] PROGRESS NOTE   Catherine Kelley ZHG:[id number] DOB: 1951-09-03 DOA: 04/23/2022  PCP: Maximiano Coss, NP  Brief History/Interval Summary: 71 y.o. female with past medical history of essential hypertension, bronchiectasis, bipolar disorder, chronic back pain who has had multiple visits to the emergency department and her PCP for constipation over the last few weeks.  She presented again with similar issues.  Also reported a weight loss of 10 pounds.  CT scan revealed progressive sclerotic lesions in her vertebrae.  She was hospitalized for further management.  Consultants: Gastroenterology.  Pulmonology.  Procedures: None yet    Subjective/Interval History: Patient is hard of hearing.  She mentions that she has had difficulty urinating overnight.  She had to be in and out cath a few times.  Denies any abdominal pain per se.  Still reports constipation.  No nausea or vomiting.  Back pain is stable.    Assessment/Plan:  Severe constipation Disimpaction, laxatives stool softeners ordered.  TSH noted to be mildly elevated at 6.18.  Free T4 is pending. Gastroenterology was consulted due to relatively sudden onset of constipation.  Looks like plan is for colonoscopy after bowel prep.  Has not had much bowel movements in the last 24 hours.   She is noted to be on Senokot on a scheduled basis.  She is on Dulcolax suppository as needed.  She received half a container of MiraLAX yesterday.  She will likely need more prep.  Will defer to gastroenterology.  Vertebral lesions, sclerotic Multiple lesions noted in the thoracic and lumbar vertebrae.  These are sclerotic as per CT report.  Noted previously in 2021 as well but they appear to have progressed.  It does not appear the patient has had any work-up for these.  She tells me that she is up-to-date on her mammograms.  Her last mammogram in our system was in 2021 which was unremarkable.  She mentions that she is due for another  mammogram this year. Her calcium level noted to be normal along with normal albumin. CT scan of the chest does raise concern for lung masses.  See below. Bone scan also revealed sclerotic lesions from T11-L3 raising concern for osseous metastatic process. She will need further work-up for these but can be pursued in the outpatient setting once her acute GI issues have been dealt with.   History of bronchiectasis/COPD It appears that she was previously followed by Dr. Chase Caller.  Has not seen them in several years.   CT of the chest reveals significant bronchiectasis with concern for lung masses. Seen by pulmonology.  They have prescribed nebulizer treatments.  They recommend outpatient evaluation of the masses in the next week or so.  She will also need a PET scan.  They are working on getting her an appointment to see Dr. Chase Caller.   Has had a few COPD exacerbations in the past few months.  Has been on prednisone a few times.  Continues to smoke cigarettes.  Tobacco abuse Counseled.  Nicotine patch  Alcohol abuse Drinks 3 times a week.  Monitor for signs and symptoms of withdrawal.  Hyponatremia Probably due to hypovolemia with some component of SIADH considering her significant bronchiectases.  Urine osmolality was noted to be elevated.  She was given IV fluids with improvement in sodium to 133.  Continue to monitor.     Bipolar disorder Followed by psychiatry.  Noted to be on lithium, nortriptyline and venlafaxine.  Lithium level was 1.2.  Urinary retention Likely due to constipation  and immobility.  She has allergy to sulfa drugs with cross-reactivity.  We will hold off on prescribing Flomax.  Will likely need to place a Foley catheter.  Discussed with nursing staff.  DVT Prophylaxis: Lovenox Code Status: Full code Family Communication: Attempted to reach her daughter yesterday were not successful.  Patient wants me to call her husband instead. Disposition Plan: Hopefully return  home once GI work-up is completed and once her constipation has resolved.  Status is: Observation The patient will require care spanning > 2 midnights and should be moved to inpatient because: Significant constipation requiring long bowel prep for colonoscopy.      Medications: Scheduled:  amLODipine  2.5 mg Oral Daily   arformoterol  15 mcg Nebulization BID   enoxaparin (LOVENOX) injection  40 mg Subcutaneous Daily   feeding supplement  1 Container Oral TID BM   fluticasone  2 spray Each Nare Daily   lithium carbonate  300 mg Oral QPM   lithium carbonate  600 mg Oral Daily   loratadine  10 mg Oral Daily   nicotine  14 mg Transdermal Daily   nortriptyline  50 mg Oral QHS   revefenacin  175 mcg Nebulization Daily   senna  2 tablet Oral BID   venlafaxine XR  75 mg Oral Q breakfast   Continuous:  sodium chloride 75 mL/hr at 04/24/22 1459   PJA:[id number] **OR** acetaminophen, albuterol, bisacodyl, ondansetron **OR** ondansetron (ZOFRAN) IV  Antibiotics: Anti-infectives (From admission, onward)    Start     Dose/Rate Route Frequency Ordered Stop   04/24/22 0000  amoxicillin-clavulanate (AUGMENTIN) 875-125 MG tablet  Status:  Discontinued        1 tablet Oral Every 12 hours 04/24/22 0020 04/24/22        Objective:  Vital Signs  Vitals:   04/24/22 1317 04/24/22 1719 04/24/22 2235 04/25/22 0503  BP: 131/68 (!) 143/83 (!) 141/64 (!) 127/53  Pulse: 84 (!) 106 88 79  Resp: 16 16 18 18   Temp: 98.7 F (37.1 C) 97.9 F (36.6 C) 98.3 F (36.8 C) 98.1 F (36.7 C)  TempSrc: Oral Oral Oral Oral  SpO2: 100% 100% 98% 93%  Weight:      Height:        Intake/Output Summary (Last 24 hours) at 04/25/2022 0841 Last data filed at 04/25/2022 0804 Gross per 24 hour  Intake 956.46 ml  Output 1800 ml  Net -843.54 ml   Filed Weights   04/23/22 1511  Weight: 45.8 kg    General appearance: Awake alert.  In no distress Resp: Clear to auscultation bilaterally.  Normal  effort Cardio: S1-S2 is normal regular.  No S3-S4.  No rubs murmurs or bruit GI: Abdomen is soft.  Nontender nondistended.  Bowel sounds are present normal.  No masses organomegaly Extremities: No edema.  Full range of motion of lower extremities. Neurologic: Alert and oriented x3.  No focal neurological deficits.    Lab Results:  Data Reviewed: I have personally reviewed following labs and reports of the imaging studies  CBC: Recent Labs  Lab 04/23/22 1846 04/25/22 0621  WBC 10.7* 5.9  NEUTROABS 9.0*  --   HGB 14.0 13.3  HCT 44.5 41.5  MCV 94.1 96.3  PLT 187 141*    Basic Metabolic Panel: Recent Labs  Lab 04/23/22 1846 04/25/22 0621  NA 127* 133*  K 4.6 4.3  CL 95* 106  CO2 24 19*  GLUCOSE 86 81  BUN 9 7*  CREATININE 0.57 0.49  CALCIUM 10.1 9.3    GFR: Estimated Creatinine Clearance: 46.6 mL/min (by C-G formula based on SCr of 0.49 mg/dL).  Liver Function Tests: Recent Labs  Lab 04/23/22 1846 04/25/22 0621  AST 28 22  ALT 22 20  ALKPHOS 209* 173*  BILITOT 0.6 0.8  PROT 7.6 6.5  ALBUMIN 3.7 3.1*    Recent Labs  Lab 04/23/22 1846  LIPASE 29    Coagulation Profile: Recent Labs  Lab 04/25/22 0621  INR 1.1   Thyroid Function Tests: Recent Labs    04/24/22 1020  TSH 6.181*     Radiology Studies: DG Abdomen 1 View  Result Date: 04/23/2022 CLINICAL DATA:  Constipation. EXAM: ABDOMEN - 1 VIEW COMPARISON:  None Available. FINDINGS: A moderate amount of stool is present in the rectum and distal colon. There is mild gaseous distension of the transverse colon. No small bowel dilatation is seen to suggest obstruction. No acute osseous abnormality is evident. IMPRESSION: Moderate amount of stool in the distal colon and rectum. No evidence of bowel obstruction. Electronically Signed   By: Logan Bores M.D.   On: 04/23/2022 18:58   CT CHEST W CONTRAST  Result Date: 04/24/2022 CLINICAL DATA:  Chronic dyspnea, unclear etiology, sclerotic lower thoracic  and lumbar spine lesions, primary malignancy search EXAM: CT CHEST WITH CONTRAST TECHNIQUE: Multidetector CT imaging of the chest was performed during intravenous contrast administration. RADIATION DOSE REDUCTION: This exam was performed according to the departmental dose-optimization program which includes automated exposure control, adjustment of the mA and/or kV according to patient size and/or use of iterative reconstruction technique. CONTRAST:  67mL OMNIPAQUE IOHEXOL 300 MG/ML  SOLN COMPARISON:  CT abdomen pelvis, 04/23/2022, CT chest, 11/08/2020 FINDINGS: Cardiovascular: Aortic atherosclerosis. Normal heart size. Coronary artery calcifications. No pericardial effusion. Mediastinum/Nodes: Interval enlargement of mediastinal and bilateral hilar lymph nodes, largest pretracheal nodes measuring 1.8 x 1.3 cm, previously 1.1 x 0.9 cm (series 2, image 58). Thyroid gland, trachea, and esophagus demonstrate no significant findings. Lungs/Pleura: Severe centrilobular and paraseptal emphysema. Severe, varicoid and cystic bronchiectasis in the right greater than left bilateral lung bases (series 5, image 107). Slightly worsened associated nodular airspace disease, particularly in the left lung base (series 6, image 112). Significant interval enlargement of a subpleural mass of the posterior right upper lobe, measuring 3.3 x 2.2 cm, previously 1.3 x 1.1 cm (series 6, image 68). There is additional, new masslike consolidation laterally, measuring 4.5 x 1.9 cm (series 5, image 62). Multiple additional foci of less solid-appearing subpleural ground-glass and consolidative airspace disease within the medial right upper lobe (series 6, image 64) and subpleural left upper lobe (series 5, image 49). No pleural effusion or pneumothorax. Upper Abdomen: No acute abnormality. Musculoskeletal: No chest wall abnormality. Densely sclerotic osseous lesions of the T11 and lower included vertebral bodies, better included by prior CT of  the abdomen and pelvis. IMPRESSION: 1. Significant interval enlargement of a subpleural masslike consolidation of the posterior right upper lobe in comparison to prior CT of the chest dated 11/08/2020, highly suspicious for primary lung malignancy. 2. There is additional, new masslike consolidation laterally in the right upper lobe, as well as multiple additional foci of less solid-appearing subpleural ground-glass and consolidative airspace disease within the medial right upper lobe and subpleural left upper lobe. Although multifocal or metastatic malignancy is a general differential consideration, appearance of these additional lesions is more suggestive of airspace disease and chronic sequelae. PET-CT may be helpful for metabolic characterization of all of the above lesions.  3. Interval enlargement of mediastinal and bilateral hilar lymph nodes, possibly reactive although suspicious for nodal metastatic disease. As above, PET-CT may be helpful for metabolic characterization. 4. Severe, varicoid and cystic bronchiectasis in the right greater than left bilateral lung bases, as seen by prior examination. Slightly worsened associated nodular airspace disease, particularly in the left lung base. Findings are consistent with chronic sequelae of aspiration, likely ongoing. 5. Severe emphysema. 6. Coronary artery disease. 7. Densely sclerotic osseous lesions of the T11 and lower included vertebral bodies, better included by prior CT of the abdomen and pelvis. These remain highly suspicious for osseous metastases, however general differential considerations would include chronic osteomyelitis given the apparently isolated, focal distribution as well as no previous history of treatment to induce post treatment sclerosis. No additional osseous lesions identified in the chest above the T11 vertebral body. Aortic Atherosclerosis (ICD10-I70.0) and Emphysema (ICD10-J43.9). Electronically Signed   By: Delanna Ahmadi M.D.   On:  04/24/2022 13:36   NM Bone Scan Whole Body  Result Date: 04/24/2022 CLINICAL DATA:  Abnormal CT scan, lower back pain, weight loss, elevated alkaline phosphatase, question osseous metastatic disease EXAM: NUCLEAR MEDICINE WHOLE BODY BONE SCAN TECHNIQUE: Whole body anterior and posterior images were obtained approximately 3 hours after intravenous injection of radiopharmaceutical. RADIOPHARMACEUTICALS:  22 mCi Technetium-82m MDP IV COMPARISON:  CT chest 04/24/2022, CT abdomen and pelvis 04/23/2022 FINDINGS: Abnormal increased tracer uptake identified within thoracolumbar spine at approximately T11, T12, L1, and L2 highly suspicious for metastatic disease. These areas correspond to foci of dense sclerosis on CT. Additional small focus of increased tracer uptake within LEFT L3. No additional worrisome sites of tracer accumulation. Distended urinary bladder with retained tracer,. Remaining soft tissue distribution of tracer unremarkable. IMPRESSION: Abnormal increased uptake within thoracolumbar spine at T11-L3 corresponding to sclerotic foci on recent CT most consistent with osseous metastatic disease. Distended urinary bladder, patient unable to empty. Electronically Signed   By: Lavonia Dana M.D.   On: 04/24/2022 14:48   CT ABDOMEN PELVIS W CONTRAST  Result Date: 04/23/2022 CLINICAL DATA:  Constipation, bowel obstruction EXAM: CT ABDOMEN AND PELVIS WITH CONTRAST TECHNIQUE: Multidetector CT imaging of the abdomen and pelvis was performed using the standard protocol following bolus administration of intravenous contrast. RADIATION DOSE REDUCTION: This exam was performed according to the departmental dose-optimization program which includes automated exposure control, adjustment of the mA and/or kV according to patient size and/or use of iterative reconstruction technique. CONTRAST:  55mL OMNIPAQUE IOHEXOL 300 MG/ML  SOLN COMPARISON:  CT chest 11/08/2020 FINDINGS: Lower chest: There is severe varicoid  bronchiectasis, asymmetrically more severe within the right lung base, similar to that noted on prior examination. This may reflect the sequela of recurrent aspiration, mucociliary disorders, or remote and/or recurrent infection. There is interval development of numerous air-fluid levels within the bronchiectatic airways of the visualized right lung base and peribronchial nodularity within the left lower lobe which may reflect changes of superimposed acute infection or aspiration. Spiculated opacities within the peripheral right lung base appears stable since prior examination and are most in keeping with post inflammatory scarring. Cardiac size within normal limits. Hepatobiliary: No focal liver abnormality is seen. No gallstones, gallbladder wall thickening, or biliary dilatation. Pancreas: Unremarkable Spleen: Unremarkable Adrenals/Urinary Tract: Adrenal glands are unremarkable. Kidneys are normal, without renal calculi, focal lesion, or hydronephrosis. Bladder is unremarkable. Stomach/Bowel: Large volume stool seen within the rectal vault. Mild perirectal edema may reflect changes of superimposed stercoral proctitis. Moderate stool seen throughout the remainder of  the colon. Stomach, small bowel, and large bowel are otherwise unremarkable. Appendix normal. No free intraperitoneal gas or fluid. Vascular/Lymphatic: Extensive aortoiliac atherosclerotic calcification. No aortic aneurysm. No pathologic adenopathy within the abdomen and pelvis Reproductive: Uterus and bilateral adnexa are unremarkable. Other: No abdominal wall hernia Musculoskeletal: Sclerotic lesions are seen within the T11, T12, L1, and L2 vertebral bodies concerning for metastatic disease. Lesion within the T11 vertebral body appears new since prior examination and T12 vertebral body has progressed. IMPRESSION: Severe asymmetric varicoid bronchiectasis within the visualized lung bases. See discussion above. Interval development of peribronchial  nodularity within the left lung base and numerous air-fluid levels within the bronchiectatic airways of the right lung base in keeping with superimposed infection or aspiration. Stable spiculated opacities within the right lung base most in keeping with post inflammatory change. Large volume stool within the rectal vault with perirectal edema suggesting changes of stercoral proctitis. Superimposed moderate stool burden throughout the colon. Progressive sclerotic lesions within the visualized thoracolumbar spine suspicious for progressive osseous metastatic disease. Correlation for history of primary malignancy is recommended. Aortic Atherosclerosis (ICD10-I70.0). Electronically Signed   By: Fidela Salisbury M.D.   On: 04/23/2022 20:19       LOS: 0 days   [facility] Pager on [email or web address]  04/25/2022, 8:41 AM

## 2022-04-25 NOTE — Evaluation (Signed)
Occupational Therapy Evaluation Patient Details Name: Catherine Kelley MRN: [id number] DOB: 1951/06/26 Today's Date: 04/25/2022   History of Present Illness 71 y.o. female with past medical history of essential hypertension, bronchiectasis, bipolar disorder, chronic back pain who has had multiple visits to the emergency department and her PCP for constipation over the last few weeks.  She presented again with similar issues.  Also reported a weight loss of 10 pounds.  CT scan revealed progressive sclerotic lesions in her vertebrae.   Clinical Impression   PTA patient reports independent with ADLs, using RW as needed for mobility. Admitted for above and presents with problem list below, including impaired balance, decreased activity tolerance and generalized weakness. Pt oriented, following simple commands and requires cueing for safety/problem solving.  She is able to complete ADLs with supervision to mod assist, min guard assist for transfers and mobility using RW.  Based on performance today, pt will benefit from further OT service while admitted and after dc at [facility] level to optimize independence, safety and return to PLOF. Will follow.      Recommendations for follow up therapy are one component of a multi-disciplinary discharge planning process, led by the attending physician.  Recommendations may be updated based on patient status, additional functional criteria and insurance authorization.   Follow Up Recommendations  Home health OT    Assistance Recommended at Discharge Frequent or constant Supervision/Assistance  Patient can return home with the following A little help with walking and/or transfers;A little help with bathing/dressing/bathroom;Assistance with cooking/housework;Direct supervision/assist for medications management;Direct supervision/assist for financial management;Assist for transportation;Help with stairs or ramp for entrance    Functional Status Assessment  Patient has had a  recent decline in their functional status and demonstrates the ability to make significant improvements in function in a reasonable and predictable amount of time.  Equipment Recommendations  BSC/3in1    Recommendations for Other Services       Precautions / Restrictions Precautions Precautions: Fall Precaution Comments: pt reports h/o 1 fall in past 6 months but is unable to recall details Restrictions Weight Bearing Restrictions: No      Mobility Bed Mobility Overal bed mobility: Modified Independent             General bed mobility comments: used bedrail    Transfers                          Balance Overall balance assessment: Needs assistance, History of Falls Sitting-balance support: Feet supported, No upper extremity supported Sitting balance-Leahy Scale: Good     Standing balance support: Bilateral upper extremity supported, During functional activity Standing balance-Leahy Scale: Poor Standing balance comment: relies on BUE support                           ADL either performed or assessed with clinical judgement   ADL Overall ADL's : Needs assistance/impaired     Grooming: Set up;Sitting           Upper Body Dressing : Set up;Sitting   Lower Body Dressing: Sit to/from stand;Moderate assistance   Toilet Transfer: Ambulation;Rolling walker (2 wheels);Min guard           Functional mobility during ADLs: Minimal assistance;Rolling walker (2 wheels)       Vision   Vision Assessment?: No apparent visual deficits     Perception     Praxis      Pertinent Vitals/Pain Pain  Assessment Pain Assessment: Faces Faces Pain Scale: Hurts little more Pain Location: abdomen Pain Descriptors / Indicators: Discomfort Pain Intervention(s): Limited activity within patient's tolerance, Monitored during session, Repositioned     Hand Dominance Right   Extremity/Trunk Assessment Upper Extremity Assessment Upper Extremity  Assessment: Generalized weakness   Lower Extremity Assessment Lower Extremity Assessment: Defer to PT evaluation RLE Deficits / Details: knee ext -4/5 RLE Sensation: WNL RLE Coordination: WNL LLE Deficits / Details: knee ext +4/5 LLE Sensation: WNL LLE Coordination: WNL   Cervical / Trunk Assessment Cervical / Trunk Assessment: Normal   Communication Communication Communication: No difficulties   Cognition Arousal/Alertness: Awake/alert Behavior During Therapy: WFL for tasks assessed/performed Overall Cognitive Status: No family/caregiver present to determine baseline cognitive functioning                                 General Comments: pt oriented, follows simple commands with slow processing and cueing for problem solving     General Comments       Exercises     Shoulder Instructions      Home Living Family/patient expects to be discharged to:: Private residence Living Arrangements: Spouse/significant other Available Help at Discharge: Family Type of Home: Mobile home Home Access: Stairs to enter Technical brewer of Steps: 4 Entrance Stairs-Rails: Can reach both Home Layout: One level     Bathroom Shower/Tub: Occupational psychologist: Standard Bathroom Accessibility: Yes   Home Equipment: Conservation officer, nature (2 wheels);Cane - single point;Shower seat;Grab bars - toilet;Grab bars - tub/shower          Prior Functioning/Environment Prior Level of Function : Independent/Modified Independent             Mobility Comments: uses RW for mobility as needed ADLs Comments: independent ADLs, limited iADLs, does not drive        OT Problem List: Decreased strength;Decreased activity tolerance;Impaired balance (sitting and/or standing);Decreased safety awareness;Decreased knowledge of use of DME or AE;Decreased knowledge of precautions      OT Treatment/Interventions: Self-care/ADL training;DME and/or AE instruction;Energy  conservation;Therapeutic exercise;Therapeutic activities;Patient/family education;Balance training    OT Goals(Current goals can be found in the care plan section) Acute Rehab OT Goals Patient Stated Goal: home OT Goal Formulation: With patient Time For Goal Achievement: 05/09/22 Potential to Achieve Goals: Good  OT Frequency: Min 2X/week    Co-evaluation PT/OT/SLP Co-Evaluation/Treatment: Yes Reason for Co-Treatment: For patient/therapist safety;To address functional/ADL transfers PT goals addressed during session: Mobility/safety with mobility;Balance;Proper use of DME OT goals addressed during session: ADL's and self-care      AM-PAC OT "6 Clicks" Daily Activity     Outcome Measure Help from another person eating meals?: A Little Help from another person taking care of personal grooming?: A Little Help from another person toileting, which includes using toliet, bedpan, or urinal?: A Little Help from another person bathing (including washing, rinsing, drying)?: A Little Help from another person to put on and taking off regular upper body clothing?: A Lot Help from another person to put on and taking off regular lower body clothing?: A Little 6 Click Score: 17   End of Session Equipment Utilized During Treatment: Rolling walker (2 wheels) Nurse Communication: Mobility status  Activity Tolerance: Patient tolerated treatment well Patient left: in chair;with call bell/phone within reach;with chair alarm set  OT Visit Diagnosis: Other abnormalities of gait and mobility (R26.89);Muscle weakness (generalized) (M62.81)  Time: 5784-6962 OT Time Calculation (min): 15 min Charges:  OT General Charges $OT Visit: 1 Visit OT Evaluation $OT Eval Low Complexity: 1 Low  Jolaine Artist, OT Acute Rehabilitation Services Pager [phone] Office [phone]   Delight Stare 04/25/2022, 12:40 PM

## 2022-04-25 NOTE — Evaluation (Signed)
Clinical/Bedside Swallow Evaluation Patient Details  Name: Catherine Kelley MRN: [id number] Date of Birth: 03-15-1951  Today's Date: 04/25/2022 Time: SLP Start Time (ACUTE ONLY): 1140 SLP Stop Time (ACUTE ONLY): 1155 SLP Time Calculation (min) (ACUTE ONLY): 15 min  Past Medical History:  Past Medical History:  Diagnosis Date   Anxiety    Arthritis    "jooints" (09/18/2017)   Asthma    Bipolar disorder ([place])    Bipolar I disorder ([place]) 10/11/2019   CAP (community acquired pneumonia) 09/18/2017   Cat allergies    Chronic bronchitis ([place])    "get it alot; maybe not q yr" (07/06/2014)   Chronic lower back pain    "spurs and pinched nerves" (09/18/2017)   COPD (chronic obstructive pulmonary disease) ([place])    DDD (degenerative disc disease), lumbar    Depression    psychiatrist Dr. Toy Care   Diastolic dysfunction    Emphysematous COPD ([place])    changes in right base along with patchy areas on last x-ray   Fall as cause of accidental injury at home as place of occurrence 10/11/2019   Fall involving wheelchair as cause of accidental injury 10/11/2019   Falls frequently    "several in 2017; fell at dr's office yesterday" (09/18/2017)   GERD (gastroesophageal reflux disease)    HCAP (healthcare-associated pneumonia) 02/12/2016   Heart murmur    History of cardiovascular stress test 08/15/2004   EF of 70% -- Normal stress cardiolite.  There is no evidence of ischemia and there is normal LV function. -- [name]. MD   History of echocardiogram 12/24/2006   Est. EF of 97-67% NORMAL LV SYSTOLIC FUNCTION WITH IMPAIRED RELAXATION -- MILD AORTIC SCLEROSIS -- NORMAL PALONARY ARTERY PRESSURE -- NO OLD ECHOS FOR COMPARISON -- [name], MD   Hypertension    essential hypertension   Migraine    "when I was having my periods; none since then" (09/18/2017)   Necrotic pneumonia ([place])    recurrent/notes 02/12/2016   Pneumonia    "several times since May 2015" (07/06/2014)   Pollen allergies     Tobacco abuse    smoking about 1/2 pk of cigarettes a day   Tobacco dependence due to cigarettes 10/11/2019   Traumatic hematoma of knee, right, initial encounter 10/11/2019   Past Surgical History:  Past Surgical History:  Procedure Laterality Date   CAROTID STENT     DILATION AND CURETTAGE OF UTERUS     TUBAL LIGATION  1986   HPI:  71yo female admitted 04/23/22 with constipation. PMH: essential HTN, bronchiectasis, bipolar disorder, chronic back pain, COPD, depression, GERD, HCAP, PNA, multiple visits to ED and PCP for constipation x last few weeks.    Assessment / Plan / Recommendation  Clinical Impression  Pt seen for limited swallow assessment at bedside. Pt is currently limited to clear liquids due to GI/bowel issues. RN provided consent for PO intake of clear liquids, and reported pt had no difficulty with multiple medications taken with water. Pt was seated upright in bed with clear liquid lunch tray set up in front of her. Pt reports no difficulty swallowing. She is edentulous, and her dentures are at home. CN exam is unremarkable. Voice quality is clear. Pt accepted trials of thin liquid via cup and straw, taking multiple boluses (>3 oz) in succession without cough response. Pt will be having GI procedures over the weekend. SLP will follow up next week regarding need for instrumental study, given recent Chest CT results. Of note, pt  had MBS in 2016 which revealed no penetration or aspiration.  SLP Visit Diagnosis: Dysphagia, unspecified (R13.10)    Aspiration Risk  Mild aspiration risk    Diet Recommendation Other (Comment) (per MD)   Liquid Administration via: Cup;Straw Medication Administration: Whole meds with liquid Supervision: Patient able to self feed Compensations: Small sips/bites;Slow rate Postural Changes: Seated upright at 90 degrees;Remain upright for at least 30 minutes after po intake    Other  Recommendations Oral Care Recommendations: Oral care BID     Recommendations for follow up therapy are one component of a multi-disciplinary discharge planning process, led by the attending physician.  Recommendations may be updated based on patient status, additional functional criteria and insurance authorization.  Follow up Recommendations Other (comment) (TBD)      Assistance Recommended at Discharge Other (comment) (TBD)  Functional Status Assessment Patient has not had a recent decline in their functional status  Frequency and Duration min 1 x/week  1 week       Prognosis Prognosis for Safe Diet Advancement: Good      Swallow Study   General Date of Onset: 04/23/22 HPI: 71yo female admitted 04/23/22 with constipation. PMH: essential HTN, bronchiectasis, bipolar disorder, chronic back pain, COPD, depression, GERD, HCAP, PNA, multiple visits to ED and PCP for constipation x last few weeks. Type of Study: Bedside Swallow Evaluation Previous Swallow Assessment: MBS 11/04/2015 Diet Prior to this Study: Other (Comment) (clear liquids) Temperature Spikes Noted: No Respiratory Status: Room air History of Recent Intubation: No Behavior/Cognition: Alert;Cooperative;Pleasant mood Oral Cavity Assessment: Within Functional Limits Oral Care Completed by SLP: No Oral Cavity - Dentition: Edentulous;Dentures, not available Vision: Functional for self-feeding Self-Feeding Abilities: Able to feed self Patient Positioning: Upright in bed Baseline Vocal Quality: Normal Volitional Cough: Strong Volitional Swallow: Able to elicit    Oral/Motor/Sensory Function Overall Oral Motor/Sensory Function: Within functional limits   Ice Chips Ice chips: Not tested   Thin Liquid Thin Liquid: Within functional limits Presentation: Cup;Straw    Nectar Thick Nectar Thick Liquid: Not tested   Honey Thick Honey Thick Liquid: Not tested   Puree Puree: Not tested   Solid     Solid: Not tested     Anallely Rosell B. Quentin Ore, [facility], [place] Speech Language  Pathologist Office: [phone]  Shonna Chock 04/25/2022,12:01 PM

## 2022-04-25 NOTE — Evaluation (Signed)
Physical Therapy Evaluation Patient Details Name: ZAKYLA TONCHE MRN: [id number] DOB: 1950/12/21 Today's Date: 04/25/2022  History of Present Illness  71 y.o. female with past medical history of essential hypertension, bronchiectasis, bipolar disorder, chronic back pain who has had multiple visits to the emergency department and her PCP for constipation over the last few weeks.  She presented again with similar issues.  Also reported a weight loss of 10 pounds.  CT scan revealed progressive sclerotic lesions in her vertebrae.  Clinical Impression  Pt admitted with above diagnosis. Pt ambulated 6' x 2 with RW, distance limited by fatigue. SaO2 93% on room air at rest, 90% on room air with walking.  Pt currently with functional limitations due to the deficits listed below (see PT Problem List). Pt will benefit from skilled PT to increase their independence and safety with mobility to allow discharge to the venue listed below.          Recommendations for follow up therapy are one component of a multi-disciplinary discharge planning process, led by the attending physician.  Recommendations may be updated based on patient status, additional functional criteria and insurance authorization.  Follow Up Recommendations Home health PT    Assistance Recommended at Discharge    Patient can return home with the following  A little help with walking and/or transfers;A little help with bathing/dressing/bathroom;Assistance with cooking/housework;Direct supervision/assist for medications management;Assist for transportation;Help with stairs or ramp for entrance    Equipment Recommendations None recommended by PT  Recommendations for Other Services       Functional Status Assessment Patient has had a recent decline in their functional status and demonstrates the ability to make significant improvements in function in a reasonable and predictable amount of time.     Precautions / Restrictions  Precautions Precautions: Fall Precaution Comments: pt reports h/o 1 fall in past 6 months but is unable to recall details Restrictions Weight Bearing Restrictions: No      Mobility  Bed Mobility Overal bed mobility: Modified Independent             General bed mobility comments: used bedrail    Transfers Overall transfer level: Needs assistance Equipment used: Rolling walker (2 wheels) Transfers: Sit to/from Stand Sit to Stand: Min guard           General transfer comment: VCs for hand placement    Ambulation/Gait Ambulation/Gait assistance: Min guard Gait Distance (Feet): 12 Feet Assistive device: Rolling walker (2 wheels) Gait Pattern/deviations: Step-through pattern, Decreased stride length Gait velocity: decr     General Gait Details: min/guard for safety, pt is shaky at rest and with mobilty, 6' x 2 walking to 3 in 1 then to recliner, SaO2 90% on room air walking, distance limited by fatigue, 93% on room air at rest  Stairs            Wheelchair Mobility    Modified Rankin (Stroke Patients Only)       Balance Overall balance assessment: Needs assistance, History of Falls Sitting-balance support: Feet supported, No upper extremity supported Sitting balance-Leahy Scale: Good     Standing balance support: Bilateral upper extremity supported Standing balance-Leahy Scale: Poor Standing balance comment: relies on BUE support                             Pertinent Vitals/Pain Pain Assessment Pain Assessment: Faces Faces Pain Scale: Hurts little more Pain Location: abdomen Pain Descriptors / Indicators: Aching Pain  Intervention(s): Limited activity within patient's tolerance, Monitored during session, Repositioned    Home Living Family/patient expects to be discharged to:: Private residence Living Arrangements: Spouse/significant other Available Help at Discharge: Family Type of Home: Mobile home Home Access: Stairs to  enter Entrance Stairs-Rails: Can reach both Entrance Stairs-Number of Steps: Kim: One level Home Equipment: Conservation officer, nature (2 wheels);Cane - single point;Shower seat;Grab bars - toilet;Grab bars - tub/shower      Prior Function Prior Level of Function : Independent/Modified Independent             Mobility Comments: uses RW for mobility as needed ADLs Comments: independent ADLs, limited iADLs, does not drive     Hand Dominance   Dominant Hand: Right    Extremity/Trunk Assessment   Upper Extremity Assessment Upper Extremity Assessment: Defer to OT evaluation    Lower Extremity Assessment Lower Extremity Assessment: Generalized weakness;RLE deficits/detail;LLE deficits/detail RLE Deficits / Details: knee ext -4/5 RLE Sensation: WNL RLE Coordination: WNL LLE Deficits / Details: knee ext +4/5 LLE Sensation: WNL LLE Coordination: WNL    Cervical / Trunk Assessment Cervical / Trunk Assessment: Normal  Communication   Communication: No difficulties  Cognition Arousal/Alertness: Awake/alert Behavior During Therapy: WFL for tasks assessed/performed Overall Cognitive Status: No family/caregiver present to determine baseline cognitive functioning                                 General Comments: oriented to self, location, month/day/year; could not recall details of a recent fall        General Comments      Exercises     Assessment/Plan    PT Assessment Patient needs continued PT services  PT Problem List Decreased mobility;Decreased activity tolerance;Decreased balance       PT Treatment Interventions Gait training;Therapeutic exercise;Therapeutic activities;Functional mobility training    PT Goals (Current goals can be found in the Care Plan section)  Acute Rehab PT Goals Patient Stated Goal: to feel better PT Goal Formulation: With patient Time For Goal Achievement: 05/09/22 Potential to Achieve Goals: Good    Frequency Min  3X/week     Co-evaluation PT/OT/SLP Co-Evaluation/Treatment: Yes Reason for Co-Treatment: For patient/therapist safety;To address functional/ADL transfers PT goals addressed during session: Mobility/safety with mobility;Balance;Proper use of DME         AM-PAC PT "6 Clicks" Mobility  Outcome Measure Help needed turning from your back to your side while in a flat bed without using bedrails?: None Help needed moving from lying on your back to sitting on the side of a flat bed without using bedrails?: A Little Help needed moving to and from a bed to a chair (including a wheelchair)?: A Little Help needed standing up from a chair using your arms (e.g., wheelchair or bedside chair)?: A Little Help needed to walk in hospital room?: A Little Help needed climbing 3-5 steps with a railing? : A Little 6 Click Score: 19    End of Session Equipment Utilized During Treatment: Gait belt Activity Tolerance: Patient limited by fatigue Patient left: in chair;with chair alarm set;with call bell/phone within reach Nurse Communication: Mobility status PT Visit Diagnosis: Difficulty in walking, not elsewhere classified (R26.2);Unsteadiness on feet (R26.81)    Time: 8756-4332 PT Time Calculation (min) (ACUTE ONLY): 17 min   Charges:   PT Evaluation $PT Eval Moderate Complexity: 1 Mod         Welton Bord, Glade Stanford PT  04/25/2022  Acute Rehabilitation Services Pager [phone] Office [phone]

## 2022-04-26 DIAGNOSIS — K5900 Constipation, unspecified: Secondary | ICD-10-CM | POA: Diagnosis not present

## 2022-04-26 DIAGNOSIS — J479 Bronchiectasis, uncomplicated: Secondary | ICD-10-CM | POA: Diagnosis not present

## 2022-04-26 DIAGNOSIS — C7951 Secondary malignant neoplasm of bone: Secondary | ICD-10-CM

## 2022-04-26 DIAGNOSIS — J449 Chronic obstructive pulmonary disease, unspecified: Secondary | ICD-10-CM | POA: Diagnosis not present

## 2022-04-26 LAB — MAGNESIUM: Magnesium: 2.3 mg/dL (ref 1.7–2.4)

## 2022-04-26 MED ORDER — THIAMINE HCL 100 MG PO TABS
100.0000 mg | ORAL_TABLET | Freq: Every day | ORAL | Status: DC
  Administered 2022-04-26 – 2022-04-29 (×4): 100 mg via ORAL
  Filled 2022-04-26 (×4): qty 1

## 2022-04-26 MED ORDER — FOLIC ACID 1 MG PO TABS
1.0000 mg | ORAL_TABLET | Freq: Every day | ORAL | Status: DC
  Administered 2022-04-26 – 2022-04-29 (×4): 1 mg via ORAL
  Filled 2022-04-26 (×4): qty 1

## 2022-04-26 MED ORDER — THIAMINE HCL 100 MG/ML IJ SOLN: 100.0000 mg | Freq: Every day | INTRAMUSCULAR | Status: DC

## 2022-04-26 MED ORDER — LORAZEPAM 2 MG/ML IJ SOLN
1.0000 mg | INTRAMUSCULAR | Status: AC | PRN
  Administered 2022-04-26: 1 mg via INTRAVENOUS
  Filled 2022-04-26: qty 1

## 2022-04-26 MED ORDER — PEG 3350-KCL-NA BICARB-NACL 420 G PO SOLR
4000.0000 mL | Freq: Once | ORAL | Status: AC
  Administered 2022-04-26: 4000 mL via ORAL

## 2022-04-26 MED ORDER — LORAZEPAM 1 MG PO TABS
1.0000 mg | ORAL_TABLET | ORAL | Status: AC | PRN
  Administered 2022-04-27: 1 mg via ORAL
  Filled 2022-04-26: qty 1

## 2022-04-26 MED ORDER — SODIUM CHLORIDE 0.9 % IV SOLN: INTRAVENOUS | Status: DC

## 2022-04-26 MED ORDER — ADULT MULTIVITAMIN W/MINERALS CH
1.0000 | ORAL_TABLET | Freq: Every day | ORAL | Status: DC
  Administered 2022-04-26 – 2022-04-29 (×4): 1 via ORAL
  Filled 2022-04-26 (×4): qty 1

## 2022-04-26 MED ORDER — SODIUM CHLORIDE 0.9 % IV SOLN: Freq: Once | INTRAVENOUS | Status: DC

## 2022-04-26 NOTE — Progress Notes (Signed)
[facility] PROGRESS NOTE   Catherine Kelley PFX:[id number] DOB: Jan 31, 1951 DOA: 04/23/2022  PCP: Maximiano Coss, NP  Brief History/Interval Summary: 71 y.o. female with past medical history of essential hypertension, bronchiectasis, bipolar disorder, chronic back pain who has had multiple visits to the emergency department and her PCP for constipation over the last few weeks.  She presented again with similar issues.  Also reported a weight loss of 10 pounds.  CT scan revealed progressive sclerotic lesions in her vertebrae.  She was hospitalized for further management.  Consultants: Gastroenterology.  Pulmonology.  Procedures: None yet    Subjective/Interval History: Patient is hard of hearing.  She was experiencing a lot of rectal pain yesterday afternoon after she underwent disimpaction.  She had to be given morphine followed by Dilaudid.  Feels much better this morning.  Denies any abdominal pain nausea or vomiting.  No shortness of breath.  Back pain is stable.   Assessment/Plan:  Severe constipation Disimpaction, laxatives stool softeners ordered.  TSH noted to be mildly elevated at 6.18.  Free T4 is normal.  Recommend rechecking thyroid function test in a few weeks. Gastroenterology was consulted due to relatively sudden onset of constipation.  Looks like plan is for colonoscopy after bowel prep.  Underwent manual disimpaction yesterday.  To undergo more bowel prep today with plans for colonoscopy tomorrow. She is noted to be on Senokot on a scheduled basis.  She is on Dulcolax suppository as needed.  She received half a container of MiraLAX on 5/25.    Vertebral lesions, sclerotic Multiple lesions noted in the thoracic and lumbar vertebrae.  These are sclerotic as per CT report.  Noted previously in 2021 as well but they appear to have progressed.  It does not appear the patient has had any work-up for these.  She tells me that she is up-to-date on her mammograms.  Her last  mammogram in our system was in 2021 which was unremarkable.  She mentions that she is due for another mammogram this year. Her calcium level noted to be normal along with normal albumin. CT scan of the chest does raise concern for lung masses.  See below. Bone scan also revealed sclerotic lesions from T11-L3 raising concern for osseous metastatic process. She will need further work-up for these but can be pursued in the outpatient setting once her acute GI issues have been dealt with.   History of bronchiectasis/COPD/abnormal CT chest It appears that she was previously followed by Dr. Chase Caller.  Has not seen them in several years.   CT of the chest reveals significant bronchiectasis with concern for lung masses. Seen by pulmonology.  They have prescribed nebulizer treatments.  They recommend outpatient evaluation of the masses in the next week or so.  She will also need a PET scan.  They are working on getting her an appointment to see Dr. Chase Caller.   Has had a few COPD exacerbations in the past few months.  Has been on prednisone a few times.  Continues to smoke cigarettes. Respiratory status is stable.  Tobacco abuse Counseled.  Nicotine patch  Alcohol abuse Drinks 3 times a week.  Noted to be somewhat tremulous this morning.  Will initiate CIWA protocol.  Thiamine multivitamins folic acid.  Hyponatremia Probably due to hypovolemia with some component of SIADH considering her significant bronchiectases.  Urine osmolality was noted to be elevated.  She was given IV fluids with improvement in sodium to 133.  Recheck labs tomorrow.  Essential hypertension Continue amlodipine.  Bipolar disorder Followed by psychiatry.  Noted to be on lithium, nortriptyline and venlafaxine.  Lithium level was 1.2 on 5/25.  Noted to be somewhat tremulous this morning.  Could be early onset alcohol withdrawal.  Could also be due to lithium.  Level that was checked was upper limits of normal.  We will recheck  it again tomorrow.  Urinary retention Likely due to constipation and immobility.  She has allergy to sulfa drugs with cross-reactivity.  We will hold off on prescribing Flomax.   Foley catheter was considered.  However patient has been able to void on her own and has not required any further in and out catheterization.  Continue to monitor.    DVT Prophylaxis: Lovenox Code Status: Full code Family Communication: Attempted to reach both husband and daughter have not been successful despite multiple attempts.  We will try again today.  Patient prefers that I reach out to the husband rather than her daughter. Disposition Plan: Hopefully return home once GI work-up is completed and once her constipation has resolved.  Status is: Inpatient Remains inpatient appropriate because: Needs GI work-up for sudden onset constipation      Medications: Scheduled:  amLODipine  2.5 mg Oral Daily   arformoterol  15 mcg Nebulization BID   enoxaparin (LOVENOX) injection  40 mg Subcutaneous Daily   feeding supplement  1 Container Oral TID BM   fluticasone  2 spray Each Nare Daily   folic acid  1 mg Oral Daily   lidocaine  1 patch Transdermal Q24H   lithium carbonate  300 mg Oral QPM   lithium carbonate  600 mg Oral Daily   loratadine  10 mg Oral Daily   multivitamin with minerals  1 tablet Oral Daily   nicotine  14 mg Transdermal Daily   nortriptyline  50 mg Oral QHS   revefenacin  175 mcg Nebulization Daily   senna  2 tablet Oral BID   thiamine  100 mg Oral Daily   Or   thiamine  100 mg Intravenous Daily   venlafaxine XR  75 mg Oral Q breakfast   Continuous:  sodium chloride 50 mL/hr at 04/25/22 1808   HBZ:JIRCVELFYBOFB **OR** acetaminophen, albuterol, bisacodyl, hydrocortisone, HYDROmorphone (DILAUDID) injection, ketorolac, LORazepam **OR** LORazepam, ondansetron **OR** ondansetron (ZOFRAN) IV  Antibiotics: Anti-infectives (From admission, onward)    Start     Dose/Rate Route Frequency  Ordered Stop   04/24/22 0000  amoxicillin-clavulanate (AUGMENTIN) 875-125 MG tablet  Status:  Discontinued        1 tablet Oral Every 12 hours 04/24/22 0020 04/24/22        Objective:  Vital Signs  Vitals:   04/25/22 1924 04/25/22 2024 04/26/22 0406 04/26/22 0811  BP: 132/77  117/63   Pulse: 88  76   Resp: 12  19   Temp: (!) 97.5 F (36.4 C)  98.6 F (37 C)   TempSrc:      SpO2: (!) 89% 92% 91% 94%  Weight:      Height:        Intake/Output Summary (Last 24 hours) at 04/26/2022 0836 Last data filed at 04/26/2022 0400 Gross per 24 hour  Intake 1842.22 ml  Output 1101 ml  Net 741.22 ml    Filed Weights   04/23/22 1511  Weight: 45.8 kg   General appearance: Awake alert.  In no distress.  Mildly tremulous. Resp: Clear to auscultation bilaterally.  Normal effort Cardio: S1-S2 is normal regular.  No S3-S4.  No rubs murmurs or  bruit GI: Abdomen is soft.  Nontender nondistended.  Bowel sounds are present normal.  No masses organomegaly Extremities: No edema.  Able to lift both legs off the bed Neurologic:  No focal neurological deficits.    Lab Results:  Data Reviewed: I have personally reviewed following labs and reports of the imaging studies  CBC: Recent Labs  Lab 04/23/22 1846 04/25/22 0621  WBC 10.7* 5.9  NEUTROABS 9.0*  --   HGB 14.0 13.3  HCT 44.5 41.5  MCV 94.1 96.3  PLT 187 141*     Basic Metabolic Panel: Recent Labs  Lab 04/23/22 1846 04/25/22 0621  NA 127* 133*  K 4.6 4.3  CL 95* 106  CO2 24 19*  GLUCOSE 86 81  BUN 9 7*  CREATININE 0.57 0.49  CALCIUM 10.1 9.3     GFR: Estimated Creatinine Clearance: 46.6 mL/min (by C-G formula based on SCr of 0.49 mg/dL).  Liver Function Tests: Recent Labs  Lab 04/23/22 1846 04/25/22 0621  AST 28 22  ALT 22 20  ALKPHOS 209* 173*  BILITOT 0.6 0.8  PROT 7.6 6.5  ALBUMIN 3.7 3.1*     Recent Labs  Lab 04/23/22 1846  LIPASE 29     Coagulation Profile: Recent Labs  Lab 04/25/22 0621   INR 1.1    Thyroid Function Tests: Recent Labs    04/24/22 1020 04/25/22 0621  TSH 6.181*  --   FREET4  --  1.12      Radiology Studies: CT CHEST W CONTRAST  Result Date: 04/24/2022 CLINICAL DATA:  Chronic dyspnea, unclear etiology, sclerotic lower thoracic and lumbar spine lesions, primary malignancy search EXAM: CT CHEST WITH CONTRAST TECHNIQUE: Multidetector CT imaging of the chest was performed during intravenous contrast administration. RADIATION DOSE REDUCTION: This exam was performed according to the departmental dose-optimization program which includes automated exposure control, adjustment of the mA and/or kV according to patient size and/or use of iterative reconstruction technique. CONTRAST:  36mL OMNIPAQUE IOHEXOL 300 MG/ML  SOLN COMPARISON:  CT abdomen pelvis, 04/23/2022, CT chest, 11/08/2020 FINDINGS: Cardiovascular: Aortic atherosclerosis. Normal heart size. Coronary artery calcifications. No pericardial effusion. Mediastinum/Nodes: Interval enlargement of mediastinal and bilateral hilar lymph nodes, largest pretracheal nodes measuring 1.8 x 1.3 cm, previously 1.1 x 0.9 cm (series 2, image 58). Thyroid gland, trachea, and esophagus demonstrate no significant findings. Lungs/Pleura: Severe centrilobular and paraseptal emphysema. Severe, varicoid and cystic bronchiectasis in the right greater than left bilateral lung bases (series 5, image 107). Slightly worsened associated nodular airspace disease, particularly in the left lung base (series 6, image 112). Significant interval enlargement of a subpleural mass of the posterior right upper lobe, measuring 3.3 x 2.2 cm, previously 1.3 x 1.1 cm (series 6, image 68). There is additional, new masslike consolidation laterally, measuring 4.5 x 1.9 cm (series 5, image 62). Multiple additional foci of less solid-appearing subpleural ground-glass and consolidative airspace disease within the medial right upper lobe (series 6, image 64) and  subpleural left upper lobe (series 5, image 49). No pleural effusion or pneumothorax. Upper Abdomen: No acute abnormality. Musculoskeletal: No chest wall abnormality. Densely sclerotic osseous lesions of the T11 and lower included vertebral bodies, better included by prior CT of the abdomen and pelvis. IMPRESSION: 1. Significant interval enlargement of a subpleural masslike consolidation of the posterior right upper lobe in comparison to prior CT of the chest dated 11/08/2020, highly suspicious for primary lung malignancy. 2. There is additional, new masslike consolidation laterally in the right upper lobe, as well  as multiple additional foci of less solid-appearing subpleural ground-glass and consolidative airspace disease within the medial right upper lobe and subpleural left upper lobe. Although multifocal or metastatic malignancy is a general differential consideration, appearance of these additional lesions is more suggestive of airspace disease and chronic sequelae. PET-CT may be helpful for metabolic characterization of all of the above lesions. 3. Interval enlargement of mediastinal and bilateral hilar lymph nodes, possibly reactive although suspicious for nodal metastatic disease. As above, PET-CT may be helpful for metabolic characterization. 4. Severe, varicoid and cystic bronchiectasis in the right greater than left bilateral lung bases, as seen by prior examination. Slightly worsened associated nodular airspace disease, particularly in the left lung base. Findings are consistent with chronic sequelae of aspiration, likely ongoing. 5. Severe emphysema. 6. Coronary artery disease. 7. Densely sclerotic osseous lesions of the T11 and lower included vertebral bodies, better included by prior CT of the abdomen and pelvis. These remain highly suspicious for osseous metastases, however general differential considerations would include chronic osteomyelitis given the apparently isolated, focal distribution as  well as no previous history of treatment to induce post treatment sclerosis. No additional osseous lesions identified in the chest above the T11 vertebral body. Aortic Atherosclerosis (ICD10-I70.0) and Emphysema (ICD10-J43.9). Electronically Signed   By: Delanna Ahmadi M.D.   On: 04/24/2022 13:36   NM Bone Scan Whole Body  Result Date: 04/24/2022 CLINICAL DATA:  Abnormal CT scan, lower back pain, weight loss, elevated alkaline phosphatase, question osseous metastatic disease EXAM: NUCLEAR MEDICINE WHOLE BODY BONE SCAN TECHNIQUE: Whole body anterior and posterior images were obtained approximately 3 hours after intravenous injection of radiopharmaceutical. RADIOPHARMACEUTICALS:  22 mCi Technetium-25m MDP IV COMPARISON:  CT chest 04/24/2022, CT abdomen and pelvis 04/23/2022 FINDINGS: Abnormal increased tracer uptake identified within thoracolumbar spine at approximately T11, T12, L1, and L2 highly suspicious for metastatic disease. These areas correspond to foci of dense sclerosis on CT. Additional small focus of increased tracer uptake within LEFT L3. No additional worrisome sites of tracer accumulation. Distended urinary bladder with retained tracer,. Remaining soft tissue distribution of tracer unremarkable. IMPRESSION: Abnormal increased uptake within thoracolumbar spine at T11-L3 corresponding to sclerotic foci on recent CT most consistent with osseous metastatic disease. Distended urinary bladder, patient unable to empty. Electronically Signed   By: Lavonia Dana M.D.   On: 04/24/2022 14:48       LOS: 1 day   [facility] Pager on [email or web address]  04/26/2022, 8:36 AM

## 2022-04-26 NOTE — Progress Notes (Signed)
Subjective: No new complaints.  Objective: Vital signs in last 24 hours: Temp:  [97.5 F (36.4 C)-98.6 F (37 C)] 98.6 F (37 C) (05/27 0406) Pulse Rate:  [76-98] 76 (05/27 0406) Resp:  [12-20] 19 (05/27 0406) BP: (103-136)/(63-77) 117/63 (05/27 0406) SpO2:  [89 %-96 %] 94 % (05/27 0811)    Intake/Output from previous day: 05/26 0701 - 05/27 0700 In: 1922.2 [P.O.:1380; I.V.:542.2] Out: 1301 [Urine:1300; Stool:1] Intake/Output this shift: Total I/O In: -  Out: 400 [Urine:400]  General appearance: alert and no distress GI: soft, but appears distended  Lab Results: Recent Labs    04/23/22 1846 04/25/22 0621  WBC 10.7* 5.9  HGB 14.0 13.3  HCT 44.5 41.5  PLT 187 141*   BMET Recent Labs    04/23/22 1846 04/25/22 0621  NA 127* 133*  K 4.6 4.3  CL 95* 106  CO2 24 19*  GLUCOSE 86 81  BUN 9 7*  CREATININE 0.57 0.49  CALCIUM 10.1 9.3   LFT Recent Labs    04/25/22 0621  PROT 6.5  ALBUMIN 3.1*  AST 22  ALT 20  ALKPHOS 173*  BILITOT 0.8   PT/INR Recent Labs    04/25/22 0621  LABPROT 13.8  INR 1.1   Hepatitis Panel No results for input(s): HEPBSAG, HCVAB, HEPAIGM, HEPBIGM in the last 72 hours. C-Diff No results for input(s): CDIFFTOX in the last 72 hours. Fecal Lactopherrin No results for input(s): FECLLACTOFRN in the last 72 hours.  Studies/Results: CT CHEST W CONTRAST  Result Date: 04/24/2022 CLINICAL DATA:  Chronic dyspnea, unclear etiology, sclerotic lower thoracic and lumbar spine lesions, primary malignancy search EXAM: CT CHEST WITH CONTRAST TECHNIQUE: Multidetector CT imaging of the chest was performed during intravenous contrast administration. RADIATION DOSE REDUCTION: This exam was performed according to the departmental dose-optimization program which includes automated exposure control, adjustment of the mA and/or kV according to patient size and/or use of iterative reconstruction technique. CONTRAST:  89mL OMNIPAQUE IOHEXOL 300 MG/ML  SOLN  COMPARISON:  CT abdomen pelvis, 04/23/2022, CT chest, 11/08/2020 FINDINGS: Cardiovascular: Aortic atherosclerosis. Normal heart size. Coronary artery calcifications. No pericardial effusion. Mediastinum/Nodes: Interval enlargement of mediastinal and bilateral hilar lymph nodes, largest pretracheal nodes measuring 1.8 x 1.3 cm, previously 1.1 x 0.9 cm (series 2, image 58). Thyroid gland, trachea, and esophagus demonstrate no significant findings. Lungs/Pleura: Severe centrilobular and paraseptal emphysema. Severe, varicoid and cystic bronchiectasis in the right greater than left bilateral lung bases (series 5, image 107). Slightly worsened associated nodular airspace disease, particularly in the left lung base (series 6, image 112). Significant interval enlargement of a subpleural mass of the posterior right upper lobe, measuring 3.3 x 2.2 cm, previously 1.3 x 1.1 cm (series 6, image 68). There is additional, new masslike consolidation laterally, measuring 4.5 x 1.9 cm (series 5, image 62). Multiple additional foci of less solid-appearing subpleural ground-glass and consolidative airspace disease within the medial right upper lobe (series 6, image 64) and subpleural left upper lobe (series 5, image 49). No pleural effusion or pneumothorax. Upper Abdomen: No acute abnormality. Musculoskeletal: No chest wall abnormality. Densely sclerotic osseous lesions of the T11 and lower included vertebral bodies, better included by prior CT of the abdomen and pelvis. IMPRESSION: 1. Significant interval enlargement of a subpleural masslike consolidation of the posterior right upper lobe in comparison to prior CT of the chest dated 11/08/2020, highly suspicious for primary lung malignancy. 2. There is additional, new masslike consolidation laterally in the right upper lobe, as well as multiple  additional foci of less solid-appearing subpleural ground-glass and consolidative airspace disease within the medial right upper lobe and  subpleural left upper lobe. Although multifocal or metastatic malignancy is a general differential consideration, appearance of these additional lesions is more suggestive of airspace disease and chronic sequelae. PET-CT may be helpful for metabolic characterization of all of the above lesions. 3. Interval enlargement of mediastinal and bilateral hilar lymph nodes, possibly reactive although suspicious for nodal metastatic disease. As above, PET-CT may be helpful for metabolic characterization. 4. Severe, varicoid and cystic bronchiectasis in the right greater than left bilateral lung bases, as seen by prior examination. Slightly worsened associated nodular airspace disease, particularly in the left lung base. Findings are consistent with chronic sequelae of aspiration, likely ongoing. 5. Severe emphysema. 6. Coronary artery disease. 7. Densely sclerotic osseous lesions of the T11 and lower included vertebral bodies, better included by prior CT of the abdomen and pelvis. These remain highly suspicious for osseous metastases, however general differential considerations would include chronic osteomyelitis given the apparently isolated, focal distribution as well as no previous history of treatment to induce post treatment sclerosis. No additional osseous lesions identified in the chest above the T11 vertebral body. Aortic Atherosclerosis (ICD10-I70.0) and Emphysema (ICD10-J43.9). Electronically Signed   By: Delanna Ahmadi M.D.   On: 04/24/2022 13:36   NM Bone Scan Whole Body  Result Date: 04/24/2022 CLINICAL DATA:  Abnormal CT scan, lower back pain, weight loss, elevated alkaline phosphatase, question osseous metastatic disease EXAM: NUCLEAR MEDICINE WHOLE BODY BONE SCAN TECHNIQUE: Whole body anterior and posterior images were obtained approximately 3 hours after intravenous injection of radiopharmaceutical. RADIOPHARMACEUTICALS:  22 mCi Technetium-29m MDP IV COMPARISON:  CT chest 04/24/2022, CT abdomen and pelvis  04/23/2022 FINDINGS: Abnormal increased tracer uptake identified within thoracolumbar spine at approximately T11, T12, L1, and L2 highly suspicious for metastatic disease. These areas correspond to foci of dense sclerosis on CT. Additional small focus of increased tracer uptake within LEFT L3. No additional worrisome sites of tracer accumulation. Distended urinary bladder with retained tracer,. Remaining soft tissue distribution of tracer unremarkable. IMPRESSION: Abnormal increased uptake within thoracolumbar spine at T11-L3 corresponding to sclerotic foci on recent CT most consistent with osseous metastatic disease. Distended urinary bladder, patient unable to empty. Electronically Signed   By: Lavonia Dana M.D.   On: 04/24/2022 14:48    Medications: Scheduled:  amLODipine  2.5 mg Oral Daily   arformoterol  15 mcg Nebulization BID   enoxaparin (LOVENOX) injection  40 mg Subcutaneous Daily   feeding supplement  1 Container Oral TID BM   fluticasone  2 spray Each Nare Daily   folic acid  1 mg Oral Daily   lidocaine  1 patch Transdermal Q24H   lithium carbonate  300 mg Oral QPM   lithium carbonate  600 mg Oral Daily   loratadine  10 mg Oral Daily   multivitamin with minerals  1 tablet Oral Daily   nicotine  14 mg Transdermal Daily   nortriptyline  50 mg Oral QHS   revefenacin  175 mcg Nebulization Daily   senna  2 tablet Oral BID   thiamine  100 mg Oral Daily   Or   thiamine  100 mg Intravenous Daily   venlafaxine XR  75 mg Oral Q breakfast   Continuous:  sodium chloride 50 mL/hr at 04/25/22 1808    Assessment/Plan: 1) Stercoral proctitis. 2) Constipation.   She was not able to provide a clear report with regards to her constipation  and the laxative treatment.  It seems as if she did have some diarrhea.  A colonoscopy will be attempted tomorrow.  Plan: 1) Colonoscopy tomorrow.  LOS: 1 day   Catherine Kelley D 04/26/2022, 9:32 AM

## 2022-04-26 NOTE — Progress Notes (Signed)
Pt states she is unable to drink anymore of her bowel prep. She state she feels like she is "going to throw up", and that she is tired and hurting. Pt is encouraged to continue through the night if possible for colonoscopy to be completed. Pt asked, "Are they gonna be mad at me?" Pt given support that no one will be mad, but a colonoscopy may not be able to be done.

## 2022-04-26 NOTE — Progress Notes (Signed)
Physical Therapy Treatment Patient Details Name: Catherine Kelley MRN: [id number] DOB: August 25, 1951 Today's Date: 04/26/2022   History of Present Illness 71 y.o. female with past medical history of essential hypertension, bronchiectasis, bipolar disorder, chronic back pain who has had multiple visits to the emergency department and her PCP for constipation over the last few weeks.  She presented again with similar issues.  Also reported a weight loss of 10 pounds.  CT scan revealed progressive sclerotic lesions in her vertebrae.    PT Comments    Pt tolerated increased ambulation distance of 70' today, no loss of balance with RW, no dyspnea with activity. Pt reports she has a walker at home. HHPT recommended.    Recommendations for follow up therapy are one component of a multi-disciplinary discharge planning process, led by the attending physician.  Recommendations may be updated based on patient status, additional functional criteria and insurance authorization.  Follow Up Recommendations  Home health PT     Assistance Recommended at Discharge Set up Supervision/Assistance  Patient can return home with the following A little help with walking and/or transfers;A little help with bathing/dressing/bathroom;Assistance with cooking/housework;Direct supervision/assist for medications management;Assist for transportation;Help with stairs or ramp for entrance   Equipment Recommendations  None recommended by PT    Recommendations for Other Services       Precautions / Restrictions Precautions Precautions: Fall Precaution Comments: pt reports h/o 1 fall in past 6 months but is unable to recall details Restrictions Weight Bearing Restrictions: No     Mobility  Bed Mobility Overal bed mobility: Modified Independent             General bed mobility comments: used bedrail    Transfers Overall transfer level: Needs assistance Equipment used: Rolling walker (2 wheels) Transfers: Sit  to/from Stand Sit to Stand: Min guard           General transfer comment: verbal and manual cues for hand placement    Ambulation/Gait Ambulation/Gait assistance: Min guard Gait Distance (Feet): 70 Feet Assistive device: Rolling walker (2 wheels) Gait Pattern/deviations: Step-through pattern, Decreased stride length, Narrow base of support Gait velocity: decr     General Gait Details: VCs for positioning in RW, no loss of balance, no dyspnea   Stairs             Wheelchair Mobility    Modified Rankin (Stroke Patients Only)       Balance Overall balance assessment: Needs assistance, History of Falls Sitting-balance support: Feet supported, No upper extremity supported Sitting balance-Leahy Scale: Good     Standing balance support: Bilateral upper extremity supported, During functional activity Standing balance-Leahy Scale: Poor Standing balance comment: relies on BUE support                            Cognition Arousal/Alertness: Awake/alert Behavior During Therapy: WFL for tasks assessed/performed Overall Cognitive Status: No family/caregiver present to determine baseline cognitive functioning                                 General Comments: oriented to self, location, month/day/year; could not recall details of a recent fall        Exercises      General Comments        Pertinent Vitals/Pain Pain Assessment Pain Assessment: No/denies pain    Home Living  Prior Function            PT Goals (current goals can now be found in the care plan section) Acute Rehab PT Goals Patient Stated Goal: to feel better PT Goal Formulation: With patient Time For Goal Achievement: 05/09/22 Potential to Achieve Goals: Good Progress towards PT goals: Progressing toward goals    Frequency    Min 3X/week      PT Plan Current plan remains appropriate    Co-evaluation               AM-PAC PT "6 Clicks" Mobility   Outcome Measure  Help needed turning from your back to your side while in a flat bed without using bedrails?: None Help needed moving from lying on your back to sitting on the side of a flat bed without using bedrails?: A Little Help needed moving to and from a bed to a chair (including a wheelchair)?: A Little Help needed standing up from a chair using your arms (e.g., wheelchair or bedside chair)?: A Little Help needed to walk in hospital room?: A Little Help needed climbing 3-5 steps with a railing? : A Little 6 Click Score: 19    End of Session Equipment Utilized During Treatment: Gait belt Activity Tolerance: Patient limited by fatigue Patient left: in chair;with chair alarm set;with call bell/phone within reach;with family/visitor present Nurse Communication: Mobility status PT Visit Diagnosis: Difficulty in walking, not elsewhere classified (R26.2);Unsteadiness on feet (R26.81)     Time: 0768-0881 PT Time Calculation (min) (ACUTE ONLY): 16 min  Charges:  $Gait Training: 8-22 mins                     Blondell Reveal Kistler PT 04/26/2022  Acute Rehabilitation Services Pager [phone] Office [phone]

## 2022-04-27 ENCOUNTER — Inpatient Hospital Stay (HOSPITAL_COMMUNITY): Payer: Medicare PPO

## 2022-04-27 DIAGNOSIS — D696 Thrombocytopenia, unspecified: Secondary | ICD-10-CM

## 2022-04-27 DIAGNOSIS — C7951 Secondary malignant neoplasm of bone: Secondary | ICD-10-CM | POA: Diagnosis not present

## 2022-04-27 DIAGNOSIS — K5900 Constipation, unspecified: Secondary | ICD-10-CM | POA: Diagnosis not present

## 2022-04-27 DIAGNOSIS — J449 Chronic obstructive pulmonary disease, unspecified: Secondary | ICD-10-CM | POA: Diagnosis not present

## 2022-04-27 DIAGNOSIS — R051 Acute cough: Secondary | ICD-10-CM | POA: Diagnosis not present

## 2022-04-27 LAB — CBC
HCT: 38.2 % (ref 36.0–46.0)
Hemoglobin: 12.1 g/dL (ref 12.0–15.0)
MCH: 30.9 pg (ref 26.0–34.0)
MCHC: 31.7 g/dL (ref 30.0–36.0)
MCV: 97.4 fL (ref 80.0–100.0)
Platelets: 97 10*3/uL — ABNORMAL LOW (ref 150–400)
RBC: 3.92 MIL/uL (ref 3.87–5.11)
RDW: 14 % (ref 11.5–15.5)
WBC: 4.5 10*3/uL (ref 4.0–10.5)
nRBC: 0 % (ref 0.0–0.2)

## 2022-04-27 LAB — BASIC METABOLIC PANEL
Anion gap: 4 — ABNORMAL LOW (ref 5–15)
BUN: 6 mg/dL — ABNORMAL LOW (ref 8–23)
CO2: 23 mmol/L (ref 22–32)
Calcium: 8.6 mg/dL — ABNORMAL LOW (ref 8.9–10.3)
Chloride: 104 mmol/L (ref 98–111)
Creatinine, Ser: 0.43 mg/dL — ABNORMAL LOW (ref 0.44–1.00)
GFR, Estimated: >60 mL/min (ref >60)
Glucose, Bld: 66 mg/dL — ABNORMAL LOW (ref 70–99)
Potassium: 4.3 mmol/L (ref 3.5–5.1)
Sodium: 131 mmol/L — ABNORMAL LOW (ref 135–145)

## 2022-04-27 LAB — MAGNESIUM: Magnesium: 2.3 mg/dL (ref 1.7–2.4)

## 2022-04-27 LAB — LITHIUM LEVEL: Lithium Lvl: 1.14 mmol/L (ref 0.60–1.20)

## 2022-04-27 LAB — GLUCOSE, CAPILLARY
Glucose-Capillary: 92 mg/dL (ref 70–99)
Glucose-Capillary: 96 mg/dL (ref 70–99)

## 2022-04-27 MED ORDER — HALOPERIDOL LACTATE 5 MG/ML IJ SOLN
1.0000 mg | Freq: Once | INTRAMUSCULAR | Status: AC
  Administered 2022-04-27: 1 mg via INTRAVENOUS
  Filled 2022-04-27: qty 1

## 2022-04-27 MED ORDER — AMOXICILLIN-POT CLAVULANATE 875-125 MG PO TABS
1.0000 | ORAL_TABLET | Freq: Two times a day (BID) | ORAL | Status: DC
  Administered 2022-04-27 – 2022-04-29 (×5): 1 via ORAL
  Filled 2022-04-27 (×5): qty 1

## 2022-04-27 MED ORDER — DEXTROSE 50 % IV SOLN
25.0000 mL | Freq: Once | INTRAVENOUS | Status: AC
  Administered 2022-04-27: 25 mL via INTRAVENOUS
  Filled 2022-04-27: qty 50

## 2022-04-27 MED ORDER — SODIUM CHLORIDE 0.9 % IV SOLN
250.0000 mL | INTRAVENOUS | Status: DC | PRN
Start: 2022-04-27 — End: 2022-04-29

## 2022-04-27 MED ORDER — PEG 3350-KCL-NA BICARB-NACL 420 G PO SOLR
4000.0000 mL | Freq: Once | ORAL | Status: AC
  Administered 2022-04-27: 4000 mL via ORAL

## 2022-04-27 MED ORDER — SODIUM CHLORIDE 0.9% FLUSH
3.0000 mL | Freq: Two times a day (BID) | INTRAVENOUS | Status: DC
  Administered 2022-04-27 (×2): 3 mL via INTRAVENOUS

## 2022-04-27 MED ORDER — DEXTROSE-NACL 5-0.45 % IV SOLN
INTRAVENOUS | Status: DC
Start: 2022-04-27 — End: 2022-04-29

## 2022-04-27 MED ORDER — LITHIUM CARBONATE ER 300 MG PO TBCR
600.0000 mg | EXTENDED_RELEASE_TABLET | Freq: Every day | ORAL | Status: DC
  Administered 2022-04-27 – 2022-04-29 (×3): 600 mg via ORAL
  Filled 2022-04-27 (×3): qty 2

## 2022-04-27 MED ORDER — SODIUM CHLORIDE 0.9% FLUSH: 3.0000 mL | INTRAVENOUS | Status: DC | PRN

## 2022-04-27 NOTE — Progress Notes (Signed)
[facility] PROGRESS NOTE   Catherine Kelley XIP:[id number] DOB: [birth year] DOA: 04/23/2022  PCP: Maximiano Coss, NP  Brief History/Interval Summary: 71 y.o. female with past medical history of essential hypertension, bronchiectasis, bipolar disorder, chronic back pain who has had multiple visits to the emergency department and her PCP for constipation over the last few weeks.  She presented again with similar issues.  Also reported a weight loss of 10 pounds.  CT scan revealed progressive sclerotic lesions in her vertebrae.  She was hospitalized for further management.  Consultants: Gastroenterology.  Pulmonology.  Procedures: None yet    Subjective/Interval History: Patient is hard of hearing.  Mentions that she tried to take her prep but could not complete it.  Still having brown-colored stool.  She did have multiple bowel movements overnight.  They are liquidy.  Looks like GI has postpone the colonoscopy.  Patient also noted to be on oxygen.  She does mention a cough.  Does not appear to be tachypneic at this time.     Assessment/Plan:  Severe constipation Etiology is unclear.  Patient was ordered laxative and stool softeners.  She had to be disimpacted.  TSH noted to be mildly elevated at 6.18.  Free T4 is normal.  Recommend rechecking thyroid function test in a few weeks.  Unlikely that thyroid dysfunction is the reason for her constipation. Gastroenterology was consulted due to relatively sudden onset of constipation.  Plan is for colonoscopy.  However patient poorly prepped this morning's of colonoscopy may have to be postponed.   Continue bowel regimen.  She is noted to be on scheduled Senokot.  Received bowel prep for colonoscopy yesterday.    Cough/hypoxia Noted to be on 2 L of oxygen by nasal cannula.  Saturations were 89% overnight.  She does have a cough.  Few crackles on exam.  We will proceed with portable chest x-ray.  Thrombocytopenia Drop in platelet counts  noted.  More than 50% drop.  She is noted to be on Lovenox.  This will be stopped.  HIT panel ordered including antibodies.  SCDs for now.  Hypoglycemia Likely due to n.p.o. status.  We will give half an ampule of D50.  Change IV fluids to D5.  Vertebral lesions, sclerotic Multiple lesions noted in the thoracic and lumbar vertebrae.  These are sclerotic as per CT report.  Noted previously in 2021 as well but they appear to have progressed.  It does not appear the patient has had any work-up for these.  She tells me that she is up-to-date on her mammograms.  Her last mammogram in our system was in 2021 which was unremarkable.  She mentions that she is due for another mammogram this year. Her calcium level noted to be normal along with normal albumin. CT scan of the chest does raise concern for lung masses.  See below. Bone scan also revealed sclerotic lesions from T11-L3 raising concern for osseous metastatic process. She will need further work-up for these but can be pursued in the outpatient setting once her acute GI issues have been dealt with.   History of bronchiectasis/COPD/abnormal CT chest It appears that she was previously followed by Dr. Chase Caller.  Has not seen them in several years.   CT of the chest reveals significant bronchiectasis with concern for lung masses. Seen by pulmonology.  They have prescribed nebulizer treatments.  They recommend outpatient evaluation of the masses in the next week or so.  She will also need a PET scan.  They are  working on getting her an appointment to see Dr. Chase Caller.   Has had a few COPD exacerbations in the past few months.  Has been on prednisone a few times.  Continues to smoke cigarettes. See above regarding cough and hypoxia  Tobacco abuse Counseled.  Nicotine patch  Alcohol abuse Drinks 3 times a week.  Started on CIWA protocol.  Not as tremulous today.  Continue thiamine multivitamins folic acid.    Hyponatremia Probably due to  hypovolemia with some component of SIADH considering her significant bronchiectases.  Urine osmolality was noted to be elevated.  Sodium level fluctuating but stable for the most part.  Essential hypertension Continue amlodipine.   Bipolar disorder Followed by psychiatry.  Noted to be on lithium, nortriptyline and venlafaxine.  Lithium level was 1.2 on 5/25.   Noted to be tremulous yesterday.  Lithium level was rechecked and was noted to be 1.14.  Continue current dose.    Urinary retention Likely due to constipation and immobility.  She has allergy to sulfa drugs with cross-reactivity.  We will hold off on prescribing Flomax.   Foley catheter was considered.  However patient has been able to void on her own and has not required any further in and out catheterization.  Continue to monitor.    DVT Prophylaxis: Lovenox Code Status: Full code Family Communication: Finally was able to speak to her husband yesterday.  He was updated.  Patient prefers that we reach out to the husband rather than her daughter. Disposition Plan: Hopefully return home once GI work-up is completed and once her constipation has resolved.  Status is: Inpatient Remains inpatient appropriate because: Needs GI work-up for sudden onset constipation      Medications: Scheduled:  amLODipine  2.5 mg Oral Daily   arformoterol  15 mcg Nebulization BID   dextrose  25 mL Intravenous Once   feeding supplement  1 Container Oral TID BM   fluticasone  2 spray Each Nare Daily   folic acid  1 mg Oral Daily   lidocaine  1 patch Transdermal Q24H   lithium carbonate  300 mg Oral QPM   lithium carbonate  600 mg Oral Daily   loratadine  10 mg Oral Daily   multivitamin with minerals  1 tablet Oral Daily   nicotine  14 mg Transdermal Daily   nortriptyline  50 mg Oral QHS   revefenacin  175 mcg Nebulization Daily   senna  2 tablet Oral BID   sodium chloride flush  3 mL Intravenous Q12H   thiamine  100 mg Oral Daily   Or    thiamine  100 mg Intravenous Daily   venlafaxine XR  75 mg Oral Q breakfast   Continuous:  sodium chloride     sodium chloride     sodium chloride     dextrose 5 % and 0.45% NaCl     LOV:FIEPPI chloride, acetaminophen **OR** acetaminophen, albuterol, bisacodyl, hydrocortisone, HYDROmorphone (DILAUDID) injection, ketorolac, LORazepam **OR** LORazepam, ondansetron **OR** ondansetron (ZOFRAN) IV, sodium chloride flush  Antibiotics: Anti-infectives (From admission, onward)    Start     Dose/Rate Route Frequency Ordered Stop   04/24/22 0000  amoxicillin-clavulanate (AUGMENTIN) 875-125 MG tablet  Status:  Discontinued        1 tablet Oral Every 12 hours 04/24/22 0020 04/24/22        Objective:  Vital Signs  Vitals:   04/26/22 0811 04/26/22 1952 04/27/22 0547 04/27/22 0816  BP:  132/81 (!) 118/50   Pulse:  81  80   Resp:  20 16   Temp:  97.6 F (36.4 C) 97.9 F (36.6 C)   TempSrc:  Oral Oral   SpO2: 94% 92% (!) 89% 93%  Weight:      Height:        Intake/Output Summary (Last 24 hours) at 04/27/2022 0836 Last data filed at 04/27/2022 0132 Gross per 24 hour  Intake 1040.85 ml  Output 1275 ml  Net -234.15 ml    Filed Weights   04/23/22 1511  Weight: 45.8 kg    General appearance: Awake alert.  In no distress Resp: Few crackles bilateral bases.  No wheezing appreciated. Cardio: S1-S2 is normal regular.  No S3-S4.  No rubs murmurs or bruit GI: Abdomen is soft.  Nontender nondistended.  Bowel sounds are present normal.  No masses organomegaly Extremities: No edema.  Moving all 4 extremities Neurologic:  No focal neurological deficits.    Lab Results:  Data Reviewed: I have personally reviewed following labs and reports of the imaging studies  CBC: Recent Labs  Lab 04/23/22 1846 04/25/22 0621 04/27/22 0524  WBC 10.7* 5.9 4.5  NEUTROABS 9.0*  --   --   HGB 14.0 13.3 12.1  HCT 44.5 41.5 38.2  MCV 94.1 96.3 97.4  PLT 187 141* 97*     Basic Metabolic  Panel: Recent Labs  Lab 04/23/22 1846 04/25/22 0621 04/27/22 0524  NA 127* 133* 131*  K 4.6 4.3 4.3  CL 95* 106 104  CO2 24 19* 23  GLUCOSE 86 81 66*  BUN 9 7* 6*  CREATININE 0.57 0.49 0.43*  CALCIUM 10.1 9.3 8.6*  MG  --  2.3 2.3     GFR: Estimated Creatinine Clearance: 46.6 mL/min (A) (by C-G formula based on SCr of 0.43 mg/dL (L)).  Liver Function Tests: Recent Labs  Lab 04/23/22 1846 04/25/22 0621  AST 28 22  ALT 22 20  ALKPHOS 209* 173*  BILITOT 0.6 0.8  PROT 7.6 6.5  ALBUMIN 3.7 3.1*     Recent Labs  Lab 04/23/22 1846  LIPASE 29     Coagulation Profile: Recent Labs  Lab 04/25/22 0621  INR 1.1    Thyroid Function Tests: Recent Labs    04/24/22 1020 04/25/22 0621  TSH 6.181*  --   FREET4  --  1.12      Radiology Studies: No results found.     LOS: 2 days   [facility] Pager on [email or web address]  04/27/2022, 8:36 AM

## 2022-04-27 NOTE — Progress Notes (Signed)
Pt has awoken to confusion. Oriented to self only. Continues to try to get OOB. Haldol given per one time dose.  Will continue to monitor.

## 2022-04-27 NOTE — Progress Notes (Signed)
GI notified. Dr. Benson Norway says colonoscopy cancelled for today and will do tomorrow.

## 2022-04-27 NOTE — Progress Notes (Signed)
Have alerted MD to new onset confusion. Pt hallucinating and talking to people that are not in the room. Have given PO Ativan per order just recent. MD has ordered one time dose of Haldol. Will give as ordered, unless pt falls asleep with Ativan. MD has come to bedside to see pt. Monitoring continues.

## 2022-04-27 NOTE — Progress Notes (Signed)
Initial Nutrition Assessment  DOCUMENTATION CODES:   Underweight  INTERVENTION:  - Monitor for diet advancement - Will add nutrition supplements once diet advances  NUTRITION DIAGNOSIS:   Increased nutrient needs related to acute illness as evidenced by estimated needs.  GOAL:   Patient will meet greater than or equal to 90% of their needs  MONITOR:   PO intake, Diet advancement, Labs, Weight trends  REASON FOR ASSESSMENT:   Malnutrition Screening Tool    ASSESSMENT:   Pt admitted with constipation, noted to have colitis. Pt with multiple ED visits within the last few weeks d/t constipation. PMH significant for HTN, bronchiectasis, bipolar disorder and chronic back pain.  Per MD, CT of chest concerning for lung masses. Bone scan concerning for osseous metastatic process. Plans for further outpatient work up once acute GI issues are managed.   Per GI, plans for colonoscopy. Postponed d/t inability to complete prep.  Spoke with pt via phone call to room. Pt was hard to understand. She reports having been on a clear liquid diet for about 5 days but did not provide additional nutrition history. She is agreeable to resume nutrition supplements once her diet advances.  Per review of chart, pt reports a 10 lb wt loss within the last 2-3 weeks. Reviewed wt hx. There is limited documentation of recent wt's however pt noted to have had a 7.4% wt loss from 09/26/21-04/23/22 which is not clinically significant for time frame.   Suspect a degree of malnutrition to be present given underweight BMI however unable to diagnose at this time. Will reassess once able to obtain more detailed nutrition history and nutrition physical exam.   Medications: folvite, MVI, senna, thiamine IV drips: D5 in NaCl @ 33ml/hr  Labs: sodium 131, BUN 6, Cr 0.43, anion gap 4  NUTRITION - FOCUSED PHYSICAL EXAM: RD working remotely. Deferred to follow up.  Diet Order:   Diet Order             Diet NPO  time specified Except for: Ice Chips, Sips with Meds  Diet effective midnight                   EDUCATION NEEDS:   No education needs have been identified at this time  Skin:  Skin Assessment: Reviewed RN Assessment  Last BM:  5/27 (type 7)  Height:   Ht Readings from Last 1 Encounters:  04/23/22 5\' 3"  (1.6 m)    Weight:   Wt Readings from Last 1 Encounters:  04/23/22 45.8 kg   BMI:  Body mass index is 17.89 kg/m.  Estimated Nutritional Needs:   Kcal:  1400-1600  Protein:  70-85g  Fluid:  >/=1.5L  Clayborne Dana, RDN, LDN Clinical Nutrition

## 2022-04-27 NOTE — Progress Notes (Signed)
OT Cancellation Note  Patient Details Name: Catherine Kelley MRN: [id number] DOB: 1951/09/07   Cancelled Treatment:    Reason Eval/Treat Not Completed: Other (comment) Patient is on the phone at this time. OT to continue to follow and check back as schedule will allow.  Jackelyn Poling OTR/L, [facility] Acute Rehabilitation Department Office# [phone] Pager# [phone] 04/27/2022, 3:03 PM

## 2022-04-27 NOTE — Progress Notes (Signed)
Pt has just finished the 4070ml of Nulytely which she started last night at 1700.  She is very fearful of having to finish another 4040ml of another Nulytely container.  Will give her the next Nulytely container to start this afternoon.  She is still pooping large amounts of liquid brown stool on BSC throughout this shift. Will continue to monitor and encourage patient.

## 2022-04-27 NOTE — Progress Notes (Signed)
Pt continues to have brown bowel movements this am. States that she cannot drink all the prep.

## 2022-04-27 NOTE — Plan of Care (Signed)

## 2022-04-27 NOTE — H&P (View-Only) (Signed)
Subjective: No acute events.  The patient did not complete the prep.  Objective: Vital signs in last 24 hours: Temp:  [97.6 F (36.4 C)-97.9 F (36.6 C)] 97.9 F (36.6 C) (05/28 0547) Pulse Rate:  [80-81] 80 (05/28 0547) Resp:  [16-20] 16 (05/28 0547) BP: (118-132)/(50-81) 118/50 (05/28 0547) SpO2:  [89 %-93 %] 93 % (05/28 1047)    Intake/Output from previous day: 05/27 0701 - 05/28 0700 In: 1040.9 [I.V.:1040.9] Out: 1675 [Urine:1675] Intake/Output this shift: Total I/O In: -  Out: 500 [Urine:500]  General appearance: alert and no distress GI: soft, non-tender; bowel sounds normal; no masses,  no organomegaly  Lab Results: Recent Labs    04/25/22 0621 04/27/22 0524  WBC 5.9 4.5  HGB 13.3 12.1  HCT 41.5 38.2  PLT 141* 97*   BMET Recent Labs    04/25/22 0621 04/27/22 0524  NA 133* 131*  K 4.3 4.3  CL 106 104  CO2 19* 23  GLUCOSE 81 66*  BUN 7* 6*  CREATININE 0.49 0.43*  CALCIUM 9.3 8.6*   LFT Recent Labs    04/25/22 0621  PROT 6.5  ALBUMIN 3.1*  AST 22  ALT 20  ALKPHOS 173*  BILITOT 0.8   PT/INR Recent Labs    04/25/22 0621  LABPROT 13.8  INR 1.1   Hepatitis Panel No results for input(s): HEPBSAG, HCVAB, HEPAIGM, HEPBIGM in the last 72 hours. C-Diff No results for input(s): CDIFFTOX in the last 72 hours. Fecal Lactopherrin No results for input(s): FECLLACTOFRN in the last 72 hours.  Studies/Results: DG CHEST PORT 1 VIEW  Result Date: 04/27/2022 CLINICAL DATA:  Cough.  History of COPD and hypertension. EXAM: PORTABLE CHEST 1 VIEW COMPARISON:  11/08/2020 FINDINGS: Normal heart size. Again noted is chronic bronchiectasis within the right lower lobe. New multifocal bilateral airspace opacities within both upper lobes. Suspect small right pleural effusion. Mild interstitial edema noted. IMPRESSION: 1. New bilateral upper lobe airspace opacities compatible with multifocal pneumonia. Followup PA and lateral chest X-ray is recommended in 3-4 weeks  following trial of antibiotic therapy to ensure resolution and exclude underlying malignancy. 2. Mild interstitial edema and small right pleural effusion. 3. Chronic right lower lobe bronchiectasis. Electronically Signed   By: Kerby Moors M.D.   On: 04/27/2022 10:16    Medications: Scheduled:  amLODipine  2.5 mg Oral Daily   amoxicillin-clavulanate  1 tablet Oral Q12H   arformoterol  15 mcg Nebulization BID   feeding supplement  1 Container Oral TID BM   fluticasone  2 spray Each Nare Daily   folic acid  1 mg Oral Daily   lidocaine  1 patch Transdermal Q24H   lithium carbonate  300 mg Oral QPM   lithium carbonate  600 mg Oral Daily   loratadine  10 mg Oral Daily   multivitamin with minerals  1 tablet Oral Daily   nicotine  14 mg Transdermal Daily   nortriptyline  50 mg Oral QHS   polyethylene glycol-electrolytes  4,000 mL Oral Once   revefenacin  175 mcg Nebulization Daily   senna  2 tablet Oral BID   sodium chloride flush  3 mL Intravenous Q12H   thiamine  100 mg Oral Daily   Or   thiamine  100 mg Intravenous Daily   venlafaxine XR  75 mg Oral Q breakfast   Continuous:  sodium chloride     sodium chloride     sodium chloride     dextrose 5 % and 0.45% NaCl 50  mL/hr at 04/27/22 0857    Assessment/Plan: 1) Chronic constipation. 2) Proctitis noted on the CT scan.   It was not unexpected that she did not complete the prep.  With her severe constipation a second attempt will be pursued to clean out her colon.  Plan: 1) Resume/repeat prep. 2) Colonoscopy tomorrow.  LOS: 2 days   Clydia Nieves D 04/27/2022, 1:07 PM

## 2022-04-27 NOTE — Progress Notes (Signed)
Subjective: No acute events.  The patient did not complete the prep.  Objective: Vital signs in last 24 hours: Temp:  [97.6 F (36.4 C)-97.9 F (36.6 C)] 97.9 F (36.6 C) (05/28 0547) Pulse Rate:  [80-81] 80 (05/28 0547) Resp:  [16-20] 16 (05/28 0547) BP: (118-132)/(50-81) 118/50 (05/28 0547) SpO2:  [89 %-93 %] 93 % (05/28 1047)    Intake/Output from previous day: 05/27 0701 - 05/28 0700 In: 1040.9 [I.V.:1040.9] Out: 1675 [Urine:1675] Intake/Output this shift: Total I/O In: -  Out: 500 [Urine:500]  General appearance: alert and no distress GI: soft, non-tender; bowel sounds normal; no masses,  no organomegaly  Lab Results: Recent Labs    04/25/22 0621 04/27/22 0524  WBC 5.9 4.5  HGB 13.3 12.1  HCT 41.5 38.2  PLT 141* 97*   BMET Recent Labs    04/25/22 0621 04/27/22 0524  NA 133* 131*  K 4.3 4.3  CL 106 104  CO2 19* 23  GLUCOSE 81 66*  BUN 7* 6*  CREATININE 0.49 0.43*  CALCIUM 9.3 8.6*   LFT Recent Labs    04/25/22 0621  PROT 6.5  ALBUMIN 3.1*  AST 22  ALT 20  ALKPHOS 173*  BILITOT 0.8   PT/INR Recent Labs    04/25/22 0621  LABPROT 13.8  INR 1.1   Hepatitis Panel No results for input(s): HEPBSAG, HCVAB, HEPAIGM, HEPBIGM in the last 72 hours. C-Diff No results for input(s): CDIFFTOX in the last 72 hours. Fecal Lactopherrin No results for input(s): FECLLACTOFRN in the last 72 hours.  Studies/Results: DG CHEST PORT 1 VIEW  Result Date: 04/27/2022 CLINICAL DATA:  Cough.  History of COPD and hypertension. EXAM: PORTABLE CHEST 1 VIEW COMPARISON:  11/08/2020 FINDINGS: Normal heart size. Again noted is chronic bronchiectasis within the right lower lobe. New multifocal bilateral airspace opacities within both upper lobes. Suspect small right pleural effusion. Mild interstitial edema noted. IMPRESSION: 1. New bilateral upper lobe airspace opacities compatible with multifocal pneumonia. Followup PA and lateral chest X-ray is recommended in 3-4 weeks  following trial of antibiotic therapy to ensure resolution and exclude underlying malignancy. 2. Mild interstitial edema and small right pleural effusion. 3. Chronic right lower lobe bronchiectasis. Electronically Signed   By: Kerby Moors M.D.   On: 04/27/2022 10:16    Medications: Scheduled:  amLODipine  2.5 mg Oral Daily   amoxicillin-clavulanate  1 tablet Oral Q12H   arformoterol  15 mcg Nebulization BID   feeding supplement  1 Container Oral TID BM   fluticasone  2 spray Each Nare Daily   folic acid  1 mg Oral Daily   lidocaine  1 patch Transdermal Q24H   lithium carbonate  300 mg Oral QPM   lithium carbonate  600 mg Oral Daily   loratadine  10 mg Oral Daily   multivitamin with minerals  1 tablet Oral Daily   nicotine  14 mg Transdermal Daily   nortriptyline  50 mg Oral QHS   polyethylene glycol-electrolytes  4,000 mL Oral Once   revefenacin  175 mcg Nebulization Daily   senna  2 tablet Oral BID   sodium chloride flush  3 mL Intravenous Q12H   thiamine  100 mg Oral Daily   Or   thiamine  100 mg Intravenous Daily   venlafaxine XR  75 mg Oral Q breakfast   Continuous:  sodium chloride     sodium chloride     sodium chloride     dextrose 5 % and 0.45% NaCl 50  mL/hr at 04/27/22 0857    Assessment/Plan: 1) Chronic constipation. 2) Proctitis noted on the CT scan.   It was not unexpected that she did not complete the prep.  With her severe constipation a second attempt will be pursued to clean out her colon.  Plan: 1) Resume/repeat prep. 2) Colonoscopy tomorrow.  LOS: 2 days   Thimothy Barretta D 04/27/2022, 1:07 PM

## 2022-04-28 ENCOUNTER — Encounter (HOSPITAL_COMMUNITY): Payer: Self-pay | Admitting: Internal Medicine

## 2022-04-28 ENCOUNTER — Encounter (HOSPITAL_COMMUNITY)
Admission: EM | Disposition: A | Discharge status: Home Health | Payer: Self-pay | Source: Home / Self Care | Attending: Internal Medicine

## 2022-04-28 ENCOUNTER — Inpatient Hospital Stay (HOSPITAL_COMMUNITY): Payer: Medicare PPO | Admitting: Certified Registered Nurse Anesthetist

## 2022-04-28 DIAGNOSIS — C7951 Secondary malignant neoplasm of bone: Secondary | ICD-10-CM | POA: Diagnosis not present

## 2022-04-28 DIAGNOSIS — J449 Chronic obstructive pulmonary disease, unspecified: Secondary | ICD-10-CM | POA: Diagnosis not present

## 2022-04-28 DIAGNOSIS — F17210 Nicotine dependence, cigarettes, uncomplicated: Secondary | ICD-10-CM

## 2022-04-28 DIAGNOSIS — K5520 Angiodysplasia of colon without hemorrhage: Secondary | ICD-10-CM

## 2022-04-28 DIAGNOSIS — I10 Essential (primary) hypertension: Secondary | ICD-10-CM

## 2022-04-28 DIAGNOSIS — K621 Rectal polyp: Secondary | ICD-10-CM

## 2022-04-28 DIAGNOSIS — K5900 Constipation, unspecified: Secondary | ICD-10-CM | POA: Diagnosis not present

## 2022-04-28 DIAGNOSIS — R051 Acute cough: Secondary | ICD-10-CM | POA: Diagnosis not present

## 2022-04-28 HISTORY — PX: FOREIGN BODY REMOVAL: SHX962

## 2022-04-28 HISTORY — PX: COLONOSCOPY: SHX5424

## 2022-04-28 HISTORY — PX: BIOPSY: SHX5522

## 2022-04-28 LAB — BASIC METABOLIC PANEL
Anion gap: 3 — ABNORMAL LOW (ref 5–15)
BUN: <5 mg/dL — ABNORMAL LOW (ref 8–23)
CO2: 25 mmol/L (ref 22–32)
Calcium: 8.7 mg/dL — ABNORMAL LOW (ref 8.9–10.3)
Chloride: 108 mmol/L (ref 98–111)
Creatinine, Ser: <0.3 mg/dL — ABNORMAL LOW (ref 0.44–1.00)
Glucose, Bld: 94 mg/dL (ref 70–99)
Potassium: 3.7 mmol/L (ref 3.5–5.1)
Sodium: 136 mmol/L (ref 135–145)

## 2022-04-28 LAB — CBC
HCT: 38.3 % (ref 36.0–46.0)
Hemoglobin: 12.3 g/dL (ref 12.0–15.0)
MCH: 30.9 pg (ref 26.0–34.0)
MCHC: 32.1 g/dL (ref 30.0–36.0)
MCV: 96.2 fL (ref 80.0–100.0)
Platelets: 103 10*3/uL — ABNORMAL LOW (ref 150–400)
RBC: 3.98 MIL/uL (ref 3.87–5.11)
RDW: 13.8 % (ref 11.5–15.5)
WBC: 3.6 10*3/uL — ABNORMAL LOW (ref 4.0–10.5)
nRBC: 0 % (ref 0.0–0.2)

## 2022-04-28 LAB — IMMUNOGLOBULINS A/E/G/M, SERUM
IgA: 245 mg/dL (ref 64–422)
IgE (Immunoglobulin E), Serum: 68 IU/mL (ref 6–495)
IgG (Immunoglobin G), Serum: 1397 mg/dL (ref 586–1602)
IgM (Immunoglobulin M), Srm: 94 mg/dL (ref 26–217)

## 2022-04-28 LAB — GLUCOSE, CAPILLARY
Glucose-Capillary: 102 mg/dL — ABNORMAL HIGH (ref 70–99)
Glucose-Capillary: 120 mg/dL — ABNORMAL HIGH (ref 70–99)
Glucose-Capillary: 223 mg/dL — ABNORMAL HIGH (ref 70–99)
Glucose-Capillary: 92 mg/dL (ref 70–99)

## 2022-04-28 LAB — HEPARIN INDUCED PLATELET AB (HIT ANTIBODY): Heparin Induced Plt Ab: 0.119 OD (ref 0.000–0.400)

## 2022-04-28 SURGERY — COLONOSCOPY
Anesthesia: Monitor Anesthesia Care

## 2022-04-28 MED ORDER — PROPOFOL 10 MG/ML IV BOLUS
INTRAVENOUS | Status: DC | PRN
  Administered 2022-04-28 (×4): 20 mg via INTRAVENOUS

## 2022-04-28 MED ORDER — LIDOCAINE 2% (20 MG/ML) 5 ML SYRINGE
INTRAMUSCULAR | Status: DC | PRN
  Administered 2022-04-28: 100 mg via INTRAVENOUS

## 2022-04-28 MED ORDER — POLYETHYLENE GLYCOL 3350 17 G PO PACK
17.0000 g | PACK | Freq: Every day | ORAL | Status: DC
  Filled 2022-04-28: qty 1

## 2022-04-28 MED ORDER — PROPOFOL 500 MG/50ML IV EMUL
INTRAVENOUS | Status: DC | PRN
  Administered 2022-04-28: 100 ug/kg/min via INTRAVENOUS

## 2022-04-28 MED ORDER — LACTATED RINGERS IV SOLN
INTRAVENOUS | Status: AC | PRN
  Administered 2022-04-28: 1000 mL via INTRAVENOUS

## 2022-04-28 MED ORDER — PROPOFOL 1000 MG/100ML IV EMUL
INTRAVENOUS | Status: AC
  Filled 2022-04-28: qty 100

## 2022-04-28 SURGICAL SUPPLY — 22 items

## 2022-04-28 NOTE — Progress Notes (Signed)
[facility] PROGRESS NOTE   Catherine Kelley BOF:[id number] DOB: 06-Oct-1951 DOA: 04/23/2022  PCP: Maximiano Coss, NP  Brief History/Interval Summary: 71 y.o. female with past medical history of essential hypertension, bronchiectasis, bipolar disorder, chronic back pain who has had multiple visits to the emergency department and her PCP for constipation over the last few weeks.  She presented again with similar issues.  Also reported a weight loss of 10 pounds.  CT scan revealed progressive sclerotic lesions in her vertebrae.  She was hospitalized for further management.  Consultants: Gastroenterology.  Pulmonology.  Procedures: None yet    Subjective/Interval History: Patient hard of hearing.  Patient noted to have some confusion yesterday afternoon.  But she is noted to be awake alert and oriented this morning.  Had multiple bowel movements in the last 24 hours.  Still brownish in color.  Does not appear to be short of breath.   Assessment/Plan:  Severe constipation Etiology is unclear.  Patient was ordered laxative and stool softeners.  She had to be disimpacted.  TSH noted to be mildly elevated at 6.18.  Free T4 is normal.  Recommend rechecking thyroid function test in a few weeks.  Unlikely that thyroid dysfunction is the reason for her constipation. Gastroenterology was consulted due to relatively sudden onset of constipation.  Plan was for colonoscopy.  However due to poor prep but this had not to be postponed.  Hopeful for colonoscopy today. Continue bowel regimen.  Aspiration pneumonia/hypoxia Was requiring some oxygen yesterday.  Chest x-ray was repeated which suggested pneumonia.  However findings in similar location to the findings noted on the recently done CT chest.  However since her cough is new she was placed on Augmentin which we will continue for now.  She has been seen by speech therapy.  To decide on diet once GI work-up is completed.  Thrombocytopenia/concern  for HIT Drop in platelet counts noted.  More than 50% drop.  She was noted to be on Lovenox.  This was discontinued.  HIT panel is pending.  Platelet count is stable this morning.  Continue just SCDs.    Transient delirium Patient noted to have symptoms suggestive of delirium yesterday.  Was given Haldol.  Noted to be alert and oriented to place year month.  No focal deficits noted.  Possibly due to hospital stay.  She is also an alcoholic.  Continue thiamine.  Hypoglycemia Likely due to n.p.o. status.  Started on D5 infusion.  Glucose levels are stable.  Vertebral lesions, sclerotic Multiple lesions noted in the thoracic and lumbar vertebrae.  These are sclerotic as per CT report.  Noted previously in 2021 as well but they appear to have progressed.  It does not appear the patient has had any work-up for these.  She tells me that she is up-to-date on her mammograms.  Her last mammogram in our system was in 2021 which was unremarkable.  She mentions that she is due for another mammogram this year. Her calcium level noted to be normal along with normal albumin. CT scan of the chest does raise concern for lung masses.  See below. Bone scan also revealed sclerotic lesions from T11-L3 raising concern for osseous metastatic process. She will need further work-up for these but can be pursued in the outpatient setting once her acute GI issues have been dealt with.   History of bronchiectasis/COPD/abnormal CT chest It appears that she was previously followed by Dr. Chase Caller.  Has not seen them in several years.  CT of the chest reveals significant bronchiectasis with concern for lung masses. Seen by pulmonology.  They have prescribed nebulizer treatments.  They recommend outpatient evaluation of the masses in the next week or so.  She will also need a PET scan.  They are working on getting her an appointment to see Dr. Chase Caller.   Has had a few COPD exacerbations in the past few months.  Has been on  prednisone a few times.  Continues to smoke cigarettes. See above regarding aspiration pneumonia.  Tobacco abuse Counseled.  Nicotine patch  Alcohol abuse Drinks 3 times a week.  Started on CIWA protocol.  Continue thiamine multivitamins folic acid.    Hyponatremia Probably due to hypovolemia with some component of SIADH considering her significant bronchiectases.  Urine osmolality was noted to be elevated.  Sodium level has improved.  Essential hypertension Continue amlodipine.   Bipolar disorder Followed by psychiatry.  Noted to be on lithium, nortriptyline and venlafaxine.  Lithium level was 1.2 on 5/25.   Due to the fact that she had tremors lithium level was rechecked and noted to be 1.14 on 5/28.  Urinary retention Likely due to constipation and immobility.  She has allergy to sulfa drugs with cross-reactivity.  We will hold off on prescribing Flomax.   Foley catheter was considered.  However patient has been able to void on her own and has not required any further in and out catheterization.  Continue to monitor.    DVT Prophylaxis: Lovenox Code Status: Full code Family Communication: Husband was updated 2 days ago.  Will wait on colonoscopy to be completed before updating him again.   Disposition Plan: Hopefully return home once GI work-up is completed and once her constipation has resolved.  Status is: Inpatient Remains inpatient appropriate because: Needs GI work-up for sudden onset constipation      Medications: Scheduled:  amLODipine  2.5 mg Oral Daily   amoxicillin-clavulanate  1 tablet Oral Q12H   arformoterol  15 mcg Nebulization BID   feeding supplement  1 Container Oral TID BM   fluticasone  2 spray Each Nare Daily   folic acid  1 mg Oral Daily   lidocaine  1 patch Transdermal Q24H   lithium carbonate  300 mg Oral QPM   lithium carbonate  600 mg Oral Daily   loratadine  10 mg Oral Daily   multivitamin with minerals  1 tablet Oral Daily   nicotine  14 mg  Transdermal Daily   nortriptyline  50 mg Oral QHS   revefenacin  175 mcg Nebulization Daily   senna  2 tablet Oral BID   sodium chloride flush  3 mL Intravenous Q12H   thiamine  100 mg Oral Daily   Or   thiamine  100 mg Intravenous Daily   venlafaxine XR  75 mg Oral Q breakfast   Continuous:  sodium chloride     sodium chloride     sodium chloride     dextrose 5 % and 0.45% NaCl 50 mL/hr at 04/27/22 0857   POE:UMPNTI chloride, acetaminophen **OR** acetaminophen, albuterol, bisacodyl, hydrocortisone, HYDROmorphone (DILAUDID) injection, ketorolac, LORazepam **OR** LORazepam, ondansetron **OR** ondansetron (ZOFRAN) IV, sodium chloride flush  Antibiotics: Anti-infectives (From admission, onward)    Start     Dose/Rate Route Frequency Ordered Stop   04/27/22 1145  amoxicillin-clavulanate (AUGMENTIN) 875-125 MG per tablet 1 tablet        1 tablet Oral Every 12 hours 04/27/22 1047     04/24/22 0000  amoxicillin-clavulanate (  AUGMENTIN) 875-125 MG tablet  Status:  Discontinued        1 tablet Oral Every 12 hours 04/24/22 0020 04/24/22        Objective:  Vital Signs  Vitals:   04/27/22 2024 04/27/22 2109 04/28/22 0638 04/28/22 0728  BP:  (!) 133/57 (!) 117/55   Pulse:  86 79   Resp:  18 15   Temp:  97.7 F (36.5 C) 98.2 F (36.8 C)   TempSrc:  Oral Oral   SpO2: 93% 100% 93% 90%  Weight:      Height:        Intake/Output Summary (Last 24 hours) at 04/28/2022 0919 Last data filed at 04/28/2022 0341 Gross per 24 hour  Intake 1531.06 ml  Output 1100 ml  Net 431.06 ml    Filed Weights   04/23/22 1511  Weight: 45.8 kg    General appearance: Awake alert.  In no distress Resp: Crackles bilateral bases.  No wheezing or rhonchi. Cardio: S1-S2 is normal regular.  No S3-S4.  No rubs murmurs or bruit GI: Abdomen is soft.  Nontender nondistended.  Bowel sounds are present normal.  No masses organomegaly Extremities: No edema.  Full range of motion of lower  extremities. Neurologic: Alert and oriented x2.  No focal neurological deficits.     Lab Results:  Data Reviewed: I have personally reviewed following labs and reports of the imaging studies  CBC: Recent Labs  Lab 04/23/22 1846 04/25/22 0621 04/27/22 0524 04/28/22 0451  WBC 10.7* 5.9 4.5 3.6*  NEUTROABS 9.0*  --   --   --   HGB 14.0 13.3 12.1 12.3  HCT 44.5 41.5 38.2 38.3  MCV 94.1 96.3 97.4 96.2  PLT 187 141* 97* 103*     Basic Metabolic Panel: Recent Labs  Lab 04/23/22 1846 04/25/22 0621 04/27/22 0524 04/28/22 0451  NA 127* 133* 131* 136  K 4.6 4.3 4.3 3.7  CL 95* 106 104 108  CO2 24 19* 23 25  GLUCOSE 86 81 66* 94  BUN 9 7* 6* <5*  CREATININE 0.57 0.49 0.43* <0.30*  CALCIUM 10.1 9.3 8.6* 8.7*  MG  --  2.3 2.3  --      GFR: CrCl cannot be calculated (This lab value cannot be used to calculate CrCl because it is not a number: <0.30).  Liver Function Tests: Recent Labs  Lab 04/23/22 1846 04/25/22 0621  AST 28 22  ALT 22 20  ALKPHOS 209* 173*  BILITOT 0.6 0.8  PROT 7.6 6.5  ALBUMIN 3.7 3.1*     Recent Labs  Lab 04/23/22 1846  LIPASE 29     Coagulation Profile: Recent Labs  Lab 04/25/22 0621  INR 1.1     Radiology Studies: DG CHEST PORT 1 VIEW  Result Date: 04/27/2022 CLINICAL DATA:  Cough.  History of COPD and hypertension. EXAM: PORTABLE CHEST 1 VIEW COMPARISON:  11/08/2020 FINDINGS: Normal heart size. Again noted is chronic bronchiectasis within the right lower lobe. New multifocal bilateral airspace opacities within both upper lobes. Suspect small right pleural effusion. Mild interstitial edema noted. IMPRESSION: 1. New bilateral upper lobe airspace opacities compatible with multifocal pneumonia. Followup PA and lateral chest X-ray is recommended in 3-4 weeks following trial of antibiotic therapy to ensure resolution and exclude underlying malignancy. 2. Mild interstitial edema and small right pleural effusion. 3. Chronic right lower  lobe bronchiectasis. Electronically Signed   By: Kerby Moors M.D.   On: 04/27/2022 10:16  LOS: 3 days   Shraddha Lebron [employer] on [email or web address]  04/28/2022, 9:19 AM

## 2022-04-28 NOTE — Interval H&P Note (Signed)
History and Physical Interval Note:  04/28/2022 12:19 PM  Catherine Kelley  has presented today for surgery, with the diagnosis of Abnormal CT scan.  The various methods of treatment have been discussed with the patient and family. After consideration of risks, benefits and other options for treatment, the patient has consented to  Procedure(s): COLONOSCOPY WITH PROPOFOL (N/A) as a surgical intervention.  The patient's history has been reviewed, patient examined, no change in status, stable for surgery.  I have reviewed the patient's chart and labs.  Questions were answered to the patient's satisfaction.     Kabrina Christiano D

## 2022-04-28 NOTE — Anesthesia Preprocedure Evaluation (Addendum)
Anesthesia Evaluation  Patient identified by MRN, date of birth, ID band Patient awake    Reviewed: Allergy & Precautions, NPO status , Patient's Chart, lab work & pertinent test results, reviewed documented beta blocker date and time   Airway Mallampati: II  TM Distance: >3 FB Neck ROM: Full    Dental no notable dental hx. (+) Edentulous Upper, Edentulous Lower   Pulmonary asthma , pneumonia, resolved, COPD,  COPD inhaler, Current Smoker and Patient abstained from smoking.,  Subpleural mass RUL   Pulmonary exam normal breath sounds clear to auscultation       Cardiovascular hypertension, Pt. on medications Normal cardiovascular exam+ Valvular Problems/Murmurs  Rhythm:Regular Rate:Normal  EKG 04/17/22 NSR, biatrial enlargement  Echo 09/18/2017 Left ventricle: The cavity size was normal. Wall thickness was normal. Systolic function was normal. The estimated ejection fraction was in the range of 60% to 65%. Wall motion was normal; there were no regional wall motion abnormalities. Doppler parameters are consistent with abnormal left ventricular relaxation (grade 1 diastolic dysfunction).  - Mitral valve: Calcified annulus.   Neuro/Psych  Headaches, PSYCHIATRIC DISORDERS Anxiety Depression Bipolar Disorder Bilateral HOH  Neuromuscular disease    GI/Hepatic GERD  Medicated,Elevated Alk Phos Fecal impaction this admission Constipation Proctitis on CT scan   Endo/Other  Hyperlipidemia Osteoporosis  Renal/GU negative Renal ROS Bladder dysfunction  Urinary retention    Musculoskeletal  (+) Arthritis , Osteoarthritis,  Chronic LBP with radiculopathy Thoracic and lumbar spine lesions ? Metastatic disease   Abdominal Distended  Peds  Hematology  (+) Blood dyscrasia, , Thrombocytopenia- 103k Plt   Anesthesia Other Findings   Reproductive/Obstetrics                         Anesthesia Physical Anesthesia  Plan  ASA: 3 and emergent  Anesthesia Plan: MAC   Post-op Pain Management: Minimal or no pain anticipated   Induction: Intravenous  PONV Risk Score and Plan: 1 and Treatment may vary due to age or medical condition, Propofol infusion and Ondansetron  Airway Management Planned: Natural Airway and Simple Face Mask  Additional Equipment: None  Intra-op Plan:   Post-operative Plan:   Informed Consent: I have reviewed the patients History and Physical, chart, labs and discussed the procedure including the risks, benefits and alternatives for the proposed anesthesia with the patient or authorized representative who has indicated his/her understanding and acceptance.     Dental advisory given  Plan Discussed with: CRNA and Anesthesiologist  Anesthesia Plan Comments: (Patient did not complete the prep yesterday.)      Anesthesia Quick Evaluation

## 2022-04-28 NOTE — Progress Notes (Signed)
SLP Cancellation Note  Patient Details Name: Catherine Kelley MRN: [id number] DOB: [birth year]   Cancelled treatment:       Reason Eval/Treat Not Completed:  (pt npo at this time; note mental status changes, will continue efforts)  Kathleen Lime, MS [place] Office [phone] Pager [phone]   Macario Golds 04/28/2022, 11:12 AM

## 2022-04-28 NOTE — Progress Notes (Signed)
Physical Therapy Treatment Patient Details Name: Catherine Kelley MRN: [id number] DOB: [birth year] Today's Date: 04/28/2022   History of Present Illness 71 y.o. female with past medical history of essential hypertension, bronchiectasis, bipolar disorder, chronic back pain who has had multiple visits to the emergency department and her PCP for constipation over the last few weeks.  She presented again with similar issues.  Also reported a weight loss of 10 pounds.  CT scan revealed progressive sclerotic lesions in her vertebrae.    PT Comments    Pt sitting up in bed drinking her bowel prep upon entry, agreeable to be seen. Pt required min assist for rolling in bed, but supervision for other bed mobility. Pt min guard for sit to stand transfer and min assist for step pivot transfer from bed to [facility] secondary to posterior lean during stepping. During return to bed, pt became incontinent of bowel and required to be cleaned up (min assist for rolling), NT and RN present for pericare, further mobility deferred secondary to incontinence and pt request due to fatigue. Discharge destination remains appropriate, we will continue to follow her acutely.    Recommendations for follow up therapy are one component of a multi-disciplinary discharge planning process, led by the attending physician.  Recommendations may be updated based on patient status, additional functional criteria and insurance authorization.  Follow Up Recommendations  Home health PT     Assistance Recommended at Discharge Set up Supervision/Assistance  Patient can return home with the following A little help with walking and/or transfers;A little help with bathing/dressing/bathroom;Assistance with cooking/housework;Direct supervision/assist for medications management;Assist for transportation;Help with stairs or ramp for entrance   Equipment Recommendations  None recommended by PT    Recommendations for Other Services       Precautions /  Restrictions Precautions Precautions: Fall Precaution Comments: pt reports h/o 1 fall in past 6 months but is unable to recall details Restrictions Weight Bearing Restrictions: No     Mobility  Bed Mobility Overal bed mobility: Needs Assistance Bed Mobility: Sit to Supine, Supine to Sit, Rolling Rolling: Min assist   Supine to sit: Supervision Sit to supine: Supervision   General bed mobility comments: Pt supervision for bed mobility for safety, excepting rolling, pt required min assist to roll R&L for pericare and cleanup. During sit to stand transfer pt exhibiting tremors in all four extremities but denies feeling cold.    Transfers Overall transfer level: Needs assistance Equipment used: Rolling walker (2 wheels) Transfers: Sit to/from Stand, Bed to chair/wheelchair/BSC Sit to Stand: Min guard   Step pivot transfers: Min assist       General transfer comment: Pt min guard for sit to stand transfer, no physical assist required. Pt min assist for step pivot from bed to [facility] secondary to posterior lean during stance. No overt LOB. During step pivot transfer back to bed from [facility] pt incontinent of bowel, returned to bed and NT/RN assisted with pericare and rolling in bed. Further mobility deferred secondary to incontinence and pt fatigue.    Ambulation/Gait               General Gait Details: deferred   Stairs             Wheelchair Mobility    Modified Rankin (Stroke Patients Only)       Balance Overall balance assessment: Needs assistance, History of Falls Sitting-balance support: Feet supported, No upper extremity supported Sitting balance-Leahy Scale: Good   Postural control: Posterior lean Standing balance  support: Bilateral upper extremity supported, During functional activity, Reliant on assistive device for balance Standing balance-Leahy Scale: Poor Standing balance comment: pt able to perform some pericare after BM with one hand on RW                             Cognition Arousal/Alertness: Awake/alert Behavior During Therapy: WFL for tasks assessed/performed Overall Cognitive Status: No family/caregiver present to determine baseline cognitive functioning                                 General Comments: oriented to self, location, month/day/year        Exercises      General Comments        Pertinent Vitals/Pain Pain Assessment Pain Assessment: 0-10 Pain Score: 8  Pain Location: back Pain Descriptors / Indicators: Discomfort Pain Intervention(s): Limited activity within patient's tolerance, Monitored during session, Repositioned    Home Living                          Prior Function            PT Goals (current goals can now be found in the care plan section) Acute Rehab PT Goals Patient Stated Goal: to feel better PT Goal Formulation: With patient Time For Goal Achievement: 05/09/22 Potential to Achieve Goals: Good Progress towards PT goals: Progressing toward goals    Frequency    Min 3X/week      PT Plan Current plan remains appropriate    Co-evaluation              AM-PAC PT "6 Clicks" Mobility   Outcome Measure  Help needed turning from your back to your side while in a flat bed without using bedrails?: None Help needed moving from lying on your back to sitting on the side of a flat bed without using bedrails?: A Little Help needed moving to and from a bed to a chair (including a wheelchair)?: A Little Help needed standing up from a chair using your arms (e.g., wheelchair or bedside chair)?: A Little Help needed to walk in hospital room?: A Little Help needed climbing 3-5 steps with a railing? : A Little 6 Click Score: 19    End of Session Equipment Utilized During Treatment: Gait belt Activity Tolerance: Patient limited by fatigue Patient left: with call bell/phone within reach;in bed;with bed alarm set;with nursing/sitter in room Nurse  Communication: Mobility status PT Visit Diagnosis: Difficulty in walking, not elsewhere classified (R26.2);Unsteadiness on feet (R26.81)     Time: 0768-0881 PT Time Calculation (min) (ACUTE ONLY): 32 min  Charges:  $Therapeutic Activity: 23-37 mins                     Coolidge Breeze, PT, DPT [facility] Department Office: [phone] Pager: [phone]   Coolidge Breeze 04/28/2022, 12:35 PM

## 2022-04-28 NOTE — Anesthesia Procedure Notes (Signed)
Date/Time: 04/28/2022 12:36 PM Performed by: Sharlette Dense, CRNA Oxygen Delivery Method: Simple face mask

## 2022-04-28 NOTE — Op Note (Signed)
[facility] Patient Name: Catherine Kelley Procedure Date: 04/28/2022 MRN: [id number] Attending MD: Carol Ada , MD Date of Birth: Jul 21, 1951 CSN: [id number] Age: 71 Admit Type: Inpatient Procedure:                Colonoscopy Indications:              Abnormal CT of the GI tract Providers:                Carol Ada, MD, William Dalton, Technician,                            Grace Isaac, RN Referring MD:              Medicines:                Propofol per Anesthesia Complications:            No immediate complications. Estimated Blood Loss:     Estimated blood loss: none. Procedure:                Pre-Anesthesia Assessment:                           - Prior to the procedure, a History and Physical                            was performed, and patient medications and                            allergies were reviewed. The patient's tolerance of                            previous anesthesia was also reviewed. The risks                            and benefits of the procedure and the sedation                            options and risks were discussed with the patient.                            All questions were answered, and informed consent                            was obtained. Prior Anticoagulants: The patient has                            taken no previous anticoagulant or antiplatelet                            agents. ASA Grade Assessment: III - A patient with                            severe systemic disease. After reviewing the risks  and benefits, the patient was deemed in                            satisfactory condition to undergo the procedure.                           - Sedation was administered by an anesthesia                            professional. Deep sedation was attained.                           After obtaining informed consent, the colonoscope                            was passed under direct vision. Throughout  the                            procedure, the patient's blood pressure, pulse, and                            oxygen saturations were monitored continuously. The                            PCF-HQ190L ([id number]) Olympus colonoscope was                            introduced through the anus and advanced to the the                            cecum, identified by appendiceal orifice and                            ileocecal valve. The colonoscopy was performed with                            difficulty due to a redundant colon and significant                            looping. Successful completion of the procedure was                            aided by using manual pressure, straightening and                            shortening the scope to obtain bowel loop reduction                            and lavage. The patient tolerated the procedure                            well. The quality of the bowel preparation was  evaluated using the BBPS [facility] Bowel Preparation                            Scale) with scores of: Right Colon = 3 (entire                            mucosa seen well with no residual staining, small                            fragments of stool or opaque liquid), Transverse                            Colon = 3 (entire mucosa seen well with no residual                            staining, small fragments of stool or opaque                            liquid) and Left Colon = 2 (minor amount of                            residual staining, small fragments of stool and/or                            opaque liquid, but mucosa seen well). The total                            BBPS score equals 8. The quality of the bowel                            preparation was good. The ileocecal valve,                            appendiceal orifice, and rectum were photographed. Scope In: 12:41:45 PM Scope Out: 1:16:16 PM Scope Withdrawal Time: 0 hours 6 minutes 53  seconds  Total Procedure Duration: 0 hours 34 minutes 31 seconds  Findings:      A 5 mm polyp was found in the rectum. The polyp was sessile. The polyp       was removed with a cold snare. Resection and retrieval were complete.      Multiple medium-sized patchy angiodysplastic lesions without bleeding       were found in the sigmoid colon, in the descending colon and in the       ascending colon.      Initial intubation of the rectum showed a large stool ball, which was a       large fecal impaction. Disimpaction was performed and the vast majority       of the stool was evacuated. A relook was perfomed to assess the rectal       mucosa, which appeared edematous. This was from the persistent pressure       on the rectal mucosal from the stool impaction. The colonoscope was       advanced further and it was surprising to find that her proximal colon  was very well prepped. The decision was made to perform the colonoscopy.       There was some debris in the cecum, but it was able to be cleared out.       Scattered medium to large AVMs were noted in the ascending, descending,       and sigmoid colon. At the time of the retroflexion, the residual stool       cleared out during the procedure and a polyp was found in the rectum.       This was removed and an adequate retroflexed view was obtained. Her       colon was redundant and torturous. External pressure was required to       achieve cecal intubation. Impression:               - One 5 mm polyp in the rectum, removed with a cold                            snare. Resected and retrieved.                           - Multiple non-bleeding colonic angiodysplastic                            lesions. Moderate Sedation:      Not Applicable - Patient had care per Anesthesia. Recommendation:           - Return patient to hospital ward for ongoing care.                           - Resume regular diet.                           - Continue  present medications.                           - Avoid narcotics.                           - Routine bowel regimen.                           - [facility] to resume care in the AM. Procedure Code(s):        --- Professional ---                           [phone], Colonoscopy, flexible; with removal of                            tumor(s), polyp(s), or other lesion(s) by snare                            technique Diagnosis Code(s):        --- Professional ---                           K62.1, Rectal polyp  K55.20, Angiodysplasia of colon without hemorrhage                           R93.3, Abnormal findings on diagnostic imaging of                            other parts of digestive tract CPT copyright 2019 [employer]. All rights reserved. The codes documented in this report are preliminary and upon coder review may  be revised to meet current compliance requirements. Carol Ada, MD Carol Ada, MD 04/28/2022 1:28:05 PM This report has been signed electronically. Number of Addenda: 0

## 2022-04-28 NOTE — Progress Notes (Signed)
OT Cancellation Note  Patient Details Name: Catherine Kelley MRN: [id number] DOB: Aug 23, 1951   Cancelled Treatment:    Reason Eval/Treat Not Completed: Other (comment) Patient with pending colonoscopy today with recent onset of confusion. Nurse asking for therapy to wait for her to assess patient prior to session. OT to continue to follow and check back as schedule will allow.   Jackelyn Poling OTR/L, [place] [facility] Office# [phone] Pager# [phone]  04/28/2022, 8:20 AM

## 2022-04-28 NOTE — Anesthesia Postprocedure Evaluation (Signed)
Anesthesia Post Note  Patient: Catherine Kelley  Procedure(s) Performed: COLONOSCOPY BIOPSY     Patient location during evaluation: PACU Anesthesia Type: MAC Level of consciousness: awake and alert and oriented Pain management: pain level controlled Vital Signs Assessment: post-procedure vital signs reviewed and stable Respiratory status: spontaneous breathing, nonlabored ventilation and respiratory function stable Cardiovascular status: stable and blood pressure returned to baseline Postop Assessment: no apparent nausea or vomiting Anesthetic complications: no   No notable events documented.  Last Vitals:  Vitals:   04/28/22 1330 04/28/22 1335  BP: (!) 123/50 (!) 130/57  Pulse: 87 87  Resp: 19 (!) 29  Temp:    SpO2: 100% 98%    Last Pain:  Vitals:   04/28/22 1335  TempSrc:   PainSc: 0-No pain                 Jeremias Broyhill A.

## 2022-04-28 NOTE — Transfer of Care (Signed)
Immediate Anesthesia Transfer of Care Note  Patient: Catherine Kelley  Procedure(s) Performed: COLONOSCOPY BIOPSY  Patient Location: Endoscopy Unit  Anesthesia Type:MAC  Level of Consciousness: drowsy  Airway & Oxygen Therapy: Patient Spontanous Breathing and Patient connected to face mask oxygen  Post-op Assessment: Report given to RN and Post -op Vital signs reviewed and stable  Post vital signs: Reviewed and stable  Last Vitals:  Vitals Value Taken Time  BP    Temp    Pulse    Resp    SpO2      Last Pain:  Vitals:   04/28/22 1147  TempSrc: Temporal  PainSc: 8       Patients Stated Pain Goal: 0 (62/22/97 9892)  Complications: No notable events documented.

## 2022-04-29 ENCOUNTER — Encounter (HOSPITAL_COMMUNITY): Payer: Self-pay | Admitting: Gastroenterology

## 2022-04-29 ENCOUNTER — Ambulatory Visit: Payer: Medicare PPO | Admitting: Registered Nurse

## 2022-04-29 DIAGNOSIS — K5900 Constipation, unspecified: Secondary | ICD-10-CM | POA: Diagnosis not present

## 2022-04-29 LAB — CBC
HCT: 37.1 % (ref 36.0–46.0)
Hemoglobin: 11.8 g/dL — ABNORMAL LOW (ref 12.0–15.0)
MCH: 30.6 pg (ref 26.0–34.0)
MCHC: 31.8 g/dL (ref 30.0–36.0)
MCV: 96.4 fL (ref 80.0–100.0)
Platelets: 103 10*3/uL — ABNORMAL LOW (ref 150–400)
RBC: 3.85 MIL/uL — ABNORMAL LOW (ref 3.87–5.11)
RDW: 13.8 % (ref 11.5–15.5)
WBC: 4 10*3/uL (ref 4.0–10.5)
nRBC: 0 % (ref 0.0–0.2)

## 2022-04-29 LAB — BASIC METABOLIC PANEL
Anion gap: 3 — ABNORMAL LOW (ref 5–15)
BUN: 7 mg/dL — ABNORMAL LOW (ref 8–23)
CO2: 25 mmol/L (ref 22–32)
Calcium: 8.6 mg/dL — ABNORMAL LOW (ref 8.9–10.3)
Chloride: 107 mmol/L (ref 98–111)
Creatinine, Ser: 0.44 mg/dL (ref 0.44–1.00)
GFR, Estimated: >60 mL/min (ref >60)
Glucose, Bld: 118 mg/dL — ABNORMAL HIGH (ref 70–99)
Potassium: 3.6 mmol/L (ref 3.5–5.1)
Sodium: 135 mmol/L (ref 135–145)

## 2022-04-29 LAB — GLUCOSE, CAPILLARY: Glucose-Capillary: 104 mg/dL — ABNORMAL HIGH (ref 70–99)

## 2022-04-29 MED ORDER — AMLODIPINE BESYLATE 2.5 MG PO TABS: 2.5000 mg | ORAL_TABLET | Freq: Every day | ORAL | 1 refills | Status: AC

## 2022-04-29 MED ORDER — THIAMINE HCL 100 MG PO TABS
100.0000 mg | ORAL_TABLET | Freq: Every day | ORAL | 2 refills | Status: AC
Start: 2022-04-29 — End: ?

## 2022-04-29 MED ORDER — ALBUTEROL SULFATE HFA 108 (90 BASE) MCG/ACT IN AERS: 2.0000 | INHALATION_SPRAY | Freq: Four times a day (QID) | RESPIRATORY_TRACT | 2 refills | Status: DC | PRN

## 2022-04-29 MED ORDER — AMOXICILLIN-POT CLAVULANATE 875-125 MG PO TABS: 1.0000 | ORAL_TABLET | Freq: Two times a day (BID) | ORAL | 0 refills | Status: AC

## 2022-04-29 MED ORDER — FOLIC ACID 1 MG PO TABS: 1.0000 mg | ORAL_TABLET | Freq: Every day | ORAL | 2 refills | Status: AC

## 2022-04-29 MED ORDER — STIOLTO RESPIMAT 2.5-2.5 MCG/ACT IN AERS: 2.0000 | INHALATION_SPRAY | Freq: Every day | RESPIRATORY_TRACT | 2 refills | Status: DC

## 2022-04-29 MED ORDER — POLYETHYLENE GLYCOL 3350 17 G PO PACK: 17.0000 g | PACK | Freq: Every day | ORAL | 2 refills | Status: AC

## 2022-04-29 MED ORDER — SENNA 8.6 MG PO TABS: 2.0000 | ORAL_TABLET | Freq: Two times a day (BID) | ORAL | 0 refills | Status: AC

## 2022-04-29 NOTE — Telephone Encounter (Signed)
Called Pt to schedule appointment for 6/1-6/2 and Pt declined care due to just getting out of hospital.  Pt states that she can't do anything right now she will call us back when she feels better.  FE

## 2022-04-29 NOTE — Progress Notes (Signed)
PT Cancellation Note  Patient Details Name: Catherine Kelley MRN: [id number] DOB: 1951-03-28   Cancelled Treatment:    Reason Eval/Treat Not Completed: Patient declined, no reason specified. Checked on pt during care with NT, pt agreeable to be seen. After NT finished, pt refused, reporting she will be fine at home.  Pt also reports she has refused home health, stating her husband does not like strangers in the house; encouraged pt that she could see PT at an outpatient setting by getting a referral from PCP if needed. Pt wishes respected, PT is signing off as pt is discharging today. If needs change, please re-consult.  Coolidge Breeze, PT, DPT [facility] Office: [phone] Pager: [phone]  Coolidge Breeze 04/29/2022, 10:56 AM

## 2022-04-29 NOTE — TOC Transition Note (Signed)
Transition of Care [facility]) - CM/SW Discharge Note   Patient Details  Name: Catherine Kelley MRN: [id number] Date of Birth: Jan 22, 1951  Transition of [facility]) CM/SW Contact:  Ross Ludwig, LCSW Phone Number: 04/29/2022, 10:49 AM   Clinical Narrative:     CSW spoke to patient to see if she is interested I HH or substance abuse resources.  Patient stated no she is not interested in either.  Patient does not want any substance abuse resources and she does not want home health.  CSW to sign off, please reconsult if any other social work needs arise.   Final next level of care: Home/Self Care Barriers to Discharge: Barriers Resolved   Patient Goals and CMS Choice Patient states their goals for this hospitalization and ongoing recovery are:: To return back home CMS Medicare.gov Compare Post Acute Care list provided to:: Patient Choice offered to / list presented to : Patient  Discharge Placement                       Discharge Plan and Services                          HH Arranged: Refused HH          Social Determinants of Health (SDOH) Interventions     Readmission Risk Interventions     View : No data to display.

## 2022-04-29 NOTE — Telephone Encounter (Signed)
Catherine Kelley  Last seen by me in 2017. This is essentially new. I do not specialize in lung mass/cancer diagnostics. Happy for you to take over or refer to the appropriate sub=sub specialist as you see fit. Agree Needs PET scan  Thanks    SIGNATURE    Dr. Brand Males, M.D., F.C.C.P,  Pulmonary and Critical Care Medicine Staff Physician, [facility] Director - Interstitial Lung Disease  Program  Medical Director - [facility] [place] at [place], [place], [address]  NPI Number:  NPI #[id number] [facility] Number: [id number]  Pager: [phone], If no answer  -[place] or [place] Telephone (clinical office): [phone] Telephone (research): [phone]  8:59 AM 04/29/2022

## 2022-04-29 NOTE — Progress Notes (Signed)
OT Cancellation Note  Patient Details Name: Catherine Kelley MRN: [id number] DOB: 01/11/1951   Cancelled Treatment:    Reason Eval/Treat Not Completed: Other (comment)pt refused, is discharging today. Pt educated on DME use, home safety. Continues to decline. Pt also reports she has refused home health. Pt was educated on benefits of [place], getting a referral from PCP if needed. Pt wishes respected, will follow while admitted.  Shanon Payor, OTD OTR/L  04/29/22, 10:55 AM

## 2022-04-29 NOTE — Progress Notes (Signed)
Went over discharge instructions w/ pt.

## 2022-04-29 NOTE — Discharge Summary (Signed)
[facility]  Physician Discharge Summary   Patient ID: Catherine Kelley MRN: [id number] DOB/AGE: 07/03/1951 71 y.o.  Admit date: 04/23/2022 Discharge date:   04/29/2022   PCP: Maximiano Coss, NP  DISCHARGE DIAGNOSES:  Principal Problem:   Colitis Active Problems:   Stage 2 moderate COPD by GOLD classification ([place])   Cigarette smoker   Constipation   Lesion of bone of thoracic spine   Bronchiectasis (HCC)   Abnormal CT of the abdomen   Chronic constipation   Change in bowel habits   Loss of weight   Generalized abdominal pain   Fecal impaction (HCC)   Nausea and vomiting   RECOMMENDATIONS FOR OUTPATIENT FOLLOW UP: Pulmonology to arrange outpatient follow-up for the lung lesions Check thyroid function tests in 3 weeks    Home Health: Patient declined home health Equipment/Devices: None  CODE STATUS: Full code  DISCHARGE CONDITION: fair  Diet recommendation: As before  INITIAL HISTORY: 71 y.o. female with past medical history of essential hypertension, bronchiectasis, bipolar disorder, chronic back pain who has had multiple visits to the emergency department and her PCP for constipation over the last few weeks.  She presented again with similar issues.  Also reported a weight loss of 10 pounds.  CT scan revealed progressive sclerotic lesions in her vertebrae.  She was hospitalized for further management.   Consultants: Gastroenterology.  Pulmonology.   Procedures:  Colonoscopy Impression:               - One 5 mm polyp in the rectum, removed with a cold                            snare. Resected and retrieved.                           - Multiple non-bleeding colonic angiodysplastic                            lesions.    HOSPITAL COURSE:   Severe constipation Etiology is unclear.  Patient was ordered laxative and stool softeners.  She had to be disimpacted.  TSH noted to be mildly elevated at 6.18.  Free T4 is normal.  Recommend rechecking thyroid  function test in a few weeks.  Unlikely that thyroid dysfunction is the reason for her constipation. Gastroenterology was consulted due to relatively sudden onset of constipation.  Patient had to be prepped multiple times for her colonoscopy.  Finally a colonoscopy was done on 5/29.  It was a reasonable preparation.  1 polyp was noted.  Multiple nonbleeding chronic angiodysplastic lesions were noted.  Polyp was resected.  Pathology should be followed up on by gastroenterology.  Continue with aggressive bowel regimen.   Aspiration pneumonia/hypoxia Required oxygen transiently.  Now saturating normally on room air.  Continue with Augmentin for few more days.  Patient seen by speech therapy as well.   Thrombocytopenia/concern for HIT Drop in platelet counts noted.  More than 50% drop.  She was noted to be on Lovenox.  This was discontinued.  Heparin antibody is normal.  Platelet counts have improved.  No further work-up at this time.   Transient delirium Patient noted to have symptoms suggestive of delirium.  Was given Haldol.  Mentation is back to normal now.  She never had any focal neurological deficits.    Hypoglycemia Likely due to n.p.o.  status.  Resolved when she was started back on diet.  Vertebral lesions, sclerotic Multiple lesions noted in the thoracic and lumbar vertebrae.  These are sclerotic as per CT report.  Noted previously in 2021 as well but they appear to have progressed.  It does not appear the patient has had any work-up for these.  She tells me that she is up-to-date on her mammograms.  Her last mammogram in our system was in 2021 which was unremarkable.  She mentions that she is due for another mammogram this year. Her calcium level noted to be normal along with normal albumin. CT scan of the chest does raise concern for lung masses.  See below. Bone scan also revealed sclerotic lesions from T11-L3 raising concern for osseous metastatic process.   History of  bronchiectasis/COPD/abnormal CT chest It appears that she was previously followed by Dr. Chase Caller.  Has not seen them in several years.   CT of the chest reveals significant bronchiectasis with concern for lung masses. Seen by pulmonology.  They have prescribed nebulizer treatments.  They recommend outpatient evaluation of the masses in the next week or so.  She will also need a PET scan.  They are working on getting her an appointment to see Dr. Chase Caller.   Has had a few COPD exacerbations in the past few months.  Has been on prednisone a few times.  Continues to smoke cigarettes. See above regarding aspiration pneumonia.  Tobacco abuse Counseled.  Nicotine patch  Alcohol abuse Drinks 3 times a week.  Did not experience any alcohol withdrawal during this admission.  Hyponatremia Probably due to hypovolemia with some component of SIADH considering her significant bronchiectases.  Urine osmolality was noted to be elevated.  Sodium level has improved.   Essential hypertension Continue amlodipine.   Bipolar disorder Followed by psychiatry.  Noted to be on lithium, nortriptyline and venlafaxine.  Lithium level was 1.2 on 5/25.   Due to the fact that she had tremors lithium level was rechecked and noted to be 1.14 on 5/28.   Patient is stable.  Okay for discharge home today.  Discussed with her husband as well.   PERTINENT LABS:  The results of significant diagnostics from this hospitalization (including imaging, microbiology, ancillary and laboratory) are listed below for reference.    Labs:   Basic Metabolic Panel: Recent Labs  Lab 04/23/22 1846 04/25/22 0621 04/27/22 0524 04/28/22 0451 04/29/22 0552  NA 127* 133* 131* 136 135  K 4.6 4.3 4.3 3.7 3.6  CL 95* 106 104 108 107  CO2 24 19* 23 25 25   GLUCOSE 86 81 66* 94 118*  BUN 9 7* 6* <5* 7*  CREATININE 0.57 0.49 0.43* <0.30* 0.44  CALCIUM 10.1 9.3 8.6* 8.7* 8.6*  MG  --  2.3 2.3  --   --    Liver Function  Tests: Recent Labs  Lab 04/23/22 1846 04/25/22 0621  AST 28 22  ALT 22 20  ALKPHOS 209* 173*  BILITOT 0.6 0.8  PROT 7.6 6.5  ALBUMIN 3.7 3.1*   Recent Labs  Lab 04/23/22 1846  LIPASE 29    CBC: Recent Labs  Lab 04/23/22 1846 04/25/22 0621 04/27/22 0524 04/28/22 0451 04/29/22 0552  WBC 10.7* 5.9 4.5 3.6* 4.0  NEUTROABS 9.0*  --   --   --   --   HGB 14.0 13.3 12.1 12.3 11.8*  HCT 44.5 41.5 38.2 38.3 37.1  MCV 94.1 96.3 97.4 96.2 96.4  PLT 187 141* 97* 103*  103*     CBG: Recent Labs  Lab 04/28/22 0111 04/28/22 0739 04/28/22 1553 04/28/22 2344 04/29/22 0802  GLUCAP 102* 92 223* 120* 104*     IMAGING STUDIES DG Abdomen 1 View  Result Date: 04/23/2022 CLINICAL DATA:  Constipation. EXAM: ABDOMEN - 1 VIEW COMPARISON:  None Available. FINDINGS: A moderate amount of stool is present in the rectum and distal colon. There is mild gaseous distension of the transverse colon. No small bowel dilatation is seen to suggest obstruction. No acute osseous abnormality is evident. IMPRESSION: Moderate amount of stool in the distal colon and rectum. No evidence of bowel obstruction. Electronically Signed   By: Logan Bores M.D.   On: 04/23/2022 18:58   CT CHEST W CONTRAST  Result Date: 04/24/2022 CLINICAL DATA:  Chronic dyspnea, unclear etiology, sclerotic lower thoracic and lumbar spine lesions, primary malignancy search EXAM: CT CHEST WITH CONTRAST TECHNIQUE: Multidetector CT imaging of the chest was performed during intravenous contrast administration. RADIATION DOSE REDUCTION: This exam was performed according to the departmental dose-optimization program which includes automated exposure control, adjustment of the mA and/or kV according to patient size and/or use of iterative reconstruction technique. CONTRAST:  83mL OMNIPAQUE IOHEXOL 300 MG/ML  SOLN COMPARISON:  CT abdomen pelvis, 04/23/2022, CT chest, 11/08/2020 FINDINGS: Cardiovascular: Aortic atherosclerosis. Normal heart size.  Coronary artery calcifications. No pericardial effusion. Mediastinum/Nodes: Interval enlargement of mediastinal and bilateral hilar lymph nodes, largest pretracheal nodes measuring 1.8 x 1.3 cm, previously 1.1 x 0.9 cm (series 2, image 58). Thyroid gland, trachea, and esophagus demonstrate no significant findings. Lungs/Pleura: Severe centrilobular and paraseptal emphysema. Severe, varicoid and cystic bronchiectasis in the right greater than left bilateral lung bases (series 5, image 107). Slightly worsened associated nodular airspace disease, particularly in the left lung base (series 6, image 112). Significant interval enlargement of a subpleural mass of the posterior right upper lobe, measuring 3.3 x 2.2 cm, previously 1.3 x 1.1 cm (series 6, image 68). There is additional, new masslike consolidation laterally, measuring 4.5 x 1.9 cm (series 5, image 62). Multiple additional foci of less solid-appearing subpleural ground-glass and consolidative airspace disease within the medial right upper lobe (series 6, image 64) and subpleural left upper lobe (series 5, image 49). No pleural effusion or pneumothorax. Upper Abdomen: No acute abnormality. Musculoskeletal: No chest wall abnormality. Densely sclerotic osseous lesions of the T11 and lower included vertebral bodies, better included by prior CT of the abdomen and pelvis. IMPRESSION: 1. Significant interval enlargement of a subpleural masslike consolidation of the posterior right upper lobe in comparison to prior CT of the chest dated 11/08/2020, highly suspicious for primary lung malignancy. 2. There is additional, new masslike consolidation laterally in the right upper lobe, as well as multiple additional foci of less solid-appearing subpleural ground-glass and consolidative airspace disease within the medial right upper lobe and subpleural left upper lobe. Although multifocal or metastatic malignancy is a general differential consideration, appearance of these  additional lesions is more suggestive of airspace disease and chronic sequelae. PET-CT may be helpful for metabolic characterization of all of the above lesions. 3. Interval enlargement of mediastinal and bilateral hilar lymph nodes, possibly reactive although suspicious for nodal metastatic disease. As above, PET-CT may be helpful for metabolic characterization. 4. Severe, varicoid and cystic bronchiectasis in the right greater than left bilateral lung bases, as seen by prior examination. Slightly worsened associated nodular airspace disease, particularly in the left lung base. Findings are consistent with chronic sequelae of aspiration, likely ongoing. 5. Severe  emphysema. 6. Coronary artery disease. 7. Densely sclerotic osseous lesions of the T11 and lower included vertebral bodies, better included by prior CT of the abdomen and pelvis. These remain highly suspicious for osseous metastases, however general differential considerations would include chronic osteomyelitis given the apparently isolated, focal distribution as well as no previous history of treatment to induce post treatment sclerosis. No additional osseous lesions identified in the chest above the T11 vertebral body. Aortic Atherosclerosis (ICD10-I70.0) and Emphysema (ICD10-J43.9). Electronically Signed   By: Delanna Ahmadi M.D.   On: 04/24/2022 13:36   NM Bone Scan Whole Body  Result Date: 04/24/2022 CLINICAL DATA:  Abnormal CT scan, lower back pain, weight loss, elevated alkaline phosphatase, question osseous metastatic disease EXAM: NUCLEAR MEDICINE WHOLE BODY BONE SCAN TECHNIQUE: Whole body anterior and posterior images were obtained approximately 3 hours after intravenous injection of radiopharmaceutical. RADIOPHARMACEUTICALS:  22 mCi Technetium-65m MDP IV COMPARISON:  CT chest 04/24/2022, CT abdomen and pelvis 04/23/2022 FINDINGS: Abnormal increased tracer uptake identified within thoracolumbar spine at approximately T11, T12, L1, and L2  highly suspicious for metastatic disease. These areas correspond to foci of dense sclerosis on CT. Additional small focus of increased tracer uptake within LEFT L3. No additional worrisome sites of tracer accumulation. Distended urinary bladder with retained tracer,. Remaining soft tissue distribution of tracer unremarkable. IMPRESSION: Abnormal increased uptake within thoracolumbar spine at T11-L3 corresponding to sclerotic foci on recent CT most consistent with osseous metastatic disease. Distended urinary bladder, patient unable to empty. Electronically Signed   By: Lavonia Dana M.D.   On: 04/24/2022 14:48   CT ABDOMEN PELVIS W CONTRAST  Result Date: 04/23/2022 CLINICAL DATA:  Constipation, bowel obstruction EXAM: CT ABDOMEN AND PELVIS WITH CONTRAST TECHNIQUE: Multidetector CT imaging of the abdomen and pelvis was performed using the standard protocol following bolus administration of intravenous contrast. RADIATION DOSE REDUCTION: This exam was performed according to the departmental dose-optimization program which includes automated exposure control, adjustment of the mA and/or kV according to patient size and/or use of iterative reconstruction technique. CONTRAST:  54mL OMNIPAQUE IOHEXOL 300 MG/ML  SOLN COMPARISON:  CT chest 11/08/2020 FINDINGS: Lower chest: There is severe varicoid bronchiectasis, asymmetrically more severe within the right lung base, similar to that noted on prior examination. This may reflect the sequela of recurrent aspiration, mucociliary disorders, or remote and/or recurrent infection. There is interval development of numerous air-fluid levels within the bronchiectatic airways of the visualized right lung base and peribronchial nodularity within the left lower lobe which may reflect changes of superimposed acute infection or aspiration. Spiculated opacities within the peripheral right lung base appears stable since prior examination and are most in keeping with post inflammatory  scarring. Cardiac size within normal limits. Hepatobiliary: No focal liver abnormality is seen. No gallstones, gallbladder wall thickening, or biliary dilatation. Pancreas: Unremarkable Spleen: Unremarkable Adrenals/Urinary Tract: Adrenal glands are unremarkable. Kidneys are normal, without renal calculi, focal lesion, or hydronephrosis. Bladder is unremarkable. Stomach/Bowel: Large volume stool seen within the rectal vault. Mild perirectal edema may reflect changes of superimposed stercoral proctitis. Moderate stool seen throughout the remainder of the colon. Stomach, small bowel, and large bowel are otherwise unremarkable. Appendix normal. No free intraperitoneal gas or fluid. Vascular/Lymphatic: Extensive aortoiliac atherosclerotic calcification. No aortic aneurysm. No pathologic adenopathy within the abdomen and pelvis Reproductive: Uterus and bilateral adnexa are unremarkable. Other: No abdominal wall hernia Musculoskeletal: Sclerotic lesions are seen within the T11, T12, L1, and L2 vertebral bodies concerning for metastatic disease. Lesion within the T11 vertebral body  appears new since prior examination and T12 vertebral body has progressed. IMPRESSION: Severe asymmetric varicoid bronchiectasis within the visualized lung bases. See discussion above. Interval development of peribronchial nodularity within the left lung base and numerous air-fluid levels within the bronchiectatic airways of the right lung base in keeping with superimposed infection or aspiration. Stable spiculated opacities within the right lung base most in keeping with post inflammatory change. Large volume stool within the rectal vault with perirectal edema suggesting changes of stercoral proctitis. Superimposed moderate stool burden throughout the colon. Progressive sclerotic lesions within the visualized thoracolumbar spine suspicious for progressive osseous metastatic disease. Correlation for history of primary malignancy is recommended.  Aortic Atherosclerosis (ICD10-I70.0). Electronically Signed   By: Fidela Salisbury M.D.   On: 04/23/2022 20:19   DG CHEST PORT 1 VIEW  Result Date: 04/27/2022 CLINICAL DATA:  Cough.  History of COPD and hypertension. EXAM: PORTABLE CHEST 1 VIEW COMPARISON:  11/08/2020 FINDINGS: Normal heart size. Again noted is chronic bronchiectasis within the right lower lobe. New multifocal bilateral airspace opacities within both upper lobes. Suspect small right pleural effusion. Mild interstitial edema noted. IMPRESSION: 1. New bilateral upper lobe airspace opacities compatible with multifocal pneumonia. Followup PA and lateral chest X-ray is recommended in 3-4 weeks following trial of antibiotic therapy to ensure resolution and exclude underlying malignancy. 2. Mild interstitial edema and small right pleural effusion. 3. Chronic right lower lobe bronchiectasis. Electronically Signed   By: Kerby Moors M.D.   On: 04/27/2022 10:16    DISCHARGE EXAMINATION: Vitals:   04/28/22 2023 04/29/22 0434 04/29/22 0814 04/29/22 0958  BP: (!) 113/47 129/66  132/69  Pulse: 80 72  77  Resp: 18 17    Temp: 97.8 F (36.6 C) 97.8 F (36.6 C)    TempSrc:      SpO2: 99% 94% 97%   Weight:      Height:       General appearance: Awake alert.  In no distress Resp: Improved aeration bilaterally.  Fewer crackles at the bases. Cardio: S1-S2 is normal regular.  No S3-S4.  No rubs murmurs or bruit GI: Abdomen is soft.  Nontender nondistended.  Bowel sounds are present normal.  No masses organomegaly    DISPOSITION: Home  Discharge Instructions     Ambulatory referral to Pulmonology   Complete by: As directed    Reason for referral: Lung Mass/Lung Nodule   Call MD for:  difficulty breathing, headache or visual disturbances   Complete by: As directed    Call MD for:  extreme fatigue   Complete by: As directed    Call MD for:  persistant dizziness or light-headedness   Complete by: As directed    Call MD for:  persistant  nausea and vomiting   Complete by: As directed    Call MD for:  severe uncontrolled pain   Complete by: As directed    Call MD for:  temperature >100.4   Complete by: As directed    Diet - low sodium heart healthy   Complete by: As directed    Discharge instructions   Complete by: As directed    Please take your medications as prescribed.  Take the laxatives and stool softeners on a daily basis as instructed to avoid constipation.  Follow-up with your primary care provider.  A referral has been sent to your lung doctor, Dr. Chase Caller, to arrange follow-up for the lesions noted in your lungs.  You will need further work-up for the same.  You were  cared for by a hospitalist during your hospital stay. If you have any questions about your discharge medications or the care you received while you were in the hospital after you are discharged, you can call the unit and asked to speak with the hospitalist on call if the hospitalist that took care of you is not available. Once you are discharged, your primary care physician will handle any further medical issues. Please note that NO REFILLS for any discharge medications will be authorized once you are discharged, as it is imperative that you return to your primary care physician (or establish a relationship with a primary care physician if you do not have one) for your aftercare needs so that they can reassess your need for medications and monitor your lab values. If you do not have a primary care physician, you can call [phone] for a physician referral.   Increase activity slowly   Complete by: As directed         Allergies as of 04/29/2022       Reactions   Sulfa Drugs Cross Reactors Hives, Rash   Ceclor [cefaclor] Hives   Tolerated ceftazidime December 2016   Depakote Er [divalproex Sodium Er] Other (See Comments)   Patient remarked that when she was taking this TWICE a day, she became "clumsy" and felt more prone to stumbling   Escitalopram  Oxalate Other (See Comments)   Pt does not recall ever taking medication (??)   Latex Itching   Other Other (See Comments)   Hydrocort Cream  Unknown   Sertraline Hcl Other (See Comments)   Severe headaches   Tape Itching, Rash   Band-Aids, also (PAPER TAPE IS TOLERATED)        Medication List     STOP taking these medications    guaiFENesin-dextromethorphan 100-10 MG/5ML syrup Commonly known as: ROBITUSSIN DM   HYDROcodone-Acetaminophen 5-300 MG Tabs   hydrOXYzine 25 MG capsule Commonly known as: Vistaril   predniSONE 10 MG tablet Commonly known as: DELTASONE   triamcinolone cream 0.1 % Commonly known as: KENALOG       TAKE these medications    albuterol 108 (90 Base) MCG/ACT inhaler Commonly known as: VENTOLIN HFA Inhale 2 puffs into the lungs every 6 (six) hours as needed for shortness of breath. What changed: See the new instructions.   alendronate 70 MG tablet Commonly known as: FOSAMAX Take 1 tablet (70 mg total) by mouth every 7 (seven) days. Take with a full glass of water on an empty stomach.   amLODipine 2.5 MG tablet Commonly known as: NORVASC Take 1 tablet (2.5 mg total) by mouth daily.   amoxicillin-clavulanate 875-125 MG tablet Commonly known as: AUGMENTIN Take 1 tablet by mouth every 12 (twelve) hours for 5 days.   cetirizine 10 MG tablet Commonly known as: ZYRTEC Take 1 tablet (10 mg total) by mouth daily.   fluticasone 50 MCG/ACT nasal spray Commonly known as: FLONASE SPRAY 2 SPRAYS INTO EACH NOSTRIL EVERY DAY What changed: See the new instructions.   folic acid 1 MG tablet Commonly known as: FOLVITE Take 1 tablet (1 mg total) by mouth daily.   lithium carbonate 300 MG CR tablet Commonly known as: LITHOBID Take 1 tablet (300 mg total) by mouth 3 (three) times daily. What changed:  how much to take when to take this additional instructions   nortriptyline 50 MG capsule Commonly known as: PAMELOR Take 1 capsule (50 mg total)  by mouth at bedtime.   polyethylene glycol 17  g packet Commonly known as: MIRALAX / GLYCOLAX Take 17 g by mouth daily.   senna 8.6 MG Tabs tablet Commonly known as: SENOKOT Take 2 tablets (17.2 mg total) by mouth 2 (two) times daily.   spironolactone 50 MG tablet Commonly known as: ALDACTONE TAKE 1 TABLET BY MOUTH EVERY DAY   Stiolto Respimat 2.5-2.5 MCG/ACT Aers Generic drug: Tiotropium Bromide-Olodaterol Inhale 2 puffs into the lungs daily.   thiamine 100 MG tablet Take 1 tablet (100 mg total) by mouth daily.   venlafaxine 75 MG tablet Commonly known as: EFFEXOR Take 1 tablet (75 mg total) by mouth daily.   Vitamin D (Ergocalciferol) 1.25 MG (50000 UNIT) Caps capsule Commonly known as: DRISDOL TAKE 1 CAPSULE (50,000 UNITS TOTAL) BY MOUTH EVERY 7 (SEVEN) DAYS          Follow-up Information     Brand Males, MD Follow up.   Specialty: Pulmonary Disease Why: The office will call to schedule appointment. Contact information: [address] [place] [address] [phone]         Maximiano Coss, NP. Schedule an appointment as soon as possible for a visit in 1 week(s).   Specialty: Adult Health Nurse Practitioner Contact information: [place] [address] [phone]                 TOTAL DISCHARGE TIME: 5 minutes  [place]  [facility] Pager on [email or web address]  04/29/2022, 11:19 AM

## 2022-04-30 ENCOUNTER — Other Ambulatory Visit (HOSPITAL_COMMUNITY): Payer: Self-pay | Admitting: *Deleted

## 2022-04-30 DIAGNOSIS — F419 Anxiety disorder, unspecified: Secondary | ICD-10-CM

## 2022-04-30 LAB — SURGICAL PATHOLOGY

## 2022-04-30 MED ORDER — VENLAFAXINE HCL 75 MG PO TABS: 75.0000 mg | ORAL_TABLET | Freq: Every day | ORAL | 0 refills | Status: DC

## 2022-05-02 ENCOUNTER — Ambulatory Visit: Payer: Medicare PPO | Admitting: Physician Assistant

## 2022-05-07 ENCOUNTER — Other Ambulatory Visit (HOSPITAL_COMMUNITY): Payer: Self-pay | Admitting: Psychiatry

## 2022-05-07 DIAGNOSIS — F419 Anxiety disorder, unspecified: Secondary | ICD-10-CM

## 2022-05-08 NOTE — Telephone Encounter (Signed)
Order placed for PET scan per Dr Erin Fulling. In order placed that patient needs to have follow up after scan with Dr Erin Fulling to go over results. Nothing further needed

## 2022-05-08 NOTE — Telephone Encounter (Signed)
Ok to place order for PET scan then. She will need follow up visit in clinic after pet scan.  Thanks, JD

## 2022-05-08 NOTE — Telephone Encounter (Signed)
Catherine Kelley she has 2 issues  1) Lung nodule and Mediastinal nodes -> this needa  PET scan but what she has had is a bone scan.  If you look at the note she has been refusing PET scan.  It does not matter if she has or does not have the PET scan.  She should see Icard/[facility] for this.  Ideally after the PET scan but if you cannot get the PET scan done because she does not want other scheduling issues at least get her to see one of the 2 doctors  2) she also has bronchiectasis -> for this the ideal person is Dr. Rodman Key Hunsucker  3) I do not need to see her

## 2022-05-09 ENCOUNTER — Encounter (HOSPITAL_COMMUNITY): Payer: Self-pay | Admitting: Psychiatry

## 2022-05-09 ENCOUNTER — Telehealth (HOSPITAL_BASED_OUTPATIENT_CLINIC_OR_DEPARTMENT_OTHER): Payer: Medicare PPO | Admitting: Psychiatry

## 2022-05-09 ENCOUNTER — Telehealth: Payer: Self-pay | Admitting: Pulmonary Disease

## 2022-05-09 DIAGNOSIS — F419 Anxiety disorder, unspecified: Secondary | ICD-10-CM

## 2022-05-09 DIAGNOSIS — F319 Bipolar disorder, unspecified: Secondary | ICD-10-CM | POA: Diagnosis not present

## 2022-05-09 MED ORDER — VENLAFAXINE HCL 75 MG PO TABS: 75.0000 mg | ORAL_TABLET | Freq: Every day | ORAL | 0 refills | Status: AC

## 2022-05-09 MED ORDER — NORTRIPTYLINE HCL 50 MG PO CAPS: 50.0000 mg | ORAL_CAPSULE | Freq: Every day | ORAL | 0 refills | Status: AC

## 2022-05-09 MED ORDER — LITHIUM CARBONATE ER 300 MG PO TBCR: EXTENDED_RELEASE_TABLET | ORAL | 0 refills | Status: AC

## 2022-05-09 NOTE — Telephone Encounter (Signed)
I have scheduled PET and gave appt info to pt & husband.  Scheduled hosp follow up appt with JD.  Mailed pt appt reminders as requested.  Nothing further needed.

## 2022-05-09 NOTE — Progress Notes (Signed)
Virtual Visit via Telephone Note  I connected with Catherine Kelley on 05/09/22 at 11:20 AM EDT by telephone and verified that I am speaking with the correct person using two identifiers.  Location: Patient: Home Provider: Home Office   I discussed the limitations, risks, security and privacy concerns of performing an evaluation and management service by telephone and the availability of in person appointments. I also discussed with the patient that there may be a patient responsible charge related to this service. The patient expressed understanding and agreed to proceed.   History of Present Illness: Patient is evaluated by phone session.  She was recently admitted to the hospital initially for constipation and later on has more issues.  She was found to have hyponatremia and spots on her lung.  She is feeling better.  Her sodium is 118 and her last lithium level was 1.12.  Physically she is doing better and she denies any panic attack, crying spells, hopelessness.  She lives with her husband who is very supportive.  Patient told no medicines were changed and she wants to keep the current medicine which are Effexor, lithium and nortriptyline.  She is trying to be more active and start walking every day.  She does not drive but her husband helps her if she need to go anywhere.  She denies any hallucination, paranoia and denies any suicidal thoughts.  She is anxious but denies any major panic attack.  She is trying to improve her appetite but also had an upcoming appointment to address her low sodium with the doctor.  She has not taken any hydroxyzine and does not need any more refills.  Past Psychiatric History:  H/O depression, anxiety and bipolar d/o. Had tried Paxil, Prozac and Zoloft that cause headaches. Abilify caused stiffnes.  Took Xanax for many years however it was not renewed by primary care physician when Dr. Toy Care office did not accept her insurance.  Did not recall any history of suicidal  attempt or psychiatric inpatient treatment.   Psychiatric Specialty Exam: Physical Exam  Review of Systems  Weight 101 lb (45.8 kg).There is no height or weight on file to calculate BMI.  General Appearance: NA  Eye Contact:  NA  Speech:  NA  Volume:  Decreased  Mood:  Anxious  Affect:  NA  Thought Process:  Goal Directed  Orientation:  Full (Time, Place, and Person)  Thought Content:  Logical  Suicidal Thoughts:  No  Homicidal Thoughts:  No  Memory:  Immediate;   Fair Recent;   Fair Remote;   Fair  Judgement:  Intact  Insight:  Present  Psychomotor Activity:  NA  Concentration:  Concentration: Fair and Attention Span: Fair  Recall:  [employer] of Knowledge:  Good  Language:  Good  Akathisia:  No  Handed:  Right  AIMS (if indicated):     Assets:  Communication Skills Desire for Improvement Housing Resilience Social Support  ADL's:  Intact  Cognition:  WNL  Sleep:   fair      Assessment and Plan: Bipolar disorder type I.  Anxiety.  I reviewed blood work results.  Her last lithium level was 1.12.  Her sodium is 118.  I encouraged to keep appointment with her PCP to address the low sodium.  Patient does not want to change the medication since he feels her mania and anxiety is stable.  Continue nortriptyline 50 mg at bedtime, Effexor 75 mg daily and lithium 600 mg in the morning and 300  mg at bedtime.  Recommended to call us back if is any question or any concern.  Follow-up in 3 months.  Follow Up Instructions:    I discussed the assessment and treatment plan with the patient. The patient was provided an opportunity to ask questions and all were answered. The patient agreed with the plan and demonstrated an understanding of the instructions.   The patient was advised to call back or seek an in-person evaluation if the symptoms worsen or if the condition fails to improve as anticipated.  Collaboration of Care: Primary Care Provider AEB notes are available in epic to  review.  Patient/Guardian was advised Release of Information must be obtained prior to any record release in order to collaborate their care with an outside provider. Patient/Guardian was advised if they have not already done so to contact the registration department to sign all necessary forms in order for [name] to release information regarding their care.   Consent: Patient/Guardian gives verbal consent for treatment and assignment of benefits for services provided during this visit. Patient/Guardian expressed understanding and agreed to proceed.    I provided 15 minutes of non-face-to-face time during this encounter.   Kathlee Nations, MD

## 2022-05-16 ENCOUNTER — Ambulatory Visit (HOSPITAL_COMMUNITY)
Admission: RE | Admit: 2022-05-16 | Discharge: 2022-05-16 | Disposition: A | Discharge status: Home / Self Care | Payer: Medicare PPO | Source: Ambulatory Visit | Attending: Pulmonary Disease | Admitting: Pulmonary Disease

## 2022-05-16 DIAGNOSIS — J479 Bronchiectasis, uncomplicated: Secondary | ICD-10-CM | POA: Diagnosis not present

## 2022-05-16 DIAGNOSIS — R918 Other nonspecific abnormal finding of lung field: Secondary | ICD-10-CM | POA: Diagnosis not present

## 2022-05-16 DIAGNOSIS — J432 Centrilobular emphysema: Secondary | ICD-10-CM | POA: Diagnosis not present

## 2022-05-16 DIAGNOSIS — I2510 Atherosclerotic heart disease of native coronary artery without angina pectoris: Secondary | ICD-10-CM | POA: Diagnosis not present

## 2022-05-16 DIAGNOSIS — C7951 Secondary malignant neoplasm of bone: Secondary | ICD-10-CM | POA: Diagnosis not present

## 2022-05-16 LAB — GLUCOSE, CAPILLARY: Glucose-Capillary: 116 mg/dL — ABNORMAL HIGH (ref 70–99)

## 2022-05-16 MED ORDER — FLUDEOXYGLUCOSE F - 18 (FDG) INJECTION: 5.0000 | Freq: Once | INTRAVENOUS | Status: DC | PRN

## 2022-05-18 ENCOUNTER — Other Ambulatory Visit: Payer: Self-pay | Admitting: Registered Nurse

## 2022-05-23 ENCOUNTER — Encounter: Payer: Self-pay | Admitting: Pulmonary Disease

## 2022-05-23 ENCOUNTER — Ambulatory Visit (INDEPENDENT_AMBULATORY_CARE_PROVIDER_SITE_OTHER): Payer: Medicare PPO | Admitting: Pulmonary Disease

## 2022-05-23 ENCOUNTER — Other Ambulatory Visit: Payer: Self-pay

## 2022-05-23 ENCOUNTER — Telehealth: Payer: Self-pay | Admitting: Emergency Medicine

## 2022-05-23 VITALS — BP 128/72 | HR 78 | Ht 63.0 in

## 2022-05-23 DIAGNOSIS — C7951 Secondary malignant neoplasm of bone: Secondary | ICD-10-CM

## 2022-05-23 DIAGNOSIS — J479 Bronchiectasis, uncomplicated: Secondary | ICD-10-CM | POA: Diagnosis not present

## 2022-05-23 DIAGNOSIS — R918 Other nonspecific abnormal finding of lung field: Secondary | ICD-10-CM

## 2022-05-23 DIAGNOSIS — R590 Localized enlarged lymph nodes: Secondary | ICD-10-CM

## 2022-05-23 LAB — CBC WITH DIFFERENTIAL/PLATELET
Basophils Absolute: 0 10*3/uL (ref 0.0–0.1)
Basophils Relative: 0.3 % (ref 0.0–3.0)
Eosinophils Absolute: 0.1 10*3/uL (ref 0.0–0.7)
Eosinophils Relative: 1.9 % (ref 0.0–5.0)
HCT: 38.9 % (ref 36.0–46.0)
Hemoglobin: 12.8 g/dL (ref 12.0–15.0)
Lymphocytes Relative: 9.4 % — ABNORMAL LOW (ref 12.0–46.0)
Lymphs Abs: 0.7 10*3/uL (ref 0.7–4.0)
MCHC: 32.9 g/dL (ref 30.0–36.0)
MCV: 89.5 fl (ref 78.0–100.0)
Monocytes Absolute: 0.3 10*3/uL (ref 0.1–1.0)
Monocytes Relative: 4.7 % (ref 3.0–12.0)
Neutro Abs: 6 10*3/uL (ref 1.4–7.7)
Neutrophils Relative %: 83.7 % — ABNORMAL HIGH (ref 43.0–77.0)
Platelets: 132 10*3/uL — ABNORMAL LOW (ref 150.0–400.0)
RBC: 4.34 Mil/uL (ref 3.87–5.11)
RDW: 13.5 % (ref 11.5–15.5)
WBC: 7.2 10*3/uL (ref 4.0–10.5)

## 2022-05-23 MED ORDER — ALBUTEROL SULFATE HFA 108 (90 BASE) MCG/ACT IN AERS: 2.0000 | INHALATION_SPRAY | Freq: Four times a day (QID) | RESPIRATORY_TRACT | 2 refills | Status: AC | PRN

## 2022-05-23 MED ORDER — REVEFENACIN 175 MCG/3ML IN SOLN: 175.0000 ug | Freq: Every day | RESPIRATORY_TRACT | 3 refills | Status: DC

## 2022-05-23 MED ORDER — OXYCODONE-ACETAMINOPHEN 5-325 MG PO TABS: 1.0000 | ORAL_TABLET | Freq: Three times a day (TID) | ORAL | 0 refills | Status: DC | PRN

## 2022-05-23 NOTE — H&P (View-Only) (Signed)
Synopsis: Referred in June 2023 for lung mass  Subjective:   PATIENT ID: Catherine Kelley GENDER: female DOB: August 07, 1951, MRN: [id number]   HPI  Chief Complaint  Patient presents with   Hospitalization Follow-up    HFU after PET scan. States she is still weak from her hospital stay. Increased SOB, especially with exertion.    Catherine Kelley is a 71 year old woman, daily smoker with bronchiectasis and COPD who is referred back to pulmonary clinic for lung mass.  She was recently hospitalized 5/25 to 5/30 for severe constipation. She underwent colonoscopy with removal of one 16mm polyp in the rectum. She was discharged home and has been doing ok. She has limited mobility at this time. She complains of back pain.   CT PET scan 6/16 shows hypermetabolic mediastinal and hilar lymph nodes bilaterally. RUL has two hypermetabolic masses measuring 4.4 x 1.8cm and 3.8 x 2.3 cm. She also has sclerotic lesions of T11, T12, L1 and L2 vertebral bodies with hypermetabolic uptake.   She has been followed by Dr. Chase Caller in the past. Inflammatory labs have been negative in regards to etiologies of her bronchiectasis. Her bronchiectasis is felt secondary to her smoking and chronic aspiration due to dysphagia as she has been recommended to have dysphagia II diet per Catherine Edison, NP note in 2017. Respiratory cultures have grown pseudomonas in the past. She has had slow weight loss over recent years.   She has a 50+ pack year history. She lives with her husband who has accompanied her on todays visit.    Past Medical History:  Diagnosis Date   Anxiety    Arthritis    "jooints" (09/18/2017)   Asthma    Bipolar disorder ([place])    Bipolar I disorder ([place]) 10/11/2019   CAP (community acquired pneumonia) 09/18/2017   Cat allergies    Chronic bronchitis ([place])    "get it alot; maybe not q yr" (07/06/2014)   Chronic lower back pain    "spurs and pinched nerves" (09/18/2017)   COPD (chronic obstructive pulmonary  disease) ([place])    DDD (degenerative disc disease), lumbar    Depression    psychiatrist Dr. Toy Care   Diastolic dysfunction    Emphysematous COPD ([place])    changes in right base along with patchy areas on last x-ray   Fall as cause of accidental injury at home as place of occurrence 10/11/2019   Fall involving wheelchair as cause of accidental injury 10/11/2019   Falls frequently    "several in 2017; fell at dr's office yesterday" (09/18/2017)   GERD (gastroesophageal reflux disease)    HCAP (healthcare-associated pneumonia) 02/12/2016   Heart murmur    History of cardiovascular stress test 08/15/2004   EF of 70% -- Normal stress cardiolite.  There is no evidence of ischemia and there is normal LV function. -- [name]. MD   History of echocardiogram 12/24/2006   Est. EF of 82-95% NORMAL LV SYSTOLIC FUNCTION WITH IMPAIRED RELAXATION -- MILD AORTIC SCLEROSIS -- NORMAL PALONARY ARTERY PRESSURE -- NO OLD ECHOS FOR COMPARISON -- [name], MD   Hypertension    essential hypertension   Migraine    "when I was having my periods; none since then" (09/18/2017)   Necrotic pneumonia ([place])    recurrent/notes 02/12/2016   Pneumonia    "several times since May 2015" (07/06/2014)   Pollen allergies    Tobacco abuse    smoking about 1/2 pk of cigarettes a day   Tobacco  dependence due to cigarettes 10/11/2019   Traumatic hematoma of knee, right, initial encounter 10/11/2019     Family History  Problem Relation Age of Onset   Stroke Mother    Heart disease Father    Hyperlipidemia Father    Hypertension Father    Breast cancer Maternal Aunt    Asthma Maternal Grandmother      Social History   Socioeconomic History   Marital status: Married    Spouse name: richard   Number of children: 2   Years of education: Not on file   Highest education level: Associate degree: occupational, Hotel manager, or vocational program  Occupational History   Occupation: Optician, dispensing: [employer]    Comment: [employer]  Tobacco Use   Smoking status: Every Day    Packs/day: 0.50    Years: 46.00    Total pack years: 23.00    Types: Cigarettes   Smokeless tobacco: Never  Vaping Use   Vaping Use: Never used  Substance and Sexual Activity   Alcohol use: Yes    Alcohol/week: 0.0 standard drinks of alcohol    Comment: 09/18/2017 "might have 1 drink/month; if that"   Drug use: No   Sexual activity: Not Currently  Other Topics Concern   Not on file  Social History Narrative   Patient currently lives with her husband.    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 No bird or hot tub exposure. No mold exposure. No recent travel.   Social Determinants of Health   Financial Resource Strain: Low Risk  (05/06/2021)   Overall Financial Resource Strain (CARDIA)    Difficulty of Paying Living Expenses: Not hard at all  Food Insecurity: No Food Insecurity (05/06/2021)   Hunger Vital Sign    Worried About Running Out of Food in the Last Year: Never true    Ran Out of Food in the Last Year: Never true  Transportation Needs: No Transportation Needs (10/16/2018)   PRAPARE - Hydrologist (Medical): No    Lack of Transportation (Non-Medical): No  Physical Activity: Inactive (05/06/2021)   Exercise Vital Sign    Days of Exercise per Week: 0 days    Minutes of Exercise per Session: 0 min  Stress: No Stress Concern Present (05/06/2021)   [place]    Feeling of Stress : Not at all  Social Connections: Moderately Isolated (05/06/2021)   Social Connection and Isolation Panel [NHANES]    Frequency of Communication with Friends and Family: Three times a week    Frequency of Social Gatherings with Friends and Family: Once a week    Attends Religious Services: Never    Marine scientist or Organizations:  No    Attends Archivist Meetings: Never    Marital Status: Married  Human resources officer Violence: Not At Risk (05/06/2021)   Humiliation, Afraid, Rape, and Kick questionnaire    Fear of Current or Ex-Partner: No    Emotionally Abused: No    Physically Abused: No    Sexually Abused: No     Allergies  Allergen Reactions   Sulfa Drugs Cross Reactors Hives and Rash   Ceclor [Cefaclor] Hives    Tolerated ceftazidime December 2016   Depakote Er [Divalproex Sodium Er] Other (See Comments)    Patient  remarked that when she was taking this TWICE a day, she became "clumsy" and felt more prone to stumbling   Escitalopram Oxalate Other (See Comments)    Pt does not recall ever taking medication (??)   Latex Itching   Other Other (See Comments)    Hydrocort Cream  Unknown   Sertraline Hcl Other (See Comments)    Severe headaches    Tape Itching and Rash    Band-Aids, also (PAPER TAPE IS TOLERATED)     Outpatient Medications Prior to Visit  Medication Sig Dispense Refill   alendronate (FOSAMAX) 70 MG tablet Take 1 tablet (70 mg total) by mouth every 7 (seven) days. Take with a full glass of water on an empty stomach. 4 tablet 11   amLODipine (NORVASC) 2.5 MG tablet Take 1 tablet (2.5 mg total) by mouth daily. 30 tablet 1   cetirizine (ZYRTEC) 10 MG tablet Take 1 tablet (10 mg total) by mouth daily. 30 tablet 11   fluticasone (FLONASE) 50 MCG/ACT nasal spray SPRAY 2 SPRAYS INTO EACH NOSTRIL EVERY DAY (Patient taking differently: Place 1 spray into both nostrils daily as needed for allergies.) 48 mL 2   folic acid (FOLVITE) 1 MG tablet Take 1 tablet (1 mg total) by mouth daily. 30 tablet 2   lithium carbonate (LITHOBID) 300 MG CR tablet Take 600 mg in am and 300 at bed time. 270 tablet 0   nortriptyline (PAMELOR) 50 MG capsule Take 1 capsule (50 mg total) by mouth at bedtime. 90 capsule 0   polyethylene glycol (MIRALAX / GLYCOLAX) 17 g packet Take 17 g by mouth daily. 30 each 2   senna  (SENOKOT) 8.6 MG TABS tablet Take 2 tablets (17.2 mg total) by mouth 2 (two) times daily. 120 tablet 0   spironolactone (ALDACTONE) 50 MG tablet TAKE 1 TABLET BY MOUTH EVERY DAY 90 tablet 1   thiamine 100 MG tablet Take 1 tablet (100 mg total) by mouth daily. 30 tablet 2   Tiotropium Bromide-Olodaterol (STIOLTO RESPIMAT) 2.5-2.5 MCG/ACT AERS Inhale 2 puffs into the lungs daily. 1 each 2   venlafaxine (EFFEXOR) 75 MG tablet Take 1 tablet (75 mg total) by mouth daily. 90 tablet 0   Vitamin D, Ergocalciferol, (DRISDOL) 1.25 MG (50000 UNIT) CAPS capsule TAKE 1 CAPSULE (50,000 UNITS TOTAL) BY MOUTH EVERY 7 (SEVEN) DAYS 12 capsule 0   albuterol (VENTOLIN HFA) 108 (90 Base) MCG/ACT inhaler Inhale 2 puffs into the lungs every 6 (six) hours as needed for shortness of breath. 1 each 2   No facility-administered medications prior to visit.    Review of Systems  Constitutional:  Positive for malaise/fatigue and weight loss. Negative for chills and fever.  HENT:  Negative for congestion, sinus pain and sore throat.   Eyes: Negative.   Respiratory:  Positive for cough, sputum production and shortness of breath. Negative for hemoptysis and wheezing.   Cardiovascular:  Negative for chest pain, palpitations, orthopnea, claudication and leg swelling.  Gastrointestinal:  Positive for constipation. Negative for abdominal pain, heartburn, nausea and vomiting.  Genitourinary: Negative.   Musculoskeletal:  Positive for back pain. Negative for joint pain and myalgias.  Skin:  Negative for rash.  Neurological:  Negative for weakness.  Endo/Heme/Allergies: Negative.   Psychiatric/Behavioral: Negative.      Objective:   Vitals:   05/23/22 1145  BP: 128/72  Pulse: 78  SpO2: 94%  Height: 5\' 3"  (1.6 m)     Physical Exam Constitutional:      General:  She is not in acute distress.    Appearance: She is ill-appearing.  HENT:     Head: Normocephalic and atraumatic.  Eyes:     General: No scleral icterus.     Conjunctiva/sclera: Conjunctivae normal.     Pupils: Pupils are equal, round, and reactive to light.  Cardiovascular:     Rate and Rhythm: Normal rate and regular rhythm.     Pulses: Normal pulses.     Heart sounds: Normal heart sounds. No murmur heard. Pulmonary:     Effort: Pulmonary effort is normal.     Breath sounds: Rhonchi present. No wheezing or rales.  Abdominal:     General: Bowel sounds are normal.     Palpations: Abdomen is soft.  Musculoskeletal:     Right lower leg: No edema.     Left lower leg: No edema.  Lymphadenopathy:     Cervical: No cervical adenopathy.  Skin:    General: Skin is warm and dry.  Neurological:     General: No focal deficit present.     Mental Status: She is alert.  Psychiatric:        Mood and Affect: Mood normal.        Behavior: Behavior normal.        Thought Content: Thought content normal.        Judgment: Judgment normal.    CBC    Component Value Date/Time   WBC 4.0 04/29/2022 0552   RBC 3.85 (L) 04/29/2022 0552   HGB 11.8 (L) 04/29/2022 0552   HGB 14.0 10/11/2019 1550   HCT 37.1 04/29/2022 0552   HCT 41.1 10/11/2019 1550   PLT 103 (L) 04/29/2022 0552   PLT 100 (LL) 10/11/2019 1550   MCV 96.4 04/29/2022 0552   MCV 88 10/11/2019 1550   MCH 30.6 04/29/2022 0552   MCHC 31.8 04/29/2022 0552   RDW 13.8 04/29/2022 0552   RDW 13.5 10/11/2019 1550   LYMPHSABS 1.0 04/23/2022 1846   LYMPHSABS 1.3 10/11/2019 1550   MONOABS 0.5 04/23/2022 1846   EOSABS 0.2 04/23/2022 1846   EOSABS 0.3 10/11/2019 1550   BASOSABS 0.0 04/23/2022 1846   BASOSABS 0.0 10/11/2019 1550      Latest Ref Rng & Units 04/29/2022    5:52 AM 04/28/2022    4:51 AM 04/27/2022    5:24 AM  BMP  Glucose 70 - 99 mg/dL 118  94  66   BUN 8 - 23 mg/dL 7  <5  6   Creatinine 0.44 - 1.00 mg/dL 0.44  <0.30  0.43   Sodium 135 - 145 mmol/L 135  136  131   Potassium 3.5 - 5.1 mmol/L 3.6  3.7  4.3   Chloride 98 - 111 mmol/L 107  108  104   CO2 22 - 32 mmol/L 25  25  23     Calcium 8.9 - 10.3 mg/dL 8.6  8.7  8.6    Chest imaging: PET CT 05/16/22 1. The progressive sclerotic osseous metastases within the thoracolumbar spine are hypermetabolic, consistent with malignancy. 2. In addition, the progressive mediastinal adenopathy is hypermetabolic, and there is hypermetabolic activity associated with the increasing peripheral lung masses or infiltrates. The multiplicity, time course and sclerotic osseous lesions suggest lymphoma as a possible etiology. 3. Pulmonary assessment is confounded by underlying severe bronchiectasis with associated peribronchial inflammatory changes which may contribute to the hypermetabolic activity. 4. No evidence of soft tissue metastases in the neck, abdomen or pelvis. 5. Coronary and aortic atherosclerosis (ICD10-I70.0).  Emphysema (ICD10-J43.9).  PFT:    Latest Ref Rng & Units 06/06/2015    3:57 PM 10/11/2014   10:51 AM  PFT Results  FVC-Pre L 2.13  2.07   FVC-Predicted Pre % 72  70   FVC-Post L 2.20  2.00   FVC-Predicted Post % 75  67   Pre FEV1/FVC % % 71  70   Post FEV1/FCV % % 69  72   FEV1-Pre L 1.51  1.45   FEV1-Predicted Pre % 67  64   FEV1-Post L 1.51  1.45   DLCO uncorrected ml/min/mmHg 15.22  14.52   DLCO UNC% % 70  67   DLVA Predicted % 101  90   TLC L  5.40   TLC % Predicted %  113   RV % Predicted %  160     Labs:  Path:  Echo:  Heart Catheterization:  Assessment & Plan:   Lung mass - Plan: CBC with Differential, CBC with Differential  Bronchiectasis without complication (HCC) - Plan: albuterol (VENTOLIN HFA) 108 (90 Base) MCG/ACT inhaler  Metastatic cancer to spine ([place]) - Plan: oxyCODONE-acetaminophen (PERCOCET/ROXICET) 5-325 MG tablet  Discussion: Catherine Kelley is a 71 year old woman, daily smoker with bronchiectasis and COPD who is referred back to pulmonary clinic for lung masses.  She has hypermetabolic lung masses of the RUL along with hypermetabolic mediastinal adenopathy and  thoracic/lumbar spine lesions concerning for metastatic cancer. She is very high risk for primary lung cancer.   She is agreeable to having navigational bronchoscopy done along with EBUS to get biopsies of lung masses and mediastinal lymph nodes.   I have sent in prescription for pain medicine due to her back pain. She is to continue to use stool softeners.  She is to start yupelri nebulizer solution for her bronchiectasis/COPD. She can continue albuterol inhaler as needed.   Follow up in 3 months.  Freda Jackson, MD [facility] Office: [phone]   Current Outpatient Medications:    alendronate (FOSAMAX) 70 MG tablet, Take 1 tablet (70 mg total) by mouth every 7 (seven) days. Take with a full glass of water on an empty stomach., Disp: 4 tablet, Rfl: 11   amLODipine (NORVASC) 2.5 MG tablet, Take 1 tablet (2.5 mg total) by mouth daily., Disp: 30 tablet, Rfl: 1   cetirizine (ZYRTEC) 10 MG tablet, Take 1 tablet (10 mg total) by mouth daily., Disp: 30 tablet, Rfl: 11   fluticasone (FLONASE) 50 MCG/ACT nasal spray, SPRAY 2 SPRAYS INTO EACH NOSTRIL EVERY DAY (Patient taking differently: Place 1 spray into both nostrils daily as needed for allergies.), Disp: 48 mL, Rfl: 2   folic acid (FOLVITE) 1 MG tablet, Take 1 tablet (1 mg total) by mouth daily., Disp: 30 tablet, Rfl: 2   lithium carbonate (LITHOBID) 300 MG CR tablet, Take 600 mg in am and 300 at bed time., Disp: 270 tablet, Rfl: 0   nortriptyline (PAMELOR) 50 MG capsule, Take 1 capsule (50 mg total) by mouth at bedtime., Disp: 90 capsule, Rfl: 0   oxyCODONE-acetaminophen (PERCOCET/ROXICET) 5-325 MG tablet, Take 1 tablet by mouth every 8 (eight) hours as needed for severe pain., Disp: 15 tablet, Rfl: 0   polyethylene glycol (MIRALAX / GLYCOLAX) 17 g packet, Take 17 g by mouth daily., Disp: 30 each, Rfl: 2   senna (SENOKOT) 8.6 MG TABS tablet, Take 2 tablets (17.2 mg total) by mouth 2 (two) times daily., Disp: 120  tablet, Rfl: 0   spironolactone (ALDACTONE)  50 MG tablet, TAKE 1 TABLET BY MOUTH EVERY DAY, Disp: 90 tablet, Rfl: 1   thiamine 100 MG tablet, Take 1 tablet (100 mg total) by mouth daily., Disp: 30 tablet, Rfl: 2   Tiotropium Bromide-Olodaterol (STIOLTO RESPIMAT) 2.5-2.5 MCG/ACT AERS, Inhale 2 puffs into the lungs daily., Disp: 1 each, Rfl: 2   venlafaxine (EFFEXOR) 75 MG tablet, Take 1 tablet (75 mg total) by mouth daily., Disp: 90 tablet, Rfl: 0   Vitamin D, Ergocalciferol, (DRISDOL) 1.25 MG (50000 UNIT) CAPS capsule, TAKE 1 CAPSULE (50,000 UNITS TOTAL) BY MOUTH EVERY 7 (SEVEN) DAYS, Disp: 12 capsule, Rfl: 0   albuterol (VENTOLIN HFA) 108 (90 Base) MCG/ACT inhaler, Inhale 2 puffs into the lungs every 6 (six) hours as needed for shortness of breath., Disp: 1 each, Rfl: 2

## 2022-05-26 ENCOUNTER — Encounter: Payer: Self-pay | Admitting: Emergency Medicine

## 2022-06-02 ENCOUNTER — Telehealth: Payer: Self-pay | Admitting: Registered Nurse

## 2022-06-02 ENCOUNTER — Other Ambulatory Visit: Payer: Self-pay | Admitting: Registered Nurse

## 2022-06-02 NOTE — Telephone Encounter (Signed)
Left message for patient to call back and schedule Medicare Annual Wellness Visit (AWV).   Please offer to do virtually or by telephone.  Left office number and my jabber [phone].  Last AWV:05/06/2021  Please schedule at anytime with Nurse Health Advisor.

## 2022-06-05 ENCOUNTER — Encounter (HOSPITAL_COMMUNITY): Payer: Self-pay | Admitting: Emergency Medicine

## 2022-06-05 NOTE — Progress Notes (Signed)
PCP - Maximiano Coss, NP Cardiologist - Dr [name] - Dr Berniece Andreas  Chest x-ray - 04/27/22 (1V) CT Super D Chest scheduled - 06/06/22 EKG - 04/17/22 Stress Test - 08/15/04 ECHO - 09/18/17 Cardiac Cath - n/a  ICD Pacemaker/Loop - n/a  Sleep Study -  n/a CPAP - none  Anesthesia review: Yes  STOP now taking any Aspirin (unless otherwise instructed by your surgeon), Aleve, Naproxen, Ibuprofen, Motrin, Advil, Goody's, BC's, all herbal medications, fish oil, and all vitamins.   Coronavirus Screening Does the patient have any of the following symptoms:  Cough yes/no: No Fever (>100.32F)  yes/no: No Runny nose yes/no: No Sore throat yes/no: No Difficulty breathing/shortness of breath  yes  Have you traveled in the last 14 days and where? yes/no: No  Husband Richard verbalized understanding of instructions that were given via phone.

## 2022-06-06 ENCOUNTER — Ambulatory Visit (HOSPITAL_COMMUNITY)
Admission: RE | Admit: 2022-06-06 | Discharge: 2022-06-06 | Disposition: A | Discharge status: Home / Self Care | Payer: Medicare PPO | Source: Ambulatory Visit | Attending: Emergency Medicine | Admitting: Emergency Medicine

## 2022-06-06 ENCOUNTER — Other Ambulatory Visit: Payer: Self-pay | Admitting: Emergency Medicine

## 2022-06-06 ENCOUNTER — Other Ambulatory Visit: Payer: Self-pay

## 2022-06-06 DIAGNOSIS — Z1152 Encounter for screening for COVID-19: Secondary | ICD-10-CM | POA: Diagnosis not present

## 2022-06-06 DIAGNOSIS — J479 Bronchiectasis, uncomplicated: Secondary | ICD-10-CM | POA: Diagnosis not present

## 2022-06-06 DIAGNOSIS — R918 Other nonspecific abnormal finding of lung field: Secondary | ICD-10-CM | POA: Insufficient documentation

## 2022-06-06 DIAGNOSIS — J439 Emphysema, unspecified: Secondary | ICD-10-CM | POA: Diagnosis not present

## 2022-06-06 LAB — SARS CORONAVIRUS 2 (TAT 6-24 HRS): SARS Coronavirus 2: NEGATIVE

## 2022-06-08 NOTE — Anesthesia Preprocedure Evaluation (Signed)
Anesthesia Evaluation  Patient identified by MRN, date of birth, ID band Patient awake    Reviewed: Allergy & Precautions, NPO status , Patient's Chart, lab work & pertinent test results  History of Anesthesia Complications Negative for: history of anesthetic complications  Airway Mallampati: II  TM Distance: >3 FB Neck ROM: Full    Dental  (+) Edentulous Upper, Edentulous Lower   Pulmonary asthma , COPD,  COPD inhaler, Current SmokerPatient did not abstain from smoking.,   RUL Mass     + wheezing      Cardiovascular hypertension, Pt. on medications Normal cardiovascular exam(-) Valvular Problems/Murmurs     Neuro/Psych  Headaches, PSYCHIATRIC DISORDERS Anxiety Depression Bipolar Disorder    GI/Hepatic Neg liver ROS, GERD  Controlled,  Endo/Other  negative endocrine ROS  Renal/GU negative Renal ROS     Musculoskeletal  (+) Arthritis ,   Abdominal   Peds  Hematology negative hematology ROS (+)   Anesthesia Other Findings   Reproductive/Obstetrics                            Anesthesia Physical Anesthesia Plan  ASA: 3  Anesthesia Plan: General   Post-op Pain Management: Tylenol PO (pre-op)* and Minimal or no pain anticipated   Induction: Intravenous  PONV Risk Score and Plan: 2 and Treatment may vary due to age or medical condition, Ondansetron and Dexamethasone  Airway Management Planned: Oral ETT  Additional Equipment: None  Intra-op Plan:   Post-operative Plan: Extubation in OR  Informed Consent: I have reviewed the patients History and Physical, chart, labs and discussed the procedure including the risks, benefits and alternatives for the proposed anesthesia with the patient or authorized representative who has indicated his/her understanding and acceptance.     Dental advisory given  Plan Discussed with: CRNA and Anesthesiologist  Anesthesia Plan Comments:         Anesthesia Quick Evaluation

## 2022-06-09 ENCOUNTER — Ambulatory Visit (HOSPITAL_COMMUNITY): Payer: Medicare PPO

## 2022-06-09 ENCOUNTER — Ambulatory Visit (HOSPITAL_BASED_OUTPATIENT_CLINIC_OR_DEPARTMENT_OTHER): Payer: Medicare PPO | Admitting: Physician Assistant

## 2022-06-09 ENCOUNTER — Encounter (HOSPITAL_COMMUNITY): Payer: Self-pay | Admitting: Emergency Medicine

## 2022-06-09 ENCOUNTER — Ambulatory Visit (HOSPITAL_COMMUNITY)
Admission: RE | Admit: 2022-06-09 | Discharge: 2022-06-09 | Disposition: A | Discharge status: Home / Self Care | Payer: Medicare PPO | Attending: Emergency Medicine | Admitting: Emergency Medicine

## 2022-06-09 ENCOUNTER — Ambulatory Visit (HOSPITAL_COMMUNITY): Payer: Medicare PPO | Admitting: Physician Assistant

## 2022-06-09 ENCOUNTER — Other Ambulatory Visit: Payer: Self-pay

## 2022-06-09 ENCOUNTER — Encounter (HOSPITAL_COMMUNITY)
Admission: RE | Disposition: A | Discharge status: Home / Self Care | Payer: Self-pay | Source: Home / Self Care | Attending: Emergency Medicine

## 2022-06-09 DIAGNOSIS — F419 Anxiety disorder, unspecified: Secondary | ICD-10-CM | POA: Diagnosis not present

## 2022-06-09 DIAGNOSIS — J449 Chronic obstructive pulmonary disease, unspecified: Secondary | ICD-10-CM | POA: Diagnosis not present

## 2022-06-09 DIAGNOSIS — Z79899 Other long term (current) drug therapy: Secondary | ICD-10-CM | POA: Diagnosis not present

## 2022-06-09 DIAGNOSIS — R9389 Abnormal findings on diagnostic imaging of other specified body structures: Secondary | ICD-10-CM | POA: Diagnosis not present

## 2022-06-09 DIAGNOSIS — M1990 Unspecified osteoarthritis, unspecified site: Secondary | ICD-10-CM | POA: Diagnosis not present

## 2022-06-09 DIAGNOSIS — F17200 Nicotine dependence, unspecified, uncomplicated: Secondary | ICD-10-CM | POA: Diagnosis not present

## 2022-06-09 DIAGNOSIS — F17210 Nicotine dependence, cigarettes, uncomplicated: Secondary | ICD-10-CM

## 2022-06-09 DIAGNOSIS — I10 Essential (primary) hypertension: Secondary | ICD-10-CM

## 2022-06-09 DIAGNOSIS — C801 Malignant (primary) neoplasm, unspecified: Secondary | ICD-10-CM | POA: Diagnosis not present

## 2022-06-09 DIAGNOSIS — R590 Localized enlarged lymph nodes: Secondary | ICD-10-CM

## 2022-06-09 DIAGNOSIS — C771 Secondary and unspecified malignant neoplasm of intrathoracic lymph nodes: Secondary | ICD-10-CM | POA: Insufficient documentation

## 2022-06-09 DIAGNOSIS — F319 Bipolar disorder, unspecified: Secondary | ICD-10-CM | POA: Diagnosis not present

## 2022-06-09 DIAGNOSIS — R918 Other nonspecific abnormal finding of lung field: Secondary | ICD-10-CM | POA: Diagnosis not present

## 2022-06-09 DIAGNOSIS — C3411 Malignant neoplasm of upper lobe, right bronchus or lung: Secondary | ICD-10-CM | POA: Diagnosis not present

## 2022-06-09 DIAGNOSIS — C3492 Malignant neoplasm of unspecified part of left bronchus or lung: Secondary | ICD-10-CM | POA: Diagnosis not present

## 2022-06-09 HISTORY — PX: VIDEO BRONCHOSCOPY WITH RADIAL ENDOBRONCHIAL ULTRASOUND: SHX6849

## 2022-06-09 HISTORY — PX: VIDEO BRONCHOSCOPY WITH ENDOBRONCHIAL ULTRASOUND: SHX6177

## 2022-06-09 HISTORY — PX: BRONCHIAL BRUSHINGS: SHX5108

## 2022-06-09 HISTORY — PX: BRONCHIAL BIOPSY: SHX5109

## 2022-06-09 HISTORY — PX: BRONCHIAL NEEDLE ASPIRATION BIOPSY: SHX5106

## 2022-06-09 HISTORY — PX: BRONCHIAL WASHINGS: SHX5105

## 2022-06-09 HISTORY — DX: Dyspnea, unspecified: R06.00

## 2022-06-09 LAB — BASIC METABOLIC PANEL
Anion gap: 8 (ref 5–15)
BUN: <5 mg/dL — ABNORMAL LOW (ref 8–23)
CO2: 26 mmol/L (ref 22–32)
Calcium: 9.1 mg/dL (ref 8.9–10.3)
Chloride: 100 mmol/L (ref 98–111)
Creatinine, Ser: 0.43 mg/dL — ABNORMAL LOW (ref 0.44–1.00)
GFR, Estimated: >60 mL/min (ref >60)
Glucose, Bld: 123 mg/dL — ABNORMAL HIGH (ref 70–99)
Potassium: 3.4 mmol/L — ABNORMAL LOW (ref 3.5–5.1)
Sodium: 134 mmol/L — ABNORMAL LOW (ref 135–145)

## 2022-06-09 SURGERY — BRONCHOSCOPY, WITH BIOPSY USING ELECTROMAGNETIC NAVIGATION
Anesthesia: General

## 2022-06-09 MED ORDER — PHENYLEPHRINE HCL-NACL 20-0.9 MG/250ML-% IV SOLN
INTRAVENOUS | Status: DC | PRN
  Administered 2022-06-09: 25 ug/min via INTRAVENOUS

## 2022-06-09 MED ORDER — CHLORHEXIDINE GLUCONATE 0.12 % MT SOLN
15.0000 mL | Freq: Once | OROMUCOSAL | Status: AC
  Administered 2022-06-09: 15 mL via OROMUCOSAL
  Filled 2022-06-09: qty 15

## 2022-06-09 MED ORDER — ONDANSETRON HCL 4 MG/2ML IJ SOLN: 4.0000 mg | Freq: Once | INTRAMUSCULAR | Status: DC | PRN

## 2022-06-09 MED ORDER — LIDOCAINE 2% (20 MG/ML) 5 ML SYRINGE
INTRAMUSCULAR | Status: DC | PRN
  Administered 2022-06-09: 40 mg via INTRAVENOUS

## 2022-06-09 MED ORDER — SUGAMMADEX SODIUM 200 MG/2ML IV SOLN
INTRAVENOUS | Status: DC | PRN
  Administered 2022-06-09: 200 mg via INTRAVENOUS

## 2022-06-09 MED ORDER — ALBUTEROL SULFATE HFA 108 (90 BASE) MCG/ACT IN AERS
INHALATION_SPRAY | RESPIRATORY_TRACT | Status: DC | PRN
  Administered 2022-06-09: 3 via RESPIRATORY_TRACT

## 2022-06-09 MED ORDER — ONDANSETRON HCL 4 MG/2ML IJ SOLN
INTRAMUSCULAR | Status: DC | PRN
  Administered 2022-06-09: 4 mg via INTRAVENOUS

## 2022-06-09 MED ORDER — ACETAMINOPHEN 500 MG PO TABS
1000.0000 mg | ORAL_TABLET | Freq: Once | ORAL | Status: AC
  Administered 2022-06-09: 1000 mg via ORAL
  Filled 2022-06-09: qty 2

## 2022-06-09 MED ORDER — OXYCODONE HCL 5 MG/5ML PO SOLN
5.0000 mg | Freq: Once | ORAL | Status: DC | PRN
  Filled 2022-06-09: qty 5

## 2022-06-09 MED ORDER — PROPOFOL 10 MG/ML IV BOLUS
INTRAVENOUS | Status: DC | PRN
  Administered 2022-06-09: 70 mg via INTRAVENOUS

## 2022-06-09 MED ORDER — FENTANYL CITRATE (PF) 100 MCG/2ML IJ SOLN: 25.0000 ug | INTRAMUSCULAR | Status: DC | PRN

## 2022-06-09 MED ORDER — OXYCODONE HCL 5 MG PO TABS
5.0000 mg | ORAL_TABLET | Freq: Once | ORAL | Status: DC | PRN
  Filled 2022-06-09: qty 1

## 2022-06-09 MED ORDER — FENTANYL CITRATE (PF) 100 MCG/2ML IJ SOLN
INTRAMUSCULAR | Status: DC | PRN
  Administered 2022-06-09: 25 ug via INTRAVENOUS

## 2022-06-09 MED ORDER — ROCURONIUM BROMIDE 10 MG/ML (PF) SYRINGE
PREFILLED_SYRINGE | INTRAVENOUS | Status: DC | PRN
  Administered 2022-06-09: 10 mg via INTRAVENOUS
  Administered 2022-06-09: 40 mg via INTRAVENOUS

## 2022-06-09 MED ORDER — LACTATED RINGERS IV SOLN: INTRAVENOUS | Status: DC

## 2022-06-09 MED ORDER — DEXAMETHASONE SODIUM PHOSPHATE 10 MG/ML IJ SOLN
INTRAMUSCULAR | Status: DC | PRN
  Administered 2022-06-09: 5 mg via INTRAVENOUS

## 2022-06-09 NOTE — Op Note (Signed)
Video Bronchoscopy with Robotic Assisted Bronchoscopic Navigation and Endobronchial Ultrasound Procedure Note  Date of Operation: 06/09/2022   Pre-op Diagnosis: Bilateral lung masses, mediastinal adenopathy  Post-op Diagnosis: Same  Surgeon: Baltazar Apo  Assistants: None  Anesthesia: General endotracheal anesthesia  Operation: Flexible video fiberoptic bronchoscopy with robotic assistance and biopsies.  Estimated Blood Loss: Minimal  Complications: None  Indications and History: Catherine Kelley is a 71 y.o. female with history of tobacco use, bronchiectasis.  She was found to have bilateral focal nodular opacities with mediastinal adenopathy that were hypermetabolic on PET scan.  Recommendation made to achieve tissue diagnosis via robotic assisted navigational bronchoscopy and endobronchial ultrasound with biopsies. The risks, benefits, complications, treatment options and expected outcomes were discussed with the patient.  The possibilities of pneumothorax, pneumonia, reaction to medication, pulmonary aspiration, perforation of a viscus, bleeding, failure to diagnose a condition and creating a complication requiring transfusion or operation were discussed with the patient who freely signed the consent.    Description of Procedure: The patient was seen in the Preoperative Area, was examined and was deemed appropriate to proceed.  The patient was taken to [facility] endoscopy room 3, identified as Catherine Kelley and the procedure verified as Flexible Video Fiberoptic Bronchoscopy.  A Time Out was held and the above information confirmed.   Prior to the date of the procedure a high-resolution CT scan of the chest was performed. Utilizing ION software program a virtual tracheobronchial tree was generated to allow the creation of distinct navigation pathways to the patient's parenchymal abnormalities. After being taken to the operating room general anesthesia was initiated and the patient  was orally  intubated. The video fiberoptic bronchoscope was introduced via the endotracheal tube and a general inspection was performed which showed normal right and left lung anatomy. Aspiration of the bilateral mainstems was completed to remove any remaining secretions. Robotic catheter inserted into patient's endotracheal tube.   Target #1 right upper lobe mass: The distinct navigation pathways prepared prior to this procedure were then utilized to navigate to patient's lesion identified on CT scan. The robotic catheter was secured into place and the vision probe was withdrawn.  Lesion location was approximated using fluoroscopy and radial endobronchial ultrasound for peripheral targeting.  Local registration and targeting was performed using Cios three-dimensional imaging.  Under fluoroscopic guidance transbronchial needle brushings, transbronchial needle biopsies, and transbronchial forceps biopsies were performed to be sent for cytology and pathology. A bronchioalveolar lavage was performed in the right upper lobe and sent for cytology.  Target #2 right upper lobe nodule: The distinct navigation pathways prepared prior to this procedure were then utilized to navigate to patient's lesion identified on CT scan. The robotic catheter was secured into place and the vision probe was withdrawn.  Lesion location was approximated using fluoroscopy and radial endobronchial ultrasound for peripheral targeting. Local registration and targeting was performed using Cios three-dimensional imaging. Under fluoroscopic guidance transbronchial needle brushings, transbronchial needle biopsies, and transbronchial forceps biopsies were performed to be sent for cytology and pathology.   The robotic scope was then withdrawn and the endobronchial ultrasound was used to identify and characterize the peritracheal, hilar and bronchial lymph nodes. Inspection showed nodal enlargement at station 7, 4R, 10 R. Using real-time ultrasound  guidance Wang needle biopsies were take from Station 7, 4R, 10 R nodes and were sent for cytology.   At the end of the procedure a general airway inspection was performed and there was no evidence of active bleeding. The bronchoscope  was removed.  The patient tolerated the procedure well. There was no significant blood loss and there were no obvious complications. A post-procedural chest x-ray is pending.  Samples Target #1: 1. Transbronchial needle brushings from right upper lobe mass 2. Transbronchial Wang needle biopsies from right upper lobe mass 3. Transbronchial forceps biopsies from right upper lobe mass 4. Bronchoalveolar lavage from right upper lobe  Samples Target #2: 1. Transbronchial needle brushings from right upper lobe nodule 2. Transbronchial Wang needle biopsies from right upper lobe nodule 3. Transbronchial forceps biopsies from right upper lobe nodule  EBUS samples: 1. Wang needle biopsies from 7 node 2. Wang needle biopsies from 4R node 3. Wang needle biopsies from 10 R node   Plans:  The patient will be discharged from the PACU to home when recovered from anesthesia and after chest x-ray is reviewed. We will review the cytology, pathology and microbiology results with the patient when they become available. Outpatient followup will be with Dr. Erin Fulling.    Baltazar Apo, MD, PhD 06/09/2022, 9:24 AM [facility] [phone] or if no answer before 7:00PM call [phone] For any issues after 7:00PM please call eLink [phone]

## 2022-06-09 NOTE — Interval H&P Note (Signed)
History and Physical Interval Note:  06/09/2022 7:25 AM  Catherine Kelley  has presented today for surgery, with the diagnosis of right upper lobe mass and mediastinal adenopathy.  The various methods of treatment have been discussed with the patient and family. After consideration of risks, benefits and other options for treatment, the patient has consented to  Procedure(s): ROBOTIC ASSISTED NAVIGATIONAL BRONCHOSCOPY (N/A) VIDEO BRONCHOSCOPY WITH ENDOBRONCHIAL ULTRASOUND (N/A) as a surgical intervention.  The patient's history has been reviewed, patient examined, no change in status, stable for surgery.  I have reviewed the patient's chart and labs.  Questions were answered to the patient's satisfaction.     [name]

## 2022-06-09 NOTE — Discharge Instructions (Signed)
Flexible Bronchoscopy, Care After This sheet gives you information about how to care for yourself after your test. Your doctor may also give you more specific instructions. If you have problems or questions, contact your doctor. Follow these instructions at home: Eating and drinking Do not eat or drink anything (not even water) for 2 hours after your test, or until your numbing medicine (local anesthetic) wears off. When your numbness is gone and your cough and gag reflexes have come back, you may: Eat only soft foods. Slowly drink liquids. The day after the test, go back to your normal diet. Driving Do not drive for 24 hours if you were given a medicine to help you relax (sedative). Do not drive or use heavy machinery while taking prescription pain medicine. General instructions  Take over-the-counter and prescription medicines only as told by your doctor. Return to your normal activities as told. Ask what activities are safe for you. Do not use any products that have nicotine or tobacco in them. This includes cigarettes and e-cigarettes. If you need help quitting, ask your doctor. Keep all follow-up visits as told by your doctor. This is important. It is very important if you had a tissue sample (biopsy) taken. Get help right away if: You have shortness of breath that gets worse. You get light-headed. You feel like you are going to pass out (faint). You have chest pain. You cough up: More than a little blood. More blood than before. Summary Do not eat or drink anything (not even water) for 2 hours after your test, or until your numbing medicine wears off. Do not use cigarettes. Do not use e-cigarettes. Get help right away if you have chest pain.  Please call our office for any questions or concerns.  [phone].  This information is not intended to replace advice given to you by your health care provider. Make sure you discuss any questions you have with your health care  provider. Document Released: 09/14/2009 Document Revised: 10/30/2017 Document Reviewed: 12/05/2016 Elsevier Patient Education  2020 [employer].

## 2022-06-09 NOTE — Anesthesia Postprocedure Evaluation (Signed)
Anesthesia Post Note  Patient: Catherine Kelley  Procedure(s) Performed: ROBOTIC ASSISTED NAVIGATIONAL BRONCHOSCOPY VIDEO BRONCHOSCOPY WITH ENDOBRONCHIAL ULTRASOUND BRONCHIAL BRUSHINGS BRONCHIAL NEEDLE ASPIRATION BIOPSIES BRONCHIAL WASHINGS BRONCHIAL BIOPSIES VIDEO BRONCHOSCOPY WITH RADIAL ENDOBRONCHIAL ULTRASOUND     Patient location during evaluation: PACU Anesthesia Type: General Level of consciousness: awake and alert Pain management: pain level controlled Vital Signs Assessment: post-procedure vital signs reviewed and stable Respiratory status: spontaneous breathing, nonlabored ventilation, respiratory function stable and patient connected to nasal cannula oxygen Cardiovascular status: blood pressure returned to baseline and stable Postop Assessment: no apparent nausea or vomiting Anesthetic complications: no   No notable events documented.  Last Vitals:  Vitals:   06/09/22 1015 06/09/22 1030  BP: 114/76 121/61  Pulse: 83 80  Resp: 20 (!) 21  Temp:    SpO2: (!) 89% 90%    Last Pain:  Vitals:   06/09/22 0930  TempSrc:   PainSc: 0-No pain                 Audry Pili

## 2022-06-09 NOTE — Anesthesia Procedure Notes (Signed)
Procedure Name: Intubation Date/Time: 06/09/2022 7:38 AM  Performed by: Kyung Rudd, CRNAPre-anesthesia Checklist: Patient identified, Emergency Drugs available, Suction available and Patient being monitored Patient Re-evaluated:Patient Re-evaluated prior to induction Oxygen Delivery Method: Circle system utilized Preoxygenation: Pre-oxygenation with 100% oxygen Induction Type: IV induction Ventilation: Mask ventilation without difficulty Laryngoscope Size: Mac and 3 Grade View: Grade I Tube type: Oral Tube size: 8.5 mm Number of attempts: 1 Airway Equipment and Method: Stylet Placement Confirmation: ETT inserted through vocal cords under direct vision, positive ETCO2 and breath sounds checked- equal and bilateral Secured at: 20 cm Tube secured with: Tape Dental Injury: Teeth and Oropharynx as per pre-operative assessment

## 2022-06-09 NOTE — Transfer of Care (Signed)
Immediate Anesthesia Transfer of Care Note  Patient: Catherine Kelley  Procedure(s) Performed: ROBOTIC ASSISTED NAVIGATIONAL BRONCHOSCOPY VIDEO BRONCHOSCOPY WITH ENDOBRONCHIAL ULTRASOUND BRONCHIAL BRUSHINGS BRONCHIAL NEEDLE ASPIRATION BIOPSIES BRONCHIAL WASHINGS BRONCHIAL BIOPSIES  Patient Location: PACU  Anesthesia Type:General  Level of Consciousness: drowsy  Airway & Oxygen Therapy: Patient Spontanous Breathing and Patient connected to nasal cannula oxygen  Post-op Assessment: Report given to RN, Post -op Vital signs reviewed and stable and Patient moving all extremities X 4  Post vital signs: Reviewed and stable  Last Vitals:  Vitals Value Taken Time  BP 131/59 06/09/22 0931  Temp    Pulse 90 06/09/22 0933  Resp 22 06/09/22 0933  SpO2 88 % 06/09/22 0933  Vitals shown include unvalidated device data.  Last Pain:  Vitals:   06/09/22 0605  TempSrc:   PainSc: 5       Patients Stated Pain Goal: 3 ([id number])  Complications: No notable events documented.

## 2022-06-10 ENCOUNTER — Encounter (HOSPITAL_COMMUNITY): Payer: Self-pay | Admitting: Emergency Medicine

## 2022-06-10 LAB — CYTOLOGY - NON PAP

## 2022-06-10 LAB — ACID FAST SMEAR (AFB, MYCOBACTERIA): Acid Fast Smear: NEGATIVE

## 2022-06-11 LAB — CULTURE, BAL-QUANTITATIVE W GRAM STAIN
Culture: NO GROWTH
Gram Stain: NONE SEEN

## 2022-06-11 LAB — CULTURE, RESPIRATORY W GRAM STAIN
Culture: NO GROWTH
Gram Stain: NONE SEEN

## 2022-06-13 ENCOUNTER — Encounter: Payer: Self-pay | Admitting: *Deleted

## 2022-06-13 ENCOUNTER — Telehealth: Payer: Self-pay | Admitting: Emergency Medicine

## 2022-06-13 ENCOUNTER — Telehealth: Payer: Self-pay | Admitting: Physician Assistant

## 2022-06-13 DIAGNOSIS — R918 Other nonspecific abnormal finding of lung field: Secondary | ICD-10-CM

## 2022-06-13 DIAGNOSIS — C3491 Malignant neoplasm of unspecified part of right bronchus or lung: Secondary | ICD-10-CM

## 2022-06-13 NOTE — Telephone Encounter (Signed)
Reviewed pathology results with the patient's husband Richard by phone this morning.  Transbronchial biopsies and nodal biopsies show adenocarcinoma.  I will refer to [facility] in [place].  He understands the plans will discuss with the patient

## 2022-06-13 NOTE — Telephone Encounter (Signed)
I called pt 2x, using both numbers provided in chart to confirm new pt appt with Cassie on 7/17, scheduled per referral. I was unable to reach pt, she did not return my messages. I cancelled appt that was scheduled since I was unable to confirm appt with pt. Will try to call again on Monday.

## 2022-06-13 NOTE — Progress Notes (Signed)
I received referral on Catherine Kelley today. I notified new patient coordinator to call and schedule her with Cassie PA-C and Dr. Julien Nordmann Monday next week with labs.

## 2022-06-14 LAB — AEROBIC/ANAEROBIC CULTURE W GRAM STAIN (SURGICAL/DEEP WOUND)
Culture: NO GROWTH
Gram Stain: NONE SEEN

## 2022-06-16 ENCOUNTER — Inpatient Hospital Stay: Payer: Medicare PPO | Admitting: Physician Assistant

## 2022-06-17 ENCOUNTER — Telehealth: Payer: Self-pay | Admitting: Internal Medicine

## 2022-06-17 NOTE — Telephone Encounter (Signed)
Scheduled appt per 7/14 referral. Pt's husband is aware of appt date and time.  Pt's husband is aware to arrive 15 mins prior to appt time and to bring and updated insurance card.  Pt's husband is aware of appt location.

## 2022-06-19 ENCOUNTER — Telehealth: Payer: Self-pay | Admitting: Radiation Oncology

## 2022-06-19 ENCOUNTER — Other Ambulatory Visit: Payer: Self-pay | Admitting: *Deleted

## 2022-06-19 DIAGNOSIS — C3411 Malignant neoplasm of upper lobe, right bronchus or lung: Secondary | ICD-10-CM

## 2022-06-19 NOTE — Progress Notes (Signed)
Referral to Radiation Oncology placed per Dr. Worthy Flank request.

## 2022-06-19 NOTE — Telephone Encounter (Signed)
Called patient to schedule a consultation w. Dr. Sondra Come. No answer, LVM for a return call.

## 2022-06-20 ENCOUNTER — Other Ambulatory Visit: Payer: Self-pay | Admitting: *Deleted

## 2022-06-20 NOTE — Progress Notes (Signed)
The proposed treatment discussed in conference is for discussion purpose only and is not a binding recommendation.  The patients have not been physically examined, or presented with their treatment options.  Therefore, final treatment plans cannot be decided.  

## 2022-06-23 ENCOUNTER — Telehealth: Payer: Self-pay | Admitting: Registered Nurse

## 2022-06-23 NOTE — Telephone Encounter (Signed)
Noted,  Thanks,  Denice Paradise

## 2022-06-23 NOTE — Progress Notes (Incomplete)
Thoracic Location of Tumor / Histology: stage IV (T3, N3, M1c) non-small cell lung cancer, adenocarcinoma   Patient presented with symptoms of: she was hospitalized with a bowel obstruction and it was found due to scans.  Biopsies revealed:   FINAL MICROSCOPIC DIAGNOSIS:  D. LUNG, RUL TARGET #2, BRUSHING:  - Malignant  - Adenocarcinoma   E. LUNG, RUL TARGET#2, FINE NEEDLE ASPIRATION AND BIOPSY:  - Malignant  - Adenocarcinoma   Tobacco/Marijuana/Snuff/ETOH use: has smoked 0.75 packs per day for 51 years.  Past/Anticipated interventions by cardiothoracic surgery, if any: Flexible video fiberoptic bronchoscopy with robotic assistance and biopsies on 06/09/22 by Dr. Lamonte Sakai.  Past/Anticipated interventions by medical oncology, if any: will see Dr. Julien Nordmann on 06/24/22.  Signs/Symptoms Weight changes, if any: 20 lbs - has a poor appetite and eats small amounts Respiratory complaints, if any: has a productive cough with white sputum Hemoptysis, if any: not currently but has in the past Pain issues, if any:  has pain in her lower back especially at night and in the mornings.    SAFETY ISSUES: Prior radiation? no Pacemaker/ICD? no  Possible current pregnancy?no Is the patient on methotrexate? no  Current Complaints / other details:  Patient is here with her husband.    BP (!) 107/46 (BP Location: Left Arm, Patient Position: Sitting, Cuff Size: Normal)   Pulse 79   Temp 98.1 F (36.7 C)   Resp 20   Ht 5\' 3"  (1.6 m)   SpO2 95%   BMI 16.32 kg/m

## 2022-06-23 NOTE — Telephone Encounter (Signed)
Richard called stating Catherine Kelley will start cancer treatment on Thursday 7/2t.  Patient wanted her PCP to know- unknown of cancer name he cannot pronounce or remember name.

## 2022-06-24 ENCOUNTER — Other Ambulatory Visit: Payer: Self-pay

## 2022-06-24 ENCOUNTER — Encounter: Payer: Self-pay | Admitting: Internal Medicine

## 2022-06-24 ENCOUNTER — Inpatient Hospital Stay: Payer: Medicare PPO | Attending: Internal Medicine | Admitting: Internal Medicine

## 2022-06-24 ENCOUNTER — Encounter (HOSPITAL_COMMUNITY): Payer: Self-pay | Admitting: Internal Medicine

## 2022-06-24 ENCOUNTER — Inpatient Hospital Stay: Payer: Medicare PPO

## 2022-06-24 VITALS — BP 128/53 | HR 74 | Temp 97.5°F | Resp 16 | Ht 62.5 in | Wt 92.1 lb

## 2022-06-24 DIAGNOSIS — C7951 Secondary malignant neoplasm of bone: Secondary | ICD-10-CM | POA: Diagnosis not present

## 2022-06-24 DIAGNOSIS — C3491 Malignant neoplasm of unspecified part of right bronchus or lung: Secondary | ICD-10-CM | POA: Diagnosis not present

## 2022-06-24 DIAGNOSIS — C3490 Malignant neoplasm of unspecified part of unspecified bronchus or lung: Secondary | ICD-10-CM

## 2022-06-24 DIAGNOSIS — C3411 Malignant neoplasm of upper lobe, right bronchus or lung: Secondary | ICD-10-CM | POA: Insufficient documentation

## 2022-06-24 DIAGNOSIS — R918 Other nonspecific abnormal finding of lung field: Secondary | ICD-10-CM

## 2022-06-24 LAB — CBC WITH DIFFERENTIAL (CANCER CENTER ONLY)
Abs Immature Granulocytes: 0.05 10*3/uL (ref 0.00–0.07)
Basophils Absolute: 0 10*3/uL (ref 0.0–0.1)
Basophils Relative: 0 %
Eosinophils Absolute: 0.2 10*3/uL (ref 0.0–0.5)
Eosinophils Relative: 2 %
HCT: 39.3 % (ref 36.0–46.0)
Hemoglobin: 12.9 g/dL (ref 12.0–15.0)
Immature Granulocytes: 1 %
Lymphocytes Relative: 10 %
Lymphs Abs: 0.8 10*3/uL (ref 0.7–4.0)
MCH: 28.9 pg (ref 26.0–34.0)
MCHC: 32.8 g/dL (ref 30.0–36.0)
MCV: 87.9 fL (ref 80.0–100.0)
Monocytes Absolute: 0.5 10*3/uL (ref 0.1–1.0)
Monocytes Relative: 6 %
Neutro Abs: 6.2 10*3/uL (ref 1.7–7.7)
Neutrophils Relative %: 81 %
Platelet Count: 156 10*3/uL (ref 150–400)
RBC: 4.47 MIL/uL (ref 3.87–5.11)
RDW: 14 % (ref 11.5–15.5)
WBC Count: 7.7 10*3/uL (ref 4.0–10.5)
nRBC: 0 % (ref 0.0–0.2)

## 2022-06-24 LAB — CMP (CANCER CENTER ONLY)
ALT: 22 U/L (ref 0–44)
AST: 24 U/L (ref 15–41)
Albumin: 3.2 g/dL — ABNORMAL LOW (ref 3.5–5.0)
Alkaline Phosphatase: 219 U/L — ABNORMAL HIGH (ref 38–126)
Anion gap: 4 — ABNORMAL LOW (ref 5–15)
BUN: 9 mg/dL (ref 8–23)
CO2: 25 mmol/L (ref 22–32)
Calcium: 9.4 mg/dL (ref 8.9–10.3)
Chloride: 102 mmol/L (ref 98–111)
Creatinine: 0.4 mg/dL — ABNORMAL LOW (ref 0.44–1.00)
GFR, Estimated: >60 mL/min (ref >60)
Glucose, Bld: 90 mg/dL (ref 70–99)
Potassium: 3.9 mmol/L (ref 3.5–5.1)
Sodium: 131 mmol/L — ABNORMAL LOW (ref 135–145)
Total Bilirubin: 0.3 mg/dL (ref 0.3–1.2)
Total Protein: 6.7 g/dL (ref 6.5–8.1)

## 2022-06-24 NOTE — Patient Instructions (Signed)
Steps to Quit Smoking Smoking tobacco is the leading cause of preventable death. It can affect almost every organ in the body. Smoking puts you and people around you at risk for many serious, long-lasting (chronic) diseases. Quitting smoking can be hard, but it is one of the best things that you can do for your health. It is never too late to quit. Do not give up if you cannot quit the first time. Some people need to try many times to quit. Do your best to stick to your quit plan, and talk with your doctor if you have any questions or concerns. How do I get ready to quit? Pick a date to quit. Set a date within the next 2 weeks to give you time to prepare. Write down the reasons why you are quitting. Keep this list in places where you will see it often. Tell your family, friends, and co-workers that you are quitting. Their support is important. Talk with your doctor about the choices that may help you quit. Find out if your health insurance will pay for these treatments. Know the people, places, things, and activities that make you want to smoke (triggers). Avoid them. What first steps can I take to quit smoking? Throw away all cigarettes at home, at work, and in your car. Throw away the things that you use when you smoke, such as ashtrays and lighters. Clean your car. Empty the ashtray. Clean your home, including curtains and carpets. What can I do to help me quit smoking? Talk with your doctor about taking medicines and seeing a counselor. You are more likely to succeed when you do both. If you are pregnant or breastfeeding: Talk with your doctor about counseling or other ways to quit smoking. Do not take medicine to help you quit smoking unless your doctor tells you to. Quit right away Quit smoking completely, instead of slowly cutting back on how much you smoke over a period of time. Stopping smoking right away may be more successful than slowly quitting. Go to counseling. In-person is best  if this is an option. You are more likely to quit if you go to counseling sessions regularly. Take medicine You may take medicines to help you quit. Some medicines need a prescription, and some you can buy over-the-counter. Some medicines may contain a drug called nicotine to replace the nicotine in cigarettes. Medicines may: Help you stop having the desire to smoke (cravings). Help to stop the problems that come when you stop smoking (withdrawal symptoms). Your doctor may ask you to use: Nicotine patches, gum, or lozenges. Nicotine inhalers or sprays. Non-nicotine medicine that you take by mouth. Find resources Find resources and other ways to help you quit smoking and remain smoke-free after you quit. They include: Online chats with a counselor. Phone quitlines. Printed self-help materials. Support groups or group counseling. Text messaging programs. Mobile phone apps. Use apps on your mobile phone or tablet that can help you stick to your quit plan. Examples of free services include Quit Guide from the CDC and smokefree.gov  What can I do to make it easier to quit?  Talk to your family and friends. Ask them to support and encourage you. Call a phone quitline, such as 1-800-QUIT-NOW, reach out to support groups, or work with a counselor. Ask people who smoke to not smoke around you. Avoid places that make you want to smoke, such as: Bars. Parties. Smoke-break areas at work. Spend time with people who do not smoke. Lower   the stress in your life. Stress can make you want to smoke. Try these things to lower stress: Getting regular exercise. Doing deep-breathing exercises. Doing yoga. Meditating. What benefits will I see if I quit smoking? Over time, you may have: A better sense of smell and taste. Less coughing and sore throat. A slower heart rate. Lower blood pressure. Clearer skin. Better breathing. Fewer sick days. Summary Quitting smoking can be hard, but it is one of  the best things that you can do for your health. Do not give up if you cannot quit the first time. Some people need to try many times to quit. When you decide to quit smoking, make a plan to help you succeed. Quit smoking right away, not slowly over a period of time. When you start quitting, get help and support to keep you smoke-free. This information is not intended to replace advice given to you by your health care provider. Make sure you discuss any questions you have with your health care provider. Document Revised: 11/08/2021 Document Reviewed: 11/08/2021 Elsevier Patient Education  2023 [employer]  

## 2022-06-24 NOTE — Progress Notes (Signed)
[facility] Telephone:([phone]) [phone]   [phone]  CONSULT NOTE  REFERRING PHYSICIAN: Dr. Baltazar Apo  REASON FOR CONSULTATION:  71 years old white female with lung cancer  HPI Catherine Kelley is a 71 y.o. female with past medical history significant for osteoarthritis, anxiety, asthma, bipolar disorder, chronic bronchitis, chronic low back pain, COPD, depression, degenerative disc disease of the lumbar spine, heart murmur, hypertension as well as migraine headache and long history of smoking.  She has been followed by pulmonary medicine for history of bronchiectasis and COPD.  She also has a history of chronic aspiration with dysphagia and weight loss.  CT scan of the abdomen and pelvis on 04/23/2022.  It showed interval development of peribronchial nodularity within the left lung base and numerous air-fluid levels within the bronchitic airways of the lung base with a stable spiculated opacity within the right lung base and progressively sclerotic lesions within the visualized thoracolumbar spine suspicious for progressive osseous metastasis.  The patient had CT scan of the chest on Apr 24, 2022 and that showed significant interval enlargement of a subpleural mass of the posterior right upper lobe measuring 3.3 x 2.2 cm.  There was additional new masslike consolidation laterally measuring 4.5 x 1.9 cm.  Multiple additional foci of less solid-appearing subpleural groundglass and consolidative airspace disease within the medial right upper lobe and subpleural left lower lobe.  There was also interval enlargement of mediastinal and bilateral hilar lymph nodes the largest pretracheal node measuring 1.8 x 1.3 cm.  A bone scan on 04/24/2022 showed abnormal increased uptake within the thoracolumbar spine at T11-L3 corresponding to sclerotic foci on recent CT consistent with osseous metastatic disease.  The patient had a PET scan on 05/16/2022 and that showed the progressive sclerotic  osseous metastases within the thoracolumbar spine are hypermetabolic consistent with malignancy.  In addition the progressive mediastinal lymphadenopathy is hypermetabolic and there is hypermetabolic activity associated with the increasing peripheral lung masses or infiltrate.  The multiplicity, time course and sclerotic osseous lesions suggest lymphoma as a possible etiology no evidence of soft tissue metastasis in the neck, abdomen or pelvis. On June 09, 2022 the patient underwent video bronchoscopy with robotic assisted bronchoscopic navigation and endobronchial ultrasound procedure by Dr. Lamonte Sakai. The final pathology ([id number]) of the right upper lobe biopsy as well as station 7 and 4R lymph nodes showed malignant cells consistent with adenocarcinoma.  PD-L1 expression was 0.  Molecular studies by foundation 1 are still pending. The patient was referred to me today for evaluation and recommendation regarding her condition. When seen today the patient continues to complain of the persistent back pain she takes Tylenol on as-needed basis.  She denied having any chest pain but continues to have cough and shortness of breath with exertion with no hemoptysis.  She has no nausea, vomiting, diarrhea or constipation.  She has no headache or visual changes.  She denied having any fever or chills. Family history significant for father who had heart disease and mother had a stroke.  She had a maternal aunt who had breast cancer. The patient is married and has 2 daughters.  She was accompanied today by her husband Catherine Kelley and her daughter Catherine Kelley.  The patient used to work as a Theme park manager and also worked at a [employer] education [employer].  She has a history of smoking 1 pack/day for around 51 years.  She has no history of alcohol or drug abuse.  HPI  Past Medical History:  Diagnosis Date  Anxiety    Arthritis    "joints" (09/18/2017)   Asthma    Bipolar I disorder ([place]) 10/11/2019   CAP (community acquired  pneumonia) 09/18/2017   Cat allergies    Chronic bronchitis ([place])    "get it alot; maybe not q yr" (07/06/2014)   Chronic lower back pain    "spurs and pinched nerves" (09/18/2017)   COPD (chronic obstructive pulmonary disease) ([place])    DDD (degenerative disc disease), lumbar    Depression    psychiatrist Dr. Toy Care   Diastolic dysfunction    Dyspnea    Emphysematous COPD ([place])    changes in right base along with patchy areas on last x-ray   Fall as cause of accidental injury at home as place of occurrence 10/11/2019   occasional use of walker   Fall involving wheelchair as cause of accidental injury 10/11/2019   Falls frequently    "several in 2017; fell at dr's office yesterday" (09/18/2017)   GERD (gastroesophageal reflux disease)    HCAP (healthcare-associated pneumonia) 02/12/2016   Heart murmur    History of cardiovascular stress test 08/15/2004   EF of 70% -- Normal stress cardiolite.  There is no evidence of ischemia and there is normal LV function. -- [name]. MD   History of echocardiogram 12/24/2006   Est. EF of 70-48% NORMAL LV SYSTOLIC FUNCTION WITH IMPAIRED RELAXATION -- MILD AORTIC SCLEROSIS -- NORMAL PALONARY ARTERY PRESSURE -- NO OLD ECHOS FOR COMPARISON -- [name], MD   Hypertension    essential hypertension   Migraine    "when I was having my periods; none since then" (09/18/2017)   Necrotic pneumonia ([place])    recurrent/notes 02/12/2016   Pneumonia    "several times since May 2015" (07/06/2014)   Pollen allergies    Tobacco abuse    smoking about 1/2 pk of cigarettes a day   Tobacco dependence due to cigarettes 10/11/2019   Traumatic hematoma of knee, right, initial encounter 10/11/2019    Past Surgical History:  Procedure Laterality Date   BIOPSY  04/28/2022   Procedure: BIOPSY;  Surgeon: Carol Ada, MD;  Location: [facility] ENDOSCOPY;  Service: Gastroenterology;;   BRONCHIAL BIOPSY  06/09/2022   Procedure: BRONCHIAL BIOPSIES;  Surgeon: [name], MD;  Location: [facility] ENDOSCOPY;  Service: Pulmonary;;   BRONCHIAL BRUSHINGS  06/09/2022   Procedure: BRONCHIAL BRUSHINGS;  Surgeon: [name], MD;  Location: [facility] ENDOSCOPY;  Service: Pulmonary;;   BRONCHIAL NEEDLE ASPIRATION BIOPSY  06/09/2022   Procedure: BRONCHIAL NEEDLE ASPIRATION BIOPSIES;  Surgeon: [name], MD;  Location: [facility] ENDOSCOPY;  Service: Pulmonary;;   BRONCHIAL WASHINGS  06/09/2022   Procedure: BRONCHIAL WASHINGS;  Surgeon: [name], MD;  Location: [facility] ENDOSCOPY;  Service: Pulmonary;;   CAROTID STENT     COLONOSCOPY N/A 04/28/2022   Procedure: COLONOSCOPY;  Surgeon: Carol Ada, MD;  Location: [facility] ENDOSCOPY;  Service: Gastroenterology;  Laterality: N/A;   DILATION AND CURETTAGE OF UTERUS     FOREIGN BODY REMOVAL  04/28/2022   Procedure: FOREIGN BODY REMOVAL;  Surgeon: Carol Ada, MD;  Location: [facility] ENDOSCOPY;  Service: Gastroenterology;;  Fecal Impaction Removed   TUBAL LIGATION  1986   VIDEO BRONCHOSCOPY WITH ENDOBRONCHIAL ULTRASOUND N/A 06/09/2022   Procedure: VIDEO BRONCHOSCOPY WITH ENDOBRONCHIAL ULTRASOUND;  Surgeon: [name], MD;  Location: [facility] ENDOSCOPY;  Service: Pulmonary;  Laterality: N/A;   VIDEO BRONCHOSCOPY WITH RADIAL ENDOBRONCHIAL ULTRASOUND  06/09/2022   Procedure: VIDEO BRONCHOSCOPY WITH RADIAL ENDOBRONCHIAL ULTRASOUND;  Surgeon: [name], MD;  Location: [facility] ENDOSCOPY;  Service: Pulmonary;;    Family History  Problem Relation Age of Onset   Stroke Mother    Heart disease Father    Hyperlipidemia Father    Hypertension Father    Breast cancer Maternal Aunt    Asthma Maternal Grandmother     Social History Social History   Tobacco Use   Smoking status: Every Day    Packs/day: 0.75    Years: 46.00    Total pack years: 34.50    Types: Cigarettes   Smokeless tobacco: Never  Vaping Use   Vaping Use: Never used  Substance Use Topics   Alcohol use: Yes    Alcohol/week: 0.0 standard drinks of alcohol    Comment:  09/18/2017 "might have 1 drink/month; if that"   Drug use: No    Allergies  Allergen Reactions   Sulfa Drugs Cross Reactors Hives and Rash   Ceclor [Cefaclor] Hives    Tolerated ceftazidime December 2016   Depakote Er [Divalproex Sodium Er] Other (See Comments)    Patient remarked that when she was taking this TWICE a day, she became "clumsy" and felt more prone to stumbling   Escitalopram Oxalate Other (See Comments)    Pt does not recall ever taking medication (??)   Latex Itching   Other Other (See Comments)    Hydrocort Cream  Unknown   Sertraline Hcl Other (See Comments)    Severe headaches    Tape Itching and Rash    Band-Aids, also (PAPER TAPE IS TOLERATED)    Current Outpatient Medications  Medication Sig Dispense Refill   albuterol (VENTOLIN HFA) 108 (90 Base) MCG/ACT inhaler Inhale 2 puffs into the lungs every 6 (six) hours as needed for shortness of breath. 1 each 2   amLODipine (NORVASC) 2.5 MG tablet Take 1 tablet (2.5 mg total) by mouth daily. 30 tablet 1   cetirizine (ZYRTEC) 10 MG tablet Take 1 tablet (10 mg total) by mouth daily. 30 tablet 11   folic acid (FOLVITE) 1 MG tablet Take 1 tablet (1 mg total) by mouth daily. 30 tablet 2   lithium carbonate (LITHOBID) 300 MG CR tablet Take 600 mg in am and 300 at bed time. 270 tablet 0   nortriptyline (PAMELOR) 50 MG capsule Take 1 capsule (50 mg total) by mouth at bedtime. 90 capsule 0   oxyCODONE-acetaminophen (PERCOCET/ROXICET) 5-325 MG tablet Take 1 tablet by mouth every 8 (eight) hours as needed for severe pain. 15 tablet 0   polyethylene glycol (MIRALAX / GLYCOLAX) 17 g packet Take 17 g by mouth daily. 30 each 2   senna (SENOKOT) 8.6 MG TABS tablet Take 2 tablets (17.2 mg total) by mouth 2 (two) times daily. 120 tablet 0   spironolactone (ALDACTONE) 50 MG tablet TAKE 1 TABLET BY MOUTH EVERY DAY 90 tablet 1   thiamine 100 MG tablet Take 1 tablet (100 mg total) by mouth daily. 30 tablet 2   venlafaxine (EFFEXOR) 75  MG tablet Take 1 tablet (75 mg total) by mouth daily. 90 tablet 0   Vitamin D, Ergocalciferol, (DRISDOL) 1.25 MG (50000 UNIT) CAPS capsule TAKE 1 CAPSULE (50,000 UNITS TOTAL) BY MOUTH EVERY 7 (SEVEN) DAYS 12 capsule 0   No current facility-administered medications for this visit.    Review of Systems  Constitutional: positive for fatigue and weight loss Eyes: negative Ears, nose, mouth, throat, and face: negative Respiratory: positive for cough and dyspnea on exertion Cardiovascular: negative Gastrointestinal:  negative Genitourinary:negative Integument/breast: negative Hematologic/lymphatic: negative Musculoskeletal:positive for back pain Neurological: negative Behavioral/Psych: negative Endocrine: negative Allergic/Immunologic: negative  Physical Exam  IFO:YDXAJ, healthy, no distress, ill looking, and malnourished SKIN: skin color, texture, turgor are normal HEAD: Normocephalic, No masses, lesions, tenderness or abnormalities EYES: normal, PERRLA, Conjunctiva are pink and non-injected EARS: External ears normal, Canals clear OROPHARYNX:no exudate, no erythema, and lips, buccal mucosa, and tongue normal  NECK: supple, no adenopathy, no JVD LYMPH:  no palpable lymphadenopathy, no hepatosplenomegaly BREAST:not examined LUNGS: clear to auscultation , and palpation HEART: regular rate & rhythm, no murmurs, and no gallops ABDOMEN:abdomen soft, non-tender, normal bowel sounds, and no masses or organomegaly BACK: Back symmetric, no curvature., No CVA tenderness EXTREMITIES:no joint deformities, effusion, or inflammation, no edema  NEURO: alert & oriented x 3 with fluent speech, no focal motor/sensory deficits  PERFORMANCE STATUS: ECOG 1  LABORATORY DATA: Lab Results  Component Value Date   WBC 7.2 05/23/2022   HGB 12.8 05/23/2022   HCT 38.9 05/23/2022   MCV 89.5 05/23/2022   PLT 132.0 (L) 05/23/2022      Chemistry      Component Value Date/Time   NA 134 (L) 06/09/2022  0607   NA 136 07/06/2020 1213   K 3.4 (L) 06/09/2022 0607   CL 100 06/09/2022 0607   CO2 26 06/09/2022 0607   BUN <5 (L) 06/09/2022 0607   BUN 7 (L) 07/06/2020 1213   CREATININE 0.43 (L) 06/09/2022 0607   CREATININE 0.45 (L) 06/27/2016 1451      Component Value Date/Time   CALCIUM 9.1 06/09/2022 0607   ALKPHOS 173 (H) 04/25/2022 0621   AST 22 04/25/2022 0621   ALT 20 04/25/2022 0621   BILITOT 0.8 04/25/2022 0621   BILITOT 0.4 04/13/2020 1334       RADIOGRAPHIC STUDIES: DG Chest Port 1 View  Result Date: 06/09/2022 CLINICAL DATA:  Status post bronchoscopy with biopsy. EXAM: PORTABLE CHEST 1 VIEW COMPARISON:  Chest x-ray dated 04/27/2022. FINDINGS: Heart size and mediastinal contours are stable. Patchy bilateral airspace opacities and coarse lung markings throughout both lungs are unchanged in the short-term interval. No pleural effusion or pneumothorax is seen. IMPRESSION: No pneumothorax, hemothorax or other complicating features seen status post bronchoscopy with biopsies. Electronically Signed   By: Franki Cabot M.D.   On: 06/09/2022 10:20   DG C-ARM BRONCHOSCOPY  Result Date: 06/09/2022 C-ARM BRONCHOSCOPY: Fluoroscopy was utilized by the requesting physician.  No radiographic interpretation.   CT Super D Chest Wo Contrast  Result Date: 06/08/2022 CLINICAL DATA:  Lung mass * Tracking Code: BO * EXAM: CT CHEST WITHOUT CONTRAST TECHNIQUE: Multidetector CT imaging of the chest was performed using thin slice collimation for electromagnetic bronchoscopy planning purposes, without intravenous contrast. RADIATION DOSE REDUCTION: This exam was performed according to the departmental dose-optimization program which includes automated exposure control, adjustment of the mA and/or kV according to patient size and/or use of iterative reconstruction technique. COMPARISON:  PET-CT, 05/16/2022, CT chest, 04/24/2022 FINDINGS: Cardiovascular: Aortic atherosclerosis. Normal heart size. Three-vessel  coronary artery calcifications. No pericardial effusion. Mediastinum/Nodes: Similar enlarged mediastinal and bilateral hilar lymph nodes, pretracheal nodes measuring up to 1.5 x 1.2 cm (series 2, image 24). Thyroid gland, trachea, and esophagus demonstrate no significant findings. Lungs/Pleura: Multiple bilateral foci of subpleural, masslike consolidation are not significantly changed, for example in the posterior right upper lobe measuring 3.5 x 2.1 cm (series 7, image 59) and in the peripheral right upper lobe measuring 4.3 x 1.9 cm (series 7,  image 67). Severe, varicoid and cystic bronchiectasis in the right greater than left bilateral lung bases with extensive bronchiolar plugging and associated centrilobular and tree-in-bud nodularity, unchanged (series 7, image 109). Severe underlying centrilobular and paraseptal emphysema. Diffuse bilateral bronchial wall thickening. No pleural effusion or pneumothorax. Upper Abdomen: No acute abnormality.  Partially imaged splenomegaly. Musculoskeletal: No chest wall abnormality. Unchanged dense osseous sclerosis of T11, T12 and L1 (series 4, image 86) IMPRESSION: 1. Multiple bilateral foci of subpleural, masslike pulmonary consolidation are not significantly changed, and given persistence and FDG avidity on prior PET-CT remain highly suspicious for malignancy, including pulmonary lymphomatous involvement. 2. Similar enlarged mediastinal and bilateral hilar lymph nodes, as above previously PET avid. 3. Unchanged dense osseous sclerosis of T11, T12 and L1, as above previously PET avid. 4. Severe, varicoid and cystic bronchiectasis in the right greater than left bilateral lung bases with extensive bronchiolar plugging and associated centrilobular and tree-in-bud nodularity, unchanged, consistent with chronic sequelae of atypical infection or aspiration, likely ongoing. 5. Severe emphysema. 6. Coronary artery disease. 7. Partially imaged splenomegaly. Aortic Atherosclerosis  (ICD10-I70.0) and Emphysema (ICD10-J43.9). Electronically Signed   By: Delanna Ahmadi M.D.   On: 06/08/2022 13:48    ASSESSMENT: This is a very pleasant 71 years old white female recently diagnosed with stage IV (T3, N3, M1c) non-small cell lung cancer, adenocarcinoma presented with multiple pulmonary nodules in the right lung in addition to left upper lobe lung nodule, mediastinal and bilateral hilar lymphadenopathy as well as multiple metastatic bone disease diagnosed in July 2023. PD-L1 expression is 0%  PLAN: I had a lengthy discussion with the patient and her family today about her current disease stage, prognosis and treatment options. I personally and independently reviewed the scan images and discussed the result and showed the images to the patient and her family. I explained to the patient that she has incurable condition and all the treatment are of palliative nature. I recommended for the patient to complete the staging work-up by ordering MRI of the brain to rule out brain metastasis.  She is also scheduled to see Dr. Sondra Come from radiation oncology later this week for discussion of the palliative radiotherapy to the metastatic bone lesions in the thoracic spine. I will wait for the molecular studies before discussing all the treatment options with the patient at this point.  If the patient has an actionable mutation, we will consider her for treatment with targeted therapy but if she has no actionable mutations, we would consider her for treatment with a combination of systemic chemotherapy with carboplatin, Alimta and Keytruda followed by maintenance Alimta and Keytruda. The patient was also given the option of palliative care and hospice referral but she is interested in treatment. I will see her back for follow-up visit in around 2 weeks for evaluation and more detailed discussion of her treatment options based on the final molecular studies and staging work-up. During this time she will  be benefiting from palliative radiotherapy to the spine by Dr. Sondra Come. The patient was advised to call immediately if she has any other concerning symptoms in the interval. The patient voices understanding of current disease status and treatment options and is in agreement with the current care plan.  All questions were answered. The patient knows to call the clinic with any problems, questions or concerns. We can certainly see the patient much sooner if necessary.  Thank you so much for allowing me to participate in the care of MAKAYLE KRAHN. I will continue  to follow up the patient with you and assist in her care.  The total time spent in the appointment was 90 minutes.  Disclaimer: This note was dictated with voice recognition software. Similar sounding words can inadvertently be transcribed and may not be corrected upon review.   Catherine Kelley June 24, 2022, 1:46 PM

## 2022-06-25 NOTE — Progress Notes (Signed)
Radiation Oncology         [phone] ________________________________  Initial Outpatient Consultation  Name: Catherine Kelley MRN: [id number]  Date: 06/26/2022  DOB: 10-06-1951  CC:Maximiano Coss, NP  Curt Bears, MD   REFERRING PHYSICIAN: Curt Bears, MD  DIAGNOSIS: There were no encounter diagnoses.  Stage 4 adenocarcinoma of the right lung and osseous metastases to the spine (likely from lung cancer primary) and nodal metastases    Cancer Staging  Adenocarcinoma of right lung, stage 4 (HCC) Staging form: Lung, AJCC 8th Edition - Clinical: Stage IV (cT3, cN3, cM1) - Signed by Curt Bears, MD on 06/24/2022  HISTORY OF PRESENT ILLNESS::Catherine Kelley is a 71 y.o. female with a 50+ pack year smoking history. She is accompanied by ***. she is seen as a courtesy of Dr. Julien Nordmann for an opinion concerning radiation therapy as part of management for her recently diagnosed right lung cancer.   The patient presented the ED on 05/34/23 for evaluation of severe constipation and a recent 10 pound weight loss. She also endorsed chronic dyspnea which prompted a CT of the chest abdomen and pelvis which showed significant interval enlargement of a subpleural mass-like consolidation of the posterior right upper lobe concerning for primary lung malignancy (increased in size when compared to a prior chest CT dated 11/08/2020). CT also showed: a new mass-like consolidation laterally in the right upper lobe; multiple additional foci of less solid appearing subpleural ground glass and consolidative airspace disease within the medial right upper lobe and subpleural left upper lobe; interval enlargement of mediastinal and bilateral hilar lymph nodes suspicious for nodal metastatic disease; severe varicoid and cystic bronchiectasis in the right greater than left bilateral lung bases; and slightly worsened associated nodular airspace disease, particularly in the left lung base consistent with chronic  sequelae of aspiration. Outside of the lungs, CT also showed densely sclerotic osseous lesions of the T11 and lower vertebral bodies suspicious for osseous metastases.   -- In regards to the bony lesions: Chest CT on 11/08/2020 noted interval development of large sclerotic densities involving what appeared to be the T12 and L1 vertebral bodies, concerning for metastatic disease  Given these findings, the patient was admitted for further management. It does not appear that the patient had any further work-up for these findings following CT performed in 2021. Whole body bone scan performed while inpatient on 04/24/22 showed abnormal increased uptake within thoracolumbar spine at T11-L3; corresponding to sclerotic foci on recent CT, and most consistent with osseous metastatic disease.   In regards to pulmonary findings, the patient was seen by pulmonology while inpatient who prescribed nebulizer treatments. At discharge, the patient was recommended outpatient evaluation of the masses and a PET scan for further evaluation. (Encounter summary noted that the patient had been previously seen by Dr. Purnell Shoemaker. Patient was also noted to have had several COPD exacerbations in the past few months).  PET scan on 05/16/22 demonstrated the progressive sclerotic osseous metastases within the thoracolumbar spine as hypermetabolic, and thus consistent with malignancy. PET also showed: progressive hypermetabolic mediastinal adenopathy, hypermetabolic activity associated with increasing peripheral lung masses / infiltrates, and severe bronchiectasis with associated peribronchial inflammatory changes. Otherwise, PET showed no evidence of soft tissue metastases in the neck, abdomen, or pelvis.  Following discharge, the patient accordingly met with Dr. Erin Fulling on 05/23/22. During this visit, the patient endorsed back pain associated with her suspected osseous metastases to the spine, for which Dr. Erin Fulling prescribed oxycodone  and acetaminophen. Moving forward, the  patient agreed to proceed to navigational bronchoscopy along with EBUS to get biopsies of lung masses and mediastinal lymph nodes. (For bronchiectasis seen on her recent CT, Dr . Erin Fulling also prescribed her an albuterol inhaler).   Super D chest CT on 06/06/22 showed no significant change of the multiple bilateral foci of subpleural, masslike pulmonary Consolidations. (Given persistence and FDG avidity on prior PET-CT, findings remained highly suspicious for malignancy, including pulmonary lymphomatous involvement). CT also showed: the enlarged mediastinal and bilateral hilar lymph nodes as similar in the interval, unchanged dense osseous sclerosis of T11, T12 and L1, and no change of the severe, varicoid and cystic bronchiectasis in the right greater than left bilateral lung bases, with extensive bronchiolar plugging and associated centrilobular and tree-in-bud nodularity also noted.   The patient opted to proceed with bronchoscopy and biopsies on 06/10/11 under the care of Dr. Lamonte Sakai. Biopsies and lavage of the RUL revealed malignant cells consistent with adenocarcinoma. FNA also performed of the station 7 and 4R lymph nodes were positive for adenocarcinoma. (PD-L1 expression was 0).   The patient's case was presented that the tumor board held on 06/20/22.   The patient was recently seen in consultation by Dr. Julien Nordmann on 06/24/22. During which time, the patient endorsed persistent back pain (for which she takes Tylenol as-needed), as well as ongoing cough and shortness of breath with exertion without hemoptysis. Moving forward, Dr. Julien Nordmann notes that he would like to wait for the results from molecular studies before discussing all the treatment options with the patient. If the patient has an actionable mutation, Dr. Julien Nordmann will consider her for treatment with targeted therapy, but if she has no actionable mutations, he will consider her for treatment with a  combination of systemic chemotherapy with carboplatin, Alimta and Keytruda followed by maintenance Alimta and Keytruda.  (Dr. Julien Nordmann has also ordered an MRI of the brain scheduled for 07/03/22).     PREVIOUS RADIATION THERAPY: No  PAST MEDICAL HISTORY:  Past Medical History:  Diagnosis Date   Anxiety    Arthritis    "joints" (09/18/2017)   Asthma    Bipolar I disorder ([place]) 10/11/2019   CAP (community acquired pneumonia) 09/18/2017   Cat allergies    Chronic bronchitis ([place])    "get it alot; maybe not q yr" (07/06/2014)   Chronic lower back pain    "spurs and pinched nerves" (09/18/2017)   COPD (chronic obstructive pulmonary disease) ([place])    DDD (degenerative disc disease), lumbar    Depression    psychiatrist Dr. Toy Care   Diastolic dysfunction    Dyspnea    Emphysematous COPD ([place])    changes in right base along with patchy areas on last x-ray   Fall as cause of accidental injury at home as place of occurrence 10/11/2019   occasional use of walker   Fall involving wheelchair as cause of accidental injury 10/11/2019   Falls frequently    "several in 2017; fell at dr's office yesterday" (09/18/2017)   GERD (gastroesophageal reflux disease)    HCAP (healthcare-associated pneumonia) 02/12/2016   Heart murmur    History of cardiovascular stress test 08/15/2004   EF of 70% -- Normal stress cardiolite.  There is no evidence of ischemia and there is normal LV function. -- [name]. MD   History of echocardiogram 12/24/2006   Est. EF of 05-69% NORMAL LV SYSTOLIC FUNCTION WITH IMPAIRED RELAXATION -- MILD AORTIC SCLEROSIS -- NORMAL PALONARY ARTERY PRESSURE -- NO OLD ECHOS FOR COMPARISON --  [name], MD   Hypertension    essential hypertension   Migraine    "when I was having my periods; none since then" (09/18/2017)   Necrotic pneumonia ([place])    recurrent/notes 02/12/2016   Pneumonia    "several times since May 2015" (07/06/2014)   Pollen allergies    Tobacco  abuse    smoking about 1/2 pk of cigarettes a day   Tobacco dependence due to cigarettes 10/11/2019   Traumatic hematoma of knee, right, initial encounter 10/11/2019    PAST SURGICAL HISTORY: Past Surgical History:  Procedure Laterality Date   BIOPSY  04/28/2022   Procedure: BIOPSY;  Surgeon: Carol Ada, MD;  Location: [facility];  Service: Gastroenterology;;   BRONCHIAL BIOPSY  06/09/2022   Procedure: BRONCHIAL BIOPSIES;  Surgeon: [name], MD;  Location: [facility] ENDOSCOPY;  Service: Pulmonary;;   BRONCHIAL BRUSHINGS  06/09/2022   Procedure: BRONCHIAL BRUSHINGS;  Surgeon: [name], MD;  Location: [facility] ENDOSCOPY;  Service: Pulmonary;;   BRONCHIAL NEEDLE ASPIRATION BIOPSY  06/09/2022   Procedure: BRONCHIAL NEEDLE ASPIRATION BIOPSIES;  Surgeon: [name], MD;  Location: [facility] ENDOSCOPY;  Service: Pulmonary;;   BRONCHIAL WASHINGS  06/09/2022   Procedure: BRONCHIAL WASHINGS;  Surgeon: [name], MD;  Location: [facility] ENDOSCOPY;  Service: Pulmonary;;   CAROTID STENT     COLONOSCOPY N/A 04/28/2022   Procedure: COLONOSCOPY;  Surgeon: Carol Ada, MD;  Location: [facility] ENDOSCOPY;  Service: Gastroenterology;  Laterality: N/A;   DILATION AND CURETTAGE OF UTERUS     FOREIGN BODY REMOVAL  04/28/2022   Procedure: FOREIGN BODY REMOVAL;  Surgeon: Carol Ada, MD;  Location: [facility] ENDOSCOPY;  Service: Gastroenterology;;  Fecal Impaction Removed   TUBAL LIGATION  1986   VIDEO BRONCHOSCOPY WITH ENDOBRONCHIAL ULTRASOUND N/A 06/09/2022   Procedure: VIDEO BRONCHOSCOPY WITH ENDOBRONCHIAL ULTRASOUND;  Surgeon: [name], MD;  Location: [facility] ENDOSCOPY;  Service: Pulmonary;  Laterality: N/A;   VIDEO BRONCHOSCOPY WITH RADIAL ENDOBRONCHIAL ULTRASOUND  06/09/2022   Procedure: VIDEO BRONCHOSCOPY WITH RADIAL ENDOBRONCHIAL ULTRASOUND;  Surgeon: [name], MD;  Location: [facility] ENDOSCOPY;  Service: Pulmonary;;    FAMILY HISTORY:  Family History  Problem Relation Age of Onset   Stroke Mother    Heart  disease Father    Hyperlipidemia Father    Hypertension Father    Breast cancer Maternal Aunt    Asthma Maternal Grandmother     SOCIAL HISTORY:  Social History   Tobacco Use   Smoking status: Every Day    Packs/day: 0.75    Years: 46.00    Total pack years: 34.50    Types: Cigarettes   Smokeless tobacco: Never  Vaping Use   Vaping Use: Never used  Substance Use Topics   Alcohol use: Yes    Alcohol/week: 0.0 standard drinks of alcohol    Comment: 09/18/2017 "might have 1 drink/month; if that"   Drug use: No    ALLERGIES:  Allergies  Allergen Reactions   Sulfa Drugs Cross Reactors Hives and Rash   Ceclor [Cefaclor] Hives    Tolerated ceftazidime December 2016   Depakote Er [Divalproex Sodium Er] Other (See Comments)    Patient remarked that when she was taking this TWICE a day, she became "clumsy" and felt more prone to stumbling   Escitalopram Oxalate Other (See Comments)    Pt does not recall ever taking medication (??)   Latex Itching   Other Other (See Comments)    Hydrocort Cream  Unknown   Sertraline Hcl  Other (See Comments)    Severe headaches    Tape Itching and Rash    Band-Aids, also (PAPER TAPE IS TOLERATED)    MEDICATIONS:  Current Outpatient Medications  Medication Sig Dispense Refill   albuterol (VENTOLIN HFA) 108 (90 Base) MCG/ACT inhaler Inhale 2 puffs into the lungs every 6 (six) hours as needed for shortness of breath. 1 each 2   amLODipine (NORVASC) 2.5 MG tablet Take 1 tablet (2.5 mg total) by mouth daily. 30 tablet 1   cetirizine (ZYRTEC) 10 MG tablet Take 1 tablet (10 mg total) by mouth daily. 30 tablet 11   folic acid (FOLVITE) 1 MG tablet Take 1 tablet (1 mg total) by mouth daily. 30 tablet 2   lithium carbonate (LITHOBID) 300 MG CR tablet Take 600 mg in am and 300 at bed time. 270 tablet 0   nortriptyline (PAMELOR) 50 MG capsule Take 1 capsule (50 mg total) by mouth at bedtime. 90 capsule 0   oxyCODONE-acetaminophen (PERCOCET/ROXICET)  5-325 MG tablet Take 1 tablet by mouth every 8 (eight) hours as needed for severe pain. 15 tablet 0   polyethylene glycol (MIRALAX / GLYCOLAX) 17 g packet Take 17 g by mouth daily. 30 each 2   senna (SENOKOT) 8.6 MG TABS tablet Take 2 tablets (17.2 mg total) by mouth 2 (two) times daily. 120 tablet 0   spironolactone (ALDACTONE) 50 MG tablet TAKE 1 TABLET BY MOUTH EVERY DAY 90 tablet 1   thiamine 100 MG tablet Take 1 tablet (100 mg total) by mouth daily. 30 tablet 2   venlafaxine (EFFEXOR) 75 MG tablet Take 1 tablet (75 mg total) by mouth daily. 90 tablet 0   Vitamin D, Ergocalciferol, (DRISDOL) 1.25 MG (50000 UNIT) CAPS capsule TAKE 1 CAPSULE (50,000 UNITS TOTAL) BY MOUTH EVERY 7 (SEVEN) DAYS 12 capsule 0   No current facility-administered medications for this encounter.    REVIEW OF SYSTEMS:  A 10+ POINT REVIEW OF SYSTEMS WAS OBTAINED including neurology, dermatology, psychiatry, cardiac, respiratory, lymph, extremities, GI, GU, musculoskeletal, constitutional, reproductive, HEENT. ***   PHYSICAL EXAM:  vitals were not taken for this visit.   General: Alert and oriented, in no acute distress HEENT: Head is normocephalic. Extraocular movements are intact. Oropharynx is clear. Neck: Neck is supple, no palpable cervical or supraclavicular lymphadenopathy. Heart: Regular in rate and rhythm with no murmurs, rubs, or gallops. Chest: Clear to auscultation bilaterally, with no rhonchi, wheezes, or rales. Abdomen: Soft, nontender, nondistended, with no rigidity or guarding. Extremities: No cyanosis or edema. Lymphatics: see Neck Exam Skin: No concerning lesions. Musculoskeletal: symmetric strength and muscle tone throughout. Neurologic: Cranial nerves II through XII are grossly intact. No obvious focalities. Speech is fluent. Coordination is intact. Psychiatric: Judgment and insight are intact. Affect is appropriate. ***  ECOG = ***  0 - Asymptomatic (Fully active, able to carry on all  predisease activities without restriction)  1 - Symptomatic but completely ambulatory (Restricted in physically strenuous activity but ambulatory and able to carry out work of a light or sedentary nature. For example, light housework, office work)  2 - Symptomatic, <50% in bed during the day (Ambulatory and capable of all self care but unable to carry out any work activities. Up and about more than 50% of waking hours)  3 - Symptomatic, >50% in bed, but not bedbound (Capable of only limited self-care, confined to bed or chair 50% or more of waking hours)  4 - Bedbound (Completely disabled. Cannot carry on any self-care. Totally  confined to bed or chair)  5 - Death   Eustace Pen MM, Creech RH, Tormey DC, et al. [phone]). "Toxicity and response criteria of the [facility]". [place] Oncol. 5 (6): 649-55  LABORATORY DATA:  Lab Results  Component Value Date   WBC 7.7 06/24/2022   HGB 12.9 06/24/2022   HCT 39.3 06/24/2022   MCV 87.9 06/24/2022   PLT 156 06/24/2022   NEUTROABS 6.2 06/24/2022   Lab Results  Component Value Date   NA 131 (L) 06/24/2022   K 3.9 06/24/2022   CL 102 06/24/2022   CO2 25 06/24/2022   GLUCOSE 90 06/24/2022   BUN 9 06/24/2022   CREATININE 0.40 (L) 06/24/2022   CALCIUM 9.4 06/24/2022      RADIOGRAPHY: DG Chest Port 1 View  Result Date: 06/09/2022 CLINICAL DATA:  Status post bronchoscopy with biopsy. EXAM: PORTABLE CHEST 1 VIEW COMPARISON:  Chest x-ray dated 04/27/2022. FINDINGS: Heart size and mediastinal contours are stable. Patchy bilateral airspace opacities and coarse lung markings throughout both lungs are unchanged in the short-term interval. No pleural effusion or pneumothorax is seen. IMPRESSION: No pneumothorax, hemothorax or other complicating features seen status post bronchoscopy with biopsies. Electronically Signed   By: Franki Cabot M.D.   On: 06/09/2022 10:20   DG C-ARM BRONCHOSCOPY  Result Date: 06/09/2022 C-ARM  BRONCHOSCOPY: Fluoroscopy was utilized by the requesting physician.  No radiographic interpretation.   CT Super D Chest Wo Contrast  Result Date: 06/08/2022 CLINICAL DATA:  Lung mass * Tracking Code: BO * EXAM: CT CHEST WITHOUT CONTRAST TECHNIQUE: Multidetector CT imaging of the chest was performed using thin slice collimation for electromagnetic bronchoscopy planning purposes, without intravenous contrast. RADIATION DOSE REDUCTION: This exam was performed according to the departmental dose-optimization program which includes automated exposure control, adjustment of the mA and/or kV according to patient size and/or use of iterative reconstruction technique. COMPARISON:  PET-CT, 05/16/2022, CT chest, 04/24/2022 FINDINGS: Cardiovascular: Aortic atherosclerosis. Normal heart size. Three-vessel coronary artery calcifications. No pericardial effusion. Mediastinum/Nodes: Similar enlarged mediastinal and bilateral hilar lymph nodes, pretracheal nodes measuring up to 1.5 x 1.2 cm (series 2, image 24). Thyroid gland, trachea, and esophagus demonstrate no significant findings. Lungs/Pleura: Multiple bilateral foci of subpleural, masslike consolidation are not significantly changed, for example in the posterior right upper lobe measuring 3.5 x 2.1 cm (series 7, image 59) and in the peripheral right upper lobe measuring 4.3 x 1.9 cm (series 7, image 67). Severe, varicoid and cystic bronchiectasis in the right greater than left bilateral lung bases with extensive bronchiolar plugging and associated centrilobular and tree-in-bud nodularity, unchanged (series 7, image 109). Severe underlying centrilobular and paraseptal emphysema. Diffuse bilateral bronchial wall thickening. No pleural effusion or pneumothorax. Upper Abdomen: No acute abnormality.  Partially imaged splenomegaly. Musculoskeletal: No chest wall abnormality. Unchanged dense osseous sclerosis of T11, T12 and L1 (series 4, image 86) IMPRESSION: 1. Multiple  bilateral foci of subpleural, masslike pulmonary consolidation are not significantly changed, and given persistence and FDG avidity on prior PET-CT remain highly suspicious for malignancy, including pulmonary lymphomatous involvement. 2. Similar enlarged mediastinal and bilateral hilar lymph nodes, as above previously PET avid. 3. Unchanged dense osseous sclerosis of T11, T12 and L1, as above previously PET avid. 4. Severe, varicoid and cystic bronchiectasis in the right greater than left bilateral lung bases with extensive bronchiolar plugging and associated centrilobular and tree-in-bud nodularity, unchanged, consistent with chronic sequelae of atypical infection or aspiration, likely ongoing. 5. Severe emphysema. 6. Coronary artery  disease. 7. Partially imaged splenomegaly. Aortic Atherosclerosis (ICD10-I70.0) and Emphysema (ICD10-J43.9). Electronically Signed   By: Delanna Ahmadi M.D.   On: 06/08/2022 13:48      IMPRESSION: Stage 4 adenocarcinoma of the right lung and osseous metastases to the spine (likely from lung cancer primary) and nodal metastases   ***  Today, I talked to the patient and family about the findings and work-up thus far.  We discussed the natural history of *** and general treatment, highlighting the role of radiotherapy in the management.  We discussed the available radiation techniques, and focused on the details of logistics and delivery.  We reviewed the anticipated acute and late sequelae associated with radiation in this setting.  The patient was encouraged to ask questions that I answered to the best of my ability. *** A patient consent form was discussed and signed.  We retained a copy for our records.  The patient would like to proceed with radiation and will be scheduled for CT simulation.  PLAN: ***    *** minutes of total time was spent for this patient encounter, including preparation, face-to-face counseling with the patient and coordination of care, physical exam,  and documentation of the encounter.   ------------------------------------------------  Blair Promise, PhD, MD  This document serves as a record of services personally performed by Gery Pray, MD. It was created on his behalf by Roney Mans, a trained medical scribe. The creation of this record is based on the scribe's personal observations and the provider's statements to them. This document has been checked and approved by the attending provider.

## 2022-06-26 ENCOUNTER — Encounter (HOSPITAL_COMMUNITY): Payer: Self-pay

## 2022-06-26 ENCOUNTER — Other Ambulatory Visit: Payer: Self-pay

## 2022-06-26 ENCOUNTER — Ambulatory Visit
Admission: RE | Admit: 2022-06-26 | Discharge: 2022-06-26 | Disposition: A | Discharge status: Home / Self Care | Payer: Medicare PPO | Source: Ambulatory Visit | Attending: Radiation Oncology | Admitting: Radiation Oncology

## 2022-06-26 ENCOUNTER — Encounter: Payer: Self-pay | Admitting: Radiation Oncology

## 2022-06-26 VITALS — BP 107/46 | HR 79 | Temp 98.1°F | Resp 20 | Ht 63.0 in

## 2022-06-26 DIAGNOSIS — J479 Bronchiectasis, uncomplicated: Secondary | ICD-10-CM | POA: Diagnosis not present

## 2022-06-26 DIAGNOSIS — K219 Gastro-esophageal reflux disease without esophagitis: Secondary | ICD-10-CM | POA: Diagnosis not present

## 2022-06-26 DIAGNOSIS — I10 Essential (primary) hypertension: Secondary | ICD-10-CM | POA: Diagnosis not present

## 2022-06-26 DIAGNOSIS — C3491 Malignant neoplasm of unspecified part of right bronchus or lung: Secondary | ICD-10-CM | POA: Diagnosis not present

## 2022-06-26 DIAGNOSIS — K5900 Constipation, unspecified: Secondary | ICD-10-CM | POA: Insufficient documentation

## 2022-06-26 DIAGNOSIS — I2510 Atherosclerotic heart disease of native coronary artery without angina pectoris: Secondary | ICD-10-CM | POA: Insufficient documentation

## 2022-06-26 DIAGNOSIS — M5136 Other intervertebral disc degeneration, lumbar region: Secondary | ICD-10-CM | POA: Diagnosis not present

## 2022-06-26 DIAGNOSIS — F319 Bipolar disorder, unspecified: Secondary | ICD-10-CM | POA: Diagnosis not present

## 2022-06-26 DIAGNOSIS — C7951 Secondary malignant neoplasm of bone: Secondary | ICD-10-CM | POA: Insufficient documentation

## 2022-06-26 DIAGNOSIS — F17210 Nicotine dependence, cigarettes, uncomplicated: Secondary | ICD-10-CM | POA: Diagnosis not present

## 2022-06-26 DIAGNOSIS — Z803 Family history of malignant neoplasm of breast: Secondary | ICD-10-CM | POA: Diagnosis not present

## 2022-06-26 DIAGNOSIS — C3492 Malignant neoplasm of unspecified part of left bronchus or lung: Secondary | ICD-10-CM | POA: Diagnosis not present

## 2022-06-26 DIAGNOSIS — G8929 Other chronic pain: Secondary | ICD-10-CM | POA: Diagnosis not present

## 2022-06-26 DIAGNOSIS — C779 Secondary and unspecified malignant neoplasm of lymph node, unspecified: Secondary | ICD-10-CM | POA: Diagnosis not present

## 2022-06-26 DIAGNOSIS — Z79899 Other long term (current) drug therapy: Secondary | ICD-10-CM | POA: Diagnosis not present

## 2022-06-27 ENCOUNTER — Encounter (HOSPITAL_COMMUNITY): Payer: Self-pay | Admitting: Internal Medicine

## 2022-06-27 ENCOUNTER — Ambulatory Visit
Admission: RE | Admit: 2022-06-27 | Discharge: 2022-06-27 | Disposition: A | Discharge status: Home / Self Care | Payer: Medicare PPO | Source: Ambulatory Visit | Attending: Radiation Oncology | Admitting: Radiation Oncology

## 2022-06-27 ENCOUNTER — Encounter (HOSPITAL_COMMUNITY): Payer: Self-pay

## 2022-06-27 DIAGNOSIS — Z510 Encounter for antineoplastic radiation therapy: Secondary | ICD-10-CM | POA: Diagnosis not present

## 2022-06-27 DIAGNOSIS — F17210 Nicotine dependence, cigarettes, uncomplicated: Secondary | ICD-10-CM | POA: Diagnosis not present

## 2022-06-27 DIAGNOSIS — C7951 Secondary malignant neoplasm of bone: Secondary | ICD-10-CM | POA: Diagnosis not present

## 2022-06-27 DIAGNOSIS — C3491 Malignant neoplasm of unspecified part of right bronchus or lung: Secondary | ICD-10-CM | POA: Diagnosis not present

## 2022-06-27 DIAGNOSIS — C779 Secondary and unspecified malignant neoplasm of lymph node, unspecified: Secondary | ICD-10-CM | POA: Diagnosis not present

## 2022-07-01 DIAGNOSIS — C779 Secondary and unspecified malignant neoplasm of lymph node, unspecified: Secondary | ICD-10-CM | POA: Diagnosis not present

## 2022-07-01 DIAGNOSIS — M899 Disorder of bone, unspecified: Secondary | ICD-10-CM | POA: Diagnosis not present

## 2022-07-01 DIAGNOSIS — C3491 Malignant neoplasm of unspecified part of right bronchus or lung: Secondary | ICD-10-CM | POA: Diagnosis not present

## 2022-07-01 DIAGNOSIS — F17210 Nicotine dependence, cigarettes, uncomplicated: Secondary | ICD-10-CM | POA: Diagnosis not present

## 2022-07-01 DIAGNOSIS — C7951 Secondary malignant neoplasm of bone: Secondary | ICD-10-CM | POA: Diagnosis not present

## 2022-07-02 ENCOUNTER — Other Ambulatory Visit: Payer: Self-pay

## 2022-07-02 ENCOUNTER — Ambulatory Visit
Admission: RE | Admit: 2022-07-02 | Discharge: 2022-07-02 | Disposition: A | Discharge status: Home / Self Care | Payer: Medicare PPO | Source: Ambulatory Visit | Attending: Radiation Oncology | Admitting: Radiation Oncology

## 2022-07-02 DIAGNOSIS — F17210 Nicotine dependence, cigarettes, uncomplicated: Secondary | ICD-10-CM | POA: Diagnosis not present

## 2022-07-02 DIAGNOSIS — C3491 Malignant neoplasm of unspecified part of right bronchus or lung: Secondary | ICD-10-CM | POA: Diagnosis not present

## 2022-07-02 DIAGNOSIS — C7951 Secondary malignant neoplasm of bone: Secondary | ICD-10-CM | POA: Diagnosis not present

## 2022-07-02 DIAGNOSIS — M899 Disorder of bone, unspecified: Secondary | ICD-10-CM | POA: Diagnosis not present

## 2022-07-02 DIAGNOSIS — Z510 Encounter for antineoplastic radiation therapy: Secondary | ICD-10-CM | POA: Diagnosis not present

## 2022-07-02 DIAGNOSIS — C779 Secondary and unspecified malignant neoplasm of lymph node, unspecified: Secondary | ICD-10-CM | POA: Diagnosis not present

## 2022-07-02 LAB — RAD ONC ARIA SESSION SUMMARY
Course Elapsed Days: 0
Plan Fractions Treated to Date: 1
Plan Prescribed Dose Per Fraction: 2.5 Gy
Plan Total Fractions Prescribed: 14
Plan Total Prescribed Dose: 35 Gy
Reference Point Dosage Given to Date: 2.5 Gy
Reference Point Session Dosage Given: 2.5 Gy
Session Number: 1

## 2022-07-02 NOTE — Progress Notes (Signed)
[facility] OFFICE PROGRESS NOTE  Maximiano Coss, NP No address on file  DIAGNOSIS: stage IV (T3, N3, M1c) non-small cell lung cancer, adenocarcinoma presented with multiple pulmonary nodules in the right lung in addition to left upper lobe lung nodule, mediastinal and bilateral hilar lymphadenopathy as well as multiple metastatic bone disease diagnosed in July 2023.  PD-L1 expression is 0%  Molecular studies: Negative for any actionable mutations   PRIOR THERAPY: Palliative radiation to the metastatic bone lesions under the care of Dr. Sondra Come.  Last day radiation scheduled for 07/21/22  CURRENT THERAPY: Palliative systemic chemotherapy with carboplatin for an AUC of 5, Alimta 500 mg per metered square, Keytruda 200 mg IV every 3 weeks.  First dose expected on 07/14/2022.  INTERVAL HISTORY: Catherine Kelley 71 y.o. female returns to the clinic today for a follow-up visit accompanied by her husband. The patient was last seen in the clinic on 06/24/2022.  The patient is currently undergoing palliative radiation to the painful metastatic bone lesions under the care of Dr. Sondra Come.  Her last day radiation is tentatively scheduled for 07/21/2022.  She is currently taking Tylenol for her back pain. She has not had any appreciable improvement in her symptoms. While the patient is undergoing palliative radiation, the patient had molecular studies that were still pending. This was important to determine the best treatment option for her.  Her molecular studies did not show any actionable mutations.  She also needed to complete the staging work-up with a brain MRI.  Her brain MRI showed 4 tiny areas of enhancement which favors subacute infarcts over metastatic disease.  The patient denies any speech changes, dexterity changes, falls, headaches, or vision changes.  Since last being seen, the patient denies any new changes with her health.  Denies any fever, chills, or night sweats.  She continues to have  a poor appetite.  They would be interested in seeing a member the nutritionist team.  She is not drinking any supplemental drinks at this time.  She continues to have stable dyspnea on exertion and cough without any hemoptysis or chest pain.  She uses albuterol if needed for shortness of breath.  She had 1 episode of nausea and vomiting when she for started radiation.  This has since subsided.  She is also on a bowel regimen to prevent constipation.  She takes MiraLAX and senna. She is here today for evaluation and more detailed discussion about her current condition and recommended treatment options.   MEDICAL HISTORY: Past Medical History:  Diagnosis Date   Anxiety    Arthritis    "joints" (09/18/2017)   Asthma    Bipolar I disorder ([place]) 10/11/2019   CAP (community acquired pneumonia) 09/18/2017   Cat allergies    Chronic bronchitis ([place])    "get it alot; maybe not q yr" (07/06/2014)   Chronic lower back pain    "spurs and pinched nerves" (09/18/2017)   COPD (chronic obstructive pulmonary disease) ([place])    DDD (degenerative disc disease), lumbar    Depression    psychiatrist Dr. Toy Care   Diastolic dysfunction    Dyspnea    Emphysematous COPD ([place])    changes in right base along with patchy areas on last x-ray   Fall as cause of accidental injury at home as place of occurrence 10/11/2019   occasional use of walker   Fall involving wheelchair as cause of accidental injury 10/11/2019   Falls frequently    "several in 2017; fell at  dr's office yesterday" (09/18/2017)   GERD (gastroesophageal reflux disease)    HCAP (healthcare-associated pneumonia) 02/12/2016   Heart murmur    History of cardiovascular stress test 08/15/2004   EF of 70% -- Normal stress cardiolite.  There is no evidence of ischemia and there is normal LV function. -- [name]. MD   History of echocardiogram 12/24/2006   Est. EF of 70-48% NORMAL LV SYSTOLIC FUNCTION WITH IMPAIRED RELAXATION -- MILD AORTIC  SCLEROSIS -- NORMAL PALONARY ARTERY PRESSURE -- NO OLD ECHOS FOR COMPARISON -- [name], MD   Hypertension    essential hypertension   Migraine    "when I was having my periods; none since then" (09/18/2017)   Necrotic pneumonia ([place])    recurrent/notes 02/12/2016   Pneumonia    "several times since May 2015" (07/06/2014)   Pollen allergies    Tobacco abuse    smoking about 1/2 pk of cigarettes a day   Tobacco dependence due to cigarettes 10/11/2019   Traumatic hematoma of knee, right, initial encounter 10/11/2019    ALLERGIES:  is allergic to sulfa drugs cross reactors, ceclor [cefaclor], depakote er [divalproex sodium er], escitalopram oxalate, latex, other, sertraline hcl, and tape.  MEDICATIONS:  Current Outpatient Medications  Medication Sig Dispense Refill   folic acid (FOLVITE) 1 MG tablet Take 1 tablet (1 mg total) by mouth daily. 30 tablet 2   prochlorperazine (COMPAZINE) 10 MG tablet Take 1 tablet (10 mg total) by mouth every 6 (six) hours as needed. 30 tablet 2   albuterol (VENTOLIN HFA) 108 (90 Base) MCG/ACT inhaler Inhale 2 puffs into the lungs every 6 (six) hours as needed for shortness of breath. 1 each 2   amLODipine (NORVASC) 2.5 MG tablet Take 1 tablet (2.5 mg total) by mouth daily. 30 tablet 1   cetirizine (ZYRTEC) 10 MG tablet Take 1 tablet (10 mg total) by mouth daily. (Patient not taking: Reported on 06/26/2022) 30 tablet 11   folic acid (FOLVITE) 1 MG tablet Take 1 tablet (1 mg total) by mouth daily. 30 tablet 2   lithium carbonate (LITHOBID) 300 MG CR tablet Take 600 mg in am and 300 at bed time. 270 tablet 0   nortriptyline (PAMELOR) 50 MG capsule Take 1 capsule (50 mg total) by mouth at bedtime. 90 capsule 0   oxyCODONE-acetaminophen (PERCOCET/ROXICET) 5-325 MG tablet Take 1 tablet by mouth every 8 (eight) hours as needed for severe pain. 30 tablet 0   polyethylene glycol (MIRALAX / GLYCOLAX) 17 g packet Take 17 g by mouth daily. 30 each 2   senna  (SENOKOT) 8.6 MG TABS tablet Take 2 tablets (17.2 mg total) by mouth 2 (two) times daily. 120 tablet 0   spironolactone (ALDACTONE) 50 MG tablet TAKE 1 TABLET BY MOUTH EVERY DAY (Patient not taking: Reported on 06/26/2022) 90 tablet 1   thiamine 100 MG tablet Take 1 tablet (100 mg total) by mouth daily. 30 tablet 2   venlafaxine (EFFEXOR) 75 MG tablet Take 1 tablet (75 mg total) by mouth daily. 90 tablet 0   Vitamin D, Ergocalciferol, (DRISDOL) 1.25 MG (50000 UNIT) CAPS capsule TAKE 1 CAPSULE (50,000 UNITS TOTAL) BY MOUTH EVERY 7 (SEVEN) DAYS 12 capsule 0   No current facility-administered medications for this visit.    SURGICAL HISTORY:  Past Surgical History:  Procedure Laterality Date   BIOPSY  04/28/2022   Procedure: BIOPSY;  Surgeon: Carol Ada, MD;  Location: [facility] ENDOSCOPY;  Service: Gastroenterology;;   BRONCHIAL BIOPSY  06/09/2022  Procedure: BRONCHIAL BIOPSIES;  Surgeon: [name], MD;  Location: [facility] ENDOSCOPY;  Service: Pulmonary;;   BRONCHIAL BRUSHINGS  06/09/2022   Procedure: BRONCHIAL BRUSHINGS;  Surgeon: [name], MD;  Location: [facility] ENDOSCOPY;  Service: Pulmonary;;   BRONCHIAL NEEDLE ASPIRATION BIOPSY  06/09/2022   Procedure: BRONCHIAL NEEDLE ASPIRATION BIOPSIES;  Surgeon: [name], MD;  Location: [facility] ENDOSCOPY;  Service: Pulmonary;;   BRONCHIAL WASHINGS  06/09/2022   Procedure: BRONCHIAL WASHINGS;  Surgeon: [name], MD;  Location: [facility] ENDOSCOPY;  Service: Pulmonary;;   CAROTID STENT     COLONOSCOPY N/A 04/28/2022   Procedure: COLONOSCOPY;  Surgeon: Carol Ada, MD;  Location: [facility] ENDOSCOPY;  Service: Gastroenterology;  Laterality: N/A;   DILATION AND CURETTAGE OF UTERUS     FOREIGN BODY REMOVAL  04/28/2022   Procedure: FOREIGN BODY REMOVAL;  Surgeon: Carol Ada, MD;  Location: [facility] ENDOSCOPY;  Service: Gastroenterology;;  Fecal Impaction Removed   TUBAL LIGATION  1986   VIDEO BRONCHOSCOPY WITH ENDOBRONCHIAL ULTRASOUND N/A 06/09/2022   Procedure: VIDEO  BRONCHOSCOPY WITH ENDOBRONCHIAL ULTRASOUND;  Surgeon: [name], MD;  Location: [facility] ENDOSCOPY;  Service: Pulmonary;  Laterality: N/A;   VIDEO BRONCHOSCOPY WITH RADIAL ENDOBRONCHIAL ULTRASOUND  06/09/2022   Procedure: VIDEO BRONCHOSCOPY WITH RADIAL ENDOBRONCHIAL ULTRASOUND;  Surgeon: [name], MD;  Location: [facility] ENDOSCOPY;  Service: Pulmonary;;    REVIEW OF SYSTEMS:   Review of Systems  Constitutional: Positive for fatigue, decreased appetite, and weight loss.  Negative for chills and fever.  HENT: Negative for mouth sores, nosebleeds, sore throat and trouble swallowing.   Eyes: Negative for eye problems and icterus.  Respiratory: Positive for stable dyspnea and cough.  Negative for hemoptysis, and wheezing.   Cardiovascular: Negative for chest pain and leg swelling.  Gastrointestinal: Negative for abdominal pain, constipation (none at this time), diarrhea, nausea and vomiting (none at this time).  Genitourinary: Negative for bladder incontinence, difficulty urinating, dysuria, frequency and hematuria.   Musculoskeletal: Positive for back pain.  Negative for gait problem, neck pain and neck stiffness.  Skin: Negative for itching and rash.  Neurological: Negative for dizziness, extremity weakness, gait problem, headaches, light-headedness and seizures.  Hematological: Negative for adenopathy. Does not bruise/bleed easily.  Psychiatric/Behavioral: Negative for confusion, depression and sleep disturbance. The patient is not nervous/anxious.     PHYSICAL EXAMINATION:  Pulse 85, temperature 97.7 F (36.5 C), resp. rate 18, SpO2 96 %.  ECOG PERFORMANCE STATUS: 2  Physical Exam  Constitutional: Oriented to person, place, and time and thin appearing female.  No distress.  HENT:  Head: Normocephalic and atraumatic.  Mouth/Throat: Oropharynx is clear and moist. No oropharyngeal exudate.  Eyes: Conjunctivae are normal. Right eye exhibits no discharge. Left eye exhibits no discharge. No  scleral icterus.  Neck: Normal range of motion. Neck supple.  Cardiovascular: Normal rate, regular rhythm, normal heart sounds and intact distal pulses.   Pulmonary/Chest: Effort normal.  Some rhonchi noted bilaterally right worse than left.  No respiratory distress. No wheezes. No rales.  Abdominal: Soft. Bowel sounds are normal. Exhibits no distension and no mass. There is no tenderness.  Musculoskeletal: Normal range of motion. Exhibits no edema.  Lymphadenopathy:    No cervical adenopathy.  Neurological: Alert and oriented to person, place, and time.  Exhibits muscle weakness.  Skin: Skin is warm and dry. No rash noted. Not diaphoretic. No erythema. No pallor.  Psychiatric: Mood, memory and judgment normal.  Vitals reviewed.  LABORATORY DATA: Lab Results  Component Value Date  WBC 7.5 07/08/2022   HGB 12.8 07/08/2022   HCT 38.9 07/08/2022   MCV 87.4 07/08/2022   PLT 156 07/08/2022      Chemistry      Component Value Date/Time   NA 131 (L) 07/08/2022 1349   NA 136 07/06/2020 1213   K 3.7 07/08/2022 1349   CL 103 07/08/2022 1349   CO2 24 07/08/2022 1349   BUN 8 07/08/2022 1349   BUN 7 (L) 07/06/2020 1213   CREATININE 0.36 (L) 07/08/2022 1349   CREATININE 0.45 (L) 06/27/2016 1451      Component Value Date/Time   CALCIUM 9.1 07/08/2022 1349   ALKPHOS 214 (H) 07/08/2022 1349   AST 18 07/08/2022 1349   ALT 13 07/08/2022 1349   BILITOT 0.4 07/08/2022 1349       RADIOGRAPHIC STUDIES:  MR BRAIN W WO CONTRAST  Result Date: 07/03/2022 CLINICAL DATA:  Lung cancer staging EXAM: MRI HEAD WITHOUT AND WITH CONTRAST TECHNIQUE: Multiplanar, multiecho pulse sequences of the brain and surrounding structures were obtained without and with intravenous contrast. CONTRAST:  68m GADAVIST GADOBUTROL 1 MMOL/ML IV SOLN COMPARISON:  11/08/2020 FINDINGS: Brain: 4 tiny areas of enhancement along the cortex of the left parietal and occipital lobe. No abnormal enhancing areas elsewhere in the  brain. Brain atrophy with asymmetric involvement of the right cerebellum from uncertain cause. Mild chronic small vessel ischemia. No hydrocephalus or collection Vascular: Major flow voids and vascular enhancements are preserved Skull and upper cervical spine: Normal marrow signal Sinuses/Orbits: Partial right mastoid opacification. IMPRESSION: Four tiny areas of cortically based enhancement in the posterior left cerebrum. The clustered and purely cortical appearance favors subacute infarcts over metastatic disease, recommend enhanced brain MRI follow-up in 6 weeks to differentiate. Electronically Signed   By: JJorje GuildM.D.   On: 07/03/2022 16:01   DG Chest Port 1 View  Result Date: 06/09/2022 CLINICAL DATA:  Status post bronchoscopy with biopsy. EXAM: PORTABLE CHEST 1 VIEW COMPARISON:  Chest x-ray dated 04/27/2022. FINDINGS: Heart size and mediastinal contours are stable. Patchy bilateral airspace opacities and coarse lung markings throughout both lungs are unchanged in the short-term interval. No pleural effusion or pneumothorax is seen. IMPRESSION: No pneumothorax, hemothorax or other complicating features seen status post bronchoscopy with biopsies. Electronically Signed   By: SFranki CabotM.D.   On: 06/09/2022 10:20   DG C-ARM BRONCHOSCOPY  Result Date: 06/09/2022 C-ARM BRONCHOSCOPY: Fluoroscopy was utilized by the requesting physician.  No radiographic interpretation.     ASSESSMENT/PLAN:  This is a very pleasant 71years old Caucasian female recently diagnosed with stage IV (T3, N3, M1c) non-small cell lung cancer, adenocarcinoma presented with multiple pulmonary nodules in the right lung in addition to left upper lobe lung nodule, mediastinal and bilateral hilar lymphadenopathy as well as multiple metastatic bone disease diagnosed in July 2023. PD-L1 expression is 0%.  Molecular studies showed no actionable mutations.  The patient is currently undergoing palliative radiation to the  metastatic bone lesions under the care of Dr. KSondra Come  The last day radiation is scheduled for 07/21/2022.  The patient's brain MRI showed 4 tiny areas of enhancement which is favored to represent subacute infarcts versus metastatic disease.  We will follow-up with a another brain MRI in 6 weeks to differentiate.  Dr. MJulien Nordmannhad a lengthly discussion with the patient today about her current condition and treatment options. The patient was given the option of a referral to hospice/palliative vs. Treatment with systemic chemotherapy with carboplatin for an  AUC of 5, Alimta 500 mg/m, and Keytruda 200 mg IV every 3 weeks.  The patient is interested in proceeding with systemic chemotherapy. She would like to start the week after radiation to give her a break.  She is expected to start her first dose of this treatment on 07/28/22  We discussed the adverse side effects of treatment including but not limited to alopecia, myelosuppression, nausea and vomiting, peripheral neuropathy, liver or renal dysfunction as well as immunotherapy mediated adverse effects.   I will arrange for the patient to have a chemoeducation class prior to receiving her first cycle of chemotherapy.   We will arrange for the patient to have a B12 injection while in the clinic today.     I sent prescriptions for 1 mg folic acid p.o. daily as well as Compazine 10 mg every 6 hours as needed for nausea.   The patient will follow-up in 2 weeks for a one-week follow-up visit after completing her first cycle of chemotherapy.  We will refill her oxycodone for pain control. We will also refer her to palliative care.   We will order a repeat brain MRI in 6 weeks to follow up on the indeterminate brain lesions which are favored to represent subacute stroke.   The patient was advised to call immediately if she has any concerning symptoms in the interval. The patient voices understanding of current disease status and treatment options and is  in agreement with the current care plan. All questions were answered. The patient knows to call the clinic with any problems, questions or concerns. We can certainly see the patient much sooner if necessary      Orders Placed This Encounter  Procedures   MR Brain W Wo Contrast    Standing Status:   Future    Standing Expiration Date:   07/08/2023    Order Specific Question:   If indicated for the ordered procedure, I authorize the administration of contrast media per Radiology protocol    Answer:   Yes    Order Specific Question:   What is the patient's sedation requirement?    Answer:   No Sedation    Order Specific Question:   Does the patient have a pacemaker or implanted devices?    Answer:   No    Order Specific Question:   Use SRS Protocol?    Answer:   No    Order Specific Question:   Preferred imaging location?    Answer:   [facility] (table limit - 550 lbs)   CBC with Differential ([place] Only)    Standing Status:   Standing    Number of Occurrences:   12    Standing Expiration Date:   07/09/2023   TSH    Standing Status:   Standing    Number of Occurrences:   4    Standing Expiration Date:   07/09/2023   CMP ([place] only)    Standing Status:   Standing    Number of Occurrences:   12    Standing Expiration Date:   07/09/2023   Ambulatory Referral to [facility] Nutrition    Referral Priority:   Routine    Referral Type:   Consultation    Referral Reason:   Specialty Services Required    Number of Visits Requested:   [place], PA-C 07/08/22  ADDENDUM: Hematology/Oncology Attending: I had a face-to-face encounter with the patient today.  I reviewed  her records, lab, scan as well as the molecular studies and recommended her care plan.  This is a very pleasant 71 years old white female diagnosed with a stage IV (T3, N3, M1 C) non-small cell lung cancer, adenocarcinoma diagnosed in July 2023 with negative PD-L1 expression and no  actionable mutations. The patient is currently undergoing palliative radiotherapy to the metastatic bone lesions under the care of Dr. Sondra Come to be completed on July 21, 2022. I had a lengthy discussion with the patient and her husband today about her current disease stage, prognosis and treatment options. I explained to the patient that she has incurable condition and all the treatment will be of palliative nature. She was given the option of palliative care and hospice referral versus consideration of palliative systemic chemotherapy with carboplatin for AUC of 5, Alimta 500 Mg/M2 and Keytruda 200 Mg IV every 3 weeks. The patient is interested in proceeding with the treatment.  I discussed with her and her husband the adverse effect of this treatment including but not limited to alopecia, myelosuppression, nausea and vomiting, peripheral neuropathy, liver or renal dysfunction as well as immunotherapy adverse effects. The patient would like to hold the start of her treatment until 1 week after completion of the radiotherapy. Will try to arrange her treatment to start on the week of July 28, 2022. She will have a chemotherapy education class before the first dose of her treatment. Will also arrange for the patient to receive vitamin B12 injection today and we will call her pharmacy with prescription for folic acid and Compazine.  Will also arrange for the patient to have a Port-A-Cath placed for IV access for the chemotherapy. She will come back for follow-up visit 1 week after her treatment for evaluation and management of any adverse effect of her chemotherapy. The patient was advised to call immediately if she has any concerning symptoms in the interval. Disclaimer: This note was dictated with voice recognition software. Similar sounding words can inadvertently be transcribed and may be missed upon review. Eilleen Kempf, MD 07/08/22

## 2022-07-03 ENCOUNTER — Other Ambulatory Visit: Payer: Self-pay

## 2022-07-03 ENCOUNTER — Ambulatory Visit (HOSPITAL_COMMUNITY)
Admission: RE | Admit: 2022-07-03 | Discharge: 2022-07-03 | Disposition: A | Discharge status: Home / Self Care | Payer: Medicare PPO | Source: Ambulatory Visit | Attending: Internal Medicine | Admitting: Internal Medicine

## 2022-07-03 ENCOUNTER — Ambulatory Visit
Admission: RE | Admit: 2022-07-03 | Discharge: 2022-07-03 | Disposition: A | Discharge status: Home / Self Care | Payer: Medicare PPO | Source: Ambulatory Visit | Attending: Radiation Oncology | Admitting: Radiation Oncology

## 2022-07-03 DIAGNOSIS — F17210 Nicotine dependence, cigarettes, uncomplicated: Secondary | ICD-10-CM | POA: Diagnosis not present

## 2022-07-03 DIAGNOSIS — C779 Secondary and unspecified malignant neoplasm of lymph node, unspecified: Secondary | ICD-10-CM | POA: Diagnosis not present

## 2022-07-03 DIAGNOSIS — C7951 Secondary malignant neoplasm of bone: Secondary | ICD-10-CM | POA: Diagnosis not present

## 2022-07-03 DIAGNOSIS — C3490 Malignant neoplasm of unspecified part of unspecified bronchus or lung: Secondary | ICD-10-CM | POA: Diagnosis not present

## 2022-07-03 DIAGNOSIS — C3491 Malignant neoplasm of unspecified part of right bronchus or lung: Secondary | ICD-10-CM | POA: Diagnosis not present

## 2022-07-03 DIAGNOSIS — I639 Cerebral infarction, unspecified: Secondary | ICD-10-CM | POA: Diagnosis not present

## 2022-07-03 DIAGNOSIS — M899 Disorder of bone, unspecified: Secondary | ICD-10-CM | POA: Diagnosis not present

## 2022-07-03 DIAGNOSIS — Z510 Encounter for antineoplastic radiation therapy: Secondary | ICD-10-CM | POA: Diagnosis not present

## 2022-07-03 LAB — RAD ONC ARIA SESSION SUMMARY
Course Elapsed Days: 1
Plan Fractions Treated to Date: 2
Plan Prescribed Dose Per Fraction: 2.5 Gy
Plan Total Fractions Prescribed: 14
Plan Total Prescribed Dose: 35 Gy
Reference Point Dosage Given to Date: 5 Gy
Reference Point Session Dosage Given: 2.5 Gy
Session Number: 2

## 2022-07-03 MED ORDER — GADOBUTROL 1 MMOL/ML IV SOLN
4.0000 mL | Freq: Once | INTRAVENOUS | Status: AC | PRN
  Administered 2022-07-03: 4 mL via INTRAVENOUS

## 2022-07-04 ENCOUNTER — Other Ambulatory Visit: Payer: Self-pay

## 2022-07-04 ENCOUNTER — Ambulatory Visit
Admission: RE | Admit: 2022-07-04 | Discharge: 2022-07-04 | Disposition: A | Discharge status: Home / Self Care | Payer: Medicare PPO | Source: Ambulatory Visit | Attending: Radiation Oncology | Admitting: Radiation Oncology

## 2022-07-04 DIAGNOSIS — C7951 Secondary malignant neoplasm of bone: Secondary | ICD-10-CM | POA: Diagnosis not present

## 2022-07-04 DIAGNOSIS — C779 Secondary and unspecified malignant neoplasm of lymph node, unspecified: Secondary | ICD-10-CM | POA: Diagnosis not present

## 2022-07-04 DIAGNOSIS — M899 Disorder of bone, unspecified: Secondary | ICD-10-CM | POA: Diagnosis not present

## 2022-07-04 DIAGNOSIS — F17210 Nicotine dependence, cigarettes, uncomplicated: Secondary | ICD-10-CM | POA: Diagnosis not present

## 2022-07-04 DIAGNOSIS — Z510 Encounter for antineoplastic radiation therapy: Secondary | ICD-10-CM | POA: Diagnosis not present

## 2022-07-04 DIAGNOSIS — C3491 Malignant neoplasm of unspecified part of right bronchus or lung: Secondary | ICD-10-CM | POA: Diagnosis not present

## 2022-07-04 LAB — RAD ONC ARIA SESSION SUMMARY
Course Elapsed Days: 2
Plan Fractions Treated to Date: 3
Plan Prescribed Dose Per Fraction: 2.5 Gy
Plan Total Fractions Prescribed: 14
Plan Total Prescribed Dose: 35 Gy
Reference Point Dosage Given to Date: 7.5 Gy
Reference Point Session Dosage Given: 2.5 Gy
Session Number: 3

## 2022-07-07 ENCOUNTER — Other Ambulatory Visit: Payer: Self-pay

## 2022-07-07 ENCOUNTER — Ambulatory Visit
Admission: RE | Admit: 2022-07-07 | Discharge: 2022-07-07 | Disposition: A | Discharge status: Home / Self Care | Payer: Medicare PPO | Source: Ambulatory Visit | Attending: Radiation Oncology | Admitting: Radiation Oncology

## 2022-07-07 DIAGNOSIS — C779 Secondary and unspecified malignant neoplasm of lymph node, unspecified: Secondary | ICD-10-CM | POA: Diagnosis not present

## 2022-07-07 DIAGNOSIS — F17210 Nicotine dependence, cigarettes, uncomplicated: Secondary | ICD-10-CM | POA: Diagnosis not present

## 2022-07-07 DIAGNOSIS — C3491 Malignant neoplasm of unspecified part of right bronchus or lung: Secondary | ICD-10-CM | POA: Diagnosis not present

## 2022-07-07 DIAGNOSIS — C7951 Secondary malignant neoplasm of bone: Secondary | ICD-10-CM | POA: Diagnosis not present

## 2022-07-07 DIAGNOSIS — M899 Disorder of bone, unspecified: Secondary | ICD-10-CM | POA: Diagnosis not present

## 2022-07-07 DIAGNOSIS — Z510 Encounter for antineoplastic radiation therapy: Secondary | ICD-10-CM | POA: Diagnosis not present

## 2022-07-07 LAB — RAD ONC ARIA SESSION SUMMARY
Course Elapsed Days: 5
Plan Fractions Treated to Date: 4
Plan Prescribed Dose Per Fraction: 2.5 Gy
Plan Total Fractions Prescribed: 14
Plan Total Prescribed Dose: 35 Gy
Reference Point Dosage Given to Date: 10 Gy
Reference Point Session Dosage Given: 2.5 Gy
Session Number: 4

## 2022-07-08 ENCOUNTER — Inpatient Hospital Stay: Payer: Medicare PPO | Attending: Internal Medicine

## 2022-07-08 ENCOUNTER — Other Ambulatory Visit: Payer: Self-pay

## 2022-07-08 ENCOUNTER — Ambulatory Visit
Admission: RE | Admit: 2022-07-08 | Discharge: 2022-07-08 | Disposition: A | Discharge status: Home / Self Care | Payer: Medicare PPO | Source: Ambulatory Visit | Attending: Radiation Oncology | Admitting: Radiation Oncology

## 2022-07-08 ENCOUNTER — Inpatient Hospital Stay: Payer: Medicare PPO

## 2022-07-08 ENCOUNTER — Inpatient Hospital Stay: Payer: Medicare PPO | Admitting: Physician Assistant

## 2022-07-08 VITALS — HR 85 | Temp 97.7°F | Resp 18

## 2022-07-08 DIAGNOSIS — I6782 Cerebral ischemia: Secondary | ICD-10-CM | POA: Insufficient documentation

## 2022-07-08 DIAGNOSIS — C3491 Malignant neoplasm of unspecified part of right bronchus or lung: Secondary | ICD-10-CM

## 2022-07-08 DIAGNOSIS — F17210 Nicotine dependence, cigarettes, uncomplicated: Secondary | ICD-10-CM | POA: Diagnosis not present

## 2022-07-08 DIAGNOSIS — C779 Secondary and unspecified malignant neoplasm of lymph node, unspecified: Secondary | ICD-10-CM | POA: Diagnosis not present

## 2022-07-08 DIAGNOSIS — M899 Disorder of bone, unspecified: Secondary | ICD-10-CM | POA: Diagnosis not present

## 2022-07-08 DIAGNOSIS — Z5111 Encounter for antineoplastic chemotherapy: Secondary | ICD-10-CM | POA: Insufficient documentation

## 2022-07-08 DIAGNOSIS — E538 Deficiency of other specified B group vitamins: Secondary | ICD-10-CM | POA: Insufficient documentation

## 2022-07-08 DIAGNOSIS — Z510 Encounter for antineoplastic radiation therapy: Secondary | ICD-10-CM | POA: Diagnosis not present

## 2022-07-08 DIAGNOSIS — I10 Essential (primary) hypertension: Secondary | ICD-10-CM | POA: Insufficient documentation

## 2022-07-08 DIAGNOSIS — C3490 Malignant neoplasm of unspecified part of unspecified bronchus or lung: Secondary | ICD-10-CM

## 2022-07-08 DIAGNOSIS — Z79899 Other long term (current) drug therapy: Secondary | ICD-10-CM | POA: Insufficient documentation

## 2022-07-08 DIAGNOSIS — C3411 Malignant neoplasm of upper lobe, right bronchus or lung: Secondary | ICD-10-CM | POA: Insufficient documentation

## 2022-07-08 DIAGNOSIS — Z5112 Encounter for antineoplastic immunotherapy: Secondary | ICD-10-CM | POA: Insufficient documentation

## 2022-07-08 DIAGNOSIS — J449 Chronic obstructive pulmonary disease, unspecified: Secondary | ICD-10-CM | POA: Insufficient documentation

## 2022-07-08 DIAGNOSIS — C7951 Secondary malignant neoplasm of bone: Secondary | ICD-10-CM

## 2022-07-08 DIAGNOSIS — M5136 Other intervertebral disc degeneration, lumbar region: Secondary | ICD-10-CM | POA: Insufficient documentation

## 2022-07-08 DIAGNOSIS — K219 Gastro-esophageal reflux disease without esophagitis: Secondary | ICD-10-CM | POA: Insufficient documentation

## 2022-07-08 DIAGNOSIS — G319 Degenerative disease of nervous system, unspecified: Secondary | ICD-10-CM | POA: Insufficient documentation

## 2022-07-08 DIAGNOSIS — R630 Anorexia: Secondary | ICD-10-CM | POA: Insufficient documentation

## 2022-07-08 DIAGNOSIS — R011 Cardiac murmur, unspecified: Secondary | ICD-10-CM | POA: Insufficient documentation

## 2022-07-08 LAB — CMP (CANCER CENTER ONLY)
ALT: 13 U/L (ref 0–44)
AST: 18 U/L (ref 15–41)
Albumin: 3.4 g/dL — ABNORMAL LOW (ref 3.5–5.0)
Alkaline Phosphatase: 214 U/L — ABNORMAL HIGH (ref 38–126)
Anion gap: 4 — ABNORMAL LOW (ref 5–15)
BUN: 8 mg/dL (ref 8–23)
CO2: 24 mmol/L (ref 22–32)
Calcium: 9.1 mg/dL (ref 8.9–10.3)
Chloride: 103 mmol/L (ref 98–111)
Creatinine: 0.36 mg/dL — ABNORMAL LOW (ref 0.44–1.00)
GFR, Estimated: >60 mL/min (ref >60)
Glucose, Bld: 93 mg/dL (ref 70–99)
Potassium: 3.7 mmol/L (ref 3.5–5.1)
Sodium: 131 mmol/L — ABNORMAL LOW (ref 135–145)
Total Bilirubin: 0.4 mg/dL (ref 0.3–1.2)
Total Protein: 7.3 g/dL (ref 6.5–8.1)

## 2022-07-08 LAB — RAD ONC ARIA SESSION SUMMARY
Course Elapsed Days: 6
Plan Fractions Treated to Date: 5
Plan Prescribed Dose Per Fraction: 2.5 Gy
Plan Total Fractions Prescribed: 14
Plan Total Prescribed Dose: 35 Gy
Reference Point Dosage Given to Date: 12.5 Gy
Reference Point Session Dosage Given: 2.5 Gy
Session Number: 5

## 2022-07-08 LAB — CBC WITH DIFFERENTIAL (CANCER CENTER ONLY)
Abs Immature Granulocytes: 0.06 10*3/uL (ref 0.00–0.07)
Basophils Absolute: 0 10*3/uL (ref 0.0–0.1)
Basophils Relative: 0 %
Eosinophils Absolute: 0.1 10*3/uL (ref 0.0–0.5)
Eosinophils Relative: 2 %
HCT: 38.9 % (ref 36.0–46.0)
Hemoglobin: 12.8 g/dL (ref 12.0–15.0)
Immature Granulocytes: 1 %
Lymphocytes Relative: 6 %
Lymphs Abs: 0.5 10*3/uL — ABNORMAL LOW (ref 0.7–4.0)
MCH: 28.8 pg (ref 26.0–34.0)
MCHC: 32.9 g/dL (ref 30.0–36.0)
MCV: 87.4 fL (ref 80.0–100.0)
Monocytes Absolute: 0.4 10*3/uL (ref 0.1–1.0)
Monocytes Relative: 5 %
Neutro Abs: 6.5 10*3/uL (ref 1.7–7.7)
Neutrophils Relative %: 86 %
Platelet Count: 156 10*3/uL (ref 150–400)
RBC: 4.45 MIL/uL (ref 3.87–5.11)
RDW: 14.8 % (ref 11.5–15.5)
WBC Count: 7.5 10*3/uL (ref 4.0–10.5)
nRBC: 0 % (ref 0.0–0.2)

## 2022-07-08 MED ORDER — SONAFINE EX EMUL
1.0000 | Freq: Once | CUTANEOUS | Status: AC
  Administered 2022-07-08: 1 via TOPICAL

## 2022-07-08 MED ORDER — PROCHLORPERAZINE MALEATE 10 MG PO TABS: 10.0000 mg | ORAL_TABLET | Freq: Four times a day (QID) | ORAL | 2 refills | Status: AC | PRN

## 2022-07-08 MED ORDER — CYANOCOBALAMIN 1000 MCG/ML IJ SOLN
1000.0000 ug | Freq: Once | INTRAMUSCULAR | Status: AC
  Administered 2022-07-08: 1000 ug via INTRAMUSCULAR
  Filled 2022-07-08: qty 1

## 2022-07-08 MED ORDER — OXYCODONE-ACETAMINOPHEN 5-325 MG PO TABS: 1.0000 | ORAL_TABLET | Freq: Three times a day (TID) | ORAL | 0 refills | Status: AC | PRN

## 2022-07-08 MED ORDER — FOLIC ACID 1 MG PO TABS: 1.0000 mg | ORAL_TABLET | Freq: Every day | ORAL | 2 refills | Status: AC

## 2022-07-08 NOTE — Progress Notes (Signed)
Pt here for patient teaching.  Pt given Radiation and You booklet, skin care instructions, and Sonafine.  Reviewed areas of pertinence such as diarrhea, fatigue, nausea and vomiting, and skin changes . Pt able to give teach back of to pat skin, use unscented/gentle soap, and have Imodium on hand,apply Sonafine bid and avoid applying anything to skin within 4 hours of treatment. Pt demonstrated understanding and verbalizes understanding of information given and will contact nursing with any questions or concerns.     [email or web address]/treatmentinformation/whattoexpect/index

## 2022-07-08 NOTE — Patient Instructions (Addendum)

## 2022-07-09 ENCOUNTER — Ambulatory Visit
Admission: RE | Admit: 2022-07-09 | Discharge: 2022-07-09 | Disposition: A | Discharge status: Home / Self Care | Payer: Medicare PPO | Source: Ambulatory Visit | Attending: Radiation Oncology | Admitting: Radiation Oncology

## 2022-07-09 ENCOUNTER — Other Ambulatory Visit: Payer: Self-pay | Admitting: Internal Medicine

## 2022-07-09 ENCOUNTER — Other Ambulatory Visit: Payer: Self-pay

## 2022-07-09 DIAGNOSIS — C3491 Malignant neoplasm of unspecified part of right bronchus or lung: Secondary | ICD-10-CM

## 2022-07-09 DIAGNOSIS — C779 Secondary and unspecified malignant neoplasm of lymph node, unspecified: Secondary | ICD-10-CM | POA: Diagnosis not present

## 2022-07-09 DIAGNOSIS — M899 Disorder of bone, unspecified: Secondary | ICD-10-CM | POA: Diagnosis not present

## 2022-07-09 DIAGNOSIS — Z510 Encounter for antineoplastic radiation therapy: Secondary | ICD-10-CM | POA: Diagnosis not present

## 2022-07-09 DIAGNOSIS — F17210 Nicotine dependence, cigarettes, uncomplicated: Secondary | ICD-10-CM | POA: Diagnosis not present

## 2022-07-09 DIAGNOSIS — C7951 Secondary malignant neoplasm of bone: Secondary | ICD-10-CM | POA: Diagnosis not present

## 2022-07-09 LAB — RAD ONC ARIA SESSION SUMMARY
Course Elapsed Days: 7
Plan Fractions Treated to Date: 6
Plan Prescribed Dose Per Fraction: 2.5 Gy
Plan Total Fractions Prescribed: 14
Plan Total Prescribed Dose: 35 Gy
Reference Point Dosage Given to Date: 15 Gy
Reference Point Session Dosage Given: 2.5 Gy
Session Number: 6

## 2022-07-09 NOTE — Progress Notes (Signed)
START ON PATHWAY REGIMEN - Non-Small Cell Lung     A cycle is every 21 days:     Pembrolizumab      Pemetrexed      Carboplatin   **Always confirm dose/schedule in your pharmacy ordering system**  Patient Characteristics: Stage IV Metastatic, Nonsquamous, Molecular Analysis Completed, Molecular Alteration Present and Targeted Therapy Exhausted OR EGFR Exon 20+ or KRAS G12C+ or HER2+ Present and No Prior Chemo/Immunotherapy OR No Alteration Present, Initial  Chemotherapy/Immunotherapy, PS = 0, 1, No Alteration Present, No Alteration Present, Candidate for Immunotherapy, PD-L1 Expression Positive 1-49% (TPS) / Negative / Not Tested / Awaiting Test Results and Immunotherapy Candidate Therapeutic Status: Stage IV Metastatic Histology: Nonsquamous Cell Broad Molecular Profiling Status: Molecular Analysis Completed Molecular Analysis Results: No Alteration Present ECOG Performance Status: 1 Chemotherapy/Immunotherapy Line of Therapy: Initial Chemotherapy/Immunotherapy EGFR Exons 18-21 Mutation Testing Status: Completed and Negative ALK Fusion/Rearrangement Testing Status: Completed and Negative BRAF V600 Mutation Testing Status: Completed and Negative KRAS G12C Mutation Testing Status: Completed and Negative MET Exon 14 Mutation Testing Status: Completed and Negative RET Fusion/Rearrangement Testing Status: Completed and Negative HER2 Mutation Testing Status: Completed and Negative NTRK Fusion/Rearrangement Testing Status: Completed and Negative ROS1 Fusion/Rearrangement Testing Status: Completed and Negative Immunotherapy Candidate Status: Candidate for Immunotherapy PD-L1 Expression Status: PD-L1 Negative Intent of Therapy: Non-Curative / Palliative Intent, Discussed with Patient

## 2022-07-10 ENCOUNTER — Ambulatory Visit
Admission: RE | Admit: 2022-07-10 | Discharge: 2022-07-10 | Disposition: A | Discharge status: Home / Self Care | Payer: Medicare PPO | Source: Ambulatory Visit | Attending: Radiation Oncology | Admitting: Radiation Oncology

## 2022-07-10 ENCOUNTER — Other Ambulatory Visit: Payer: Self-pay

## 2022-07-10 ENCOUNTER — Telehealth: Payer: Self-pay | Admitting: Internal Medicine

## 2022-07-10 DIAGNOSIS — M899 Disorder of bone, unspecified: Secondary | ICD-10-CM | POA: Diagnosis not present

## 2022-07-10 DIAGNOSIS — Z510 Encounter for antineoplastic radiation therapy: Secondary | ICD-10-CM | POA: Diagnosis not present

## 2022-07-10 DIAGNOSIS — F17210 Nicotine dependence, cigarettes, uncomplicated: Secondary | ICD-10-CM | POA: Diagnosis not present

## 2022-07-10 DIAGNOSIS — C779 Secondary and unspecified malignant neoplasm of lymph node, unspecified: Secondary | ICD-10-CM | POA: Diagnosis not present

## 2022-07-10 DIAGNOSIS — C3491 Malignant neoplasm of unspecified part of right bronchus or lung: Secondary | ICD-10-CM | POA: Diagnosis not present

## 2022-07-10 DIAGNOSIS — C7951 Secondary malignant neoplasm of bone: Secondary | ICD-10-CM | POA: Diagnosis not present

## 2022-07-10 LAB — RAD ONC ARIA SESSION SUMMARY
Course Elapsed Days: 8
Plan Fractions Treated to Date: 7
Plan Prescribed Dose Per Fraction: 2.5 Gy
Plan Total Fractions Prescribed: 14
Plan Total Prescribed Dose: 35 Gy
Reference Point Dosage Given to Date: 17.5 Gy
Reference Point Session Dosage Given: 2.5 Gy
Session Number: 7

## 2022-07-10 NOTE — Telephone Encounter (Signed)
Attempted to scheduled per 8/9 in basket, pt husband does not want to set up nutrition visit at this time, said he would call back to r/s

## 2022-07-11 ENCOUNTER — Other Ambulatory Visit: Payer: Self-pay

## 2022-07-11 ENCOUNTER — Ambulatory Visit
Admission: RE | Admit: 2022-07-11 | Discharge: 2022-07-11 | Disposition: A | Discharge status: Home / Self Care | Payer: Medicare PPO | Source: Ambulatory Visit | Attending: Radiation Oncology | Admitting: Radiation Oncology

## 2022-07-11 DIAGNOSIS — C779 Secondary and unspecified malignant neoplasm of lymph node, unspecified: Secondary | ICD-10-CM | POA: Diagnosis not present

## 2022-07-11 DIAGNOSIS — Z510 Encounter for antineoplastic radiation therapy: Secondary | ICD-10-CM | POA: Diagnosis not present

## 2022-07-11 DIAGNOSIS — C7951 Secondary malignant neoplasm of bone: Secondary | ICD-10-CM | POA: Diagnosis not present

## 2022-07-11 DIAGNOSIS — C3491 Malignant neoplasm of unspecified part of right bronchus or lung: Secondary | ICD-10-CM | POA: Diagnosis not present

## 2022-07-11 DIAGNOSIS — F17210 Nicotine dependence, cigarettes, uncomplicated: Secondary | ICD-10-CM | POA: Diagnosis not present

## 2022-07-11 DIAGNOSIS — M899 Disorder of bone, unspecified: Secondary | ICD-10-CM | POA: Diagnosis not present

## 2022-07-11 LAB — RAD ONC ARIA SESSION SUMMARY
Course Elapsed Days: 9
Plan Fractions Treated to Date: 8
Plan Prescribed Dose Per Fraction: 2.5 Gy
Plan Total Fractions Prescribed: 14
Plan Total Prescribed Dose: 35 Gy
Reference Point Dosage Given to Date: 20 Gy
Reference Point Session Dosage Given: 2.5 Gy
Session Number: 8

## 2022-07-14 ENCOUNTER — Ambulatory Visit
Admission: RE | Admit: 2022-07-14 | Discharge: 2022-07-14 | Disposition: A | Discharge status: Home / Self Care | Payer: Medicare PPO | Source: Ambulatory Visit | Attending: Radiation Oncology | Admitting: Radiation Oncology

## 2022-07-14 ENCOUNTER — Other Ambulatory Visit: Payer: Self-pay

## 2022-07-14 DIAGNOSIS — C7951 Secondary malignant neoplasm of bone: Secondary | ICD-10-CM | POA: Diagnosis not present

## 2022-07-14 DIAGNOSIS — M899 Disorder of bone, unspecified: Secondary | ICD-10-CM | POA: Diagnosis not present

## 2022-07-14 DIAGNOSIS — C3491 Malignant neoplasm of unspecified part of right bronchus or lung: Secondary | ICD-10-CM | POA: Diagnosis not present

## 2022-07-14 DIAGNOSIS — C779 Secondary and unspecified malignant neoplasm of lymph node, unspecified: Secondary | ICD-10-CM | POA: Diagnosis not present

## 2022-07-14 DIAGNOSIS — Z510 Encounter for antineoplastic radiation therapy: Secondary | ICD-10-CM | POA: Diagnosis not present

## 2022-07-14 DIAGNOSIS — F17210 Nicotine dependence, cigarettes, uncomplicated: Secondary | ICD-10-CM | POA: Diagnosis not present

## 2022-07-14 LAB — RAD ONC ARIA SESSION SUMMARY
Course Elapsed Days: 12
Plan Fractions Treated to Date: 9
Plan Prescribed Dose Per Fraction: 2.5 Gy
Plan Total Fractions Prescribed: 14
Plan Total Prescribed Dose: 35 Gy
Reference Point Dosage Given to Date: 22.5 Gy
Reference Point Session Dosage Given: 2.5 Gy
Session Number: 9

## 2022-07-15 ENCOUNTER — Ambulatory Visit: Payer: Medicare PPO

## 2022-07-15 ENCOUNTER — Other Ambulatory Visit: Payer: Self-pay

## 2022-07-15 ENCOUNTER — Ambulatory Visit
Admission: RE | Admit: 2022-07-15 | Discharge: 2022-07-15 | Disposition: A | Discharge status: Home / Self Care | Payer: Medicare PPO | Source: Ambulatory Visit | Attending: Radiation Oncology | Admitting: Radiation Oncology

## 2022-07-15 DIAGNOSIS — F17210 Nicotine dependence, cigarettes, uncomplicated: Secondary | ICD-10-CM | POA: Diagnosis not present

## 2022-07-15 DIAGNOSIS — C3491 Malignant neoplasm of unspecified part of right bronchus or lung: Secondary | ICD-10-CM | POA: Diagnosis not present

## 2022-07-15 DIAGNOSIS — C779 Secondary and unspecified malignant neoplasm of lymph node, unspecified: Secondary | ICD-10-CM | POA: Diagnosis not present

## 2022-07-15 DIAGNOSIS — Z510 Encounter for antineoplastic radiation therapy: Secondary | ICD-10-CM | POA: Diagnosis not present

## 2022-07-15 DIAGNOSIS — C7951 Secondary malignant neoplasm of bone: Secondary | ICD-10-CM | POA: Diagnosis not present

## 2022-07-15 DIAGNOSIS — M899 Disorder of bone, unspecified: Secondary | ICD-10-CM | POA: Diagnosis not present

## 2022-07-15 LAB — RAD ONC ARIA SESSION SUMMARY
Course Elapsed Days: 13
Plan Fractions Treated to Date: 10
Plan Prescribed Dose Per Fraction: 2.5 Gy
Plan Total Fractions Prescribed: 14
Plan Total Prescribed Dose: 35 Gy
Reference Point Dosage Given to Date: 25 Gy
Reference Point Session Dosage Given: 2.5 Gy
Session Number: 10

## 2022-07-16 ENCOUNTER — Ambulatory Visit
Admission: RE | Admit: 2022-07-16 | Discharge: 2022-07-16 | Disposition: A | Discharge status: Home / Self Care | Payer: Medicare PPO | Source: Ambulatory Visit | Attending: Radiation Oncology | Admitting: Radiation Oncology

## 2022-07-16 ENCOUNTER — Other Ambulatory Visit: Payer: Self-pay

## 2022-07-16 DIAGNOSIS — C7951 Secondary malignant neoplasm of bone: Secondary | ICD-10-CM | POA: Diagnosis not present

## 2022-07-16 DIAGNOSIS — C779 Secondary and unspecified malignant neoplasm of lymph node, unspecified: Secondary | ICD-10-CM | POA: Diagnosis not present

## 2022-07-16 DIAGNOSIS — F17210 Nicotine dependence, cigarettes, uncomplicated: Secondary | ICD-10-CM | POA: Diagnosis not present

## 2022-07-16 DIAGNOSIS — M899 Disorder of bone, unspecified: Secondary | ICD-10-CM | POA: Diagnosis not present

## 2022-07-16 DIAGNOSIS — C3491 Malignant neoplasm of unspecified part of right bronchus or lung: Secondary | ICD-10-CM | POA: Diagnosis not present

## 2022-07-16 DIAGNOSIS — Z510 Encounter for antineoplastic radiation therapy: Secondary | ICD-10-CM | POA: Diagnosis not present

## 2022-07-16 LAB — RAD ONC ARIA SESSION SUMMARY
Course Elapsed Days: 14
Plan Fractions Treated to Date: 11
Plan Prescribed Dose Per Fraction: 2.5 Gy
Plan Total Fractions Prescribed: 14
Plan Total Prescribed Dose: 35 Gy
Reference Point Dosage Given to Date: 27.5 Gy
Reference Point Session Dosage Given: 2.5 Gy
Session Number: 11

## 2022-07-17 ENCOUNTER — Ambulatory Visit
Admission: RE | Admit: 2022-07-17 | Discharge: 2022-07-17 | Disposition: A | Discharge status: Home / Self Care | Payer: Medicare PPO | Source: Ambulatory Visit | Attending: Radiation Oncology | Admitting: Radiation Oncology

## 2022-07-17 ENCOUNTER — Other Ambulatory Visit: Payer: Self-pay

## 2022-07-17 DIAGNOSIS — C7951 Secondary malignant neoplasm of bone: Secondary | ICD-10-CM | POA: Diagnosis not present

## 2022-07-17 DIAGNOSIS — Z510 Encounter for antineoplastic radiation therapy: Secondary | ICD-10-CM | POA: Diagnosis not present

## 2022-07-17 DIAGNOSIS — C779 Secondary and unspecified malignant neoplasm of lymph node, unspecified: Secondary | ICD-10-CM | POA: Diagnosis not present

## 2022-07-17 DIAGNOSIS — M899 Disorder of bone, unspecified: Secondary | ICD-10-CM | POA: Diagnosis not present

## 2022-07-17 DIAGNOSIS — C3491 Malignant neoplasm of unspecified part of right bronchus or lung: Secondary | ICD-10-CM | POA: Diagnosis not present

## 2022-07-17 DIAGNOSIS — F17210 Nicotine dependence, cigarettes, uncomplicated: Secondary | ICD-10-CM | POA: Diagnosis not present

## 2022-07-17 LAB — RAD ONC ARIA SESSION SUMMARY
Course Elapsed Days: 15
Plan Fractions Treated to Date: 12
Plan Prescribed Dose Per Fraction: 2.5 Gy
Plan Total Fractions Prescribed: 14
Plan Total Prescribed Dose: 35 Gy
Reference Point Dosage Given to Date: 30 Gy
Reference Point Session Dosage Given: 2.5 Gy
Session Number: 12

## 2022-07-17 LAB — FUNGAL ORGANISM REFLEX

## 2022-07-17 LAB — FUNGUS CULTURE WITH STAIN

## 2022-07-17 LAB — FUNGUS CULTURE RESULT

## 2022-07-18 ENCOUNTER — Other Ambulatory Visit: Payer: Self-pay

## 2022-07-18 ENCOUNTER — Ambulatory Visit
Admission: RE | Admit: 2022-07-18 | Discharge: 2022-07-18 | Disposition: A | Discharge status: Home / Self Care | Payer: Medicare PPO | Source: Ambulatory Visit | Attending: Radiation Oncology | Admitting: Radiation Oncology

## 2022-07-18 DIAGNOSIS — C779 Secondary and unspecified malignant neoplasm of lymph node, unspecified: Secondary | ICD-10-CM | POA: Diagnosis not present

## 2022-07-18 DIAGNOSIS — M899 Disorder of bone, unspecified: Secondary | ICD-10-CM | POA: Diagnosis not present

## 2022-07-18 DIAGNOSIS — Z510 Encounter for antineoplastic radiation therapy: Secondary | ICD-10-CM | POA: Diagnosis not present

## 2022-07-18 DIAGNOSIS — C7951 Secondary malignant neoplasm of bone: Secondary | ICD-10-CM | POA: Diagnosis not present

## 2022-07-18 DIAGNOSIS — C3491 Malignant neoplasm of unspecified part of right bronchus or lung: Secondary | ICD-10-CM | POA: Diagnosis not present

## 2022-07-18 DIAGNOSIS — F17210 Nicotine dependence, cigarettes, uncomplicated: Secondary | ICD-10-CM | POA: Diagnosis not present

## 2022-07-18 LAB — RAD ONC ARIA SESSION SUMMARY
Course Elapsed Days: 16
Plan Fractions Treated to Date: 13
Plan Prescribed Dose Per Fraction: 2.5 Gy
Plan Total Fractions Prescribed: 14
Plan Total Prescribed Dose: 35 Gy
Reference Point Dosage Given to Date: 32.5 Gy
Reference Point Session Dosage Given: 2.5 Gy
Session Number: 13

## 2022-07-21 ENCOUNTER — Ambulatory Visit
Admission: RE | Admit: 2022-07-21 | Discharge: 2022-07-21 | Disposition: A | Discharge status: Home / Self Care | Payer: Medicare PPO | Source: Ambulatory Visit | Attending: Radiation Oncology | Admitting: Radiation Oncology

## 2022-07-21 ENCOUNTER — Ambulatory Visit: Payer: Medicare PPO

## 2022-07-21 ENCOUNTER — Other Ambulatory Visit: Payer: Self-pay

## 2022-07-21 ENCOUNTER — Encounter: Payer: Medicare PPO | Admitting: Nutrition

## 2022-07-21 ENCOUNTER — Telehealth: Payer: Self-pay | Admitting: Internal Medicine

## 2022-07-21 DIAGNOSIS — F17210 Nicotine dependence, cigarettes, uncomplicated: Secondary | ICD-10-CM | POA: Diagnosis not present

## 2022-07-21 DIAGNOSIS — C3491 Malignant neoplasm of unspecified part of right bronchus or lung: Secondary | ICD-10-CM | POA: Diagnosis not present

## 2022-07-21 DIAGNOSIS — Z510 Encounter for antineoplastic radiation therapy: Secondary | ICD-10-CM | POA: Diagnosis not present

## 2022-07-21 DIAGNOSIS — C7951 Secondary malignant neoplasm of bone: Secondary | ICD-10-CM | POA: Diagnosis not present

## 2022-07-21 DIAGNOSIS — C779 Secondary and unspecified malignant neoplasm of lymph node, unspecified: Secondary | ICD-10-CM | POA: Diagnosis not present

## 2022-07-21 DIAGNOSIS — M899 Disorder of bone, unspecified: Secondary | ICD-10-CM | POA: Diagnosis not present

## 2022-07-21 LAB — RAD ONC ARIA SESSION SUMMARY
Course Elapsed Days: 19
Plan Fractions Treated to Date: 14
Plan Prescribed Dose Per Fraction: 2.5 Gy
Plan Total Fractions Prescribed: 14
Plan Total Prescribed Dose: 35 Gy
Reference Point Dosage Given to Date: 35 Gy
Reference Point Session Dosage Given: 2.5 Gy
Session Number: 14

## 2022-07-21 NOTE — Telephone Encounter (Signed)
Scheduled per 8/21 in basket, message has been left

## 2022-07-21 NOTE — Progress Notes (Signed)
Pharmacist Chemotherapy Monitoring - Initial Assessment    Anticipated start date: 07/28/22   The following has been reviewed per standard work regarding the patient's treatment regimen: The patient's diagnosis, treatment plan and drug doses, and organ/hematologic function Lab orders and baseline tests specific to treatment regimen  The treatment plan start date, drug sequencing, and pre-medications Prior authorization status  Patient's documented medication list, including drug-drug interaction screen and prescriptions for anti-emetics and supportive care specific to the treatment regimen The drug concentrations, fluid compatibility, administration routes, and timing of the medications to be used The patient's access for treatment and lifetime cumulative dose history, if applicable  The patient's medication allergies and previous infusion related reactions, if applicable   Changes made to treatment plan:  N/A  Follow up needed:  N/A   Larene Beach, RPH, 07/21/2022  1:31 PM

## 2022-07-23 LAB — ACID FAST CULTURE WITH REFLEXED SENSITIVITIES (MYCOBACTERIA): Acid Fast Culture: NEGATIVE

## 2022-07-24 ENCOUNTER — Inpatient Hospital Stay: Payer: Medicare PPO

## 2022-07-25 ENCOUNTER — Inpatient Hospital Stay: Payer: Medicare PPO

## 2022-07-25 ENCOUNTER — Encounter: Payer: Self-pay | Admitting: Internal Medicine

## 2022-07-25 ENCOUNTER — Other Ambulatory Visit: Payer: Self-pay

## 2022-07-25 MED FILL — Dexamethasone Sodium Phosphate Inj 100 MG/10ML: INTRAMUSCULAR | Qty: 1 | Status: AC

## 2022-07-25 MED FILL — Fosaprepitant Dimeglumine For IV Infusion 150 MG (Base Eq): INTRAVENOUS | Qty: 5 | Status: AC

## 2022-07-25 NOTE — Progress Notes (Signed)
Called pt to introduce myself as her Arboriculturist and to discuss the [employer].  Unfortunately there aren't any foundations offering copay assistance for her Dx and the type of ins she has.  I left a msg requesting she return my call if she's interested in applying for the grant.

## 2022-07-25 NOTE — Progress Notes (Signed)
Pt's husband returned my call so I went over the [employer] and gave him the income requirement.  They would like to apply so he will bring pt's proof of income on 07/28/22.  If approved I will give them an expense sheet and my card for any questions or concerns she may have in the future.

## 2022-07-25 NOTE — Patient Instructions (Addendum)
D

## 2022-07-26 ENCOUNTER — Other Ambulatory Visit: Payer: Self-pay

## 2022-07-28 ENCOUNTER — Ambulatory Visit: Payer: Medicare PPO | Admitting: Internal Medicine

## 2022-07-28 ENCOUNTER — Encounter: Payer: Self-pay | Admitting: Internal Medicine

## 2022-07-28 ENCOUNTER — Inpatient Hospital Stay (HOSPITAL_BASED_OUTPATIENT_CLINIC_OR_DEPARTMENT_OTHER): Payer: Medicare PPO

## 2022-07-28 ENCOUNTER — Other Ambulatory Visit: Payer: Self-pay

## 2022-07-28 ENCOUNTER — Inpatient Hospital Stay: Payer: Medicare PPO | Admitting: Internal Medicine

## 2022-07-28 VITALS — BP 136/67 | HR 100 | Temp 98.5°F | Resp 17 | Wt 84.0 lb

## 2022-07-28 DIAGNOSIS — Z5112 Encounter for antineoplastic immunotherapy: Secondary | ICD-10-CM | POA: Insufficient documentation

## 2022-07-28 DIAGNOSIS — C7951 Secondary malignant neoplasm of bone: Secondary | ICD-10-CM | POA: Insufficient documentation

## 2022-07-28 DIAGNOSIS — J449 Chronic obstructive pulmonary disease, unspecified: Secondary | ICD-10-CM | POA: Insufficient documentation

## 2022-07-28 DIAGNOSIS — G319 Degenerative disease of nervous system, unspecified: Secondary | ICD-10-CM | POA: Diagnosis not present

## 2022-07-28 DIAGNOSIS — Z79899 Other long term (current) drug therapy: Secondary | ICD-10-CM | POA: Insufficient documentation

## 2022-07-28 DIAGNOSIS — R011 Cardiac murmur, unspecified: Secondary | ICD-10-CM | POA: Diagnosis not present

## 2022-07-28 DIAGNOSIS — E538 Deficiency of other specified B group vitamins: Secondary | ICD-10-CM | POA: Diagnosis not present

## 2022-07-28 DIAGNOSIS — I10 Essential (primary) hypertension: Secondary | ICD-10-CM | POA: Diagnosis not present

## 2022-07-28 DIAGNOSIS — Z5111 Encounter for antineoplastic chemotherapy: Secondary | ICD-10-CM | POA: Diagnosis not present

## 2022-07-28 DIAGNOSIS — I6782 Cerebral ischemia: Secondary | ICD-10-CM | POA: Diagnosis not present

## 2022-07-28 DIAGNOSIS — K219 Gastro-esophageal reflux disease without esophagitis: Secondary | ICD-10-CM | POA: Insufficient documentation

## 2022-07-28 DIAGNOSIS — F17210 Nicotine dependence, cigarettes, uncomplicated: Secondary | ICD-10-CM | POA: Insufficient documentation

## 2022-07-28 DIAGNOSIS — C3491 Malignant neoplasm of unspecified part of right bronchus or lung: Secondary | ICD-10-CM

## 2022-07-28 DIAGNOSIS — M5136 Other intervertebral disc degeneration, lumbar region: Secondary | ICD-10-CM | POA: Diagnosis not present

## 2022-07-28 DIAGNOSIS — C3411 Malignant neoplasm of upper lobe, right bronchus or lung: Secondary | ICD-10-CM | POA: Diagnosis not present

## 2022-07-28 DIAGNOSIS — R630 Anorexia: Secondary | ICD-10-CM | POA: Diagnosis not present

## 2022-07-28 LAB — CMP (CANCER CENTER ONLY)
ALT: 11 U/L (ref 0–44)
AST: 22 U/L (ref 15–41)
Albumin: 3.2 g/dL — ABNORMAL LOW (ref 3.5–5.0)
Alkaline Phosphatase: 184 U/L — ABNORMAL HIGH (ref 38–126)
Anion gap: 6 (ref 5–15)
BUN: 9 mg/dL (ref 8–23)
CO2: 27 mmol/L (ref 22–32)
Calcium: 9.6 mg/dL (ref 8.9–10.3)
Chloride: 94 mmol/L — ABNORMAL LOW (ref 98–111)
Creatinine: <0.3 mg/dL — ABNORMAL LOW (ref 0.44–1.00)
Glucose, Bld: 124 mg/dL — ABNORMAL HIGH (ref 70–99)
Potassium: 4.3 mmol/L (ref 3.5–5.1)
Sodium: 127 mmol/L — ABNORMAL LOW (ref 135–145)
Total Bilirubin: 0.7 mg/dL (ref 0.3–1.2)
Total Protein: 6.9 g/dL (ref 6.5–8.1)

## 2022-07-28 LAB — CBC WITH DIFFERENTIAL (CANCER CENTER ONLY)
Abs Immature Granulocytes: 0.15 10*3/uL — ABNORMAL HIGH (ref 0.00–0.07)
Basophils Absolute: 0.1 10*3/uL (ref 0.0–0.1)
Basophils Relative: 0 %
Eosinophils Absolute: 0 10*3/uL (ref 0.0–0.5)
Eosinophils Relative: 0 %
HCT: 41.9 % (ref 36.0–46.0)
Hemoglobin: 14 g/dL (ref 12.0–15.0)
Immature Granulocytes: 1 %
Lymphocytes Relative: 2 %
Lymphs Abs: 0.2 10*3/uL — ABNORMAL LOW (ref 0.7–4.0)
MCH: 28.6 pg (ref 26.0–34.0)
MCHC: 33.4 g/dL (ref 30.0–36.0)
MCV: 85.5 fL (ref 80.0–100.0)
Monocytes Absolute: 0.7 10*3/uL (ref 0.1–1.0)
Monocytes Relative: 4 %
Neutro Abs: 14.2 10*3/uL — ABNORMAL HIGH (ref 1.7–7.7)
Neutrophils Relative %: 93 %
Platelet Count: 183 10*3/uL (ref 150–400)
RBC: 4.9 MIL/uL (ref 3.87–5.11)
RDW: 14.3 % (ref 11.5–15.5)
WBC Count: 15.3 10*3/uL — ABNORMAL HIGH (ref 4.0–10.5)
nRBC: 0 % (ref 0.0–0.2)

## 2022-07-28 LAB — TSH: TSH: 2.736 u[IU]/mL (ref 0.350–4.500)

## 2022-07-28 MED ORDER — PALONOSETRON HCL INJECTION 0.25 MG/5ML
0.2500 mg | Freq: Once | INTRAVENOUS | Status: AC
  Administered 2022-07-28: 0.25 mg via INTRAVENOUS
  Filled 2022-07-28: qty 5

## 2022-07-28 MED ORDER — SODIUM CHLORIDE 0.9 % IV SOLN
500.0000 mg/m2 | Freq: Once | INTRAVENOUS | Status: AC
  Administered 2022-07-28: 700 mg via INTRAVENOUS
  Filled 2022-07-28: qty 20

## 2022-07-28 MED ORDER — SODIUM CHLORIDE 0.9 % IV SOLN
150.0000 mg | Freq: Once | INTRAVENOUS | Status: AC
  Administered 2022-07-28: 150 mg via INTRAVENOUS
  Filled 2022-07-28: qty 150

## 2022-07-28 MED ORDER — SODIUM CHLORIDE 0.9 % IV SOLN
10.0000 mg | Freq: Once | INTRAVENOUS | Status: AC
  Administered 2022-07-28: 10 mg via INTRAVENOUS
  Filled 2022-07-28: qty 10

## 2022-07-28 MED ORDER — SODIUM CHLORIDE 0.9 % IV SOLN
295.0000 mg | Freq: Once | INTRAVENOUS | Status: AC
  Administered 2022-07-28: 300 mg via INTRAVENOUS
  Filled 2022-07-28: qty 30

## 2022-07-28 MED ORDER — SODIUM CHLORIDE 0.9 % IV SOLN: Freq: Once | INTRAVENOUS | Status: AC

## 2022-07-28 MED ORDER — SODIUM CHLORIDE 0.9 % IV SOLN
200.0000 mg | Freq: Once | INTRAVENOUS | Status: AC
  Administered 2022-07-28: 200 mg via INTRAVENOUS
  Filled 2022-07-28: qty 200

## 2022-07-28 NOTE — Patient Instructions (Signed)
[facility]  Discharge Instructions: Thank you for choosing [facility] to provide your oncology and hematology care.   If you have a lab appointment with the [facility], please go directly to the [facility] and check in at the registration area.   Wear comfortable clothing and clothing appropriate for easy access to any Portacath or PICC line.   We strive to give you quality time with your provider. You may need to reschedule your appointment if you arrive late (15 or more minutes).  Arriving late affects you and other patients whose appointments are after yours.  Also, if you miss three or more appointments without notifying the office, you may be dismissed from the clinic at the provider's discretion.      For prescription refill requests, have your pharmacy contact our office and allow 72 hours for refills to be completed.    Today you received the following chemotherapy and/or immunotherapy agents : Keytruda, Alimta, Carboplatin      To help prevent nausea and vomiting after your treatment, we encourage you to take your nausea medication as directed.  BELOW ARE SYMPTOMS THAT SHOULD BE REPORTED IMMEDIATELY: *FEVER GREATER THAN 100.4 F (38 C) OR HIGHER *CHILLS OR SWEATING *NAUSEA AND VOMITING THAT IS NOT CONTROLLED WITH YOUR NAUSEA MEDICATION *UNUSUAL SHORTNESS OF BREATH *UNUSUAL BRUISING OR BLEEDING *URINARY PROBLEMS (pain or burning when urinating, or frequent urination) *BOWEL PROBLEMS (unusual diarrhea, constipation, pain near the anus) TENDERNESS IN MOUTH AND THROAT WITH OR WITHOUT PRESENCE OF ULCERS (sore throat, sores in mouth, or a toothache) UNUSUAL RASH, SWELLING OR PAIN  UNUSUAL VAGINAL DISCHARGE OR ITCHING   Items with * indicate a potential emergency and should be followed up as soon as possible or go to the Emergency Department if any problems should occur.  Please show the CHEMOTHERAPY ALERT CARD or IMMUNOTHERAPY ALERT  CARD at check-in to the Emergency Department and triage nurse.  Should you have questions after your visit or need to cancel or reschedule your appointment, please contact [facility]  Dept: [phone]  and follow the prompts.  Office hours are 8:00 a.m. to 4:30 p.m. Monday - Friday. Please note that voicemails left after 4:00 p.m. may not be returned until the following business day.  We are closed weekends and major holidays. You have access to a nurse at all times for urgent questions. Please call the main number to the clinic Dept: [phone] and follow the prompts.   For any non-urgent questions, you may also contact your provider using MyChart. We now offer e-Visits for anyone 17 and older to request care online for non-urgent symptoms. For details visit [email or web address].   Also download the MyChart app! Go to the app store, search "MyChart", open the app, select [facility], and log in with your MyChart username and password.  Masks are optional in the cancer centers. If you would like for your care team to wear a mask while they are taking care of you, please let them know. You may have one support person who is at least 72 years old accompany you for your appointments. Pembrolizumab Injection What is this medication? PEMBROLIZUMAB (PEM broe LIZ ue mab) treats some types of cancer. It works by helping your immune system slow or stop the spread of cancer cells. It is a monoclonal antibody. This medicine may be used for other purposes; ask your health care provider or pharmacist if you have questions. COMMON BRAND NAME(S):  Keytruda What should I tell my care team before I take this medication? They need to know if you have any of these conditions: Allogeneic stem cell transplant (uses someone else's stem cells) Autoimmune diseases, such as Crohn disease, ulcerative colitis, lupus History of chest radiation Nervous system problems, such as  Guillain-Barre syndrome, myasthenia gravis Organ transplant An unusual or allergic reaction to pembrolizumab, other medications, foods, dyes, or preservatives Pregnant or trying to get pregnant Breast-feeding How should I use this medication? This medication is injected into a vein. It is given by your care team in a hospital or clinic setting. A special MedGuide will be given to you before each treatment. Be sure to read this information carefully each time. Talk to your care team about the use of this medication in children. While it may be prescribed for children as young as 6 months for selected conditions, precautions do apply. Overdosage: If you think you have taken too much of this medicine contact a poison control center or emergency room at once. NOTE: This medicine is only for you. Do not share this medicine with others. What if I miss a dose? Keep appointments for follow-up doses. It is important not to miss your dose. Call your care team if you are unable to keep an appointment. What may interact with this medication? Interactions have not been studied. This list may not describe all possible interactions. Give your health care provider a list of all the medicines, herbs, non-prescription drugs, or dietary supplements you use. Also tell them if you smoke, drink alcohol, or use illegal drugs. Some items may interact with your medicine. What should I watch for while using this medication? Your condition will be monitored carefully while you are receiving this medication. You may need blood work while taking this medication. This medication may cause serious skin reactions. They can happen weeks to months after starting the medication. Contact your care team right away if you notice fevers or flu-like symptoms with a rash. The rash may be red or purple and then turn into blisters or peeling of the skin. You may also notice a red rash with swelling of the face, lips, or lymph nodes in your  neck or under your arms. Tell your care team right away if you have any change in your eyesight. Talk to your care team if you may be pregnant. Serious birth defects can occur if you take this medication during pregnancy and for 4 months after the last dose. You will need a negative pregnancy test before starting this medication. Contraception is recommended while taking this medication and for 4 months after the last dose. Your care team can help you find the option that works for you. Do not breastfeed while taking this medication and for 4 months after the last dose. What side effects may I notice from receiving this medication? Side effects that you should report to your care team as soon as possible: Allergic reactions--skin rash, itching, hives, swelling of the face, lips, tongue, or throat Dry cough, shortness of breath or trouble breathing Eye pain, redness, irritation, or discharge with blurry or decreased vision Heart muscle inflammation--unusual weakness or fatigue, shortness of breath, chest pain, fast or irregular heartbeat, dizziness, swelling of the ankles, feet, or hands Hormone gland problems--headache, sensitivity to light, unusual weakness or fatigue, dizziness, fast or irregular heartbeat, increased sensitivity to cold or heat, excessive sweating, constipation, hair loss, increased thirst or amount of urine, tremors or shaking, irritability Infusion reactions--chest pain, shortness  of breath or trouble breathing, feeling faint or lightheaded Kidney injury (glomerulonephritis)--decrease in the amount of urine, red or dark brown urine, foamy or bubbly urine, swelling of the ankles, hands, or feet Liver injury--right upper belly pain, loss of appetite, nausea, light-colored stool, dark yellow or brown urine, yellowing skin or eyes, unusual weakness or fatigue Pain, tingling, or numbness in the hands or feet, muscle weakness, change in vision, confusion or trouble speaking, loss of  balance or coordination, trouble walking, seizures Rash, fever, and swollen lymph nodes Redness, blistering, peeling, or loosening of the skin, including inside the mouth Sudden or severe stomach pain, bloody diarrhea, fever, nausea, vomiting Side effects that usually do not require medical attention (report to your care team if they continue or are bothersome): Bone, joint, or muscle pain Diarrhea Fatigue Loss of appetite Nausea Skin rash This list may not describe all possible side effects. Call your doctor for medical advice about side effects. You may report side effects to FDA at 1-800-FDA-1088. Where should I keep my medication? This medication is given in a hospital or clinic. It will not be stored at home. NOTE: This sheet is a summary. It may not cover all possible information. If you have questions about this medicine, talk to your doctor, pharmacist, or health care provider.  2023 Elsevier/Gold Standard (2022-03-10 00:00:00) Pemetrexed Injection What is this medication? PEMETREXED (PEM e TREX ed) treats some types of cancer. It works by slowing down the growth of cancer cells. This medicine may be used for other purposes; ask your health care provider or pharmacist if you have questions. COMMON BRAND NAME(S): Alimta, PEMFEXY What should I tell my care team before I take this medication? They need to know if you have any of these conditions: Infection, such as chickenpox, cold sores, or herpes Kidney disease Low blood cell levels (white cells, red cells, and platelets) Lung or breathing disease, such as asthma Radiation therapy An unusual or allergic reaction to pemetrexed, other medications, foods, dyes, or preservatives If you or your partner are pregnant or trying to get pregnant Breast-feeding How should I use this medication? This medication is injected into a vein. It is given by your care team in a hospital or clinic setting. Talk to your care team about the use of  this medication in children. Special care may be needed. Overdosage: If you think you have taken too much of this medicine contact a poison control center or emergency room at once. NOTE: This medicine is only for you. Do not share this medicine with others. What if I miss a dose? Keep appointments for follow-up doses. It is important not to miss your dose. Call your care team if you are unable to keep an appointment. What may interact with this medication? Do not take this medication with any of the following: Live virus vaccines This medication may also interact with the following: Ibuprofen This list may not describe all possible interactions. Give your health care provider a list of all the medicines, herbs, non-prescription drugs, or dietary supplements you use. Also tell them if you smoke, drink alcohol, or use illegal drugs. Some items may interact with your medicine. What should I watch for while using this medication? Your condition will be monitored carefully while you are receiving this medication. This medication may make you feel generally unwell. This is not uncommon as chemotherapy can affect healthy cells as well as cancer cells. Report any side effects. Continue your course of treatment even though you feel  ill unless your care team tells you to stop. This medication can cause serious side effects. To reduce the risk, your care team may give you other medications to take before receiving this one. Be sure to follow the directions from your care team. This medication can cause a rash or redness in areas of the body that have previously had radiation therapy. If you have had radiation therapy, tell your care team if you notice a rash in this area. This medication may increase your risk of getting an infection. Call your care team for advice if you get a fever, chills, sore throat, or other symptoms of a cold or flu. Do not treat yourself. Try to avoid being around people who are  sick. Be careful brushing or flossing your teeth or using a toothpick because you may get an infection or bleed more easily. If you have any dental work done, tell your dentist you are receiving this medication. Avoid taking medications that contain aspirin, acetaminophen, ibuprofen, naproxen, or ketoprofen unless instructed by your care team. These medications may hide a fever. Check with your care team if you have severe diarrhea, nausea, and vomiting, or if you sweat a lot. The loss of too much body fluid may make it dangerous for you to take this medication. Talk to your care team if you or your partner wish to become pregnant or think either of you might be pregnant. This medication can cause serious birth defects if taken during pregnancy and for 6 months after the last dose. A negative pregnancy test is required before starting this medication. A reliable form of contraception is recommended while taking this medication and for 6 months after the last dose. Talk to your care team about reliable forms of contraception. Do not father a child while taking this medication and for 3 months after the last dose. Use a condom while having sex during this time period. Do not breastfeed while taking this medication and for 1 week after the last dose. This medication may cause infertility. Talk to your care team if you are concerned about your fertility. What side effects may I notice from receiving this medication? Side effects that you should report to your care team as soon as possible: Allergic reactions--skin rash, itching, hives, swelling of the face, lips, tongue, or throat Dry cough, shortness of breath or trouble breathing Infection--fever, chills, cough, sore throat, wounds that don't heal, pain or trouble when passing urine, general feeling of discomfort or being unwell Kidney injury--decrease in the amount of urine, swelling of the ankles, hands, or feet Low red blood cell level--unusual  weakness or fatigue, dizziness, headache, trouble breathing Redness, blistering, peeling, or loosening of the skin, including inside the mouth Unusual bruising or bleeding Side effects that usually do not require medical attention (report to your care team if they continue or are bothersome): Fatigue Loss of appetite Nausea Vomiting This list may not describe all possible side effects. Call your doctor for medical advice about side effects. You may report side effects to FDA at 1-800-FDA-1088. Where should I keep my medication? This medication is given in a hospital or clinic. It will not be stored at home. NOTE: This sheet is a summary. It may not cover all possible information. If you have questions about this medicine, talk to your doctor, pharmacist, or health care provider.  2023 Elsevier/Gold Standard (2022-03-24 00:00:00) Carboplatin Injection What is this medication? CARBOPLATIN (KAR boe pla tin) treats some types of cancer. It works by  slowing down the growth of cancer cells. This medicine may be used for other purposes; ask your health care provider or pharmacist if you have questions. COMMON BRAND NAME(S): Paraplatin What should I tell my care team before I take this medication? They need to know if you have any of these conditions: Blood disorders Hearing problems Kidney disease Recent or ongoing radiation therapy An unusual or allergic reaction to carboplatin, cisplatin, other medications, foods, dyes, or preservatives Pregnant or trying to get pregnant Breast-feeding How should I use this medication? This medication is injected into a vein. It is given by your care team in a hospital or clinic setting. Talk to your care team about the use of this medication in children. Special care may be needed. Overdosage: If you think you have taken too much of this medicine contact a poison control center or emergency room at once. NOTE: This medicine is only for you. Do not share  this medicine with others. What if I miss a dose? Keep appointments for follow-up doses. It is important not to miss your dose. Call your care team if you are unable to keep an appointment. What may interact with this medication? Medications for seizures Some antibiotics, such as amikacin, gentamicin, neomycin, streptomycin, tobramycin Vaccines This list may not describe all possible interactions. Give your health care provider a list of all the medicines, herbs, non-prescription drugs, or dietary supplements you use. Also tell them if you smoke, drink alcohol, or use illegal drugs. Some items may interact with your medicine. What should I watch for while using this medication? Your condition will be monitored carefully while you are receiving this medication. You may need blood work while taking this medication. This medication may make you feel generally unwell. This is not uncommon, as chemotherapy can affect healthy cells as well as cancer cells. Report any side effects. Continue your course of treatment even though you feel ill unless your care team tells you to stop. In some cases, you may be given additional medications to help with side effects. Follow all directions for their use. This medication may increase your risk of getting an infection. Call your care team for advice if you get a fever, chills, sore throat, or other symptoms of a cold or flu. Do not treat yourself. Try to avoid being around people who are sick. Avoid taking medications that contain aspirin, acetaminophen, ibuprofen, naproxen, or ketoprofen unless instructed by your care team. These medications may hide a fever. Be careful brushing or flossing your teeth or using a toothpick because you may get an infection or bleed more easily. If you have any dental work done, tell your dentist you are receiving this medication. Talk to your care team if you wish to become pregnant or think you might be pregnant. This medication can  cause serious birth defects. Talk to your care team about effective forms of contraception. Do not breast-feed while taking this medication. What side effects may I notice from receiving this medication? Side effects that you should report to your care team as soon as possible: Allergic reactions--skin rash, itching, hives, swelling of the face, lips, tongue, or throat Infection--fever, chills, cough, sore throat, wounds that don't heal, pain or trouble when passing urine, general feeling of discomfort or being unwell Low red blood cell level--unusual weakness or fatigue, dizziness, headache, trouble breathing Pain, tingling, or numbness in the hands or feet, muscle weakness, change in vision, confusion or trouble speaking, loss of balance or coordination, trouble walking, seizures  Unusual bruising or bleeding Side effects that usually do not require medical attention (report to your care team if they continue or are bothersome): Hair loss Nausea Unusual weakness or fatigue Vomiting This list may not describe all possible side effects. Call your doctor for medical advice about side effects. You may report side effects to FDA at 1-800-FDA-1088. Where should I keep my medication? This medication is given in a hospital or clinic. It will not be stored at home. NOTE: This sheet is a summary. It may not cover all possible information. If you have questions about this medicine, talk to your doctor, pharmacist, or health care provider.  2023 Elsevier/Gold Standard (2022-03-11 00:00:00)

## 2022-07-28 NOTE — Progress Notes (Signed)
Ok to proceed with alimta per Dr Julien Nordmann

## 2022-07-28 NOTE — Progress Notes (Signed)
Pt is approved for the $1000 [employer] grant.  

## 2022-07-28 NOTE — Progress Notes (Signed)
Patient obtained and abrasion on her right posterior lower leg as she was transferring to the wheelchair in lab. In infusion room unwrapped the leg, cleaned a 6cm abrasion with NS, covered with Vaseline gauze, non adhesive dressing, 4x4 gauze and wrapped in coban. Patient stated that it felt much better and denies any pain. Will update spouse when he returns.

## 2022-08-03 ENCOUNTER — Emergency Department (HOSPITAL_COMMUNITY): Payer: Medicare PPO

## 2022-08-03 ENCOUNTER — Encounter (HOSPITAL_COMMUNITY): Payer: Self-pay | Admitting: Emergency Medicine

## 2022-08-03 ENCOUNTER — Other Ambulatory Visit: Payer: Self-pay

## 2022-08-03 ENCOUNTER — Inpatient Hospital Stay (HOSPITAL_COMMUNITY)
Admission: EM | Admit: 2022-08-03 | Discharge: 2022-08-31 | DRG: 193 | Disposition: E | Payer: Medicare PPO | Attending: Internal Medicine | Admitting: Internal Medicine

## 2022-08-03 DIAGNOSIS — I2119 ST elevation (STEMI) myocardial infarction involving other coronary artery of inferior wall: Secondary | ICD-10-CM | POA: Diagnosis not present

## 2022-08-03 DIAGNOSIS — F419 Anxiety disorder, unspecified: Secondary | ICD-10-CM | POA: Diagnosis present

## 2022-08-03 DIAGNOSIS — Z681 Body mass index (BMI) 19 or less, adult: Secondary | ICD-10-CM | POA: Diagnosis not present

## 2022-08-03 DIAGNOSIS — D696 Thrombocytopenia, unspecified: Secondary | ICD-10-CM | POA: Diagnosis present

## 2022-08-03 DIAGNOSIS — J439 Emphysema, unspecified: Secondary | ICD-10-CM | POA: Diagnosis present

## 2022-08-03 DIAGNOSIS — R570 Cardiogenic shock: Secondary | ICD-10-CM | POA: Diagnosis not present

## 2022-08-03 DIAGNOSIS — Z803 Family history of malignant neoplasm of breast: Secondary | ICD-10-CM

## 2022-08-03 DIAGNOSIS — Z515 Encounter for palliative care: Secondary | ICD-10-CM

## 2022-08-03 DIAGNOSIS — D72819 Decreased white blood cell count, unspecified: Secondary | ICD-10-CM | POA: Diagnosis present

## 2022-08-03 DIAGNOSIS — E43 Unspecified severe protein-calorie malnutrition: Secondary | ICD-10-CM | POA: Diagnosis present

## 2022-08-03 DIAGNOSIS — R079 Chest pain, unspecified: Secondary | ICD-10-CM | POA: Diagnosis not present

## 2022-08-03 DIAGNOSIS — R64 Cachexia: Secondary | ICD-10-CM | POA: Diagnosis present

## 2022-08-03 DIAGNOSIS — A419 Sepsis, unspecified organism: Secondary | ICD-10-CM | POA: Diagnosis not present

## 2022-08-03 DIAGNOSIS — Z825 Family history of asthma and other chronic lower respiratory diseases: Secondary | ICD-10-CM

## 2022-08-03 DIAGNOSIS — F17210 Nicotine dependence, cigarettes, uncomplicated: Secondary | ICD-10-CM | POA: Diagnosis present

## 2022-08-03 DIAGNOSIS — K219 Gastro-esophageal reflux disease without esophagitis: Secondary | ICD-10-CM | POA: Diagnosis present

## 2022-08-03 DIAGNOSIS — Z923 Personal history of irradiation: Secondary | ICD-10-CM

## 2022-08-03 DIAGNOSIS — I213 ST elevation (STEMI) myocardial infarction of unspecified site: Principal | ICD-10-CM

## 2022-08-03 DIAGNOSIS — Z66 Do not resuscitate: Secondary | ICD-10-CM | POA: Diagnosis present

## 2022-08-03 DIAGNOSIS — J479 Bronchiectasis, uncomplicated: Secondary | ICD-10-CM

## 2022-08-03 DIAGNOSIS — F319 Bipolar disorder, unspecified: Secondary | ICD-10-CM | POA: Diagnosis not present

## 2022-08-03 DIAGNOSIS — Z882 Allergy status to sulfonamides status: Secondary | ICD-10-CM

## 2022-08-03 DIAGNOSIS — C799 Secondary malignant neoplasm of unspecified site: Secondary | ICD-10-CM | POA: Diagnosis present

## 2022-08-03 DIAGNOSIS — R6510 Systemic inflammatory response syndrome (SIRS) of non-infectious origin without acute organ dysfunction: Secondary | ICD-10-CM | POA: Diagnosis present

## 2022-08-03 DIAGNOSIS — I2510 Atherosclerotic heart disease of native coronary artery without angina pectoris: Secondary | ICD-10-CM | POA: Diagnosis present

## 2022-08-03 DIAGNOSIS — I2111 ST elevation (STEMI) myocardial infarction involving right coronary artery: Secondary | ICD-10-CM | POA: Diagnosis not present

## 2022-08-03 DIAGNOSIS — Z9221 Personal history of antineoplastic chemotherapy: Secondary | ICD-10-CM

## 2022-08-03 DIAGNOSIS — C3491 Malignant neoplasm of unspecified part of right bronchus or lung: Secondary | ICD-10-CM | POA: Diagnosis present

## 2022-08-03 DIAGNOSIS — Z91040 Latex allergy status: Secondary | ICD-10-CM

## 2022-08-03 DIAGNOSIS — Z79899 Other long term (current) drug therapy: Secondary | ICD-10-CM

## 2022-08-03 DIAGNOSIS — K7460 Unspecified cirrhosis of liver: Secondary | ICD-10-CM | POA: Diagnosis present

## 2022-08-03 DIAGNOSIS — J189 Pneumonia, unspecified organism: Secondary | ICD-10-CM | POA: Diagnosis present

## 2022-08-03 DIAGNOSIS — Z8249 Family history of ischemic heart disease and other diseases of the circulatory system: Secondary | ICD-10-CM | POA: Diagnosis not present

## 2022-08-03 DIAGNOSIS — J470 Bronchiectasis with acute lower respiratory infection: Secondary | ICD-10-CM | POA: Diagnosis present

## 2022-08-03 DIAGNOSIS — Z888 Allergy status to other drugs, medicaments and biological substances status: Secondary | ICD-10-CM

## 2022-08-03 DIAGNOSIS — I10 Essential (primary) hypertension: Secondary | ICD-10-CM | POA: Diagnosis present

## 2022-08-03 DIAGNOSIS — Z20822 Contact with and (suspected) exposure to covid-19: Secondary | ICD-10-CM | POA: Diagnosis present

## 2022-08-03 DIAGNOSIS — R000 Tachycardia, unspecified: Secondary | ICD-10-CM | POA: Diagnosis present

## 2022-08-03 DIAGNOSIS — K5904 Chronic idiopathic constipation: Secondary | ICD-10-CM | POA: Diagnosis present

## 2022-08-03 DIAGNOSIS — M1990 Unspecified osteoarthritis, unspecified site: Secondary | ICD-10-CM | POA: Diagnosis present

## 2022-08-03 DIAGNOSIS — E860 Dehydration: Secondary | ICD-10-CM | POA: Diagnosis present

## 2022-08-03 DIAGNOSIS — E871 Hypo-osmolality and hyponatremia: Secondary | ICD-10-CM | POA: Diagnosis not present

## 2022-08-03 DIAGNOSIS — Z83438 Family history of other disorder of lipoprotein metabolism and other lipidemia: Secondary | ICD-10-CM

## 2022-08-03 DIAGNOSIS — Z91048 Other nonmedicinal substance allergy status: Secondary | ICD-10-CM

## 2022-08-03 DIAGNOSIS — I442 Atrioventricular block, complete: Secondary | ICD-10-CM | POA: Diagnosis present

## 2022-08-03 DIAGNOSIS — Z823 Family history of stroke: Secondary | ICD-10-CM

## 2022-08-03 LAB — COMPREHENSIVE METABOLIC PANEL
ALT: 16 U/L (ref 0–44)
AST: 32 U/L (ref 15–41)
Albumin: 2.8 g/dL — ABNORMAL LOW (ref 3.5–5.0)
Alkaline Phosphatase: 150 U/L — ABNORMAL HIGH (ref 38–126)
Anion gap: 16 — ABNORMAL HIGH (ref 5–15)
BUN: 19 mg/dL (ref 8–23)
CO2: 21 mmol/L — ABNORMAL LOW (ref 22–32)
Calcium: 9.2 mg/dL (ref 8.9–10.3)
Chloride: 91 mmol/L — ABNORMAL LOW (ref 98–111)
Creatinine, Ser: 0.71 mg/dL (ref 0.44–1.00)
GFR, Estimated: >60 mL/min (ref >60)
Glucose, Bld: 180 mg/dL — ABNORMAL HIGH (ref 70–99)
Potassium: 5 mmol/L (ref 3.5–5.1)
Sodium: 128 mmol/L — ABNORMAL LOW (ref 135–145)
Total Bilirubin: 1.6 mg/dL — ABNORMAL HIGH (ref 0.3–1.2)
Total Protein: 6.5 g/dL (ref 6.5–8.1)

## 2022-08-03 LAB — CBC WITH DIFFERENTIAL/PLATELET
Abs Immature Granulocytes: 0.72 10*3/uL — ABNORMAL HIGH (ref 0.00–0.07)
Basophils Absolute: 0 10*3/uL (ref 0.0–0.1)
Basophils Relative: 1 %
Eosinophils Absolute: 0 10*3/uL (ref 0.0–0.5)
Eosinophils Relative: 0 %
HCT: 48.5 % — ABNORMAL HIGH (ref 36.0–46.0)
Hemoglobin: 15.6 g/dL — ABNORMAL HIGH (ref 12.0–15.0)
Immature Granulocytes: 22 %
Lymphocytes Relative: 6 %
Lymphs Abs: 0.2 10*3/uL — ABNORMAL LOW (ref 0.7–4.0)
MCH: 29.1 pg (ref 26.0–34.0)
MCHC: 32.2 g/dL (ref 30.0–36.0)
MCV: 90.5 fL (ref 80.0–100.0)
Monocytes Absolute: 0 10*3/uL — ABNORMAL LOW (ref 0.1–1.0)
Monocytes Relative: 1 %
Neutro Abs: 2.3 10*3/uL (ref 1.7–7.7)
Neutrophils Relative %: 70 %
Platelets: 143 10*3/uL — ABNORMAL LOW (ref 150–400)
RBC: 5.36 MIL/uL — ABNORMAL HIGH (ref 3.87–5.11)
RDW: 14.2 % (ref 11.5–15.5)
WBC: 3.3 10*3/uL — ABNORMAL LOW (ref 4.0–10.5)
nRBC: 0 % (ref 0.0–0.2)

## 2022-08-03 LAB — MRSA NEXT GEN BY PCR, NASAL: MRSA by PCR Next Gen: NOT DETECTED

## 2022-08-03 LAB — LITHIUM LEVEL: Lithium Lvl: 0.97 mmol/L (ref 0.60–1.20)

## 2022-08-03 LAB — LACTIC ACID, PLASMA
Lactic Acid, Venous: 6.6 mmol/L (ref 0.5–1.9)
Lactic Acid, Venous: 3.2 mmol/L (ref 0.5–1.9)
Lactic Acid, Venous: 3.5 mmol/L (ref 0.5–1.9)
Lactic Acid, Venous: 4.6 mmol/L (ref 0.5–1.9)

## 2022-08-03 LAB — LIPASE, BLOOD: Lipase: 39 U/L (ref 11–51)

## 2022-08-03 LAB — RESP PANEL BY RT-PCR (FLU A&B, COVID) ARPGX2
Influenza A by PCR: NEGATIVE
Influenza B by PCR: NEGATIVE
SARS Coronavirus 2 by RT PCR: NEGATIVE

## 2022-08-03 MED ORDER — METRONIDAZOLE 500 MG/100ML IV SOLN
500.0000 mg | Freq: Once | INTRAVENOUS | Status: AC
  Administered 2022-08-03: 500 mg via INTRAVENOUS
  Filled 2022-08-03: qty 100

## 2022-08-03 MED ORDER — SENNA 8.6 MG PO TABS
2.0000 | ORAL_TABLET | Freq: Two times a day (BID) | ORAL | Status: DC
  Filled 2022-08-03: qty 2

## 2022-08-03 MED ORDER — FOLIC ACID 1 MG PO TABS: 1.0000 mg | ORAL_TABLET | Freq: Every day | ORAL | Status: DC

## 2022-08-03 MED ORDER — POLYETHYLENE GLYCOL 3350 17 G PO PACK: 17.0000 g | PACK | Freq: Every day | ORAL | Status: DC

## 2022-08-03 MED ORDER — SODIUM CHLORIDE (PF) 0.9 % IJ SOLN
INTRAMUSCULAR | Status: AC
  Filled 2022-08-03: qty 50

## 2022-08-03 MED ORDER — LACTATED RINGERS IV BOLUS
1000.0000 mL | Freq: Once | INTRAVENOUS | Status: AC
  Administered 2022-08-03: 1000 mL via INTRAVENOUS

## 2022-08-03 MED ORDER — OXYCODONE-ACETAMINOPHEN 5-325 MG PO TABS
1.0000 | ORAL_TABLET | Freq: Three times a day (TID) | ORAL | Status: DC | PRN
  Administered 2022-08-03: 1 via ORAL
  Filled 2022-08-03: qty 1

## 2022-08-03 MED ORDER — VENLAFAXINE HCL 75 MG PO TABS
75.0000 mg | ORAL_TABLET | Freq: Every day | ORAL | Status: DC
  Filled 2022-08-03: qty 1

## 2022-08-03 MED ORDER — IOHEXOL 300 MG/ML  SOLN
100.0000 mL | Freq: Once | INTRAMUSCULAR | Status: AC | PRN
  Administered 2022-08-03: 100 mL via INTRAVENOUS

## 2022-08-03 MED ORDER — SODIUM CHLORIDE 0.9 % IV SOLN
2.0000 g | Freq: Two times a day (BID) | INTRAVENOUS | Status: DC
  Administered 2022-08-03 – 2022-08-04 (×2): 2 g via INTRAVENOUS
  Filled 2022-08-03 (×2): qty 12.5

## 2022-08-03 MED ORDER — ORAL CARE MOUTH RINSE: 15.0000 mL | OROMUCOSAL | Status: DC | PRN

## 2022-08-03 MED ORDER — ACETAMINOPHEN 650 MG RE SUPP: 650.0000 mg | Freq: Four times a day (QID) | RECTAL | Status: DC | PRN

## 2022-08-03 MED ORDER — LITHIUM CARBONATE ER 300 MG PO TBCR
600.0000 mg | EXTENDED_RELEASE_TABLET | Freq: Every morning | ORAL | Status: DC
  Filled 2022-08-03: qty 2

## 2022-08-03 MED ORDER — THIAMINE MONONITRATE 100 MG PO TABS: 100.0000 mg | ORAL_TABLET | Freq: Every day | ORAL | Status: DC

## 2022-08-03 MED ORDER — ONDANSETRON HCL 4 MG/2ML IJ SOLN
4.0000 mg | Freq: Once | INTRAMUSCULAR | Status: AC
  Administered 2022-08-03: 4 mg via INTRAVENOUS
  Filled 2022-08-03: qty 2

## 2022-08-03 MED ORDER — LACTATED RINGERS IV SOLN: INTRAVENOUS | Status: AC

## 2022-08-03 MED ORDER — VANCOMYCIN HCL 500 MG/100ML IV SOLN
500.0000 mg | INTRAVENOUS | Status: DC
  Filled 2022-08-03: qty 100

## 2022-08-03 MED ORDER — IPRATROPIUM-ALBUTEROL 0.5-2.5 (3) MG/3ML IN SOLN
3.0000 mL | Freq: Four times a day (QID) | RESPIRATORY_TRACT | Status: DC
  Administered 2022-08-03 – 2022-08-04 (×3): 3 mL via RESPIRATORY_TRACT
  Filled 2022-08-03 (×3): qty 3

## 2022-08-03 MED ORDER — NORTRIPTYLINE HCL 25 MG PO CAPS
50.0000 mg | ORAL_CAPSULE | Freq: Every day | ORAL | Status: DC
Start: 2022-08-03 — End: 2022-08-04
  Filled 2022-08-03: qty 2

## 2022-08-03 MED ORDER — ENOXAPARIN SODIUM 30 MG/0.3ML IJ SOSY
30.0000 mg | PREFILLED_SYRINGE | INTRAMUSCULAR | Status: DC
  Administered 2022-08-03: 30 mg via SUBCUTANEOUS
  Filled 2022-08-03: qty 0.3

## 2022-08-03 MED ORDER — SODIUM CHLORIDE 0.9 % IV SOLN
2.0000 g | Freq: Once | INTRAVENOUS | Status: AC
  Administered 2022-08-03: 2 g via INTRAVENOUS
  Filled 2022-08-03: qty 12.5

## 2022-08-03 MED ORDER — LITHIUM CARBONATE ER 300 MG PO TBCR
300.0000 mg | EXTENDED_RELEASE_TABLET | Freq: Every day | ORAL | Status: DC
  Filled 2022-08-03: qty 1

## 2022-08-03 MED ORDER — ENSURE ENLIVE PO LIQD
237.0000 mL | Freq: Two times a day (BID) | ORAL | Status: DC
  Administered 2022-08-04: 237 mL via ORAL

## 2022-08-03 MED ORDER — MORPHINE SULFATE (PF) 4 MG/ML IV SOLN
4.0000 mg | Freq: Once | INTRAVENOUS | Status: AC
  Administered 2022-08-03: 4 mg via INTRAVENOUS
  Filled 2022-08-03: qty 1

## 2022-08-03 MED ORDER — VANCOMYCIN HCL IN DEXTROSE 1-5 GM/200ML-% IV SOLN: 1000.0000 mg | Freq: Once | INTRAVENOUS | Status: DC

## 2022-08-03 MED ORDER — GUAIFENESIN ER 600 MG PO TB12
600.0000 mg | ORAL_TABLET | Freq: Two times a day (BID) | ORAL | Status: DC
  Filled 2022-08-03: qty 1

## 2022-08-03 MED ORDER — ACETAMINOPHEN 325 MG PO TABS: 650.0000 mg | ORAL_TABLET | Freq: Four times a day (QID) | ORAL | Status: DC | PRN

## 2022-08-03 MED ORDER — CHLORHEXIDINE GLUCONATE CLOTH 2 % EX PADS
6.0000 | MEDICATED_PAD | Freq: Every day | CUTANEOUS | Status: DC
  Administered 2022-08-03 – 2022-08-04 (×2): 6 via TOPICAL

## 2022-08-03 MED ORDER — VANCOMYCIN HCL 750 MG/150ML IV SOLN
750.0000 mg | Freq: Once | INTRAVENOUS | Status: AC
  Administered 2022-08-03: 750 mg via INTRAVENOUS
  Filled 2022-08-03: qty 150

## 2022-08-03 NOTE — ED Provider Notes (Signed)
I provided a substantive portion of the care of this patient.  I personally performed the entirety of the medical decision making for this encounter.      71 year old female presents with shortness of breath.  History of lung CA.  Also has abdominal discomfort.  Patient is COVID and flu negative.  CT angio negative for ischemic bowel but does show likely pneumonia.  Patient severely elevated lactate.  Sepsis protocol started.  Patient will be given antibiotics and will require admission.   Lacretia Leigh, MD 08/21/2022 1415

## 2022-08-03 NOTE — H&P (Signed)
History and Physical    Patient: Catherine Kelley TIW:[id number] DOB: 1951-05-17 DOA: 08/27/2022 DOS: the patient was seen and examined on 08/17/2022 PCP: Maximiano Coss, NP  Patient coming from: Home  Chief Complaint:  Chief Complaint  Patient presents with   Abdominal Pain   leg infection    Shortness of Breath   HPI: Catherine Kelley is a 71 y.o. female with medical history significant of S4 adenocarcinoma of the lung, bipolar d/o, bronchiectasis. Presenting with abdominal pain and shortness of breath. History is per husband. He reports she finished radiation over a week ago and then had a dose of chemo last week. Since that time, the patient has had poor PO intake and complained of abdominal pain. Her normal pain meds have not been helpful. This morning she was short of breath. No fevers noted or sick contacts. When her symptoms did not improve, she came to the ED for evaluation.     Review of Systems: As mentioned in the history of present illness. All other systems reviewed and are negative. Past Medical History:  Diagnosis Date   Anxiety    Arthritis    "joints" (09/18/2017)   Asthma    Bipolar I disorder ([place]) 10/11/2019   CAP (community acquired pneumonia) 09/18/2017   Cat allergies    Chronic bronchitis ([place])    "get it alot; maybe not q yr" (07/06/2014)   Chronic lower back pain    "spurs and pinched nerves" (09/18/2017)   COPD (chronic obstructive pulmonary disease) ([place])    DDD (degenerative disc disease), lumbar    Depression    psychiatrist Dr. Toy Care   Diastolic dysfunction    Dyspnea    Emphysematous COPD ([place])    changes in right base along with patchy areas on last x-ray   Fall as cause of accidental injury at home as place of occurrence 10/11/2019   occasional use of walker   Fall involving wheelchair as cause of accidental injury 10/11/2019   Falls frequently    "several in 2017; fell at dr's office yesterday" (09/18/2017)   GERD (gastroesophageal reflux disease)     HCAP (healthcare-associated pneumonia) 02/12/2016   Heart murmur    History of cardiovascular stress test 08/15/2004   EF of 70% -- Normal stress cardiolite.  There is no evidence of ischemia and there is normal LV function. -- [name]. MD   History of echocardiogram 12/24/2006   Est. EF of 25-05% NORMAL LV SYSTOLIC FUNCTION WITH IMPAIRED RELAXATION -- MILD AORTIC SCLEROSIS -- NORMAL PALONARY ARTERY PRESSURE -- NO OLD ECHOS FOR COMPARISON -- [name], MD   Hypertension    essential hypertension   Migraine    "when I was having my periods; none since then" (09/18/2017)   Necrotic pneumonia ([place])    recurrent/notes 02/12/2016   Pneumonia    "several times since May 2015" (07/06/2014)   Pollen allergies    Tobacco abuse    smoking about 1/2 pk of cigarettes a day   Tobacco dependence due to cigarettes 10/11/2019   Traumatic hematoma of knee, right, initial encounter 10/11/2019   Past Surgical History:  Procedure Laterality Date   BIOPSY  04/28/2022   Procedure: BIOPSY;  Surgeon: Carol Ada, MD;  Location: [facility];  Service: Gastroenterology;;   BRONCHIAL BIOPSY  06/09/2022   Procedure: BRONCHIAL BIOPSIES;  Surgeon: [name], MD;  Location: [facility] ENDOSCOPY;  Service: Pulmonary;;   BRONCHIAL BRUSHINGS  06/09/2022   Procedure: BRONCHIAL BRUSHINGS;  Surgeon: Baltazar Apo  S, MD;  Location: [facility];  Service: Pulmonary;;   BRONCHIAL NEEDLE ASPIRATION BIOPSY  06/09/2022   Procedure: BRONCHIAL NEEDLE ASPIRATION BIOPSIES;  Surgeon: [name], MD;  Location: [facility] ENDOSCOPY;  Service: Pulmonary;;   BRONCHIAL WASHINGS  06/09/2022   Procedure: BRONCHIAL WASHINGS;  Surgeon: [name], MD;  Location: [facility] ENDOSCOPY;  Service: Pulmonary;;   CAROTID STENT     COLONOSCOPY N/A 04/28/2022   Procedure: COLONOSCOPY;  Surgeon: Carol Ada, MD;  Location: [facility] ENDOSCOPY;  Service: Gastroenterology;  Laterality: N/A;   DILATION AND CURETTAGE OF UTERUS     FOREIGN BODY  REMOVAL  04/28/2022   Procedure: FOREIGN BODY REMOVAL;  Surgeon: Carol Ada, MD;  Location: [facility];  Service: Gastroenterology;;  Fecal Impaction Removed   TUBAL LIGATION  1986   VIDEO BRONCHOSCOPY WITH ENDOBRONCHIAL ULTRASOUND N/A 06/09/2022   Procedure: VIDEO BRONCHOSCOPY WITH ENDOBRONCHIAL ULTRASOUND;  Surgeon: [name], MD;  Location: [facility] ENDOSCOPY;  Service: Pulmonary;  Laterality: N/A;   VIDEO BRONCHOSCOPY WITH RADIAL ENDOBRONCHIAL ULTRASOUND  06/09/2022   Procedure: VIDEO BRONCHOSCOPY WITH RADIAL ENDOBRONCHIAL ULTRASOUND;  Surgeon: [name], MD;  Location: [facility];  Service: Pulmonary;;   Social History:  reports that she has been smoking cigarettes. She has a 38.25 pack-year smoking history. She has never used smokeless tobacco. She reports current alcohol use of about 1.0 standard drink of alcohol per week. She reports that she does not use drugs.  Allergies  Allergen Reactions   Sulfa Drugs Cross Reactors Hives and Rash   Ceclor [Cefaclor] Hives    Tolerated ceftazidime December 2016   Depakote Er [Divalproex Sodium Er] Other (See Comments)    Patient remarked that when she was taking this TWICE a day, she became "clumsy" and felt more prone to stumbling   Escitalopram Oxalate Other (See Comments)    Pt does not recall ever taking medication (??)   Latex Itching   Other Other (See Comments)    Hydrocort Cream  Unknown   Sertraline Hcl Other (See Comments)    Severe headaches    Tape Itching and Rash    Band-Aids, also (PAPER TAPE IS TOLERATED)    Family History  Problem Relation Age of Onset   Stroke Mother    Heart disease Father    Hyperlipidemia Father    Hypertension Father    Cancer Maternal Aunt        breast cancer   Breast cancer Maternal Aunt    Asthma Maternal Grandmother     Prior to Admission medications   Medication Sig Start Date End Date Taking? Authorizing Provider  albuterol (VENTOLIN HFA) 108 (90 Base) MCG/ACT inhaler  Inhale 2 puffs into the lungs every 6 (six) hours as needed for shortness of breath. 05/23/22  Yes Dewald, Cheryle Horsfall, MD  amLODipine (NORVASC) 2.5 MG tablet Take 1 tablet (2.5 mg total) by mouth daily. 04/29/22  Yes Bonnielee Haff, MD  folic acid (FOLVITE) 1 MG tablet Take 1 tablet (1 mg total) by mouth daily. 04/29/22  Yes Bonnielee Haff, MD  lithium carbonate (LITHOBID) 300 MG CR tablet Take 600 mg in am and 300 at bed time. 05/09/22  Yes Arfeen, Arlyce Harman, MD  nortriptyline (PAMELOR) 50 MG capsule Take 1 capsule (50 mg total) by mouth at bedtime. 05/09/22  Yes Arfeen, Arlyce Harman, MD  oxyCODONE-acetaminophen (PERCOCET/ROXICET) 5-325 MG tablet Take 1 tablet by mouth every 8 (eight) hours as needed for severe pain. 07/08/22  Yes Heilingoetter, Cassandra L, PA-C  polyethylene  glycol (MIRALAX / GLYCOLAX) 17 g packet Take 17 g by mouth daily. 04/29/22  Yes Bonnielee Haff, MD  prochlorperazine (COMPAZINE) 10 MG tablet Take 1 tablet (10 mg total) by mouth every 6 (six) hours as needed. 07/08/22  Yes Heilingoetter, Cassandra L, PA-C  senna (SENOKOT) 8.6 MG TABS tablet Take 2 tablets (17.2 mg total) by mouth 2 (two) times daily. 04/29/22  Yes Bonnielee Haff, MD  thiamine 100 MG tablet Take 1 tablet (100 mg total) by mouth daily. 04/29/22  Yes Bonnielee Haff, MD  venlafaxine (EFFEXOR) 75 MG tablet Take 1 tablet (75 mg total) by mouth daily. 05/09/22 05/09/23 Yes Arfeen, Arlyce Harman, MD  Vitamin D, Ergocalciferol, (DRISDOL) 1.25 MG (50000 UNIT) CAPS capsule TAKE 1 CAPSULE (50,000 UNITS TOTAL) BY MOUTH EVERY 7 (SEVEN) DAYS 06/02/22  Yes Maximiano Coss, NP  cetirizine (ZYRTEC) 10 MG tablet Take 1 tablet (10 mg total) by mouth daily. Patient not taking: Reported on 06/26/2022 02/15/21   Maximiano Coss, NP  folic acid (FOLVITE) 1 MG tablet Take 1 tablet (1 mg total) by mouth daily. Patient not taking: Reported on 08/28/2022 07/08/22   Heilingoetter, Cassandra L, PA-C  spironolactone (ALDACTONE) 50 MG tablet TAKE 1 TABLET BY MOUTH EVERY  DAY Patient not taking: Reported on 06/26/2022 05/19/22   Maximiano Coss, NP    Physical Exam: Vitals:   08/02/2022 1206 08/28/2022 1215 08/24/2022 1230 08/25/2022 1430  BP: (!) 151/89 (!) 140/91 131/76 112/72  Pulse: (!) 114 (!) 127 (!) 117 (!) 107  Resp: (!) 27 (!) 25 (!) 22 15  Temp: 97.6 F (36.4 C)     TempSrc: Oral     SpO2: 93% (!) 56% 92% 91%   General: 71 y.o. chronically cachectic ill appearing female resting in bed  Eyes: PERRL, normal sclera ENMT: Nares patent w/o discharge, orophaynx clear, dentition normal, ears w/o discharge/lesions/ulcers Neck: thin, trachea midline Cardiovascular: tachy, +S1, S2, no m/g/r, equal pulses throughout Respiratory: diffuse rhonchi, increased WOB GI: BS+, ND, TTP LLQ/LUQ, no masses noted, no organomegaly noted MSK: No e/c/c Neuro: A&O x 3, no focal deficits Psyc: Appropriate interaction and affect, calm/cooperative  Data Reviewed:  Lab Results  Component Value Date   NA 128 (L) 08/09/2022   K 5.0 08/07/2022   CO2 21 (L) 08/30/2022   GLUCOSE 180 (H) 08/17/2022   BUN 19 08/29/2022   CREATININE 0.71 08/20/2022   CALCIUM 9.2 08/13/2022   GFRNONAA >60 08/01/2022   Lab Results  Component Value Date   WBC 3.3 (L) 08/09/2022   HGB 15.6 (H) 08/12/2022   HCT 48.5 (H) 08/26/2022   MCV 90.5 08/25/2022   PLT 143 (L) 08/11/2022   CT Chest/ab/pelvis w/ 1. Lung findings are similar to the recent prior exam with extensive cystic and varicose bronchiectasis, with mucous plugging. There are peripheral lung opacities in the right upper lobe, which are similar to the most recent prior CT, which may relate may be infectious/inflammatory in etiology or neoplastic. Mild mediastinal lymphadenopathy is also stable. No acute findings in the lungs. 2. Sclerotic changes of the lower thoracic and upper to mid lumbar spine, without change. This was hypermetabolic on the prior PET-CT suspected to be metastatic disease. No osteolytic lesions. 3. Liver  morphologic changes consistent with cirrhosis. No liver mass. 4. Small amount of ascites which is new compared to the prior abdomen and pelvis CT. 5. No bowel obstruction or inflammation. Mild increase in the left colon stool burden and fluid within the right colon, nonspecific.  EKG: sinus  tach, no st elevations  Assessment and Plan: Sepsis secondary to RUL PNA Bronchiectasis w/  mucus plugging     - admit to inpt, SDU     - continue broad spec abx for now     - follow bld cx     - continue fluids     - add guaifenesin, FV, chest pt, nebs     - COVID/flu negative  Abdominal pain  Constipation     - some constipation noted on imaging, but negative for abdominal source otherwise     - BM regimen, pain control  Hx of S4 adenocarcinoma of lung     - on chemo w/ Dr. Julien Nordmann. Added to care team  Severe protein-calorie malnutrition     - dietitian consult  Bipolar disorder     - continue home regimen  HTN     - hold home regimen for today  Hyponatremia     - chronic     - continue fluids  Leukopenia Thrombocytopenia     - likely secondary to radiation/chemo     - monitor  Advance Care Planning:   Code Status: FULL  Consults: Oncology  Family Communication: w/ husband at bedside  Severity of Illness: The appropriate patient status for this patient is INPATIENT. Inpatient status is judged to be reasonable and necessary in order to provide the required intensity of service to ensure the patient's safety. The patient's presenting symptoms, physical exam findings, and initial radiographic and laboratory data in the context of their chronic comorbidities is felt to place them at high risk for further clinical deterioration. Furthermore, it is not anticipated that the patient will be medically stable for discharge from the hospital within 2 midnights of admission.   * I certify that at the point of admission it is my clinical judgment that the patient will require inpatient  hospital care spanning beyond 2 midnights from the point of admission due to high intensity of service, high risk for further deterioration and high frequency of surveillance required.*  Time spent in coordination of this H&P: 50 minutes   Author: Jonnie Finner, DO 08/12/2022 2:59 PM  For on call review [email or web address].

## 2022-08-03 NOTE — ED Notes (Signed)
Patient transported to CT 

## 2022-08-03 NOTE — ED Provider Notes (Signed)
[facility] DEPT Provider Note   CSN: [id number] Arrival date & time: 08/27/2022  1146     History  Chief Complaint  Patient presents with   Abdominal Pain   leg infection    Shortness of Breath    Catherine Kelley is a 71 y.o. female with past medical history significant for COPD, emphysema, tobacco abuse, hypertension, anxiety, depression, bipolar disorder, with adenocarcinoma of right lung stage IV who presents with concern for an severe onset abdominal pain, nausea, constipation since this morning, she denies significant change in shortness of breath but is hypoxic in triage with O2 sat at 89% on room air.  She does not require oxygen at baseline.  Patient is also concerned about a possible leg infection, reports that she had an injury to the left lower leg posterior aspect on Friday.  Patient recently began chemotherapy 1 week ago, and finished radiation for her cancer 1 week ago.  She denies any chest pain, does endorse fever, chills but reports that she has not taken her temperature at home.   Abdominal Pain Associated symptoms: shortness of breath   Shortness of Breath Associated symptoms: abdominal pain        Home Medications Prior to Admission medications   Medication Sig Start Date End Date Taking? Authorizing Provider  albuterol (VENTOLIN HFA) 108 (90 Base) MCG/ACT inhaler Inhale 2 puffs into the lungs every 6 (six) hours as needed for shortness of breath. 05/23/22  Yes Dewald, Cheryle Horsfall, MD  amLODipine (NORVASC) 2.5 MG tablet Take 1 tablet (2.5 mg total) by mouth daily. 04/29/22  Yes Bonnielee Haff, MD  folic acid (FOLVITE) 1 MG tablet Take 1 tablet (1 mg total) by mouth daily. 04/29/22  Yes Bonnielee Haff, MD  lithium carbonate (LITHOBID) 300 MG CR tablet Take 600 mg in am and 300 at bed time. 05/09/22  Yes Arfeen, Arlyce Harman, MD  nortriptyline (PAMELOR) 50 MG capsule Take 1 capsule (50 mg total) by mouth at bedtime. 05/09/22  Yes Arfeen, Arlyce Harman, MD   oxyCODONE-acetaminophen (PERCOCET/ROXICET) 5-325 MG tablet Take 1 tablet by mouth every 8 (eight) hours as needed for severe pain. 07/08/22  Yes Heilingoetter, Cassandra L, PA-C  polyethylene glycol (MIRALAX / GLYCOLAX) 17 g packet Take 17 g by mouth daily. 04/29/22  Yes Bonnielee Haff, MD  prochlorperazine (COMPAZINE) 10 MG tablet Take 1 tablet (10 mg total) by mouth every 6 (six) hours as needed. 07/08/22  Yes Heilingoetter, Cassandra L, PA-C  senna (SENOKOT) 8.6 MG TABS tablet Take 2 tablets (17.2 mg total) by mouth 2 (two) times daily. 04/29/22  Yes Bonnielee Haff, MD  thiamine 100 MG tablet Take 1 tablet (100 mg total) by mouth daily. 04/29/22  Yes Bonnielee Haff, MD  venlafaxine (EFFEXOR) 75 MG tablet Take 1 tablet (75 mg total) by mouth daily. 05/09/22 05/09/23 Yes Arfeen, Arlyce Harman, MD  Vitamin D, Ergocalciferol, (DRISDOL) 1.25 MG (50000 UNIT) CAPS capsule TAKE 1 CAPSULE (50,000 UNITS TOTAL) BY MOUTH EVERY 7 (SEVEN) DAYS 06/02/22  Yes Maximiano Coss, NP  cetirizine (ZYRTEC) 10 MG tablet Take 1 tablet (10 mg total) by mouth daily. Patient not taking: Reported on 06/26/2022 02/15/21   Maximiano Coss, NP  folic acid (FOLVITE) 1 MG tablet Take 1 tablet (1 mg total) by mouth daily. Patient not taking: Reported on 08/10/2022 07/08/22   Heilingoetter, Cassandra L, PA-C  spironolactone (ALDACTONE) 50 MG tablet TAKE 1 TABLET BY MOUTH EVERY DAY Patient not taking: Reported on 06/26/2022 05/19/22   Orland Mustard,  Richard, NP      Allergies    Sulfa drugs cross reactors, Ceclor [cefaclor], Depakote er [divalproex sodium er], Escitalopram oxalate, Latex, Other, Sertraline hcl, and Tape    Review of Systems   Review of Systems  Respiratory:  Positive for shortness of breath.   Gastrointestinal:  Positive for abdominal pain.  All other systems reviewed and are negative.   Physical Exam Updated Vital Signs BP (!) 117/98   Pulse (!) 113   Temp 97.6 F (36.4 C) (Oral)   Resp 18   SpO2 (!) 89%  Physical Exam Vitals  and nursing note reviewed.  Constitutional:      General: She is in acute distress.     Appearance: Normal appearance. She is ill-appearing.     Comments: Patient is thin to the point of cachexia throughout  HENT:     Head: Normocephalic and atraumatic.     Mouth/Throat:     Mouth: Mucous membranes are dry.  Eyes:     General:        Right eye: No discharge.        Left eye: No discharge.  Cardiovascular:     Rate and Rhythm: Regular rhythm. Tachycardia present.     Heart sounds: No murmur heard.    No friction rub. No gallop.  Pulmonary:     Effort: Pulmonary effort is normal.     Breath sounds: Normal breath sounds.     Comments: Increased work of breathing throughout, no wheezing, rhonchi, rales, questionable focal consolidation on the right. Abdominal:     General: Bowel sounds are normal.     Palpations: Abdomen is soft.     Comments: Patient is quite tender throughout the entire abdomen without distention, some guarding but no rebound, rigidity.  Skin:    General: Skin is warm and dry.     Capillary Refill: Capillary refill takes less than 2 seconds.     Comments: Patient does have a small laceration versus abrasion on the posterior left lower leg.  To me it does not appear to have signs of secondary infection, no tracking redness, purulent drainage, or excessive tenderness palpation.  Neurological:     Mental Status: She is alert and oriented to person, place, and time.  Psychiatric:        Mood and Affect: Mood normal.        Behavior: Behavior normal.     ED Results / Procedures / Treatments   Labs (all labs ordered are listed, but only abnormal results are displayed) Labs Reviewed  LACTIC ACID, PLASMA - Abnormal; Notable for the following components:      Result Value   Lactic Acid, Venous 6.6 (*)    All other components within normal limits  LACTIC ACID, PLASMA - Abnormal; Notable for the following components:   Lactic Acid, Venous 3.2 (*)    All other  components within normal limits  CBC WITH DIFFERENTIAL/PLATELET - Abnormal; Notable for the following components:   WBC 3.3 (*)    RBC 5.36 (*)    Hemoglobin 15.6 (*)    HCT 48.5 (*)    Platelets 143 (*)    Lymphs Abs 0.2 (*)    Monocytes Absolute 0.0 (*)    Abs Immature Granulocytes 0.72 (*)    All other components within normal limits  COMPREHENSIVE METABOLIC PANEL - Abnormal; Notable for the following components:   Sodium 128 (*)    Chloride 91 (*)    CO2 21 (*)  Glucose, Bld 180 (*)    Albumin 2.8 (*)    Alkaline Phosphatase 150 (*)    Total Bilirubin 1.6 (*)    Anion gap 16 (*)    All other components within normal limits  RESP PANEL BY RT-PCR (FLU A&B, COVID) ARPGX2  CULTURE, BLOOD (ROUTINE X 2)  CULTURE, BLOOD (ROUTINE X 2)  LIPASE, BLOOD  LITHIUM LEVEL  URINALYSIS, ROUTINE W REFLEX MICROSCOPIC    EKG None  Radiology DG Chest Portable 1 View  Result Date: 08/05/2022 CLINICAL DATA:  Shortness of breath. Lung infection and abdominal pain. Abnormal PET scan and chest CTs. EXAM: PORTABLE CHEST 1 VIEW COMPARISON:  CT chest 08/08/2022 and 06/06/2022. FINDINGS: The heart size is normal. Atherosclerotic calcifications are present at the aortic arch. Diffuse interstitial changes are again noted. Masslike densities bilaterally are better described on CT. Most prominent areas in the right upper lobe. Bilateral bronchiectasis is better seen in the lower lobes. IMPRESSION: 1. Bilateral masses are better described on CT. Given assistant appearance and avid uptake on PET scan, these remain concerning for neoplasm. 2. Diffuse interstitial changes with bilateral bronchiectasis. 3. Aortic atherosclerosis. 4. No acute cardiopulmonary disease or significant interval change compared to the recent CT. Electronically Signed   By: San Morelle M.D.   On: 08/08/2022 14:13   CT CHEST ABDOMEN PELVIS W CONTRAST  Result Date: 08/26/2022 CLINICAL DATA:  Abdominal pain. Leg infection.  Shortness of breath. Concern for sepsis. EXAM: CT CHEST, ABDOMEN, AND PELVIS WITH CONTRAST TECHNIQUE: Multidetector CT imaging of the chest, abdomen and pelvis was performed following the standard protocol during bolus administration of intravenous contrast. RADIATION DOSE REDUCTION: This exam was performed according to the departmental dose-optimization program which includes automated exposure control, adjustment of the mA and/or kV according to patient size and/or use of iterative reconstruction technique. CONTRAST:  141mL OMNIPAQUE IOHEXOL 300 MG/ML  SOLN COMPARISON:  06/06/2022, chest CT. PET-CT, 05/16/2022. Chest, abdomen and pelvis CT, 04/23/2022. FINDINGS: CT CHEST FINDINGS Cardiovascular: Heart normal in size. No pericardial effusion. Mild left coronary artery calcifications. Great vessels are normal caliber. Mild aortic atherosclerosis. Mediastinum/Nodes: No neck base, mediastinal or hilar masses. Borderline enlarged right paratracheal, ascus level node measuring 1 cm short axis. Prominent subcarinal node measuring 1.1 cm short axis. Nodes are stable. Trachea and esophagus are unremarkable. Lungs/Pleura: Bilateral bronchiectasis, most apparent in the right lower lobe and right middle lobe where it is cystic, with multiple bronchi demonstrating air-fluid levels or being fluid/mucous filled. Masslike area opacity in the peripheral right upper lobe, centered on image 65, series 4, measuring 2.8 x 1.7 x 1.8 cm, similar to the prior chest CT. Second similar area of focal opacity in the posterolateral right upper lobe, centered on image 56, series 4, measuring 3.4 x 1.6 x 2.3 cm, also without significant change. Focal peripheral opacities in with associated cystic spaces/bronchiectasis in anteromedial right upper lobe and anterior left upper lobe. Small ill-defined nodular opacities with multiple tree-in-bud opacities noted bilaterally most evident in the left lower lobe. Moderate centrilobular emphysema. No  pleural effusion.  No pneumothorax. Musculoskeletal: No fracture or acute finding. Dense sclerosis noted in the T11, T12, L1 and L2 vertebra and posterior elements from the lower thoracic through the lumbar spine. These findings are stable. CT ABDOMEN PELVIS FINDINGS Hepatobiliary: Liver with central volume loss and subtle surface nodularity. No liver mass. Unremarkable gallbladder. No bile duct dilation. Pancreas: Unremarkable. No pancreatic ductal dilatation or surrounding inflammatory changes. Spleen: Normal in size without focal abnormality. Adrenals/Urinary  Tract: Mild adrenal thickening, stable consistent with hyperplasia. Kidneys normal in size, orientation and position. Tiny low-attenuation lesion, anterior midpole the left kidney consistent with a cyst. No follow-up recommended. No stones. No hydronephrosis. Ureters not well visualized but grossly normal in course and in caliber. Bladder decompressed. Stomach/Bowel: Stomach mostly decompressed. Mucosa shows relative increased enhancement. Wall thickening evident of the antrum. No mass. Small bowel is normal in caliber. No wall thickening or evidence of inflammation. Colon normal in caliber. No wall thickening or convincing inflammation. Mild increased stool burden left colon. Fluid noted in the right colon. Vascular/Lymphatic: Aortic atherosclerosis. No aneurysm. No enlarged lymph nodes. Reproductive: Uterus and bilateral adnexa are unremarkable. Other: Small amount of ascites. Musculoskeletal: Sclerotic changes of the lower thoracic and upper to middle lumbar spine as noted on the chest portion. No osteolytic lesions. Degenerative changes of the lower lumbar spine with a grade 1 anterolisthesis of L4 on L5. No fracture. IMPRESSION: 1. Lung findings are similar to the recent prior exam with extensive cystic and varicose bronchiectasis, with mucous plugging. There are peripheral lung opacities in the right upper lobe, which are similar to the most recent  prior CT, which may relate may be infectious/inflammatory in etiology or neoplastic. Mild mediastinal lymphadenopathy is also stable. No acute findings in the lungs. 2. Sclerotic changes of the lower thoracic and upper to mid lumbar spine, without change. This was hypermetabolic on the prior PET-CT suspected to be metastatic disease. No osteolytic lesions. 3. Liver morphologic changes consistent with cirrhosis. No liver mass. 4. Small amount of ascites which is new compared to the prior abdomen and pelvis CT. 5. No bowel obstruction or inflammation. Mild increase in the left colon stool burden and fluid within the right colon, nonspecific. Electronically Signed   By: Lajean Manes M.D.   On: 08/29/2022 14:00    Procedures .Critical Care  Performed by: Anselmo Pickler, PA-C Authorized by: Anselmo Pickler, PA-C   Critical care provider statement:    Critical care time (minutes):  35   Critical care was necessary to treat or prevent imminent or life-threatening deterioration of the following conditions:  Sepsis   Critical care was time spent personally by me on the following activities:  Development of treatment plan with patient or surrogate, discussions with consultants, evaluation of patient's response to treatment, examination of patient, ordering and review of laboratory studies, ordering and review of radiographic studies, ordering and performing treatments and interventions, pulse oximetry, re-evaluation of patient's condition and review of old charts   Care discussed with: admitting provider       Medications Ordered in ED Medications  lactated ringers infusion ( Intravenous New Bag/Given 08/26/2022 1344)  vancomycin (VANCOREADY) IVPB 750 mg/150 mL (750 mg Intravenous New Bag/Given 08/23/2022 1538)  lactated ringers bolus 1,000 mL (0 mLs Intravenous Stopped 08/12/2022 1340)  morphine (PF) 4 MG/ML injection 4 mg (4 mg Intravenous Given 08/11/2022 1241)  ondansetron (ZOFRAN) injection 4 mg (4 mg  Intravenous Given 08/13/2022 1241)  ceFEPIme (MAXIPIME) 2 g in sodium chloride 0.9 % 100 mL IVPB (0 g Intravenous Stopped 08/06/2022 1416)  metroNIDAZOLE (FLAGYL) IVPB 500 mg (0 mg Intravenous Stopped 08/02/2022 1532)  lactated ringers bolus 1,000 mL (0 mLs Intravenous Stopped 08/11/2022 1449)  iohexol (OMNIPAQUE) 300 MG/ML solution 100 mL (100 mLs Intravenous Contrast Given 08/13/2022 1326)  sodium chloride (PF) 0.9 % injection (  Given by Other 08/06/2022 1402)    ED Course/ Medical Decision Making/ A&P Clinical Course as of 08/06/2022 1600  Sun Aug 03, 2022  1551 CT CHEST ABDOMEN PELVIS W CONTRAST [CP]    Clinical Course User Index [CP] Anselmo Pickler, PA-C                           Medical Decision Making Amount and/or Complexity of Data Reviewed Labs: ordered. Radiology: ordered.  Risk Prescription drug management.   This patient is a 71 y.o. female who presents to the ED for concern of abdominal pain, shortness of breath, concern for possible leg infection, this involves an extensive number of treatment options, and is a complaint that carries with it a high risk of complications and morbidity. The emergent differential diagnosis prior to evaluation includes, but is not limited to, cellulitis, sepsis, osteomyelitis, abdominal pain could be acute mesenteric ischemia, cholecystitis, diverticulitis, appendicitis, bowel rupture, considered pneumonia, UTI, versus other.   This is not an exhaustive differential.   Past Medical History / Co-morbidities / Social History: COPD, emphysema, tobacco abuse, hypertension, anxiety, depression, bipolar disorder, with adenocarcinoma of right lung stage IV  Additional history: Chart reviewed. Pertinent results include: Reviewed outpatient oncology visits, patient finished radiation a week ago, began chemotherapy a week ago . Physical Exam: Physical exam performed. The pertinent findings include: Patient is clinically dehydrated, thin to the point today, she  has tenderness palpation throughout the abdomen without any focal location of tenderness, she does have some lower consolidation noted on the right, and is hypoxic on room air, requiring 2 L nasal cannula.  She is quite tachycardic on arrival 121, normal improvement over the 110s range after fluid rehydration.  Lab Tests: I ordered, and personally interpreted labs.  The pertinent results include: Patient with hyponatremia, sodium 128, she is minimally acidotic with CO2 21, anion gap of 16, new elevation of total bilirubin at 1.6, elevation of alkaline phosphatase at 150.  Glucose minimally elevated at 180.  Lipase negative, lithium level normal.  Initial lactic acid is considerably elevated at 6.6, improved to 3.2 after fluid rehydration.  CBC is remarkable for leukocytopenia, along with some elevated hemoglobin likely secondary to hemoconcentration, but her leukocytopenia is likely in context of her recent cancer treatment and is masking a new infection given her clinical presentation.   Imaging Studies: I ordered imaging studies including CT chest abdomen pelvis with contrast, one view CXR. I independently visualized and interpreted imaging which showed Lung findings are similar to the recent prior exam with extensive cystic and varicose bronchiectasis, with mucous plugging. There are peripheral lung opacities in the right upper lobe, which are similar to the most recent prior CT, which may relate may be infectious/inflammatory in etiology or neoplastic. Mild mediastinal lymphadenopathy is also stable. No acute findings in the lungs. 2. Sclerotic changes of the lower thoracic and upper to mid lumbar spine, without change. This was hypermetabolic on the prior PET-CT suspected to be metastatic disease. No osteolytic lesions. 3. Liver morphologic changes consistent with cirrhosis. No liver mass. 4. Small amount of ascites which is new compared to the prior abdomen and pelvis CT. 5. No bowel obstruction or  inflammation. Mild increase in the left colon stool burden and fluid within the right colon, nonspecific. I agree with the radiologist interpretation.   Cardiac Monitoring:  The patient was maintained on a cardiac monitor.  My attending physician Dr. Zenia Resides viewed and interpreted the cardiac monitored which showed an underlying rhythm of: sinus tachycardia. I agree with this interpretation.   Medications: I ordered medication  including fluid bolus, broad-spectrum antibiotics, Zofran, morphine for pain sepsis, aggressive fluid rehydration. Reevaluation of the patient after these medicines showed that the patient improved.  Patient with improvement of her tachycardia somewhat, as well as lactic acid after aggressive fluid rehydration.  Consultations Obtained: I requested consultation with the hospitalist, spoke with Dr. Marylyn Ishihara,  and discussed lab and imaging findings as well as pertinent plan - they recommend: Admission to the hospital for sepsis   Disposition: After consideration of the diagnostic results and the patients response to treatment, I feel that patient would benefit from admission .   I discussed this case with my attending physician Dr. Zenia Resides who cosigned this note including patient's presenting symptoms, physical exam, and planned diagnostics and interventions. Attending physician stated agreement with plan or made changes to plan which were implemented.    Final Clinical Impression(s) / ED Diagnoses Final diagnoses:  None    Rx / DC Orders ED Discharge Orders     None         Dorien Chihuahua 08/30/2022 1600    Lacretia Leigh, MD 08/11/22 1014

## 2022-08-03 NOTE — Progress Notes (Signed)
Pharmacy Antibiotic Note  Catherine Kelley is a 71 y.o. female admitted on 08/28/2022 with Sepsis secondary to RUL PNA and lung cancer on chemo/radiation.  Pharmacy has been consulted for Cefepime and vancomycin dosing.  Plan: Cefepime 2g IV q12h Vancomycin 500mg  IV q24h (Est AUC 498 using rounded SCr 0.8, TBW < IBW)  Measure Vanc levels as needed.  Goal AUC = 400 - 550. Follow up renal function, culture results, and clinical course.      Temp (24hrs), Avg:97.6 F (36.4 C), Min:97.6 F (36.4 C), Max:97.6 F (36.4 C)  Recent Labs  Lab 07/28/22 0811 08/05/2022 1225 08/09/2022 1230 08/17/2022 1457  WBC 15.3* 3.3*  --   --   CREATININE <0.30* 0.71  --   --   LATICACIDVEN  --   --  6.6* 3.2*    Estimated Creatinine Clearance: 38.8 mL/min (by C-G formula based on SCr of 0.71 mg/dL).    Allergies  Allergen Reactions   Sulfa Drugs Cross Reactors Hives and Rash   Ceclor [Cefaclor] Hives    Tolerated ceftazidime December 2016   Depakote Er [Divalproex Sodium Er] Other (See Comments)    Patient remarked that when she was taking this TWICE a day, she became "clumsy" and felt more prone to stumbling   Escitalopram Oxalate Other (See Comments)    Pt does not recall ever taking medication (??)   Latex Itching   Other Other (See Comments)    Hydrocort Cream  Unknown   Sertraline Hcl Other (See Comments)    Severe headaches    Tape Itching and Rash    Band-Aids, also (PAPER TAPE IS TOLERATED)      Thank you for allowing pharmacy to be a part of this patient's care.  Gretta Arab PharmD, BCPS Clinical Pharmacist [facility] main pharmacy [phone] 08/17/2022 4:41 PM

## 2022-08-03 NOTE — Sepsis Progress Note (Signed)
Elink following code sepsis  35 messaged bedside RN to inquire on only 1 BC drawn asking for documentation to state reason

## 2022-08-03 NOTE — Progress Notes (Signed)
A consult was received from an ED physician for Cefepime and Vancomycin per pharmacy dosing.  The patient's profile has been reviewed for ht/wt/allergies/indication/available labs.   A one time order has been placed for Cefepime 2g and Vancomycin 750mg .  Further antibiotics/pharmacy consults should be ordered by admitting physician if indicated.                       Thank you,  Gretta Arab PharmD, BCPS Clinical Pharmacist [facility] main pharmacy [phone] 08/06/2022 1:21 PM

## 2022-08-03 NOTE — ED Triage Notes (Signed)
Pt reports SHOB, leg infection and abd pain. Pt reports this has been going on since this morning.  Pt reports hx lung ca O2 sats in triage 89% RA

## 2022-08-03 NOTE — ED Notes (Signed)
Unable to obtain pt temp at this time

## 2022-08-03 NOTE — ED Notes (Signed)
2 unsuccessful attempts at 2nd IV line/ blood cultures, R hand and R forearm

## 2022-08-04 ENCOUNTER — Inpatient Hospital Stay (HOSPITAL_COMMUNITY): Payer: Medicare PPO

## 2022-08-04 ENCOUNTER — Other Ambulatory Visit: Payer: Self-pay

## 2022-08-04 DIAGNOSIS — F319 Bipolar disorder, unspecified: Secondary | ICD-10-CM

## 2022-08-04 DIAGNOSIS — C3491 Malignant neoplasm of unspecified part of right bronchus or lung: Secondary | ICD-10-CM

## 2022-08-04 DIAGNOSIS — R570 Cardiogenic shock: Secondary | ICD-10-CM

## 2022-08-04 DIAGNOSIS — I213 ST elevation (STEMI) myocardial infarction of unspecified site: Principal | ICD-10-CM

## 2022-08-04 DIAGNOSIS — R079 Chest pain, unspecified: Secondary | ICD-10-CM | POA: Diagnosis not present

## 2022-08-04 DIAGNOSIS — J479 Bronchiectasis, uncomplicated: Secondary | ICD-10-CM | POA: Diagnosis not present

## 2022-08-04 DIAGNOSIS — I2111 ST elevation (STEMI) myocardial infarction involving right coronary artery: Secondary | ICD-10-CM

## 2022-08-04 DIAGNOSIS — R64 Cachexia: Secondary | ICD-10-CM

## 2022-08-04 DIAGNOSIS — E871 Hypo-osmolality and hyponatremia: Secondary | ICD-10-CM

## 2022-08-04 DIAGNOSIS — I10 Essential (primary) hypertension: Secondary | ICD-10-CM

## 2022-08-04 LAB — COMPREHENSIVE METABOLIC PANEL
ALT: 18 U/L (ref 0–44)
AST: 36 U/L (ref 15–41)
Albumin: 1.8 g/dL — ABNORMAL LOW (ref 3.5–5.0)
Alkaline Phosphatase: 87 U/L (ref 38–126)
Anion gap: 12 (ref 5–15)
BUN: 22 mg/dL (ref 8–23)
CO2: 21 mmol/L — ABNORMAL LOW (ref 22–32)
Calcium: 8.8 mg/dL — ABNORMAL LOW (ref 8.9–10.3)
Chloride: 99 mmol/L (ref 98–111)
Creatinine, Ser: 0.8 mg/dL (ref 0.44–1.00)
GFR, Estimated: >60 mL/min (ref >60)
Glucose, Bld: 102 mg/dL — ABNORMAL HIGH (ref 70–99)
Potassium: 4.9 mmol/L (ref 3.5–5.1)
Sodium: 132 mmol/L — ABNORMAL LOW (ref 135–145)
Total Bilirubin: 1.2 mg/dL (ref 0.3–1.2)
Total Protein: 4.5 g/dL — ABNORMAL LOW (ref 6.5–8.1)

## 2022-08-04 LAB — CBC
HCT: 45.9 % (ref 36.0–46.0)
Hemoglobin: 14.7 g/dL (ref 12.0–15.0)
MCH: 29.1 pg (ref 26.0–34.0)
MCHC: 32 g/dL (ref 30.0–36.0)
MCV: 90.7 fL (ref 80.0–100.0)
Platelets: UNDETERMINED 10*3/uL (ref 150–400)
RBC: 5.06 MIL/uL (ref 3.87–5.11)
RDW: 14.2 % (ref 11.5–15.5)
WBC: 2.7 10*3/uL — ABNORMAL LOW (ref 4.0–10.5)
nRBC: 1.5 % — ABNORMAL HIGH (ref 0.0–0.2)

## 2022-08-04 LAB — RESPIRATORY PANEL BY PCR

## 2022-08-04 LAB — ECHOCARDIOGRAM COMPLETE
AR max vel: 1.54 cm2
AV Area VTI: 1.34 cm2
AV Area mean vel: 1.52 cm2
AV Mean grad: 9 mmHg
AV Peak grad: 19.4 mmHg
Ao pk vel: 2.2 m/s
Area-P 1/2: 2.39 cm2
Calc EF: 67.1 %
Height: 63 in
S' Lateral: 1.9 cm
Single Plane A2C EF: 55.4 %
Single Plane A4C EF: 76.1 %
Weight: 1446.22 oz

## 2022-08-04 LAB — PROCALCITONIN: Procalcitonin: 19.9 ng/mL

## 2022-08-04 LAB — HEPARIN LEVEL (UNFRACTIONATED): Heparin Unfractionated: <0.1 IU/mL — ABNORMAL LOW (ref 0.30–0.70)

## 2022-08-04 LAB — LACTIC ACID, PLASMA
Lactic Acid, Venous: 3.2 mmol/L (ref 0.5–1.9)
Lactic Acid, Venous: 2.9 mmol/L (ref 0.5–1.9)

## 2022-08-04 LAB — PROTIME-INR
INR: 1.8 — ABNORMAL HIGH (ref 0.8–1.2)
Prothrombin Time: 20.3 seconds — ABNORMAL HIGH (ref 11.4–15.2)

## 2022-08-04 LAB — TROPONIN I (HIGH SENSITIVITY)
Troponin I (High Sensitivity): 616 ng/L (ref <18)
Troponin I (High Sensitivity): 1309 ng/L (ref <18)
Troponin I (High Sensitivity): 2239 ng/L (ref <18)
Troponin I (High Sensitivity): 3351 ng/L (ref <18)

## 2022-08-04 LAB — CORTISOL-AM, BLOOD: Cortisol - AM: >100 ug/dL — ABNORMAL HIGH (ref 6.7–22.6)

## 2022-08-04 MED ORDER — MORPHINE SULFATE (PF) 2 MG/ML IV SOLN
2.0000 mg | INTRAVENOUS | Status: DC | PRN
  Administered 2022-08-04 (×2): 2 mg via INTRAVENOUS
  Filled 2022-08-04 (×2): qty 1

## 2022-08-04 MED ORDER — ATROPINE SULFATE 1 MG/10ML IJ SOSY
PREFILLED_SYRINGE | INTRAMUSCULAR | Status: AC
  Filled 2022-08-04: qty 10

## 2022-08-04 MED ORDER — HEPARIN BOLUS VIA INFUSION
2000.0000 [IU] | Freq: Once | INTRAVENOUS | Status: DC
  Filled 2022-08-04: qty 2000

## 2022-08-04 MED ORDER — HEPARIN (PORCINE) 25000 UT/250ML-% IV SOLN: 650.0000 [IU]/h | INTRAVENOUS | Status: DC

## 2022-08-04 MED ORDER — BISACODYL 10 MG RE SUPP
10.0000 mg | Freq: Once | RECTAL | Status: AC
Start: 2022-08-04 — End: 2022-08-04
  Administered 2022-08-04: 10 mg via RECTAL
  Filled 2022-08-04: qty 1

## 2022-08-04 MED ORDER — LACTATED RINGERS IV BOLUS
500.0000 mL | Freq: Once | INTRAVENOUS | Status: AC
Start: 2022-08-04 — End: 2022-08-04
  Administered 2022-08-04: 500 mL via INTRAVENOUS

## 2022-08-04 MED ORDER — ASPIRIN 325 MG PO TABS
325.0000 mg | ORAL_TABLET | Freq: Every day | ORAL | Status: DC
  Administered 2022-08-04: 325 mg via ORAL
  Filled 2022-08-04: qty 1

## 2022-08-04 MED ORDER — OXYCODONE-ACETAMINOPHEN 5-325 MG PO TABS: 1.0000 | ORAL_TABLET | Freq: Three times a day (TID) | ORAL | Status: DC | PRN

## 2022-08-04 MED ORDER — HYDROMORPHONE HCL 1 MG/ML IJ SOLN
1.0000 mg | Freq: Once | INTRAMUSCULAR | Status: AC
  Administered 2022-08-04: 1 mg via INTRAVENOUS
  Filled 2022-08-04: qty 1

## 2022-08-04 MED ORDER — IPRATROPIUM-ALBUTEROL 0.5-2.5 (3) MG/3ML IN SOLN: 3.0000 mL | Freq: Four times a day (QID) | RESPIRATORY_TRACT | Status: DC | PRN

## 2022-08-04 MED ORDER — PHENYLEPHRINE 80 MCG/ML (10ML) SYRINGE FOR IV PUSH (FOR BLOOD PRESSURE SUPPORT)
PREFILLED_SYRINGE | INTRAVENOUS | Status: AC
  Filled 2022-08-04: qty 10

## 2022-08-04 MED ORDER — HEPARIN (PORCINE) 25000 UT/250ML-% IV SOLN
500.0000 [IU]/h | INTRAVENOUS | Status: DC
Start: 2022-08-04 — End: 2022-08-04
  Administered 2022-08-04: 500 [IU]/h via INTRAVENOUS
  Filled 2022-08-04: qty 250

## 2022-08-04 MED ORDER — METRONIDAZOLE 500 MG/100ML IV SOLN
500.0000 mg | Freq: Two times a day (BID) | INTRAVENOUS | Status: DC
  Administered 2022-08-04: 500 mg via INTRAVENOUS
  Filled 2022-08-04: qty 100

## 2022-08-04 MED ORDER — MORPHINE 100MG IN NS 100ML (1MG/ML) PREMIX INFUSION
5.0000 mg/h | INTRAVENOUS | Status: DC
  Administered 2022-08-04: 5 mg/h via INTRAVENOUS
  Filled 2022-08-04: qty 100

## 2022-08-04 MED ORDER — SODIUM CHLORIDE 0.9 % IV SOLN: INTRAVENOUS | Status: DC

## 2022-08-05 ENCOUNTER — Inpatient Hospital Stay: Payer: Medicare PPO | Admitting: Internal Medicine

## 2022-08-05 ENCOUNTER — Inpatient Hospital Stay: Payer: Medicare PPO

## 2022-08-05 ENCOUNTER — Telehealth: Payer: Self-pay

## 2022-08-05 NOTE — Telephone Encounter (Signed)
Pts husband, Delfino Lovett, called to advise Dr. Julien Nordmann that pt passed away Aug 18, 2022.

## 2022-08-08 ENCOUNTER — Telehealth (HOSPITAL_COMMUNITY): Payer: Medicare PPO | Admitting: Psychiatry

## 2022-08-08 LAB — CULTURE, BLOOD (ROUTINE X 2)
Culture: NO GROWTH
Culture: NO GROWTH
Special Requests: ADEQUATE

## 2022-08-12 ENCOUNTER — Inpatient Hospital Stay: Payer: Medicare PPO

## 2022-08-13 ENCOUNTER — Other Ambulatory Visit (HOSPITAL_COMMUNITY): Payer: Self-pay | Admitting: Psychiatry

## 2022-08-13 DIAGNOSIS — F319 Bipolar disorder, unspecified: Secondary | ICD-10-CM

## 2022-08-14 ENCOUNTER — Other Ambulatory Visit (HOSPITAL_COMMUNITY): Payer: Self-pay | Admitting: Psychiatry

## 2022-08-14 DIAGNOSIS — F419 Anxiety disorder, unspecified: Secondary | ICD-10-CM

## 2022-08-18 ENCOUNTER — Ambulatory Visit: Payer: Medicare PPO | Admitting: Internal Medicine

## 2022-08-18 ENCOUNTER — Ambulatory Visit: Payer: Medicare PPO

## 2022-08-18 ENCOUNTER — Other Ambulatory Visit: Payer: Medicare PPO

## 2022-08-21 ENCOUNTER — Ambulatory Visit: Payer: Self-pay | Admitting: Radiation Oncology

## 2022-08-25 ENCOUNTER — Other Ambulatory Visit: Payer: Medicare PPO

## 2022-08-31 NOTE — Progress Notes (Addendum)
08/20/2022 1300  Clinical Encounter Type  Visited With Patient and family together  Visit Type Initial;Spiritual support;Psychological support;Critical Care  Referral From Nurse  Consult/Referral To Chaplain  Recommendations Anticipatory Grief  Spiritual Encounters  Spiritual Needs Sacred text;Prayer;Ritual;Emotional;Grief support  Stress Factors  Patient Stress Factors None identified  Family Stress Factors Loss   Chaplain met with patient who was surrounded by her husband of 45 years and two adult daughters at her side.  Provided EOL counsel and comfort. Addressed Anticipatory Grief with family.  They spoke of her life narrative and the heritage she gave each of them,   ending with reciting The Lord's Prayer.  Chaplain anointed Kirti with oil and recited the 23rd Psalm.    Respectfully submitted,   Rev. Kate Huddelson- 

## 2022-08-31 NOTE — Significant Event (Signed)
I was called by the patient's nurse that patient's telemetry monitor was showing ST elevation and 12-lead EKG did show inferior leads ST elevation with ST depression in V1 V2.  On exam at bedside patient not in distress denies any chest pain.  I reviewed patient's labs and notes and medications.  Discussed with on-call cardiologist Dr. Einar Gip.  Plan is at this time to start patient on heparin and aspirin trend cardiac markers.  Dr. Einar Gip be seeing patient in consult.  Catherine Kelley

## 2022-08-31 NOTE — TOC Initial Note (Signed)
Transition of Care [facility]) - Initial/Assessment Note    Patient Details  Name: Catherine Kelley MRN: [id number] Date of Birth: 03-11-1951  Transition of Care [facility]) CM/SW Contact:    Leeroy Cha, RN Phone Number: 2022-08-31, 8:34 AM  Clinical Narrative:                  Transition of Care [facility]) Screening Note   Patient Details  Name: Catherine Kelley Date of Birth: 1951/02/27   Transition of Care [facility]) CM/SW Contact:    Leeroy Cha, RN Phone Number: 31-Aug-2022, 8:35 AM    Transition of Care Department [facility]) has reviewed patient and no TOC needs have been identified at this time. We will continue to monitor patient advancement through interdisciplinary progression rounds. If new patient transition needs arise, please place a TOC consult.    Expected Discharge Plan: Home/Self Care Barriers to Discharge: Continued Medical Work up   Patient Goals and CMS Choice Patient states their goals for this hospitalization and ongoing recovery are:: to return home CMS Medicare.gov Compare Post Acute Care list provided to:: Patient    Expected Discharge Plan and Services Expected Discharge Plan: Home/Self Care   Discharge Planning Services: CM Consult   Living arrangements for the past 2 months: Single Family Home                                      Prior Living Arrangements/Services Living arrangements for the past 2 months: Single Family Home Lives with:: Spouse Patient language and need for interpreter reviewed:: Yes Do you feel safe going back to the place where you live?: Yes            Criminal Activity/Legal Involvement Pertinent to Current Situation/Hospitalization: No - Comment as needed  Activities of Daily Living Home Assistive Devices/Equipment: Environmental consultant (specify type), Shower chair with back, Eyeglasses (front wheel chair) ADL Screening (condition at time of admission) Patient's cognitive ability adequate to safely complete daily activities?: No Is the  patient deaf or have difficulty hearing?: Yes Does the patient have difficulty seeing, even when wearing glasses/contacts?: No Does the patient have difficulty concentrating, remembering, or making decisions?: Yes Patient able to express need for assistance with ADLs?: Yes Does the patient have difficulty dressing or bathing?: Yes Independently performs ADLs?: No Communication: Independent Dressing (OT): Needs assistance Is this a change from baseline?: Pre-admission baseline Grooming: Needs assistance Is this a change from baseline?: Pre-admission baseline Feeding: Needs assistance Is this a change from baseline?: Pre-admission baseline Bathing: Needs assistance Is this a change from baseline?: Pre-admission baseline Toileting: Needs assistance Is this a change from baseline?: Pre-admission baseline In/Out Bed: Needs assistance Is this a change from baseline?: Pre-admission baseline Walks in Home: Needs assistance Is this a change from baseline?: Pre-admission baseline Does the patient have difficulty walking or climbing stairs?: Yes Weakness of Legs: Both Weakness of Arms/Hands: Both  Permission Sought/Granted                  Emotional Assessment Appearance:: Appears stated age Attitude/Demeanor/Rapport: Engaged Affect (typically observed): Calm Orientation: : Oriented to Self, Oriented to Place, Oriented to  Time, Oriented to Situation Alcohol / Substance Use: Never Used Psych Involvement: No (comment)  Admission diagnosis:  Sepsis [facility]) [A41.9] Patient Active Problem List   Diagnosis Date Noted   Sepsis ([place]) 08/16/2022   Leukopenia 08/06/2022   Thrombocytopenia ([place]) 08/07/2022  Adenocarcinoma of right lung, stage 4 ([place]) 06/24/2022   Abnormal CT of the chest 06/09/2022   Fecal impaction ([facility])    Nausea and vomiting    Colitis 04/24/2022   Constipation 04/24/2022   Lesion of bone of thoracic spine 04/24/2022   Bronchiectasis ([place]) 04/24/2022   Abnormal CT  of the abdomen    Chronic constipation    Change in bowel habits    Loss of weight    Generalized abdominal pain    Chronic idiopathic constipation 04/23/2022   Impaction, bowel ([place]) 04/23/2022   Vitamin D deficiency 07/06/2020   Cellulitis of right lower extremity 10/22/2019   Cellulitis 10/21/2019   Bipolar 1 disorder ([place]) 10/11/2019   Tobacco dependence due to cigarettes 10/11/2019   Fall as cause of accidental injury at home as place of occurrence 10/11/2019   Traumatic hematoma of knee, right, initial encounter 10/11/2019   Lower respiratory infection 12/22/2018   Senile ecchymosis 09/11/2018   PNA (pneumonia) 09/17/2017   Near syncope 09/17/2017   Tobacco abuse 02/12/2016   Other fatigue 02/04/2016   Respiratory failure with hypoxia ([place]) 01/09/2016   Protein-calorie malnutrition, severe ([place]) 11/03/2015   Pulmonary emphysema ([place])    Essential hypertension 09/27/2015   Rhabdomyolysis 08/16/2015   Cough 06/07/2015   Bronchiectasis with acute exacerbation ([place]) 03/07/2015   Cigarette smoker 12/13/2014   Stage 2 moderate COPD by GOLD classification ([place]) 10/11/2014   Lumbar back pain with radiculopathy affecting right lower extremity 09/28/2014   Hyponatremia 07/09/2014   Hx of recurrent pneumonia 07/08/2014   Venous insufficiency (chronic) (peripheral) 09/29/2012   Lower extremity edema 03/09/2012   Benign hypertensive heart disease without heart failure 05/09/2011   Chest pain 05/09/2011   Dyslipidemia 05/09/2011   Mood disorder ([place]) 05/09/2011   Osteoarthritis 05/09/2011   PCP:  Maximiano Coss, NP Pharmacy:   [id number] [name] [place] [address] Phone: [phone] Fax: [phone]     Social Determinants of Health (SDOH) Interventions    Readmission Risk Interventions   No data to display

## 2022-08-31 NOTE — Death Summary Note (Addendum)
DEATH SUMMARY   Patient Details  Name: Catherine Kelley MRN: [id number] DOB: Sep 10, 1951 GDJ:MEQAST, Delfino Lovett, NP Admission/Discharge Information   Admit Date:  09/01/2022  Date of Death: Date of Death: 2022-09-02  Time of Death: Time of Death: 03/08/1501  Length of Stay: 1   Principle Cause of death: STEMI  Hospital Diagnoses: Principal Problem:   STEMI (ST elevation myocardial infarction) ([place]) Active Problems:   Cardiogenic shock ([place])   Hyponatremia   Essential hypertension   Bipolar 1 disorder ([place])   Chronic idiopathic constipation   Bronchiectasis ([place])   Adenocarcinoma of right lung, stage 4 (HCC)   Leukopenia   Thrombocytopenia ([place])   Cachectic [facility])   Hospital Course:  This is a 71 year old female with adenocarcinoma of the lung, metastatic, bronchiectasis, hypertension presented to the hospital for shortness of breath, abdominal pain nausea and constipation.  Pulse ox noted to be 89% on room air in triage. The ED: Noted to be tachycardic with increased work of breathing, tenderness throughout the abdomen. CT chest abdomen pelvis: Showed bilateral bronchiectasis mass in the right lower lobe, right upper lobe and left lower lobe tree-in-bud opacities with underlying changes consistent with emphysema.  Cirrhotic change of the liver was also noted. Lactic acid was 6.6, WBC count 3.3, sodium 128, bicarb 21 and platelets slightly low at 143. Influenza and COVID-negative She was given cefepime vancomycin and Flagyl along with IV fluids.   She was subsequently found to have a STEMI without chest pain.  ST elevations were noted in the inferior leads.  Cardiology was consulted.  She was also placed on heparin infusion.  Assessment and Plan:  Principal Problem:   STEMI (ST elevation myocardial infarction) ([place]) with possible cardiogenic shock - likely inferior wall - Troponin 3,351 - Lactic acid 2.9 when last checked - Heparin infusion started -Transition to comfort care by  cardiology, Dr. Lina Sayre assistance   Active Problems: AV block - due to above - HR in 40s per RN alternating with sinus tach  Hyponatremia - treated with IVF- possibly was dehydrated   Abdominal pain - ? If this is an anginal equivalent for her-    Metastatic adenocarcinoma of lung - sp first chemo last week - per family she has not tolerated this well and has had vomiting    Severely underweight/ cachexia  Body mass index is 16.01 kg/m.   Leukopenia and mild thrombocytopenia - related to chemo      The results of significant diagnostics from this hospitalization (including imaging, microbiology, ancillary and laboratory) are listed below for reference.   Significant Diagnostic Studies: ECHOCARDIOGRAM COMPLETE  Result Date: 09/02/22    ECHOCARDIOGRAM REPORT   Patient Name:   Catherine Kelley Date of Exam: Sep 02, 2022 Medical Rec #:  [id number]   Height:       63.0 in Accession #:    [id number]  Weight:       90.4 lb Date of Birth:  07-26-1951   BSA:          1.380 m Patient Age:    71 years    BP:           92/62 mmHg Patient Gender: F           HR:           49 bpm. Exam Location:  Inpatient Procedure: 2D Echo, Cardiac Doppler and Color Doppler Indications:    122-I22.9 Subsequent ST elevation (STEM) and non-ST elevation                 (  NSTEMI) myocardial infarction  History:        Patient has prior history of Echocardiogram examinations, most                 recent 09/18/2017. Abnormal ECG, COPD, Signs/Symptoms:Shortness                 of Breath, Dyspnea and Syncope; Risk Factors:Current Smoker and                 Dyslipidemia. Chemo. Lung cancer.  Sonographer:    Roseanna Rainbow RDCS Referring Phys: [phone] [facility]  Sonographer Comments: Technically difficult study due to poor echo windows. Difficult exam. Patient moaning throughout and had episodes of arrhythmia throughout exam. Patient has extremely thin habitus. IMPRESSIONS  1. Technically difficult study.  2. Left ventricular  ejection fraction, by estimation, is >75%. The left ventricle has hyperdynamic function. The left ventricle has no regional wall motion abnormalities. Left ventricular diastolic function could not be evaluated.  3. Right ventricular systolic function is mildly reduced. The right ventricular size is normal. There is normal pulmonary artery systolic pressure.  4. Large pleural effusion in the right lateral region.  5. The mitral valve is abnormal. Trivial mitral valve regurgitation.  6. The aortic valve was not well visualized. There is moderate calcification of the aortic valve. Aortic valve regurgitation is not visualized. Mild aortic valve stenosis. Aortic valve area, by VTI measures 1.34 cm. Peak gradient 19.4 mmHg. DI is 0.43.  7. The inferior vena cava is normal in size with greater than 50% respiratory variability, suggesting right atrial pressure of 3 mmHg. Comparison(s): Changes from prior study are noted. 09/18/2017: LVEF 60-65%, grade 1 DD. FINDINGS  Left Ventricle: Left ventricular ejection fraction, by estimation, is >75%. The left ventricle has hyperdynamic function. The left ventricle has no regional wall motion abnormalities. The left ventricular internal cavity size was normal in size. There is no left ventricular hypertrophy. Left ventricular diastolic function could not be evaluated due to atrial fibrillation. Left ventricular diastolic function could not be evaluated. Right Ventricle: The right ventricular size is normal. No increase in right ventricular wall thickness. Right ventricular systolic function is mildly reduced. There is normal pulmonary artery systolic pressure. The tricuspid regurgitant velocity is 2.56 m/s, and with an assumed right atrial pressure of 3 mmHg, the estimated right ventricular systolic pressure is 63.8 mmHg. Left Atrium: Left atrial size was normal in size. Right Atrium: Right atrial size was normal in size. Pericardium: There is no evidence of pericardial effusion.  Mitral Valve: The mitral valve is abnormal. Mild to moderate mitral annular calcification. Trivial mitral valve regurgitation. MV peak gradient, 8.4 mmHg. The mean mitral valve gradient is 2.7 mmHg. Tricuspid Valve: The tricuspid valve is grossly normal. Tricuspid valve regurgitation is mild. Aortic Valve: The aortic valve was not well visualized. There is moderate calcification of the aortic valve. Aortic valve regurgitation is not visualized. Mild aortic stenosis is present. Aortic valve mean gradient measures 9.0 mmHg. Aortic valve peak gradient measures 19.4 mmHg. Aortic valve area, by VTI measures 1.34 cm. Pulmonic Valve: The pulmonic valve was not well visualized. Pulmonic valve regurgitation is not visualized. Aorta: The aortic root and ascending aorta are structurally normal, with no evidence of dilitation. Venous: The inferior vena cava is normal in size with greater than 50% respiratory variability, suggesting right atrial pressure of 3 mmHg. IAS/Shunts: The interatrial septum was not well visualized. Additional Comments: There is a large pleural effusion in the right lateral region.  LEFT VENTRICLE PLAX 2D LVIDd:         3.00 cm LVIDs:         1.90 cm LV PW:         0.70 cm LV IVS:        0.60 cm LVOT diam:     2.00 cm LV SV:         36 LV SV Index:   26 LVOT Area:     3.14 cm  LV Volumes (MOD) LV vol d, MOD A2C: 28.9 ml LV vol d, MOD A4C: 34.8 ml LV vol s, MOD A2C: 12.9 ml LV vol s, MOD A4C: 8.3 ml LV SV MOD A2C:     16.0 ml LV SV MOD A4C:     34.8 ml LV SV MOD BP:      21.4 ml RIGHT VENTRICLE RV S prime:     9.90 cm/s TAPSE (M-mode): 1.1 cm LEFT ATRIUM           Index        RIGHT ATRIUM          Index LA diam:      1.90 cm 1.38 cm/m   RA Area:     6.36 cm LA Vol (A2C): 28.0 ml 20.29 ml/m  RA Volume:   9.43 ml  6.83 ml/m LA Vol (A4C): 12.7 ml 9.20 ml/m  AORTIC VALVE AV Area (Vmax):    1.54 cm AV Area (Vmean):   1.52 cm AV Area (VTI):     1.34 cm AV Vmax:           220.00 cm/s AV Vmean:           136.000 cm/s AV VTI:            0.270 m AV Peak Grad:      19.4 mmHg AV Mean Grad:      9.0 mmHg LVOT Vmax:         108.00 cm/s LVOT Vmean:        65.900 cm/s LVOT VTI:          0.115 m LVOT/AV VTI ratio: 0.43  AORTA Ao Root diam: 2.60 cm MITRAL VALVE                TRICUSPID VALVE MV Area (PHT): 2.39 cm     TR Peak grad:   26.2 mmHg MV Peak grad:  8.4 mmHg     TR Vmax:        256.00 cm/s MV Mean grad:  2.7 mmHg MV Vmax:       1.45 m/s     SHUNTS MV Vmean:      73.6 cm/s    Systemic VTI:  0.12 m MV Decel Time: 317 msec     Systemic Diam: 2.00 cm MV E velocity: 100.15 cm/s MV A velocity: 138.00 cm/s MV E/A ratio:  0.73 Lyman Bishop MD Electronically signed by Lyman Bishop MD Signature Date/Time: 2022-08-08/2:03:23 PM    Final    DG Chest Portable 1 View  Result Date: 08/07/2022 CLINICAL DATA:  Shortness of breath. Lung infection and abdominal pain. Abnormal PET scan and chest CTs. EXAM: PORTABLE CHEST 1 VIEW COMPARISON:  CT chest 08/22/2022 and 06/06/2022. FINDINGS: The heart size is normal. Atherosclerotic calcifications are present at the aortic arch. Diffuse interstitial changes are again noted. Masslike densities bilaterally are better described on CT. Most prominent areas in the right upper lobe. Bilateral bronchiectasis is better seen in the lower lobes. IMPRESSION:  1. Bilateral masses are better described on CT. Given assistant appearance and avid uptake on PET scan, these remain concerning for neoplasm. 2. Diffuse interstitial changes with bilateral bronchiectasis. 3. Aortic atherosclerosis. 4. No acute cardiopulmonary disease or significant interval change compared to the recent CT. Electronically Signed   By: San Morelle M.D.   On: 08/02/2022 14:13   CT CHEST ABDOMEN PELVIS W CONTRAST  Result Date: 08/11/2022 CLINICAL DATA:  Abdominal pain. Leg infection. Shortness of breath. Concern for sepsis. EXAM: CT CHEST, ABDOMEN, AND PELVIS WITH CONTRAST TECHNIQUE: Multidetector CT imaging of the  chest, abdomen and pelvis was performed following the standard protocol during bolus administration of intravenous contrast. RADIATION DOSE REDUCTION: This exam was performed according to the departmental dose-optimization program which includes automated exposure control, adjustment of the mA and/or kV according to patient size and/or use of iterative reconstruction technique. CONTRAST:  131mL OMNIPAQUE IOHEXOL 300 MG/ML  SOLN COMPARISON:  06/06/2022, chest CT. PET-CT, 05/16/2022. Chest, abdomen and pelvis CT, 04/23/2022. FINDINGS: CT CHEST FINDINGS Cardiovascular: Heart normal in size. No pericardial effusion. Mild left coronary artery calcifications. Great vessels are normal caliber. Mild aortic atherosclerosis. Mediastinum/Nodes: No neck base, mediastinal or hilar masses. Borderline enlarged right paratracheal, ascus level node measuring 1 cm short axis. Prominent subcarinal node measuring 1.1 cm short axis. Nodes are stable. Trachea and esophagus are unremarkable. Lungs/Pleura: Bilateral bronchiectasis, most apparent in the right lower lobe and right middle lobe where it is cystic, with multiple bronchi demonstrating air-fluid levels or being fluid/mucous filled. Masslike area opacity in the peripheral right upper lobe, centered on image 65, series 4, measuring 2.8 x 1.7 x 1.8 cm, similar to the prior chest CT. Second similar area of focal opacity in the posterolateral right upper lobe, centered on image 56, series 4, measuring 3.4 x 1.6 x 2.3 cm, also without significant change. Focal peripheral opacities in with associated cystic spaces/bronchiectasis in anteromedial right upper lobe and anterior left upper lobe. Small ill-defined nodular opacities with multiple tree-in-bud opacities noted bilaterally most evident in the left lower lobe. Moderate centrilobular emphysema. No pleural effusion.  No pneumothorax. Musculoskeletal: No fracture or acute finding. Dense sclerosis noted in the T11, T12, L1 and L2  vertebra and posterior elements from the lower thoracic through the lumbar spine. These findings are stable. CT ABDOMEN PELVIS FINDINGS Hepatobiliary: Liver with central volume loss and subtle surface nodularity. No liver mass. Unremarkable gallbladder. No bile duct dilation. Pancreas: Unremarkable. No pancreatic ductal dilatation or surrounding inflammatory changes. Spleen: Normal in size without focal abnormality. Adrenals/Urinary Tract: Mild adrenal thickening, stable consistent with hyperplasia. Kidneys normal in size, orientation and position. Tiny low-attenuation lesion, anterior midpole the left kidney consistent with a cyst. No follow-up recommended. No stones. No hydronephrosis. Ureters not well visualized but grossly normal in course and in caliber. Bladder decompressed. Stomach/Bowel: Stomach mostly decompressed. Mucosa shows relative increased enhancement. Wall thickening evident of the antrum. No mass. Small bowel is normal in caliber. No wall thickening or evidence of inflammation. Colon normal in caliber. No wall thickening or convincing inflammation. Mild increased stool burden left colon. Fluid noted in the right colon. Vascular/Lymphatic: Aortic atherosclerosis. No aneurysm. No enlarged lymph nodes. Reproductive: Uterus and bilateral adnexa are unremarkable. Other: Small amount of ascites. Musculoskeletal: Sclerotic changes of the lower thoracic and upper to middle lumbar spine as noted on the chest portion. No osteolytic lesions. Degenerative changes of the lower lumbar spine with a grade 1 anterolisthesis of L4 on L5. No fracture. IMPRESSION: 1. Lung  findings are similar to the recent prior exam with extensive cystic and varicose bronchiectasis, with mucous plugging. There are peripheral lung opacities in the right upper lobe, which are similar to the most recent prior CT, which may relate may be infectious/inflammatory in etiology or neoplastic. Mild mediastinal lymphadenopathy is also stable.  No acute findings in the lungs. 2. Sclerotic changes of the lower thoracic and upper to mid lumbar spine, without change. This was hypermetabolic on the prior PET-CT suspected to be metastatic disease. No osteolytic lesions. 3. Liver morphologic changes consistent with cirrhosis. No liver mass. 4. Small amount of ascites which is new compared to the prior abdomen and pelvis CT. 5. No bowel obstruction or inflammation. Mild increase in the left colon stool burden and fluid within the right colon, nonspecific. Electronically Signed   By: Lajean Manes M.D.   On: 08/11/2022 14:00    Microbiology: Recent Results (from the past 240 hour(s))  Resp Panel by RT-PCR (Flu A&B, Covid) Anterior Nasal Swab     Status: None   Collection Time: 08/18/2022 12:24 PM   Specimen: Anterior Nasal Swab  Result Value Ref Range Status   SARS Coronavirus 2 by RT PCR NEGATIVE NEGATIVE Final    Comment: (NOTE) SARS-CoV-2 target nucleic acids are NOT DETECTED.  The SARS-CoV-2 RNA is generally detectable in upper respiratory specimens during the acute phase of infection. The lowest concentration of SARS-CoV-2 viral copies this assay can detect is 138 copies/mL. A negative result does not preclude SARS-Cov-2 infection and should not be used as the sole basis for treatment or other patient management decisions. A negative result may occur with  improper specimen collection/handling, submission of specimen other than nasopharyngeal swab, presence of viral mutation(s) within the areas targeted by this assay, and inadequate number of viral copies(<138 copies/mL). A negative result must be combined with clinical observations, patient history, and epidemiological information. The expected result is Negative.  Fact Sheet for Patients:  EntrepreneurPulse.com.au  Fact Sheet for Healthcare Providers:  IncredibleEmployment.be  This test is no t yet approved or cleared by the [place] FDA  and  has been authorized for detection and/or diagnosis of SARS-CoV-2 by FDA under an Emergency Use Authorization (EUA). This EUA will remain  in effect (meaning this test can be used) for the duration of the COVID-19 declaration under Section 564(b)(1) of the Act, 21 U.S.C.section 360bbb-3(b)(1), unless the authorization is terminated  or revoked sooner.       Influenza A by PCR NEGATIVE NEGATIVE Final   Influenza B by PCR NEGATIVE NEGATIVE Final    Comment: (NOTE) The Xpert Xpress SARS-CoV-2/FLU/RSV plus assay is intended as an aid in the diagnosis of influenza from Nasopharyngeal swab specimens and should not be used as a sole basis for treatment. Nasal washings and aspirates are unacceptable for Xpert Xpress SARS-CoV-2/FLU/RSV testing.  Fact Sheet for Patients: EntrepreneurPulse.com.au  Fact Sheet for Healthcare Providers: IncredibleEmployment.be  This test is not yet approved or cleared by the [place] FDA and has been authorized for detection and/or diagnosis of SARS-CoV-2 by FDA under an Emergency Use Authorization (EUA). This EUA will remain in effect (meaning this test can be used) for the duration of the COVID-19 declaration under Section 564(b)(1) of the Act, 21 U.S.C. section 360bbb-3(b)(1), unless the authorization is terminated or revoked.  Performed at [facility], [place] [address], [place], [place] [address]   Blood culture (routine x 2)     Status: None (Preliminary result)   Collection Time:  08/24/2022 12:30 PM   Specimen: BLOOD  Result Value Ref Range Status   Specimen Description   Final    BLOOD BLOOD LEFT FOREARM Performed at [facility] [address], [place], [place] [address]    Special Requests   Final    BOTTLES DRAWN AEROBIC AND ANAEROBIC Blood Culture adequate volume Performed at [facility] [address], [place], [place] [address]     Culture   Final    NO GROWTH < 24 HOURS Performed at [facility] [address], [place], [place] [address]    Report Status PENDING  Incomplete  MRSA Next Gen by PCR, Nasal     Status: None   Collection Time: 08/14/2022  5:54 PM   Specimen: Nasal Mucosa; Nasal Swab  Result Value Ref Range Status   MRSA by PCR Next Gen NOT DETECTED NOT DETECTED Final    Comment: (NOTE) The GeneXpert MRSA Assay (FDA approved for NASAL specimens only), is one component of a comprehensive MRSA colonization surveillance program. It is not intended to diagnose MRSA infection nor to guide or monitor treatment for MRSA infections. Test performance is not FDA approved in patients less than 54 years old. Performed at [facility], [place] [address] [place], [place] [address]   Blood culture (routine x 2)     Status: None (Preliminary result)   Collection Time: 08/10/2022  7:44 PM   Specimen: BLOOD  Result Value Ref Range Status   Specimen Description   Final    BLOOD Performed at [facility] [address], [place], [place] [address]    Special Requests   Final    BOTTLES DRAWN AEROBIC ONLY Blood Culture results may not be optimal due to an inadequate volume of blood received in culture bottles Performed at [facility] [address], [place], [place] [address]    Culture   Final    NO GROWTH < 12 HOURS Performed at [facility] [address] [place], [place] [address]    Report Status PENDING  Incomplete  Respiratory (~20 pathogens) panel by PCR     Status: None   Collection Time: 08/19/2022  7:57 AM   Specimen: Nasopharyngeal Swab; Respiratory  Result Value Ref Range Status   Adenovirus NOT DETECTED NOT DETECTED Final   Coronavirus 229E NOT DETECTED NOT DETECTED Final    Comment: (NOTE) The Coronavirus on the Respiratory Panel, DOES NOT test for the novel  Coronavirus (2019 nCoV)    Coronavirus HKU1 NOT DETECTED NOT DETECTED  Final   Coronavirus NL63 NOT DETECTED NOT DETECTED Final   Coronavirus OC43 NOT DETECTED NOT DETECTED Final   Metapneumovirus NOT DETECTED NOT DETECTED Final   Rhinovirus / Enterovirus NOT DETECTED NOT DETECTED Final   Influenza A NOT DETECTED NOT DETECTED Final   Influenza B NOT DETECTED NOT DETECTED Final   Parainfluenza Virus 1 NOT DETECTED NOT DETECTED Final   Parainfluenza Virus 2 NOT DETECTED NOT DETECTED Final   Parainfluenza Virus 3 NOT DETECTED NOT DETECTED Final   Parainfluenza Virus 4 NOT DETECTED NOT DETECTED Final   Respiratory Syncytial Virus NOT DETECTED NOT DETECTED Final   Bordetella pertussis NOT DETECTED NOT DETECTED Final   Bordetella Parapertussis NOT DETECTED NOT DETECTED Final   Chlamydophila pneumoniae NOT DETECTED NOT DETECTED Final   Mycoplasma pneumoniae NOT DETECTED NOT DETECTED Final    Comment: Performed at [facility], [place]. [place],  [place] [address]    Time spent: 65 minutes  Signed: Debbe Odea, MD 02-Sep-2022

## 2022-08-31 NOTE — Progress Notes (Signed)
ANTICOAGULATION CONSULT NOTE - Initial Consult  Pharmacy Consult for Heparin Indication: chest pain/ACS  Allergies  Allergen Reactions   Sulfa Drugs Cross Reactors Hives and Rash   Ceclor [Cefaclor] Hives    Tolerated ceftazidime December 2016   Depakote Er [Divalproex Sodium Er] Other (See Comments)    Patient remarked that when she was taking this TWICE a day, she became "clumsy" and felt more prone to stumbling   Escitalopram Oxalate Other (See Comments)    Pt does not recall ever taking medication (??)   Latex Itching   Other Other (See Comments)    Hydrocort Cream  Unknown   Sertraline Hcl Other (See Comments)    Severe headaches    Tape Itching and Rash    Band-Aids, also (PAPER TAPE IS TOLERATED)    Patient Measurements: Height: 5\' 3"  (160 cm) Weight: 41 kg (90 lb 6.2 oz) IBW/kg (Calculated) : 52.4 Heparin Dosing Weight: total body weight  Vital Signs: Temp: 97.2 F (36.2 C) (09/03 1934) Temp Source: Axillary (09/03 1934) BP: 113/69 (09/03 1820) Pulse Rate: 116 (09/03 1830)  Labs: Recent Labs    08/13/2022 1225  HGB 15.6*  HCT 48.5*  PLT 143*  CREATININE 0.71    Estimated Creatinine Clearance: 41.7 mL/min (by C-G formula based on SCr of 0.71 mg/dL).   Medical History: Past Medical History:  Diagnosis Date   Anxiety    Arthritis    "joints" (09/18/2017)   Asthma    Bipolar I disorder ([place]) 10/11/2019   CAP (community acquired pneumonia) 09/18/2017   Cat allergies    Chronic bronchitis ([place])    "get it alot; maybe not q yr" (07/06/2014)   Chronic lower back pain    "spurs and pinched nerves" (09/18/2017)   COPD (chronic obstructive pulmonary disease) ([place])    DDD (degenerative disc disease), lumbar    Depression    psychiatrist Dr. Toy Care   Diastolic dysfunction    Dyspnea    Emphysematous COPD ([place])    changes in right base along with patchy areas on last x-ray   Fall as cause of accidental injury at home as place of occurrence 10/11/2019    occasional use of walker   Fall involving wheelchair as cause of accidental injury 10/11/2019   Falls frequently    "several in 2017; fell at dr's office yesterday" (09/18/2017)   GERD (gastroesophageal reflux disease)    HCAP (healthcare-associated pneumonia) 02/12/2016   Heart murmur    History of cardiovascular stress test 08/15/2004   EF of 70% -- Normal stress cardiolite.  There is no evidence of ischemia and there is normal LV function. -- [name]. MD   History of echocardiogram 12/24/2006   Est. EF of 39-76% NORMAL LV SYSTOLIC FUNCTION WITH IMPAIRED RELAXATION -- MILD AORTIC SCLEROSIS -- NORMAL PALONARY ARTERY PRESSURE -- NO OLD ECHOS FOR COMPARISON -- [name], MD   Hypertension    essential hypertension   Migraine    "when I was having my periods; none since then" (09/18/2017)   Necrotic pneumonia ([place])    recurrent/notes 02/12/2016   Pneumonia    "several times since May 2015" (07/06/2014)   Pollen allergies    Tobacco abuse    smoking about 1/2 pk of cigarettes a day   Tobacco dependence due to cigarettes 10/11/2019   Traumatic hematoma of knee, right, initial encounter 10/11/2019    Medications:  No oral anticoagulation PTA Patient ordered Lovenox 30mg  sq q24h for VTE prophylaxis on admission - received  one dose on 08/06/2022 @ 21:59  Assessment: 71 yr female admitted with sepsis secondary to RUL PNA.  PMH significant for lung cancer.  Patient known to pharmacy from antibiotic dosing. IV heparin to begin for ACS/STEMI  Goal of Therapy:  Heparin level 0.3-0.7 units/ml Monitor platelets by anticoagulation protocol: Yes   Plan:  No heparin bolus as patient recently received Lovenox 30mg  sq x 1 dose Begin heparin gtt @ 500 units/hr Check heparin level 8 hr after heparin gtt started Daily heparin level & CBC  Abreanna Drawdy, Toribio Harbour, PharmD August 11, 2022,12:43 AM

## 2022-08-31 NOTE — Progress Notes (Signed)
CPT held at this time.  

## 2022-08-31 NOTE — Consult Note (Addendum)
CARDIOLOGY CONSULT NOTE  Patient ID: Catherine Kelley MRN: [id number] DOB/AGE: 72-Jul-1952 72 y.o.  Admit date: 08/22/2022 Referring Physician: Gean Birchwood, MD Primary Physician:  Maximiano Coss, NP Reason for Consultation  STEMI  Patient ID: Catherine Kelley, female    DOB: 1951/07/19, 71 y.o.   MRN: [id number]  Chief Complaint  Patient presents with   Abdominal Pain   leg infection    Shortness of Breath   HPI:    Catherine Kelley  is a 71 y.o. female patient with stage IV adenocarcinoma of the lung, COPD with bronchiectasis, bipolar disorder, primary hypertension, tobacco use disorder, admitted to the hospital with poor oral intake, abdominal discomfort and marked dyspnea on exertion.  She received 1 dose of chemotherapy last week.  Since then has not been feeling well.  She completed radiation therapy.  Now being also treated for community-acquired pneumonia.  While patient was on telemetry, noted to have ST elevation and hence EKG was obtained which revealed inferior STEMI with reciprocal ST depression in anterior leads.  I was asked to provide regarding further management.  She was started on IV heparin and an echocardiogram was ordered.  Patient had not complained of any chest pain until this morning, presently patient is moaning and groaning, responds very little with regard to verbal questions but does admit to having severe pain in her chest.  Patient's 2 daughters and husband present at the bedside.  Past Medical History:  Diagnosis Date   Anxiety    Arthritis    "joints" (09/18/2017)   Asthma    Bipolar I disorder ([place]) 10/11/2019   CAP (community acquired pneumonia) 09/18/2017   Cat allergies    Chronic bronchitis ([place])    "get it alot; maybe not q yr" (07/06/2014)   Chronic lower back pain    "spurs and pinched nerves" (09/18/2017)   COPD (chronic obstructive pulmonary disease) ([place])    DDD (degenerative disc disease), lumbar    Depression    psychiatrist Dr. Toy Care    Diastolic dysfunction    Dyspnea    Emphysematous COPD ([place])    changes in right base along with patchy areas on last x-ray   Fall as cause of accidental injury at home as place of occurrence 10/11/2019   occasional use of walker   Fall involving wheelchair as cause of accidental injury 10/11/2019   Falls frequently    "several in 2017; fell at dr's office yesterday" (09/18/2017)   GERD (gastroesophageal reflux disease)    HCAP (healthcare-associated pneumonia) 02/12/2016   Heart murmur    History of cardiovascular stress test 08/15/2004   EF of 70% -- Normal stress cardiolite.  There is no evidence of ischemia and there is normal LV function. -- [name]. MD   History of echocardiogram 12/24/2006   Est. EF of 61-95% NORMAL LV SYSTOLIC FUNCTION WITH IMPAIRED RELAXATION -- MILD AORTIC SCLEROSIS -- NORMAL PALONARY ARTERY PRESSURE -- NO OLD ECHOS FOR COMPARISON -- [name], MD   Hypertension    essential hypertension   Migraine    "when I was having my periods; none since then" (09/18/2017)   Necrotic pneumonia ([place])    recurrent/notes 02/12/2016   Pneumonia    "several times since May 2015" (07/06/2014)   Pollen allergies    Tobacco abuse    smoking about 1/2 pk of cigarettes a day   Tobacco dependence due to cigarettes 10/11/2019   Traumatic hematoma of knee, right, initial encounter 10/11/2019   Past Surgical  History:  Procedure Laterality Date   BIOPSY  04/28/2022   Procedure: BIOPSY;  Surgeon: Carol Ada, MD;  Location: [facility] ENDOSCOPY;  Service: Gastroenterology;;   BRONCHIAL BIOPSY  06/09/2022   Procedure: BRONCHIAL BIOPSIES;  Surgeon: [name], MD;  Location: [facility] ENDOSCOPY;  Service: Pulmonary;;   BRONCHIAL BRUSHINGS  06/09/2022   Procedure: BRONCHIAL BRUSHINGS;  Surgeon: [name], MD;  Location: [facility] ENDOSCOPY;  Service: Pulmonary;;   BRONCHIAL NEEDLE ASPIRATION BIOPSY  06/09/2022   Procedure: BRONCHIAL NEEDLE ASPIRATION BIOPSIES;  Surgeon: [name], MD;  Location: [facility];  Service: Pulmonary;;   BRONCHIAL WASHINGS  06/09/2022   Procedure: BRONCHIAL WASHINGS;  Surgeon: [name], MD;  Location: [facility] ENDOSCOPY;  Service: Pulmonary;;   CAROTID STENT     COLONOSCOPY N/A 04/28/2022   Procedure: COLONOSCOPY;  Surgeon: Carol Ada, MD;  Location: [facility] ENDOSCOPY;  Service: Gastroenterology;  Laterality: N/A;   DILATION AND CURETTAGE OF UTERUS     FOREIGN BODY REMOVAL  04/28/2022   Procedure: FOREIGN BODY REMOVAL;  Surgeon: Carol Ada, MD;  Location: [facility] ENDOSCOPY;  Service: Gastroenterology;;  Fecal Impaction Removed   TUBAL LIGATION  1986   VIDEO BRONCHOSCOPY WITH ENDOBRONCHIAL ULTRASOUND N/A 06/09/2022   Procedure: VIDEO BRONCHOSCOPY WITH ENDOBRONCHIAL ULTRASOUND;  Surgeon: [name], MD;  Location: [facility] ENDOSCOPY;  Service: Pulmonary;  Laterality: N/A;   VIDEO BRONCHOSCOPY WITH RADIAL ENDOBRONCHIAL ULTRASOUND  06/09/2022   Procedure: VIDEO BRONCHOSCOPY WITH RADIAL ENDOBRONCHIAL ULTRASOUND;  Surgeon: [name], MD;  Location: [facility] ENDOSCOPY;  Service: Pulmonary;;   Social History   Tobacco Use   Smoking status: Every Day    Packs/day: 0.75    Years: 51.00    Total pack years: 38.25    Types: Cigarettes   Smokeless tobacco: Never  Substance Use Topics   Alcohol use: Yes    Alcohol/week: 1.0 standard drink of alcohol    Types: 1 Glasses of wine per week    Comment: 09/18/2017 "might have 1 drink/month; if that"    Family History  Problem Relation Age of Onset   Stroke Mother    Heart disease Father    Hyperlipidemia Father    Hypertension Father    Cancer Maternal Aunt        breast cancer   Breast cancer Maternal Aunt    Asthma Maternal Grandmother     Marital Status: Married  ROS  Review of Systems  Cardiovascular:  Positive for chest pain and dyspnea on exertion. Negative for leg swelling.  Gastrointestinal:  Positive for abdominal pain.   Objective      August 20, 2022    7:39 AM 20-Aug-2022    7:00  AM 08-20-2022    6:00 AM  Vitals with BMI  Systolic 92 939 030  Diastolic 62 73 61  Pulse  102 101    Blood pressure 92/62, pulse (!) 102, temperature 98.3 F (36.8 C), temperature source Oral, resp. rate (!) 24, height 5\' 3"  (1.6 m), weight 41 kg, SpO2 100 %.    Physical Exam Constitutional:      General: She is not in acute distress.    Appearance: She is cachectic.     Comments: Ill appearing and wasted  Neck:     Vascular: No carotid bruit or JVD.  Cardiovascular:     Rate and Rhythm: Normal rate and regular rhythm.     Pulses: Intact distal pulses.     Heart sounds: Normal heart sounds. No murmur heard.  No gallop.  Pulmonary:     Effort: Tachypnea present.     Breath sounds: Examination of the right-middle field reveals decreased breath sounds. Examination of the left-middle field reveals decreased breath sounds. Examination of the right-lower field reveals decreased breath sounds. Examination of the left-lower field reveals decreased breath sounds. Decreased breath sounds and rales (scattered) present.  Abdominal:     General: Bowel sounds are normal.     Palpations: Abdomen is soft.  Musculoskeletal:     Right lower leg: No edema.     Left lower leg: No edema.  Neurological:     Mental Status: She is easily aroused. She is lethargic.    Laboratory examination:   Recent Labs    07/28/22 0811 08/29/2022 1225 08/09/22 0105  NA 127* 128* 132*  K 4.3 5.0 4.9  CL 94* 91* 99  CO2 27 21* 21*  GLUCOSE 124* 180* 102*  BUN 9 19 22   CREATININE <0.30* 0.71 0.80  CALCIUM 9.6 9.2 8.8*  GFRNONAA NOT CALCULATED >60 >60   estimated creatinine clearance is 41.7 mL/min (by C-G formula based on SCr of 0.8 mg/dL).     Latest Ref Rng & Units August 09, 2022    1:05 AM 08/21/2022   12:25 PM 07/28/2022    8:11 AM  CMP  Glucose 70 - 99 mg/dL 102  180  124   BUN 8 - 23 mg/dL 22  19  9    Creatinine 0.44 - 1.00 mg/dL 0.80  0.71  <0.30   Sodium 135 - 145 mmol/L 132  128  127   Potassium  3.5 - 5.1 mmol/L 4.9  5.0  4.3   Chloride 98 - 111 mmol/L 99  91  94   CO2 22 - 32 mmol/L 21  21  27    Calcium 8.9 - 10.3 mg/dL 8.8  9.2  9.6   Total Protein 6.5 - 8.1 g/dL 4.5  6.5  6.9   Total Bilirubin 0.3 - 1.2 mg/dL 1.2  1.6  0.7   Alkaline Phos 38 - 126 U/L 87  150  184   AST 15 - 41 U/L 36  32  22   ALT 0 - 44 U/L 18  16  11        Latest Ref Rng & Units 08/09/22    2:58 AM 08/11/2022   12:25 PM 07/28/2022    8:11 AM  CBC  WBC 4.0 - 10.5 K/uL 2.7  3.3  15.3   Hemoglobin 12.0 - 15.0 g/dL 14.7  15.6  14.0   Hematocrit 36.0 - 46.0 % 45.9  48.5  41.9   Platelets 150 - 400 K/uL PLATELET CLUMPS NOTED ON SMEAR, UNABLE TO ESTIMATE  143  183    Lipid Panel Recent Labs    09/26/21 1145  CHOL 161  TRIG 111.0  LDLCALC 75  VLDL 22.2  HDL 63.40  CHOLHDL 3    HEMOGLOBIN A1C Lab Results  Component Value Date   HGBA1C 5.1 09/26/2021   MPG 91.06 11/08/2020   TSH Recent Labs    04/24/22 1020 07/28/22 0811  TSH 6.181* 2.736   BNP (last 3 results) No results for input(s): "BNP" in the last 8760 hours.  Cardiac Panel (last 3 results) Recent Labs    August 09, 2022 0105 08-09-2022 0258 Aug 09, 2022 0644  TROPONINIHS 616* 1,309* 2,239*     Medications and allergies   Allergies  Allergen Reactions   Sulfa Drugs Cross Reactors Hives and Rash   Ceclor [Cefaclor] Hives  Tolerated ceftazidime December 2016   Depakote Er [Divalproex Sodium Er] Other (See Comments)    Patient remarked that when she was taking this TWICE a day, she became "clumsy" and felt more prone to stumbling   Escitalopram Oxalate Other (See Comments)    Pt does not recall ever taking medication (??)   Latex Itching   Other Other (See Comments)    Hydrocort Cream  Unknown   Sertraline Hcl Other (See Comments)    Severe headaches    Tape Itching and Rash    Band-Aids, also (PAPER TAPE IS TOLERATED)     Current Meds  Medication Sig   albuterol (VENTOLIN HFA) 108 (90 Base) MCG/ACT inhaler Inhale 2 puffs  into the lungs every 6 (six) hours as needed for shortness of breath.   amLODipine (NORVASC) 2.5 MG tablet Take 1 tablet (2.5 mg total) by mouth daily.   folic acid (FOLVITE) 1 MG tablet Take 1 tablet (1 mg total) by mouth daily.   lithium carbonate (LITHOBID) 300 MG CR tablet Take 600 mg in am and 300 at bed time.   nortriptyline (PAMELOR) 50 MG capsule Take 1 capsule (50 mg total) by mouth at bedtime.   oxyCODONE-acetaminophen (PERCOCET/ROXICET) 5-325 MG tablet Take 1 tablet by mouth every 8 (eight) hours as needed for severe pain.   polyethylene glycol (MIRALAX / GLYCOLAX) 17 g packet Take 17 g by mouth daily.   prochlorperazine (COMPAZINE) 10 MG tablet Take 1 tablet (10 mg total) by mouth every 6 (six) hours as needed.   senna (SENOKOT) 8.6 MG TABS tablet Take 2 tablets (17.2 mg total) by mouth 2 (two) times daily.   thiamine 100 MG tablet Take 1 tablet (100 mg total) by mouth daily.   venlafaxine (EFFEXOR) 75 MG tablet Take 1 tablet (75 mg total) by mouth daily.   Vitamin D, Ergocalciferol, (DRISDOL) 1.25 MG (50000 UNIT) CAPS capsule TAKE 1 CAPSULE (50,000 UNITS TOTAL) BY MOUTH EVERY 7 (SEVEN) DAYS    Scheduled Meds:  aspirin  325 mg Oral Daily   atropine       bisacodyl  10 mg Rectal Once   Chlorhexidine Gluconate Cloth  6 each Topical Daily   feeding supplement  237 mL Oral BID BM   lithium carbonate  600 mg Oral q morning   And   lithium carbonate  300 mg Oral QHS   phenylephrine       venlafaxine  75 mg Oral Daily   Continuous Infusions:  ceFEPime (MAXIPIME) IV Stopped (08/30/22 0029)   heparin 500 Units/hr (30-Aug-2022 0128)   lactated ringers 150 mL/hr at 08/30/2022 0045   metronidazole     vancomycin     PRN Meds:.acetaminophen **OR** acetaminophen, atropine, ipratropium-albuterol, morphine injection, mouth rinse, oxyCODONE-acetaminophen, phenylephrine   I/O last 3 completed shifts: In: 2335 [I.V.:0.6; IV Piggyback:2334.5] Out: -  No intake/output data recorded.     Radiology:   DG Chest Portable 1 View 08/10/2022 CLINICAL DATA:  Shortness of breath. Lung infection and abdominal pain. Abnormal PET scan and chest CTs. EXAM: PORTABLE CHEST 1 VIEW COMPARISON:  CT chest 08/17/2022 and 06/06/2022. FINDINGS: The heart size is normal. Atherosclerotic calcifications are present at the aortic arch. Diffuse interstitial changes are again noted. Masslike densities bilaterally are better described on CT. Most prominent areas in the right upper lobe. Bilateral bronchiectasis is better seen in the lower lobes. IMPRESSION: 1. Bilateral masses are better described on CT. Given assistant appearance and avid uptake on PET scan, these remain concerning  for neoplasm. 2. Diffuse interstitial changes with bilateral bronchiectasis. 3. Aortic atherosclerosis. 4. No acute cardiopulmonary disease or significant interval change compared to the recent CT. Electronically Signed   By: San Morelle M.D.   On: 08/09/2022 14:13   Cardiac Studies:   Echocardiogram 09/18/2017: - Left ventricle: The cavity size was normal. Wall thickness was    normal. Systolic function was normal. The estimated ejection    fraction was in the range of 60% to 65%. Wall motion was normal;    there were no regional wall motion abnormalities. Doppler    parameters are consistent with abnormal left ventricular    relaxation (grade 1 diastolic dysfunction).  - Mitral valve: Calcified annulus.   Study just performed, pending not uploaded  EKG: EKG Aug 26, 2022: Complete heart block, ventricular escape 48 bpm, inferior and lateral STEMI.  EKG 08-26-22 at 12:04 AM: Underlying sinus tachycardia with 2: 1 AV conduction.  ST elevation in the inferior leads and lateral leads with reciprocal ST depression in V1 and V2, acute inferolateral STEMI.   07/20/2022, sinus tachycardia at rate of 115 bpm with nonspecific ST-T changes.  Assessment   1.  Acute inferior STEMI 2.  Complete heart block 3.  Metastatic lung  cancer stage IV  Recommendations:   Catherine Kelley  is a 71 y.o. female patient with stage IV adenocarcinoma of the lung, COPD with bronchiectasis, bipolar disorder, primary hypertension, tobacco use disorder, admitted to the hospital with poor oral intake, abdominal discomfort and marked dyspnea on exertion.  She received 1 dose of chemotherapy last week.  Since then has not been feeling well.  She completed radiation therapy.  Now being also treated for community-acquired pneumonia.  While patient was on telemetry, noted to have ST elevation and hence EKG was obtained which revealed inferior STEMI with reciprocal ST depression in anterior leads.  I was asked to provide regarding further management.  She was started on IV heparin and an echocardiogram was ordered.  Extensive review of her chart, patient's daughters (2) and husband present at the bedside, they are very frustrated and very saddened to see her mother suffer.  They do not think that she will be able to handle any further investigations and/or cancer therapy.  As she is moaning and groaning, they are wondering if her pain can be subdued.  I held a long discussion with the patient's family regarding complete heart block, myocardial infarction, not a candidate for any invasive strategy, with ongoing chest discomfort, mild respiratory distress, patient appears wasted and in distress, best option is to proceed with comfort measures only.  Patient's husband states that it is the best option.  Patient's 2 daughters at the bedside.  Also agree with this, they would like to try to speak to her mother as they have not been able to verbalize with her as she is relatively obtunded.  Once they are ready for this, we will start her on a morphine drip for comfort.  I have discontinued all the medications and labs.  I discussed with Dr. Debbe Odea who is also in agreement for comfort measures.  I have communicated this with nursing staff.  I spent a total  of 45 minutes in review of the chart and making decisions regarding management of coronary artery disease, STEMI, heart block and additional 20 minutes with coordination of care with regard to end-of-life matters.   Adrian Prows, MD, [facility] 08-26-22, 9:11 AM Office: [phone]

## 2022-08-31 NOTE — Progress Notes (Signed)
ANTICOAGULATION CONSULT NOTE - Initial Consult  Pharmacy Consult for Heparin Indication: chest pain/ACS  Allergies  Allergen Reactions   Sulfa Drugs Cross Reactors Hives and Rash   Ceclor [Cefaclor] Hives    Tolerated ceftazidime December 2016   Depakote Er [Divalproex Sodium Er] Other (See Comments)    Patient remarked that when she was taking this TWICE a day, she became "clumsy" and felt more prone to stumbling   Escitalopram Oxalate Other (See Comments)    Pt does not recall ever taking medication (??)   Latex Itching   Other Other (See Comments)    Hydrocort Cream  Unknown   Sertraline Hcl Other (See Comments)    Severe headaches    Tape Itching and Rash    Band-Aids, also (PAPER TAPE IS TOLERATED)    Patient Measurements: Height: 5\' 3"  (160 cm) Weight: 41 kg (90 lb 6.2 oz) IBW/kg (Calculated) : 52.4 Heparin Dosing Weight: total body weight  Vital Signs: Temp: 98.3 F (36.8 C) (09/04 0800) Temp Source: Oral (09/04 0800) BP: 98/80 (09/04 1115) Pulse Rate: 40 (09/04 0933)  Labs: Recent Labs    08/14/2022 1225 08/16/22 0105 08/16/22 0105 August 16, 2022 0258 Aug 16, 2022 0644 08-16-2022 1011  HGB 15.6*  --   --  14.7  --   --   HCT 48.5*  --   --  45.9  --   --   PLT 143*  --   --  PLATELET CLUMPS NOTED ON SMEAR, UNABLE TO ESTIMATE  --   --   LABPROT  --  20.3*  --   --   --   --   INR  --  1.8*  --   --   --   --   HEPARINUNFRC  --   --   --   --   --  <0.10*  CREATININE 0.71 0.80  --   --   --   --   TROPONINIHS  --  616*   < > 1,309* 2,239* 3,351*   < > = values in this interval not displayed.     Estimated Creatinine Clearance: 41.7 mL/min (by C-G formula based on SCr of 0.8 mg/dL).  Medications:  No oral anticoagulation PTA Patient ordered Lovenox 30mg  sq q24h for VTE prophylaxis on admission - received one dose on 08/11/2022 @ 21:59  Assessment: 71 yr female admitted with sepsis secondary to RUL PNA.  PMH significant for lung cancer.  Pharmacy consulted IV  heparin to begin for ACS/STEMI.  08/16/22 First heparin level undetectable - confirmed with RN that heparin drip has not been paused or interrupted CBC: Hgb WNL, Plts clumped RN notes +bruising on arms, likely from lab draws  Goal of Therapy:  Heparin level 0.3-0.7 units/ml Monitor platelets by anticoagulation protocol: Yes   Plan:  Heparin 2000 units IV bolus Increase heparin gtt to 650 units/hr Check heparin level 8 hr after heparin gtt increased Daily heparin level & CBC  Peggyann Juba, PharmD, BCPS Pharmacy: [phone] 2022/08/16,11:52 AM

## 2022-08-31 NOTE — Progress Notes (Signed)
Nutrition Brief Note  Chart reviewed. Spoke with MD. Pt now transitioning to comfort care.  No further nutrition interventions planned at this time.  Please re-consult as needed.   Clayborne Dana, RDN, LDN Clinical Nutrition

## 2022-08-31 NOTE — Progress Notes (Signed)
MD made aware of critical troponin

## 2022-08-31 NOTE — Progress Notes (Signed)
  Echocardiogram 2D Echocardiogram has been performed.  Catherine Kelley 08/10/22, 10:33 AM

## 2022-08-31 NOTE — Progress Notes (Signed)
I was called about ST elevation in the inferior leads and reciprocal ST depression in V1 V2 suggestive of acute STEMI, EKG evaluation reveals 2-1 AV block, inferior STEMI, patient extremely ill coming with sepsis, history of lung cancer, very frail.  Presently 71 years of age.  No chest pain, has chronic dyspnea.  Discussed with Dr. Hal Hope, patient will be started on IV heparin and aspirin, medical management for now.  Trend troponins and obtain EKG and echocardiogram in the morning.

## 2022-08-31 NOTE — Progress Notes (Signed)
[facility] Progress Note  Patient: Catherine Kelley     BOF:[id number]  DOA: 08/25/2022   PCP: Maximiano Coss, NP       Brief hospital course: This is a 71 year old female with adenocarcinoma of the lung, metastatic, bronchiectasis, hypertension presented to the hospital for shortness of breath, abdominal pain nausea and constipation.  Pulse ox noted to be 89% on room air in triage. The ED: Noted to be tachycardic with increased work of breathing, tenderness throughout the abdomen. CT chest abdomen pelvis: Showed bilateral bronchiectasis mass in the right lower lobe, right upper lobe and left lower lobe tree-in-bud opacities with underlying changes consistent with emphysema.  Cirrhotic change of the liver was also noted. Lactic acid was 6.6, WBC count 3.3, sodium 128, bicarb 21 and platelets slightly low at 143. Influenza and COVID-negative She was given cefepime vancomycin and Flagyl along with IV fluids.  She was subsequently found to have a STEMI without chest pain.  ST elevations were noted in the inferior leads.  Cardiology was consulted.  She was also placed on heparin infusion.  Subjective:  Complaining of pain in abdomen. Very restless and not able to give a history. Assessment and Plan: Active Problems:  Principal Problem:   STEMI (ST elevation myocardial infarction) ([place]) with cardiogenic shock - likely inferior wall - Troponin 3,351 - Lactic acid 2.9 when last checked - Heparin infusion - ECHO pending - 2 L bolus given in ED and now on 150 cc/hr - per RN urine output is poor - place foley for accurate I and O- aggressive hydration  Active Problems: AV block - due to above - HR in 40s per RN alternating with sinus tach  Abdominal pain - ? If this is an anginal equivalent for her-   Metastatic adenocarcinoma of lung - sp first chemo last week - per family she has not tolerated this well and has had vomiting   Severely underweight/ cachexia  Body mass index  is 16.01 kg/m.  Leukopenia and mild thrombocytopenia - related to chemo     Have discussed [place] with husband and daughter about the gravity of her condition and poor prognosis. She will be DNR per our discussion.    DVT prophylaxis:  Heparin infusion   Code Status: DNR  Consultants: cardiology Level of Care: Level of care: Stepdown Disposition Plan:  Status is: Inpatient Remains inpatient appropriate because: STEMI  Objective:   Vitals:   2022/08/29 0933 08-29-22 1100 August 29, 2022 1115 2022/08/29 1130  BP: 101/60 106/73 98/80   Pulse: (!) 40     Resp: (!) 25 16  (!) 23  Temp:      TempSrc:      SpO2: 90% 90%  (!) 86%  Weight:      Height:       Filed Weights   08/20/2022 1630 08/13/2022 1820  Weight: 36.3 kg 41 kg   Exam: General exam: Appears very uncomfortable and confused, moaning in pain, cachectic Respiratory system: Clear to auscultation.  Cardiovascular system: S1 & S2 heard  Gastrointestinal system: Abdomen soft, distended, very tender to touch, Normal bowel sounds   Extremities: No cyanosis, clubbing or edema    Imaging and lab data was personally reviewed    CBC: Recent Labs  Lab 08/01/2022 1225 2022-08-29 0258  WBC 3.3* 2.7*  NEUTROABS 2.3  --   HGB 15.6* 14.7  HCT 48.5* 45.9  MCV 90.5 90.7  PLT 143* PLATELET CLUMPS NOTED ON SMEAR, UNABLE TO ESTIMATE   Basic Metabolic  Panel: Recent Labs  Lab 08/13/2022 1225 Aug 24, 2022 0105  NA 128* 132*  K 5.0 4.9  CL 91* 99  CO2 21* 21*  GLUCOSE 180* 102*  BUN 19 22  CREATININE 0.71 0.80  CALCIUM 9.2 8.8*   GFR: Estimated Creatinine Clearance: 41.7 mL/min (by C-G formula based on SCr of 0.8 mg/dL).  Scheduled Meds:  aspirin  325 mg Oral Daily   atropine       Chlorhexidine Gluconate Cloth  6 each Topical Daily   feeding supplement  237 mL Oral BID BM   lithium carbonate  600 mg Oral q morning   And   lithium carbonate  300 mg Oral QHS   phenylephrine       venlafaxine  75 mg Oral Daily   Continuous  Infusions:  ceFEPime (MAXIPIME) IV Stopped (2022/08/24 1013)   heparin 500 Units/hr (08/24/22 0943)   metronidazole Stopped (August 24, 2022 1142)   vancomycin       LOS: 1 day   Author: Debbe Odea  24-Aug-2022 11:32 AM

## 2022-08-31 NOTE — Progress Notes (Signed)
Pt placed on comfort care per MD orders. Pt asystole on monitor at 1502. No spontaneous respirations.  Confirmed with Josph Macho RN and Farrell Ours RN. Family at bedside, belongings returned.

## 2022-08-31 DEATH — deceased

## 2022-09-01 ENCOUNTER — Other Ambulatory Visit: Payer: Medicare PPO

## 2022-09-08 ENCOUNTER — Ambulatory Visit: Payer: Medicare PPO

## 2022-09-08 ENCOUNTER — Other Ambulatory Visit: Payer: Medicare PPO

## 2022-09-08 ENCOUNTER — Ambulatory Visit: Payer: Medicare PPO | Admitting: Internal Medicine

## 2022-09-15 ENCOUNTER — Other Ambulatory Visit: Payer: Medicare PPO

## 2022-09-22 ENCOUNTER — Other Ambulatory Visit: Payer: Medicare PPO

## 2022-09-29 ENCOUNTER — Ambulatory Visit: Payer: Medicare PPO | Admitting: Physician Assistant

## 2022-09-29 ENCOUNTER — Other Ambulatory Visit: Payer: Medicare PPO

## 2022-09-29 ENCOUNTER — Ambulatory Visit: Payer: Medicare PPO
